# Patient Record
Sex: Female | Born: 1965 | Race: White | Hispanic: No | Smoking: Current every day smoker
Health system: Southern US, Community
[De-identification: ages and names within clinical notes are randomized; demographics above are authoritative.]

## PROBLEM LIST (undated history)

## (undated) ENCOUNTER — Emergency Department (HOSPITAL_COMMUNITY): Discharge status: Absent without leave | Payer: Self-pay

## (undated) DIAGNOSIS — R112 Nausea with vomiting, unspecified: Secondary | ICD-10-CM

## (undated) DIAGNOSIS — R519 Headache, unspecified: Secondary | ICD-10-CM

## (undated) DIAGNOSIS — K589 Irritable bowel syndrome without diarrhea: Secondary | ICD-10-CM

## (undated) DIAGNOSIS — I959 Hypotension, unspecified: Secondary | ICD-10-CM

## (undated) DIAGNOSIS — M1990 Unspecified osteoarthritis, unspecified site: Secondary | ICD-10-CM

## (undated) DIAGNOSIS — F419 Anxiety disorder, unspecified: Secondary | ICD-10-CM

## (undated) DIAGNOSIS — G8929 Other chronic pain: Secondary | ICD-10-CM

## (undated) DIAGNOSIS — F32A Depression, unspecified: Secondary | ICD-10-CM

## (undated) DIAGNOSIS — F329 Major depressive disorder, single episode, unspecified: Secondary | ICD-10-CM

## (undated) DIAGNOSIS — R0600 Dyspnea, unspecified: Secondary | ICD-10-CM

## (undated) DIAGNOSIS — I341 Nonrheumatic mitral (valve) prolapse: Secondary | ICD-10-CM

## (undated) DIAGNOSIS — M549 Dorsalgia, unspecified: Secondary | ICD-10-CM

## (undated) HISTORY — DX: Unspecified osteoarthritis, unspecified site: M19.90

## (undated) HISTORY — PX: TUBAL LIGATION: SHX77

## (undated) HISTORY — DX: Hypotension, unspecified: I95.9

## (undated) HISTORY — DX: Major depressive disorder, single episode, unspecified: F32.9

## (undated) HISTORY — DX: Depression, unspecified: F32.A

## (undated) HISTORY — DX: Nonrheumatic mitral (valve) prolapse: I34.1

## (undated) HISTORY — DX: Other chronic pain: G89.29

## (undated) HISTORY — DX: Anxiety disorder, unspecified: F41.9

## (undated) HISTORY — DX: Nausea with vomiting, unspecified: R11.2

## (undated) HISTORY — DX: Dorsalgia, unspecified: M54.9

---

## 2005-05-19 ENCOUNTER — Emergency Department: Payer: Self-pay | Admitting: Emergency Medicine

## 2006-03-16 ENCOUNTER — Emergency Department: Payer: Self-pay | Admitting: Emergency Medicine

## 2006-07-07 ENCOUNTER — Emergency Department: Payer: Self-pay | Admitting: Emergency Medicine

## 2006-07-16 ENCOUNTER — Ambulatory Visit: Payer: Self-pay | Admitting: Internal Medicine

## 2006-07-31 ENCOUNTER — Ambulatory Visit: Payer: Self-pay | Admitting: Vascular Surgery

## 2006-08-19 ENCOUNTER — Ambulatory Visit: Payer: Self-pay | Admitting: Vascular Surgery

## 2007-05-11 ENCOUNTER — Ambulatory Visit: Payer: Self-pay | Admitting: Gastroenterology

## 2007-07-19 ENCOUNTER — Emergency Department: Payer: Self-pay

## 2009-05-21 ENCOUNTER — Emergency Department: Payer: Self-pay | Admitting: Emergency Medicine

## 2009-11-17 ENCOUNTER — Ambulatory Visit: Payer: Self-pay | Admitting: Family Medicine

## 2011-07-14 ENCOUNTER — Emergency Department: Payer: Self-pay | Admitting: Emergency Medicine

## 2012-10-07 ENCOUNTER — Emergency Department (HOSPITAL_COMMUNITY)
Admission: EM | Admit: 2012-10-07 | Discharge: 2012-10-07 | Disposition: A | Discharge status: Home / Self Care | Payer: Self-pay | Source: Home / Self Care

## 2012-10-07 ENCOUNTER — Encounter (HOSPITAL_COMMUNITY): Payer: Self-pay

## 2012-10-07 DIAGNOSIS — M549 Dorsalgia, unspecified: Secondary | ICD-10-CM

## 2012-10-07 DIAGNOSIS — M25512 Pain in left shoulder: Secondary | ICD-10-CM

## 2012-10-07 DIAGNOSIS — G8929 Other chronic pain: Secondary | ICD-10-CM

## 2012-10-07 DIAGNOSIS — F17200 Nicotine dependence, unspecified, uncomplicated: Secondary | ICD-10-CM

## 2012-10-07 DIAGNOSIS — M25571 Pain in right ankle and joints of right foot: Secondary | ICD-10-CM

## 2012-10-07 MED ORDER — BETAMETHASONE DIPROPIONATE 0.05 % EX CREA: TOPICAL_CREAM | Freq: Two times a day (BID) | CUTANEOUS | Status: DC

## 2012-10-07 MED ORDER — DICLOFENAC SODIUM 1 % TD GEL: 2.0000 g | Freq: Four times a day (QID) | TRANSDERMAL | Status: DC

## 2012-10-07 MED ORDER — IBUPROFEN 200 MG PO TABS: 400.0000 mg | ORAL_TABLET | Freq: Four times a day (QID) | ORAL | Status: DC | PRN

## 2012-10-07 NOTE — ED Notes (Signed)
Patient left one of her prescriptions here tried to call into the mental health pharmacy they were already closed for the day.  We call again in the am when they are open

## 2012-10-07 NOTE — ED Provider Notes (Signed)
History     CSN: [id number]  Arrival date & time 10/07/12  1555   First MD Initiated Contact with Patient 10/07/12 1610      Chief Complaint  Patient presents with  . Back Pain  . Wrist Pain  . Generalized Body Aches  . Rash    r shoulder    (Consider location/radiation/quality/duration/timing/severity/associated sxs/prior treatment) HPI 46 year old female with history of depression following at mental health and with symptoms of low back pain, left shoulder pain and bilateral ankle pain for previous dictations. Patient informs that she had stent bilateral ankle fracture from a fall and had cast applied 2 years back. Since then she has been having off and on ankle pains with some x-ray gait difficulty. She also informs off low back pain sustained over a year ago while at the infarct. She also informs having left shoulder pain with difficulty raising it although it up that's ongoing for past one month. She denies any fever, chills, headache, blurry vision, chest pain, palpitations, nausea, vomiting, abdominal pain, shortness of breath, bowel or urinary symptoms. Denies tingling or numbness of the extremities. Denies any weakness of the limbs. She denies taking any medications for the pain.  Special history: History of depression follows at mental health History reviewed. No pertinent past surgical history.  No family history on file.  Social history: smokes 1 pack per of cigarette per day. Denies alcohol use  OB History    Grav Para Term Preterm Abortions TAB SAB Ect Mult Living                  Review of Systems  Allergies  Review of patient's allergies indicates no known allergies.  Home Medications   Current Outpatient Rx  Name  Route  Sig  Dispense  Refill  . DICLOFENAC SODIUM 1 % TD GEL   Topical   Apply 2 g topically 4 (four) times daily.   50 g   2   . IBUPROFEN 200 MG PO TABS   Oral   Take 2 tablets (400 mg total) by mouth every 6 (six) hours as needed for  pain.   30 tablet   0     BP 122/76  Pulse 90  Temp 98.5 F (36.9 C) (Oral)  Resp 20  SpO2 98%  Physical Exam Middle aged female in no acute distress HEENT: No pallor, moist oral mucosa Chest: Clear to sluggish bilaterally CVS: Normal S1 and S2 no murmurs Abdomen: Soft, nontender, nondistended Extremities: Warm, no edema, small macular rash over left shoulder and under the chin Tender to palpation over bilateral ankle joints. No footdrop, normal range of motion. No swelling or edema noted. Mild tenderness to palpation over lower sacral area. No SL RT. Normal range of motion. Patient has some difficulty raising the left arm above the shoulder.: Normal range of motion. No tenderness or swelling foot CNS: AAO x3 ED Course  Procedures (including critical care time)  Labs Reviewed - No data to display No results found.   1. Back pain, chronic   2. Bilateral ankle pain   3. Left shoulder pain   4. Tobacco use disorder    Multiple pain symptoms involving lower back, bilateral ankles and left shoulder obvious duration. Pain is clearly muscular skeletal in nature, most of them being chronic. We'll try and then admitted he and diclofenac gel for symptomatically. Informed on applying heat compress over the area.  Also counseled on smoking cessation. Counseled on exercise and proper posture to  avoid further back injury. Counseled on avoiding lifting heavy weights.  Can follow up in the clinic in 2 weeks if symptoms not improved or worsening.    MDM  Followup in 2 weeks in the clinic if symptoms not improved or worsening or if symptoms progressively worsened or severe she should go to the emergency .        Eddie North, MD 10/07/12 [phone]

## 2012-10-07 NOTE — ED Notes (Signed)
Patient presents with generalized pain from previous bone fractures.  C/O pain in back wrist both legs and has presently gained weight.  Also complains of rash to Right shoulder

## 2012-10-08 NOTE — ED Notes (Signed)
Spoke with chris @ mental health pharmacy [phone] rx for (Diprolene 0.05%) cream  Given verbally via phone conversation

## 2012-10-22 ENCOUNTER — Emergency Department (INDEPENDENT_AMBULATORY_CARE_PROVIDER_SITE_OTHER): Payer: Self-pay

## 2012-10-22 ENCOUNTER — Emergency Department (HOSPITAL_COMMUNITY)
Admission: EM | Admit: 2012-10-22 | Discharge: 2012-10-22 | Disposition: A | Discharge status: Home / Self Care | Payer: Self-pay | Source: Home / Self Care

## 2012-10-22 ENCOUNTER — Encounter (HOSPITAL_COMMUNITY): Payer: Self-pay

## 2012-10-22 DIAGNOSIS — M549 Dorsalgia, unspecified: Secondary | ICD-10-CM

## 2012-10-22 DIAGNOSIS — G8929 Other chronic pain: Secondary | ICD-10-CM

## 2012-10-22 DIAGNOSIS — R21 Rash and other nonspecific skin eruption: Secondary | ICD-10-CM

## 2012-10-22 LAB — TSH: TSH: 2.18 u[IU]/mL (ref 0.350–4.500)

## 2012-10-22 MED ORDER — ACETAMINOPHEN 500 MG PO TABS: 500.0000 mg | ORAL_TABLET | Freq: Four times a day (QID) | ORAL | Status: DC | PRN

## 2012-10-22 MED ORDER — CYCLOBENZAPRINE HCL 5 MG PO TABS: 5.0000 mg | ORAL_TABLET | Freq: Three times a day (TID) | ORAL | Status: DC | PRN

## 2012-10-22 MED ORDER — HYDROCORTISONE 0.5 % EX CREA: TOPICAL_CREAM | Freq: Two times a day (BID) | CUTANEOUS | Status: DC

## 2012-10-22 NOTE — ED Notes (Signed)
Patient states here for follow up for her back pain-ibuprofen is not helping her discomfort

## 2012-10-22 NOTE — ED Provider Notes (Addendum)
Patient Demographics  Karen Edwards, is a 46 y.o. female  ZOX:[id number]  [id number]  DOB - 10/24/1966  Chief Complaint  Patient presents with  . Follow-up      Followup of back pain  Subjective:   Karen Edwards today came for followup of back pain. Patient says that she was sent on ibuprofen and it did not help the back pain she still has the pain which gets better when she lies down and gets worse when she is walking. Patient says that back pain has been present for many years. Patient was prescribed steroid cream before but she did not use for the rash as it was expensive. Patient denies any itching in the rash. She denies bowel or bladder dysfunction  Objective:    Filed Vitals:   10/22/12 1020  BP: 112/68  Pulse: 75  Temp: 98.7 F (37.1 C)  TempSrc: Oral  Resp: 19  SpO2: 99%     Exam  Awake Alert, Oriented X 3, No new F.N deficits, Normal affect [place].AT,PERRAL Supple Neck,No JVD, No cervical lymphadenopathy appriciated.  Symmetrical Chest wall movement, Good air movement bilaterally, CTAB RRR,No Gallops,Rubs or new Murmurs, No Parasternal Heave +ve B.Sounds, Abd Soft, Non tender, No organomegaly appriciated, No rebound - guarding or rigidity. No Cyanosis, Clubbing or edema,  Back: Patient has positive tenderness in the paraspinal region of the lower lumbar spine area. Skin: Patient has a ring shaped erythematous rash in the right shoulder area  Review of Systems  Constitutional: Negative for fever.  Genitourinary: Negative for dysuria, urgency and frequency.  Musculoskeletal: Positive for back pain.  Skin: Positive for rash.  Neurological: Negative for tingling and sensory change.  Him him him him him  Data Review   CBC No results found for this basename: WBC:5,HGB:5,HCT:5,PLT:5,MCV:5,MCH:5,MCHC:5,RDW:5,NEUTRABS:5,LYMPHSABS:5,MONOABS:5,EOSABS:5,BASOSABS:5,BANDABS:5,BANDSABD:5 in the last 168 hours  Chemistries   No results found for this basename:  NA:5,K:5,CL:5,CO2:5,GLUCOSE:5,BUN:5,CREATININE:5,GFRCGP,:5,CALCIUM:5,MG:5,AST:5,ALT:5,ALKPHOS:5,BILITOT:5 in the last 168 hours ------------------------------------------------------------------------------------------------------------------ No results found for this basename: HGBA1C:2 in the last 72 hours ------------------------------------------------------------------------------------------------------------------ No results found for this basename: CHOL:2,HDL:2,LDLCALC:2,TRIG:2,CHOLHDL:2,LDLDIRECT:2 in the last 72 hours ------------------------------------------------------------------------------------------------------------------ No results found for this basename: TSH,T4TOTAL,FREET3,T3FREE,THYROIDAB in the last 72 hours ------------------------------------------------------------------------------------------------------------------ No results found for this basename: VITAMINB12:2,FOLATE:2,FERRITIN:2,TIBC:2,IRON:2,RETICCTPCT:2 in the last 72 hours  Coagulation profile  No results found for this basename: INR:5,PROTIME:5 in the last 168 hours     Prior to Admission medications   Medication Sig Start Date End Date Taking? Authorizing Provider  acetaminophen (TYLENOL) 500 MG tablet Take 1 tablet (500 mg total) by mouth every 6 (six) hours as needed for pain. 10/22/12   Meredeth Ide, MD  cyclobenzaprine (FLEXERIL) 5 MG tablet Take 1 tablet (5 mg total) by mouth 3 (three) times daily as needed for muscle spasms. 10/22/12   Meredeth Ide, MD  diclofenac sodium (VOLTAREN) 1 % GEL Apply 2 g topically 4 (four) times daily. 10/07/12   Nishant Dhungel, MD  hydrocortisone cream 0.5 % Apply topically 2 (two) times daily. 10/22/12   Meredeth Ide, MD     Assessment & Plan   Chronic back pain Allergic skin rash  Chronic back pain Will obtain x-ray of the lumbar spine I have stopped the ibuprofen and started her on Flexeril 5 mg 3 times a day when necessary and Tylenol when  necessary.  Allergic skin rash Most likely the skin rash appears allergy patient did not use the Diprolene as it was expensive. I was started patient hydrocortisone 0.5% to apply topically twice a day for the rash. The rash  does not appear to be fungal in nature.  Labwork Will obtain TSH as patient is concerned that patient's mother has history of hypothyroidism.  Review of Systems  Constitutional: Negative for fever.  Genitourinary: Negative for dysuria, urgency and frequency.  Musculoskeletal: Positive for back pain.  Skin: Positive for rash.  Neurological: Negative for tingling and sensory change.      Follow-up Information    Follow up with Adult care center. In 2 weeks.          Meredeth Ide M.D on 10/22/2012 at 10:58 AM  Meredeth Ide, MD 10/22/12 1108 he in the portals with  Meredeth Ide, MD 10/22/12 1114  Meredeth Ide, MD 10/22/12 1118

## 2012-10-23 ENCOUNTER — Telehealth (HOSPITAL_COMMUNITY): Payer: Self-pay

## 2012-11-26 ENCOUNTER — Emergency Department (INDEPENDENT_AMBULATORY_CARE_PROVIDER_SITE_OTHER)
Admission: EM | Admit: 2012-11-26 | Discharge: 2012-11-26 | Disposition: A | Discharge status: Home / Self Care | Payer: Self-pay | Source: Home / Self Care | Attending: Family Medicine | Admitting: Family Medicine

## 2012-11-26 ENCOUNTER — Encounter (HOSPITAL_COMMUNITY): Payer: Self-pay

## 2012-11-26 DIAGNOSIS — M5430 Sciatica, unspecified side: Secondary | ICD-10-CM

## 2012-11-26 DIAGNOSIS — M545 Low back pain: Secondary | ICD-10-CM

## 2012-11-26 DIAGNOSIS — G8929 Other chronic pain: Secondary | ICD-10-CM

## 2012-11-26 MED ORDER — KETOROLAC TROMETHAMINE 60 MG/2ML IM SOLN
INTRAMUSCULAR | Status: AC
  Filled 2012-11-26: qty 2

## 2012-11-26 MED ORDER — PREDNISONE 50 MG PO TABS: ORAL_TABLET | ORAL | Status: DC

## 2012-11-26 MED ORDER — METHOCARBAMOL 500 MG PO TABS: 500.0000 mg | ORAL_TABLET | Freq: Four times a day (QID) | ORAL | Status: DC

## 2012-11-26 MED ORDER — HYDROCODONE-ACETAMINOPHEN 5-325 MG PO TABS: 1.0000 | ORAL_TABLET | Freq: Four times a day (QID) | ORAL | Status: DC | PRN

## 2012-11-26 MED ORDER — KETOROLAC TROMETHAMINE 60 MG/2ML IM SOLN
60.0000 mg | Freq: Once | INTRAMUSCULAR | Status: AC
  Administered 2012-11-26: 60 mg via INTRAMUSCULAR

## 2012-11-26 NOTE — ED Notes (Signed)
Patient states still having back pain.  Also has cough congestion headache for 3 days

## 2012-11-26 NOTE — ED Provider Notes (Addendum)
History    CSN: [id number]  Arrival date & time 11/26/12  1126   First MD Initiated Contact with Patient 11/26/12 1142     Chief Complaint  Patient presents with  . Back Pain   HPI  Pt says that she has chronic back pain from an old accident years ago.  She says that she was seen here and started on flexeril by Dr. Sharl Ma but it didn't help.  Pt says her pain has been getting worse over the years.   Pt says that sitting for long periods of time have caused more pain and certain movements cause stabbing pain in the back.  Pt says no loss of bowel or bladder control.  Pt says that it is getting worse and worse.  Pt had a recent xray of her back and it was negative for bony abnormalities.  Pt says that she did have an MRI of her lumbar spine years ago and it revealed that she had some bulging discs in the lumbar spine.   Pt says that she is in so much pain that she wants to go ahead and proceed with another MRI study.     History reviewed. No pertinent past medical history.  History reviewed. No pertinent past surgical history.  No family history on file.  History  Substance Use Topics  . Smoking status: Not on file  . Smokeless tobacco: Not on file  . Alcohol Use: Not on file    OB History    Grav Para Term Preterm Abortions TAB SAB Ect Mult Living                 Review of Systems  HENT: Positive for congestion, rhinorrhea, sneezing and postnasal drip.   Respiratory: Positive for cough.   Musculoskeletal: Positive for back pain, arthralgias and gait problem.  All other systems reviewed and are negative.    Allergies  Review of patient's allergies indicates no known allergies.  Home Medications   Current Outpatient Rx  Name  Route  Sig  Dispense  Refill  . ACETAMINOPHEN 500 MG PO TABS   Oral   Take 1 tablet (500 mg total) by mouth every 6 (six) hours as needed for pain.   30 tablet   0   . CYCLOBENZAPRINE HCL 5 MG PO TABS   Oral   Take 1 tablet (5 mg total) by mouth  3 (three) times daily as needed for muscle spasms.   30 tablet   0     Can make you drowsy, so do not operate heavy machi ...   . DICLOFENAC SODIUM 1 % TD GEL   Topical   Apply 2 g topically 4 (four) times daily.   50 g   2   . HYDROCORTISONE 0.5 % EX CREA   Topical   Apply topically 2 (two) times daily.   30 g   0    BP 121/100  Pulse 90  Temp 99.3 F (37.4 C) (Oral)  Resp 16  SpO2 99%  Physical Exam  Nursing note and vitals reviewed. Constitutional: She is oriented to person, place, and time. She appears well-developed and well-nourished. No distress.  HENT:  Head: Normocephalic and atraumatic.  Nose: Mucosal edema and rhinorrhea present.  Eyes: Conjunctivae normal and EOM are normal. Pupils are equal, round, and reactive to light.  Neck: Normal range of motion. Neck supple. Thyromegaly present.  Cardiovascular: Normal rate, regular rhythm and normal heart sounds.   Abdominal: Soft. Bowel sounds are  normal. She exhibits no distension and no mass. There is no tenderness. There is no rebound and no guarding.  Musculoskeletal: Normal range of motion. She exhibits no edema and no tenderness.  Lymphadenopathy:    She has no cervical adenopathy.  Neurological: She is alert and oriented to person, place, and time. She has normal reflexes. No cranial nerve deficit. She exhibits normal muscle tone. Coordination normal.       Negative straight leg raise    Skin: Skin is warm and dry.  Psychiatric: She has a normal mood and affect. Her behavior is normal. Judgment and thought content normal.    ED Course  Procedures (including critical care time)  Labs Reviewed - No data to display No results found.  No diagnosis found.  MDM  IMPRESSION  Sciatica  Chronic LBP with radiculopathy  Allergic Rhinitis  Tobacco use  RECOMMENDATIONS / PLAN Prednisone 50 mg take 1 po daily # 7 Hydrocortisone 5/325 prn severe pain Robaxin 500 mg QID  Back exercises  Toradol 60 mg  IM given in office Pt declined to pursue PT Try to get MRI study done (pt does not have any medical insurance benefits) Ordered MRI Lumbar Spine The patient was counseled on the dangers of tobacco use, and was advised to quit.  Reviewed strategies to maximize success, including removing cigarettes and smoking materials from environment, stress management and substitution of other forms of reinforcement.  FOLLOW UP 2 weeks   The patient was given clear instructions to go to ER or return to medical center if symptoms don't improve, worsen or new problems develop.  The patient verbalized understanding.  The patient was told to call to get lab results if they haven't heard anything in the next week.            Cleora Fleet, MD 11/26/12 1454  Cleora Fleet, MD 11/26/12 1454  Cleora Fleet, MD 11/26/12 1455

## 2012-11-30 NOTE — ED Notes (Signed)
Mri appt scheduled for Thursday 12/03/2012 @10am 

## 2012-12-03 ENCOUNTER — Ambulatory Visit (HOSPITAL_COMMUNITY)
Admission: RE | Admit: 2012-12-03 | Discharge: 2012-12-03 | Disposition: A | Discharge status: Home / Self Care | Payer: Self-pay | Source: Ambulatory Visit | Attending: Family Medicine | Admitting: Family Medicine

## 2012-12-03 DIAGNOSIS — M545 Low back pain, unspecified: Secondary | ICD-10-CM | POA: Insufficient documentation

## 2012-12-03 DIAGNOSIS — M5126 Other intervertebral disc displacement, lumbar region: Secondary | ICD-10-CM | POA: Insufficient documentation

## 2012-12-03 DIAGNOSIS — G8929 Other chronic pain: Secondary | ICD-10-CM | POA: Insufficient documentation

## 2012-12-03 MED ORDER — GADOBENATE DIMEGLUMINE 529 MG/ML IV SOLN
14.0000 mL | Freq: Once | INTRAVENOUS | Status: AC | PRN
  Administered 2012-12-03: 14 mL via INTRAVENOUS

## 2012-12-07 NOTE — Progress Notes (Signed)
Pt was notified of her MRI L spine results.    Rodney Langton, MD, CDE, FAAFP [facility] [place], [place]

## 2012-12-24 ENCOUNTER — Encounter (HOSPITAL_COMMUNITY): Payer: Self-pay

## 2012-12-24 ENCOUNTER — Emergency Department (HOSPITAL_COMMUNITY)
Admission: EM | Admit: 2012-12-24 | Discharge: 2012-12-24 | Disposition: A | Discharge status: Home / Self Care | Payer: Self-pay | Source: Home / Self Care

## 2012-12-24 DIAGNOSIS — M549 Dorsalgia, unspecified: Secondary | ICD-10-CM

## 2012-12-24 MED ORDER — METHOCARBAMOL 500 MG PO TABS: 500.0000 mg | ORAL_TABLET | Freq: Four times a day (QID) | ORAL | Status: DC

## 2012-12-24 MED ORDER — LIDOCAINE 5 % EX PTCH: 1.0000 | MEDICATED_PATCH | CUTANEOUS | Status: DC

## 2012-12-24 NOTE — ED Notes (Signed)
Follow up back pain

## 2012-12-24 NOTE — ED Provider Notes (Signed)
Patient Demographics  Karen Edwards, is a 47 y.o. female  [id number]  [id number]  DOB - [birth year]  Chief Complaint  Patient presents with  . Follow-up        Subjective:   Karen Edwards with back pain who presents to day for follow up visit. MRI done on 1/30 was reviewed with pt and it revealed Small right paracentral disc protrusion at L1-L2, Mild disc bulging and facet disease from L2-L3 through L4-L5. No significant spinal stenosis or nerve root encroachment. She states she still has back pain and the vicodin gets the pain down to about level 6.she denies any leg weakness or parasthesias.   today has, No headache, No chest pain, No abdominal pain - No Nausea, No new weakness tingling or numbness, No Cough - SOB.   Objective:    Filed Vitals:   12/24/12 1413  Temp: 98.8 F (37.1 C)     Exam  Awake Alert, Oriented X 3, No new F.N deficits, Normal affect [place].AT,PERRAL Supple Neck,No JVD, No cervical lymphadenopathy appriciated.  Symmetrical Chest wall movement, Good air movement bilaterally, CTAB RRR,No Gallops,Rubs or new Murmurs, No Parasternal Heave +ve B.Sounds, Abd Soft, Non tender, No organomegaly appriciated, No rebound - guarding or rigidity. No Cyanosis, Clubbing or edema, No new Rash or bruise   Neuro cranial nerves 2-12 grossly intact, normal strength, sensory grossly intact,nonfocal    Data Review   CBC No results found for this basename: WBC, HGB, HCT, PLT, MCV, MCH, MCHC, RDW, NEUTRABS, LYMPHSABS, MONOABS, EOSABS, BASOSABS, BANDABS, BANDSABD,  in the last 168 hours  Chemistries   No results found for this basename: NA, K, CL, CO2, GLUCOSE, BUN, CREATININE, GFRCGP, CALCIUM, MG, AST, ALT, ALKPHOS, BILITOT,  in the last 168 hours ------------------------------------------------------------------------------------------------------------------ No results found for this basename: HGBA1C,  in the last 72  hours ------------------------------------------------------------------------------------------------------------------ No results found for this basename: CHOL, HDL, LDLCALC, TRIG, CHOLHDL, LDLDIRECT,  in the last 72 hours ------------------------------------------------------------------------------------------------------------------ No results found for this basename: TSH, T4TOTAL, FREET3, T3FREE, THYROIDAB,  in the last 72 hours ------------------------------------------------------------------------------------------------------------------ No results found for this basename: VITAMINB12, FOLATE, FERRITIN, TIBC, IRON, RETICCTPCT,  in the last 72 hours  Coagulation profile  No results found for this basename: INR, PROTIME,  in the last 168 hours     Prior to Admission medications   Medication Sig Start Date End Date Taking? Authorizing Provider  HYDROcodone-acetaminophen (NORCO/VICODIN) 5-325 MG per tablet Take 1 tablet by mouth every 6 (six) hours as needed for pain (severe back pain ). 11/26/12   Clanford Cyndie Mull, MD  lidocaine (LIDODERM) 5 % Place 1 patch onto the skin daily. Remove & Discard patch within 12 hours or as directed by MD 12/24/12   Kela Millin, MD  methocarbamol (ROBAXIN) 500 MG tablet Take 1 tablet (500 mg total) by mouth 4 (four) times daily. 12/24/12   Kela Millin, MD  predniSONE (DELTASONE) 50 MG tablet Take 1 po once daily 11/26/12   Clanford Cyndie Mull, MD     Assessment & Plan   Low back pain/L1-L2small right paracentral disc protrusion and mild disc bulging and facet disease L1-5  -given that is mild bulging discs surgical intervention likely not indicated  -I have discussed Lidoderm patches with patient and she agrees to try them if she can get the filled at [facility] have given her a prescription for it. -refill Robaxin,and I have referred patient that to the pain clinic -anti-inflammatories as needed  Follow-up Information   Schedule an  appointment as soon as possible for a visit in 1 month to follow up. (or as needed)        Dhanvi Boesen C M.D on 12/24/2012 at 3:25 PM   Kela Millin, MD 12/24/12 1539

## 2013-01-19 NOTE — ED Notes (Signed)
Patient had an appt for [facility]- pt did not have money for the self pay visit

## 2013-02-01 ENCOUNTER — Encounter (HOSPITAL_COMMUNITY): Payer: Self-pay

## 2013-02-01 ENCOUNTER — Emergency Department (HOSPITAL_COMMUNITY)
Admission: EM | Admit: 2013-02-01 | Discharge: 2013-02-01 | Disposition: A | Discharge status: Home / Self Care | Payer: Self-pay | Source: Home / Self Care | Attending: Family Medicine | Admitting: Family Medicine

## 2013-02-01 DIAGNOSIS — M545 Low back pain, unspecified: Secondary | ICD-10-CM

## 2013-02-01 DIAGNOSIS — R5383 Other fatigue: Secondary | ICD-10-CM | POA: Diagnosis present

## 2013-02-01 DIAGNOSIS — D219 Benign neoplasm of connective and other soft tissue, unspecified: Secondary | ICD-10-CM | POA: Diagnosis present

## 2013-02-01 DIAGNOSIS — G8929 Other chronic pain: Secondary | ICD-10-CM

## 2013-02-01 DIAGNOSIS — M5442 Lumbago with sciatica, left side: Secondary | ICD-10-CM | POA: Diagnosis present

## 2013-02-01 DIAGNOSIS — N920 Excessive and frequent menstruation with regular cycle: Secondary | ICD-10-CM | POA: Diagnosis present

## 2013-02-01 LAB — COMPREHENSIVE METABOLIC PANEL
ALT: 12 U/L (ref 0–35)
AST: 15 U/L (ref 0–37)
Albumin: 4.2 g/dL (ref 3.5–5.2)
CO2: 27 mEq/L (ref 19–32)
Chloride: 104 mEq/L (ref 96–112)
GFR calc non Af Amer: >90 mL/min (ref >90)
Potassium: 4.6 mEq/L (ref 3.5–5.1)
Sodium: 140 mEq/L (ref 135–145)
Total Bilirubin: 0.1 mg/dL — ABNORMAL LOW (ref 0.3–1.2)

## 2013-02-01 LAB — CBC
Platelets: 215 10*3/uL (ref 150–400)
RBC: 4.18 MIL/uL (ref 3.87–5.11)
RDW: 13.9 % (ref 11.5–15.5)
WBC: 10.3 10*3/uL (ref 4.0–10.5)

## 2013-02-01 MED ORDER — HYDROCODONE-ACETAMINOPHEN 5-325 MG PO TABS: 1.0000 | ORAL_TABLET | Freq: Four times a day (QID) | ORAL | Status: DC | PRN

## 2013-02-01 MED ORDER — METHOCARBAMOL 500 MG PO TABS: 500.0000 mg | ORAL_TABLET | Freq: Four times a day (QID) | ORAL | Status: DC | PRN

## 2013-02-01 NOTE — ED Notes (Signed)
Follow up back pain

## 2013-02-01 NOTE — ED Provider Notes (Signed)
History     CSN: [id number]  Arrival date & time 02/01/13  1137   First MD Initiated Contact with Patient 02/01/13 1320      Chief Complaint  Patient presents with  . Follow-up    HPI Pt reports that she was not able to see the pain management clinic.  The patient reports that she could not afford to pay the 150 dollars upfront that they were asking.  She has uterine fibroids and heavy menstrual bleeding.  She doesn't want to try birth control pills at this time. She needs her CBC checked today.  She reporting that she would like to see a gynecologist.    History reviewed. No pertinent past medical history.  History reviewed. No pertinent past surgical history.  No family history on file.  History  Substance Use Topics  . Smoking status: Not on file  . Smokeless tobacco: Not on file  . Alcohol Use: Not on file    OB History   Grav Para Term Preterm Abortions TAB SAB Ect Mult Living                  Review of Systems Constitutional: fatigue .  HENT: Negative.  Respiratory: Negative.  Cardiovascular: Negative.  Gastrointestinal: Negative.  Endocrine: Negative.  Genitourinary: heavy menstrual periods .  Musculoskeletal: Negative.  Skin: Negative.  Allergic/Immunologic: Negative.  Neurological: Negative.  Hematological: Negative.  Psychiatric/Behavioral: Negative.  All other systems reviewed and are negative   Allergies  Review of patient's allergies indicates no known allergies.  Home Medications   Current Outpatient Rx  Name  Route  Sig  Dispense  Refill  . HYDROcodone-acetaminophen (NORCO/VICODIN) 5-325 MG per tablet   Oral   Take 1 tablet by mouth every 6 (six) hours as needed for pain (severe back pain ).   20 tablet   0   . lidocaine (LIDODERM) 5 %   Transdermal   Place 1 patch onto the skin daily. Remove & Discard patch within 12 hours or as directed by MD   30 patch   0   . methocarbamol (ROBAXIN) 500 MG tablet   Oral   Take 1 tablet (500 mg  total) by mouth 4 (four) times daily.   30 tablet   0   . predniSONE (DELTASONE) 50 MG tablet      Take 1 po once daily   10 tablet   0     BP 105/57  Pulse 64  Temp(Src) 97.9 F (36.6 C) (Oral)  SpO2 100%  Physical Exam Nursing note and vitals reviewed.  Constitutional: She is oriented to person, place, and time. She appears well-developed and well-nourished. No distress.  HENT:  Head: Normocephalic and atraumatic.  Eyes: Conjunctivae and EOM are normal. Pupils are equal, round, and reactive to light.  Neck: Normal range of motion. Neck supple. No JVD present. No tracheal deviation present. No thyromegaly present.  Cardiovascular: Normal rate, regular rhythm and normal heart sounds.  Pulmonary/Chest: Effort normal and breath sounds normal. No respiratory distress. She has no wheezes.  Abdominal: Soft. Bowel sounds are normal.  Musculoskeletal: tenderness of lumbar spine with palpation and tight paraspinal muscles, Normal range of motion. She exhibits no edema and no tenderness.  Lymphadenopathy:  She has no cervical adenopathy.  Neurological: She is alert and oriented to person, place, and time. She has normal reflexes.  Skin: Skin is warm and dry.  Psychiatric: She has a normal mood and affect. Her behavior is normal. Judgment and thought  content normal.   ED Course  Procedures (including critical care time)  Labs Reviewed  CBC  COMPREHENSIVE METABOLIC PANEL   No results found.   No diagnosis found.   MDM  IMPRESSION  Chronic LBP  Severe fatigue  Menorrhagia  Uterine fibroids   RECOMMENDATIONS / PLAN Check CBC Refill pain pain meds for 1 month until pt can get established with pain mgmt  Follow lab results  Referral to [facility]  FOLLOW UP 3 months   The patient was given clear instructions to go to ER or return to medical center if symptoms don't improve, worsen or new problems develop.  The patient verbalized understanding.  The patient was  told to call to get lab results if they haven't heard anything in the next week.            Cleora Fleet, MD 02/01/13 1338

## 2013-02-01 NOTE — Progress Notes (Signed)
Quick Note:  Please inform patient that labs came back OK.   C. L. Johnson, MD, CDE, FAAFP [facility] [place], [place]   ______ 

## 2013-02-02 ENCOUNTER — Telehealth (HOSPITAL_COMMUNITY): Payer: Self-pay

## 2013-02-02 NOTE — ED Notes (Signed)
Spoke with patient lab results given

## 2013-02-03 NOTE — ED Notes (Signed)
[facility]

## 2013-02-08 NOTE — ED Notes (Signed)
Patient has an appt Mar 15, 2013 @ 12:45pm at [facility]

## 2013-03-15 ENCOUNTER — Encounter: Payer: Self-pay | Admitting: Obstetrics & Gynecology

## 2013-05-20 ENCOUNTER — Telehealth: Payer: Self-pay | Admitting: Family Medicine

## 2013-05-20 NOTE — Telephone Encounter (Signed)
Please check on handicap form.  I haven't received it yet.  Please check on medical release form and send her xray results to [facility] as requested.   Rodney Langton, MD, CDE, FAAFP [facility] [place], [place]

## 2013-05-20 NOTE — Telephone Encounter (Signed)
Pt has still not received letter for handicap sticker. Pt says she sent form to filled out about 2 weeks ago.  She was told during visit in April that all she had to do was send [place] the form to be filled out.    [facility] still has not received report of x-ray done of patient's back. Pt has signed and sent another medical release form , have we received it?

## 2013-05-21 ENCOUNTER — Telehealth: Payer: Self-pay | Admitting: *Deleted

## 2013-05-21 NOTE — Telephone Encounter (Signed)
05/21/13  Spoke with patient made aware that X-Ray results have already bee sent over to [facility]. Per [place] . Handicap form was given to Dr.Johnson. P.Athol Bolds,RN BSN [facility]

## 2013-09-02 ENCOUNTER — Other Ambulatory Visit: Payer: Self-pay

## 2013-10-05 ENCOUNTER — Ambulatory Visit: Payer: Self-pay | Admitting: Pain Medicine

## 2013-10-13 ENCOUNTER — Ambulatory Visit: Payer: Self-pay | Admitting: Pain Medicine

## 2013-10-18 ENCOUNTER — Ambulatory Visit: Payer: Self-pay | Admitting: Pain Medicine

## 2013-11-18 ENCOUNTER — Ambulatory Visit: Payer: Self-pay | Admitting: Pain Medicine

## 2013-12-20 ENCOUNTER — Ambulatory Visit: Payer: Self-pay | Admitting: Obstetrics and Gynecology

## 2013-12-23 ENCOUNTER — Ambulatory Visit: Payer: Self-pay | Admitting: Obstetrics and Gynecology

## 2013-12-24 HISTORY — PX: HYSTEROSCOPY WITH D & C: SHX1775

## 2013-12-27 LAB — PATHOLOGY REPORT

## 2014-01-03 ENCOUNTER — Ambulatory Visit: Payer: Self-pay | Admitting: Pain Medicine

## 2014-02-16 ENCOUNTER — Ambulatory Visit: Payer: Self-pay | Admitting: Pain Medicine

## 2014-02-19 ENCOUNTER — Emergency Department: Payer: Self-pay | Admitting: Emergency Medicine

## 2014-02-28 ENCOUNTER — Ambulatory Visit: Payer: Self-pay | Admitting: Anesthesiology

## 2014-03-02 ENCOUNTER — Ambulatory Visit: Payer: Self-pay | Admitting: Specialist

## 2014-03-02 HISTORY — PX: ANKLE SURGERY: SHX546

## 2014-03-23 ENCOUNTER — Emergency Department: Payer: Self-pay

## 2014-04-11 ENCOUNTER — Ambulatory Visit: Payer: Self-pay | Admitting: Pain Medicine

## 2014-05-10 ENCOUNTER — Ambulatory Visit: Payer: Self-pay | Admitting: Pain Medicine

## 2014-05-16 ENCOUNTER — Ambulatory Visit: Payer: Self-pay | Admitting: Pain Medicine

## 2014-07-12 ENCOUNTER — Ambulatory Visit: Payer: Self-pay | Admitting: Pain Medicine

## 2014-07-27 ENCOUNTER — Ambulatory Visit: Payer: Self-pay | Admitting: Pain Medicine

## 2014-08-29 ENCOUNTER — Ambulatory Visit: Payer: Self-pay | Admitting: Pain Medicine

## 2014-09-20 ENCOUNTER — Ambulatory Visit: Payer: Self-pay | Admitting: Family Medicine

## 2014-09-27 ENCOUNTER — Ambulatory Visit: Payer: Self-pay | Admitting: Pain Medicine

## 2014-09-29 ENCOUNTER — Ambulatory Visit: Payer: Self-pay | Admitting: Pain Medicine

## 2014-10-03 ENCOUNTER — Ambulatory Visit: Payer: Self-pay | Admitting: Pain Medicine

## 2014-11-23 ENCOUNTER — Ambulatory Visit: Payer: Self-pay | Admitting: Pain Medicine

## 2014-12-19 ENCOUNTER — Ambulatory Visit: Payer: Self-pay | Admitting: Pain Medicine

## 2015-01-19 ENCOUNTER — Ambulatory Visit: Payer: Self-pay | Admitting: Pain Medicine

## 2015-02-08 ENCOUNTER — Ambulatory Visit
Admit: 2015-02-08 | Disposition: A | Discharge status: Home / Self Care | Payer: Self-pay | Attending: Pain Medicine | Admitting: Pain Medicine

## 2015-02-18 NOTE — Op Note (Signed)
PATIENT NAME:  Karen Edwards, Karen Edwards MR#:  [id number] DATE OF BIRTH:  1966/06/15  DATE OF PROCEDURE:  12/23/2013  PREOPERATIVE DIAGNOSIS: Menorrhagia, possible endometrial polyp on transvaginal ultrasound.  POSTOPERATIVE DIAGNOSIS: Menorrhagia, possible endometrial polyp on transvaginal ultrasound. No evidence of polyp noted.  OPERATION PERFORMED: 1.  Hysteroscopy.  2.  Dilatation and curettage.   SURGEON: Sander Nephew, MD  ANESTHESIA: General.   PREOPERATIVE ANTIBIOTICS: None.   DRAINS OR TUBES: None.   IMPLANTS: None.   ESTIMATED BLOOD LOSS: Minimal.   OPERATIVE FLUIDS: 500 mL.   COMPLICATIONS: None.   FINDINGS: Normal cervix, normal endometrial cavity. No defects or evidence of polyp noted within the cavity.   SPECIMENS REMOVED: Endometrial curettings.  PATIENT CONDITION FOLLOWING PROCEDURE: Stable.   PROCEDURE IN DETAIL: Risks, benefits, and alternatives of the procedure were discussed with the patient prior proceeding to the operating room. The patient was taken to the operating room where she was placed under general endotracheal anesthesia. The patient was positioned in the dorsal lithotomy position utilizing Allen stirrups, prepped and draped in the usual sterile fashion. A timeout procedure was performed. Attention was turned to the patient's pelvis. The bladder was straight cathed with a red rubber catheter. An operative speculum was then placed. The anterior lip of the cervix was grasped with a single-tooth tenaculum and sequentially dilated using Pratt dilators to allow passage of the hysteroscope. Hysteroscopic findings revealed a normal cavity contour without any evidence of polyp or other endometrial defects. The hysteroscope was removed, sharp curettage was performed, and a second-look hysteroscopy was performed noting no further defects in the cavity. The hysteroscope was removed as was the single-tooth tenaculum and operative speculum. Sponge, needle, and instrument  counts were correct x2. The patient tolerated the procedure well and was taken to the recovery room in stable condition.  ____________________________ Stoney Bang. Georgianne Fick, MD ams:sb D: 12/24/2013 08:43:49 ET T: 12/24/2013 11:11:55 ET JOB#: [id number]  cc: Stoney Bang. Georgianne Fick, MD, <Dictator> Conan Bowens Madelon Lips MD ELECTRONICALLY SIGNED 01/18/2014 12:35

## 2015-02-18 NOTE — Op Note (Signed)
PATIENT NAME:  Karen Edwards, Karen Edwards MR#:  [id number] DATE OF BIRTH:  1966/09/16  DATE OF PROCEDURE:  03/02/2014  PREOPERATIVE DIAGNOSES: 1.  Laceration, common extensor tendon, right leg above the ankle.  2.  Laceration, sural nerve, right lower leg.   PREOPERATIVE DIAGNOSES: 1.  Laceration, right sural nerve, right lower leg.  2.  Laceration of deep fascia and contusion of extensor tendon.   OPERATION PERFORMED:  1.  Repair of the sural nerve, right lower leg.  2.  Repair of the deep fascia with irrigation and debridement.   SURGEON: Park Breed, M.D.   ANESTHESIA: General LMA.   COMPLICATIONS: None.   DRAINS: None.   ESTIMATED BLOOD LOSS: None. Replaced: None.    OPERATIVE FINDINGS: The patient had a transverse laceration of the sural nerve about 2 inches above the ankle. The deep fascia was lacerated, but the common extensor and extensor hallucis longus tendons were intact. There was bruising here. No other damage was noted.   OPERATIVE PROCEDURE: The patient was brought to the operating room where she underwent a satisfactory general LMA anesthesia in the supine position. The right lower leg was prepped and draped in sterile fashion after sutures were removed. Esmarch was applied and the tourniquet inflated to 350 mmHg. Tourniquet time was 52 minutes. A previous transverse incision about 2 inches above the right lateral malleolus was reopened, and the skin edges debrided. The wound was extended distally and anteriorly and proximally posteriorly for better visualization. The sural nerve was identified proximally and distally and was seen to be completely lacerated. Beneath this, there was a laceration into the deep fascia over the common extensor to the toes. This was opened further, and the tendon and muscle explored fully. There was some bruising, but the tendon was intact. I was able to grasp the tendon and extend the toes. The extensor hallucis longus was also observed and grasped  and was intact. After irrigation, the sural nerve was repaired with multiple 7-0 nylon sutures using loupe magnification. The fascia was loosely closed with 3-0 Vicryl. The subcutaneous tissue was closed with 4-0 Vicryl, and the skin was closed with 4-0 nylon. 0.5% Marcaine was placed in the wound and a dry sterile dressing with a posterior splint at 90 degrees was applied. The tourniquet was deflated with good return of blood flow to the foot. The patient was awakened and taken to recovery in good condition.    ____________________________ Park Breed, MD hem:dmm D: 03/02/2014 12:19:05 ET T: 03/02/2014 21:05:31 ET JOB#: [id number]  cc: Park Breed, MD, <Dictator> Park Breed MD ELECTRONICALLY SIGNED 03/03/2014 12:55

## 2015-03-07 ENCOUNTER — Encounter: Payer: Self-pay | Admitting: Pain Medicine

## 2015-03-07 ENCOUNTER — Ambulatory Visit: Payer: PPO | Attending: Pain Medicine | Admitting: Pain Medicine

## 2015-03-07 VITALS — BP 114/52 | HR 70 | Temp 98.9°F | Resp 18 | Ht 60.0 in | Wt 130.0 lb

## 2015-03-07 DIAGNOSIS — M5116 Intervertebral disc disorders with radiculopathy, lumbar region: Secondary | ICD-10-CM | POA: Insufficient documentation

## 2015-03-07 DIAGNOSIS — M47816 Spondylosis without myelopathy or radiculopathy, lumbar region: Secondary | ICD-10-CM | POA: Insufficient documentation

## 2015-03-07 DIAGNOSIS — M5136 Other intervertebral disc degeneration, lumbar region: Secondary | ICD-10-CM

## 2015-03-07 DIAGNOSIS — M79605 Pain in left leg: Secondary | ICD-10-CM | POA: Insufficient documentation

## 2015-03-07 DIAGNOSIS — M79604 Pain in right leg: Secondary | ICD-10-CM | POA: Insufficient documentation

## 2015-03-07 DIAGNOSIS — M542 Cervicalgia: Secondary | ICD-10-CM | POA: Diagnosis present

## 2015-03-07 DIAGNOSIS — M5481 Occipital neuralgia: Secondary | ICD-10-CM | POA: Insufficient documentation

## 2015-03-07 DIAGNOSIS — M5030 Other cervical disc degeneration, unspecified cervical region: Secondary | ICD-10-CM

## 2015-03-07 DIAGNOSIS — M545 Low back pain: Secondary | ICD-10-CM | POA: Insufficient documentation

## 2015-03-07 DIAGNOSIS — G894 Chronic pain syndrome: Secondary | ICD-10-CM

## 2015-03-07 DIAGNOSIS — M5416 Radiculopathy, lumbar region: Secondary | ICD-10-CM | POA: Insufficient documentation

## 2015-03-07 MED ORDER — HYDROCODONE-ACETAMINOPHEN 5-325 MG PO TABS: ORAL_TABLET | ORAL | Status: DC

## 2015-03-07 MED ORDER — TIZANIDINE HCL 2 MG PO CAPS: ORAL_CAPSULE | ORAL | Status: DC

## 2015-03-07 NOTE — Progress Notes (Signed)
Patient is a 49 year old female returns to pain management for evaluation of pain involving the lower back and lower extremity region with pain of the neck as well patient describes pain as a sharp shooting pain radiating from the back to the right lower extremity. He is in hopes of being able to undergo interventional treatment to decrease severity of symptoms. Patient with pain of the cervical region as well precipitating headaches. We will consider patient for selective nerve root block lumbar region pending clearance by patient's ophthalmologist and general medical position. Patient is understanding and in agreement with suggested treatment plan  Physical examination  Patient with tenderness to palpation paraspinal musculature region cervical region with tenderness of the splenius capitis and the Cipro tells regions of moderate degree with tenderness over the trapezius musculature region of severe degree. Mild tenderness of the acromioclavicular glenohumeral joint region with unremarkable Spurling's maneuver patient of the thoracic paraspinal musculature region reproduced moderate discomfort especially the lower thoracic region. With tenderness to palpation over the lumbar paraspinal musculature region reproducing severe discomfort with extension and palpation over the lumbar facets reproducing severe pain right greater than the left. There is tenderness of the gluteal and piriformis musculature regions as well. Straight leg raising was tolerated to approximately 30. Note no increased pain with dorsiflexion noted. Abdomen soft nontender without excessive tenderness to palpation and no costovertebral angle tenderness was noted.  Assessment  Patient with lumbar lower extremity pain right greater than left with degenerative disc disease L1-2 level degenerative disc disease with right central disc extrusion cranial migration of disc material   Lumbar radiculopathy  Occipital  neuralgia  Cervicalgia  Sacroiliac joint dysfunction    Plan  Lumbosacral selective nerve root block at time of return appointment  Continue present medications. Patient will begin Zanaflex and continue hydrocodone acetaminophen F/U PCP for evaliation of  BP and general medical  Condition.  F/U surgical evaluation.  F/U neurological evaluation.  May consider radiofrequency rhizolysis or intraspinal procedures pending response to present treatment and F/U evaluation  Patient to call [facility] should patient have concerns prior to scheduled return appointment.Marland Kitchen

## 2015-03-07 NOTE — Progress Notes (Signed)
Patient ambulatory; discharge home at 1000hrs. Procedure info given on selective root  Block and info on radiofrequency  Procedure. Script given on hydrocodone. Teach back 3 done.

## 2015-03-07 NOTE — Patient Instructions (Addendum)
Continue present medications. We began Zanaflex today cautioned patient regarding respiratory depression confusion and other undesirable side effects. Patient will continue hydrocodone as prescribed  Selective nerve root block on Monday, 03/20/2015 pending insurance approval  F/U PCP for evaliation of  BP and general medical  Condition.  F/U surgical evaluation.  F/U nrurological evaluation.  May consider radiofrequency rhizolysis or intraspinal procedures pending response to present treatment and F/U evaluation.  Patient to call [facility] Management Center should patient have concerns prior to scheduled return appointment. Selective Nerve Root Block Patient Information  Description: Specific nerve roots exit the spinal canal and these nerves can be compressed and inflamed by a bulging disc and bone spurs.  By injecting steroids on the nerve root, we can potentially decrease the inflammation surrounding these nerves, which often leads to decreased pain.  Also, by injecting local anesthesia on the nerve root, this can provide [place] helpful information to give to your referring doctor if it decreases your pain.  Selective nerve root blocks can be done along the spine from the neck to the low back depending on the location of your pain.   After numbing the skin with local anesthesia, a small needle is passed to the nerve root and the position of the needle is verified using x-ray pictures.  After the needle is in correct position, we then deposit the medication.  You may experience a pressure sensation while this is being done.  The entire block usually lasts less than 15 minutes.  Conditions that may be treated with selective nerve root blocks:  Low back and leg pain  Spinal stenosis  Diagnostic block prior to potential surgery  Neck and arm pain  Post laminectomy syndrome  Preparation for the injection:   Do not eat any solid food or dairy products within 6 hours of your appointment.  You  may drink clear liquids up to 2 hours before an appointment.  Clear liquids include water, black coffee, juice or soda.  No milk or cream please.  You may take your regular medications, including pain medications, with a sip of water before your appointment.  Diabetics should hold regular insulin (if taken separately) and take 1/2 normal NPH dose the morning of the procedure.  Carry some sugar containing items with you to your appointment.  A driver must accompany you and be prepared to drive you home after your procedure.  Bring all your current medications with you.  An IV may be inserted and sedation may be given at the discretion of the physician.  A blood pressure cuff, EKG, and other monitors will often be applied during the procedure.  Some patients may need to have extra oxygen administered for a short period.  You will be asked to provide medical information, including allergies, prior to the procedure.  We must know immediately if you are taking blood  Thinners (like Coumadin) or if you are allergic to IV iodine contrast (dye).  Possible side-effects: All are usually temporary  Bleeding from needle site  Light headedness  Numbness and tingling  Decreased blood pressure  Weakness in arms/legs  Pressure sensation in back/neck  Pain at injection site (several days)  Possible complications: All are extremely rare  Infection  Nerve injury  Spinal headache (a headache wore with upright position)  Call if you experience:  Fever/chills associated with headache or increased back/neck pain  Headache worsened by an upright position  New onset weakness or numbness of an extremity below the injection site  Hives  or difficulty breathing (go to the emergency room)  Inflammation or drainage at the injection site(s)  Severe back/neck pain greater than usual  New symptoms which are concerning to you  Please note:  Although the local anesthetic injected can often  make your back or neck feel good for several hours after the injection the pain will likely return.  It takes 3-5 days for steroids to work on the nerve root. You may not notice any pain relief for at least one week.  If effective, we will often do a series of 3 injections spaced 3-6 weeks apart to maximally decrease your pain.    If you have any questions, please call [phone] [facility] Clinic[facility] R[facility] AND COMPLICATIONS  What are the risk, side effects and possible complications? Generally speaking, most procedures are safe.  However, with any procedure there are risks, side effects, and the possibility of complications.  The risks and complications are dependent upon the sites that are lesioned, or the type of nerve block to be performed.  The closer the procedure is to the spine, the more serious the risks are.  Great care is taken when placing the radio frequency needles, block needles or lesioning probes, but sometimes complications can occur.  Infection: Any time there is an injection through the skin, there is a risk of infection.  This is why sterile conditions are used for these blocks.  There are four possible types of infection.  Localized skin infection.  Central Nervous System Infection-This can be in the form of Meningitis, which can be deadly.  Epidural Infections-This can be in the form of an epidural abscess, which can cause pressure inside of the spine, causing compression of the spinal cord with subsequent paralysis. This would require an emergency surgery to decompress, and there are no guarantees that the patient would recover from the paralysis.  Discitis-This is an infection of the intervertebral discs.  It occurs in about 1% of discography procedures.  It is difficult to treat and it may lead to surgery.        2. Pain: the needles have to go through skin and soft tissues, will cause soreness.       3. Damage to internal structures:   The nerves to be lesioned may be near blood vessels or    other nerves which can be potentially damaged.       4. Bleeding: Bleeding is more common if the patient is taking blood thinners such as  aspirin, Coumadin, Ticiid, Plavix, etc., or if he/she have some genetic predisposition  such as hemophilia. Bleeding into the spinal canal can cause compression of the spinal  cord with subsequent paralysis.  This would require an emergency surgery to  decompress and there are no guarantees that the patient would recover from the  paralysis.       5. Pneumothorax:  Puncturing of a lung is a possibility, every time a needle is introduced in  the area of the chest or upper back.  Pneumothorax refers to free air around the  collapsed lung(s), inside of the thoracic cavity (chest cavity).  Another two possible  complications related to a similar event would include: Hemothorax and Chylothorax.   These are variations of the Pneumothorax, where instead of air around the collapsed  lung(s), you may have blood or chyle, respectively.       6. Spinal headaches: They may occur with any procedures in the area of the spine.  7. Persistent CSF (Cerebro-Spinal Fluid) leakage: This is a rare problem, but may occur  with prolonged intrathecal or epidural catheters either due to the formation of a fistulous  track or a dural tear.       8. Nerve damage: By working so close to the spinal cord, there is always a possibility of  nerve damage, which could be as serious as a permanent spinal cord injury with  paralysis.       9. Death:  Although rare, severe deadly allergic reactions known as "Anaphylactic  reaction" can occur to any of the medications used.      10. Worsening of the symptoms:  We can always make thing worse.  What are the chances of something like this happening? Chances of any of this occuring are extremely low.  By statistics, you have more of a chance of getting killed in a motor vehicle accident: while  driving to the hospital than any of the above occurring .  Nevertheless, you should be aware that they are possibilities.  In general, it is similar to taking a shower.  Everybody knows that you can slip, hit your head and get killed.  Does that mean that you should not shower again?  Nevertheless always keep in mind that statistics do not mean anything if you happen to be on the wrong side of them.  Even if a procedure has a 1 (one) in a 1,000,000 (million) chance of going wrong, it you happen to be that one..Also, keep in mind that by statistics, you have more of a chance of having something go wrong when taking medications.  Who should not have this procedure? If you are on a blood thinning medication (e.g. Coumadin, Plavix, see list of "Blood Thinners"), or if you have an active infection going on, you should not have the procedure.  If you are taking any blood thinners, please inform your physician.  How should I prepare for this procedure?  Do not eat or drink anything at least six hours prior to the procedure.  Bring a driver with you .  It cannot be a taxi.  Come accompanied by an adult that can drive you back, and that is strong enough to help you if your legs get weak or numb from the local anesthetic.  Take all of your medicines the morning of the procedure with just enough water to swallow them.  If you have diabetes, make sure that you are scheduled to have your procedure done first thing in the morning, whenever possible.  If you have diabetes, take only half of your insulin dose and notify our nurse that you have done so as soon as you arrive at the clinic.  If you are diabetic, but only take blood sugar pills (oral hypoglycemic), then do not take them on the morning of your procedure.  You may take them after you have had the procedure.  Do not take aspirin or any aspirin-containing medications, at least eleven (11) days prior to the procedure.  They may prolong bleeding.  Wear  loose fitting clothing that may be easy to take off and that you would not mind if it got stained with Betadine or blood.  Do not wear any jewelry or perfume  Remove any nail coloring.  It will interfere with some of our monitoring equipment.  NOTE: Remember that this is not meant to be interpreted as a complete list of all possible complications.  Unforeseen problems may occur.  BLOOD THINNERS  The following drugs contain aspirin or other products, which can cause increased bleeding during surgery and should not be taken for 2 weeks prior to and 1 week after surgery.  If you should need take something for relief of minor pain, you may take acetaminophen which is found in Tylenol,m Datril, Anacin-3 and Panadol. It is not blood thinner. The products listed below are.  Do not take any of the products listed below in addition to any listed on your instruction sheet.  A.P.C or A.P.C with Codeine Codeine Phosphate Capsules #3 Ibuprofen Ridaura  ABC compound Congesprin Imuran rimadil  Advil Cope Indocin Robaxisal  Alka-Seltzer Effervescent Pain Reliever and Antacid Coricidin or Coricidin-D  Indomethacin Rufen  Alka-Seltzer plus Cold Medicine Cosprin Ketoprofen S-A-C Tablets  Anacin Analgesic Tablets or Capsules Coumadin Korlgesic Salflex  Anacin Extra Strength Analgesic tablets or capsules CP-2 Tablets Lanoril Salicylate  Anaprox Cuprimine Capsules Levenox Salocol  Anexsia-D Dalteparin Magan Salsalate  Anodynos Darvon compound Magnesium Salicylate Sine-off  Ansaid Dasin Capsules Magsal Sodium Salicylate  Anturane Depen Capsules Marnal Soma  APF Arthritis pain formula Dewitt's Pills Measurin Stanback  Argesic Dia-Gesic Meclofenamic Sulfinpyrazone  Arthritis Bayer Timed Release Aspirin Diclofenac Meclomen Sulindac  Arthritis pain formula Anacin Dicumarol Medipren Supac  Analgesic (Safety coated) Arthralgen Diffunasal Mefanamic Suprofen  Arthritis Strength Bufferin Dihydrocodeine Mepro Compound  Suprol  Arthropan liquid Dopirydamole Methcarbomol with Aspirin Synalgos  ASA tablets/Enseals Disalcid Micrainin Tagament  Ascriptin Doan's Midol Talwin  Ascriptin A/D Dolene Mobidin Tanderil  Ascriptin Extra Strength Dolobid Moblgesic Ticlid  Ascriptin with Codeine Doloprin or Doloprin with Codeine Momentum Tolectin  Asperbuf Duoprin Mono-gesic Trendar  Aspergum Duradyne Motrin or Motrin IB Triminicin  Aspirin plain, buffered or enteric coated Durasal Myochrisine Trigesic  Aspirin Suppositories Easprin Nalfon Trillsate  Aspirin with Codeine Ecotrin Regular or Extra Strength Naprosyn Uracel  Atromid-S Efficin Naproxen Ursinus  Auranofin Capsules Elmiron Neocylate Vanquish  Axotal Emagrin Norgesic Verin  Azathioprine Empirin or Empirin with Codeine Normiflo Vitamin E  Azolid Emprazil Nuprin Voltaren  Bayer Aspirin plain, buffered or children's or timed BC Tablets or powders Encaprin Orgaran Warfarin Sodium  Buff-a-Comp Enoxaparin Orudis Zorpin  Buff-a-Comp with Codeine Equegesic Os-Cal-Gesic   Buffaprin Excedrin plain, buffered or Extra Strength Oxalid   Bufferin Arthritis Strength Feldene Oxphenbutazone   Bufferin plain or Extra Strength Feldene Capsules Oxycodone with Aspirin   Bufferin with Codeine Fenoprofen Fenoprofen Pabalate or Pabalate-SF   Buffets II Flogesic Panagesic   Buffinol plain or Extra Strength Florinal or Florinal with Codeine Panwarfarin   Buf-Tabs Flurbiprofen Penicillamine   Butalbital Compound Four-way cold tablets Penicillin   Butazolidin Fragmin Pepto-Bismol   Carbenicillin Geminisyn Percodan   Carna Arthritis Reliever Geopen Persantine   Carprofen Gold's salt Persistin   Chloramphenicol Goody's Phenylbutazone   Chloromycetin Haltrain Piroxlcam   Clmetidine heparin Plaquenil   Cllnoril Hyco-pap Ponstel   Clofibrate Hydroxy chloroquine Propoxyphen         Before stopping any of these medications, be sure to consult the physician who ordered them.  Some,  such as Coumadin (Warfarin) are ordered to prevent or treat serious conditions such as "deep thrombosis", "pumonary embolisms", and other heart problems.  The amount of time that you may need off of the medication may also vary with the medication and the reason for which you were taking it.  If you are taking any of these medications, please make sure you notify your pain physician before you undergo any procedures.       Radiofrequency Lesioning Radiofrequency lesioning is  a procedure to relieve pain. The procedure is often used for back, neck, or arm pain. Radiofrequency lesioning uses a specialized machine that creates radio waves to make heat. The heat damages the nerve that carries the pain signal. Pain relief usually lasts 6 months to 1 year.  LET YOUR CAREGIVER KNOW ABOUT:  Allergies to food or medicine.  Medicines taken, including vitamins, herbs, eyedrops, over-the-counter medicines, and creams.  Use of steroids (by mouth or creams).  Previous problems with anesthetics or numbing medicines.  History of bleeding problems or blood clots.  Previous surgery.  Other health problems, including diabetes and kidney problems.  Possibility of pregnancy, if this applies.  Breathing problems and smoking history.  Any recent colds or infections. RISKS AND COMPLICATIONS This procedure is generally safe. The risks and complications depend on what treatment site is used. General complications may include:  Pain or soreness at the injection site.  Infection at the injection site.  Damage to nerves or blood vessels. BEFORE THE PROCEDURE  Ask your caregiver about changing or stopping any medicines you are on before the procedure.  If you take blood thinners, ask if you should stop taking them before the procedure.  You may be asked to wash with an antibiotic soap before the procedure.  Do not eat or drink for 8 hours before your procedure or as told by your caregiver.  Ask your  caregiver what time you need to arrive for your procedure.  This is an outpatient procedure. This means you will be able to go home the same day. Make plans for someone to drive you home. PROCEDURE  You will be awake during the procedure. You need to be able to talk to the surgeon during the procedure. However, you might be given medicine to help you relax (sedative).  Medicine to numb the area (local anesthetic) will be injected.  With the help of a type of X-ray (fluoroscopy), a radio frequency needle will be inserted into the area to be treated. Then, a wire that carries the radio waves (electrode) will be put through the radio frequency needle. An electrical pulse will be sent through the electrode to verify the correct nerve.  You will feel a tingling sensation similar to hitting your "funny bone." You may also have muscle twitching. The tissue around the needle tip is then heated when electric current is passed using the radio frequency machine. This numbs the nerves.  A bandage (dressing) will be put on the area after the procedure is done. AFTER THE PROCEDURE  You will stay in a recovery area until you are awake enough to eat and drink.  Once everything is back to normal, you will be able to go home.  You will need to arrange for someone to drive you home if you received a sedative or pain relieving medicine during the procedure. Document Released: 06/12/2011 Document Revised: 01/06/2012 Document Reviewed: 06/12/2011 [facility] Patient Information 2015 [place], [place]. This information is not intended to replace advice given to you by your health care provider. Make sure you discuss any questions you have with your health care provider.

## 2015-03-07 NOTE — Progress Notes (Signed)
   Subjective:    Patient ID: Karen Edwards, female    DOB: 03-12-1966, 49 y.o.   MRN: [id number]  HPI    Review of Systems     Objective:   Physical Exam        Assessment & Plan:

## 2015-03-09 LAB — HM MAMMOGRAPHY

## 2015-03-09 NOTE — Addendum Note (Signed)
Addended by: Dewayne Shorter on: 03/09/2015 11:22 AM   Modules accepted: Orders

## 2015-03-19 ENCOUNTER — Other Ambulatory Visit: Payer: Self-pay | Admitting: Pain Medicine

## 2015-03-19 DIAGNOSIS — M533 Sacrococcygeal disorders, not elsewhere classified: Secondary | ICD-10-CM | POA: Insufficient documentation

## 2015-03-19 DIAGNOSIS — M51379 Other intervertebral disc degeneration, lumbosacral region without mention of lumbar back pain or lower extremity pain: Secondary | ICD-10-CM | POA: Insufficient documentation

## 2015-03-19 DIAGNOSIS — M5416 Radiculopathy, lumbar region: Secondary | ICD-10-CM | POA: Insufficient documentation

## 2015-03-19 DIAGNOSIS — M5137 Other intervertebral disc degeneration, lumbosacral region: Secondary | ICD-10-CM

## 2015-03-19 DIAGNOSIS — G90521 Complex regional pain syndrome I of right lower limb: Secondary | ICD-10-CM

## 2015-03-20 ENCOUNTER — Ambulatory Visit: Payer: PPO | Attending: Pain Medicine | Admitting: Pain Medicine

## 2015-03-20 ENCOUNTER — Encounter: Payer: Self-pay | Admitting: Pain Medicine

## 2015-03-20 VITALS — BP 102/60 | HR 57 | Temp 98.3°F | Resp 16 | Ht 60.0 in | Wt 135.0 lb

## 2015-03-20 DIAGNOSIS — M533 Sacrococcygeal disorders, not elsewhere classified: Secondary | ICD-10-CM

## 2015-03-20 DIAGNOSIS — M47816 Spondylosis without myelopathy or radiculopathy, lumbar region: Secondary | ICD-10-CM | POA: Insufficient documentation

## 2015-03-20 DIAGNOSIS — G90521 Complex regional pain syndrome I of right lower limb: Secondary | ICD-10-CM

## 2015-03-20 DIAGNOSIS — M5030 Other cervical disc degeneration, unspecified cervical region: Secondary | ICD-10-CM

## 2015-03-20 DIAGNOSIS — M5416 Radiculopathy, lumbar region: Secondary | ICD-10-CM

## 2015-03-20 DIAGNOSIS — M5136 Other intervertebral disc degeneration, lumbar region: Secondary | ICD-10-CM

## 2015-03-20 DIAGNOSIS — M5126 Other intervertebral disc displacement, lumbar region: Secondary | ICD-10-CM | POA: Insufficient documentation

## 2015-03-20 DIAGNOSIS — M5481 Occipital neuralgia: Secondary | ICD-10-CM

## 2015-03-20 DIAGNOSIS — M5137 Other intervertebral disc degeneration, lumbosacral region: Secondary | ICD-10-CM

## 2015-03-20 DIAGNOSIS — M545 Low back pain: Secondary | ICD-10-CM | POA: Diagnosis present

## 2015-03-20 MED ORDER — ORPHENADRINE CITRATE 30 MG/ML IJ SOLN
INTRAMUSCULAR | Status: AC
  Administered 2015-03-20: 60 mg
  Filled 2015-03-20: qty 2

## 2015-03-20 MED ORDER — CEFUROXIME AXETIL 250 MG PO TABS: 250.0000 mg | ORAL_TABLET | Freq: Two times a day (BID) | ORAL | Status: DC

## 2015-03-20 MED ORDER — BUPIVACAINE HCL (PF) 0.25 % IJ SOLN
INTRAMUSCULAR | Status: AC
  Administered 2015-03-20: 30 mL
  Filled 2015-03-20: qty 30

## 2015-03-20 MED ORDER — FENTANYL CITRATE (PF) 100 MCG/2ML IJ SOLN
INTRAMUSCULAR | Status: AC
  Administered 2015-03-20: 100 ug via INTRAVENOUS
  Filled 2015-03-20: qty 2

## 2015-03-20 MED ORDER — MIDAZOLAM HCL 5 MG/5ML IJ SOLN
INTRAMUSCULAR | Status: AC
  Administered 2015-03-20: 5 mg via INTRAVENOUS
  Filled 2015-03-20: qty 5

## 2015-03-20 MED ORDER — TRIAMCINOLONE ACETONIDE 40 MG/ML IJ SUSP
INTRAMUSCULAR | Status: AC
  Administered 2015-03-20: 10 mg
  Filled 2015-03-20: qty 1

## 2015-03-20 NOTE — Progress Notes (Signed)
PROCEDURE PERFORMED: Lumbosacral selective nerve root block   NOTE: The patient is a 49 y.o. female who returns to [place] for further evaluation and treatment of pain involving the lumbar and lower extremity region. 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 Patient with severe pain radiating from the lumbar region to the lower extremities becoming more severe with standing and walking there is concern regarding radicular pain as well as discogenic pain with radicular referral pattern severe muscle spasms felt to be contributing to patient's symptoms. There is concern regarding intraspinal abnormalities contributing to the patient's symptomatology. The risks, benefits, and expectations of the procedure have been explained to the patient who was understanding and in agreement with suggested treatment plan. We will proceed with interventional treatment as discussed and as explained to the patient. The patient is understanding and in agreement with suggested treatment plan.   DESCRIPTION OF PROCEDURE: Lumbosacral selective nerve root block with IV Versed, IV fentanyl conscious sedation, EKG, blood pressure, pulse, and pulse oximetry monitoring. The procedure was performed with the patient in the prone position under fluoroscopic guidance. With the patient in the prone position, Betadine prep of proposed entry site was performed. Local anesthetic skin wheal of proposed needle entry site was prepared with 1.5% plain lidocaine with AP view of the lumbosacral spine.   PROCEDURE #1: Needle placement at the left L2 vertebral body: A 22 -gauge needle was inserted at the inferior border of the transverse process of the vertebral body with needle placed medial to the midline of the transverse process on AP view of the lumbosacral  spine.  Repeat this technique at L3, L4, L5, vertebral body levels .    Needle placement was then verified on lateral view at all levels with needle tip documented to be in the posterior superior quadrant of the intervertebral foramen of  L2, L3, L4, and L5 vertebral body levels. Following negative aspiration for heme and CSF at each level, each level was injected with 3 mL of 0.25% bupivacaine with Kenalog. The patient tolerated the procedure well.    Repeat this technique on the right at L2, L3, L4, and L5, exactly as was performed on the left side at L2, L3, L4, and L5,   A total of 10 mg of Kenalog was utilized for the procedure.   PLAN:  1. Medications: Will continue presently prescribed medications. 2. The patient is to undergo follow-up evaluation with primary care physician for evaluation of blood pressure and general medical condition status post procedure performed on today's visit. 3. Surgical follow-up evaluation. 4. Neurological evaluation. 5. May consider radiofrequency procedures, implantation type procedures and other treatment pending response to treatment and follow-up evaluation. 6. The patient has been advise do adhere to proper body mechanics and avoid activities which may aggravate condition. 7. The patient has been advised to call the Pain Management Center prior to scheduled return appointment should there be significant change in the patient's condition or should the patient have other concerns regarding condition prior to scheduled return appointment.

## 2015-03-20 NOTE — Progress Notes (Signed)
Discharged via w/c at 1:10 pm. Tolerating po fluids well. Instructions (verbal and written) given to patient. Teachback x3.

## 2015-03-20 NOTE — Patient Instructions (Addendum)
Continue present medications and antibioticcs  F/U PCP for evaliation of  BP and general medical  condition.  F/U surgical evaluation.  F/U nrurological evaluation.  May consider radiofrequency rhizolysis or intraspinal procedures pending response to present treatment and F/U evaluation.  Patient to call Pain Management Center should patient have concerns prior to scheduled return appointment.    Pain Management Discharge Instructions  General Discharge Instructions :  If you need to reach your doctor call: Monday-Friday 8:00 am - 4:00 pm at [phone] or toll free [phone].  After clinic hours [phone] to have operator reach doctor.  Bring all of your medication bottles to all your appointments in the pain clinic.  To cancel or reschedule your appointment with Pain Management please remember to call 24 hours in advance to avoid a fee.  Refer to the educational materials which you have been given on: General Risks, I had my Procedure. Discharge Instructions, Post Sedation.  Post Procedure Instructions:  The drugs you were given will stay in your system until tomorrow, so for the next 24 hours you should not drive, make any legal decisions or drink any alcoholic beverages.  You may eat anything you prefer, but it is better to start with liquids then soups and crackers, and gradually work up to solid foods.  Please notify your doctor immediately if you have any unusual bleeding, trouble breathing or pain that is not related to your normal pain.  Depending on the type of procedure that was done, some parts of your body may feel week and/or numb.  This usually clears up by tonight or the next day.  Walk with the use of an assistive device or accompanied by an adult for the 24 hours.  You may use ice on the affected area for the first 24 hours.  Put ice in a Ziploc bag and cover with a towel and place against area 15 minutes on 15 minutes off.  You may switch to heat after  24 hours.  A prescription for CEFTIN was sent to your pharmacy and should be available for pickup today.

## 2015-03-20 NOTE — Progress Notes (Signed)
   Subjective:    Patient ID: Karen Edwards, female    DOB: 09/26/1966, 49 y.o.   MRN: [id number]  HPI    Review of Systems     Objective:   Physical Exam        Assessment & Plan:

## 2015-03-21 ENCOUNTER — Telehealth: Payer: Self-pay | Admitting: *Deleted

## 2015-03-21 ENCOUNTER — Ambulatory Visit (INDEPENDENT_AMBULATORY_CARE_PROVIDER_SITE_OTHER): Payer: PPO | Admitting: Primary Care

## 2015-03-21 ENCOUNTER — Encounter: Payer: Self-pay | Admitting: Primary Care

## 2015-03-21 VITALS — BP 122/76 | HR 69 | Temp 98.6°F | Ht 62.0 in | Wt 137.8 lb

## 2015-03-21 DIAGNOSIS — R5383 Other fatigue: Secondary | ICD-10-CM | POA: Diagnosis not present

## 2015-03-21 DIAGNOSIS — K649 Unspecified hemorrhoids: Secondary | ICD-10-CM | POA: Insufficient documentation

## 2015-03-21 DIAGNOSIS — F329 Major depressive disorder, single episode, unspecified: Secondary | ICD-10-CM

## 2015-03-21 DIAGNOSIS — K625 Hemorrhage of anus and rectum: Secondary | ICD-10-CM

## 2015-03-21 DIAGNOSIS — F32A Depression, unspecified: Secondary | ICD-10-CM | POA: Insufficient documentation

## 2015-03-21 DIAGNOSIS — M5416 Radiculopathy, lumbar region: Secondary | ICD-10-CM | POA: Diagnosis not present

## 2015-03-21 DIAGNOSIS — M47816 Spondylosis without myelopathy or radiculopathy, lumbar region: Secondary | ICD-10-CM

## 2015-03-21 DIAGNOSIS — M545 Low back pain: Secondary | ICD-10-CM

## 2015-03-21 DIAGNOSIS — R0683 Snoring: Secondary | ICD-10-CM

## 2015-03-21 NOTE — Progress Notes (Signed)
Pre visit review using our clinic review tool, if applicable. No additional management support is needed unless otherwise documented below in the visit note. 

## 2015-03-21 NOTE — Patient Instructions (Signed)
You will be contacted regarding your referral for your sleep study.  Please let us know if you have not heard back within one week.  It was a pleasure to meet you today! Please don't hesitate to call me with any questions. Welcome to [facility]!  Follow up in 3 months for follow up.

## 2015-03-21 NOTE — Assessment & Plan Note (Signed)
Managed by Dr. Primus Bravo with pain management. Stable on Tizanidine and vicodin.

## 2015-03-21 NOTE — Assessment & Plan Note (Signed)
Present for years, worsening over 2 months. CBC drawn by GYN recently and unremarkable per patient. Colonoscopy scheduled for June 7th. Will continue to monitor.

## 2015-03-21 NOTE — Telephone Encounter (Signed)
Patient denies any problems/concerns post procedure. 

## 2015-03-21 NOTE — Assessment & Plan Note (Signed)
Diagnosed several years ago and is managed by [facility] in [place]. Stable on Fluoxitine 20 mg TID and Trazodone 200 mg at bedtime. Would like to have meds managed here due to drive to [place]. Denies SI/HI Will continue to monitor. Follow up in 3 months for re-evaluation and refills.

## 2015-03-21 NOTE — Assessment & Plan Note (Signed)
Present for several months. Rectal bleeding evaluated by GYN with unremarkable CBC per patient. This may be OSA as she snores, wakes at night and has daytime tiredness. Referral made for sleep study eval. Will continue to monitor.

## 2015-03-21 NOTE — Progress Notes (Signed)
Subjective:    Patient ID: Karen Edwards, female    DOB: December 29, 1965, 49 y.o.   MRN: [id number]  HPI  [name] is a 49 year old female who presents today to establish care and discuss the problems mentioned below. Will obtain old records.  1) Depression: Diagnosed several years ago and is managed fluoxetine 60 mg daily and trazodone 200 mg at night for sleep. She feels well managed on these medications and goes to [facility] in [place] once every 3 months. Denies SI/HI. She was once seeing psychotherapy, but does not anymore. She would like to start following here due to not wanting to drive to [place].  2) Chronic back pain: Present for years and is managed with Dr. Primus Bravo at [facility] with pain management. She's not had low back surgery. She's currently taking tizanidine and vicodin for chronic pain. She's tried physical therapy in the past which made pain worse. Xrays of neck show curvature, one hip is longer than the other, and also has facet syndrome. She had a nerve block yesterday with Dr. Primus Bravo.   3) Rectal Bleeding: Present for years, started worsening over past several months and evaluated by GYN. She has colonoscopy scheduled for June 7th. Denies weakness but reports fatigue. CBC drawn by GYN and was unremarkable per patient.  Review of Systems  Constitutional: Positive for fatigue. Negative for unexpected weight change.  HENT: Negative for rhinorrhea.   Respiratory: Negative for cough and shortness of breath.   Cardiovascular: Negative for chest pain.  Gastrointestinal: Positive for blood in stool. Negative for diarrhea.       Chronic constipation. Bowel movements every   Musculoskeletal: Positive for back pain.  Skin: Negative for rash.  Allergic/Immunologic: Positive for environmental allergies.  Neurological: Negative for headaches.       Feels off balance sometimes, has fallen 3 times in the past year.  Psychiatric/Behavioral:       See HPI       Past Medical  History  Diagnosis Date  . Hypotension   . MVP (mitral valve prolapse)   . Depression   . Chronic back pain     History   Social History  . Marital Status: Married    Spouse Name: N/A  . Number of Children: N/A  . Years of Education: N/A   Occupational History  . Not on file.   Social History Main Topics  . Smoking status: Current Every Day Smoker -- 0.50 packs/day    Types: Cigarettes  . Smokeless tobacco: Not on file  . Alcohol Use: No     Comment: drinks an occasional beer  . Drug Use: No  . Sexual Activity: Yes   Other Topics Concern  . Not on file   Social History Narrative   Married.   2 children. One boy and one girl.   On disability.   Playing with her dogs, gardening.       Past Surgical History  Procedure Laterality Date  . Hysteroscopy    . Dilation and curettage of uterus    . Cesarean section    . Other surgical history Right 2015    ankle  . Ankle surgery Right 2015    Family History  Problem Relation Age of Onset  . Arthritis Mother   . Depression Mother   . Hyperlipidemia Mother   . Hypertension Mother   . Kidney disease Mother   . Mental illness Mother   . Vision loss Mother   . Varicose Veins  Mother   . Hearing loss Father   . Hyperlipidemia Father   . Hypertension Father     Allergies  Allergen Reactions  . No Known Allergies     Current Outpatient Prescriptions on File Prior to Visit  Medication Sig Dispense Refill  . FLUoxetine (PROZAC) 20 MG capsule Take 20 mg by mouth daily. 3 caps daily    . HYDROcodone-acetaminophen (NORCO/VICODIN) 5-325 MG per tablet Limit 1/2 - 1 tab po / day or bid if tolerated 30 tablet 0  . medroxyPROGESTERone (PROVERA) 10 MG tablet Take 10 mg by mouth daily.    . tizanidine (ZANAFLEX) 2 MG capsule Limit 1 tablet at bedtime if tolerated 30 capsule 0  . traZODone (DESYREL) 100 MG tablet Take 100 mg by mouth at bedtime. 2 tabs at bedtime     No current facility-administered medications on file  prior to visit.    BP 122/76 mmHg  Pulse 69  Temp(Src) 98.6 F (37 C) (Oral)  Ht 5\' 2"  (1.575 m)  Wt 137 lb 12.8 oz (62.506 kg)  BMI 25.20 kg/m2  SpO2 98%     Objective:   Physical Exam  Constitutional: She is oriented to person, place, and time. She appears well-nourished.  Cardiovascular: Normal rate and regular rhythm.   Pulmonary/Chest: Effort normal and breath sounds normal.  Abdominal: Soft. Bowel sounds are normal.  Musculoskeletal:       Lumbar back: She exhibits decreased range of motion.  Chronic back pain.  Neurological: She is alert and oriented to person, place, and time. She has normal reflexes.  Skin: Skin is warm and dry.  Psychiatric: She has a normal mood and affect.          Assessment & Plan:

## 2015-03-22 ENCOUNTER — Other Ambulatory Visit: Payer: Self-pay | Admitting: Pain Medicine

## 2015-04-02 ENCOUNTER — Other Ambulatory Visit: Payer: Self-pay | Admitting: Pain Medicine

## 2015-04-06 ENCOUNTER — Encounter: Payer: Self-pay | Admitting: Pain Medicine

## 2015-04-06 ENCOUNTER — Ambulatory Visit: Payer: PPO | Attending: Pain Medicine | Admitting: Pain Medicine

## 2015-04-06 VITALS — BP 111/63 | HR 82 | Temp 98.8°F | Resp 16 | Ht 60.0 in | Wt 130.0 lb

## 2015-04-06 DIAGNOSIS — M533 Sacrococcygeal disorders, not elsewhere classified: Secondary | ICD-10-CM | POA: Diagnosis not present

## 2015-04-06 DIAGNOSIS — M545 Low back pain: Secondary | ICD-10-CM | POA: Diagnosis present

## 2015-04-06 DIAGNOSIS — M5136 Other intervertebral disc degeneration, lumbar region: Secondary | ICD-10-CM | POA: Diagnosis not present

## 2015-04-06 DIAGNOSIS — G90521 Complex regional pain syndrome I of right lower limb: Secondary | ICD-10-CM

## 2015-04-06 DIAGNOSIS — M79604 Pain in right leg: Secondary | ICD-10-CM | POA: Diagnosis present

## 2015-04-06 DIAGNOSIS — M5137 Other intervertebral disc degeneration, lumbosacral region: Secondary | ICD-10-CM

## 2015-04-06 DIAGNOSIS — M5030 Other cervical disc degeneration, unspecified cervical region: Secondary | ICD-10-CM

## 2015-04-06 DIAGNOSIS — M47816 Spondylosis without myelopathy or radiculopathy, lumbar region: Secondary | ICD-10-CM

## 2015-04-06 DIAGNOSIS — M5416 Radiculopathy, lumbar region: Secondary | ICD-10-CM

## 2015-04-06 DIAGNOSIS — M79605 Pain in left leg: Secondary | ICD-10-CM | POA: Diagnosis present

## 2015-04-06 DIAGNOSIS — G8929 Other chronic pain: Secondary | ICD-10-CM

## 2015-04-06 DIAGNOSIS — M5481 Occipital neuralgia: Secondary | ICD-10-CM

## 2015-04-06 MED ORDER — TIZANIDINE HCL 2 MG PO CAPS: ORAL_CAPSULE | ORAL | Status: DC

## 2015-04-06 MED ORDER — HYDROCODONE-ACETAMINOPHEN 5-325 MG PO TABS: ORAL_TABLET | ORAL | Status: DC

## 2015-04-06 NOTE — Patient Instructions (Addendum)
Continue present medications. Zanaflex ( tizanidine) and hydrocodone acetaminophen   Lumbar facet, medial branch nerve, blocks to be performed 04/17/2015  F/U PCP for evaliation of  BP and general medical  condition.  F/U surgical evaluation.  F/U neurological evaluation.  May consider radiofrequency rhizolysis or intraspinal procedures pending response to present treatment and F/U evaluation.  Patient to call [facility] should patient have concerns prior to scheduled return appointment. GENERAL RISKS AND COMPLICATIONS  What are the risk, side effects and possible complications? Generally speaking, most procedures are safe.  However, with any procedure there are risks, side effects, and the possibility of complications.  The risks and complications are dependent upon the sites that are lesioned, or the type of nerve block to be performed.  The closer the procedure is to the spine, the more serious the risks are.  Great care is taken when placing the radio frequency needles, block needles or lesioning probes, but sometimes complications can occur. 1. Infection: Any time there is an injection through the skin, there is a risk of infection.  This is why sterile conditions are used for these blocks.  There are four possible types of infection. 1. Localized skin infection. 2. Central Nervous System Infection-This can be in the form of Meningitis, which can be deadly. 3. Epidural Infections-This can be in the form of an epidural abscess, which can cause pressure inside of the spine, causing compression of the spinal cord with subsequent paralysis. This would require an emergency surgery to decompress, and there are no guarantees that the patient would recover from the paralysis. 4. Discitis-This is an infection of the intervertebral discs.  It occurs in about 1% of discography procedures.  It is difficult to treat and it may lead to surgery.        2. Pain: the needles have to go through  skin and soft tissues, will cause soreness.       3. Damage to internal structures:  The nerves to be lesioned may be near blood vessels or    other nerves which can be potentially damaged.       4. Bleeding: Bleeding is more common if the patient is taking blood thinners such as  aspirin, Coumadin, Ticiid, Plavix, etc., or if he/she have some genetic predisposition  such as hemophilia. Bleeding into the spinal canal can cause compression of the spinal  cord with subsequent paralysis.  This would require an emergency surgery to  decompress and there are no guarantees that the patient would recover from the  paralysis.       5. Pneumothorax:  Puncturing of a lung is a possibility, every time a needle is introduced in  the area of the chest or upper back.  Pneumothorax refers to free air around the  collapsed lung(s), inside of the thoracic cavity (chest cavity).  Another two possible  complications related to a similar event would include: Hemothorax and Chylothorax.   These are variations of the Pneumothorax, where instead of air around the collapsed  lung(s), you may have blood or chyle, respectively.       6. Spinal headaches: They may occur with any procedures in the area of the spine.       7. Persistent CSF (Cerebro-Spinal Fluid) leakage: This is a rare problem, but may occur  with prolonged intrathecal or epidural catheters either due to the formation of a fistulous  track or a dural tear.       8. Nerve damage: By working  so close to the spinal cord, there is always a possibility of  nerve damage, which could be as serious as a permanent spinal cord injury with  paralysis.       9. Death:  Although rare, severe deadly allergic reactions known as "Anaphylactic  reaction" can occur to any of the medications used.      10. Worsening of the symptoms:  We can always make thing worse.  What are the chances of something like this happening? Chances of any of this occuring are extremely low.  By  statistics, you have more of a chance of getting killed in a motor vehicle accident: while driving to the hospital than any of the above occurring .  Nevertheless, you should be aware that they are possibilities.  In general, it is similar to taking a shower.  Everybody knows that you can slip, hit your head and get killed.  Does that mean that you should not shower again?  Nevertheless always keep in mind that statistics do not mean anything if you happen to be on the wrong side of them.  Even if a procedure has a 1 (one) in a 1,000,000 (million) chance of going wrong, it you happen to be that one..Also, keep in mind that by statistics, you have more of a chance of having something go wrong when taking medications.  Who should not have this procedure? If you are on a blood thinning medication (e.g. Coumadin, Plavix, see list of "Blood Thinners"), or if you have an active infection going on, you should not have the procedure.  If you are taking any blood thinners, please inform your physician.  How should I prepare for this procedure?  Do not eat or drink anything at least six hours prior to the procedure.  Bring a driver with you .  It cannot be a taxi.  Come accompanied by an adult that can drive you back, and that is strong enough to help you if your legs get weak or numb from the local anesthetic.  Take all of your medicines the morning of the procedure with just enough water to swallow them.  If you have diabetes, make sure that you are scheduled to have your procedure done first thing in the morning, whenever possible.  If you have diabetes, take only half of your insulin dose and notify our nurse that you have done so as soon as you arrive at the clinic.  If you are diabetic, but only take blood sugar pills (oral hypoglycemic), then do not take them on the morning of your procedure.  You may take them after you have had the procedure.  Do not take aspirin or any aspirin-containing  medications, at least eleven (11) days prior to the procedure.  They may prolong bleeding.  Wear loose fitting clothing that may be easy to take off and that you would not mind if it got stained with Betadine or blood.  Do not wear any jewelry or perfume  Remove any nail coloring.  It will interfere with some of our monitoring equipment.  NOTE: Remember that this is not meant to be interpreted as a complete list of all possible complications.  Unforeseen problems may occur.  BLOOD THINNERS The following drugs contain aspirin or other products, which can cause increased bleeding during surgery and should not be taken for 2 weeks prior to and 1 week after surgery.  If you should need take something for relief of minor pain, you may take acetaminophen  which is found in Tylenol,m Datril, Anacin-3 and Panadol. It is not blood thinner. The products listed below are.  Do not take any of the products listed below in addition to any listed on your instruction sheet.  A.P.C or A.P.C with Codeine Codeine Phosphate Capsules #3 Ibuprofen Ridaura  ABC compound Congesprin Imuran rimadil  Advil Cope Indocin Robaxisal  Alka-Seltzer Effervescent Pain Reliever and Antacid Coricidin or Coricidin-D  Indomethacin Rufen  Alka-Seltzer plus Cold Medicine Cosprin Ketoprofen S-A-C Tablets  Anacin Analgesic Tablets or Capsules Coumadin Korlgesic Salflex  Anacin Extra Strength Analgesic tablets or capsules CP-2 Tablets Lanoril Salicylate  Anaprox Cuprimine Capsules Levenox Salocol  Anexsia-D Dalteparin Magan Salsalate  Anodynos Darvon compound Magnesium Salicylate Sine-off  Ansaid Dasin Capsules Magsal Sodium Salicylate  Anturane Depen Capsules Marnal Soma  APF Arthritis pain formula Dewitt's Pills Measurin Stanback  Argesic Dia-Gesic Meclofenamic Sulfinpyrazone  Arthritis Bayer Timed Release Aspirin Diclofenac Meclomen Sulindac  Arthritis pain formula Anacin Dicumarol Medipren Supac  Analgesic (Safety coated)  Arthralgen Diffunasal Mefanamic Suprofen  Arthritis Strength Bufferin Dihydrocodeine Mepro Compound Suprol  Arthropan liquid Dopirydamole Methcarbomol with Aspirin Synalgos  ASA tablets/Enseals Disalcid Micrainin Tagament  Ascriptin Doan's Midol Talwin  Ascriptin A/D Dolene Mobidin Tanderil  Ascriptin Extra Strength Dolobid Moblgesic Ticlid  Ascriptin with Codeine Doloprin or Doloprin with Codeine Momentum Tolectin  Asperbuf Duoprin Mono-gesic Trendar  Aspergum Duradyne Motrin or Motrin IB Triminicin  Aspirin plain, buffered or enteric coated Durasal Myochrisine Trigesic  Aspirin Suppositories Easprin Nalfon Trillsate  Aspirin with Codeine Ecotrin Regular or Extra Strength Naprosyn Uracel  Atromid-S Efficin Naproxen Ursinus  Auranofin Capsules Elmiron Neocylate Vanquish  Axotal Emagrin Norgesic Verin  Azathioprine Empirin or Empirin with Codeine Normiflo Vitamin E  Azolid Emprazil Nuprin Voltaren  Bayer Aspirin plain, buffered or children's or timed BC Tablets or powders Encaprin Orgaran Warfarin Sodium  Buff-a-Comp Enoxaparin Orudis Zorpin  Buff-a-Comp with Codeine Equegesic Os-Cal-Gesic   Buffaprin Excedrin plain, buffered or Extra Strength Oxalid   Bufferin Arthritis Strength Feldene Oxphenbutazone   Bufferin plain or Extra Strength Feldene Capsules Oxycodone with Aspirin   Bufferin with Codeine Fenoprofen Fenoprofen Pabalate or Pabalate-SF   Buffets II Flogesic Panagesic   Buffinol plain or Extra Strength Florinal or Florinal with Codeine Panwarfarin   Buf-Tabs Flurbiprofen Penicillamine   Butalbital Compound Four-way cold tablets Penicillin   Butazolidin Fragmin Pepto-Bismol   Carbenicillin Geminisyn Percodan   Carna Arthritis Reliever Geopen Persantine   Carprofen Gold's salt Persistin   Chloramphenicol Goody's Phenylbutazone   Chloromycetin Haltrain Piroxlcam   Clmetidine heparin Plaquenil   Cllnoril Hyco-pap Ponstel   Clofibrate Hydroxy chloroquine Propoxyphen          Before stopping any of these medications, be sure to consult the physician who ordered them.  Some, such as Coumadin (Warfarin) are ordered to prevent or treat serious conditions such as "deep thrombosis", "pumonary embolisms", and other heart problems.  The amount of time that you may need off of the medication may also vary with the medication and the reason for which you were taking it.  If you are taking any of these medications, please make sure you notify your pain physician before you undergo any procedures.         Facet Joint Block The facet joints connect the bones of the spine (vertebrae). They make it possible for you to bend, twist, and make other movements with your spine. They also prevent you from overbending, overtwisting, and making other excessive movements.  A facet joint block is a procedure  where a numbing medicine (anesthetic) is injected into a facet joint. Often, a type of anti-inflammatory medicine called a steroid is also injected. A facet joint block may be done for two reasons:  2. Diagnosis. A facet joint block may be done as a test to see whether neck or back pain is caused by a worn-down or infected facet joint. If the pain gets better after a facet joint block, it means the pain is probably coming from the facet joint. If the pain does not get better, it means the pain is probably not coming from the facet joint.  3. Therapy. A facet joint block may be done to relieve neck or back pain caused by a facet joint. A facet joint block is only done as a therapy if the pain does not improve with medicine, exercise programs, physical therapy, and other forms of pain management. LET [facility] CARE PROVIDER KNOW ABOUT:   Any allergies you have.   All medicines you are taking, including vitamins, herbs, eyedrops, and over-the-counter medicines and creams.   Previous problems you or members of your family have had with the use of anesthetics.   Any blood disorders  you have had.   Other health problems you have. RISKS AND COMPLICATIONS Generally, having a facet joint block is safe. However, as with any procedure, complications can occur. Possible complications associated with having a facet joint block include:   Bleeding.   Injury to a nerve near the injection site.   Pain at the injection site.   Weakness or numbness in areas controlled by nerves near the injection site.   Infection.   Temporary fluid retention.   Allergic reaction to anesthetics or medicines used during the procedure. BEFORE THE PROCEDURE   Follow your health care provider's instructions if you are taking dietary supplements or medicines. You may need to stop taking them or reduce your dosage.   Do not take any new dietary supplements or medicines without asking your health care provider first.   Follow your health care provider's instructions about eating and drinking before the procedure. You may need to stop eating and drinking several hours before the procedure.   Arrange to have an adult drive you home after the procedure. PROCEDURE 12. You may need to remove your clothing and dress in an open-back gown so that your health care provider can access your spine.  13. The procedure will be done while you are lying on an X-ray table. Most of the time you will be asked to lie on your stomach, but you may be asked to lie in a different position if an injection will be made in your neck.  14. Special machines will be used to monitor your oxygen levels, heart rate, and blood pressure.  15. If an injection will be made in your neck, an intravenous (IV) tube will be inserted into one of your veins. Fluids and medicine will flow directly into your body through the IV tube.  16. The area over the facet joint where the injection will be made will be cleaned with an antiseptic soap. The surrounding skin will be covered with sterile drapes.  17. An anesthetic will be  applied to your skin to make the injection area numb. You may feel a temporary stinging or burning sensation.  18. A video X-ray machine will be used to locate the joint. A contrast dye may be injected into the facet joint area to help with locating the joint.  19. When  the joint is located, an anesthetic medicine will be injected into the joint through the needle.  109. Your health care provider will ask you whether you feel pain relief. If you do feel relief, a steroid may be injected to provide pain relief for a longer period of time. If you do not feel relief or feel only partial relief, additional injections of an anesthetic may be made in other facet joints.  21. The needle will be removed, the skin will be cleansed, and bandages will be applied.  AFTER THE PROCEDURE   You will be observed for 15-30 minutes before being allowed to go home. Do not drive. Have an adult drive you or take a taxi or public transportation instead.   If you feel pain relief, the pain will return in several hours or days when the anesthetic wears off.   You may feel pain relief 2-14 days after the procedure. The amount of time this relief lasts varies from person to person.   It is normal to feel some tenderness over the injected area(s) for 2 days following the procedure.   If you have diabetes, you may have a temporary increase in blood sugar. Document Released: 03/05/2007 Document Revised: 02/28/2014 Document Reviewed: 08/03/2012 [facility] Patient Information 2015 [place], [place]. This information is not intended to replace advice given to you by your health care provider. Make sure you discuss any questions you have with your health care provider.

## 2015-04-06 NOTE — Progress Notes (Signed)
Safety precautions to be maintained throughout the outpatient stay will include: orient to surroundings, keep bed in low position, maintain call bell within reach at all times, provide assistance with transfer out of bed and ambulation.  

## 2015-04-06 NOTE — Progress Notes (Signed)
   Subjective:    Patient ID: Karen Edwards, female    DOB: 1966/03/20, 49 y.o.   MRN: [id number]  HPI  Patient is 49 year old female returns to [facility] for further evaluation and treatment of pain involving the lumbar lower extremity region. Patient also with some pain occurring in the region of the upper back region and base of the neck. Patient denies trauma change in events of daily living the call significant changes of the pathology. Patient states that the pain is aggravated by standing walking twisting turning maneuvers and becomes more intense as the day progresses. We discussed patient's condition and will consider patient interventional treatment at time return appointment as discussed. Patient tolerating medications well without undesirable side effects. The patient was understanding and agree with suggested treatment plan.  Review of Systems     Objective:   Physical Exam  Was tenderness of the splenius capitis and occipitalis musculature region palpation of which reproduced mild discomfort there was mild tends to palpation over the region of the left and right splenius capitis muscles. Palpation over the cervical facet cervical paraspinal musculature region was a tends to palpation of mild degree. There was moderate tends to palpation of the trapezius musculature region. There was unremarkable Spurling's maneuver. Patient appeared to be with bilaterally equal grip strength and Tinel and Phalen's maneuver without increased pain of any significant degree. Palpation over the thoracic facet thoracic paraspinal musculature region was a tends to palpation. No crepitus of the thoracic region was noted. Palpation over the lumbar paraspinal muscles lumbar facet region associated tends to palpation of moderate moderately severe discomfort. Lateral bending and rotation and extension to palpation of the lumbar facets reproduce moderate to moderately severe discomfort.  Figure-of-eight tolerated to 30 without increased pain with dorsiflexion noted. Negative clonus negative Homans. DTRs trace at the knees. No definite sensory deficit of dermatomal distribution detected. Abdomen nontender and no costovertebral maintenance noted.     Assessment & Plan:    Degenerative disc disease lumbar spine Degenerative changes lumbar spine L1-2 level degenerative disc disease with right central disc extrusion cranial migration of disc material  Lumbar facet syndrome  Sacroiliac joint dysfunction    Plan   Continue present medications.hydrocodone acetaminophen. We'll prescribe Zanaflex on today's visit as well. Patient was cautioned regarding respiratory depression confusion and other undesirable side effects which can occur with Zanaflex and the hydrocodone acetaminophen.  Lumbar facet, medial branch nerve, blocks to be performed at time return appointment  F/U PCP for evaliation of  BP and general medical  condition.  F/U surgical evaluation.neurosurgical reevaluation as discussed  F/U neurological evaluation.  May consider radiofrequency rhizolysis or intraspinal procedures pending response to present treatment and F/U evaluation.  [facility] to [facility] should patient have concerns prior to scheduled return appointment.

## 2015-04-06 NOTE — Progress Notes (Signed)
Discharged to home ambulatory with script for hydrocodone in hand. Pre procedure instructions given with teach back 3 done.

## 2015-04-10 ENCOUNTER — Other Ambulatory Visit: Payer: Self-pay

## 2015-04-10 MED ORDER — TRAZODONE HCL 100 MG PO TABS: 200.0000 mg | ORAL_TABLET | Freq: Every day | ORAL | Status: DC

## 2015-04-10 MED ORDER — FLUOXETINE HCL 20 MG PO CAPS: 60.0000 mg | ORAL_CAPSULE | Freq: Every day | ORAL | Status: DC

## 2015-04-10 NOTE — Telephone Encounter (Signed)
Pt request refill of prozac and trazodone to [facility] [address]. Pt seen to establish on 03/21/15.Please advise.

## 2015-04-17 ENCOUNTER — Ambulatory Visit: Payer: PPO | Attending: Pain Medicine | Admitting: Pain Medicine

## 2015-04-17 ENCOUNTER — Encounter: Payer: Self-pay | Admitting: Pain Medicine

## 2015-04-17 VITALS — BP 122/53 | HR 58 | Temp 98.5°F | Resp 16 | Ht 61.0 in | Wt 135.0 lb

## 2015-04-17 DIAGNOSIS — M47816 Spondylosis without myelopathy or radiculopathy, lumbar region: Secondary | ICD-10-CM | POA: Diagnosis not present

## 2015-04-17 DIAGNOSIS — G8929 Other chronic pain: Secondary | ICD-10-CM

## 2015-04-17 DIAGNOSIS — M79605 Pain in left leg: Secondary | ICD-10-CM | POA: Diagnosis present

## 2015-04-17 DIAGNOSIS — M5136 Other intervertebral disc degeneration, lumbar region: Secondary | ICD-10-CM

## 2015-04-17 DIAGNOSIS — M545 Low back pain: Secondary | ICD-10-CM | POA: Diagnosis present

## 2015-04-17 DIAGNOSIS — M533 Sacrococcygeal disorders, not elsewhere classified: Secondary | ICD-10-CM

## 2015-04-17 DIAGNOSIS — M79604 Pain in right leg: Secondary | ICD-10-CM | POA: Diagnosis present

## 2015-04-17 DIAGNOSIS — G90523 Complex regional pain syndrome I of lower limb, bilateral: Secondary | ICD-10-CM

## 2015-04-17 DIAGNOSIS — M5030 Other cervical disc degeneration, unspecified cervical region: Secondary | ICD-10-CM

## 2015-04-17 DIAGNOSIS — M5137 Other intervertebral disc degeneration, lumbosacral region: Secondary | ICD-10-CM

## 2015-04-17 DIAGNOSIS — M5416 Radiculopathy, lumbar region: Secondary | ICD-10-CM

## 2015-04-17 MED ORDER — TRIAMCINOLONE ACETONIDE 40 MG/ML IJ SUSP
INTRAMUSCULAR | Status: AC
  Administered 2015-04-17: 40 mg
  Filled 2015-04-17: qty 1

## 2015-04-17 MED ORDER — MIDAZOLAM HCL 5 MG/5ML IJ SOLN
INTRAMUSCULAR | Status: AC
  Administered 2015-04-17: 5 mg via INTRAVENOUS
  Filled 2015-04-17: qty 5

## 2015-04-17 MED ORDER — FENTANYL CITRATE (PF) 100 MCG/2ML IJ SOLN
INTRAMUSCULAR | Status: AC
  Administered 2015-04-17: 100 ug via INTRAVENOUS
  Filled 2015-04-17: qty 2

## 2015-04-17 MED ORDER — BUPIVACAINE HCL (PF) 0.25 % IJ SOLN
INTRAMUSCULAR | Status: AC
  Administered 2015-04-17: 30 mL
  Filled 2015-04-17: qty 30

## 2015-04-17 MED ORDER — ORPHENADRINE CITRATE 30 MG/ML IJ SOLN
INTRAMUSCULAR | Status: AC
  Administered 2015-04-17: 60 mg
  Filled 2015-04-17: qty 2

## 2015-04-17 NOTE — Progress Notes (Signed)
   Subjective:    Patient ID: Karen Edwards, female    DOB: 06-02-1966, 49 y.o.   MRN: [id number]  HPI    Review of Systems     Objective:   Physical Exam        Assessment & Plan:

## 2015-04-17 NOTE — Patient Instructions (Addendum)
Continue present medications.  F/U PCP for evaliation of  BP and general medical  condition.  F/U surgical evaluation.  F/U neurological evaluation.  May consider radiofrequency rhizolysis or intraspinal procedures pending response to present treatment and F/U evaluation.  Patient to call [facility] should patient have concerns prior to scheduled return appointment. GENERAL RISKS AND COMPLICATIONS  What are the risk, side effects and possible complications? Generally speaking, most procedures are safe.  However, with any procedure there are risks, side effects, and the possibility of complications.  The risks and complications are dependent upon the sites that are lesioned, or the type of nerve block to be performed.  The closer the procedure is to the spine, the more serious the risks are.  Great care is taken when placing the radio frequency needles, block needles or lesioning probes, but sometimes complications can occur. 1. Infection: Any time there is an injection through the skin, there is a risk of infection.  This is why sterile conditions are used for these blocks.  There are four possible types of infection. 1. Localized skin infection. 2. Central Nervous System Infection-This can be in the form of Meningitis, which can be deadly. 3. Epidural Infections-This can be in the form of an epidural abscess, which can cause pressure inside of the spine, causing compression of the spinal cord with subsequent paralysis. This would require an emergency surgery to decompress, and there are no guarantees that the patient would recover from the paralysis. 4. Discitis-This is an infection of the intervertebral discs.  It occurs in about 1% of discography procedures.  It is difficult to treat and it may lead to surgery.        2. Pain: the needles have to go through skin and soft tissues, will cause soreness.       3. Damage to internal structures:  The nerves to be lesioned may be near  blood vessels or    other nerves which can be potentially damaged.       4. Bleeding: Bleeding is more common if the patient is taking blood thinners such as  aspirin, Coumadin, Ticiid, Plavix, etc., or if he/she have some genetic predisposition  such as hemophilia. Bleeding into the spinal canal can cause compression of the spinal  cord with subsequent paralysis.  This would require an emergency surgery to  decompress and there are no guarantees that the patient would recover from the  paralysis.       5. Pneumothorax:  Puncturing of a lung is a possibility, every time a needle is introduced in  the area of the chest or upper back.  Pneumothorax refers to free air around the  collapsed lung(s), inside of the thoracic cavity (chest cavity).  Another two possible  complications related to a similar event would include: Hemothorax and Chylothorax.   These are variations of the Pneumothorax, where instead of air around the collapsed  lung(s), you may have blood or chyle, respectively.       6. Spinal headaches: They may occur with any procedures in the area of the spine.       7. Persistent CSF (Cerebro-Spinal Fluid) leakage: This is a rare problem, but may occur  with prolonged intrathecal or epidural catheters either due to the formation of a fistulous  track or a dural tear.       8. Nerve damage: By working so close to the spinal cord, there is always a possibility of  nerve damage, which could be  as serious as a permanent spinal cord injury with  paralysis.       9. Death:  Although rare, severe deadly allergic reactions known as "Anaphylactic  reaction" can occur to any of the medications used.      10. Worsening of the symptoms:  We can always make thing worse.  What are the chances of something like this happening? Chances of any of this occuring are extremely low.  By statistics, you have more of a chance of getting killed in a motor vehicle accident: while driving to the hospital than any of the  above occurring .  Nevertheless, you should be aware that they are possibilities.  In general, it is similar to taking a shower.  Everybody knows that you can slip, hit your head and get killed.  Does that mean that you should not shower again?  Nevertheless always keep in mind that statistics do not mean anything if you happen to be on the wrong side of them.  Even if a procedure has a 1 (one) in a 1,000,000 (million) chance of going wrong, it you happen to be that one..Also, keep in mind that by statistics, you have more of a chance of having something go wrong when taking medications.  Who should not have this procedure? If you are on a blood thinning medication (e.g. Coumadin, Plavix, see list of "Blood Thinners"), or if you have an active infection going on, you should not have the procedure.  If you are taking any blood thinners, please inform your physician.  How should I prepare for this procedure?  Do not eat or drink anything at least six hours prior to the procedure.  Bring a driver with you .  It cannot be a taxi.  Come accompanied by an adult that can drive you back, and that is strong enough to help you if your legs get weak or numb from the local anesthetic.  Take all of your medicines the morning of the procedure with just enough water to swallow them.  If you have diabetes, make sure that you are scheduled to have your procedure done first thing in the morning, whenever possible.  If you have diabetes, take only half of your insulin dose and notify our nurse that you have done so as soon as you arrive at the clinic.  If you are diabetic, but only take blood sugar pills (oral hypoglycemic), then do not take them on the morning of your procedure.  You may take them after you have had the procedure.  Do not take aspirin or any aspirin-containing medications, at least eleven (11) days prior to the procedure.  They may prolong bleeding.  Wear loose fitting clothing that may be easy  to take off and that you would not mind if it got stained with Betadine or blood.  Do not wear any jewelry or perfume  Remove any nail coloring.  It will interfere with some of our monitoring equipment.  NOTE: Remember that this is not meant to be interpreted as a complete list of all possible complications.  Unforeseen problems may occur.  BLOOD THINNERS The following drugs contain aspirin or other products, which can cause increased bleeding during surgery and should not be taken for 2 weeks prior to and 1 week after surgery.  If you should need take something for relief of minor pain, you may take acetaminophen which is found in Tylenol,m Datril, Anacin-3 and Panadol. It is not blood thinner. The products listed below  are.  Do not take any of the products listed below in addition to any listed on your instruction sheet.  A.P.C or A.P.C with Codeine Codeine Phosphate Capsules #3 Ibuprofen Ridaura  ABC compound Congesprin Imuran rimadil  Advil Cope Indocin Robaxisal  Alka-Seltzer Effervescent Pain Reliever and Antacid Coricidin or Coricidin-D  Indomethacin Rufen  Alka-Seltzer plus Cold Medicine Cosprin Ketoprofen S-A-C Tablets  Anacin Analgesic Tablets or Capsules Coumadin Korlgesic Salflex  Anacin Extra Strength Analgesic tablets or capsules CP-2 Tablets Lanoril Salicylate  Anaprox Cuprimine Capsules Levenox Salocol  Anexsia-D Dalteparin Magan Salsalate  Anodynos Darvon compound Magnesium Salicylate Sine-off  Ansaid Dasin Capsules Magsal Sodium Salicylate  Anturane Depen Capsules Marnal Soma  APF Arthritis pain formula Dewitt's Pills Measurin Stanback  Argesic Dia-Gesic Meclofenamic Sulfinpyrazone  Arthritis Bayer Timed Release Aspirin Diclofenac Meclomen Sulindac  Arthritis pain formula Anacin Dicumarol Medipren Supac  Analgesic (Safety coated) Arthralgen Diffunasal Mefanamic Suprofen  Arthritis Strength Bufferin Dihydrocodeine Mepro Compound Suprol  Arthropan liquid Dopirydamole  Methcarbomol with Aspirin Synalgos  ASA tablets/Enseals Disalcid Micrainin Tagament  Ascriptin Doan's Midol Talwin  Ascriptin A/D Dolene Mobidin Tanderil  Ascriptin Extra Strength Dolobid Moblgesic Ticlid  Ascriptin with Codeine Doloprin or Doloprin with Codeine Momentum Tolectin  Asperbuf Duoprin Mono-gesic Trendar  Aspergum Duradyne Motrin or Motrin IB Triminicin  Aspirin plain, buffered or enteric coated Durasal Myochrisine Trigesic  Aspirin Suppositories Easprin Nalfon Trillsate  Aspirin with Codeine Ecotrin Regular or Extra Strength Naprosyn Uracel  Atromid-S Efficin Naproxen Ursinus  Auranofin Capsules Elmiron Neocylate Vanquish  Axotal Emagrin Norgesic Verin  Azathioprine Empirin or Empirin with Codeine Normiflo Vitamin E  Azolid Emprazil Nuprin Voltaren  Bayer Aspirin plain, buffered or children's or timed BC Tablets or powders Encaprin Orgaran Warfarin Sodium  Buff-a-Comp Enoxaparin Orudis Zorpin  Buff-a-Comp with Codeine Equegesic Os-Cal-Gesic   Buffaprin Excedrin plain, buffered or Extra Strength Oxalid   Bufferin Arthritis Strength Feldene Oxphenbutazone   Bufferin plain or Extra Strength Feldene Capsules Oxycodone with Aspirin   Bufferin with Codeine Fenoprofen Fenoprofen Pabalate or Pabalate-SF   Buffets II Flogesic Panagesic   Buffinol plain or Extra Strength Florinal or Florinal with Codeine Panwarfarin   Buf-Tabs Flurbiprofen Penicillamine   Butalbital Compound Four-way cold tablets Penicillin   Butazolidin Fragmin Pepto-Bismol   Carbenicillin Geminisyn Percodan   Carna Arthritis Reliever Geopen Persantine   Carprofen Gold's salt Persistin   Chloramphenicol Goody's Phenylbutazone   Chloromycetin Haltrain Piroxlcam   Clmetidine heparin Plaquenil   Cllnoril Hyco-pap Ponstel   Clofibrate Hydroxy chloroquine Propoxyphen         Before stopping any of these medications, be sure to consult the physician who ordered them.  Some, such as Coumadin (Warfarin) are ordered  to prevent or treat serious conditions such as "deep thrombosis", "pumonary embolisms", and other heart problems.  The amount of time that you may need off of the medication may also vary with the medication and the reason for which you were taking it.  If you are taking any of these medications, please make sure you notify your pain physician before you undergo any procedures.         Facet Joint Block The facet joints connect the bones of the spine (vertebrae). They make it possible for you to bend, twist, and make other movements with your spine. They also prevent you from overbending, overtwisting, and making other excessive movements.  A facet joint block is a procedure where a numbing medicine (anesthetic) is injected into a facet joint. Often, a type of anti-inflammatory medicine called  a steroid is also injected. A facet joint block may be done for two reasons:  2. Diagnosis. A facet joint block may be done as a test to see whether neck or back pain is caused by a worn-down or infected facet joint. If the pain gets better after a facet joint block, it means the pain is probably coming from the facet joint. If the pain does not get better, it means the pain is probably not coming from the facet joint.  3. Therapy. A facet joint block may be done to relieve neck or back pain caused by a facet joint. A facet joint block is only done as a therapy if the pain does not improve with medicine, exercise programs, physical therapy, and other forms of pain management. LET [facility] CARE PROVIDER KNOW ABOUT:  1. Any allergies you have.  2. All medicines you are taking, including vitamins, herbs, eyedrops, and over-the-counter medicines and creams.  3. Previous problems you or members of your family have had with the use of anesthetics.  4. Any blood disorders you have had.  5. Other health problems you have. RISKS AND COMPLICATIONS Generally, having a facet joint block is safe. However, as  with any procedure, complications can occur. Possible complications associated with having a facet joint block include:  1. Bleeding.  2. Injury to a nerve near the injection site.  3. Pain at the injection site.  4. Weakness or numbness in areas controlled by nerves near the injection site.  5. Infection.  6. Temporary fluid retention.  7. Allergic reaction to anesthetics or medicines used during the procedure. BEFORE THE PROCEDURE   Follow your health care provider's instructions if you are taking dietary supplements or medicines. You may need to stop taking them or reduce your dosage.   Do not take any new dietary supplements or medicines without asking your health care provider first.   Follow your health care provider's instructions about eating and drinking before the procedure. You may need to stop eating and drinking several hours before the procedure.   Arrange to have an adult drive you home after the procedure. PROCEDURE 12. You may need to remove your clothing and dress in an open-back gown so that your health care provider can access your spine.  13. The procedure will be done while you are lying on an X-ray table. Most of the time you will be asked to lie on your stomach, but you may be asked to lie in a different position if an injection will be made in your neck.  14. Special machines will be used to monitor your oxygen levels, heart rate, and blood pressure.  15. If an injection will be made in your neck, an intravenous (IV) tube will be inserted into one of your veins. Fluids and medicine will flow directly into your body through the IV tube.  16. The area over the facet joint where the injection will be made will be cleaned with an antiseptic soap. The surrounding skin will be covered with sterile drapes.  17. An anesthetic will be applied to your skin to make the injection area numb. You may feel a temporary stinging or burning sensation.  18. A video X-ray  machine will be used to locate the joint. A contrast dye may be injected into the facet joint area to help with locating the joint.  19. When the joint is located, an anesthetic medicine will be injected into the joint through the needle.  20.  Your health care provider will ask you whether you feel pain relief. If you do feel relief, a steroid may be injected to provide pain relief for a longer period of time. If you do not feel relief or feel only partial relief, additional injections of an anesthetic may be made in other facet joints.  21. The needle will be removed, the skin will be cleansed, and bandages will be applied.  AFTER THE PROCEDURE  1. You will be observed for 15-30 minutes before being allowed to go home. Do not drive. Have an adult drive you or take a taxi or public transportation instead.  2. If you feel pain relief, the pain will return in several hours or days when the anesthetic wears off.  3. You may feel pain relief 2-14 days after the procedure. The amount of time this relief lasts varies from person to person.  4. It is normal to feel some tenderness over the injected area(s) for 2 days following the procedure.  5. If you have diabetes, you may have a temporary increase in blood sugar. Document Released: 03/05/2007 Document Revised: 02/28/2014 Document Reviewed: 08/03/2012 [facility] Patient Information 2015 [place], [place]. This information is not intended to replace advice given to you by your health care provider. Make sure you discuss any questions you have with your health care provider. Pain Management Discharge Instructions  General Discharge Instructions :  If you need to reach your doctor call: Monday-Friday 8:00 am - 4:00 pm at [phone] or toll free [phone].  After clinic hours [phone] to have operator reach doctor.  Bring all of your medication bottles to all your appointments in the pain clinic.  To cancel or reschedule your appointment  with Pain Management please remember to call 24 hours in advance to avoid a fee.  Refer to the educational materials which you have been given on: General Risks, I had my Procedure. Discharge Instructions, Post Sedation.  Post Procedure Instructions:  The drugs you were given will stay in your system until tomorrow, so for the next 24 hours you should not drive, make any legal decisions or drink any alcoholic beverages.  You may eat anything you prefer, but it is better to start with liquids then soups and crackers, and gradually work up to solid foods.  Please notify your doctor immediately if you have any unusual bleeding, trouble breathing or pain that is not related to your normal pain.  Depending on the type of procedure that was done, some parts of your body may feel week and/or numb.  This usually clears up by tonight or the next day.  Walk with the use of an assistive device or accompanied by an adult for the 24 hours.  You may use ice on the affected area for the first 24 hours.  Put ice in a Ziploc bag and cover with a towel and place against area 15 minutes on 15 minutes off.  You may switch to heat after 24 hours.GENERAL RISKS AND COMPLICATIONS  What are the risk, side effects and possible complications? Generally speaking, most procedures are safe.  However, with any procedure there are risks, side effects, and the possibility of complications.  The risks and complications are dependent upon the sites that are lesioned, or the type of nerve block to be performed.  The closer the procedure is to the spine, the more serious the risks are.  Great care is taken when placing the radio frequency needles, block needles or lesioning probes, but sometimes complications  can occur. 1. Infection: Any time there is an injection through the skin, there is a risk of infection.  This is why sterile conditions are used for these blocks.  There are four possible types of infection. 1. Localized skin  infection. 2. Central Nervous System Infection-This can be in the form of Meningitis, which can be deadly. 3. Epidural Infections-This can be in the form of an epidural abscess, which can cause pressure inside of the spine, causing compression of the spinal cord with subsequent paralysis. This would require an emergency surgery to decompress, and there are no guarantees that the patient would recover from the paralysis. 4. Discitis-This is an infection of the intervertebral discs.  It occurs in about 1% of discography procedures.  It is difficult to treat and it may lead to surgery.        2. Pain: the needles have to go through skin and soft tissues, will cause soreness.       3. Damage to internal structures:  The nerves to be lesioned may be near blood vessels or    other nerves which can be potentially damaged.       4. Bleeding: Bleeding is more common if the patient is taking blood thinners such as  aspirin, Coumadin, Ticiid, Plavix, etc., or if he/she have some genetic predisposition  such as hemophilia. Bleeding into the spinal canal can cause compression of the spinal  cord with subsequent paralysis.  This would require an emergency surgery to  decompress and there are no guarantees that the patient would recover from the  paralysis.       5. Pneumothorax:  Puncturing of a lung is a possibility, every time a needle is introduced in  the area of the chest or upper back.  Pneumothorax refers to free air around the  collapsed lung(s), inside of the thoracic cavity (chest cavity).  Another two possible  complications related to a similar event would include: Hemothorax and Chylothorax.   These are variations of the Pneumothorax, where instead of air around the collapsed  lung(s), you may have blood or chyle, respectively.       6. Spinal headaches: They may occur with any procedures in the area of the spine.       7. Persistent CSF (Cerebro-Spinal Fluid) leakage: This is a rare problem, but may occur   with prolonged intrathecal or epidural catheters either due to the formation of a fistulous  track or a dural tear.       8. Nerve damage: By working so close to the spinal cord, there is always a possibility of  nerve damage, which could be as serious as a permanent spinal cord injury with  paralysis.       9. Death:  Although rare, severe deadly allergic reactions known as "Anaphylactic  reaction" can occur to any of the medications used.      10. Worsening of the symptoms:  We can always make thing worse.  What are the chances of something like this happening? Chances of any of this occuring are extremely low.  By statistics, you have more of a chance of getting killed in a motor vehicle accident: while driving to the hospital than any of the above occurring .  Nevertheless, you should be aware that they are possibilities.  In general, it is similar to taking a shower.  Everybody knows that you can slip, hit your head and get killed.  Does that mean that you should not shower  again?  Nevertheless always keep in mind that statistics do not mean anything if you happen to be on the wrong side of them.  Even if a procedure has a 1 (one) in a 1,000,000 (million) chance of going wrong, it you happen to be that one..Also, keep in mind that by statistics, you have more of a chance of having something go wrong when taking medications.  Who should not have this procedure? If you are on a blood thinning medication (e.g. Coumadin, Plavix, see list of "Blood Thinners"), or if you have an active infection going on, you should not have the procedure.  If you are taking any blood thinners, please inform your physician.  How should I prepare for this procedure?  Do not eat or drink anything at least six hours prior to the procedure.  Bring a driver with you .  It cannot be a taxi.  Come accompanied by an adult that can drive you back, and that is strong enough to help you if your legs get weak or numb from the  local anesthetic.  Take all of your medicines the morning of the procedure with just enough water to swallow them.  If you have diabetes, make sure that you are scheduled to have your procedure done first thing in the morning, whenever possible.  If you have diabetes, take only half of your insulin dose and notify our nurse that you have done so as soon as you arrive at the clinic.  If you are diabetic, but only take blood sugar pills (oral hypoglycemic), then do not take them on the morning of your procedure.  You may take them after you have had the procedure.  Do not take aspirin or any aspirin-containing medications, at least eleven (11) days prior to the procedure.  They may prolong bleeding.  Wear loose fitting clothing that may be easy to take off and that you would not mind if it got stained with Betadine or blood.  Do not wear any jewelry or perfume  Remove any nail coloring.  It will interfere with some of our monitoring equipment.  NOTE: Remember that this is not meant to be interpreted as a complete list of all possible complications.  Unforeseen problems may occur.  BLOOD THINNERS The following drugs contain aspirin or other products, which can cause increased bleeding during surgery and should not be taken for 2 weeks prior to and 1 week after surgery.  If you should need take something for relief of minor pain, you may take acetaminophen which is found in Tylenol,m Datril, Anacin-3 and Panadol. It is not blood thinner. The products listed below are.  Do not take any of the products listed below in addition to any listed on your instruction sheet.  A.P.C or A.P.C with Codeine Codeine Phosphate Capsules #3 Ibuprofen Ridaura  ABC compound Congesprin Imuran rimadil  Advil Cope Indocin Robaxisal  Alka-Seltzer Effervescent Pain Reliever and Antacid Coricidin or Coricidin-D  Indomethacin Rufen  Alka-Seltzer plus Cold Medicine Cosprin Ketoprofen S-A-C Tablets  Anacin Analgesic  Tablets or Capsules Coumadin Korlgesic Salflex  Anacin Extra Strength Analgesic tablets or capsules CP-2 Tablets Lanoril Salicylate  Anaprox Cuprimine Capsules Levenox Salocol  Anexsia-D Dalteparin Magan Salsalate  Anodynos Darvon compound Magnesium Salicylate Sine-off  Ansaid Dasin Capsules Magsal Sodium Salicylate  Anturane Depen Capsules Marnal Soma  APF Arthritis pain formula Dewitt's Pills Measurin Stanback  Argesic Dia-Gesic Meclofenamic Sulfinpyrazone  Arthritis Bayer Timed Release Aspirin Diclofenac Meclomen Sulindac  Arthritis pain formula Anacin Dicumarol  Medipren Supac  Analgesic (Safety coated) Arthralgen Diffunasal Mefanamic Suprofen  Arthritis Strength Bufferin Dihydrocodeine Mepro Compound Suprol  Arthropan liquid Dopirydamole Methcarbomol with Aspirin Synalgos  ASA tablets/Enseals Disalcid Micrainin Tagament  Ascriptin Doan's Midol Talwin  Ascriptin A/D Dolene Mobidin Tanderil  Ascriptin Extra Strength Dolobid Moblgesic Ticlid  Ascriptin with Codeine Doloprin or Doloprin with Codeine Momentum Tolectin  Asperbuf Duoprin Mono-gesic Trendar  Aspergum Duradyne Motrin or Motrin IB Triminicin  Aspirin plain, buffered or enteric coated Durasal Myochrisine Trigesic  Aspirin Suppositories Easprin Nalfon Trillsate  Aspirin with Codeine Ecotrin Regular or Extra Strength Naprosyn Uracel  Atromid-S Efficin Naproxen Ursinus  Auranofin Capsules Elmiron Neocylate Vanquish  Axotal Emagrin Norgesic Verin  Azathioprine Empirin or Empirin with Codeine Normiflo Vitamin E  Azolid Emprazil Nuprin Voltaren  Bayer Aspirin plain, buffered or children's or timed BC Tablets or powders Encaprin Orgaran Warfarin Sodium  Buff-a-Comp Enoxaparin Orudis Zorpin  Buff-a-Comp with Codeine Equegesic Os-Cal-Gesic   Buffaprin Excedrin plain, buffered or Extra Strength Oxalid   Bufferin Arthritis Strength Feldene Oxphenbutazone   Bufferin plain or Extra Strength Feldene Capsules Oxycodone with Aspirin    Bufferin with Codeine Fenoprofen Fenoprofen Pabalate or Pabalate-SF   Buffets II Flogesic Panagesic   Buffinol plain or Extra Strength Florinal or Florinal with Codeine Panwarfarin   Buf-Tabs Flurbiprofen Penicillamine   Butalbital Compound Four-way cold tablets Penicillin   Butazolidin Fragmin Pepto-Bismol   Carbenicillin Geminisyn Percodan   Carna Arthritis Reliever Geopen Persantine   Carprofen Gold's salt Persistin   Chloramphenicol Goody's Phenylbutazone   Chloromycetin Haltrain Piroxlcam   Clmetidine heparin Plaquenil   Cllnoril Hyco-pap Ponstel   Clofibrate Hydroxy chloroquine Propoxyphen         Before stopping any of these medications, be sure to consult the physician who ordered them.  Some, such as Coumadin (Warfarin) are ordered to prevent or treat serious conditions such as "deep thrombosis", "pumonary embolisms", and other heart problems.  The amount of time that you may need off of the medication may also vary with the medication and the reason for which you were taking it.  If you are taking any of these medications, please make sure you notify your pain physician before you undergo any procedures.         Selective Nerve Root Block Patient Information  Description: Specific nerve roots exit the spinal canal and these nerves can be compressed and inflamed by a bulging disc and bone spurs.  By injecting steroids on the nerve root, we can potentially decrease the inflammation surrounding these nerves, which often leads to decreased pain.  Also, by injecting local anesthesia on the nerve root, this can provide [place] helpful information to give to your referring doctor if it decreases your pain.  Selective nerve root blocks can be done along the spine from the neck to the low back depending on the location of your pain.   After numbing the skin with local anesthesia, a small needle is passed to the nerve root and the position of the needle is verified using x-ray pictures.   After the needle is in correct position, we then deposit the medication.  You may experience a pressure sensation while this is being done.  The entire block usually lasts less than 15 minutes.  Conditions that may be treated with selective nerve root blocks:  Low back and leg pain  Spinal stenosis  Diagnostic block prior to potential surgery  Neck and arm pain  Post laminectomy syndrome  Preparation for the injection:  6. Do  not eat any solid food or dairy products within 6 hours of your appointment. 7. You may drink clear liquids up to 2 hours before an appointment.  Clear liquids include water, black coffee, juice or soda.  No milk or cream please. 8. You may take your regular medications, including pain medications, with a sip of water before your appointment.  Diabetics should hold regular insulin (if taken separately) and take 1/2 normal NPH dose the morning of the procedure.  Carry some sugar containing items with you to your appointment. 9. A driver must accompany you and be prepared to drive you home after your procedure. 10. Bring all your current medications with you. 11. An IV may be inserted and sedation may be given at the discretion of the physician. 12. A blood pressure cuff, EKG, and other monitors will often be applied during the procedure.  Some patients may need to have extra oxygen administered for a short period. 18. You will be asked to provide medical information, including allergies, prior to the procedure.  We must know immediately if you are taking blood  Thinners (like Coumadin) or if you are allergic to IV iodine contrast (dye).  Possible side-effects: All are usually temporary  Bleeding from needle site  Light headedness  Numbness and tingling  Decreased blood pressure  Weakness in arms/legs  Pressure sensation in back/neck  Pain at injection site (several days)  Possible complications: All are extremely rare  Infection  Nerve  injury  Spinal headache (a headache wore with upright position)  Call if you experience:  Fever/chills associated with headache or increased back/neck pain  Headache worsened by an upright position  New onset weakness or numbness of an extremity below the injection site  Hives or difficulty breathing (go to the emergency room)  Inflammation or drainage at the injection site(s)  Severe back/neck pain greater than usual  New symptoms which are concerning to you  Please note:  Although the local anesthetic injected can often make your back or neck feel good for several hours after the injection the pain will likely return.  It takes 3-5 days for steroids to work on the nerve root. You may not notice any pain relief for at least one week.  If effective, we will often do a series of 3 injections spaced 3-6 weeks apart to maximally decrease your pain.    If you have any questions, please call [phone] [facility] Dis[facility] Inst[facility]  General Discharge Instructions :  If you need to reach your doctor call: Monday-Friday 8:00 am - 4:00 pm at [phone] or toll free [phone].  After clinic hours [phone] to have operator reach doctor.  Bring all of your medication bottles to all your appointments in the pain clinic.  To cancel or reschedule your appointment with Pain Management please remember to call 24 hours in advance to avoid a fee.  Refer to the educational materials which you have been given on: General Risks, I had my Procedure. Discharge Instructions, Post Sedation.  Post Procedure Instructions:  The drugs you were given will stay in your system until tomorrow, so for the next 24 hours you should not drive, make any legal decisions or drink any alcoholic beverages.  You may eat anything you prefer, but it is better to start with liquids then soups and crackers, and gradually work up to solid foods.  Please  notify your doctor immediately if you have any unusual bleeding, trouble breathing or pain that  is not related to your normal pain.  Depending on the type of procedure that was done, some parts of your body may feel week and/or numb.  This usually clears up by tonight or the next day.  Walk with the use of an assistive device or accompanied by an adult for the 24 hours.  You may use ice on the affected area for the first 24 hours.  Put ice in a Ziploc bag and cover with a towel and place against area 15 minutes on 15 minutes off.  You may switch to heat after 24 hours.

## 2015-04-17 NOTE — Progress Notes (Signed)
Subjective:    Patient ID: Karen Edwards, female    DOB: 20-Aug-1966, 49 y.o.   MRN: [id number]  HPI  PROCEDURE PERFORMED: Lumbosacral selective nerve root block   NOTE: The patient is a 49 y.o. female who returns to [place] for further evaluation and treatment of pain involving the lumbar and lower extremity region. Studies consisting of  MRI has revealed the patient to be with evidence of  Degenerative changes of the lumbar spine L1-2 level degenerative disc disease with right central disc extrusion, cranial migration of disc material with multilevel degenerative changes noted throughout the lumbar spine. There is concern regarding intraspinal abnormalities contributing to the patient's symptomatology. The risks, benefits, and expectations of the procedure have been explained to the patient who was understanding and in agreement with suggested treatment plan. We will proceed with interventional treatment as discussed and as explained to the patient. The patient is understanding and in agreement with suggested treatment plan.   DESCRIPTION OF PROCEDURE: Lumbosacral selective nerve root block with IV Versed, IV fentanyl conscious sedation, EKG, blood pressure, pulse, and pulse oximetry monitoring. The procedure was performed with the patient in the prone position under fluoroscopic guidance. With the patient in the prone position, Betadine prep of proposed entry site was performed. Local anesthetic skin wheal of proposed needle entry site was prepared with 1.5% plain lidocaine with AP view of the lumbosacral spine.   PROCEDURE #1: Needle placement at the L 3 vertebral body: A 22 -gauge needle was inserted at the inferior border of the transverse process of the vertebral body with needle placed medial to the midline of the transverse process on AP view of the lumbosacral spine   NEEDLE PLACEMENT AT L 4 and L5 on the left side   Needle placement was accomplished at L4 and L5 on the  left side exactly as was accomplished at the L3 vertebral body level on the left side and under fluoroscopic guidance as utilized on the left side .  PROCEDURE #4: Needle placement at the S1 foramen. With the patient in the prone position with Betadine prep of proposed entry site accomplished, the S1 foramen was visualized under fluoroscopic guidance with AP view of the lumbosacral spine with cephalad orientation of the fluoroscope with local anesthetic skin wheal of 1.5% lidocaine of proposed needle entry site prepared. A 22-gauge needle was inserted S1 foramen under fluoroscopic guidance eliciting paresthesias radiating from the buttocks to the lower extremity after which needle was slightly withdrawn.   Needle placement was then verified on lateral view at all levels with needle tip documented to be in the posterior superior quadrant of the intervertebral foramen of  L  3, L4 , and L5, and needle tip documented at the level of the S1 foramen. Following negative aspiration for heme and CSF at each level, each level was injected with 3 mL of 0.25% bupivacaine with Kenalog.    LUMBOSACRAL SELECTIVE NERVE ROOT BLOCKS ON THE RIGHT SIDE  The procedure was performed on the right side at all levels as performed on the left side under fluoroscopic technique and utilizing the identical technique is utilized on the left side    The patient tolerated the procedure well. A total of 10 mg of Kenalog was utilized for the procedure.   PLAN:  1. Medications: Will continue presently prescribed medications. 2. The patient is to undergo follow-up evaluation with for evaluation of blood pressure and general medical condition status post procedure performed on today's visit.  3. Surgical follow-up evaluation. 4. Neurological evaluation. 5. May consider radiofrequency procedures, implantation type procedures and other treatment pending response to treatment and follow-up evaluation. 6. The patient has been advise do  adhere to proper body mechanics and avoid activities which may aggravate condition. 7. The patient has been advised to call the Pain Management Center prior to scheduled return appointment should there be significant change in the patient's condition or should the patient have other concerns regarding condition prior to scheduled return appointment.      Review of Systems     Objective:   Physical Exam        Assessment & Plan:

## 2015-04-17 NOTE — Progress Notes (Signed)
Safety precautions to be maintained throughout the outpatient stay will include: orient to surroundings, keep bed in low position, maintain call bell within reach at all times, provide assistance with transfer out of bed and ambulation.  

## 2015-04-18 ENCOUNTER — Telehealth: Payer: Self-pay | Admitting: *Deleted

## 2015-04-18 NOTE — Telephone Encounter (Signed)
Patient denies any problems post procedure.  

## 2015-04-19 HISTORY — PX: COLONOSCOPY: SHX174

## 2015-04-19 LAB — HM COLONOSCOPY

## 2015-05-04 ENCOUNTER — Encounter: Payer: PPO | Admitting: Pain Medicine

## 2015-05-12 ENCOUNTER — Other Ambulatory Visit: Payer: Self-pay

## 2015-05-12 NOTE — Telephone Encounter (Signed)
Pt left v/m requesting refill tizanidine to [facility] [address]. rx last printed # 80 on 04/06/15 and pt last seen 03/01/15. Allie Bossier NP out of office.Please advise.

## 2015-05-14 MED ORDER — TIZANIDINE HCL 2 MG PO CAPS: ORAL_CAPSULE | ORAL | Status: DC

## 2015-05-14 NOTE — Telephone Encounter (Signed)
Sent. Thanks.   

## 2015-05-18 ENCOUNTER — Ambulatory Visit: Payer: PPO | Attending: Pain Medicine | Admitting: Pain Medicine

## 2015-05-18 VITALS — BP 117/60 | HR 71 | Temp 98.3°F | Resp 16 | Ht 60.0 in | Wt 135.0 lb

## 2015-05-18 DIAGNOSIS — M5416 Radiculopathy, lumbar region: Secondary | ICD-10-CM

## 2015-05-18 DIAGNOSIS — M545 Low back pain: Secondary | ICD-10-CM | POA: Diagnosis present

## 2015-05-18 DIAGNOSIS — M79604 Pain in right leg: Secondary | ICD-10-CM | POA: Diagnosis present

## 2015-05-18 DIAGNOSIS — M79605 Pain in left leg: Secondary | ICD-10-CM | POA: Diagnosis present

## 2015-05-18 DIAGNOSIS — M5136 Other intervertebral disc degeneration, lumbar region: Secondary | ICD-10-CM | POA: Insufficient documentation

## 2015-05-18 DIAGNOSIS — M533 Sacrococcygeal disorders, not elsewhere classified: Secondary | ICD-10-CM | POA: Insufficient documentation

## 2015-05-18 DIAGNOSIS — M5030 Other cervical disc degeneration, unspecified cervical region: Secondary | ICD-10-CM | POA: Insufficient documentation

## 2015-05-18 DIAGNOSIS — M47896 Other spondylosis, lumbar region: Secondary | ICD-10-CM | POA: Insufficient documentation

## 2015-05-18 DIAGNOSIS — M5137 Other intervertebral disc degeneration, lumbosacral region: Secondary | ICD-10-CM

## 2015-05-18 DIAGNOSIS — M5481 Occipital neuralgia: Secondary | ICD-10-CM | POA: Insufficient documentation

## 2015-05-18 DIAGNOSIS — G90521 Complex regional pain syndrome I of right lower limb: Secondary | ICD-10-CM

## 2015-05-18 MED ORDER — HYDROCODONE-ACETAMINOPHEN 5-325 MG PO TABS: ORAL_TABLET | ORAL | Status: DC

## 2015-05-18 NOTE — Progress Notes (Signed)
Safety precautions to be maintained throughout the outpatient stay will include: orient to surroundings, keep bed in low position, maintain call bell within reach at all times, provide assistance with transfer out of bed and ambulation.  

## 2015-05-18 NOTE — Progress Notes (Signed)
Subjective:    Patient ID: Karen Edwards, female    DOB: 09-15-1966, 49 y.o.   MRN: [id number]  HPI Patient is 49 year old female [facility] further evaluation and treatment of pain involving the lower back lower extremity region. Patient states that she has significant pain of the lower back lower extremity region associated with weakness of the lower extremity  requiring patient to ambulate with cane we discussed patient's condition including neurosurgical reevaluation. We will proceed interventional treatment at time return appointment in attempt to decrease severity of patient's symptoms, minimize progression of symptoms, and avoid the need for more involved treatment. She also with pain involving the neck with pain radiating from the upper back region to the neck precipitating headaches. Patient will be considered for further studies of the cervical region as well as neurological evaluation in this regard. Present time we'll proceed with interventional treatment for lower back lower extremity pain and we'll consider additional modification of treatment pending follow-up evaluation. The patient was understanding and agreement status treatment plan.     Review of Systems     Objective:   Physical Exam   There was moderate tends to palpation of the splenius capitis and occipitalis musculature region. Palpation of these regions reproduced significant pain radiating toward the occipital region palpation of the sternocleidomastoid musculature region and the trapezius musculature regions were with increase tennis to palpation of radiation of pain of the occipital region as well. There was unremarkable Spurling's maneuver. Tinel and Phalen's maneuver were without increased pain of significant degree. Palpation of the acromioclavicular glenohumeral joint region was without increased pain of significant degree. Palpation over the cervical facet cervical paraspinal  musculature region reproduced moderate discomfort. There was tends to palpation of the thoracic facet thoracic paraspinal muscles region with no crepitus of the thoracic region noted. Palpation over the lumbar paraspinal muscle lumbar facet region associated with moderate to moderately severe discomfort. Lateral bending and rotation and extension to palpation lumbar facets reproduce moderate to moderately severe discomfort. There was decreased straight leg raising on the left compared to the right with decreased EHL strength on the left There was decreased EHL strength on the right as well as significant increase of pain. There was negative clonus negative Homans DTRs were difficult to elicit patient had difficulty relaxing The  abdomen was nontender and no costovertebral tenderness was noted    Assessment & Plan:   Progress Notes   Karen Edwards (MR# [id number])      Progress Notes Info    Author Note Status Last Update User Last Update Date/Time   Mohammed Kindle, MD Signed Mohammed Kindle, MD 04/06/2015 4:12 PM    Progress Notes    Expand All Collapse All     Subjective:    Patient ID: Karen Edwards, female DOB: [birth year], 49 y.o. MRN: [id number]  HPI  Patient is 49 year old female returns to [place] for further evaluation and treatment of pain involving the lumbar lower extremity region. Patient also with some pain occurring in the region of the upper back region and base of the neck. Patient denies trauma change in events of daily living the call significant changes of the pathology. Patient states that the pain is aggravated by standing walking twisting turning maneuvers and becomes more intense as the day progresses. We discussed patient's condition and will consider patient interventional treatment at time return appointment as discussed. Patient tolerating medications well without undesirable side effects. The patient was understanding  and agree with  suggested treatment plan.  Review of Systems     Objective:   Physical Exam  Was tenderness of the splenius capitis and occipitalis musculature region palpation of which reproduced mild discomfort there was mild tends to palpation over the region of the left and right splenius capitis muscles. Palpation over the cervical facet cervical paraspinal musculature region was a tends to palpation of mild degree. There was moderate tends to palpation of the trapezius musculature region. There was unremarkable Spurling's maneuver. Patient appeared to be with bilaterally equal grip strength and Tinel and Phalen's maneuver without increased pain of any significant degree. Palpation over the thoracic facet thoracic paraspinal musculature region was a tends to palpation. No crepitus of the thoracic region was noted. Palpation over the lumbar paraspinal muscles lumbar facet region associated tends to palpation of moderate moderately severe discomfort. Lateral bending and rotation and extension to palpation of the lumbar facets reproduce moderate to moderately severe discomfort. Figure-of-eight tolerated to 30 without increased pain with dorsiflexion noted. Negative clonus negative Homans. DTRs trace at the knees. No definite sensory deficit of dermatomal distribution detected. Abdomen nontender and no costovertebral maintenance noted.     Assessment & Plan:    Degenerative disc disease lumbar spine Degenerative changes lumbar spine L1-2 level degenerative disc disease with right central disc extrusion cranial migration of disc material  Lumbar facet syndrome  Lumbar radiculopathy  Sacroiliac joint dysfunction  Bilateral occipital neuralgia  Degenerative disc disease cervical spine    Plan   Continue present medications.hydrocodone acetaminophen..  Lumbosacral selective nerve root blocks to be performed at time return appointment  F/U PCP Dr. Carlis Abbott for evaliation of BP and general medical  condition.  F/U surgical evaluation. We have discussed.neurosurgical reevaluation for further assessment of lower back lower extremity pain and weakness  F/U neurological evaluation. We have discussed neurological reevaluation for further assessment of lower back lower extremity pain paresthesias and weakness.  May consider radiofrequency rhizolysis or intraspinal procedures pending response to present treatment and F/U evaluation.  Patient to call Pain Management Center should patient have concerns prior to scheduled return appointment.

## 2015-05-18 NOTE — Patient Instructions (Addendum)
Continue present medication hydrocodone acetaminophen  Lumbosacral selective nerve roGENERAL RISKS AND COMPLICATIONS  What are the risk, side effects and possible complications? Generally speaking, most procedures are safe.  However, with any procedure there are risks, side effects, and the possibility of complications.  The risks and complications are dependent upon the sites that are lesioned, or the type of nerve block to be performed.  The closer the procedure is to the spine, the more serious the risks are.  Great care is taken when placing the radio frequency needles, block needles or lesioning probes, but sometimes complications can occur. 1. Infection: Any time there is an injection through the skin, there is a risk of infection.  This is why sterile conditions are used for these blocks.  There are four possible types of infection. 1. Localized skin infection. 2. Central Nervous System Infection-This can be in the form of Meningitis, which can be deadly. 3. Epidural Infections-This can be in the form of an epidural abscess, which can cause pressure inside of the spine, causing compression of the spinal cord with subsequent paralysis. This would require an emergency surgery to decompress, and there are no guarantees that the patient would recover from the paralysis. 4. Discitis-This is an infection of the intervertebral discs.  It occurs in about 1% of discography procedures.  It is difficult to treat and it may lead to surgery.        2. Pain: the needles have to go through skin and soft tissues, will cause soreness.       3. Damage to internal structures:  The nerves to be lesioned may be near blood vessels or    other nerves which can be potentially damaged.       4. Bleeding: Bleeding is more common if the patient is taking blood thinners such as  aspirin, Coumadin, Ticiid, Plavix, etc., or if he/she have some genetic predisposition  such as hemophilia. Bleeding into the spinal canal can  cause compression of the spinal  cord with subsequent paralysis.  This would require an emergency surgery to  decompress and there are no guarantees that the patient would recover from the  paralysis.       5. Pneumothorax:  Puncturing of a lung is a possibility, every time a needle is introduced in  the area of the chest or upper back.  Pneumothorax refers to free air around the  collapsed lung(s), inside of the thoracic cavity (chest cavity).  Another two possible  complications related to a similar event would include: Hemothorax and Chylothorax.   These are variations of the Pneumothorax, where instead of air around the collapsed  lung(s), you may have blood or chyle, respectively.       6. Spinal headaches: They may occur with any procedures in the area of the spine.       7. Persistent CSF (Cerebro-Spinal Fluid) leakage: This is a rare problem, but may occur  with prolonged intrathecal or epidural catheters either due to the formation of a fistulous  track or a dural tear.       8. Nerve damage: By working so close to the spinal cord, there is always a possibility of  nerve damage, which could be as serious as a permanent spinal cord injury with  paralysis.       9. Death:  Although rare, severe deadly allergic reactions known as "Anaphylactic  reaction" can occur to any of the medications used.      10. Worsening of  the symptoms:  We can always make thing worse.  What are the chances of something like this happening? Chances of any of this occuring are extremely low.  By statistics, you have more of a chance of getting killed in a motor vehicle accident: while driving to the hospital than any of the above occurring .  Nevertheless, you should be aware that they are possibilities.  In general, it is similar to taking a shower.  Everybody knows that you can slip, hit your head and get killed.  Does that mean that you should not shower again?  Nevertheless always keep in mind that statistics do not mean  anything if you happen to be on the wrong side of them.  Even if a procedure has a 1 (one) in a 1,000,000 (million) chance of going wrong, it you happen to be that one..Also, keep in mind that by statistics, you have more of a chance of having something go wrong when taking medications.  Who should not have this procedure? If you are on a blood thinning medication (e.g. Coumadin, Plavix, see list of "Blood Thinners"), or if you have an active infection going on, you should not have the procedure.  If you are taking any blood thinners, please inform your physician.  How should I prepare for this procedure?  Do not eat or drink anything at least six hours prior to the procedure.  Bring a driver with you .  It cannot be a taxi.  Come accompanied by an adult that can drive you back, and that is strong enough to help you if your legs get weak or numb from the local anesthetic.  Take all of your medicines the morning of the procedure with just enough water to swallow them.  If you have diabetes, make sure that you are scheduled to have your procedure done first thing in the morning, whenever possible.  If you have diabetes, take only half of your insulin dose and notify our nurse that you have done so as soon as you arrive at the clinic.  If you are diabetic, but only take blood sugar pills (oral hypoglycemic), then do not take them on the morning of your procedure.  You may take them after you have had the procedure.  Do not take aspirin or any aspirin-containing medications, at least eleven (11) days prior to the procedure.  They may prolong bleeding.  Wear loose fitting clothing that may be easy to take off and that you would not mind if it got stained with Betadine or blood.  Do not wear any jewelry or perfume  Remove any nail coloring.  It will interfere with some of our monitoring equipment.  NOTE: Remember that this is not meant to be interpreted as a complete list of all possible  complications.  Unforeseen problems may occur.  BLOOD THINNERS The following drugs contain aspirin or other products, which can cause increased bleeding during surgery and should not be taken for 2 weeks prior to and 1 week after surgery.  If you should need take something for relief of minor pain, you may take acetaminophen which is found in Tylenol,m Datril, Anacin-3 and Panadol. It is not blood thinner. The products listed below are.  Do not take any of the products listed below in addition to any listed on your instruction sheet.  A.P.C or A.P.C with Codeine Codeine Phosphate Capsules #3 Ibuprofen Ridaura  ABC compound Congesprin Imuran rimadil  Advil Cope Indocin Robaxisal  Alka-Seltzer Effervescent Pain  Reliever and Antacid Coricidin or Coricidin-D  Indomethacin Rufen  Alka-Seltzer plus Cold Medicine Cosprin Ketoprofen S-A-C Tablets  Anacin Analgesic Tablets or Capsules Coumadin Korlgesic Salflex  Anacin Extra Strength Analgesic tablets or capsules CP-2 Tablets Lanoril Salicylate  Anaprox Cuprimine Capsules Levenox Salocol  Anexsia-D Dalteparin Magan Salsalate  Anodynos Darvon compound Magnesium Salicylate Sine-off  Ansaid Dasin Capsules Magsal Sodium Salicylate  Anturane Depen Capsules Marnal Soma  APF Arthritis pain formula Dewitt's Pills Measurin Stanback  Argesic Dia-Gesic Meclofenamic Sulfinpyrazone  Arthritis Bayer Timed Release Aspirin Diclofenac Meclomen Sulindac  Arthritis pain formula Anacin Dicumarol Medipren Supac  Analgesic (Safety coated) Arthralgen Diffunasal Mefanamic Suprofen  Arthritis Strength Bufferin Dihydrocodeine Mepro Compound Suprol  Arthropan liquid Dopirydamole Methcarbomol with Aspirin Synalgos  ASA tablets/Enseals Disalcid Micrainin Tagament  Ascriptin Doan's Midol Talwin  Ascriptin A/D Dolene Mobidin Tanderil  Ascriptin Extra Strength Dolobid Moblgesic Ticlid  Ascriptin with Codeine Doloprin or Doloprin with Codeine Momentum Tolectin  Asperbuf Duoprin  Mono-gesic Trendar  Aspergum Duradyne Motrin or Motrin IB Triminicin  Aspirin plain, buffered or enteric coated Durasal Myochrisine Trigesic  Aspirin Suppositories Easprin Nalfon Trillsate  Aspirin with Codeine Ecotrin Regular or Extra Strength Naprosyn Uracel  Atromid-S Efficin Naproxen Ursinus  Auranofin Capsules Elmiron Neocylate Vanquish  Axotal Emagrin Norgesic Verin  Azathioprine Empirin or Empirin with Codeine Normiflo Vitamin E  Azolid Emprazil Nuprin Voltaren  Bayer Aspirin plain, buffered or children's or timed BC Tablets or powders Encaprin Orgaran Warfarin Sodium  Buff-a-Comp Enoxaparin Orudis Zorpin  Buff-a-Comp with Codeine Equegesic Os-Cal-Gesic   Buffaprin Excedrin plain, buffered or Extra Strength Oxalid   Bufferin Arthritis Strength Feldene Oxphenbutazone   Bufferin plain or Extra Strength Feldene Capsules Oxycodone with Aspirin   Bufferin with Codeine Fenoprofen Fenoprofen Pabalate or Pabalate-SF   Buffets II Flogesic Panagesic   Buffinol plain or Extra Strength Florinal or Florinal with Codeine Panwarfarin   Buf-Tabs Flurbiprofen Penicillamine   Butalbital Compound Four-way cold tablets Penicillin   Butazolidin Fragmin Pepto-Bismol   Carbenicillin Geminisyn Percodan   Carna Arthritis Reliever Geopen Persantine   Carprofen Gold's salt Persistin   Chloramphenicol Goody's Phenylbutazone   Chloromycetin Haltrain Piroxlcam   Clmetidine heparin Plaquenil   Cllnoril Hyco-pap Ponstel   Clofibrate Hydroxy chloroquine Propoxyphen         Before stopping any of these medications, be sure to consult the physician who ordered them.  Some, such as Coumadin (Warfarin) are ordered to prevent or treat serious conditions such as "deep thrombosis", "pumonary embolisms", and other heart problems.  The amount of time that you may need off of the medication may also vary with the medication and the reason for which you were taking it.  If you are taking any of these medications, please  make sure you notify your pain physician before you undergo any procedures.         Selective Nerve Root Block Patient Information  Description: Specific nerve roots exit the spinal canal and these nerves can be compressed and inflamed by a bulging disc and bone spurs.  By injecting steroids on the nerve root, we can potentially decrease the inflammation surrounding these nerves, which often leads to decreased pain.  Also, by injecting local anesthesia on the nerve root, this can provide [place] helpful information to give to your referring doctor if it decreases your pain.  Selective nerve root blocks can be done along the spine from the neck to the low back depending on the location of your pain.   After numbing the skin with local anesthesia,  a small needle is passed to the nerve root and the position of the needle is verified using x-ray pictures.  After the needle is in correct position, we then deposit the medication.  You may experience a pressure sensation while this is being done.  The entire block usually lasts less than 15 minutes.  Conditions that may be treated with selective nerve root blocks:  Low back and leg pain  Spinal stenosis  Diagnostic block prior to potential surgery  Neck and arm pain  Post laminectomy syndrome  Preparation for the injection:  1. Do not eat any solid food or dairy products within 6 hours of your appointment. 2. You may drink clear liquids up to 2 hours before an appointment.  Clear liquids include water, black coffee, juice or soda.  No milk or cream please. 3. You may take your regular medications, including pain medications, with a sip of water before your appointment.  Diabetics should hold regular insulin (if taken separately) and take 1/2 normal NPH dose the morning of the procedure.  Carry some sugar containing items with you to your appointment. 4. A driver must accompany you and be prepared to drive you home after your procedure. 5. Bring  all your current medications with you. 6. An IV may be inserted and sedation may be given at the discretion of the physician. 7. A blood pressure cuff, EKG, and other monitors will often be applied during the procedure.  Some patients may need to have extra oxygen administered for a short period. 8. You will be asked to provide medical information, including allergies, prior to the procedure.  We must know immediately if you are taking blood  Thinners (like Coumadin) or if you are allergic to IV iodine contrast (dye).  Possible side-effects: All are usually temporary  Bleeding from needle site  Light headedness  Numbness and tingling  Decreased blood pressure  Weakness in arms/legs  Pressure sensation in back/neck  Pain at injection site (several days)  Possible complications: All are extremely rare  Infection  Nerve injury  Spinal headache (a headache wore with upright position)  Call if you experience:  Fever/chills associated with headache or increased back/neck pain  Headache worsened by an upright position  New onset weakness or numbness of an extremity below the injection site  Hives or difficulty breathing (go to the emergency room)  Inflammation or drainage at the injection site(s)  Severe back/neck pain greater than usual  New symptoms which are concerning to you  Please note:  Although the local anesthetic injected can often make your back or neck feel good for several hours after the injection the pain will likely return.  It takes 3-5 days for steroids to work on the nerve root. You may not notice any pain relief for at least one week.  If effective, we will often do a series of 3 injections spaced 3-6 weeks apart to maximally decrease your pain.    If you have any questions, please call [phone] [facility]2015  F/U PCP Dr. Carlis Abbott for evaliation of  BP and general  medical  condition.  F/U surgical evaluation  F/U neurological evaluation  May consider radiofrequency rhizolysis or intraspinal procedures pending response to present treatment and F/U evaluation.  Patient to call [facility] should patient have concerns prior to scheduled return appointment.

## 2015-05-23 ENCOUNTER — Encounter: Payer: PPO | Admitting: Pain Medicine

## 2015-05-24 ENCOUNTER — Other Ambulatory Visit: Payer: Self-pay | Admitting: Surgery

## 2015-05-24 DIAGNOSIS — R110 Nausea: Secondary | ICD-10-CM

## 2015-05-25 ENCOUNTER — Ambulatory Visit
Admission: RE | Admit: 2015-05-25 | Discharge: 2015-05-25 | Disposition: A | Discharge status: Home / Self Care | Payer: PPO | Source: Ambulatory Visit | Attending: Surgery | Admitting: Surgery

## 2015-05-25 DIAGNOSIS — R112 Nausea with vomiting, unspecified: Secondary | ICD-10-CM | POA: Diagnosis not present

## 2015-05-25 DIAGNOSIS — R110 Nausea: Secondary | ICD-10-CM

## 2015-05-29 ENCOUNTER — Ambulatory Visit: Payer: PPO | Attending: Pain Medicine | Admitting: Pain Medicine

## 2015-05-29 ENCOUNTER — Encounter: Payer: Self-pay | Admitting: Pain Medicine

## 2015-05-29 VITALS — BP 112/63 | HR 54 | Temp 98.4°F | Resp 14 | Ht 60.0 in | Wt 135.0 lb

## 2015-05-29 DIAGNOSIS — M5030 Other cervical disc degeneration, unspecified cervical region: Secondary | ICD-10-CM

## 2015-05-29 DIAGNOSIS — M47816 Spondylosis without myelopathy or radiculopathy, lumbar region: Secondary | ICD-10-CM | POA: Diagnosis not present

## 2015-05-29 DIAGNOSIS — M533 Sacrococcygeal disorders, not elsewhere classified: Secondary | ICD-10-CM

## 2015-05-29 DIAGNOSIS — M5137 Other intervertebral disc degeneration, lumbosacral region: Secondary | ICD-10-CM

## 2015-05-29 DIAGNOSIS — M545 Low back pain: Secondary | ICD-10-CM | POA: Diagnosis present

## 2015-05-29 DIAGNOSIS — M79604 Pain in right leg: Secondary | ICD-10-CM | POA: Diagnosis present

## 2015-05-29 DIAGNOSIS — M5481 Occipital neuralgia: Secondary | ICD-10-CM

## 2015-05-29 DIAGNOSIS — M5416 Radiculopathy, lumbar region: Secondary | ICD-10-CM

## 2015-05-29 DIAGNOSIS — M5136 Other intervertebral disc degeneration, lumbar region: Secondary | ICD-10-CM

## 2015-05-29 DIAGNOSIS — M5126 Other intervertebral disc displacement, lumbar region: Secondary | ICD-10-CM | POA: Insufficient documentation

## 2015-05-29 DIAGNOSIS — K589 Irritable bowel syndrome without diarrhea: Secondary | ICD-10-CM

## 2015-05-29 DIAGNOSIS — G90521 Complex regional pain syndrome I of right lower limb: Secondary | ICD-10-CM

## 2015-05-29 DIAGNOSIS — M79605 Pain in left leg: Secondary | ICD-10-CM | POA: Diagnosis present

## 2015-05-29 HISTORY — DX: Irritable bowel syndrome, unspecified: K58.9

## 2015-05-29 MED ORDER — TRIAMCINOLONE ACETONIDE 40 MG/ML IJ SUSP
INTRAMUSCULAR | Status: AC
  Administered 2015-05-29: 40 mg
  Filled 2015-05-29: qty 1

## 2015-05-29 MED ORDER — FENTANYL CITRATE (PF) 100 MCG/2ML IJ SOLN
INTRAMUSCULAR | Status: AC
  Administered 2015-05-29: 100 ug via INTRAVENOUS
  Filled 2015-05-29: qty 2

## 2015-05-29 MED ORDER — BUPIVACAINE HCL (PF) 0.25 % IJ SOLN
INTRAMUSCULAR | Status: AC
  Administered 2015-05-29: 30 mL
  Filled 2015-05-29: qty 30

## 2015-05-29 MED ORDER — MIDAZOLAM HCL 5 MG/5ML IJ SOLN
INTRAMUSCULAR | Status: AC
  Administered 2015-05-29: 3 mg via INTRAVENOUS
  Filled 2015-05-29: qty 5

## 2015-05-29 MED ORDER — ORPHENADRINE CITRATE 30 MG/ML IJ SOLN
INTRAMUSCULAR | Status: AC
  Administered 2015-05-29: 60 mg
  Filled 2015-05-29: qty 2

## 2015-05-29 NOTE — Progress Notes (Signed)
Subjective:    Patient ID: Karen Edwards, female    DOB: 1966-07-28, 49 y.o.   MRN: [id number]  HPI  PROCEDURE PERFORMED: Lumbosacral selective nerve root block   NOTE: The patient is a 49 y.o. female who returns to [facility] for further evaluation and treatment of pain involving the lumbar and lower extremity region. Studies consisting of MRI has revealed the patient to be with evidence of Degenerative disc disease lumbar spine Degenerative changes lumbar spine L1-2 level degenerative disc disease with right central disc extrusion cranial migration of disc mate. There is concern regarding intraspinal abnormalities contributing to the patient's symptomatology. The risks, benefits, and expectations of the procedure have been explained to the patient who was understanding and in agreement with suggested treatment plan. We will proceed with interventional treatment as discussed and as explained to the patient. The patient is understanding and in agreement with suggested treatment plan.   DESCRIPTION OF PROCEDURE: Left Lumbosacral selective nerve root block with IV Versed, IV fentanyl conscious sedation, EKG, blood pressure, pulse, and pulse oximetry monitoring. The procedure was performed with the patient in the prone position under fluoroscopic guidance. With the patient in the prone position, Betadine prep of proposed entry site was performed. Local anesthetic skin wheal of proposed needle entry site was prepared with 1.5% plain lidocaine with AP view of the lumbosacral spine.   PROCEDURE #1: Needle placement at the right L 2 vertebral body: A 22 -gauge needle was inserted at the inferior border of the transverse process of the vertebral body with needle placed medial to the midline of the transverse process on AP view of the lumbosacral spine.   NEEDLE PLACEMENT AT  right L3, L4, and L5 vertebral body levels   Needle  placement was accomplished at right L3, L4, and L5 vertebral  body levels  exactly as was accomplished at the L2 vertebral body level level utilizing the same technique and under fluoroscopic guidance.    Needle placement was then verified on lateral view at all levels with needle tip documented to be in the posterior superior quadrant of the intervertebral foramen of  L 2, L3, L4, and L5. Following negative aspiration for heme and CSF at each level, each level was injected with 3 mL of 0.25% bupivacaine with Kenalog.   Myoneural block injections of the cervical paraspinal musculature region Following Betadine prep of proposed entry site, a 22-gauge needle was inserted in the cervical paraspinal musculature region and following negative aspiration 2 cc of 0.25% bupivacaine with Norflex was injected for myoneural block injections of the cervical region 4    The patient tolerated the procedure well. A total of 10 mg of Kenalog was utilized for the procedure.   PLAN:  1. Medications: Will continue presently prescribed medication hydrocodone acetaminophen. 2. The patient is to undergo follow-up evaluation with PCP Dr. Carlis Abbott for evaluation of blood pressure and general medical condition status post procedure performed on today's visit. 3. Surgical follow-up evaluation. We have discussed and schedule neurosurgical reevaluation. 4. Neurological evaluation. 5. May consider radiofrequency procedures, implantation type procedures and other treatment pending response to treatment and follow-up evaluation. 6. The patient has been advise do adhere to proper body mechanics and avoid activities which may aggravate condition. 7. The patient has been advised to call the Pain Management Center prior to scheduled return appointment should there be significant change in the patient's condition or should the patient have other concerns regarding condition prior to scheduled return appointment.  Review of Systems     Objective:   Physical Exam        Assessment  & Plan:

## 2015-05-29 NOTE — Progress Notes (Signed)
Safety precautions to be maintained throughout the outpatient stay will include: orient to surroundings, keep bed in low position, maintain call bell within reach at all times, provide assistance with transfer out of bed and ambulation.  

## 2015-05-29 NOTE — Patient Instructions (Addendum)
Selective Nerve Root Block Patient Information  Description: Specific nerve roots exit the spinal canal and these nerves can be compressed and inflamed by a bulging disc and bone spurs.  By injecting steroids on the nerve root, we can potentially decrease the inflammation surrounding these nerves, which often leads to decreased pain.  Also, by injecting local anesthesia on the nerve root, this can provide [place] helpful information to give to your referring doctor if it decreases your pain.  Selective nerve root blocks can be done along the spine from the neck to the low back depending on the location of your pain.   After numbing the skin with local anesthesia, a small needle is passed to the nerve root and the position of the needle is verified using x-ray pictures.  After the needle is in correct position, we then deposit the medication.  You may experience a pressure sensation while this is being done.  The entire block usually lasts less than 15 minutes.  Conditions that may be treated with selective nerve root blocks:  Low back and leg pain  Spinal stenosis  Diagnostic block prior to potential surgery  Neck and arm pain  Post laminectomy syndrome  Preparation for the injection:  1. Do not eat any solid food or dairy products within 6 hours of your appointment. 2. You may drink clear liquids up to 2 hours before an appointment.  Clear liquids include water, black coffee, juice or soda.  No milk or cream please. 3. You may take your regular medications, including pain medications, with a sip of water before your appointment.  Diabetics should hold regular insulin (if taken separately) and take 1/2 normal NPH dose the morning of the procedure.  Carry some sugar containing items with you to your appointment. 4. A driver must accompany you and be prepared to drive you home after your procedure. 5. Bring all your current medications with you. 6. An IV may be inserted and sedation may be given at  the discretion of the physician. 7. A blood pressure cuff, EKG, and other monitors will often be applied during the procedure.  Some patients may need to have extra oxygen administered for a short period. 8. You will be asked to provide medical information, including allergies, prior to the procedure.  We must know immediately if you are taking blood  Thinners (like Coumadin) or if you are allergic to IV iodine contrast (dye).  Possible side-effects: All are usually temporary  Bleeding from needle site  Light headedness  Numbness and tingling  Decreased blood pressure  Weakness in arms/legs  Pressure sensation in back/neck  Pain at injection site (several days)  Possible complications: All are extremely rare  Infection  Nerve injury  Spinal headache (a headache wore with upright position)  Call if you experience:  Fever/chills associated with headache or increased back/neck pain  Headache worsened by an upright position  New onset weakness or numbness of an extremity below the injection site  Hives or difficulty breathing (go to the emergency room)  Inflammation or drainage at the injection site(s)  Severe back/neck pain greater than usual  New symptoms which are concerning to you  Please note:  Although the local anesthetic injected can often make your back or neck feel good for several hours after the injection the pain will likely return.  It takes 3-5 days for steroids to work on the nerve root. You may not notice any pain relief for at least one week.  If effective,  we will often do a series of 3 injections spaced 3-6 weeks apart to maximally decrease your pain.    If you have any questions, please call [phone] [facility] Discharge Instructions  General Discharge Instructions :  If you need to reach your doctor call: Monday-Friday 8:00 am - 4:00 pm at [phone] or toll free [phone].   After clinic hours [phone] to have operator reach doctor.  Bring all of your medication bottles to all your appointments in the pain clinic.  To cancel or reschedule your appointment with Pain Management please remember to call 24 hours in advance to avoid a fee.  Refer to the educational materials which you have been given on: General Risks, I had my Procedure. Discharge Instructions, Post Sedation.  Post Procedure Instructions:  The drugs you were given will stay in your system until tomorrow, so for the next 24 hours you should not drive, make any legal decisions or drink any alcoholic beverages.  You may eat anything you prefer, but it is better to start with liquids then soups and crackers, and gradually work up to solid foods.  Please notify your doctor immediately if you have any unusual bleeding, trouble breathing or pain that is not related to your normal pain.  Depending on the type of procedure that was done, some parts of your body may feel week and/or numb.  This usually clears up by tonight or the next day.    Continue present medications hydrocodone acetaminophen  F/U PCP Dr. Carlis Abbott  for evaliation of  BP and general medical  condition.  F/U surgical evaluation  F/U neurological evaluation  May consider radiofrequency rhizolysis or intraspinal procedures pending response to present treatment and F/U evaluation.  Patient to call Pain Management Center should patient have concerns prior to scheduled return appointment.   Walk with the use of an assistive device or accompanied by an adult for the 24 hours.  You may use ice on the affected area for the first 24 hours.  Put ice in a Ziploc bag and cover with a towel and place against area 15 minutes on 15 minutes off.  You may switch to heat after 24 hours.Pain Management Discharge Instructions  General Discharge Instructions :  If you need to reach your doctor call: Monday-Friday 8:00 am - 4:00 pm at [phone]  or toll free [phone].  After clinic hours [phone] to have operator reach doctor.  Bring all of your medication bottles to all your appointments in the pain clinic.  To cancel or reschedule your appointment with Pain Management please remember to call 24 hours in advance to avoid a fee.  Refer to the educational materials which you have been given on: General Risks, I had my Procedure. Discharge Instructions, Post Sedation.  Post Procedure Instructions:  The drugs you were given will stay in your system until tomorrow, so for the next 24 hours you should not drive, make any legal decisions or drink any alcoholic beverages.  You may eat anything you prefer, but it is better to start with liquids then soups and crackers, and gradually work up to solid foods.  Please notify your doctor immediately if you have any unusual bleeding, trouble breathing or pain that is not related to your normal pain.  Depending on the type of procedure that was done, some parts of your body may feel week and/or numb.  This usually clears up by tonight or the next day.  Walk with the use  of an assistive device or accompanied by an adult for the 24 hours.  You may use ice on the affected area for the first 24 hours.  Put ice in a Ziploc bag and cover with a towel and place against area 15 minutes on 15 minutes off.  You may switch to heat after 24 hours.

## 2015-05-30 ENCOUNTER — Telehealth: Payer: Self-pay | Admitting: *Deleted

## 2015-05-30 NOTE — Telephone Encounter (Signed)
Denies complications post procedure. 

## 2015-06-11 ENCOUNTER — Other Ambulatory Visit: Payer: Self-pay | Admitting: Family Medicine

## 2015-06-12 ENCOUNTER — Encounter
Admission: RE | Admit: 2015-06-12 | Discharge: 2015-06-12 | Disposition: A | Discharge status: Home / Self Care | Payer: PPO | Source: Ambulatory Visit | Attending: Surgery | Admitting: Surgery

## 2015-06-12 DIAGNOSIS — K648 Other hemorrhoids: Secondary | ICD-10-CM | POA: Diagnosis not present

## 2015-06-12 HISTORY — DX: Irritable bowel syndrome without diarrhea: K58.9

## 2015-06-12 NOTE — Patient Instructions (Signed)
  Your procedure is scheduled on: Friday June 16, 2015. Report to Same Day Surgery. To find out your arrival time please call [phone] between 1PM - 3PM on Thursday June 15, 2015.Marland Kitchen  Remember: Instructions that are not followed completely may result in serious medical risk, up to and including death, or upon the discretion of your surgeon and anesthesiologist your surgery may need to be rescheduled.    __x__ 1. Do not eat food or drink liquids after midnight. No gum chewing or hard candies.     __x__ 2. No Alcohol for 24 hours before or after surgery.   ____ 3. Bring all medications with you on the day of surgery if instructed.    __x__ 4. Notify your doctor if there is any change in your medical condition     (cold, fever, infections).     Do not wear jewelry, make-up, hairpins, clips or nail polish.  Do not wear lotions, powders, or perfumes. You may wear deodorant.  Do not shave 48 hours prior to surgery. Men may shave face and neck.  Do not bring valuables to the hospital.    [facility] is not responsible for any belongings or valuables.               Contacts, dentures or bridgework may not be worn into surgery.  Leave your suitcase in the car. After surgery it may be brought to your room.  For patients admitted to the hospital, discharge time is determined by your treatment team.   Patients discharged the day of surgery will not be allowed to drive home.    Please read over the following fact sheets that you were given:   [facility] Preparing for Surgery  __x__ Take these medicines the morning of surgery with A SIP OF WATER:    1. FLUoxetine (PROZAC)    ____ Fleet Enema (as directed)   ____ Use CHG Soap as directed  ____ Use inhalers on the day of surgery  ____ Stop metformin 2 days prior to surgery    ____ Take 1/2 of usual insulin dose the night before surgery and none on the morning of surgery.   ____ Stop Coumadin/Plavix/aspirin on does not  apply.  _x__ Stop Anti-inflammatories today, Tylenol OK to take for pain.   ____ Stop supplements until after surgery.    ____ Bring C-Pap to the hospital.

## 2015-06-12 NOTE — Telephone Encounter (Signed)
Electronically refill request  Last prescribed on 05/14/2015 tizanidine (ZANAFLEX) 2 MG capsule  Dispense: 80 capsule   Refills: 0   Last seen on 03/21/2015. Next apt on 06/21/2015.

## 2015-06-16 ENCOUNTER — Encounter
Admission: RE | Disposition: A | Discharge status: Home / Self Care | Payer: Self-pay | Source: Ambulatory Visit | Attending: Surgery

## 2015-06-16 ENCOUNTER — Ambulatory Visit: Payer: PPO | Admitting: *Deleted

## 2015-06-16 ENCOUNTER — Encounter: Payer: Self-pay | Admitting: Anesthesiology

## 2015-06-16 ENCOUNTER — Ambulatory Visit
Admission: RE | Admit: 2015-06-16 | Discharge: 2015-06-16 | Disposition: A | Discharge status: Home / Self Care | Payer: PPO | Source: Ambulatory Visit | Attending: Surgery | Admitting: Surgery

## 2015-06-16 DIAGNOSIS — Z823 Family history of stroke: Secondary | ICD-10-CM | POA: Insufficient documentation

## 2015-06-16 DIAGNOSIS — I341 Nonrheumatic mitral (valve) prolapse: Secondary | ICD-10-CM | POA: Diagnosis not present

## 2015-06-16 DIAGNOSIS — Z79899 Other long term (current) drug therapy: Secondary | ICD-10-CM | POA: Insufficient documentation

## 2015-06-16 DIAGNOSIS — F17200 Nicotine dependence, unspecified, uncomplicated: Secondary | ICD-10-CM | POA: Diagnosis not present

## 2015-06-16 DIAGNOSIS — G43909 Migraine, unspecified, not intractable, without status migrainosus: Secondary | ICD-10-CM | POA: Insufficient documentation

## 2015-06-16 DIAGNOSIS — Z8249 Family history of ischemic heart disease and other diseases of the circulatory system: Secondary | ICD-10-CM | POA: Insufficient documentation

## 2015-06-16 DIAGNOSIS — K589 Irritable bowel syndrome without diarrhea: Secondary | ICD-10-CM | POA: Diagnosis not present

## 2015-06-16 DIAGNOSIS — K648 Other hemorrhoids: Secondary | ICD-10-CM | POA: Diagnosis not present

## 2015-06-16 DIAGNOSIS — F329 Major depressive disorder, single episode, unspecified: Secondary | ICD-10-CM | POA: Diagnosis not present

## 2015-06-16 DIAGNOSIS — K644 Residual hemorrhoidal skin tags: Secondary | ICD-10-CM | POA: Insufficient documentation

## 2015-06-16 DIAGNOSIS — Z833 Family history of diabetes mellitus: Secondary | ICD-10-CM | POA: Insufficient documentation

## 2015-06-16 DIAGNOSIS — Z8042 Family history of malignant neoplasm of prostate: Secondary | ICD-10-CM | POA: Diagnosis not present

## 2015-06-16 DIAGNOSIS — M542 Cervicalgia: Secondary | ICD-10-CM | POA: Diagnosis not present

## 2015-06-16 DIAGNOSIS — M545 Low back pain: Secondary | ICD-10-CM | POA: Diagnosis not present

## 2015-06-16 HISTORY — PX: HEMORRHOID SURGERY: SHX153

## 2015-06-16 LAB — POCT PREGNANCY, URINE: Preg Test, Ur: NEGATIVE

## 2015-06-16 SURGERY — HEMORRHOIDECTOMY
Anesthesia: General | Wound class: Dirty or Infected

## 2015-06-16 MED ORDER — LIDOCAINE HCL (CARDIAC) 20 MG/ML IV SOLN
INTRAVENOUS | Status: DC | PRN
Start: 2015-06-16 — End: 2015-06-16
  Administered 2015-06-16: 60 mg via INTRAVENOUS

## 2015-06-16 MED ORDER — FENTANYL CITRATE (PF) 100 MCG/2ML IJ SOLN
INTRAMUSCULAR | Status: DC | PRN
  Administered 2015-06-16 (×2): 50 ug via INTRAVENOUS
  Administered 2015-06-16 (×2): 25 ug via INTRAVENOUS
  Administered 2015-06-16: 50 ug via INTRAVENOUS

## 2015-06-16 MED ORDER — STERILE WATER FOR INJECTION IJ SOLN
INTRAMUSCULAR | Status: DC
Start: 2015-06-16 — End: 2015-06-16
  Filled 2015-06-16: qty 10

## 2015-06-16 MED ORDER — HYDROMORPHONE HCL 1 MG/ML IJ SOLN: 0.2500 mg | INTRAMUSCULAR | Status: DC | PRN

## 2015-06-16 MED ORDER — PROMETHAZINE HCL 25 MG/ML IJ SOLN
6.2500 mg | INTRAMUSCULAR | Status: DC | PRN
  Administered 2015-06-16: 12.5 mg via INTRAVENOUS

## 2015-06-16 MED ORDER — LACTATED RINGERS IV SOLN
INTRAVENOUS | Status: DC
  Administered 2015-06-16: 15:00:00 via INTRAVENOUS

## 2015-06-16 MED ORDER — PHENYLEPHRINE HCL 10 MG/ML IJ SOLN
INTRAMUSCULAR | Status: DC | PRN
  Administered 2015-06-16 (×2): 100 ug via INTRAVENOUS

## 2015-06-16 MED ORDER — GLYCOPYRROLATE 0.2 MG/ML IJ SOLN
INTRAMUSCULAR | Status: DC | PRN
  Administered 2015-06-16: 0.2 mg via INTRAVENOUS

## 2015-06-16 MED ORDER — FENTANYL CITRATE (PF) 100 MCG/2ML IJ SOLN
INTRAMUSCULAR | Status: AC
  Administered 2015-06-16: 25 ug via INTRAVENOUS
  Filled 2015-06-16: qty 2

## 2015-06-16 MED ORDER — OXYCODONE-ACETAMINOPHEN 5-325 MG PO TABS: 1.0000 | ORAL_TABLET | ORAL | Status: DC | PRN

## 2015-06-16 MED ORDER — ONDANSETRON HCL 4 MG/2ML IJ SOLN
4.0000 mg | Freq: Once | INTRAMUSCULAR | Status: AC | PRN
  Administered 2015-06-16: 4 mg via INTRAVENOUS

## 2015-06-16 MED ORDER — MIDAZOLAM HCL 2 MG/2ML IJ SOLN
INTRAMUSCULAR | Status: DC | PRN
  Administered 2015-06-16: 2 mg via INTRAVENOUS

## 2015-06-16 MED ORDER — BUPIVACAINE LIPOSOME 1.3 % IJ SUSP
INTRAMUSCULAR | Status: AC
  Filled 2015-06-16: qty 20

## 2015-06-16 MED ORDER — PROMETHAZINE HCL 25 MG/ML IJ SOLN
INTRAMUSCULAR | Status: DC
Start: 2015-06-16 — End: 2015-06-16
  Filled 2015-06-16: qty 1

## 2015-06-16 MED ORDER — PROPOFOL 10 MG/ML IV BOLUS
INTRAVENOUS | Status: DC | PRN
  Administered 2015-06-16: 50 mg via INTRAVENOUS
  Administered 2015-06-16: 60 mg via INTRAVENOUS
  Administered 2015-06-16: 200 mg via INTRAVENOUS
  Administered 2015-06-16: 50 mg via INTRAVENOUS

## 2015-06-16 MED ORDER — BUPIVACAINE LIPOSOME 1.3 % IJ SUSP
INTRAMUSCULAR | Status: DC | PRN
  Administered 2015-06-16: 20 mL

## 2015-06-16 MED ORDER — FAMOTIDINE 20 MG PO TABS
20.0000 mg | ORAL_TABLET | Freq: Once | ORAL | Status: AC
  Administered 2015-06-16: 20 mg via ORAL

## 2015-06-16 MED ORDER — DEXAMETHASONE SODIUM PHOSPHATE 4 MG/ML IJ SOLN
INTRAMUSCULAR | Status: DC | PRN
  Administered 2015-06-16: 10 mg via INTRAVENOUS

## 2015-06-16 MED ORDER — FAMOTIDINE 20 MG PO TABS
ORAL_TABLET | ORAL | Status: AC
  Administered 2015-06-16: 20 mg via ORAL
  Filled 2015-06-16: qty 1

## 2015-06-16 MED ORDER — FENTANYL CITRATE (PF) 100 MCG/2ML IJ SOLN
25.0000 ug | INTRAMUSCULAR | Status: DC | PRN
  Administered 2015-06-16 (×4): 25 ug via INTRAVENOUS

## 2015-06-16 MED ORDER — OXYCODONE-ACETAMINOPHEN 5-325 MG PO TABS
1.0000 | ORAL_TABLET | ORAL | Status: DC | PRN
Start: 2015-06-16 — End: 2015-06-16

## 2015-06-16 MED ORDER — BUPIVACAINE-EPINEPHRINE (PF) 0.5% -1:200000 IJ SOLN
INTRAMUSCULAR | Status: AC
  Filled 2015-06-16: qty 30

## 2015-06-16 SURGICAL SUPPLY — 28 items
BLADE SURG 15 STRL LF DISP TIS (BLADE) ×1 IMPLANT
BLADE SURG 15 STRL SS (BLADE) ×2
CANISTER SUCT 1200ML W/VALVE (MISCELLANEOUS) ×3 IMPLANT
DRAPE LAPAROTOMY 100X77 ABD (DRAPES) ×3 IMPLANT
DRAPE LEGGINS SURG 28X43 STRL (DRAPES) ×3 IMPLANT
DRAPE UNDER BUTTOCK W/FLU (DRAPES) ×3 IMPLANT
GAUZE SPONGE 4X4 12PLY STRL (GAUZE/BANDAGES/DRESSINGS) ×3 IMPLANT
GLOVE BIO SURGEON STRL SZ7.5 (GLOVE) ×3 IMPLANT
GOWN STRL REUS W/ TWL LRG LVL3 (GOWN DISPOSABLE) ×2 IMPLANT
GOWN STRL REUS W/TWL LRG LVL3 (GOWN DISPOSABLE) ×4
HARMONIC SCALPEL FOCUS (MISCELLANEOUS) IMPLANT
JELLY LUB 2OZ STRL (MISCELLANEOUS) ×2
JELLY LUBE 2OZ STRL (MISCELLANEOUS) ×1 IMPLANT
LABEL OR SOLS (LABEL) ×3 IMPLANT
NDL SAFETY 25GX1.5 (NEEDLE) ×3 IMPLANT
NS IRRIG 500ML POUR BTL (IV SOLUTION) ×3 IMPLANT
PACK BASIN MINOR ARMC (MISCELLANEOUS) ×3 IMPLANT
PAD ABD DERMACEA PRESS 5X9 (GAUZE/BANDAGES/DRESSINGS) IMPLANT
PAD GROUND ADULT SPLIT (MISCELLANEOUS) ×3 IMPLANT
PAD PREP 24X41 OB/GYN DISP (PERSONAL CARE ITEMS) ×3 IMPLANT
SOL PREP PVP 2OZ (MISCELLANEOUS) ×3
SOLUTION PREP PVP 2OZ (MISCELLANEOUS) ×1 IMPLANT
STAPLER PROXIMATE HCS (STAPLE) IMPLANT
STRAP SAFETY BODY (MISCELLANEOUS) ×3 IMPLANT
SUT CHROMIC 2 0 SH (SUTURE) IMPLANT
SUT CHROMIC 3 0 SH 27 (SUTURE) IMPLANT
SUT PROLENE 3 0 PS 2 (SUTURE) ×3 IMPLANT
SYRINGE 10CC LL (SYRINGE) ×3 IMPLANT

## 2015-06-16 NOTE — Anesthesia Procedure Notes (Signed)
Procedure Name: LMA Insertion Date/Time: 06/16/2015 4:01 PM Performed by: Silvana Newness Pre-anesthesia Checklist: Patient identified, Emergency Drugs available, Suction available, Patient being monitored and Timeout performed Patient Re-evaluated:Patient Re-evaluated prior to inductionOxygen Delivery Method: Circle system utilized Preoxygenation: Pre-oxygenation with 100% oxygen Intubation Type: IV induction Ventilation: Mask ventilation without difficulty LMA: LMA inserted LMA Size: 3.5 Number of attempts: 1 Placement Confirmation: positive ETCO2 and breath sounds checked- equal and bilateral Tube secured with: Tape Dental Injury: Teeth and Oropharynx as per pre-operative assessment

## 2015-06-16 NOTE — OR Nursing (Signed)
Pt voided 200 ml on bedpan.  Rectal dressing saturated, dressing removed and new dressing applied.

## 2015-06-16 NOTE — OR Nursing (Signed)
This RN came to PACU to recover pt.  Pt sleeping on her left she.  VS stable.

## 2015-06-16 NOTE — Transfer of Care (Signed)
Immediate Anesthesia Transfer of Care Note  Patient: Karen Edwards  Procedure(s) Performed: Procedure(s): HEMORRHOIDECTOMY (N/A)  Patient Location: PACU  Anesthesia Type:General  Level of Consciousness: sedated  Airway & Oxygen Therapy: Patient Spontanous Breathing and Patient connected to face mask oxygen  Post-op Assessment: Report given to RN  Post vital signs: Reviewed and stable  Last Vitals:  Filed Vitals:   06/16/15 1737  BP: 115/66  Pulse: 70  Temp: 36.8 C  Resp: 19    Complications: No apparent anesthesia complications

## 2015-06-16 NOTE — Anesthesia Preprocedure Evaluation (Signed)
Anesthesia Evaluation  Patient identified by MRN, date of birth, ID band Patient awake    Reviewed: Allergy & Precautions, H&P , NPO status , Patient's Chart, lab work & pertinent test results, reviewed documented beta blocker date and time   History of Anesthesia Complications Negative for: history of anesthetic complications  Airway Mallampati: II  TM Distance: >3 FB Neck ROM: full    Dental no notable dental hx. (+) Teeth Intact   Pulmonary neg shortness of breath, neg sleep apnea, neg COPDneg recent URI, Current Smoker,  breath sounds clear to auscultation  Pulmonary exam normal       Cardiovascular Exercise Tolerance: Good - angina- CAD, - Past MI and - CABG Normal cardiovascular exam- dysrhythmias + Valvular Problems/Murmurs MVP Rhythm:regular Rate:Normal     Neuro/Psych PSYCHIATRIC DISORDERS (depression)  Neuromuscular disease (lumbar radiculopathy)    GI/Hepatic Neg liver ROS, GERD- (no current symptoms)  ,  Endo/Other  negative endocrine ROS  Renal/GU negative Renal ROS  negative genitourinary   Musculoskeletal   Abdominal   Peds  Hematology negative hematology ROS (+)   Anesthesia Other Findings Past Medical History:   MVP (mitral valve prolapse)                                  Depression                                                   Chronic back pain                                            Hypotension                                                  IBS (irritable bowel syndrome)                  05/2015       Reproductive/Obstetrics negative OB ROS                             Anesthesia Physical Anesthesia Plan  ASA: II  Anesthesia Plan: General   Post-op Pain Management:    Induction:   Airway Management Planned:   Additional Equipment:   Intra-op Plan:   Post-operative Plan:   Informed Consent: I have reviewed the patients History and Physical, chart,  labs and discussed the procedure including the risks, benefits and alternatives for the proposed anesthesia with the patient or authorized representative who has indicated his/her understanding and acceptance.   Dental Advisory Given  Plan Discussed with: Anesthesiologist, CRNA and Surgeon  Anesthesia Plan Comments:         Anesthesia Quick Evaluation

## 2015-06-16 NOTE — Anesthesia Postprocedure Evaluation (Signed)
  Anesthesia Post-op Note  Patient: Karen Edwards  Procedure(s) Performed: Procedure(s): HEMORRHOIDECTOMY (N/A)  Anesthesia type:General  Patient location: PACU  Post pain: Pain level controlled  Post assessment: Post-op Vital signs reviewed, Patient's Cardiovascular Status Stable, Respiratory Function Stable, Patent Airway and No signs of Nausea or vomiting  Post vital signs: Reviewed and stable  Last Vitals:  Filed Vitals:   06/16/15 1915  BP: 106/52  Pulse: 60  Temp:   Resp: 16    Level of consciousness: awake, alert  and patient cooperative  Complications: No apparent anesthesia complications

## 2015-06-16 NOTE — OR Nursing (Signed)
Dr. Smith into see pt and husband. 

## 2015-06-16 NOTE — Discharge Instructions (Signed)
AMBULATORY SURGERY  DISCHARGE INSTRUCTIONS   1) The drugs that you were given will stay in your system until tomorrow so for the next 24 hours you should not:  A) Drive an automobile B) Make any legal decisions C) Drink any alcoholic beverage   2) You may resume regular meals tomorrow.  Today it is better to start with liquids and gradually work up to solid foods.  You may eat anything you prefer, but it is better to start with liquids, then soup and crackers, and gradually work up to solid foods.   3) Please notify your doctor immediately if you have any unusual bleeding, trouble breathing, redness and pain at the surgery site, drainage, fever, or pain not relieved by medication.    4) Additional Instructions:   Take acetaminophen or acetaminophen with oxycodone as needed for pain.  Should not take oxycodone and hydrocodone at the same time.  Remove dressing tomorrow. May then shower and/or sit in warm water.  Tuck gauze or pad in underwear over the next week as needed for drainage.  Take MiraLAX one cap full 3 times per day in water or beverage.  Resume other usual medicines same as before surgery.     Please contact your physician with any problems or Same Day Surgery at [phone], Monday through Friday 6 am to 4 pm, or [place] at [facility] number at [phone].

## 2015-06-16 NOTE — H&P (Signed)
  She reports she does continue to have some nausea. She has been eating satisfactorily She also has some obstipation. She has been taking MiraLAX 2 times per day. She cannot afford Linzess and therefore not taking it.  She reports no other change in overall condition since the day of the office visit.  Plan is to do hemorrhoidectomy as discussed

## 2015-06-16 NOTE — Op Note (Signed)
OPERATIVE REPORT  PREOPERATIVE  DIAGNOSIS: . Internal and external hemorrhoids  POSTOPERATIVE DIAGNOSIS: . Internal and external hemorrhoids  PROCEDURE: . Internal and external hemorrhoidectomy  ANESTHESIA:  General  SURGEON: Rochel Brome  MD   INDICATIONS: . She reports a history of hemorrhoidal pain and swelling and bleeding. She did have physical findings of large internal and external hemorrhoids and hemorrhoidectomy was recommended for definitive treatment  The patient was placed on the operating table in the supine position under general anesthesia. The legs were elevated into the lithotomy position using ankle straps. The anal area was prepared with Betadine solution and draped with sterile towels and sheets. Initial inspection revealed multiple external hemorrhoids at the 2:00 4:00 and 7:00 and 10:00 positions. The anoderm was infiltrated with Exparel. Deeper tissues surrounding the sphincter were infiltrated as well. The anal canal was dilated large enough to admit 3 fingers. The bivalve anal retractor was introduced. The internal and external hemorrhoid at 2:00 position was removed by placing a 3-0 chromic suture ligature at the upper extent of the internal hemorrhoid. A V-shaped incision was made externally. Electrocautery was used for hemostasis as the external hemorrhoid was dissected free from surrounding tissues and the dissection was carried up over the internal anal sphincter to the previously placed suture ligature. The hemorrhoid was ligated with the same 3-0 chromic suture and excised. Several small bleeding points were cauterized. The wound was closed with a running locked tied 3-0 chromic suture leaving a small opening externally for drainage.  Similar procedures carried out at the 4:00 position.  A similar procedure was carried out at the 7:00 position.  A similar procedure was carried out at the 10:00 position.  The operative site was further cleaned with Betadine and  infiltrated additional Exparel. Dressings were applied with paper tape.  The patient tolerated the procedure satisfactorily and was then prepared for transfer to the recovery room.   Rochel Brome M.D.

## 2015-06-19 ENCOUNTER — Telehealth: Payer: Self-pay | Admitting: Pain Medicine

## 2015-06-19 ENCOUNTER — Encounter: Payer: Self-pay | Admitting: Surgery

## 2015-06-19 NOTE — Telephone Encounter (Signed)
Nurses Please make record of patient receiving medication from Dr. Tamala Julian for surgery This appears to be okay and patient appears to be compliant with medication agreement

## 2015-06-19 NOTE — Telephone Encounter (Signed)
Pt filled a script for percocet 5/325 #  24 , Dr Rochel Brome prescribed this for her due to some kind of surg

## 2015-06-20 ENCOUNTER — Ambulatory Visit: Payer: PPO | Attending: Pain Medicine | Admitting: Pain Medicine

## 2015-06-20 ENCOUNTER — Encounter: Payer: Self-pay | Admitting: Pain Medicine

## 2015-06-20 VITALS — BP 108/74 | HR 107 | Temp 97.5°F | Resp 20 | Ht 60.0 in | Wt 135.0 lb

## 2015-06-20 DIAGNOSIS — M5137 Other intervertebral disc degeneration, lumbosacral region: Secondary | ICD-10-CM

## 2015-06-20 DIAGNOSIS — M5481 Occipital neuralgia: Secondary | ICD-10-CM | POA: Insufficient documentation

## 2015-06-20 DIAGNOSIS — M79605 Pain in left leg: Secondary | ICD-10-CM | POA: Diagnosis present

## 2015-06-20 DIAGNOSIS — M533 Sacrococcygeal disorders, not elsewhere classified: Secondary | ICD-10-CM | POA: Diagnosis not present

## 2015-06-20 DIAGNOSIS — M47896 Other spondylosis, lumbar region: Secondary | ICD-10-CM | POA: Diagnosis not present

## 2015-06-20 DIAGNOSIS — G90521 Complex regional pain syndrome I of right lower limb: Secondary | ICD-10-CM

## 2015-06-20 DIAGNOSIS — M5136 Other intervertebral disc degeneration, lumbar region: Secondary | ICD-10-CM | POA: Diagnosis not present

## 2015-06-20 DIAGNOSIS — M79604 Pain in right leg: Secondary | ICD-10-CM | POA: Diagnosis present

## 2015-06-20 DIAGNOSIS — M5030 Other cervical disc degeneration, unspecified cervical region: Secondary | ICD-10-CM

## 2015-06-20 DIAGNOSIS — M5416 Radiculopathy, lumbar region: Secondary | ICD-10-CM

## 2015-06-20 DIAGNOSIS — M545 Low back pain: Secondary | ICD-10-CM | POA: Diagnosis present

## 2015-06-20 LAB — SURGICAL PATHOLOGY

## 2015-06-20 NOTE — Progress Notes (Signed)
   Subjective:    Patient ID: Karen Edwards, female    DOB: 1966/10/11, 49 y.o.   MRN: [id number]  HPI  Patient is 49 year old female returns to [facility] for follow-up evaluation and treatment of pain involving the lower back and lower extremity region predominantly with pain occurring the back of the neck radiating to the back of the hip precipitating headaches. Patient is status post hemorrhoid surgery by Dr. Rochel Brome. We will avoid interventional treatment at the present time and will continue medications as prescribed by Dr. Rochel Brome at this time. The patient will call Pain Management Center should there be change in condition prior to scheduled return appointment.the patient was understanding and agree with suggested treatment plan  Review of Systems     Objective:   Physical Exam  There was tenderness of the splenius capitis and occipitalis musculature region palpation with reproduced mild discomfort. There was mild tennis over the region of the acromial clavicular and glenohumeral joint region. Palpation of the trapezius muscle and regionregion was associated with moderate discomfort.There was tenderness to palpation over the region of the levator scapula and rhomboid musculature regions of mild to moderate degree as well. The patient appeared to be with unremarkable Spurling's maneuver and was with bilaterally equal grip strength. Tinel and Phalen's maneuver were without increased pain of significant degree. There was no crepitus of the thoracic region noted. Palpation over the lumbar paraspinal muscle lumbar facet region was a tends to palpation with tenderness to palpation of the gluteal and piriformis muscles regions as well. Palpation of the PSIS and PII S region reproduced moderate discomfort. There was mild tinnitus of the greater trochanteric region iliotibial band region. Straight leg raising was tolerates approximately 20 without a definite increased pain with  dorsiflexion noted. There appeared to be decreased EHL strength on the right compared to the left. There was question decreased sensation of the L5 dermatomal distribution. There was negative clonus negative Homans. Abdomen was nontender and no costovertebral angle tenderness was noted.      Assessment & Plan:     Degenerative disc disease lumbar spine Degenerative changes lumbar spine L1-2 level degenerative disc disease with right central disc extrusion cranial migration of disc material  Lumbar facet syndrome  Lumbar radiculopathy  Sacroiliac joint dysfunction  Bilateral occipital neuralgia  Degenerative disc disease cervical spine    Plan   Continue present medication oxycodone and acetaminophen as prescribed by Dr. Rochel Brome postoperatively. Patient is status post hemorrhoid surgery. We will avoid prescribing medications at this time. We will resume prescribing medications for patient with Dr. Tamala Julian wishes for [place] to do such  Lumbosacral selective nerve root block to be considered at time return appointment  F/U PCP Dr. Carlis Abbott for evaliation of  BP and general medical  condition  F/U surgical evaluation with Dr. Rochel Brome as planned  F/U neurological evaluation  May consider radiofrequency rhizolysis or intraspinal procedures pending response to present treatment and F/U evaluation   Patient to call Pain Management Center should patient have concerns prior to scheduled return appointmen.

## 2015-06-20 NOTE — Progress Notes (Signed)
Safety precautions to be maintained throughout the outpatient stay will include: orient to surroundings, keep bed in low position, maintain call bell within reach at all times, provide assistance with transfer out of bed and ambulation.  

## 2015-06-20 NOTE — Patient Instructions (Signed)
Continue present medication oxycodone acetaminophen as per Dr. Nicholes Stairs  Lumbosacral selective nerve root block to be performed at time return appointment is discussed  F/U PCP Dr. Carlis Abbott for evaliation of  BP and general medical  condition  F/U surgical evaluation  F/U neurological evaluation  May consider radiofrequency rhizolysis or intraspinal procedures pending response to present treatment and F/U evaluation   Patient to call Pain Management Center should patient have concerns prior to scheduled return appointmen.

## 2015-06-21 ENCOUNTER — Encounter: Payer: Self-pay | Admitting: Primary Care

## 2015-06-21 ENCOUNTER — Ambulatory Visit (INDEPENDENT_AMBULATORY_CARE_PROVIDER_SITE_OTHER): Payer: PPO | Admitting: Primary Care

## 2015-06-21 VITALS — BP 124/72 | HR 96 | Temp 98.4°F | Ht 60.0 in | Wt 136.8 lb

## 2015-06-21 DIAGNOSIS — M545 Low back pain: Secondary | ICD-10-CM

## 2015-06-21 DIAGNOSIS — K625 Hemorrhage of anus and rectum: Secondary | ICD-10-CM | POA: Diagnosis not present

## 2015-06-21 DIAGNOSIS — F329 Major depressive disorder, single episode, unspecified: Secondary | ICD-10-CM

## 2015-06-21 DIAGNOSIS — M47816 Spondylosis without myelopathy or radiculopathy, lumbar region: Secondary | ICD-10-CM

## 2015-06-21 DIAGNOSIS — F32A Depression, unspecified: Secondary | ICD-10-CM

## 2015-06-21 MED ORDER — TIZANIDINE HCL 2 MG PO CAPS: ORAL_CAPSULE | ORAL | Status: DC

## 2015-06-21 MED ORDER — FLUOXETINE HCL 20 MG PO TABS: 20.0000 mg | ORAL_TABLET | Freq: Three times a day (TID) | ORAL | Status: DC

## 2015-06-21 MED ORDER — TRAZODONE HCL 100 MG PO TABS: 200.0000 mg | ORAL_TABLET | Freq: Every day | ORAL | Status: DC

## 2015-06-21 NOTE — Assessment & Plan Note (Signed)
Managed by Dr. Primus Bravo with tizanidine and vicodin. Refill provided today for tizanidine, but notified patient that he is to manage both medications. Overall felling well managed. Next appointment with pain management is September 26th.

## 2015-06-21 NOTE — Assessment & Plan Note (Signed)
Feels well managed on Fluoxitine 20 mg TID, Trazodone 200 mg HS. Denies SI/HI Will continue to monitor. Follow up in 6 months.

## 2015-06-21 NOTE — Progress Notes (Signed)
Subjective:    Patient ID: Karen Edwards, female    DOB: 03/25/1966, 49 y.o.   MRN: [id number]  HPI  Karen Edwards is a 49 year old female who presents today for follow up.  1) Depression: Managed on Fluoxitine 20mg  TID and Trazodone 200 mg at bedtime. Once followed with [facility] in [place] but requested last visit to follow with our clinic. She feels well managed on these doses. Denies SI/HI. Say psychiatrist in [place] [employer] 1 week ago for disability review who had no recommendations for changes in medication. Denies SI/HI.  2) Chronic back pain: Managed by Dr. Primus Bravo with pain management and is taking tizanidine and Vicodin. She needs a refill of tizanidine.   3) Rectal bleeding: History of for years, colonoscopy completed on June 7th which was normal per patient. She underwent hemorrhoidectomy on Friday August 19th. They removed 2 hemorrhoids internally and 2 externally. She has follow up scheduled for September 1st.   Review of Systems  Respiratory: Negative for shortness of breath.   Cardiovascular: Negative for chest pain.  Gastrointestinal: Positive for rectal pain.  Musculoskeletal:       Chronic back pain.  Neurological:       Occasional dizziness, walks with cane       Past Medical History  Diagnosis Date  . MVP (mitral valve prolapse)   . Depression   . Chronic back pain   . Hypotension   . IBS (irritable bowel syndrome) 05/2015    Social History   Social History  . Marital Status: Married    Spouse Name: N/A  . Number of Children: N/A  . Years of Education: N/A   Occupational History  . Not on file.   Social History Main Topics  . Smoking status: Current Every Day Smoker -- 0.50 packs/day    Types: Cigarettes  . Smokeless tobacco: Not on file  . Alcohol Use: 0.6 oz/week    1 Cans of beer, 0 Standard drinks or equivalent per week     Comment: drinks an occasional beer  . Drug Use: No  . Sexual Activity: Yes   Other Topics Concern  . Not on file    Social History Narrative   Married.   2 children. One boy and one girl.   On disability.   Playing with her dogs, gardening.       Past Surgical History  Procedure Laterality Date  . Hysteroscopy    . Dilation and curettage of uterus    . Cesarean section      x 2  . Other surgical history Right 2015    ankle  . Ankle surgery Right 2015  . Hemorrhoid surgery N/A 06/16/2015    Procedure: HEMORRHOIDECTOMY;  Surgeon: Leonie Green, MD;  Location: [facility] ORS;  Service: General;  Laterality: N/A;    Family History  Problem Relation Age of Onset  . Arthritis Mother   . Depression Mother   . Hyperlipidemia Mother   . Hypertension Mother   . Kidney disease Mother   . Mental illness Mother   . Vision loss Mother   . Varicose Veins Mother   . Hearing loss Father   . Hyperlipidemia Father   . Hypertension Father     Allergies  Allergen Reactions  . No Known Allergies     Current Outpatient Prescriptions on File Prior to Visit  Medication Sig Dispense Refill  . oxyCODONE-acetaminophen (ROXICET) 5-325 MG per tablet Take 1-2 tablets by mouth every 4 (four) hours as  needed for moderate pain or severe pain. 24 tablet 0  . vitamin B-12 (CYANOCOBALAMIN) 50 MCG tablet Take 50 mcg by mouth every morning.     Marland Kitchen HYDROcodone-acetaminophen (NORCO/VICODIN) 5-325 MG per tablet Limit 1/2 - 1 tab po / day or bid if tolerated (Patient not taking: Reported on 06/21/2015) 50 tablet 0   No current facility-administered medications on file prior to visit.    BP 124/72 mmHg  Pulse 96  Temp(Src) 98.4 F (36.9 C) (Oral)  Ht 5' (1.524 m)  Wt 136 lb 12.8 oz (62.052 kg)  BMI 26.72 kg/m2  SpO2 98%  LMP 11/11/2014    Objective:   Physical Exam  Constitutional: She is oriented to person, place, and time. She appears well-nourished.  Cardiovascular: Normal rate and regular rhythm.   Pulmonary/Chest: Effort normal and breath sounds normal.  Neurological: She is alert and oriented to  person, place, and time.  Skin: Skin is warm and dry.  Psychiatric: She has a normal mood and affect.          Assessment & Plan:

## 2015-06-21 NOTE — Progress Notes (Signed)
Pre visit review using our clinic review tool, if applicable. No additional management support is needed unless otherwise documented below in the visit note. 

## 2015-06-21 NOTE — Assessment & Plan Note (Signed)
Underwent hemorrhoidectomy August 19th, follow up scheduled on September 1st.

## 2015-06-21 NOTE — Patient Instructions (Signed)
Refills have been provided for your medications. Please have Dr. Primus Bravo take over your Tizanidine refills in the future.  Follow up in 6 months for re-evaluation of depression.  It was a pleasure to see you today!

## 2015-06-22 MED ORDER — FLUOXETINE HCL 20 MG PO CAPS: 20.0000 mg | ORAL_CAPSULE | Freq: Three times a day (TID) | ORAL | Status: DC

## 2015-06-22 NOTE — Addendum Note (Signed)
Addended by: Jacqualin Combes on: 06/22/2015 11:42 AM   Modules accepted: Orders, Medications

## 2015-06-29 ENCOUNTER — Other Ambulatory Visit: Payer: Self-pay | Admitting: Pain Medicine

## 2015-07-06 ENCOUNTER — Telehealth: Payer: Self-pay | Admitting: Pain Medicine

## 2015-07-06 NOTE — Telephone Encounter (Signed)
Attempted to call pt,message left. 

## 2015-07-06 NOTE — Telephone Encounter (Signed)
Please call regarding meds and a refill

## 2015-07-06 NOTE — Telephone Encounter (Signed)
Had surgery, was given Oxycodone by Dr. Tamala Julian. Was told by Dr. Primus Bravo to stop Hydrocodone while she was on Oxycodone, she did that. Has run out of both . Pt said Dr. Primus Bravo told her to call when she ran out and he would give her another prescription.  According to our records, Hydrocodone (from Dr. Primus Bravo), was to last until 07-20-15.

## 2015-07-06 NOTE — Telephone Encounter (Signed)
Nurses  Caryl Pina and Juliann Pulse  Please have patient call Dr. Tamala Julian to inform him that she has depleted her opioid medication  We will need approval from Dr. Tamala Julian to prescribe medications for treatment of patient's pain since the patient is post operative patient and surgeon must remain aware of patient's medication requirement for treatment of any pain at this time since the pain require emergent surgical intervention Patient will need for Dr. Tamala Julian or Dr. Thompson Caul nurse call pain management to request that I resume prescribing medications for treatment of patient's pain

## 2015-07-07 NOTE — Telephone Encounter (Signed)
Patient informed to call Dr Tamala Julian to get approval for [place] to prescribe pain medication since she is out and has recently had surgery.  Patient states understanding.

## 2015-07-17 ENCOUNTER — Telehealth: Payer: Self-pay | Admitting: Pain Medicine

## 2015-07-17 NOTE — Telephone Encounter (Signed)
Would like to talk to Dr Primus Bravo about getting meds. Pt states that Dr Primus Bravo told her to call once she was finished with the percocet that was given for surgery and that Dr Primus Bravo would write her a script for what she normally takes since she has already had med/eval apt.

## 2015-07-17 NOTE — Telephone Encounter (Signed)
   Nurses,   Please call patient after discussing with me. We will need permission from patient's surgeon to resume prescribing medications for treatment of patient's pain since patient is post operative. I need permission from the surgeon to resume prescribing medications to avoid masking any pain that patient should report to her surgeon that could require additional surgery or other treatment

## 2015-07-18 NOTE — Telephone Encounter (Signed)
Attempted to call patient to ask who her surgeon is.  Message left.

## 2015-07-18 NOTE — Telephone Encounter (Signed)
Spoke with Dr. Thompson Caul office, patient as finished post-op care with Dr. Tamala Julian, he no longer sees her. Dr. Primus Bravo now has permission to prescribe pain medications.

## 2015-07-18 NOTE — Telephone Encounter (Signed)
Nurses, Caryl Pina and Juliann Pulse, Please schedule evaluation for next week

## 2015-07-19 NOTE — Telephone Encounter (Signed)
Pt is already sch to come in on Monday 9/26 for a procedure. Do you want to change this and not do a procedure and make it an eval apt or what? Thanks [employer]

## 2015-07-19 NOTE — Telephone Encounter (Signed)
Nurses, Caryl Pina and Juliann Pulse,  Please see prior message regarding scheduling patient for evaluation  Please discussed with me today

## 2015-07-24 ENCOUNTER — Ambulatory Visit: Payer: PPO | Attending: Pain Medicine | Admitting: Pain Medicine

## 2015-07-24 ENCOUNTER — Encounter: Payer: Self-pay | Admitting: Pain Medicine

## 2015-07-24 VITALS — BP 119/73 | HR 57 | Temp 98.9°F | Resp 16 | Ht 60.0 in | Wt 135.0 lb

## 2015-07-24 DIAGNOSIS — M5481 Occipital neuralgia: Secondary | ICD-10-CM

## 2015-07-24 DIAGNOSIS — G90521 Complex regional pain syndrome I of right lower limb: Secondary | ICD-10-CM

## 2015-07-24 DIAGNOSIS — M533 Sacrococcygeal disorders, not elsewhere classified: Secondary | ICD-10-CM

## 2015-07-24 DIAGNOSIS — M79605 Pain in left leg: Secondary | ICD-10-CM | POA: Diagnosis present

## 2015-07-24 DIAGNOSIS — M545 Low back pain: Secondary | ICD-10-CM | POA: Diagnosis present

## 2015-07-24 DIAGNOSIS — M79604 Pain in right leg: Secondary | ICD-10-CM | POA: Diagnosis present

## 2015-07-24 DIAGNOSIS — G8929 Other chronic pain: Secondary | ICD-10-CM

## 2015-07-24 DIAGNOSIS — G894 Chronic pain syndrome: Secondary | ICD-10-CM

## 2015-07-24 DIAGNOSIS — M5136 Other intervertebral disc degeneration, lumbar region: Secondary | ICD-10-CM | POA: Diagnosis not present

## 2015-07-24 DIAGNOSIS — M5030 Other cervical disc degeneration, unspecified cervical region: Secondary | ICD-10-CM

## 2015-07-24 DIAGNOSIS — G90523 Complex regional pain syndrome I of lower limb, bilateral: Secondary | ICD-10-CM

## 2015-07-24 DIAGNOSIS — M47816 Spondylosis without myelopathy or radiculopathy, lumbar region: Secondary | ICD-10-CM | POA: Insufficient documentation

## 2015-07-24 DIAGNOSIS — M5416 Radiculopathy, lumbar region: Secondary | ICD-10-CM

## 2015-07-24 DIAGNOSIS — M5126 Other intervertebral disc displacement, lumbar region: Secondary | ICD-10-CM | POA: Insufficient documentation

## 2015-07-24 DIAGNOSIS — M5137 Other intervertebral disc degeneration, lumbosacral region: Secondary | ICD-10-CM

## 2015-07-24 MED ORDER — HYDROCODONE-ACETAMINOPHEN 5-325 MG PO TABS: ORAL_TABLET | ORAL | Status: DC

## 2015-07-24 MED ORDER — ORPHENADRINE CITRATE 30 MG/ML IJ SOLN
INTRAMUSCULAR | Status: AC
  Administered 2015-07-24: 12:00:00
  Filled 2015-07-24: qty 2

## 2015-07-24 MED ORDER — TRIAMCINOLONE ACETONIDE 40 MG/ML IJ SUSP
INTRAMUSCULAR | Status: AC
  Administered 2015-07-24: 12:00:00
  Filled 2015-07-24: qty 1

## 2015-07-24 MED ORDER — BUPIVACAINE HCL (PF) 0.25 % IJ SOLN
INTRAMUSCULAR | Status: AC
  Administered 2015-07-24: 12:00:00
  Filled 2015-07-24: qty 30

## 2015-07-24 MED ORDER — MIDAZOLAM HCL 5 MG/5ML IJ SOLN
INTRAMUSCULAR | Status: AC
  Administered 2015-07-24: 5 mg via INTRAVENOUS
  Filled 2015-07-24: qty 5

## 2015-07-24 MED ORDER — FENTANYL CITRATE (PF) 100 MCG/2ML IJ SOLN
INTRAMUSCULAR | Status: AC
  Administered 2015-07-24: 100 ug via INTRAVENOUS
  Filled 2015-07-24: qty 2

## 2015-07-24 NOTE — Progress Notes (Signed)
Subjective:    Patient ID: Karen Edwards, female    DOB: 14-Aug-1966, 49 y.o.   MRN: [id number]  HPI  PROCEDURE PERFORMED: Lumbosacral selective nerve root block   NOTE: The patient is a 49 y.o. female who returns to [place] for further evaluation and treatment of pain involving the lumbar and lower extremity region. Studies consisting of MRI has revealed the patient to be with evidence of degenerative disc disease lumbar spine with degenerative changes lumbar spine L1-2 level degenerative disc disease with right central disc extrusion cranial migration of disc material. There is concern regarding intraspinal abnormalities contributing to the patient's symptomatology. There is concern regarding significant component of pain due to radiculopathy. The risks, benefits, and expectations of the procedure have been explained to the patient who was understanding and in agreement with suggested treatment plan. We will proceed with interventional treatment as discussed and as explained to the patient. The patient is understanding and in agreement with suggested treatment plan.   DESCRIPTION OF PROCEDURE: Lumbosacral selective nerve root block with IV Versed, IV fentanyl conscious sedation, EKG, blood pressure, pulse, and pulse oximetry monitoring. The procedure was performed with the patient in the prone position under fluoroscopic guidance. With the patient in the prone position, Betadine prep of proposed entry site was performed. Local anesthetic skin wheal of proposed needle entry site was prepared with 1.5% plain lidocaine with AP view of the lumbosacral spine.   PROCEDURE #1: Needle placement at the left L 2  vertebral body: A 22 -gauge needle was inserted at the inferior border of the transverse process of the vertebral body with needle placed medial to the midline of the transverse process on AP view of the lumbosacral spine.   NEEDLE PLACEMENT AT  L3, L4, and L5  VERTEBRAL BODY  LEVELS  Needle  placement was accomplished at L3, L4, and L5   vertebral body levels on the left side exactly as was accomplished at the L3, L4, and L5  vertebral body level  and utilizing the same technique and under fluoroscopic guidance.   Needle placement was then verified on lateral view at all levels with needle tip documented to be in the posterior superior quadrant of the intervertebral foramen of  L 2, L3, L4, and L5. Following negative aspiration for heme and CSF at each level, each level was injected with 3 mL of 0.25% bupivacaine with Kenalog.   LUMBOSACRAL SELECTIVE NERVE ROOT BLOCKS THE THE  RIGHT SIDE  The procedure was performed on the right side exactly as was performed on the left side and at the same levels  Under fluoroscopic guidance and utilizing the same technique.  I'll nerve block injections of the cervical region Following Betadine prep of proposed entry site a 22-gauge needle was inserted in the paraspinal musculature region of the cervical region and following negative aspiration 2 cc of 0.25% bupivacaine with Norflex was injected for myoneural block injection of the cervical region 4    The patient tolerated the procedure well. A total of 10 mg of Kenalog was utilized for the procedure.   PLAN:  1. Medications: Will continue presently prescribed medication hydrocodone acetaminophen 2. The patient is to undergo follow-up evaluation with PCP Dr. Carlis Abbott for evaluation of blood pressure and general medical condition status post procedure performed on today's visit. 3. Surgical follow-up evaluation. Patient will undergo follow-up surgical evaluation as discussed 4. Neurological evaluation. May consider PNCV EMG studies and other studies procedures, implantation type procedures and other  treatment pending response to treatment and follow-up evaluation. 5. The patient has been advise do adhere to proper body mechanics and avoid activities which may aggravate  condition. 6. The patient has been advised to call the Pain Management Center prior to scheduled return appointment should there be significant change in the patient's condition or should the patient have other concerns regarding condition prior to scheduled return appointment.      Review of Systems     Objective:   Physical Exam        Assessment & Plan:

## 2015-07-24 NOTE — Patient Instructions (Addendum)
PLAN   Continue present medication hydrocodone acetaminophen  F/U PCP Dr. Carlis Abbott for evaliation of  BP and general medical  condition  F/U surgical evaluation as discussed  F/U neurological evaluation. May consider pending follow-up evaluations  May consider radiofrequency rhizolysis or intraspinal procedures pending response to present treatment and F/U evaluation   Patient to call Pain Management Center should patient have concerns prior to scheduled return appointment. Selective Nerve Root Block Patient Information  Description: Specific nerve roots exit the spinal canal and these nerves can be compressed and inflamed by a bulging disc and bone spurs.  By injecting steroids on the nerve root, we can potentially decrease the inflammation surrounding these nerves, which often leads to decreased pain.  Also, by injecting local anesthesia on the nerve root, this can provide [place] helpful information to give to your referring doctor if it decreases your pain.  Selective nerve root blocks can be done along the spine from the neck to the low back depending on the location of your pain.   After numbing the skin with local anesthesia, a small needle is passed to the nerve root and the position of the needle is verified using x-ray pictures.  After the needle is in correct position, we then deposit the medication.  You may experience a pressure sensation while this is being done.  The entire block usually lasts less than 15 minutes.  Conditions that may be treated with selective nerve root blocks:  Low back and leg pain  Spinal stenosis  Diagnostic block prior to potential surgery  Neck and arm pain  Post laminectomy syndrome  Preparation for the injection:  1. Do not eat any solid food or dairy products within 6 hours of your appointment. 2. You may drink clear liquids up to 2 hours before an appointment.  Clear liquids include water, black coffee, juice or soda.  No milk or cream  please. 3. You may take your regular medications, including pain medications, with a sip of water before your appointment.  Diabetics should hold regular insulin (if taken separately) and take 1/2 normal NPH dose the morning of the procedure.  Carry some sugar containing items with you to your appointment. 4. A driver must accompany you and be prepared to drive you home after your procedure. 5. Bring all your current medications with you. 6. An IV may be inserted and sedation may be given at the discretion of the physician. 7. A blood pressure cuff, EKG, and other monitors will often be applied during the procedure.  Some patients may need to have extra oxygen administered for a short period. 8. You will be asked to provide medical information, including allergies, prior to the procedure.  We must know immediately if you are taking blood  Thinners (like Coumadin) or if you are allergic to IV iodine contrast (dye).  Possible side-effects: All are usually temporary  Bleeding from needle site  Light headedness  Numbness and tingling  Decreased blood pressure  Weakness in arms/legs  Pressure sensation in back/neck  Pain at injection site (several days)  Possible complications: All are extremely rare  Infection  Nerve injury  Spinal headache (a headache wore with upright position)  Call if you experience:  Fever/chills associated with headache or increased back/neck pain  Headache worsened by an upright position  New onset weakness or numbness of an extremity below the injection site  Hives or difficulty breathing (go to the emergency room)  Inflammation or drainage at the injection site(s)  Severe back/neck pain greater than usual  New symptoms which are concerning to you  Please note:  Although the local anesthetic injected can often make your back or neck feel good for several hours after the injection the pain will likely return.  It takes 3-5 days for steroids  to work on the nerve root. You may not notice any pain relief for at least one week.  If effective, we will often do a series of 3 injections spaced 3-6 weeks apart to maximally decrease your pain.    If you have any questions, please call [phone] [facility] Inst[facility]  General Discharge Instructions :  If you need to reach your doctor call: Monday-Friday 8:00 am - 4:00 pm at [phone] or toll free [phone].  After clinic hours [phone] to have operator reach doctor.  Bring all of your medication bottles to all your appointments in the pain clinic.  To cancel or reschedule your appointment with Pain Management please remember to call 24 hours in advance to avoid a fee.  Refer to the educational materials which you have been given on: General Risks, I had my Procedure. Discharge Instructions, Post Sedation.  Post Procedure Instructions:  The drugs you were given will stay in your system until tomorrow, so for the next 24 hours you should not drive, make any legal decisions or drink any alcoholic beverages.  You may eat anything you prefer, but it is better to start with liquids then soups and crackers, and gradually work up to solid foods.  Please notify your doctor immediately if you have any unusual bleeding, trouble breathing or pain that is not related to your normal pain.  Depending on the type of procedure that was done, some parts of your body may feel week and/or numb.  This usually clears up by tonight or the next day.  Walk with the use of an assistive device or accompanied by an adult for the 24 hours.  You may use ice on the affected area for the first 24 hours.  Put ice in a Ziploc bag and cover with a towel and place against area 15 minutes on 15 minutes off.  You may switch to heat after 24 hours.

## 2015-07-24 NOTE — Progress Notes (Signed)
Safety precautions to be maintained throughout the outpatient stay will include: orient to surroundings, keep bed in low position, maintain call bell within reach at all times, provide assistance with transfer out of bed and ambulation.  

## 2015-07-25 ENCOUNTER — Telehealth: Payer: Self-pay | Admitting: *Deleted

## 2015-07-25 NOTE — Telephone Encounter (Signed)
Denies problems post procedure. 

## 2015-08-22 ENCOUNTER — Encounter: Payer: Self-pay | Admitting: Pain Medicine

## 2015-08-22 ENCOUNTER — Ambulatory Visit: Payer: PPO | Attending: Pain Medicine | Admitting: Pain Medicine

## 2015-08-22 VITALS — BP 112/54 | HR 77 | Temp 98.4°F | Resp 16 | Ht 60.0 in | Wt 140.0 lb

## 2015-08-22 DIAGNOSIS — M5416 Radiculopathy, lumbar region: Secondary | ICD-10-CM | POA: Diagnosis not present

## 2015-08-22 DIAGNOSIS — M542 Cervicalgia: Secondary | ICD-10-CM | POA: Diagnosis not present

## 2015-08-22 DIAGNOSIS — M47816 Spondylosis without myelopathy or radiculopathy, lumbar region: Secondary | ICD-10-CM

## 2015-08-22 DIAGNOSIS — M5481 Occipital neuralgia: Secondary | ICD-10-CM

## 2015-08-22 DIAGNOSIS — Z79899 Other long term (current) drug therapy: Secondary | ICD-10-CM | POA: Diagnosis not present

## 2015-08-22 DIAGNOSIS — M5136 Other intervertebral disc degeneration, lumbar region: Secondary | ICD-10-CM | POA: Diagnosis present

## 2015-08-22 DIAGNOSIS — M545 Low back pain, unspecified: Secondary | ICD-10-CM

## 2015-08-22 DIAGNOSIS — G90523 Complex regional pain syndrome I of lower limb, bilateral: Secondary | ICD-10-CM

## 2015-08-22 DIAGNOSIS — G894 Chronic pain syndrome: Secondary | ICD-10-CM

## 2015-08-22 DIAGNOSIS — M5030 Other cervical disc degeneration, unspecified cervical region: Secondary | ICD-10-CM

## 2015-08-22 DIAGNOSIS — M533 Sacrococcygeal disorders, not elsewhere classified: Secondary | ICD-10-CM

## 2015-08-22 DIAGNOSIS — M5137 Other intervertebral disc degeneration, lumbosacral region: Secondary | ICD-10-CM

## 2015-08-22 DIAGNOSIS — M51379 Other intervertebral disc degeneration, lumbosacral region without mention of lumbar back pain or lower extremity pain: Secondary | ICD-10-CM

## 2015-08-22 DIAGNOSIS — G8929 Other chronic pain: Secondary | ICD-10-CM

## 2015-08-22 DIAGNOSIS — M51369 Other intervertebral disc degeneration, lumbar region without mention of lumbar back pain or lower extremity pain: Secondary | ICD-10-CM

## 2015-08-22 DIAGNOSIS — G90521 Complex regional pain syndrome I of right lower limb: Secondary | ICD-10-CM

## 2015-08-22 MED ORDER — HYDROCODONE-ACETAMINOPHEN 5-325 MG PO TABS: ORAL_TABLET | ORAL | Status: DC

## 2015-08-22 MED ORDER — TIZANIDINE HCL 2 MG PO CAPS: ORAL_CAPSULE | ORAL | Status: DC

## 2015-08-22 NOTE — Patient Instructions (Addendum)
Plan    Continue present medication hydrocodone acetaminophen and Zanaflex  Lumbosacral selective nerve root block to be performed at time return appointment  F/U PCP Dr. Carlis Abbott for evaliation of  BP and general medical  condition  F/U surgical evaluation as discussed  F/U neurological evaluation. May consider pending follow-up evaluations  May consider radiofrequency rhizolysis or intraspinal procedures pending response to present treatment and F/U evaluation   Patient to call [facility] should patient have concerns prior to scheduled return appointment.Selective Nerve Root Block Patient Information  Description: Specific nerve roots exit the spinal canal and these nerves can be compressed and inflamed by a bulging disc and bone spurs.  By injecting steroids on the nerve root, we can potentially decrease the inflammation surrounding these nerves, which often leads to decreased pain.  Also, by injecting local anesthesia on the nerve root, this can provide [place] helpful information to give to your referring doctor if it decreases your pain.  Selective nerve root blocks can be done along the spine from the neck to the low back depending on the location of your pain.   After numbing the skin with local anesthesia, a small needle is passed to the nerve root and the position of the needle is verified using x-ray pictures.  After the needle is in correct position, we then deposit the medication.  You may experience a pressure sensation while this is being done.  The entire block usually lasts less than 15 minutes.  Conditions that may be treated with selective nerve root blocks:  Low back and leg pain  Spinal stenosis  Diagnostic block prior to potential surgery  Neck and arm pain  Post laminectomy syndrome  Preparation for the injection:  1. Do not eat any solid food or dairy products within 6 hours of your appointment. 2. You may drink clear liquids up to 2 hours before an  appointment.  Clear liquids include water, black coffee, juice or soda.  No milk or cream please. 3. You may take your regular medications, including pain medications, with a sip of water before your appointment.  Diabetics should hold regular insulin (if taken separately) and take 1/2 normal NPH dose the morning of the procedure.  Carry some sugar containing items with you to your appointment. 4. A driver must accompany you and be prepared to drive you home after your procedure. 5. Bring all your current medications with you. 6. An IV may be inserted and sedation may be given at the discretion of the physician. 7. A blood pressure cuff, EKG, and other monitors will often be applied during the procedure.  Some patients may need to have extra oxygen administered for a short period. 8. You will be asked to provide medical information, including allergies, prior to the procedure.  We must know immediately if you are taking blood  Thinners (like Coumadin) or if you are allergic to IV iodine contrast (dye).  Possible side-effects: All are usually temporary  Bleeding from needle site  Light headedness  Numbness and tingling  Decreased blood pressure  Weakness in arms/legs  Pressure sensation in back/neck  Pain at injection site (several days)  Possible complications: All are extremely rare  Infection  Nerve injury  Spinal headache (a headache wore with upright position)  Call if you experience:  Fever/chills associated with headache or increased back/neck pain  Headache worsened by an upright position  New onset weakness or numbness of an extremity below the injection site  Hives or difficulty  breathing (go to the emergency room)  Inflammation or drainage at the injection site(s)  Severe back/neck pain greater than usual  New symptoms which are concerning to you  Please note:  Although the local anesthetic injected can often make your back or neck feel good for  several hours after the injection the pain will likely return.  It takes 3-5 days for steroids to work on the nerve root. You may not notice any pain relief for at least one week.  If effective, we will often do a series of 3 injections spaced 3-6 weeks apart to maximally decrease your pain.    If you have any questions, please call [phone] [facility] Clinic[facility] R[facility] AND COMPLICATIONS  What are the risk, side effects and possible complications? Generally speaking, most procedures are safe.  However, with any procedure there are risks, side effects, and the possibility of complications.  The risks and complications are dependent upon the sites that are lesioned, or the type of nerve block to be performed.  The closer the procedure is to the spine, the more serious the risks are.  Great care is taken when placing the radio frequency needles, block needles or lesioning probes, but sometimes complications can occur. 1. Infection: Any time there is an injection through the skin, there is a risk of infection.  This is why sterile conditions are used for these blocks.  There are four possible types of infection. 1. Localized skin infection. 2. Central Nervous System Infection-This can be in the form of Meningitis, which can be deadly. 3. Epidural Infections-This can be in the form of an epidural abscess, which can cause pressure inside of the spine, causing compression of the spinal cord with subsequent paralysis. This would require an emergency surgery to decompress, and there are no guarantees that the patient would recover from the paralysis. 4. Discitis-This is an infection of the intervertebral discs.  It occurs in about 1% of discography procedures.  It is difficult to treat and it may lead to surgery.        2. Pain: the needles have to go through skin and soft tissues, will cause soreness.       3. Damage to internal structures:  The nerves to be lesioned may be  near blood vessels or    other nerves which can be potentially damaged.       4. Bleeding: Bleeding is more common if the patient is taking blood thinners such as  aspirin, Coumadin, Ticiid, Plavix, etc., or if he/she have some genetic predisposition  such as hemophilia. Bleeding into the spinal canal can cause compression of the spinal  cord with subsequent paralysis.  This would require an emergency surgery to  decompress and there are no guarantees that the patient would recover from the  paralysis.       5. Pneumothorax:  Puncturing of a lung is a possibility, every time a needle is introduced in  the area of the chest or upper back.  Pneumothorax refers to free air around the  collapsed lung(s), inside of the thoracic cavity (chest cavity).  Another two possible  complications related to a similar event would include: Hemothorax and Chylothorax.   These are variations of the Pneumothorax, where instead of air around the collapsed  lung(s), you may have blood or chyle, respectively.       6. Spinal headaches: They may occur with any procedures in the area of the spine.  7. Persistent CSF (Cerebro-Spinal Fluid) leakage: This is a rare problem, but may occur  with prolonged intrathecal or epidural catheters either due to the formation of a fistulous  track or a dural tear.       8. Nerve damage: By working so close to the spinal cord, there is always a possibility of  nerve damage, which could be as serious as a permanent spinal cord injury with  paralysis.       9. Death:  Although rare, severe deadly allergic reactions known as "Anaphylactic  reaction" can occur to any of the medications used.      10. Worsening of the symptoms:  We can always make thing worse.  What are the chances of something like this happening? Chances of any of this occuring are extremely low.  By statistics, you have more of a chance of getting killed in a motor vehicle accident: while driving to the hospital than any of  the above occurring .  Nevertheless, you should be aware that they are possibilities.  In general, it is similar to taking a shower.  Everybody knows that you can slip, hit your head and get killed.  Does that mean that you should not shower again?  Nevertheless always keep in mind that statistics do not mean anything if you happen to be on the wrong side of them.  Even if a procedure has a 1 (one) in a 1,000,000 (million) chance of going wrong, it you happen to be that one..Also, keep in mind that by statistics, you have more of a chance of having something go wrong when taking medications.  Who should not have this procedure? If you are on a blood thinning medication (e.g. Coumadin, Plavix, see list of "Blood Thinners"), or if you have an active infection going on, you should not have the procedure.  If you are taking any blood thinners, please inform your physician.  How should I prepare for this procedure?  Do not eat or drink anything at least six hours prior to the procedure.  Bring a driver with you .  It cannot be a taxi.  Come accompanied by an adult that can drive you back, and that is strong enough to help you if your legs get weak or numb from the local anesthetic.  Take all of your medicines the morning of the procedure with just enough water to swallow them.  If you have diabetes, make sure that you are scheduled to have your procedure done first thing in the morning, whenever possible.  If you have diabetes, take only half of your insulin dose and notify our nurse that you have done so as soon as you arrive at the clinic.  If you are diabetic, but only take blood sugar pills (oral hypoglycemic), then do not take them on the morning of your procedure.  You may take them after you have had the procedure.  Do not take aspirin or any aspirin-containing medications, at least eleven (11) days prior to the procedure.  They may prolong bleeding.  Wear loose fitting clothing that may be  easy to take off and that you would not mind if it got stained with Betadine or blood.  Do not wear any jewelry or perfume  Remove any nail coloring.  It will interfere with some of our monitoring equipment.  NOTE: Remember that this is not meant to be interpreted as a complete list of all possible complications.  Unforeseen problems may occur.  BLOOD THINNERS  The following drugs contain aspirin or other products, which can cause increased bleeding during surgery and should not be taken for 2 weeks prior to and 1 week after surgery.  If you should need take something for relief of minor pain, you may take acetaminophen which is found in Tylenol,m Datril, Anacin-3 and Panadol. It is not blood thinner. The products listed below are.  Do not take any of the products listed below in addition to any listed on your instruction sheet.  A.P.C or A.P.C with Codeine Codeine Phosphate Capsules #3 Ibuprofen Ridaura  ABC compound Congesprin Imuran rimadil  Advil Cope Indocin Robaxisal  Alka-Seltzer Effervescent Pain Reliever and Antacid Coricidin or Coricidin-D  Indomethacin Rufen  Alka-Seltzer plus Cold Medicine Cosprin Ketoprofen S-A-C Tablets  Anacin Analgesic Tablets or Capsules Coumadin Korlgesic Salflex  Anacin Extra Strength Analgesic tablets or capsules CP-2 Tablets Lanoril Salicylate  Anaprox Cuprimine Capsules Levenox Salocol  Anexsia-D Dalteparin Magan Salsalate  Anodynos Darvon compound Magnesium Salicylate Sine-off  Ansaid Dasin Capsules Magsal Sodium Salicylate  Anturane Depen Capsules Marnal Soma  APF Arthritis pain formula Dewitt's Pills Measurin Stanback  Argesic Dia-Gesic Meclofenamic Sulfinpyrazone  Arthritis Bayer Timed Release Aspirin Diclofenac Meclomen Sulindac  Arthritis pain formula Anacin Dicumarol Medipren Supac  Analgesic (Safety coated) Arthralgen Diffunasal Mefanamic Suprofen  Arthritis Strength Bufferin Dihydrocodeine Mepro Compound Suprol  Arthropan liquid  Dopirydamole Methcarbomol with Aspirin Synalgos  ASA tablets/Enseals Disalcid Micrainin Tagament  Ascriptin Doan's Midol Talwin  Ascriptin A/D Dolene Mobidin Tanderil  Ascriptin Extra Strength Dolobid Moblgesic Ticlid  Ascriptin with Codeine Doloprin or Doloprin with Codeine Momentum Tolectin  Asperbuf Duoprin Mono-gesic Trendar  Aspergum Duradyne Motrin or Motrin IB Triminicin  Aspirin plain, buffered or enteric coated Durasal Myochrisine Trigesic  Aspirin Suppositories Easprin Nalfon Trillsate  Aspirin with Codeine Ecotrin Regular or Extra Strength Naprosyn Uracel  Atromid-S Efficin Naproxen Ursinus  Auranofin Capsules Elmiron Neocylate Vanquish  Axotal Emagrin Norgesic Verin  Azathioprine Empirin or Empirin with Codeine Normiflo Vitamin E  Azolid Emprazil Nuprin Voltaren  Bayer Aspirin plain, buffered or children's or timed BC Tablets or powders Encaprin Orgaran Warfarin Sodium  Buff-a-Comp Enoxaparin Orudis Zorpin  Buff-a-Comp with Codeine Equegesic Os-Cal-Gesic   Buffaprin Excedrin plain, buffered or Extra Strength Oxalid   Bufferin Arthritis Strength Feldene Oxphenbutazone   Bufferin plain or Extra Strength Feldene Capsules Oxycodone with Aspirin   Bufferin with Codeine Fenoprofen Fenoprofen Pabalate or Pabalate-SF   Buffets II Flogesic Panagesic   Buffinol plain or Extra Strength Florinal or Florinal with Codeine Panwarfarin   Buf-Tabs Flurbiprofen Penicillamine   Butalbital Compound Four-way cold tablets Penicillin   Butazolidin Fragmin Pepto-Bismol   Carbenicillin Geminisyn Percodan   Carna Arthritis Reliever Geopen Persantine   Carprofen Gold's salt Persistin   Chloramphenicol Goody's Phenylbutazone   Chloromycetin Haltrain Piroxlcam   Clmetidine heparin Plaquenil   Cllnoril Hyco-pap Ponstel   Clofibrate Hydroxy chloroquine Propoxyphen         Before stopping any of these medications, be sure to consult the physician who ordered them.  Some, such as Coumadin (Warfarin)  are ordered to prevent or treat serious conditions such as "deep thrombosis", "pumonary embolisms", and other heart problems.  The amount of time that you may need off of the medication may also vary with the medication and the reason for which you were taking it.  If you are taking any of these medications, please make sure you notify your pain physician before you undergo any procedures.

## 2015-08-22 NOTE — Progress Notes (Signed)
   Subjective:    Patient ID: Karen Edwards, female    DOB: 1965/11/19, 49 y.o.   MRN: [id number]  HPI Patient is 49 year old female [facility] for further evaluation and treatment of pain involving the lower back and lower extremity region. Patient states that her pain is increased with standing walking and the pain becomes more intense as the day progresses. Patient states the pain radiates from the lower back to the lower extremity regions on the right as well as on the left. Patient denies any recent trauma change in events of daily living the cause change in symptomatology. We will proceed with lumbosacral selective nerve root block at time return appointment in attempt to decrease severity of symptoms, minimize progression of symptoms, and avoid need for more involved treatment. The patient was understanding and in agreement status treatment plan. At the present time patient is with palpation surgical intervention. We will consider surgical reevaluation as well as PNCV EMG studies and other studies pending response to present treatment regimen consisting of lumbosacral selective nerve root blocks to be performed at time return appointment. The patient was with understanding and in agreement status treatment plan. The patient was also with complaint of pain occurring in the region of the neck with pain in the neck bleeding to the back of the head.   Review of Systems     Objective:   Physical Exam  There was tenderness of the splenius capitis and occipitalis musculature region of moderate degree. Palpation of the cervical facet cervical paraspinal muscles reproduced moderate discomfort. There was tenderness over the region of the acromioclavicular and glenohumeral joint region a mild degree. There appeared to be unremarkable Spurling's maneuver and. Patient appeared to be with bilaterally equal grip strength. Tinel and Phalen's maneuver without increased pain of  significant degree. Palpation over the thoracic facet thoracic paraspinal muscles reproduced mild discomfort. There was moderate muscle spasms of the lower thoracic paraspinal musculature region. Palpation over the lumbar paraspinal muscles reproduced moderate moderate to severe discomfort. Lateral bending rotation and extension and palpation lumbar facets reproduce moderate discomfort. There was moderate tenderness of the PSIS PSIS region as well as the gluteal and piriformis musculature regions. DTRs were trace at the knees. No definite sensory deficit of dermatomal distribution detected. EHL strength appeared to be decreased. Abdomen was nontender with no costovertebral angle tenderness noted.      Assessment & Plan:     Degenerative disc disease lumbar spine Degenerative changes lumbar spine L1-2 level degenerative disc disease with right central disc extrusion cranial migration of disc material  Lumbar facet syndrome  Lumbar radiculopathy  Sacroiliac joint dysfunction  Bilateral occipital neuralgia  Degenerative disc disease cervical spine    PLAN   Continue present medication oxycodone and acetaminophen  Lumbosacral selective nerve root block to be considered at time return appointment  F/U PCP Dr. Carlis Abbott for evaliation of  BP and general medical  condition  F/U surgical evaluation as discussed  F/U neurological evaluation. May consider PNCV EMG studies as discussed   May consider radiofrequency rhizolysis or intraspinal procedures pending response to present treatment and F/U evaluation   Patient to call [facility] should patient have concerns prior to scheduled return appointmen.

## 2015-08-22 NOTE — Progress Notes (Signed)
Safety precautions to be maintained throughout the outpatient stay will include: orient to surroundings, keep bed in low position, maintain call bell within reach at all times, provide assistance with transfer out of bed and ambulation.  

## 2015-08-28 ENCOUNTER — Ambulatory Visit: Payer: PPO | Attending: Pain Medicine | Admitting: Pain Medicine

## 2015-08-28 ENCOUNTER — Encounter: Payer: Self-pay | Admitting: Pain Medicine

## 2015-08-28 VITALS — BP 100/56 | HR 53 | Temp 97.4°F | Resp 16 | Ht 60.0 in | Wt 140.0 lb

## 2015-08-28 DIAGNOSIS — M47816 Spondylosis without myelopathy or radiculopathy, lumbar region: Secondary | ICD-10-CM

## 2015-08-28 DIAGNOSIS — M5416 Radiculopathy, lumbar region: Secondary | ICD-10-CM

## 2015-08-28 DIAGNOSIS — M5136 Other intervertebral disc degeneration, lumbar region: Secondary | ICD-10-CM | POA: Insufficient documentation

## 2015-08-28 DIAGNOSIS — M5030 Other cervical disc degeneration, unspecified cervical region: Secondary | ICD-10-CM

## 2015-08-28 DIAGNOSIS — M5137 Other intervertebral disc degeneration, lumbosacral region: Secondary | ICD-10-CM

## 2015-08-28 DIAGNOSIS — G894 Chronic pain syndrome: Secondary | ICD-10-CM

## 2015-08-28 DIAGNOSIS — G90521 Complex regional pain syndrome I of right lower limb: Secondary | ICD-10-CM

## 2015-08-28 DIAGNOSIS — M533 Sacrococcygeal disorders, not elsewhere classified: Secondary | ICD-10-CM

## 2015-08-28 DIAGNOSIS — G90523 Complex regional pain syndrome I of lower limb, bilateral: Secondary | ICD-10-CM

## 2015-08-28 DIAGNOSIS — M5481 Occipital neuralgia: Secondary | ICD-10-CM

## 2015-08-28 DIAGNOSIS — M545 Low back pain: Secondary | ICD-10-CM | POA: Insufficient documentation

## 2015-08-28 MED ORDER — FENTANYL CITRATE (PF) 100 MCG/2ML IJ SOLN
100.0000 ug | Freq: Once | INTRAMUSCULAR | Status: AC
  Administered 2015-08-28: 100 ug via INTRAVENOUS

## 2015-08-28 MED ORDER — BUPIVACAINE HCL (PF) 0.25 % IJ SOLN
30.0000 mL | Freq: Once | INTRAMUSCULAR | Status: AC
  Administered 2015-08-28: 30 mL

## 2015-08-28 MED ORDER — MIDAZOLAM HCL 5 MG/5ML IJ SOLN
5.0000 mg | Freq: Once | INTRAMUSCULAR | Status: AC
  Administered 2015-08-28: 5 mg via INTRAVENOUS

## 2015-08-28 MED ORDER — LACTATED RINGERS IV SOLN: 1000.0000 mL | INTRAVENOUS | Status: DC

## 2015-08-28 MED ORDER — TRIAMCINOLONE ACETONIDE 40 MG/ML IJ SUSP
INTRAMUSCULAR | Status: AC
  Administered 2015-08-28: 40 mg
  Filled 2015-08-28: qty 1

## 2015-08-28 MED ORDER — LIDOCAINE HCL (PF) 1 % IJ SOLN: 10.0000 mL | Freq: Once | INTRAMUSCULAR | Status: DC

## 2015-08-28 MED ORDER — BUPIVACAINE HCL (PF) 0.25 % IJ SOLN
INTRAMUSCULAR | Status: AC
  Administered 2015-08-28: 30 mL
  Filled 2015-08-28: qty 30

## 2015-08-28 MED ORDER — FENTANYL CITRATE (PF) 100 MCG/2ML IJ SOLN
INTRAMUSCULAR | Status: AC
  Administered 2015-08-28: 100 ug via INTRAVENOUS
  Filled 2015-08-28: qty 2

## 2015-08-28 MED ORDER — MIDAZOLAM HCL 5 MG/5ML IJ SOLN
INTRAMUSCULAR | Status: AC
  Administered 2015-08-28: 5 mg via INTRAVENOUS
  Filled 2015-08-28: qty 5

## 2015-08-28 MED ORDER — ORPHENADRINE CITRATE 30 MG/ML IJ SOLN
60.0000 mg | Freq: Once | INTRAMUSCULAR | Status: AC
  Administered 2015-08-28: 60 mg via INTRAMUSCULAR

## 2015-08-28 MED ORDER — ORPHENADRINE CITRATE 30 MG/ML IJ SOLN
INTRAMUSCULAR | Status: AC
  Administered 2015-08-28: 60 mg via INTRAMUSCULAR
  Filled 2015-08-28: qty 2

## 2015-08-28 MED ORDER — TRIAMCINOLONE ACETONIDE 40 MG/ML IJ SUSP
40.0000 mg | Freq: Once | INTRAMUSCULAR | Status: AC
  Administered 2015-08-28: 40 mg

## 2015-08-28 NOTE — Progress Notes (Signed)
Subjective:    Patient ID: Karen Edwards, female    DOB: [birth year], 49 y.o.   MRN: [id number]  HPI  PROCEDURE PERFORMED: Lumbosacral selective nerve root block   NOTE: The patient is a 49 y.o. female who returns to [place] for further evaluation and treatment of pain involving the lumbar and lower extremity region. Studies consisting of MRI  has revealed the patient to be with evidence of Degenerative disc disease lumbar spine Degenerative changes lumbar spine L1-2 level degenerative disc disease with right central disc extrusion cranial migration of disc materialThere is concern regarding intraspinal abnormalities contributing to the patient's symptomatology. The risks, benefits, and expectations of the procedure have been explained to the patient who was understanding and in agreement with suggested treatment plan. We will proceed with interventional treatment as discussed and as explained to the patient. The patient is understanding and in agreement with suggested treatment plan.   DESCRIPTION OF PROCEDURE: Lumbosacral selective nerve root block with IV Versed, IV fentanyl conscious sedation, EKG, blood pressure, pulse, and pulse oximetry monitoring. The procedure was performed with the patient in the prone position under fluoroscopic guidance. With the patient in the prone position, Betadine prep of proposed entry site was performed. Local anesthetic skin wheal of proposed needle entry site was prepared with 1.5% plain lidocaine with AP view of the lumbosacral spine.   PROCEDURE #1: Needle placement at the left L2 vertebral body: A 22 -gauge needle was inserted at the inferior border of the transverse process of the vertebral body with needle placed medial to the midline of the transverse process on AP view of the lumbosacral spine.   NEEDLE PLACEMENT AT  L3,L4, and  L5  VERTEBRAL BODY LEVELS  Needle  placement was accomplished at L3, L4, and L5  vertebral body levels on the  left  side exactly as was accomplished at the L3, L4, AND L5  vertebral body level  and utilizing the same technique and under fluoroscopic guidance.    Needle placement was then verified on lateral view at all levels with needle tip documented to be in the posterior superior quadrant of the intervertebral foramen of  L 2, L3, L4, and L5. Following negative aspiration for heme and CSF at each level, each level was injected with 3 mL of 0.25% bupivacaine with Kenalog.   LUMBOSACRAL SELECTIVE NERVE ROOT BLOCKS THE THE  RIGHT SIDE  The procedure was performed on the right side exactly as was performed on the left side and at the same levels  Under fluoroscopic guidance and utilizing the same technique.    The patient tolerated the procedure well. A total of 10 mg of Kenalog was utilized for the procedure.   PLAN:  1. Medications: Will continue presently prescribed medications. 2. The patient is to undergo follow-up evaluation with PCP Dr. Loletta Specter for evaluation of blood pressure and general medical condition status post procedure performed on today's visit. 3. Surgical follow-up evaluation. 4. Neurological evaluation. 5. May consider radiofrequency procedures, implantation type procedures and other treatment pending response to treatment and follow-up evaluation. 6. The patient has been advise do adhere to proper body mechanics and avoid activities which may aggravate condition. 7. The patient has been advised to call the [facility] Center prior to scheduled return appointment should there be significant change in the patient's condition or should the patient have other concerns regarding condition prior to scheduled return appointment.   Review of Systems     Objective:  Physical Exam        Assessment & Plan:

## 2015-08-28 NOTE — Patient Instructions (Addendum)
Plan    Continue present medication hydrocodone acetaminophen and Zanaflex  Lumbosacral selective nerve root block to be performed at time return appointment  F/U PCP Dr. Carlis Abbott for evaliation of  BP and general medical  condition  F/U surgical evaluation as discussed  F/U neurological evaluation. May consider pending follow-up evaluations  May consider radiofrequency rhizolysis or intraspinal procedures pending response to present treatment and F/U evaluation   Pain Management Discharge Instructions  General Discharge Instructions :  If you need to reach your doctor call: Monday-Friday 8:00 am - 4:00 pm at [phone] or toll free [phone].  After clinic hours [phone] to have operator reach doctor.  Bring all of your medication bottles to all your appointments in the pain clinic.  To cancel or reschedule your appointment with Pain Management please remember to call 24 hours in advance to avoid a fee.  Refer to the educational materials which you have been given on: General Risks, I had my Procedure. Discharge Instructions, Post Sedation.  Post Procedure Instructions:  The drugs you were given will stay in your system until tomorrow, so for the next 24 hours you should not drive, make any legal decisions or drink any alcoholic beverages.  You may eat anything you prefer, but it is better to start with liquids then soups and crackers, and gradually work up to solid foods.  Please notify your doctor immediately if you have any unusual bleeding, trouble breathing or pain that is not related to your normal pain.  Depending on the type of procedure that was done, some parts of your body may feel week and/or numb.  This usually clears up by tonight or the next day.  Walk with the use of an assistive device or accompanied by an adult for the 24 hours.  You may use ice on the affected area for the first 24 hours.  Put ice in a Ziploc bag and cover with a towel and place  against area 15 minutes on 15 minutes off.  You may switch to heat after 24 hours.

## 2015-08-28 NOTE — Progress Notes (Signed)
Safety precautions to be maintained throughout the outpatient stay will include: orient to surroundings, keep bed in low position, maintain call bell within reach at all times, provide assistance with transfer out of bed and ambulation.  

## 2015-08-29 ENCOUNTER — Telehealth: Payer: Self-pay | Admitting: *Deleted

## 2015-08-29 NOTE — Telephone Encounter (Signed)
Left voicemail for patient to call our office if they are having problems, questions or concerns.

## 2015-09-19 ENCOUNTER — Encounter: Payer: Self-pay | Admitting: Pain Medicine

## 2015-09-19 ENCOUNTER — Ambulatory Visit: Payer: PPO | Attending: Pain Medicine | Admitting: Pain Medicine

## 2015-09-19 VITALS — BP 118/58 | HR 67 | Temp 98.6°F | Resp 18 | Ht 60.0 in | Wt 135.0 lb

## 2015-09-19 DIAGNOSIS — M5416 Radiculopathy, lumbar region: Secondary | ICD-10-CM

## 2015-09-19 DIAGNOSIS — M5030 Other cervical disc degeneration, unspecified cervical region: Secondary | ICD-10-CM

## 2015-09-19 DIAGNOSIS — M533 Sacrococcygeal disorders, not elsewhere classified: Secondary | ICD-10-CM | POA: Diagnosis not present

## 2015-09-19 DIAGNOSIS — M545 Low back pain, unspecified: Secondary | ICD-10-CM

## 2015-09-19 DIAGNOSIS — M51369 Other intervertebral disc degeneration, lumbar region without mention of lumbar back pain or lower extremity pain: Secondary | ICD-10-CM

## 2015-09-19 DIAGNOSIS — M47816 Spondylosis without myelopathy or radiculopathy, lumbar region: Secondary | ICD-10-CM

## 2015-09-19 DIAGNOSIS — G894 Chronic pain syndrome: Secondary | ICD-10-CM

## 2015-09-19 DIAGNOSIS — M5137 Other intervertebral disc degeneration, lumbosacral region: Secondary | ICD-10-CM

## 2015-09-19 DIAGNOSIS — M5126 Other intervertebral disc displacement, lumbar region: Secondary | ICD-10-CM | POA: Insufficient documentation

## 2015-09-19 DIAGNOSIS — M5481 Occipital neuralgia: Secondary | ICD-10-CM

## 2015-09-19 DIAGNOSIS — M51379 Other intervertebral disc degeneration, lumbosacral region without mention of lumbar back pain or lower extremity pain: Secondary | ICD-10-CM

## 2015-09-19 DIAGNOSIS — M5136 Other intervertebral disc degeneration, lumbar region: Secondary | ICD-10-CM

## 2015-09-19 DIAGNOSIS — G90521 Complex regional pain syndrome I of right lower limb: Secondary | ICD-10-CM

## 2015-09-19 DIAGNOSIS — G8929 Other chronic pain: Secondary | ICD-10-CM

## 2015-09-19 DIAGNOSIS — M5116 Intervertebral disc disorders with radiculopathy, lumbar region: Secondary | ICD-10-CM | POA: Diagnosis not present

## 2015-09-19 DIAGNOSIS — G90523 Complex regional pain syndrome I of lower limb, bilateral: Secondary | ICD-10-CM

## 2015-09-19 DIAGNOSIS — R51 Headache: Secondary | ICD-10-CM | POA: Diagnosis present

## 2015-09-19 DIAGNOSIS — M542 Cervicalgia: Secondary | ICD-10-CM | POA: Diagnosis present

## 2015-09-19 MED ORDER — TIZANIDINE HCL 2 MG PO CAPS: ORAL_CAPSULE | ORAL | Status: DC

## 2015-09-19 MED ORDER — HYDROCODONE-ACETAMINOPHEN 5-325 MG PO TABS: ORAL_TABLET | ORAL | Status: DC

## 2015-09-19 NOTE — Progress Notes (Signed)
Safety precautions to be maintained throughout the outpatient stay will include: orient to surroundings, keep bed in low position, maintain call bell within reach at all times, provide assistance with transfer out of bed and ambulation.  

## 2015-09-19 NOTE — Progress Notes (Signed)
   Subjective:    Patient ID: Karen Edwards, female    DOB: 12-Jul-1966, 49 y.o.   MRN: [id number]  HPI  49 year old female who returns to pain management Center for further evaluation and treatment of pain involving the neck with pain of the neck radiating to the back of the head associated with headaches. Patient with pain involving the lower back lower extremity region aggravated by twisting turning maneuvers and becoming more intense as patient spends more time on the feet. She denies any trauma change in events of daily living the call significant change in symptomatology. We will proceed with cervical MRI and will performed lumbosacral selective nerve root blocks the lower back and lower extremity pain at time return appointment. We discuss surgical evaluation and will consider patient for surgical evaluation as well as additional modifications of treatment regimen pending results of cervical MRI and response to lumbar sacral selective nerve root blocks. The patient agreed to suggested treatment plan.     Review of Systems     Objective:   Physical Exam  There was tenderness of the splenius capitis and a separate talus region palpation which reproduces moderate discomfort. There was moderate tensed palpation of the cervical facet cervical paraspinal musculature region. There appeared to be unremarkable Spurling's maneuver. Patient was with tenderness of the acromioclavicular and glenohumeral joint regions a moderate degree. Tinel and Phalen's maneuver were without increased pain of significant degree. There was tends to palpation thoracic facet thoracic paraspinal muscles regional moderate to stand of the mid and regions. No crepitus of the thoracic region noted. Palpation over the lumbar paraspinal muscles lumbar facet region associated with moderate to moderately severe discomfort with lateral bending rotation extension and palpation of the lumbar facets reproducing moderate severe  discomfort. Straight leg raising limited to approximately 20 without increase of pain with dorsiflexion noted. DTRs appeared to be trace at the knees. There was tenderness to palpation over the PSIS and PI I S region of moderate degree. Palpation over the region of the greater trochanteric region iliotibial band region was attends to palpation of moderate degree as well no definite sensory deficit of dermatomal dystrophy detected. There was negative clonus negative Homans. Abdomen nontender with no costovertebral tenderness noted.    Assessment & Plan:     Degenerative disc disease lumbar spine Degenerative changes lumbar spine L1-2 level degenerative disc disease with right central disc extrusion cranial migration of disc material  Lumbar facet syndrome  Lumbar radiculopathy  Sacroiliac joint dysfunction  Bilateral occipital neuralgia  Degenerative disc disease cervical spine    PLAN   Continue present medication hydrocodone acetaminophen and Zanaflex  Lumbosacral selective nerve root block to be performed at time of return appointment  F/U PCP Dr. Carlis Abbott for evaliation of  BP and general medical  condition  F/U surgical evaluation as discussed May consider surgical evaluation  F/U neurological evaluation. May consider pending follow-up evaluations  Cervical MRI. Ask Shatoya date of his cervical MRI  May consider radiofrequency rhizolysis or intraspinal procedures pending response to present treatment and F/U evaluation   Patient to call Pain Management Center should patient have concerns prior to scheduled return appointmen

## 2015-09-19 NOTE — Patient Instructions (Addendum)
Plan    Continue present medication hydrocodone acetaminophen and Zanaflex  Lumbosacral selective nerve root block to be performed at time of return appointment  F/U PCP Dr. Carlis Abbott for evaliation of  BP and general medical  condition  F/U surgical evaluation as discussed May consider surgical evaluation  F/U neurological evaluation. May consider pending follow-up evaluations  Cervical MRI. Ask Shatoya date of his cervical MRI  May consider radiofrequency rhizolysis or intraspinal procedures pending response to present treatment and F/U evaluation   Patient to call Pain Management Center should patient have concerns prior to scheduled return appointmentGENERAL RISKS AND COMPLICATIONS  What are the risk, side effects and possible complications? Generally speaking, most procedures are safe.  However, with any procedure there are risks, side effects, and the possibility of complications.  The risks and complications are dependent upon the sites that are lesioned, or the type of nerve block to be performed.  The closer the procedure is to the spine, the more serious the risks are.  Great care is taken when placing the radio frequency needles, block needles or lesioning probes, but sometimes complications can occur. 1. Infection: Any time there is an injection through the skin, there is a risk of infection.  This is why sterile conditions are used for these blocks.  There are four possible types of infection. 1. Localized skin infection. 2. Central Nervous System Infection-This can be in the form of Meningitis, which can be deadly. 3. Epidural Infections-This can be in the form of an epidural abscess, which can cause pressure inside of the spine, causing compression of the spinal cord with subsequent paralysis. This would require an emergency surgery to decompress, and there are no guarantees that the patient would recover from the paralysis. 4. Discitis-This is an infection of the intervertebral  discs.  It occurs in about 1% of discography procedures.  It is difficult to treat and it may lead to surgery.        2. Pain: the needles have to go through skin and soft tissues, will cause soreness.       3. Damage to internal structures:  The nerves to be lesioned may be near blood vessels or    other nerves which can be potentially damaged.       4. Bleeding: Bleeding is more common if the patient is taking blood thinners such as  aspirin, Coumadin, Ticiid, Plavix, etc., or if he/she have some genetic predisposition  such as hemophilia. Bleeding into the spinal canal can cause compression of the spinal  cord with subsequent paralysis.  This would require an emergency surgery to  decompress and there are no guarantees that the patient would recover from the  paralysis.       5. Pneumothorax:  Puncturing of a lung is a possibility, every time a needle is introduced in  the area of the chest or upper back.  Pneumothorax refers to free air around the  collapsed lung(s), inside of the thoracic cavity (chest cavity).  Another two possible  complications related to a similar event would include: Hemothorax and Chylothorax.   These are variations of the Pneumothorax, where instead of air around the collapsed  lung(s), you may have blood or chyle, respectively.       6. Spinal headaches: They may occur with any procedures in the area of the spine.       7. Persistent CSF (Cerebro-Spinal Fluid) leakage: This is a rare problem, but may occur  with prolonged intrathecal or  epidural catheters either due to the formation of a fistulous  track or a dural tear.       8. Nerve damage: By working so close to the spinal cord, there is always a possibility of  nerve damage, which could be as serious as a permanent spinal cord injury with  paralysis.       9. Death:  Although rare, severe deadly allergic reactions known as "Anaphylactic  reaction" can occur to any of the medications used.      10. Worsening of the  symptoms:  We can always make thing worse.  What are the chances of something like this happening? Chances of any of this occuring are extremely low.  By statistics, you have more of a chance of getting killed in a motor vehicle accident: while driving to the hospital than any of the above occurring .  Nevertheless, you should be aware that they are possibilities.  In general, it is similar to taking a shower.  Everybody knows that you can slip, hit your head and get killed.  Does that mean that you should not shower again?  Nevertheless always keep in mind that statistics do not mean anything if you happen to be on the wrong side of them.  Even if a procedure has a 1 (one) in a 1,000,000 (million) chance of going wrong, it you happen to be that one..Also, keep in mind that by statistics, you have more of a chance of having something go wrong when taking medications.  Who should not have this procedure? If you are on a blood thinning medication (e.g. Coumadin, Plavix, see list of "Blood Thinners"), or if you have an active infection going on, you should not have the procedure.  If you are taking any blood thinners, please inform your physician.  How should I prepare for this procedure?  Do not eat or drink anything at least six hours prior to the procedure.  Bring a driver with you .  It cannot be a taxi.  Come accompanied by an adult that can drive you back, and that is strong enough to help you if your legs get weak or numb from the local anesthetic.  Take all of your medicines the morning of the procedure with just enough water to swallow them.  If you have diabetes, make sure that you are scheduled to have your procedure done first thing in the morning, whenever possible.  If you have diabetes, take only half of your insulin dose and notify our nurse that you have done so as soon as you arrive at the clinic.  If you are diabetic, but only take blood sugar pills (oral hypoglycemic), then do  not take them on the morning of your procedure.  You may take them after you have had the procedure.  Do not take aspirin or any aspirin-containing medications, at least eleven (11) days prior to the procedure.  They may prolong bleeding.  Wear loose fitting clothing that may be easy to take off and that you would not mind if it got stained with Betadine or blood.  Do not wear any jewelry or perfume  Remove any nail coloring.  It will interfere with some of our monitoring equipment.  NOTE: Remember that this is not meant to be interpreted as a complete list of all possible complications.  Unforeseen problems may occur.  BLOOD THINNERS The following drugs contain aspirin or other products, which can cause increased bleeding during surgery and should not be  taken for 2 weeks prior to and 1 week after surgery.  If you should need take something for relief of minor pain, you may take acetaminophen which is found in Tylenol,m Datril, Anacin-3 and Panadol. It is not blood thinner. The products listed below are.  Do not take any of the products listed below in addition to any listed on your instruction sheet.  A.P.C or A.P.C with Codeine Codeine Phosphate Capsules #3 Ibuprofen Ridaura  ABC compound Congesprin Imuran rimadil  Advil Cope Indocin Robaxisal  Alka-Seltzer Effervescent Pain Reliever and Antacid Coricidin or Coricidin-D  Indomethacin Rufen  Alka-Seltzer plus Cold Medicine Cosprin Ketoprofen S-A-C Tablets  Anacin Analgesic Tablets or Capsules Coumadin Korlgesic Salflex  Anacin Extra Strength Analgesic tablets or capsules CP-2 Tablets Lanoril Salicylate  Anaprox Cuprimine Capsules Levenox Salocol  Anexsia-D Dalteparin Magan Salsalate  Anodynos Darvon compound Magnesium Salicylate Sine-off  Ansaid Dasin Capsules Magsal Sodium Salicylate  Anturane Depen Capsules Marnal Soma  APF Arthritis pain formula Dewitt's Pills Measurin Stanback  Argesic Dia-Gesic Meclofenamic Sulfinpyrazone   Arthritis Bayer Timed Release Aspirin Diclofenac Meclomen Sulindac  Arthritis pain formula Anacin Dicumarol Medipren Supac  Analgesic (Safety coated) Arthralgen Diffunasal Mefanamic Suprofen  Arthritis Strength Bufferin Dihydrocodeine Mepro Compound Suprol  Arthropan liquid Dopirydamole Methcarbomol with Aspirin Synalgos  ASA tablets/Enseals Disalcid Micrainin Tagament  Ascriptin Doan's Midol Talwin  Ascriptin A/D Dolene Mobidin Tanderil  Ascriptin Extra Strength Dolobid Moblgesic Ticlid  Ascriptin with Codeine Doloprin or Doloprin with Codeine Momentum Tolectin  Asperbuf Duoprin Mono-gesic Trendar  Aspergum Duradyne Motrin or Motrin IB Triminicin  Aspirin plain, buffered or enteric coated Durasal Myochrisine Trigesic  Aspirin Suppositories Easprin Nalfon Trillsate  Aspirin with Codeine Ecotrin Regular or Extra Strength Naprosyn Uracel  Atromid-S Efficin Naproxen Ursinus  Auranofin Capsules Elmiron Neocylate Vanquish  Axotal Emagrin Norgesic Verin  Azathioprine Empirin or Empirin with Codeine Normiflo Vitamin E  Azolid Emprazil Nuprin Voltaren  Bayer Aspirin plain, buffered or children's or timed BC Tablets or powders Encaprin Orgaran Warfarin Sodium  Buff-a-Comp Enoxaparin Orudis Zorpin  Buff-a-Comp with Codeine Equegesic Os-Cal-Gesic   Buffaprin Excedrin plain, buffered or Extra Strength Oxalid   Bufferin Arthritis Strength Feldene Oxphenbutazone   Bufferin plain or Extra Strength Feldene Capsules Oxycodone with Aspirin   Bufferin with Codeine Fenoprofen Fenoprofen Pabalate or Pabalate-SF   Buffets II Flogesic Panagesic   Buffinol plain or Extra Strength Florinal or Florinal with Codeine Panwarfarin   Buf-Tabs Flurbiprofen Penicillamine   Butalbital Compound Four-way cold tablets Penicillin   Butazolidin Fragmin Pepto-Bismol   Carbenicillin Geminisyn Percodan   Carna Arthritis Reliever Geopen Persantine   Carprofen Gold's salt Persistin   Chloramphenicol Goody's Phenylbutazone    Chloromycetin Haltrain Piroxlcam   Clmetidine heparin Plaquenil   Cllnoril Hyco-pap Ponstel   Clofibrate Hydroxy chloroquine Propoxyphen         Before stopping any of these medications, be sure to consult the physician who ordered them.  Some, such as Coumadin (Warfarin) are ordered to prevent or treat serious conditions such as "deep thrombosis", "pumonary embolisms", and other heart problems.  The amount of time that you may need off of the medication may also vary with the medication and the reason for which you were taking it.  If you are taking any of these medications, please make sure you notify your pain physician before you undergo any procedures.         Selective Nerve Root Block Patient Information  Description: Specific nerve roots exit the spinal canal and these nerves can be compressed and  inflamed by a bulging disc and bone spurs.  By injecting steroids on the nerve root, we can potentially decrease the inflammation surrounding these nerves, which often leads to decreased pain.  Also, by injecting local anesthesia on the nerve root, this can provide [place] helpful information to give to your referring doctor if it decreases your pain.  Selective nerve root blocks can be done along the spine from the neck to the low back depending on the location of your pain.   After numbing the skin with local anesthesia, a small needle is passed to the nerve root and the position of the needle is verified using x-ray pictures.  After the needle is in correct position, we then deposit the medication.  You may experience a pressure sensation while this is being done.  The entire block usually lasts less than 15 minutes.  Conditions that may be treated with selective nerve root blocks:  Low back and leg pain  Spinal stenosis  Diagnostic block prior to potential surgery  Neck and arm pain  Post laminectomy syndrome  Preparation for the injection:  1. Do not eat any solid food or dairy  products within 6 hours of your appointment. 2. You may drink clear liquids up to 2 hours before an appointment.  Clear liquids include water, black coffee, juice or soda.  No milk or cream please. 3. You may take your regular medications, including pain medications, with a sip of water before your appointment.  Diabetics should hold regular insulin (if taken separately) and take 1/2 normal NPH dose the morning of the procedure.  Carry some sugar containing items with you to your appointment. 4. A driver must accompany you and be prepared to drive you home after your procedure. 5. Bring all your current medications with you. 6. An IV may be inserted and sedation may be given at the discretion of the physician. 7. A blood pressure cuff, EKG, and other monitors will often be applied during the procedure.  Some patients may need to have extra oxygen administered for a short period. 8. You will be asked to provide medical information, including allergies, prior to the procedure.  We must know immediately if you are taking blood  Thinners (like Coumadin) or if you are allergic to IV iodine contrast (dye).  Possible side-effects: All are usually temporary  Bleeding from needle site  Light headedness  Numbness and tingling  Decreased blood pressure  Weakness in arms/legs  Pressure sensation in back/neck  Pain at injection site (several days)  Possible complications: All are extremely rare  Infection  Nerve injury  Spinal headache (a headache wore with upright position)  Call if you experience:  Fever/chills associated with headache or increased back/neck pain  Headache worsened by an upright position  New onset weakness or numbness of an extremity below the injection site  Hives or difficulty breathing (go to the emergency room)  Inflammation or drainage at the injection site(s)  Severe back/neck pain greater than usual  New symptoms which are concerning to you  Please  note:  Although the local anesthetic injected can often make your back or neck feel good for several hours after the injection the pain will likely return.  It takes 3-5 days for steroids to work on the nerve root. You may not notice any pain relief for at least one week.  If effective, we will often do a series of 3 injections spaced 3-6 weeks apart to maximally decrease your pain.  If you have any questions, please call [phone] [facility]

## 2015-10-04 ENCOUNTER — Encounter: Payer: Self-pay | Admitting: Pain Medicine

## 2015-10-04 ENCOUNTER — Ambulatory Visit: Payer: PPO | Attending: Pain Medicine | Admitting: Pain Medicine

## 2015-10-04 DIAGNOSIS — M5137 Other intervertebral disc degeneration, lumbosacral region: Secondary | ICD-10-CM

## 2015-10-04 DIAGNOSIS — M47816 Spondylosis without myelopathy or radiculopathy, lumbar region: Secondary | ICD-10-CM | POA: Diagnosis not present

## 2015-10-04 DIAGNOSIS — M5136 Other intervertebral disc degeneration, lumbar region: Secondary | ICD-10-CM | POA: Insufficient documentation

## 2015-10-04 DIAGNOSIS — M545 Low back pain, unspecified: Secondary | ICD-10-CM

## 2015-10-04 DIAGNOSIS — M79605 Pain in left leg: Secondary | ICD-10-CM | POA: Diagnosis present

## 2015-10-04 DIAGNOSIS — M5481 Occipital neuralgia: Secondary | ICD-10-CM

## 2015-10-04 DIAGNOSIS — G90523 Complex regional pain syndrome I of lower limb, bilateral: Secondary | ICD-10-CM

## 2015-10-04 DIAGNOSIS — G894 Chronic pain syndrome: Secondary | ICD-10-CM

## 2015-10-04 DIAGNOSIS — M5416 Radiculopathy, lumbar region: Secondary | ICD-10-CM

## 2015-10-04 DIAGNOSIS — G8929 Other chronic pain: Secondary | ICD-10-CM

## 2015-10-04 DIAGNOSIS — M5030 Other cervical disc degeneration, unspecified cervical region: Secondary | ICD-10-CM

## 2015-10-04 DIAGNOSIS — G90521 Complex regional pain syndrome I of right lower limb: Secondary | ICD-10-CM

## 2015-10-04 DIAGNOSIS — M79604 Pain in right leg: Secondary | ICD-10-CM | POA: Diagnosis present

## 2015-10-04 DIAGNOSIS — M533 Sacrococcygeal disorders, not elsewhere classified: Secondary | ICD-10-CM

## 2015-10-04 MED ORDER — TRIAMCINOLONE ACETONIDE 40 MG/ML IJ SUSP: 40.0000 mg | Freq: Once | INTRAMUSCULAR | Status: DC

## 2015-10-04 MED ORDER — BUPIVACAINE HCL (PF) 0.25 % IJ SOLN
INTRAMUSCULAR | Status: AC
  Administered 2015-10-04: 14:00:00
  Filled 2015-10-04: qty 30

## 2015-10-04 MED ORDER — MIDAZOLAM HCL 5 MG/5ML IJ SOLN
INTRAMUSCULAR | Status: AC
  Administered 2015-10-04: 5 mg via INTRAVENOUS
  Filled 2015-10-04: qty 5

## 2015-10-04 MED ORDER — BUPIVACAINE HCL (PF) 0.25 % IJ SOLN: 30.0000 mL | Freq: Once | INTRAMUSCULAR | Status: DC

## 2015-10-04 MED ORDER — FENTANYL CITRATE (PF) 100 MCG/2ML IJ SOLN
INTRAMUSCULAR | Status: AC
  Administered 2015-10-04: 100 ug via INTRAVENOUS
  Filled 2015-10-04: qty 2

## 2015-10-04 MED ORDER — MIDAZOLAM HCL 5 MG/5ML IJ SOLN: 5.0000 mg | Freq: Once | INTRAMUSCULAR | Status: DC

## 2015-10-04 MED ORDER — LACTATED RINGERS IV SOLN: 1000.0000 mL | INTRAVENOUS | Status: DC

## 2015-10-04 MED ORDER — TRIAMCINOLONE ACETONIDE 40 MG/ML IJ SUSP
INTRAMUSCULAR | Status: AC
  Administered 2015-10-04: 14:00:00
  Filled 2015-10-04: qty 1

## 2015-10-04 MED ORDER — FENTANYL CITRATE (PF) 100 MCG/2ML IJ SOLN: 100.0000 ug | Freq: Once | INTRAMUSCULAR | Status: DC

## 2015-10-04 NOTE — Progress Notes (Signed)
Safety precautions to be maintained throughout the outpatient stay will include: orient to surroundings, keep bed in low position, maintain call bell within reach at all times, provide assistance with transfer out of bed and ambulation.  

## 2015-10-04 NOTE — Patient Instructions (Addendum)
Plan    Continue present medication hydrocodone acetaminophen and Zanflex  F/U PCP Dr. Carlis Abbott for evaliation of  BP and general medical  condition  F/U surgical evaluation as discussed May consider surgical evaluation  F/U neurological evaluation. May consider pending follow-up evaluations  May consider radiofrequency rhizolysis or intraspinal procedures pending response to present treatment and F/U evaluation   Patient to call Pain Management Center should patient have concerns prior to scheduled return appointmentSelective Nerve Root Block Patient Information  Description: Specific nerve roots exit the spinal canal and these nerves can be compressed and inflamed by a bulging disc and bone spurs.  By injecting steroids on the nerve root, we can potentially decrease the inflammation surrounding these nerves, which often leads to decreased pain.  Also, by injecting local anesthesia on the nerve root, this can provide [place] helpful information to give to your referring doctor if it decreases your pain.  Selective nerve root blocks can be done along the spine from the neck to the low back depending on the location of your pain.   After numbing the skin with local anesthesia, a small needle is passed to the nerve root and the position of the needle is verified using x-ray pictures.  After the needle is in correct position, we then deposit the medication.  You may experience a pressure sensation while this is being done.  The entire block usually lasts less than 15 minutes.  Conditions that may be treated with selective nerve root blocks:  Low back and leg pain  Spinal stenosis  Diagnostic block prior to potential surgery  Neck and arm pain  Post laminectomy syndrome  Preparation for the injection:  1. Do not eat any solid food or dairy products within 6 hours of your appointment. 2. You may drink clear liquids up to 2 hours before an appointment.  Clear liquids include water, black coffee,  juice or soda.  No milk or cream please. 3. You may take your regular medications, including pain medications, with a sip of water before your appointment.  Diabetics should hold regular insulin (if taken separately) and take 1/2 normal NPH dose the morning of the procedure.  Carry some sugar containing items with you to your appointment. 4. A driver must accompany you and be prepared to drive you home after your procedure. 5. Bring all your current medications with you. 6. An IV may be inserted and sedation may be given at the discretion of the physician. 7. A blood pressure cuff, EKG, and other monitors will often be applied during the procedure.  Some patients may need to have extra oxygen administered for a short period. 8. You will be asked to provide medical information, including allergies, prior to the procedure.  We must know immediately if you are taking blood  Thinners (like Coumadin) or if you are allergic to IV iodine contrast (dye).  Possible side-effects: All are usually temporary  Bleeding from needle site  Light headedness  Numbness and tingling  Decreased blood pressure  Weakness in arms/legs  Pressure sensation in back/neck  Pain at injection site (several days)  Possible complications: All are extremely rare  Infection  Nerve injury  Spinal headache (a headache wore with upright position)  Call if you experience:  Fever/chills associated with headache or increased back/neck pain  Headache worsened by an upright position  New onset weakness or numbness of an extremity below the injection site  Hives or difficulty breathing (go to the emergency room)  Inflammation or  drainage at the injection site(s)  Severe back/neck pain greater than usual  New symptoms which are concerning to you  Please note:  Although the local anesthetic injected can often make your back or neck feel good for several hours after the injection the pain will likely return.   It takes 3-5 days for steroids to work on the nerve root. You may not notice any pain relief for at least one week.  If effective, we will often do a series of 3 injections spaced 3-6 weeks apart to maximally decrease your pain.    If you have any questions, please call [phone] [facility] Discharge Inst[facility]  General Discharge Instructions :  If you need to reach your doctor call: Monday-Friday 8:00 am - 4:00 pm at [phone] or toll free [phone].  After clinic hours [phone] to have operator reach doctor.  Bring all of your medication bottles to all your appointments in the pain clinic.  To cancel or reschedule your appointment with Pain Management please remember to call 24 hours in advance to avoid a fee.  Refer to the educational materials which you have been given on: General Risks, I had my Procedure. Discharge Instructions, Post Sedation.  Post Procedure Instructions:  The drugs you were given will stay in your system until tomorrow, so for the next 24 hours you should not drive, make any legal decisions or drink any alcoholic beverages.  You may eat anything you prefer, but it is better to start with liquids then soups and crackers, and gradually work up to solid foods.  Please notify your doctor immediately if you have any unusual bleeding, trouble breathing or pain that is not related to your normal pain.  Depending on the type of procedure that was done, some parts of your body may feel week and/or numb.  This usually clears up by tonight or the next day.  Walk with the use of an assistive device or accompanied by an adult for the 24 hours.  You may use ice on the affected area for the first 24 hours.  Put ice in a Ziploc bag and cover with a towel and place against area 15 minutes on 15 minutes off.  You may switch to heat after 24 hours.

## 2015-10-04 NOTE — Progress Notes (Signed)
Subjective:    Patient ID: Karen Edwards, female    DOB: April 14, 1966, 49 y.o.   MRN: [id number]  HPI PROCEDURE PERFORMED: Lumbosacral selective nerve root block   NOTE: The patient is a 49 y.o. female who returns to [place] for further evaluation and treatment of pain involving the lumbar and lower extremity region. Studies consisting of MRI has revealed the patient to be with evidence of Degenerative disc disease lumbar spine Degenerative changes lumbar spine L1-2 level degenerative disc disease with right central disc extrusion cranial migration of disc material. There is concern regarding intraspinal abnormalities contributing to the patient's symptomatology. There is concern regarding patient being with component of lumbar radiculopathy The risks, benefits, and expectations of the procedure have been explained to the patient who was understanding and in agreement with suggested treatment plan. We will proceed with interventional treatment as discussed and as explained to the patient. The patient is understanding and in agreement with suggested treatment plan.   DESCRIPTION OF PROCEDURE: Lumbosacral selective nerve root block with IV Versed, IV fentanyl conscious sedation, EKG, blood pressure, pulse, and pulse oximetry monitoring. The procedure was performed with the patient in the prone position under fluoroscopic guidance. With the patient in the prone position, Betadine prep of proposed entry site was performed. Local anesthetic skin wheal of proposed needle entry site was prepared with 1.5% plain lidocaine with AP view of the lumbosacral spine.   PROCEDURE #1: Needle placement at the left L 2 vertebral body: A 22 -gauge needle was inserted at the inferior border of the transverse process of the vertebral body with needle placed medial to the midline of the transverse process on AP view of the lumbosacral spine.   NEEDLE PLACEMENT AT  L3, L4, and L5  VERTEBRAL BODY LEVELS   Needle  placement was accomplished at L3, L4, and L5  vertebral body levels on the left side exactly as was accomplished at the L2  vertebral body level  and utilizing the same technique and under fluoroscopic guidance.    Needle placement was then verified on lateral view at all levels with needle tip documented to be in the posterior superior quadrant of the intervertebral foramen of  L 2, L3, L4, and L5. Following negative aspiration for heme and CSF at each level, each level was injected with 3 mL of 0.25% bupivacaine with Kenalog.   LUMBOSACRAL SELECTIVE NERVE ROOT BLOCKS THE THE  RIGHT SIDE  The procedure was performed on the right side exactly as was performed on the left side and at the same levels  Under fluoroscopic guidance and utilizing the same technique.    The patient tolerated the procedure well. A total of 10 mg of Kenalog was utilized for the procedure.   PLAN:  1. Medications: Will continue presently prescribed medications. Zanaflex and hydrocodone acetaminophen 2. The patient is to undergo follow-up evaluation with PCP Loletta Specter for evaluation of blood pressure and general medical condition status post procedure performed on today's visit. 3. Surgical follow-up evaluation. Surgical evaluation has been addressed 4. Neurological evaluation. May consider [facility] EMG studies 5. May consider radiofrequency procedures, implantation type procedures and other treatment pending response to treatment and follow-up evaluation. 6. The patient has been advise do adhere to proper body mechanics and avoid activities which may aggravate condition. 7. The patient has been advised to call the [facility] prior to scheduled return appointment should there be significant change in the patient's condition or should the patient have other  concerns regarding condition prior to scheduled return appointment.    Review of Systems     Objective:   Physical Exam        Assessment &  Plan:

## 2015-10-05 ENCOUNTER — Telehealth: Payer: Self-pay | Admitting: *Deleted

## 2015-10-05 NOTE — Telephone Encounter (Signed)
No problems post procedure. 

## 2015-10-06 ENCOUNTER — Other Ambulatory Visit: Payer: Self-pay | Admitting: Pain Medicine

## 2015-10-13 ENCOUNTER — Ambulatory Visit (INDEPENDENT_AMBULATORY_CARE_PROVIDER_SITE_OTHER): Payer: PPO | Admitting: Primary Care

## 2015-10-13 ENCOUNTER — Encounter: Payer: Self-pay | Admitting: Primary Care

## 2015-10-13 VITALS — BP 116/72 | HR 87 | Temp 98.7°F | Ht 60.0 in | Wt 146.4 lb

## 2015-10-13 DIAGNOSIS — J029 Acute pharyngitis, unspecified: Secondary | ICD-10-CM

## 2015-10-13 LAB — POCT RAPID STREP A (OFFICE): RAPID STREP A SCREEN: NEGATIVE

## 2015-10-13 MED ORDER — AMOXICILLIN 500 MG PO CAPS: 500.0000 mg | ORAL_CAPSULE | Freq: Two times a day (BID) | ORAL | Status: DC

## 2015-10-13 NOTE — Progress Notes (Signed)
Pre visit review using our clinic review tool, if applicable. No additional management support is needed unless otherwise documented below in the visit note. 

## 2015-10-13 NOTE — Patient Instructions (Signed)
Start amoxicillin antibiotics. Take 1 tablet by mouth twice daily for 7 days.  You may take ibuprofen 600 mg three times daily as needed for sore throat. Also try warm salt gargles three times daily.   Increase consumption of fluids and rest.  It was a pleasure to see you today!  Pharyngitis Pharyngitis is redness, pain, and swelling (inflammation) of your pharynx.  CAUSES  Pharyngitis is usually caused by infection. Most of the time, these infections are from viruses (viral) and are part of a cold. However, sometimes pharyngitis is caused by bacteria (bacterial). Pharyngitis can also be caused by allergies. Viral pharyngitis may be spread from person to person by coughing, sneezing, and personal items or utensils (cups, forks, spoons, toothbrushes). Bacterial pharyngitis may be spread from person to person by more intimate contact, such as kissing.  SIGNS AND SYMPTOMS  Symptoms of pharyngitis include:   Sore throat.   Tiredness (fatigue).   Low-grade fever.   Headache.  Joint pain and muscle aches.  Skin rashes.  Swollen lymph nodes.  Plaque-like film on throat or tonsils (often seen with bacterial pharyngitis). DIAGNOSIS  Your health care provider will ask you questions about your illness and your symptoms. Your medical history, along with a physical exam, is often all that is needed to diagnose pharyngitis. Sometimes, a rapid strep test is done. Other lab tests may also be done, depending on the suspected cause.  TREATMENT  Viral pharyngitis will usually get better in 3-4 days without the use of medicine. Bacterial pharyngitis is treated with medicines that kill germs (antibiotics).  HOME CARE INSTRUCTIONS   Drink enough water and fluids to keep your urine clear or pale yellow.   Only take over-the-counter or prescription medicines as directed by your health care provider:   If you are prescribed antibiotics, make sure you finish them even if you start to feel better.    Do not take aspirin.   Get lots of rest.   Gargle with 8 oz of salt water ( tsp of salt per 1 qt of water) as often as every 1-2 hours to soothe your throat.   Throat lozenges (if you are not at risk for choking) or sprays may be used to soothe your throat. SEEK MEDICAL CARE IF:   You have large, tender lumps in your neck.  You have a rash.  You cough up green, yellow-brown, or bloody spit. SEEK IMMEDIATE MEDICAL CARE IF:   Your neck becomes stiff.  You drool or are unable to swallow liquids.  You vomit or are unable to keep medicines or liquids down.  You have severe pain that does not go away with the use of recommended medicines.  You have trouble breathing (not caused by a stuffy nose). MAKE SURE YOU:   Understand these instructions.  Will watch your condition.  Will get help right away if you are not doing well or get worse.   This information is not intended to replace advice given to you by your health care provider. Make sure you discuss any questions you have with your health care provider.   Document Released: 10/14/2005 Document Revised: 08/04/2013 Document Reviewed: 06/21/2013 Elsevier Interactive Patient Education [employer].

## 2015-10-13 NOTE — Addendum Note (Signed)
Addended by: Jacqualin Combes on: 10/13/2015 03:41 PM   Modules accepted: Orders

## 2015-10-13 NOTE — Progress Notes (Signed)
Subjective:    Patient ID: Karen Edwards, female    DOB: 06/27/1966, 49 y.o.   MRN: [id number]  HPI  Karen Edwards is a 49 year old female who presents today with a chief complaint of sore throat. She also reports ear fullness, fatigue, headache, cough. She's also noticed white spots to her posterior pharynx 2 days ago. Her symptoms have been present for 1 week. Low grade fever last night. She's been around her granddaughter who was recently ill, unsure of her diagnosis.  Review of Systems  Constitutional: Positive for fever, chills and fatigue.  HENT: Positive for sore throat. Negative for congestion and ear pain.   Respiratory: Positive for cough. Negative for shortness of breath.   Cardiovascular: Negative for chest pain.  Gastrointestinal: Positive for nausea.  Musculoskeletal: Positive for myalgias.       Past Medical History  Diagnosis Date  . MVP (mitral valve prolapse)   . Depression   . Chronic back pain   . Hypotension   . IBS (irritable bowel syndrome) 05/2015    Social History   Social History  . Marital Status: Married    Spouse Name: N/A  . Number of Children: N/A  . Years of Education: N/A   Occupational History  . Not on file.   Social History Main Topics  . Smoking status: Current Every Day Smoker -- 0.50 packs/day    Types: Cigarettes  . Smokeless tobacco: Not on file  . Alcohol Use: 0.6 oz/week    1 Cans of beer, 0 Standard drinks or equivalent per week     Comment: drinks an occasional beer  . Drug Use: No  . Sexual Activity: Yes   Other Topics Concern  . Not on file   Social History Narrative   Married.   2 children. One boy and one girl.   On disability.   Playing with her dogs, gardening.       Past Surgical History  Procedure Laterality Date  . Hysteroscopy    . Dilation and curettage of uterus    . Cesarean section      x 2  . Other surgical history Right 2015    ankle  . Ankle surgery Right 2015  . Hemorrhoid surgery N/A  06/16/2015    Procedure: HEMORRHOIDECTOMY;  Surgeon: Leonie Green, MD;  Location: [facility] ORS;  Service: General;  Laterality: N/A;    Family History  Problem Relation Age of Onset  . Arthritis Mother   . Depression Mother   . Hyperlipidemia Mother   . Hypertension Mother   . Kidney disease Mother   . Mental illness Mother   . Vision loss Mother   . Varicose Veins Mother   . Hearing loss Father   . Hyperlipidemia Father   . Hypertension Father     Allergies  Allergen Reactions  . No Known Allergies     Current Outpatient Prescriptions on File Prior to Visit  Medication Sig Dispense Refill  . FLUoxetine (PROZAC) 20 MG capsule Take 1 capsule (20 mg total) by mouth 3 (three) times daily. 90 capsule 5  . HYDROcodone-acetaminophen (NORCO/VICODIN) 5-325 MG tablet Limit 1 tab by mouth or twice per day if tolerated 60 tablet 0  . Linaclotide (LINZESS) 145 MCG CAPS capsule Take by mouth.    . medroxyPROGESTERone (PROVERA) 10 MG tablet Take by mouth.    . tizanidine (ZANAFLEX) 2 MG capsule Take 1- 3  tablets by mouth per day if tolerated 80 capsule 0  .  traZODone (DESYREL) 100 MG tablet Take 2 tablets (200 mg total) by mouth at bedtime. 60 tablet 5  . vitamin B-12 (CYANOCOBALAMIN) 50 MCG tablet Take 50 mcg by mouth every morning.      Current Facility-Administered Medications on File Prior to Visit  Medication Dose Route Frequency Provider Last Rate Last Dose  . bupivacaine (PF) (MARCAINE) 0.25 % injection 30 mL  30 mL Other Once Mohammed Kindle, MD      . fentaNYL (SUBLIMAZE) injection 100 mcg  100 mcg Intravenous Once Mohammed Kindle, MD      . lactated ringers infusion 1,000 mL  1,000 mL Intravenous Continuous Mohammed Kindle, MD      . lactated ringers infusion 1,000 mL  1,000 mL Intravenous Continuous Mohammed Kindle, MD      . lidocaine (PF) (XYLOCAINE) 1 % injection 10 mL  10 mL Subcutaneous Once Mohammed Kindle, MD      . midazolam (VERSED) 5 MG/5ML injection 5 mg  5 mg Intravenous  Once Mohammed Kindle, MD      . triamcinolone acetonide (KENALOG-40) injection 40 mg  40 mg Other Once Mohammed Kindle, MD        BP 116/72 mmHg  Pulse 87  Temp(Src) 98.7 F (37.1 C) (Oral)  Ht 5' (1.524 m)  Wt 146 lb 6.4 oz (66.407 kg)  BMI 28.59 kg/m2  SpO2 98%    Objective:   Physical Exam  Constitutional: She appears well-nourished.  HENT:  Right Ear: Tympanic membrane and ear canal normal.  Left Ear: Tympanic membrane and ear canal normal.  Nose: Right sinus exhibits no maxillary sinus tenderness and no frontal sinus tenderness. Left sinus exhibits no maxillary sinus tenderness and no frontal sinus tenderness.  Mouth/Throat: Posterior oropharyngeal edema and posterior oropharyngeal erythema present. No oropharyngeal exudate.  Eyes: Conjunctivae are normal. Pupils are equal, round, and reactive to light.  Cardiovascular: Normal rate and regular rhythm.   Pulmonary/Chest: Effort normal and breath sounds normal.  Lymphadenopathy:    She has cervical adenopathy.  Skin: Skin is warm and dry.  Vitals reviewed.         Assessment & Plan:  Sore throat:  Sore throat x 1 week, now with cough, low grade fever last night. Noticed "white spots" to throat 2 days ago. Recently around sick granddaughter. Exam with erythema and edema to posterior pharynx, no exudate. Rapid Strep: Negative Due to exam and duration of symptoms will treat for bacterial pharyngitis. Amoxicillin course sent to pharmacy. Warm salt gargles, ibuprofen, fluids, rest. Follow up PRN.

## 2015-10-16 ENCOUNTER — Ambulatory Visit
Admission: RE | Admit: 2015-10-16 | Discharge: 2015-10-16 | Disposition: A | Discharge status: Home / Self Care | Payer: PPO | Source: Ambulatory Visit | Attending: Pain Medicine | Admitting: Pain Medicine

## 2015-10-16 ENCOUNTER — Telehealth: Payer: Self-pay | Admitting: *Deleted

## 2015-10-16 DIAGNOSIS — M533 Sacrococcygeal disorders, not elsewhere classified: Secondary | ICD-10-CM

## 2015-10-16 DIAGNOSIS — M545 Low back pain, unspecified: Secondary | ICD-10-CM

## 2015-10-16 DIAGNOSIS — M5030 Other cervical disc degeneration, unspecified cervical region: Secondary | ICD-10-CM

## 2015-10-16 DIAGNOSIS — M5416 Radiculopathy, lumbar region: Secondary | ICD-10-CM

## 2015-10-16 DIAGNOSIS — M5137 Other intervertebral disc degeneration, lumbosacral region: Secondary | ICD-10-CM

## 2015-10-16 DIAGNOSIS — M50223 Other cervical disc displacement at C6-C7 level: Secondary | ICD-10-CM | POA: Insufficient documentation

## 2015-10-16 DIAGNOSIS — M79643 Pain in unspecified hand: Secondary | ICD-10-CM | POA: Diagnosis present

## 2015-10-16 DIAGNOSIS — M4802 Spinal stenosis, cervical region: Secondary | ICD-10-CM | POA: Diagnosis not present

## 2015-10-16 DIAGNOSIS — G894 Chronic pain syndrome: Secondary | ICD-10-CM

## 2015-10-16 DIAGNOSIS — M47812 Spondylosis without myelopathy or radiculopathy, cervical region: Secondary | ICD-10-CM | POA: Diagnosis not present

## 2015-10-16 DIAGNOSIS — G8929 Other chronic pain: Secondary | ICD-10-CM

## 2015-10-16 DIAGNOSIS — M5481 Occipital neuralgia: Secondary | ICD-10-CM

## 2015-10-16 DIAGNOSIS — M5136 Other intervertebral disc degeneration, lumbar region: Secondary | ICD-10-CM

## 2015-10-16 DIAGNOSIS — M50221 Other cervical disc displacement at C4-C5 level: Secondary | ICD-10-CM | POA: Diagnosis not present

## 2015-10-16 DIAGNOSIS — M47816 Spondylosis without myelopathy or radiculopathy, lumbar region: Secondary | ICD-10-CM

## 2015-10-16 DIAGNOSIS — G90523 Complex regional pain syndrome I of lower limb, bilateral: Secondary | ICD-10-CM

## 2015-10-16 DIAGNOSIS — M542 Cervicalgia: Secondary | ICD-10-CM | POA: Diagnosis present

## 2015-10-16 DIAGNOSIS — G90521 Complex regional pain syndrome I of right lower limb: Secondary | ICD-10-CM

## 2015-10-17 NOTE — Telephone Encounter (Signed)
Called patient to give her Cervical MRI findings; per Dr Primus Bravo, there are changes and he would like her to have neurosurgeon evaluation.  He will discuss this with her at her next appointment, which patient states is scheduled for tomorrow.

## 2015-10-18 ENCOUNTER — Encounter: Payer: Self-pay | Admitting: Pain Medicine

## 2015-10-18 ENCOUNTER — Ambulatory Visit: Payer: PPO | Attending: Pain Medicine | Admitting: Pain Medicine

## 2015-10-18 VITALS — BP 112/52 | HR 92 | Temp 98.7°F | Resp 18 | Ht 60.0 in | Wt 135.0 lb

## 2015-10-18 DIAGNOSIS — M5481 Occipital neuralgia: Secondary | ICD-10-CM | POA: Diagnosis not present

## 2015-10-18 DIAGNOSIS — G894 Chronic pain syndrome: Secondary | ICD-10-CM

## 2015-10-18 DIAGNOSIS — M47816 Spondylosis without myelopathy or radiculopathy, lumbar region: Secondary | ICD-10-CM

## 2015-10-18 DIAGNOSIS — M4802 Spinal stenosis, cervical region: Secondary | ICD-10-CM | POA: Insufficient documentation

## 2015-10-18 DIAGNOSIS — M5136 Other intervertebral disc degeneration, lumbar region: Secondary | ICD-10-CM

## 2015-10-18 DIAGNOSIS — G90521 Complex regional pain syndrome I of right lower limb: Secondary | ICD-10-CM

## 2015-10-18 DIAGNOSIS — M79604 Pain in right leg: Secondary | ICD-10-CM | POA: Diagnosis present

## 2015-10-18 DIAGNOSIS — G8929 Other chronic pain: Secondary | ICD-10-CM

## 2015-10-18 DIAGNOSIS — M545 Low back pain, unspecified: Secondary | ICD-10-CM

## 2015-10-18 DIAGNOSIS — M5030 Other cervical disc degeneration, unspecified cervical region: Secondary | ICD-10-CM | POA: Diagnosis not present

## 2015-10-18 DIAGNOSIS — M50221 Other cervical disc displacement at C4-C5 level: Secondary | ICD-10-CM | POA: Diagnosis not present

## 2015-10-18 DIAGNOSIS — M5116 Intervertebral disc disorders with radiculopathy, lumbar region: Secondary | ICD-10-CM | POA: Diagnosis not present

## 2015-10-18 DIAGNOSIS — M533 Sacrococcygeal disorders, not elsewhere classified: Secondary | ICD-10-CM | POA: Diagnosis not present

## 2015-10-18 DIAGNOSIS — M79605 Pain in left leg: Secondary | ICD-10-CM | POA: Diagnosis present

## 2015-10-18 DIAGNOSIS — M5010 Cervical disc disorder with radiculopathy, unspecified cervical region: Secondary | ICD-10-CM

## 2015-10-18 DIAGNOSIS — G90523 Complex regional pain syndrome I of lower limb, bilateral: Secondary | ICD-10-CM

## 2015-10-18 DIAGNOSIS — M47812 Spondylosis without myelopathy or radiculopathy, cervical region: Secondary | ICD-10-CM | POA: Insufficient documentation

## 2015-10-18 DIAGNOSIS — M5137 Other intervertebral disc degeneration, lumbosacral region: Secondary | ICD-10-CM

## 2015-10-18 DIAGNOSIS — M5416 Radiculopathy, lumbar region: Secondary | ICD-10-CM

## 2015-10-18 MED ORDER — HYDROCODONE-ACETAMINOPHEN 5-325 MG PO TABS: ORAL_TABLET | ORAL | Status: DC

## 2015-10-18 MED ORDER — TIZANIDINE HCL 2 MG PO CAPS: ORAL_CAPSULE | ORAL | Status: DC

## 2015-10-18 NOTE — Progress Notes (Signed)
Safety precautions to be maintained throughout the outpatient stay will include: orient to surroundings, keep bed in low position, maintain call bell within reach at all times, provide assistance with transfer out of bed and ambulation.  

## 2015-10-18 NOTE — Patient Instructions (Addendum)
PLAN   Continue present medication hydrocodone acetaminophen and Zanaflex  Lumbosacral   Selective nerve root block next appointment  F/U PCP Dr. Carlis Abbott for evaliation of  BP and general medical  Condition  Ask receptionist date of neurosurgical appt  F/U surgical evaluation as discussed May consider surgical evaluation  F/U neurological evaluation. May consider pending follow-up evaluations  Cervical MRI. Ask Karen Edwards date of his cervical MRI  May consider radiofrequency rhizolysis or intraspinal procedures pending response to present treatment and F/U evaluation Selective Nerve Root Block Patient Information  Description: Specific nerve roots exit the spinal canal and these nerves can be compressed and inflamed by a bulging disc and bone spurs.  By injecting steroids on the nerve root, we can potentially decrease the inflammation surrounding these nerves, which often leads to decreased pain.  Also, by injecting local anesthesia on the nerve root, this can provide [place] helpful information to give to your referring doctor if it decreases your pain.  Selective nerve root blocks can be done along the spine from the neck to the low back depending on the location of your pain.   After numbing the skin with local anesthesia, a small needle is passed to the nerve root and the position of the needle is verified using x-ray pictures.  After the needle is in correct position, we then deposit the medication.  You may experience a pressure sensation while this is being done.  The entire block usually lasts less than 15 minutes.  Conditions that may be treated with selective nerve root blocks:  Low back and leg pain  Spinal stenosis  Diagnostic block prior to potential surgery  Neck and arm pain  Post laminectomy syndrome  Preparation for the injection:  1. Do not eat any solid food or dairy products within 6 hours of your appointment. 2. You may drink clear liquids up to 2 hours before an  appointment.  Clear liquids include water, black coffee, juice or soda.  No milk or cream please. 3. You may take your regular medications, including pain medications, with a sip of water before your appointment.  Diabetics should hold regular insulin (if taken separately) and take 1/2 normal NPH dose the morning of the procedure.  Carry some sugar containing items with you to your appointment. 4. A driver must accompany you and be prepared to drive you home after your procedure. 5. Bring all your current medications with you. 6. An IV may be inserted and sedation may be given at the discretion of the physician. 7. A blood pressure cuff, EKG, and other monitors will often be applied during the procedure.  Some patients may need to have extra oxygen administered for a short period. 8. You will be asked to provide medical information, including allergies, prior to the procedure.  We must know immediately if you are taking blood  Thinners (like Coumadin) or if you are allergic to IV iodine contrast (dye).  Possible side-effects: All are usually temporary  Bleeding from needle site  Light headedness  Numbness and tingling  Decreased blood pressure  Weakness in arms/legs  Pressure sensation in back/neck  Pain at injection site (several days)  Possible complications: All are extremely rare  Infection  Nerve injury  Spinal headache (a headache wore with upright position)  Call if you experience:  Fever/chills associated with headache or increased back/neck pain  Headache worsened by an upright position  New onset weakness or numbness of an extremity below the injection site  Hives or  difficulty breathing (go to the emergency room)  Inflammation or drainage at the injection site(s)  Severe back/neck pain greater than usual  New symptoms which are concerning to you  Please note:  Although the local anesthetic injected can often make your back or neck feel good for  several hours after the injection the pain will likely return.  It takes 3-5 days for steroids to work on the nerve root. You may not notice any pain relief for at least one week.  If effective, we will often do a series of 3 injections spaced 3-6 weeks apart to maximally decrease your pain.    If you have any questions, please call [phone] [facility]

## 2015-10-18 NOTE — Progress Notes (Signed)
Subjective:    Patient ID: Karen Edwards, female    DOB: [birth year], 49 y.o.   MRN: [id number]  HPI  The patient is a 49 year old female who returns to pain management for further evaluation and treatment of pain involving the lumbar and lower extremity regions as well as the neck and upper extremity region. The patient states that her lower back and lower extremity pain has improved with prior treatment in pain management Center. The patient is with some pain involving the neck associated with weakness of the upper extremities. On today's visit we reviewed patient's MRI of the cervical spine and will schedule patient for neurosurgical evaluation. We will avoid interventional treatment of the cervical region at this time as explained to patient. We will continue patient's presently prescribed medications and we'll remain available to consider modifications of treatment pending follow-up evaluation.  The present time we will proceed with lumbosacral selective nerve root block at time of return appointment in attempt to decrease severity of patient's symptoms involving the lumbar and lower extremity region. The patient was with understanding and agreed to suggested treatment plan.     Review of Systems     Objective:   Physical Exam  There was tenderness to palpation of the splenius capitis and occipitalis musculature region a moderate degree. Patient appeared to be with unremarkable Spurling's maneuver. Grip strength appeared to be slightly decreased. Tinel and Phalen's maneuver were without increased pain of significant degree. Palpation over the cervical facet cervical paraspinal musculature region as well as the     Assessmthoracic paraspinal musculature region and thoracic facet region reproduces moderate discomfort. There was tends to palpation over the thoracic paraspinal musculature region with no crepitus of the thoracic region noted. Palpation over the lumbar paraspinal must reason  lumbar facet region was associated with increased pain of moderate degree. Lateral bending rotation extension and palpation over the lumbar facets reproduce moderate discomfort. There was mild tenderness to palpation of the PSIS and PII S region as well as the gluteal and piriformis musculature regions. There was mild tenderness to palpation of the greater trochanteric region and iliotibial band region. Straight leg raising was tolerates approximately 20 without a definite increase of pain with dorsiflexion noted. There appeared to be negative clonus negative Homans. Abdomen was nontender with no costovertebral tenderness noted.ent & Plan:       ASSESSMENT Degenerative disc disease lumbar spine Degenerative changes lumbar spine L1-2 level degenerative disc disease with right central disc extrusion cranial migration of disc material  Lumbar facet syndrome  Lumbar radiculopathy  Sacroiliac joint dysfunction  Bilateral occipital neuralgia  Degenerative disc disease cervical spine C4-C5 shallow disc protrusion that indents the thecal sac. 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 C6-C7 broad-based disc herniation narrows the spinal canal but does not compress the cord shows foraminal extension. C5-C6 and C6-C7 abnormalities noted with C4-C5 changes apparently have worsened since prior study     PLAN  Continue present medication hydrocodone acetaminophen and Zanaflex  Lumbosacral   Selective nerve root block next appointment  F/U PCP Dr. Carlis Abbott for evaliation of  BP and general medical  Condition  Ask receptionist date of neurosurgical appt  F/U surgical evaluation as discussed May consider surgical evaluation  F/U neurological evaluation. May consider pending follow-up evaluations  Cervical MRI. Ask Lelon Huh date of his cervical MRI  May consider radiofrequency rhizolysis  or intraspinal procedures pending  response to present treatment and F/U evaluation

## 2015-11-01 ENCOUNTER — Encounter: Payer: Self-pay | Admitting: Pain Medicine

## 2015-11-01 ENCOUNTER — Ambulatory Visit: Payer: PPO | Attending: Pain Medicine | Admitting: Pain Medicine

## 2015-11-01 VITALS — BP 120/65 | HR 50 | Temp 98.5°F | Resp 16 | Ht 60.0 in | Wt 135.0 lb

## 2015-11-01 DIAGNOSIS — G90521 Complex regional pain syndrome I of right lower limb: Secondary | ICD-10-CM

## 2015-11-01 DIAGNOSIS — G90523 Complex regional pain syndrome I of lower limb, bilateral: Secondary | ICD-10-CM

## 2015-11-01 DIAGNOSIS — M545 Low back pain: Secondary | ICD-10-CM | POA: Insufficient documentation

## 2015-11-01 DIAGNOSIS — G894 Chronic pain syndrome: Secondary | ICD-10-CM

## 2015-11-01 DIAGNOSIS — M5416 Radiculopathy, lumbar region: Secondary | ICD-10-CM

## 2015-11-01 DIAGNOSIS — M47816 Spondylosis without myelopathy or radiculopathy, lumbar region: Secondary | ICD-10-CM | POA: Insufficient documentation

## 2015-11-01 DIAGNOSIS — M5136 Other intervertebral disc degeneration, lumbar region: Secondary | ICD-10-CM | POA: Diagnosis not present

## 2015-11-01 DIAGNOSIS — M5137 Other intervertebral disc degeneration, lumbosacral region: Secondary | ICD-10-CM

## 2015-11-01 DIAGNOSIS — M79606 Pain in leg, unspecified: Secondary | ICD-10-CM | POA: Diagnosis not present

## 2015-11-01 DIAGNOSIS — M533 Sacrococcygeal disorders, not elsewhere classified: Secondary | ICD-10-CM

## 2015-11-01 DIAGNOSIS — M5030 Other cervical disc degeneration, unspecified cervical region: Secondary | ICD-10-CM

## 2015-11-01 DIAGNOSIS — M5481 Occipital neuralgia: Secondary | ICD-10-CM

## 2015-11-01 DIAGNOSIS — M4802 Spinal stenosis, cervical region: Secondary | ICD-10-CM

## 2015-11-01 DIAGNOSIS — G8929 Other chronic pain: Secondary | ICD-10-CM

## 2015-11-01 DIAGNOSIS — M5010 Cervical disc disorder with radiculopathy, unspecified cervical region: Secondary | ICD-10-CM

## 2015-11-01 DIAGNOSIS — M5126 Other intervertebral disc displacement, lumbar region: Secondary | ICD-10-CM | POA: Diagnosis not present

## 2015-11-01 MED ORDER — TRIAMCINOLONE ACETONIDE 40 MG/ML IJ SUSP: 40.0000 mg | Freq: Once | INTRAMUSCULAR | Status: DC

## 2015-11-01 MED ORDER — FENTANYL CITRATE (PF) 100 MCG/2ML IJ SOLN
INTRAMUSCULAR | Status: AC
  Administered 2015-11-01: 100 ug via INTRAVENOUS
  Filled 2015-11-01: qty 2

## 2015-11-01 MED ORDER — TRIAMCINOLONE ACETONIDE 40 MG/ML IJ SUSP
INTRAMUSCULAR | Status: AC
  Administered 2015-11-01: 11:00:00
  Filled 2015-11-01: qty 1

## 2015-11-01 MED ORDER — LACTATED RINGERS IV SOLN: 1000.0000 mL | INTRAVENOUS | Status: DC

## 2015-11-01 MED ORDER — ORPHENADRINE CITRATE 30 MG/ML IJ SOLN: 60.0000 mg | Freq: Once | INTRAMUSCULAR | Status: DC

## 2015-11-01 MED ORDER — MIDAZOLAM HCL 5 MG/5ML IJ SOLN
INTRAMUSCULAR | Status: AC
  Administered 2015-11-01: 5 mg via INTRAVENOUS
  Filled 2015-11-01: qty 5

## 2015-11-01 MED ORDER — MIDAZOLAM HCL 5 MG/5ML IJ SOLN: 5.0000 mg | Freq: Once | INTRAMUSCULAR | Status: DC

## 2015-11-01 MED ORDER — FENTANYL CITRATE (PF) 100 MCG/2ML IJ SOLN: 100.0000 ug | Freq: Once | INTRAMUSCULAR | Status: DC

## 2015-11-01 MED ORDER — LIDOCAINE HCL (PF) 1 % IJ SOLN
INTRAMUSCULAR | Status: AC
Start: 2015-11-01 — End: 2015-11-01
  Administered 2015-11-01: 11:00:00
  Filled 2015-11-01: qty 5

## 2015-11-01 MED ORDER — BUPIVACAINE HCL (PF) 0.25 % IJ SOLN: 30.0000 mL | Freq: Once | INTRAMUSCULAR | Status: DC

## 2015-11-01 MED ORDER — BUPIVACAINE HCL (PF) 0.25 % IJ SOLN
INTRAMUSCULAR | Status: AC
  Administered 2015-11-01: 11:00:00
  Filled 2015-11-01: qty 30

## 2015-11-01 MED ORDER — LIDOCAINE HCL (PF) 1 % IJ SOLN: 10.0000 mL | Freq: Once | INTRAMUSCULAR | Status: DC

## 2015-11-01 MED ORDER — ORPHENADRINE CITRATE 30 MG/ML IJ SOLN
INTRAMUSCULAR | Status: AC
  Administered 2015-11-01: 11:00:00
  Filled 2015-11-01: qty 2

## 2015-11-01 NOTE — Progress Notes (Signed)
Safety precautions to be maintained throughout the outpatient stay will include: orient to surroundings, keep bed in low position, maintain call bell within reach at all times, provide assistance with transfer out of bed and ambulation.  

## 2015-11-01 NOTE — Progress Notes (Signed)
Subjective:    Patient ID: Karen Edwards, female    DOB: [birth year], 50 y.o.   MRN: [id number]  HPI  PROCEDURE PERFORMED: Lumbosacral selective nerve root block   NOTE: The patient is a 50 y.o. female who returns to [facility] for further evaluation and treatment of pain involving the lumbar and lower extremity region. Studies consisting of MRI has revealed the patient to be with evidence of  Degenerative disc disease lumbar spine Degenerative changes lumbar spine L1-2 level degenerative disc disease with right central disc extrusion cranial migration of disc material. There is concern regarding intraspinal abnormalities contributing to the patient's symptomatology. There is concern regarding component of lumbar radiculopathy contributing to patient's symptomatology The risks, benefits, and expectations of the procedure have been explained to the patient who was understanding and in agreement with suggested treatment plan. We will proceed with interventional treatment as discussed and as explained to the patient. The patient is understanding and in agreement with suggested treatment plan.   DESCRIPTION OF PROCEDURE: Lumbosacral selective nerve root block with IV Versed, IV fentanyl conscious sedation, EKG, blood pressure, pulse, and pulse oximetry monitoring. The procedure was performed with the patient in the prone position under fluoroscopic guidance. With the patient in the prone position, Betadine prep of proposed entry site was performed. Local anesthetic skin wheal of proposed needle entry site was prepared with 1.5% plain lidocaine with AP view of the lumbosacral spine.   PROCEDURE #1: Needle placement at the left L 2 vertebral body: A 22 -gauge needle was inserted at the inferior border of the transverse process of the vertebral body with needle placed medial to the midline of the transverse process on AP view of the lumbosacral spine.   NEEDLE PLACEMENT AT  LEFT L3, L4, and  L5  VERTEBRAL BODY LEVELS  Needle  placement was accomplished at L3, L4, and L5  vertebral body levels on the left side exactly as was accomplished at the L2  vertebral body level  and utilizing the same technique and under fluoroscopic guidance.  PROCEDURE #4: Needle placement at the S1 foramen. With the patient in the prone position with Betadine prep of proposed entry site accomplished, the S1 foramen was visualized under fluoroscopic guidance with AP view of the lumbosacral spine with cephalad orientation of the fluoroscope with local anesthetic skin wheal of 1.5% lidocaine of proposed needle entry site prepared. A 22-gauge needle was inserted S1 foramen under fluoroscopic guidance eliciting paresthesias radiating from the buttocks to the lower extremity after which needle was slightly withdrawn.   Needle placement was then verified on lateral view at all levels with needle tip documented to be in the posterior superior quadrant of the intervertebral foramen of  L 2, L3, L4, and L5. Following negative aspiration for heme and CSF at each level, each level was injected with 3 mL of 0.25% bupivacaine with Kenalog.   LUMBOSACRAL SELECTIVE NERVE ROOT BLOCKS THE THE  RIGHT SIDE  The procedure was performed on the right side exactly as was performed on the left side and at the same levels  Under fluoroscopic guidance and utilizing the same technique.  Myoneural block injections of the cervical region Following Betadine prep of proposed entry site a 22-gauge needle was inserted in the cervical paraspinal musculature region and following negative aspiration 1 cc of 0.25% bupivacaine with Norflex was injected for myoneural block injection of the cervical region 2    The patient tolerated the procedure well. A total of 10  mg of Kenalog was utilized for the procedure.   PLAN:  1. Medications: Will continue presently prescribed medications. Hydrocodone acetaminophen and Zanaflex 2. The patient is to  undergo follow-up evaluation with PCP Dr.K Carlis Abbott  for evaluation of blood pressure and general medical condition status post procedure performed on today's visit. 3. Surgical follow-up evaluation as scheduled  4. Neurological evaluation.Has been addressed  5. May consider radiofrequency procedures, implantation type procedures and other treatment pending response to treatment and follow-up evaluation. 6. The patient has been advise do adhere to proper body mechanics and avoid activities which may aggravate condition. 7. The patient has been advised to call the Pain Management Center prior to scheduled return appointment should there be significant change in the patient's condition or should the patient have other concerns regarding condition prior to scheduled return appointment.   Review of Systems     Objective:   Physical Exam        Assessment & Plan:

## 2015-11-01 NOTE — Patient Instructions (Addendum)
Plan    Continue present medication hydrocodone acetaminophen and Zanflex  F/U PCP Dr. Carlis Abbott for evaliation of  BP and general medical  condition  F/U surgical evaluation this week as discussed   F/U neurological evaluation. May consider pending follow-up evaluations  May consider radiofrequency rhizolysis or intraspinal procedures pending response to present treatment and F/U evaluation   Patient to call Pain Management Center should patient have concerns prior to scheduled return appointmentPain Management Discharge Instructions  General Discharge Instructions :  If you need to reach your doctor call: Monday-Friday 8:00 am - 4:00 pm at [phone] or toll free [phone].  After clinic hours [phone] to have operator reach doctor.  Bring all of your medication bottles to all your appointments in the pain clinic.  To cancel or reschedule your appointment with Pain Management please remember to call 24 hours in advance to avoid a fee.  Refer to the educational materials which you have been given on: General Risks, I had my Procedure. Discharge Instructions, Post Sedation.  Post Procedure Instructions:  The drugs you were given will stay in your system until tomorrow, so for the next 24 hours you should not drive, make any legal decisions or drink any alcoholic beverages.  You may eat anything you prefer, but it is better to start with liquids then soups and crackers, and gradually work up to solid foods.  Please notify your doctor immediately if you have any unusual bleeding, trouble breathing or pain that is not related to your normal pain.  Depending on the type of procedure that was done, some parts of your body may feel week and/or numb.  This usually clears up by tonight or the next day.  Walk with the use of an assistive device or accompanied by an adult for the 24 hours.  You may use ice on the affected area for the first 24 hours.  Put ice in a Ziploc bag and  cover with a towel and place against area 15 minutes on 15 minutes off.  You may switch to heat after 24 hours.

## 2015-11-02 ENCOUNTER — Telehealth: Payer: Self-pay | Admitting: *Deleted

## 2015-11-02 DIAGNOSIS — Z6829 Body mass index (BMI) 29.0-29.9, adult: Secondary | ICD-10-CM | POA: Diagnosis not present

## 2015-11-02 DIAGNOSIS — M4722 Other spondylosis with radiculopathy, cervical region: Secondary | ICD-10-CM | POA: Diagnosis not present

## 2015-11-02 NOTE — Telephone Encounter (Signed)
Spoke with patient, verbalizes no complications from procedure on yesterday. 

## 2015-11-13 ENCOUNTER — Other Ambulatory Visit: Payer: Self-pay | Admitting: Pain Medicine

## 2015-11-15 ENCOUNTER — Encounter: Payer: Self-pay | Admitting: Pain Medicine

## 2015-11-15 ENCOUNTER — Ambulatory Visit: Payer: PPO | Attending: Pain Medicine | Admitting: Pain Medicine

## 2015-11-15 VITALS — BP 120/89 | HR 85 | Temp 98.1°F | Resp 16 | Ht 60.0 in | Wt 140.0 lb

## 2015-11-15 DIAGNOSIS — G8929 Other chronic pain: Secondary | ICD-10-CM

## 2015-11-15 DIAGNOSIS — M542 Cervicalgia: Secondary | ICD-10-CM | POA: Diagnosis not present

## 2015-11-15 DIAGNOSIS — M533 Sacrococcygeal disorders, not elsewhere classified: Secondary | ICD-10-CM

## 2015-11-15 DIAGNOSIS — M5416 Radiculopathy, lumbar region: Secondary | ICD-10-CM | POA: Diagnosis not present

## 2015-11-15 DIAGNOSIS — M5116 Intervertebral disc disorders with radiculopathy, lumbar region: Secondary | ICD-10-CM | POA: Diagnosis not present

## 2015-11-15 DIAGNOSIS — M47896 Other spondylosis, lumbar region: Secondary | ICD-10-CM | POA: Insufficient documentation

## 2015-11-15 DIAGNOSIS — M5010 Cervical disc disorder with radiculopathy, unspecified cervical region: Secondary | ICD-10-CM

## 2015-11-15 DIAGNOSIS — M545 Low back pain, unspecified: Secondary | ICD-10-CM

## 2015-11-15 DIAGNOSIS — M50221 Other cervical disc displacement at C4-C5 level: Secondary | ICD-10-CM | POA: Diagnosis not present

## 2015-11-15 DIAGNOSIS — G90521 Complex regional pain syndrome I of right lower limb: Secondary | ICD-10-CM

## 2015-11-15 DIAGNOSIS — M5481 Occipital neuralgia: Secondary | ICD-10-CM | POA: Diagnosis not present

## 2015-11-15 DIAGNOSIS — M4802 Spinal stenosis, cervical region: Secondary | ICD-10-CM | POA: Diagnosis not present

## 2015-11-15 DIAGNOSIS — M5126 Other intervertebral disc displacement, lumbar region: Secondary | ICD-10-CM | POA: Diagnosis not present

## 2015-11-15 DIAGNOSIS — M5136 Other intervertebral disc degeneration, lumbar region: Secondary | ICD-10-CM

## 2015-11-15 DIAGNOSIS — M50223 Other cervical disc displacement at C6-C7 level: Secondary | ICD-10-CM | POA: Diagnosis not present

## 2015-11-15 DIAGNOSIS — M47817 Spondylosis without myelopathy or radiculopathy, lumbosacral region: Secondary | ICD-10-CM | POA: Diagnosis not present

## 2015-11-15 DIAGNOSIS — M5137 Other intervertebral disc degeneration, lumbosacral region: Secondary | ICD-10-CM

## 2015-11-15 DIAGNOSIS — G894 Chronic pain syndrome: Secondary | ICD-10-CM

## 2015-11-15 DIAGNOSIS — G90523 Complex regional pain syndrome I of lower limb, bilateral: Secondary | ICD-10-CM

## 2015-11-15 DIAGNOSIS — M47816 Spondylosis without myelopathy or radiculopathy, lumbar region: Secondary | ICD-10-CM

## 2015-11-15 DIAGNOSIS — M5030 Other cervical disc degeneration, unspecified cervical region: Secondary | ICD-10-CM

## 2015-11-15 DIAGNOSIS — M791 Myalgia: Secondary | ICD-10-CM | POA: Diagnosis not present

## 2015-11-15 MED ORDER — TIZANIDINE HCL 2 MG PO CAPS: ORAL_CAPSULE | ORAL | Status: DC

## 2015-11-15 MED ORDER — HYDROCODONE-ACETAMINOPHEN 5-325 MG PO TABS: ORAL_TABLET | ORAL | Status: DC

## 2015-11-15 NOTE — Patient Instructions (Addendum)
PLAN   Continue present medication hydrocodone acetaminophen and Zanaflex  F/U PCP Dr. Carlis Abbott for evaliation of  BP and general medical  Condition  F/U with neurosurgeon for planned surgery as discussed  F/U surgical evaluation as discussed May consider surgical evaluation  F/U neurological evaluation. May consider pending follow-up evaluations  Cervical MRI. Ask Karen Edwards date of his cervical MRI  May consider radiofrequency rhizolysis or intraspinal procedures pending response to present treatment and F/U evaluation  Patient to call pain management for any change in condition or concerns prior to scheduled appointment if necessary

## 2015-11-15 NOTE — Progress Notes (Signed)
Subjective:    Patient ID: Karen Edwards, female    DOB: [birth year], 50 y.o.   MRN: [id number]  HPI The patient is a 50 year old female returns to pain management for further evaluation and treatment of pain involving the neck upper extremity region and lower back lower extremity region. The patient is undergone neurosurgical evaluation of cervical MRI and is scheduled to undergo surgery of the cervical region in the next few weeks. We discussed patient's condition and will continue medications as prescribed at this time. We will consider patient for modification of treatment regimen pending patient's response to surgery and follow-up evaluation in pain management Center. The patient states that she has pain involving the region of the neck upper extremity region with numbness tingling sensation involving the thumb index finger and middle finger at times as well. We will collaborate with patient's surgeon regarding prescribing her medications and will provide patient with prescription of medication today and will allow patient to receive medication for postoperative pain from her surgeon as discussed. The patient agreed to suggested treatment plan.   Review of Systems     Objective:   Physical Exam There was tenderness of the splenius capitis and a separate talus musculature regions of mild to moderate degree with moderate tends to palpation cervical facet cervical paraspinal musculature region. There was limited range of motion of the cervical spine without definite Spurling's maneuver. There was tenderness over the region cervical facet cervical paraspinal must reason thoracic facet thoracic paraspinal muscles region palpation which reproduces moderate discomfort. Grip strength appeared to be decreased. Tinel and Phalen's maneuver were without increased pain significant degree. Palpation the lumbar paraspinal muscles lumbar facet region associated tensed palpation of mild to moderate degree with  lateral bending rotation extension and palpation of the lumbar facets reproducing mild to moderate discomfort. There was moderate tennis of the PSIS and PII S region as well as the gluteal and piriformis musculature region. There was tends to palpation of the greater trochanteric region iliotibial band region a mild degree. Palpation of the gluteal and piriformis muscular regions reproduce mild to moderate discomfort. Straight leg raise was tolerates approximately 20 without increased pain with dorsiflexion noted. Negative clonus negative Homans no definite sensory deficit or dermatomal distribution detected. Abdomen nontender with no costovertebral tenderness noted.       Assessment & Plan:    Degenerative changes lumbar spine L1-2 level degenerative disc disease with right central disc extrusion cranial migration of disc material  Lumbar facet syndrome  Lumbar radiculopathy  Sacroiliac joint dysfunction  Bilateral occipital neuralgia  Degenerative disc disease cervical spine C4-C5 shallow disc protrusion that indents the thecal sac. 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 C6-C7 broad-based disc herniation narrows the spinal canal but does not compress the cord shows foraminal extension. C5-C6 and C6-C7 abnormalities noted with C4-C5 changes apparently have worsened since prior study       PLAN   Continue present medication hydrocodone acetaminophen and Zanaflex  F/U PCP Dr. Carlis Abbott for evaliation of  BP and general medical  Condition  F/U with neurosurgeon for planned surgery as discussed  F/U surgical evaluation as discussed May consider surgical evaluation  F/U neurological evaluation. May consider pending follow-up evaluations  Cervical MRI. Ask Lelon Huh date of his cervical MRI  May consider radiofrequency rhizolysis or intraspinal procedures pending response  to present treatment and F/U evaluation  Patient to call pain  management for any change in condition or concerns prior to scheduled appointment if necessary

## 2015-11-15 NOTE — Progress Notes (Signed)
Safety precautions to be maintained throughout the outpatient stay will include: orient to surroundings, keep bed in low position, maintain call bell within reach at all times, provide assistance with transfer out of bed and ambulation.  

## 2015-11-16 ENCOUNTER — Telehealth: Payer: Self-pay

## 2015-11-16 ENCOUNTER — Telehealth: Payer: Self-pay | Admitting: *Deleted

## 2015-11-16 NOTE — Telephone Encounter (Signed)
Routed back to Secretaries for scheduling of appt for medication change.

## 2015-11-16 NOTE — Telephone Encounter (Signed)
Routed to Dr Primus Bravo

## 2015-11-16 NOTE — Telephone Encounter (Signed)
Thank you Cecille Rubin As discussed, patient will need evaluation for medication change. Please ask secretaries to scheduled evaluation

## 2015-11-16 NOTE — Telephone Encounter (Signed)
Pt says she can't afford tizanidine would like Dr. Primus Bravo to prescribe her another muscle relaxer that's a little more affordable

## 2015-11-16 NOTE — Telephone Encounter (Signed)
Patient states the copay for the tizanadine  you prescribed her  has gone up to 45.00 and she cannot afford that. She states she was just here yesterday. She wants to know if you can just change the muscle relaxer to a different one and call it in for her.

## 2015-11-16 NOTE — Telephone Encounter (Signed)
Secretaries: please call patient and schedule for eval appointment.

## 2015-11-16 NOTE — Telephone Encounter (Signed)
Nurses and Blanch Media As previously stated earlier today, the patient will need appointment for evaluation before medication can be changed. Please schedule patient for evaluation

## 2015-11-17 NOTE — Telephone Encounter (Signed)
Scheduled patient for eval

## 2015-11-23 ENCOUNTER — Ambulatory Visit: Payer: PPO | Attending: Pain Medicine | Admitting: Pain Medicine

## 2015-11-23 ENCOUNTER — Encounter: Payer: Self-pay | Admitting: Pain Medicine

## 2015-11-23 VITALS — BP 104/68 | HR 93 | Temp 99.1°F | Resp 16 | Ht 60.0 in | Wt 135.0 lb

## 2015-11-23 DIAGNOSIS — G90521 Complex regional pain syndrome I of right lower limb: Secondary | ICD-10-CM

## 2015-11-23 DIAGNOSIS — M5010 Cervical disc disorder with radiculopathy, unspecified cervical region: Secondary | ICD-10-CM

## 2015-11-23 DIAGNOSIS — M5416 Radiculopathy, lumbar region: Secondary | ICD-10-CM

## 2015-11-23 DIAGNOSIS — M533 Sacrococcygeal disorders, not elsewhere classified: Secondary | ICD-10-CM | POA: Diagnosis not present

## 2015-11-23 DIAGNOSIS — M4802 Spinal stenosis, cervical region: Secondary | ICD-10-CM | POA: Diagnosis not present

## 2015-11-23 DIAGNOSIS — M5137 Other intervertebral disc degeneration, lumbosacral region: Secondary | ICD-10-CM

## 2015-11-23 DIAGNOSIS — M545 Low back pain: Secondary | ICD-10-CM

## 2015-11-23 DIAGNOSIS — M50221 Other cervical disc displacement at C4-C5 level: Secondary | ICD-10-CM | POA: Insufficient documentation

## 2015-11-23 DIAGNOSIS — M5030 Other cervical disc degeneration, unspecified cervical region: Secondary | ICD-10-CM | POA: Insufficient documentation

## 2015-11-23 DIAGNOSIS — M47896 Other spondylosis, lumbar region: Secondary | ICD-10-CM | POA: Diagnosis not present

## 2015-11-23 DIAGNOSIS — G894 Chronic pain syndrome: Secondary | ICD-10-CM

## 2015-11-23 DIAGNOSIS — M5481 Occipital neuralgia: Secondary | ICD-10-CM | POA: Insufficient documentation

## 2015-11-23 DIAGNOSIS — G90523 Complex regional pain syndrome I of lower limb, bilateral: Secondary | ICD-10-CM

## 2015-11-23 DIAGNOSIS — M5126 Other intervertebral disc displacement, lumbar region: Secondary | ICD-10-CM | POA: Diagnosis not present

## 2015-11-23 DIAGNOSIS — M50223 Other cervical disc displacement at C6-C7 level: Secondary | ICD-10-CM | POA: Diagnosis not present

## 2015-11-23 DIAGNOSIS — M542 Cervicalgia: Secondary | ICD-10-CM | POA: Diagnosis not present

## 2015-11-23 DIAGNOSIS — M791 Myalgia: Secondary | ICD-10-CM | POA: Diagnosis not present

## 2015-11-23 DIAGNOSIS — M47817 Spondylosis without myelopathy or radiculopathy, lumbosacral region: Secondary | ICD-10-CM | POA: Diagnosis not present

## 2015-11-23 DIAGNOSIS — M5136 Other intervertebral disc degeneration, lumbar region: Secondary | ICD-10-CM

## 2015-11-23 DIAGNOSIS — M47816 Spondylosis without myelopathy or radiculopathy, lumbar region: Secondary | ICD-10-CM

## 2015-11-23 DIAGNOSIS — G8929 Other chronic pain: Secondary | ICD-10-CM

## 2015-11-23 MED ORDER — BACLOFEN 10 MG PO TABS: ORAL_TABLET | ORAL | Status: DC

## 2015-11-23 MED ORDER — HYDROCODONE-ACETAMINOPHEN 5-325 MG PO TABS: ORAL_TABLET | ORAL | Status: DC

## 2015-11-23 NOTE — Progress Notes (Signed)
Safety precautions to be maintained throughout the outpatient stay will include: orient to surroundings, keep bed in low position, maintain call bell within reach at all times, provide assistance with transfer out of bed and ambulation.  

## 2015-11-23 NOTE — Patient Instructions (Addendum)
PLAN   Continue present medication hydrocodone acetaminophen and begin baclofen. Do not take Zanaflex (TIZANIDINE)   Lumbar epidural steroid injection to be performed at time return appointment  F/U PCP Dr. Carlis Abbott for evaliation of  BP and general medical condition  F/U with neurosurgeon as discussed and  discuss undergoing physical therapy as well as surgery of the cervical region  F/U neurological evaluation. May consider pending follow-up evaluation  May consider radiofrequency rhizolysis or intraspinal procedures pending response to present treatment and F/U evaluation  Patient to call pain management for any change in condition or concerns prior to scheduled appointment if necessary Epidural Steroid Injection Patient Information  Description: The epidural space surrounds the nerves as they exit the spinal cord.  In some patients, the nerves can be compressed and inflamed by a bulging disc or a tight spinal canal (spinal stenosis).  By injecting steroids into the epidural space, we can bring irritated nerves into direct contact with a potentially helpful medication.  These steroids act directly on the irritated nerves and can reduce swelling and inflammation which often leads to decreased pain.  Epidural steroids may be injected anywhere along the spine and from the neck to the low back depending upon the location of your pain.   After numbing the skin with local anesthetic (like Novocaine), a small needle is passed into the epidural space slowly.  You may experience a sensation of pressure while this is being done.  The entire block usually last less than 10 minutes.  Conditions which may be treated by epidural steroids:   Low back and leg pain  Neck and arm pain  Spinal stenosis  Post-laminectomy syndrome  Herpes zoster (shingles) pain  Pain from compression fractures  Preparation for the injection:  1. Do not eat any solid food or dairy products within 6 hours of your  appointment.  2. You may drink clear liquids up to 2 hours before appointment.  Clear liquids include water, black coffee, juice or soda.  No milk or cream please. 3. You may take your regular medication, including pain medications, with a sip of water before your appointment  Diabetics should hold regular insulin (if taken separately) and take 1/2 normal NPH dos the morning of the procedure.  Carry some sugar containing items with you to your appointment. 4. A driver must accompany you and be prepared to drive you home after your procedure.  5. Bring all your current medications with your. 6. An IV may be inserted and sedation may be given at the discretion of the physician.   7. A blood pressure cuff, EKG and other monitors will often be applied during the procedure.  Some patients may need to have extra oxygen administered for a short period. 8. You will be asked to provide medical information, including your allergies, prior to the procedure.  We must know immediately if you are taking blood thinners (like Coumadin/Warfarin)  Or if you are allergic to IV iodine contrast (dye). We must know if you could possible be pregnant.  Possible side-effects:  Bleeding from needle site  Infection (rare, may require surgery)  Nerve injury (rare)  Numbness & tingling (temporary)  Difficulty urinating (rare, temporary)  Spinal headache ( a headache worse with upright posture)  Light -headedness (temporary)  Pain at injection site (several days)  Decreased blood pressure (temporary)  Weakness in arm/leg (temporary)  Pressure sensation in back/neck (temporary)  Call if you experience:  Fever/chills associated with headache or increased back/neck pain.  Headache worsened by an upright position.  New onset weakness or numbness of an extremity below the injection site  Hives or difficulty breathing (go to the emergency room)  Inflammation or drainage at the infection site  Severe  back/neck pain  Any new symptoms which are concerning to you  Please note:  Although the local anesthetic injected can often make your back or neck feel good for several hours after the injection, the pain will likely return.  It takes 3-7 days for steroids to work in the epidural space.  You may not notice any pain relief for at least that one week.  If effective, we will often do a series of three injections spaced 3-6 weeks apart to maximally decrease your pain.  After the initial series, we generally will wait several months before considering a repeat injection of the same type.  If you have any questions, please call [phone] [facility]

## 2015-11-23 NOTE — Progress Notes (Signed)
Subjective:    Patient ID: Karen Edwards, female    DOB: May 14, 1966, 50 y.o.   MRN: [id number]  HPI  The patient is a 50 year old female who returns to pain management Center for further evaluation and treatment of pain involving the neck upper extremity regions as well as the lower back and lower extremity region. The patient was undergo surgical intervention of the cervical region and was denied surgery by patient's insurance carrier. The patient will follow-up with her neurosurgeon to discuss treatment including request for surgical intervention. The patient states that she has increased pain with range of motion maneuvers of the neck and that she has pain and weakness of the upper extremities which interferes with activities of daily living throughout the day. Patient also admits to pain of the lower back and lower extremity region and is in hopes of being able to undergo treatment for her pain at time return appointment to pain management Center. As discussed patient's condition and will continue medications as prescribed this time. The patient will return to her neurosurgeon to discuss considering physical therapy and to discuss resubmitted request to insurance carrier for patient to undergo surgery of the cervical region. All were understanding and agreement suggested treatment plan. We will proceed with lumbar epidural steroid injection at time return appointment in attempt to decrease severity of patient's symptoms including decreased weakness of the extremities as well as decreased pain of the extremities and prevent progression of patient's condition. The patient agreed to suggested treatment plan    Review of Systems     Objective:   Physical Exam  There was tenderness to palpation of the paraspinal musculature in the cervical region cervical facet region with limited range of motion of the cervical spine. There was questionably positive Spurling's maneuver. Patient was with  decreased grip strength and Tinel and Phalen's maneuver were without increase of pain of significant degree. There was tends to palpation of the splenius capitis and occipitalis musculature regions of moderate degree. Palpation over the thoracic facet thoracic paraspinal musculature region was attends to palpation of mild degree to moderate degree with no crepitus of the thoracic region noted. Palpation over the lumbar paraspinal musculature region lumbar facet region was attends to palpation of moderate to moderately severe degree. Lateral bending rotation extension and palpation of the lumbar facets reproduce moderate discomfort. Straight leg raising was limited to approximately 20 with questionable increased pain with dorsiflexion noted. EHL strength appeared to be decreased. There was negative clonus negative Homans. There was tends to palpation over the PSIS and P IIS region a moderate degree. Abdomen was nontender with no costovertebral tenderness noted      Assessment & Plan:      Degenerative changes lumbar spine L1-2 level degenerative disc disease with right central disc extrusion cranial migration of disc material   L1-L2: Small right paracentral disc protrusion. No spinal stenosis or nerve root encroachment.  L2-L3: Mild disc bulging. No spinal stenosis or nerve root encroachment.  L3-L4: Disc height and hydration are maintained. There is mild facet and ligamentous hypertrophy. No spinal stenosis or nerve root encroachment.  L4-L5: Mild disc bulging with mild facet and ligamentous hypertrophy. No spinal stenosis or nerve root encroachment.  L5-S1: Disc height and hydration are maintained. No spinal stenosis or nerve root encroachment.  IMPRESSION:  1. Small right paracentral disc protrusion at L1-L2. 2. Mild disc bulging and facet disease from L2-L3 through L4-L5. 3. No significant spinal stenosis or nerve root encroachment. 4. 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 C6-C7 broad-based disc herniation narrows the spinal canal but does not compress the cord shows foraminal extension. C5-C6 and C6-C7 abnormalities noted with C4-C5 changes apparently have worsened since prior study     PLAN   Continue present medication hydrocodone acetaminophen and begin baclofen. Do not take Zanaflex (TIZANIDINE)   Lumbar epidural steroid injection to be performed at time return appointment  F/U PCP Dr. Carlis Abbott for evaliation of  BP and general medical condition  F/U with neurosurgeon as discussed and  discuss undergoing physical therapy as well as surgery of the cervical region  F/U neurological evaluation. May consider pending follow-up evaluation  May consider radiofrequency rhizolysis or intraspinal procedures pending response to present treatment and F/U evaluation  Patient to call pain management for any change in condition or concerns prior to scheduled appointment

## 2015-11-24 ENCOUNTER — Other Ambulatory Visit: Payer: Self-pay | Admitting: Pain Medicine

## 2015-11-24 ENCOUNTER — Telehealth: Payer: Self-pay

## 2015-11-24 NOTE — Telephone Encounter (Signed)
Pt wants Dr. Primus Bravo to fax over notes to insurance company so that she can have her surgery on her neck her insurance fax number is [phone]

## 2015-11-26 NOTE — Telephone Encounter (Signed)
Karen Edwards Please discuss this matter with me Monday AM As you know, I have requested that, on Fridays,  I receive my messages by having the staff call me to inform me of all messages. Do you know why I was not informed of this message? Please assist with providing patient with the requested information after we discuss her request  Thank you

## 2015-11-27 ENCOUNTER — Ambulatory Visit: Payer: PPO | Attending: Pain Medicine | Admitting: Pain Medicine

## 2015-11-27 ENCOUNTER — Encounter: Payer: Self-pay | Admitting: Pain Medicine

## 2015-11-27 VITALS — BP 133/65 | HR 60 | Temp 98.2°F | Resp 16 | Ht 60.0 in | Wt 140.0 lb

## 2015-11-27 DIAGNOSIS — M47817 Spondylosis without myelopathy or radiculopathy, lumbosacral region: Secondary | ICD-10-CM | POA: Diagnosis not present

## 2015-11-27 DIAGNOSIS — M5416 Radiculopathy, lumbar region: Secondary | ICD-10-CM | POA: Diagnosis not present

## 2015-11-27 DIAGNOSIS — M5010 Cervical disc disorder with radiculopathy, unspecified cervical region: Secondary | ICD-10-CM

## 2015-11-27 DIAGNOSIS — M4802 Spinal stenosis, cervical region: Secondary | ICD-10-CM

## 2015-11-27 DIAGNOSIS — M47816 Spondylosis without myelopathy or radiculopathy, lumbar region: Secondary | ICD-10-CM

## 2015-11-27 DIAGNOSIS — G90523 Complex regional pain syndrome I of lower limb, bilateral: Secondary | ICD-10-CM

## 2015-11-27 DIAGNOSIS — G90521 Complex regional pain syndrome I of right lower limb: Secondary | ICD-10-CM

## 2015-11-27 DIAGNOSIS — M5137 Other intervertebral disc degeneration, lumbosacral region: Secondary | ICD-10-CM

## 2015-11-27 DIAGNOSIS — M5126 Other intervertebral disc displacement, lumbar region: Secondary | ICD-10-CM | POA: Insufficient documentation

## 2015-11-27 DIAGNOSIS — M5030 Other cervical disc degeneration, unspecified cervical region: Secondary | ICD-10-CM

## 2015-11-27 DIAGNOSIS — M5136 Other intervertebral disc degeneration, lumbar region: Secondary | ICD-10-CM

## 2015-11-27 DIAGNOSIS — M5481 Occipital neuralgia: Secondary | ICD-10-CM

## 2015-11-27 DIAGNOSIS — G894 Chronic pain syndrome: Secondary | ICD-10-CM

## 2015-11-27 DIAGNOSIS — M79606 Pain in leg, unspecified: Secondary | ICD-10-CM | POA: Diagnosis not present

## 2015-11-27 DIAGNOSIS — M533 Sacrococcygeal disorders, not elsewhere classified: Secondary | ICD-10-CM

## 2015-11-27 DIAGNOSIS — M791 Myalgia: Secondary | ICD-10-CM | POA: Diagnosis not present

## 2015-11-27 DIAGNOSIS — M545 Low back pain: Secondary | ICD-10-CM | POA: Insufficient documentation

## 2015-11-27 DIAGNOSIS — G8929 Other chronic pain: Secondary | ICD-10-CM

## 2015-11-27 MED ORDER — ORPHENADRINE CITRATE 30 MG/ML IJ SOLN
INTRAMUSCULAR | Status: AC
  Administered 2015-11-27: 60 mg via INTRAMUSCULAR
  Filled 2015-11-27: qty 2

## 2015-11-27 MED ORDER — BUPIVACAINE HCL (PF) 0.25 % IJ SOLN
INTRAMUSCULAR | Status: AC
  Administered 2015-11-27: 10 mL
  Filled 2015-11-27: qty 30

## 2015-11-27 MED ORDER — TRIAMCINOLONE ACETONIDE 40 MG/ML IJ SUSP
INTRAMUSCULAR | Status: AC
  Administered 2015-11-27: 40 mg
  Filled 2015-11-27: qty 1

## 2015-11-27 MED ORDER — TRIAMCINOLONE ACETONIDE 40 MG/ML IJ SUSP
40.0000 mg | Freq: Once | INTRAMUSCULAR | Status: AC
  Administered 2015-11-27: 40 mg

## 2015-11-27 MED ORDER — MIDAZOLAM HCL 5 MG/5ML IJ SOLN
INTRAMUSCULAR | Status: AC
  Administered 2015-11-27: 3 mg via INTRAVENOUS
  Filled 2015-11-27: qty 5

## 2015-11-27 MED ORDER — FENTANYL CITRATE (PF) 100 MCG/2ML IJ SOLN
INTRAMUSCULAR | Status: AC
  Administered 2015-11-27: 100 ug via INTRAVENOUS
  Filled 2015-11-27: qty 2

## 2015-11-27 MED ORDER — FENTANYL CITRATE (PF) 100 MCG/2ML IJ SOLN
100.0000 ug | Freq: Once | INTRAMUSCULAR | Status: AC
  Administered 2015-11-27: 100 ug via INTRAVENOUS

## 2015-11-27 MED ORDER — LIDOCAINE HCL (PF) 1 % IJ SOLN
10.0000 mL | Freq: Once | INTRAMUSCULAR | Status: AC
  Administered 2015-11-27: 4 mg via SUBCUTANEOUS

## 2015-11-27 MED ORDER — LACTATED RINGERS IV SOLN: 1000.0000 mL | INTRAVENOUS | Status: DC

## 2015-11-27 MED ORDER — LIDOCAINE HCL (PF) 1 % IJ SOLN
INTRAMUSCULAR | Status: AC
  Administered 2015-11-27: 4 mg via SUBCUTANEOUS
  Filled 2015-11-27: qty 10

## 2015-11-27 MED ORDER — SODIUM CHLORIDE 0.9% FLUSH: 20.0000 mL | Freq: Once | INTRAVENOUS | Status: DC

## 2015-11-27 MED ORDER — MIDAZOLAM HCL 5 MG/5ML IJ SOLN
5.0000 mg | Freq: Once | INTRAMUSCULAR | Status: AC
  Administered 2015-11-27: 3 mg via INTRAVENOUS

## 2015-11-27 MED ORDER — ORPHENADRINE CITRATE 30 MG/ML IJ SOLN
60.0000 mg | Freq: Once | INTRAMUSCULAR | Status: AC
  Administered 2015-11-27: 60 mg via INTRAMUSCULAR

## 2015-11-27 MED ORDER — SODIUM CHLORIDE 0.9 % IJ SOLN
INTRAMUSCULAR | Status: AC
  Administered 2015-11-27: 4 mL
  Filled 2015-11-27: qty 20

## 2015-11-27 MED ORDER — BUPIVACAINE HCL (PF) 0.25 % IJ SOLN
30.0000 mL | Freq: Once | INTRAMUSCULAR | Status: AC
  Administered 2015-11-27: 10 mL

## 2015-11-27 NOTE — Progress Notes (Signed)
   Subjective:    Patient ID: Karen Edwards, female    DOB: [birth year], 50 y.o.   MRN: [id number]  HPI  PROCEDURE PERFORMED: Lumbar epidural steroid injection   NOTE: The patient is a 50 y.o. female who returns to [facility] for further evaluation and treatment of pain involving the lumbar and lower extremity region. MRI revealed the patient to be with 1. Small right paracentral disc protrusion at L1-L2. 2. Mild disc bulging and facet disease from L2-L3 through L4-L5. 3. No significant spinal stenosis or nerve root encroachment. 4. Suspected uterine fibroids,. There is concern regarding intraspinal abnormalities continue to patient's symptomatology with concern regarding lumbar stenosis and lumbar radiculopathy. The risks, benefits, and expectations of the procedure have been discussed and explained to the patient who was understanding and in agreement with suggested treatment plan. We will proceed with lumbar epidural steroid injection as discussed and as explained to the patient who is willing to proceed with procedure as planned.   DESCRIPTION OF PROCEDURE: Lumbar epidural steroid injection with IV Versed, IV fentanyl conscious sedation, EKG, blood pressure, pulse, and pulse oximetry monitoring. The procedure was performed with the patient in the prone position under fluoroscopic guidance. A local anesthetic skin wheal of 1.5% plain lidocaine was accomplished at proposed entry site. An 18-gauge Tuohy epidural needle was inserted at the L 4 vertebral body level right of the midline via loss-of-resistance technique with negative heme and negative CSF return. A total of 4 mL of Preservative-Free normal saline with 40 mg of Kenalog injected incrementally via epidurally placed needle. Needle was removed.  Myoneural block injections of the gluteal musculature region Following Betadine prep of proposed entry site a 22-gauge needle was inserted in the gluteal musculature region and  following negative aspiration 2 cc of 0.25% bupivacaine with Norflex was injected for myoneural block injection of the gluteal musculature region 4     A total of 40 mg of Kenalog was utilized for the procedure.   The patient tolerated the injection well.    PLAN:   1. Medications: We will continue presently prescribed medications. Zanaflex and hydrocodone acetaminophen 2. Will consider modification of treatment regimen pending response to treatment rendered on today's visit and follow-up evaluation. 3. The patient is to follow-up with primary care physician Dr. Alma Friendly regarding blood pressure and general medical condition status post lumbar epidural steroid injection performed on today's visit. 4. Surgical evaluation as scheduled. The patient will undergo surgical evaluation of pain of the cervical and upper extremity region associated with weakness as well as lumbar and lower extremity pain and weakness 5. Neurological evaluation. Has been addressed 6. The patient may be a candidate for radiofrequency procedures, implantation device, and other treatment pending response to treatment and follow-up evaluation. 7. The patient has been advised to adhere to proper body mechanics and avoid activities which appear to aggravate condition. 8. The patient has been advised to call the [facility] Management Center prior to scheduled return appointment should there be significant change in condition or should there be sign  The patient is understanding and agrees with the suggested  treatment plan   Review of Systems     Objective:   Physical Exam        Assessment & Plan:

## 2015-11-27 NOTE — Patient Instructions (Addendum)
Plan    Continue present medication hydrocodone acetaminophen and Zanflex  F/U PCP Dr. Carlis Abbott for evaliation of  BP and general medical  condition  F/U surgical evaluation as planned and as discussed  F/U neurological evaluation. May consider pending follow-up evaluations  May consider radiofrequency rhizolysis or intraspinal procedures pending response to present treatment and F/U evaluation   Patient to call Pain Management Center should patient have concerns prior to scheduled return appointmentEpidural Steroid Injection Patient Information  Description: The epidural space surrounds the nerves as they exit the spinal cord.  In some patients, the nerves can be compressed and inflamed by a bulging disc or a tight spinal canal (spinal stenosis).  By injecting steroids into the epidural space, we can bring irritated nerves into direct contact with a potentially helpful medication.  These steroids act directly on the irritated nerves and can reduce swelling and inflammation which often leads to decreased pain.  Epidural steroids may be injected anywhere along the spine and from the neck to the low back depending upon the location of your pain.   After numbing the skin with local anesthetic (like Novocaine), a small needle is passed into the epidural space slowly.  You may experience a sensation of pressure while this is being done.  The entire block usually last less than 10 minutes.  Conditions which may be treated by epidural steroids:   Low back and leg pain  Neck and arm pain  Spinal stenosis  Post-laminectomy syndrome  Herpes zoster (shingles) pain  Pain from compression fractures  Preparation for the injection:  1. Do not eat any solid food or dairy products within 6 hours of your appointment.  2. You may drink clear liquids up to 2 hours before appointment.  Clear liquids include water, black coffee, juice or soda.  No milk or cream please. 3. You may take your regular  medication, including pain medications, with a sip of water before your appointment  Diabetics should hold regular insulin (if taken separately) and take 1/2 normal NPH dos the morning of the procedure.  Carry some sugar containing items with you to your appointment. 4. A driver must accompany you and be prepared to drive you home after your procedure.  5. Bring all your current medications with your. 6. An IV may be inserted and sedation may be given at the discretion of the physician.   7. A blood pressure cuff, EKG and other monitors will often be applied during the procedure.  Some patients may need to have extra oxygen administered for a short period. 8. You will be asked to provide medical information, including your allergies, prior to the procedure.  We must know immediately if you are taking blood thinners (like Coumadin/Warfarin)  Or if you are allergic to IV iodine contrast (dye). We must know if you could possible be pregnant.  Possible side-effects:  Bleeding from needle site  Infection (rare, may require surgery)  Nerve injury (rare)  Numbness & tingling (temporary)  Difficulty urinating (rare, temporary)  Spinal headache ( a headache worse with upright posture)  Light -headedness (temporary)  Pain at injection site (several days)  Decreased blood pressure (temporary)  Weakness in arm/leg (temporary)  Pressure sensation in back/neck (temporary)  Call if you experience:  Fever/chills associated with headache or increased back/neck pain.  Headache worsened by an upright position.  New onset weakness or numbness of an extremity below the injection site  Hives or difficulty breathing (go to the emergency room)  Inflammation  or drainage at the infection site  Severe back/neck pain  Any new symptoms which are concerning to you  Please note:  Although the local anesthetic injected can often make your back or neck feel good for several hours after the injection,  the pain will likely return.  It takes 3-7 days for steroids to work in the epidural space.  You may not notice any pain relief for at least that one week.  If effective, we will often do a series of three injections spaced 3-6 weeks apart to maximally decrease your pain.  After the initial series, we generally will wait several months before considering a repeat injection of the same type.  If you have any questions, please call [phone] [facility] Dis[facility] Inst[facility]  General Discharge Instructions :  If you need to reach your doctor call: Monday-Friday 8:00 am - 4:00 pm at [phone] or toll free [phone].  After clinic hours [phone] to have operator reach doctor.  Bring all of your medication bottles to all your appointments in the pain clinic.  To cancel or reschedule your appointment with Pain Management please remember to call 24 hours in advance to avoid a fee.  Refer to the educational materials which you have been given on: General Risks, I had my Procedure. Discharge Instructions, Post Sedation.  Post Procedure Instructions:  The drugs you were given will stay in your system until tomorrow, so for the next 24 hours you should not drive, make any legal decisions or drink any alcoholic beverages.  You may eat anything you prefer, but it is better to start with liquids then soups and crackers, and gradually work up to solid foods.  Please notify your doctor immediately if you have any unusual bleeding, trouble breathing or pain that is not related to your normal pain.  Depending on the type of procedure that was done, some parts of your body may feel week and/or numb.  This usually clears up by tonight or the next day.  Walk with the use of an assistive device or accompanied by an adult for the 24 hours.  You may use ice on the affected area for the first 24 hours.  Put ice in a Ziploc bag and cover with a  towel and place against area 15 minutes on 15 minutes off.  You may switch to heat after 24 hours.

## 2015-11-27 NOTE — Telephone Encounter (Signed)
Patient requested to schedule the LESI  procedure without the prior auth at discharge she agreed to sign for payment if the procedure is denied. The request for prior authorization for the scheduled procedure 11/27/2015 was faxed 11/23/2015 to [employer]. I have not received the auth as of today it usually takes 7-10 business days for response.

## 2015-11-27 NOTE — Progress Notes (Signed)
Safety precautions to be maintained throughout the outpatient stay will include: orient to surroundings, keep bed in low position, maintain call bell within reach at all times, provide assistance with transfer out of bed and ambulation.  

## 2015-11-27 NOTE — Telephone Encounter (Signed)
Thank you :)

## 2015-11-28 ENCOUNTER — Telehealth: Payer: Self-pay | Admitting: *Deleted

## 2015-11-28 NOTE — Telephone Encounter (Signed)
Spoke with patient, verbalizes no complications from procedure.

## 2015-12-07 ENCOUNTER — Encounter: Payer: Self-pay | Admitting: Pain Medicine

## 2015-12-12 ENCOUNTER — Other Ambulatory Visit: Payer: Self-pay | Admitting: Pain Medicine

## 2015-12-14 ENCOUNTER — Ambulatory Visit: Payer: PPO | Attending: Pain Medicine | Admitting: Pain Medicine

## 2015-12-14 ENCOUNTER — Encounter: Payer: Self-pay | Admitting: Pain Medicine

## 2015-12-14 VITALS — BP 109/62 | HR 100 | Temp 98.9°F | Resp 16 | Ht 60.0 in | Wt 139.0 lb

## 2015-12-14 DIAGNOSIS — M50223 Other cervical disc displacement at C6-C7 level: Secondary | ICD-10-CM | POA: Insufficient documentation

## 2015-12-14 DIAGNOSIS — M4802 Spinal stenosis, cervical region: Secondary | ICD-10-CM | POA: Insufficient documentation

## 2015-12-14 DIAGNOSIS — M545 Low back pain, unspecified: Secondary | ICD-10-CM

## 2015-12-14 DIAGNOSIS — M791 Myalgia: Secondary | ICD-10-CM | POA: Diagnosis not present

## 2015-12-14 DIAGNOSIS — M47896 Other spondylosis, lumbar region: Secondary | ICD-10-CM | POA: Insufficient documentation

## 2015-12-14 DIAGNOSIS — M5010 Cervical disc disorder with radiculopathy, unspecified cervical region: Secondary | ICD-10-CM

## 2015-12-14 DIAGNOSIS — G8929 Other chronic pain: Secondary | ICD-10-CM

## 2015-12-14 DIAGNOSIS — M79606 Pain in leg, unspecified: Secondary | ICD-10-CM | POA: Diagnosis not present

## 2015-12-14 DIAGNOSIS — M47812 Spondylosis without myelopathy or radiculopathy, cervical region: Secondary | ICD-10-CM | POA: Insufficient documentation

## 2015-12-14 DIAGNOSIS — M47816 Spondylosis without myelopathy or radiculopathy, lumbar region: Secondary | ICD-10-CM

## 2015-12-14 DIAGNOSIS — M5416 Radiculopathy, lumbar region: Secondary | ICD-10-CM | POA: Diagnosis not present

## 2015-12-14 DIAGNOSIS — M5126 Other intervertebral disc displacement, lumbar region: Secondary | ICD-10-CM | POA: Insufficient documentation

## 2015-12-14 DIAGNOSIS — G90521 Complex regional pain syndrome I of right lower limb: Secondary | ICD-10-CM

## 2015-12-14 DIAGNOSIS — M533 Sacrococcygeal disorders, not elsewhere classified: Secondary | ICD-10-CM | POA: Diagnosis not present

## 2015-12-14 DIAGNOSIS — Z79899 Other long term (current) drug therapy: Secondary | ICD-10-CM | POA: Diagnosis not present

## 2015-12-14 DIAGNOSIS — M5030 Other cervical disc degeneration, unspecified cervical region: Secondary | ICD-10-CM

## 2015-12-14 DIAGNOSIS — M5481 Occipital neuralgia: Secondary | ICD-10-CM | POA: Insufficient documentation

## 2015-12-14 DIAGNOSIS — M50221 Other cervical disc displacement at C4-C5 level: Secondary | ICD-10-CM | POA: Diagnosis not present

## 2015-12-14 DIAGNOSIS — F1190 Opioid use, unspecified, uncomplicated: Secondary | ICD-10-CM | POA: Diagnosis not present

## 2015-12-14 DIAGNOSIS — M47817 Spondylosis without myelopathy or radiculopathy, lumbosacral region: Secondary | ICD-10-CM | POA: Diagnosis not present

## 2015-12-14 DIAGNOSIS — Z5181 Encounter for therapeutic drug level monitoring: Secondary | ICD-10-CM | POA: Diagnosis not present

## 2015-12-14 DIAGNOSIS — G894 Chronic pain syndrome: Secondary | ICD-10-CM

## 2015-12-14 DIAGNOSIS — M5136 Other intervertebral disc degeneration, lumbar region: Secondary | ICD-10-CM

## 2015-12-14 DIAGNOSIS — M50222 Other cervical disc displacement at C5-C6 level: Secondary | ICD-10-CM | POA: Insufficient documentation

## 2015-12-14 DIAGNOSIS — G90523 Complex regional pain syndrome I of lower limb, bilateral: Secondary | ICD-10-CM

## 2015-12-14 DIAGNOSIS — M5137 Other intervertebral disc degeneration, lumbosacral region: Secondary | ICD-10-CM

## 2015-12-14 MED ORDER — BACLOFEN 10 MG PO TABS: ORAL_TABLET | ORAL | Status: DC

## 2015-12-14 MED ORDER — HYDROCODONE-ACETAMINOPHEN 5-325 MG PO TABS: ORAL_TABLET | ORAL | Status: DC

## 2015-12-14 NOTE — Progress Notes (Signed)
Safety precautions to be maintained throughout the outpatient stay will include: orient to surroundings, keep bed in low position, maintain call bell within reach at all times, provide assistance with transfer out of bed and ambulation.  Patient may call on Tuesday for a procedure next Wednesday 12/20/2015

## 2015-12-14 NOTE — Patient Instructions (Addendum)
PLAN   Continue present medication hydrocodone acetaminophen and begin baclofen. Do not take Zanaflex (TIZANIDINE)   Lumbosacral selective nerve root block to be performed at time of return appointment  F/U PCP Dr. Carlis Abbott for evaliation of  BP and general medical condition  F/U with neurosurgeon as discussed and  discuss undergoing physical therapy as well as surgery of the cervical region and lumbar region  F/U neurological evaluation. May consider pending follow-up evaluation  May consider radiofrequency rhizolysis or intraspinal procedures pending response to present treatment and F/U evaluation  Patient to call pain management for any change in condition or concerns prior to scheduled appointment if necessaryPain Management Discharge Instructions  General Discharge Instructions :  If you need to reach your doctor call: Monday-Friday 8:00 am - 4:00 pm at [phone] or toll free [phone].  After clinic hours [phone] to have operator reach doctor.  Bring all of your medication bottles to all your appointments in the pain clinic.  To cancel or reschedule your appointment with Pain Management please remember to call 24 hours in advance to avoid a fee.  Refer to the educational materials which you have been given on: General Risks, I had my Procedure. Discharge Instructions, Post Sedation.  Post Procedure Instructions:  The drugs you were given will stay in your system until tomorrow, so for the next 24 hours you should not drive, make any legal decisions or drink any alcoholic beverages.  You may eat anything you prefer, but it is better to start with liquids then soups and crackers, and gradually work up to solid foods.  Please notify your doctor immediately if you have any unusual bleeding, trouble breathing or pain that is not related to your normal pain.  Depending on the type of procedure that was done, some parts of your body may feel week and/or numb.  This  usually clears up by tonight or the next day.  Walk with the use of an assistive device or accompanied by an adult for the 24 hours.  You may use ice on the affected area for the first 24 hours.  Put ice in a Ziploc bag and cover with a towel and place against area 15 minutes on 15 minutes off.  You may switch to heat after 24 hours.Selective Nerve Root Block Patient Information  Description: Specific nerve roots exit the spinal canal and these nerves can be compressed and inflamed by a bulging disc and bone spurs.  By injecting steroids on the nerve root, we can potentially decrease the inflammation surrounding these nerves, which often leads to decreased pain.  Also, by injecting local anesthesia on the nerve root, this can provide [place] helpful information to give to your referring doctor if it decreases your pain.  Selective nerve root blocks can be done along the spine from the neck to the low back depending on the location of your pain.   After numbing the skin with local anesthesia, a small needle is passed to the nerve root and the position of the needle is verified using x-ray pictures.  After the needle is in correct position, we then deposit the medication.  You may experience a pressure sensation while this is being done.  The entire block usually lasts less than 15 minutes.  Conditions that may be treated with selective nerve root blocks: Low back and leg pain Spinal stenosis Diagnostic block prior to potential surgery Neck and arm pain Post laminectomy syndrome  Preparation for the injection:  Do not eat any  solid food or dairy products within 6 hours of your appointment. You may drink clear liquids up to 2 hours before an appointment.  Clear liquids include water, black coffee, juice or soda.  No milk or cream please. You may take your regular medications, including pain medications, with a sip of water before your appointment.  Diabetics should hold regular insulin (if taken  separately) and take 1/2 normal NPH dose the morning of the procedure.  Carry some sugar containing items with you to your appointment. A driver must accompany you and be prepared to drive you home after your procedure. Bring all your current medications with you. An IV may be inserted and sedation may be given at the discretion of the physician. A blood pressure cuff, EKG, and other monitors will often be applied during the procedure.  Some patients may need to have extra oxygen administered for a short period. You will be asked to provide medical information, including allergies, prior to the procedure.  We must know immediately if you are taking blood  Thinners (like Coumadin) or if you are allergic to IV iodine contrast (dye).  Possible side-effects: All are usually temporary Bleeding from needle site Light headedness Numbness and tingling Decreased blood pressure Weakness in arms/legs Pressure sensation in back/neck Pain at injection site (several days)  Possible complications: All are extremely rare Infection Nerve injury Spinal headache (a headache wore with upright position)  Call if you experience: Fever/chills associated with headache or increased back/neck pain Headache worsened by an upright position New onset weakness or numbness of an extremity below the injection site Hives or difficulty breathing (go to the emergency room) Inflammation or drainage at the injection site(s) Severe back/neck pain greater than usual New symptoms which are concerning to you  Please note:  Although the local anesthetic injected can often make your back or neck feel good for several hours after the injection the pain will likely return.  It takes 3-5 days for steroids to work on the nerve root. You may not notice any pain relief for at least one week.  If effective, we will often do a series of 3 injections spaced 3-6 weeks apart to maximally decrease your pain.    If you have any  questions, please call [phone] [facility] Clinic[facility]AL RISKS AND COMPLICATIONS  What are the risk, side effects and possible complications? Generally speaking, most procedures are safe.  However, with any procedure there are risks, side effects, and the possibility of complications.  The risks and complications are dependent upon the sites that are lesioned, or the type of nerve block to be performed.  The closer the procedure is to the spine, the more serious the risks are.  Great care is taken when placing the radio frequency needles, block needles or lesioning probes, but sometimes complications can occur. 1. Infection: Any time there is an injection through the skin, there is a risk of infection.  This is why sterile conditions are used for these blocks.  There are four possible types of infection. 1. Localized skin infection. 2. Central Nervous System Infection-This can be in the form of Meningitis, which can be deadly. 3. Epidural Infections-This can be in the form of an epidural abscess, which can cause pressure inside of the spine, causing compression of the spinal cord with subsequent paralysis. This would require an emergency surgery to decompress, and there are no guarantees that the patient would recover from the paralysis. 4. Discitis-This is an infection of  the intervertebral discs.  It occurs in about 1% of discography procedures.  It is difficult to treat and it may lead to surgery.        2. Pain: the needles have to go through skin and soft tissues, will cause soreness.       3. Damage to internal structures:  The nerves to be lesioned may be near blood vessels or    other nerves which can be potentially damaged.       4. Bleeding: Bleeding is more common if the patient is taking blood thinners such as  aspirin, Coumadin, Ticiid, Plavix, etc., or if he/she have some genetic predisposition  such as hemophilia. Bleeding into the spinal canal can cause  compression of the spinal  cord with subsequent paralysis.  This would require an emergency surgery to  decompress and there are no guarantees that the patient would recover from the  paralysis.       5. Pneumothorax:  Puncturing of a lung is a possibility, every time a needle is introduced in  the area of the chest or upper back.  Pneumothorax refers to free air around the  collapsed lung(s), inside of the thoracic cavity (chest cavity).  Another two possible  complications related to a similar event would include: Hemothorax and Chylothorax.   These are variations of the Pneumothorax, where instead of air around the collapsed  lung(s), you may have blood or chyle, respectively.       6. Spinal headaches: They may occur with any procedures in the area of the spine.       7. Persistent CSF (Cerebro-Spinal Fluid) leakage: This is a rare problem, but may occur  with prolonged intrathecal or epidural catheters either due to the formation of a fistulous  track or a dural tear.       8. Nerve damage: By working so close to the spinal cord, there is always a possibility of  nerve damage, which could be as serious as a permanent spinal cord injury with  paralysis.       9. Death:  Although rare, severe deadly allergic reactions known as "Anaphylactic  reaction" can occur to any of the medications used.      10. Worsening of the symptoms:  We can always make thing worse.  What are the chances of something like this happening? Chances of any of this occuring are extremely low.  By statistics, you have more of a chance of getting killed in a motor vehicle accident: while driving to the hospital than any of the above occurring .  Nevertheless, you should be aware that they are possibilities.  In general, it is similar to taking a shower.  Everybody knows that you can slip, hit your head and get killed.  Does that mean that you should not shower again?  Nevertheless always keep in mind that statistics do not mean  anything if you happen to be on the wrong side of them.  Even if a procedure has a 1 (one) in a 1,000,000 (million) chance of going wrong, it you happen to be that one..Also, keep in mind that by statistics, you have more of a chance of having something go wrong when taking medications.  Who should not have this procedure? If you are on a blood thinning medication (e.g. Coumadin, Plavix, see list of "Blood Thinners"), or if you have an active infection going on, you should not have the procedure.  If you are taking any blood thinners,  please inform your physician.  How should I prepare for this procedure?  Do not eat or drink anything at least six hours prior to the procedure.  Bring a driver with you .  It cannot be a taxi.  Come accompanied by an adult that can drive you back, and that is strong enough to help you if your legs get weak or numb from the local anesthetic.  Take all of your medicines the morning of the procedure with just enough water to swallow them.  If you have diabetes, make sure that you are scheduled to have your procedure done first thing in the morning, whenever possible.  If you have diabetes, take only half of your insulin dose and notify our nurse that you have done so as soon as you arrive at the clinic.  If you are diabetic, but only take blood sugar pills (oral hypoglycemic), then do not take them on the morning of your procedure.  You may take them after you have had the procedure.  Do not take aspirin or any aspirin-containing medications, at least eleven (11) days prior to the procedure.  They may prolong bleeding.  Wear loose fitting clothing that may be easy to take off and that you would not mind if it got stained with Betadine or blood.  Do not wear any jewelry or perfume  Remove any nail coloring.  It will interfere with some of our monitoring equipment.  NOTE: Remember that this is not meant to be interpreted as a complete list of all possible  complications.  Unforeseen problems may occur.  BLOOD THINNERS The following drugs contain aspirin or other products, which can cause increased bleeding during surgery and should not be taken for 2 weeks prior to and 1 week after surgery.  If you should need take something for relief of minor pain, you may take acetaminophen which is found in Tylenol,m Datril, Anacin-3 and Panadol. It is not blood thinner. The products listed below are.  Do not take any of the products listed below in addition to any listed on your instruction sheet.  A.P.C or A.P.C with Codeine Codeine Phosphate Capsules #3 Ibuprofen Ridaura  ABC compound Congesprin Imuran rimadil  Advil Cope Indocin Robaxisal  Alka-Seltzer Effervescent Pain Reliever and Antacid Coricidin or Coricidin-D  Indomethacin Rufen  Alka-Seltzer plus Cold Medicine Cosprin Ketoprofen S-A-C Tablets  Anacin Analgesic Tablets or Capsules Coumadin Korlgesic Salflex  Anacin Extra Strength Analgesic tablets or capsules CP-2 Tablets Lanoril Salicylate  Anaprox Cuprimine Capsules Levenox Salocol  Anexsia-D Dalteparin Magan Salsalate  Anodynos Darvon compound Magnesium Salicylate Sine-off  Ansaid Dasin Capsules Magsal Sodium Salicylate  Anturane Depen Capsules Marnal Soma  APF Arthritis pain formula Dewitt's Pills Measurin Stanback  Argesic Dia-Gesic Meclofenamic Sulfinpyrazone  Arthritis Bayer Timed Release Aspirin Diclofenac Meclomen Sulindac  Arthritis pain formula Anacin Dicumarol Medipren Supac  Analgesic (Safety coated) Arthralgen Diffunasal Mefanamic Suprofen  Arthritis Strength Bufferin Dihydrocodeine Mepro Compound Suprol  Arthropan liquid Dopirydamole Methcarbomol with Aspirin Synalgos  ASA tablets/Enseals Disalcid Micrainin Tagament  Ascriptin Doan's Midol Talwin  Ascriptin A/D Dolene Mobidin Tanderil  Ascriptin Extra Strength Dolobid Moblgesic Ticlid  Ascriptin with Codeine Doloprin or Doloprin with Codeine Momentum Tolectin  Asperbuf Duoprin  Mono-gesic Trendar  Aspergum Duradyne Motrin or Motrin IB Triminicin  Aspirin plain, buffered or enteric coated Durasal Myochrisine Trigesic  Aspirin Suppositories Easprin Nalfon Trillsate  Aspirin with Codeine Ecotrin Regular or Extra Strength Naprosyn Uracel  Atromid-S Efficin Naproxen Ursinus  Auranofin Capsules Elmiron Neocylate Vanquish  Axotal  Emagrin Norgesic Verin  Azathioprine Empirin or Empirin with Codeine Normiflo Vitamin E  Azolid Emprazil Nuprin Voltaren  Bayer Aspirin plain, buffered or children's or timed BC Tablets or powders Encaprin Orgaran Warfarin Sodium  Buff-a-Comp Enoxaparin Orudis Zorpin  Buff-a-Comp with Codeine Equegesic Os-Cal-Gesic   Buffaprin Excedrin plain, buffered or Extra Strength Oxalid   Bufferin Arthritis Strength Feldene Oxphenbutazone   Bufferin plain or Extra Strength Feldene Capsules Oxycodone with Aspirin   Bufferin with Codeine Fenoprofen Fenoprofen Pabalate or Pabalate-SF   Buffets II Flogesic Panagesic   Buffinol plain or Extra Strength Florinal or Florinal with Codeine Panwarfarin   Buf-Tabs Flurbiprofen Penicillamine   Butalbital Compound Four-way cold tablets Penicillin   Butazolidin Fragmin Pepto-Bismol   Carbenicillin Geminisyn Percodan   Carna Arthritis Reliever Geopen Persantine   Carprofen Gold's salt Persistin   Chloramphenicol Goody's Phenylbutazone   Chloromycetin Haltrain Piroxlcam   Clmetidine heparin Plaquenil   Cllnoril Hyco-pap Ponstel   Clofibrate Hydroxy chloroquine Propoxyphen         Before stopping any of these medications, be sure to consult the physician who ordered them.  Some, such as Coumadin (Warfarin) are ordered to prevent or treat serious conditions such as "deep thrombosis", "pumonary embolisms", and other heart problems.  The amount of time that you may need off of the medication may also vary with the medication and the reason for which you were taking it.  If you are taking any of these medications, please  make sure you notify your pain physician before you undergo any procedures.

## 2015-12-14 NOTE — Progress Notes (Signed)
Subjective:    Patient ID: Karen Edwards, female    DOB: Apr 30, 1966, 50 y.o.   MRN: [id number]  HPI The patient is a 50 year old female who returns to pain management for further evaluation and treatment of pain involving the lumbar and lower extremity region. The patient continues to have lumbar and lower extremity pain with pain radiating to the right lower extremity greater than the left lower extremity. The patient states she has had significant improvement with previous lumbosacral selective nerve root blocks. We will also discuss pain involving the neck and upper extremity region and patient is to undergo surgical evaluation of pain of the cervical and upper extremity regions as well as the lumbar and lower extremity regions. The patient wishes to proceed with interventional treatment at the present time to decrease severely disabling lumbar lower extremity pain. We will continue medications consisting of Zanaflex and hydrocodone and will consider additional medications of treatment pending follow-up evaluation. The patient was with understanding and wished to proceed with lumbosacral selective nerve root block at time return appointment as discussed. All agreed to suggested treatment plan the patient denied any trauma change in events of daily living the call significant change in symptomatology.   Review of Systems     Objective:   Physical Exam   There was tenderness of the splenius capitis and occipitalis musculature regions a patient of the reproduced pain of moderate degree. There was limited range of motion of the cervical spine without a definite positive finding on Spurling's maneuver. The patient was with slightly decreased grip strength and Tinel and Phalen's maneuver were without increase of pain of significant degree. There was tends to palpation of the acromioclavicular and glenohumeral joint region a mild degree. Palpation of the thoracic facet thoracic paraspinal must reason  was attends to palpation of mild to moderate degree with muscle spasms noted in the upper mid and lower thoracic regions with no crepitus of the thoracic region noted. Palpation over the lumbar paraspinal musculature region lumbar facet region was with moderate discomfort with moderate muscle spasms of the lumbar paraspinal musculature region with increased pain with palpation over the lumbar facet region with lateral bending and rotation increasing pain was significantly. Straight leg raising was tolerates approximately 20 without increased pain with dorsiflexion noted. DTRs were difficult to elicit. There was negative clonus negative Homans. No definite sensory deficit or dermatomal dystrophy the lower extremities noted. EHL strength appeared to be decreased on the right compared to the left. There was negative clonus negative Homans. 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 No spinal stenosis or nerve root encroachment.  L2-L3: Mild disc bulging. No spinal stenosis or nerve root encroachment.  L3-L4: Disc height and hydration are maintained. There is mild facet and ligamentous hypertrophy. No spinal stenosis or nerve root encroachment.  L4-L5: Mild disc bulging with mild facet and ligamentous hypertrophy. No spinal stenosis or nerve root encroachment.  L5-S1: Disc height and hydration are maintained. No spinal stenosis or nerve root encroachment.  IMPRESSION:  1. Small right paracentral disc protrusion at L1-L2. 2. Mild disc bulging and facet disease from L2-L3 through L4-L5. 3. No significant spinal stenosis or nerve root encroachment. 4. Suspected uterine fibroids,   Lumbar facet syndrome  Lumbar  radiculopathy  Sacroiliac  joint dysfunction  Bilateral occipital neuralgia  Degenerative disc disease cervical spine C4-C5 shallow disc protrusion that indents the thecal sac. 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 C6-C7 broad-based disc herniation narrows the spinal canal but does not compress the cord shows foraminal extension. C5-C6 and C6-C7 abnormalities noted with C4-C5 changes apparently have worsened since prior study      PLAN   Continue present medication hydrocodone acetaminophen and begin baclofen. Do not take Zanaflex (TIZANIDINE)   Lumbosacral selective nerve root block to be performed at time of return appointment  F/U PCP Dr. Carlis Abbott for evaliation of  BP and general medical condition  F/U with neurosurgeon as discussed and  discuss undergoing physical therapy as well as surgery of the cervical region and lumbar region  F/U neurological evaluation. May consider pending follow-up evaluation  May consider radiofrequency rhizolysis or intraspinal procedures pending response to present treatment and F/U evaluation  Patient to call pain management for any change in condition or concerns prior to scheduled appointment if necessary

## 2015-12-19 ENCOUNTER — Telehealth: Payer: Self-pay | Admitting: Primary Care

## 2015-12-19 NOTE — Telephone Encounter (Signed)
Patient Name: Karen Edwards DOB: [birth year] Initial Comment Caller states she has fever, aches and pains, headache, sore throat, cough. Nurse Assessment Nurse: Vallery Sa, RN, Cathy Date/Time (Eastern Time): 12/19/2015 1:19:44 PM Confirm and document reason for call. If symptomatic, describe symptoms. You must click the next button to save text entered. ---[name] states she developed Flu symptoms (sore throat, cough, cold, fever 101.1 oral today, muscle aches and pains) yesterday. No severe breathing difficulty. Alert and responsive. Has the patient traveled out of the country within the last 30 days? ---No Does the patient have any new or worsening symptoms? ---Yes Will a triage be completed? ---Yes Related visit to physician within the last 2 weeks? ---No Does the PT have any chronic conditions? (i.e. diabetes, asthma, etc.) ---Yes List chronic conditions. ---Neck and back problems, Is the patient pregnant or possibly pregnant? (Ask all females between the ages of 52-55) ---No Is this a behavioral health or substance abuse call? ---No Guidelines Guideline Title Affirmed Question Affirmed Notes Influenza - Seasonal Chest pain (Exception: MILD central chest pain, present only when coughing) Final Disposition User Go to ED Now Vallery Sa, RN, Cathy Referrals GO TO FACILITY REFUSED Disagree/Comply: Disagree Disagree/Comply Reason: Disagree with instructions Caller declined the Go to ER disposition. Reinforced the Go to ER disposition. Called the office and notified Vaughan Basta.

## 2015-12-19 NOTE — Telephone Encounter (Signed)
Called and spoken to patient. She decline on coming into the office for her symptoms. Patient stated she feels too awful to make to the office.

## 2015-12-19 NOTE — Telephone Encounter (Signed)
She does not need to go to the ED with her symptoms. She may be seen in our clinic at her convenience. Will you please call and schedule?

## 2015-12-19 NOTE — Telephone Encounter (Signed)
[facility] advised atient to go to the ER with her symptoms, but the patient refused to go.

## 2015-12-20 ENCOUNTER — Telehealth: Payer: PPO | Admitting: Nurse Practitioner

## 2015-12-20 ENCOUNTER — Other Ambulatory Visit: Payer: Self-pay | Admitting: Pain Medicine

## 2015-12-20 DIAGNOSIS — J111 Influenza due to unidentified influenza virus with other respiratory manifestations: Secondary | ICD-10-CM

## 2015-12-20 MED ORDER — OSELTAMIVIR PHOSPHATE 75 MG PO CAPS: 75.0000 mg | ORAL_CAPSULE | Freq: Two times a day (BID) | ORAL | Status: DC

## 2015-12-20 NOTE — Progress Notes (Signed)
E visit for Flu like symptoms   We are sorry that you are not feeling well.  Here is how we plan to help! Based on what you have shared with me it looks like you may have possible exposure to a virus that causes influenza.  Influenza or "the flu" is   an infection caused by a respiratory virus. The flu virus is highly contagious and persons who did not receive their yearly flu vaccination may "catch" the flu from close contact.  We have anti-viral medications to treat the viruses that cause this infection. They are not a "cure" and only shorten the course of the infection. These prescriptions are most effective when they are given within the first 2 days of "flu" symptoms. Antiviral medication are indicated if you have a high risk of complications from the flu. You should  also consider an antiviral medication if you are in close contact with someone who is at risk. These medications can help patients avoid complications from the flu  but have side effects that you should know. Possible side effects from Tamiflu or oseltamivir include nausea, vomiting, diarrhea, dizziness, headaches, eye redness, sleep problems or other respiratory symptoms. You should not take Tamiflu if you have an allergy to oseltamivir or any to the ingredients in Tamiflu.  Based upon your symptoms and potential risk factors I have prescribed Oseltamivir (Tamiflu).  It has been sent to your designated pharmacy.  You will take one 75 mg capsule orally twice a day for the next 5 days.  ANYONE WHO HAS FLU SYMPTOMS SHOULD: . Stay home. The flu is highly contagious and going out or to work exposes others! . Be sure to drink plenty of fluids. Water is fine as well as fruit juices, sodas and electrolyte beverages. You may want to stay away from caffeine or alcohol. If you are nauseated, try taking small sips of liquids. How do you know if you are getting enough fluid? Your urine should be a pale yellow or almost colorless. . Get  rest. . Taking a steamy shower or using a humidifier may help nasal congestion and ease sore throat pain. Using a saline nasal spray works much the same way. . Cough drops, hard candies and sore throat lozenges may ease your cough. . Line up a caregiver. Have someone check on you regularly.   GET HELP RIGHT AWAY IF: . You cannot keep down liquids or your medications. . You become short of breath . Your fell like you are going to pass out or loose consciousness. . Your symptoms persist after you have completed your treatment plan MAKE SURE YOU   Understand these instructions.  Will watch your condition.  Will get help right away if you are not doing well or get worse.  Your e-visit answers were reviewed by a board certified advanced clinical practitioner to complete your personal care plan.  Depending on the condition, your plan could have included both over the counter or prescription medications.  If there is a problem please reply  once you have received a response from your provider.  Your safety is important to us.  If you have drug allergies check your prescription carefully.    You can use MyChart to ask questions about today's visit, request a non-urgent call back, or ask for a work or school excuse for 24 hours related to this e-Visit. If it has been greater than 24 hours you will need to follow up with your provider, or enter a new   e-Visit to address those concerns.  You will get an e-mail in the next two days asking about your experience.  I hope that your e-visit has been valuable and will speed your recovery. Thank you for using e-visits.   

## 2015-12-22 ENCOUNTER — Ambulatory Visit: Payer: PPO | Admitting: Primary Care

## 2015-12-25 ENCOUNTER — Encounter: Payer: Self-pay | Admitting: Pain Medicine

## 2015-12-25 ENCOUNTER — Ambulatory Visit: Payer: PPO | Attending: Pain Medicine | Admitting: Pain Medicine

## 2015-12-25 VITALS — BP 133/72 | HR 63 | Temp 98.2°F | Resp 17 | Ht 60.0 in | Wt 135.0 lb

## 2015-12-25 DIAGNOSIS — M5126 Other intervertebral disc displacement, lumbar region: Secondary | ICD-10-CM | POA: Diagnosis not present

## 2015-12-25 DIAGNOSIS — M5137 Other intervertebral disc degeneration, lumbosacral region: Secondary | ICD-10-CM

## 2015-12-25 DIAGNOSIS — M79606 Pain in leg, unspecified: Secondary | ICD-10-CM | POA: Diagnosis not present

## 2015-12-25 DIAGNOSIS — G90521 Complex regional pain syndrome I of right lower limb: Secondary | ICD-10-CM

## 2015-12-25 DIAGNOSIS — M5010 Cervical disc disorder with radiculopathy, unspecified cervical region: Secondary | ICD-10-CM

## 2015-12-25 DIAGNOSIS — M5416 Radiculopathy, lumbar region: Secondary | ICD-10-CM

## 2015-12-25 DIAGNOSIS — G90523 Complex regional pain syndrome I of lower limb, bilateral: Secondary | ICD-10-CM

## 2015-12-25 DIAGNOSIS — M47816 Spondylosis without myelopathy or radiculopathy, lumbar region: Secondary | ICD-10-CM

## 2015-12-25 DIAGNOSIS — M5136 Other intervertebral disc degeneration, lumbar region: Secondary | ICD-10-CM

## 2015-12-25 DIAGNOSIS — M545 Low back pain: Secondary | ICD-10-CM | POA: Diagnosis not present

## 2015-12-25 DIAGNOSIS — G8929 Other chronic pain: Secondary | ICD-10-CM

## 2015-12-25 DIAGNOSIS — M533 Sacrococcygeal disorders, not elsewhere classified: Secondary | ICD-10-CM

## 2015-12-25 DIAGNOSIS — M4802 Spinal stenosis, cervical region: Secondary | ICD-10-CM

## 2015-12-25 DIAGNOSIS — G894 Chronic pain syndrome: Secondary | ICD-10-CM

## 2015-12-25 DIAGNOSIS — M5030 Other cervical disc degeneration, unspecified cervical region: Secondary | ICD-10-CM

## 2015-12-25 MED ORDER — BUPIVACAINE HCL (PF) 0.25 % IJ SOLN
30.0000 mL | Freq: Once | INTRAMUSCULAR | Status: DC
Start: 2015-12-25 — End: 2016-03-20

## 2015-12-25 MED ORDER — LACTATED RINGERS IV SOLN: 1000.0000 mL | INTRAVENOUS | Status: DC

## 2015-12-25 MED ORDER — MIDAZOLAM HCL 5 MG/5ML IJ SOLN
INTRAMUSCULAR | Status: AC
  Administered 2015-12-25: 5 mg via INTRAVENOUS
  Filled 2015-12-25: qty 5

## 2015-12-25 MED ORDER — BUPIVACAINE HCL (PF) 0.25 % IJ SOLN
INTRAMUSCULAR | Status: AC
  Administered 2015-12-25: 13:00:00
  Filled 2015-12-25: qty 30

## 2015-12-25 MED ORDER — FENTANYL CITRATE (PF) 100 MCG/2ML IJ SOLN: 100.0000 ug | Freq: Once | INTRAMUSCULAR | Status: DC

## 2015-12-25 MED ORDER — TRIAMCINOLONE ACETONIDE 40 MG/ML IJ SUSP
INTRAMUSCULAR | Status: AC
  Administered 2015-12-25: 13:00:00
  Filled 2015-12-25: qty 1

## 2015-12-25 MED ORDER — TRIAMCINOLONE ACETONIDE 40 MG/ML IJ SUSP: 40.0000 mg | Freq: Once | INTRAMUSCULAR | Status: DC

## 2015-12-25 MED ORDER — ORPHENADRINE CITRATE 30 MG/ML IJ SOLN: 60.0000 mg | Freq: Once | INTRAMUSCULAR | Status: DC

## 2015-12-25 MED ORDER — LIDOCAINE HCL (PF) 1 % IJ SOLN
10.0000 mL | Freq: Once | INTRAMUSCULAR | Status: DC
Start: 2015-12-25 — End: 2016-03-20

## 2015-12-25 MED ORDER — FENTANYL CITRATE (PF) 100 MCG/2ML IJ SOLN
INTRAMUSCULAR | Status: AC
  Administered 2015-12-25: 100 ug via INTRAVENOUS
  Filled 2015-12-25: qty 2

## 2015-12-25 MED ORDER — MIDAZOLAM HCL 5 MG/5ML IJ SOLN: 5.0000 mg | Freq: Once | INTRAMUSCULAR | Status: DC

## 2015-12-25 MED ORDER — ORPHENADRINE CITRATE 30 MG/ML IJ SOLN
INTRAMUSCULAR | Status: AC
  Administered 2015-12-25: 4 mg via INTRAVENOUS
  Filled 2015-12-25: qty 2

## 2015-12-25 NOTE — Patient Instructions (Addendum)
PLAN   Continue present medication hydrocodone acetaminophen and begin baclofen. Do not take Zanaflex (TIZANIDINE)  F/U PCP Dr. Carlis Abbott for evaliation of  BP and general medical condition  F/U with neurosurgeon as discussed and  discuss undergoing physical therapy as well as surgery of the cervical region and lumbar region  F/U neurological evaluation. May consider pending follow-up evaluation  May consider radiofrequency rhizolysis or intraspinal procedures pending response to present treatment and F/U evaluation  Patient to call pain management for any change in condition or concerns prior to scheduled appointment if necessaryPain Management Discharge Instructions  General Discharge Instructions :  If you need to reach your doctor call: Monday-Friday 8:00 am - 4:00 pm at [phone] or toll free [phone].  After clinic hours [phone] to have operator reach doctor.  Bring all of your medication bottles to all your appointments in the pain clinic.  To cancel or reschedule your appointment with Pain Management please remember to call 24 hours in advance to avoid a fee.  Refer to the educational materials which you have been given on: General Risks, I had my Procedure. Discharge Instructions, Post Sedation.  Post Procedure Instructions:  The drugs you were given will stay in your system until tomorrow, so for the next 24 hours you should not drive, make any legal decisions or drink any alcoholic beverages.  You may eat anything you prefer, but it is better to start with liquids then soups and crackers, and gradually work up to solid foods.  Please notify your doctor immediately if you have any unusual bleeding, trouble breathing or pain that is not related to your normal pain.  Depending on the type of procedure that was done, some parts of your body may feel week and/or numb.  This usually clears up by tonight or the next day.  Walk with the use of an assistive device or  accompanied by an adult for the 24 hours.  You may use ice on the affected area for the first 24 hours.  Put ice in a Ziploc bag and cover with a towel and place against area 15 minutes on 15 minutes off.  You may switch to heat after 24 hours.

## 2015-12-25 NOTE — Progress Notes (Signed)
Safety precautions to be maintained throughout the outpatient stay will include: orient to surroundings, keep bed in low position, maintain call bell within reach at all times, provide assistance with transfer out of bed and ambulation.  

## 2015-12-25 NOTE — Progress Notes (Signed)
Subjective:    Patient ID: Karen Edwards, female    DOB: 10/24/1966, 50 y.o.   MRN: [id number]  HPI PROCEDURE PERFORMED: Lumbosacral selective nerve root block   NOTE: The patient is a 50 y.o. female who returns to [place] for further evaluation and treatment of pain involving the lumbar and lower extremity region. Studies consisting of MRI has revealed the patient to be with evidence of Degenerative changes lumbar spine L1-2 level degenerative disc disease with right central disc extrusion cranial migration of disc material   L1-L2: Small right paracentral disc protrusion. No spinal stenosis or nerve root encroachment.  L2-L3: Mild disc bulging. No spinal stenosis or nerve root encroachment.  L3-L4: Disc height and hydration are maintained. There is mild facet and ligamentous hypertrophy. No spinal stenosis or nerve root encroachment.  L4-L5: Mild disc bulging with mild facet and ligamentous hypertrophy. No spinal stenosis or nerve root encroachment.  L5-S1: Disc height and hydration are maintained. No spinal stenosis or nerve root encroachment.  IMPRESSION:  1. Small right paracentral disc protrusion at L1-L2. 2. Mild disc bulging and facet disease from L2-L3 through L4-L5. 3. No significant spinal stenosis or nerve root encroachment. 4. Suspected uterine fibroids,   . There is concern regarding intraspinal abnormalities contributing to the patient's symptomatology. There is concern regarding component of lumbar radiculopathy contributing to patient's symptomatology The risks, benefits, and expectations of the procedure have been explained to the patient who was understanding and in agreement with suggested treatment plan. We will proceed with interventional treatment as discussed and as explained to the patient. The patient is understanding and in agreement with suggested treatment plan.   DESCRIPTION OF PROCEDURE: Lumbosacral selective  nerve root block with IV Versed, IV fentanyl conscious sedation, EKG, blood pressure, pulse, and pulse oximetry monitoring. The procedure was performed with the patient in the prone position under fluoroscopic guidance. With the patient in the prone position, Betadine prep of proposed entry site was performed. Local anesthetic skin wheal of proposed needle entry site was prepared with 1.5% plain lidocaine with AP view of the lumbosacral spine.   PROCEDURE #1: Needle placement at the left L 2 vertebral body: A 22 -gauge needle was inserted at the inferior border of the transverse process of the vertebral body with needle placed medial to the midline of the transverse process on AP view of the lumbosacral spine.   NEEDLE PLACEMENT AT  L3, L4, and L5  VERTEBRAL BODY LEVELS  Needle  placement was accomplished at L3, L4, and L5  vertebral body levels on the left side exactly as was accomplished at the L2  vertebral body level  and utilizing the same technique and under fluoroscopic guidance.   Needle placement was then verified on lateral view at all levels with needle tip documented to be in the posterior superior quadrant of the intervertebral foramen of  L 2, L3, L4, and L5. Following negative aspiration for heme and CSF at each level, each level was injected with 3 mL of 0.25% bupivacaine with Kenalog.   LUMBOSACRAL SELECTIVE NERVE ROOT BLOCKS THE THE  RIGHT SIDE  The procedure was performed on the right side exactly as was performed on the left side and at the same levels  Under fluoroscopic guidance and utilizing the same technique.    The patient tolerated the procedure well. A total of 10 mg of Kenalog was utilized for the procedure.   PLAN:  1. Medications: Will continue presently prescribed medications 2. The  patient is to undergo follow-up evaluation with PCP Dr. Despina Pole for evaluation of blood pressure and general medical condition status post procedure performed on today's  visit. 3. Surgical follow-up evaluation.Marland Kitchen Has been addressed. Patient will follow-up with neurosurgeon as planned 4. Neurological evaluation. Has been addressed 5. May consider radiofrequency procedures, implantation type procedures and other treatment pending response to treatment and follow-up evaluation. 6. The patient has been advise do adhere to proper body mechanics and avoid activities which may aggravate condition. 7. The patient has been advised to call the [facility] prior to scheduled return appointment should there be significant change in the patient's condition or should the patient have other concerns regarding condition prior to scheduled return appointment.       Review of Systems     Objective:   Physical Exam        Assessment & Plan:

## 2015-12-26 ENCOUNTER — Telehealth: Payer: Self-pay | Admitting: *Deleted

## 2015-12-26 NOTE — Telephone Encounter (Signed)
No problems post procedure. 

## 2015-12-27 ENCOUNTER — Other Ambulatory Visit: Payer: Self-pay | Admitting: Pain Medicine

## 2015-12-29 ENCOUNTER — Encounter: Payer: Self-pay | Admitting: Pain Medicine

## 2015-12-29 ENCOUNTER — Ambulatory Visit: Payer: PPO | Admitting: Primary Care

## 2016-01-05 ENCOUNTER — Other Ambulatory Visit: Payer: Self-pay | Admitting: Pain Medicine

## 2016-01-09 ENCOUNTER — Ambulatory Visit (INDEPENDENT_AMBULATORY_CARE_PROVIDER_SITE_OTHER): Payer: PPO | Admitting: Primary Care

## 2016-01-09 ENCOUNTER — Encounter: Payer: Self-pay | Admitting: Primary Care

## 2016-01-09 VITALS — BP 110/74 | HR 81 | Temp 98.4°F | Ht 60.0 in | Wt 142.1 lb

## 2016-01-09 DIAGNOSIS — G4700 Insomnia, unspecified: Secondary | ICD-10-CM | POA: Diagnosis not present

## 2016-01-09 DIAGNOSIS — F329 Major depressive disorder, single episode, unspecified: Secondary | ICD-10-CM

## 2016-01-09 DIAGNOSIS — R5383 Other fatigue: Secondary | ICD-10-CM

## 2016-01-09 DIAGNOSIS — K5900 Constipation, unspecified: Secondary | ICD-10-CM

## 2016-01-09 DIAGNOSIS — M47816 Spondylosis without myelopathy or radiculopathy, lumbar region: Secondary | ICD-10-CM

## 2016-01-09 DIAGNOSIS — F32A Depression, unspecified: Secondary | ICD-10-CM

## 2016-01-09 DIAGNOSIS — K5909 Other constipation: Secondary | ICD-10-CM | POA: Insufficient documentation

## 2016-01-09 DIAGNOSIS — M545 Low back pain: Secondary | ICD-10-CM

## 2016-01-09 MED ORDER — TRAZODONE HCL 50 MG PO TABS: 50.0000 mg | ORAL_TABLET | Freq: Every day | ORAL | Status: DC

## 2016-01-09 NOTE — Assessment & Plan Note (Signed)
Managed with Dr. Primus Bravo, due again March 20th.

## 2016-01-09 NOTE — Assessment & Plan Note (Addendum)
Managed on Linzess per GI for constipation. Has been taking irregularly due to lack of affordability.  She is now back on meds and encouraged her to call GI if no improvement.

## 2016-01-09 NOTE — Progress Notes (Signed)
Subjective:    Patient ID: Karen Edwards, female    DOB: Feb 10, 1966, 50 y.o.   MRN: [id number]  HPI  Karen Edwards is a 50 year old female who presents today for follow up.   1) Depression: Currently managed on Fluoxetine 60 mg once daily and Trazodone 200 mg HS. She feels well managed on the Fluoxetine but is noticing difficulty sleeping due to racing thoughts at night. She will have difficulty falling asleep and staying asleep. She noticed difficulty with sleeping since late January 2017. Her last increase in Trazodone was years ago. She does endorse worry as her husband has recently been evaluated and has a few health problems.   2) Chronic Constipation: Currently managed on Linzess daily that she's been taking for 2 months. She had recently been out of her medication as it was too expensive, but has been back on her meds for 4 days. She had bowel movements daily when she initially began this medication, but has not had a bowel movement since restarting her Linzess 4 days ago. Denies abdominal pain, nausea.   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 Primus Bravo). Next follow up scheduled on March 20th. Feels well managed except that she is now on Baclofen as Tizanidine was too expensive.   4) Fatigue: Diagnosed with the Flu in February 2017. She's felt fatigued since her diagnosis. She continues to have mild cough and congestion in her chest, but is overall feeling improved. Denies fevers, shortness of breath. She was prescribed Tamiflu and completed this course. Denies dizziness, chest pain, cold intolerance, hair loss, weakness. She has no history of hypothyroidism.   Review of Systems  Constitutional: Positive for fatigue. Negative for fever and chills.  HENT: Positive for congestion.   Respiratory: Positive for cough. Negative for shortness of breath.   Cardiovascular: Negative for chest pain.  Neurological: Negative for dizziness.  Psychiatric/Behavioral: Positive for  sleep disturbance. Negative for suicidal ideas. The patient is nervous/anxious.        Past Medical History  Diagnosis Date  . MVP (mitral valve prolapse)   . Depression   . Chronic back pain   . Hypotension   . IBS (irritable bowel syndrome) 05/2015    Social History   Social History  . Marital Status: Married    Spouse Name: N/A  . Number of Children: N/A  . Years of Education: N/A   Occupational History  . Not on file.   Social History Main Topics  . Smoking status: Current Every Day Smoker -- 0.50 packs/day    Types: Cigarettes  . Smokeless tobacco: Not on file  . Alcohol Use: 0.6 oz/week    1 Cans of beer, 0 Standard drinks or equivalent per week     Comment: drinks an occasional beer  . Drug Use: No  . Sexual Activity: Yes   Other Topics Concern  . Not on file   Social History Narrative   Married.   2 children. One boy and one girl.   On disability.   Playing with her dogs, gardening.       Past Surgical History  Procedure Laterality Date  . Hysteroscopy    . Dilation and curettage of uterus    . Cesarean section      x 2  . Other surgical history Right 2015    ankle  . Ankle surgery Right 2015  . Hemorrhoid surgery N/A 06/16/2015    Procedure: HEMORRHOIDECTOMY;  Surgeon: Leonie Green, MD;  Location: [facility] ORS;  Service: General;  Laterality: N/A;    Family History  Problem Relation Age of Onset  . Arthritis Mother   . Depression Mother   . Hyperlipidemia Mother   . Hypertension Mother   . Kidney disease Mother   . Mental illness Mother   . Vision loss Mother   . Varicose Veins Mother   . Hearing loss Father   . Hyperlipidemia Father   . Hypertension Father     Allergies  Allergen Reactions  . No Known Allergies     Current Outpatient Prescriptions on File Prior to Visit  Medication Sig Dispense Refill  . baclofen (LIORESAL) 10 MG tablet Limit 1 tablet by mouth per day or 2-3 times per day if tolerated.   NO ZANAFLEX  (TIZANIDINE) 80 each 0  . FLUoxetine (PROZAC) 20 MG capsule Take 1 capsule (20 mg total) by mouth 3 (three) times daily. 90 capsule 5  . HYDROcodone-acetaminophen (NORCO/VICODIN) 5-325 MG tablet Limit 1 tab by mouth per day or twice per day if tolerated 60 tablet 0  . Linaclotide (LINZESS) 145 MCG CAPS capsule Take by mouth. Reported on 12/14/2015    . medroxyPROGESTERone (PROVERA) 10 MG tablet Take by mouth.    . traZODone (DESYREL) 100 MG tablet Take 2 tablets (200 mg total) by mouth at bedtime. 60 tablet 5  . vitamin B-12 (CYANOCOBALAMIN) 50 MCG tablet Take 50 mcg by mouth every morning.      Current Facility-Administered Medications on File Prior to Visit  Medication Dose Route Frequency Provider Last Rate Last Dose  . bupivacaine (PF) (MARCAINE) 0.25 % injection 30 mL  30 mL Other Once Mohammed Kindle, MD      . bupivacaine (PF) (MARCAINE) 0.25 % injection 30 mL  30 mL Other Once Mohammed Kindle, MD      . bupivacaine (PF) (MARCAINE) 0.25 % injection 30 mL  30 mL Other Once Mohammed Kindle, MD      . fentaNYL (SUBLIMAZE) injection 100 mcg  100 mcg Intravenous Once Mohammed Kindle, MD      . fentaNYL (SUBLIMAZE) injection 100 mcg  100 mcg Intravenous Once Mohammed Kindle, MD      . fentaNYL (SUBLIMAZE) injection 100 mcg  100 mcg Intravenous Once Mohammed Kindle, MD      . lactated ringers infusion 1,000 mL  1,000 mL Intravenous Continuous Mohammed Kindle, MD      . lactated ringers infusion 1,000 mL  1,000 mL Intravenous Continuous Mohammed Kindle, MD      . lactated ringers infusion 1,000 mL  1,000 mL Intravenous Continuous Mohammed Kindle, MD      . lactated ringers infusion 1,000 mL  1,000 mL Intravenous Continuous Mohammed Kindle, MD      . lidocaine (PF) (XYLOCAINE) 1 % injection 10 mL  10 mL Subcutaneous Once Mohammed Kindle, MD      . lidocaine (PF) (XYLOCAINE) 1 % injection 10 mL  10 mL Subcutaneous Once Mohammed Kindle, MD      . lidocaine (PF) (XYLOCAINE) 1 % injection 10 mL  10 mL Subcutaneous Once  Mohammed Kindle, MD      . midazolam (VERSED) 5 MG/5ML injection 5 mg  5 mg Intravenous Once Mohammed Kindle, MD      . midazolam (VERSED) 5 MG/5ML injection 5 mg  5 mg Intravenous Once Mohammed Kindle, MD      . midazolam (VERSED) 5 MG/5ML injection 5 mg  5 mg Intravenous Once Mohammed Kindle, MD      .  orphenadrine (NORFLEX) injection 60 mg  60 mg Intramuscular Once Mohammed Kindle, MD      . orphenadrine (NORFLEX) injection 60 mg  60 mg Intramuscular Once Mohammed Kindle, MD      . sodium chloride flush (NS) 0.9 % injection 20 mL  20 mL Other Once Mohammed Kindle, MD      . triamcinolone acetonide (KENALOG-40) injection 40 mg  40 mg Other Once Mohammed Kindle, MD      . triamcinolone acetonide [facility]) injection 40 mg  40 mg Other Once Mohammed Kindle, MD      . triamcinolone acetonide (KENALOG-40) injection 40 mg  40 mg Other Once Mohammed Kindle, MD        BP 110/74 mmHg  Pulse 81  Temp(Src) 98.4 F (36.9 C) (Oral)  Ht 5' (1.524 m)  Wt 142 lb 1.9 oz (64.465 kg)  BMI 27.76 kg/m2  SpO2 97%    Objective:   Physical Exam  Constitutional: She appears well-nourished.  Neck: Neck supple. No thyromegaly present.  Cardiovascular: Normal rate and regular rhythm.   Pulmonary/Chest: Effort normal and breath sounds normal.  Skin: Skin is warm and dry.  Psychiatric: She has a normal mood and affect.          Assessment & Plan:

## 2016-01-09 NOTE — Progress Notes (Signed)
Pre visit review using our clinic review tool, if applicable. No additional management support is needed unless otherwise documented below in the visit note. 

## 2016-01-09 NOTE — Assessment & Plan Note (Signed)
Present since flu diagnosis several weeks ago. Overall decrease in energy. Suspect this is residual from recent illness, however, will rule out other causes. CBC, TSH, Vitamin D and B 12 pending. Could be increased anxiety and lack of sleep. Increased trazodone to 250 mg today. Exam unremarkable.

## 2016-01-09 NOTE — Assessment & Plan Note (Signed)
Feels well managed on Fluoxetine 60 mg, although does endorse fatigue. Will rule out any metabolic cause for fatigue and continue to monitor.  Trazodone increased to 250 mg to help with insomnia. Will continue to monitor.

## 2016-01-09 NOTE — Patient Instructions (Addendum)
Complete lab work prior to leaving today. I will notify you of your results once received.   Call Dr. Tamala Julian if no improvement in your constipation on the Linzess in 3-4 days.  We've increased your Trazodone to 250 mg. I sent in a 50 mg tablet. Take this tablet with your dose at bedtime. Please e-mail me in 4 weeks with an updated.  Please schedule a physical with me in 6 months. You may also schedule a lab only appointment 3-4 days prior. We will discuss your lab results in detail during your physical.  It was a pleasure to see you today!

## 2016-01-09 NOTE — Assessment & Plan Note (Signed)
Long history of.  Currently managed on Trazodone 200 mg HS. Increase to 250 today due to lack of sleep since January 2017. Will continue to monitor and encouraged her to send me an update in 3-4 weeks.

## 2016-01-10 LAB — COMPREHENSIVE METABOLIC PANEL
ALBUMIN: 4.4 g/dL (ref 3.5–5.2)
ALT: 15 U/L (ref 0–35)
AST: 17 U/L (ref 0–37)
Alkaline Phosphatase: 61 U/L (ref 39–117)
BUN: 7 mg/dL (ref 6–23)
CHLORIDE: 102 meq/L (ref 96–112)
CO2: 28 meq/L (ref 19–32)
CREATININE: 0.78 mg/dL (ref 0.40–1.20)
Calcium: 9.7 mg/dL (ref 8.4–10.5)
GFR: 83.1 mL/min (ref >60.00)
GLUCOSE: 96 mg/dL (ref 70–99)
POTASSIUM: 4.4 meq/L (ref 3.5–5.1)
SODIUM: 138 meq/L (ref 135–145)
Total Bilirubin: 0.4 mg/dL (ref 0.2–1.2)
Total Protein: 7.2 g/dL (ref 6.0–8.3)

## 2016-01-10 LAB — CBC
HCT: 40.1 % (ref 36.0–46.0)
Hemoglobin: 13.4 g/dL (ref 12.0–15.0)
MCHC: 33.5 g/dL (ref 30.0–36.0)
MCV: 91.9 fl (ref 78.0–100.0)
PLATELETS: 218 10*3/uL (ref 150.0–400.0)
RBC: 4.36 Mil/uL (ref 3.87–5.11)
RDW: 14 % (ref 11.5–15.5)
WBC: 11 10*3/uL — ABNORMAL HIGH (ref 4.0–10.5)

## 2016-01-10 LAB — VITAMIN D 25 HYDROXY (VIT D DEFICIENCY, FRACTURES): VITD: 43.74 ng/mL (ref 30.00–100.00)

## 2016-01-10 LAB — TSH: TSH: 1.18 u[IU]/mL (ref 0.35–4.50)

## 2016-01-10 LAB — VITAMIN B12: Vitamin B-12: 186 pg/mL — ABNORMAL LOW (ref 211–911)

## 2016-01-11 ENCOUNTER — Telehealth: Payer: Self-pay | Admitting: Primary Care

## 2016-01-11 NOTE — Telephone Encounter (Signed)
Patient called to get the results of her lab work.

## 2016-01-11 NOTE — Telephone Encounter (Signed)
Called and notified patient of Kate's comments. Patient verbalized understanding.  

## 2016-01-12 ENCOUNTER — Encounter: Payer: Self-pay | Admitting: Primary Care

## 2016-01-15 ENCOUNTER — Encounter: Payer: Self-pay | Admitting: Pain Medicine

## 2016-01-15 ENCOUNTER — Ambulatory Visit: Payer: PPO | Attending: Pain Medicine | Admitting: Pain Medicine

## 2016-01-15 VITALS — BP 106/65 | HR 79 | Temp 98.6°F | Resp 18 | Ht 61.0 in | Wt 140.0 lb

## 2016-01-15 DIAGNOSIS — G894 Chronic pain syndrome: Secondary | ICD-10-CM

## 2016-01-15 DIAGNOSIS — M47816 Spondylosis without myelopathy or radiculopathy, lumbar region: Secondary | ICD-10-CM

## 2016-01-15 DIAGNOSIS — M5030 Other cervical disc degeneration, unspecified cervical region: Secondary | ICD-10-CM | POA: Diagnosis not present

## 2016-01-15 DIAGNOSIS — M47896 Other spondylosis, lumbar region: Secondary | ICD-10-CM | POA: Diagnosis not present

## 2016-01-15 DIAGNOSIS — M79602 Pain in left arm: Secondary | ICD-10-CM | POA: Diagnosis not present

## 2016-01-15 DIAGNOSIS — M4802 Spinal stenosis, cervical region: Secondary | ICD-10-CM | POA: Insufficient documentation

## 2016-01-15 DIAGNOSIS — M50221 Other cervical disc displacement at C4-C5 level: Secondary | ICD-10-CM | POA: Insufficient documentation

## 2016-01-15 DIAGNOSIS — M5481 Occipital neuralgia: Secondary | ICD-10-CM

## 2016-01-15 DIAGNOSIS — M5126 Other intervertebral disc displacement, lumbar region: Secondary | ICD-10-CM | POA: Diagnosis not present

## 2016-01-15 DIAGNOSIS — M533 Sacrococcygeal disorders, not elsewhere classified: Secondary | ICD-10-CM | POA: Insufficient documentation

## 2016-01-15 DIAGNOSIS — G90523 Complex regional pain syndrome I of lower limb, bilateral: Secondary | ICD-10-CM

## 2016-01-15 DIAGNOSIS — M5416 Radiculopathy, lumbar region: Secondary | ICD-10-CM

## 2016-01-15 DIAGNOSIS — G8929 Other chronic pain: Secondary | ICD-10-CM

## 2016-01-15 DIAGNOSIS — M5116 Intervertebral disc disorders with radiculopathy, lumbar region: Secondary | ICD-10-CM | POA: Diagnosis not present

## 2016-01-15 DIAGNOSIS — M542 Cervicalgia: Secondary | ICD-10-CM | POA: Diagnosis not present

## 2016-01-15 DIAGNOSIS — M5137 Other intervertebral disc degeneration, lumbosacral region: Secondary | ICD-10-CM

## 2016-01-15 DIAGNOSIS — M791 Myalgia: Secondary | ICD-10-CM | POA: Diagnosis not present

## 2016-01-15 DIAGNOSIS — M545 Low back pain: Secondary | ICD-10-CM

## 2016-01-15 DIAGNOSIS — M5010 Cervical disc disorder with radiculopathy, unspecified cervical region: Secondary | ICD-10-CM

## 2016-01-15 DIAGNOSIS — M79601 Pain in right arm: Secondary | ICD-10-CM | POA: Diagnosis not present

## 2016-01-15 DIAGNOSIS — G90521 Complex regional pain syndrome I of right lower limb: Secondary | ICD-10-CM

## 2016-01-15 DIAGNOSIS — M5136 Other intervertebral disc degeneration, lumbar region: Secondary | ICD-10-CM

## 2016-01-15 DIAGNOSIS — M47817 Spondylosis without myelopathy or radiculopathy, lumbosacral region: Secondary | ICD-10-CM | POA: Diagnosis not present

## 2016-01-15 MED ORDER — BACLOFEN 10 MG PO TABS: ORAL_TABLET | ORAL | Status: DC

## 2016-01-15 MED ORDER — HYDROCODONE-ACETAMINOPHEN 5-325 MG PO TABS: ORAL_TABLET | ORAL | Status: DC

## 2016-01-15 NOTE — Patient Instructions (Addendum)
PLAN   Continue present medication hydrocodone acetaminophen and begin baclofen. Do not take Zanaflex (TIZANIDINE)   Lumbosacral selective nerve root block to be performed at time of return appointment  F/U PCP Dr. Carlis Abbott for evaliation of  BP and general medical condition  F/U with neurosurgeon as discussed   Ask the nurses and secretary's the date of your lumbar MRI  F/U neurological evaluation. May consider pending follow-up evaluation  May consider radiofrequency rhizolysis or intraspinal procedures pending response to present treatment and F/U evaluation  Patient to call pain management for any change in condition or concerns prior to scheduled appointment Selective Nerve Root Block Patient Information  Description: Specific nerve roots exit the spinal canal and these nerves can be compressed and inflamed by a bulging disc and bone spurs.  By injecting steroids on the nerve root, we can potentially decrease the inflammation surrounding these nerves, which often leads to decreased pain.  Also, by injecting local anesthesia on the nerve root, this can provide [place] helpful information to give to your referring doctor if it decreases your pain.  Selective nerve root blocks can be done along the spine from the neck to the low back depending on the location of your pain.   After numbing the skin with local anesthesia, a small needle is passed to the nerve root and the position of the needle is verified using x-ray pictures.  After the needle is in correct position, we then deposit the medication.  You may experience a pressure sensation while this is being done.  The entire block usually lasts less than 15 minutes.  Conditions that may be treated with selective nerve root blocks:  Low back and leg pain  Spinal stenosis  Diagnostic block prior to potential surgery  Neck and arm pain  Post laminectomy syndrome  Preparation for the injection:  1. Do not eat any solid food or dairy  products within 8 hours of your appointment. 2. You may drink clear liquids up to 3 hours before an appointment.  Clear liquids include water, black coffee, juice or soda.  No milk or cream please. 3. You may take your regular medications, including pain medications, with a sip of water before your appointment.  Diabetics should hold regular insulin (if taken separately) and take 1/2 normal NPH dose the morning of the procedure.  Carry some sugar containing items with you to your appointment. 4. A driver must accompany you and be prepared to drive you home after your procedure. 5. Bring all your current medications with you. 6. An IV may be inserted and sedation may be given at the discretion of the physician. 7. A blood pressure cuff, EKG, and other monitors will often be applied during the procedure.  Some patients may need to have extra oxygen administered for a short period. 8. You will be asked to provide medical information, including allergies, prior to the procedure.  We must know immediately if you are taking blood  Thinners (like Coumadin) or if you are allergic to IV iodine contrast (dye).  Possible side-effects: All are usually temporary  Bleeding from needle site  Light headedness  Numbness and tingling  Decreased blood pressure  Weakness in arms/legs  Pressure sensation in back/neck  Pain at injection site (several days)  Possible complications: All are extremely rare  Infection  Nerve injury  Spinal headache (a headache wore with upright position)  Call if you experience:  Fever/chills associated with headache or increased back/neck pain  Headache worsened by  an upright position  New onset weakness or numbness of an extremity below the injection site  Hives or difficulty breathing (go to the emergency room)  Inflammation or drainage at the injection site(s)  Severe back/neck pain greater than usual  New symptoms which are concerning to you  Please  note:  Although the local anesthetic injected can often make your back or neck feel good for several hours after the injection the pain will likely return.  It takes 3-5 days for steroids to work on the nerve root. You may not notice any pain relief for at least one week.  If effective, we will often do a series of 3 injections spaced 3-6 weeks apart to maximally decrease your pain.    If you have any questions, please call [phone] [facility] R[facility] AND COMPLICATIONS  What are the risk, side effects and possible complications? Generally speaking, most procedures are safe.  However, with any procedure there are risks, side effects, and the possibility of complications.  The risks and complications are dependent upon the sites that are lesioned, or the type of nerve block to be performed.  The closer the procedure is to the spine, the more serious the risks are.  Great care is taken when placing the radio frequency needles, block needles or lesioning probes, but sometimes complications can occur. 1. Infection: Any time there is an injection through the skin, there is a risk of infection.  This is why sterile conditions are used for these blocks.  There are four possible types of infection. 1. Localized skin infection. 2. Central Nervous System Infection-This can be in the form of Meningitis, which can be deadly. 3. Epidural Infections-This can be in the form of an epidural abscess, which can cause pressure inside of the spine, causing compression of the spinal cord with subsequent paralysis. This would require an emergency surgery to decompress, and there are no guarantees that the patient would recover from the paralysis. 4. Discitis-This is an infection of the intervertebral discs.  It occurs in about 1% of discography procedures.  It is difficult to treat and it may lead to surgery.        2. Pain: the needles have to go through skin and soft tissues,  will cause soreness.       3. Damage to internal structures:  The nerves to be lesioned may be near blood vessels or    other nerves which can be potentially damaged.       4. Bleeding: Bleeding is more common if the patient is taking blood thinners such as  aspirin, Coumadin, Ticiid, Plavix, etc., or if he/she have some genetic predisposition  such as hemophilia. Bleeding into the spinal canal can cause compression of the spinal  cord with subsequent paralysis.  This would require an emergency surgery to  decompress and there are no guarantees that the patient would recover from the  paralysis.       5. Pneumothorax:  Puncturing of a lung is a possibility, every time a needle is introduced in  the area of the chest or upper back.  Pneumothorax refers to free air around the  collapsed lung(s), inside of the thoracic cavity (chest cavity).  Another two possible  complications related to a similar event would include: Hemothorax and Chylothorax.   These are variations of the Pneumothorax, where instead of air around the collapsed  lung(s), you may have blood or chyle, respectively.  6. Spinal headaches: They may occur with any procedures in the area of the spine.       7. Persistent CSF (Cerebro-Spinal Fluid) leakage: This is a rare problem, but may occur  with prolonged intrathecal or epidural catheters either due to the formation of a fistulous  track or a dural tear.       8. Nerve damage: By working so close to the spinal cord, there is always a possibility of  nerve damage, which could be as serious as a permanent spinal cord injury with  paralysis.       9. Death:  Although rare, severe deadly allergic reactions known as "Anaphylactic  reaction" can occur to any of the medications used.      10. Worsening of the symptoms:  We can always make thing worse.  What are the chances of something like this happening? Chances of any of this occuring are extremely low.  By statistics, you have more of a  chance of getting killed in a motor vehicle accident: while driving to the hospital than any of the above occurring .  Nevertheless, you should be aware that they are possibilities.  In general, it is similar to taking a shower.  Everybody knows that you can slip, hit your head and get killed.  Does that mean that you should not shower again?  Nevertheless always keep in mind that statistics do not mean anything if you happen to be on the wrong side of them.  Even if a procedure has a 1 (one) in a 1,000,000 (million) chance of going wrong, it you happen to be that one..Also, keep in mind that by statistics, you have more of a chance of having something go wrong when taking medications.  Who should not have this procedure? If you are on a blood thinning medication (e.g. Coumadin, Plavix, see list of "Blood Thinners"), or if you have an active infection going on, you should not have the procedure.  If you are taking any blood thinners, please inform your physician.  How should I prepare for this procedure?  Do not eat or drink anything at least six hours prior to the procedure.  Bring a driver with you .  It cannot be a taxi.  Come accompanied by an adult that can drive you back, and that is strong enough to help you if your legs get weak or numb from the local anesthetic.  Take all of your medicines the morning of the procedure with just enough water to swallow them.  If you have diabetes, make sure that you are scheduled to have your procedure done first thing in the morning, whenever possible.  If you have diabetes, take only half of your insulin dose and notify our nurse that you have done so as soon as you arrive at the clinic.  If you are diabetic, but only take blood sugar pills (oral hypoglycemic), then do not take them on the morning of your procedure.  You may take them after you have had the procedure.  Do not take aspirin or any aspirin-containing medications, at least eleven (11) days  prior to the procedure.  They may prolong bleeding.  Wear loose fitting clothing that may be easy to take off and that you would not mind if it got stained with Betadine or blood.  Do not wear any jewelry or perfume  Remove any nail coloring.  It will interfere with some of our monitoring equipment.  NOTE: Remember that this is  not meant to be interpreted as a complete list of all possible complications.  Unforeseen problems may occur.  BLOOD THINNERS The following drugs contain aspirin or other products, which can cause increased bleeding during surgery and should not be taken for 2 weeks prior to and 1 week after surgery.  If you should need take something for relief of minor pain, you may take acetaminophen which is found in Tylenol,m Datril, Anacin-3 and Panadol. It is not blood thinner. The products listed below are.  Do not take any of the products listed below in addition to any listed on your instruction sheet.  A.P.C or A.P.C with Codeine Codeine Phosphate Capsules #3 Ibuprofen Ridaura  ABC compound Congesprin Imuran rimadil  Advil Cope Indocin Robaxisal  Alka-Seltzer Effervescent Pain Reliever and Antacid Coricidin or Coricidin-D  Indomethacin Rufen  Alka-Seltzer plus Cold Medicine Cosprin Ketoprofen S-A-C Tablets  Anacin Analgesic Tablets or Capsules Coumadin Korlgesic Salflex  Anacin Extra Strength Analgesic tablets or capsules CP-2 Tablets Lanoril Salicylate  Anaprox Cuprimine Capsules Levenox Salocol  Anexsia-D Dalteparin Magan Salsalate  Anodynos Darvon compound Magnesium Salicylate Sine-off  Ansaid Dasin Capsules Magsal Sodium Salicylate  Anturane Depen Capsules Marnal Soma  APF Arthritis pain formula Dewitt's Pills Measurin Stanback  Argesic Dia-Gesic Meclofenamic Sulfinpyrazone  Arthritis Bayer Timed Release Aspirin Diclofenac Meclomen Sulindac  Arthritis pain formula Anacin Dicumarol Medipren Supac  Analgesic (Safety coated) Arthralgen Diffunasal Mefanamic Suprofen   Arthritis Strength Bufferin Dihydrocodeine Mepro Compound Suprol  Arthropan liquid Dopirydamole Methcarbomol with Aspirin Synalgos  ASA tablets/Enseals Disalcid Micrainin Tagament  Ascriptin Doan's Midol Talwin  Ascriptin A/D Dolene Mobidin Tanderil  Ascriptin Extra Strength Dolobid Moblgesic Ticlid  Ascriptin with Codeine Doloprin or Doloprin with Codeine Momentum Tolectin  Asperbuf Duoprin Mono-gesic Trendar  Aspergum Duradyne Motrin or Motrin IB Triminicin  Aspirin plain, buffered or enteric coated Durasal Myochrisine Trigesic  Aspirin Suppositories Easprin Nalfon Trillsate  Aspirin with Codeine Ecotrin Regular or Extra Strength Naprosyn Uracel  Atromid-S Efficin Naproxen Ursinus  Auranofin Capsules Elmiron Neocylate Vanquish  Axotal Emagrin Norgesic Verin  Azathioprine Empirin or Empirin with Codeine Normiflo Vitamin E  Azolid Emprazil Nuprin Voltaren  Bayer Aspirin plain, buffered or children's or timed BC Tablets or powders Encaprin Orgaran Warfarin Sodium  Buff-a-Comp Enoxaparin Orudis Zorpin  Buff-a-Comp with Codeine Equegesic Os-Cal-Gesic   Buffaprin Excedrin plain, buffered or Extra Strength Oxalid   Bufferin Arthritis Strength Feldene Oxphenbutazone   Bufferin plain or Extra Strength Feldene Capsules Oxycodone with Aspirin   Bufferin with Codeine Fenoprofen Fenoprofen Pabalate or Pabalate-SF   Buffets II Flogesic Panagesic   Buffinol plain or Extra Strength Florinal or Florinal with Codeine Panwarfarin   Buf-Tabs Flurbiprofen Penicillamine   Butalbital Compound Four-way cold tablets Penicillin   Butazolidin Fragmin Pepto-Bismol   Carbenicillin Geminisyn Percodan   Carna Arthritis Reliever Geopen Persantine   Carprofen Gold's salt Persistin   Chloramphenicol Goody's Phenylbutazone   Chloromycetin Haltrain Piroxlcam   Clmetidine heparin Plaquenil   Cllnoril Hyco-pap Ponstel   Clofibrate Hydroxy chloroquine Propoxyphen         Before stopping any of these medications,  be sure to consult the physician who ordered them.  Some, such as Coumadin (Warfarin) are ordered to prevent or treat serious conditions such as "deep thrombosis", "pumonary embolisms", and other heart problems.  The amount of time that you may need off of the medication may also vary with the medication and the reason for which you were taking it.  If you are taking any of these medications, please make sure  you notify your pain physician before you undergo any procedures.

## 2016-01-15 NOTE — Progress Notes (Signed)
Safety precautions to be maintained throughout the outpatient stay will include: orient to surroundings, keep bed in low position, maintain call bell within reach at all times, provide assistance with transfer out of bed and ambulation.  

## 2016-01-15 NOTE — Progress Notes (Signed)
Subjective:    Patient ID: Karen Edwards, female    DOB: 07/07/1966, 50 y.o.   MRN: [id number]  HPI  The patient is a 50 year old female who returns to pain management for further evaluation and treatment of pain involving the neck upper extremity regions with weakness of the right upper extremity as well as lower back and lower extremity pain. The patient is scheduled to undergo neurosurgical evaluation. We discussed patient's condition and will update the lumbar MRI. The patient has significant pain involving the neck upper extremity regions as well as the lower back and lower extremity region. The patient has had improvement of her lower back and lower extremity pain with lumbosacral selective nerve root block and states that she is in hopes of being able to undergo procedure at time of return appointment. We discussed patient's condition and informed patient that we would like to obtain disposition of the neurosurgeon and we will schedule patient for lumbosacral selective nerve root block and may change plan of treatment once we at the disposition of the neurosurgeon and discuss disposition with the patient. The patient was with understanding and agreed to suggested treatment plan. The patient asked that we schedule lumbosacral selective nerve root block at this time in the event that the surgeon's disposition would be without any significant effect on treatment plan. All agreed to suggested treatment plan. We will continue baclofen and hydrocodone as prescribed. We  will consider interventional treatment pending surgical disposition and follow-up evaluation in pain management           Review of Systems     Objective:   Physical Exam    There was tenderness of the splenius capitis and occipitalis musculature region a moderate degree there was moderate tenderness of the cervical facet cervical paraspinal musculature region. Palpation of the acromioclavicular and glenohumeral joint  regions reproduce mild discomfort to moderate discomfort on the right. The patient was with decreased grip strength on the right compared to the left and Tinel and Phalen's maneuver were without increase of pain of significant degree. There was questionably positive Spurling's maneuver. Palpation over the thoracic facet thoracic paraspinal musculature region was with no crepitus of the thoracic region noted. There was moderate muscle spasms noted in the thoracic region upper mid and lower regions. Palpation over the lumbar facet region lumbar paraspinal must reason was attends to palpation of moderate degree with lateral bending rotation extension and palpation of the lumbar facets reproducing moderate discomfort. There was mild to moderate tenderness of the PSIS and PII S region and straight leg raising was tolerates approximately 2020 without a definite increased pain with dorsiflexion noted. EHL strength was questionably decreased. There was minimal tenderness of the greater trochanteric region and iliotibial band region There was negative clonus negative Homans. Abdomen was nontender with no costovertebral tenderness noted       Assessment & Plan:        Subjective:    Patient ID: Karen Edwards, female    DOB: December 03, 1965, 50 y.o.   MRN: [id number]  HPI The patient is a 50 year old female who returns to pain management for further evaluation and treatment of pain involving the lumbar and lower extremity region. The patient continues to have lumbar and lower extremity pain with pain radiating to the right lower extremity greater than the left lower extremity. The patient states she has had significant improvement with previous lumbosacral selective nerve root blocks. We will also discuss pain involving the neck and upper extremity  region and patient is to undergo surgical evaluation of pain of the cervical and upper extremity regions as well as the lumbar and lower extremity regions. The patient  wishes to proceed with interventional treatment at the present time to decrease severely disabling lumbar lower extremity pain. We will continue medications consisting of Zanaflex and hydrocodone and will consider additional medications of treatment pending follow-up evaluation. The patient was with understanding and wished to proceed with lumbosacral selective nerve root block at time return appointment as discussed. All agreed to suggested treatment plan the patient denied any trauma change in events of daily living the call significant change in symptomatology.   Review of Systems     Objective:   Physical Exam   There was tenderness of the splenius capitis and occipitalis musculature regions a patient of the reproduced pain of moderate degree. There was limited range of motion of the cervical spine without a definite positive finding on Spurling's maneuver. The patient was with slightly decreased grip strength and Tinel and Phalen's maneuver were without increase of pain of significant degree. There was tends to palpation of the acromioclavicular and glenohumeral joint region a mild degree. Palpation of the thoracic facet thoracic paraspinal must reason was attends to palpation of mild to moderate degree with muscle spasms noted in the upper mid and lower thoracic regions with no crepitus of the thoracic region noted. Palpation over the lumbar paraspinal musculature region lumbar facet region was with moderate discomfort with moderate muscle spasms of the lumbar paraspinal musculature region with increased pain with palpation over the lumbar facet region with lateral bending and rotation increasing pain was significantly. Straight leg raising was tolerates approximately 20 without increased pain with dorsiflexion noted. DTRs were difficult to elicit. There was negative clonus negative Homans. No definite sensory deficit or dermatomal dystrophy the lower extremities noted. EHL strength appeared to be  decreased on the right compared to the left. There was negative clonus negative Homans. 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 No spinal stenosis or nerve root encroachment.  L2-L3: Mild disc bulging. No spinal stenosis or nerve root encroachment.  L3-L4: Disc height and hydration are maintained. There is mild facet and ligamentous hypertrophy. No spinal stenosis or nerve root encroachment.  L4-L5: Mild disc bulging with mild facet and ligamentous hypertrophy. No spinal stenosis or nerve root encroachment.  L5-S1: Disc height and hydration are maintained. No spinal stenosis or nerve root encroachment.  IMPRESSION:  1. Small right paracentral disc protrusion at L1-L2. 2. Mild disc bulging and facet disease from L2-L3 through L4-L5. 3. No significant spinal stenosis or nerve root encroachment. 4. 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 C6-C7 broad-based disc herniation narrows the spinal canal but does not compress the cord shows foraminal extension. C5-C6 and C6-C7 abnormalities noted with C4-C5 changes apparently have worsened since prior study             PLAN   Continue present medication hydrocodone acetaminophen and begin baclofen. Do  not take Zanaflex (TIZANIDINE)   Lumbosacral selective nerve root block to be performed at time of return  appointment  F/U PCP Dr. Carlis Abbott for evaliation of  BP and general medical condition  F/U with neurosurgeon as discussed   Ask the nurses and secretary's the date of your lumbar MRI  F/U neurological evaluation. May consider pending follow-up evaluation  May consider radiofrequency rhizolysis or intraspinal procedures pending response to present treatment and F/U evaluation  Patient to call pain management for any change in condition or concerns prior to scheduled appointment

## 2016-01-25 ENCOUNTER — Encounter: Payer: Self-pay | Admitting: Pain Medicine

## 2016-01-26 DIAGNOSIS — Z5181 Encounter for therapeutic drug level monitoring: Secondary | ICD-10-CM | POA: Diagnosis not present

## 2016-01-26 DIAGNOSIS — R200 Anesthesia of skin: Secondary | ICD-10-CM | POA: Insufficient documentation

## 2016-01-26 DIAGNOSIS — G8929 Other chronic pain: Secondary | ICD-10-CM | POA: Insufficient documentation

## 2016-01-26 DIAGNOSIS — M4302 Spondylolysis, cervical region: Secondary | ICD-10-CM | POA: Diagnosis not present

## 2016-01-26 DIAGNOSIS — M542 Cervicalgia: Secondary | ICD-10-CM

## 2016-01-26 DIAGNOSIS — R202 Paresthesia of skin: Secondary | ICD-10-CM

## 2016-01-26 DIAGNOSIS — Z79899 Other long term (current) drug therapy: Secondary | ICD-10-CM | POA: Diagnosis not present

## 2016-01-26 DIAGNOSIS — M5020 Other cervical disc displacement, unspecified cervical region: Secondary | ICD-10-CM | POA: Diagnosis not present

## 2016-01-26 DIAGNOSIS — M47812 Spondylosis without myelopathy or radiculopathy, cervical region: Secondary | ICD-10-CM | POA: Diagnosis not present

## 2016-01-28 ENCOUNTER — Encounter: Payer: Self-pay | Admitting: Primary Care

## 2016-01-29 ENCOUNTER — Encounter: Payer: Self-pay | Admitting: Primary Care

## 2016-01-30 ENCOUNTER — Telehealth: Payer: Self-pay | Admitting: Primary Care

## 2016-01-30 NOTE — Telephone Encounter (Signed)
Called patient and was notified BP readings are  144/84 134/85 135/76 140/93 109/72 136/79

## 2016-01-30 NOTE — Telephone Encounter (Signed)
Will you please call Ms. [name] to get the blood pressure readings of her husband Karen Edwards? She's been trying to send them through My Chart but I cannot see them. Can you?

## 2016-01-30 NOTE — Telephone Encounter (Signed)
Noted. These are acceptable.

## 2016-02-02 ENCOUNTER — Encounter: Payer: Self-pay | Admitting: Primary Care

## 2016-02-05 ENCOUNTER — Encounter: Payer: Self-pay | Admitting: Pain Medicine

## 2016-02-05 ENCOUNTER — Ambulatory Visit: Payer: PPO | Attending: Pain Medicine | Admitting: Pain Medicine

## 2016-02-05 VITALS — BP 111/69 | HR 55 | Temp 97.8°F | Resp 14 | Ht 61.0 in | Wt 135.0 lb

## 2016-02-05 DIAGNOSIS — M5416 Radiculopathy, lumbar region: Secondary | ICD-10-CM

## 2016-02-05 DIAGNOSIS — M5126 Other intervertebral disc displacement, lumbar region: Secondary | ICD-10-CM | POA: Insufficient documentation

## 2016-02-05 DIAGNOSIS — M4802 Spinal stenosis, cervical region: Secondary | ICD-10-CM

## 2016-02-05 DIAGNOSIS — G90521 Complex regional pain syndrome I of right lower limb: Secondary | ICD-10-CM

## 2016-02-05 DIAGNOSIS — M79606 Pain in leg, unspecified: Secondary | ICD-10-CM | POA: Insufficient documentation

## 2016-02-05 DIAGNOSIS — G8929 Other chronic pain: Secondary | ICD-10-CM

## 2016-02-05 DIAGNOSIS — M5137 Other intervertebral disc degeneration, lumbosacral region: Secondary | ICD-10-CM

## 2016-02-05 DIAGNOSIS — M5030 Other cervical disc degeneration, unspecified cervical region: Secondary | ICD-10-CM

## 2016-02-05 DIAGNOSIS — M51379 Other intervertebral disc degeneration, lumbosacral region without mention of lumbar back pain or lower extremity pain: Secondary | ICD-10-CM

## 2016-02-05 DIAGNOSIS — M5010 Cervical disc disorder with radiculopathy, unspecified cervical region: Secondary | ICD-10-CM

## 2016-02-05 DIAGNOSIS — M51369 Other intervertebral disc degeneration, lumbar region without mention of lumbar back pain or lower extremity pain: Secondary | ICD-10-CM

## 2016-02-05 DIAGNOSIS — G90523 Complex regional pain syndrome I of lower limb, bilateral: Secondary | ICD-10-CM

## 2016-02-05 DIAGNOSIS — G894 Chronic pain syndrome: Secondary | ICD-10-CM

## 2016-02-05 DIAGNOSIS — M47816 Spondylosis without myelopathy or radiculopathy, lumbar region: Secondary | ICD-10-CM

## 2016-02-05 DIAGNOSIS — M5481 Occipital neuralgia: Secondary | ICD-10-CM

## 2016-02-05 DIAGNOSIS — M545 Low back pain: Secondary | ICD-10-CM | POA: Insufficient documentation

## 2016-02-05 DIAGNOSIS — M5136 Other intervertebral disc degeneration, lumbar region: Secondary | ICD-10-CM

## 2016-02-05 DIAGNOSIS — M533 Sacrococcygeal disorders, not elsewhere classified: Secondary | ICD-10-CM

## 2016-02-05 MED ORDER — TRIAMCINOLONE ACETONIDE 40 MG/ML IJ SUSP: 40.0000 mg | Freq: Once | INTRAMUSCULAR | Status: DC

## 2016-02-05 MED ORDER — LIDOCAINE HCL (PF) 1 % IJ SOLN: 10.0000 mL | Freq: Once | INTRAMUSCULAR | Status: DC

## 2016-02-05 MED ORDER — BUPIVACAINE HCL (PF) 0.25 % IJ SOLN: 30.0000 mL | Freq: Once | INTRAMUSCULAR | Status: DC

## 2016-02-05 MED ORDER — FENTANYL CITRATE (PF) 100 MCG/2ML IJ SOLN
INTRAMUSCULAR | Status: AC
  Administered 2016-02-05: 100 ug via INTRAVENOUS
  Filled 2016-02-05: qty 2

## 2016-02-05 MED ORDER — ORPHENADRINE CITRATE 30 MG/ML IJ SOLN: 60.0000 mg | Freq: Once | INTRAMUSCULAR | Status: DC

## 2016-02-05 MED ORDER — MIDAZOLAM HCL 5 MG/5ML IJ SOLN: 5.0000 mg | Freq: Once | INTRAMUSCULAR | Status: DC

## 2016-02-05 MED ORDER — MIDAZOLAM HCL 5 MG/5ML IJ SOLN
INTRAMUSCULAR | Status: AC
  Administered 2016-02-05: 100 mg via INTRAVENOUS
  Filled 2016-02-05: qty 5

## 2016-02-05 MED ORDER — FENTANYL CITRATE (PF) 100 MCG/2ML IJ SOLN: 100.0000 ug | Freq: Once | INTRAMUSCULAR | Status: DC

## 2016-02-05 MED ORDER — BUPIVACAINE HCL (PF) 0.25 % IJ SOLN
INTRAMUSCULAR | Status: AC
  Administered 2016-02-05: 13:00:00
  Filled 2016-02-05: qty 30

## 2016-02-05 MED ORDER — LACTATED RINGERS IV SOLN: 1000.0000 mL | INTRAVENOUS | Status: DC

## 2016-02-05 MED ORDER — TRIAMCINOLONE ACETONIDE 40 MG/ML IJ SUSP
INTRAMUSCULAR | Status: AC
  Administered 2016-02-05: 13:00:00
  Filled 2016-02-05: qty 1

## 2016-02-05 MED ORDER — ORPHENADRINE CITRATE 30 MG/ML IJ SOLN
INTRAMUSCULAR | Status: AC
  Administered 2016-02-05: 13:00:00
  Filled 2016-02-05: qty 2

## 2016-02-05 NOTE — Progress Notes (Signed)
Patient here for procedure d/t lower back pain. Safety precautions to be maintained throughout the outpatient stay will include: orient to surroundings, keep bed in low position, maintain call bell within reach at all times, provide assistance with transfer out of bed and ambulation.  

## 2016-02-05 NOTE — Progress Notes (Signed)
Subjective:    Patient ID: Karen Edwards, female    DOB: 05-08-1966, 50 y.o.   MRN: [id number]  HPI  PROCEDURE PERFORMED: Lumbosacral selective nerve root block   NOTE: The patient is a 50 y.o. female who returns to [facility] for further evaluation and treatment of pain involving the lumbar and lower extremity region. Studies consisting of MRI has revealed the patient to be with evidence of 1. Small right paracentral disc protrusion at L1-L2. 2. Mild disc bulging and facet disease from L2-L3 through L4-L5. 3. No significant spinal stenosis or nerve root encroachment. 4. Suspected uterine fibroids,. There is concern regarding intraspinal abnormalities contributing to the patient's symptomatology with concern regarding component of pain due to lumbar radiculopathy The risks, benefits, and expectations of the procedure have been explained to the patient who was understanding and in agreement with suggested treatment plan. We will proceed with interventional treatment as discussed and as explained to the patient. The patient is understanding and in agreement with suggested treatment plan.   DESCRIPTION OF PROCEDURE: Lumbosacral selective nerve root block with IV Versed, IV fentanyl conscious sedation, EKG, blood pressure, pulse, and pulse oximetry monitoring. The procedure was performed with the patient in the prone position under fluoroscopic guidance. With the patient in the prone position, Betadine prep of proposed entry site was performed. Local anesthetic skin wheal of proposed needle entry site was prepared with 1.5% plain lidocaine with AP view of the lumbosacral spine.   PROCEDURE #1: Needle placement at the right L 2 vertebral body: A 22 -gauge needle was inserted at the inferior border of the transverse process of the vertebral body with needle placed medial to the midline of the transverse process on AP view of the lumbosacral spine.   NEEDLE PLACEMENT AT  L3, L4, and  L5  VERTEBRAL BODY LEVELS  Needle  placement was accomplished at L3, L4, and L5  vertebral body levels on the right side exactly as was accomplished at the L2 to, L3, L4, and L5  vertebral body level  and utilizing the same technique and under fluoroscopic guidance.  PROCEDURE #4: Needle placement at the S1 foramen. With the patient in the prone position with Betadine prep of proposed entry site accomplished, the S1 foramen was visualized under fluoroscopic guidance with AP view of the lumbosacral spine with cephalad orientation of the fluoroscope with local anesthetic skin wheal of 1.5% lidocaine of proposed needle entry site prepared. A 22-gauge needle was inserted S1 foramen under fluoroscopic guidance eliciting paresthesias radiating from the buttocks to the lower extremity after which needle was slightly withdrawn.   Needle placement was then verified on lateral view at all levels with needle tip documented to be in the posterior superior quadrant of the intervertebral foramen of  L 2, L3, L4 and L5. Following negative aspiration for heme and CSF at each level, each level was injected with 3 mL of 0.25% bupivacaine with Kenalog.   Myoneural block injections of the gluteal musculature region Following Betadine prep of proposed entry site a 22-gauge needle was inserted in the gluteal musculature region and following negative aspiration 2 cc of 0.25% bupivacaine with Norflex was injected for myoneural block injection of the gluteal musculature region 4   The patient tolerated the procedure well.   A total of 10 mg of Kenalog was utilized for the procedure.    PLAN:  1. Medications: Will continue presently prescribed medications Baclofen and hydrocodone acetaminophen 2. The patient is to undergo follow-up  evaluation with PCP  K Clarkfor evaluation of blood pressure and general medical condition status post procedure performed on today's visit. 3. Surgical follow-up evaluation.. The patient will  undergo further neurosurgical evaluation as planned 4. Neurological evaluation. Has been addressed 5. May consider radiofrequency procedures, implantation type procedures and other treatment pending response to treatment and follow-up evaluation. 6. The patient has been advise do adhere to proper body mechanics and avoid activities which may aggravate condition. 7. The patient has been advised to call the [facility] prior to scheduled return appointment should there be significant change in the patient's condition or should the patient have other concerns regarding condition prior to scheduled return appointment.   Review of Systems     Objective:   Physical Exam        Assessment & Plan:

## 2016-02-05 NOTE — Patient Instructions (Addendum)
PLAN   Continue present medication hydrocodone acetaminophen and baclofen. Do not take Zanaflex (TIZANIDINE)  F/U PCP Dr. Carlis Abbott for evaliation of  BP and general medical condition  F/U with neurosurgeon as discussed and as planned  F/U neurological evaluation. May consider PNCV/EMG studies and other studies pending follow-up evaluation  May consider radiofrequency rhizolysis or intraspinal procedures pending response to present treatment and F/U evaluation  Patient to call pain management for any change in condition or concerns prior to scheduled appointment if necessaryGENERAL RISKS AND COMPLICATIONS  What are the risk, side effects and possible complications? Generally speaking, most procedures are safe.  However, with any procedure there are risks, side effects, and the possibility of complications.  The risks and complications are dependent upon the sites that are lesioned, or the type of nerve block to be performed.  The closer the procedure is to the spine, the more serious the risks are.  Great care is taken when placing the radio frequency needles, block needles or lesioning probes, but sometimes complications can occur. 1. Infection: Any time there is an injection through the skin, there is a risk of infection.  This is why sterile conditions are used for these blocks.  There are four possible types of infection. 1. Localized skin infection. 2. Central Nervous System Infection-This can be in the form of Meningitis, which can be deadly. 3. Epidural Infections-This can be in the form of an epidural abscess, which can cause pressure inside of the spine, causing compression of the spinal cord with subsequent paralysis. This would require an emergency surgery to decompress, and there are no guarantees that the patient would recover from the paralysis. 4. Discitis-This is an infection of the intervertebral discs.  It occurs in about 1% of discography procedures.  It is difficult to treat and  it may lead to surgery.        2. Pain: the needles have to go through skin and soft tissues, will cause soreness.       3. Damage to internal structures:  The nerves to be lesioned may be near blood vessels or    other nerves which can be potentially damaged.       4. Bleeding: Bleeding is more common if the patient is taking blood thinners such as  aspirin, Coumadin, Ticiid, Plavix, etc., or if he/she have some genetic predisposition  such as hemophilia. Bleeding into the spinal canal can cause compression of the spinal  cord with subsequent paralysis.  This would require an emergency surgery to  decompress and there are no guarantees that the patient would recover from the  paralysis.       5. Pneumothorax:  Puncturing of a lung is a possibility, every time a needle is introduced in  the area of the chest or upper back.  Pneumothorax refers to free air around the  collapsed lung(s), inside of the thoracic cavity (chest cavity).  Another two possible  complications related to a similar event would include: Hemothorax and Chylothorax.   These are variations of the Pneumothorax, where instead of air around the collapsed  lung(s), you may have blood or chyle, respectively.       6. Spinal headaches: They may occur with any procedures in the area of the spine.       7. Persistent CSF (Cerebro-Spinal Fluid) leakage: This is a rare problem, but may occur  with prolonged intrathecal or epidural catheters either due to the formation of a fistulous  track or a dural  tear.       8. Nerve damage: By working so close to the spinal cord, there is always a possibility of  nerve damage, which could be as serious as a permanent spinal cord injury with  paralysis.       9. Death:  Although rare, severe deadly allergic reactions known as "Anaphylactic  reaction" can occur to any of the medications used.      10. Worsening of the symptoms:  We can always make thing worse.  What are the chances of something like this  happening? Chances of any of this occuring are extremely low.  By statistics, you have more of a chance of getting killed in a motor vehicle accident: while driving to the hospital than any of the above occurring .  Nevertheless, you should be aware that they are possibilities.  In general, it is similar to taking a shower.  Everybody knows that you can slip, hit your head and get killed.  Does that mean that you should not shower again?  Nevertheless always keep in mind that statistics do not mean anything if you happen to be on the wrong side of them.  Even if a procedure has a 1 (one) in a 1,000,000 (million) chance of going wrong, it you happen to be that one..Also, keep in mind that by statistics, you have more of a chance of having something go wrong when taking medications.  Who should not have this procedure? If you are on a blood thinning medication (e.g. Coumadin, Plavix, see list of "Blood Thinners"), or if you have an active infection going on, you should not have the procedure.  If you are taking any blood thinners, please inform your physician.  How should I prepare for this procedure?  Do not eat or drink anything at least six hours prior to the procedure.  Bring a driver with you .  It cannot be a taxi.  Come accompanied by an adult that can drive you back, and that is strong enough to help you if your legs get weak or numb from the local anesthetic.  Take all of your medicines the morning of the procedure with just enough water to swallow them.  If you have diabetes, make sure that you are scheduled to have your procedure done first thing in the morning, whenever possible.  If you have diabetes, take only half of your insulin dose and notify our nurse that you have done so as soon as you arrive at the clinic.  If you are diabetic, but only take blood sugar pills (oral hypoglycemic), then do not take them on the morning of your procedure.  You may take them after you have had the  procedure.  Do not take aspirin or any aspirin-containing medications, at least eleven (11) days prior to the procedure.  They may prolong bleeding.  Wear loose fitting clothing that may be easy to take off and that you would not mind if it got stained with Betadine or blood.  Do not wear any jewelry or perfume  Remove any nail coloring.  It will interfere with some of our monitoring equipment.  NOTE: Remember that this is not meant to be interpreted as a complete list of all possible complications.  Unforeseen problems may occur.  BLOOD THINNERS The following drugs contain aspirin or other products, which can cause increased bleeding during surgery and should not be taken for 2 weeks prior to and 1 week after surgery.  If you should  need take something for relief of minor pain, you may take acetaminophen which is found in Tylenol,m Datril, Anacin-3 and Panadol. It is not blood thinner. The products listed below are.  Do not take any of the products listed below in addition to any listed on your instruction sheet.  A.P.C or A.P.C with Codeine Codeine Phosphate Capsules #3 Ibuprofen Ridaura  ABC compound Congesprin Imuran rimadil  Advil Cope Indocin Robaxisal  Alka-Seltzer Effervescent Pain Reliever and Antacid Coricidin or Coricidin-D  Indomethacin Rufen  Alka-Seltzer plus Cold Medicine Cosprin Ketoprofen S-A-C Tablets  Anacin Analgesic Tablets or Capsules Coumadin Korlgesic Salflex  Anacin Extra Strength Analgesic tablets or capsules CP-2 Tablets Lanoril Salicylate  Anaprox Cuprimine Capsules Levenox Salocol  Anexsia-D Dalteparin Magan Salsalate  Anodynos Darvon compound Magnesium Salicylate Sine-off  Ansaid Dasin Capsules Magsal Sodium Salicylate  Anturane Depen Capsules Marnal Soma  APF Arthritis pain formula Dewitt's Pills Measurin Stanback  Argesic Dia-Gesic Meclofenamic Sulfinpyrazone  Arthritis Bayer Timed Release Aspirin Diclofenac Meclomen Sulindac  Arthritis pain formula  Anacin Dicumarol Medipren Supac  Analgesic (Safety coated) Arthralgen Diffunasal Mefanamic Suprofen  Arthritis Strength Bufferin Dihydrocodeine Mepro Compound Suprol  Arthropan liquid Dopirydamole Methcarbomol with Aspirin Synalgos  ASA tablets/Enseals Disalcid Micrainin Tagament  Ascriptin Doan's Midol Talwin  Ascriptin A/D Dolene Mobidin Tanderil  Ascriptin Extra Strength Dolobid Moblgesic Ticlid  Ascriptin with Codeine Doloprin or Doloprin with Codeine Momentum Tolectin  Asperbuf Duoprin Mono-gesic Trendar  Aspergum Duradyne Motrin or Motrin IB Triminicin  Aspirin plain, buffered or enteric coated Durasal Myochrisine Trigesic  Aspirin Suppositories Easprin Nalfon Trillsate  Aspirin with Codeine Ecotrin Regular or Extra Strength Naprosyn Uracel  Atromid-S Efficin Naproxen Ursinus  Auranofin Capsules Elmiron Neocylate Vanquish  Axotal Emagrin Norgesic Verin  Azathioprine Empirin or Empirin with Codeine Normiflo Vitamin E  Azolid Emprazil Nuprin Voltaren  Bayer Aspirin plain, buffered or children's or timed BC Tablets or powders Encaprin Orgaran Warfarin Sodium  Buff-a-Comp Enoxaparin Orudis Zorpin  Buff-a-Comp with Codeine Equegesic Os-Cal-Gesic   Buffaprin Excedrin plain, buffered or Extra Strength Oxalid   Bufferin Arthritis Strength Feldene Oxphenbutazone   Bufferin plain or Extra Strength Feldene Capsules Oxycodone with Aspirin   Bufferin with Codeine Fenoprofen Fenoprofen Pabalate or Pabalate-SF   Buffets II Flogesic Panagesic   Buffinol plain or Extra Strength Florinal or Florinal with Codeine Panwarfarin   Buf-Tabs Flurbiprofen Penicillamine   Butalbital Compound Four-way cold tablets Penicillin   Butazolidin Fragmin Pepto-Bismol   Carbenicillin Geminisyn Percodan   Carna Arthritis Reliever Geopen Persantine   Carprofen Gold's salt Persistin   Chloramphenicol Goody's Phenylbutazone   Chloromycetin Haltrain Piroxlcam   Clmetidine heparin Plaquenil   Cllnoril Hyco-pap  Ponstel   Clofibrate Hydroxy chloroquine Propoxyphen         Before stopping any of these medications, be sure to consult the physician who ordered them.  Some, such as Coumadin (Warfarin) are ordered to prevent or treat serious conditions such as "deep thrombosis", "pumonary embolisms", and other heart problems.  The amount of time that you may need off of the medication may also vary with the medication and the reason for which you were taking it.  If you are taking any of these medications, please make sure you notify your pain physician before you undergo any procedures.          Baclofen and hydrocodone acetaminophen

## 2016-02-06 ENCOUNTER — Telehealth: Payer: Self-pay | Admitting: *Deleted

## 2016-02-06 NOTE — Telephone Encounter (Signed)
Spoke with patient re; procedure on yesterday,  Verbalizes no questions or concerns.

## 2016-02-13 ENCOUNTER — Ambulatory Visit: Payer: PPO | Attending: Pain Medicine | Admitting: Pain Medicine

## 2016-02-13 ENCOUNTER — Encounter: Payer: Self-pay | Admitting: Pain Medicine

## 2016-02-13 VITALS — BP 120/76 | HR 69 | Temp 98.9°F | Resp 14 | Ht 61.0 in | Wt 135.0 lb

## 2016-02-13 DIAGNOSIS — M50221 Other cervical disc displacement at C4-C5 level: Secondary | ICD-10-CM | POA: Insufficient documentation

## 2016-02-13 DIAGNOSIS — M5126 Other intervertebral disc displacement, lumbar region: Secondary | ICD-10-CM | POA: Diagnosis not present

## 2016-02-13 DIAGNOSIS — G8929 Other chronic pain: Secondary | ICD-10-CM

## 2016-02-13 DIAGNOSIS — M5030 Other cervical disc degeneration, unspecified cervical region: Secondary | ICD-10-CM

## 2016-02-13 DIAGNOSIS — M5136 Other intervertebral disc degeneration, lumbar region: Secondary | ICD-10-CM

## 2016-02-13 DIAGNOSIS — M545 Low back pain, unspecified: Secondary | ICD-10-CM

## 2016-02-13 DIAGNOSIS — M5416 Radiculopathy, lumbar region: Secondary | ICD-10-CM | POA: Diagnosis not present

## 2016-02-13 DIAGNOSIS — M533 Sacrococcygeal disorders, not elsewhere classified: Secondary | ICD-10-CM

## 2016-02-13 DIAGNOSIS — M5481 Occipital neuralgia: Secondary | ICD-10-CM | POA: Insufficient documentation

## 2016-02-13 DIAGNOSIS — M5010 Cervical disc disorder with radiculopathy, unspecified cervical region: Secondary | ICD-10-CM | POA: Diagnosis not present

## 2016-02-13 DIAGNOSIS — M79606 Pain in leg, unspecified: Secondary | ICD-10-CM | POA: Diagnosis not present

## 2016-02-13 DIAGNOSIS — G90523 Complex regional pain syndrome I of lower limb, bilateral: Secondary | ICD-10-CM

## 2016-02-13 DIAGNOSIS — M47896 Other spondylosis, lumbar region: Secondary | ICD-10-CM | POA: Insufficient documentation

## 2016-02-13 DIAGNOSIS — M791 Myalgia: Secondary | ICD-10-CM | POA: Diagnosis not present

## 2016-02-13 DIAGNOSIS — G894 Chronic pain syndrome: Secondary | ICD-10-CM

## 2016-02-13 DIAGNOSIS — M47817 Spondylosis without myelopathy or radiculopathy, lumbosacral region: Secondary | ICD-10-CM | POA: Diagnosis not present

## 2016-02-13 DIAGNOSIS — M47816 Spondylosis without myelopathy or radiculopathy, lumbar region: Secondary | ICD-10-CM

## 2016-02-13 DIAGNOSIS — M4802 Spinal stenosis, cervical region: Secondary | ICD-10-CM | POA: Diagnosis not present

## 2016-02-13 DIAGNOSIS — G90521 Complex regional pain syndrome I of right lower limb: Secondary | ICD-10-CM

## 2016-02-13 DIAGNOSIS — M5137 Other intervertebral disc degeneration, lumbosacral region: Secondary | ICD-10-CM

## 2016-02-13 MED ORDER — HYDROCODONE-ACETAMINOPHEN 5-325 MG PO TABS: ORAL_TABLET | ORAL | Status: DC

## 2016-02-13 MED ORDER — BACLOFEN 10 MG PO TABS
ORAL_TABLET | ORAL | Status: DC
Start: 2016-02-13 — End: 2016-03-12

## 2016-02-13 NOTE — Patient Instructions (Addendum)
PLAN   Continue present medication hydrocodone acetaminophen and begin baclofen. Do not take Zanaflex (TIZANIDINE)   F/U PCP Dr. Carlis Abbott for evaliation of  BP and general medical condition  F/U with neurosurgeon as discussed   Ask the nurses and secretary's the date of your lumbar MRI  F/U neurological evaluation. May consider pending follow-up evaluation  May consider radiofrequency rhizolysis or intraspinal procedures pending response to present treatment and F/U evaluation  Patient to call pain management for any change in condition or concerns prior to scheduled appointment Pain Management Discharge Instructions  General Discharge Instructions :  If you need to reach your doctor call: Monday-Friday 8:00 am - 4:00 pm at [phone] or toll free [phone].  After clinic hours [phone] to have operator reach doctor.  Bring all of your medication bottles to all your appointments in the pain clinic.  To cancel or reschedule your appointment with Pain Management please remember to call 24 hours in advance to avoid a fee.  Refer to the educational materials which you have been given on: General Risks, I had my Procedure. Discharge Instructions, Post Sedation.  Post Procedure Instructions:  The drugs you were given will stay in your system until tomorrow, so for the next 24 hours you should not drive, make any legal decisions or drink any alcoholic beverages.  You may eat anything you prefer, but it is better to start with liquids then soups and crackers, and gradually work up to solid foods.  Please notify your doctor immediately if you have any unusual bleeding, trouble breathing or pain that is not related to your normal pain.  Depending on the type of procedure that was done, some parts of your body may feel week and/or numb.  This usually clears up by tonight or the next day.  Walk with the use of an assistive device or accompanied by an adult for the 24 hours.  You  may use ice on the affected area for the first 24 hours.  Put ice in a Ziploc bag and cover with a towel and place against area 15 minutes on 15 minutes off.  You may switch to heat after 24 hours.

## 2016-02-13 NOTE — Progress Notes (Signed)
Subjective:    Patient ID: Karen Edwards, female    DOB: December 27, 1965, 50 y.o.   MRN: [id number]  HPI  The patient is a 50 year old female who returns to pain management for further evaluation and treatment of pain involving the lumbar lower extremity region predominantly the patient also is with history of pain involving the cervical region and we obtain cervical MRI of the cervical region for further assessment of patient's condition. We proceeded with immediate neurosurgical evaluation of patient and decided to avoid interventional treatment of the cervical region. At the present time patient is to undergo surgery of the cervical region scheduled for the month of April 2017. We will continue medications consisting of  baclofen and hydrocodone acetaminophen. The patient will follow-up with her surgeon with planned surgery of the cervical region this month. All agreed to suggested treatment plan     Review of Systems     Objective:   Physical Exam  There was tends to palpation of the paraspinal muscular treat and cervical and cervical facet region with limited range of motion of the cervical spine with rotation and flexion extension all producing significant pain. The patient was with decreased grip strength on the right compared to the left. Tinel and Phalen's maneuver were without increased pain of significant degree. There was moderate tenderness of the splenius capitis and occipitalis musculature region and mild tenderness of the acromioclavicular and glenohumeral joint region. Palpation of the acromioclavicular and glenohumeral joint regions reproduce mild discomfort with unremarkable Spurling's maneuver. Palpation over the thoracic facet thoracic paraspinal musculature region was with evidence of muscle spasms of the upper mid lower thoracic paraspinal muscles regions. No crepitus of the thoracic region was noted. Palpation over the lumbar paraspinal musculature region lumbar facet region  was attends to palpation of moderate degree with moderate tenderness over the PSIS PII S region gluteal and piriformis musculature region. Straight leg raise was tolerates approximately 30 without a definite increased pain with dorsiflexion noted. There was negative clonus negative Homans. No definite sensory deficit or dermatomal distribution of the lower extremity is noted. DTRs appeared to be trace at the knees. Abdomen nontender with no costovertebral tenderness noted    Assessment & Plan:      Degenerative changes lumbar spine L1-2 level degenerative disc disease with right central disc extrusion cranial migration of disc material   L1-L2: Small right paracentral disc protrusion. No spinal stenosis or nerve root encroachment.  L2-L3: Mild disc bulging. No spinal stenosis or nerve root encroachment.  L3-L4: Disc height and hydration are maintained. There is mild facet and ligamentous hypertrophy. No spinal stenosis or nerve root encroachment.  L4-L5: Mild disc bulging with mild facet and ligamentous hypertrophy. No spinal stenosis or nerve root encroachment.  L5-S1: Disc height and hydration are maintained. No spinal stenosis or nerve root encroachment.  IMPRESSION:  1. Small right paracentral disc protrusion at L1-L2. 2. Mild disc bulging and facet disease from L2-L3 through L4-L5. 3. No significant spinal stenosis or nerve root encroachment. 4. 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 C6-C7 broad-based disc herniation narrows the spinal canal but does not compress the cord shows foraminal extension.  C5-C6 and C6-C7 abnormalities noted with C4-C5  changes apparently have worsened since prior study  Cervical stenosis  Cervical radiculopathy  Cervical facet syndrome       PLAN   Continue present medication hydrocodone acetaminophen and begin baclofen. Do not take Zanaflex (TIZANIDINE)   F/U PCP Dr. Carlis Abbott for evaliation of  BP and general medical condition  F/U with neurosurgeon as discussed   Ask the nurses and secretary's the date of your lumbar MRI  F/U neurological evaluation. May consider pending follow-up evaluation  May consider radiofrequency rhizolysis or intraspinal procedures pending response to present treatment and F/U evaluation  Patient to call pain management for any change in condition or concerns prior to scheduled appointment

## 2016-02-13 NOTE — Progress Notes (Signed)
Safety precautions to be maintained throughout the outpatient stay will include: orient to surroundings, keep bed in low position, maintain call bell within reach at all times, provide assistance with transfer out of bed and ambulation.  

## 2016-02-16 ENCOUNTER — Encounter: Payer: Self-pay | Admitting: Primary Care

## 2016-02-20 ENCOUNTER — Ambulatory Visit
Admission: RE | Admit: 2016-02-20 | Discharge: 2016-02-20 | Disposition: A | Discharge status: Home / Self Care | Payer: PPO | Source: Ambulatory Visit | Attending: Pain Medicine | Admitting: Pain Medicine

## 2016-02-20 DIAGNOSIS — M533 Sacrococcygeal disorders, not elsewhere classified: Secondary | ICD-10-CM | POA: Diagnosis not present

## 2016-02-20 DIAGNOSIS — G894 Chronic pain syndrome: Secondary | ICD-10-CM

## 2016-02-20 DIAGNOSIS — M545 Low back pain, unspecified: Secondary | ICD-10-CM

## 2016-02-20 DIAGNOSIS — G90521 Complex regional pain syndrome I of right lower limb: Secondary | ICD-10-CM

## 2016-02-20 DIAGNOSIS — M5030 Other cervical disc degeneration, unspecified cervical region: Secondary | ICD-10-CM | POA: Diagnosis not present

## 2016-02-20 DIAGNOSIS — M5137 Other intervertebral disc degeneration, lumbosacral region: Secondary | ICD-10-CM | POA: Insufficient documentation

## 2016-02-20 DIAGNOSIS — M5010 Cervical disc disorder with radiculopathy, unspecified cervical region: Secondary | ICD-10-CM

## 2016-02-20 DIAGNOSIS — M5481 Occipital neuralgia: Secondary | ICD-10-CM

## 2016-02-20 DIAGNOSIS — M5126 Other intervertebral disc displacement, lumbar region: Secondary | ICD-10-CM | POA: Insufficient documentation

## 2016-02-20 DIAGNOSIS — M5416 Radiculopathy, lumbar region: Secondary | ICD-10-CM

## 2016-02-20 DIAGNOSIS — M5136 Other intervertebral disc degeneration, lumbar region: Secondary | ICD-10-CM | POA: Insufficient documentation

## 2016-02-20 DIAGNOSIS — M79606 Pain in leg, unspecified: Secondary | ICD-10-CM | POA: Diagnosis not present

## 2016-02-20 DIAGNOSIS — G8929 Other chronic pain: Secondary | ICD-10-CM | POA: Diagnosis not present

## 2016-02-20 DIAGNOSIS — M47816 Spondylosis without myelopathy or radiculopathy, lumbar region: Secondary | ICD-10-CM

## 2016-02-20 DIAGNOSIS — M4802 Spinal stenosis, cervical region: Secondary | ICD-10-CM | POA: Insufficient documentation

## 2016-02-20 DIAGNOSIS — G90523 Complex regional pain syndrome I of lower limb, bilateral: Secondary | ICD-10-CM

## 2016-02-21 DIAGNOSIS — K219 Gastro-esophageal reflux disease without esophagitis: Secondary | ICD-10-CM | POA: Diagnosis not present

## 2016-02-21 DIAGNOSIS — R531 Weakness: Secondary | ICD-10-CM | POA: Diagnosis not present

## 2016-02-21 DIAGNOSIS — M5020 Other cervical disc displacement, unspecified cervical region: Secondary | ICD-10-CM | POA: Diagnosis not present

## 2016-02-21 DIAGNOSIS — R200 Anesthesia of skin: Secondary | ICD-10-CM | POA: Diagnosis not present

## 2016-02-21 DIAGNOSIS — M4802 Spinal stenosis, cervical region: Secondary | ICD-10-CM | POA: Diagnosis not present

## 2016-02-21 DIAGNOSIS — Z981 Arthrodesis status: Secondary | ICD-10-CM | POA: Diagnosis not present

## 2016-02-21 DIAGNOSIS — M50222 Other cervical disc displacement at C5-C6 level: Secondary | ICD-10-CM | POA: Diagnosis not present

## 2016-02-21 DIAGNOSIS — Z79899 Other long term (current) drug therapy: Secondary | ICD-10-CM | POA: Diagnosis not present

## 2016-02-21 DIAGNOSIS — F17210 Nicotine dependence, cigarettes, uncomplicated: Secondary | ICD-10-CM | POA: Diagnosis not present

## 2016-02-21 DIAGNOSIS — M4312 Spondylolisthesis, cervical region: Secondary | ICD-10-CM | POA: Diagnosis not present

## 2016-02-21 DIAGNOSIS — Z4889 Encounter for other specified surgical aftercare: Secondary | ICD-10-CM | POA: Diagnosis not present

## 2016-02-21 DIAGNOSIS — R202 Paresthesia of skin: Secondary | ICD-10-CM | POA: Diagnosis not present

## 2016-02-21 HISTORY — PX: CERVICAL SPINE SURGERY: SHX589

## 2016-02-23 ENCOUNTER — Telehealth: Payer: Self-pay

## 2016-02-23 NOTE — Telephone Encounter (Signed)
Pharmacy called to let us know that Karen Edwards came in with a script for oxycodone from [facility] on 02-22-16

## 2016-02-27 ENCOUNTER — Other Ambulatory Visit: Payer: Self-pay | Admitting: Primary Care

## 2016-02-27 DIAGNOSIS — G4700 Insomnia, unspecified: Secondary | ICD-10-CM

## 2016-02-27 NOTE — Telephone Encounter (Signed)
Electronically refill request for   traZODone (DESYREL) 100 MG tablet   Take 2 tablets (200 mg total) by mouth at bedtime.  Dispense: 60 tablet   Refills: 2     Last prescribed on 04/10/2015. Last seen on 01/09/2016. CPE on 07/11/2016.

## 2016-03-03 NOTE — Telephone Encounter (Signed)
Please call and assess the patient's need. Be as specific as possible. Come back and talk to me once all the details are known. If what they need is a block, find out the location of the pain, laterality, pain referral, and their sedation/no sedation preference.

## 2016-03-05 ENCOUNTER — Encounter: Payer: Self-pay | Admitting: Pain Medicine

## 2016-03-05 ENCOUNTER — Encounter: Payer: Self-pay | Admitting: Primary Care

## 2016-03-07 ENCOUNTER — Other Ambulatory Visit: Payer: Self-pay | Admitting: Primary Care

## 2016-03-12 ENCOUNTER — Other Ambulatory Visit: Payer: Self-pay | Admitting: Primary Care

## 2016-03-12 ENCOUNTER — Ambulatory Visit: Payer: PPO | Attending: Pain Medicine | Admitting: Pain Medicine

## 2016-03-12 ENCOUNTER — Encounter: Payer: Self-pay | Admitting: Pain Medicine

## 2016-03-12 VITALS — BP 109/77 | HR 76 | Temp 99.1°F | Resp 16 | Ht 61.0 in | Wt 135.0 lb

## 2016-03-12 DIAGNOSIS — M47816 Spondylosis without myelopathy or radiculopathy, lumbar region: Secondary | ICD-10-CM | POA: Diagnosis not present

## 2016-03-12 DIAGNOSIS — M791 Myalgia: Secondary | ICD-10-CM | POA: Diagnosis not present

## 2016-03-12 DIAGNOSIS — G90521 Complex regional pain syndrome I of right lower limb: Secondary | ICD-10-CM | POA: Diagnosis not present

## 2016-03-12 DIAGNOSIS — M5416 Radiculopathy, lumbar region: Secondary | ICD-10-CM

## 2016-03-12 DIAGNOSIS — M5010 Cervical disc disorder with radiculopathy, unspecified cervical region: Secondary | ICD-10-CM

## 2016-03-12 DIAGNOSIS — M533 Sacrococcygeal disorders, not elsewhere classified: Secondary | ICD-10-CM | POA: Diagnosis not present

## 2016-03-12 DIAGNOSIS — M51379 Other intervertebral disc degeneration, lumbosacral region without mention of lumbar back pain or lower extremity pain: Secondary | ICD-10-CM

## 2016-03-12 DIAGNOSIS — M545 Low back pain: Secondary | ICD-10-CM

## 2016-03-12 DIAGNOSIS — M5137 Other intervertebral disc degeneration, lumbosacral region: Secondary | ICD-10-CM

## 2016-03-12 DIAGNOSIS — M5030 Other cervical disc degeneration, unspecified cervical region: Secondary | ICD-10-CM

## 2016-03-12 DIAGNOSIS — M4802 Spinal stenosis, cervical region: Secondary | ICD-10-CM | POA: Diagnosis not present

## 2016-03-12 DIAGNOSIS — G90523 Complex regional pain syndrome I of lower limb, bilateral: Secondary | ICD-10-CM | POA: Diagnosis not present

## 2016-03-12 DIAGNOSIS — G8929 Other chronic pain: Secondary | ICD-10-CM

## 2016-03-12 DIAGNOSIS — M5136 Other intervertebral disc degeneration, lumbar region: Secondary | ICD-10-CM | POA: Diagnosis not present

## 2016-03-12 DIAGNOSIS — G894 Chronic pain syndrome: Secondary | ICD-10-CM | POA: Diagnosis not present

## 2016-03-12 DIAGNOSIS — M5481 Occipital neuralgia: Secondary | ICD-10-CM | POA: Diagnosis not present

## 2016-03-12 DIAGNOSIS — M47817 Spondylosis without myelopathy or radiculopathy, lumbosacral region: Secondary | ICD-10-CM | POA: Diagnosis not present

## 2016-03-12 DIAGNOSIS — M51369 Other intervertebral disc degeneration, lumbar region without mention of lumbar back pain or lower extremity pain: Secondary | ICD-10-CM

## 2016-03-12 MED ORDER — BACLOFEN 10 MG PO TABS: ORAL_TABLET | ORAL | Status: DC

## 2016-03-12 NOTE — Patient Instructions (Addendum)
PLAN   Continue present medications  hydrocodone acetaminophen and baclofen. Do not take Zanaflex (TIZANIDINE)   Lumbar facet, medial branch nerve, blocks to be performed at time return appointment pending approval by Dr. Pamala Hurry  F/U PCP Dr. Carlis Abbott for evaliation of  BP and general medical condition  F/U with neurosurgeon as discussed Patient will follow-up with Dr. Pamala Hurry status post surgery of cervical region as planned as well as to review recent lumbar MRI  F/U neurological evaluation. May consider pending follow-up evaluation  May consider radiofrequency rhizolysis or intraspinal procedures pending response to present treatment and F/U evaluation  Patient to call pain management for any change in condition or concerns prior to scheduled appointmentFacet Joint Block The facet joints connect the bones of the spine (vertebrae). They make it possible for you to bend, twist, and make other movements with your spine. They also prevent you from overbending, overtwisting, and making other excessive movements.  A facet joint block is a procedure where a numbing medicine (anesthetic) is injected into a facet joint. Often, a type of anti-inflammatory medicine called a steroid is also injected. A facet joint block may be done for two reasons:   Diagnosis. A facet joint block may be done as a test to see whether neck or back pain is caused by a worn-down or infected facet joint. If the pain gets better after a facet joint block, it means the pain is probably coming from the facet joint. If the pain does not get better, it means the pain is probably not coming from the facet joint.   Therapy. A facet joint block may be done to relieve neck or back pain caused by a facet joint. A facet joint block is only done as a therapy if the pain does not improve with medicine, exercise programs, physical therapy, and other forms of pain management. LET [facility] CARE PROVIDER KNOW ABOUT:   Any allergies you  have.   All medicines you are taking, including vitamins, herbs, eyedrops, and over-the-counter medicines and creams.   Previous problems you or members of your family have had with the use of anesthetics.   Any blood disorders you have had.   Other health problems you have. RISKS AND COMPLICATIONS Generally, having a facet joint block is safe. However, as with any procedure, complications can occur. Possible complications associated with having a facet joint block include:   Bleeding.   Injury to a nerve near the injection site.   Pain at the injection site.   Weakness or numbness in areas controlled by nerves near the injection site.   Infection.   Temporary fluid retention.   Allergic reaction to anesthetics or medicines used during the procedure. BEFORE THE PROCEDURE   Follow your health care provider's instructions if you are taking dietary supplements or medicines. You may need to stop taking them or reduce your dosage.   Do not take any new dietary supplements or medicines without asking your health care provider first.   Follow your health care provider's instructions about eating and drinking before the procedure. You may need to stop eating and drinking several hours before the procedure.   Arrange to have an adult drive you home after the procedure. PROCEDURE  You may need to remove your clothing and dress in an open-back gown so that your health care provider can access your spine.   The procedure will be done while you are lying on an X-ray table. Most of the time you will be  asked to lie on your stomach, but you may be asked to lie in a different position if an injection will be made in your neck.   Special machines will be used to monitor your oxygen levels, heart rate, and blood pressure.   If an injection will be made in your neck, an intravenous (IV) tube will be inserted into one of your veins. Fluids and medicine will flow directly into  your body through the IV tube.   The area over the facet joint where the injection will be made will be cleaned with an antiseptic soap. The surrounding skin will be covered with sterile drapes.   An anesthetic will be applied to your skin to make the injection area numb. You may feel a temporary stinging or burning sensation.   A video X-ray machine will be used to locate the joint. A contrast dye may be injected into the facet joint area to help with locating the joint.   When the joint is located, an anesthetic medicine will be injected into the joint through the needle.   Your health care provider will ask you whether you feel pain relief. If you do feel relief, a steroid may be injected to provide pain relief for a longer period of time. If you do not feel relief or feel only partial relief, additional injections of an anesthetic may be made in other facet joints.   The needle will be removed, the skin will be cleansed, and bandages will be applied.  AFTER THE PROCEDURE   You will be observed for 15-30 minutes before being allowed to go home. Do not drive. Have an adult drive you or take a taxi or public transportation instead.   If you feel pain relief, the pain will return in several hours or days when the anesthetic wears off.   You may feel pain relief 2-14 days after the procedure. The amount of time this relief lasts varies from person to person.   It is normal to feel some tenderness over the injected area(s) for 2 days following the procedure.   If you have diabetes, you may have a temporary increase in blood sugar.   This information is not intended to replace advice given to you by your health care provider. Make sure you discuss any questions you have with your health care provider.   Document Released: 03/05/2007 Document Revised: 11/04/2014 Document Reviewed: 08/03/2012 Elsevier Interactive Patient Education 2016 [employer]  What are the risk, side effects and possible complications? Generally speaking, most procedures are safe.  However, with any procedure there are risks, side effects, and the possibility of complications.  The risks and complications are dependent upon the sites that are lesioned, or the type of nerve block to be performed.  The closer the procedure is to the spine, the more serious the risks are.  Great care is taken when placing the radio frequency needles, block needles or lesioning probes, but sometimes complications can occur.  Infection: Any time there is an injection through the skin, there is a risk of infection.  This is why sterile conditions are used for these blocks.  There are four possible types of infection.  Localized skin infection.  Central Nervous System Infection-This can be in the form of Meningitis, which can be deadly.  Epidural Infections-This can be in the form of an epidural abscess, which can cause pressure inside of the spine, causing compression of the spinal cord with subsequent paralysis.  This would require an emergency surgery to decompress, and there are no guarantees that the patient would recover from the paralysis.  Discitis-This is an infection of the intervertebral discs.  It occurs in about 1% of discography procedures.  It is difficult to treat and it may lead to surgery.        2. Pain: the needles have to go through skin and soft tissues, will cause soreness.       3. Damage to internal structures:  The nerves to be lesioned may be near blood vessels or    other nerves which can be potentially damaged.       4. Bleeding: Bleeding is more common if the patient is taking blood thinners such as  aspirin, Coumadin, Ticiid, Plavix, etc., or if he/she have some genetic predisposition  such as hemophilia. Bleeding into the spinal canal can cause compression of the spinal  cord with subsequent paralysis.  This would require an emergency surgery to   decompress and there are no guarantees that the patient would recover from the  paralysis.       5. Pneumothorax:  Puncturing of a lung is a possibility, every time a needle is introduced in  the area of the chest or upper back.  Pneumothorax refers to free air around the  collapsed lung(s), inside of the thoracic cavity (chest cavity).  Another two possible  complications related to a similar event would include: Hemothorax and Chylothorax.   These are variations of the Pneumothorax, where instead of air around the collapsed  lung(s), you may have blood or chyle, respectively.       6. Spinal headaches: They may occur with any procedures in the area of the spine.       7. Persistent CSF (Cerebro-Spinal Fluid) leakage: This is a rare problem, but may occur  with prolonged intrathecal or epidural catheters either due to the formation of a fistulous  track or a dural tear.       8. Nerve damage: By working so close to the spinal cord, there is always a possibility of  nerve damage, which could be as serious as a permanent spinal cord injury with  paralysis.       9. Death:  Although rare, severe deadly allergic reactions known as "Anaphylactic  reaction" can occur to any of the medications used.      10. Worsening of the symptoms:  We can always make thing worse.  What are the chances of something like this happening? Chances of any of this occuring are extremely low.  By statistics, you have more of a chance of getting killed in a motor vehicle accident: while driving to the hospital than any of the above occurring .  Nevertheless, you should be aware that they are possibilities.  In general, it is similar to taking a shower.  Everybody knows that you can slip, hit your head and get killed.  Does that mean that you should not shower again?  Nevertheless always keep in mind that statistics do not mean anything if you happen to be on the wrong side of them.  Even if a procedure has a 1 (one) in a 1,000,000  (million) chance of going wrong, it you happen to be that one..Also, keep in mind that by statistics, you have more of a chance of having something go wrong when taking medications.  Who should not have this procedure? If you are on a blood thinning medication (e.g. Coumadin, Plavix, see  list of "Blood Thinners"), or if you have an active infection going on, you should not have the procedure.  If you are taking any blood thinners, please inform your physician.  How should I prepare for this procedure?  Do not eat or drink anything at least six hours prior to the procedure.  Bring a driver with you .  It cannot be a taxi.  Come accompanied by an adult that can drive you back, and that is strong enough to help you if your legs get weak or numb from the local anesthetic.  Take all of your medicines the morning of the procedure with just enough water to swallow them.  If you have diabetes, make sure that you are scheduled to have your procedure done first thing in the morning, whenever possible.  If you have diabetes, take only half of your insulin dose and notify our nurse that you have done so as soon as you arrive at the clinic.  If you are diabetic, but only take blood sugar pills (oral hypoglycemic), then do not take them on the morning of your procedure.  You may take them after you have had the procedure.  Do not take aspirin or any aspirin-containing medications, at least eleven (11) days prior to the procedure.  They may prolong bleeding.  Wear loose fitting clothing that may be easy to take off and that you would not mind if it got stained with Betadine or blood.  Do not wear any jewelry or perfume  Remove any nail coloring.  It will interfere with some of our monitoring equipment.  NOTE: Remember that this is not meant to be interpreted as a complete list of all possible complications.  Unforeseen problems may occur.  BLOOD THINNERS The following drugs contain aspirin or other  products, which can cause increased bleeding during surgery and should not be taken for 2 weeks prior to and 1 week after surgery.  If you should need take something for relief of minor pain, you may take acetaminophen which is found in Tylenol,m Datril, Anacin-3 and Panadol. It is not blood thinner. The products listed below are.  Do not take any of the products listed below in addition to any listed on your instruction sheet.  A.P.C or A.P.C with Codeine Codeine Phosphate Capsules #3 Ibuprofen Ridaura  ABC compound Congesprin Imuran rimadil  Advil Cope Indocin Robaxisal  Alka-Seltzer Effervescent Pain Reliever and Antacid Coricidin or Coricidin-D  Indomethacin Rufen  Alka-Seltzer plus Cold Medicine Cosprin Ketoprofen S-A-C Tablets  Anacin Analgesic Tablets or Capsules Coumadin Korlgesic Salflex  Anacin Extra Strength Analgesic tablets or capsules CP-2 Tablets Lanoril Salicylate  Anaprox Cuprimine Capsules Levenox Salocol  Anexsia-D Dalteparin Magan Salsalate  Anodynos Darvon compound Magnesium Salicylate Sine-off  Ansaid Dasin Capsules Magsal Sodium Salicylate  Anturane Depen Capsules Marnal Soma  APF Arthritis pain formula Dewitt's Pills Measurin Stanback  Argesic Dia-Gesic Meclofenamic Sulfinpyrazone  Arthritis Bayer Timed Release Aspirin Diclofenac Meclomen Sulindac  Arthritis pain formula Anacin Dicumarol Medipren Supac  Analgesic (Safety coated) Arthralgen Diffunasal Mefanamic Suprofen  Arthritis Strength Bufferin Dihydrocodeine Mepro Compound Suprol  Arthropan liquid Dopirydamole Methcarbomol with Aspirin Synalgos  ASA tablets/Enseals Disalcid Micrainin Tagament  Ascriptin Doan's Midol Talwin  Ascriptin A/D Dolene Mobidin Tanderil  Ascriptin Extra Strength Dolobid Moblgesic Ticlid  Ascriptin with Codeine Doloprin or Doloprin with Codeine Momentum Tolectin  Asperbuf Duoprin Mono-gesic Trendar  Aspergum Duradyne Motrin or Motrin IB Triminicin  Aspirin plain, buffered or enteric  coated Durasal Myochrisine Trigesic  Aspirin Suppositories  Easprin Nalfon Trillsate  Aspirin with Codeine Ecotrin Regular or Extra Strength Naprosyn Uracel  Atromid-S Efficin Naproxen Ursinus  Auranofin Capsules Elmiron Neocylate Vanquish  Axotal Emagrin Norgesic Verin  Azathioprine Empirin or Empirin with Codeine Normiflo Vitamin E  Azolid Emprazil Nuprin Voltaren  Bayer Aspirin plain, buffered or children's or timed BC Tablets or powders Encaprin Orgaran Warfarin Sodium  Buff-a-Comp Enoxaparin Orudis Zorpin  Buff-a-Comp with Codeine Equegesic Os-Cal-Gesic   Buffaprin Excedrin plain, buffered or Extra Strength Oxalid   Bufferin Arthritis Strength Feldene Oxphenbutazone   Bufferin plain or Extra Strength Feldene Capsules Oxycodone with Aspirin   Bufferin with Codeine Fenoprofen Fenoprofen Pabalate or Pabalate-SF   Buffets II Flogesic Panagesic   Buffinol plain or Extra Strength Florinal or Florinal with Codeine Panwarfarin   Buf-Tabs Flurbiprofen Penicillamine   Butalbital Compound Four-way cold tablets Penicillin   Butazolidin Fragmin Pepto-Bismol   Carbenicillin Geminisyn Percodan   Carna Arthritis Reliever Geopen Persantine   Carprofen Gold's salt Persistin   Chloramphenicol Goody's Phenylbutazone   Chloromycetin Haltrain Piroxlcam   Clmetidine heparin Plaquenil   Cllnoril Hyco-pap Ponstel   Clofibrate Hydroxy chloroquine Propoxyphen         Before stopping any of these medications, be sure to consult the physician who ordered them.  Some, such as Coumadin (Warfarin) are ordered to prevent or treat serious conditions such as "deep thrombosis", "pumonary embolisms", and other heart problems.  The amount of time that you may need off of the medication may also vary with the medication and the reason for which you were taking it.  If you are taking any of these medications, please make sure you notify your pain physician before you undergo any procedures.

## 2016-03-12 NOTE — Progress Notes (Signed)
Subjective:    Patient ID: Karen Edwards, female    DOB: [birth year], 50 y.o.   MRN: [id number]  HPI  The patient is a 50 year old female who returns to pain management for further evaluation and treatment of pain involving the neck upper extremity region as well as the lower back and lower extremity region. The patient is status post surgical intervention of the cervical region performed by Dr. Pamala Hurry of [facility] neurosurgery Department. The patient states that she has had exacerbation of her lower back and lower extremity pain and she is in hopes of being able to undergo interventional treatment for her lower back and lower extremity pain. We informed patient that we will need approval from Dr. Pamala Hurry to proceed with interventional treatment of the lumbar region since patient had recent surgery of the cervical region. We informed patient that we would need permission to perform the procedure with local anesthetic and steroid and that we would proceed with such treatment should Dr. Pamala Hurry approve albumin such. The patient denied any trauma change in events of daily living the call significant change in symptomatology. The patient will continue medications as prescribed by Dr. Pamala Hurry this time and we we will continue patient's baclofen as discussed with patient on today's visit. The patient denies any drowsiness confusion and other undesirable side effects due to medications and prescribed her at this time. All agreed to suggested treatment plan   Review of Systems     Objective:   Physical Exam  There was tenderness to palpation of the paraspinal musculature region of the cervical region cervical facet region with well-healed surgical scar of the cervical region without increased warmth and erythema in the region of the scar. There was limited range of motion of the cervical spine noted. Palpation of the splenius capitis and occipitalis regions reproduce moderate discomfort with moderate  tenderness of the cervical facet cervical paraspinal muscular region. Palpation over the thoracic region thoracic facet region was attends to palpation of mild to moderate degree with evidence of muscle spasms involving thoracic paraspinal musculature region with no crepitus of the thoracic region noted. Palpation of the acromioclavicular and glenohumeral joint regions reproduced pain of mild degree. Patient appeared to be with slightly decreased grip strength with Tinel and Phalen's maneuver reproducing minimal discomfort. Palpation over the lumbar paraspinal musculatures and lumbar facet region was associated with moderate to severe discomfort with lateral bending rotation extension and palpation of the lumbar facets reproducing moderately severe discomfort. There was moderate tenderness over the PSIS and PII S regions as well with mild tenderness over the greater trochanteric region iliotibial band region. No definite sensory deficit of dermatomal distribution was detected. There appeared to be negative clonus negative Homans. DTRs were difficult to elicit. 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 No spinal stenosis or nerve root encroachment.  L2-L3: Mild disc bulging. No spinal stenosis or nerve root encroachment.  L3-L4: Disc height and hydration are maintained. There is mild facet and ligamentous hypertrophy. No spinal stenosis or nerve root encroachment.  L4-L5: Mild disc bulging with mild facet and ligamentous hypertrophy. No spinal stenosis or nerve root encroachment.  L5-S1: Disc height and hydration are maintained. No spinal stenosis or nerve root  encroachment.  IMPRESSION:  1. Small right  paracentral disc protrusion at L1-L2. 2. Mild disc bulging and facet disease from L2-L3 through L4-L5. 3. No significant spinal stenosis or nerve root encroachment. 4. 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 C6-C7 broad-based disc herniation narrows the spinal canal but does not compress the cord shows foraminal extension. C5-C6 and C6-C7 abnormalities noted with C4-C5 changes apparently have worsened since prior study  Status post cervical surgery  Cervical stenosis  Cervical radiculopathy  Cervical facet syndrome       PLAN   Continue present medications  hydrocodone acetaminophen and baclofen. Do not take Zanaflex (TIZANIDINE)   Lumbar facet, medial branch nerve, blocks to be performed at time return appointment pending approval by Dr. Pamala Hurry  F/U PCP Dr. Carlis Abbott for evaliation of  BP and general medical condition  F/U with neurosurgeon as discussed Patient will follow-up with Dr. Pamala Hurry status post surgery of cervical region as planned as well as to review recent lumbar MRI  F/U neurological evaluation. May consider pending follow-up evaluation  May consider radiofrequency rhizolysis or intraspinal procedures pending response to present treatment and F/U evaluation  Patient to call pain management for any change in condition or concerns prior to scheduled appointment

## 2016-03-14 ENCOUNTER — Encounter: Payer: PPO | Admitting: Pain Medicine

## 2016-03-18 ENCOUNTER — Ambulatory Visit: Payer: PPO | Admitting: Pain Medicine

## 2016-03-20 ENCOUNTER — Encounter: Payer: Self-pay | Admitting: Pain Medicine

## 2016-03-20 ENCOUNTER — Other Ambulatory Visit: Payer: Self-pay | Admitting: Pain Medicine

## 2016-03-20 ENCOUNTER — Ambulatory Visit: Payer: PPO | Attending: Pain Medicine | Admitting: Pain Medicine

## 2016-03-20 DIAGNOSIS — M5126 Other intervertebral disc displacement, lumbar region: Secondary | ICD-10-CM | POA: Diagnosis not present

## 2016-03-20 DIAGNOSIS — G894 Chronic pain syndrome: Secondary | ICD-10-CM

## 2016-03-20 DIAGNOSIS — G8929 Other chronic pain: Secondary | ICD-10-CM

## 2016-03-20 DIAGNOSIS — G90521 Complex regional pain syndrome I of right lower limb: Secondary | ICD-10-CM

## 2016-03-20 DIAGNOSIS — M4802 Spinal stenosis, cervical region: Secondary | ICD-10-CM

## 2016-03-20 DIAGNOSIS — M545 Low back pain, unspecified: Secondary | ICD-10-CM

## 2016-03-20 DIAGNOSIS — M5030 Other cervical disc degeneration, unspecified cervical region: Secondary | ICD-10-CM

## 2016-03-20 DIAGNOSIS — M5416 Radiculopathy, lumbar region: Secondary | ICD-10-CM

## 2016-03-20 DIAGNOSIS — M533 Sacrococcygeal disorders, not elsewhere classified: Secondary | ICD-10-CM

## 2016-03-20 DIAGNOSIS — M5137 Other intervertebral disc degeneration, lumbosacral region: Secondary | ICD-10-CM

## 2016-03-20 DIAGNOSIS — M5136 Other intervertebral disc degeneration, lumbar region: Secondary | ICD-10-CM

## 2016-03-20 DIAGNOSIS — M47817 Spondylosis without myelopathy or radiculopathy, lumbosacral region: Secondary | ICD-10-CM | POA: Diagnosis not present

## 2016-03-20 DIAGNOSIS — M5010 Cervical disc disorder with radiculopathy, unspecified cervical region: Secondary | ICD-10-CM

## 2016-03-20 DIAGNOSIS — G90523 Complex regional pain syndrome I of lower limb, bilateral: Secondary | ICD-10-CM

## 2016-03-20 DIAGNOSIS — M47816 Spondylosis without myelopathy or radiculopathy, lumbar region: Secondary | ICD-10-CM

## 2016-03-20 DIAGNOSIS — M79606 Pain in leg, unspecified: Secondary | ICD-10-CM | POA: Diagnosis not present

## 2016-03-20 DIAGNOSIS — M5481 Occipital neuralgia: Secondary | ICD-10-CM

## 2016-03-20 LAB — TOXASSURE SELECT 13 (MW), URINE

## 2016-03-20 MED ORDER — LACTATED RINGERS IV SOLN
1000.0000 mL | INTRAVENOUS | Status: DC
  Administered 2016-03-20: 1000 mL via INTRAVENOUS

## 2016-03-20 MED ORDER — TRIAMCINOLONE ACETONIDE 40 MG/ML IJ SUSP
40.0000 mg | Freq: Once | INTRAMUSCULAR | Status: AC
  Administered 2016-03-20: 40 mg
  Filled 2016-03-20: qty 1

## 2016-03-20 MED ORDER — FENTANYL CITRATE (PF) 100 MCG/2ML IJ SOLN
100.0000 ug | Freq: Once | INTRAMUSCULAR | Status: AC
  Administered 2016-03-20: 100 ug via INTRAVENOUS
  Filled 2016-03-20: qty 2

## 2016-03-20 MED ORDER — MIDAZOLAM HCL 5 MG/5ML IJ SOLN
5.0000 mg | Freq: Once | INTRAMUSCULAR | Status: AC
  Administered 2016-03-20: 5 mg via INTRAVENOUS
  Filled 2016-03-20: qty 5

## 2016-03-20 MED ORDER — CEFUROXIME AXETIL 250 MG PO TABS: 250.0000 mg | ORAL_TABLET | Freq: Two times a day (BID) | ORAL | Status: DC

## 2016-03-20 MED ORDER — ORPHENADRINE CITRATE 30 MG/ML IJ SOLN
60.0000 mg | Freq: Once | INTRAMUSCULAR | Status: AC
  Administered 2016-03-20: 60 mg via INTRAMUSCULAR
  Filled 2016-03-20: qty 2

## 2016-03-20 MED ORDER — CEFAZOLIN SODIUM 1-5 GM-% IV SOLN
1.0000 g | Freq: Once | INTRAVENOUS | Status: AC
  Administered 2016-03-20: 1 g via INTRAVENOUS

## 2016-03-20 MED ORDER — BUPIVACAINE HCL (PF) 0.25 % IJ SOLN
30.0000 mL | Freq: Once | INTRAMUSCULAR | Status: AC
  Administered 2016-03-20: 30 mL
  Filled 2016-03-20: qty 30

## 2016-03-20 NOTE — Progress Notes (Signed)
Quick Note:  Reviewed. ______ 

## 2016-03-20 NOTE — Patient Instructions (Addendum)
PLAN   Continue present medications  hydrocodone acetaminophen and baclofen. Do not take Zanaflex (TIZANIDINE) Please obtain Ceftin antibiotic today and begin taking Ceftin antibiotic today as prescribed  F/U PCP Dr. Carlis Abbott for evaliation of  BP and general medical condition  F/U with neurosurgeon as discussed Patient will follow-up with Dr. Pamala Hurry status post surgery of cervical region as planned as well as to review recent lumbar MRI  F/U neurological evaluation. May consider pending follow-up evaluation  May consider radiofrequency rhizolysis or intraspinal procedures pending response to present treatment and F/U evaluation  Patient to call pain management for any change in condition or concerns prior to scheduled appointmentPain Management Discharge Instructions  General Discharge Instructions :  If you need to reach your doctor call: Monday-Friday 8:00 am - 4:00 pm at [phone] or toll free [phone].  After clinic hours [phone] to have operator reach doctor.  Bring all of your medication bottles to all your appointments in the pain clinic.  To cancel or reschedule your appointment with Pain Management please remember to call 24 hours in advance to avoid a fee.  Refer to the educational materials which you have been given on: General Risks, I had my Procedure. Discharge Instructions, Post Sedation.  Post Procedure Instructions:  The drugs you were given will stay in your system until tomorrow, so for the next 24 hours you should not drive, make any legal decisions or drink any alcoholic beverages.  You may eat anything you prefer, but it is better to start with liquids then soups and crackers, and gradually work up to solid foods.  Please notify your doctor immediately if you have any unusual bleeding, trouble breathing or pain that is not related to your normal pain.  Depending on the type of procedure that was done, some parts of your body may feel week and/or  numb.  This usually clears up by tonight or the next day.  Walk with the use of an assistive device or accompanied by an adult for the 24 hours.  You may use ice on the affected area for the first 24 hours.  Put ice in a Ziploc bag and cover with a towel and place against area 15 minutes on 15 minutes off.  You may switch to heat after 24 hours.GENERAL RISKS AND COMPLICATIONS  What are the risk, side effects and possible complications? Generally speaking, most procedures are safe.  However, with any procedure there are risks, side effects, and the possibility of complications.  The risks and complications are dependent upon the sites that are lesioned, or the type of nerve block to be performed.  The closer the procedure is to the spine, the more serious the risks are.  Great care is taken when placing the radio frequency needles, block needles or lesioning probes, but sometimes complications can occur. 1. Infection: Any time there is an injection through the skin, there is a risk of infection.  This is why sterile conditions are used for these blocks.  There are four possible types of infection. 1. Localized skin infection. 2. Central Nervous System Infection-This can be in the form of Meningitis, which can be deadly. 3. Epidural Infections-This can be in the form of an epidural abscess, which can cause pressure inside of the spine, causing compression of the spinal cord with subsequent paralysis. This would require an emergency surgery to decompress, and there are no guarantees that the patient would recover from the paralysis. 4. Discitis-This is an infection of the intervertebral discs.  It occurs in about 1% of discography procedures.  It is difficult to treat and it may lead to surgery.        2. Pain: the needles have to go through skin and soft tissues, will cause soreness.       3. Damage to internal structures:  The nerves to be lesioned may be near blood vessels or    other nerves which  can be potentially damaged.       4. Bleeding: Bleeding is more common if the patient is taking blood thinners such as  aspirin, Coumadin, Ticiid, Plavix, etc., or if he/she have some genetic predisposition  such as hemophilia. Bleeding into the spinal canal can cause compression of the spinal  cord with subsequent paralysis.  This would require an emergency surgery to  decompress and there are no guarantees that the patient would recover from the  paralysis.       5. Pneumothorax:  Puncturing of a lung is a possibility, every time a needle is introduced in  the area of the chest or upper back.  Pneumothorax refers to free air around the  collapsed lung(s), inside of the thoracic cavity (chest cavity).  Another two possible  complications related to a similar event would include: Hemothorax and Chylothorax.   These are variations of the Pneumothorax, where instead of air around the collapsed  lung(s), you may have blood or chyle, respectively.       6. Spinal headaches: They may occur with any procedures in the area of the spine.       7. Persistent CSF (Cerebro-Spinal Fluid) leakage: This is a rare problem, but may occur  with prolonged intrathecal or epidural catheters either due to the formation of a fistulous  track or a dural tear.       8. Nerve damage: By working so close to the spinal cord, there is always a possibility of  nerve damage, which could be as serious as a permanent spinal cord injury with  paralysis.       9. Death:  Although rare, severe deadly allergic reactions known as "Anaphylactic  reaction" can occur to any of the medications used.      10. Worsening of the symptoms:  We can always make thing worse.  What are the chances of something like this happening? Chances of any of this occuring are extremely low.  By statistics, you have more of a chance of getting killed in a motor vehicle accident: while driving to the hospital than any of the above occurring .  Nevertheless, you  should be aware that they are possibilities.  In general, it is similar to taking a shower.  Everybody knows that you can slip, hit your head and get killed.  Does that mean that you should not shower again?  Nevertheless always keep in mind that statistics do not mean anything if you happen to be on the wrong side of them.  Even if a procedure has a 1 (one) in a 1,000,000 (million) chance of going wrong, it you happen to be that one..Also, keep in mind that by statistics, you have more of a chance of having something go wrong when taking medications.  Who should not have this procedure? If you are on a blood thinning medication (e.g. Coumadin, Plavix, see list of "Blood Thinners"), or if you have an active infection going on, you should not have the procedure.  If you are taking any blood thinners, please inform your physician.  How should I prepare for this procedure?  Do not eat or drink anything at least six hours prior to the procedure.  Bring a driver with you .  It cannot be a taxi.  Come accompanied by an adult that can drive you back, and that is strong enough to help you if your legs get weak or numb from the local anesthetic.  Take all of your medicines the morning of the procedure with just enough water to swallow them.  If you have diabetes, make sure that you are scheduled to have your procedure done first thing in the morning, whenever possible.  If you have diabetes, take only half of your insulin dose and notify our nurse that you have done so as soon as you arrive at the clinic.  If you are diabetic, but only take blood sugar pills (oral hypoglycemic), then do not take them on the morning of your procedure.  You may take them after you have had the procedure.  Do not take aspirin or any aspirin-containing medications, at least eleven (11) days prior to the procedure.  They may prolong bleeding.  Wear loose fitting clothing that may be easy to take off and that you would not  mind if it got stained with Betadine or blood.  Do not wear any jewelry or perfume  Remove any nail coloring.  It will interfere with some of our monitoring equipment.  NOTE: Remember that this is not meant to be interpreted as a complete list of all possible complications.  Unforeseen problems may occur.  BLOOD THINNERS The following drugs contain aspirin or other products, which can cause increased bleeding during surgery and should not be taken for 2 weeks prior to and 1 week after surgery.  If you should need take something for relief of minor pain, you may take acetaminophen which is found in Tylenol,m Datril, Anacin-3 and Panadol. It is not blood thinner. The products listed below are.  Do not take any of the products listed below in addition to any listed on your instruction sheet.  A.P.C or A.P.C with Codeine Codeine Phosphate Capsules #3 Ibuprofen Ridaura  ABC compound Congesprin Imuran rimadil  Advil Cope Indocin Robaxisal  Alka-Seltzer Effervescent Pain Reliever and Antacid Coricidin or Coricidin-D  Indomethacin Rufen  Alka-Seltzer plus Cold Medicine Cosprin Ketoprofen S-A-C Tablets  Anacin Analgesic Tablets or Capsules Coumadin Korlgesic Salflex  Anacin Extra Strength Analgesic tablets or capsules CP-2 Tablets Lanoril Salicylate  Anaprox Cuprimine Capsules Levenox Salocol  Anexsia-D Dalteparin Magan Salsalate  Anodynos Darvon compound Magnesium Salicylate Sine-off  Ansaid Dasin Capsules Magsal Sodium Salicylate  Anturane Depen Capsules Marnal Soma  APF Arthritis pain formula Dewitt's Pills Measurin Stanback  Argesic Dia-Gesic Meclofenamic Sulfinpyrazone  Arthritis Bayer Timed Release Aspirin Diclofenac Meclomen Sulindac  Arthritis pain formula Anacin Dicumarol Medipren Supac  Analgesic (Safety coated) Arthralgen Diffunasal Mefanamic Suprofen  Arthritis Strength Bufferin Dihydrocodeine Mepro Compound Suprol  Arthropan liquid Dopirydamole Methcarbomol with Aspirin Synalgos   ASA tablets/Enseals Disalcid Micrainin Tagament  Ascriptin Doan's Midol Talwin  Ascriptin A/D Dolene Mobidin Tanderil  Ascriptin Extra Strength Dolobid Moblgesic Ticlid  Ascriptin with Codeine Doloprin or Doloprin with Codeine Momentum Tolectin  Asperbuf Duoprin Mono-gesic Trendar  Aspergum Duradyne Motrin or Motrin IB Triminicin  Aspirin plain, buffered or enteric coated Durasal Myochrisine Trigesic  Aspirin Suppositories Easprin Nalfon Trillsate  Aspirin with Codeine Ecotrin Regular or Extra Strength Naprosyn Uracel  Atromid-S Efficin Naproxen Ursinus  Auranofin Capsules Elmiron Neocylate Vanquish  Axotal Emagrin Norgesic Verin  Azathioprine  Empirin or Empirin with Codeine Normiflo Vitamin E  Azolid Emprazil Nuprin Voltaren  Bayer Aspirin plain, buffered or children's or timed BC Tablets or powders Encaprin Orgaran Warfarin Sodium  Buff-a-Comp Enoxaparin Orudis Zorpin  Buff-a-Comp with Codeine Equegesic Os-Cal-Gesic   Buffaprin Excedrin plain, buffered or Extra Strength Oxalid   Bufferin Arthritis Strength Feldene Oxphenbutazone   Bufferin plain or Extra Strength Feldene Capsules Oxycodone with Aspirin   Bufferin with Codeine Fenoprofen Fenoprofen Pabalate or Pabalate-SF   Buffets II Flogesic Panagesic   Buffinol plain or Extra Strength Florinal or Florinal with Codeine Panwarfarin   Buf-Tabs Flurbiprofen Penicillamine   Butalbital Compound Four-way cold tablets Penicillin   Butazolidin Fragmin Pepto-Bismol   Carbenicillin Geminisyn Percodan   Carna Arthritis Reliever Geopen Persantine   Carprofen Gold's salt Persistin   Chloramphenicol Goody's Phenylbutazone   Chloromycetin Haltrain Piroxlcam   Clmetidine heparin Plaquenil   Cllnoril Hyco-pap Ponstel   Clofibrate Hydroxy chloroquine Propoxyphen         Before stopping any of these medications, be sure to consult the physician who ordered them.  Some, such as Coumadin (Warfarin) are ordered to prevent or treat serious  conditions such as "deep thrombosis", "pumonary embolisms", and other heart problems.  The amount of time that you may need off of the medication may also vary with the medication and the reason for which you were taking it.  If you are taking any of these medications, please make sure you notify your pain physician before you undergo any procedures.

## 2016-03-20 NOTE — Progress Notes (Signed)
Subjective:    Patient ID: Karen Edwards, female    DOB: [birth year], 50 y.o.   MRN: [id number]  HPI  PROCEDURE PERFORMED: Lumbar facet (medial branch block)   NOTE: The patient is a 50 y.o. female who returns to [facility] for further evaluation and treatment of pain involving the lumbar and lower extremity region. MRI  revealed the patient to be with evidence of degenerative changes lumbar spine L1-2 level degenerative disc disease with right central disc extrusion cranial migration of disc material   L1-L2: Small right paracentral disc protrusion. No spinal stenosis or nerve root encroachment.  L2-L3: Mild disc bulging. No spinal stenosis or nerve root encroachment.  L3-L4: Disc height and hydration are maintained. There is mild facet and ligamentous hypertrophy. No spinal stenosis or nerve root encroachment.  L4-L5: Mild disc bulging with mild facet and ligamentous hypertrophy. No spinal stenosis or nerve root encroachment.  L5-S1: Disc height and hydration are maintained. No spinal stenosis or nerve root encroachment.  IMPRESSION:  1. Small right paracentral disc protrusion at L1-L2. 2. Mild disc bulging and facet disease from L2-L3 through L4-L5. 3. No significant spinal stenosis or nerve root encroachment. 4. Suspected uterine fibroids,   . . There is concern regarding significant component of patient's pain began due to lumbar facet syndrome The risks, benefits, and expectations of the procedure have been discussed and explained to the patient who was understanding and in agreement with suggested treatment plan. We will proceed with interventional treatment as discussed and as explained to the patient who was understanding and wished to proceed with procedure as planned.   DESCRIPTION OF PROCEDURE: Lumbar facet (medial branch block) with IV Versed, IV fentanyl conscious sedation, EKG, blood pressure, pulse, capnography, and pulse  oximetry monitoring. The procedure was performed with the patient in the prone position. Betadine prep of proposed entry site performed.   NEEDLE PLACEMENT AT: Left L 2 lumbar facet (medial branch block). Under fluoroscopic guidance with oblique orientation of 15 degrees, a 22-gauge needle was inserted at the L 2 vertebral body level with needle placed at the targeted area of Burton's Eye or Eye of the Scotty Dog with documentation of needle placement in the superior and lateral border of targeted area of Burton's Eye or Eye of the Scotty Dog with oblique orientation of 15 degrees. Following documentation of needle placement at the L 2 vertebral body level, needle placement was then accomplished at the L 3 vertebral body level.   NEEDLE PLACEMENT AT L3, L4, and L5 VERTEBRAL BODY LEVELS ON THE LEFT SIDE The procedure was performed at the L3, L4, and L5 vertebral body levels exactly as was performed at the L 2 vertebral body level utilizing the same technique and under fluoroscopic guidance.  NEEDLE PLACEMENT AT THE SACRAL ALA with AP view of the lumbosacral spine. With the patient in the prone position, Betadine prep of proposed entry site accomplished, a 22 gauge needle was inserted in the region of the sacral ala (groove formed by the superior articulating process of S1 and the sacral wing). Following documentation of needle placement at the sacral ala,   Needle placement was then verified at all levels on lateral view. Following documentation of needle placement at all levels on lateral view and following negative aspiration for heme and CSF, each level was injected with 1 mL of 0.25% bupivacaine with Kenalog.     LUMBAR FACET, MEDIAL BRANCH NERVE, BLOCKS PERFORMED ON THE RIGHT SIDE   The procedure  was performed on the right side exactly as was performed on the left side at the same levels and utilizing the same technique under fluoroscopic guidance.     The patient tolerated the procedure  well. A total of 40 mg of Kenalog was utilized for the procedure.   PLAN:  1. Medications: The patient will continue presently prescribed medications hydrocodone acetaminophen and baclofen. The patient has been instructed to avoid taking Zanaflex. 2. May consider modification of treatment regimen at time of return appointment pending response to treatment rendered on today's visit. 3. The patient is to follow-up with primary care physician Dr. Gentry Fitz for further evaluation of blood pressure and general medical condition status post steroid injection performed on today's visit. 4. Surgical follow-up evaluation.. The patient will follow-up with Dr. Pamala Hurry as planned 5. Neurological follow-up evaluation. Has been addressed 6. The patient may be candidate for radiofrequency procedures, implantation type procedures, and other treatment pending response to treatment and follow-up evaluation. 7. The patient has been advised to call the [facility] prior to scheduled return appointment should there be significant change in condition or should patient have other concerns regarding condition prior to scheduled return appointment.  The patient is understanding and in agreement with suggested treatment plan.   Review of Systems     Objective:   Physical Exam        Assessment & Plan:

## 2016-03-21 ENCOUNTER — Encounter: Payer: Self-pay | Admitting: Primary Care

## 2016-03-21 ENCOUNTER — Encounter: Payer: Self-pay | Admitting: Pain Medicine

## 2016-03-21 ENCOUNTER — Telehealth: Payer: Self-pay | Admitting: *Deleted

## 2016-03-21 NOTE — Telephone Encounter (Signed)
Spoke with [facility], states that legs were not numb from procedure and she continues to have the pain that she was having.  Told patient that she will need to give the steroids time to work and to give it a few more days.  Denies any other questions or concerns.

## 2016-04-09 ENCOUNTER — Encounter: Payer: PPO | Admitting: Pain Medicine

## 2016-04-10 ENCOUNTER — Encounter: Payer: PPO | Admitting: Pain Medicine

## 2016-04-11 ENCOUNTER — Other Ambulatory Visit: Payer: Self-pay | Admitting: Primary Care

## 2016-04-12 DIAGNOSIS — Z9889 Other specified postprocedural states: Secondary | ICD-10-CM | POA: Diagnosis not present

## 2016-04-12 DIAGNOSIS — M5020 Other cervical disc displacement, unspecified cervical region: Secondary | ICD-10-CM | POA: Diagnosis not present

## 2016-04-15 ENCOUNTER — Encounter: Payer: PPO | Admitting: Pain Medicine

## 2016-04-19 ENCOUNTER — Encounter: Payer: Self-pay | Admitting: Primary Care

## 2016-04-24 ENCOUNTER — Encounter: Payer: PPO | Admitting: Pain Medicine

## 2016-05-06 ENCOUNTER — Ambulatory Visit: Payer: PPO | Attending: Pain Medicine | Admitting: Pain Medicine

## 2016-05-06 ENCOUNTER — Encounter: Payer: Self-pay | Admitting: Pain Medicine

## 2016-05-06 VITALS — BP 108/72 | HR 68 | Temp 98.6°F | Resp 16 | Ht 60.0 in | Wt 140.0 lb

## 2016-05-06 DIAGNOSIS — M5136 Other intervertebral disc degeneration, lumbar region: Secondary | ICD-10-CM

## 2016-05-06 DIAGNOSIS — G90523 Complex regional pain syndrome I of lower limb, bilateral: Secondary | ICD-10-CM

## 2016-05-06 DIAGNOSIS — M545 Low back pain, unspecified: Secondary | ICD-10-CM

## 2016-05-06 DIAGNOSIS — M542 Cervicalgia: Secondary | ICD-10-CM | POA: Diagnosis not present

## 2016-05-06 DIAGNOSIS — G894 Chronic pain syndrome: Secondary | ICD-10-CM

## 2016-05-06 DIAGNOSIS — M5116 Intervertebral disc disorders with radiculopathy, lumbar region: Secondary | ICD-10-CM | POA: Insufficient documentation

## 2016-05-06 DIAGNOSIS — M5416 Radiculopathy, lumbar region: Secondary | ICD-10-CM

## 2016-05-06 DIAGNOSIS — M47896 Other spondylosis, lumbar region: Secondary | ICD-10-CM | POA: Diagnosis not present

## 2016-05-06 DIAGNOSIS — M79606 Pain in leg, unspecified: Secondary | ICD-10-CM | POA: Diagnosis not present

## 2016-05-06 DIAGNOSIS — M50221 Other cervical disc displacement at C4-C5 level: Secondary | ICD-10-CM | POA: Insufficient documentation

## 2016-05-06 DIAGNOSIS — M5137 Other intervertebral disc degeneration, lumbosacral region: Secondary | ICD-10-CM

## 2016-05-06 DIAGNOSIS — M5010 Cervical disc disorder with radiculopathy, unspecified cervical region: Secondary | ICD-10-CM | POA: Insufficient documentation

## 2016-05-06 DIAGNOSIS — M791 Myalgia: Secondary | ICD-10-CM | POA: Diagnosis not present

## 2016-05-06 DIAGNOSIS — M5481 Occipital neuralgia: Secondary | ICD-10-CM | POA: Insufficient documentation

## 2016-05-06 DIAGNOSIS — M4802 Spinal stenosis, cervical region: Secondary | ICD-10-CM | POA: Diagnosis not present

## 2016-05-06 DIAGNOSIS — M533 Sacrococcygeal disorders, not elsewhere classified: Secondary | ICD-10-CM | POA: Diagnosis not present

## 2016-05-06 DIAGNOSIS — G8929 Other chronic pain: Secondary | ICD-10-CM

## 2016-05-06 DIAGNOSIS — G90521 Complex regional pain syndrome I of right lower limb: Secondary | ICD-10-CM

## 2016-05-06 DIAGNOSIS — M47817 Spondylosis without myelopathy or radiculopathy, lumbosacral region: Secondary | ICD-10-CM | POA: Diagnosis not present

## 2016-05-06 DIAGNOSIS — M47816 Spondylosis without myelopathy or radiculopathy, lumbar region: Secondary | ICD-10-CM

## 2016-05-06 DIAGNOSIS — M5030 Other cervical disc degeneration, unspecified cervical region: Secondary | ICD-10-CM

## 2016-05-06 MED ORDER — BACLOFEN 10 MG PO TABS: ORAL_TABLET | ORAL | Status: DC

## 2016-05-06 MED ORDER — HYDROCODONE-ACETAMINOPHEN 5-325 MG PO TABS: ORAL_TABLET | ORAL | Status: DC

## 2016-05-06 NOTE — Progress Notes (Signed)
Subjective:    Patient ID: Karen Edwards, female    DOB: 05-11-1966, 50 y.o.   MRN: [id number]  HPI  The patient is a 50 year old female who returns to pain management for further evaluation and treatment of pain involving the neck upper extremity regions lower back and lower extremity region. The patient is undergone surgical intervention of the cervical region and has also been cleared to undergo interventional treatment of pain involving the lumbar and lower extremity regions. The patient denies any trauma change in events of daily living call significant change in symptomatology and states that the pain of the lower back lower extremity regions quite severe at times especially with standing and walking for an significant period of time. The patient denies any trauma change in events of daily living the call significant change in symptomatology. The patient will continue baclofen and hydrocodone acetaminophen and we will schedule patient to undergo lumbosacral selective nerve root block to be performed at time of return appointment. All agreed to suggested treatment plan.  Review of Systems     Objective:   Physical Exam  There was tenderness over the splenius capitis and occipitalis region a mild degree with well-healed surgical scar of the anterior cervical region on the left without increased warmth erythema in the region scar. The patient was with decreased range of motion of the cervical spine without radiation of pain to the upper extremities noted. The patient was with decreased grip strength with Tinel and Phalen's maneuver reproducing minimal discomfort. Palpation of the acromioclavicular and glenohumeral joint regions reproduced pain of mild degree and patient was at unremarkable drop test. Palpation over the thoracic region was attends to palpation of the lower thoracic region with moderate muscle spasms noted with palpation over the lumbar paraspinal musculature region lumbar facet  region reproducing moderate to moderately severe discomfort with lateral bending rotation extension and palpation over the lumbar facets reproducing moderately severe discomfort. Straight leg raise was tolerated to approximately 30 without increase of pain with dorsiflexion noted. EHL strength appeared to be decreased with no definite sensory deficit or dermatomal distribution detected. There was negative clonus negative Homans. Knees were with crepitus of the knees with negative anterior and posterior drawer signs. Palpation over the PSIS and PII S region reproduced moderate discomfort. 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 No spinal stenosis or nerve root encroachment.  L2-L3: Mild disc bulging. No spinal stenosis or nerve root encroachment.  L3-L4: Disc height and hydration are maintained. There is mild facet and ligamentous hypertrophy. No spinal stenosis or nerve root encroachment.  L4-L5: Mild disc bulging with mild facet and ligamentous hypertrophy. No spinal stenosis or nerve root encroachment.  L5-S1: Disc height and hydration are maintained. No spinal stenosis or nerve root encroachment.  IMPRESSION:  1. Small right paracentral disc protrusion at L1-L2. 2. Mild disc bulging and facet disease from L2-L3 through L4-L5. 3. No significant spinal stenosis or nerve root encroachment. 4. 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 C5-C6 spondylosis with narrowing of  the ventral  subarachnoid space and bilateral neural foraminal stenosis that could affect either or both C6 nerve roots. C6-C7 broad-based disc herniation narrows the spinal canal but does not compress the cord shows foraminal extension. C5-C6 and C6-C7 abnormalities noted with C4-C5 changes apparently have worsened since prior study  Status post cervical surgery  Cervical stenosis  Cervical radiculopathy  Cervical facet syndrome       PLAN   Continue present medications  hydrocodone acetaminophen and baclofen. Do not take Zanaflex (TIZANIDINE)   Lumbosacral selective nerve root block to be performed at time of return appointment  F/U PCP Dr. Carlis Abbott for evaluation of  BP and general medical condition  F/U with neurosurgeon as discussed Patient will undergo follow-up neurosurgical evaluation status post surgery of cervical region   F/U neurological evaluation. May consider PNCV/EMG studies and other studies pending follow-up evaluation  May consider radiofrequency rhizolysis or intraspinal procedures pending response to present treatment and F/U evaluation  Patient to call pain management for any change in condition or concerns prior to scheduled appointment

## 2016-05-06 NOTE — Patient Instructions (Addendum)
PLAN   Continue present medications  hydrocodone acetaminophen and baclofen. Do not take Zanaflex (TIZANIDINE)   Lumbosacral selective nerve root block to be performed at time of return appointment  F/U PCP Dr. Carlis Abbott for evaluation of  BP and general medical condition  F/U with neurosurgeon as discussed Patient will follow-up with Dr. Pamala Hurry status post surgery of cervical region   F/U neurological evaluation. May consider PNCV/EMG studies and other studies pending follow-up evaluation  May consider radiofrequency rhizolysis or intraspinal procedures pending response to present treatment and F/U evaluation  Patient to call pain management for any change in condition or concerns prior to scheduled appointmentSelective Nerve Root Block Patient Information  Description: Specific nerve roots exit the spinal canal and these nerves can be compressed and inflamed by a bulging disc and bone spurs.  By injecting steroids on the nerve root, we can potentially decrease the inflammation surrounding these nerves, which often leads to decreased pain.  Also, by injecting local anesthesia on the nerve root, this can provide [place] helpful information to give to your referring doctor if it decreases your pain.  Selective nerve root blocks can be done along the spine from the neck to the low back depending on the location of your pain.   After numbing the skin with local anesthesia, a small needle is passed to the nerve root and the position of the needle is verified using x-ray pictures.  After the needle is in correct position, we then deposit the medication.  You may experience a pressure sensation while this is being done.  The entire block usually lasts less than 15 minutes.  Conditions that may be treated with selective nerve root blocks:  Low back and leg pain  Spinal stenosis  Diagnostic block prior to potential surgery  Neck and arm pain  Post laminectomy syndrome  Preparation for the  injection:  1. Do not eat any solid food or dairy products within 8 hours of your appointment. 2. You may drink clear liquids up to 3 hours before an appointment.  Clear liquids include water, black coffee, juice or soda.  No milk or cream please. 3. You may take your regular medications, including pain medications, with a sip of water before your appointment.  Diabetics should hold regular insulin (if taken separately) and take 1/2 normal NPH dose the morning of the procedure.  Carry some sugar containing items with you to your appointment. 4. A driver must accompany you and be prepared to drive you home after your procedure. 5. Bring all your current medications with you. 6. An IV may be inserted and sedation may be given at the discretion of the physician. 7. A blood pressure cuff, EKG, and other monitors will often be applied during the procedure.  Some patients may need to have extra oxygen administered for a short period. 8. You will be asked to provide medical information, including allergies, prior to the procedure.  We must know immediately if you are taking blood  Thinners (like Coumadin) or if you are allergic to IV iodine contrast (dye).  Possible side-effects: All are usually temporary  Bleeding from needle site  Light headedness  Numbness and tingling  Decreased blood pressure  Weakness in arms/legs  Pressure sensation in back/neck  Pain at injection site (several days)  Possible complications: All are extremely rare  Infection  Nerve injury  Spinal headache (a headache wore with upright position)  Call if you experience:  Fever/chills associated with headache or increased back/neck pain  Headache worsened by an upright position  New onset weakness or numbness of an extremity below the injection site  Hives or difficulty breathing (go to the emergency room)  Inflammation or drainage at the injection site(s)  Severe back/neck pain greater than  usual  New symptoms which are concerning to you  Please note:  Although the local anesthetic injected can often make your back or neck feel good for several hours after the injection the pain will likely return.  It takes 3-5 days for steroids to work on the nerve root. You may not notice any pain relief for at least one week.  If effective, we will often do a series of 3 injections spaced 3-6 weeks apart to maximally decrease your pain.    If you have any questions, please call [phone] [facility] ClinicGENERAL RISKS AND COMPLICATIONS  What are the risk, side effects and possible complications? Generally speaking, most procedures are safe.  However, with any procedure there are risks, side effects, and the possibility of complications.  The risks and complications are dependent upon the sites that are lesioned, or the type of nerve block to be performed.  The closer the procedure is to the spine, the more serious the risks are.  Great care is taken when placing the radio frequency needles, block needles or lesioning probes, but sometimes complications can occur. 1. Infection: Any time there is an injection through the skin, there is a risk of infection.  This is why sterile conditions are used for these blocks.  There are four possible types of infection. 1. Localized skin infection. 2. Central Nervous System Infection-This can be in the form of Meningitis, which can be deadly. 3. Epidural Infections-This can be in the form of an epidural abscess, which can cause pressure inside of the spine, causing compression of the spinal cord with subsequent paralysis. This would require an emergency surgery to decompress, and there are no guarantees that the patient would recover from the paralysis. 4. Discitis-This is an infection of the intervertebral discs.  It occurs in about 1% of discography procedures.  It is difficult to treat and it may lead to surgery.         2. Pain: the needles have to go through skin and soft tissues, will cause soreness.       3. Damage to internal structures:  The nerves to be lesioned may be near blood vessels or    other nerves which can be potentially damaged.       4. Bleeding: Bleeding is more common if the patient is taking blood thinners such as  aspirin, Coumadin, Ticiid, Plavix, etc., or if he/she have some genetic predisposition  such as hemophilia. Bleeding into the spinal canal can cause compression of the spinal  cord with subsequent paralysis.  This would require an emergency surgery to  decompress and there are no guarantees that the patient would recover from the  paralysis.       5. Pneumothorax:  Puncturing of a lung is a possibility, every time a needle is introduced in  the area of the chest or upper back.  Pneumothorax refers to free air around the  collapsed lung(s), inside of the thoracic cavity (chest cavity).  Another two possible  complications related to a similar event would include: Hemothorax and Chylothorax.   These are variations of the Pneumothorax, where instead of air around the collapsed  lung(s), you may have blood or chyle, respectively.  6. Spinal headaches: They may occur with any procedures in the area of the spine.       7. Persistent CSF (Cerebro-Spinal Fluid) leakage: This is a rare problem, but may occur  with prolonged intrathecal or epidural catheters either due to the formation of a fistulous  track or a dural tear.       8. Nerve damage: By working so close to the spinal cord, there is always a possibility of  nerve damage, which could be as serious as a permanent spinal cord injury with  paralysis.       9. Death:  Although rare, severe deadly allergic reactions known as "Anaphylactic  reaction" can occur to any of the medications used.      10. Worsening of the symptoms:  We can always make thing worse.  What are the chances of something like this happening? Chances of any of this  occuring are extremely low.  By statistics, you have more of a chance of getting killed in a motor vehicle accident: while driving to the hospital than any of the above occurring .  Nevertheless, you should be aware that they are possibilities.  In general, it is similar to taking a shower.  Everybody knows that you can slip, hit your head and get killed.  Does that mean that you should not shower again?  Nevertheless always keep in mind that statistics do not mean anything if you happen to be on the wrong side of them.  Even if a procedure has a 1 (one) in a 1,000,000 (million) chance of going wrong, it you happen to be that one..Also, keep in mind that by statistics, you have more of a chance of having something go wrong when taking medications.  Who should not have this procedure? If you are on a blood thinning medication (e.g. Coumadin, Plavix, see list of "Blood Thinners"), or if you have an active infection going on, you should not have the procedure.  If you are taking any blood thinners, please inform your physician.  How should I prepare for this procedure?  Do not eat or drink anything at least six hours prior to the procedure.  Bring a driver with you .  It cannot be a taxi.  Come accompanied by an adult that can drive you back, and that is strong enough to help you if your legs get weak or numb from the local anesthetic.  Take all of your medicines the morning of the procedure with just enough water to swallow them.  If you have diabetes, make sure that you are scheduled to have your procedure done first thing in the morning, whenever possible.  If you have diabetes, take only half of your insulin dose and notify our nurse that you have done so as soon as you arrive at the clinic.  If you are diabetic, but only take blood sugar pills (oral hypoglycemic), then do not take them on the morning of your procedure.  You may take them after you have had the procedure.  Do not take aspirin  or any aspirin-containing medications, at least eleven (11) days prior to the procedure.  They may prolong bleeding.  Wear loose fitting clothing that may be easy to take off and that you would not mind if it got stained with Betadine or blood.  Do not wear any jewelry or perfume  Remove any nail coloring.  It will interfere with some of our monitoring equipment.  NOTE: Remember that this is  not meant to be interpreted as a complete list of all possible complications.  Unforeseen problems may occur.  BLOOD THINNERS The following drugs contain aspirin or other products, which can cause increased bleeding during surgery and should not be taken for 2 weeks prior to and 1 week after surgery.  If you should need take something for relief of minor pain, you may take acetaminophen which is found in Tylenol,m Datril, Anacin-3 and Panadol. It is not blood thinner. The products listed below are.  Do not take any of the products listed below in addition to any listed on your instruction sheet.  A.P.C or A.P.C with Codeine Codeine Phosphate Capsules #3 Ibuprofen Ridaura  ABC compound Congesprin Imuran rimadil  Advil Cope Indocin Robaxisal  Alka-Seltzer Effervescent Pain Reliever and Antacid Coricidin or Coricidin-D  Indomethacin Rufen  Alka-Seltzer plus Cold Medicine Cosprin Ketoprofen S-A-C Tablets  Anacin Analgesic Tablets or Capsules Coumadin Korlgesic Salflex  Anacin Extra Strength Analgesic tablets or capsules CP-2 Tablets Lanoril Salicylate  Anaprox Cuprimine Capsules Levenox Salocol  Anexsia-D Dalteparin Magan Salsalate  Anodynos Darvon compound Magnesium Salicylate Sine-off  Ansaid Dasin Capsules Magsal Sodium Salicylate  Anturane Depen Capsules Marnal Soma  APF Arthritis pain formula Dewitt's Pills Measurin Stanback  Argesic Dia-Gesic Meclofenamic Sulfinpyrazone  Arthritis Bayer Timed Release Aspirin Diclofenac Meclomen Sulindac  Arthritis pain formula Anacin Dicumarol Medipren Supac   Analgesic (Safety coated) Arthralgen Diffunasal Mefanamic Suprofen  Arthritis Strength Bufferin Dihydrocodeine Mepro Compound Suprol  Arthropan liquid Dopirydamole Methcarbomol with Aspirin Synalgos  ASA tablets/Enseals Disalcid Micrainin Tagament  Ascriptin Doan's Midol Talwin  Ascriptin A/D Dolene Mobidin Tanderil  Ascriptin Extra Strength Dolobid Moblgesic Ticlid  Ascriptin with Codeine Doloprin or Doloprin with Codeine Momentum Tolectin  Asperbuf Duoprin Mono-gesic Trendar  Aspergum Duradyne Motrin or Motrin IB Triminicin  Aspirin plain, buffered or enteric coated Durasal Myochrisine Trigesic  Aspirin Suppositories Easprin Nalfon Trillsate  Aspirin with Codeine Ecotrin Regular or Extra Strength Naprosyn Uracel  Atromid-S Efficin Naproxen Ursinus  Auranofin Capsules Elmiron Neocylate Vanquish  Axotal Emagrin Norgesic Verin  Azathioprine Empirin or Empirin with Codeine Normiflo Vitamin E  Azolid Emprazil Nuprin Voltaren  Bayer Aspirin plain, buffered or children's or timed BC Tablets or powders Encaprin Orgaran Warfarin Sodium  Buff-a-Comp Enoxaparin Orudis Zorpin  Buff-a-Comp with Codeine Equegesic Os-Cal-Gesic   Buffaprin Excedrin plain, buffered or Extra Strength Oxalid   Bufferin Arthritis Strength Feldene Oxphenbutazone   Bufferin plain or Extra Strength Feldene Capsules Oxycodone with Aspirin   Bufferin with Codeine Fenoprofen Fenoprofen Pabalate or Pabalate-SF   Buffets II Flogesic Panagesic   Buffinol plain or Extra Strength Florinal or Florinal with Codeine Panwarfarin   Buf-Tabs Flurbiprofen Penicillamine   Butalbital Compound Four-way cold tablets Penicillin   Butazolidin Fragmin Pepto-Bismol   Carbenicillin Geminisyn Percodan   Carna Arthritis Reliever Geopen Persantine   Carprofen Gold's salt Persistin   Chloramphenicol Goody's Phenylbutazone   Chloromycetin Haltrain Piroxlcam   Clmetidine heparin Plaquenil   Cllnoril Hyco-pap Ponstel   Clofibrate Hydroxy  chloroquine Propoxyphen         Before stopping any of these medications, be sure to consult the physician who ordered them.  Some, such as Coumadin (Warfarin) are ordered to prevent or treat serious conditions such as "deep thrombosis", "pumonary embolisms", and other heart problems.  The amount of time that you may need off of the medication may also vary with the medication and the reason for which you were taking it.  If you are taking any of these medications, please make sure  you notify your pain physician before you undergo any procedures.

## 2016-05-06 NOTE — Progress Notes (Signed)
Safety precautions to be maintained throughout the outpatient stay will include: orient to surroundings, keep bed in low position, maintain call bell within reach at all times, provide assistance with transfer out of bed and ambulation.  

## 2016-05-31 ENCOUNTER — Telehealth: Payer: Self-pay | Admitting: Pain Medicine

## 2016-05-31 NOTE — Telephone Encounter (Signed)
Juliann Pulse and  [place] ,  Please inform patient that we can prescribe medications and perform procedure on Wednesday, June 05, 2016

## 2016-05-31 NOTE — Telephone Encounter (Signed)
Patient has appt on 8-8 and 8-9 can she get meds on 8-9 with procedure and cancel 8-8 appt. ?

## 2016-06-03 NOTE — Telephone Encounter (Signed)
Patient notified and appointment cnld from schedule

## 2016-06-04 ENCOUNTER — Ambulatory Visit: Payer: PPO | Admitting: Pain Medicine

## 2016-06-05 ENCOUNTER — Ambulatory Visit: Payer: PPO | Attending: Pain Medicine | Admitting: Pain Medicine

## 2016-06-05 ENCOUNTER — Encounter: Payer: Self-pay | Admitting: Pain Medicine

## 2016-06-05 VITALS — BP 107/67 | HR 54 | Temp 98.6°F | Resp 14 | Ht 61.0 in | Wt 140.0 lb

## 2016-06-05 DIAGNOSIS — M5137 Other intervertebral disc degeneration, lumbosacral region: Secondary | ICD-10-CM

## 2016-06-05 DIAGNOSIS — M533 Sacrococcygeal disorders, not elsewhere classified: Secondary | ICD-10-CM

## 2016-06-05 DIAGNOSIS — M5481 Occipital neuralgia: Secondary | ICD-10-CM

## 2016-06-05 DIAGNOSIS — M79606 Pain in leg, unspecified: Secondary | ICD-10-CM | POA: Diagnosis not present

## 2016-06-05 DIAGNOSIS — G8929 Other chronic pain: Secondary | ICD-10-CM

## 2016-06-05 DIAGNOSIS — M5416 Radiculopathy, lumbar region: Secondary | ICD-10-CM | POA: Diagnosis not present

## 2016-06-05 DIAGNOSIS — M5126 Other intervertebral disc displacement, lumbar region: Secondary | ICD-10-CM | POA: Insufficient documentation

## 2016-06-05 DIAGNOSIS — M5030 Other cervical disc degeneration, unspecified cervical region: Secondary | ICD-10-CM

## 2016-06-05 DIAGNOSIS — G90523 Complex regional pain syndrome I of lower limb, bilateral: Secondary | ICD-10-CM

## 2016-06-05 DIAGNOSIS — M545 Low back pain, unspecified: Secondary | ICD-10-CM

## 2016-06-05 DIAGNOSIS — G90521 Complex regional pain syndrome I of right lower limb: Secondary | ICD-10-CM

## 2016-06-05 DIAGNOSIS — M5136 Other intervertebral disc degeneration, lumbar region: Secondary | ICD-10-CM

## 2016-06-05 DIAGNOSIS — M47816 Spondylosis without myelopathy or radiculopathy, lumbar region: Secondary | ICD-10-CM

## 2016-06-05 DIAGNOSIS — G894 Chronic pain syndrome: Secondary | ICD-10-CM

## 2016-06-05 DIAGNOSIS — M4802 Spinal stenosis, cervical region: Secondary | ICD-10-CM

## 2016-06-05 DIAGNOSIS — M5010 Cervical disc disorder with radiculopathy, unspecified cervical region: Secondary | ICD-10-CM

## 2016-06-05 MED ORDER — BUPIVACAINE HCL (PF) 0.5 % IJ SOLN
30.0000 mL | Freq: Once | INTRAMUSCULAR | Status: AC
  Administered 2016-06-05: 30 mL
  Filled 2016-06-05: qty 30

## 2016-06-05 MED ORDER — HYDROCODONE-ACETAMINOPHEN 5-325 MG PO TABS: ORAL_TABLET | ORAL | 0 refills | Status: DC

## 2016-06-05 MED ORDER — ORPHENADRINE CITRATE 30 MG/ML IJ SOLN
60.0000 mg | Freq: Once | INTRAMUSCULAR | Status: AC
  Administered 2016-06-05: 60 mg via INTRAMUSCULAR
  Filled 2016-06-05: qty 2

## 2016-06-05 MED ORDER — LIDOCAINE HCL (PF) 1 % IJ SOLN
10.0000 mL | Freq: Once | INTRAMUSCULAR | Status: AC
  Administered 2016-06-05: 10 mL via SUBCUTANEOUS

## 2016-06-05 MED ORDER — MIDAZOLAM HCL 5 MG/5ML IJ SOLN
5.0000 mg | Freq: Once | INTRAMUSCULAR | Status: AC
  Administered 2016-06-05: 5 mg via INTRAVENOUS
  Filled 2016-06-05: qty 5

## 2016-06-05 MED ORDER — BACLOFEN 10 MG PO TABS: ORAL_TABLET | ORAL | 0 refills | Status: DC

## 2016-06-05 MED ORDER — LACTATED RINGERS IV SOLN
1000.0000 mL | INTRAVENOUS | Status: DC
  Administered 2016-06-05: 1000 mL via INTRAVENOUS

## 2016-06-05 MED ORDER — CEFAZOLIN IN D5W 1 GM/50ML IV SOLN
1.0000 g | Freq: Once | INTRAVENOUS | Status: AC
  Administered 2016-06-05: 1 g via INTRAVENOUS

## 2016-06-05 MED ORDER — TRIAMCINOLONE ACETONIDE 40 MG/ML IJ SUSP
40.0000 mg | Freq: Once | INTRAMUSCULAR | Status: AC
  Administered 2016-06-05: 40 mg
  Filled 2016-06-05: qty 1

## 2016-06-05 MED ORDER — CEFUROXIME AXETIL 250 MG PO TABS: 250.0000 mg | ORAL_TABLET | Freq: Two times a day (BID) | ORAL | 0 refills | Status: DC

## 2016-06-05 MED ORDER — FENTANYL CITRATE (PF) 100 MCG/2ML IJ SOLN
100.0000 ug | Freq: Once | INTRAMUSCULAR | Status: AC
  Administered 2016-06-05: 100 ug via INTRAVENOUS
  Filled 2016-06-05: qty 2

## 2016-06-05 NOTE — Progress Notes (Signed)
    PROCEDURE PERFORMED: Lumbosacral selective nerve root block   NOTE: The patient is a 50 y.o. female who returns to [facility] for further evaluation and treatment of pain involving the lumbar and lower extremity region. Studies consisting of MRI has revealed the patient to be with evidence of  1. Small right paracentral disc protrusion at L1-L2. 2. Mild disc bulging and facet disease from L2-L3 through L4-L5. 3. No significant spinal stenosis or nerve root encroachment. 4. Suspected uterine fibroids,. There is concern regarding intraspinal abnormalities contributing to the patient's symptomatology with concern regarding component of patient's pain began due to lumbar radiculopathy The risks, benefits, and expectations of the procedure have been explained to the patient who was understanding and in agreement with suggested treatment plan. We will proceed with interventional treatment as discussed and as explained to the patient. The patient is understanding and in agreement with suggested treatment plan.   DESCRIPTION OF PROCEDURE: Lumbosacral selective nerve root block with IV Versed, IV fentanyl conscious sedation, EKG, blood pressure, pulse, capnography, and pulse oximetry monitoring. The procedure was performed with the patient in the prone position under fluoroscopic guidance. With the patient in the prone position, Betadine prep of proposed entry site was performed. Local anesthetic skin wheal of proposed needle entry site was prepared with 1.5% plain lidocaine with AP view of the lumbosacral spine.   PROCEDURE #1: Needle placement at the right L 2 vertebral body: A 22 -gauge needle was inserted at the inferior border of the transverse process of the vertebral body with needle placed medial to the midline of the transverse process on AP view of the lumbosacral spine.   NEEDLE PLACEMENT AT  L3, L4, and L5  VERTEBRAL BODY LEVELS  Needle  placement was accomplished at L3, L4,  and L5  vertebral body levels on the right side exactly as was accomplished at the L2  vertebral body level  and utilizing the same technique and under fluoroscopic guidance.   Needle placement was then verified on lateral view at all levels with needle tip documented to be in the posterior superior quadrant of the intervertebral foramen of  L 2, L3, L4, and L5. Following negative aspiration for heme and CSF at each level, each level was injected with 3 mL of 0.25% bupivacaine with Kenalog.   LUMBOSACRAL SELECTIVE NERVE ROOT BLOCKS THE THE  RIGHT SIDE  The procedure was performed on the right side exactly as was performed on the left side and at the same levels  Under fluoroscopic guidance and utilizing the same technique.    The patient tolerated the procedure well. A total of 10 mg of Kenalog was utilized for the procedure.   PLAN:  1. Medications: Will continue presently prescribed medications. Baclofen and hydrocodone acetaminophen 2. The patient is to undergo follow-up evaluation with PCP for evaluation of blood pressure and general medical condition status post procedure performed on today's visit. 3. Surgical follow-up evaluation.. Patient will follow-up with Dr. Pamala Hurry as discussed 4. Neurological evaluation. Has been addressed 5. May consider radiofrequency procedures, implantation type procedures and other treatment pending response to treatment and follow-up evaluation. 6. The patient has been advised do adhere to proper body mechanics and avoid activities which may aggravate condition. 7. The patient has been advised to call the [facility] prior to scheduled return appointment should there be significant change in the patient's condition or should the patient have other concerns regarding condition prior to scheduled return appointment.

## 2016-06-05 NOTE — Patient Instructions (Addendum)
PLAN   Continue present medications  hydrocodone acetaminophen and baclofen. Do not take Zanaflex (TIZANIDINE) Please obtain Ceftin antibiotic today and begin taking Ceftin antibiotic today as prescribed  F/U PCP Dr. Carlis Abbott for evaliation of  BP and general medical condition  F/U with neurosurgeon as discussed Patient will follow-up with Dr. Pamala Hurry status post surgery of cervical region and for further evaluation of lumbar and lower extremity region  F/U neurological evaluation. May consider pending follow-up evaluation  May consider radiofrequency rhizolysis or intraspinal procedures pending response to present treatment and F/U evaluation  Patient to call pain management for any change in condition or concerns prior to scheduled appointmentPain Management Discharge Instructions  General Discharge Instructions :  If you need to reach your doctor call: Monday-Friday 8:00 am - 4:00 pm at [phone] or toll free [phone].  After clinic hours [phone] to have operator reach doctor.  Bring all of your medication bottles to all your appointments in the pain clinic.  To cancel or reschedule your appointment with Pain Management please remember to call 24 hours in advance to avoid a fee.  Refer to the educational materials which you have been given on: General Risks, I had my Procedure. Discharge Instructions, Post Sedation.  Post Procedure Instructions:  The drugs you were given will stay in your system until tomorrow, so for the next 24 hours you should not drive, make any legal decisions or drink any alcoholic beverages.  You may eat anything you prefer, but it is better to start with liquids then soups and crackers, and gradually work up to solid foods.  Please notify your doctor immediately if you have any unusual bleeding, trouble breathing or pain that is not related to your normal pain.  Depending on the type of procedure that was done, some parts of your body may feel  week and/or numb.  This usually clears up by tonight or the next day.  Walk with the use of an assistive device or accompanied by an adult for the 24 hours.  You may use ice on the affected area for the first 24 hours.  Put ice in a Ziploc bag and cover with a towel and place against area 15 minutes on 15 minutes off.  You may switch to heat after 24 hours.

## 2016-06-06 ENCOUNTER — Telehealth: Payer: Self-pay

## 2016-06-06 NOTE — Telephone Encounter (Signed)
No answer left message to call if needed. 

## 2016-06-14 ENCOUNTER — Other Ambulatory Visit: Payer: Self-pay | Admitting: Primary Care

## 2016-06-17 NOTE — Telephone Encounter (Signed)
Ok to refill? Electronically refill request for   FLUoxetine (PROZAC) 20 MG capsule  Last prescribed on 03/12/2016. Last seen on 01/09/2016. CPE on 07/11/2016.

## 2016-06-25 ENCOUNTER — Telehealth: Payer: Self-pay | Admitting: *Deleted

## 2016-06-27 DIAGNOSIS — H04123 Dry eye syndrome of bilateral lacrimal glands: Secondary | ICD-10-CM | POA: Diagnosis not present

## 2016-07-03 ENCOUNTER — Other Ambulatory Visit: Payer: Self-pay | Admitting: Internal Medicine

## 2016-07-03 DIAGNOSIS — Z0000 Encounter for general adult medical examination without abnormal findings: Secondary | ICD-10-CM

## 2016-07-04 ENCOUNTER — Other Ambulatory Visit: Payer: Self-pay | Admitting: Primary Care

## 2016-07-04 ENCOUNTER — Encounter: Payer: PPO | Admitting: Pain Medicine

## 2016-07-04 DIAGNOSIS — J029 Acute pharyngitis, unspecified: Secondary | ICD-10-CM

## 2016-07-04 DIAGNOSIS — H169 Unspecified keratitis: Secondary | ICD-10-CM | POA: Diagnosis not present

## 2016-07-08 ENCOUNTER — Encounter: Payer: Self-pay | Admitting: Pain Medicine

## 2016-07-08 ENCOUNTER — Ambulatory Visit: Payer: PPO | Attending: Pain Medicine | Admitting: Pain Medicine

## 2016-07-08 ENCOUNTER — Other Ambulatory Visit: Payer: PPO

## 2016-07-08 VITALS — BP 114/72 | HR 79 | Temp 98.1°F | Resp 18 | Ht 61.0 in | Wt 135.0 lb

## 2016-07-08 DIAGNOSIS — M5030 Other cervical disc degeneration, unspecified cervical region: Secondary | ICD-10-CM

## 2016-07-08 DIAGNOSIS — M50221 Other cervical disc displacement at C4-C5 level: Secondary | ICD-10-CM | POA: Insufficient documentation

## 2016-07-08 DIAGNOSIS — M4722 Other spondylosis with radiculopathy, cervical region: Secondary | ICD-10-CM | POA: Diagnosis not present

## 2016-07-08 DIAGNOSIS — Z9889 Other specified postprocedural states: Secondary | ICD-10-CM | POA: Insufficient documentation

## 2016-07-08 DIAGNOSIS — M47896 Other spondylosis, lumbar region: Secondary | ICD-10-CM | POA: Diagnosis not present

## 2016-07-08 DIAGNOSIS — M5481 Occipital neuralgia: Secondary | ICD-10-CM | POA: Insufficient documentation

## 2016-07-08 DIAGNOSIS — M5116 Intervertebral disc disorders with radiculopathy, lumbar region: Secondary | ICD-10-CM | POA: Insufficient documentation

## 2016-07-08 DIAGNOSIS — M5136 Other intervertebral disc degeneration, lumbar region: Secondary | ICD-10-CM

## 2016-07-08 DIAGNOSIS — M5126 Other intervertebral disc displacement, lumbar region: Secondary | ICD-10-CM | POA: Diagnosis not present

## 2016-07-08 DIAGNOSIS — G90523 Complex regional pain syndrome I of lower limb, bilateral: Secondary | ICD-10-CM

## 2016-07-08 DIAGNOSIS — M533 Sacrococcygeal disorders, not elsewhere classified: Secondary | ICD-10-CM | POA: Insufficient documentation

## 2016-07-08 DIAGNOSIS — M5010 Cervical disc disorder with radiculopathy, unspecified cervical region: Secondary | ICD-10-CM | POA: Diagnosis not present

## 2016-07-08 DIAGNOSIS — M5137 Other intervertebral disc degeneration, lumbosacral region: Secondary | ICD-10-CM

## 2016-07-08 DIAGNOSIS — M5416 Radiculopathy, lumbar region: Secondary | ICD-10-CM | POA: Diagnosis not present

## 2016-07-08 DIAGNOSIS — M4802 Spinal stenosis, cervical region: Secondary | ICD-10-CM | POA: Insufficient documentation

## 2016-07-08 DIAGNOSIS — M47817 Spondylosis without myelopathy or radiculopathy, lumbosacral region: Secondary | ICD-10-CM | POA: Diagnosis not present

## 2016-07-08 DIAGNOSIS — M542 Cervicalgia: Secondary | ICD-10-CM | POA: Diagnosis not present

## 2016-07-08 DIAGNOSIS — M791 Myalgia: Secondary | ICD-10-CM | POA: Diagnosis not present

## 2016-07-08 DIAGNOSIS — M47816 Spondylosis without myelopathy or radiculopathy, lumbar region: Secondary | ICD-10-CM

## 2016-07-08 DIAGNOSIS — M545 Low back pain: Secondary | ICD-10-CM | POA: Diagnosis not present

## 2016-07-08 MED ORDER — BACLOFEN 10 MG PO TABS: ORAL_TABLET | ORAL | 0 refills | Status: DC

## 2016-07-08 MED ORDER — HYDROCODONE-ACETAMINOPHEN 5-325 MG PO TABS: ORAL_TABLET | ORAL | 0 refills | Status: DC

## 2016-07-08 NOTE — Progress Notes (Signed)
Safety precautions to be maintained throughout the outpatient stay will include: orient to surroundings, keep bed in low position, maintain call bell within reach at all times, provide assistance with transfer out of bed and ambulation.  

## 2016-07-08 NOTE — Patient Instructions (Signed)
PLAN   Continue present medications  hydrocodone acetaminophen and baclofen. Do not take Zanaflex (TIZANIDINE)   F/U PCP Dr. Carlis Abbott for evaluation of  BP and general medical condition  F/U with neurosurgeon as discussed Patient will follow-up with Dr. Pamala Hurry status post surgery of cervical region   F/U neurological evaluation. May consider PNCV/EMG studies and other studies pending follow-up evaluation  May consider radiofrequency rhizolysis or intraspinal procedures pending response to present treatment and F/U evaluation  Patient to call pain management for any change in condition or concerns prior to scheduled appointment

## 2016-07-09 ENCOUNTER — Other Ambulatory Visit (INDEPENDENT_AMBULATORY_CARE_PROVIDER_SITE_OTHER): Payer: PPO

## 2016-07-09 DIAGNOSIS — Z0000 Encounter for general adult medical examination without abnormal findings: Secondary | ICD-10-CM

## 2016-07-09 LAB — COMPREHENSIVE METABOLIC PANEL
ALBUMIN: 4.3 g/dL (ref 3.5–5.2)
ALT: 19 U/L (ref 0–35)
AST: 17 U/L (ref 0–37)
Alkaline Phosphatase: 50 U/L (ref 39–117)
BILIRUBIN TOTAL: 0.3 mg/dL (ref 0.2–1.2)
BUN: 8 mg/dL (ref 6–23)
CHLORIDE: 102 meq/L (ref 96–112)
CO2: 30 mEq/L (ref 19–32)
CREATININE: 0.77 mg/dL (ref 0.40–1.20)
Calcium: 9.2 mg/dL (ref 8.4–10.5)
GFR: 84.18 mL/min (ref >60.00)
Glucose, Bld: 117 mg/dL — ABNORMAL HIGH (ref 70–99)
Potassium: 3.9 mEq/L (ref 3.5–5.1)
Sodium: 136 mEq/L (ref 135–145)
Total Protein: 7 g/dL (ref 6.0–8.3)

## 2016-07-09 LAB — CBC
HCT: 40.9 % (ref 36.0–46.0)
HEMOGLOBIN: 14 g/dL (ref 12.0–15.0)
MCHC: 34.2 g/dL (ref 30.0–36.0)
MCV: 90 fl (ref 78.0–100.0)
PLATELETS: 204 10*3/uL (ref 150.0–400.0)
RBC: 4.55 Mil/uL (ref 3.87–5.11)
RDW: 13.6 % (ref 11.5–15.5)
WBC: 10.1 10*3/uL (ref 4.0–10.5)

## 2016-07-09 LAB — LIPID PANEL
CHOL/HDL RATIO: 7
CHOLESTEROL: 270 mg/dL — AB (ref 0–200)
HDL: 36.9 mg/dL — ABNORMAL LOW (ref >39.00)
NonHDL: 232.66
TRIGLYCERIDES: 219 mg/dL — AB (ref 0.0–149.0)
VLDL: 43.8 mg/dL — ABNORMAL HIGH (ref 0.0–40.0)

## 2016-07-09 LAB — LDL CHOLESTEROL, DIRECT: Direct LDL: 186 mg/dL

## 2016-07-09 NOTE — Progress Notes (Signed)
The patient is a 50 year old female who returns to pain management for further evaluation and treatment of pain involving the neck upper extremity region lower back lower extremity region. Patient is status post surgery of the cervical region and is with pain of the lower back lower extremity region with continued pain of the cervical region. We will continue present medications consisting of baclofen and hydrocodone acetaminophen and we will consider additional modifications of treatment pending follow-up evaluations. Patient denies recent trauma change in events of daily living the call significant change in symptomatology.      Physical examination  Palpation over the cervical region cervical facet region was attends to palpation of moderate degree with moderate tenderness of the splenius capitis and occipitalis regions. Palpation of the acromioclavicular and glenohumeral joint regions reproduce mild discomfort. The patient was at unremarkable Spurling's maneuver with significant muscle spasms in the cervicothoracic region. The patient appeared to be with slightly decreased grip strength with Tinel and Phalen's maneuver without increased pain of significant degree. There was tenderness over the thoracic facet with moderate muscle spasms of the thoracic paraspinal musculature region without crepitus of the thoracic region with palpation of the lumbar paraspinal musculatures and lumbar facet region associated with moderate tends to palpation there was increased pain with pressure applied to the ileum with patient in lateral decubitus position. Palpation over the lumbar facet region was of increased pain with lateral bending rotation extension and palpation over the lumbar facet region. There was tenderness of the PSIS and PII S region a moderate degree. Palpation of the gluteal and piriformis musculature region reproduces moderate discomfort. No definite sensory deficit or dermatomal distribution  detected. There was negative clonus negative Homans Abdomen nontender with no costovertebral tenderness      Assessment    Degenerative changes lumbar spine L1-2 level degenerative disc disease with right central disc extrusion cranial migration of disc material   L1-L2: Small right paracentral disc protrusion. No spinal stenosis or nerve root encroachment.  L2-L3: Mild disc bulging. No spinal stenosis or nerve root encroachment.  L3-L4: Disc height and hydration are maintained. There is mild facet and ligamentous hypertrophy. No spinal stenosis or nerve root encroachment.  L4-L5: Mild disc bulging with mild facet and ligamentous hypertrophy. No spinal stenosis or nerve root encroachment.  L5-S1: Disc height and hydration are maintained. No spinal stenosis or nerve root encroachment.  IMPRESSION:  1. Small right paracentral disc protrusion at L1-L2. 2. Mild disc bulging and facet disease from L2-L3 through L4-L5. 3. No significant spinal stenosis or nerve root encroachment. 4. 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 C6-C7 broad-based disc herniation narrows the spinal canal but does not compress the cord shows foraminal extension. C5-C6 and C6-C7 abnormalities noted with C4-C5 changes apparently have worsened since prior study  Status post cervical surgery  Cervical stenosis  Cervical radiculopathy  Cervical facet syndrome      PLAN   Continue present medications  hydrocodone acetaminophen and baclofen. Do not take Zanaflex (TIZANIDINE)   F/U PCP Dr. Carlis Abbott for evaluation of  BP and  general medical condition  F/U with neurosurgeon as discussed Patient will follow-up with Dr. Pamala Hurry status post surgery of cervical  region   F/U neurological evaluation. May consider PNCV/EMG studies and other studies pending follow-up evaluation  May consider radiofrequency rhizolysis or intraspinal procedures pending response to present treatment and F/U evaluation  Patient to call pain management for any change in condition or concerns prior to scheduled appointment

## 2016-07-11 ENCOUNTER — Encounter: Payer: Self-pay | Admitting: Primary Care

## 2016-07-11 ENCOUNTER — Ambulatory Visit (INDEPENDENT_AMBULATORY_CARE_PROVIDER_SITE_OTHER): Payer: PPO | Admitting: Primary Care

## 2016-07-11 VITALS — BP 114/74 | HR 74 | Temp 98.9°F | Ht 61.0 in | Wt 144.8 lb

## 2016-07-11 DIAGNOSIS — F419 Anxiety disorder, unspecified: Secondary | ICD-10-CM

## 2016-07-11 DIAGNOSIS — R6889 Other general symptoms and signs: Secondary | ICD-10-CM

## 2016-07-11 DIAGNOSIS — Z0001 Encounter for general adult medical examination with abnormal findings: Secondary | ICD-10-CM

## 2016-07-11 DIAGNOSIS — E785 Hyperlipidemia, unspecified: Secondary | ICD-10-CM

## 2016-07-11 DIAGNOSIS — Z23 Encounter for immunization: Secondary | ICD-10-CM

## 2016-07-11 DIAGNOSIS — H9191 Unspecified hearing loss, right ear: Secondary | ICD-10-CM

## 2016-07-11 DIAGNOSIS — F32A Depression, unspecified: Secondary | ICD-10-CM

## 2016-07-11 DIAGNOSIS — Z79899 Other long term (current) drug therapy: Secondary | ICD-10-CM | POA: Insufficient documentation

## 2016-07-11 DIAGNOSIS — F329 Major depressive disorder, single episode, unspecified: Secondary | ICD-10-CM

## 2016-07-11 DIAGNOSIS — F418 Other specified anxiety disorders: Secondary | ICD-10-CM | POA: Diagnosis not present

## 2016-07-11 MED ORDER — BUSPIRONE HCL 7.5 MG PO TABS: 7.5000 mg | ORAL_TABLET | Freq: Two times a day (BID) | ORAL | 1 refills | Status: DC

## 2016-07-11 NOTE — Progress Notes (Signed)
Pre visit review using our clinic review tool, if applicable. No additional management support is needed unless otherwise documented below in the visit note. 

## 2016-07-11 NOTE — Assessment & Plan Note (Signed)
Increased symptoms of depression and anxiety over the past 6+ months. Does feel overall stable on Prozac. GAD 7 score 14 today. Denies SI/HI. Will trial BuSpar 7.5 mg twice a day for increased anxiety and depression. Follow-up in 6 weeks for reevaluation.

## 2016-07-11 NOTE — Progress Notes (Signed)
Subjective:    Patient ID: Karen Edwards, female    DOB: [birth year], 50 y.o.   MRN: [id number]  HPI  Karen Edwards is a 49 year old female who presents today for complete physical.  Immunizations: -Tetanus: Unsure, believes it's been over 10 years. -Influenza: Declines  Diet: Karen Edwards endorses a healthy diet. Breakfast: Skips Lunch: Skips Dinner: Meat, vegetables, fast food Snacks: Occasionally chips Desserts: None Beverages: Coffee, water (3 bottles daily)  Exercise: Karen Edwards does not currently exercise.  Eye exam: Completed 1 week ago, slight changes in vision. Dental exam: Completes semi-annually Colonoscopy: Evaluated by [facility], completed in 2016 Pap Smear: Completed in May 2016 per GYN Mammogram:. Completed in May 2016 per GYN  Review of Systems  Constitutional: Negative for fever and unexpected weight change.  HENT: Positive for sneezing. Negative for rhinorrhea.   Respiratory: Positive for cough. Negative for shortness of breath.   Cardiovascular: Negative for chest pain.  Gastrointestinal: Negative for constipation and diarrhea.  Genitourinary: Negative for difficulty urinating and menstrual problem.  Musculoskeletal: Positive for arthralgias, back pain and neck pain. Negative for myalgias.       Managed per pain management.   Skin: Negative for rash.  Allergic/Immunologic: Negative for environmental allergies.  Neurological: Positive for numbness and headaches. Negative for dizziness.  Psychiatric/Behavioral:       Decreased interest to do things Karen Edwards once liked doing, doesn't tend to her home as Karen Edwards once did, increased anxiety.        Past Medical History:  Diagnosis Date  . Chronic back pain   . Depression   . Hypotension   . IBS (irritable bowel syndrome) 05/2015  . MVP (mitral valve prolapse)      Social History   Social History  . Marital status: Married    Spouse name: N/A  . Number of children: N/A  . Years of education: N/A   Occupational History    . Not on file.   Social History Main Topics  . Smoking status: Current Every Day Smoker    Packs/day: 0.50    Types: Cigarettes  . Smokeless tobacco: Not on file  . Alcohol use 0.6 oz/week    1 Cans of beer per week     Comment: drinks an occasional beer  . Drug use: No  . Sexual activity: Yes   Other Topics Concern  . Not on file   Social History Narrative   Married.   2 children. One boy and one girl.   On disability.   Playing with her dogs, gardening.       Past Surgical History:  Procedure Laterality Date  . ANKLE SURGERY Right 2015  . CERVICAL SPINE SURGERY    . CESAREAN SECTION     x 2  . DILATION AND CURETTAGE OF UTERUS    . HEMORRHOID SURGERY N/A 06/16/2015   Procedure: HEMORRHOIDECTOMY;  Surgeon: Leonie Green, MD;  Location: [facility] ORS;  Service: General;  Laterality: N/A;  . HYSTEROSCOPY    . OTHER SURGICAL HISTORY Right 2015   ankle    Family History  Problem Relation Age of Onset  . Arthritis Mother   . Depression Mother   . Hyperlipidemia Mother   . Hypertension Mother   . Kidney disease Mother   . Mental illness Mother   . Vision loss Mother   . Varicose Veins Mother   . Hearing loss Father   . Hyperlipidemia Father   . Hypertension Father     Allergies  Allergen Reactions  . No Known Allergies     Current Outpatient Prescriptions on File Prior to Visit  Medication Sig Dispense Refill  . baclofen (LIORESAL) 10 MG tablet Limit  2 - 4 tablets by mouth per day if tolerated.   NO ZANAFLEX (TIZANIDINE) 110 each 0  . FLUoxetine (PROZAC) 20 MG capsule TAKE THREE CAPSULES BY MOUTH ONCE DAILY 270 capsule 1  . HYDROcodone-acetaminophen (NORCO/VICODIN) 5-325 MG tablet Limit 1 tab by mouth per day or twice per day if tolerated 60 tablet 0  . medroxyPROGESTERone (PROVERA) 10 MG tablet Take by mouth.    . traZODone (DESYREL) 100 MG tablet Take 2 tablets (200 mg total) by mouth at bedtime. 60 tablet 5  . traZODone (DESYREL) 50 MG tablet Take 1  tablet (50 mg total) by mouth at bedtime. 30 tablet 3  . vitamin B-12 (CYANOCOBALAMIN) 50 MCG tablet Take 50 mcg by mouth every morning.      Current Facility-Administered Medications on File Prior to Visit  Medication Dose Route Frequency Provider Last Rate Last Dose  . lactated ringers infusion 1,000 mL  1,000 mL Intravenous Continuous Mohammed Kindle, MD 125 mL/hr at 03/20/16 1221 1,000 mL at 03/20/16 1221  . lactated ringers infusion 1,000 mL  1,000 mL Intravenous Continuous Mohammed Kindle, MD 125 mL/hr at 06/05/16 1149 1,000 mL at 06/05/16 1149    BP 114/74   Pulse 74   Temp 98.9 F (37.2 C) (Oral)   Ht 5\' 1"  (1.549 m)   Wt 144 lb 12.8 oz (65.7 kg)   SpO2 97%   BMI 27.36 kg/m    Objective:   Physical Exam  Constitutional: Karen Edwards is oriented to person, place, and time. Karen Edwards appears well-nourished.  HENT:  Right Ear: Tympanic membrane and ear canal normal.  Left Ear: Tympanic membrane and ear canal normal.  Nose: Nose normal.  Mouth/Throat: Oropharynx is clear and moist.  Decreased hearing to right ear. Right canal with cerumen impaction, TM unremarkable post irrigation.  Eyes: Conjunctivae and EOM are normal. Pupils are equal, round, and reactive to light.  Neck: Neck supple. No thyromegaly present.  Cardiovascular: Normal rate and regular rhythm.   No murmur heard. Pulmonary/Chest: Effort normal and breath sounds normal. Karen Edwards has no rales.  Abdominal: Soft. Bowel sounds are normal. There is no tenderness.  Musculoskeletal:       Lumbar back: Karen Edwards exhibits decreased range of motion and pain.  Lymphadenopathy:    Karen Edwards has no cervical adenopathy.  Neurological: Karen Edwards is alert and oriented to person, place, and time. Karen Edwards has normal reflexes. No cranial nerve deficit.  Skin: Skin is warm and dry. No rash noted.  Psychiatric: Karen Edwards has a normal mood and affect.          Assessment & Plan:

## 2016-07-11 NOTE — Patient Instructions (Addendum)
You will be contacted regarding your referral to [facility] for hearing testing and evaluation.  Please let us know if you have not heard back within one week.   Start Buspar 7.5 mg tablets for anxiety and depression. Take 1 tablet by mouth twice daily. Continue Prozac 20 mg tablets.  Your cholesterol is too high, you must work to decrease fast food, junk food, fried foods. Increase vegetables, fruit, whole grains.   Start exercising. You should be getting 150 minutes of moderate intensity exercise weekly.  Ensure you are consuming 64 ounces of water daily.  Schedule a lab only appointment in 4 months for re-check of your cholesterol.   Follow up in 6 weeks for re-evaluation.  It was a pleasure to see you today!   High Cholesterol High cholesterol refers to having a high level of cholesterol in your blood. Cholesterol is a white, waxy, fat-like protein that your body needs in small amounts. Your liver makes all the cholesterol you need. Excess cholesterol comes from the food you eat. Cholesterol travels in your bloodstream through your blood vessels. If you have high cholesterol, deposits (plaque) may build up on the walls of your blood vessels. This makes the arteries narrower and stiffer. Plaque increases your risk of heart attack and stroke. Work with your health care provider to keep your cholesterol levels in a healthy range. RISK FACTORS Several things can make you more likely to have high cholesterol. These include:   Eating foods high in animal fat (saturated fat) or cholesterol.  Being overweight.  Not getting enough exercise.  Having a family history of high cholesterol. SIGNS AND SYMPTOMS High cholesterol does not cause symptoms. DIAGNOSIS  Your health care provider can do a blood test to check whether you have high cholesterol. If you are older than 20, your health care provider may check your cholesterol every 4-6 years. You may be checked more often if you already have  high cholesterol or other risk factors for heart disease. The blood test for cholesterol measures the following:  Bad cholesterol (LDL cholesterol). This is the type of cholesterol that causes heart disease. This number should be less than 100.  Good cholesterol (HDL cholesterol). This type helps protect against heart disease. A healthy level of HDL cholesterol is 60 or higher.  Total cholesterol. This is the combined number of LDL cholesterol and HDL cholesterol. A healthy number is less than 200. TREATMENT  High cholesterol can be treated with diet changes, lifestyle changes, and medicine.   Diet changes may include eating more whole grains, fruits, vegetables, nuts, and fish. You may also have to cut back on red meat and foods with a lot of added sugar.  Lifestyle changes may include getting at least 40 minutes of aerobic exercise three times a week. Aerobic exercises include walking, biking, and swimming. Aerobic exercise along with a healthy diet can help you maintain a healthy weight. Lifestyle changes may also include quitting smoking.  If diet and lifestyle changes are not enough to lower your cholesterol, your health care provider may prescribe a statin medicine. This medicine has been shown to lower cholesterol and also lower the risk of heart disease. HOME CARE INSTRUCTIONS  Only take over-the-counter or prescription medicines as directed by your health care provider.   Follow a healthy diet as directed by your health care provider. For instance:   Eat chicken (without skin), fish, veal, shellfish, ground [place] breast, and round or loin cuts of red meat.  Do not eat  fried foods and fatty meats, such as hot dogs and salami.   Eat plenty of fruits, such as apples.   Eat plenty of vegetables, such as broccoli, potatoes, and carrots.   Eat beans, peas, and lentils.   Eat grains, such as barley, rice, couscous, and bulgur wheat.   Eat pasta without cream sauces.    Use skim or nonfat milk and low-fat or nonfat yogurt and cheeses. Do not eat or drink whole milk, cream, ice cream, egg yolks, and hard cheeses.   Do not eat stick margarine or tub margarines that contain trans fats (also called partially hydrogenated oils).   Do not eat cakes, cookies, crackers, or other baked goods that contain trans fats.   Do not eat saturated tropical oils, such as coconut and palm oil.   Exercise as directed by your health care provider. Increase your activity level with activities such as gardening or walking.   Keep all follow-up appointments.  SEEK MEDICAL CARE IF:  You are struggling to maintain a healthy diet or weight.  You need help starting an exercise program.  You need help to stop smoking. SEEK IMMEDIATE MEDICAL CARE IF:  You have chest pain.  You have trouble breathing.   This information is not intended to replace advice given to you by your health care provider. Make sure you discuss any questions you have with your health care provider.   Document Released: 10/14/2005 Document Revised: 11/04/2014 Document Reviewed: 08/06/2013 Elsevier Interactive Patient Education [employer].

## 2016-07-11 NOTE — Assessment & Plan Note (Signed)
TC, Trigs, LDL above goal.  She would like to work on improvements in diet and exercise. Start Fish Oil 100 mg daily with meals. Recheck lipids in 4 months, if above goal then will initiate treatment.

## 2016-07-11 NOTE — Assessment & Plan Note (Addendum)
Tetanus due, provided today. Declines influenza. Pap and mammogram UTD. Colonoscopy UTD. Poor diet and does not routinely exercise. Discussed the importance of a healthy diet and regular exercise in order for weight loss and to reduce risk of other medical diseases. Exam with chronic back pain and increased anxiety and depression. Labs with hyperlipidemia, discussed elsewhere. Follow up in 1 year for repeat physical or sooner if needed.

## 2016-07-22 ENCOUNTER — Other Ambulatory Visit: Payer: Self-pay | Admitting: Pain Medicine

## 2016-07-23 ENCOUNTER — Encounter: Payer: Self-pay | Admitting: Primary Care

## 2016-07-23 ENCOUNTER — Other Ambulatory Visit: Payer: PPO

## 2016-07-23 DIAGNOSIS — M533 Sacrococcygeal disorders, not elsewhere classified: Secondary | ICD-10-CM | POA: Diagnosis not present

## 2016-07-23 DIAGNOSIS — M791 Myalgia: Secondary | ICD-10-CM | POA: Diagnosis not present

## 2016-07-23 DIAGNOSIS — M47817 Spondylosis without myelopathy or radiculopathy, lumbosacral region: Secondary | ICD-10-CM | POA: Diagnosis not present

## 2016-07-23 DIAGNOSIS — M5416 Radiculopathy, lumbar region: Secondary | ICD-10-CM | POA: Diagnosis not present

## 2016-07-24 ENCOUNTER — Other Ambulatory Visit: Payer: Self-pay | Admitting: Primary Care

## 2016-07-24 DIAGNOSIS — E538 Deficiency of other specified B group vitamins: Secondary | ICD-10-CM

## 2016-07-25 ENCOUNTER — Other Ambulatory Visit: Payer: Self-pay | Admitting: Pain Medicine

## 2016-07-26 DIAGNOSIS — M5416 Radiculopathy, lumbar region: Secondary | ICD-10-CM | POA: Diagnosis not present

## 2016-07-26 DIAGNOSIS — M5417 Radiculopathy, lumbosacral region: Secondary | ICD-10-CM | POA: Diagnosis not present

## 2016-07-26 DIAGNOSIS — M79662 Pain in left lower leg: Secondary | ICD-10-CM | POA: Diagnosis not present

## 2016-07-26 DIAGNOSIS — M79661 Pain in right lower leg: Secondary | ICD-10-CM | POA: Diagnosis not present

## 2016-07-26 DIAGNOSIS — M5137 Other intervertebral disc degeneration, lumbosacral region: Secondary | ICD-10-CM | POA: Diagnosis not present

## 2016-08-05 ENCOUNTER — Other Ambulatory Visit: Payer: Self-pay | Admitting: *Deleted

## 2016-08-05 ENCOUNTER — Other Ambulatory Visit: Payer: Self-pay | Admitting: Primary Care

## 2016-08-05 DIAGNOSIS — G4700 Insomnia, unspecified: Secondary | ICD-10-CM

## 2016-08-06 ENCOUNTER — Encounter: Payer: Self-pay | Admitting: Primary Care

## 2016-08-06 NOTE — Telephone Encounter (Signed)
Ok to refill? Electronically refill request for   traZODone (DESYREL) 100 MG tablet  Last prescribed on 02/27/2016. Last seen on 07/11/2016.

## 2016-08-13 ENCOUNTER — Other Ambulatory Visit (INDEPENDENT_AMBULATORY_CARE_PROVIDER_SITE_OTHER): Payer: PPO

## 2016-08-13 DIAGNOSIS — E538 Deficiency of other specified B group vitamins: Secondary | ICD-10-CM

## 2016-08-13 LAB — VITAMIN B12: Vitamin B-12: >1500 pg/mL — ABNORMAL HIGH (ref 211–911)

## 2016-08-25 ENCOUNTER — Other Ambulatory Visit: Payer: Self-pay | Admitting: Pain Medicine

## 2016-08-26 DIAGNOSIS — M533 Sacrococcygeal disorders, not elsewhere classified: Secondary | ICD-10-CM | POA: Diagnosis not present

## 2016-08-26 DIAGNOSIS — M791 Myalgia: Secondary | ICD-10-CM | POA: Diagnosis not present

## 2016-08-26 DIAGNOSIS — M47817 Spondylosis without myelopathy or radiculopathy, lumbosacral region: Secondary | ICD-10-CM | POA: Diagnosis not present

## 2016-08-26 DIAGNOSIS — M5416 Radiculopathy, lumbar region: Secondary | ICD-10-CM | POA: Diagnosis not present

## 2016-08-28 ENCOUNTER — Encounter: Payer: Self-pay | Admitting: Primary Care

## 2016-08-28 ENCOUNTER — Ambulatory Visit: Payer: PPO | Admitting: Primary Care

## 2016-08-30 ENCOUNTER — Telehealth: Payer: Self-pay | Admitting: Primary Care

## 2016-08-30 ENCOUNTER — Ambulatory Visit: Payer: PPO | Admitting: Primary Care

## 2016-08-30 ENCOUNTER — Encounter: Payer: Self-pay | Admitting: Primary Care

## 2016-08-30 NOTE — Telephone Encounter (Signed)
Called and left a message with Dr. Ethel Rana office regarding patient's request for Neurontin.

## 2016-09-10 DIAGNOSIS — M545 Low back pain: Secondary | ICD-10-CM | POA: Diagnosis not present

## 2016-09-11 DIAGNOSIS — M545 Low back pain: Secondary | ICD-10-CM | POA: Diagnosis not present

## 2016-09-12 ENCOUNTER — Ambulatory Visit: Payer: PPO | Admitting: Primary Care

## 2016-09-12 ENCOUNTER — Telehealth: Payer: Self-pay | Admitting: Primary Care

## 2016-09-12 NOTE — Telephone Encounter (Signed)
Pt called at 0932 to cancel appt due to feeling better. I could not get in touch with cma at time of call.   Do you want me to take this appointment off the schedule?

## 2016-09-12 NOTE — Telephone Encounter (Signed)
Yes, please cancel and take off schedule. Thanks.

## 2016-09-13 DIAGNOSIS — M533 Sacrococcygeal disorders, not elsewhere classified: Secondary | ICD-10-CM | POA: Diagnosis not present

## 2016-09-13 DIAGNOSIS — M5416 Radiculopathy, lumbar region: Secondary | ICD-10-CM | POA: Diagnosis not present

## 2016-09-13 DIAGNOSIS — M791 Myalgia: Secondary | ICD-10-CM | POA: Diagnosis not present

## 2016-09-13 DIAGNOSIS — M47817 Spondylosis without myelopathy or radiculopathy, lumbosacral region: Secondary | ICD-10-CM | POA: Diagnosis not present

## 2016-09-17 DIAGNOSIS — M545 Low back pain: Secondary | ICD-10-CM | POA: Diagnosis not present

## 2016-09-20 ENCOUNTER — Encounter: Payer: Self-pay | Admitting: Primary Care

## 2016-09-24 DIAGNOSIS — M545 Low back pain: Secondary | ICD-10-CM | POA: Diagnosis not present

## 2016-09-26 ENCOUNTER — Encounter: Payer: Self-pay | Admitting: Pain Medicine

## 2016-10-02 ENCOUNTER — Other Ambulatory Visit: Payer: Self-pay | Admitting: Primary Care

## 2016-10-02 DIAGNOSIS — G4700 Insomnia, unspecified: Secondary | ICD-10-CM

## 2016-10-02 MED ORDER — TRAZODONE HCL 100 MG PO TABS: 300.0000 mg | ORAL_TABLET | Freq: Every day | ORAL | 1 refills | Status: DC

## 2016-10-02 NOTE — Addendum Note (Signed)
Addended by: Jacqualin Combes on: 10/02/2016 12:54 PM   Modules accepted: Orders

## 2016-10-02 NOTE — Telephone Encounter (Signed)
Patient is in need of refill   Trazodone 100 mg tablet  Taking 300 mg tablets by mouth daily at bedtime.  Last prescribed on 08/07/2016 but that was for 200 mg tablets.

## 2016-10-11 ENCOUNTER — Encounter: Payer: Self-pay | Admitting: Primary Care

## 2016-10-14 ENCOUNTER — Ambulatory Visit: Payer: PPO | Attending: Primary Care | Admitting: Audiology

## 2016-10-14 DIAGNOSIS — H9191 Unspecified hearing loss, right ear: Secondary | ICD-10-CM | POA: Insufficient documentation

## 2016-10-14 DIAGNOSIS — R94120 Abnormal auditory function study: Secondary | ICD-10-CM | POA: Diagnosis not present

## 2016-10-14 DIAGNOSIS — H9041 Sensorineural hearing loss, unilateral, right ear, with unrestricted hearing on the contralateral side: Secondary | ICD-10-CM | POA: Diagnosis not present

## 2016-10-14 DIAGNOSIS — R2689 Other abnormalities of gait and mobility: Secondary | ICD-10-CM | POA: Insufficient documentation

## 2016-10-14 DIAGNOSIS — H93299 Other abnormal auditory perceptions, unspecified ear: Secondary | ICD-10-CM | POA: Insufficient documentation

## 2016-10-14 NOTE — Patient Instructions (Addendum)
  [employer] in [place] may help with obtaining one hearing aid or one captioned telephone if hearing loss and financial qualifications are met.  Please contact Robin Searing at [phone] .  RECOMMENDATIONS: 1.   Monitor hearing with a repeat audiological evaluation in 6-12 months (earlier if there is any change in hearing or ear pressure).  2.   Consider further evaluation of the right ear by an Ear, Nose and Throat physician because of the right ear pain, hearing loss and balance issues.  3.   Strategies that help improve hearing include: A) Face the speaker directly. Optimal is having the speakers face well - lit.  Unless amplified, being within 3-6 feet of the speaker will enhance word recognition. B) Avoid having the speaker back-lit as this will minimize the ability to use cues from lip-reading, facial expression and gestures. C)  Word recognition is poorer in background noise. For optimal word recognition, turn off the TV, radio or noisy fan when engaging in conversation. In a restaurant, try to sit away from noise sources and close to the primary speaker.  D)  Ask for topic clarification from time to time in order to remain in the conversation.  Most people don't mind repeating or clarifying a point when asked.  If needed, explain the difficulty hearing in background noise or hearing loss. 4.   Use hearing protection during noisy activities such as using a weed eater, moving the lawn,  etc. Sponge plugs (available at pharmacies) or earmuffs (available at sporting goods stores or department stores such as [employer]) are useful for noisy activities and venues. 5.   Consider a hearing aid evaluation or CROS hearing aid.   Amplification helps make the signal louder and therefore often improves hearing and word recognition.  Amplification has many forms including hearing aids in one or both ears, an assistive listening device which have a microphone and speaker such  as a small handheld device and/or even a surround sound system of speakers.  Amplification may be covered by some insurances, but not all.  It is important to note that hearing aids must be individually fit according to the hearing test results and the ear shape.  Audiologists and hearing aid dealers in [place] must be licensed in order to dispense hearing aids.  In addition, a trial period is mandated by law in our state because often amplification must be tried and then evaluated in order to determine benefit.      There are many excellent choices when it comes to amplification in our area and providers are listed in the phone book under hearing aids, there are audiologists in private practice, those affiliated with Ear, Nose and Throat physicians, and there are audiologists located at [employer].   Deborah L. Heide Spark, Au.D., CCC-A Doctor of Audiology

## 2016-10-14 NOTE — Procedures (Signed)
[facility] [place]  [place], [place] [address]  [phone]   Audiological Evaluation  Patient Name: Karen Edwards   Status: Outpatient   DOB: 23-May-1966    Diagnosis: Right ear hearing loss MRN: [id number] Date:  10/14/2016     Referent: Sheral Flow, NP  History: Karen Edwards was seen for an audiological evaluation. States is currently on "disability" and wondering whether hearing loss should be added to her "disability". Accompanied by: Husband Primary Concern: Concerned about how much hearing loss on right side.  Have "stabbing pain on the right side, when push on tragus" - tries not to touch the ear because of this. History of hearing problems: Y "Can't hear out of right ear"  Happened suddenly in "my 30's".  History of ear infections:  N History of ear surgery or "tubes" : N History of dizziness/vertigo:   Y / N History of balance issues:  Y "Loose balance a lot".  Started about the same time of the hearing loss. Was evaluated in [place] was told "was probably due to the hearing loss". Tinnitus: Sometimes -sounds like "bells".  Happens in both ears.  Sound sensitivity: Left ear is sensitive to loud sounds. History of occupational noise exposure: N History of hypertension: N History of diabetes:  N Family history of hearing loss:  Dad age 63. Other concerns: "Headaches all of the time" - all over. Stress, bright lights makes it worse.  A physician told her that "headache are coming from back and neck". Medications:    Evaluation: Conventional pure tone audiometry from 250Hz  - 8000Hz  with using insert earphones.  Hearing Thresholds: Right ear:  Masked hearing thresholds of 75-80 dBHL from 250Hz  - 500HZ ; 100dBHL - 110dBHL from 750Hz  - 4000Hz  and no response at 8000Hz .  Left ear:    Thresholds of 10-20 dBHL. Reliability is good Speech reception levels (repeating words near threshold) using recorded spondee word  lists:  Right ear: 65 dBHL speech detection levels with contralateral masking.  Left ear:  20 dBHL Word recognition (at comfortably loud volumes) using recorded word lists at 55 dBHL, in quiet.  Right ear: N/A    Left ear:   94% Word recognition in minimal background noise:  +5 dBHL  Right ear: N/A                 Left ear:  48%  Tympanometry (middle ear function). Ipsilateral acoustic reflexes were not completed because she jumped, indicating to was too loud.  Right ear: Normal (Type A).  Left ear: Normal (Type A). Distortion Product Otoacoustic Emissions (DPOAE's), a test of inner ear function was completed from 2000Hz  - 10,000Hz  bilaterally:  Right ear: Absent responses throughout the range supporting abnormal outer hair cell function in the cochlea.  Left ear: Present responses low frequencies supporting good outer hair cell function in the cochlea with absent high frequency responses.   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  Please note that [facility] reported some bone conduction responses in the low frequencies that were vibro-tactile.  There may also be a masking dilemma so that right ear hearing thresholds may be poorer than indicated because of sound crossover to the left, normal hearing ear when loud levels are presented to the right ear.. Middle ear pressure is within normal limits bilaterally but acoustic reflexes were not completed because they were painful to her.  The  left ear has normal hearing thresholds with present low frequency inner ear function and absent high frequency inner ear function so that hearing must be monitored to rule out a progressive hearing loss on the left side.  Sound sensitivity associated with sensorineural hearing loss or recruitment is present bilaterally.   Word recognition is excellent in the left ear in quiet but drops to poor in minimal background noise at conversational speech levels.   The right ear "sounds staticy" even at loud enough levels. This amount of hearing loss would adversely affect speech communication at normal conversational speech levels especially in minimal background noise.Karen Edwards may benefit from amplification; therefore a hearing aid evaluation is recommended.  An ENT referral is needed because of the reported right ear pain and unsteadiness even though the hearing loss appears long standing.    Please note that [facility] requested that I calculate a percentage of disability on the right side - I informed her that I did not know the calculation that a disability determination would be based on (because there are various calculations) but that she had substantial hearing loss on the right side that is apparently long-standing by her history.    RECOMMENDATIONS: 1.   Monitor hearing with a repeat audiological evaluation in 6-12 months (earlier if there is any change in hearing or ear pressure).  2.   Consider further evaluation of the right ear by an Ear, Nose and Throat physician because of the right ear pain, hearing loss and balance issues.  3.   Strategies that help improve hearing include: A) Face the speaker directly. Optimal is having the speakers face well - lit.  Unless amplified, being within 3-6 feet of the speaker will enhance word recognition. B) Avoid having the speaker back-lit as this will minimize the ability to use cues from lip-reading, facial expression and gestures. C)  Word recognition is poorer in background noise. For optimal word recognition, turn off the TV, radio or noisy fan when engaging in conversation. In a restaurant, try to sit away from noise sources and close to the primary speaker.  D)  Ask for topic clarification from time to time in order to remain in the conversation.  Most people don't mind repeating or clarifying a point when asked.  If needed, explain the difficulty hearing in background noise or hearing loss. 4.   Use hearing  protection during noisy activities such as using a weed eater, moving the lawn,  etc. Sponge plugs (available at pharmacies) or earmuffs (available at sporting goods stores or department stores such as [employer]) are useful for noisy activities and venues. 5.   Consider a hearing aid evaluation or CROS hearing aid.   Amplification helps make the signal louder and therefore often improves hearing and word recognition.  Amplification has many forms including hearing aids in one or both ears, an assistive listening device which have a microphone and speaker such as a small handheld device and/or even a surround sound system of speakers.  Amplification may be covered by some insurances, but not all.  It is important to note that hearing aids must be individually fit according to the hearing test results and the ear shape.  Audiologists and hearing aid dealers in [place] must be licensed in order to dispense hearing aids.  In addition, a trial period is mandated by law in our state because often amplification must be tried and then evaluated in order to determine benefit.      There are many excellent  choices when it comes to amplification in our area and providers are listed in the phone book under hearing aids, there are audiologists in private practice, those affiliated with Ear, Nose and Throat physicians, and there are audiologists located at [employer].  Dreux Mcgroarty L. Heide Spark, Au.D., CCC-A Doctor of Audiology 10/14/2016   cc: Sheral Flow, NP

## 2016-10-15 DIAGNOSIS — M533 Sacrococcygeal disorders, not elsewhere classified: Secondary | ICD-10-CM | POA: Diagnosis not present

## 2016-10-15 DIAGNOSIS — M47817 Spondylosis without myelopathy or radiculopathy, lumbosacral region: Secondary | ICD-10-CM | POA: Diagnosis not present

## 2016-10-15 DIAGNOSIS — M5416 Radiculopathy, lumbar region: Secondary | ICD-10-CM | POA: Diagnosis not present

## 2016-10-15 DIAGNOSIS — M791 Myalgia: Secondary | ICD-10-CM | POA: Diagnosis not present

## 2016-10-17 ENCOUNTER — Ambulatory Visit: Payer: Self-pay | Admitting: Primary Care

## 2016-10-22 DIAGNOSIS — M791 Myalgia: Secondary | ICD-10-CM | POA: Diagnosis not present

## 2016-10-22 DIAGNOSIS — M5416 Radiculopathy, lumbar region: Secondary | ICD-10-CM | POA: Diagnosis not present

## 2016-10-22 DIAGNOSIS — M533 Sacrococcygeal disorders, not elsewhere classified: Secondary | ICD-10-CM | POA: Diagnosis not present

## 2016-10-22 DIAGNOSIS — M47817 Spondylosis without myelopathy or radiculopathy, lumbosacral region: Secondary | ICD-10-CM | POA: Diagnosis not present

## 2016-10-23 ENCOUNTER — Encounter: Payer: Self-pay | Admitting: Primary Care

## 2016-10-23 ENCOUNTER — Other Ambulatory Visit: Payer: Self-pay | Admitting: Primary Care

## 2016-10-29 ENCOUNTER — Encounter: Payer: Self-pay | Admitting: Primary Care

## 2016-11-01 ENCOUNTER — Telehealth: Payer: Self-pay | Admitting: Primary Care

## 2016-11-01 NOTE — Telephone Encounter (Signed)
I'm sorry to hear about that, but she needs an office visit for something like that. If it's an infection she would likely need antibiotics. She can go to Urgent Care of see if she can be seen at one of the other [place] offices. Do we have anything open?

## 2016-11-01 NOTE — Telephone Encounter (Signed)
Message left for patient to return my call.  

## 2016-11-01 NOTE — Telephone Encounter (Signed)
[place]  Patient Name: Karen Edwards  DOB: July 21, 1966    Initial Comment Caller says she has 2 teeth that are infected and she cant get in anywhere. is wanting to know if her dr. can call her in anything for the pain. Says that the left side of her face is in throbbing pain and she is also nauseated.    Nurse Assessment  Nurse: Wynetta Emery, RN, Baker Janus Date/Time Eilene Ghazi Time): 11/01/2016 10:25:39 AM  Confirm and document reason for call. If symptomatic, describe symptoms. ---[name] has two teeth that may be abscessed and are very painful. can feel it up in her ear. dentists are unavailable.  Does the patient have any new or worsening symptoms? ---Yes  Will a triage be completed? ---Yes  Related visit to physician within the last 2 weeks? ---No  Does the PT have any chronic conditions? (i.e. diabetes, asthma, etc.) ---No  Is the patient pregnant or possibly pregnant? (Ask all females between the ages of 58-55) ---No  Is this a behavioral health or substance abuse call? ---No     Guidelines    Guideline Title Affirmed Question Affirmed Notes  Toothache [1] SEVERE pain (e.g., excruciating, unable to do any normal activities) AND [2] not improved 2 hours after pain medicine    Final Disposition User   See Physician within 4 Hours (or PCP triage) Wynetta Emery, RN, Baker Janus    Comments  NOTE NO AVAILABLE APPTS WITH [facility] TODAY refuses to see anyone else. wants or is asking that something be called in to take care of pain -- antibiotic is what is needed then once infection under control needs to find dentist to remove tooth.   Referrals  REFERRED TO PCP OFFICE   Disagree/Comply: Comply

## 2016-11-06 NOTE — Telephone Encounter (Signed)
Message left for patient to return my call.  

## 2016-11-19 ENCOUNTER — Encounter: Payer: Self-pay | Admitting: Primary Care

## 2016-11-19 NOTE — Telephone Encounter (Signed)
Karen Edwards, see My Chart message from [facility] in regards to Mr. Karen Edwards. Can you assist?

## 2016-11-22 ENCOUNTER — Telehealth: Payer: Self-pay | Admitting: Primary Care

## 2016-11-22 DIAGNOSIS — M5030 Other cervical disc degeneration, unspecified cervical region: Secondary | ICD-10-CM

## 2016-11-22 DIAGNOSIS — M5136 Other intervertebral disc degeneration, lumbar region: Secondary | ICD-10-CM

## 2016-11-22 NOTE — Telephone Encounter (Signed)
Received notification from patient that she would like to switch providers for her spinal injections to Dr. Clydell Hakim. She is needing a referral. Referral placed.

## 2016-11-25 ENCOUNTER — Other Ambulatory Visit: Payer: Self-pay | Admitting: Pain Medicine

## 2016-11-25 DIAGNOSIS — M47817 Spondylosis without myelopathy or radiculopathy, lumbosacral region: Secondary | ICD-10-CM | POA: Diagnosis not present

## 2016-11-25 DIAGNOSIS — M791 Myalgia: Secondary | ICD-10-CM | POA: Diagnosis not present

## 2016-11-25 DIAGNOSIS — M5416 Radiculopathy, lumbar region: Secondary | ICD-10-CM | POA: Diagnosis not present

## 2016-11-25 DIAGNOSIS — M533 Sacrococcygeal disorders, not elsewhere classified: Secondary | ICD-10-CM | POA: Diagnosis not present

## 2016-12-03 ENCOUNTER — Other Ambulatory Visit: Payer: Self-pay | Admitting: Primary Care

## 2016-12-03 NOTE — Telephone Encounter (Signed)
Ok to refill? Electronically refill request for FLUoxetine (PROZAC) 20 MG capsule #270 with 1 refill. Last prescribed on 06/18/2016. Last seen on 07/11/2016.

## 2016-12-04 DIAGNOSIS — M5416 Radiculopathy, lumbar region: Secondary | ICD-10-CM | POA: Diagnosis not present

## 2016-12-04 DIAGNOSIS — M5430 Sciatica, unspecified side: Secondary | ICD-10-CM | POA: Diagnosis not present

## 2016-12-19 ENCOUNTER — Encounter: Payer: Self-pay | Admitting: Primary Care

## 2016-12-24 DIAGNOSIS — Z79891 Long term (current) use of opiate analgesic: Secondary | ICD-10-CM | POA: Diagnosis not present

## 2016-12-24 DIAGNOSIS — M47817 Spondylosis without myelopathy or radiculopathy, lumbosacral region: Secondary | ICD-10-CM | POA: Diagnosis not present

## 2016-12-24 DIAGNOSIS — M791 Myalgia: Secondary | ICD-10-CM | POA: Diagnosis not present

## 2016-12-24 DIAGNOSIS — M533 Sacrococcygeal disorders, not elsewhere classified: Secondary | ICD-10-CM | POA: Diagnosis not present

## 2016-12-24 DIAGNOSIS — G894 Chronic pain syndrome: Secondary | ICD-10-CM | POA: Diagnosis not present

## 2016-12-24 DIAGNOSIS — M5416 Radiculopathy, lumbar region: Secondary | ICD-10-CM | POA: Diagnosis not present

## 2016-12-26 ENCOUNTER — Encounter: Payer: Self-pay | Admitting: Primary Care

## 2016-12-26 ENCOUNTER — Telehealth: Payer: Self-pay | Admitting: Primary Care

## 2016-12-26 NOTE — Telephone Encounter (Signed)
Noted  

## 2016-12-26 NOTE — Telephone Encounter (Signed)
-----   Message from Fredonia Highland, AUD sent at 12/26/2016  8:02 AM EST ----- I apologize for the delay, I had emergency fmla.   She kept mentioning to me that we was "legally deaf" - I don't know where she got this. She made it clear that she was seeking disability for her hearing loss. Whereas I wanted to know whether she wanted to work - in that case vocational rehabiliation may be an avenue for hearing aids.    Deborah L. Heide Spark Au.D., CCC-A Doctor of Audiology  ----- Message ----- From: Pleas Koch, NP Sent: 11/05/2016   9:19 PM To: Fredonia Highland, AUD  Thank you for your evaluation. She was asking me several questions regarding the report including the question of her being "leagally deaf". Did you mention any of this to her?  Also how do you recommend she go about receiving hearing aids? Is there a place you recommend.  Thank you, Allie Bossier, NP-C  ----- Message ----- From: Fredonia Highland, AUD Sent: 11/04/2016   8:16 AM To: Pleas Koch, NP  Dear Alma Friendly,  I'm resending this fax because I received a phone call from [facility] last week. To clarify, audiology completes the evaluation with recommendations, but it is the PCP who makes the referrals.  They are listed on the bottom of the report.  Please let me know if you need other information.   Thank you. Kae Heller, AuD [phone] [email or web address]

## 2017-01-09 ENCOUNTER — Telehealth: Payer: Self-pay | Admitting: *Deleted

## 2017-01-09 NOTE — Telephone Encounter (Signed)
PT wrote in the following message via [place]. Please advise.  Appointment Request From: Shantay L. Tye Savoy    With Provider: Sheral Flow, NP [facility] [place]    Preferred Date Range: Any date 01/09/2017 or later    Preferred Times: Any    Reason: To address the following health maintenance concerns.  Colonoscopy    Comments:

## 2017-01-09 NOTE — Telephone Encounter (Signed)
Due for colonoscopy, will address during appointment tomorrow.

## 2017-01-10 ENCOUNTER — Ambulatory Visit (INDEPENDENT_AMBULATORY_CARE_PROVIDER_SITE_OTHER): Payer: PPO | Admitting: Primary Care

## 2017-01-10 ENCOUNTER — Encounter: Payer: Self-pay | Admitting: Primary Care

## 2017-01-10 VITALS — BP 120/78 | HR 72 | Temp 98.4°F | Ht 61.0 in | Wt 141.1 lb

## 2017-01-10 DIAGNOSIS — H6121 Impacted cerumen, right ear: Secondary | ICD-10-CM

## 2017-01-10 DIAGNOSIS — G4700 Insomnia, unspecified: Secondary | ICD-10-CM

## 2017-01-10 MED ORDER — ZOLPIDEM TARTRATE 5 MG PO TABS: 5.0000 mg | ORAL_TABLET | Freq: Every evening | ORAL | 0 refills | Status: DC | PRN

## 2017-01-10 NOTE — Progress Notes (Signed)
Pre visit review using our clinic review tool, if applicable. No additional management support is needed unless otherwise documented below in the visit note. 

## 2017-01-10 NOTE — Assessment & Plan Note (Signed)
Trazodone ineffective. Discussed to refrain from watching TV before bed, not to lay in bed if unable to sleep.  Will trial short term Ambien supply. Controlled substance contract obtained. She will update. Wean off Trazodone.

## 2017-01-10 NOTE — Progress Notes (Signed)
Subjective:    Patient ID: Karen Edwards, female    DOB: [birth year], 51 y.o.   MRN: [id number]  HPI  [name] is a 51 year old female who presents today with a chief complaint of ear fullness. Her fullness is located to the left ear for which she initially noticed about three days ago. She can hear bubbling sounds in her left ear upon palpation. She did stick a bobby pin into her ear yesterday without improvement. She denies fevers, chills, sore throat.   2) Insomnia: Difficulty falling asleep, doesn't wake during the night. She will lay there for hours before falling asleep. She will watch TV or is on her phone before bedtime. She is currently managed on Trazodone 300 mg which is ineffective She was managed on Ambien in the past with improvement and would like to try this again.  Review of Systems  Constitutional: Negative for chills and fever.  HENT: Negative for sinus pressure and sore throat.        Ear bubbles/fullness  Respiratory: Negative for cough.   Psychiatric/Behavioral: Positive for sleep disturbance. The patient is not nervous/anxious.        Past Medical History:  Diagnosis Date  . Chronic back pain   . Depression   . Hypotension   . IBS (irritable bowel syndrome) 05/2015  . MVP (mitral valve prolapse)      Social History   Social History  . Marital status: Married    Spouse name: N/A  . Number of children: N/A  . Years of education: N/A   Occupational History  . Not on file.   Social History Main Topics  . Smoking status: Current Every Day Smoker    Packs/day: 0.50    Types: Cigarettes  . Smokeless tobacco: Never Used  . Alcohol use 0.6 oz/week    1 Cans of beer per week     Comment: drinks an occasional beer  . Drug use: No  . Sexual activity: Yes   Other Topics Concern  . Not on file   Social History Narrative   Married.   2 children. One boy and one girl.   On disability.   Playing with her dogs, gardening.       Past Surgical  History:  Procedure Laterality Date  . ANKLE SURGERY Right 2015  . CERVICAL SPINE SURGERY    . CESAREAN SECTION     x 2  . DILATION AND CURETTAGE OF UTERUS    . HEMORRHOID SURGERY N/A 06/16/2015   Procedure: HEMORRHOIDECTOMY;  Surgeon: Leonie Green, MD;  Location: [facility] ORS;  Service: General;  Laterality: N/A;  . HYSTEROSCOPY    . OTHER SURGICAL HISTORY Right 2015   ankle    Family History  Problem Relation Age of Onset  . Arthritis Mother   . Depression Mother   . Hyperlipidemia Mother   . Hypertension Mother   . Kidney disease Mother   . Mental illness Mother   . Vision loss Mother   . Varicose Veins Mother   . Hearing loss Father   . Hyperlipidemia Father   . Hypertension Father     Allergies  Allergen Reactions  . No Known Allergies     Current Outpatient Prescriptions on File Prior to Visit  Medication Sig Dispense Refill  . baclofen (LIORESAL) 10 MG tablet Limit  2 - 4 tablets by mouth per day if tolerated.   NO ZANAFLEX (TIZANIDINE) 110 each 0  . FLUoxetine (PROZAC) 20 MG  capsule TAKE THREE CAPSULES BY MOUTH ONCE DAILY 270 capsule 1  . HYDROcodone-acetaminophen (NORCO/VICODIN) 5-325 MG tablet Limit 1 tab by mouth per day or twice per day if tolerated 60 tablet 0  . medroxyPROGESTERone (PROVERA) 10 MG tablet Take by mouth.    . busPIRone (BUSPAR) 7.5 MG tablet Take 1 tablet (7.5 mg total) by mouth 2 (two) times daily. (Patient not taking: Reported on 01/10/2017) 60 tablet 1   Current Facility-Administered Medications on File Prior to Visit  Medication Dose Route Frequency Provider Last Rate Last Dose  . lactated ringers infusion 1,000 mL  1,000 mL Intravenous Continuous Mohammed Kindle, MD 125 mL/hr at 03/20/16 1221 1,000 mL at 03/20/16 1221  . lactated ringers infusion 1,000 mL  1,000 mL Intravenous Continuous Mohammed Kindle, MD 125 mL/hr at 06/05/16 1149 1,000 mL at 06/05/16 1149    BP 120/78   Pulse 72   Temp 98.4 F (36.9 C) (Oral)   Ht 5\' 1"  (1.549 m)    Wt 141 lb 1.9 oz (64 kg)   SpO2 97%   BMI 26.66 kg/m    Objective:   Physical Exam  Constitutional: She appears well-nourished.  HENT:  Right Ear: Tympanic membrane and ear canal normal.  Left Ear: Tympanic membrane and ear canal normal.  Nose: Right sinus exhibits no maxillary sinus tenderness and no frontal sinus tenderness. Left sinus exhibits no maxillary sinus tenderness and no frontal sinus tenderness.  Mouth/Throat: Oropharynx is clear and moist.  Right canal with cerumen impaction, left TM cloudy with mild effusion. No s/s of acute infection.  Eyes: Conjunctivae are normal.  Neck: Neck supple.  Cardiovascular: Normal rate and regular rhythm.   Pulmonary/Chest: Effort normal and breath sounds normal. She has no wheezes. She has no rales.  Lymphadenopathy:    She has no cervical adenopathy.  Skin: Skin is warm and dry.  Psychiatric: She has a normal mood and affect.          Assessment & Plan:  Ear Fullness:  Located to left ear x 3 days. Exam today with mild effusion, cloudy. Right TM and canal unremarkable post irrigation. Suspect allergy involvement, so s/s of injection.  Trial Flonse and Claritin. She will update.  Sheral Flow, NP

## 2017-01-10 NOTE — Patient Instructions (Addendum)
Ear Bubbles/Fullness/Pressure: Try using Flonase (fluticasone) nasal spray. Instill 1 spray in each nostril twice daily.   You can also try an antihistamine such as Claritin daily to help dry up fluid behind your ear.  Wean off of the Trazodone. Take 2 tablets every night for 4 nights, then 1 tablet every night for 4 nights, then 1/2 tablet every night for 4 nights, then stop.  You may start Ambien 5 mg tablets. Be cautious as this may cause more drowsiness with the Trazodone as you wean off.   Please notify me if you do well on the Ambien. Do refrain from looking at a TV screen/phone/tablet before bedtime. Get up from bed if you cannot fall asleep.  It was a pleasure to see you today!

## 2017-01-13 ENCOUNTER — Ambulatory Visit: Payer: Self-pay | Admitting: Primary Care

## 2017-01-20 DIAGNOSIS — M5416 Radiculopathy, lumbar region: Secondary | ICD-10-CM | POA: Diagnosis not present

## 2017-01-20 DIAGNOSIS — M533 Sacrococcygeal disorders, not elsewhere classified: Secondary | ICD-10-CM | POA: Diagnosis not present

## 2017-01-20 DIAGNOSIS — Z79891 Long term (current) use of opiate analgesic: Secondary | ICD-10-CM | POA: Diagnosis not present

## 2017-01-20 DIAGNOSIS — M5136 Other intervertebral disc degeneration, lumbar region: Secondary | ICD-10-CM | POA: Diagnosis not present

## 2017-01-20 DIAGNOSIS — M47817 Spondylosis without myelopathy or radiculopathy, lumbosacral region: Secondary | ICD-10-CM | POA: Diagnosis not present

## 2017-01-20 DIAGNOSIS — M542 Cervicalgia: Secondary | ICD-10-CM | POA: Diagnosis not present

## 2017-01-20 DIAGNOSIS — M5033 Other cervical disc degeneration, cervicothoracic region: Secondary | ICD-10-CM | POA: Diagnosis not present

## 2017-01-20 DIAGNOSIS — M5137 Other intervertebral disc degeneration, lumbosacral region: Secondary | ICD-10-CM | POA: Diagnosis not present

## 2017-01-20 DIAGNOSIS — M5031 Other cervical disc degeneration,  high cervical region: Secondary | ICD-10-CM | POA: Diagnosis not present

## 2017-01-20 DIAGNOSIS — M5481 Occipital neuralgia: Secondary | ICD-10-CM | POA: Diagnosis not present

## 2017-01-20 DIAGNOSIS — M791 Myalgia: Secondary | ICD-10-CM | POA: Diagnosis not present

## 2017-01-20 DIAGNOSIS — M47816 Spondylosis without myelopathy or radiculopathy, lumbar region: Secondary | ICD-10-CM | POA: Diagnosis not present

## 2017-01-22 DIAGNOSIS — M545 Low back pain: Secondary | ICD-10-CM | POA: Diagnosis not present

## 2017-01-22 DIAGNOSIS — M4722 Other spondylosis with radiculopathy, cervical region: Secondary | ICD-10-CM | POA: Diagnosis not present

## 2017-01-24 DIAGNOSIS — M79601 Pain in right arm: Secondary | ICD-10-CM | POA: Diagnosis not present

## 2017-01-24 DIAGNOSIS — R202 Paresthesia of skin: Secondary | ICD-10-CM | POA: Diagnosis not present

## 2017-01-24 DIAGNOSIS — M545 Low back pain: Secondary | ICD-10-CM | POA: Diagnosis not present

## 2017-01-24 DIAGNOSIS — M5416 Radiculopathy, lumbar region: Secondary | ICD-10-CM | POA: Diagnosis not present

## 2017-01-24 DIAGNOSIS — R200 Anesthesia of skin: Secondary | ICD-10-CM | POA: Diagnosis not present

## 2017-01-24 DIAGNOSIS — M542 Cervicalgia: Secondary | ICD-10-CM | POA: Diagnosis not present

## 2017-01-30 ENCOUNTER — Encounter: Payer: Self-pay | Admitting: Emergency Medicine

## 2017-01-30 ENCOUNTER — Emergency Department: Payer: PPO

## 2017-01-30 ENCOUNTER — Emergency Department
Admission: EM | Admit: 2017-01-30 | Discharge: 2017-01-30 | Disposition: A | Discharge status: Home / Self Care | Payer: PPO | Attending: Emergency Medicine | Admitting: Emergency Medicine

## 2017-01-30 DIAGNOSIS — Z79899 Other long term (current) drug therapy: Secondary | ICD-10-CM | POA: Diagnosis not present

## 2017-01-30 DIAGNOSIS — M545 Low back pain: Secondary | ICD-10-CM | POA: Diagnosis not present

## 2017-01-30 DIAGNOSIS — S299XXA Unspecified injury of thorax, initial encounter: Secondary | ICD-10-CM | POA: Diagnosis not present

## 2017-01-30 DIAGNOSIS — F17210 Nicotine dependence, cigarettes, uncomplicated: Secondary | ICD-10-CM | POA: Diagnosis not present

## 2017-01-30 DIAGNOSIS — R400 Somnolence: Secondary | ICD-10-CM | POA: Insufficient documentation

## 2017-01-30 DIAGNOSIS — S0990XA Unspecified injury of head, initial encounter: Secondary | ICD-10-CM | POA: Diagnosis not present

## 2017-01-30 DIAGNOSIS — T148XXA Other injury of unspecified body region, initial encounter: Secondary | ICD-10-CM | POA: Diagnosis not present

## 2017-01-30 DIAGNOSIS — G9340 Encephalopathy, unspecified: Secondary | ICD-10-CM | POA: Diagnosis not present

## 2017-01-30 DIAGNOSIS — M542 Cervicalgia: Secondary | ICD-10-CM | POA: Diagnosis not present

## 2017-01-30 DIAGNOSIS — R404 Transient alteration of awareness: Secondary | ICD-10-CM | POA: Diagnosis not present

## 2017-01-30 DIAGNOSIS — Z716 Tobacco abuse counseling: Secondary | ICD-10-CM | POA: Diagnosis not present

## 2017-01-30 DIAGNOSIS — R4182 Altered mental status, unspecified: Secondary | ICD-10-CM | POA: Diagnosis not present

## 2017-01-30 DIAGNOSIS — R42 Dizziness and giddiness: Secondary | ICD-10-CM | POA: Diagnosis not present

## 2017-01-30 LAB — URINE DRUG SCREEN, QUALITATIVE (ARMC ONLY)
AMPHETAMINES, UR SCREEN: NOT DETECTED
BENZODIAZEPINE, UR SCRN: NOT DETECTED
Barbiturates, Ur Screen: NOT DETECTED
Cannabinoid 50 Ng, Ur ~~LOC~~: NOT DETECTED
Cocaine Metabolite,Ur ~~LOC~~: NOT DETECTED
MDMA (Ecstasy)Ur Screen: NOT DETECTED
Methadone Scn, Ur: NOT DETECTED
OPIATE, UR SCREEN: POSITIVE — AB
PHENCYCLIDINE (PCP) UR S: NOT DETECTED
Tricyclic, Ur Screen: NOT DETECTED

## 2017-01-30 LAB — CBC
HEMATOCRIT: 41.3 % (ref 35.0–47.0)
HEMOGLOBIN: 13.9 g/dL (ref 12.0–16.0)
MCH: 31 pg (ref 26.0–34.0)
MCHC: 33.7 g/dL (ref 32.0–36.0)
MCV: 91.9 fL (ref 80.0–100.0)
Platelets: 205 10*3/uL (ref 150–440)
RBC: 4.5 MIL/uL (ref 3.80–5.20)
RDW: 13.9 % (ref 11.5–14.5)
WBC: 9.5 10*3/uL (ref 3.6–11.0)

## 2017-01-30 LAB — URINALYSIS, COMPLETE (UACMP) WITH MICROSCOPIC
Bacteria, UA: NONE SEEN
Bilirubin Urine: NEGATIVE
GLUCOSE, UA: NEGATIVE mg/dL
KETONES UR: NEGATIVE mg/dL
LEUKOCYTES UA: NEGATIVE
Nitrite: NEGATIVE
PROTEIN: NEGATIVE mg/dL
Specific Gravity, Urine: 1.004 — ABNORMAL LOW (ref 1.005–1.030)
pH: 6 (ref 5.0–8.0)

## 2017-01-30 LAB — COMPREHENSIVE METABOLIC PANEL
ALT: 12 U/L — AB (ref 14–54)
AST: 20 U/L (ref 15–41)
Albumin: 4 g/dL (ref 3.5–5.0)
Alkaline Phosphatase: 53 U/L (ref 38–126)
Anion gap: 7 (ref 5–15)
BUN: 6 mg/dL (ref 6–20)
CALCIUM: 9.1 mg/dL (ref 8.9–10.3)
CO2: 27 mmol/L (ref 22–32)
CREATININE: 0.6 mg/dL (ref 0.44–1.00)
Chloride: 106 mmol/L (ref 101–111)
Glucose, Bld: 103 mg/dL — ABNORMAL HIGH (ref 65–99)
Potassium: 3.6 mmol/L (ref 3.5–5.1)
Sodium: 140 mmol/L (ref 135–145)
Total Bilirubin: 0.7 mg/dL (ref 0.3–1.2)
Total Protein: 7.5 g/dL (ref 6.5–8.1)

## 2017-01-30 LAB — TROPONIN I

## 2017-01-30 LAB — ACETAMINOPHEN LEVEL

## 2017-01-30 MED ORDER — SODIUM CHLORIDE 0.9 % IV SOLN
1000.0000 mL | Freq: Once | INTRAVENOUS | Status: AC
  Administered 2017-01-30: 1000 mL via INTRAVENOUS

## 2017-01-30 NOTE — Consult Note (Signed)
Medical Consultation  Karen Edwards SHF:[id number] DOB: 08-03-1966 DOA: 01/30/2017 PCP: Sheral Flow, NP   Requesting physician: Dr. Corky Downs Date of consultation: 01/30/2017 Reason for consultation: Altered mental status  Impression/Recommendations  51 year old female with history of depression and chronic back pain on multiple sedative medications he presented with altered mental status.  1. Acute encephalopathy in the setting of polypharmacy Patient appears to be back to her baseline Advise patient not to take so many sedative medications at one time including hydrocodone Ambulate patient  2. Chronic back pain: Patient has been taking these medications for several years She will continue them with the advice to not take hydrocodone with her other sedative medications.  3. Tobacco dependence: Patient is encouraged to quit smoking. Counseling was provided for 4 minutes.   Chief Complaint:  Confusion  HPI:  51 year old female with chronic back pain on multiple sedative medications is brought in via EMS due to multiple falls this morning. Patient reports that she woke up in the middle the night to use the restroom and she fell off the toilet. Her husband reports she had 2 falls after that where she just felt very limp. Patient reports that she took her nighttime dose of trazodone, Neurontin and baclofen however also took hydrocodone. She reportedly had confusion earlier today however is now back to her baseline mental status.   Review of Systems  Constitutional: Negative for fever, chills weight loss HENT: Negative for ear pain, nosebleeds, congestion, facial swelling, rhinorrhea, neck pain, neck stiffness and ear discharge.   Respiratory: Negative for cough, shortness of breath, wheezing  Cardiovascular: Negative for chest pain, palpitations and leg swelling.  Gastrointestinal: Negative for heartburn, abdominal pain, vomiting, diarrhea or consitpation Genitourinary:  Negative for dysuria, urgency, frequency, hematuria Musculoskeletal: She has chronic back pain  Neurological: Apparently earlier she felt dizziness, no seizures, syncope, focal weakness,  numbness and headaches.  Hematological: Does not bruise/bleed easily.  Psychiatric/Behavioral: Negative for hallucinations, confusion,  She has a diagnosis of depression  Past Medical History:  Diagnosis Date  . Chronic back pain   . Depression   . Hypotension   . IBS (irritable bowel syndrome) 05/2015  . MVP (mitral valve prolapse)    Past Surgical History:  Procedure Laterality Date  . ANKLE SURGERY Right 2015  . CERVICAL SPINE SURGERY    . CESAREAN SECTION     x 2  . DILATION AND CURETTAGE OF UTERUS    . HEMORRHOID SURGERY N/A 06/16/2015   Procedure: HEMORRHOIDECTOMY;  Surgeon: Leonie Green, MD;  Location: [facility] ORS;  Service: General;  Laterality: N/A;  . HYSTEROSCOPY    . OTHER SURGICAL HISTORY Right 2015   ankle   Social History:  reports that she has been smoking Cigarettes.  She has been smoking about 0.50 packs per day. She has never used smokeless tobacco. She reports that she drinks about 0.6 oz of alcohol per week . She reports that she does not use drugs.  Allergies  Allergen Reactions  . No Known Allergies    Family History  Problem Relation Age of Onset  . Arthritis Mother   . Depression Mother   . Hyperlipidemia Mother   . Hypertension Mother   . Kidney disease Mother   . Mental illness Mother   . Vision loss Mother   . Varicose Veins Mother   . Hearing loss Father   . Hyperlipidemia Father   . Hypertension Father     Prior to Admission medications   Medication Sig Start  Date End Date Taking? Authorizing Provider  baclofen (LIORESAL) 10 MG tablet Limit  2 - 4 tablets by mouth per day if tolerated.   NO ZANAFLEX (TIZANIDINE) 07/08/16  Yes Mohammed Kindle, MD  Cyanocobalamin (VITAMIN B-12) 5000 MCG TBDP Take 2,500 mcg by mouth daily.    Yes Historical Provider, MD   esomeprazole (NEXIUM) 20 MG capsule Take 20 mg by mouth daily at 12 noon.   Yes Historical Provider, MD  FLUoxetine (PROZAC) 20 MG capsule TAKE THREE CAPSULES BY MOUTH ONCE DAILY 12/03/16  Yes Pleas Koch, NP  gabapentin (NEURONTIN) 300 MG capsule Take 300 mg by mouth 4 (four) times daily. 01/29/17  Yes Historical Provider, MD  medroxyPROGESTERone (PROVERA) 10 MG tablet Take 10 mg by mouth daily.    Yes Historical Provider, MD  Omega-3 Fatty Acids (FISH OIL) 1000 MG CAPS Take 1,000 mg by mouth daily. 09/27/16  Yes Historical Provider, MD  traZODone (DESYREL) 100 MG tablet Take 300 mg by mouth at bedtime. 12/31/16  Yes Historical Provider, MD  zolpidem (AMBIEN) 5 MG tablet Take 1 tablet (5 mg total) by mouth at bedtime as needed for sleep. 01/10/17  Yes Pleas Koch, NP  busPIRone (BUSPAR) 7.5 MG tablet Take 1 tablet (7.5 mg total) by mouth 2 (two) times daily. Patient not taking: Reported on 01/10/2017 07/11/16   Pleas Koch, NP  HYDROcodone-acetaminophen (NORCO/VICODIN) 5-325 MG tablet Limit 1 tab by mouth per day or twice per day if tolerated Patient not taking: Reported on 01/30/2017 07/08/16   Mohammed Kindle, MD    Physical Exam: Blood pressure (!) 146/76, pulse (!) 51, temperature 98.5 F (36.9 C), temperature source Oral, resp. rate 15, height 5\' 6"  (1.676 m), weight 63.5 kg (140 lb), last menstrual period 11/11/2014, SpO2 97 %. @VITALS2 @ [employer]   01/30/17 0748  Weight: 63.5 kg (140 lb)    Intake/Output Summary (Last 24 hours) at 01/30/17 1347 Last data filed at 01/30/17 1153  Gross per 24 hour  Intake             1000 ml  Output                0 ml  Net             1000 ml     Constitutional: Appears well-developed and well-nourished. No distress. HENT: Normocephalic. Marland Kitchen Oropharynx is clear and moist.  Eyes: Conjunctivae and EOM are normal. PERRLA, no scleral icterus.  Neck: Normal ROM. Neck supple. No JVD. No tracheal deviation. CVS: RRR, S1/S2 +, no murmurs, no  gallops, no carotid bruit.  Pulmonary: Effort and breath sounds normal, no stridor, rhonchi, wheezes, rales.  Abdominal: Soft. BS +,  no distension, tenderness, rebound or guarding.  Musculoskeletal: Normal range of motion. No edema and no tenderness.  Neuro: Alert. CN 2-12 grossly intact. No focal deficits. Skin: Skin is warm and dry. No rash noted. Psychiatric: Normal mood and affect.    Labs  Basic Metabolic Panel:  Recent Labs Lab 01/30/17 0753  NA 140  K 3.6  CL 106  CO2 27  GLUCOSE 103*  BUN 6  CREATININE 0.60  CALCIUM 9.1   Liver Function Tests:  Recent Labs Lab 01/30/17 0753  AST 20  ALT 12*  ALKPHOS 53  BILITOT 0.7  PROT 7.5  ALBUMIN 4.0   No results for input(s): LIPASE, AMYLASE in the last 168 hours.  CBC:  Recent Labs Lab 01/30/17 0753  WBC 9.5  HGB 13.9  HCT  41.3  MCV 91.9  PLT 205   Cardiac Enzymes:  Recent Labs Lab 01/30/17 0753  TROPONINI <0.03   BNP: Invalid input(s): POCBNP CBG: No results for input(s): GLUCAP in the last 168 hours.  Radiological Exams: Dg Chest 1 View  Result Date: 01/30/2017 CLINICAL DATA:  Dizziness, multiple falls EXAM: CHEST 1 VIEW COMPARISON:  07/07/2006 FINDINGS: The heart size and mediastinal contours are within normal limits. Both lungs are clear. The visualized skeletal structures are unremarkable. IMPRESSION: No active disease. Electronically Signed   By: Kathreen Devoid   On: 01/30/2017 08:18   Ct Head Wo Contrast  Result Date: 01/30/2017 CLINICAL DATA:  Fall.  Altered mental status EXAM: CT HEAD WITHOUT CONTRAST TECHNIQUE: Contiguous axial images were obtained from the base of the skull through the vertex without intravenous contrast. COMPARISON:  None. FINDINGS: Brain: No evidence of acute infarction, hemorrhage, hydrocephalus, extra-axial collection or mass lesion/mass effect. Vascular: No hyperdense vessel or unexpected calcification. Skull: Negative Sinuses/Orbits: Negative Other: None IMPRESSION:  Negative CT head Electronically Signed   By: Franchot Gallo M.D.   On: 01/30/2017 08:21    EKG: Normal sinus rhythm no ST elevation or depression   Thank you for allowing me to participate in the care of your patient.  Patient can be discharged home after ambulation.  Case discussed with Dr. Corky Downs as well as family at bedside.  Note: This dictation was prepared with Dragon dictation along with smaller phrase technology. Any transcriptional errors that result from this process are unintentional.  Time spent: 45 minutes  Dakari Stabler, MD

## 2017-01-30 NOTE — ED Notes (Signed)
Dr. Corky Downs in room to re-assess patient.  Will continue to monitor.

## 2017-01-30 NOTE — ED Provider Notes (Signed)
Patient appears to be improving, she is asking for coffee. Seen by Dr. Genia Harold of internal medicine. We agree that we will hold her in the emergency department to see if she could be discharged   Lavonia Drafts, MD 01/30/17 1408

## 2017-01-30 NOTE — ED Triage Notes (Signed)
Per [facility], patient comes from home. Hx of chronic back and neck pain. Patient takes hydrocodone and trazodone for same. Patient denies taking too much of her medication. Patient states she kept falling this morning. Patient c/o dizziness while lying down. Patient denies LOC, hitting head or taking any blood thinners. Patient alert to verbal stimulus.

## 2017-01-30 NOTE — ED Notes (Signed)
Dr. Corky Downs in room to reassess patient and discuss plan of care with family.

## 2017-01-30 NOTE — ED Provider Notes (Signed)
[facility] Emergency Department Provider Note   ____________________________________________    I have reviewed the triage vital signs and the nursing notes.   HISTORY  Chief Complaint Altered Mental Status     HPI NADIE FIUMARA is a 51 y.o. female who presents with dizziness. Patient reports she woke up to use the bathroom this morning and was dizzy and fell on the way to the bathroom and then states that she fell off of the toilet. Husband called EMS because she "seemed out of it". Patient reports she has chronic neck pain for which she takes narcotics. She denies overdose to me. She denies chest pain or shortness of breath or palpitations.   Past Medical History:  Diagnosis Date  . Chronic back pain   . Depression   . Hypotension   . IBS (irritable bowel syndrome) 05/2015  . MVP (mitral valve prolapse)     Patient Active Problem List   Diagnosis Date Noted  . Encounter for routine adult medical exam with abnormal findings 07/11/2016  . Hyperlipidemia 07/11/2016  . Insomnia 01/09/2016  . Chronic constipation 01/09/2016  . Depression 03/21/2015  . Rectal bleeding 03/21/2015  . DDD (degenerative disc disease), lumbosacral 03/19/2015  . Lumbar radicular pain 03/19/2015  . Sacroiliac joint dysfunction of both sides 03/19/2015  . Complex regional pain syndrome of lower extremity 03/19/2015  . DDD (degenerative disc disease), lumbar 03/07/2015  . DDD (degenerative disc disease), cervical 03/07/2015  . Bilateral occipital neuralgia 03/07/2015  . 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 Menorrhagia 02/01/2013  . Fibroids 02/01/2013  . Fatigue 02/01/2013    Past Surgical History:  Procedure Laterality Date  . ANKLE SURGERY Right 2015  . CERVICAL SPINE SURGERY    . CESAREAN SECTION     x 2  . DILATION AND CURETTAGE OF UTERUS    . HEMORRHOID SURGERY N/A 06/16/2015   Procedure: HEMORRHOIDECTOMY;  Surgeon: Leonie Green, MD;  Location:  [facility] ORS;  Service: General;  Laterality: N/A;  . HYSTEROSCOPY    . OTHER SURGICAL HISTORY Right 2015   ankle    Prior to Admission medications   Medication Sig Start Date End Date Taking? Authorizing Provider  baclofen (LIORESAL) 10 MG tablet Limit  2 - 4 tablets by mouth per day if tolerated.   NO ZANAFLEX (TIZANIDINE) 07/08/16  Yes Mohammed Kindle, MD  Cyanocobalamin (VITAMIN B-12) 5000 MCG TBDP Take 2,500 mcg by mouth daily.    Yes Historical Provider, MD  esomeprazole (NEXIUM) 20 MG capsule Take 20 mg by mouth daily at 12 noon.   Yes Historical Provider, MD  FLUoxetine (PROZAC) 20 MG capsule TAKE THREE CAPSULES BY MOUTH ONCE DAILY 12/03/16  Yes Pleas Koch, NP  gabapentin (NEURONTIN) 300 MG capsule Take 300 mg by mouth 4 (four) times daily. 01/29/17  Yes Historical Provider, MD  medroxyPROGESTERone (PROVERA) 10 MG tablet Take 10 mg by mouth daily.    Yes Historical Provider, MD  Omega-3 Fatty Acids (FISH OIL) 1000 MG CAPS Take 1,000 mg by mouth daily. 09/27/16  Yes Historical Provider, MD  traZODone (DESYREL) 100 MG tablet Take 300 mg by mouth at bedtime. 12/31/16  Yes Historical Provider, MD  zolpidem (AMBIEN) 5 MG tablet Take 1 tablet (5 mg total) by mouth at bedtime as needed for sleep. 01/10/17  Yes Pleas Koch, NP  busPIRone (BUSPAR) 7.5 MG tablet Take 1 tablet (7.5 mg total) by mouth 2 (two) times daily. Patient not taking: Reported on 01/10/2017 07/11/16  Pleas Koch, NP  HYDROcodone-acetaminophen (NORCO/VICODIN) 5-325 MG tablet Limit 1 tab by mouth per day or twice per day if tolerated Patient not taking: Reported on 01/30/2017 07/08/16   Mohammed Kindle, MD     Allergies No known allergies  Family History  Problem Relation Age of Onset  . Arthritis Mother   . Depression Mother   . Hyperlipidemia Mother   . Hypertension Mother   . Kidney disease Mother   . Mental illness Mother   . Vision loss Mother   . Varicose Veins Mother   . Hearing loss Father   .  Hyperlipidemia Father   . Hypertension Father     Social History Social History  Substance Use Topics  . Smoking status: Current Every Day Smoker    Packs/day: 0.50    Types: Cigarettes  . Smokeless tobacco: Never Used  . Alcohol use 0.6 oz/week    1 Cans of beer per week     Comment: drinks an occasional beer    Review of Systems  Constitutional: No fever/chills Eyes: No visual changes.   Cardiovascular: Denies chest pain. Respiratory: Denies shortness of breath. Gastrointestinal: No abdominal pain.  No nausea, no vomiting.    Musculoskeletal: Negative for back pain. Skin: Negative for rash. Neurological: Negative for headaches  10-point ROS otherwise negative.  ____________________________________________   PHYSICAL EXAM:  VITAL SIGNS: ED Triage Vitals  Enc Vitals Group     BP 01/30/17 0758 113/74     Pulse Rate 01/30/17 0755 70     Resp 01/30/17 0755 15     Temp 01/30/17 0755 98.5 F (36.9 C)     Temp Source 01/30/17 0755 Oral     SpO2 01/30/17 0755 99 %     Weight 01/30/17 0748 140 lb (63.5 kg)     Height 01/30/17 0748 5\' 6"  (1.676 m)     Head Circumference --      Peak Flow --      Pain Score 01/30/17 0747 7     Pain Loc --      Pain Edu? --      Excl. in [place]? --     Constitutional: Alert and oriented. No acute distress.  Eyes: Conjunctivae are normal.   Nose: No congestion/rhinnorhea. Mouth/Throat: Mucous membranes are moist.   Neck:  No vertebral tenderness to palpation Cardiovascular: Normal rate, regular rhythm. Grossly normal heart sounds.  Good peripheral circulation. Respiratory: Normal respiratory effort.  No retractions. Lungs CTAB. Gastrointestinal: Soft and nontender. No distention.  No CVA tenderness. Genitourinary: deferred Musculoskeletal: No lower extremity tenderness nor edema.  Warm and well perfused Neurologic:  Normal speech and language. No gross focal neurologic deficits are appreciated.  Skin:  Skin is warm, dry and intact.  No rash noted. Psychiatric: Mood and affect are normal. Speech and behavior are normal.  ____________________________________________   LABS (all labs ordered are listed, but only abnormal results are displayed)  Labs Reviewed  COMPREHENSIVE METABOLIC PANEL - Abnormal; Notable for the following:       Result Value   Glucose, Bld 103 (*)    ALT 12 (*)    All other components within normal limits  ACETAMINOPHEN LEVEL - Abnormal; Notable for the following:    Acetaminophen (Tylenol), Serum <10 (*)    All other components within normal limits  URINE DRUG SCREEN, QUALITATIVE ([facility] ONLY) - Abnormal; Notable for the following:    Opiate, Ur Screen POSITIVE (*)    All other components within normal limits  URINALYSIS, COMPLETE (UACMP) WITH MICROSCOPIC - Abnormal; Notable for the following:    Color, Urine STRAW (*)    APPearance CLEAR (*)    Specific Gravity, Urine 1.004 (*)    Hgb urine dipstick SMALL (*)    Squamous Epithelial / LPF 0-5 (*)    All other components within normal limits  CBC  TROPONIN I   ____________________________________________  EKG  ED ECG REPORT I, Lavonia Drafts, the attending physician, personally viewed and interpreted this ECG.  Date: 01/30/2017  Rate: 57 Rhythm: normal sinus rhythm QRS Axis: normal Intervals: normal ST/T Wave abnormalities: normal Conduction Disturbances: none Narrative Interpretation: unremarkable  ____________________________________________  RADIOLOGY  CT head unremarkable, chest x-ray unremarkable ____________________________________________   PROCEDURES  Procedure(s) performed: No    Critical Care performed: No ____________________________________________   INITIAL IMPRESSION / ASSESSMENT AND PLAN / ED COURSE  Pertinent labs & imaging results that were available during my care of the patient were reviewed by me and considered in my medical decision making (see chart for details).  Patient presents with  reported dizziness/possible syncopal episodes. She is somewhat somnolent in the day. Vital signs are stable emergency department. We will check labs, EKG, chest x-ray, CT head given IV fluids and reevaluate.   ----------------------------------------- 12:47 PM on 01/30/2017 -----------------------------------------  Labwork reassuring however patient remains somewhat somnolent. I suspect polypharmacy no evidence of overdose. Elevated to the hospital for further management    ____________________________________________   FINAL CLINICAL IMPRESSION(S) / ED DIAGNOSES  Final diagnoses:  Somnolence  Altered mental status    NEW MEDICATIONS STARTED DURING THIS VISIT:  New Prescriptions   No medications on file     Note:  This document was prepared using Dragon voice recognition software and may include unintentional dictation errors.    Lavonia Drafts, MD 01/30/17 1248

## 2017-01-30 NOTE — ED Notes (Signed)
Patient is now awake and alert and drinking coffee.  Patient states she wants to go home.  MD aware.  Will continue to monitor.

## 2017-02-11 DIAGNOSIS — M542 Cervicalgia: Secondary | ICD-10-CM | POA: Diagnosis not present

## 2017-02-11 DIAGNOSIS — M4802 Spinal stenosis, cervical region: Secondary | ICD-10-CM | POA: Diagnosis not present

## 2017-02-11 DIAGNOSIS — M79601 Pain in right arm: Secondary | ICD-10-CM | POA: Diagnosis not present

## 2017-02-11 DIAGNOSIS — M48061 Spinal stenosis, lumbar region without neurogenic claudication: Secondary | ICD-10-CM | POA: Diagnosis not present

## 2017-02-11 DIAGNOSIS — R202 Paresthesia of skin: Secondary | ICD-10-CM | POA: Diagnosis not present

## 2017-02-11 DIAGNOSIS — M5416 Radiculopathy, lumbar region: Secondary | ICD-10-CM | POA: Diagnosis not present

## 2017-02-11 DIAGNOSIS — R200 Anesthesia of skin: Secondary | ICD-10-CM | POA: Diagnosis not present

## 2017-03-03 DIAGNOSIS — M47816 Spondylosis without myelopathy or radiculopathy, lumbar region: Secondary | ICD-10-CM | POA: Diagnosis not present

## 2017-03-13 DIAGNOSIS — M47816 Spondylosis without myelopathy or radiculopathy, lumbar region: Secondary | ICD-10-CM | POA: Diagnosis not present

## 2017-03-26 ENCOUNTER — Encounter: Payer: Self-pay | Admitting: Primary Care

## 2017-03-27 ENCOUNTER — Other Ambulatory Visit: Payer: Self-pay | Admitting: Primary Care

## 2017-03-27 ENCOUNTER — Other Ambulatory Visit: Payer: Self-pay | Admitting: Obstetrics and Gynecology

## 2017-03-27 DIAGNOSIS — G4700 Insomnia, unspecified: Secondary | ICD-10-CM

## 2017-03-27 NOTE — Telephone Encounter (Signed)
Ok to refill? Electronically refill request for traZODone (DESYREL) 100 MG tablet. Last prescribed on 10/02/2016. Last seen on 01/10/2017

## 2017-03-28 ENCOUNTER — Encounter: Payer: Self-pay | Admitting: Primary Care

## 2017-03-28 DIAGNOSIS — G4700 Insomnia, unspecified: Secondary | ICD-10-CM

## 2017-03-28 MED ORDER — TRAZODONE HCL 100 MG PO TABS: 300.0000 mg | ORAL_TABLET | Freq: Every day | ORAL | 1 refills | Status: DC

## 2017-03-28 NOTE — Telephone Encounter (Signed)
Pt returned your call. She is still taking the Trazodone- the [place] did not work for her.

## 2017-03-28 NOTE — Telephone Encounter (Signed)
Is she still taking Trazodone? Based off of my notes from March she was trialed on low dose Ambien. Looks like she's not refilled Ambien, so what's she taking? Trazodone or Ambien?

## 2017-03-28 NOTE — Telephone Encounter (Signed)
Noted, will ask additional questions through My Chart.

## 2017-03-28 NOTE — Telephone Encounter (Signed)
Per DPR, left detail message of Kate's comments for patient to call back. 

## 2017-04-04 DIAGNOSIS — M5020 Other cervical disc displacement, unspecified cervical region: Secondary | ICD-10-CM | POA: Diagnosis not present

## 2017-04-04 DIAGNOSIS — M5090 Cervical disc disorder, unspecified, unspecified cervical region: Secondary | ICD-10-CM | POA: Diagnosis not present

## 2017-04-07 DIAGNOSIS — M47812 Spondylosis without myelopathy or radiculopathy, cervical region: Secondary | ICD-10-CM | POA: Diagnosis not present

## 2017-04-09 DIAGNOSIS — M5481 Occipital neuralgia: Secondary | ICD-10-CM | POA: Diagnosis not present

## 2017-04-09 DIAGNOSIS — M5033 Other cervical disc degeneration, cervicothoracic region: Secondary | ICD-10-CM | POA: Diagnosis not present

## 2017-04-09 DIAGNOSIS — M5416 Radiculopathy, lumbar region: Secondary | ICD-10-CM | POA: Diagnosis not present

## 2017-04-09 DIAGNOSIS — M542 Cervicalgia: Secondary | ICD-10-CM | POA: Diagnosis not present

## 2017-04-09 DIAGNOSIS — M47817 Spondylosis without myelopathy or radiculopathy, lumbosacral region: Secondary | ICD-10-CM | POA: Diagnosis not present

## 2017-04-09 DIAGNOSIS — M47816 Spondylosis without myelopathy or radiculopathy, lumbar region: Secondary | ICD-10-CM | POA: Diagnosis not present

## 2017-04-09 DIAGNOSIS — M5136 Other intervertebral disc degeneration, lumbar region: Secondary | ICD-10-CM | POA: Diagnosis not present

## 2017-04-09 DIAGNOSIS — M533 Sacrococcygeal disorders, not elsewhere classified: Secondary | ICD-10-CM | POA: Diagnosis not present

## 2017-04-09 DIAGNOSIS — M5031 Other cervical disc degeneration,  high cervical region: Secondary | ICD-10-CM | POA: Diagnosis not present

## 2017-04-09 DIAGNOSIS — M791 Myalgia: Secondary | ICD-10-CM | POA: Diagnosis not present

## 2017-04-09 DIAGNOSIS — Z79891 Long term (current) use of opiate analgesic: Secondary | ICD-10-CM | POA: Diagnosis not present

## 2017-04-09 DIAGNOSIS — M5137 Other intervertebral disc degeneration, lumbosacral region: Secondary | ICD-10-CM | POA: Diagnosis not present

## 2017-04-16 ENCOUNTER — Ambulatory Visit: Payer: Self-pay | Admitting: Primary Care

## 2017-04-16 DIAGNOSIS — Z0289 Encounter for other administrative examinations: Secondary | ICD-10-CM

## 2017-05-01 DIAGNOSIS — M47812 Spondylosis without myelopathy or radiculopathy, cervical region: Secondary | ICD-10-CM | POA: Diagnosis not present

## 2017-05-05 ENCOUNTER — Ambulatory Visit (INDEPENDENT_AMBULATORY_CARE_PROVIDER_SITE_OTHER): Payer: PPO | Admitting: Primary Care

## 2017-05-05 ENCOUNTER — Encounter: Payer: Self-pay | Admitting: Primary Care

## 2017-05-05 ENCOUNTER — Ambulatory Visit (INDEPENDENT_AMBULATORY_CARE_PROVIDER_SITE_OTHER)
Admission: RE | Admit: 2017-05-05 | Discharge: 2017-05-05 | Disposition: A | Discharge status: Home / Self Care | Payer: PPO | Source: Ambulatory Visit | Attending: Primary Care | Admitting: Primary Care

## 2017-05-05 VITALS — BP 124/84 | HR 68 | Temp 98.5°F | Ht 66.0 in | Wt 141.1 lb

## 2017-05-05 DIAGNOSIS — M25522 Pain in left elbow: Secondary | ICD-10-CM

## 2017-05-05 DIAGNOSIS — R079 Chest pain, unspecified: Secondary | ICD-10-CM | POA: Diagnosis not present

## 2017-05-05 DIAGNOSIS — M792 Neuralgia and neuritis, unspecified: Secondary | ICD-10-CM

## 2017-05-05 MED ORDER — NAPROXEN 500 MG PO TABS: 500.0000 mg | ORAL_TABLET | Freq: Two times a day (BID) | ORAL | 0 refills | Status: DC

## 2017-05-05 NOTE — Progress Notes (Signed)
Subjective:    Patient ID: Karen Edwards, female    DOB: 1966/06/11, 51 y.o.   MRN: [id number]  HPI  Karen Edwards is a 51 year old female with a history of chronic back pain who presents today with multiple complaints.  1) Chest Pain: Located to the skin and also internally to her right upper chest. She describes symptoms of burning, numbness, sharp that is constant but worse at times. This has been going on for the past 8 months. She denies cough, shortness of breath, lower extremity edema, rash, left sided chest pain, weakness. She does have a history of cervical bone spurs and spondylosis, chronic lower back pain.   2) Joint Pain: Located to the left elbow that has been present for the past 6 months. She's noticed pain with extension, holding and grasping a can of soda, lifting objects. Overall her pain is about the same. She's not taken anything OTC for her symptoms. She has tried massage and ROM exercises without improvement. She denies erythema, swelling, injury/trauma.   Review of Systems  Constitutional: Negative for fatigue.  Respiratory: Negative for cough and shortness of breath.   Cardiovascular: Positive for chest pain. Negative for palpitations and leg swelling.  Musculoskeletal: Positive for arthralgias, back pain and neck pain. Negative for joint swelling.  Skin: Negative for color change and rash.  Neurological: Negative for weakness.       Past Medical History:  Diagnosis Date  . Chronic back pain   . Depression   . Hypotension   . IBS (irritable bowel syndrome) 05/2015  . MVP (mitral valve prolapse)      Social History   Social History  . Marital status: Married    Spouse name: N/A  . Number of children: N/A  . Years of education: N/A   Occupational History  . Not on file.   Social History Main Topics  . Smoking status: Current Every Day Smoker    Packs/day: 0.50    Types: Cigarettes  . Smokeless tobacco: Never Used  . Alcohol use 0.6 oz/week   1 Cans of beer per week     Comment: drinks an occasional beer  . Drug use: No  . Sexual activity: Yes   Other Topics Concern  . Not on file   Social History Narrative   Married.   2 children. One boy and one girl.   On disability.   Playing with her dogs, gardening.       Past Surgical History:  Procedure Laterality Date  . ANKLE SURGERY Right 2015  . CERVICAL SPINE SURGERY    . CESAREAN SECTION     x 2  . DILATION AND CURETTAGE OF UTERUS    . HEMORRHOID SURGERY N/A 06/16/2015   Procedure: HEMORRHOIDECTOMY;  Surgeon: Karen Green, MD;  Location: [facility] ORS;  Service: General;  Laterality: N/A;  . HYSTEROSCOPY    . OTHER SURGICAL HISTORY Right 2015   ankle    Family History  Problem Relation Age of Onset  . Arthritis Mother   . Depression Mother   . Hyperlipidemia Mother   . Hypertension Mother   . Kidney disease Mother   . Mental illness Mother   . Vision loss Mother   . Varicose Veins Mother   . Hearing loss Father   . Hyperlipidemia Father   . Hypertension Father     Allergies  Allergen Reactions  . No Known Allergies     Current Outpatient Prescriptions on File Prior to  Visit  Medication Sig Dispense Refill  . baclofen (LIORESAL) 10 MG tablet Limit  2 - 4 tablets by mouth per day if tolerated.   NO ZANAFLEX (TIZANIDINE) 110 each 0  . Cyanocobalamin (VITAMIN B-12) 5000 MCG TBDP Take 2,500 mcg by mouth daily.     Marland Kitchen FLUoxetine (PROZAC) 20 MG capsule TAKE THREE CAPSULES BY MOUTH ONCE DAILY 270 capsule 1  . medroxyPROGESTERone (PROVERA) 10 MG tablet Take 10 mg by mouth daily.     . Omega-3 Fatty Acids (FISH OIL) 1000 MG CAPS Take 1,000 mg by mouth daily.    . traZODone (DESYREL) 100 MG tablet Take 3 tablets (300 mg total) by mouth at bedtime. 270 tablet 1  . HYDROcodone-acetaminophen (NORCO/VICODIN) 5-325 MG tablet Limit 1 tab by mouth per day or twice per day if tolerated (Patient not taking: Reported on 01/30/2017) 60 tablet 0   Current  Facility-Administered Medications on File Prior to Visit  Medication Dose Route Frequency Provider Last Rate Last Dose  . lactated ringers infusion 1,000 mL  1,000 mL Intravenous Continuous Mohammed Kindle, MD 125 mL/hr at 03/20/16 1221 1,000 mL at 03/20/16 1221  . lactated ringers infusion 1,000 mL  1,000 mL Intravenous Continuous Mohammed Kindle, MD 125 mL/hr at 06/05/16 1149 1,000 mL at 06/05/16 1149    BP 124/84   Pulse 68   Temp 98.5 F (36.9 C) (Oral)   Ht 5\' 6"  (1.676 m)   Wt 141 lb 1.9 oz (64 kg)   LMP 11/11/2014   SpO2 98%   BMI 22.78 kg/m    Objective:   Physical Exam  Constitutional: She appears well-nourished.  Neck: Neck supple.  Cardiovascular: Normal rate and regular rhythm.   Pulmonary/Chest: Effort normal and breath sounds normal. She has no wheezes. She has no rales.  Musculoskeletal:       Left elbow: She exhibits normal range of motion, no swelling and no deformity. No tenderness found.  Skin: Skin is warm and dry. No erythema.          Assessment & Plan:  Neuropathic Pain of Chest:  Located to right chest x 8 months.  Symptoms and presentation today representative of neuropathic pain, especially given history of cervical and lumbar spondylosis.  Will check thoracic spine xray to rule out any involvement. It's possible that either her cervical or thoracic spine could be causing symptoms. No indication for cardiac cause. No rash or patter to suggest Zoster. No breast involvement, last mammogram unremarkable per GYN.  Tendonitis of Elbow:  Pain with grasping and lifting objects x 6 months. Exam today without swelling, erythema, deformity, tenderness. Could very well be tendonitis based off of HPI and exam. Will have her try Naproxen BID, stretching and strengthening exercises first. Handouts provided. Check xray today. If no improvement would recommend Sports Medicine evaluation. She verbalized understanding.   Sheral Flow, NP

## 2017-05-05 NOTE — Patient Instructions (Addendum)
Complete xray(s) prior to leaving today. I will notify you of your results once received.  Start Naproxen 500 mg tablets for tendon pain to the elbow. Take 1 tablet by mouth twice daily for 10 days.   Please schedule an appointment with Dr. Lorelei Pont in 2 weeks if no improvement.  It was a pleasure to see you today!   Elbow Bursitis Elbow bursitis is inflammation of the fluid-filled sac (bursa) between the tip of your elbow bone (olecranon) and your skin. Elbow bursitis may also be called olecranon bursitis. Normally, the olecranon bursa has only a small amount of fluid in it to cushion and protect your elbow bone. Elbow bursitis causes fluid to build up inside the bursa. Over time, this swelling and inflammation can cause pain when you bend or lean on your elbow. What are the causes? Elbow bursitis may be caused by:  Elbow injury (acute trauma).  Leaning on hard surfaces for long periods of time.  Infection from an injury that breaks the skin near your elbow.  A bone growth (spur) that forms at the tip of your elbow.  A medical condition that causes inflammation in your body, such as gout or rheumatoid arthritis.  The cause may also be unknown. What are the signs or symptoms? The first sign of elbow bursitis is usually swelling over the tip of your elbow. This can grow to be the size of a golf ball. This may start suddenly or develop gradually. You may also have:  Pain when bending or leaning on your elbow.  Restricted movement of your elbow.  If your bursitis is caused by an infection, symptoms may also include:  Redness, warmth, and tenderness of the elbow.  Drainage of pus from the swollen area over your elbow, if the skin breaks open.  How is this diagnosed? Your health care provider may be able to diagnose elbow bursitis based on your signs and symptoms, especially if you have recently been injured. Your health care provider will also do a physical exam. This may  include:  X-rays to look for a bone spur or a bone fracture.  Draining fluid from the bursa to test it for infection.  Blood tests to rule out gout or rheumatoid arthritis.  How is this treated? Treatment for elbow bursitis depends on the cause. Treatment may include:  Medicines. These may include: ? Over-the-counter medicines to relieve pain and inflammation. ? Antibiotic medicines to fight infection. ? Injections of anti-inflammatory medicines (steroids).  Wrapping your elbow with a bandage.  Draining fluid from the bursa.  Wearing elbow pads.  If your bursitis does not get better with treatment, surgery may be needed to remove the bursa. Follow these instructions at home:  Take medicines only as directed by your health care provider.  If you were prescribed an antibiotic medicine, finish all of it even if you start to feel better.  If your bursitis is caused by an injury, rest your elbow and wear your bandage as directed by your health care provider. You may alsoapply ice to the injured area as directed by your health care provider: ? Put ice in a plastic bag. ? Place a towel between your skin and the bag. ? Leave the ice on for 20 minutes, 2-3 times per day.  Avoid any activities that cause elbow pain.  Use elbow pads or elbow wraps to cushion your elbow. Contact a health care provider if:  You have a fever.  Your symptoms do not get better with  treatment.  Your pain or swelling gets worse.  Your elbow pain or swelling goes away and then returns.  You have drainage of pus from the swollen area over your elbow. This information is not intended to replace advice given to you by your health care provider. Make sure you discuss any questions you have with your health care provider. Document Released: 11/13/2006 Document Revised: 03/21/2016 Document Reviewed: 06/22/2014 Elsevier Interactive Patient Education  [employer].

## 2017-05-06 ENCOUNTER — Encounter: Payer: Self-pay | Admitting: Primary Care

## 2017-05-07 DIAGNOSIS — M47817 Spondylosis without myelopathy or radiculopathy, lumbosacral region: Secondary | ICD-10-CM | POA: Diagnosis not present

## 2017-05-07 DIAGNOSIS — M5416 Radiculopathy, lumbar region: Secondary | ICD-10-CM | POA: Diagnosis not present

## 2017-05-07 DIAGNOSIS — M533 Sacrococcygeal disorders, not elsewhere classified: Secondary | ICD-10-CM | POA: Diagnosis not present

## 2017-05-14 ENCOUNTER — Ambulatory Visit: Payer: Self-pay | Admitting: Obstetrics and Gynecology

## 2017-05-15 ENCOUNTER — Encounter: Payer: Self-pay | Admitting: Certified Nurse Midwife

## 2017-05-15 ENCOUNTER — Ambulatory Visit (INDEPENDENT_AMBULATORY_CARE_PROVIDER_SITE_OTHER): Payer: PPO | Admitting: Certified Nurse Midwife

## 2017-05-15 VITALS — BP 100/60 | HR 75 | Ht 61.0 in | Wt 140.0 lb

## 2017-05-15 DIAGNOSIS — Z124 Encounter for screening for malignant neoplasm of cervix: Secondary | ICD-10-CM

## 2017-05-15 DIAGNOSIS — N924 Excessive bleeding in the premenopausal period: Secondary | ICD-10-CM | POA: Diagnosis not present

## 2017-05-15 DIAGNOSIS — Z1239 Encounter for other screening for malignant neoplasm of breast: Secondary | ICD-10-CM

## 2017-05-15 DIAGNOSIS — Z01419 Encounter for gynecological examination (general) (routine) without abnormal findings: Secondary | ICD-10-CM

## 2017-05-15 DIAGNOSIS — Z1231 Encounter for screening mammogram for malignant neoplasm of breast: Secondary | ICD-10-CM

## 2017-05-15 NOTE — Progress Notes (Signed)
Gynecology Annual Exam  PCP: Pleas Koch, NP  Chief Complaint:  Chief Complaint  Patient presents with  . Menorrhagia    History of Present Illness: Karen Edwards is a 51 y.o. [phone] presented initially to get a refill of her Provera which she has been taking for menorrhagia since having a hysteroscopy/ D&C in 2015 The patient has been amenorrheic while on the provera daily. She ran out of provera the end of June and had a menses 9 July which lasted 6 days, with a heavy flow with clots, requiring a tampon change every hour.   She feels achy in her lower abdomen with the bleeding and tired. Last pap smear: 03/08/2015, results were NIL. No history of abnormal Pap smears   The patient is  sexually active. She currently uses TL for contraception. She does not have dyspareunia.  Since her last visit, she has had a hemorrhoidectomy and cervical spine surgery (discectomy C5-6 and C6-7 with bone allograph). She will be having more cervical spine surgery on 1 August. She also had a colonoscopy 04/19/2015.  Her past medical history is remarkable for anxiety/ depression, degenerative disc disease (cervical and lumbar), headache, and IBS.  The patient does perform occ self breast exams. Her last mammogram was 03/09/2015, results were negative..   There is no family history of breast cancer.    There is no family history of ovarian cancer.   The patient reports smoking 1-2 cigarettes/day, since the age of 14.  She drinks alcohol occasionally.   She denies illegal drug use.  The patient works in the yard and cleans her home for exercise    Review of Systems: Review of Systems  Constitutional: Negative for chills, fever and weight loss.  HENT: Negative for congestion, sinus pain and sore throat.   Eyes: Negative for blurred vision and pain.  Respiratory: Negative for hemoptysis, shortness of breath and wheezing.   Cardiovascular: Positive for chest pain (right  chest/shoulder pain). Negative for palpitations and leg swelling.  Gastrointestinal: Negative for abdominal pain, blood in stool, diarrhea, heartburn, nausea and vomiting.  Genitourinary: Negative for dysuria, frequency, hematuria and urgency.  Musculoskeletal: Positive for back pain, joint pain (left elbow (tendonitis)) and neck pain. Negative for myalgias.  Skin: Negative for itching and rash.  Neurological: Negative for dizziness, tingling and headaches.  Endo/Heme/Allergies: Negative for environmental allergies and polydipsia. Does not bruise/bleed easily.       Negative for hirsutism   Psychiatric/Behavioral: Positive for depression. The patient has insomnia. The patient is not nervous/anxious.     Past Medical History:  Past Medical History:  Diagnosis Date  . Anxiety   . Chronic back pain   . Degenerative joint disease   . Depression   . Hypotension   . IBS (irritable bowel syndrome) 05/2015  . MVP (mitral valve prolapse)     Past Surgical History:  Past Surgical History:  Procedure Laterality Date  . ANKLE SURGERY Right 03/02/2014   repair of tendon and sural nerve right ankle  . CERVICAL SPINE SURGERY  02/21/2016   arthrodesis of anterior cervical spine (fusion of C5-6,C6-7 and insertion of bone allograft  . CESAREAN SECTION    . CESAREAN SECTION WITH BILATERAL TUBAL LIGATION  1990  . COLONOSCOPY  04/19/2015  . HEMORRHOID SURGERY N/A 06/16/2015   Procedure: HEMORRHOIDECTOMY;  Surgeon: Leonie Green, MD;  Location: [facility] ORS;  Service: General;  Laterality: N/A;  . HYSTEROSCOPY W/D&C  12/24/2013    Family History:  Family History  Problem Relation Age of Onset  . Arthritis Mother   . Depression Mother   . Hyperlipidemia Mother   . Hypertension Mother   . Kidney disease Mother   . Vision loss Mother   . Hypothyroidism Mother   . Hearing loss Father   . Hyperlipidemia Father   . Hypertension Father   . Diabetes Mellitus I Maternal Grandmother   . Stroke  Maternal Grandmother   . Congestive Heart Failure Maternal Grandfather   . Heart attack Maternal Grandfather   . Prostate cancer Paternal Grandfather   . Thyroid disease Maternal Aunt   . Diabetes Maternal Aunt   . Gout Maternal Aunt     Social History:  Social History   Social History  . Marital status: Married    Spouse name: N/A  . Number of children: 2  . Years of education: N/A   Occupational History  . disabled    Social History Main Topics  . Smoking status: Current Every Day Smoker    Packs/day: 0.10    Years: 37.00    Types: Cigarettes  . Smokeless tobacco: Never Used  . Alcohol use 0.6 oz/week    1 Cans of beer per week     Comment: drinks an occasional beer  . Drug use: No  . Sexual activity: Yes    Birth control/ protection: None, Surgical     Comment: tubal ligation   Other Topics Concern  . Not on file   Social History Narrative   Married.   2 children. One boy and one girl.   On disability.   Playing with her dogs, gardening.       Allergies:  Allergies  Allergen Reactions  . No Known Allergies     Medications:  Current Outpatient Prescriptions:  .  baclofen (LIORESAL) 10 MG tablet, Limit  2 - 4 tablets by mouth per day if tolerated.   NO ZANAFLEX (TIZANIDINE), Disp: 110 each, Rfl: 0 .  Cyanocobalamin (VITAMIN B-12) 5000 MCG TBDP, Take 2,500 mcg by mouth daily. , Disp: , Rfl:  .  FLUoxetine (PROZAC) 20 MG capsule, TAKE THREE CAPSULES BY MOUTH ONCE DAILY, Disp: 270 capsule, Rfl: 1 .  HYDROcodone-acetaminophen (NORCO/VICODIN) 5-325 MG tablet, Limit 1 tab by mouth per day or twice per day if tolerated (Patient not taking: Reported on 01/30/2017), Disp: 60 tablet, Rfl: 0 .  Omega-3 Fatty Acids (FISH OIL) 1000 MG CAPS, Take 1,000 mg by mouth daily., Disp: , Rfl:  .  traZODone (DESYREL) 100 MG tablet, Take 3 tablets (300 mg total) by mouth at bedtime., Disp: 270 tablet, Rfl: 1  Current Facility-Administered Medications:  .  lactated ringers  infusion 1,000 mL, 1,000 mL, Intravenous, Continuous, Mohammed Kindle, MD, Last Rate: 125 mL/hr at 03/20/16 1221, 1,000 mL at 03/20/16 1221 .  lactated ringers infusion 1,000 mL, 1,000 mL, Intravenous, Continuous, Mohammed Kindle, MD, Last Rate: 125 mL/hr at 06/05/16 1149, 1,000 mL at 06/05/16 1149 Physical Exam Vitals: BP 100/60   Pulse 75   Ht 5\' 1"  (1.549 m)   Wt 140 lb (63.5 kg)   LMP 05/05/2017 (Exact Date)   BMI 26.45 kg/m   General: Wf in  NAD HEENT: normocephalic, anicteric Neck: no thyroid enlargement, no palpable nodules, no cervical lymphadenopathy  Pulmonary: No increased work of breathing, CTAB Cardiovascular: RRR, without murmur  Breast: Breast symmetrical, no tenderness, no palpable nodules or masses, no skin or nipple retraction present, no nipple discharge.  No axillary, infraclavicular or supraclavicular lymphadenopathy.  Abdomen: Soft, non-tender, non-distended.  Umbilicus without lesions.  No hepatomegaly or masses palpable. No evidence of hernia. Genitourinary:  External: Normal external female genitalia.  Normal urethral meatus, normal  Bartholin's and Skene's glands.    Vagina: Normal vaginal mucosa, no evidence of prolapse.    Cervix: Grossly normal in appearance, no bleeding, non-tender  Uterus: Anteverted, normal size, shape, and consistency, mobile, and non-tender  Adnexa: No adnexal masses, non-tender  Rectal: deferred  Lymphatic: no evidence of inguinal lymphadenopathy Extremities: no edema, erythema, or tenderness Neurologic: Grossly intact Psychiatric: mood appropriate, affect full     Assessment: 51 y.o. [id number] with history of amenorrhea. Has been amenorreic for 3 years on Provera Would like to resume Provera after having a heavy menses off the Provera.  Plan:  Discussed plan of management with Dr Georgianne Fick. Will schedule for ultrasound and have patient follow up with Dr Georgianne Fick regarding further medical or surgical treatment.   1) Breast cancer  screening - recommend monthly self breast exam and annual screening mammogram.. Mammogram was ordered today.  2) Cervical cancer screening - Pap was done.   3) Colon cncer screening-UTD-colonoscopy in 2016  4) Routine healthcare maintenance including cholesterol and diabetes screening managed by PCP.  Dalia Heading, CNM

## 2017-05-18 ENCOUNTER — Encounter: Payer: Self-pay | Admitting: Certified Nurse Midwife

## 2017-05-19 ENCOUNTER — Encounter: Payer: Self-pay | Admitting: Primary Care

## 2017-05-19 DIAGNOSIS — Z01818 Encounter for other preprocedural examination: Secondary | ICD-10-CM

## 2017-05-20 ENCOUNTER — Ambulatory Visit (INDEPENDENT_AMBULATORY_CARE_PROVIDER_SITE_OTHER): Payer: PPO | Admitting: Obstetrics and Gynecology

## 2017-05-20 ENCOUNTER — Other Ambulatory Visit: Payer: Self-pay | Admitting: Certified Nurse Midwife

## 2017-05-20 ENCOUNTER — Encounter: Payer: Self-pay | Admitting: Obstetrics and Gynecology

## 2017-05-20 ENCOUNTER — Other Ambulatory Visit (INDEPENDENT_AMBULATORY_CARE_PROVIDER_SITE_OTHER): Payer: PPO

## 2017-05-20 ENCOUNTER — Ambulatory Visit (INDEPENDENT_AMBULATORY_CARE_PROVIDER_SITE_OTHER): Payer: PPO

## 2017-05-20 VITALS — BP 110/66 | HR 70 | Ht 61.0 in | Wt 146.0 lb

## 2017-05-20 DIAGNOSIS — N950 Postmenopausal bleeding: Secondary | ICD-10-CM | POA: Diagnosis not present

## 2017-05-20 DIAGNOSIS — Z01818 Encounter for other preprocedural examination: Secondary | ICD-10-CM | POA: Diagnosis not present

## 2017-05-20 DIAGNOSIS — N924 Excessive bleeding in the premenopausal period: Secondary | ICD-10-CM

## 2017-05-20 LAB — BASIC METABOLIC PANEL
BUN: 9 mg/dL (ref 6–23)
CALCIUM: 9 mg/dL (ref 8.4–10.5)
CO2: 27 mEq/L (ref 19–32)
Chloride: 106 mEq/L (ref 96–112)
Creatinine, Ser: 0.83 mg/dL (ref 0.40–1.20)
GFR: 76.93 mL/min (ref >60.00)
GLUCOSE: 89 mg/dL (ref 70–99)
POTASSIUM: 3.9 meq/L (ref 3.5–5.1)
SODIUM: 138 meq/L (ref 135–145)

## 2017-05-20 LAB — IGP, APTIMA HPV
HPV APTIMA: NEGATIVE
PAP SMEAR COMMENT: 0

## 2017-05-20 LAB — CBC
HCT: 40.5 % (ref 36.0–46.0)
HEMOGLOBIN: 13.5 g/dL (ref 12.0–15.0)
MCHC: 33.2 g/dL (ref 30.0–36.0)
MCV: 93.5 fl (ref 78.0–100.0)
PLATELETS: 202 10*3/uL (ref 150.0–400.0)
RBC: 4.33 Mil/uL (ref 3.87–5.11)
RDW: 14.1 % (ref 11.5–15.5)
WBC: 9 10*3/uL (ref 4.0–10.5)

## 2017-05-20 LAB — HM PAP SMEAR: HM Pap smear: NEGATIVE

## 2017-05-21 NOTE — Progress Notes (Signed)
Gynecology Ultrasound Follow Up  Chief Complaint:  Chief Complaint  Patient presents with  . Medication follow up    Provera/bleeding     History of Present Illness: Patient is a 51 y.o. female who presents today for ultrasound evaluation of postmenopausal bleeding .  Ultrasound demonstrates the following findgins Adnexa: no adnexal masses Uterus: Non-enlarged uterus with endometrial stripe 93mm endometrial stripe Additional: no free fluid  Review of Systems: Review of Systems  Constitutional: Negative for chills and fever.  Gastrointestinal: Negative for abdominal pain, constipation, diarrhea, nausea and vomiting.    Past Medical History:  Past Medical History:  Diagnosis Date  . Anxiety   . Chronic back pain   . Degenerative joint disease   . Depression   . Hypotension   . IBS (irritable bowel syndrome) 05/2015  . MVP (mitral valve prolapse)     Past Surgical History:  Past Surgical History:  Procedure Laterality Date  . ANKLE SURGERY Right 03/02/2014   repair of tendon and sural nerve right ankle  . CERVICAL SPINE SURGERY  02/21/2016   arthrodesis of anterior cervical spine (fusion of C5-6,C6-7 and insertion of bone allograft  . CESAREAN SECTION    . CESAREAN SECTION WITH BILATERAL TUBAL LIGATION  1990  . COLONOSCOPY  04/19/2015  . HEMORRHOID SURGERY N/A 06/16/2015   Procedure: HEMORRHOIDECTOMY;  Surgeon: Leonie Green, MD;  Location: [facility] ORS;  Service: General;  Laterality: N/A;  . HYSTEROSCOPY W/D&C  12/24/2013    Gynecologic History:  Patient's last menstrual period was 05/05/2017 (exact date). Last Pap: 05/15/2017 Results were: .no abnormalities  Family History:  Family History  Problem Relation Age of Onset  . Arthritis Mother   . Depression Mother   . Hyperlipidemia Mother   . Hypertension Mother   . Kidney disease Mother   . Vision loss Mother   . Hypothyroidism Mother   . Hearing loss Father   . Hyperlipidemia Father   . Hypertension  Father   . Diabetes Mellitus I Maternal Grandmother   . Stroke Maternal Grandmother   . Congestive Heart Failure Maternal Grandfather   . Heart attack Maternal Grandfather   . Prostate cancer Paternal Grandfather   . Thyroid disease Maternal Aunt   . Diabetes Maternal Aunt   . Gout Maternal Aunt     Social History:  Social History   Social History  . Marital status: Married    Spouse name: N/A  . Number of children: 2  . Years of education: N/A   Occupational History  . disabled    Social History Main Topics  . Smoking status: Current Every Day Smoker    Packs/day: 0.10    Years: 37.00    Types: Cigarettes  . Smokeless tobacco: Never Used  . Alcohol use 0.6 oz/week    1 Cans of beer per week     Comment: drinks an occasional beer  . Drug use: No  . Sexual activity: Yes    Birth control/ protection: None, Surgical     Comment: tubal ligation   Other Topics Concern  . Not on file   Social History Narrative   Married.   2 children. One boy and one girl.   On disability.   Playing with her dogs, gardening.       Allergies:  Allergies  Allergen Reactions  . No Known Allergies     Medications: Prior to Admission medications   Medication Sig Start Date End Date Taking? Authorizing Provider  baclofen (LIORESAL)  10 MG tablet Limit  2 - 4 tablets by mouth per day if tolerated.   NO ZANAFLEX (TIZANIDINE) 07/08/16   Mohammed Kindle, MD  Cyanocobalamin (VITAMIN B-12) 5000 MCG TBDP Take 2,500 mcg by mouth daily.     [provider]  FLUoxetine (PROZAC) 20 MG capsule TAKE THREE CAPSULES BY MOUTH ONCE DAILY 12/03/16   Pleas Koch, NP  HYDROcodone-acetaminophen (NORCO/VICODIN) 5-325 MG tablet Limit 1 tab by mouth per day or twice per day if tolerated Patient not taking: Reported on 01/30/2017 07/08/16   Mohammed Kindle, MD  Omega-3 Fatty Acids (FISH OIL) 1000 MG CAPS Take 1,000 mg by mouth daily. 09/27/16   [provider]  traZODone (DESYREL) 100 MG  tablet Take 3 tablets (300 mg total) by mouth at bedtime. 03/28/17   Pleas Koch, NP    Physical Exam Vitals: Blood pressure 110/66, pulse 70, height 5\' 1"  (1.549 m), weight 146 lb (66.2 kg), last menstrual period 05/05/2017.  General: NAD HEENT: normocephalic, anicteric Pulmonary: No increased work of breathing GU: Normal external female genitalia, normal appearing cervix, uterus non-enlarged, no palpable uterine or adnexal masses Extremities: no edema, erythema, or tenderness Neurologic: Grossly intact, normal gait Psychiatric: mood appropriate, affect full   ENDOMETRIAL BIOPSY     The indications for endometrial biopsy were reviewed.   Risks of the biopsy including cramping, bleeding, infection, uterine perforation, inadequate specimen and need for additional procedures  were discussed. The patient states she understands and agrees to undergo procedure today. Consent was signed. Time out was performed. Urine HCG was negative. A Graves speculum was placed and the cervix was brought into view.  The cervix was prepped with Betadine. A single-toothed tenaculum was placed on the anterior lip of the cervix for traction. A 3 mm pipelle was introduced through the cervix into the endometrial cavity without difficulty to a depth of 7cm, and a small amount of tissue was obtained, the resulting specime sent to pathology. The instruments were removed from the patient's vagina. Minimal bleeding from the cervix was noted. The patient tolerated the procedure well. Routine post-procedure instructions were given to the patient.  She will be contacted by phone one results become available.     Malachy Mood, MD, FACOG [facility], [facility]  Assessment: 51 y.o. [phone] postmenopausal bleeding  Plan: Problem List Items Addressed This Visit    None    Visit Diagnoses    Postmenopausal bleeding    -  Primary   Relevant Orders   Pathology Report      1) We discussed that menopause  is a clinical diagnosis made after 12 months of amenorrhea.  The average age of menopause in the  [place] population is 25 but there may be significant variation.  Any bleeding that happens after a 12 month period of amenorrhea warrants further work.  Possible etiologies of postmenopausal bleeding were discussed with the patient today.  These may range from benign etiologies such as urethral prolapse and atrophy, to indeterminate lesions such as submucosal fibroids or polyps which would require resection to accurately evaluate. The role of unopposed estrogen in the development of endometrial hyperplasia or carcinoma is discussed.  The risk of endometrial hyperplasia is linearly correlated with increasing BMI given the production of estrone by adipose tissue.  Work up includes transvaginal ultrasound to assess the thickness of the endometrial lining as well as to assess for focal uterine lesions.  Negative ultrasound evaluation, defined as the absence of focal lesions and  endometrial stripe of <19mm, effectively rules out carcinoma and confirms atrophy as the most likely etiology.  Should focal lesions be present these generally require hysteroscopic resection.  Should lining be greater >60mm endometrial biopsy is warranted to rule out hyperplasia or frank endometrial cancer.  Continued episodes of bleeding despite negative ultrasound also warrant endometrial sampling.  As the cervical pathology may also be implicated in postmenopausal bleeding prior cervical cytology was reviewed and repeated if required per ASCCP guidelines.   - Ultrasound reviewed today - endometrial biopsy obtained today, patient will be contacted with results.  If proliferative phase endometrium considering restarting premarin, if inactive discontinue provera - Pap normal within the last month  A total of 15 minutes were spent in face-to-face contact with the patient during this encounter with over half of that time devoted to counseling  and coordination of care.

## 2017-05-22 LAB — PATHOLOGY

## 2017-05-23 ENCOUNTER — Encounter: Payer: Self-pay | Admitting: Obstetrics and Gynecology

## 2017-05-27 ENCOUNTER — Encounter: Payer: Self-pay | Admitting: Primary Care

## 2017-05-27 DIAGNOSIS — M791 Myalgia: Secondary | ICD-10-CM | POA: Diagnosis not present

## 2017-05-27 DIAGNOSIS — M47817 Spondylosis without myelopathy or radiculopathy, lumbosacral region: Secondary | ICD-10-CM | POA: Diagnosis not present

## 2017-05-27 DIAGNOSIS — M533 Sacrococcygeal disorders, not elsewhere classified: Secondary | ICD-10-CM | POA: Diagnosis not present

## 2017-05-27 DIAGNOSIS — M5416 Radiculopathy, lumbar region: Secondary | ICD-10-CM | POA: Diagnosis not present

## 2017-05-28 DIAGNOSIS — F319 Bipolar disorder, unspecified: Secondary | ICD-10-CM | POA: Diagnosis not present

## 2017-05-28 DIAGNOSIS — E785 Hyperlipidemia, unspecified: Secondary | ICD-10-CM | POA: Diagnosis not present

## 2017-05-28 DIAGNOSIS — M2578 Osteophyte, vertebrae: Secondary | ICD-10-CM | POA: Diagnosis not present

## 2017-05-28 DIAGNOSIS — M4802 Spinal stenosis, cervical region: Secondary | ICD-10-CM | POA: Diagnosis not present

## 2017-05-28 DIAGNOSIS — F419 Anxiety disorder, unspecified: Secondary | ICD-10-CM | POA: Diagnosis not present

## 2017-05-28 DIAGNOSIS — G4700 Insomnia, unspecified: Secondary | ICD-10-CM | POA: Diagnosis not present

## 2017-05-28 DIAGNOSIS — K219 Gastro-esophageal reflux disease without esophagitis: Secondary | ICD-10-CM | POA: Diagnosis not present

## 2017-05-28 DIAGNOSIS — Z981 Arthrodesis status: Secondary | ICD-10-CM | POA: Diagnosis not present

## 2017-05-28 DIAGNOSIS — M960 Pseudarthrosis after fusion or arthrodesis: Secondary | ICD-10-CM | POA: Diagnosis not present

## 2017-05-28 DIAGNOSIS — M50121 Cervical disc disorder at C4-C5 level with radiculopathy: Secondary | ICD-10-CM | POA: Diagnosis not present

## 2017-05-28 DIAGNOSIS — M4312 Spondylolisthesis, cervical region: Secondary | ICD-10-CM | POA: Diagnosis not present

## 2017-05-28 DIAGNOSIS — F17210 Nicotine dependence, cigarettes, uncomplicated: Secondary | ICD-10-CM | POA: Diagnosis not present

## 2017-05-28 DIAGNOSIS — F329 Major depressive disorder, single episode, unspecified: Secondary | ICD-10-CM | POA: Diagnosis not present

## 2017-05-28 DIAGNOSIS — K5909 Other constipation: Secondary | ICD-10-CM | POA: Diagnosis not present

## 2017-05-28 DIAGNOSIS — M542 Cervicalgia: Secondary | ICD-10-CM | POA: Diagnosis not present

## 2017-06-12 ENCOUNTER — Telehealth: Payer: Self-pay | Admitting: Primary Care

## 2017-06-12 NOTE — Telephone Encounter (Signed)
Pt would like me to call her tomorrow to schedule AWV.  Started Medicare 1+ years ago; pt can see Lesia.

## 2017-06-18 NOTE — Telephone Encounter (Signed)
Left pt message asking to call Ebony Hail back directly at [phone] to schedule AWV + labs with Katha Cabal and CPE with PCP.  *NOTE* Never had AWV before  **Started Medicare 1+ years ago; pt can see Lesia.

## 2017-06-26 ENCOUNTER — Other Ambulatory Visit: Payer: Self-pay | Admitting: Primary Care

## 2017-06-30 ENCOUNTER — Encounter: Payer: Self-pay | Admitting: Primary Care

## 2017-07-08 ENCOUNTER — Ambulatory Visit: Payer: Self-pay | Admitting: Primary Care

## 2017-07-09 ENCOUNTER — Ambulatory Visit: Payer: Self-pay | Admitting: Primary Care

## 2017-07-18 DIAGNOSIS — M5020 Other cervical disc displacement, unspecified cervical region: Secondary | ICD-10-CM | POA: Diagnosis not present

## 2017-07-25 DIAGNOSIS — M5416 Radiculopathy, lumbar region: Secondary | ICD-10-CM | POA: Diagnosis not present

## 2017-07-25 DIAGNOSIS — F17210 Nicotine dependence, cigarettes, uncomplicated: Secondary | ICD-10-CM | POA: Diagnosis not present

## 2017-07-25 DIAGNOSIS — Z716 Tobacco abuse counseling: Secondary | ICD-10-CM | POA: Diagnosis not present

## 2017-07-29 DIAGNOSIS — G894 Chronic pain syndrome: Secondary | ICD-10-CM | POA: Diagnosis not present

## 2017-07-29 DIAGNOSIS — M5416 Radiculopathy, lumbar region: Secondary | ICD-10-CM | POA: Diagnosis not present

## 2017-07-29 DIAGNOSIS — M791 Myalgia, unspecified site: Secondary | ICD-10-CM | POA: Diagnosis not present

## 2017-07-29 DIAGNOSIS — Z79891 Long term (current) use of opiate analgesic: Secondary | ICD-10-CM | POA: Diagnosis not present

## 2017-07-29 DIAGNOSIS — Z5181 Encounter for therapeutic drug level monitoring: Secondary | ICD-10-CM | POA: Diagnosis not present

## 2017-07-29 DIAGNOSIS — M545 Low back pain: Secondary | ICD-10-CM | POA: Diagnosis not present

## 2017-07-29 DIAGNOSIS — M47817 Spondylosis without myelopathy or radiculopathy, lumbosacral region: Secondary | ICD-10-CM | POA: Diagnosis not present

## 2017-07-31 DIAGNOSIS — M5416 Radiculopathy, lumbar region: Secondary | ICD-10-CM | POA: Diagnosis not present

## 2017-08-01 ENCOUNTER — Encounter: Payer: Self-pay | Admitting: Primary Care

## 2017-08-22 ENCOUNTER — Encounter: Payer: Self-pay | Admitting: Primary Care

## 2017-08-26 ENCOUNTER — Telehealth: Payer: Self-pay | Admitting: *Deleted

## 2017-08-26 DIAGNOSIS — M5440 Lumbago with sciatica, unspecified side: Principal | ICD-10-CM

## 2017-08-26 DIAGNOSIS — G8929 Other chronic pain: Secondary | ICD-10-CM

## 2017-08-26 NOTE — Telephone Encounter (Signed)
Spoken to patient. She stated that she wanted to see pain management for her back pain.  She see Dr Noemi Chapel who have been giving injection but no relief.   Dr Primus Bravo notified patient that she would need her primary provider to referral her to [place] [facility]

## 2017-08-26 NOTE — Telephone Encounter (Signed)
Copied from [facility] #[id number]. Topic: Referral - Request >> Aug 26, 2017  2:36 PM Conception Chancy, NT wrote: Reason for CRM: pt would like [facility] to refer her to [place] [facility].

## 2017-08-27 NOTE — Telephone Encounter (Signed)
Referral to Dr. Primus Bravo placed

## 2017-08-27 NOTE — Addendum Note (Signed)
Addended by: Jearld Fenton on: 08/27/2017 11:11 AM   Modules accepted: Orders

## 2017-08-27 NOTE — Telephone Encounter (Signed)
Patient was notified that referral was placed to [facility] pain manafement

## 2017-09-01 ENCOUNTER — Telehealth: Payer: Self-pay | Admitting: Primary Care

## 2017-09-01 NOTE — Telephone Encounter (Signed)
Copied from [place] #[id number]. Topic: Referral - Status >> Sep 01, 2017 10:54 AM Neva Seat wrote: Karen Edwards a referral [facility] pain management

## 2017-09-01 NOTE — Telephone Encounter (Signed)
Called the patient and sent Pain Referral to  Pain clinic for patient to see Dr Cleda Mccreedy. Patient notified that they would call her to schedule.

## 2017-09-04 DIAGNOSIS — G894 Chronic pain syndrome: Secondary | ICD-10-CM | POA: Insufficient documentation

## 2017-09-04 DIAGNOSIS — Z79891 Long term (current) use of opiate analgesic: Secondary | ICD-10-CM | POA: Insufficient documentation

## 2017-09-04 DIAGNOSIS — M899 Disorder of bone, unspecified: Secondary | ICD-10-CM | POA: Insufficient documentation

## 2017-09-04 DIAGNOSIS — M79601 Pain in right arm: Secondary | ICD-10-CM

## 2017-09-04 DIAGNOSIS — G8929 Other chronic pain: Secondary | ICD-10-CM | POA: Insufficient documentation

## 2017-09-04 DIAGNOSIS — Z789 Other specified health status: Secondary | ICD-10-CM | POA: Insufficient documentation

## 2017-09-04 DIAGNOSIS — K219 Gastro-esophageal reflux disease without esophagitis: Secondary | ICD-10-CM | POA: Insufficient documentation

## 2017-09-04 DIAGNOSIS — M47816 Spondylosis without myelopathy or radiculopathy, lumbar region: Secondary | ICD-10-CM | POA: Insufficient documentation

## 2017-09-04 DIAGNOSIS — M533 Sacrococcygeal disorders, not elsewhere classified: Secondary | ICD-10-CM

## 2017-09-04 DIAGNOSIS — M5412 Radiculopathy, cervical region: Secondary | ICD-10-CM

## 2017-09-04 DIAGNOSIS — F1190 Opioid use, unspecified, uncomplicated: Secondary | ICD-10-CM | POA: Insufficient documentation

## 2017-09-04 DIAGNOSIS — M5126 Other intervertebral disc displacement, lumbar region: Secondary | ICD-10-CM | POA: Insufficient documentation

## 2017-09-04 DIAGNOSIS — M4802 Spinal stenosis, cervical region: Secondary | ICD-10-CM | POA: Insufficient documentation

## 2017-09-04 DIAGNOSIS — M79602 Pain in left arm: Secondary | ICD-10-CM

## 2017-09-04 DIAGNOSIS — M5416 Radiculopathy, lumbar region: Secondary | ICD-10-CM

## 2017-09-04 NOTE — Progress Notes (Signed)
Patient's Name: Karen Edwards  MRN: [id number]  Referring Provider: Jearld Fenton, NP  DOB: 05/10/1966  PCP: Pleas Koch, NP  DOS: 09/08/2017  Note by: Gaspar Cola, MD  Service setting: Ambulatory outpatient  Specialty: Interventional Pain Management  Location: [facility] (AMB) Pain Management Facility  Visit type: Initial Patient Evaluation  Patient type: New Patient   Primary Reason(s) for Visit: Encounter for initial evaluation of one or more chronic problems (new to examiner) potentially causing chronic pain, and posing a threat to normal musculoskeletal function. (Level of risk: High) CC: Back Pain (lower)  HPI  Karen Edwards is a 51 y.o. year old, female patient, who comes today to see [place] for the first time for an initial evaluation of her chronic pain. She has Chronic low back pain (Primary Area of Pain) (Bilateral) (R>L); Menorrhagia; Fatigue; DDD (degenerative disc disease), lumbar; DDD (degenerative disc disease), cervical; Chronic occipital neuralgia (Fifth Area of Pain) (Bilateral) (R>L); Lumbar facet syndrome (Bilateral) (L>R); DDD (degenerative disc disease), lumbosacral; Lumbar radicular pain (Right) (L4); Sacroiliac joint dysfunction (Bilateral); Depression; Rectal bleeding; Insomnia; Chronic constipation; Pharmacologic therapy; Hyperlipidemia; Chronic neck pain (Fourth Area of Pain) (Bilateral) (R>L); GERD (gastroesophageal reflux disease); Numbness and tingling of upper extremity (Right); Chronic upper extremity pain (Bilateral) (R>L); Chronic sacroiliac joint pain (Bilateral) (L>R); Lumbar facet hypertrophy (Bilateral); L1-2 disc extrusion (Right); Cervical foraminal stenosis (C5-6) (Bilateral); Long term prescription opiate use; Opiate use; Disorder of skeletal system; Problems influencing health status; Chronic pain syndrome; Chronic cervical radicular pain (Right: C6/C7) (Left: C5/T1); Chronic lumbar radicular pain; Chronic hip pain (Tertiary Area of Pain) (Bilateral)  (R>L); Chronic lower extremity pain (Secondary Area of Pain) (Bilateral) (R>L); and Chronic shoulder pain (Bilateral) (L>R) on their problem list. Today she comes in for evaluation of her Back Pain (lower)  Pain Assessment: Location: Lower Back Radiating: right to just below the knee Onset: More than a month ago Duration: Chronic pain Quality: Burning, Stabbing, Aching Severity: 7 /10 (self-reported pain score)  Note: Reported level is compatible with observation.                         When using our objective Pain Scale, levels between 6 and 10/10 are said to belong in an emergency room, as it progressively worsens from a 6/10, described as severely limiting, requiring emergency care not usually available at an outpatient pain management facility. At a 6/10 level, communication becomes difficult and requires great effort. Assistance to reach the emergency department may be required. Facial flushing and profuse sweating along with potentially dangerous increases in heart rate and blood pressure will be evident. Effect on ADL: unable to do many daily activities Timing: Constant Modifying factors: changing positions  Onset and Duration: Present longer than 3 months Cause of pain: Unknown Severity: Getting worse, No change since onset, NAS-11 at its worse: 8/10, NAS-11 at its best: 6/10, NAS-11 now: 7/10 and NAS-11 on the average: 6/10 Timing: Not influenced by the time of the day Aggravating Factors: Bending, Climbing, Kneeling, Lifiting, Motion, Prolonged sitting, Prolonged standing, Squatting, Stooping , Twisting, Walking, Walking uphill, Walking downhill and Working Alleviating Factors: Lying down, Medications, Nerve blocks, Resting, Sitting and Sleeping Associated Problems: Constipation, Depression, Dizziness, Fatigue, Nausea, Numbness, Sadness, Sweating, Swelling, Tingling, Weakness, Pain that wakes patient up and Pain that does not allow patient to sleep Quality of Pain: Aching, Annoying,  Burning, Deep, Disabling, Itching, Nagging, Pressure-like, Sharp, Shooting, Stabbing, Throbbing and Tingling Previous Examinations or Tests: CT  scan, MRI scan, Nerve block and X-rays Previous Treatments: Facet blocks  The patient comes into the clinics today for the first time for a chronic pain management evaluation.  According to the patient the primary area of pain is that of the lower back, bilaterally, with the right side being worse than the left.  The patient denies any prior surgeries but does admit to having had a relatively recent MRI.  She does admit to having had some nerve blocks done by Dr. Primus Bravo, some of which helped in some that did not.  The patient does admit having had some physical therapy years ago which did not help and in fact it made pain worse.  The second area of pain is described to be that of the lower extremities, bilaterally, with the right being worse than the left.  In the case of the right lower extremity the pain goes down through the lateral aspect of the leg to the area below the knee.  She denies any physical therapy, surgery, x-rays, or nerve conduction test.  In the case of the left lower extremity pain goes down through the posterolateral aspect of the leg to just below the buttocks.  Again she denies any surgery, physical therapy, x-rays, or nerve conduction test.  At the next area pain is that of the hips, bilaterally, with the right being worse than the left.  She denies any hip surgery, x-rays, physical therapy, or nerve blocks.  She also denies any joint injections in the area.  The next area of pain is described to be that of the neck, through the posterior aspect of the neck, bilaterally, with the right side being worse than the left.  She does admit to having had cervical spine surgery x2 with the first 1 having been in April 2017 followed by a second 1 around May 28, 2017, both of them done at [facility] neurosurgery by Dr. Sarajane Jews Haglund.   The patient denies any complications and does admit to having had some physical therapy.  She denies any prior nerve blocks or joint injections in the area and she does admit to having had some imaging studies recently, just before the surgery.  The next area of pain is described to be the headaches in the occipital region with headaches that are bilateral with the right being worse than the left.  She denies any surgeries, nerve blocks, joint injections, x-rays, or physical therapy for that pain.  Next is the shoulder pain which is bilateral, with the left being worse than the right.  She also admits to having pain between the shoulder blades bilaterally also with the left being worse than the right.  She denies any surgeries, nerve blocks, joint injections, x-rays, or physical therapy for that area.  The next area of pain is described to be that of the upper extremities, bilaterally, with the right being worse than the left.  In the case of the right upper extremity the pain goes down to her thumb, index finger, and middle fingers with some weakness where she drops things.  This seems to follow a C6/C7 dermatomal distribution.  In the case of the left upper extremity, she complains primarily of a tingling sensation that does not reach to hand.  Finally, she complains of bilateral feet pain, worse on the right when compared to the left.  She describes this as a burning sensation but she denies any diabetes, nerve conduction test, or neuropathies.  The pain is described to go  to the top of the foot, bilaterally, and what seems to be an L5 dermatomal distribution.  Physical exam today was positive for bilateral lumbar facet pain with the right being worse than the left, upon provocative hyperextension and rotation of the lumbar spine.  In addition, the patient had a positive Patrick maneuver test, bilaterally, with pain being referred towards the hip and the SI joint, bilaterally, with the right being worse  than the left in both instances.  Today I took the time to provide the patient with information regarding my pain practice. The patient was informed that my practice is divided into two sections: an interventional pain management section, as well as a completely separate and distinct medication management section. I explained that I have procedure days for my interventional therapies, and evaluation days for follow-ups and medication management. Because of the amount of documentation required during both, they are kept separated. This means that there is the possibility that she may be scheduled for a procedure on one day, and medication management the next. I have also informed her that because of staffing and facility limitations, I no longer take patients for medication management only. To illustrate the reasons for this, I gave the patient the example of surgeons, and how inappropriate it would be to refer a patient to his/her care, just to write for the post-surgical antibiotics on a surgery done by a different surgeon.   Because interventional pain management is my board-certified specialty, the patient was informed that joining my practice means that they are open to any and all interventional therapies. I made it clear that this does not mean that they will be forced to have any procedures done. What this means is that I believe interventional therapies to be essential part of the diagnosis and proper management of chronic pain conditions. Therefore, patients not interested in these interventional alternatives will be better served under the care of a different practitioner.  The patient was also made aware of my Comprehensive Pain Management Safety Guidelines where by joining my practice, they limit all of their nerve blocks and joint injections to those done by our practice, for as long as we are retained to manage their care.   Historic Controlled Substance Pharmacotherapy Review  PMP and  historical list of controlled substances: Hydrocodone/APAP 5/325; oxycodone IR 5 mg; Ambien 5 mg; diazepam 5 mg. Highest opioid analgesic regimen found: Oxycodone IR 5 mg 2 tablets p.o. 5 times a day (55.5 mg/day of oxycodone) (83.33 MME) Most recent opioid analgesic: Hydrocodone/APAP 5/325 1 tablet p.o. twice daily (written on July 29, 2017 and failed on 08/25/2017) Current opioid analgesics: Hydrocodone/APAP 5/325 1 tablet p.o. twice daily Highest recorded MME/day: 83.33 mg/day MME/day: 10 mg/day Medications: The patient did not bring the medication(s) to the appointment, as requested in our "New Patient Package" Pharmacodynamics: Desired effects: Analgesia: The patient reports >50% benefit. Reported improvement in function: The patient reports medication allows her to accomplish basic ADLs. Clinically meaningful improvement in function (CMIF): Sustained CMIF goals met Perceived effectiveness: Described as relatively effective, allowing for increase in activities of daily living (ADL) Undesirable effects: Side-effects or Adverse reactions: None reported Historical Monitoring: The patient  reports that she does not use drugs. List of all UDS Test(s): Lab Results  Component Value Date   MDMA NONE DETECTED 01/30/2017   COCAINSCRNUR NONE DETECTED 01/30/2017   PCPSCRNUR NONE DETECTED 01/30/2017   THCU NONE DETECTED 01/30/2017   List of other Serum/Urine Drug Screening Test(s):  Lab Results  Component Value Date   COCAINSCRNUR NONE DETECTED 01/30/2017   THCU NONE DETECTED 01/30/2017   Historical Background Evaluation: [place] PMP: Six (6) year initial data search conducted.             PMP NARX Score Report:  Narcotic: 341 Sedative: 160 Stimulant: 000 [place] Department of public safety, offender search: Editor, commissioning Information) Non-contributory Risk Assessment Profile: Aberrant behavior: None observed or detected today Risk factors for fatal opioid overdose: None identified today PMP NARX  Overdose Risk Score: 130 Fatal overdose hazard ratio (HR): Calculation deferred Non-fatal overdose hazard ratio (HR): Calculation deferred Risk of opioid abuse or dependence: 0.7-3.0% with doses ? 36 MME/day and 6.1-26% with doses ? 120 MME/day. Substance use disorder (SUD) risk level: Pending results of Medical Psychology Evaluation for SUD Opioid risk tool (ORT) (Total Score): 0 Opioid Risk Tool - 09/08/17 1200      Family History of Substance Abuse   Alcohol  Negative    Illegal Drugs  Negative    Rx Drugs  Negative      Personal History of Substance Abuse   Alcohol  Negative    Illegal Drugs  Negative    Rx Drugs  Negative      Age   Age between 40-45 years   No      History of Preadolescent Sexual Abuse   History of Preadolescent Sexual Abuse  Negative or Female      Psychological Disease   Psychological Disease  Negative    Depression  Negative      Total Score   Opioid Risk Tool Scoring  0    Opioid Risk Interpretation  Low Risk      ORT Scoring interpretation table:  Score <3 = Low Risk for SUD  Score between 4-7 = Moderate Risk for SUD  Score >8 = High Risk for Opioid Abuse   PHQ-2 Depression Scale:  Total score: 0  PHQ-2 Scoring interpretation table: (Score and probability of major depressive disorder)  Score 0 = No depression  Score 1 = 15.4% Probability  Score 2 = 21.1% Probability  Score 3 = 38.4% Probability  Score 4 = 45.5% Probability  Score 5 = 56.4% Probability  Score 6 = 78.6% Probability   PHQ-9 Depression Scale:  Total score: 0  PHQ-9 Scoring interpretation table:  Score 0-4 = No depression  Score 5-9 = Mild depression  Score 10-14 = Moderate depression  Score 15-19 = Moderately severe depression  Score 20-27 = Severe depression (2.4 times higher risk of SUD and 2.89 times higher risk of overuse)   Pharmacologic Plan: Pending ordered tests and/or consults            Initial impression: Pending review of available data and ordered  tests.  Meds   Current Outpatient Medications:  .  Cyanocobalamin (VITAMIN B-12) 5000 MCG TBDP, Take 2,500 mcg by mouth daily. , Disp: , Rfl:  .  FLUoxetine (PROZAC) 20 MG capsule, TAKE THREE CAPSULES BY MOUTH ONCE DAILY, Disp: 270 capsule, Rfl: 0 .  HYDROcodone-acetaminophen (NORCO/VICODIN) 5-325 MG tablet, Limit 1 tab by mouth per day or twice per day if tolerated, Disp: 60 tablet, Rfl: 0 .  tiZANidine (ZANAFLEX) 4 MG capsule, Take 4 mg 3 (three) times daily as needed by mouth for muscle spasms., Disp: , Rfl:  .  traZODone (DESYREL) 100 MG tablet, Take 3 tablets (300 mg total) by mouth at bedtime., Disp: 270 tablet, Rfl: 1  Imaging Review  Cervical Imaging: Cervical MR wo contrast:  Results for orders placed during the hospital encounter of 10/16/15  MR Cervical Spine Wo Contrast   Narrative CLINICAL DATA:  Neck pain radiating to both shoulders with hand numbness.  EXAM: MRI CERVICAL SPINE WITHOUT CONTRAST  TECHNIQUE: Multiplanar, multisequence MR imaging of the cervical spine was performed. No intravenous contrast was administered.  COMPARISON:  MRI 10/13/2013.  CT 03/23/2014.  FINDINGS: The study suffers from some motion degradation.  There is no abnormality at the foramen magnum, C1-2, C2-3 or C3-4.  C4-5: Disc degeneration with shallow disc protrusion that indents the thecal sac but does not appear to compress the cord or show foraminal extension.  C5-6: Spondylosis with endplate osteophytes and bulging of the disc. Narrowing of the subarachnoid space but no compression of the cord. Moderate foraminal narrowing, right more than left.  C6-7: Spondylosis with endplate osteophytes and shallow broad-based herniation of disc material. Effacement of the ventral subarachnoid space with AP diameter of the canal only 8 mm. No foraminal narrowing.  C7-T1:  Normal interspace.  IMPRESSION: Motion degraded study.  C4-5: Shallow disc protrusion that indents the thecal sac  but does not appear to cause neural compression.  C5-6: Spondylosis with narrowing of the ventral subarachnoid space and bilateral neural foraminal stenosis that could affect either or both C6 nerve roots.  C6-7: Broad-based disc herniation narrows the spinal canal but does not compress the cord or show foraminal extension.  C5-6 and C6-7 appear similar to the previous studies. C4-5 appears to have worsened over time.   Electronically Signed   By: Nelson Chimes M.D.   On: 10/16/2015 14:23    Thoracic Imaging: Thoracic DG w/swimmers view:  Results for orders placed in visit on 05/05/17  [facility] Thoracic Spine W/Swimmers   Narrative CLINICAL DATA:  Chronic right chest wall pain.  No known injury.  EXAM: THORACIC SPINE - 3 VIEWS  COMPARISON:  Chest x-ray 01/30/2017  FINDINGS: There is no evidence of thoracic spine fracture. Alignment is normal. No other significant bone abnormalities are identified.  IMPRESSION: Negative.   Electronically Signed   By: Rolm Baptise M.D.   On: 05/06/2017 08:12    Lumbosacral Imaging: Lumbar MR wo contrast:  Results for orders placed during the hospital encounter of 02/20/16  MR Lumbar Spine Wo Contrast   Narrative CLINICAL DATA:  Low back pain with bilateral hip and leg pain, worse on the right. Symptoms for 2 years, worsening over the last 6 months. No acute injury or prior relevant surgery.  EXAM: MRI LUMBAR SPINE WITHOUT CONTRAST  TECHNIQUE: Multiplanar, multisequence MR imaging of the lumbar spine was performed. No intravenous contrast was administered.  COMPARISON:  Radiographs 03/23/2014.  Lumbar MRI 09/29/2014.  FINDINGS: Segmentation: Conventional anatomy assumed, with the last open disc space designated L5-S1.  Alignment:  Normal.  Bones: No worrisome osseous lesion, acute fracture or pars defect. The visualized sacroiliac joints appear unremarkable.  Conus medullaris: Extends to the upper L1 level and appears  normal.  Paraspinal and other soft tissues: No significant paraspinal findings.  Disc levels:  L1-2: Stable small right paracentral disc extrusion with mild cephalad extension of disc material behind the L1 vertebral body. No mass effect on the conus medullaris or exiting nerve roots.  L2-3: Mild disc bulging. No spinal stenosis or nerve root encroachment.  L3-4: Mild disc bulging and facet hypertrophy. No spinal stenosis or nerve root encroachment.  L4-5: Mild disc bulging and facet hypertrophy. No spinal stenosis or nerve root encroachment.  L5-S1: Disc height and hydration  are maintained. Mild bilateral facet hypertrophy. No spinal stenosis or nerve root encroachment.  IMPRESSION: 1. Stable mild degenerative changes throughout the lumbar spine with disc bulging and facet hypertrophy. 2. Stable small right paracentral disc extrusion at L1-2 without resulting nerve root encroachment. 3. No acute findings.   Electronically Signed   By: Richardean Sale M.D.   On: 02/20/2016 10:44    Lumbar MR w/wo contrast:  Results for orders placed during the hospital encounter of 12/03/12  MR Lumbar Spine W Wo Contrast   Narrative *RADIOLOGY REPORT*  Clinical Data: Chronic low back pain with left sided radicular symptoms.  No reported relevant prior surgery.  MRI LUMBAR SPINE WITHOUT AND WITH CONTRAST  Technique:  Multiplanar and multiecho pulse sequences of the lumbar spine were obtained without and with intravenous contrast.  Contrast: 63m MULTIHANCE GADOBENATE DIMEGLUMINE 529 MG/ML IV SOLN  Comparison: Lumbar spine radiographs 10/22/2012.  Findings: Radiographs demonstrate five lumbar type vertebral bodies.  The alignment is normal.  There is no evidence of fracture or pars defect.  Scattered tiny hemangiomas are noted.  The conus medullaris extends to the T12-L1 level and appears normal. There are no paraspinal abnormalities. Sagittal images demonstrate probable  retroversion of the uterus.  There is a small subserosal uterine fibroid as well as central low density in the uterus which may reflect a larger fibroid.  This is only partially imaged on the current examination.  L1-L2:  Small right paracentral disc protrusion.  No spinal stenosis or nerve root encroachment.  L2-L3:  Mild disc bulging.  No spinal stenosis or nerve root encroachment.  L3-L4:  Disc height and hydration are maintained.  There is mild facet and ligamentous hypertrophy.  No spinal stenosis or nerve root encroachment.  L4-L5:  Mild disc bulging with mild facet and ligamentous hypertrophy.  No spinal stenosis or nerve root encroachment.  L5-S1:  Disc height and hydration are maintained.  No spinal stenosis or nerve root encroachment.  IMPRESSION:  1.  Small right paracentral disc protrusion at L1-L2. 2.  Mild disc bulging and facet disease from L2-L3 through L4-L5. 3.  No significant spinal stenosis or nerve root encroachment. 4.  Suspected uterine fibroids, only partially imaged on the current examination.   Original Report Authenticated By: WRichardean Sale M.D.    Lumbar DG (Complete) 4+V:  Results for orders placed during the hospital encounter of 10/22/12  DG Lumbar Spine Complete   Narrative *RADIOLOGY REPORT*  Clinical Data: Back pain.  LUMBAR SPINE - COMPLETE 4+ VIEW  Comparison: None  Findings: The lateral film demonstrates normal alignment. Vertebral bodies and disc spaces are maintained.  No acute bony findings.  Normal alignment of the facet joints and no pars defects.  The visualized bony pelvis in intact.  IMPRESSION: Normal alignment and no acute bony findings or degenerative changes.   Original Report Authenticated By: PMarijo Sanes M.D.    Complexity Note: Imaging results reviewed. Results shared with Ms. STye Edwards using [employer]                         ROS  Cardiovascular History: No reported cardiovascular signs or  symptoms such as High blood pressure, coronary artery disease, abnormal heart rate or rhythm, heart attack, blood thinner therapy or heart weakness and/or failure Pulmonary or Respiratory History: Smoking and Coughing up mucus (Bronchitis) Neurological History: No reported neurological signs or symptoms such as seizures, abnormal skin sensations, urinary and/or fecal incontinence, being born with an abnormal  open spine and/or a tethered spinal cord Review of Past Neurological Studies:  Results for orders placed or performed during the hospital encounter of 01/30/17  CT HEAD WO CONTRAST   Narrative   CLINICAL DATA:  Fall.  Altered mental status  EXAM: CT HEAD WITHOUT CONTRAST  TECHNIQUE: Contiguous axial images were obtained from the base of the skull through the vertex without intravenous contrast.  COMPARISON:  None.  FINDINGS: Brain: No evidence of acute infarction, hemorrhage, hydrocephalus, extra-axial collection or mass lesion/mass effect.  Vascular: No hyperdense vessel or unexpected calcification.  Skull: Negative  Sinuses/Orbits: Negative  Other: None  IMPRESSION: Negative CT head   Electronically Signed   By: Franchot Gallo M.D.   On: 01/30/2017 08:21    Psychological-Psychiatric History: Anxiousness, Depressed and Prone to panicking Gastrointestinal History: No reported gastrointestinal signs or symptoms such as vomiting or evacuating blood, reflux, heartburn, alternating episodes of diarrhea and constipation, inflamed or scarred liver, or pancreas or irrregular and/or infrequent bowel movements Genitourinary History: No reported renal or genitourinary signs or symptoms such as difficulty voiding or producing urine, peeing blood, non-functioning kidney, kidney stones, difficulty emptying the bladder, difficulty controlling the flow of urine, or chronic kidney disease Hematological History: Brusing easily Endocrine History: No reported endocrine signs or symptoms  such as high or low blood sugar, rapid heart rate due to high thyroid levels, obesity or weight gain due to slow thyroid or thyroid disease Rheumatologic History: No reported rheumatological signs and symptoms such as fatigue, joint pain, tenderness, swelling, redness, heat, stiffness, decreased range of motion, with or without associated rash Musculoskeletal History: Negative for myasthenia gravis, muscular dystrophy, multiple sclerosis or malignant hyperthermia Work History: Disabled  Allergies  Karen Edwards is allergic to no known allergies.  Laboratory Chemistry  Inflammation Markers (CRP: Acute Phase) (ESR: Chronic Phase) No results found for: CRP, ESRSEDRATE               Renal Function Markers Lab Results  Component Value Date   BUN 9 05/20/2017   CREATININE 0.83 05/20/2017   GFRAA >60 01/30/2017   GFRNONAA >60 01/30/2017                 Hepatic Function Markers Lab Results  Component Value Date   AST 20 01/30/2017   ALT 12 (L) 01/30/2017   ALBUMIN 4.0 01/30/2017   ALKPHOS 53 01/30/2017                 Electrolytes Lab Results  Component Value Date   NA 138 05/20/2017   K 3.9 05/20/2017   CL 106 05/20/2017   CALCIUM 9.0 05/20/2017                 Neuropathy Markers Lab Results  Component Value Date   VITAMINB12 >1500 (H) 08/13/2016                 Bone Pathology Markers Lab Results  Component Value Date   ALKPHOS 53 01/30/2017   VD25OH 43.74 01/10/2016   CALCIUM 9.0 05/20/2017                 Rheumatology Markers No results found for: [facility]              Coagulation Parameters Lab Results  Component Value Date   PLT 202.0 05/20/2017                 Cardiovascular Markers Lab Results  Component Value Date   TROPONINI <0.03 01/30/2017  HGB 13.5 05/20/2017   HCT 40.5 05/20/2017                 CA Markers No results found for: CEA, CA125, LABCA2               Note: Lab results reviewed.  [place]  Drug: Karen Edwards  reports that she does  not use drugs. Alcohol:  reports that she drinks about 0.6 oz of alcohol per week. Tobacco:  reports that she has been smoking cigarettes.  She has a 3.70 pack-year smoking history. she has never used smokeless tobacco. Medical:  has a past medical history of Anxiety, Chronic back pain, Degenerative joint disease, Depression, Hypotension, IBS (irritable bowel syndrome) (05/2015), and MVP (mitral valve prolapse). Family: family history includes Arthritis in her mother; Congestive Heart Failure in her maternal grandfather; Depression in her mother; Diabetes in her maternal aunt; Diabetes Mellitus I in her maternal grandmother; Gout in her maternal aunt; Hearing loss in her father; Heart attack in her maternal grandfather; Hyperlipidemia in her father and mother; Hypertension in her father and mother; Hypothyroidism in her mother; Kidney disease in her mother; Prostate cancer in her paternal grandfather; Stroke in her maternal grandmother; Thyroid disease in her maternal aunt; Vision loss in her mother.  Past Surgical History:  Procedure Laterality Date  . ANKLE SURGERY Right 03/02/2014   repair of tendon and sural nerve right ankle  . CERVICAL SPINE SURGERY  02/21/2016   arthrodesis of anterior cervical spine (fusion of C5-6,C6-7 and insertion of bone allograft  . CESAREAN SECTION    . CESAREAN SECTION WITH BILATERAL TUBAL LIGATION  1990  . COLONOSCOPY  04/19/2015  . HYSTEROSCOPY W/D&C  12/24/2013   Active Ambulatory Problems    Diagnosis Date Noted  . Chronic low back pain (Primary Area of Pain) (Bilateral) (R>L) 02/01/2013  . Menorrhagia 02/01/2013  . Fatigue 02/01/2013  . DDD (degenerative disc disease), lumbar 03/07/2015  . DDD (degenerative disc disease), cervical 03/07/2015  . Chronic occipital neuralgia (Fifth Area of Pain) (Bilateral) (R>L) 03/07/2015  . Lumbar facet syndrome (Bilateral) (L>R) 03/07/2015  . DDD (degenerative disc disease), lumbosacral 03/19/2015  . Lumbar radicular  pain (Right) (L4) 03/19/2015  . Sacroiliac joint dysfunction (Bilateral) 03/19/2015  . Depression 03/21/2015  . Rectal bleeding 03/21/2015  . Insomnia 01/09/2016  . Chronic constipation 01/09/2016  . Pharmacologic therapy 07/11/2016  . Hyperlipidemia 07/11/2016  . Chronic neck pain (Fourth Area of Pain) (Bilateral) (R>L) 01/26/2016  . GERD (gastroesophageal reflux disease) 09/04/2017  . Numbness and tingling of upper extremity (Right) 01/26/2016  . Chronic upper extremity pain (Bilateral) (R>L) 09/04/2017  . Chronic sacroiliac joint pain (Bilateral) (L>R) 09/04/2017  . Lumbar facet hypertrophy (Bilateral) 09/04/2017  . L1-2 disc extrusion (Right) 09/04/2017  . Cervical foraminal stenosis (C5-6) (Bilateral) 09/04/2017  . Long term prescription opiate use 09/04/2017  . Opiate use 09/04/2017  . Disorder of skeletal system 09/04/2017  . Problems influencing health status 09/04/2017  . Chronic pain syndrome 09/04/2017  . Chronic cervical radicular pain (Right: C6/C7) (Left: C5/T1) 09/04/2017  . Chronic lumbar radicular pain 09/04/2017  . Chronic hip pain [facility] Area of Pain) (Bilateral) (R>L) 09/08/2017  . Chronic lower extremity pain (Secondary Area of Pain) (Bilateral) (R>L) 09/08/2017  . Chronic shoulder pain (Bilateral) (L>R) 09/08/2017   Resolved Ambulatory Problems    Diagnosis Date Noted  . Fibroids 02/01/2013  . Lumbar radiculopathy 03/07/2015   Past Medical History:  Diagnosis Date  . Anxiety   . Chronic back  pain   . Degenerative joint disease   . Depression   . Hypotension   . IBS (irritable bowel syndrome) 05/2015  . MVP (mitral valve prolapse)    Constitutional Exam  General appearance: Well nourished, well developed, and well hydrated. In no apparent acute distress Vitals:   09/08/17 1152  BP: 136/80  Pulse: 86  Resp: 18  Temp: 98.6 F (37 C)  TempSrc: Oral  SpO2: 99%  Weight: 140 lb (63.5 kg)  Height: 5' 1"  (1.549 m)   BMI Assessment: Estimated body  mass index is 26.45 kg/m as calculated from the following:   Height as of this encounter: 5' 1"  (1.549 m).   Weight as of this encounter: 140 lb (63.5 kg).  BMI interpretation table: BMI level Category Range association with higher incidence of chronic pain  <18 kg/m2 Underweight   18.5-24.9 kg/m2 Ideal body weight   25-29.9 kg/m2 Overweight Increased incidence by 20%  30-34.9 kg/m2 Obese (Class I) Increased incidence by 68%  35-39.9 kg/m2 Severe obesity (Class II) Increased incidence by 136%  >40 kg/m2 Extreme obesity (Class III) Increased incidence by 254%   BMI Readings from Last 4 Encounters:  09/08/17 26.45 kg/m  05/20/17 27.59 kg/m  05/15/17 26.45 kg/m  05/05/17 22.78 kg/m   Wt Readings from Last 4 Encounters:  09/08/17 140 lb (63.5 kg)  05/20/17 146 lb (66.2 kg)  05/15/17 140 lb (63.5 kg)  05/05/17 141 lb 1.9 oz (64 kg)  Psych/Mental status: Alert, oriented x 3 (person, place, & time)       Eyes: PERLA Respiratory: No evidence of acute respiratory distress  Cervical Spine Area Exam  Skin & Axial Inspection: No masses, redness, edema, swelling, or associated skin lesions Alignment: Symmetrical Functional ROM: Unrestricted ROM      Stability: No instability detected Muscle Tone/Strength: Functionally intact. No obvious neuro-muscular anomalies detected. Sensory (Neurological): Unimpaired Palpation: No palpable anomalies              Upper Extremity (UE) Exam    Side: Right upper extremity  Side: Left upper extremity  Skin & Extremity Inspection: Skin color, temperature, and hair growth are WNL. No peripheral edema or cyanosis. No masses, redness, swelling, asymmetry, or associated skin lesions. No contractures.  Skin & Extremity Inspection: Skin color, temperature, and hair growth are WNL. No peripheral edema or cyanosis. No masses, redness, swelling, asymmetry, or associated skin lesions. No contractures.  Functional ROM: Unrestricted ROM          Functional ROM:  Unrestricted ROM          Muscle Tone/Strength: Functionally intact. No obvious neuro-muscular anomalies detected.  Muscle Tone/Strength: Functionally intact. No obvious neuro-muscular anomalies detected.  Sensory (Neurological): Unimpaired          Sensory (Neurological): Unimpaired          Palpation: No palpable anomalies              Palpation: No palpable anomalies              Specialized Test(s): Deferred         Specialized Test(s): Deferred          Thoracic Spine Area Exam  Skin & Axial Inspection: No masses, redness, or swelling Alignment: Symmetrical Functional ROM: Unrestricted ROM Stability: No instability detected Muscle Tone/Strength: Functionally intact. No obvious neuro-muscular anomalies detected. Sensory (Neurological): Unimpaired Muscle strength & Tone: No palpable anomalies  Lumbar Spine Area Exam  Skin & Axial Inspection: No masses, redness, or  swelling Alignment: Symmetrical Functional ROM: Decreased ROM      Stability: No instability detected Muscle Tone/Strength: Functionally intact. No obvious neuro-muscular anomalies detected. Sensory (Neurological): Movement-associated pain Palpation: Complains of area being tender to palpation       Provocative Tests: Lumbar Hyperextension and rotation test: Positive bilaterally for facet joint pain. Lumbar Lateral bending test: evaluation deferred today       Patrick's Maneuver: Positive for bilateral S-I arthralgia and for bilateral hip arthralgia  Gait & Posture Assessment  Ambulation: Limited Gait: Antalgic Posture: Difficulty standing up straight, due to pain   Lower Extremity Exam    Side: Right lower extremity  Side: Left lower extremity  Skin & Extremity Inspection: Skin color, temperature, and hair growth are WNL. No peripheral edema or cyanosis. No masses, redness, swelling, asymmetry, or associated skin lesions. No contractures.  Skin & Extremity Inspection: Skin color, temperature, and hair growth are WNL.  No peripheral edema or cyanosis. No masses, redness, swelling, asymmetry, or associated skin lesions. No contractures.  Functional ROM: Unrestricted ROM          Functional ROM: Unrestricted ROM          Muscle Tone/Strength: Able to Toe-walk & Heel-walk without problems  Muscle Tone/Strength: Able to Toe-walk & Heel-walk without problems  Sensory (Neurological): Unimpaired  Sensory (Neurological): Unimpaired  Palpation: No palpable anomalies  Palpation: No palpable anomalies   Assessment  Primary Diagnosis & Pertinent Problem List: The primary encounter diagnosis was Chronic low back pain (Primary Area of Pain) (Bilateral) (R>L). Diagnoses of Chronic sacroiliac joint pain (Bilateral), Lumbar facet syndrome (Bilateral) (L>R), Chronic lower extremity pain (Secondary Area of Pain) (Bilateral) (R>L), Chronic lumbar radicular pain, Lumbar radicular pain, Chronic hip pain (Tertiary Area of Pain) (Bilateral) (R>L), Chronic neck pain (Fourth Area of Pain) (Bilateral) (R>L), Chronic upper extremity pain (Bilateral) (R>L), Chronic shoulder pain (Bilateral) (L>R), Chronic cervical radicular pain, Chronic occipital neuralgia (Fifth Area of Pain) (Bilateral) (R>L), Chronic pain syndrome, Disorder of skeletal system, Problems influencing health status, Pharmacologic therapy, Long term prescription opiate use, and Opiate use were also pertinent to this visit.  Visit Diagnosis (New problems to examiner): 1. Chronic low back pain (Primary Area of Pain) (Bilateral) (R>L)   2. Chronic sacroiliac joint pain (Bilateral)   3. Lumbar facet syndrome (Bilateral) (L>R)   4. Chronic lower extremity pain (Secondary Area of Pain) (Bilateral) (R>L)   5. Chronic lumbar radicular pain   6. Lumbar radicular pain   7. Chronic hip pain (Tertiary Area of Pain) (Bilateral) (R>L)   8. Chronic neck pain (Fourth Area of Pain) (Bilateral) (R>L)   9. Chronic upper extremity pain (Bilateral) (R>L)   10. Chronic shoulder pain (Bilateral)  (L>R)   11. Chronic cervical radicular pain   12. Chronic occipital neuralgia (Fifth Area of Pain) (Bilateral) (R>L)   13. Chronic pain syndrome   14. Disorder of skeletal system   15. Problems influencing health status   16. Pharmacologic therapy   17. Long term prescription opiate use   18. Opiate use    Plan of Care (Initial workup plan)  Note: Karen Edwards was reminded that as per protocol, today's visit has been an evaluation only. We have not taken over the patient's controlled substance management.  Problem-specific plan: No problem-specific Assessment & Plan notes found for this encounter.   Lab Orders     Compliance Drug Analysis, Ur     Comp. Metabolic Panel (12)     Magnesium     Vitamin  B12     Sedimentation rate     25-Hydroxyvitamin D Lcms D2+D3     C-reactive protein  Imaging Orders     DG Lumbar Spine Complete W/Bend     DG Si Joints     DG HIP UNILAT W OR W/O PELVIS 2-3 VIEWS RIGHT     DG HIP UNILAT W OR W/O PELVIS 2-3 VIEWS LEFT  Referral Orders     Ambulatory referral to Psychology Procedure Orders    No procedure(s) ordered today   Pharmacotherapy (current): Medications ordered:  Meds ordered this encounter  Medications  . orphenadrine (NORFLEX) injection 60 mg  . ketorolac (TORADOL) injection 60 mg   Medications administered during this visit: We administered orphenadrine and ketorolac.   Pharmacological management options:  Opioid Analgesics: The patient was informed that there is no guarantee that she would be a candidate for opioid analgesics. The decision will be made following CDC guidelines. This decision will be based on the results of diagnostic studies, as well as Karen Edwards's risk profile.   Membrane stabilizer: To be determined at a later time  Muscle relaxant: To be determined at a later time  NSAID: To be determined at a later time  Other analgesic(s): To be determined at a later time   Interventional management options: Ms.  Edwards was informed that there is no guarantee that she would be a candidate for interventional therapies. The decision will be based on the results of diagnostic studies, as well as Karen Edwards's risk profile.  Procedure(s) under consideration:  Diagnostic bilateral lumbar facet block  Possible bilateral lumbar facet RFA  Diagnostic bilateral sacroiliac joint block  Possible bilateral sacroiliac joint RFA  Diagnostic bilateral intra-articular hip joint injection  Diagnostic bilateral femoral nerve block + obturator nerve block  Possible bilateral femoral nerve + obturator nerve RFA  Diagnostic right-sided L1-2 lumbar epidural steroid injection  Diagnostic right-sided L1-2 transforaminal epidural steroid injection  Diagnostic bilateral L4-5 lumbar transforaminal epidural steroid injection  Diagnostic bilateral cervical facet block  Possible bilateral cervical facet RFA  Diagnostic right-sided cervical epidural steroid injection  Diagnostic bilateral greater occipital nerve block  Possible bilateral occipital nerve RFA  Diagnostic bilateral intra-articular shoulder joint injection  Diagnostic bilateral suprascapular nerve block  Possible bilateral suprascapular nerve RFA    Provider-requested follow-up: Return for 2nd Visit w/ Dr. Dossie Arbour after [facility] eval.  Future Appointments  Date Time Provider [place]  09/15/2017 11:30 AM Pleas Koch, NP [facility]    Primary Care Physician: Pleas Koch, NP Location: [facility] Note by: Gaspar Cola, MD Date: 09/08/2017; Time: 6:59 PM

## 2017-09-08 ENCOUNTER — Ambulatory Visit: Payer: PPO | Attending: Pain Medicine | Admitting: Pain Medicine

## 2017-09-08 ENCOUNTER — Other Ambulatory Visit: Payer: Self-pay

## 2017-09-08 ENCOUNTER — Encounter: Payer: Self-pay | Admitting: Pain Medicine

## 2017-09-08 ENCOUNTER — Ambulatory Visit
Admission: EM | Admit: 2017-09-08 | Discharge: 2017-09-08 | Disposition: A | Discharge status: Home / Self Care | Payer: PPO | Source: Ambulatory Visit | Attending: Pain Medicine | Admitting: Pain Medicine

## 2017-09-08 ENCOUNTER — Ambulatory Visit
Admission: RE | Admit: 2017-09-08 | Discharge: 2017-09-08 | Disposition: A | Discharge status: Home / Self Care | Payer: PPO | Source: Ambulatory Visit | Attending: Pain Medicine | Admitting: Pain Medicine

## 2017-09-08 VITALS — BP 136/80 | HR 86 | Temp 98.6°F | Resp 18 | Ht 61.0 in | Wt 140.0 lb

## 2017-09-08 DIAGNOSIS — M79605 Pain in left leg: Secondary | ICD-10-CM | POA: Diagnosis not present

## 2017-09-08 DIAGNOSIS — Z789 Other specified health status: Secondary | ICD-10-CM

## 2017-09-08 DIAGNOSIS — Z981 Arthrodesis status: Secondary | ICD-10-CM | POA: Insufficient documentation

## 2017-09-08 DIAGNOSIS — M25552 Pain in left hip: Secondary | ICD-10-CM | POA: Insufficient documentation

## 2017-09-08 DIAGNOSIS — M5442 Lumbago with sciatica, left side: Secondary | ICD-10-CM | POA: Insufficient documentation

## 2017-09-08 DIAGNOSIS — M5416 Radiculopathy, lumbar region: Secondary | ICD-10-CM

## 2017-09-08 DIAGNOSIS — M25512 Pain in left shoulder: Secondary | ICD-10-CM | POA: Diagnosis not present

## 2017-09-08 DIAGNOSIS — Z841 Family history of disorders of kidney and ureter: Secondary | ICD-10-CM | POA: Insufficient documentation

## 2017-09-08 DIAGNOSIS — G894 Chronic pain syndrome: Secondary | ICD-10-CM | POA: Diagnosis not present

## 2017-09-08 DIAGNOSIS — M899 Disorder of bone, unspecified: Secondary | ICD-10-CM

## 2017-09-08 DIAGNOSIS — G8929 Other chronic pain: Secondary | ICD-10-CM | POA: Diagnosis not present

## 2017-09-08 DIAGNOSIS — M79602 Pain in left arm: Secondary | ICD-10-CM

## 2017-09-08 DIAGNOSIS — M25551 Pain in right hip: Secondary | ICD-10-CM

## 2017-09-08 DIAGNOSIS — M25511 Pain in right shoulder: Secondary | ICD-10-CM | POA: Insufficient documentation

## 2017-09-08 DIAGNOSIS — M79601 Pain in right arm: Secondary | ICD-10-CM | POA: Diagnosis not present

## 2017-09-08 DIAGNOSIS — K219 Gastro-esophageal reflux disease without esophagitis: Secondary | ICD-10-CM | POA: Insufficient documentation

## 2017-09-08 DIAGNOSIS — M79604 Pain in right leg: Secondary | ICD-10-CM

## 2017-09-08 DIAGNOSIS — M545 Low back pain: Secondary | ICD-10-CM | POA: Diagnosis not present

## 2017-09-08 DIAGNOSIS — K589 Irritable bowel syndrome without diarrhea: Secondary | ICD-10-CM | POA: Diagnosis not present

## 2017-09-08 DIAGNOSIS — M47816 Spondylosis without myelopathy or radiculopathy, lumbar region: Secondary | ICD-10-CM | POA: Insufficient documentation

## 2017-09-08 DIAGNOSIS — M5412 Radiculopathy, cervical region: Secondary | ICD-10-CM

## 2017-09-08 DIAGNOSIS — Z79891 Long term (current) use of opiate analgesic: Secondary | ICD-10-CM | POA: Diagnosis not present

## 2017-09-08 DIAGNOSIS — Z823 Family history of stroke: Secondary | ICD-10-CM | POA: Insufficient documentation

## 2017-09-08 DIAGNOSIS — F1190 Opioid use, unspecified, uncomplicated: Secondary | ICD-10-CM | POA: Diagnosis not present

## 2017-09-08 DIAGNOSIS — Z8249 Family history of ischemic heart disease and other diseases of the circulatory system: Secondary | ICD-10-CM | POA: Insufficient documentation

## 2017-09-08 DIAGNOSIS — M5441 Lumbago with sciatica, right side: Secondary | ICD-10-CM | POA: Diagnosis not present

## 2017-09-08 DIAGNOSIS — E785 Hyperlipidemia, unspecified: Secondary | ICD-10-CM | POA: Diagnosis not present

## 2017-09-08 DIAGNOSIS — Z8261 Family history of arthritis: Secondary | ICD-10-CM | POA: Insufficient documentation

## 2017-09-08 DIAGNOSIS — Z9889 Other specified postprocedural states: Secondary | ICD-10-CM | POA: Insufficient documentation

## 2017-09-08 DIAGNOSIS — Z822 Family history of deafness and hearing loss: Secondary | ICD-10-CM | POA: Insufficient documentation

## 2017-09-08 DIAGNOSIS — M533 Sacrococcygeal disorders, not elsewhere classified: Secondary | ICD-10-CM | POA: Diagnosis not present

## 2017-09-08 DIAGNOSIS — Z833 Family history of diabetes mellitus: Secondary | ICD-10-CM | POA: Insufficient documentation

## 2017-09-08 DIAGNOSIS — F17200 Nicotine dependence, unspecified, uncomplicated: Secondary | ICD-10-CM | POA: Insufficient documentation

## 2017-09-08 DIAGNOSIS — Z821 Family history of blindness and visual loss: Secondary | ICD-10-CM | POA: Insufficient documentation

## 2017-09-08 DIAGNOSIS — Z79899 Other long term (current) drug therapy: Secondary | ICD-10-CM | POA: Insufficient documentation

## 2017-09-08 DIAGNOSIS — M542 Cervicalgia: Secondary | ICD-10-CM

## 2017-09-08 DIAGNOSIS — M5481 Occipital neuralgia: Secondary | ICD-10-CM

## 2017-09-08 MED ORDER — ORPHENADRINE CITRATE 30 MG/ML IJ SOLN
60.0000 mg | Freq: Once | INTRAMUSCULAR | Status: AC
  Administered 2017-09-08: 60 mg via INTRAMUSCULAR
  Filled 2017-09-08: qty 2

## 2017-09-08 MED ORDER — KETOROLAC TROMETHAMINE 60 MG/2ML IM SOLN
60.0000 mg | Freq: Once | INTRAMUSCULAR | Status: AC
  Administered 2017-09-08: 60 mg via INTRAMUSCULAR
  Filled 2017-09-08: qty 2

## 2017-09-08 NOTE — Progress Notes (Signed)
Safety precautions to be maintained throughout the outpatient stay will include: orient to surroundings, keep bed in low position, maintain call bell within reach at all times, provide assistance with transfer out of bed and ambulation.  

## 2017-09-08 NOTE — Patient Instructions (Signed)
____________________________________________________________________________________________  Pain Scale  Introduction: The pain score used by this practice is the Verbal Numerical Rating Scale (VNRS-11). This is an 11-point scale. It is for adults and children 10 years or older. There are significant differences in how the pain score is reported, used, and applied. Forget everything you learned in the past and learn this scoring system.  General Information: The scale should reflect your current level of pain. Unless you are specifically asked for the level of your worst pain, or your average pain. If you are asked for one of these two, then it should be understood that it is over the past 24 hours.  Basic Activities of Daily Living (ADL): Personal hygiene, dressing, eating, transferring, and using restroom.  Instructions: Most patients tend to report their level of pain as a combination of two factors, their physical pain and their psychosocial pain. This last one is also known as "suffering" and it is reflection of how physical pain affects you socially and psychologically. From now on, report them separately. From this point on, when asked to report your pain level, report only your physical pain. Use the following table for reference.  Pain Clinic Pain Levels (0-5/10)  Pain Level Score  Description  No Pain 0   Mild pain 1 Nagging, annoying, but does not interfere with basic activities of daily living (ADL). Patients are able to eat, bathe, get dressed, toileting (being able to get on and off the toilet and perform personal hygiene functions), transfer (move in and out of bed or a chair without assistance), and maintain continence (able to control bladder and bowel functions). Blood pressure and heart rate are unaffected. A normal heart rate for a healthy adult ranges from 60 to 100 bpm (beats per minute).   Mild to moderate pain 2 Noticeable and distracting. Impossible to hide from other  people. More frequent flare-ups. Still possible to adapt and function close to normal. It can be very annoying and may have occasional stronger flare-ups. With discipline, patients may get used to it and adapt.   Moderate pain 3 Interferes significantly with activities of daily living (ADL). It becomes difficult to feed, bathe, get dressed, get on and off the toilet or to perform personal hygiene functions. Difficult to get in and out of bed or a chair without assistance. Very distracting. With effort, it can be ignored when deeply involved in activities.   Moderately severe pain 4 Impossible to ignore for more than a few minutes. With effort, patients may still be able to manage work or participate in some social activities. Very difficult to concentrate. Signs of autonomic nervous system discharge are evident: dilated pupils (mydriasis); mild sweating (diaphoresis); sleep interference. Heart rate becomes elevated (>115 bpm). Diastolic blood pressure (lower number) rises above 100 mmHg. Patients find relief in laying down and not moving.   Severe pain 5 Intense and extremely unpleasant. Associated with frowning face and frequent crying. Pain overwhelms the senses.  Ability to do any activity or maintain social relationships becomes significantly limited. Conversation becomes difficult. Pacing back and forth is common, as getting into a comfortable position is nearly impossible. Pain wakes you up from deep sleep. Physical signs will be obvious: pupillary dilation; increased sweating; goosebumps; brisk reflexes; cold, clammy hands and feet; nausea, vomiting or dry heaves; loss of appetite; significant sleep disturbance with inability to fall asleep or to remain asleep. When persistent, significant weight loss is observed due to the complete loss of appetite and sleep deprivation.  Blood   pressure and heart rate becomes significantly elevated. Caution: If elevated blood pressure triggers a pounding headache  associated with blurred vision, then the patient should immediately seek attention at an urgent or emergency care unit, as these may be signs of an impending stroke.    Emergency Department Pain Levels (6-10/10)  Emergency Room Pain 6 Severely limiting. Requires emergency care and should not be seen or managed at an outpatient pain management facility. Communication becomes difficult and requires great effort. Assistance to reach the emergency department may be required. Facial flushing and profuse sweating along with potentially dangerous increases in heart rate and blood pressure will be evident.   Distressing pain 7 Self-care is very difficult. Assistance is required to transport, or use restroom. Assistance to reach the emergency department will be required. Tasks requiring coordination, such as bathing and getting dressed become very difficult.   Disabling pain 8 Self-care is no longer possible. At this level, pain is disabling. The individual is unable to do even the most "basic" activities such as walking, eating, bathing, dressing, transferring to a bed, or toileting. Fine motor skills are lost. It is difficult to think clearly.   Incapacitating pain 9 Pain becomes incapacitating. Thought processing is no longer possible. Difficult to remember your own name. Control of movement and coordination are lost.   The worst pain imaginable 10 At this level, most patients pass out from pain. When this level is reached, collapse of the autonomic nervous system occurs, leading to a sudden drop in blood pressure and heart rate. This in turn results in a temporary and dramatic drop in blood flow to the brain, leading to a loss of consciousness. Fainting is one of the body's self defense mechanisms. Passing out puts the brain in a calmed state and causes it to shut down for a while, in order to begin the healing process.    Summary: 1. Refer to this scale when providing us with your pain level. 2. Be  accurate and careful when reporting your pain level. This will help with your care. 3. Over-reporting your pain level will lead to loss of credibility. 4. Even a level of 1/10 means that there is pain and will be treated at our facility. 5. High, inaccurate reporting will be documented as "Symptom Exaggeration", leading to loss of credibility and suspicions of possible secondary gains such as obtaining more narcotics, or wanting to appear disabled, for fraudulent reasons. 6. Only pain levels of 5 or below will be seen at our facility. 7. Pain levels of 6 and above will be sent to the Emergency Department and the appointment cancelled. ____________________________________________________________________________________________   ____________________________________________________________________________________________  Appointment Policy Summary  It is our goal and responsibility to provide the medical community with assistance in the evaluation and management of patients with chronic pain. Unfortunately our resources are limited. Because we do not have an unlimited amount of time, or available appointments, we are required to closely monitor and manage their use. The following rules exist to maximize their use:  Patient's responsibilities: 1. Punctuality:  At what time should I arrive? You should be physically present in our office 30 minutes before your scheduled appointment. Your scheduled appointment is with your assigned healthcare provider. However, it takes 5-10 minutes to be "checked-in", and another 15 minutes for the nurses to do the admission. If you arrive to our office at the time you were given for your appointment, you will end up being at least 20-25 minutes late to your appointment with the   provider. 2. Tardiness:  What happens if I arrive only a few minutes after my scheduled appointment time? You will need to reschedule your appointment. The cutoff is your appointment time.  This is why it is so important that you arrive at least 30 minutes before that appointment. If you have an appointment scheduled for 10:00 AM and you arrive at 10:01, you will be required to reschedule your appointment.  3. Plan ahead:  Always assume that you will encounter traffic on your way in. Plan for it. If you are dependent on a driver, make sure they understand these rules and the need to arrive early. 4. Other appointments and responsibilities:  Avoid scheduling any other appointments before or after your pain clinic appointments.  5. Be prepared:  Write down everything that you need to discuss with your healthcare provider and give this information to the admitting nurse. Write down the medications that you will need refilled. Bring your pills and bottles (even the empty ones), to all of your appointments, except for those where a procedure is scheduled. 6. No children or pets:  Find someone to take care of them. It is not appropriate to bring them in. 7. Scheduling changes:  We request "advanced notification" of any changes or cancellations. 8. Advanced notification:  Defined as a time period of more than 24 hours prior to the originally scheduled appointment. This allows for the appointment to be offered to other patients. 9. Rescheduling:  When a visit is rescheduled, it will require the cancellation of the original appointment. For this reason they both fall within the category of "Cancellations".  10. Cancellations:  They require advanced notification. Any cancellation less than 24 hours before the  appointment will be recorded as a "No Show". 11. No Show:  Defined as an unkept appointment where the patient failed to notify or declare to the practice their intention or inability to keep the appointment.  Corrective process for repeat offenders:  1. Tardiness: Three (3) episodes of rescheduling due to late arrivals will be recorded as one (1) "No Show". 2. Cancellation or  reschedule: Three (3) cancellations or rescheduling will be recorded as one (1) "No Show". 3. "No Shows": Three (3) "No Shows" within a 12 month period will result in discharge from the practice.  ____________________________________________________________________________________________   

## 2017-09-09 ENCOUNTER — Ambulatory Visit: Payer: Self-pay | Admitting: Primary Care

## 2017-09-12 LAB — COMPLIANCE DRUG ANALYSIS, UR

## 2017-09-15 ENCOUNTER — Ambulatory Visit: Payer: PPO | Admitting: Primary Care

## 2017-09-15 ENCOUNTER — Encounter: Payer: Self-pay | Admitting: Pain Medicine

## 2017-09-16 LAB — COMP. METABOLIC PANEL (12)
A/G RATIO: 1.9 (ref 1.2–2.2)
AST: 18 IU/L (ref 0–40)
Albumin: 4.7 g/dL (ref 3.5–5.5)
Alkaline Phosphatase: 73 IU/L (ref 39–117)
BUN / CREAT RATIO: 8 — AB (ref 9–23)
BUN: 7 mg/dL (ref 6–24)
Bilirubin Total: 0.3 mg/dL (ref 0.0–1.2)
CALCIUM: 9.8 mg/dL (ref 8.7–10.2)
CHLORIDE: 99 mmol/L (ref 96–106)
Creatinine, Ser: 0.84 mg/dL (ref 0.57–1.00)
GFR, EST AFRICAN AMERICAN: 93 mL/min/{1.73_m2} (ref >59)
GFR, EST NON AFRICAN AMERICAN: 81 mL/min/{1.73_m2} (ref >59)
GLOBULIN, TOTAL: 2.5 g/dL (ref 1.5–4.5)
GLUCOSE: 90 mg/dL (ref 65–99)
Potassium: 4.4 mmol/L (ref 3.5–5.2)
Sodium: 139 mmol/L (ref 134–144)
TOTAL PROTEIN: 7.2 g/dL (ref 6.0–8.5)

## 2017-09-16 LAB — VITAMIN B12: Vitamin B-12: >2000 pg/mL — ABNORMAL HIGH (ref 232–1245)

## 2017-09-16 LAB — C-REACTIVE PROTEIN: CRP: 1.4 mg/L (ref 0.0–4.9)

## 2017-09-16 LAB — 25-HYDROXYVITAMIN D LCMS D2+D3: 25-HYDROXY, VITAMIN D-3: 51 ng/mL

## 2017-09-16 LAB — SEDIMENTATION RATE: SED RATE: 11 mm/h (ref 0–40)

## 2017-09-16 LAB — 25-HYDROXY VITAMIN D LCMS D2+D3: 25-Hydroxy, Vitamin D: 51 ng/mL

## 2017-09-16 LAB — MAGNESIUM: Magnesium: 2.1 mg/dL (ref 1.6–2.3)

## 2017-09-24 ENCOUNTER — Other Ambulatory Visit: Payer: Self-pay | Admitting: Primary Care

## 2017-09-24 DIAGNOSIS — G4700 Insomnia, unspecified: Secondary | ICD-10-CM

## 2017-09-24 DIAGNOSIS — F329 Major depressive disorder, single episode, unspecified: Secondary | ICD-10-CM

## 2017-09-24 DIAGNOSIS — F32A Depression, unspecified: Secondary | ICD-10-CM

## 2017-09-24 NOTE — Telephone Encounter (Signed)
Patient overdue for follow-up.  Will allow 30-day supply for refills, will need a follow-up visit for any additional refills.  This appointment can be a physical if she chooses as she is overdue.

## 2017-10-10 ENCOUNTER — Encounter: Payer: Self-pay | Admitting: Primary Care

## 2017-10-13 ENCOUNTER — Ambulatory Visit: Payer: PPO | Attending: Pain Medicine | Admitting: Pain Medicine

## 2017-10-13 ENCOUNTER — Other Ambulatory Visit: Payer: Self-pay

## 2017-10-13 ENCOUNTER — Encounter: Payer: Self-pay | Admitting: Pain Medicine

## 2017-10-13 VITALS — BP 122/79 | HR 87 | Temp 98.6°F | Resp 16 | Ht 61.0 in | Wt 140.0 lb

## 2017-10-13 DIAGNOSIS — M25511 Pain in right shoulder: Secondary | ICD-10-CM | POA: Diagnosis not present

## 2017-10-13 DIAGNOSIS — M79604 Pain in right leg: Secondary | ICD-10-CM | POA: Insufficient documentation

## 2017-10-13 DIAGNOSIS — K589 Irritable bowel syndrome without diarrhea: Secondary | ICD-10-CM | POA: Insufficient documentation

## 2017-10-13 DIAGNOSIS — M25512 Pain in left shoulder: Secondary | ICD-10-CM | POA: Diagnosis not present

## 2017-10-13 DIAGNOSIS — M47896 Other spondylosis, lumbar region: Secondary | ICD-10-CM | POA: Diagnosis not present

## 2017-10-13 DIAGNOSIS — F1190 Opioid use, unspecified, uncomplicated: Secondary | ICD-10-CM

## 2017-10-13 DIAGNOSIS — Z79891 Long term (current) use of opiate analgesic: Secondary | ICD-10-CM | POA: Diagnosis not present

## 2017-10-13 DIAGNOSIS — M4802 Spinal stenosis, cervical region: Secondary | ICD-10-CM | POA: Insufficient documentation

## 2017-10-13 DIAGNOSIS — G894 Chronic pain syndrome: Secondary | ICD-10-CM | POA: Diagnosis not present

## 2017-10-13 DIAGNOSIS — M7918 Myalgia, other site: Secondary | ICD-10-CM | POA: Diagnosis not present

## 2017-10-13 DIAGNOSIS — M5137 Other intervertebral disc degeneration, lumbosacral region: Secondary | ICD-10-CM | POA: Insufficient documentation

## 2017-10-13 DIAGNOSIS — M47816 Spondylosis without myelopathy or radiculopathy, lumbar region: Secondary | ICD-10-CM | POA: Diagnosis not present

## 2017-10-13 DIAGNOSIS — M79605 Pain in left leg: Secondary | ICD-10-CM | POA: Diagnosis not present

## 2017-10-13 DIAGNOSIS — F17210 Nicotine dependence, cigarettes, uncomplicated: Secondary | ICD-10-CM | POA: Insufficient documentation

## 2017-10-13 DIAGNOSIS — F419 Anxiety disorder, unspecified: Secondary | ICD-10-CM | POA: Insufficient documentation

## 2017-10-13 DIAGNOSIS — G8929 Other chronic pain: Secondary | ICD-10-CM | POA: Insufficient documentation

## 2017-10-13 DIAGNOSIS — M5441 Lumbago with sciatica, right side: Secondary | ICD-10-CM

## 2017-10-13 DIAGNOSIS — I341 Nonrheumatic mitral (valve) prolapse: Secondary | ICD-10-CM | POA: Insufficient documentation

## 2017-10-13 DIAGNOSIS — K219 Gastro-esophageal reflux disease without esophagitis: Secondary | ICD-10-CM | POA: Diagnosis not present

## 2017-10-13 DIAGNOSIS — M533 Sacrococcygeal disorders, not elsewhere classified: Secondary | ICD-10-CM | POA: Insufficient documentation

## 2017-10-13 DIAGNOSIS — M5116 Intervertebral disc disorders with radiculopathy, lumbar region: Secondary | ICD-10-CM | POA: Diagnosis not present

## 2017-10-13 DIAGNOSIS — M8938 Hypertrophy of bone, other site: Secondary | ICD-10-CM | POA: Insufficient documentation

## 2017-10-13 DIAGNOSIS — M542 Cervicalgia: Secondary | ICD-10-CM | POA: Diagnosis not present

## 2017-10-13 DIAGNOSIS — M4722 Other spondylosis with radiculopathy, cervical region: Secondary | ICD-10-CM | POA: Diagnosis not present

## 2017-10-13 DIAGNOSIS — E785 Hyperlipidemia, unspecified: Secondary | ICD-10-CM | POA: Diagnosis not present

## 2017-10-13 DIAGNOSIS — M25552 Pain in left hip: Secondary | ICD-10-CM | POA: Diagnosis not present

## 2017-10-13 DIAGNOSIS — M5481 Occipital neuralgia: Secondary | ICD-10-CM | POA: Insufficient documentation

## 2017-10-13 DIAGNOSIS — M25551 Pain in right hip: Secondary | ICD-10-CM | POA: Insufficient documentation

## 2017-10-13 DIAGNOSIS — F329 Major depressive disorder, single episode, unspecified: Secondary | ICD-10-CM | POA: Diagnosis not present

## 2017-10-13 DIAGNOSIS — M5442 Lumbago with sciatica, left side: Secondary | ICD-10-CM

## 2017-10-13 DIAGNOSIS — G4700 Insomnia, unspecified: Secondary | ICD-10-CM | POA: Insufficient documentation

## 2017-10-13 DIAGNOSIS — N920 Excessive and frequent menstruation with regular cycle: Secondary | ICD-10-CM | POA: Insufficient documentation

## 2017-10-13 DIAGNOSIS — Z79899 Other long term (current) drug therapy: Secondary | ICD-10-CM | POA: Insufficient documentation

## 2017-10-13 DIAGNOSIS — I959 Hypotension, unspecified: Secondary | ICD-10-CM | POA: Insufficient documentation

## 2017-10-13 MED ORDER — TIZANIDINE HCL 4 MG PO CAPS: 4.0000 mg | ORAL_CAPSULE | Freq: Three times a day (TID) | ORAL | 2 refills | Status: DC | PRN

## 2017-10-13 MED ORDER — HYDROCODONE-ACETAMINOPHEN 5-325 MG PO TABS: 1.0000 | ORAL_TABLET | Freq: Two times a day (BID) | ORAL | 0 refills | Status: DC

## 2017-10-13 NOTE — Patient Instructions (Addendum)
____________________________________________________________________________________________  Preparing for Procedure with Sedation Instructions: . Oral Intake: Do not eat or drink anything for at least 8 hours prior to your procedure. . Transportation: Public transportation is not allowed. Bring an adult driver. The driver must be physically present in our waiting room before any procedure can be started. Marland Kitchen Physical Assistance: Bring an adult physically capable of assisting you, in the event you need help. This adult should keep you company at home for at least 6 hours after the procedure. . Blood Pressure Medicine: Take your blood pressure medicine with a sip of water the morning of the procedure. . Blood thinners:  . Diabetics on insulin: Notify the staff so that you can be scheduled 1st case in the morning. If your diabetes requires high dose insulin, take only  of your normal insulin dose the morning of the procedure and notify the staff that you have done so. . Preventing infections: Shower with an antibacterial soap the morning of your procedure. . Build-up your immune system: Take 1000 mg of Vitamin C with every meal (3 times a day) the day prior to your procedure. Marland Kitchen Antibiotics: Inform the staff if you have a condition or reason that requires you to take antibiotics before dental procedures. . Pregnancy: If you are pregnant, call and cancel the procedure. . Sickness: If you have a cold, fever, or any active infections, call and cancel the procedure. . Arrival: You must be in the facility at least 30 minutes prior to your scheduled procedure. . Children: Do not bring children with you. . Dress appropriately: Bring dark clothing that you would not mind if they get stained. . Valuables: Do not bring any jewelry or valuables. Procedure appointments are reserved for interventional treatments only. Marland Kitchen No Prescription Refills. . No medication changes will be discussed during procedure  appointments. . No disability issues will be discussed. ____________________________________________________________________________________________    ____________________________________________________________________________________________  Medication Rules  Applies to: All patients receiving prescriptions (written or electronic).  Pharmacy of record: Pharmacy where electronic prescriptions will be sent. If written prescriptions are taken to a different pharmacy, please inform the nursing staff. The pharmacy listed in the electronic medical record should be the one where you would like electronic prescriptions to be sent.  Prescription refills: Only during scheduled appointments. Applies to both, written and electronic prescriptions.  NOTE: The following applies primarily to controlled substances (Opioid* Pain Medications).   Patient's responsibilities: 1. Pain Pills: Bring all pain pills to every appointment (except for procedure appointments). 2. Pill Bottles: Bring pills in original pharmacy bottle. Always bring newest bottle. Bring bottle, even if empty. 3. Medication refills: You are responsible for knowing and keeping track of what medications you need refilled. The day before your appointment, write a list of all prescriptions that need to be refilled. Bring that list to your appointment and give it to the admitting nurse. Prescriptions will be written only during appointments. If you forget a medication, it will not be "Called in", "Faxed", or "electronically sent". You will need to get another appointment to get these prescribed. 4. Prescription Accuracy: You are responsible for carefully inspecting your prescriptions before leaving our office. Have the discharge nurse carefully go over each prescription with you, before taking them home. Make sure that your name is accurately spelled, that your address is correct. Check the name and dose of your medication to make sure it is  accurate. Check the number of pills, and the written instructions to make sure they are clear and  accurate. Make sure that you are given enough medication to last until your next medication refill appointment. 5. Taking Medication: Take medication as prescribed. Never take more pills than instructed. Never take medication more frequently than prescribed. Taking less pills or less frequently is permitted and encouraged, when it comes to controlled substances (written prescriptions).  6. Inform other Doctors: Always inform, all of your healthcare providers, of all the medications you take. 7. Pain Medication from other Providers: You are not allowed to accept any additional pain medication from any other Doctor or Healthcare provider. There are two exceptions to this rule. (see below) In the event that you require additional pain medication, you are responsible for notifying us, as stated below. 8. Medication Agreement: You are responsible for carefully reading and following our Medication Agreement. This must be signed before receiving any prescriptions from our practice. Safely store a copy of your signed Agreement. Violations to the Agreement will result in no further prescriptions. (Additional copies of our Medication Agreement are available upon request.) 9. Laws, Rules, & Regulations: All patients are expected to follow all Federal and [employer], [employer], Rules, [employer] [facility]. Ignorance of the Laws does not constitute a valid excuse. The use of any illegal substances is prohibited. 10. Adopted CDC guidelines & recommendations: Target dosing levels will be at or below 60 MME/day. Use of benzodiazepines** is not recommended.  Exceptions: There are only two exceptions to the rule of not receiving pain medications from other Healthcare Providers. 1. Exception #1 (Emergencies): In the event of an emergency (i.e.: accident requiring emergency care), you are allowed to receive additional pain medication.  However, you are responsible for: As soon as you are able, call our office [phone], at any time of the day or night, and leave a message stating your name, the date and nature of the emergency, and the name and dose of the medication prescribed. In the event that your call is answered by a member of our staff, make sure to document and save the date, time, and the name of the person that took your information.  2. Exception #2 (Planned Surgery): In the event that you are scheduled by another doctor or dentist to have any type of surgery or procedure, you are allowed (for a period no longer than 30 days), to receive additional pain medication, for the acute post-op pain. However, in this case, you are responsible for picking up a copy of our "Post-op Pain Management for Surgeons" handout, and giving it to your surgeon or dentist. This document is available at our office, and does not require an appointment to obtain it. Simply go to our office during business hours (Monday-Thursday from 8:00 AM to 4:00 PM) (Friday 8:00 AM to 12:00 Noon) or if you have a scheduled appointment with [place], prior to your surgery, and ask for it by name. In addition, you will need to provide [place] with your name, name of your surgeon, type of surgery, and date of procedure or surgery.  *Opioid medications include: morphine, codeine, oxycodone, oxymorphone, hydrocodone, hydromorphone, meperidine, tramadol, tapentadol, buprenorphine, fentanyl, methadone. **Benzodiazepine medications include: diazepam (Valium), alprazolam (Xanax), clonazepam (Klonopine), lorazepam (Ativan), clorazepate (Tranxene), chlordiazepoxide (Librium), estazolam (Prosom), oxazepam (Serax), temazepam (Restoril), triazolam (Halcion)  ____________________________________________________________________________________________ GENERAL RISKS AND COMPLICATIONS  What are the risk, side effects and possible complications? Generally speaking, most procedures are  safe.  However, with any procedure there are risks, side effects, and the possibility of complications.  The risks and complications are dependent upon the  sites that are lesioned, or the type of nerve block to be performed.  The closer the procedure is to the spine, the more serious the risks are.  Great care is taken when placing the radio frequency needles, block needles or lesioning probes, but sometimes complications can occur. 1. Infection: Any time there is an injection through the skin, there is a risk of infection.  This is why sterile conditions are used for these blocks.  There are four possible types of infection. 1. Localized skin infection. 2. Central Nervous System Infection-This can be in the form of Meningitis, which can be deadly. 3. Epidural Infections-This can be in the form of an epidural abscess, which can cause pressure inside of the spine, causing compression of the spinal cord with subsequent paralysis. This would require an emergency surgery to decompress, and there are no guarantees that the patient would recover from the paralysis. 4. Discitis-This is an infection of the intervertebral discs.  It occurs in about 1% of discography procedures.  It is difficult to treat and it may lead to surgery.        2. Pain: the needles have to go through skin and soft tissues, will cause soreness.       3. Damage to internal structures:  The nerves to be lesioned may be near blood vessels or    other nerves which can be potentially damaged.       4. Bleeding: Bleeding is more common if the patient is taking blood thinners such as  aspirin, Coumadin, Ticiid, Plavix, etc., or if he/she have some genetic predisposition  such as hemophilia. Bleeding into the spinal canal can cause compression of the spinal  cord with subsequent paralysis.  This would require an emergency surgery to  decompress and there are no guarantees that the patient would recover from the  paralysis.       5. Pneumothorax:   Puncturing of a lung is a possibility, every time a needle is introduced in  the area of the chest or upper back.  Pneumothorax refers to free air around the  collapsed lung(s), inside of the thoracic cavity (chest cavity).  Another two possible  complications related to a similar event would include: Hemothorax and Chylothorax.   These are variations of the Pneumothorax, where instead of air around the collapsed  lung(s), you may have blood or chyle, respectively.       6. Spinal headaches: They may occur with any procedures in the area of the spine.       7. Persistent CSF (Cerebro-Spinal Fluid) leakage: This is a rare problem, but may occur  with prolonged intrathecal or epidural catheters either due to the formation of a fistulous  track or a dural tear.       8. Nerve damage: By working so close to the spinal cord, there is always a possibility of  nerve damage, which could be as serious as a permanent spinal cord injury with  paralysis.       9. Death:  Although rare, severe deadly allergic reactions known as "Anaphylactic  reaction" can occur to any of the medications used.      10. Worsening of the symptoms:  We can always make thing worse.  What are the chances of something like this happening? Chances of any of this occuring are extremely low.  By statistics, you have more of a chance of getting killed in a motor vehicle accident: while driving to the hospital than any of  the above occurring .  Nevertheless, you should be aware that they are possibilities.  In general, it is similar to taking a shower.  Everybody knows that you can slip, hit your head and get killed.  Does that mean that you should not shower again?  Nevertheless always keep in mind that statistics do not mean anything if you happen to be on the wrong side of them.  Even if a procedure has a 1 (one) in a 1,000,000 (million) chance of going wrong, it you happen to be that one..Also, keep in mind that by statistics, you have more of  a chance of having something go wrong when taking medications.  Who should not have this procedure? If you are on a blood thinning medication (e.g. Coumadin, Plavix, see list of "Blood Thinners"), or if you have an active infection going on, you should not have the procedure.  If you are taking any blood thinners, please inform your physician.  How should I prepare for this procedure?  Do not eat or drink anything at least six hours prior to the procedure.  Bring a driver with you .  It cannot be a taxi.  Come accompanied by an adult that can drive you back, and that is strong enough to help you if your legs get weak or numb from the local anesthetic.  Take all of your medicines the morning of the procedure with just enough water to swallow them.  If you have diabetes, make sure that you are scheduled to have your procedure done first thing in the morning, whenever possible.  If you have diabetes, take only half of your insulin dose and notify our nurse that you have done so as soon as you arrive at the clinic.  If you are diabetic, but only take blood sugar pills (oral hypoglycemic), then do not take them on the morning of your procedure.  You may take them after you have had the procedure.  Do not take aspirin or any aspirin-containing medications, at least eleven (11) days prior to the procedure.  They may prolong bleeding.  Wear loose fitting clothing that may be easy to take off and that you would not mind if it got stained with Betadine or blood.  Do not wear any jewelry or perfume  Remove any nail coloring.  It will interfere with some of our monitoring equipment.  NOTE: Remember that this is not meant to be interpreted as a complete list of all possible complications.  Unforeseen problems may occur.  BLOOD THINNERS The following drugs contain aspirin or other products, which can cause increased bleeding during surgery and should not be taken for 2 weeks prior to and 1 week  after surgery.  If you should need take something for relief of minor pain, you may take acetaminophen which is found in Tylenol,m Datril, Anacin-3 and Panadol. It is not blood thinner. The products listed below are.  Do not take any of the products listed below in addition to any listed on your instruction sheet.  A.P.C or A.P.C with Codeine Codeine Phosphate Capsules #3 Ibuprofen Ridaura  ABC compound Congesprin Imuran rimadil  Advil Cope Indocin Robaxisal  Alka-Seltzer Effervescent Pain Reliever and Antacid Coricidin or Coricidin-D  Indomethacin Rufen  Alka-Seltzer plus Cold Medicine Cosprin Ketoprofen S-A-C Tablets  Anacin Analgesic Tablets or Capsules Coumadin Korlgesic Salflex  Anacin Extra Strength Analgesic tablets or capsules CP-2 Tablets Lanoril Salicylate  Anaprox Cuprimine Capsules Levenox Salocol  Anexsia-D Dalteparin Magan Salsalate  Anodynos Darvon  compound Magnesium Salicylate Sine-off  Ansaid Dasin Capsules Magsal Sodium Salicylate  Anturane Depen Capsules Marnal Soma  APF Arthritis pain formula Dewitt's Pills Measurin Stanback  Argesic Dia-Gesic Meclofenamic Sulfinpyrazone  Arthritis Bayer Timed Release Aspirin Diclofenac Meclomen Sulindac  Arthritis pain formula Anacin Dicumarol Medipren Supac  Analgesic (Safety coated) Arthralgen Diffunasal Mefanamic Suprofen  Arthritis Strength Bufferin Dihydrocodeine Mepro Compound Suprol  Arthropan liquid Dopirydamole Methcarbomol with Aspirin Synalgos  ASA tablets/Enseals Disalcid Micrainin Tagament  Ascriptin Doan's Midol Talwin  Ascriptin A/D Dolene Mobidin Tanderil  Ascriptin Extra Strength Dolobid Moblgesic Ticlid  Ascriptin with Codeine Doloprin or Doloprin with Codeine Momentum Tolectin  Asperbuf Duoprin Mono-gesic Trendar  Aspergum Duradyne Motrin or Motrin IB Triminicin  Aspirin plain, buffered or enteric coated Durasal Myochrisine Trigesic  Aspirin Suppositories Easprin Nalfon Trillsate  Aspirin with Codeine Ecotrin  Regular or Extra Strength Naprosyn Uracel  Atromid-S Efficin Naproxen Ursinus  Auranofin Capsules Elmiron Neocylate Vanquish  Axotal Emagrin Norgesic Verin  Azathioprine Empirin or Empirin with Codeine Normiflo Vitamin E  Azolid Emprazil Nuprin Voltaren  Bayer Aspirin plain, buffered or children's or timed BC Tablets or powders Encaprin Orgaran Warfarin Sodium  Buff-a-Comp Enoxaparin Orudis Zorpin  Buff-a-Comp with Codeine Equegesic Os-Cal-Gesic   Buffaprin Excedrin plain, buffered or Extra Strength Oxalid   Bufferin Arthritis Strength Feldene Oxphenbutazone   Bufferin plain or Extra Strength Feldene Capsules Oxycodone with Aspirin   Bufferin with Codeine Fenoprofen Fenoprofen Pabalate or Pabalate-SF   Buffets II Flogesic Panagesic   Buffinol plain or Extra Strength Florinal or Florinal with Codeine Panwarfarin   Buf-Tabs Flurbiprofen Penicillamine   Butalbital Compound Four-way cold tablets Penicillin   Butazolidin Fragmin Pepto-Bismol   Carbenicillin Geminisyn Percodan   Carna Arthritis Reliever Geopen Persantine   Carprofen Gold's salt Persistin   Chloramphenicol Goody's Phenylbutazone   Chloromycetin Haltrain Piroxlcam   Clmetidine heparin Plaquenil   Cllnoril Hyco-pap Ponstel   Clofibrate Hydroxy chloroquine Propoxyphen         Before stopping any of these medications, be sure to consult the physician who ordered them.  Some, such as Coumadin (Warfarin) are ordered to prevent or treat serious conditions such as "deep thrombosis", "pumonary embolisms", and other heart problems.  The amount of time that you may need off of the medication may also vary with the medication and the reason for which you were taking it.  If you are taking any of these medications, please make sure you notify your pain physician before you undergo any procedures.         Sacroiliac (SI) Joint Injection Patient Information  Description: The sacroiliac joint connects the scrum (very low back and  tailbone) to the ilium (a pelvic bone which also forms half of the hip joint).  Normally this joint experiences very little motion.  When this joint becomes inflamed or unstable low back and or hip and pelvis pain may result.  Injection of this joint with local anesthetics (numbing medicines) and steroids can provide diagnostic information and reduce pain.  This injection is performed with the aid of x-ray guidance into the tailbone area while you are lying on your stomach.   You may experience an electrical sensation down the leg while this is being done.  You may also experience numbness.  We also may ask if we are reproducing your normal pain during the injection.  Conditions which may be treated SI injection:   Low back, buttock, hip or leg pain  Preparation for the Injection:  3. Do not eat any solid food  or dairy products within 8 hours of your appointment.  4. You may drink clear liquids up to 3 hours before appointment.  Clear liquids include water, black coffee, juice or soda.  No milk or cream please. 5. You may take your regular medications, including pain medications with a sip of water before your appointment.  Diabetics should hold regular insulin (if take separately) and take 1/2 normal NPH dose the morning of the procedure.  Carry some sugar containing items with you to your appointment. 6. A driver must accompany you and be prepared to drive you home after your procedure. 7. Bring all of your current medications with you. 8. An IV may be inserted and sedation may be given at the discretion of the physician. 9. A blood pressure cuff, EKG and other monitors will often be applied during the procedure.  Some patients may need to have extra oxygen administered for a short period.  10. You will be asked to provide medical information, including your allergies, prior to the procedure.  We must know immediately if you are taking blood thinners (like Coumadin/Warfarin) or if you are allergic  to IV iodine contrast (dye).  We must know if you could possible be pregnant.  Possible side effects:   Bleeding from needle site  Infection (rare, may require surgery)  Nerve injury (rare)  Numbness & tingling (temporary)  A brief convulsion or seizure  Light-headedness (temporary)  Pain at injection site (several days)  Decreased blood pressure (temporary)  Weakness in the leg (temporary)   Call if you experience:   New onset weakness or numbness of an extremity below the injection site that last more than 8 hours.  Hives or difficulty breathing ( go to the emergency room)  Inflammation or drainage at the injection site  Any new symptoms which are concerning to you  Please note:  Although the local anesthetic injected can often make your back/ hip/ buttock/ leg feel good for several hours after the injections, the pain will likely return.  It takes 3-7 days for steroids to work in the sacroiliac area.  You may not notice any pain relief for at least that one week.  If effective, we will often do a series of three injections spaced 3-6 weeks apart to maximally decrease your pain.  After the initial series, we generally will wait some months before a repeat injection of the same type.  If you have any questions, please call [phone] [facility]   Facet Joint Block The facet joints connect the bones of the spine (vertebrae). They make it possible for you to bend, twist, and make other movements with your spine. They also keep you from bending too far, twisting too far, and making other excessive movements. A facet joint block is a procedure where a numbing medicine (anesthetic) is injected into a facet joint. Often, a type of anti-inflammatory medicine called a steroid is also injected. A facet joint block may be done to diagnose neck or back pain. If the pain gets better after a facet joint block, it means the pain is probably  coming from the facet joint. If the pain does not get better, it means the pain is probably not coming from the facet joint. A facet joint block may also be done to relieve neck or back pain caused by an inflamed facet joint. A facet joint block is only done to relieve pain if the pain does not improve with other methods, such  as medicine, exercise programs, and physical therapy. Tell a health care provider about:  Any allergies you have.  All medicines you are taking, including vitamins, herbs, eye drops, creams, and over-the-counter medicines.  Any problems you or family members have had with anesthetic medicines.  Any blood disorders you have.  Any surgeries you have had.  Any medical conditions you have.  Whether you are pregnant or may be pregnant. What are the risks? Generally, this is a safe procedure. However, problems may occur, including:  Bleeding.  Injury to a nerve near the injection site.  Pain at the injection site.  Weakness or numbness in areas controlled by nerves near the injection site.  Infection.  Temporary fluid retention.  Allergic reactions to medicines or dyes.  Injury to other structures or organs near the injection site.  What happens before the procedure?  Follow instructions from your health care provider about eating or drinking restrictions.  Ask your health care provider about: ? Changing or stopping your regular medicines. This is especially important if you are taking diabetes medicines or blood thinners. ? Taking medicines such as aspirin and ibuprofen. These medicines can thin your blood. Do not take these medicines before your procedure if your health care provider instructs you not to.  Do not take any new dietary supplements or medicines without asking your health care provider first.  Plan to have someone take you home after the procedure. What happens during the procedure?  You may need to remove your clothing and dress in an  open-back gown.  The procedure will be done while you are lying on an X-ray table. You will most likely be asked to lie on your stomach, but you may be asked to lie in a different position if an injection will be made in your neck.  Machines will be used to monitor your oxygen levels, heart rate, and blood pressure.  If an injection will be made in your neck, an IV tube will be inserted into one of your veins. Fluids and medicine will flow directly into your body through the IV tube.  The area over the facet joint where the injection will be made will be cleaned with soap. The surrounding skin will be covered with clean drapes.  A numbing medicine (local anesthetic) will be applied to your skin. Your skin may sting or burn for a moment.  A video X-ray machine (fluoroscopy) will be used to locate the joint. In some cases, a CT scan may be used.  A contrast dye may be injected into the facet joint area to help locate the joint.  When the joint is located, an anesthetic will be injected into the joint through the needle.  Your health care provider will ask you whether you feel pain relief. If you do feel relief, a steroid may be injected to provide pain relief for a longer period of time. If you do not feel relief or feel only partial relief, additional injections of an anesthetic may be made in other facet joints.  The needle will be removed.  Your skin will be cleaned.  A bandage (dressing) will be applied over each injection site. The procedure may vary among health care providers and hospitals. What happens after the procedure?  You will be observed for 15-30 minutes before being allowed to go home. This information is not intended to replace advice given to you by your health care provider. Make sure you discuss any questions you have with your health care  provider. Document Released: 03/05/2007 Document Revised: 11/15/2015 Document Reviewed: 07/10/2015 [employer] Interactive Patient  Education  [employer].

## 2017-10-13 NOTE — Progress Notes (Signed)
Patient's Name: Karen Edwards  MRN: [id number]  Referring Provider: Pleas Koch, NP  DOB: 09-21-1966  PCP: Karen Koch, NP  DOS: 10/13/2017  Note by: Karen Cola, MD  Service setting: Ambulatory outpatient  Specialty: Interventional Pain Management  Location: [facility] (AMB) Pain Management Facility    Patient type: Established   Primary Reason(s) for Visit: Encounter for evaluation before starting new chronic pain management plan of care (Level of risk: moderate) CC: Back Pain (she also has neck pain.)  HPI  Karen Edwards is a 51 y.o. year old, female patient, who comes today for a follow-up evaluation to review the test results and decide on a treatment plan. She has Chronic low back pain (Primary Area of Pain) (Bilateral) (R>L); Menorrhagia; Fatigue; DDD (degenerative disc disease), lumbar; DDD (degenerative disc disease), cervical; Chronic occipital neuralgia (Fifth Area of Pain) (Bilateral) (R>L); Lumbar facet syndrome (Bilateral) (L>R); DDD (degenerative disc disease), lumbosacral; Lumbar radicular pain (Right) (L4); Sacroiliac joint dysfunction (Bilateral); Depression; Rectal bleeding; Insomnia; Chronic constipation; Pharmacologic therapy; Hyperlipidemia, unspecified; Chronic neck pain (Fourth Area of Pain) (Bilateral) (R>L); GERD (gastroesophageal reflux disease); Numbness and tingling of upper extremity (Right); Chronic upper extremity pain (Bilateral) (R>L); Chronic sacroiliac joint pain (Bilateral) (L>R); Lumbar facet hypertrophy (Bilateral); L1-2 disc extrusion (Right); Cervical foraminal stenosis (C5-6) (Bilateral); Long term prescription opiate use; Opiate use; Disorder of skeletal system; Problems influencing health status; Chronic pain syndrome; Chronic cervical radicular pain (Right: C6/C7) (Left: C5/T1); Chronic lumbar radicular pain; Chronic hip pain (Tertiary Area of Pain) (Bilateral) (R>L); Chronic lower extremity pain (Secondary Area of Pain) (Bilateral) (R>L);  Chronic shoulder pain (Bilateral) (L>R); and Chronic musculoskeletal pain on their problem list. Her primarily concern today is the Back Pain (she also has neck pain.)  Pain Assessment: Location: Right Back Radiating: The pain is down the right leg to the foot and both hips. Onset: More than a month ago Duration: Chronic pain Quality: Aching, Sharp, Burning, Radiating, Tingling, Constant Severity: 3 /10 (self-reported pain score)  Note: Reported level is compatible with observation.                         When using our objective Pain Scale, levels between 6 and 10/10 are said to belong in an emergency room, as it progressively worsens from a 6/10, described as severely limiting, requiring emergency care not usually available at an outpatient pain management facility. At a 6/10 level, communication becomes difficult and requires great effort. Assistance to reach the emergency department may be required. Facial flushing and profuse sweating along with potentially dangerous increases in heart rate and blood pressure will be evident. Effect on ADL: pace self. Timing: Constant Modifying factors: Changing positions.  Karen Edwards comes in today for a follow-up visit after her initial evaluation on 09/08/2017. Today we went over the results of her tests. These were explained in "Layman's terms". During today's appointment we went over my diagnostic impression, as well as the proposed treatment plan.  According to the patient the primary area of pain is that of the lower back, bilaterally, with the right side being worse than the left.  The patient denies any prior surgeries but does admit to having had a relatively recent MRI.  She does admit to having had some nerve blocks done by Karen Edwards, some of which helped in some that did not.  The patient does admit having had some physical therapy years ago which did not help and in fact it  made pain worse.  The second area of pain is described to be that of  the lower extremities, bilaterally, with the right being worse than the left.  In the case of the right lower extremity the pain goes down through the lateral aspect of the leg to the area below the knee.  She denies any physical therapy, surgery, x-rays, or nerve conduction test.  In the case of the left lower extremity pain goes down through the posterolateral aspect of the leg to just below the buttocks.  Again she denies any surgery, physical therapy, x-rays, or nerve conduction test.  At the next area pain is that of the hips, bilaterally, with the right being worse than the left.  She denies any hip surgery, x-rays, physical therapy, or nerve blocks.  She also denies any joint injections in the area.  The next area of pain is described to be that of the neck, through the posterior aspect of the neck, bilaterally, with the right side being worse than the left.  She does admit to having had cervical spine surgery x2 with the first 1 having been in April 2017 followed by a second 1 around May 28, 2017, both of them done at [facility] by Karen Edwards.  The patient denies any complications and does admit to having had some physical therapy.  She denies any prior nerve blocks or joint injections in the area and she does admit to having had some imaging studies recently, just before the surgery.  The next area of pain is described to be the headaches in the occipital region with headaches that are bilateral with the right being worse than the left.  She denies any surgeries, nerve blocks, joint injections, x-rays, or physical therapy for that pain.  Next is the shoulder pain which is bilateral, with the left being worse than the right.  She also admits to having pain between the shoulder blades bilaterally also with the left being worse than the right.  She denies any surgeries, nerve blocks, joint injections, x-rays, or physical therapy for that area.  The next area  of pain is described to be that of the upper extremities, bilaterally, with the right being worse than the left.  In the case of the right upper extremity the pain goes down to her thumb, index finger, and middle fingers with some weakness where she drops things.  This seems to follow a C6/C7 dermatomal distribution.  In the case of the left upper extremity, she complains primarily of a tingling sensation that does not reach to hand.  Finally, she complains of bilateral feet pain, worse on the right when compared to the left.  She describes this as a burning sensation but she denies any diabetes, nerve conduction test, or neuropathies.  The pain is described to go to the top of the foot, bilaterally, and what seems to be an L5 dermatomal distribution.  Physical exam today was positive for bilateral lumbar facet pain with the right being worse than the left, upon provocative hyperextension and rotation of the lumbar spine.  In addition, the patient had a positive Patrick maneuver test, bilaterally, with pain being referred towards the hip and the SI joint, bilaterally, with the right being worse than the left in both instances.  In considering the treatment plan options, Ms. Barra was reminded that I no longer take patients for medication management only. I asked her to let me know if she had no intention of taking  advantage of the interventional therapies, so that we could make arrangements to provide this space to someone interested. I also made it clear that undergoing interventional therapies for the purpose of getting pain medications is very inappropriate on the part of a patient, and it will not be tolerated in this practice. This type of behavior would suggest true addiction and therefore it requires referral to an addiction specialist.   Further details on both, my assessment(s), as well as the proposed treatment plan, please see below.  Controlled Substance Pharmacotherapy Assessment REMS  (Risk Evaluation and Mitigation Strategy)  Analgesic: Hydrocodone/APAP 5/325 1 tablet p.o. twice daily Highest recorded MME/day: 83.33 mg/day MME/day: 10 mg/day Pill Count: None expected due to no prior prescriptions written by our practice. Charna Busman, NT  10/13/2017  8:51 AM  Sign at close encounter Safety precautions to be maintained throughout the outpatient stay will include: orient to surroundings, keep bed in low position, maintain call bell within reach at all times, provide assistance with transfer out of bed and ambulation.    Pharmacokinetics: Liberation and absorption (onset of action): WNL Distribution (time to peak effect): WNL Metabolism and excretion (duration of action): WNL         Pharmacodynamics: Desired effects: Analgesia: Ms. Hoadley reports >50% benefit. Functional ability: Patient reports that medication allows her to accomplish basic ADLs Clinically meaningful improvement in function (CMIF): Sustained CMIF goals met Perceived effectiveness: Described as relatively effective, allowing for increase in activities of daily living (ADL) Undesirable effects: Side-effects or Adverse reactions: None reported Monitoring:  PMP: Online review of the past 70-monthperiod previously conducted. Not applicable at this point since we have not taken over the patient's medication management yet. List of other Serum/Urine Drug Screening Test(s):  Lab Results  Component Value Date   COCAINSCRNUR NONE DETECTED 01/30/2017   THCU NONE DETECTED 01/30/2017   List of all UDS test(s) done:  Lab Results  Component Value Date   TOXASSSELUR FINAL 03/12/2016   SUMMARY FINAL 09/08/2017   Last UDS on record: ToxAssure Select 13  Date Value Ref Range Status  03/12/2016 FINAL  Final    Comment:    ==================================================================== TOXASSURE SELECT 13 (MW) ==================================================================== Test                              Result       Flag       Units Drug Present and Declared for Prescription Verification   Oxycodone                      478          EXPECTED   ng/mg creat   Noroxycodone                   4690         EXPECTED   ng/mg creat    Sources of oxycodone include scheduled prescription medications.    Noroxycodone is an expected metabolite of oxycodone. Drug Absent but Declared for Prescription Verification   Hydrocodone                    Not Detected UNEXPECTED ng/mg creat ==================================================================== Test                      Result    Flag   Units      Ref Range   Creatinine  86               mg/dL      >=20 ==================================================================== Declared Medications:  The flagging and interpretation on this report are based on the  following declared medications.  Unexpected results may arise from  inaccuracies in the declared medications.  **Note: The testing scope of this panel includes these medications:  Hydrocodone (Norco)  Oxycodone (Oxy-IR)  **Note: The testing scope of this panel does not include following  reported medications:  Acetaminophen (Norco)  Baclofen (Lioresal)  Cyanocobalamin  Fluoxetine (Prozac)  Linaclotide (Linzess)  Medroxyprogesterone (Provera)  Trazodone (Desyrel) ==================================================================== For clinical consultation, please call [phone]. ====================================================================    Summary  Date Value Ref Range Status  09/08/2017 FINAL  Final    Comment:    ==================================================================== TOXASSURE COMP DRUG ANALYSIS,UR ==================================================================== Test                             Result       Flag       Units Drug Present   Zolpidem Acid                  PRESENT    Zolpidem acid is an expected metabolite of  zolpidem.   Fluoxetine                     PRESENT   Norfluoxetine                  PRESENT    Norfluoxetine is an expected metabolite of fluoxetine.   Trazodone                      PRESENT   1,3 chlorophenyl piperazine    PRESENT    1,3-chlorophenyl piperazine is an expected metabolite of    trazodone. ==================================================================== Test                      Result    Flag   Units      Ref Range   Creatinine              56               mg/dL      >=20 ==================================================================== Declared Medications:  Medication list was not provided. ==================================================================== For clinical consultation, please call [phone]. ====================================================================    UDS interpretation: No unexpected findings.          Medication Assessment Form: Patient introduced to form today Treatment compliance: Treatment may start today if patient agrees with proposed plan. Evaluation of compliance is not applicable at this point Risk Assessment Profile: Aberrant behavior: See initial evaluations. None observed or detected today Comorbid factors increasing risk of overdose: See initial evaluation. No additional risks detected today Medical Psychology Evaluation: Low Risk Opioid Risk Tool - 10/13/17 0847      Family History of Substance Abuse   Alcohol  Negative    Illegal Drugs  Negative    Rx Drugs  Negative      Personal History of Substance Abuse   Alcohol  Negative    Illegal Drugs  Negative    Rx Drugs  Negative      Age   Age between 24-45 years   No      History of Preadolescent Sexual Abuse   History of Preadolescent Sexual Abuse  Negative or Female  Psychological Disease   Psychological Disease  Negative    Depression  Negative      Total Score   Opioid Risk Tool Scoring  0    Opioid Risk Interpretation  Low Risk      ORT  Scoring interpretation table:  Score <3 = Low Risk for SUD  Score between 4-7 = Moderate Risk for SUD  Score >8 = High Risk for Opioid Abuse   Risk Mitigation Strategies:  Patient opioid safety counseling: Completed today. Counseling provided to patient as per "Patient Counseling Document". Document signed by patient, attesting to counseling and understanding Patient-Prescriber Agreement (PPA): Obtained today.  Controlled substance notification to other providers: Written and sent today.  Pharmacologic Plan: Today we may be taking over the patient's pharmacological regimen. See below From this point on, Ms. Glomski's medication agreement should be considered active.  Laboratory Chemistry  Inflammation Markers (CRP: Acute Phase) (ESR: Chronic Phase) Lab Results  Component Value Date   CRP 1.4 09/08/2017   ESRSEDRATE 11 09/08/2017                 Rheumatology Markers No results found for: RF, ANA, Therisa Doyne, [facility]              Renal Function Markers Lab Results  Component Value Date   BUN 7 09/08/2017   CREATININE 0.84 09/08/2017   GFRAA 93 09/08/2017   GFRNONAA 81 09/08/2017                 Hepatic Function Markers Lab Results  Component Value Date   AST 18 09/08/2017   ALT 12 (L) 01/30/2017   ALBUMIN 4.7 09/08/2017   ALKPHOS 73 09/08/2017                 Electrolytes Lab Results  Component Value Date   NA 139 09/08/2017   K 4.4 09/08/2017   CL 99 09/08/2017   CALCIUM 9.8 09/08/2017   MG 2.1 09/08/2017                 Neuropathy Markers Lab Results  Component Value Date   VITAMINB12 >2000 (H) 09/08/2017                 Bone Pathology Markers Lab Results  Component Value Date   VD25OH 43.74 01/10/2016   25OHVITD1 51 09/08/2017   25OHVITD2 <1.0 09/08/2017   25OHVITD3 51 09/08/2017                 Coagulation Parameters Lab Results  Component Value Date   PLT 202.0 05/20/2017                 Cardiovascular Markers Lab  Results  Component Value Date   TROPONINI <0.03 01/30/2017   HGB 13.5 05/20/2017   HCT 40.5 05/20/2017                 CA Markers No results found for: CEA, CA125, LABCA2               Note: Lab results reviewed.  Recent Diagnostic Imaging Review  Cervical Imaging: Cervical MR wo contrast:  Results for orders placed during the hospital encounter of 10/16/15  MR Cervical Spine Wo Contrast   Narrative CLINICAL DATA:  Neck pain radiating to both shoulders with hand numbness.  EXAM: MRI CERVICAL SPINE WITHOUT CONTRAST  TECHNIQUE: Multiplanar, multisequence MR imaging of the cervical spine was performed. No intravenous contrast was administered.  COMPARISON:  MRI 10/13/2013.  CT 03/23/2014.  FINDINGS: The study suffers from some motion degradation.  There is no abnormality at the foramen magnum, C1-2, C2-3 or C3-4.  C4-5: Disc degeneration with shallow disc protrusion that indents the thecal sac but does not appear to compress the cord or show foraminal extension.  C5-6: Spondylosis with endplate osteophytes and bulging of the disc. Narrowing of the subarachnoid space but no compression of the cord. Moderate foraminal narrowing, right more than left.  C6-7: Spondylosis with endplate osteophytes and shallow broad-based herniation of disc material. Effacement of the ventral subarachnoid space with AP diameter of the canal only 8 mm. No foraminal narrowing.  C7-T1:  Normal interspace.  IMPRESSION: Motion degraded study.  C4-5: Shallow disc protrusion that indents the thecal sac but does not appear to cause neural compression.  C5-6: Spondylosis with narrowing of the ventral subarachnoid space and bilateral neural foraminal stenosis that could affect either or both C6 nerve roots.  C6-7: Broad-based disc herniation narrows the spinal canal but does not compress the cord or show foraminal extension.  C5-6 and C6-7 appear similar to the previous studies. C4-5  appears to have worsened over time.   Electronically Signed   By: Nelson Chimes M.D.   On: 10/16/2015 14:23    Thoracic Imaging: Thoracic DG w/swimmers view:  Results for orders placed in visit on 05/05/17  [facility] Thoracic Spine W/Swimmers   Narrative CLINICAL DATA:  Chronic right chest wall pain.  No known injury.  EXAM: THORACIC SPINE - 3 VIEWS  COMPARISON:  Chest x-ray 01/30/2017  FINDINGS: There is no evidence of thoracic spine fracture. Alignment is normal. No other significant bone abnormalities are identified.  IMPRESSION: Negative.   Electronically Signed   By: Rolm Baptise M.D.   On: 05/06/2017 08:12    Lumbosacral Imaging: Lumbar MR wo contrast:  Results for orders placed during the hospital encounter of 02/20/16  MR Lumbar Spine Wo Contrast   Narrative CLINICAL DATA:  Low back pain with bilateral hip and leg pain, worse on the right. Symptoms for 2 years, worsening over the last 6 months. No acute injury or prior relevant surgery.  EXAM: MRI LUMBAR SPINE WITHOUT CONTRAST  TECHNIQUE: Multiplanar, multisequence MR imaging of the lumbar spine was performed. No intravenous contrast was administered.  COMPARISON:  Radiographs 03/23/2014.  Lumbar MRI 09/29/2014.  FINDINGS: Segmentation: Conventional anatomy assumed, with the last open disc space designated L5-S1.  Alignment:  Normal.  Bones: No worrisome osseous lesion, acute fracture or pars defect. The visualized sacroiliac joints appear unremarkable.  Conus medullaris: Extends to the upper L1 level and appears normal.  Paraspinal and other soft tissues: No significant paraspinal findings.  Disc levels:  L1-2: Stable small right paracentral disc extrusion with mild cephalad extension of disc material behind the L1 vertebral body. No mass effect on the conus medullaris or exiting nerve roots.  L2-3: Mild disc bulging. No spinal stenosis or nerve root encroachment.  L3-4: Mild disc bulging and  facet hypertrophy. No spinal stenosis or nerve root encroachment.  L4-5: Mild disc bulging and facet hypertrophy. No spinal stenosis or nerve root encroachment.  L5-S1: Disc height and hydration are maintained. Mild bilateral facet hypertrophy. No spinal stenosis or nerve root encroachment.  IMPRESSION: 1. Stable mild degenerative changes throughout the lumbar spine with disc bulging and facet hypertrophy. 2. Stable small right paracentral disc extrusion at L1-2 without resulting nerve root encroachment. 3. No acute findings.   Electronically Signed   By: Caryl Comes.D.  On: 02/20/2016 10:44    Lumbar MR w/wo contrast:  Results for orders placed during the hospital encounter of 12/03/12  MR Lumbar Spine W Wo Contrast   Narrative *RADIOLOGY REPORT*  Clinical Data: Chronic low back pain with left sided radicular symptoms.  No reported relevant prior surgery.  MRI LUMBAR SPINE WITHOUT AND WITH CONTRAST  Technique:  Multiplanar and multiecho pulse sequences of the lumbar spine were obtained without and with intravenous contrast.  Contrast: 76m MULTIHANCE GADOBENATE DIMEGLUMINE 529 MG/ML IV SOLN  Comparison: Lumbar spine radiographs 10/22/2012.  Findings: Radiographs demonstrate five lumbar type vertebral bodies.  The alignment is normal.  There is no evidence of fracture or pars defect.  Scattered tiny hemangiomas are noted.  The conus medullaris extends to the T12-L1 level and appears normal. There are no paraspinal abnormalities. Sagittal images demonstrate probable retroversion of the uterus.  There is a small subserosal uterine fibroid as well as central low density in the uterus which may reflect a larger fibroid.  This is only partially imaged on the current examination.  L1-L2:  Small right paracentral disc protrusion.  No spinal stenosis or nerve root encroachment.  L2-L3:  Mild disc bulging.  No spinal stenosis or nerve root encroachment.  L3-L4:   Disc height and hydration are maintained.  There is mild facet and ligamentous hypertrophy.  No spinal stenosis or nerve root encroachment.  L4-L5:  Mild disc bulging with mild facet and ligamentous hypertrophy.  No spinal stenosis or nerve root encroachment.  L5-S1:  Disc height and hydration are maintained.  No spinal stenosis or nerve root encroachment.  IMPRESSION:  1.  Small right paracentral disc protrusion at L1-L2. 2.  Mild disc bulging and facet disease from L2-L3 through L4-L5. 3.  No significant spinal stenosis or nerve root encroachment. 4.  Suspected uterine fibroids, only partially imaged on the current examination.   Original Report Authenticated By: WRichardean Sale M.D.    Lumbar DG (Complete) 4+V:  Results for orders placed during the hospital encounter of 10/22/12  DG Lumbar Spine Complete   Narrative *RADIOLOGY REPORT*  Clinical Data: Back pain.  LUMBAR SPINE - COMPLETE 4+ VIEW  Comparison: None  Findings: The lateral film demonstrates normal alignment. Vertebral bodies and disc spaces are maintained.  No acute bony findings.  Normal alignment of the facet joints and no pars defects.  The visualized bony pelvis in intact.  IMPRESSION: Normal alignment and no acute bony findings or degenerative changes.   Original Report Authenticated By: PMarijo Sanes M.D.    Lumbar DG Bending views:  Results for orders placed during the hospital encounter of 09/08/17  DG Lumbar Spine Complete W/Bend   Narrative CLINICAL DATA:  Chronic pain, pain clinic referral.  EXAM: LUMBAR SPINE - COMPLETE WITH BENDING VIEWS  COMPARISON:  Lumbar spine MRI of February 20, 2016  FINDINGS: The lumbar vertebral bodies are preserved in height. The pedicles and transverse processes are intact. The disc space heights are well maintained. There is no spondylolisthesis. There is no significant facet joint hypertrophy. There is no evidence of ligamentous instability as the  patient moves from the neutral to the flexed and to the extended positions. The observed portions of the sacrum are normal.  IMPRESSION: There is no acute or significant chronic bony abnormality of the lumbar spine.   Electronically Signed   By: David  JMartiniqueM.D.   On: 09/08/2017 15:40    Sacroiliac Joint Imaging: Sacroiliac Joint DG:  Results for orders placed during the hospital  encounter of 09/08/17  DG Si Joints   Narrative CLINICAL DATA:  Chronic pain in multiple areas of the body. No injury.  EXAM: BILATERAL SACROILIAC JOINTS - 3+ VIEW  COMPARISON:  None.  FINDINGS: The sacroiliac joint spaces are maintained and there is no evidence of arthropathy. No other bone abnormalities are seen.  IMPRESSION: Negative.   Electronically Signed   By: Staci Righter M.D.   On: 09/08/2017 15:42    Hip Imaging: Hip-R DG 2-3 views:  Results for orders placed during the hospital encounter of 09/08/17  DG HIP UNILAT W OR W/O PELVIS 2-3 VIEWS RIGHT   Narrative CLINICAL DATA:  Chronic pelvic and hip pain. No report of injury. Pain center referral.  EXAM: DG HIP (WITH OR WITHOUT PELVIS) 2-3V RIGHT; DG HIP (WITH OR WITHOUT PELVIS) 2-3V LEFT  COMPARISON:  None in PACs  FINDINGS: The bony pelvis is subjectively adequately mineralized. There is no lytic or blastic lesion. The sacrum and SI joints appear normal.  AP and lateral views of both hips reveal preservation of the joint spaces. The articular surfaces of the femoral heads and acetabuli remains smoothly rounded. The femoral necks, intertrochanteric, and subtrochanteric regions are normal. The overlying soft tissues are normal.  IMPRESSION: There is no acute or significant chronic bony abnormality of the pelvis or hips.   Electronically Signed   By: David  Martinique M.D.   On: 09/08/2017 15:42    Hip-L DG 2-3 views:  Results for orders placed during the hospital encounter of 09/08/17  DG HIP UNILAT W OR W/O  PELVIS 2-3 VIEWS LEFT   Narrative CLINICAL DATA:  Chronic pelvic and hip pain. No report of injury. Pain center referral.  EXAM: DG HIP (WITH OR WITHOUT PELVIS) 2-3V RIGHT; DG HIP (WITH OR WITHOUT PELVIS) 2-3V LEFT  COMPARISON:  None in PACs  FINDINGS: The bony pelvis is subjectively adequately mineralized. There is no lytic or blastic lesion. The sacrum and SI joints appear normal.  AP and lateral views of both hips reveal preservation of the joint spaces. The articular surfaces of the femoral heads and acetabuli remains smoothly rounded. The femoral necks, intertrochanteric, and subtrochanteric regions are normal. The overlying soft tissues are normal.  IMPRESSION: There is no acute or significant chronic bony abnormality of the pelvis or hips.   Electronically Signed   By: David  Martinique M.D.   On: 09/08/2017 15:42    Complexity Note: Imaging results reviewed. Results shared with Ms. Tye Savoy, using [employer].                         Meds   Current Outpatient Medications:  .  Cyanocobalamin (VITAMIN B-12) 5000 MCG TBDP, Take 2,500 mcg by mouth daily. , Disp: , Rfl:  .  FLUoxetine (PROZAC) 20 MG capsule, TAKE 3 CAPSULES BY MOUTH ONCE DAILY, Disp: 90 capsule, Rfl: 0 .  Potassium 99 MG TABS, Take 1 tablet by mouth daily., Disp: , Rfl:  .  traZODone (DESYREL) 100 MG tablet, TAKE 3 TABLETS BY MOUTH AT BEDTIME, Disp: 90 tablet, Rfl: 0 .  HYDROcodone-acetaminophen (NORCO/VICODIN) 5-325 MG tablet, Take 1 tablet by mouth 2 (two) times daily., Disp: 60 tablet, Rfl: 0 .  tiZANidine (ZANAFLEX) 4 MG capsule, Take 1 capsule (4 mg total) by mouth 3 (three) times daily as needed for muscle spasms., Disp: 90 capsule, Rfl: 2  ROS  Constitutional: Denies any fever or chills Gastrointestinal: No reported hemesis, hematochezia, vomiting, or acute GI  distress Musculoskeletal: Denies any acute onset joint swelling, redness, loss of ROM, or weakness Neurological: No reported episodes of  acute onset apraxia, aphasia, dysarthria, agnosia, amnesia, paralysis, loss of coordination, or loss of consciousness  Allergies  Ms. Freer is allergic to no known allergies.  [place]  Drug: Ms. Mcqueary  reports that she does not use drugs. Alcohol:  reports that she drinks about 0.6 oz of alcohol per week. Tobacco:  reports that she has been smoking cigarettes.  She has a 3.70 pack-year smoking history. she has never used smokeless tobacco. Medical:  has a past medical history of Anxiety, Chronic back pain, Degenerative joint disease, Depression, Hypotension, IBS (irritable bowel syndrome) (05/2015), and MVP (mitral valve prolapse). Surgical: Ms. Alatorre  has a past surgical history that includes Cesarean section; Ankle surgery (Right, 03/02/2014); Hemorrhoid surgery (N/A, 06/16/2015); Cervical spine surgery (02/21/2016); Cesarean section with bilateral tubal ligation (1990); Colonoscopy (04/19/2015); and Hysteroscopy w/D&C (12/24/2013). Family: family history includes Arthritis in her mother; Congestive Heart Failure in her maternal grandfather; Depression in her mother; Diabetes in her maternal aunt; Diabetes Mellitus I in her maternal grandmother; Gout in her maternal aunt; Hearing loss in her father; Heart attack in her maternal grandfather; Hyperlipidemia in her father and mother; Hypertension in her father and mother; Hypothyroidism in her mother; Kidney disease in her mother; Prostate cancer in her paternal grandfather; Stroke in her maternal grandmother; Thyroid disease in her maternal aunt; Vision loss in her mother.  Constitutional Exam  General appearance: Well nourished, well developed, and well hydrated. In no apparent acute distress Vitals:   10/13/17 0838  BP: 122/79  Pulse: 87  Resp: 16  Temp: 98.6 F (37 C)  TempSrc: Oral  SpO2: 100%  Weight: 140 lb (63.5 kg)  Height: _0  (1.549 m)   BMI Assessment: Estimated body mass index is 26.45 kg/m as calculated from the  following:   Height as of this encounter: _1  (1.549 m).   Weight as of this encounter: 140 lb (63.5 kg).  BMI interpretation table: BMI level Category Range association with higher incidence of chronic pain  <18 kg/m2 Underweight   18.5-24.9 kg/m2 Ideal body weight   25-29.9 kg/m2 Overweight Increased incidence by 20%  30-34.9 kg/m2 Obese (Class I) Increased incidence by 68%  35-39.9 kg/m2 Severe obesity (Class II) Increased incidence by 136%  >40 kg/m2 Extreme obesity (Class III) Increased incidence by 254%   BMI Readings from Last 4 Encounters:  10/13/17 26.45 kg/m  09/08/17 26.45 kg/m  05/20/17 27.59 kg/m  05/15/17 26.45 kg/m   Wt Readings from Last 4 Encounters:  10/13/17 140 lb (63.5 kg)  09/08/17 140 lb (63.5 kg)  05/20/17 146 lb (66.2 kg)  05/15/17 140 lb (63.5 kg)  Psych/Mental status: Alert, oriented x 3 (person, place, & time)       Eyes: PERLA Respiratory: No evidence of acute respiratory distress  Cervical Spine Area Exam  Skin & Axial Inspection: No masses, redness, edema, swelling, or associated skin lesions Alignment: Symmetrical Functional ROM: Decreased ROM      Stability: No instability detected Muscle Tone/Strength: Functionally intact. No obvious neuro-muscular anomalies detected. Sensory (Neurological): Movement-associated pain Palpation: Complains of area being tender to palpation              Upper Extremity (UE) Exam    Side: Right upper extremity  Side: Left upper extremity  Skin & Extremity Inspection: Skin color, temperature, and hair growth are WNL. No peripheral edema or cyanosis. No masses, redness,  swelling, asymmetry, or associated skin lesions. No contractures.  Skin & Extremity Inspection: Skin color, temperature, and hair growth are WNL. No peripheral edema or cyanosis. No masses, redness, swelling, asymmetry, or associated skin lesions. No contractures.  Functional ROM: Unrestricted ROM          Functional ROM: Unrestricted ROM           Muscle Tone/Strength: Functionally intact. No obvious neuro-muscular anomalies detected.  Muscle Tone/Strength: Functionally intact. No obvious neuro-muscular anomalies detected.  Sensory (Neurological): Unimpaired          Sensory (Neurological): Unimpaired          Palpation: No palpable anomalies              Palpation: No palpable anomalies              Specialized Test(s): Deferred         Specialized Test(s): Deferred          Thoracic Spine Area Exam  Skin & Axial Inspection: No masses, redness, or swelling Alignment: Symmetrical Functional ROM: Unrestricted ROM Stability: No instability detected Muscle Tone/Strength: Functionally intact. No obvious neuro-muscular anomalies detected. Sensory (Neurological): Unimpaired Muscle strength & Tone: No palpable anomalies  Lumbar Spine Area Exam  Skin & Axial Inspection: No masses, redness, or swelling Alignment: Symmetrical Functional ROM: Unrestricted ROM      Stability: No instability detected Muscle Tone/Strength: Functionally intact. No obvious neuro-muscular anomalies detected. Sensory (Neurological): Unimpaired Palpation: No palpable anomalies       Provocative Tests: Lumbar Hyperextension and rotation test: evaluation deferred today       Lumbar Lateral bending test: evaluation deferred today       Patrick's Maneuver: evaluation deferred today                    Gait & Posture Assessment  Ambulation: Unassisted Gait: Relatively normal for age and body habitus Posture: WNL   Lower Extremity Exam    Side: Right lower extremity  Side: Left lower extremity  Skin & Extremity Inspection: Skin color, temperature, and hair growth are WNL. No peripheral edema or cyanosis. No masses, redness, swelling, asymmetry, or associated skin lesions. No contractures.  Skin & Extremity Inspection: Skin color, temperature, and hair growth are WNL. No peripheral edema or cyanosis. No masses, redness, swelling, asymmetry, or associated skin  lesions. No contractures.  Functional ROM: Unrestricted ROM          Functional ROM: Unrestricted ROM          Muscle Tone/Strength: Functionally intact. No obvious neuro-muscular anomalies detected.  Muscle Tone/Strength: Functionally intact. No obvious neuro-muscular anomalies detected.  Sensory (Neurological): Unimpaired  Sensory (Neurological): Unimpaired  Palpation: No palpable anomalies  Palpation: No palpable anomalies   Assessment & Plan  Primary Diagnosis & Pertinent Problem List: The primary encounter diagnosis was Chronic low back pain (Primary Area of Pain) (Bilateral) (R>L). Diagnoses of Chronic lower extremity pain (Secondary Area of Pain) (Bilateral) (R>L), Chronic hip pain (Tertiary Area of Pain) (Bilateral) (R>L), Chronic neck pain (Fourth Area of Pain) (Bilateral) (R>L), Chronic occipital neuralgia (Fifth Area of Pain) (Bilateral) (R>L), Chronic pain syndrome, Lumbar facet syndrome (Bilateral) (L>R), Lumbar facet hypertrophy (Bilateral), Chronic sacroiliac joint pain (Bilateral) (L>R), Chronic musculoskeletal pain, Long term prescription opiate use, and Opiate use were also pertinent to this visit.  Visit Diagnosis: 1. Chronic low back pain (Primary Area of Pain) (Bilateral) (R>L)   2. Chronic lower extremity pain (Secondary Area of Pain) (Bilateral) (R>L)  3. Chronic hip pain (Tertiary Area of Pain) (Bilateral) (R>L)   4. Chronic neck pain (Fourth Area of Pain) (Bilateral) (R>L)   5. Chronic occipital neuralgia (Fifth Area of Pain) (Bilateral) (R>L)   6. Chronic pain syndrome   7. Lumbar facet syndrome (Bilateral) (L>R)   8. Lumbar facet hypertrophy (Bilateral)   9. Chronic sacroiliac joint pain (Bilateral) (L>R)   10. Chronic musculoskeletal pain   11. Long term prescription opiate use   12. Opiate use    Problems updated and reviewed during this visit: Problem  Chronic Musculoskeletal Pain  Hyperlipidemia, Unspecified   Overview:  Last Assessment & Plan:  TC,  Trigs, LDL above goal.  She would like to work on improvements in diet and exercise. Start Fish Oil 100 mg daily with meals. Recheck lipids in 4 months, if above goal then will initiate treatment.  Overview:  Last Assessment & Plan:  TC, Trigs, LDL above goal.  She would like to work on improvements in diet and exercise. Start Fish Oil 100 mg daily with meals. Recheck lipids in 4 months, if above goal then will initiate treatment.     Plan of Care  Pharmacotherapy (Medications Ordered): Meds ordered this encounter  Medications  . tiZANidine (ZANAFLEX) 4 MG capsule    Sig: Take 1 capsule (4 mg total) by mouth 3 (three) times daily as needed for muscle spasms.    Dispense:  90 capsule    Refill:  2    Do not place medication on "Automatic Refill". Fill one day early if pharmacy is closed on scheduled refill date.  Marland Kitchen HYDROcodone-acetaminophen (NORCO/VICODIN) 5-325 MG tablet    Sig: Take 1 tablet by mouth 2 (two) times daily.    Dispense:  60 tablet    Refill:  0    Fill one day early if pharmacy is closed on scheduled refill date. Do not fill until: 10/13/17 To last until: 11/12/17    Procedure Orders     LUMBAR FACET(MEDIAL BRANCH NERVE BLOCK) MBNB     SACROILIAC JOINT INJECTION Lab Orders  No laboratory test(s) ordered today   Imaging Orders  No imaging studies ordered today   Referral Orders  No referral(s) requested today    Pharmacological management options:  Opioid Analgesics: We'll take over management today. See above orders Membrane stabilizer: We have discussed the possibility of optimizing this mode of therapy, if tolerated Muscle relaxant: We have discussed the possibility of a trial NSAID: We have discussed the possibility of a trial Other analgesic(s): To be determined at a later time   Interventional management options: Planned, scheduled, and/or pending:    Diagnostic bilateral lumbar facet block + diagnostic bilateral sacroiliac joint block under  fluoroscopic guidance and IV sedation    Considering:   Diagnostic bilateral lumbar facet block  Possible bilateral lumbar facet RFA  Diagnostic bilateral sacroiliac joint block  Possible bilateral sacroiliac joint RFA  Diagnostic bilateral intra-articular hip joint injection  Diagnostic bilateral femoral nerve block + obturator nerve block  Possible bilateral femoral nerve + obturator nerve RFA  Diagnostic right-sided L1-2 lumbar epidural steroid injection  Diagnostic right-sided L1-2 transforaminal epidural steroid injection  Diagnostic bilateral L4-5 lumbar transforaminal epidural steroid injection  Diagnostic bilateral cervical facet block  Possible bilateral cervical facet RFA  Diagnostic right-sided cervical epidural steroid injection  Diagnostic bilateral greater occipital nerve block  Possible bilateral occipital nerve RFA  Diagnostic bilateral intra-articular shoulder joint injection  Diagnostic bilateral suprascapular nerve block  Possible bilateral suprascapular nerve RFA  PRN Procedures:   None at this time   Provider-requested follow-up: Return for Procedure (w/ sedation): (B) L-FCT + (B) SI BLK. Med refills with Crystal from now on. She will F/U with UDS on our new scrip, and progress her to 3 mo intervals as needed.  Future Appointments  Date Time Provider [place]  11/10/2017  1:45 PM Vevelyn Francois, NP [facility] None    Primary Care Physician: Karen Koch, NP Location: [facility] Note by: Karen Cola, MD Date: 10/13/2017; Time: 10:43 AM

## 2017-10-13 NOTE — Progress Notes (Signed)
Safety precautions to be maintained throughout the outpatient stay will include: orient to surroundings, keep bed in low position, maintain call bell within reach at all times, provide assistance with transfer out of bed and ambulation.  

## 2017-10-16 ENCOUNTER — Ambulatory Visit: Payer: PPO | Admitting: Pain Medicine

## 2017-10-20 ENCOUNTER — Other Ambulatory Visit: Payer: Self-pay | Admitting: Primary Care

## 2017-10-20 ENCOUNTER — Encounter: Payer: Self-pay | Admitting: Primary Care

## 2017-10-20 NOTE — Telephone Encounter (Signed)
Ok to refill? Electronically refill request for zolpidem (AMBIEN) 5 MG tablet   Medication have been taken off the current medication list for a while now. Last seen on 05/05/2017

## 2017-10-20 NOTE — Telephone Encounter (Signed)
Will address refill via My Chart.

## 2017-10-22 NOTE — Telephone Encounter (Signed)
Spoke with patient, she stopped taking Ambien and is now back on Trazodone.

## 2017-10-28 ENCOUNTER — Other Ambulatory Visit: Payer: Self-pay | Admitting: Primary Care

## 2017-10-28 DIAGNOSIS — F329 Major depressive disorder, single episode, unspecified: Secondary | ICD-10-CM

## 2017-10-28 DIAGNOSIS — F32A Depression, unspecified: Secondary | ICD-10-CM

## 2017-11-03 ENCOUNTER — Ambulatory Visit: Payer: Self-pay | Admitting: Primary Care

## 2017-11-03 ENCOUNTER — Other Ambulatory Visit: Payer: Self-pay | Admitting: Primary Care

## 2017-11-03 DIAGNOSIS — F329 Major depressive disorder, single episode, unspecified: Secondary | ICD-10-CM

## 2017-11-03 DIAGNOSIS — F32A Depression, unspecified: Secondary | ICD-10-CM

## 2017-11-05 ENCOUNTER — Other Ambulatory Visit: Payer: Self-pay | Admitting: Primary Care

## 2017-11-05 DIAGNOSIS — F329 Major depressive disorder, single episode, unspecified: Secondary | ICD-10-CM

## 2017-11-05 DIAGNOSIS — F32A Depression, unspecified: Secondary | ICD-10-CM

## 2017-11-05 DIAGNOSIS — G4700 Insomnia, unspecified: Secondary | ICD-10-CM

## 2017-11-07 ENCOUNTER — Encounter: Payer: Self-pay | Admitting: Primary Care

## 2017-11-07 ENCOUNTER — Other Ambulatory Visit: Payer: Self-pay | Admitting: Primary Care

## 2017-11-07 DIAGNOSIS — F32A Depression, unspecified: Secondary | ICD-10-CM

## 2017-11-07 DIAGNOSIS — F329 Major depressive disorder, single episode, unspecified: Secondary | ICD-10-CM

## 2017-11-07 DIAGNOSIS — G4700 Insomnia, unspecified: Secondary | ICD-10-CM

## 2017-11-07 MED ORDER — FLUOXETINE HCL 20 MG PO CAPS: 60.0000 mg | ORAL_CAPSULE | Freq: Every day | ORAL | 0 refills | Status: DC

## 2017-11-07 MED ORDER — TRAZODONE HCL 100 MG PO TABS: 300.0000 mg | ORAL_TABLET | Freq: Every day | ORAL | 0 refills | Status: DC

## 2017-11-07 NOTE — Telephone Encounter (Signed)
Routed back to office 

## 2017-11-07 NOTE — Telephone Encounter (Signed)
Patient will need follow-up appointment no later than March 2019 for any additional refills.  Please schedule.

## 2017-11-07 NOTE — Telephone Encounter (Signed)
Pt requesting refill prozac(#90 on 09/24/17) and trazodone(#90 on 09/24/17). Pt last seen 01/10/17 for insomnia. Please advise.

## 2017-11-07 NOTE — Telephone Encounter (Signed)
Copied from [place] [id number]. Topic: Quick Communication - Rx Refill/Question >> Nov 07, 2017  1:32 PM Kaliyan Osbourn, Doylene Canard E, NT wrote: Medication: FLUoxetine (PROZAC) 20 MG capsule and traZODone (DESYREL) 100 MG tablet   Has the patient contacted their pharmacy? yes   (Agent: If no, request that the patient contact the pharmacy for the refill.)   Preferred Pharmacy (with phone number or street name): [facility] [address], [place] - [place] [phone] (Phone) [phone] (Fax)    Agent: Please be advised that RX refills may take up to 3 business days. We ask that you follow-up with your pharmacy.

## 2017-11-10 ENCOUNTER — Encounter: Payer: PPO | Admitting: Nurse Practitioner

## 2017-11-10 ENCOUNTER — Encounter: Payer: Self-pay | Admitting: Nurse Practitioner

## 2017-11-10 ENCOUNTER — Other Ambulatory Visit: Payer: Self-pay

## 2017-11-10 ENCOUNTER — Ambulatory Visit: Payer: PPO | Attending: Nurse Practitioner | Admitting: Nurse Practitioner

## 2017-11-10 VITALS — BP 116/68 | HR 75 | Temp 98.5°F | Resp 16 | Ht 61.0 in | Wt 149.0 lb

## 2017-11-10 DIAGNOSIS — M79605 Pain in left leg: Secondary | ICD-10-CM

## 2017-11-10 DIAGNOSIS — M5116 Intervertebral disc disorders with radiculopathy, lumbar region: Secondary | ICD-10-CM | POA: Diagnosis not present

## 2017-11-10 DIAGNOSIS — M5137 Other intervertebral disc degeneration, lumbosacral region: Secondary | ICD-10-CM | POA: Diagnosis not present

## 2017-11-10 DIAGNOSIS — Z79899 Other long term (current) drug therapy: Secondary | ICD-10-CM | POA: Insufficient documentation

## 2017-11-10 DIAGNOSIS — Z5181 Encounter for therapeutic drug level monitoring: Secondary | ICD-10-CM | POA: Insufficient documentation

## 2017-11-10 DIAGNOSIS — Z9889 Other specified postprocedural states: Secondary | ICD-10-CM | POA: Insufficient documentation

## 2017-11-10 DIAGNOSIS — K219 Gastro-esophageal reflux disease without esophagitis: Secondary | ICD-10-CM | POA: Diagnosis not present

## 2017-11-10 DIAGNOSIS — G8929 Other chronic pain: Secondary | ICD-10-CM | POA: Diagnosis not present

## 2017-11-10 DIAGNOSIS — F329 Major depressive disorder, single episode, unspecified: Secondary | ICD-10-CM | POA: Diagnosis not present

## 2017-11-10 DIAGNOSIS — M25551 Pain in right hip: Secondary | ICD-10-CM

## 2017-11-10 DIAGNOSIS — M25552 Pain in left hip: Secondary | ICD-10-CM | POA: Diagnosis not present

## 2017-11-10 DIAGNOSIS — M7918 Myalgia, other site: Secondary | ICD-10-CM | POA: Diagnosis not present

## 2017-11-10 DIAGNOSIS — M5442 Lumbago with sciatica, left side: Secondary | ICD-10-CM

## 2017-11-10 DIAGNOSIS — F17210 Nicotine dependence, cigarettes, uncomplicated: Secondary | ICD-10-CM | POA: Diagnosis not present

## 2017-11-10 DIAGNOSIS — Z9851 Tubal ligation status: Secondary | ICD-10-CM | POA: Diagnosis not present

## 2017-11-10 DIAGNOSIS — K5909 Other constipation: Secondary | ICD-10-CM | POA: Diagnosis not present

## 2017-11-10 DIAGNOSIS — M549 Dorsalgia, unspecified: Secondary | ICD-10-CM | POA: Insufficient documentation

## 2017-11-10 DIAGNOSIS — M5441 Lumbago with sciatica, right side: Secondary | ICD-10-CM

## 2017-11-10 DIAGNOSIS — M79604 Pain in right leg: Secondary | ICD-10-CM

## 2017-11-10 DIAGNOSIS — M4802 Spinal stenosis, cervical region: Secondary | ICD-10-CM | POA: Diagnosis not present

## 2017-11-10 DIAGNOSIS — G894 Chronic pain syndrome: Secondary | ICD-10-CM | POA: Diagnosis not present

## 2017-11-10 MED ORDER — LINACLOTIDE 145 MCG PO CAPS: 145.0000 ug | ORAL_CAPSULE | Freq: Every day | ORAL | 0 refills | Status: DC

## 2017-11-10 MED ORDER — HYDROCODONE-ACETAMINOPHEN 5-325 MG PO TABS: 1.0000 | ORAL_TABLET | Freq: Two times a day (BID) | ORAL | 0 refills | Status: DC

## 2017-11-10 NOTE — Patient Instructions (Addendum)
____________________________________________________________________________________________  Medication Rules Patient given prescription for Hydrocodone-acetaminophen.  Applies to: All patients receiving prescriptions (written or electronic).  Pharmacy of record: Pharmacy where electronic prescriptions will be sent. If written prescriptions are taken to a different pharmacy, please inform the nursing staff. The pharmacy listed in the electronic medical record should be the one where you would like electronic prescriptions to be sent.  Prescription refills: Only during scheduled appointments. Applies to both, written and electronic prescriptions.  NOTE: The following applies primarily to controlled substances (Opioid* Pain Medications).   Patient's responsibilities: 1. Pain Pills: Bring all pain pills to every appointment (except for procedure appointments). 2. Pill Bottles: Bring pills in original pharmacy bottle. Always bring newest bottle. Bring bottle, even if empty. 3. Medication refills: You are responsible for knowing and keeping track of what medications you need refilled. The day before your appointment, write a list of all prescriptions that need to be refilled. Bring that list to your appointment and give it to the admitting nurse. Prescriptions will be written only during appointments. If you forget a medication, it will not be "Called in", "Faxed", or "electronically sent". You will need to get another appointment to get these prescribed. 4. Prescription Accuracy: You are responsible for carefully inspecting your prescriptions before leaving our office. Have the discharge nurse carefully go over each prescription with you, before taking them home. Make sure that your name is accurately spelled, that your address is correct. Check the name and dose of your medication to make sure it is accurate. Check the number of pills, and the written instructions to make sure they are clear and  accurate. Make sure that you are given enough medication to last until your next medication refill appointment. 5. Taking Medication: Take medication as prescribed. Never take more pills than instructed. Never take medication more frequently than prescribed. Taking less pills or less frequently is permitted and encouraged, when it comes to controlled substances (written prescriptions).  6. Inform other Doctors: Always inform, all of your healthcare providers, of all the medications you take. 7. Pain Medication from other Providers: You are not allowed to accept any additional pain medication from any other Doctor or Healthcare provider. There are two exceptions to this rule. (see below) In the event that you require additional pain medication, you are responsible for notifying us, as stated below. 8. Medication Agreement: You are responsible for carefully reading and following our Medication Agreement. This must be signed before receiving any prescriptions from our practice. Safely store a copy of your signed Agreement. Violations to the Agreement will result in no further prescriptions. (Additional copies of our Medication Agreement are available upon request.) 9. Laws, Rules, & Regulations: All patients are expected to follow all Federal and [employer], [employer], Rules, [employer]. Ignorance of the Laws does not constitute a valid excuse. The use of any illegal substances is prohibited. 10. Adopted CDC guidelines & recommendations: Target dosing levels will be at or below 60 MME/day. Use of benzodiazepines** is not recommended.  Exceptions: There are only two exceptions to the rule of not receiving pain medications from other Healthcare Providers. 1. Exception #1 (Emergencies): In the event of an emergency (i.e.: accident requiring emergency care), you are allowed to receive additional pain medication. However, you are responsible for: As soon as you are able, call our office [phone], at any  time of the day or night, and leave a message stating your name, the date and nature of the emergency, and the name  and dose of the medication prescribed. In the event that your call is answered by a member of our staff, make sure to document and save the date, time, and the name of the person that took your information.  2. Exception #2 (Planned Surgery): In the event that you are scheduled by another doctor or dentist to have any type of surgery or procedure, you are allowed (for a period no longer than 30 days), to receive additional pain medication, for the acute post-op pain. However, in this case, you are responsible for picking up a copy of our "Post-op Pain Management for Surgeons" handout, and giving it to your surgeon or dentist. This document is available at our office, and does not require an appointment to obtain it. Simply go to our office during business hours (Monday-Thursday from 8:00 AM to 4:00 PM) (Friday 8:00 AM to 12:00 Noon) or if you have a scheduled appointment with [place], prior to your surgery, and ask for it by name. In addition, you will need to provide [place] with your name, name of your surgeon, type of surgery, and date of procedure or surgery.  *Opioid medications include: morphine, codeine, oxycodone, oxymorphone, hydrocodone, hydromorphone, meperidine, tramadol, tapentadol, buprenorphine, fentanyl, methadone. **Benzodiazepine medications include: diazepam (Valium), alprazolam (Xanax), clonazepam (Klonopine), lorazepam (Ativan), clorazepate (Tranxene), chlordiazepoxide (Librium), estazolam (Prosom), oxazepam (Serax), temazepam (Restoril), triazolam (Halcion)  ____________________________________________________________________________________________

## 2017-11-10 NOTE — Progress Notes (Signed)
Safety precautions to be maintained throughout the outpatient stay will include: orient to surroundings, keep bed in low position, maintain call bell within reach at all times, provide assistance with transfer out of bed and ambulation.  

## 2017-11-10 NOTE — Progress Notes (Signed)
Nursing Pain Medication Assessment:  Safety precautions to be maintained throughout the outpatient stay will include: orient to surroundings, keep bed in low position, maintain call bell within reach at all times, provide assistance with transfer out of bed and ambulation.  Medication Inspection Compliance: Pill count conducted under aseptic conditions, in front of the patient. Neither the pills nor the bottle was removed from the patient's sight at any time. Once count was completed pills were immediately returned to the patient in their original bottle.  Medication: Hydrocodone/APAP Pill/Patch Count: 15 of 60 pills remain Pill/Patch Appearance: Markings consistent with prescribed medication Bottle Appearance: Standard pharmacy container. Clearly labeled. Filled Date: 43 / 21 / 2018 Last Medication intake:  Today

## 2017-11-10 NOTE — Progress Notes (Signed)
Patient's Name: Karen Edwards  MRN: [id number]  Referring Provider: Pleas Koch, NP  DOB: 1966/03/26  PCP: Karen Koch, NP  DOS: 11/10/2017  Note by: Karen Francois NP  Service setting: Ambulatory outpatient  Specialty: Interventional Pain Management  Location: [facility] (AMB) Pain Management Facility    Patient type: Established    Primary Reason(s) for Visit: Encounter for prescription drug management. (Level of risk: moderate)  CC: Back Pain (lower on right side)  HPI  [name] is a 52 y.o. year old, female patient, who comes today for a medication management evaluation. He has Chronic low back pain (Primary Area of Pain) (Bilateral) (R>L); Menorrhagia; Fatigue; DDD (degenerative disc disease), lumbar; DDD (degenerative disc disease), cervical; Chronic occipital neuralgia (Fifth Area of Pain) (Bilateral) (R>L); Lumbar facet syndrome (Bilateral) (L>R); DDD (degenerative disc disease), lumbosacral; Lumbar radicular pain (Right) (L4); Sacroiliac joint dysfunction (Bilateral); Depression; Rectal bleeding; Insomnia; Chronic constipation; Pharmacologic therapy; Hyperlipidemia, unspecified; Chronic neck pain (Fourth Area of Pain) (Bilateral) (R>L); GERD (gastroesophageal reflux disease); Numbness and tingling of upper extremity (Right); Chronic upper extremity pain (Bilateral) (R>L); Chronic sacroiliac joint pain (Bilateral) (L>R); Lumbar facet hypertrophy (Bilateral); L1-2 disc extrusion (Right); Cervical foraminal stenosis (C5-6) (Bilateral); Long term prescription opiate use; Opiate use; Disorder of skeletal system; Problems influencing health status; Chronic pain syndrome; Chronic cervical radicular pain (Right: C6/C7) (Left: C5/T1); Chronic lumbar radicular pain; Chronic hip pain (Tertiary Area of Pain) (Bilateral) (R>L); Chronic lower extremity pain (Secondary Area of Pain) (Bilateral) (R>L); Chronic shoulder pain (Bilateral) (L>R); and Chronic musculoskeletal pain on their problem list.  His primarily concern today is the Back Pain (lower on right side)  Pain Assessment: Location: Right, Lower Back Radiating: down side and back of right leg to little toe Onset: More than a month ago Duration: Chronic pain Quality: Constant, Stabbing, Numbness, Burning Severity: 3 /10 (self-reported pain score)  Note: Reported level is compatible with observation.                          Effect on ADL: ADLs which require pt to be on feet any length of time Timing: Constant Modifying factors: laying down, sitting for short periods, medications  [name] was last scheduled for an appointment on Visit date not found for medication management. During today's appointment we reviewed Mr. Beach's chronic pain status, as well as his outpatient medication regimen. She states that the right is greater than the left. She is ready to have her interventional procedure on Thursday. She admits that she is having constipation. She admits that she has used the Lizness in the past which was effective. She state it was too expensive.   The patient  reports that he does not use drugs. His body mass index is 28.15 kg/m.  Further details on both, my assessment(s), as well as the proposed treatment plan, please see below.  Controlled Substance Pharmacotherapy Assessment REMS (Risk Evaluation and Mitigation Strategy)  Analgesic: Hydrocodone/APAP 5/325 1 tablet p.o. twice daily MME/day:29m/day WRise Patience 11/10/2017  2:21 PM  Sign at close encounter Nursing Pain Medication Assessment:  Safety precautions to be maintained throughout the outpatient stay will include: orient to surroundings, keep bed in low position, maintain call bell within reach at all times, provide assistance with transfer out of bed and ambulation.  Medication Inspection Compliance: Pill count conducted under aseptic conditions, in front of the patient. Neither the pills nor the bottle was removed from the patient's sight at  any  time. Once count was completed pills were immediately returned to the patient in their original bottle.  Medication: Hydrocodone/APAP Pill/Patch Count: 15 of 60 pills remain Pill/Patch Appearance: Markings consistent with prescribed medication Bottle Appearance: Standard pharmacy container. Clearly labeled. Filled Date: 14 / 21 / 2018 Last Medication intake:  Today   Pharmacokinetics: Liberation and absorption (onset of action): WNL Distribution (time to peak effect): WNL Metabolism and excretion (duration of action): WNL         Pharmacodynamics: Desired effects: Analgesia: [name] reports >50% benefit. Functional ability: Patient reports that medication allows him to accomplish basic ADLs Clinically meaningful improvement in function (CMIF): Sustained CMIF goals met Perceived effectiveness: Described as relatively effective, allowing for increase in activities of daily living (ADL) Undesirable effects: Side-effects or Adverse reactions: None reported Monitoring: [place] PMP: Online review of the past 98-monthperiod conducted. Compliant with practice rules and regulations Last UDS on record: Summary  Date Value Ref Range Status  09/08/2017 FINAL  Final    Comment:    ==================================================================== TOXASSURE COMP DRUG ANALYSIS,UR ==================================================================== Test                             Result       Flag       Units Drug Present   Zolpidem Acid                  PRESENT    Zolpidem acid is an expected metabolite of zolpidem.   Fluoxetine                     PRESENT   Norfluoxetine                  PRESENT    Norfluoxetine is an expected metabolite of fluoxetine.   Trazodone                      PRESENT   1,3 chlorophenyl piperazine    PRESENT    1,3-chlorophenyl piperazine is an expected metabolite of    trazodone. ==================================================================== Test                       Result    Flag   Units      Ref Range   Creatinine              56               mg/dL      >=20 ==================================================================== Declared Medications:  Medication list was not provided. ==================================================================== For clinical consultation, please call [phone] ====================================================================    UDS interpretation: Compliant          Medication Assessment Form: Reviewed. Patient indicates being compliant with therapy Treatment compliance: Compliant Risk Assessment Profile: Aberrant behavior: See prior evaluations. None observed or detected today Comorbid factors increasing risk of overdose: See prior notes. No additional risks detected today Risk of substance use disorder (SUD): Low Opioid Risk Tool - 11/10/17 1413      Family History of Substance Abuse   Alcohol  Negative    Illegal Drugs  Negative    Rx Drugs  Negative      Personal History of Substance Abuse   Alcohol  Negative    Illegal Drugs  Negative    Rx Drugs  Negative      Age   Age between 124-45years  No      History of Preadolescent Sexual Abuse   History of Preadolescent Sexual Abuse  Negative or Female      Psychological Disease   Psychological Disease  Negative    Depression  Positive      Total Score   Opioid Risk Tool Scoring  1    Opioid Risk Interpretation  Low Risk      ORT Scoring interpretation table:  Score <3 = Low Risk for SUD  Score between 4-7 = Moderate Risk for SUD  Score >8 = High Risk for Opioid Abuse   Risk Mitigation Strategies:  Patient Counseling: Covered Patient-Prescriber Agreement (PPA): Present and active  Notification to other healthcare providers: Done  Pharmacologic Plan: No change in therapy, at this time.             Laboratory Chemistry  Inflammation Markers (CRP: Acute Phase) (ESR: Chronic Phase) Lab Results  Component Value  Date   CRP 1.4 09/08/2017   ESRSEDRATE 11 09/08/2017                 Rheumatology Markers No results found for: RF, ANA, Therisa Doy[place], [facility]              Renal Function Markers Lab Results  Component Value Date   BUN 7 09/08/2017   CREATININE 0.84 09/08/2017   GFRAA 93 09/08/2017   GFRNONAA 81 09/08/2017                 Hepatic Function Markers Lab Results  Component Value Date   AST 18 09/08/2017   ALT 12 (L) 01/30/2017   ALBUMIN 4.7 09/08/2017   ALKPHOS 73 09/08/2017                 Electrolytes Lab Results  Component Value Date   NA 139 09/08/2017   K 4.4 09/08/2017   CL 99 09/08/2017   CALCIUM 9.8 09/08/2017   MG 2.1 09/08/2017                 Neuropathy Markers Lab Results  Component Value Date   VITAMINB12 >2000 (H) 09/08/2017                 Bone Pathology Markers Lab Results  Component Value Date   VD25OH 43.74 01/10/2016   25OHVITD1 51 09/08/2017   25OHVITD2 <1.0 09/08/2017   25OHVITD3 51 09/08/2017                 Coagulation Parameters Lab Results  Component Value Date   PLT 202.0 05/20/2017                 Cardiovascular Markers Lab Results  Component Value Date   TROPONINI <0.03 01/30/2017   HGB 13.5 05/20/2017   HCT 40.5 05/20/2017                 CA Markers No results found for: CEA, CA125, LABCA2               Note: Lab results reviewed.  Recent Diagnostic Imaging Results  DG HIP UNILAT W OR W/O PELVIS 2-3 VIEWS RIGHT CLINICAL DATA:  Chronic pelvic and hip pain. No report of injury. Pain center referral.  EXAM: DG HIP (WITH OR WITHOUT PELVIS) 2-3V RIGHT; DG HIP (WITH OR WITHOUT PELVIS) 2-3V LEFT  COMPARISON:  None in PACs  FINDINGS: The bony pelvis is subjectively adequately mineralized. There is no lytic or blastic lesion. The sacrum and SI joints appear normal.  AP and lateral views of both hips reveal preservation of the joint spaces. The articular surfaces of the femoral heads and  acetabuli remains smoothly rounded. The femoral necks, intertrochanteric, and subtrochanteric regions are normal. The overlying soft tissues are normal.  IMPRESSION: There is no acute or significant chronic bony abnormality of the pelvis or hips.  Electronically Signed   By: David  Martinique M.D.   On: 09/08/2017 15:42 DG HIP UNILAT W OR W/O PELVIS 2-3 VIEWS LEFT CLINICAL DATA:  Chronic pelvic and hip pain. No report of injury. Pain center referral.  EXAM: DG HIP (WITH OR WITHOUT PELVIS) 2-3V RIGHT; DG HIP (WITH OR WITHOUT PELVIS) 2-3V LEFT  COMPARISON:  None in PACs  FINDINGS: The bony pelvis is subjectively adequately mineralized. There is no lytic or blastic lesion. The sacrum and SI joints appear normal.  AP and lateral views of both hips reveal preservation of the joint spaces. The articular surfaces of the femoral heads and acetabuli remains smoothly rounded. The femoral necks, intertrochanteric, and subtrochanteric regions are normal. The overlying soft tissues are normal.  IMPRESSION: There is no acute or significant chronic bony abnormality of the pelvis or hips.  Electronically Signed   By: David  Martinique M.D.   On: 09/08/2017 15:42 DG Si Joints CLINICAL DATA:  Chronic pain in multiple areas of the body. No injury.  EXAM: BILATERAL SACROILIAC JOINTS - 3+ VIEW  COMPARISON:  None.  FINDINGS: The sacroiliac joint spaces are maintained and there is no evidence of arthropathy. No other bone abnormalities are seen.  IMPRESSION: Negative.  Electronically Signed   By: Staci Righter M.D.   On: 09/08/2017 15:42 DG Lumbar Spine Complete W/Bend CLINICAL DATA:  Chronic pain, pain clinic referral.  EXAM: LUMBAR SPINE - COMPLETE WITH BENDING VIEWS  COMPARISON:  Lumbar spine MRI of February 20, 2016  FINDINGS: The lumbar vertebral bodies are preserved in height. The pedicles and transverse processes are intact. The disc space heights are well maintained. There  is no spondylolisthesis. There is no significant facet joint hypertrophy. There is no evidence of ligamentous instability as the patient moves from the neutral to the flexed and to the extended positions. The observed portions of the sacrum are normal.  IMPRESSION: There is no acute or significant chronic bony abnormality of the lumbar spine.  Electronically Signed   By: David  Martinique M.D.   On: 09/08/2017 15:40  Complexity Note: Imaging results reviewed. Results shared with Mr. Allington, using Layman's terms.                         Meds   Current Outpatient Medications:  .  Cyanocobalamin (VITAMIN B-12) 5000 MCG TBDP, Take 2,500 mcg by mouth daily. , Disp: , Rfl:  .  FLUoxetine (PROZAC) 20 MG capsule, Take 3 capsules (60 mg total) by mouth daily., Disp: 90 capsule, Rfl: 0 .  [START ON 11/12/2017] HYDROcodone-acetaminophen (NORCO/VICODIN) 5-325 MG tablet, Take 1 tablet by mouth 2 (two) times daily., Disp: 60 tablet, Rfl: 0 .  Potassium 99 MG TABS, Take 1 tablet by mouth daily., Disp: , Rfl:  .  tiZANidine (ZANAFLEX) 4 MG capsule, Take 1 capsule (4 mg total) by mouth 3 (three) times daily as needed for muscle spasms., Disp: 90 capsule, Rfl: 2 .  traZODone (DESYREL) 100 MG tablet, Take 3 tablets (300 mg total) by mouth at bedtime., Disp: 90 tablet, Rfl: 0 .  linaclotide (LINZESS) 145 MCG CAPS capsule, Take 1 capsule (145 mcg  total) by mouth daily before breakfast., Disp: 30 capsule, Rfl: 0  ROS  Constitutional: Denies any fever or chills Gastrointestinal: No reported hemesis, hematochezia, vomiting, or acute GI distress Musculoskeletal: Denies any acute onset joint swelling, redness, loss of ROM, or weakness Neurological: No reported episodes of acute onset apraxia, aphasia, dysarthria, agnosia, amnesia, paralysis, loss of coordination, or loss of consciousness  Allergies  Mr. Inscoe is allergic to no known allergies.  [place]  Drug: Mr. Cedar  reports that he does not use  drugs. Alcohol:  reports that he drinks about 0.6 oz of alcohol per week. Tobacco:  reports that he has been smoking cigarettes.  He has a 3.70 pack-year smoking history. he has never used smokeless tobacco. Medical:  has a past medical history of Anxiety, Chronic back pain, Degenerative joint disease, Depression, Hypotension, IBS (irritable bowel syndrome) (05/2015), and MVP (mitral valve prolapse). Surgical: Mr. Scoville  has a past surgical history that includes Cesarean section; Ankle surgery (Right, 03/02/2014); Hemorrhoid surgery (N/A, 06/16/2015); Cervical spine surgery (02/21/2016); Cesarean section with bilateral tubal ligation (1990); Colonoscopy (04/19/2015); and Hysteroscopy w/D&C (12/24/2013). Family: family history includes Arthritis in his mother; Congestive Heart Failure in his maternal grandfather; Depression in his mother; Diabetes in his maternal aunt; Diabetes Mellitus I in his maternal grandmother; Gout in his maternal aunt; Hearing loss in his father; Heart attack in his maternal grandfather; Hyperlipidemia in his father and mother; Hypertension in his father and mother; Hypothyroidism in his mother; Kidney disease in his mother; Prostate cancer in his paternal grandfather; Stroke in his maternal grandmother; Thyroid disease in his maternal aunt; Vision loss in his mother.  Constitutional Exam  General appearance: Well nourished, well developed, and well hydrated. In no apparent acute distress Vitals:   11/10/17 1403  BP: 116/68  Pulse: 75  Resp: 16  Temp: 98.5 F (36.9 C)  TempSrc: Oral  SpO2: 99%  Weight: 149 lb (67.6 kg)  Height: 5' 1"  (1.549 m)   BMI Assessment: Estimated body mass index is 28.15 kg/m as calculated from the following:   Height as of this encounter: 5' 1"  (1.549 m).   Weight as of this encounter: 149 lb (67.6 kg).  Psych/Mental status: Alert, oriented x 3 (person, place, & time)       Eyes: PERLA Respiratory: No evidence of acute respiratory  distress  Cervical Spine Area Exam  Skin & Axial Inspection: No masses, redness, edema, swelling, or associated skin lesions Alignment: Symmetrical Functional ROM: Unrestricted ROM      Stability: No instability detected Muscle Tone/Strength: Functionally intact. No obvious neuro-muscular anomalies detected. Sensory (Neurological): Unimpaired Palpation: No palpable anomalies              Upper Extremity (UE) Exam    Side: Right upper extremity  Side: Left upper extremity  Skin & Extremity Inspection: Skin color, temperature, and hair growth are WNL. No peripheral edema or cyanosis. No masses, redness, swelling, asymmetry, or associated skin lesions. No contractures.  Skin & Extremity Inspection: Skin color, temperature, and hair growth are WNL. No peripheral edema or cyanosis. No masses, redness, swelling, asymmetry, or associated skin lesions. No contractures.  Functional ROM: Unrestricted ROM          Functional ROM: Unrestricted ROM          Muscle Tone/Strength: Functionally intact. No obvious neuro-muscular anomalies detected.  Muscle Tone/Strength: Functionally intact. No obvious neuro-muscular anomalies detected.  Sensory (Neurological): Unimpaired          Sensory (Neurological): Unimpaired  Palpation: No palpable anomalies              Palpation: No palpable anomalies              Specialized Test(s): Deferred         Specialized Test(s): Deferred          Thoracic Spine Area Exam  Skin & Axial Inspection: No masses, redness, or swelling Alignment: Symmetrical Functional ROM: Unrestricted ROM Stability: No instability detected Muscle Tone/Strength: Functionally intact. No obvious neuro-muscular anomalies detected. Sensory (Neurological): Unimpaired Muscle strength & Tone: No palpable anomalies  Lumbar Spine Area Exam  Skin & Axial Inspection: No masses, redness, or swelling Alignment: Symmetrical Functional ROM: Unrestricted ROM      Stability: No instability  detected Muscle Tone/Strength: Functionally intact. No obvious neuro-muscular anomalies detected. Sensory (Neurological): Unimpaired Palpation: No palpable anomalies       Provocative Tests: Lumbar Hyperextension and rotation test: evaluation deferred today       Lumbar Lateral bending test: evaluation deferred today       Patrick's Maneuver: evaluation deferred today                    Gait & Posture Assessment  Ambulation: Unassisted Gait: Relatively normal for age and body habitus Posture: WNL   Lower Extremity Exam    Side: Right lower extremity  Side: Left lower extremity  Skin & Extremity Inspection: Skin color, temperature, and hair growth are WNL. No peripheral edema or cyanosis. No masses, redness, swelling, asymmetry, or associated skin lesions. No contractures.  Skin & Extremity Inspection: Skin color, temperature, and hair growth are WNL. No peripheral edema or cyanosis. No masses, redness, swelling, asymmetry, or associated skin lesions. No contractures.  Functional ROM: Unrestricted ROM          Functional ROM: Unrestricted ROM          Muscle Tone/Strength: Functionally intact. No obvious neuro-muscular anomalies detected.  Muscle Tone/Strength: Functionally intact. No obvious neuro-muscular anomalies detected.  Sensory (Neurological): Unimpaired  Sensory (Neurological): Unimpaired  Palpation: No palpable anomalies  Palpation: No palpable anomalies   Assessment  Primary Diagnosis & Pertinent Problem List: The primary encounter diagnosis was Chronic low back pain (Primary Area of Pain) (Bilateral) (R>L). Diagnoses of Chronic lower extremity pain (Secondary Area of Pain) (Bilateral) (R>L), Chronic hip pain (Tertiary Area of Pain) (Bilateral) (R>L), Chronic musculoskeletal pain, Chronic pain syndrome, and Chronic constipation were also pertinent to this visit.  Status Diagnosis  Controlled Controlled Controlled 1. Chronic low back pain (Primary Area of Pain) (Bilateral) (R>L)    2. Chronic lower extremity pain (Secondary Area of Pain) (Bilateral) (R>L)   3. Chronic hip pain (Tertiary Area of Pain) (Bilateral) (R>L)   4. Chronic musculoskeletal pain   5. Chronic pain syndrome   6. Chronic constipation     Problems updated and reviewed during this visit: No problems updated. Plan of Care  Pharmacotherapy (Medications Ordered): Meds ordered this encounter  Medications  . HYDROcodone-acetaminophen (NORCO/VICODIN) 5-325 MG tablet    Sig: Take 1 tablet by mouth 2 (two) times daily.    Dispense:  60 tablet    Refill:  0    Fill one day early if pharmacy is closed on scheduled refill date. Do not fill until: 11/12/2017 To last until: 12/12/2017    Order Specific Question:   Supervising Provider    Answer:   Milinda Pointer [phone]  . linaclotide (LINZESS) 145 MCG CAPS capsule    Sig: Take  1 capsule (145 mcg total) by mouth daily before breakfast.    Dispense:  30 capsule    Refill:  0    Order Specific Question:   Supervising Provider    Answer:   Milinda Pointer [phone]   New Prescriptions   LINACLOTIDE (LINZESS) 145 MCG CAPS CAPSULE    Take 1 capsule (145 mcg total) by mouth daily before breakfast.   Medications administered today: Syerra L. Falkner had no medications administered during this visit. Lab-work, procedure(s), and/or referral(s): No orders of the defined types were placed in this encounter.  Imaging and/or referral(s): None  Interventional management options: Planned, scheduled, and/or pending:    Diagnostic bilateral lumbar facet block + diagnostic bilateral sacroiliac joint block under fluoroscopic guidance and IV sedation    Considering:   Diagnostic bilateral lumbar facet block Possible bilateral lumbar facet RFA Diagnostic bilateral sacroiliac joint block Possible bilateral sacroiliac joint RFA Diagnostic bilateral intra-articular hip joint injection Diagnostic bilateral femoral nerve block + obturator nerve  block Possible bilateral femoral nerve + obturator nerve RFA Diagnostic right-sided L1-2lumbar epidural steroid injection Diagnostic right-sided L1-2transforaminal epidural steroid injection Diagnostic bilateralL4-5lumbar transforaminal epidural steroid injection Diagnostic bilateral cervical facet block Possible bilateral cervical facet RFA Diagnostic right-sided cervical epidural steroid injection Diagnostic bilateral greater occipital nerve block Possible bilateral occipital nerve RFA Diagnostic bilateral intra-articular shoulder joint injection Diagnostic bilateral suprascapular nerve block Possible bilateral suprascapular nerve RFA   PRN Procedures:   None at this time   Provider-requested follow-up: Return in about 4 weeks (around 12/08/2017) for MedMgmt with Me Dionisio David).  Future Appointments  Date Time Provider [place]  11/13/2017 12:45 PM Milinda Pointer, MD [facility] None  12/01/2017  1:45 PM Karen Francois, NP [facility] None  12/16/2017  3:45 PM Karen Koch, NP [facility]   Primary Care Physician: Karen Koch, NP Location: [facility] Note by: Karen Francois NP Date: 11/10/2017; Time: 3:54 PM  Pain Score Disclaimer: We use the NRS-11 scale. This is a self-reported, subjective measurement of pain severity with only modest accuracy. It is used primarily to identify changes within a particular patient. It must be understood that outpatient pain scales are significantly less accurate that those used for research, where they can be applied under ideal controlled circumstances with minimal exposure to variables. In reality, the score is likely to be a combination of pain intensity and pain affect, where pain affect describes the degree of emotional arousal or changes in action readiness caused by the sensory experience of pain. Factors such as social and work situation, setting, emotional state, anxiety  levels, expectation, and prior pain experience may influence pain perception and show large inter-individual differences that may also be affected by time variables.  Patient instructions provided during this appointment: Patient Instructions   ____________________________________________________________________________________________  Medication Rules Patient given prescription for Hydrocodone-acetaminophen.  Applies to: All patients receiving prescriptions (written or electronic).  Pharmacy of record: Pharmacy where electronic prescriptions will be sent. If written prescriptions are taken to a different pharmacy, please inform the nursing staff. The pharmacy listed in the electronic medical record should be the one where you would like electronic prescriptions to be sent.  Prescription refills: Only during scheduled appointments. Applies to both, written and electronic prescriptions.  NOTE: The following applies primarily to controlled substances (Opioid* Pain Medications).   Patient's responsibilities: 1. Pain Pills: Bring all pain pills to every appointment (except for procedure appointments). 2. Pill Bottles: Bring pills in original pharmacy bottle.  Always bring newest bottle. Bring bottle, even if empty. 3. Medication refills: You are responsible for knowing and keeping track of what medications you need refilled. The day before your appointment, write a list of all prescriptions that need to be refilled. Bring that list to your appointment and give it to the admitting nurse. Prescriptions will be written only during appointments. If you forget a medication, it will not be "Called in", "Faxed", or "electronically sent". You will need to get another appointment to get these prescribed. 4. Prescription Accuracy: You are responsible for carefully inspecting your prescriptions before leaving our office. Have the discharge nurse carefully go over each prescription with you, before taking them  home. Make sure that your name is accurately spelled, that your address is correct. Check the name and dose of your medication to make sure it is accurate. Check the number of pills, and the written instructions to make sure they are clear and accurate. Make sure that you are given enough medication to last until your next medication refill appointment. 5. Taking Medication: Take medication as prescribed. Never take more pills than instructed. Never take medication more frequently than prescribed. Taking less pills or less frequently is permitted and encouraged, when it comes to controlled substances (written prescriptions).  6. Inform other Doctors: Always inform, all of your healthcare providers, of all the medications you take. 7. Pain Medication from other Providers: You are not allowed to accept any additional pain medication from any other Doctor or Healthcare provider. There are two exceptions to this rule. (see below) In the event that you require additional pain medication, you are responsible for notifying us, as stated below. 8. Medication Agreement: You are responsible for carefully reading and following our Medication Agreement. This must be signed before receiving any prescriptions from our practice. Safely store a copy of your signed Agreement. Violations to the Agreement will result in no further prescriptions. (Additional copies of our Medication Agreement are available upon request.) 9. Laws, Rules, & Regulations: All patients are expected to follow all Federal and [employer], Rules, [employer]. Ignorance of the Laws does not constitute a valid excuse. The use of any illegal substances is prohibited. 10. Adopted CDC guidelines & recommendations: Target dosing levels will be at or below 60 MME/day. Use of benzodiazepines** is not recommended.  Exceptions: There are only two exceptions to the rule of not receiving pain medications from other Healthcare Providers. 1. Exception #1  (Emergencies): In the event of an emergency (i.e.: accident requiring emergency care), you are allowed to receive additional pain medication. However, you are responsible for: As soon as you are able, call our office [phone], at any time of the day or night, and leave a message stating your name, the date and nature of the emergency, and the name and dose of the medication prescribed. In the event that your call is answered by a member of our staff, make sure to document and save the date, time, and the name of the person that took your information.  2. Exception #2 (Planned Surgery): In the event that you are scheduled by another doctor or dentist to have any type of surgery or procedure, you are allowed (for a period no longer than 30 days), to receive additional pain medication, for the acute post-op pain. However, in this case, you are responsible for picking up a copy of our "Post-op Pain Management for Surgeons" handout, and giving it to your surgeon or dentist. This document is available  at our office, and does not require an appointment to obtain it. Simply go to our office during business hours (Monday-Thursday from 8:00 AM to 4:00 PM) (Friday 8:00 AM to 12:00 Noon) or if you have a scheduled appointment with [place], prior to your surgery, and ask for it by name. In addition, you will need to provide [place] with your name, name of your surgeon, type of surgery, and date of procedure or surgery.  *Opioid medications include: morphine, codeine, oxycodone, oxymorphone, hydrocodone, hydromorphone, meperidine, tramadol, tapentadol, buprenorphine, fentanyl, methadone. **Benzodiazepine medications include: diazepam (Valium), alprazolam (Xanax), clonazepam (Klonopine), lorazepam (Ativan), clorazepate (Tranxene), chlordiazepoxide (Librium), estazolam (Prosom), oxazepam (Serax), temazepam (Restoril), triazolam  (Halcion)  ____________________________________________________________________________________________

## 2017-11-10 NOTE — Telephone Encounter (Signed)
Tried to call patient and could not hear person on the other end of the line. Will try to call back later.

## 2017-11-11 ENCOUNTER — Encounter: Payer: PPO | Admitting: Nurse Practitioner

## 2017-11-11 NOTE — Telephone Encounter (Signed)
Looks like patient made a follow up appt on 12/16/2017 with Anda Kraft

## 2017-11-12 ENCOUNTER — Ambulatory Visit: Payer: PPO | Admitting: Primary Care

## 2017-11-13 ENCOUNTER — Encounter: Payer: Self-pay | Admitting: Pain Medicine

## 2017-11-13 ENCOUNTER — Ambulatory Visit
Admission: RE | Admit: 2017-11-13 | Discharge: 2017-11-13 | Disposition: A | Discharge status: Home / Self Care | Payer: PPO | Source: Ambulatory Visit | Attending: Pain Medicine | Admitting: Pain Medicine

## 2017-11-13 ENCOUNTER — Ambulatory Visit (HOSPITAL_BASED_OUTPATIENT_CLINIC_OR_DEPARTMENT_OTHER): Payer: PPO | Admitting: Pain Medicine

## 2017-11-13 VITALS — BP 126/69 | HR 64 | Temp 97.3°F | Resp 14 | Ht 61.0 in | Wt 140.0 lb

## 2017-11-13 DIAGNOSIS — M5442 Lumbago with sciatica, left side: Secondary | ICD-10-CM | POA: Diagnosis not present

## 2017-11-13 DIAGNOSIS — M47896 Other spondylosis, lumbar region: Secondary | ICD-10-CM | POA: Diagnosis not present

## 2017-11-13 DIAGNOSIS — M5441 Lumbago with sciatica, right side: Principal | ICD-10-CM

## 2017-11-13 DIAGNOSIS — G8929 Other chronic pain: Secondary | ICD-10-CM | POA: Diagnosis not present

## 2017-11-13 DIAGNOSIS — M47816 Spondylosis without myelopathy or radiculopathy, lumbar region: Secondary | ICD-10-CM

## 2017-11-13 DIAGNOSIS — M533 Sacrococcygeal disorders, not elsewhere classified: Secondary | ICD-10-CM

## 2017-11-13 MED ORDER — TRIAMCINOLONE ACETONIDE 40 MG/ML IJ SUSP
40.0000 mg | Freq: Once | INTRAMUSCULAR | Status: AC
  Administered 2017-11-13: 40 mg
  Filled 2017-11-13: qty 1

## 2017-11-13 MED ORDER — LACTATED RINGERS IV SOLN
1000.0000 mL | Freq: Once | INTRAVENOUS | Status: AC
  Administered 2017-11-13: 1000 mL via INTRAVENOUS

## 2017-11-13 MED ORDER — FENTANYL CITRATE (PF) 100 MCG/2ML IJ SOLN
25.0000 ug | INTRAMUSCULAR | Status: DC | PRN
  Administered 2017-11-13: 100 ug via INTRAVENOUS
  Filled 2017-11-13: qty 2

## 2017-11-13 MED ORDER — ROPIVACAINE HCL 2 MG/ML IJ SOLN
9.0000 mL | Freq: Once | INTRAMUSCULAR | Status: AC
  Administered 2017-11-13: 10 mL via PERINEURAL
  Filled 2017-11-13: qty 10

## 2017-11-13 MED ORDER — LIDOCAINE HCL 2 % IJ SOLN
10.0000 mL | Freq: Once | INTRAMUSCULAR | Status: AC
  Administered 2017-11-13: 400 mg
  Filled 2017-11-13: qty 20

## 2017-11-13 MED ORDER — ROPIVACAINE HCL 2 MG/ML IJ SOLN
9.0000 mL | Freq: Once | INTRAMUSCULAR | Status: AC
  Administered 2017-11-13: 10 mL via INTRA_ARTICULAR
  Filled 2017-11-13: qty 10

## 2017-11-13 MED ORDER — MIDAZOLAM HCL 5 MG/5ML IJ SOLN
1.0000 mg | INTRAMUSCULAR | Status: DC | PRN
  Administered 2017-11-13: 3 mg via INTRAVENOUS
  Filled 2017-11-13: qty 5

## 2017-11-13 MED ORDER — METHYLPREDNISOLONE ACETATE 80 MG/ML IJ SUSP
80.0000 mg | Freq: Once | INTRAMUSCULAR | Status: AC
  Administered 2017-11-13: 80 mg via INTRA_ARTICULAR
  Filled 2017-11-13: qty 1

## 2017-11-13 NOTE — Patient Instructions (Signed)

## 2017-11-13 NOTE — Progress Notes (Signed)
Patient's Name: Karen Edwards  MRN: [id number]  Referring Provider: Milinda Pointer, MD  DOB: 04/16/1966  PCP: Pleas Koch, NP  DOS: 11/13/2017  Note by: Gaspar Cola, MD  Service setting: Ambulatory outpatient  Specialty: Interventional Pain Management  Patient type: Established  Location: [facility] (AMB) Pain Management Facility  Visit type: Interventional Procedure   Primary Reason for Visit: Interventional Pain Management Treatment. CC: Back Pain (lower bilateral)  Procedure:  Anesthesia, Analgesia, Anxiolysis:  Procedure #1: Type: Diagnostic Medial Branch Facet Block #1  Region: Lumbar Level: L2, L3, L4, L5, & S1 Medial Branch Level(s) Laterality: Bilateral  Procedure #2: Type: Diagnostic Sacroiliac Joint Block #1  Region: Posterior Lumbosacral Level: PSIS (Posterior Superior Iliac Spine) Sacroiliac Joint Laterality: Bilateral  Type: Local Anesthesia with Moderate (Conscious) Sedation Local Anesthetic: Lidocaine 1% Route: Intravenous (IV) IV Access: Secured Sedation: Meaningful verbal contact was maintained at all times during the procedure  Indication(s): Analgesia and Anxiety   Indications: 1. Chronic low back pain (Primary Area of Pain) (Bilateral) (R>L)   2. Lumbar facet syndrome (Bilateral) (L>R)   3. Lumbar facet hypertrophy (Bilateral)   4. Chronic sacroiliac joint pain (Bilateral) (L>R)   5. Chronic bilateral low back pain with bilateral sciatica   6. Facet syndrome, lumbar   7. Facet hypertrophy of lumbar region   8. Chronic sacroiliac joint pain    Pain Score: Pre-procedure: 4 /10 Post-procedure: 2 /10  Pre-op Assessment:  Karen Edwards is a 52 y.o. (year old), female patient, seen today for interventional treatment. She  has a past surgical history that includes Cesarean section; Ankle surgery (Right, 03/02/2014); Hemorrhoid surgery (N/A, 06/16/2015); Cervical spine surgery (02/21/2016); Cesarean section with bilateral tubal ligation (1990);  Colonoscopy (04/19/2015); and Hysteroscopy w/D&C (12/24/2013). Karen Edwards has a current medication list which includes the following prescription(s): vitamin b-12, fluoxetine, hydrocodone-acetaminophen, linaclotide, potassium, tizanidine, and trazodone, and the following Facility-Administered Medications: fentanyl and midazolam. Her primarily concern today is the Back Pain (lower bilateral)  Initial Vital Signs: Last menstrual period 05/05/2017. BMI: Estimated body mass index is 26.45 kg/m as calculated from the following:   Height as of this encounter: 5\' 1"  (1.549 m).   Weight as of this encounter: 140 lb (63.5 kg).  Risk Assessment: Allergies: Reviewed. She is allergic to no known allergies.  Allergy Precautions: None required Coagulopathies: Reviewed. None identified.  Blood-thinner therapy: None at this time Active Infection(s): Reviewed. None identified. Karen Edwards is afebrile  Site Confirmation: Karen Edwards was asked to confirm the procedure and laterality before marking the site Procedure checklist: Completed Consent: Before the procedure and under the influence of no sedative(s), amnesic(s), or anxiolytics, the patient was informed of the treatment options, risks and possible complications. To fulfill our ethical and legal obligations, as recommended by the American Medical Association's Code of Ethics, I have informed the patient of my clinical impression; the nature and purpose of the treatment or procedure; the risks, benefits, and possible complications of the intervention; the alternatives, including doing nothing; the risk(s) and benefit(s) of the alternative treatment(s) or procedure(s); and the risk(s) and benefit(s) of doing nothing. The patient was provided information about the general risks and possible complications associated with the procedure. These may include, but are not limited to: failure to achieve desired goals, infection, bleeding, organ or nerve damage,  allergic reactions, paralysis, and death. In addition, the patient was informed of those risks and complications associated to Spine-related procedures, such as failure to decrease pain; infection (i.e.: Meningitis, epidural or intraspinal  abscess); bleeding (i.e.: epidural hematoma, subarachnoid hemorrhage, or any other type of intraspinal or peri-dural bleeding); organ or nerve damage (i.e.: Any type of peripheral nerve, nerve root, or spinal cord injury) with subsequent damage to sensory, motor, and/or autonomic systems, resulting in permanent pain, numbness, and/or weakness of one or several areas of the body; allergic reactions; (i.e.: anaphylactic reaction); and/or death. Furthermore, the patient was informed of those risks and complications associated with the medications. These include, but are not limited to: allergic reactions (i.e.: anaphylactic or anaphylactoid reaction(s)); adrenal axis suppression; blood sugar elevation that in diabetics may result in ketoacidosis or comma; water retention that in patients with history of congestive heart failure may result in shortness of breath, pulmonary edema, and decompensation with resultant heart failure; weight gain; swelling or edema; medication-induced neural toxicity; particulate matter embolism and blood vessel occlusion with resultant organ, and/or nervous system infarction; and/or aseptic necrosis of one or more joints. Finally, the patient was informed that Medicine is not an exact science; therefore, there is also the possibility of unforeseen or unpredictable risks and/or possible complications that may result in a catastrophic outcome. The patient indicated having understood very clearly. We have given the patient no guarantees and we have made no promises. Enough time was given to the patient to ask questions, all of which were answered to the patient's satisfaction. Karen Edwards has indicated that she wanted to continue with the  procedure. Attestation: I, the ordering provider, attest that I have discussed with the patient the benefits, risks, side-effects, alternatives, likelihood of achieving goals, and potential problems during recovery for the procedure that I have provided informed consent. Date: 11/13/2017; Time: 1:37 PM  Pre-Procedure Preparation:  Monitoring: As per clinic protocol. Respiration, ETCO2, SpO2, BP, heart rate and rhythm monitor placed and checked for adequate function Safety Precautions: Patient was assessed for positional comfort and pressure points before starting the procedure. Time-out: I initiated and conducted the "Time-out" before starting the procedure, as per protocol. The patient was asked to participate by confirming the accuracy of the "Time Out" information. Verification of the correct person, site, and procedure were performed and confirmed by me, the nursing staff, and the patient. "Time-out" conducted as per Joint Commission's Universal Protocol (UP.01.01.01). "Time-out" Date & Time: 11/13/2017; 1433 hrs.  Description of Procedure #1 Process:   Time-out: "Time-out" completed before starting procedure, as per protocol. Position: Prone Target Area: For Lumbar Facet blocks, the target is the groove formed by the junction of the transverse process and superior articular process. For the L5 dorsal ramus, the target is the notch between superior articular process and sacral ala. For the S1 dorsal ramus, the target is the superior and lateral edge of the posterior S1 Sacral foramen. Approach: Paramedial approach. Area Prepped: Entire Posterior Lumbosacral Region Prepping solution: ChloraPrep (2% chlorhexidine gluconate and 70% isopropyl alcohol) Safety Precautions: Aspiration looking for blood return was conducted prior to all injections. At no point did we inject any substances, as a needle was being advanced. No attempts were made at seeking any paresthesias. Safe injection practices and  needle disposal techniques used. Medications properly checked for expiration dates. SDV (single dose vial) medications used.  Description of the Procedure: Protocol guidelines were followed. The patient was placed in position over the fluoroscopy table. The target area was identified and the area prepped in the usual manner. Skin desensitized using vapocoolant spray. Skin & deeper tissues infiltrated with local anesthetic. Appropriate amount of time allowed to pass for local anesthetics to  take effect. The procedure needle was introduced through the skin, ipsilateral to the reported pain, and advanced to the target area. Employing the "Medial Branch Technique", the needles were advanced to the angle made by the superior and medial portion of the transverse process, and the lateral and inferior portion of the superior articulating process of the targeted vertebral bodies. This area is known as "Burton's Eye" or the "Eye of the [place] Dog". A procedure needle was introduced through the skin, and this time advanced to the angle made by the superior and medial border of the sacral ala, and the lateral border of the S1 vertebral body. This last needle was later repositioned at the superior and lateral border of the posterior S1 foramen. Negative aspiration confirmed. Solution injected in intermittent fashion, asking for systemic symptoms every 0.5cc of injectate. The needles were then removed and the area cleansed, making sure to leave some of the prepping solution back to take advantage of its long term bactericidal properties. Start Time: 1433 hrs. Materials:  Needle(s) Type: Regular needle Gauge: 22G Length: 3.5-in Medication(s): We administered midazolam, fentaNYL, lactated ringers, lidocaine, ropivacaine (PF) 2 mg/mL (0.2%), triamcinolone acetonide, ropivacaine (PF) 2 mg/mL (0.2%), triamcinolone acetonide, ropivacaine (PF) 2 mg/mL (0.2%), and methylPREDNISolone acetate. Please see chart orders for dosing  details.  Description of Procedure # 2 Process:   Position: Prone Target Area: For upper sacroiliac joint block(s), the target is the superior and posterior margin of the sacroiliac joint. Approach: Ipsilateral approach. Area Prepped: Entire Posterior Lumbosacral Region Prepping solution: ChloraPrep (2% chlorhexidine gluconate and 70% isopropyl alcohol) Safety Precautions: Aspiration looking for blood return was conducted prior to all injections. At no point did we inject any substances, as a needle was being advanced. No attempts were made at seeking any paresthesias. Safe injection practices and needle disposal techniques used. Medications properly checked for expiration dates. SDV (single dose vial) medications used. Description of the Procedure: Protocol guidelines were followed. The patient was placed in position over the fluoroscopy table. The target area was identified and the area prepped in the usual manner. Skin desensitized using vapocoolant spray. Skin & deeper tissues infiltrated with local anesthetic. Appropriate amount of time allowed to pass for local anesthetics to take effect. The procedure needle was advanced under fluoroscopic guidance into the sacroiliac joint until a firm endpoint was obtained. Proper needle placement secured. Negative aspiration confirmed. Solution injected in intermittent fashion, asking for systemic symptoms every 0.5cc of injectate. The needles were then removed and the area cleansed, making sure to leave some of the prepping solution back to take advantage of its long term bactericidal properties. Vitals:   11/13/17 1446 11/13/17 1456 11/13/17 1506 11/13/17 1516  BP: 114/71 (!) 116/53 124/65 126/69  Pulse:      Resp: 13 18 14 14   Temp:  (!) 97.5 F (36.4 C)  (!) 97.3 F (36.3 C)  TempSrc:      SpO2: 93% 93% 99% 100%  Weight:      Height:        End Time: 1445 hrs. Materials:  Needle(s) Type: Regular needle Gauge: 22G Length:  3.5-in Medication(s): We administered midazolam, fentaNYL, lactated ringers, lidocaine, ropivacaine (PF) 2 mg/mL (0.2%), triamcinolone acetonide, ropivacaine (PF) 2 mg/mL (0.2%), triamcinolone acetonide, ropivacaine (PF) 2 mg/mL (0.2%), and methylPREDNISolone acetate. Please see chart orders for dosing details.  Imaging Guidance (Spinal):  Type of Imaging Technique: Fluoroscopy Guidance (Spinal) Indication(s): Assistance in needle guidance and placement for procedures requiring needle placement in or near specific  anatomical locations not easily accessible without such assistance. Exposure Time: Please see nurses notes. Contrast: None used. Fluoroscopic Guidance: I was personally present during the use of fluoroscopy. "Tunnel Vision Technique" used to obtain the best possible view of the target area. Parallax error corrected before commencing the procedure. "Direction-depth-direction" technique used to introduce the needle under continuous pulsed fluoroscopy. Once target was reached, antero-posterior, oblique, and lateral fluoroscopic projection used confirm needle placement in all planes. Images permanently stored in EMR. Interpretation: No contrast injected. I personally interpreted the imaging intraoperatively. Adequate needle placement confirmed in multiple planes. Permanent images saved into the patient's record.  Antibiotic Prophylaxis:  Indication(s): None identified Antibiotic given: None  Post-operative Assessment:  EBL: None Complications: No immediate post-treatment complications observed by team, or reported by patient. Note: The patient tolerated the entire procedure well. A repeat set of vitals were taken after the procedure and the patient was kept under observation following institutional policy, for this type of procedure. Post-procedural neurological assessment was performed, showing return to baseline, prior to discharge. The patient was provided with post-procedure discharge  instructions, including a section on how to identify potential problems. Should any problems arise concerning this procedure, the patient was given instructions to immediately contact us, at any time, without hesitation. In any case, we plan to contact the patient by telephone for a follow-up status report regarding this interventional procedure. Comments:  No additional relevant information.  Plan of Care    Imaging Orders     DG C-Arm 1-60 Min-No Report  Procedure Orders     SACROILIAC JOINT INJECTION  Medications ordered for procedure: Meds ordered this encounter  Medications  . midazolam (VERSED) 5 MG/5ML injection 1-2 mg    Make sure Flumazenil is available in the pyxis when using this medication. If oversedation occurs, administer 0.2 mg IV over 15 sec. If after 45 sec no response, administer 0.2 mg again over 1 min; may repeat at 1 min intervals; not to exceed 4 doses (1 mg)  . fentaNYL (SUBLIMAZE) injection 25-50 mcg    Make sure Narcan is available in the pyxis when using this medication. In the event of respiratory depression (RR< 8/min): Titrate NARCAN (naloxone) in increments of 0.1 to 0.2 mg IV at 2-3 minute intervals, until desired degree of reversal.  . lactated ringers infusion 1,000 mL  . lidocaine (XYLOCAINE) 2 % (with pres) injection 200 mg  . ropivacaine (PF) 2 mg/mL (0.2%) (NAROPIN) injection 9 mL  . triamcinolone acetonide (KENALOG-40) injection 40 mg  . ropivacaine (PF) 2 mg/mL (0.2%) (NAROPIN) injection 9 mL  . triamcinolone acetonide (KENALOG-40) injection 40 mg  . ropivacaine (PF) 2 mg/mL (0.2%) (NAROPIN) injection 9 mL  . methylPREDNISolone acetate (DEPO-MEDROL) injection 80 mg   Medications administered: We administered midazolam, fentaNYL, lactated ringers, lidocaine, ropivacaine (PF) 2 mg/mL (0.2%), triamcinolone acetonide, ropivacaine (PF) 2 mg/mL (0.2%), triamcinolone acetonide, ropivacaine (PF) 2 mg/mL (0.2%), and methylPREDNISolone acetate.  See the  medical record for exact dosing, route, and time of administration.  New Prescriptions   No medications on file   Disposition: Discharge home  Discharge Date & Time: 11/13/2017; 1525 hrs.   Physician-requested Follow-up: Return for post-procedure eval (2 wks), w/ Dr. Dossie Arbour.  Future Appointments  Date Time Provider [place]  12/01/2017  1:45 PM Vevelyn Francois, NP [facility] None  12/10/2017  8:15 AM Milinda Pointer, MD [facility] None  12/16/2017  3:45 PM Pleas Koch, NP [facility]   Primary Care Physician: Pleas Koch, NP  Location: [facility] Note by: Gaspar Cola, MD Date: 11/13/2017; Time: 3:32 PM  Disclaimer:  Medicine is not an Chief Strategy Officer. The only guarantee in medicine is that nothing is guaranteed. It is important to note that the decision to proceed with this intervention was based on the information collected from the patient. The Data and conclusions were drawn from the patient's questionnaire, the interview, and the physical examination. Because the information was provided in large part by the patient, it cannot be guaranteed that it has not been purposely or unconsciously manipulated. Every effort has been made to obtain as much relevant data as possible for this evaluation. It is important to note that the conclusions that lead to this procedure are derived in large part from the available data. Always take into account that the treatment will also be dependent on availability of resources and existing treatment guidelines, considered by other Pain Management Practitioners as being common knowledge and practice, at the time of the intervention. For Medico-Legal purposes, it is also important to point out that variation in procedural techniques and pharmacological choices are the acceptable norm. The indications, contraindications, technique, and results of the above procedure should only be interpreted and judged by a  Board-Certified Interventional Pain Specialist with extensive familiarity and expertise in the same exact procedure and technique.

## 2017-11-14 ENCOUNTER — Telehealth: Payer: Self-pay | Admitting: *Deleted

## 2017-11-14 NOTE — Telephone Encounter (Signed)
Voicemail left for patient to call our office if there are questions or concerns re; procedure on yesterday.  

## 2017-12-01 ENCOUNTER — Ambulatory Visit: Payer: PPO | Admitting: Nurse Practitioner

## 2017-12-03 ENCOUNTER — Ambulatory Visit: Payer: PPO | Admitting: Pain Medicine

## 2017-12-08 ENCOUNTER — Other Ambulatory Visit: Payer: Self-pay

## 2017-12-08 ENCOUNTER — Ambulatory Visit: Payer: PPO | Attending: Nurse Practitioner | Admitting: Nurse Practitioner

## 2017-12-08 ENCOUNTER — Encounter: Payer: Self-pay | Admitting: Nurse Practitioner

## 2017-12-08 VITALS — BP 121/76 | HR 85 | Temp 98.6°F | Resp 16 | Ht 60.0 in | Wt 140.0 lb

## 2017-12-08 DIAGNOSIS — M47816 Spondylosis without myelopathy or radiculopathy, lumbar region: Secondary | ICD-10-CM | POA: Insufficient documentation

## 2017-12-08 DIAGNOSIS — E785 Hyperlipidemia, unspecified: Secondary | ICD-10-CM | POA: Diagnosis not present

## 2017-12-08 DIAGNOSIS — K625 Hemorrhage of anus and rectum: Secondary | ICD-10-CM | POA: Insufficient documentation

## 2017-12-08 DIAGNOSIS — M4802 Spinal stenosis, cervical region: Secondary | ICD-10-CM | POA: Diagnosis not present

## 2017-12-08 DIAGNOSIS — G894 Chronic pain syndrome: Secondary | ICD-10-CM | POA: Diagnosis not present

## 2017-12-08 DIAGNOSIS — M5481 Occipital neuralgia: Secondary | ICD-10-CM | POA: Diagnosis not present

## 2017-12-08 DIAGNOSIS — M79604 Pain in right leg: Secondary | ICD-10-CM | POA: Insufficient documentation

## 2017-12-08 DIAGNOSIS — G4700 Insomnia, unspecified: Secondary | ICD-10-CM | POA: Insufficient documentation

## 2017-12-08 DIAGNOSIS — M47817 Spondylosis without myelopathy or radiculopathy, lumbosacral region: Secondary | ICD-10-CM

## 2017-12-08 DIAGNOSIS — F17210 Nicotine dependence, cigarettes, uncomplicated: Secondary | ICD-10-CM | POA: Insufficient documentation

## 2017-12-08 DIAGNOSIS — M5116 Intervertebral disc disorders with radiculopathy, lumbar region: Secondary | ICD-10-CM | POA: Insufficient documentation

## 2017-12-08 DIAGNOSIS — M5442 Lumbago with sciatica, left side: Secondary | ICD-10-CM

## 2017-12-08 DIAGNOSIS — M79601 Pain in right arm: Secondary | ICD-10-CM | POA: Diagnosis not present

## 2017-12-08 DIAGNOSIS — M4726 Other spondylosis with radiculopathy, lumbar region: Secondary | ICD-10-CM | POA: Diagnosis not present

## 2017-12-08 DIAGNOSIS — M5441 Lumbago with sciatica, right side: Secondary | ICD-10-CM

## 2017-12-08 DIAGNOSIS — M7918 Myalgia, other site: Secondary | ICD-10-CM | POA: Diagnosis not present

## 2017-12-08 DIAGNOSIS — F329 Major depressive disorder, single episode, unspecified: Secondary | ICD-10-CM | POA: Insufficient documentation

## 2017-12-08 DIAGNOSIS — M5412 Radiculopathy, cervical region: Secondary | ICD-10-CM | POA: Insufficient documentation

## 2017-12-08 DIAGNOSIS — K219 Gastro-esophageal reflux disease without esophagitis: Secondary | ICD-10-CM | POA: Insufficient documentation

## 2017-12-08 DIAGNOSIS — Z79899 Other long term (current) drug therapy: Secondary | ICD-10-CM | POA: Insufficient documentation

## 2017-12-08 DIAGNOSIS — M25512 Pain in left shoulder: Secondary | ICD-10-CM | POA: Insufficient documentation

## 2017-12-08 DIAGNOSIS — M79602 Pain in left arm: Secondary | ICD-10-CM | POA: Diagnosis not present

## 2017-12-08 DIAGNOSIS — N920 Excessive and frequent menstruation with regular cycle: Secondary | ICD-10-CM | POA: Insufficient documentation

## 2017-12-08 DIAGNOSIS — M545 Low back pain: Secondary | ICD-10-CM | POA: Diagnosis not present

## 2017-12-08 DIAGNOSIS — K589 Irritable bowel syndrome without diarrhea: Secondary | ICD-10-CM | POA: Insufficient documentation

## 2017-12-08 DIAGNOSIS — M533 Sacrococcygeal disorders, not elsewhere classified: Secondary | ICD-10-CM

## 2017-12-08 DIAGNOSIS — M5137 Other intervertebral disc degeneration, lumbosacral region: Secondary | ICD-10-CM | POA: Insufficient documentation

## 2017-12-08 DIAGNOSIS — G8929 Other chronic pain: Secondary | ICD-10-CM | POA: Diagnosis not present

## 2017-12-08 DIAGNOSIS — M25511 Pain in right shoulder: Secondary | ICD-10-CM | POA: Diagnosis not present

## 2017-12-08 DIAGNOSIS — F419 Anxiety disorder, unspecified: Secondary | ICD-10-CM | POA: Insufficient documentation

## 2017-12-08 DIAGNOSIS — M79605 Pain in left leg: Secondary | ICD-10-CM | POA: Insufficient documentation

## 2017-12-08 DIAGNOSIS — I959 Hypotension, unspecified: Secondary | ICD-10-CM | POA: Insufficient documentation

## 2017-12-08 DIAGNOSIS — I341 Nonrheumatic mitral (valve) prolapse: Secondary | ICD-10-CM | POA: Insufficient documentation

## 2017-12-08 DIAGNOSIS — K5909 Other constipation: Secondary | ICD-10-CM

## 2017-12-08 MED ORDER — LINACLOTIDE 145 MCG PO CAPS: 145.0000 ug | ORAL_CAPSULE | Freq: Every day | ORAL | 0 refills | Status: DC

## 2017-12-08 MED ORDER — HYDROCODONE-ACETAMINOPHEN 5-325 MG PO TABS: 1.0000 | ORAL_TABLET | Freq: Two times a day (BID) | ORAL | 0 refills | Status: DC

## 2017-12-08 MED ORDER — METHOCARBAMOL 500 MG PO TABS: 500.0000 mg | ORAL_TABLET | Freq: Three times a day (TID) | ORAL | 0 refills | Status: DC | PRN

## 2017-12-08 NOTE — Progress Notes (Signed)
Nursing Pain Medication Assessment:  Safety precautions to be maintained throughout the outpatient stay will include: orient to surroundings, keep bed in low position, maintain call bell within reach at all times, provide assistance with transfer out of bed and ambulation.  Medication Inspection Compliance: Karen Edwards did not comply with our request to bring her pills to be counted. She was reminded that bringing the medication bottles, even when empty, is a requirement.  Medication: None brought in. Pill/Patch Count: None available to be counted. Bottle Appearance: No container available. Did not bring bottle(s) to appointment. Filled Date: N/A Last Medication intake:  "sometime in January"   Pt states pharmacy lost her Rx and that she has been without narcotic meds since mid January.

## 2017-12-08 NOTE — Progress Notes (Signed)
Patient's Name: Karen Edwards  MRN: [id number]  Referring Provider: Pleas Koch, NP  DOB: 14-Apr-1966  PCP: Pleas Koch, NP  DOS: 12/08/2017  Note by: Vevelyn Francois NP  Service setting: Ambulatory outpatient  Specialty: Interventional Pain Management  Location: [facility] (AMB) Pain Management Facility    Patient type: Established    Primary Reason(s) for Visit: Encounter for prescription drug management & post-procedure evaluation of chronic illness with mild to moderate exacerbation(Level of risk: moderate) CC: Back Pain (lower)  HPI  Karen Edwards is a 52 y.o. year old, female patient, who comes today for a post-procedure evaluation and medication management. She has Chronic low back pain (Primary Area of Pain) (Bilateral) (R>L); Menorrhagia; Fatigue; DDD (degenerative disc disease), lumbar; DDD (degenerative disc disease), cervical; Chronic occipital neuralgia (Fifth Area of Pain) (Bilateral) (R>L); Lumbar facet syndrome (Bilateral) (L>R); DDD (degenerative disc disease), lumbosacral; Lumbar radicular pain (Right) (L4); Sacroiliac joint dysfunction (Bilateral); Depression; Rectal bleeding; Insomnia; Chronic constipation; Pharmacologic therapy; Hyperlipidemia, unspecified; Chronic neck pain (Fourth Area of Pain) (Bilateral) (R>L); GERD (gastroesophageal reflux disease); Numbness and tingling of upper extremity (Right); Chronic upper extremity pain (Bilateral) (R>L); Chronic sacroiliac joint pain (Bilateral) (L>R); Lumbar facet hypertrophy (Bilateral); L1-2 disc extrusion (Right); Cervical foraminal stenosis (C5-6) (Bilateral); Long term prescription opiate use; Opiate use; Disorder of skeletal system; Problems influencing health status; Chronic pain syndrome; Chronic cervical radicular pain (Right: C6/C7) (Left: C5/T1); Chronic lumbar radicular pain; Chronic hip pain (Tertiary Area of Pain) (Bilateral) (R>L); Chronic lower extremity pain (Secondary Area of Pain) (Bilateral) (R>L); Chronic  shoulder pain (Bilateral) (L>R); Chronic musculoskeletal pain; and Lumbar spondylosis on their problem list. Her primarily concern today is the Back Pain (lower)  Pain Assessment: Location: Lower Back Radiating: sometimes pain moves to left buttock: left side worse than right side Onset: More than a month ago Quality: Constant, Sharp(a "catching" pain) Severity: 3 /10 (self-reported pain score)  Note: Reported level is compatible with observation.                          Timing: Constant Modifying factors: medications  Karen Edwards was last seen on 1/152/2019 for a procedure. During today's appointment we reviewed Karen Edwards's post-procedure results, as well as her outpatient medication regimen. She is SP bilateral Lumbar facet and SI joint injections. She states that she got no benefit. She denies even having numbness. She admits that she has suffered 2 falls secondary to accident not weakness.  She admits that the back pain is worse than the leg pain. She admit sthat bthe sides are feectie.  She has been out of her medication secondary to prescription loss.   Further details on both, my assessment(s), as well as the proposed treatment plan, please see below.  Controlled Substance Pharmacotherapy Assessment REMS (Risk Evaluation and Mitigation Strategy)  Analgesic:Hydrocodone/APAP 5/325 1 tablet p.o. twice daily MME/day:48m/day    WRise Patience 12/08/2017 12:10 PM  Sign at close encounter Nursing Pain Medication Assessment:  Safety precautions to be maintained throughout the outpatient stay will include: orient to surroundings, keep bed in low position, maintain call bell within reach at all times, provide assistance with transfer out of bed and ambulation.  Medication Inspection Compliance: [name] not comply with our request to bring her pills to be counted. She was reminded that bringing the medication bottles, even when empty, is a requirement.  Medication: None  brought in. Pill/Patch Count: None available to be counted. Bottle Appearance:  No container available. Did not bring bottle(s) to appointment. Filled Date: N/A Last Medication intake:  "sometime in January"   Pt states pharmacy lost her Rx and that she has been without narcotic meds since mid January.   Pharmacokinetics: Liberation and absorption (onset of action): WNL Distribution (time to peak effect): WNL Metabolism and excretion (duration of action): WNL         Pharmacodynamics: Desired effects: Analgesia: Karen Edwards reports >50% benefit. Functional ability: Patient reports that medication allows her to accomplish basic ADLs Clinically meaningful improvement in function (CMIF): Sustained CMIF goals met Perceived effectiveness: Described as relatively effective, allowing for increase in activities of daily living (ADL) Undesirable effects: Side-effects or Adverse reactions: None reported Monitoring: [place] PMP: Online review of the past 73-monthperiod conducted. Compliant with practice rules and regulations Last UDS on record: Summary  Date Value Ref Range Status  09/08/2017 FINAL  Final    Comment:    ==================================================================== TOXAS[facility]RE COMP DRUG ANALYSIS,UR ==================================================================== Test                             Result       Flag       Units Drug Present   Zolpidem Acid                  PRESENT    Zolpidem acid is an expected metabolite of zolpidem.   Fluoxetine                     PRESENT   Norfluoxetine                  PRESENT    Norfluoxetine is an expected metabolite of fluoxetine.   Trazodone                      PRESENT   1,3 chlorophenyl piperazine    PRESENT    1,3-chlorophenyl piperazine is an expected metabolite of    trazodone. ==================================================================== Test                      Result    Flag   Units      Ref Range    Creatinine              56               mg/dL      >=20 ==================================================================== Declared Medications:  Medication list was not provided. ==================================================================== For clinical consultation, please call ([phone] ====================================================================    UDS interpretation: Compliant          Medication Assessment Form: Reviewed. Patient indicates being compliant with therapy Treatment compliance: Compliant Risk Assessment Profile: Aberrant behavior: See prior evaluations. None observed or detected today Comorbid factors increasing risk of overdose: See prior notes. No additional risks detected today Risk of substance use disorder (SUD): Low Opioid Risk Tool - 12/08/17 1205      Family History of Substance Abuse   Alcohol  Negative    Illegal Drugs  Negative    Rx Drugs  Negative      Personal History of Substance Abuse   Alcohol  Negative    Illegal Drugs  Negative    Rx Drugs  Negative      Age   Age between 160-45years   No      History of Preadolescent Sexual Abuse   History  of Preadolescent Sexual Abuse  Negative or Female      Psychological Disease   Psychological Disease  Negative    Depression  Positive      Total Score   Opioid Risk Tool Scoring  1    Opioid Risk Interpretation  Low Risk      ORT Scoring interpretation table:  Score <3 = Low Risk for SUD  Score between 4-7 = Moderate Risk for SUD  Score >8 = High Risk for Opioid Abuse   Risk Mitigation Strategies:  Patient Counseling: Covered Patient-Prescriber Agreement (PPA): Present and active  Notification to other healthcare providers: Done  Pharmacologic Plan: No change in therapy, at this time.             Post-Procedure Assessment  1/147/2019 Procedure: Bilateral lumbar facet bilateral sacroiliac joint injection Pre-procedure pain score:  4/10 Post-procedure pain score:  2/10         Influential Factors: BMI: 27.34 kg/m Intra-procedural challenges: None observed.         Assessment challenges: None detected.              Reported side-effects: None.        Post-procedural adverse reactions or complications: None reported         Sedation: Please see nurses note. When no sedatives are used, the analgesic levels obtained are directly associated to the effectiveness of the local anesthetics. However, when sedation is provided, the level of analgesia obtained during the initial 1 hour following the intervention, is believed to be the result of a combination of factors. These factors may include, but are not limited to: 1. The effectiveness of the local anesthetics used. 2. The effects of the analgesic(s) and/or anxiolytic(s) used. 3. The degree of discomfort experienced by the patient at the time of the procedure. 4. The patients ability and reliability in recalling and recording the events. 5. The presence and influence of possible secondary gains and/or psychosocial factors. Reported result: Relief experienced during the 1st hour after the procedure: 10 % (Ultra-Short Term Relief)            Interpretative annotation: Clinically appropriate result. Analgesia during this period is likely to be Local Anesthetic and/or IV Sedative (Analgesic/Anxiolytic) related.          Effects of local anesthetic: The analgesic effects attained during this period are directly associated to the localized infiltration of local anesthetics and therefore cary significant diagnostic value as to the etiological location, or anatomical origin, of the pain. Expected duration of relief is directly dependent on the pharmacodynamics of the local anesthetic used. Long-acting (4-6 hours) anesthetics used.  Reported result: Relief during the next 4 to 6 hour after the procedure: 10 % (Short-Term Relief)            Interpretative annotation: Clinically appropriate result. Analgesia during this  period is likely to be Local Anesthetic-related.          Long-term benefit: Defined as the period of time past the expected duration of local anesthetics (1 hour for short-acting and 4-6 hours for long-acting). With the possible exception of prolonged sympathetic blockade from the local anesthetics, benefits during this period are typically attributed to, or associated with, other factors such as analgesic sensory neuropraxia, antiinflammatory effects, or beneficial biochemical changes provided by agents other than the local anesthetics.  Reported result: Extended relief following procedure: 0 % (Long-Term Relief)            Interpretative annotation: Clinically appropriate result.  Good relief. No permanent benefit expected. Inflammation plays a part in the etiology to the pain.          Current benefits: Defined as reported results that persistent at this point in time.   Analgesia: 0 %            Function: No benefit ROM: No benefit Interpretative annotation: Recurrence of symptoms.               She denies any numbness at all.   Interpretation: Results would suggest failure of therapy in achieving desired goal(s).                  Plan:  Please see "Plan of Care" for details.                 Laboratory Chemistry  Inflammation Markers (CRP: Acute Phase) (ESR: Chronic Phase) Lab Results  Component Value Date   CRP 1.4 09/08/2017   ESRSEDRATE 11 09/08/2017                 Rheumatology Markers No results found for: RF, ANA, Therisa Doyne, [facility]              Renal Function Markers Lab Results  Component Value Date   BUN 7 09/08/2017   CREATININE 0.84 09/08/2017   GFRAA 93 09/08/2017   GFRNONAA 81 09/08/2017                 Hepatic Function Markers Lab Results  Component Value Date   AST 18 09/08/2017   ALT 12 (L) 01/30/2017   ALBUMIN 4.7 09/08/2017   ALKPHOS 73 09/08/2017                 Electrolytes Lab Results  Component Value Date   NA 139  09/08/2017   K 4.4 09/08/2017   CL 99 09/08/2017   CALCIUM 9.8 09/08/2017   MG 2.1 09/08/2017                 Neuropathy Markers Lab Results  Component Value Date   VITAMINB12 >2000 (H) 09/08/2017                 Bone Pathology Markers Lab Results  Component Value Date   VD25OH 43.74 01/10/2016   25OHVITD1 51 09/08/2017   25OHVITD2 <1.0 09/08/2017   25OHVITD3 51 09/08/2017                 Coagulation Parameters Lab Results  Component Value Date   PLT 202.0 05/20/2017                 Cardiovascular Markers Lab Results  Component Value Date   TROPONINI <0.03 01/30/2017   HGB 13.5 05/20/2017   HCT 40.5 05/20/2017                 CA Markers No results found for: CEA, CA125, LABCA2               Note: Lab results reviewed.  Recent Diagnostic Imaging Results  DG C-Arm 1-60 Min-No Report Fluoroscopy was utilized by the requesting physician.  No radiographic  interpretation.   Complexity Note: Imaging results reviewed. Results shared with Ms. Tye Savoy, using [employer].                         Meds   Current Outpatient Medications:  .  Cyanocobalamin (VITAMIN B-12) 5000 MCG TBDP, Take 2,500 mcg by mouth  daily. , Disp: , Rfl:  .  FLUoxetine (PROZAC) 20 MG capsule, Take 3 capsules (60 mg total) by mouth daily., Disp: 90 capsule, Rfl: 0 .  [START ON 12/12/2017] HYDROcodone-acetaminophen (NORCO/VICODIN) 5-325 MG tablet, Take 1 tablet by mouth 2 (two) times daily., Disp: 60 tablet, Rfl: 0 .  [START ON 12/12/2017] linaclotide (LINZESS) 145 MCG CAPS capsule, Take 1 capsule (145 mcg total) by mouth daily before breakfast., Disp: 30 capsule, Rfl: 0 .  Potassium 99 MG TABS, Take 1 tablet by mouth daily., Disp: , Rfl:  .  tiZANidine (ZANAFLEX) 4 MG capsule, Take 1 capsule (4 mg total) by mouth 3 (three) times daily as needed for muscle spasms., Disp: 90 capsule, Rfl: 2 .  traZODone (DESYREL) 100 MG tablet, Take 3 tablets (300 mg total) by mouth at bedtime., Disp: 90 tablet,  Rfl: 0 .  methocarbamol (ROBAXIN) 500 MG tablet, Take 1 tablet (500 mg total) by mouth every 8 (eight) hours as needed for muscle spasms., Disp: 90 tablet, Rfl: 0  ROS  Constitutional: Denies any fever or chills Gastrointestinal: No reported hemesis, hematochezia, vomiting, or acute GI distress Musculoskeletal: Denies any acute onset joint swelling, redness, loss of ROM, or weakness Neurological: No reported episodes of acute onset apraxia, aphasia, dysarthria, agnosia, amnesia, paralysis, loss of coordination, or loss of consciousness  Allergies  Ms. Owensby is allergic to no known allergies.  [place]  Drug: Ms. Bousquet  reports that she does not use drugs. Alcohol:  reports that she drinks about 0.6 oz of alcohol per week. Tobacco:  reports that she has been smoking cigarettes.  She has a 3.70 pack-year smoking history. she has never used smokeless tobacco. Medical:  has a past medical history of Anxiety, Chronic back pain, Degenerative joint disease, Depression, Hypotension, IBS (irritable bowel syndrome) (05/2015), and MVP (mitral valve prolapse). Surgical: Ms. Zamorano  has a past surgical history that includes Cesarean section; Ankle surgery (Right, 03/02/2014); Hemorrhoid surgery (N/A, 06/16/2015); Cervical spine surgery (02/21/2016); Cesarean section with bilateral tubal ligation (1990); Colonoscopy (04/19/2015); and Hysteroscopy w/D&C (12/24/2013). Family: family history includes Arthritis in her mother; Congestive Heart Failure in her maternal grandfather; Depression in her mother; Diabetes in her maternal aunt; Diabetes Mellitus I in her maternal grandmother; Gout in her maternal aunt; Hearing loss in her father; Heart attack in her maternal grandfather; Hyperlipidemia in her father and mother; Hypertension in her father and mother; Hypothyroidism in her mother; Kidney disease in her mother; Prostate cancer in her paternal grandfather; Stroke in her maternal grandmother; Thyroid disease in  her maternal aunt; Vision loss in her mother.  Constitutional Exam  General appearance: Well nourished, well developed, and well hydrated. In no apparent acute distress Vitals:   12/08/17 1158  BP: 121/76  Pulse: 85  Resp: 16  Temp: 98.6 F (37 C)  TempSrc: Oral  SpO2: 99%  Weight: 140 lb (63.5 kg)  Height: 5' (1.524 m)   BMI Assessment: Estimated body mass index is 27.34 kg/m as calculated from the following:   Height as of this encounter: 5' (1.524 m).   Weight as of this encounter: 140 lb (63.5 kg). Psych/Mental status: Alert, oriented x 3 (person, place, & time)       Eyes: PERLA Respiratory: No evidence of acute respiratory distress  Cervical Spine Area Exam  Skin & Axial Inspection: No masses, redness, edema, swelling, or associated skin lesions Alignment: Symmetrical Functional ROM: Unrestricted ROM      Stability: No instability detected Muscle Tone/Strength: Functionally intact. No  obvious neuro-muscular anomalies detected. Sensory (Neurological): Unimpaired Palpation: No palpable anomalies              Upper Extremity (UE) Exam    Side: Right upper extremity  Side: Left upper extremity  Skin & Extremity Inspection: Skin color, temperature, and hair growth are WNL. No peripheral edema or cyanosis. No masses, redness, swelling, asymmetry, or associated skin lesions. No contractures.  Skin & Extremity Inspection: Skin color, temperature, and hair growth are WNL. No peripheral edema or cyanosis. No masses, redness, swelling, asymmetry, or associated skin lesions. No contractures.  Functional ROM: Unrestricted ROM          Functional ROM: Unrestricted ROM          Muscle Tone/Strength: Functionally intact. No obvious neuro-muscular anomalies detected.  Muscle Tone/Strength: Functionally intact. No obvious neuro-muscular anomalies detected.  Sensory (Neurological): Unimpaired          Sensory (Neurological): Unimpaired          Palpation: No palpable anomalies               Palpation: No palpable anomalies              Specialized Test(s): Deferred         Specialized Test(s): Deferred          Thoracic Spine Area Exam  Skin & Axial Inspection: No masses, redness, or swelling Alignment: Symmetrical Functional ROM: Unrestricted ROM Stability: No instability detected Muscle Tone/Strength: Functionally intact. No obvious neuro-muscular anomalies detected. Sensory (Neurological): Unimpaired Muscle strength & Tone: No palpable anomalies  Lumbar Spine Area Exam  Skin & Axial Inspection: No masses, redness, or swelling Alignment: Symmetrical Functional ROM: Unrestricted ROM      Stability: No instability detected Muscle Tone/Strength: Functionally intact. No obvious neuro-muscular anomalies detected. Sensory (Neurological): Unimpaired Palpation: Complains of area being tender to palpation       Provocative Tests: Lumbar Hyperextension and rotation test: Positive bilaterally for facet joint pain. Lumbar Lateral bending test: evaluation deferred today       Patrick's Maneuver: evaluation deferred today                    Gait & Posture Assessment  Ambulation: Unassisted Gait: Relatively normal for age and body habitus Posture: WNL   Lower Extremity Exam    Side: Right lower extremity  Side: Left lower extremity  Skin & Extremity Inspection: Skin color, temperature, and hair growth are WNL. No peripheral edema or cyanosis. No masses, redness, swelling, asymmetry, or associated skin lesions. No contractures.  Skin & Extremity Inspection: Skin color, temperature, and hair growth are WNL. No peripheral edema or cyanosis. No masses, redness, swelling, asymmetry, or associated skin lesions. No contractures.  Functional ROM: Unrestricted ROM          Functional ROM: Unrestricted ROM          Muscle Tone/Strength: Functionally intact. No obvious neuro-muscular anomalies detected.  Muscle Tone/Strength: Functionally intact. No obvious neuro-muscular anomalies detected.    Sensory (Neurological): Unimpaired  Sensory (Neurological): Unimpaired  Palpation: No palpable anomalies  Palpation: No palpable anomalies   Assessment  Primary Diagnosis & Pertinent Problem List: The primary encounter diagnosis was Lumbar spondylosis. Diagnoses of Spondylosis without myelopathy or radiculopathy, lumbar region, Lumbosacral spondylosis without myelopathy, Chronic low back pain (Primary Area of Pain) (Bilateral) (R>L), Chronic sacroiliac joint pain (Bilateral) (L>R), Chronic musculoskeletal pain, Chronic constipation, Chronic pain syndrome, and Lumbar facet syndrome (Bilateral) (L>R) were also pertinent to this visit.  Status Diagnosis  Controlled Controlled Controlled 1. Lumbar spondylosis   2. Spondylosis without myelopathy or radiculopathy, lumbar region   3. Lumbosacral spondylosis without myelopathy   4. Chronic low back pain (Primary Area of Pain) (Bilateral) (R>L)   5. Chronic sacroiliac joint pain (Bilateral) (L>R)   6. Chronic musculoskeletal pain   7. Chronic constipation   8. Chronic pain syndrome   9. Lumbar facet syndrome (Bilateral) (L>R)     Problems updated and reviewed during this visit: Problem  Lumbar Spondylosis   Plan of Care  Pharmacotherapy (Medications Ordered): Meds ordered this encounter  Medications  . linaclotide (LINZESS) 145 MCG CAPS capsule    Sig: Take 1 capsule (145 mcg total) by mouth daily before breakfast.    Dispense:  30 capsule    Refill:  0    Order Specific Question:   Supervising Provider    AnswerMilinda Pointer [phone]  . HYDROcodone-acetaminophen (NORCO/VICODIN) 5-325 MG tablet    Sig: Take 1 tablet by mouth 2 (two) times daily.    Dispense:  60 tablet    Refill:  0    Fill one day early if pharmacy is closed on scheduled refill date. Do not fill until: 12/12/2017 To last until: 01/11/2018    Order Specific Question:   Supervising Provider    Answer:   Milinda Pointer [phone]  . methocarbamol (ROBAXIN)  500 MG tablet    Sig: Take 1 tablet (500 mg total) by mouth every 8 (eight) hours as needed for muscle spasms.    Dispense:  90 tablet    Refill:  0    Do not place this medication, or any other prescription from our practice, on "Automatic Refill". Patient may have prescription filled one day early if pharmacy is closed on scheduled refill date.    Order Specific Question:   Supervising Provider    Answer:   Milinda Pointer [[id number]]   New Prescriptions   METHOCARBAMOL (ROBAXIN) 500 MG TABLET    Take 1 tablet (500 mg total) by mouth every 8 (eight) hours as needed for muscle spasms.   Medications administered today: Annaya L. Mehl had no medications administered during this visit. Lab-work, procedure(s), and/or referral(s): Orders Placed This Encounter  Procedures  . LUMBAR FACET(MEDIAL BRANCH NERVE BLOCK) MBNB  . SACROILIAC JOINT INJECTION   Imaging and/or referral(s): None  Interventional management options: Planned, scheduled, and/or pending: Diagnostic bilateral lumbar facet block + diagnostic bilateral sacroiliac joint block #2   Considering: Diagnostic bilateral lumbar facet block Possible bilateral lumbar facet RFA Diagnostic bilateral sacroiliac joint block Possible bilateral sacroiliac joint RFA Diagnostic bilateral intra-articular hip joint injection Diagnostic bilateral femoral nerve block + obturator nerve block Possible bilateral femoral nerve + obturator nerve RFA Diagnostic right-sided L1-2lumbar epidural steroid injection Diagnostic right-sided L1-2transforaminal epidural steroid injection Diagnostic bilateralL4-5lumbar transforaminal epidural steroid injection Diagnostic bilateral cervical facet block Possible bilateral cervical facet RFA Diagnostic right-sided cervical epidural steroid injection Diagnostic bilateral greater occipital nerve block Possible bilateral occipital nerve RFA Diagnostic bilateral intra-articular  shoulder joint injection Diagnostic bilateral suprascapular nerve block Possible bilateral suprascapular nerve RFA   PRN Procedures: None at this time      Provider-requested follow-up: Return in about 4 weeks (around 01/05/2018) for MedMgmt with Me Donella Stade Edison Pace).  Future Appointments  Date Time Provider [place]  12/11/2017  1:00 PM Milinda Pointer, MD [facility] None  12/16/2017  3:45 PM Pleas Koch, NP [facility]  12/30/2017 11:30 AM Vevelyn Francois, NP [facility] None  Primary Care Physician: Pleas Koch, NP Location: [facility] Note by: Vevelyn Francois NP Date: 12/08/2017; Time: 1:21 PM  Pain Score Disclaimer: We use the NRS-11 scale. This is a self-reported, subjective measurement of pain severity with only modest accuracy. It is used primarily to identify changes within a particular patient. It must be understood that outpatient pain scales are significantly less accurate that those used for research, where they can be applied under ideal controlled circumstances with minimal exposure to variables. In reality, the score is likely to be a combination of pain intensity and pain affect, where pain affect describes the degree of emotional arousal or changes in action readiness caused by the sensory experience of pain. Factors such as social and work situation, setting, emotional state, anxiety levels, expectation, and prior pain experience may influence pain perception and show large inter-individual differences that may also be affected by time variables.  Patient instructions provided during this appointment: Patient Instructions    ____________________________________________________________________________________________  Medication Rules  Applies to: All patients receiving prescriptions (written or electronic).  Pharmacy of record: Pharmacy where electronic prescriptions will be sent. If written prescriptions are  taken to a different pharmacy, please inform the nursing staff. The pharmacy listed in the electronic medical record should be the one where you would like electronic prescriptions to be sent.  Prescription refills: Only during scheduled appointments. Applies to both, written and electronic prescriptions.  NOTE: The following applies primarily to controlled substances (Opioid* Pain Medications).   Patient's responsibilities: 1. Pain Pills: Bring all pain pills to every appointment (except for procedure appointments). 2. Pill Bottles: Bring pills in original pharmacy bottle. Always bring newest bottle. Bring bottle, even if empty. 3. Medication refills: You are responsible for knowing and keeping track of what medications you need refilled. The day before your appointment, write a list of all prescriptions that need to be refilled. Bring that list to your appointment and give it to the admitting nurse. Prescriptions will be written only during appointments. If you forget a medication, it will not be "Called in", "Faxed", or "electronically sent". You will need to get another appointment to get these prescribed. 4. Prescription Accuracy: You are responsible for carefully inspecting your prescriptions before leaving our office. Have the discharge nurse carefully go over each prescription with you, before taking them home. Make sure that your name is accurately spelled, that your address is correct. Check the name and dose of your medication to make sure it is accurate. Check the number of pills, and the written instructions to make sure they are clear and accurate. Make sure that you are given enough medication to last until your next medication refill appointment. 5. Taking Medication: Take medication as prescribed. Never take more pills than instructed. Never take medication more frequently than prescribed. Taking less pills or less frequently is permitted and encouraged, when it comes to controlled  substances (written prescriptions).  6. Inform other Doctors: Always inform, all of your healthcare providers, of all the medications you take. 7. Pain Medication from other Providers: You are not allowed to accept any additional pain medication from any other Doctor or Healthcare provider. There are two exceptions to this rule. (see below) In the event that you require additional pain medication, you are responsible for notifying us, as stated below. 8. Medication Agreement: You are responsible for carefully reading and following our Medication Agreement. This must be signed before receiving any prescriptions from our practice. Safely store a copy of  your signed Agreement. Violations to the Agreement will result in no further prescriptions. (Additional copies of our Medication Agreement are available upon request.) 9. Laws, Rules, & Regulations: All patients are expected to follow all Federal and [employer], [employer], Rules, [facility]. Ignorance of the Laws does not constitute a valid excuse. The use of any illegal substances is prohibited. 10. Adopted CDC guidelines & recommendations: Target dosing levels will be at or below 60 MME/day. Use of benzodiazepines** is not recommended.  Exceptions: There are only two exceptions to the rule of not receiving pain medications from other Healthcare Providers. 1. Exception #1 (Emergencies): In the event of an emergency (i.e.: accident requiring emergency care), you are allowed to receive additional pain medication. However, you are responsible for: As soon as you are able, call our office [phone], at any time of the day or night, and leave a message stating your name, the date and nature of the emergency, and the name and dose of the medication prescribed. In the event that your call is answered by a member of our staff, make sure to document and save the date, time, and the name of the person that took your information.  2. Exception #2 (Planned  Surgery): In the event that you are scheduled by another doctor or dentist to have any type of surgery or procedure, you are allowed (for a period no longer than 30 days), to receive additional pain medication, for the acute post-op pain. However, in this case, you are responsible for picking up a copy of our "Post-op Pain Management for Surgeons" handout, and giving it to your surgeon or dentist. This document is available at our office, and does not require an appointment to obtain it. Simply go to our office during business hours (Monday-Thursday from 8:00 AM to 4:00 PM) (Friday 8:00 AM to 12:00 Noon) or if you have a scheduled appointment with [place], prior to your surgery, and ask for it by name. In addition, you will need to provide [place] with your name, name of your surgeon, type of surgery, and date of procedure or surgery.  *Opioid medications include: morphine, codeine, oxycodone, oxymorphone, hydrocodone, hydromorphone, meperidine, tramadol, tapentadol, buprenorphine, fentanyl, methadone. **Benzodiazepine medications include: diazepam (Valium), alprazolam (Xanax), clonazepam (Klonopine), lorazepam (Ativan), clorazepate (Tranxene), chlordiazepoxide (Librium), estazolam (Prosom), oxazepam (Serax), temazepam (Restoril), triazolam (Halcion)  ____________________________________________________________________________________________  Sacroiliac (SI) Joint Injection Patient Information  Description: The sacroiliac joint connects the scrum (very low back and tailbone) to the ilium (a pelvic bone which also forms half of the hip joint).  Normally this joint experiences very little motion.  When this joint becomes inflamed or unstable low back and or hip and pelvis pain may result.  Injection of this joint with local anesthetics (numbing medicines) and steroids can provide diagnostic information and reduce pain.  This injection is performed with the aid of x-ray guidance into the tailbone area while you are  lying on your stomach.   You may experience an electrical sensation down the leg while this is being done.  You may also experience numbness.  We also may ask if we are reproducing your normal pain during the injection.  Conditions which may be treated SI injection:   Low back, buttock, hip or leg pain  Preparation for the Injection:  3. Do not eat any solid food or dairy products within 8 hours of your appointment.  4. You may drink clear liquids up to 3 hours before appointment.  Clear liquids include water, black coffee,  juice or soda.  No milk or cream please. 5. You may take your regular medications, including pain medications with a sip of water before your appointment.  Diabetics should hold regular insulin (if take separately) and take 1/2 normal NPH dose the morning of the procedure.  Carry some sugar containing items with you to your appointment. 6. A driver must accompany you and be prepared to drive you home after your procedure. 7. Bring all of your current medications with you. 8. An IV may be inserted and sedation may be given at the discretion of the physician. 9. A blood pressure cuff, EKG and other monitors will often be applied during the procedure.  Some patients may need to have extra oxygen administered for a short period.  10. You will be asked to provide medical information, including your allergies, prior to the procedure.  We must know immediately if you are taking blood thinners (like Coumadin/Warfarin) or if you are allergic to IV iodine contrast (dye).  We must know if you could possible be pregnant.  Possible side effects:   Bleeding from needle site  Infection (rare, may require surgery)  Nerve injury (rare)  Numbness & tingling (temporary)  A brief convulsion or seizure  Light-headedness (temporary)  Pain at injection site (several days)  Decreased blood pressure (temporary)  Weakness in the leg (temporary)   Call if you experience:   New  onset weakness or numbness of an extremity below the injection site that last more than 8 hours.  Hives or difficulty breathing ( go to the emergency room)  Inflammation or drainage at the injection site  Any new symptoms which are concerning to you  Please note:  Although the local anesthetic injected can often make your back/ hip/ buttock/ leg feel good for several hours after the injections, the pain will likely return.  It takes 3-7 days for steroids to work in the sacroiliac area.  You may not notice any pain relief for at least that one week.  If effective, we will often do a series of three injections spaced 3-6 weeks apart to maximally decrease your pain.  After the initial series, we generally will wait some months before a repeat injection of the same type.  If you have any questions, please call [phone] [place]  What are the risk, side effects and possible complications? Generally speaking, most procedures are safe.  However, with any procedure there are risks, side effects, and the possibility of complications.  The risks and complications are dependent upon the sites that are lesioned, or the type of nerve block to be performed.  The closer the procedure is to the spine, the more serious the risks are.  Great care is taken when placing the radio frequency needles, block needles or lesioning probes, but sometimes complications can occur. 1. Infection: Any time there is an injection through the skin, there is a risk of infection.  This is why sterile conditions are used for these blocks.  There are four possible types of infection. 1. Localized skin infection. 2. Central Nervous System Infection-This can be in the form of Meningitis, which can be deadly. 3. Epidural Infections-This can be in the form of an epidural abscess, which can cause pressure inside of the spine, causing compression of the spinal  cord with subsequent paralysis. This would require an emergency surgery to decompress, and there are no guarantees that the patient would recover from the  paralysis. 4. Discitis-This is an infection of the intervertebral discs.  It occurs in about 1% of discography procedures.  It is difficult to treat and it may lead to surgery.        2. Pain: the needles have to go through skin and soft tissues, will cause soreness.       3. Damage to internal structures:  The nerves to be lesioned may be near blood vessels or    other nerves which can be potentially damaged.       4. Bleeding: Bleeding is more common if the patient is taking blood thinners such as  aspirin, Coumadin, Ticiid, Plavix, etc., or if he/she have some genetic predisposition  such as hemophilia. Bleeding into the spinal canal can cause compression of the spinal  cord with subsequent paralysis.  This would require an emergency surgery to  decompress and there are no guarantees that the patient would recover from the  paralysis.       5. Pneumothorax:  Puncturing of a lung is a possibility, every time a needle is introduced in  the area of the chest or upper back.  Pneumothorax refers to free air around the  collapsed lung(s), inside of the thoracic cavity (chest cavity).  Another two possible  complications related to a similar event would include: Hemothorax and Chylothorax.   These are variations of the Pneumothorax, where instead of air around the collapsed  lung(s), you may have blood or chyle, respectively.       6. Spinal headaches: They may occur with any procedures in the area of the spine.       7. Persistent CSF (Cerebro-Spinal Fluid) leakage: This is a rare problem, but may occur  with prolonged intrathecal or epidural catheters either due to the formation of a fistulous  track or a dural tear.       8. Nerve damage: By working so close to the spinal cord, there is always a possibility of  nerve damage, which could be as serious as a  permanent spinal cord injury with  paralysis.       9. Death:  Although rare, severe deadly allergic reactions known as "Anaphylactic  reaction" can occur to any of the medications used.      10. Worsening of the symptoms:  We can always make thing worse.  What are the chances of something like this happening? Chances of any of this occuring are extremely low.  By statistics, you have more of a chance of getting killed in a motor vehicle accident: while driving to the hospital than any of the above occurring .  Nevertheless, you should be aware that they are possibilities.  In general, it is similar to taking a shower.  Everybody knows that you can slip, hit your head and get killed.  Does that mean that you should not shower again?  Nevertheless always keep in mind that statistics do not mean anything if you happen to be on the wrong side of them.  Even if a procedure has a 1 (one) in a 1,000,000 (million) chance of going wrong, it you happen to be that one..Also, keep in mind that by statistics, you have more of a chance of having something go wrong when taking medications.  Who should not have this procedure? If you are on a blood thinning medication (e.g. Coumadin, Plavix, see list of "Blood Thinners"), or if you have an active infection going on, you should not have the procedure.  If  you are taking any blood thinners, please inform your physician.  How should I prepare for this procedure?  Do not eat or drink anything at least six hours prior to the procedure.  Bring a driver with you .  It cannot be a taxi.  Come accompanied by an adult that can drive you back, and that is strong enough to help you if your legs get weak or numb from the local anesthetic.  Take all of your medicines the morning of the procedure with just enough water to swallow them.  If you have diabetes, make sure that you are scheduled to have your procedure done first thing in the morning, whenever possible.  If you  have diabetes, take only half of your insulin dose and notify our nurse that you have done so as soon as you arrive at the clinic.  If you are diabetic, but only take blood sugar pills (oral hypoglycemic), then do not take them on the morning of your procedure.  You may take them after you have had the procedure.  Do not take aspirin or any aspirin-containing medications, at least eleven (11) days prior to the procedure.  They may prolong bleeding.  Wear loose fitting clothing that may be easy to take off and that you would not mind if it got stained with Betadine or blood.  Do not wear any jewelry or perfume  Remove any nail coloring.  It will interfere with some of our monitoring equipment.  NOTE: Remember that this is not meant to be interpreted as a complete list of all possible complications.  Unforeseen problems may occur.  BLOOD THINNERS The following drugs contain aspirin or other products, which can cause increased bleeding during surgery and should not be taken for 2 weeks prior to and 1 week after surgery.  If you should need take something for relief of minor pain, you may take acetaminophen which is found in Tylenol,m Datril, Anacin-3 and Panadol. It is not blood thinner. The products listed below are.  Do not take any of the products listed below in addition to any listed on your instruction sheet.  A.P.C or A.P.C with Codeine Codeine Phosphate Capsules #3 Ibuprofen Ridaura  ABC compound Congesprin Imuran rimadil  Advil Cope Indocin Robaxisal  Alka-Seltzer Effervescent Pain Reliever and Antacid Coricidin or Coricidin-D  Indomethacin Rufen  Alka-Seltzer plus Cold Medicine Cosprin Ketoprofen S-A-C Tablets  Anacin Analgesic Tablets or Capsules Coumadin Korlgesic Salflex  Anacin Extra Strength Analgesic tablets or capsules CP-2 Tablets Lanoril Salicylate  Anaprox Cuprimine Capsules Levenox Salocol  Anexsia-D Dalteparin Magan Salsalate  Anodynos Darvon compound Magnesium  Salicylate Sine-off  Ansaid Dasin Capsules Magsal Sodium Salicylate  Anturane Depen Capsules Marnal Soma  APF Arthritis pain formula Dewitt's Pills Measurin Stanback  Argesic Dia-Gesic Meclofenamic Sulfinpyrazone  Arthritis Bayer Timed Release Aspirin Diclofenac Meclomen Sulindac  Arthritis pain formula Anacin Dicumarol Medipren Supac  Analgesic (Safety coated) Arthralgen Diffunasal Mefanamic Suprofen  Arthritis Strength Bufferin Dihydrocodeine Mepro Compound Suprol  Arthropan liquid Dopirydamole Methcarbomol with Aspirin Synalgos  ASA tablets/Enseals Disalcid Micrainin Tagament  Ascriptin Doan's Midol Talwin  Ascriptin A/D Dolene Mobidin Tanderil  Ascriptin Extra Strength Dolobid Moblgesic Ticlid  Ascriptin with Codeine Doloprin or Doloprin with Codeine Momentum Tolectin  Asperbuf Duoprin Mono-gesic Trendar  Aspergum Duradyne Motrin or Motrin IB Triminicin  Aspirin plain, buffered or enteric coated Durasal Myochrisine Trigesic  Aspirin Suppositories Easprin Nalfon Trillsate  Aspirin with Codeine Ecotrin Regular or Extra Strength Naprosyn Uracel  Atromid-S Efficin Naproxen Ursinus  Auranofin  Capsules Elmiron Neocylate Vanquish  Axotal Emagrin Norgesic Verin  Azathioprine Empirin or Empirin with Codeine Normiflo Vitamin E  Azolid Emprazil Nuprin Voltaren  Bayer Aspirin plain, buffered or children's or timed BC Tablets or powders Encaprin Orgaran Warfarin Sodium  Buff-a-Comp Enoxaparin Orudis Zorpin  Buff-a-Comp with Codeine Equegesic Os-Cal-Gesic   Buffaprin Excedrin plain, buffered or Extra Strength Oxalid   Bufferin Arthritis Strength Feldene Oxphenbutazone   Bufferin plain or Extra Strength Feldene Capsules Oxycodone with Aspirin   Bufferin with Codeine Fenoprofen Fenoprofen Pabalate or Pabalate-SF   Buffets II Flogesic Panagesic   Buffinol plain or Extra Strength Florinal or Florinal with Codeine Panwarfarin   Buf-Tabs Flurbiprofen Penicillamine   Butalbital Compound Four-way  cold tablets Penicillin   Butazolidin Fragmin Pepto-Bismol   Carbenicillin Geminisyn Percodan   Carna Arthritis Reliever Geopen Persantine   Carprofen Gold's salt Persistin   Chloramphenicol Goody's Phenylbutazone   Chloromycetin Haltrain Piroxlcam   Clmetidine heparin Plaquenil   Cllnoril Hyco-pap Ponstel   Clofibrate Hydroxy chloroquine Propoxyphen         Before stopping any of these medications, be sure to consult the physician who ordered them.  Some, such as Coumadin (Warfarin) are ordered to prevent or treat serious conditions such as "deep thrombosis", "pumonary embolisms", and other heart problems.  The amount of time that you may need off of the medication may also vary with the medication and the reason for which you were taking it.  If you are taking any of these medications, please make sure you notify your pain physician before you undergo any procedures.          Facet Joint Block The facet joints connect the bones of the spine (vertebrae). They make it possible for you to bend, twist, and make other movements with your spine. They also keep you from bending too far, twisting too far, and making other excessive movements. A facet joint block is a procedure where a numbing medicine (anesthetic) is injected into a facet joint. Often, a type of anti-inflammatory medicine called a steroid is also injected. A facet joint block may be done to diagnose neck or back pain. If the pain gets better after a facet joint block, it means the pain is probably coming from the facet joint. If the pain does not get better, it means the pain is probably not coming from the facet joint. A facet joint block may also be done to relieve neck or back pain caused by an inflamed facet joint. A facet joint block is only done to relieve pain if the pain does not improve with other methods, such as medicine, exercise programs, and physical therapy. Tell a health care provider about:  Any allergies you  have.  All medicines you are taking, including vitamins, herbs, eye drops, creams, and over-the-counter medicines.  Any problems you or family members have had with anesthetic medicines.  Any blood disorders you have.  Any surgeries you have had.  Any medical conditions you have.  Whether you are pregnant or may be pregnant. What are the risks? Generally, this is a safe procedure. However, problems may occur, including:  Bleeding.  Injury to a nerve near the injection site.  Pain at the injection site.  Weakness or numbness in areas controlled by nerves near the injection site.  Infection.  Temporary fluid retention.  Allergic reactions to medicines or dyes.  Injury to other structures or organs near the injection site.  What happens before the procedure?  Follow instructions from your  health care provider about eating or drinking restrictions.  Ask your health care provider about: ? Changing or stopping your regular medicines. This is especially important if you are taking diabetes medicines or blood thinners. ? Taking medicines such as aspirin and ibuprofen. These medicines can thin your blood. Do not take these medicines before your procedure if your health care provider instructs you not to.  Do not take any new dietary supplements or medicines without asking your health care provider first.  Plan to have someone take you home after the procedure. What happens during the procedure?  You may need to remove your clothing and dress in an open-back gown.  The procedure will be done while you are lying on an X-ray table. You will most likely be asked to lie on your stomach, but you may be asked to lie in a different position if an injection will be made in your neck.  Machines will be used to monitor your oxygen levels, heart rate, and blood pressure.  If an injection will be made in your neck, an IV tube will be inserted into one of your veins. Fluids and medicine  will flow directly into your body through the IV tube.  The area over the facet joint where the injection will be made will be cleaned with soap. The surrounding skin will be covered with clean drapes.  A numbing medicine (local anesthetic) will be applied to your skin. Your skin may sting or burn for a moment.  A video X-ray machine (fluoroscopy) will be used to locate the joint. In some cases, a CT scan may be used.  A contrast dye may be injected into the facet joint area to help locate the joint.  When the joint is located, an anesthetic will be injected into the joint through the needle.  Your health care provider will ask you whether you feel pain relief. If you do feel relief, a steroid may be injected to provide pain relief for a longer period of time. If you do not feel relief or feel only partial relief, additional injections of an anesthetic may be made in other facet joints.  The needle will be removed.  Your skin will be cleaned.  A bandage (dressing) will be applied over each injection site. The procedure may vary among health care providers and hospitals. What happens after the procedure?  You will be observed for 15-30 minutes before being allowed to go home. This information is not intended to replace advice given to you by your health care provider. Make sure you discuss any questions you have with your health care provider. Document Released: 03/05/2007 Document Revised: 11/15/2015 Document Reviewed: 07/10/2015 [employer]vier Interactive Patient Education  [employer].

## 2017-12-08 NOTE — Patient Instructions (Addendum)
____________________________________________________________________________________________  Medication Rules  Applies to: All patients receiving prescriptions (written or electronic).  Pharmacy of record: Pharmacy where electronic prescriptions will be sent. If written prescriptions are taken to a different pharmacy, please inform the nursing staff. The pharmacy listed in the electronic medical record should be the one where you would like electronic prescriptions to be sent.  Prescription refills: Only during scheduled appointments. Applies to both, written and electronic prescriptions.  NOTE: The following applies primarily to controlled substances (Opioid* Pain Medications).   Patient's responsibilities: 1. Pain Pills: Bring all pain pills to every appointment (except for procedure appointments). 2. Pill Bottles: Bring pills in original pharmacy bottle. Always bring newest bottle. Bring bottle, even if empty. 3. Medication refills: You are responsible for knowing and keeping track of what medications you need refilled. The day before your appointment, write a list of all prescriptions that need to be refilled. Bring that list to your appointment and give it to the admitting nurse. Prescriptions will be written only during appointments. If you forget a medication, it will not be "Called in", "Faxed", or "electronically sent". You will need to get another appointment to get these prescribed. 4. Prescription Accuracy: You are responsible for carefully inspecting your prescriptions before leaving our office. Have the discharge nurse carefully go over each prescription with you, before taking them home. Make sure that your name is accurately spelled, that your address is correct. Check the name and dose of your medication to make sure it is accurate. Check the number of pills, and the written instructions to make sure they are clear and accurate. Make sure that you are given enough medication to  last until your next medication refill appointment. 5. Taking Medication: Take medication as prescribed. Never take more pills than instructed. Never take medication more frequently than prescribed. Taking less pills or less frequently is permitted and encouraged, when it comes to controlled substances (written prescriptions).  6. Inform other Doctors: Always inform, all of your healthcare providers, of all the medications you take. 7. Pain Medication from other Providers: You are not allowed to accept any additional pain medication from any other Doctor or Healthcare provider. There are two exceptions to this rule. (see below) In the event that you require additional pain medication, you are responsible for notifying us, as stated below. 8. Medication Agreement: You are responsible for carefully reading and following our Medication Agreement. This must be signed before receiving any prescriptions from our practice. Safely store a copy of your signed Agreement. Violations to the Agreement will result in no further prescriptions. (Additional copies of our Medication Agreement are available upon request.) 9. Laws, Rules, & Regulations: All patients are expected to follow all Federal and State Laws, Statutes, Rules, & Regulations. Ignorance of the Laws does not constitute a valid excuse. The use of any illegal substances is prohibited. 10. Adopted CDC guidelines & recommendations: Target dosing levels will be at or below 60 MME/day. Use of benzodiazepines** is not recommended.  Exceptions: There are only two exceptions to the rule of not receiving pain medications from other Healthcare Providers. 1. Exception #1 (Emergencies): In the event of an emergency (i.e.: accident requiring emergency care), you are allowed to receive additional pain medication. However, you are responsible for: As soon as you are able, call our office [phone], at any time of the day or night, and leave a message stating your  name, the date and nature of the emergency, and the name and dose of the medication   prescribed. In the event that your call is answered by a member of our staff, make sure to document and save the date, time, and the name of the person that took your information.  2. Exception #2 (Planned Surgery): In the event that you are scheduled by another doctor or dentist to have any type of surgery or procedure, you are allowed (for a period no longer than 30 days), to receive additional pain medication, for the acute post-op pain. However, in this case, you are responsible for picking up a copy of our "Post-op Pain Management for Surgeons" handout, and giving it to your surgeon or dentist. This document is available at our office, and does not require an appointment to obtain it. Simply go to our office during business hours (Monday-Thursday from 8:00 AM to 4:00 PM) (Friday 8:00 AM to 12:00 Noon) or if you have a scheduled appointment with [place], prior to your surgery, and ask for it by name. In addition, you will need to provide [place] with your name, name of your surgeon, type of surgery, and date of procedure or surgery.  *Opioid medications include: morphine, codeine, oxycodone, oxymorphone, hydrocodone, hydromorphone, meperidine, tramadol, tapentadol, buprenorphine, fentanyl, methadone. **Benzodiazepine medications include: diazepam (Valium), alprazolam (Xanax), clonazepam (Klonopine), lorazepam (Ativan), clorazepate (Tranxene), chlordiazepoxide (Librium), estazolam (Prosom), oxazepam (Serax), temazepam (Restoril), triazolam (Halcion)  ____________________________________________________________________________________________  Sacroiliac (SI) Joint Injection Patient Information  Description: The sacroiliac joint connects the scrum (very low back and tailbone) to the ilium (a pelvic bone which also forms half of the hip joint).  Normally this joint experiences very little motion.  When this joint becomes inflamed  or unstable low back and or hip and pelvis pain may result.  Injection of this joint with local anesthetics (numbing medicines) and steroids can provide diagnostic information and reduce pain.  This injection is performed with the aid of x-ray guidance into the tailbone area while you are lying on your stomach.   You may experience an electrical sensation down the leg while this is being done.  You may also experience numbness.  We also may ask if we are reproducing your normal pain during the injection.  Conditions which may be treated SI injection:   Low back, buttock, hip or leg pain  Preparation for the Injection:  3. Do not eat any solid food or dairy products within 8 hours of your appointment.  4. You may drink clear liquids up to 3 hours before appointment.  Clear liquids include water, black coffee, juice or soda.  No milk or cream please. 5. You may take your regular medications, including pain medications with a sip of water before your appointment.  Diabetics should hold regular insulin (if take separately) and take 1/2 normal NPH dose the morning of the procedure.  Carry some sugar containing items with you to your appointment. 6. A driver must accompany you and be prepared to drive you home after your procedure. 7. Bring all of your current medications with you. 8. An IV may be inserted and sedation may be given at the discretion of the physician. 9. A blood pressure cuff, EKG and other monitors will often be applied during the procedure.  Some patients may need to have extra oxygen administered for a short period.  10. You will be asked to provide medical information, including your allergies, prior to the procedure.  We must know immediately if you are taking blood thinners (like Coumadin/Warfarin) or if you are allergic to IV iodine contrast (dye).  We  must know if you could possible be pregnant.  Possible side effects:   Bleeding from needle site  Infection (rare, may  require surgery)  Nerve injury (rare)  Numbness & tingling (temporary)  A brief convulsion or seizure  Light-headedness (temporary)  Pain at injection site (several days)  Decreased blood pressure (temporary)  Weakness in the leg (temporary)   Call if you experience:   New onset weakness or numbness of an extremity below the injection site that last more than 8 hours.  Hives or difficulty breathing ( go to the emergency room)  Inflammation or drainage at the injection site  Any new symptoms which are concerning to you  Please note:  Although the local anesthetic injected can often make your back/ hip/ buttock/ leg feel good for several hours after the injections, the pain will likely return.  It takes 3-7 days for steroids to work in the sacroiliac area.  You may not notice any pain relief for at least that one week.  If effective, we will often do a series of three injections spaced 3-6 weeks apart to maximally decrease your pain.  After the initial series, we generally will wait some months before a repeat injection of the same type.  If you have any questions, please call [phone] [place]  What are the risk, side effects and possible complications? Generally speaking, most procedures are safe.  However, with any procedure there are risks, side effects, and the possibility of complications.  The risks and complications are dependent upon the sites that are lesioned, or the type of nerve block to be performed.  The closer the procedure is to the spine, the more serious the risks are.  Great care is taken when placing the radio frequency needles, block needles or lesioning probes, but sometimes complications can occur. 1. Infection: Any time there is an injection through the skin, there is a risk of infection.  This is why sterile conditions are used for these blocks.  There are four possible types of  infection. 1. Localized skin infection. 2. Central Nervous System Infection-This can be in the form of Meningitis, which can be deadly. 3. Epidural Infections-This can be in the form of an epidural abscess, which can cause pressure inside of the spine, causing compression of the spinal cord with subsequent paralysis. This would require an emergency surgery to decompress, and there are no guarantees that the patient would recover from the paralysis. 4. Discitis-This is an infection of the intervertebral discs.  It occurs in about 1% of discography procedures.  It is difficult to treat and it may lead to surgery.        2. Pain: the needles have to go through skin and soft tissues, will cause soreness.       3. Damage to internal structures:  The nerves to be lesioned may be near blood vessels or    other nerves which can be potentially damaged.       4. Bleeding: Bleeding is more common if the patient is taking blood thinners such as  aspirin, Coumadin, Ticiid, Plavix, etc., or if he/she have some genetic predisposition  such as hemophilia. Bleeding into the spinal canal can cause compression of the spinal  cord with subsequent paralysis.  This would require an emergency surgery to  decompress and there are no guarantees that the patient would recover from the  paralysis.       5. Pneumothorax:  Puncturing of a lung is a possibility, every time a needle is introduced in  the area of the chest or upper back.  Pneumothorax refers to free air around the  collapsed lung(s), inside of the thoracic cavity (chest cavity).  Another two possible  complications related to a similar event would include: Hemothorax and Chylothorax.   These are variations of the Pneumothorax, where instead of air around the collapsed  lung(s), you may have blood or chyle, respectively.       6. Spinal headaches: They may occur with any procedures in the area of the spine.       7. Persistent CSF (Cerebro-Spinal Fluid) leakage: This is  a rare problem, but may occur  with prolonged intrathecal or epidural catheters either due to the formation of a fistulous  track or a dural tear.       8. Nerve damage: By working so close to the spinal cord, there is always a possibility of  nerve damage, which could be as serious as a permanent spinal cord injury with  paralysis.       9. Death:  Although rare, severe deadly allergic reactions known as "Anaphylactic  reaction" can occur to any of the medications used.      10. Worsening of the symptoms:  We can always make thing worse.  What are the chances of something like this happening? Chances of any of this occuring are extremely low.  By statistics, you have more of a chance of getting killed in a motor vehicle accident: while driving to the hospital than any of the above occurring .  Nevertheless, you should be aware that they are possibilities.  In general, it is similar to taking a shower.  Everybody knows that you can slip, hit your head and get killed.  Does that mean that you should not shower again?  Nevertheless always keep in mind that statistics do not mean anything if you happen to be on the wrong side of them.  Even if a procedure has a 1 (one) in a 1,000,000 (million) chance of going wrong, it you happen to be that one..Also, keep in mind that by statistics, you have more of a chance of having something go wrong when taking medications.  Who should not have this procedure? If you are on a blood thinning medication (e.g. Coumadin, Plavix, see list of "Blood Thinners"), or if you have an active infection going on, you should not have the procedure.  If you are taking any blood thinners, please inform your physician.  How should I prepare for this procedure?  Do not eat or drink anything at least six hours prior to the procedure.  Bring a driver with you .  It cannot be a taxi.  Come accompanied by an adult that can drive you back, and that is strong enough to help you if your  legs get weak or numb from the local anesthetic.  Take all of your medicines the morning of the procedure with just enough water to swallow them.  If you have diabetes, make sure that you are scheduled to have your procedure done first thing in the morning, whenever possible.  If you have diabetes, take only half of your insulin dose and notify our nurse that you have done so as soon as you arrive at the clinic.  If you are diabetic, but only take blood sugar pills (oral hypoglycemic), then do not take them on the morning of your procedure.  You  may take them after you have had the procedure.  Do not take aspirin or any aspirin-containing medications, at least eleven (11) days prior to the procedure.  They may prolong bleeding.  Wear loose fitting clothing that may be easy to take off and that you would not mind if it got stained with Betadine or blood.  Do not wear any jewelry or perfume  Remove any nail coloring.  It will interfere with some of our monitoring equipment.  NOTE: Remember that this is not meant to be interpreted as a complete list of all possible complications.  Unforeseen problems may occur.  BLOOD THINNERS The following drugs contain aspirin or other products, which can cause increased bleeding during surgery and should not be taken for 2 weeks prior to and 1 week after surgery.  If you should need take something for relief of minor pain, you may take acetaminophen which is found in Tylenol,m Datril, Anacin-3 and Panadol. It is not blood thinner. The products listed below are.  Do not take any of the products listed below in addition to any listed on your instruction sheet.  A.P.C or A.P.C with Codeine Codeine Phosphate Capsules #3 Ibuprofen Ridaura  ABC compound Congesprin Imuran rimadil  Advil Cope Indocin Robaxisal  Alka-Seltzer Effervescent Pain Reliever and Antacid Coricidin or Coricidin-D  Indomethacin Rufen  Alka-Seltzer plus Cold Medicine Cosprin Ketoprofen S-A-C  Tablets  Anacin Analgesic Tablets or Capsules Coumadin Korlgesic Salflex  Anacin Extra Strength Analgesic tablets or capsules CP-2 Tablets Lanoril Salicylate  Anaprox Cuprimine Capsules Levenox Salocol  Anexsia-D Dalteparin Magan Salsalate  Anodynos Darvon compound Magnesium Salicylate Sine-off  Ansaid Dasin Capsules Magsal Sodium Salicylate  Anturane Depen Capsules Marnal Soma  APF Arthritis pain formula Dewitt's Pills Measurin Stanback  Argesic Dia-Gesic Meclofenamic Sulfinpyrazone  Arthritis Bayer Timed Release Aspirin Diclofenac Meclomen Sulindac  Arthritis pain formula Anacin Dicumarol Medipren Supac  Analgesic (Safety coated) Arthralgen Diffunasal Mefanamic Suprofen  Arthritis Strength Bufferin Dihydrocodeine Mepro Compound Suprol  Arthropan liquid Dopirydamole Methcarbomol with Aspirin Synalgos  ASA tablets/Enseals Disalcid Micrainin Tagament  Ascriptin Doan's Midol Talwin  Ascriptin A/D Dolene Mobidin Tanderil  Ascriptin Extra Strength Dolobid Moblgesic Ticlid  Ascriptin with Codeine Doloprin or Doloprin with Codeine Momentum Tolectin  Asperbuf Duoprin Mono-gesic Trendar  Aspergum Duradyne Motrin or Motrin IB Triminicin  Aspirin plain, buffered or enteric coated Durasal Myochrisine Trigesic  Aspirin Suppositories Easprin Nalfon Trillsate  Aspirin with Codeine Ecotrin Regular or Extra Strength Naprosyn Uracel  Atromid-S Efficin Naproxen Ursinus  Auranofin Capsules Elmiron Neocylate Vanquish  Axotal Emagrin Norgesic Verin  Azathioprine Empirin or Empirin with Codeine Normiflo Vitamin E  Azolid Emprazil Nuprin Voltaren  Bayer Aspirin plain, buffered or children's or timed BC Tablets or powders Encaprin Orgaran Warfarin Sodium  Buff-a-Comp Enoxaparin Orudis Zorpin  Buff-a-Comp with Codeine Equegesic Os-Cal-Gesic   Buffaprin Excedrin plain, buffered or Extra Strength Oxalid   Bufferin Arthritis Strength Feldene Oxphenbutazone   Bufferin plain or Extra Strength Feldene Capsules  Oxycodone with Aspirin   Bufferin with Codeine Fenoprofen Fenoprofen Pabalate or Pabalate-SF   Buffets II Flogesic Panagesic   Buffinol plain or Extra Strength Florinal or Florinal with Codeine Panwarfarin   Buf-Tabs Flurbiprofen Penicillamine   Butalbital Compound Four-way cold tablets Penicillin   Butazolidin Fragmin Pepto-Bismol   Carbenicillin Geminisyn Percodan   Carna Arthritis Reliever Geopen Persantine   Carprofen Gold's salt Persistin   Chloramphenicol Goody's Phenylbutazone   Chloromycetin Haltrain Piroxlcam   Clmetidine heparin Plaquenil   Cllnoril Hyco-pap Ponstel   Clofibrate Hydroxy chloroquine Propoxyphen  Before stopping any of these medications, be sure to consult the physician who ordered them.  Some, such as Coumadin (Warfarin) are ordered to prevent or treat serious conditions such as "deep thrombosis", "pumonary embolisms", and other heart problems.  The amount of time that you may need off of the medication may also vary with the medication and the reason for which you were taking it.  If you are taking any of these medications, please make sure you notify your pain physician before you undergo any procedures.          Facet Joint Block The facet joints connect the bones of the spine (vertebrae). They make it possible for you to bend, twist, and make other movements with your spine. They also keep you from bending too far, twisting too far, and making other excessive movements. A facet joint block is a procedure where a numbing medicine (anesthetic) is injected into a facet joint. Often, a type of anti-inflammatory medicine called a steroid is also injected. A facet joint block may be done to diagnose neck or back pain. If the pain gets better after a facet joint block, it means the pain is probably coming from the facet joint. If the pain does not get better, it means the pain is probably not coming from the facet joint. A facet joint block may also be done  to relieve neck or back pain caused by an inflamed facet joint. A facet joint block is only done to relieve pain if the pain does not improve with other methods, such as medicine, exercise programs, and physical therapy. Tell a health care provider about:  Any allergies you have.  All medicines you are taking, including vitamins, herbs, eye drops, creams, and over-the-counter medicines.  Any problems you or family members have had with anesthetic medicines.  Any blood disorders you have.  Any surgeries you have had.  Any medical conditions you have.  Whether you are pregnant or may be pregnant. What are the risks? Generally, this is a safe procedure. However, problems may occur, including:  Bleeding.  Injury to a nerve near the injection site.  Pain at the injection site.  Weakness or numbness in areas controlled by nerves near the injection site.  Infection.  Temporary fluid retention.  Allergic reactions to medicines or dyes.  Injury to other structures or organs near the injection site.  What happens before the procedure?  Follow instructions from your health care provider about eating or drinking restrictions.  Ask your health care provider about: ? Changing or stopping your regular medicines. This is especially important if you are taking diabetes medicines or blood thinners. ? Taking medicines such as aspirin and ibuprofen. These medicines can thin your blood. Do not take these medicines before your procedure if your health care provider instructs you not to.  Do not take any new dietary supplements or medicines without asking your health care provider first.  Plan to have someone take you home after the procedure. What happens during the procedure?  You may need to remove your clothing and dress in an open-back gown.  The procedure will be done while you are lying on an X-ray table. You will most likely be asked to lie on your stomach, but you may be asked to  lie in a different position if an injection will be made in your neck.  Machines will be used to monitor your oxygen levels, heart rate, and blood pressure.  If an injection will be made  in your neck, an IV tube will be inserted into one of your veins. Fluids and medicine will flow directly into your body through the IV tube.  The area over the facet joint where the injection will be made will be cleaned with soap. The surrounding skin will be covered with clean drapes.  A numbing medicine (local anesthetic) will be applied to your skin. Your skin may sting or burn for a moment.  A video X-ray machine (fluoroscopy) will be used to locate the joint. In some cases, a CT scan may be used.  A contrast dye may be injected into the facet joint area to help locate the joint.  When the joint is located, an anesthetic will be injected into the joint through the needle.  Your health care provider will ask you whether you feel pain relief. If you do feel relief, a steroid may be injected to provide pain relief for a longer period of time. If you do not feel relief or feel only partial relief, additional injections of an anesthetic may be made in other facet joints.  The needle will be removed.  Your skin will be cleaned.  A bandage (dressing) will be applied over each injection site. The procedure may vary among health care providers and hospitals. What happens after the procedure?  You will be observed for 15-30 minutes before being allowed to go home. This information is not intended to replace advice given to you by your health care provider. Make sure you discuss any questions you have with your health care provider. Document Released: 03/05/2007 Document Revised: 11/15/2015 Document Reviewed: 07/10/2015 [employer]vier Interactive Patient Education  [employer].

## 2017-12-10 ENCOUNTER — Ambulatory Visit: Payer: PPO | Admitting: Pain Medicine

## 2017-12-11 ENCOUNTER — Ambulatory Visit
Admission: RE | Admit: 2017-12-11 | Discharge: 2017-12-11 | Disposition: A | Discharge status: Home / Self Care | Payer: PPO | Source: Ambulatory Visit | Attending: Pain Medicine | Admitting: Pain Medicine

## 2017-12-11 ENCOUNTER — Encounter: Payer: Self-pay | Admitting: Pain Medicine

## 2017-12-11 ENCOUNTER — Other Ambulatory Visit: Payer: Self-pay

## 2017-12-11 ENCOUNTER — Ambulatory Visit (HOSPITAL_BASED_OUTPATIENT_CLINIC_OR_DEPARTMENT_OTHER): Payer: PPO | Admitting: Pain Medicine

## 2017-12-11 VITALS — BP 138/79 | HR 69 | Temp 97.3°F | Resp 22 | Ht 61.0 in | Wt 140.0 lb

## 2017-12-11 DIAGNOSIS — M47816 Spondylosis without myelopathy or radiculopathy, lumbar region: Secondary | ICD-10-CM | POA: Diagnosis not present

## 2017-12-11 DIAGNOSIS — M545 Low back pain: Secondary | ICD-10-CM | POA: Diagnosis not present

## 2017-12-11 DIAGNOSIS — G8929 Other chronic pain: Secondary | ICD-10-CM | POA: Insufficient documentation

## 2017-12-11 DIAGNOSIS — M533 Sacrococcygeal disorders, not elsewhere classified: Secondary | ICD-10-CM

## 2017-12-11 DIAGNOSIS — M5441 Lumbago with sciatica, right side: Secondary | ICD-10-CM

## 2017-12-11 DIAGNOSIS — M5442 Lumbago with sciatica, left side: Secondary | ICD-10-CM

## 2017-12-11 MED ORDER — TRIAMCINOLONE ACETONIDE 40 MG/ML IJ SUSP
40.0000 mg | Freq: Once | INTRAMUSCULAR | Status: AC
  Administered 2017-12-11: 40 mg
  Filled 2017-12-11: qty 1

## 2017-12-11 MED ORDER — LACTATED RINGERS IV SOLN
1000.0000 mL | Freq: Once | INTRAVENOUS | Status: AC
  Administered 2017-12-11: 1000 mL via INTRAVENOUS

## 2017-12-11 MED ORDER — FENTANYL CITRATE (PF) 100 MCG/2ML IJ SOLN
25.0000 ug | INTRAMUSCULAR | Status: DC | PRN
  Administered 2017-12-11: 100 ug via INTRAVENOUS
  Filled 2017-12-11: qty 2

## 2017-12-11 MED ORDER — ROPIVACAINE HCL 2 MG/ML IJ SOLN
9.0000 mL | Freq: Once | INTRAMUSCULAR | Status: AC
  Administered 2017-12-11: 10 mL via PERINEURAL
  Filled 2017-12-11: qty 10

## 2017-12-11 MED ORDER — MIDAZOLAM HCL 5 MG/5ML IJ SOLN
1.0000 mg | INTRAMUSCULAR | Status: DC | PRN
  Administered 2017-12-11: 3 mg via INTRAVENOUS
  Filled 2017-12-11: qty 5

## 2017-12-11 MED ORDER — METHYLPREDNISOLONE ACETATE 80 MG/ML IJ SUSP
80.0000 mg | Freq: Once | INTRAMUSCULAR | Status: AC
  Administered 2017-12-11: 80 mg via INTRA_ARTICULAR
  Filled 2017-12-11: qty 1

## 2017-12-11 MED ORDER — LIDOCAINE HCL 2 % IJ SOLN
10.0000 mL | Freq: Once | INTRAMUSCULAR | Status: AC
  Administered 2017-12-11: 400 mg
  Filled 2017-12-11: qty 40

## 2017-12-11 MED ORDER — ROPIVACAINE HCL 2 MG/ML IJ SOLN
9.0000 mL | Freq: Once | INTRAMUSCULAR | Status: AC
  Administered 2017-12-11: 10 mL via INTRA_ARTICULAR
  Filled 2017-12-11: qty 10

## 2017-12-11 NOTE — Patient Instructions (Addendum)
Pain Management Discharge Instructions  General Discharge Instructions :  If you need to reach your doctor call: Monday-Friday 8:00 am - 4:00 pm at [phone] or toll free [phone].  After clinic hours [phone] to have operator reach doctor.  Bring all of your medication bottles to all your appointments in the pain clinic.  To cancel or reschedule your appointment with Pain Management please remember to call 24 hours in advance to avoid a fee.  Refer to the educational materials which you have been given on: General Risks, I had my Procedure. Discharge Instructions, Post Sedation.  Post Procedure Instructions:  The drugs you were given will stay in your system until tomorrow, so for the next 24 hours you should not drive, make any legal decisions or drink any alcoholic beverages.  You may eat anything you prefer, but it is better to start with liquids then soups and crackers, and gradually work up to solid foods.  Please notify your doctor immediately if you have any unusual bleeding, trouble breathing or pain that is not related to your normal pain.  Depending on the type of procedure that was done, some parts of your body may feel week and/or numb.  This usually clears up by tonight or the next day.  Walk with the use of an assistive device or accompanied by an adult for the 24 hours.  You may use ice on the affected area for the first 24 hours.  Put ice in a Ziploc bag and cover with a towel and place against area 15 minutes on 15 minutes off.  You may switch to heat after 24 hours.  ____________________________________________________________________________________________  Post-Procedure instructions Instructions:  Apply ice: Fill a plastic sandwich bag with crushed ice. Cover it with a small towel and apply to injection site. Apply for 15 minutes then remove x 15 minutes. Repeat sequence on day of procedure, until you go to bed. The purpose is to minimize swelling  and discomfort after procedure.  Apply heat: Apply heat to procedure site starting the day following the procedure. The purpose is to treat any soreness and discomfort from the procedure.  Food intake: Start with clear liquids (like water) and advance to regular food, as tolerated.   Physical activities: Keep activities to a minimum for the first 8 hours after the procedure.   Driving: If you have received any sedation, you are not allowed to drive for 24 hours after your procedure.  Blood thinner: Restart your blood thinner 6 hours after your procedure. (Only for those taking blood thinners)  Insulin: As soon as you can eat, you may resume your normal dosing schedule. (Only for those taking insulin)  Infection prevention: Keep procedure site clean and dry.  Post-procedure Pain Diary: Extremely important that this be done correctly and accurately. Recorded information will be used to determine the next step in treatment.  Pain evaluated is that of treated area only. Do not include pain from an untreated area.  Complete every hour, on the hour, for the initial 8 hours. Set an alarm to help you do this part accurately.  Do not go to sleep and have it completed later. It will not be accurate.  Follow-up appointment: Keep your follow-up appointment after the procedure. Usually 2 weeks for most procedures. (6 weeks in the case of radiofrequency.) Bring you pain diary.  Expect:  From numbing medicine (AKA: Local Anesthetics): Numbness or decrease in pain.  Onset: Full effect within 15 minutes of injected.  Duration: It will depend  on the type of local anesthetic used. On the average, 1 to 8 hours.   From steroids: Decrease in swelling or inflammation. Once inflammation is improved, relief of the pain will follow.  Onset of benefits: Depends on the amount of swelling present. The more swelling, the longer it will take for the benefits to be seen. In some cases, up to 10 days.  Duration:  Steroids will stay in the system x 2 weeks. Duration of benefits will depend on multiple posibilities including persistent irritating factors.  From procedure: Some discomfort is to be expected once the numbing medicine wears off. This should be minimal if ice and heat are applied as instructed. Call if:  You experience numbness and weakness that gets worse with time, as opposed to wearing off.  New onset bowel or bladder incontinence. (Spinal procedures only)  Emergency Numbers:  [facility] business hours (Monday - Thursday, 8:00 AM - 4:00 PM) (Friday, 9:00 AM - 12:00 Noon): [phone]  After hours: [phone] ____________________________________________________________________________________________

## 2017-12-11 NOTE — Progress Notes (Addendum)
Patient's Name: Karen Edwards  MRN: [id number]  Referring Provider: Pleas Koch, NP  DOB: Jan 13, 1966  PCP: Pleas Koch, NP  DOS: 12/11/2017  Note by: Gaspar Cola, MD  Service setting: Ambulatory outpatient  Specialty: Interventional Pain Management  Patient type: Established  Location: [facility] (AMB) Pain Management Facility  Visit type: Interventional Procedure   Primary Reason for Visit: Interventional Pain Management Treatment. CC: Back Pain (back)  Procedure:       Anesthesia, Analgesia, Anxiolysis:  Procedure #1: Type: Medial Branch Facet Block #2 Primary Purpose: Diagnostic Region: Lumbar Level: L2, L3, L4, L5, & S1 Medial Branch Level(s) Target Area: For Lumbar Facet blocks, the target is the groove formed by the junction of the transverse process and superior articular process. For the L5 dorsal ramus, the target is the notch between superior articular process and sacral ala. For the S1 dorsal ramus, the target is the superior and lateral edge of the posterior S1 Sacral foramen. Approach: Posterior, paramedial, percutaneous approach. Laterality: Bilateral Position: Prone  Procedure #2: Type: Sacroiliac Joint Block #2  Primary Purpose: Diagnostic Region: Posterior Lumbosacral Level: PSIS (Posterior Superior Iliac Spine) Sacroiliac Joint Target Area: For upper sacroiliac joint block(s), the target is the superior and posterior margin of the sacroiliac joint. Approach: Ipsilateral approach. Laterality: Bilateral Position: Prone  Type: Local Anesthesia with Moderate (Conscious) Sedation Local Anesthetic: Lidocaine 1% Route: Intravenous (IV) IV Access: Secured Sedation: Meaningful verbal contact was maintained at all times during the procedure  Indication(s): Analgesia and Anxiety   Indications: 1. 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 Lumbar facet hypertrophy (Bilateral)   3. Chronic sacroiliac joint pain (Bilateral) (L>R)   4. Sacroiliac joint  dysfunction (Bilateral)   5. Chronic low back pain (Primary Area of Pain) (Bilateral) (R>L)    Pain Score: Pre-procedure: 3 /10 Post-procedure: 2 /10  Pre-op Assessment:  Karen Edwards is a 52 y.o. (year old), female patient, seen today for interventional treatment. She  has a past surgical history that includes Cesarean section; Ankle surgery (Right, 03/02/2014); Hemorrhoid surgery (N/A, 06/16/2015); Cervical spine surgery (02/21/2016); Cesarean section with bilateral tubal ligation (1990); Colonoscopy (04/19/2015); and Hysteroscopy w/D&C (12/24/2013). Karen Edwards has a current medication list which includes the following prescription(s): vitamin b-12, fluoxetine, hydrocodone-acetaminophen, linaclotide, methocarbamol, potassium, tizanidine, and trazodone, and the following Facility-Administered Medications: fentanyl, lactated ringers, and midazolam. Her primarily concern today is the Back Pain (back)  Initial Vital Signs:  Pulse Rate: 69 Temp: 99 F (37.2 C) Resp: 18 BP: 110/74 SpO2: 99 %  BMI: Estimated body mass index is 26.45 kg/m as calculated from the following:   Height as of this encounter: 5\' 1"  (1.549 m).   Weight as of this encounter: 140 lb (63.5 kg).  Risk Assessment: Allergies: Reviewed. She is allergic to no known allergies.  Allergy Precautions: None required Coagulopathies: Reviewed. None identified.  Blood-thinner therapy: None at this time Active Infection(s): Reviewed. None identified. Karen Edwards is afebrile  Site Confirmation: Karen Edwards was asked to confirm the procedure and laterality before marking the site Procedure checklist: Completed Consent: Before the procedure and under the influence of no sedative(s), amnesic(s), or anxiolytics, the patient was informed of the treatment options, risks and possible complications. To fulfill our ethical and legal obligations, as recommended by the American Medical Association's Code of Ethics, I have informed the  patient of my clinical impression; the nature and purpose of the treatment or procedure; the risks, benefits, and possible complications of the intervention; the alternatives,  including doing nothing; the risk(s) and benefit(s) of the alternative treatment(s) or procedure(s); and the risk(s) and benefit(s) of doing nothing. The patient was provided information about the general risks and possible complications associated with the procedure. These may include, but are not limited to: failure to achieve desired goals, infection, bleeding, organ or nerve damage, allergic reactions, paralysis, and death. In addition, the patient was informed of those risks and complications associated to Spine-related procedures, such as failure to decrease pain; infection (i.e.: Meningitis, epidural or intraspinal abscess); bleeding (i.e.: epidural hematoma, subarachnoid hemorrhage, or any other type of intraspinal or peri-dural bleeding); organ or nerve damage (i.e.: Any type of peripheral nerve, nerve root, or spinal cord injury) with subsequent damage to sensory, motor, and/or autonomic systems, resulting in permanent pain, numbness, and/or weakness of one or several areas of the body; allergic reactions; (i.e.: anaphylactic reaction); and/or death. Furthermore, the patient was informed of those risks and complications associated with the medications. These include, but are not limited to: allergic reactions (i.e.: anaphylactic or anaphylactoid reaction(s)); adrenal axis suppression; blood sugar elevation that in diabetics may result in ketoacidosis or comma; water retention that in patients with history of congestive heart failure may result in shortness of breath, pulmonary edema, and decompensation with resultant heart failure; weight gain; swelling or edema; medication-induced neural toxicity; particulate matter embolism and blood vessel occlusion with resultant organ, and/or nervous system infarction; and/or aseptic necrosis  of one or more joints. Finally, the patient was informed that Medicine is not an exact science; therefore, there is also the possibility of unforeseen or unpredictable risks and/or possible complications that may result in a catastrophic outcome. The patient indicated having understood very clearly. We have given the patient no guarantees and we have made no promises. Enough time was given to the patient to ask questions, all of which were answered to the patient's satisfaction. Karen Edwards has indicated that she wanted to continue with the procedure. Attestation: I, the ordering provider, attest that I have discussed with the patient the benefits, risks, side-effects, alternatives, likelihood of achieving goals, and potential problems during recovery for the procedure that I have provided informed consent. Date  Time: 12/11/2017  1:09 PM  Pre-Procedure Preparation:  Monitoring: As per clinic protocol. Respiration, ETCO2, SpO2, BP, heart rate and rhythm monitor placed and checked for adequate function Safety Precautions: Patient was assessed for positional comfort and pressure points before starting the procedure. Time-out: I initiated and conducted the "Time-out" before starting the procedure, as per protocol. The patient was asked to participate by confirming the accuracy of the "Time Out" information. Verification of the correct person, site, and procedure were performed and confirmed by me, the nursing staff, and the patient. "Time-out" conducted as per Joint Commission's Universal Protocol (UP.01.01.01). "Time-out" Date & Time: 12/11/2017; 1421 hrs.  Description of Procedure #1 Process:   Time-out: "Time-out" completed before starting procedure, as per protocol. Area Prepped: Entire Posterior Lumbosacral Region Prepping solution: ChloraPrep (2% chlorhexidine gluconate and 70% isopropyl alcohol) Safety Precautions: Aspiration looking for blood return was conducted prior to all injections. At no  point did we inject any substances, as a needle was being advanced. No attempts were made at seeking any paresthesias. Safe injection practices and needle disposal techniques used. Medications properly checked for expiration dates. SDV (single dose vial) medications used.  Description of the Procedure: Protocol guidelines were followed. The patient was placed in position over the fluoroscopy table. The target area was identified and the area prepped in the  usual manner. Skin desensitized using vapocoolant spray. Skin & deeper tissues infiltrated with local anesthetic. Appropriate amount of time allowed to pass for local anesthetics to take effect. The procedure needle was introduced through the skin, ipsilateral to the reported pain, and advanced to the target area. Employing the "Medial Branch Technique", the needles were advanced to the angle made by the superior and medial portion of the transverse process, and the lateral and inferior portion of the superior articulating process of the targeted vertebral bodies. This area is known as "Burton's Eye" or the "Eye of the [place] Dog". A procedure needle was introduced through the skin, and this time advanced to the angle made by the superior and medial border of the sacral ala, and the lateral border of the S1 vertebral body. This last needle was later repositioned at the superior and lateral border of the posterior S1 foramen. Negative aspiration confirmed. Solution injected in intermittent fashion, asking for systemic symptoms every 0.5cc of injectate. The needles were then removed and the area cleansed, making sure to leave some of the prepping solution back to take advantage of its long term bactericidal properties. Start Time: 1421 hrs. Materials:  Needle(s) Type: Regular needle Gauge: 22G Length: 3.5-in Medication(s): Please see orders for medications and dosing details.  Description of Procedure # 2 Process:   Area Prepped: Entire Posterior  Lumbosacral Region Prepping solution: ChloraPrep (2% chlorhexidine gluconate and 70% isopropyl alcohol) Safety Precautions: Aspiration looking for blood return was conducted prior to all injections. At no point did we inject any substances, as a needle was being advanced. No attempts were made at seeking any paresthesias. Safe injection practices and needle disposal techniques used. Medications properly checked for expiration dates. SDV (single dose vial) medications used. Description of the Procedure: Protocol guidelines were followed. The patient was placed in position over the fluoroscopy table. The target area was identified and the area prepped in the usual manner. Skin desensitized using vapocoolant spray. Skin & deeper tissues infiltrated with local anesthetic. Appropriate amount of time allowed to pass for local anesthetics to take effect. The procedure needle was advanced under fluoroscopic guidance into the sacroiliac joint until a firm endpoint was obtained. Proper needle placement secured. Negative aspiration confirmed. Solution injected in intermittent fashion, asking for systemic symptoms every 0.5cc of injectate. The needles were then removed and the area cleansed, making sure to leave some of the prepping solution back to take advantage of its long term bactericidal properties. Vitals:   12/11/17 1436 12/11/17 1441 12/11/17 1450 12/11/17 1500  BP: 128/71 133/65 138/68 138/79  Pulse:      Resp: (!) 9 10 12  (!) 22  Temp:   (!) 97.3 F (36.3 C)   SpO2: 100% 100% 98% 100%  Weight:      Height:        End Time: 1437 hrs. Materials:  Needle(s) Type: Regular needle Gauge: 22G Length: 3.5-in Medication(s): Please see orders for medications and dosing details.  Imaging Guidance (Spinal):  Type of Imaging Technique: Fluoroscopy Guidance (Spinal) Indication(s): Assistance in needle guidance and placement for procedures requiring needle placement in or near specific anatomical locations  not easily accessible without such assistance. Exposure Time: Please see nurses notes. Contrast: None used. Fluoroscopic Guidance: I was personally present during the use of fluoroscopy. "Tunnel Vision Technique" used to obtain the best possible view of the target area. Parallax error corrected before commencing the procedure. "Direction-depth-direction" technique used to introduce the needle under continuous pulsed fluoroscopy. Once target was  reached, antero-posterior, oblique, and lateral fluoroscopic projection used confirm needle placement in all planes. Images permanently stored in EMR. Interpretation: No contrast injected. I personally interpreted the imaging intraoperatively. Adequate needle placement confirmed in multiple planes. Permanent images saved into the patient's record.  Antibiotic Prophylaxis:   Anti-infectives (From admission, onward)   None     Indication(s): None identified  Post-operative Assessment:  Post-procedure Vital Signs:  Pulse Rate: 69 Temp: (!) 97.3 F (36.3 C) Resp: (!) 22 BP: 138/79 SpO2: 100 %  EBL: None  Complications: No immediate post-treatment complications observed by team, or reported by patient.  Note: The patient tolerated the entire procedure well. A repeat set of vitals were taken after the procedure and the patient was kept under observation following institutional policy, for this type of procedure. Post-procedural neurological assessment was performed, showing return to baseline, prior to discharge. The patient was provided with post-procedure discharge instructions, including a section on how to identify potential problems. Should any problems arise concerning this procedure, the patient was given instructions to immediately contact us, at any time, without hesitation. In any case, we plan to contact the patient by telephone for a follow-up status report regarding this interventional procedure.  Comments:  No additional relevant  information.  Plan of Care   Possible POC:  If the patient again gets more than 50% relief of the pain for the duration of the local anesthetic, but no long-term benefit, then we will plan on doing a lumbar facet + SI joint RF under fluoroscopic guidance and IV sedation, starting with her worst side.   Imaging Orders     DG C-Arm 1-60 Min-No Report  Procedure Orders     LUMBAR FACET(MEDIAL BRANCH NERVE BLOCK) MBNB     SACROILIAC JOINT INJECTION  Medications ordered for procedure: Meds ordered this encounter  Medications  . midazolam (VERSED) 5 MG/5ML injection 1-2 mg    Make sure Flumazenil is available in the pyxis when using this medication. If oversedation occurs, administer 0.2 mg IV over 15 sec. If after 45 sec no response, administer 0.2 mg again over 1 min; may repeat at 1 min intervals; not to exceed 4 doses (1 mg)  . fentaNYL (SUBLIMAZE) injection 25-50 mcg    Make sure Narcan is available in the pyxis when using this medication. In the event of respiratory depression (RR< 8/min): Titrate NARCAN (naloxone) in increments of 0.1 to 0.2 mg IV at 2-3 minute intervals, until desired degree of reversal.  . lactated ringers infusion 1,000 mL  . lidocaine (XYLOCAINE) 2 % (with pres) injection 200 mg  . ropivacaine (PF) 2 mg/mL (0.2%) (NAROPIN) injection 9 mL  . triamcinolone acetonide (KENALOG-40) injection 40 mg  . ropivacaine (PF) 2 mg/mL (0.2%) (NAROPIN) injection 9 mL  . triamcinolone acetonide (KENALOG-40) injection 40 mg  . ropivacaine (PF) 2 mg/mL (0.2%) (NAROPIN) injection 9 mL  . methylPREDNISolone acetate (DEPO-MEDROL) injection 80 mg   Medications administered: We administered midazolam, fentaNYL, lactated ringers, lidocaine, ropivacaine (PF) 2 mg/mL (0.2%), triamcinolone acetonide, ropivacaine (PF) 2 mg/mL (0.2%), triamcinolone acetonide, ropivacaine (PF) 2 mg/mL (0.2%), and methylPREDNISolone acetate.  See the medical record for exact dosing, route, and time of  administration.  New Prescriptions   No medications on file   Disposition: Discharge home  Discharge Date & Time: 12/11/2017; 1515 hrs.   Physician-requested Follow-up: Return for Med-Mgmt + Post-procedure eval, w/ Dionisio David, NP.  Future Appointments  Date Time Provider [place]  12/16/2017  3:45 PM Pleas Koch,  NP [facility]  12/30/2017 11:30 AM Vevelyn Francois, NP [facility] None   Primary Care Physician: Pleas Koch, NP Location: [facility] Note by: Gaspar Cola, MD Date: 12/11/2017; Time: 3:04 PM  Disclaimer:  Medicine is not an Chief Strategy Officer. The only guarantee in medicine is that nothing is guaranteed. It is important to note that the decision to proceed with this intervention was based on the information collected from the patient. The Data and conclusions were drawn from the patient's questionnaire, the interview, and the physical examination. Because the information was provided in large part by the patient, it cannot be guaranteed that it has not been purposely or unconsciously manipulated. Every effort has been made to obtain as much relevant data as possible for this evaluation. It is important to note that the conclusions that lead to this procedure are derived in large part from the available data. Always take into account that the treatment will also be dependent on availability of resources and existing treatment guidelines, considered by other Pain Management Practitioners as being common knowledge and practice, at the time of the intervention. For Medico-Legal purposes, it is also important to point out that variation in procedural techniques and pharmacological choices are the acceptable norm. The indications, contraindications, technique, and results of the above procedure should only be interpreted and judged by a Board-Certified Interventional Pain Specialist with extensive familiarity and expertise in the same  exact procedure and technique.

## 2017-12-11 NOTE — Progress Notes (Signed)
Safety precautions to be maintained throughout the outpatient stay will include: orient to surroundings, keep bed in low position, maintain call bell within reach at all times, provide assistance with transfer out of bed and ambulation.  

## 2017-12-12 ENCOUNTER — Other Ambulatory Visit: Payer: Self-pay | Admitting: Primary Care

## 2017-12-12 ENCOUNTER — Telehealth: Payer: Self-pay

## 2017-12-12 DIAGNOSIS — F32A Depression, unspecified: Secondary | ICD-10-CM

## 2017-12-12 DIAGNOSIS — F329 Major depressive disorder, single episode, unspecified: Secondary | ICD-10-CM

## 2017-12-12 DIAGNOSIS — G4700 Insomnia, unspecified: Secondary | ICD-10-CM

## 2017-12-12 NOTE — Telephone Encounter (Signed)
Post procedure phone call. Patient states she is doing good.  

## 2017-12-16 ENCOUNTER — Encounter: Payer: Self-pay | Admitting: Primary Care

## 2017-12-16 ENCOUNTER — Ambulatory Visit: Payer: PPO | Admitting: Primary Care

## 2017-12-16 DIAGNOSIS — K5909 Other constipation: Secondary | ICD-10-CM

## 2017-12-16 DIAGNOSIS — F329 Major depressive disorder, single episode, unspecified: Secondary | ICD-10-CM | POA: Diagnosis not present

## 2017-12-16 DIAGNOSIS — G4700 Insomnia, unspecified: Secondary | ICD-10-CM

## 2017-12-16 DIAGNOSIS — M47816 Spondylosis without myelopathy or radiculopathy, lumbar region: Secondary | ICD-10-CM

## 2017-12-16 DIAGNOSIS — F32A Depression, unspecified: Secondary | ICD-10-CM

## 2017-12-16 MED ORDER — TRAZODONE HCL 100 MG PO TABS: 300.0000 mg | ORAL_TABLET | Freq: Every day | ORAL | 3 refills | Status: DC

## 2017-12-16 MED ORDER — FLUOXETINE HCL 20 MG PO CAPS: 60.0000 mg | ORAL_CAPSULE | Freq: Every day | ORAL | 3 refills | Status: DC

## 2017-12-16 MED ORDER — LINACLOTIDE 145 MCG PO CAPS: 145.0000 ug | ORAL_CAPSULE | Freq: Every day | ORAL | 0 refills | Status: DC

## 2017-12-16 NOTE — Assessment & Plan Note (Signed)
Overall does well on trazodone, refills sent to pharmacy today. Denies SI/HI.

## 2017-12-16 NOTE — Assessment & Plan Note (Signed)
Does well on Linzess, she'd like refills despite the expense. Refill sent to pharmacy. Recommended she look online for a coupon card or discount savings card through the [employer].

## 2017-12-16 NOTE — Assessment & Plan Note (Signed)
Overdue for follow up despite numerous reminders, also has cancelled appointments or no show visits. Has been off meds for one week, refills sent to pharmacy today. Denies SI/HI.

## 2017-12-16 NOTE — Assessment & Plan Note (Signed)
Following with pain management, feels well managed.  Continue current regimen. 

## 2017-12-16 NOTE — Progress Notes (Signed)
Subjective:    Patient ID: Karen Edwards, female    DOB: November 03, 1965, 52 y.o.   MRN: [id number]  HPI  Karen Edwards is a 52 year old female who presents today for follow up.  1) Chronic Back Pain: Currently managed on hydrocodone 5-325 mg tablets, tizanidine 4 mg, methocarbamol 500 mg. Currently following with pain management and is doing well with recent back injections. She's very happy with her current treatment plan.   2) Chronic Constipation: Secondary to chronic narcotic use. Currently managed on Linzess 145 mcg for which is effective but also expensive. She's tried Amitiza in the past which was also too costly. She's tried numerous OTC treatments without improvement. She'd like refills for linzess.   3) Depression and Insomnia: Currently managed on fluoxetine 60 mg once daily and trazodone 300 mg HS. Overall does very well on this regimen but she's been out of her medication for the past one week and is feeling irritable and "jumpy on the inside". She is overdue for follow up and is here today for refills. She denies SI/HI.  Review of Systems  Respiratory: Negative for shortness of breath.   Cardiovascular: Negative for chest pain.  Gastrointestinal:       Chronic constipation  Musculoskeletal:       Chronic neck and back pain  Neurological: Negative for headaches.  Psychiatric/Behavioral: Positive for sleep disturbance. Negative for suicidal ideas. The patient is nervous/anxious.        Past Medical History:  Diagnosis Date  . Anxiety   . Chronic back pain   . Degenerative joint disease   . Depression   . Hypotension   . IBS (irritable bowel syndrome) 05/2015  . MVP (mitral valve prolapse)      Social History   Socioeconomic History  . Marital status: Married    Spouse name: Not on file  . Number of children: 2  . Years of education: Not on file  . Highest education level: Not on file  Social Needs  . Financial resource strain: Not hard at all  . Food  insecurity - worry: Patient refused  . Food insecurity - inability: Patient refused  . Transportation needs - medical: Patient refused  . Transportation needs - non-medical: Patient refused  Occupational History  . Occupation: disabled  Tobacco Use  . Smoking status: Current Every Day Smoker    Packs/day: 0.10    Years: 37.00    Pack years: 3.70    Types: Cigarettes  . Smokeless tobacco: Never Used  Substance and Sexual Activity  . Alcohol use: Yes    Alcohol/week: 0.6 oz    Types: 1 Cans of beer per week    Comment: drinks an occasional beer  . Drug use: No  . Sexual activity: Yes    Birth control/protection: None, Surgical    Comment: tubal ligation  Other Topics Concern  . Not on file  Social History Narrative   Married.   2 children. One boy and one girl.   On disability.   Playing with her dogs, gardening.    Past Surgical History:  Procedure Laterality Date  . ANKLE SURGERY Right 03/02/2014   repair of tendon and sural nerve right ankle  . CERVICAL SPINE SURGERY  02/21/2016   arthrodesis of anterior cervical spine (fusion of C5-6,C6-7 and insertion of bone allograft  . CESAREAN SECTION    . CESAREAN SECTION WITH BILATERAL TUBAL LIGATION  1990  . COLONOSCOPY  04/19/2015  . HEMORRHOID SURGERY N/A 06/16/2015  Procedure: HEMORRHOIDECTOMY;  Surgeon: Leonie Green, MD;  Location: [facility] ORS;  Service: General;  Laterality: N/A;  . HYSTEROSCOPY W/D&C  12/24/2013    Family History  Problem Relation Age of Onset  . Arthritis Mother   . Depression Mother   . Hyperlipidemia Mother   . Hypertension Mother   . Kidney disease Mother   . Vision loss Mother   . Hypothyroidism Mother   . Hearing loss Father   . Hyperlipidemia Father   . Hypertension Father   . Diabetes Mellitus I Maternal Grandmother   . Stroke Maternal Grandmother   . Congestive Heart Failure Maternal Grandfather   . Heart attack Maternal Grandfather   . Prostate cancer Paternal Grandfather   .  Thyroid disease Maternal Aunt   . Diabetes Maternal Aunt   . Gout Maternal Aunt     Allergies  Allergen Reactions  . No Known Allergies     Current Outpatient Medications on File Prior to Visit  Medication Sig Dispense Refill  . Cyanocobalamin (B-12 PO) Take 2,500 mcg by mouth every other day.    Marland Kitchen HYDROcodone-acetaminophen (NORCO/VICODIN) 5-325 MG tablet Take 1 tablet by mouth 2 (two) times daily. 60 tablet 0  . methocarbamol (ROBAXIN) 500 MG tablet Take 1 tablet (500 mg total) by mouth every 8 (eight) hours as needed for muscle spasms. 90 tablet 0  . Potassium 99 MG TABS Take 1 tablet by mouth daily.    . Cyanocobalamin (VITAMIN B-12) 5000 MCG TBDP Take 2,500 mcg by mouth daily.      No current facility-administered medications on file prior to visit.     BP 118/76   Pulse 73   Temp 98.6 F (37 C) (Oral)   Ht 5' (1.524 m)   Wt 145 lb 12.8 oz (66.1 kg)   LMP 05/05/2017 (Exact Date)   SpO2 97%   BMI 28.47 kg/m    Objective:   Physical Exam  Constitutional: She appears well-nourished.  Neck: Neck supple.  Cardiovascular: Normal rate and regular rhythm.  Pulmonary/Chest: Effort normal and breath sounds normal.  Skin: Skin is warm and dry.  Psychiatric: She has a normal mood and affect.          Assessment & Plan:

## 2017-12-16 NOTE — Patient Instructions (Signed)
Take a look at the savings card online for [place].  I sent refills for fluoxetine 60 mg and trazodone 300 mg at bedtime.  It was a pleasure to see you today!

## 2017-12-26 ENCOUNTER — Ambulatory Visit: Payer: PPO | Admitting: Primary Care

## 2017-12-30 ENCOUNTER — Encounter: Payer: PPO | Admitting: Nurse Practitioner

## 2017-12-31 ENCOUNTER — Ambulatory Visit: Payer: PPO | Admitting: Pain Medicine

## 2017-12-31 ENCOUNTER — Encounter: Payer: PPO | Admitting: Nurse Practitioner

## 2017-12-31 ENCOUNTER — Ambulatory Visit: Payer: PPO | Admitting: Nurse Practitioner

## 2018-01-01 ENCOUNTER — Ambulatory Visit
Admission: RE | Admit: 2018-01-01 | Discharge: 2018-01-01 | Disposition: A | Discharge status: Home / Self Care | Payer: PPO | Source: Ambulatory Visit | Attending: Nurse Practitioner | Admitting: Nurse Practitioner

## 2018-01-01 ENCOUNTER — Encounter: Payer: Self-pay | Admitting: Nurse Practitioner

## 2018-01-01 ENCOUNTER — Other Ambulatory Visit: Payer: Self-pay

## 2018-01-01 ENCOUNTER — Ambulatory Visit: Payer: PPO | Admitting: Primary Care

## 2018-01-01 ENCOUNTER — Ambulatory Visit: Payer: PPO | Attending: Nurse Practitioner | Admitting: Nurse Practitioner

## 2018-01-01 VITALS — BP 108/71 | HR 72 | Temp 98.2°F | Resp 16 | Ht 61.0 in | Wt 140.0 lb

## 2018-01-01 DIAGNOSIS — G8929 Other chronic pain: Secondary | ICD-10-CM

## 2018-01-01 DIAGNOSIS — M542 Cervicalgia: Secondary | ICD-10-CM | POA: Diagnosis not present

## 2018-01-01 DIAGNOSIS — Z9889 Other specified postprocedural states: Secondary | ICD-10-CM | POA: Diagnosis not present

## 2018-01-01 DIAGNOSIS — F329 Major depressive disorder, single episode, unspecified: Secondary | ICD-10-CM | POA: Insufficient documentation

## 2018-01-01 DIAGNOSIS — M25511 Pain in right shoulder: Secondary | ICD-10-CM | POA: Diagnosis not present

## 2018-01-01 DIAGNOSIS — M5412 Radiculopathy, cervical region: Secondary | ICD-10-CM

## 2018-01-01 DIAGNOSIS — M5137 Other intervertebral disc degeneration, lumbosacral region: Secondary | ICD-10-CM | POA: Insufficient documentation

## 2018-01-01 DIAGNOSIS — M5481 Occipital neuralgia: Secondary | ICD-10-CM

## 2018-01-01 DIAGNOSIS — M5010 Cervical disc disorder with radiculopathy, unspecified cervical region: Secondary | ICD-10-CM | POA: Insufficient documentation

## 2018-01-01 DIAGNOSIS — M533 Sacrococcygeal disorders, not elsewhere classified: Secondary | ICD-10-CM

## 2018-01-01 DIAGNOSIS — M25512 Pain in left shoulder: Secondary | ICD-10-CM | POA: Insufficient documentation

## 2018-01-01 DIAGNOSIS — E785 Hyperlipidemia, unspecified: Secondary | ICD-10-CM | POA: Diagnosis not present

## 2018-01-01 DIAGNOSIS — Z5181 Encounter for therapeutic drug level monitoring: Secondary | ICD-10-CM | POA: Diagnosis not present

## 2018-01-01 DIAGNOSIS — M4726 Other spondylosis with radiculopathy, lumbar region: Secondary | ICD-10-CM | POA: Diagnosis not present

## 2018-01-01 DIAGNOSIS — F17210 Nicotine dependence, cigarettes, uncomplicated: Secondary | ICD-10-CM | POA: Diagnosis not present

## 2018-01-01 DIAGNOSIS — M5116 Intervertebral disc disorders with radiculopathy, lumbar region: Secondary | ICD-10-CM | POA: Insufficient documentation

## 2018-01-01 DIAGNOSIS — K219 Gastro-esophageal reflux disease without esophagitis: Secondary | ICD-10-CM | POA: Diagnosis not present

## 2018-01-01 DIAGNOSIS — M5136 Other intervertebral disc degeneration, lumbar region: Secondary | ICD-10-CM

## 2018-01-01 DIAGNOSIS — K625 Hemorrhage of anus and rectum: Secondary | ICD-10-CM | POA: Insufficient documentation

## 2018-01-01 DIAGNOSIS — M4802 Spinal stenosis, cervical region: Secondary | ICD-10-CM | POA: Diagnosis not present

## 2018-01-01 DIAGNOSIS — G894 Chronic pain syndrome: Secondary | ICD-10-CM | POA: Diagnosis not present

## 2018-01-01 DIAGNOSIS — Z0289 Encounter for other administrative examinations: Secondary | ICD-10-CM

## 2018-01-01 DIAGNOSIS — Z981 Arthrodesis status: Secondary | ICD-10-CM | POA: Insufficient documentation

## 2018-01-01 DIAGNOSIS — Z79899 Other long term (current) drug therapy: Secondary | ICD-10-CM | POA: Diagnosis not present

## 2018-01-01 DIAGNOSIS — M47816 Spondylosis without myelopathy or radiculopathy, lumbar region: Secondary | ICD-10-CM

## 2018-01-01 DIAGNOSIS — M545 Low back pain: Secondary | ICD-10-CM | POA: Diagnosis present

## 2018-01-01 MED ORDER — METHOCARBAMOL 500 MG PO TABS: 500.0000 mg | ORAL_TABLET | Freq: Three times a day (TID) | ORAL | 0 refills | Status: DC | PRN

## 2018-01-01 MED ORDER — HYDROCODONE-ACETAMINOPHEN 5-325 MG PO TABS: 1.0000 | ORAL_TABLET | Freq: Two times a day (BID) | ORAL | 0 refills | Status: DC

## 2018-01-01 NOTE — Progress Notes (Signed)
Nursing Pain Medication Assessment:  Safety precautions to be maintained throughout the outpatient stay will include: orient to surroundings, keep bed in low position, maintain call bell within reach at all times, provide assistance with transfer out of bed and ambulation.  Medication Inspection Compliance: Pill count conducted under aseptic conditions, in front of the patient. Neither the pills nor the bottle was removed from the patient's sight at any time. Once count was completed pills were immediately returned to the patient in their original bottle.  Medication: Hydrocodone/APAP Pill/Patch Count: 8 of 60 pills remain Pill/Patch Appearance: Markings consistent with prescribed medication Bottle Appearance: Standard pharmacy container. Clearly labeled. Filled Date: 02 / 15 / 2019 Last Medication intake:  Yesterday

## 2018-01-01 NOTE — Progress Notes (Signed)
Patient's Name: Karen Edwards  MRN: [id number]  Referring Provider: Pleas Koch, NP  DOB: [birth year]  PCP: Pleas Koch, NP  DOS: 01/01/2018  Note by: Vevelyn Francois NP  Service setting: Ambulatory outpatient  Specialty: Interventional Pain Management  Location: [facility] (AMB) Pain Management Facility    Patient type: Established    Primary Reason(s) for Visit: Encounter for prescription drug management & post-procedure evaluation of chronic illness with mild to moderate exacerbation(Level of risk: moderate) CC: Back Pain (low)  HPI  Karen Edwards is a 52 y.o. year old, female patient, who comes today for a post-procedure evaluation and medication management. She has Chronic low back pain (Primary Area of Pain) (Bilateral) (R>L); Menorrhagia; Fatigue; DDD (degenerative disc disease), lumbar; DDD (degenerative disc disease), cervical; Chronic occipital neuralgia (Fifth Area of Pain) (Bilateral) (R>L); Lumbar facet syndrome (Bilateral) (L>R); DDD (degenerative disc disease), lumbosacral; Lumbar radicular pain (Right) (L4); Sacroiliac joint dysfunction (Bilateral); Depression; Rectal bleeding; Insomnia; Chronic constipation; Pharmacologic therapy; Hyperlipidemia, unspecified; Chronic neck pain (Fourth Area of Pain) (Bilateral) (R>L); GERD (gastroesophageal reflux disease); Numbness and tingling of upper extremity (Right); Chronic upper extremity pain (Bilateral) (R>L); Chronic sacroiliac joint pain (Bilateral) (L>R); Lumbar facet hypertrophy (Bilateral); L1-2 disc extrusion (Right); Cervical foraminal stenosis (C5-6) (Bilateral); Long term prescription opiate use; Opiate use; Disorder of skeletal system; Problems influencing health status; Chronic pain syndrome; Chronic cervical radicular pain (Right: C6/C7) (Left: C5/T1); Chronic lumbar radicular pain; Chronic hip pain (Tertiary Area of Pain) (Bilateral) (R>L); Chronic lower extremity pain (Secondary Area of Pain) (Bilateral) (R>L); Chronic  shoulder pain (Bilateral) (L>R); Chronic musculoskeletal pain; and Lumbar spondylosis on their problem list. Her primarily concern today is the Back Pain (low)  Pain Assessment: Location: Lower Back Radiating: raditaes down both legs to knee on right in the side, left leg goes down to buttock Onset: More than a month ago  Quality: Aching, Constant, Burning, Stabbing Severity: 3 /10 (self-reported pain score)  Note: Reported level is compatible with observation.                          Modifying factors: sitting, lying  Karen Edwards was last seen on 12/08/2017 for a procedure. During today's appointment we reviewed Karen Edwards's post-procedure results, as well as her outpatient medication regimen. She is concern about having the lumbar facet RFA. She would like to know what other options that she has for pain management. She admits that she has been told that her back is not bad enough for any surgery. She admits that her back pain is worse than the the leg pain. She admits that she is also having neck pain and would like to have this evaluated. She admits that it has been a while since this was evalutatd and she was instructed that improving the back pain was the first goal.   Further details on both, my assessment(s), as well as the proposed treatment plan, please see below.  Controlled Substance Pharmacotherapy Assessment REMS (Risk Evaluation and Mitigation Strategy)  Analgesic:Hydrocodone/APAP 5/325 1 tablet p.o. twice daily MME/day:94m/day   TDewayne Shorter RN  01/01/2018 12:07 PM  Sign at close encounter Nursing Pain Medication Assessment:  Safety precautions to be maintained throughout the outpatient stay will include: orient to surroundings, keep bed in low position, maintain call bell within reach at all times, provide assistance with transfer out of bed and ambulation.  Medication Inspection Compliance: Pill count conducted under aseptic conditions, in front of the patient.  Neither  the pills nor the bottle was removed from the patient's sight at any time. Once count was completed pills were immediately returned to the patient in their original bottle.  Medication: Hydrocodone/APAP Pill/Patch Count: 8 of 60 pills remain Pill/Patch Appearance: Markings consistent with prescribed medication Bottle Appearance: Standard pharmacy container. Clearly labeled. Filled Date: 02 / 15 / 2019 Last Medication intake:  Yesterday   Pharmacokinetics: Liberation and absorption (onset of action): WNL Distribution (time to peak effect): WNL Metabolism and excretion (duration of action): WNL         Pharmacodynamics: Desired effects: Analgesia: Karen Edwards reports >50% benefit. Functional ability: Patient reports that medication allows her to accomplish basic ADLs Clinically meaningful improvement in function (CMIF): Sustained CMIF goals met Perceived effectiveness: Described as relatively effective, allowing for increase in activities of daily living (ADL) Undesirable effects: Side-effects or Adverse reactions: None reported Monitoring: [place] PMP: Online review of the past 52-monthperiod conducted. Compliant with practice rules and regulations Last UDS on record: Summary  Date Value Ref Range Status  09/08/2017 FINAL  Final    Comment:    ==================================================================== [facility] COMP DRUG ANALYSIS,UR ==================================================================== Test                             Result       Flag       Units Drug Present   Zolpidem Acid                  PRESENT    Zolpidem acid is an expected metabolite of zolpidem.   Fluoxetine                     PRESENT   Norfluoxetine                  PRESENT    Norfluoxetine is an expected metabolite of fluoxetine.   Trazodone                      PRESENT   1,3 chlorophenyl piperazine    PRESENT    1,3-chlorophenyl piperazine is an expected metabolite of     trazodone. ==================================================================== Test                      Result    Flag   Units      Ref Range   Creatinine              56               mg/dL      >=20 ==================================================================== Declared Medications:  Medication list was not provided. ==================================================================== For clinical consultation, please call ([phone] ====================================================================    UDS interpretation: Compliant          Medication Assessment Form: Reviewed. Patient indicates being compliant with therapy Treatment compliance: Compliant Risk Assessment Profile: Aberrant behavior: See prior evaluations. None observed or detected today Comorbid factors increasing risk of overdose: See prior notes. No additional risks detected today Risk of substance use disorder (SUD): Low Opioid Risk Tool - 12/08/17 1205      Family History of Substance Abuse   Alcohol  Negative    Illegal Drugs  Negative    Rx Drugs  Negative      Personal History of Substance Abuse   Alcohol  Negative    Illegal Drugs  Negative    Rx Drugs  Negative      Age   Age between 41-45 years   No      History of Preadolescent Sexual Abuse   History of Preadolescent Sexual Abuse  Negative or Female      Psychological Disease   Psychological Disease  Negative    Depression  Positive      Total Score   Opioid Risk Tool Scoring  1    Opioid Risk Interpretation  Low Risk      ORT Scoring interpretation table:  Score <3 = Low Risk for SUD  Score between 4-7 = Moderate Risk for SUD  Score >8 = High Risk for Opioid Abuse   Risk Mitigation Strategies:  Patient Counseling: Covered Patient-Prescriber Agreement (PPA): Present and active  Notification to other healthcare providers: Done  Pharmacologic Plan: No change in therapy, at this time.             Post-Procedure  Assessment  12/11/2017 Procedure: Bilateral lumbar facet bilateral sacroiliac joint nerve blocks Pre-procedure pain score:  3/10 Post-procedure pain score: 2/10         Influential Factors: BMI: 26.45 kg/m Intra-procedural challenges: None observed.         Assessment challenges: None detected.              Reported side-effects: None.        Post-procedural adverse reactions or complications: None reported         Sedation: Please see nurses note. When no sedatives are used, the analgesic levels obtained are directly associated to the effectiveness of the local anesthetics. However, when sedation is provided, the level of analgesia obtained during the initial 1 hour following the intervention, is believed to be the result of a combination of factors. These factors may include, but are not limited to: 1. The effectiveness of the local anesthetics used. 2. The effects of the analgesic(s) and/or anxiolytic(s) used. 3. The degree of discomfort experienced by the patient at the time of the procedure. 4. The patients ability and reliability in recalling and recording the events. 5. The presence and influence of possible secondary gains and/or psychosocial factors. Reported result: Relief experienced during the 1st hour after the procedure: 100 % (Ultra-Short Term Relief)            Interpretative annotation: Clinically appropriate result. Analgesia during this period is likely to be Local Anesthetic and/or IV Sedative (Analgesic/Anxiolytic) related.          Effects of local anesthetic: The analgesic effects attained during this period are directly associated to the localized infiltration of local anesthetics and therefore cary significant diagnostic value as to the etiological location, or anatomical origin, of the pain. Expected duration of relief is directly dependent on the pharmacodynamics of the local anesthetic used. Long-acting (4-6 hours) anesthetics used.  Reported result: Relief during the  next 4 to 6 hour after the procedure: 0 % (Short-Term Relief)            Interpretative annotation: Clinically appropriate result. Analgesia during this period is likely to be Local Anesthetic-related.          Long-term benefit: Defined as the period of time past the expected duration of local anesthetics (1 hour for short-acting and 4-6 hours for long-acting). With the possible exception of prolonged sympathetic blockade from the local anesthetics, benefits during this period are typically attributed to, or associated with, other factors such as analgesic sensory neuropraxia, antiinflammatory effects, or beneficial biochemical changes provided by agents other than  the local anesthetics.  Reported result: Extended relief following procedure: 0 % (Long-Term Relief)            Interpretative annotation: Clinically appropriate result. Good relief. No permanent benefit expected. Inflammation plays a part in the etiology to the pain.          Current benefits: Defined as reported results that persistent at this point in time.   Analgesia: <25 %            Function: Back to baseline ROM: Back to baseline Interpretative annotation: Recurrence of symptoms. No permanent benefit expected.             Interpretation: Results would suggestfurther evaluation and testing may be required.                  Plan:  Please see "Plan of Care" for details.                Laboratory Chemistry  Inflammation Markers (CRP: Acute Phase) (ESR: Chronic Phase) Lab Results  Component Value Date   CRP 1.4 09/08/2017   ESRSEDRATE 11 09/08/2017                         Rheumatology Markers No results found for: RF, ANA, Therisa Doyne, [facility]              Renal Function Markers Lab Results  Component Value Date   BUN 7 09/08/2017   CREATININE 0.84 09/08/2017   GFRAA 93 09/08/2017   GFRNONAA 81 09/08/2017                 Hepatic Function Markers Lab Results  Component Value Date   AST 18  09/08/2017   ALT 12 (L) 01/30/2017   ALBUMIN 4.7 09/08/2017   ALKPHOS 73 09/08/2017                 Electrolytes Lab Results  Component Value Date   NA 139 09/08/2017   K 4.4 09/08/2017   CL 99 09/08/2017   CALCIUM 9.8 09/08/2017   MG 2.1 09/08/2017                        Neuropathy Markers Lab Results  Component Value Date   VITAMINB12 >2000 (H) 09/08/2017                 Bone Pathology Markers Lab Results  Component Value Date   VD25OH 43.74 01/10/2016   25OHVITD1 51 09/08/2017   25OHVITD2 <1.0 09/08/2017   25OHVITD3 51 09/08/2017                         Coagulation Parameters Lab Results  Component Value Date   PLT 202.0 05/20/2017                 Cardiovascular Markers Lab Results  Component Value Date   TROPONINI <0.03 01/30/2017   HGB 13.5 05/20/2017   HCT 40.5 05/20/2017                 CA Markers No results found for: CEA, CA125, LABCA2               Note: Lab results reviewed.  Recent Diagnostic Imaging Results  DG C-Arm 1-60 Min-No Report Fluoroscopy was utilized by the requesting physician.  No radiographic  interpretation.   Complexity Note: Imaging results reviewed. Results shared with Ms. Tye Savoy,  using Layman's terms.                         Meds   Current Outpatient Medications:  .  Cyanocobalamin (B-12 PO), Take 2,500 mcg by mouth every other day., Disp: , Rfl:  .  FLUoxetine (PROZAC) 20 MG capsule, Take 3 capsules (60 mg total) by mouth daily., Disp: 270 capsule, Rfl: 3 .  [START ON 01/11/2018] HYDROcodone-acetaminophen (NORCO/VICODIN) 5-325 MG tablet, Take 1 tablet by mouth 2 (two) times daily., Disp: 60 tablet, Rfl: 0 .  linaclotide (LINZESS) 145 MCG CAPS capsule, Take 1 capsule (145 mcg total) by mouth daily before breakfast., Disp: 90 capsule, Rfl: 0 .  methocarbamol (ROBAXIN) 500 MG tablet, Take 1 tablet (500 mg total) by mouth every 8 (eight) hours as needed for muscle spasms., Disp: 90 tablet, Rfl: 0 .  Potassium 99 MG TABS,  Take 1 tablet by mouth daily., Disp: , Rfl:  .  traZODone (DESYREL) 100 MG tablet, Take 3 tablets (300 mg total) by mouth at bedtime., Disp: 270 tablet, Rfl: 3  ROS  Constitutional: Denies any fever or chills Gastrointestinal: No reported hemesis, hematochezia, vomiting, or acute GI distress Musculoskeletal: Denies any acute onset joint swelling, redness, loss of ROM, or weakness Neurological: No reported episodes of acute onset apraxia, aphasia, dysarthria, agnosia, amnesia, paralysis, loss of coordination, or loss of consciousness  Allergies  Ms. Stanbery is allergic to no known allergies.  [place]  Drug: Ms. Melikian  reports that she does not use drugs. Alcohol:  reports that she drinks about 0.6 oz of alcohol per week. Tobacco:  reports that she has been smoking cigarettes.  She has a 3.70 pack-year smoking history. she has never used smokeless tobacco. Medical:  has a past medical history of Anxiety, Chronic back pain, Degenerative joint disease, Depression, Hypotension, IBS (irritable bowel syndrome) (05/2015), and MVP (mitral valve prolapse). Surgical: Ms. Ramseyer  has a past surgical history that includes Cesarean section; Ankle surgery (Right, 03/02/2014); Hemorrhoid surgery (N/A, 06/16/2015); Cervical spine surgery (02/21/2016); Cesarean section with bilateral tubal ligation (1990); Colonoscopy (04/19/2015); and Hysteroscopy w/D&C (12/24/2013). Family: family history includes Arthritis in her mother; Congestive Heart Failure in her maternal grandfather; Depression in her mother; Diabetes in her maternal aunt; Diabetes Mellitus I in her maternal grandmother; Gout in her maternal aunt; Hearing loss in her father; Heart attack in her maternal grandfather; Hyperlipidemia in her father and mother; Hypertension in her father and mother; Hypothyroidism in her mother; Kidney disease in her mother; Prostate cancer in her paternal grandfather; Stroke in her maternal grandmother; Thyroid disease in her  maternal aunt; Vision loss in her mother.  Constitutional Exam  General appearance: Well nourished, well developed, and well hydrated. In no apparent acute distress Vitals:   01/01/18 1200  BP: 108/71  Pulse: 72  Resp: 16  Temp: 98.2 F (36.8 C)  SpO2: 98%  Weight: 140 lb (63.5 kg)  Height: _0  (1.549 m)   BMI Assessment: Estimated body mass index is 26.45 kg/m as calculated from the following:   Height as of this encounter: _1  (1.549 m).   Weight as of this encounter: 140 lb (63.5 kg).  BMI interpretation table: BMI level Category Range association with higher incidence of chronic pain  <18 kg/m2 Underweight   18.5-24.9 kg/m2 Ideal body weight   25-29.9 kg/m2 Overweight Increased incidence by 20%  30-34.9 kg/m2 Obese (Class I) Increased incidence by 68%  35-39.9 kg/m2 Severe obesity (Class II)  Increased incidence by 136%  >40 kg/m2 Extreme obesity (Class III) Increased incidence by 254%   BMI Readings from Last 4 Encounters:  01/01/18 26.45 kg/m  12/16/17 28.47 kg/m  12/11/17 26.45 kg/m  12/08/17 27.34 kg/m   Wt Readings from Last 4 Encounters:  01/01/18 140 lb (63.5 kg)  12/16/17 145 lb 12.8 oz (66.1 kg)  12/11/17 140 lb (63.5 kg)  12/08/17 140 lb (63.5 kg)  Psych/Mental status: Alert, oriented x 3 (person, place, & time)       Eyes: PERLA Respiratory: No evidence of acute respiratory distress  Cervical Spine Area Exam  Skin & Axial Inspection: No masses, redness, edema, swelling, or associated skin lesions Alignment: Symmetrical Functional ROM: Unrestricted ROM      Stability: No instability detected Muscle Tone/Strength: Functionally intact. No obvious neuro-muscular anomalies detected. Sensory (Neurological): Unimpaired Palpation: No palpable anomalies              Upper Extremity (UE) Exam    Side: Right upper extremity  Side: Left upper extremity  Skin & Extremity Inspection: Skin color, temperature, and hair growth are WNL. No peripheral edema  or cyanosis. No masses, redness, swelling, asymmetry, or associated skin lesions. No contractures.  Skin & Extremity Inspection: Skin color, temperature, and hair growth are WNL. No peripheral edema or cyanosis. No masses, redness, swelling, asymmetry, or associated skin lesions. No contractures.  Functional ROM: Unrestricted ROM          Functional ROM: Unrestricted ROM          Muscle Tone/Strength: Functionally intact. No obvious neuro-muscular anomalies detected.  Muscle Tone/Strength: Functionally intact. No obvious neuro-muscular anomalies detected.  Sensory (Neurological): Unimpaired          Sensory (Neurological): Unimpaired          Palpation: No palpable anomalies              Palpation: No palpable anomalies              Specialized Test(s): Deferred         Specialized Test(s): Deferred          Thoracic Spine Area Exam  Skin & Axial Inspection: No masses, redness, or swelling Alignment: Symmetrical Functional ROM: Unrestricted ROM Stability: No instability detected Muscle Tone/Strength: Functionally intact. No obvious neuro-muscular anomalies detected. Sensory (Neurological): Unimpaired Muscle strength & Tone: No palpable anomalies  Lumbar Spine Area Exam  Skin & Axial Inspection: No masses, redness, or swelling Alignment: Symmetrical Functional ROM: Unrestricted ROM      Stability: No instability detected Muscle Tone/Strength: Functionally intact. No obvious neuro-muscular anomalies detected. Sensory (Neurological): Unimpaired Palpation: No palpable anomalies       Provocative Tests: Lumbar Hyperextension and rotation test: evaluation deferred today       Lumbar Lateral bending test: evaluation deferred today       Patrick's Maneuver: evaluation deferred today                    Gait & Posture Assessment  Ambulation: Unassisted Gait: Relatively normal for age and body habitus Posture: WNL   Lower Extremity Exam    Side: Right lower extremity  Side: Left lower  extremity  Skin & Extremity Inspection: Skin color, temperature, and hair growth are WNL. No peripheral edema or cyanosis. No masses, redness, swelling, asymmetry, or associated skin lesions. No contractures.  Skin & Extremity Inspection: Skin color, temperature, and hair growth are WNL. No peripheral edema or cyanosis. No masses, redness, swelling, asymmetry,  or associated skin lesions. No contractures.  Functional ROM: Unrestricted ROM          Functional ROM: Unrestricted ROM          Muscle Tone/Strength: Functionally intact. No obvious neuro-muscular anomalies detected.  Muscle Tone/Strength: Functionally intact. No obvious neuro-muscular anomalies detected.  Sensory (Neurological): Unimpaired  Sensory (Neurological): Unimpaired  Palpation: No palpable anomalies  Palpation: No palpable anomalies   Assessment  Primary Diagnosis & Pertinent Problem List: The primary encounter diagnosis was Lumbar spondylosis. Diagnoses of Sacroiliac joint dysfunction (Bilateral), Chronic occipital neuralgia (Fifth Area of Pain) (Bilateral) (R>L), Chronic cervical radicular pain (Right: C6/C7) (Left: C5/T1), Other intervertebral disc degeneration, lumbar region, Cervicalgia, and Chronic pain syndrome were also pertinent to this visit.  Status Diagnosis  Controlled Controlled Controlled 1. Lumbar spondylosis   2. Sacroiliac joint dysfunction (Bilateral)   3. Chronic occipital neuralgia (Fifth Area of Pain) (Bilateral) (R>L)   4. Chronic cervical radicular pain (Right: C6/C7) (Left: C5/T1)   5. Other intervertebral disc degeneration, lumbar region   6. Cervicalgia   7. Chronic pain syndrome     Problems updated and reviewed during this visit: No problems updated. Plan of Care  Pharmacotherapy (Medications Ordered): Meds ordered this encounter  Medications  . methocarbamol (ROBAXIN) 500 MG tablet    Sig: Take 1 tablet (500 mg total) by mouth every 8 (eight) hours as needed for muscle spasms.    Dispense:   90 tablet    Refill:  0    Do not place this medication, or any other prescription from our practice, on "Automatic Refill". Patient may have prescription filled one day early if pharmacy is closed on scheduled refill date.    Order Specific Question:   Supervising Provider    Answer:   Milinda Pointer [phone]  . HYDROcodone-acetaminophen (NORCO/VICODIN) 5-325 MG tablet    Sig: Take 1 tablet by mouth 2 (two) times daily.    Dispense:  60 tablet    Refill:  0    Fill one day early if pharmacy is closed on scheduled refill date. Do not fill until: 01/11/2018 To last until: 02/10/2018    Order Specific Question:   Supervising Provider    Answer:   Milinda Pointer [phone]   New Prescriptions   No medications on file   Medications administered today: Masey L. Waidelich had no medications administered during this visit. Lab-work, procedure(s), and/or referral(s): Orders Placed This Encounter  Procedures  . MR LUMBAR SPINE WO CONTRAST  . DG Cervical Spine With Flex & Extend  . MR CERVICAL SPINE WO CONTRAST   Imaging and/or referral(s): MR LUMBAR SPINE WO CONTRAST MR CERVICAL SPINE WO CONTRAST  Interventional therapies: Planned, scheduled, and/or pending:   Not at this time. Images as above follow-up with Dr. Lowella Dandy for further evaluation and next step in treatment    Considering: Diagnostic bilateral lumbar facet block Possible bilateral lumbar facet RFA Diagnostic bilateral sacroiliac joint block Possible bilateral sacroiliac joint RFA Diagnostic bilateral intra-articular hip joint injection Diagnostic bilateral femoral nerve block + obturator nerve block Possible bilateral femoral nerve + obturator nerve RFA Diagnostic right-sided L1-2lumbar epidural steroid injection Diagnostic right-sided L1-2transforaminal epidural steroid injection Diagnostic bilateralL4-5lumbar transforaminal epidural steroid injection Diagnostic bilateral cervical facet  block Possible bilateral cervical facet RFA Diagnostic right-sided cervical epidural steroid injection Diagnostic bilateral greater occipital nerve block Possible bilateral occipital nerve RFA Diagnostic bilateral intra-articular shoulder joint injection Diagnostic bilateral suprascapular nerve block Possible bilateral suprascapular nerve RFA   PRN Procedures: None at  this time     Provider-requested follow-up: Return in about 4 weeks (around 01/29/2018) for MedMgmt with Me Dionisio David), w/ Dr. Jerry Caras, in addition, MRI, F/U eval follow MRI poss change need.  Future Appointments  Date Time Provider [place]  01/12/2018  3:45 PM Pleas Koch, NP [facility]  01/29/2018  2:45 PM Vevelyn Francois, NP [facility] None   Primary Care Physician: Pleas Koch, NP Location: [facility] Outpatient Pain Management Facility Note by: Vevelyn Francois NP Date: 01/01/2018; Time: 3:50 PM  Pain Score Disclaimer: We use the NRS-11 scale. This is a self-reported, subjective measurement of pain severity with only modest accuracy. It is used primarily to identify changes within a particular patient. It must be understood that outpatient pain scales are significantly less accurate that those used for research, where they can be applied under ideal controlled circumstances with minimal exposure to variables. In reality, the score is likely to be a combination of pain intensity and pain affect, where pain affect describes the degree of emotional arousal or changes in action readiness caused by the sensory experience of pain. Factors such as social and work situation, setting, emotional state, anxiety levels, expectation, and prior pain experience may influence pain perception and show large inter-individual differences that may also be affected by time variables.  Patient instructions provided during this appointment: Patient Instructions   ____________________________________________________________________________________________  Medication Rules  Applies to: All patients receiving prescriptions (written or electronic).  Pharmacy of record: Pharmacy where electronic prescriptions will be sent. If written prescriptions are taken to a different pharmacy, please inform the nursing staff. The pharmacy listed in the electronic medical record should be the one where you would like electronic prescriptions to be sent.  Prescription refills: Only during scheduled appointments. Applies to both, written and electronic prescriptions.  NOTE: The following applies primarily to controlled substances (Opioid* Pain Medications).   Patient's responsibilities: 1. Pain Pills: Bring all pain pills to every appointment (except for procedure appointments). 2. Pill Bottles: Bring pills in original pharmacy bottle. Always bring newest bottle. Bring bottle, even if empty. 3. Medication refills: You are responsible for knowing and keeping track of what medications you need refilled. The day before your appointment, write a list of all prescriptions that need to be refilled. Bring that list to your appointment and give it to the admitting nurse. Prescriptions will be written only during appointments. If you forget a medication, it will not be "Called in", "Faxed", or "electronically sent". You will need to get another appointment to get these prescribed. 4. Prescription Accuracy: You are responsible for carefully inspecting your prescriptions before leaving our office. Have the discharge nurse carefully go over each prescription with you, before taking them home. Make sure that your name is accurately spelled, that your address is correct. Check the name and dose of your medication to make sure it is accurate. Check the number of pills, and the written instructions to make sure they are clear and accurate. Make sure that you are given enough medication to  last until your next medication refill appointment. 5. Taking Medication: Take medication as prescribed. Never take more pills than instructed. Never take medication more frequently than prescribed. Taking less pills or less frequently is permitted and encouraged, when it comes to controlled substances (written prescriptions).  6. Inform other Doctors: Always inform, all of your healthcare providers, of all the medications you take. 7. Pain Medication from other Providers: You are not allowed to accept any additional pain  medication from any other Doctor or Healthcare provider. There are two exceptions to this rule. (see below) In the event that you require additional pain medication, you are responsible for notifying us, as stated below. 8. Medication Agreement: You are responsible for carefully reading and following our Medication Agreement. This must be signed before receiving any prescriptions from our practice. Safely store a copy of your signed Agreement. Violations to the Agreement will result in no further prescriptions. (Additional copies of our Medication Agreement are available upon request.) 9. Laws, Rules, & Regulations: All patients are expected to follow all Federal and [employer], [employer], Rules, [employer]. Ignorance of the Laws does not constitute a valid excuse. The use of any illegal substances is prohibited. 10. Adopted CDC guidelines & recommendations: Target dosing levels will be at or below 60 MME/day. Use of benzodiazepines** is not recommended.  Exceptions: There are only two exceptions to the rule of not receiving pain medications from other Healthcare Providers. 1. Exception #1 (Emergencies): In the event of an emergency (i.e.: accident requiring emergency care), you are allowed to receive additional pain medication. However, you are responsible for: As soon as you are able, call our office [phone], at any time of the day or night, and leave a message stating your  name, the date and nature of the emergency, and the name and dose of the medication prescribed. In the event that your call is answered by a member of our staff, make sure to document and save the date, time, and the name of the person that took your information.  2. Exception #2 (Planned Surgery): In the event that you are scheduled by another doctor or dentist to have any type of surgery or procedure, you are allowed (for a period no longer than 30 days), to receive additional pain medication, for the acute post-op pain. However, in this case, you are responsible for picking up a copy of our "Post-op Pain Management for Surgeons" handout, and giving it to your surgeon or dentist. This document is available at our office, and does not require an appointment to obtain it. Simply go to our office during business hours (Monday-Thursday from 8:00 AM to 4:00 PM) (Friday 8:00 AM to 12:00 Noon) or if you have a scheduled appointment with [place], prior to your surgery, and ask for it by name. In addition, you will need to provide [place] with your name, name of your surgeon, type of surgery, and date of procedure or surgery.  *Opioid medications include: morphine, codeine, oxycodone, oxymorphone, hydrocodone, hydromorphone, meperidine, tramadol, tapentadol, buprenorphine, fentanyl, methadone. **Benzodiazepine medications include: diazepam (Valium), alprazolam (Xanax), clonazepam (Klonopine), lorazepam (Ativan), clorazepate (Tranxene), chlordiazepoxide (Librium), estazolam (Prosom), oxazepam (Serax), temazepam (Restoril), triazolam (Halcion) (Last updated: 12/25/2017) ____________________________________________________________________________________________     _

## 2018-01-01 NOTE — Patient Instructions (Addendum)
____________________________________________________________________________________________  Medication Rules  Applies to: All patients receiving prescriptions (written or electronic).  Pharmacy of record: Pharmacy where electronic prescriptions will be sent. If written prescriptions are taken to a different pharmacy, please inform the nursing staff. The pharmacy listed in the electronic medical record should be the one where you would like electronic prescriptions to be sent.  Prescription refills: Only during scheduled appointments. Applies to both, written and electronic prescriptions.  NOTE: The following applies primarily to controlled substances (Opioid* Pain Medications).   Patient's responsibilities: 1. Pain Pills: Bring all pain pills to every appointment (except for procedure appointments). 2. Pill Bottles: Bring pills in original pharmacy bottle. Always bring newest bottle. Bring bottle, even if empty. 3. Medication refills: You are responsible for knowing and keeping track of what medications you need refilled. The day before your appointment, write a list of all prescriptions that need to be refilled. Bring that list to your appointment and give it to the admitting nurse. Prescriptions will be written only during appointments. If you forget a medication, it will not be "Called in", "Faxed", or "electronically sent". You will need to get another appointment to get these prescribed. 4. Prescription Accuracy: You are responsible for carefully inspecting your prescriptions before leaving our office. Have the discharge nurse carefully go over each prescription with you, before taking them home. Make sure that your name is accurately spelled, that your address is correct. Check the name and dose of your medication to make sure it is accurate. Check the number of pills, and the written instructions to make sure they are clear and accurate. Make sure that you are given enough medication to last  until your next medication refill appointment. 5. Taking Medication: Take medication as prescribed. Never take more pills than instructed. Never take medication more frequently than prescribed. Taking less pills or less frequently is permitted and encouraged, when it comes to controlled substances (written prescriptions).  6. Inform other Doctors: Always inform, all of your healthcare providers, of all the medications you take. 7. Pain Medication from other Providers: You are not allowed to accept any additional pain medication from any other Doctor or Healthcare provider. There are two exceptions to this rule. (see below) In the event that you require additional pain medication, you are responsible for notifying us, as stated below. 8. Medication Agreement: You are responsible for carefully reading and following our Medication Agreement. This must be signed before receiving any prescriptions from our practice. Safely store a copy of your signed Agreement. Violations to the Agreement will result in no further prescriptions. (Additional copies of our Medication Agreement are available upon request.) 9. Laws, Rules, & Regulations: All patients are expected to follow all Federal and State Laws, Statutes, Rules, & Regulations. Ignorance of the Laws does not constitute a valid excuse. The use of any illegal substances is prohibited. 10. Adopted CDC guidelines & recommendations: Target dosing levels will be at or below 60 MME/day. Use of benzodiazepines** is not recommended.  Exceptions: There are only two exceptions to the rule of not receiving pain medications from other Healthcare Providers. 1. Exception #1 (Emergencies): In the event of an emergency (i.e.: accident requiring emergency care), you are allowed to receive additional pain medication. However, you are responsible for: As soon as you are able, call our office [phone], at any time of the day or night, and leave a message stating your name, the  date and nature of the emergency, and the name and dose of the medication   prescribed. In the event that your call is answered by a member of our staff, make sure to document and save the date, time, and the name of the person that took your information.  2. Exception #2 (Planned Surgery): In the event that you are scheduled by another doctor or dentist to have any type of surgery or procedure, you are allowed (for a period no longer than 30 days), to receive additional pain medication, for the acute post-op pain. However, in this case, you are responsible for picking up a copy of our "Post-op Pain Management for Surgeons" handout, and giving it to your surgeon or dentist. This document is available at our office, and does not require an appointment to obtain it. Simply go to our office during business hours (Monday-Thursday from 8:00 AM to 4:00 PM) (Friday 8:00 AM to 12:00 Noon) or if you have a scheduled appointment with us, prior to your surgery, and ask for it by name. In addition, you will need to provide us with your name, name of your surgeon, type of surgery, and date of procedure or surgery.  *Opioid medications include: morphine, codeine, oxycodone, oxymorphone, hydrocodone, hydromorphone, meperidine, tramadol, tapentadol, buprenorphine, fentanyl, methadone. **Benzodiazepine medications include: diazepam (Valium), alprazolam (Xanax), clonazepam (Klonopine), lorazepam (Ativan), clorazepate (Tranxene), chlordiazepoxide (Librium), estazolam (Prosom), oxazepam (Serax), temazepam (Restoril), triazolam (Halcion) (Last updated: 12/25/2017) ____________________________________________________________________________________________    

## 2018-01-05 ENCOUNTER — Telehealth: Payer: Self-pay | Admitting: *Deleted

## 2018-01-12 ENCOUNTER — Ambulatory Visit: Payer: PPO | Admitting: Primary Care

## 2018-01-13 ENCOUNTER — Encounter: Payer: Self-pay | Admitting: Primary Care

## 2018-01-13 ENCOUNTER — Ambulatory Visit (INDEPENDENT_AMBULATORY_CARE_PROVIDER_SITE_OTHER): Payer: PPO | Admitting: Primary Care

## 2018-01-13 VITALS — BP 114/70 | HR 76 | Temp 98.6°F | Ht 61.0 in | Wt 147.5 lb

## 2018-01-13 DIAGNOSIS — E785 Hyperlipidemia, unspecified: Secondary | ICD-10-CM

## 2018-01-13 DIAGNOSIS — N393 Stress incontinence (female) (male): Secondary | ICD-10-CM

## 2018-01-13 LAB — LIPID PANEL
CHOLESTEROL: 268 mg/dL — AB (ref 0–200)
HDL: 47.9 mg/dL (ref >39.00)
LDL Cholesterol: 180 mg/dL — ABNORMAL HIGH (ref 0–99)
NonHDL: 220.28
Total CHOL/HDL Ratio: 6
Triglycerides: 199 mg/dL — ABNORMAL HIGH (ref 0.0–149.0)
VLDL: 39.8 mg/dL (ref 0.0–40.0)

## 2018-01-13 MED ORDER — OXYBUTYNIN CHLORIDE ER 5 MG PO TB24: 5.0000 mg | ORAL_TABLET | Freq: Every day | ORAL | 0 refills | Status: DC

## 2018-01-13 NOTE — Assessment & Plan Note (Signed)
Do not suspect cauda equina. HPI sounds very classic for stress incontinence. Recommended daily Kegal exercises and physical therapy for which she declines. She'd like to start medication.  Rx for oxybutynin ER 5 mg sent to pharmacy. Discussed potential side effects, she verbalized understanding. She will update in a few weeks.

## 2018-01-13 NOTE — Patient Instructions (Signed)
Stop by the lab prior to leaving today. I will notify you of your results once received.   Start oxybutynin ER 5 mg tablets for urinary incontinence. Take at bedtime.  Please update me in a few weeks.  It was a pleasure to see you today!

## 2018-01-13 NOTE — Progress Notes (Signed)
Subjective:    Patient ID: Karen Edwards, female    DOB: December 16, 1965, 52 y.o.   MRN: [id number]  HPI  [name] is a 52 year old female who presents today with a chief complaint of urinary incontinence. She's also due for repeat cholesterol check.  She will experience mild incontinence of urine when she coughs, sneezes, bends forward. She denies urge incontinence, complete loss of bladder control, groin numbness. She first noticed her symptoms in late 2018 that have progressed over the last several weeks. She's losing about 1 tablespoon of urine during each incident and symptoms are daily. She does do Kegal exercises sometimes. She has occasional feelings pelvic pressure. She has a gynecologist and has not had recent evaluation.   Review of Systems  Constitutional: Negative for fever.  Gastrointestinal: Negative for abdominal pain and nausea.  Genitourinary: Negative for dysuria, flank pain, hematuria, pelvic pain and vaginal discharge.       Urinary incontinence        Past Medical History:  Diagnosis Date  . Anxiety   . Chronic back pain   . Degenerative joint disease   . Depression   . Hypotension   . IBS (irritable bowel syndrome) 05/2015  . MVP (mitral valve prolapse)      Social History   Socioeconomic History  . Marital status: Married    Spouse name: Not on file  . Number of children: 2  . Years of education: Not on file  . Highest education level: Not on file  Social Needs  . Financial resource strain: Not hard at all  . Food insecurity - worry: Patient refused  . Food insecurity - inability: Patient refused  . Transportation needs - medical: Patient refused  . Transportation needs - non-medical: Patient refused  Occupational History  . Occupation: disabled  Tobacco Use  . Smoking status: Current Every Day Smoker    Packs/day: 0.10    Years: 37.00    Pack years: 3.70    Types: Cigarettes  . Smokeless tobacco: Never Used  Substance and Sexual Activity   . Alcohol use: Yes    Alcohol/week: 0.6 oz    Types: 1 Cans of beer per week    Comment: drinks an occasional beer  . Drug use: No  . Sexual activity: Yes    Birth control/protection: None, Surgical    Comment: tubal ligation  Other Topics Concern  . Not on file  Social History Narrative   Married.   2 children. One boy and one girl.   On disability.   Playing with her dogs, gardening.    Past Surgical History:  Procedure Laterality Date  . ANKLE SURGERY Right 03/02/2014   repair of tendon and sural nerve right ankle  . CERVICAL SPINE SURGERY  02/21/2016   arthrodesis of anterior cervical spine (fusion of C5-6,C6-7 and insertion of bone allograft  . CESAREAN SECTION    . CESAREAN SECTION WITH BILATERAL TUBAL LIGATION  1990  . COLONOSCOPY  04/19/2015  . HEMORRHOID SURGERY N/A 06/16/2015   Procedure: HEMORRHOIDECTOMY;  Surgeon: Leonie Green, MD;  Location: [facility] ORS;  Service: General;  Laterality: N/A;  . HYSTEROSCOPY W/D&C  12/24/2013    Family History  Problem Relation Age of Onset  . Arthritis Mother   . Depression Mother   . Hyperlipidemia Mother   . Hypertension Mother   . Kidney disease Mother   . Vision loss Mother   . Hypothyroidism Mother   . Hearing loss Father   .  Hyperlipidemia Father   . Hypertension Father   . Diabetes Mellitus I Maternal Grandmother   . Stroke Maternal Grandmother   . Congestive Heart Failure Maternal Grandfather   . Heart attack Maternal Grandfather   . Prostate cancer Paternal Grandfather   . Thyroid disease Maternal Aunt   . Diabetes Maternal Aunt   . Gout Maternal Aunt     Allergies  Allergen Reactions  . No Known Allergies     Current Outpatient Medications on File Prior to Visit  Medication Sig Dispense Refill  . Cyanocobalamin (B-12 PO) Take 2,500 mcg by mouth every other day.    Marland Kitchen FLUoxetine (PROZAC) 20 MG capsule Take 3 capsules (60 mg total) by mouth daily. 270 capsule 3  . HYDROcodone-acetaminophen  (NORCO/VICODIN) 5-325 MG tablet Take 1 tablet by mouth 2 (two) times daily. 60 tablet 0  . linaclotide (LINZESS) 145 MCG CAPS capsule Take 1 capsule (145 mcg total) by mouth daily before breakfast. 90 capsule 0  . methocarbamol (ROBAXIN) 500 MG tablet Take 1 tablet (500 mg total) by mouth every 8 (eight) hours as needed for muscle spasms. 90 tablet 0  . Potassium 99 MG TABS Take 1 tablet by mouth daily.    . traZODone (DESYREL) 100 MG tablet Take 3 tablets (300 mg total) by mouth at bedtime. 270 tablet 3   No current facility-administered medications on file prior to visit.     BP 114/70   Pulse 76   Temp 98.6 F (37 C) (Oral)   Ht 5\' 1"  (1.549 m)   Wt 147 lb 8 oz (66.9 kg)   LMP 05/05/2017 (Exact Date)   SpO2 96%   BMI 27.87 kg/m    Objective:   Physical Exam  Constitutional: She appears well-nourished.  Neck: Neck supple.  Cardiovascular: Normal rate and regular rhythm.  Pulmonary/Chest: Effort normal and breath sounds normal.  Skin: Skin is warm and dry.          Assessment & Plan:

## 2018-01-13 NOTE — Assessment & Plan Note (Addendum)
Repeat lipids pending.  

## 2018-01-14 ENCOUNTER — Ambulatory Visit
Admission: RE | Admit: 2018-01-14 | Discharge: 2018-01-14 | Disposition: A | Discharge status: Home / Self Care | Payer: PPO | Source: Ambulatory Visit | Attending: Nurse Practitioner | Admitting: Nurse Practitioner

## 2018-01-14 ENCOUNTER — Encounter: Payer: Self-pay | Admitting: Primary Care

## 2018-01-14 DIAGNOSIS — M542 Cervicalgia: Secondary | ICD-10-CM

## 2018-01-14 DIAGNOSIS — M5136 Other intervertebral disc degeneration, lumbar region: Secondary | ICD-10-CM | POA: Diagnosis not present

## 2018-01-14 DIAGNOSIS — E785 Hyperlipidemia, unspecified: Secondary | ICD-10-CM

## 2018-01-14 DIAGNOSIS — M545 Low back pain: Secondary | ICD-10-CM | POA: Diagnosis not present

## 2018-01-14 MED ORDER — ATORVASTATIN CALCIUM 10 MG PO TABS: ORAL_TABLET | ORAL | 1 refills | Status: DC

## 2018-01-15 NOTE — Progress Notes (Signed)
Results were reviewed and found to be: mildly abnormal  No acute injury or pathology identified  Review would suggest interventional pain management techniques may be of benefit 

## 2018-01-19 ENCOUNTER — Encounter: Payer: Self-pay | Admitting: Pain Medicine

## 2018-01-19 ENCOUNTER — Ambulatory Visit: Payer: PPO | Attending: Pain Medicine | Admitting: Pain Medicine

## 2018-01-19 VITALS — BP 123/75 | HR 89 | Temp 98.4°F | Ht 61.0 in | Wt 140.0 lb

## 2018-01-19 DIAGNOSIS — F17210 Nicotine dependence, cigarettes, uncomplicated: Secondary | ICD-10-CM | POA: Diagnosis not present

## 2018-01-19 DIAGNOSIS — M25551 Pain in right hip: Secondary | ICD-10-CM

## 2018-01-19 DIAGNOSIS — M47816 Spondylosis without myelopathy or radiculopathy, lumbar region: Secondary | ICD-10-CM

## 2018-01-19 DIAGNOSIS — M5441 Lumbago with sciatica, right side: Secondary | ICD-10-CM

## 2018-01-19 DIAGNOSIS — M5136 Other intervertebral disc degeneration, lumbar region: Secondary | ICD-10-CM | POA: Diagnosis not present

## 2018-01-19 DIAGNOSIS — M5442 Lumbago with sciatica, left side: Secondary | ICD-10-CM | POA: Diagnosis not present

## 2018-01-19 DIAGNOSIS — M79605 Pain in left leg: Secondary | ICD-10-CM | POA: Diagnosis not present

## 2018-01-19 DIAGNOSIS — Z79891 Long term (current) use of opiate analgesic: Secondary | ICD-10-CM | POA: Diagnosis not present

## 2018-01-19 DIAGNOSIS — M5126 Other intervertebral disc displacement, lumbar region: Secondary | ICD-10-CM

## 2018-01-19 DIAGNOSIS — Z79899 Other long term (current) drug therapy: Secondary | ICD-10-CM | POA: Diagnosis not present

## 2018-01-19 DIAGNOSIS — G894 Chronic pain syndrome: Secondary | ICD-10-CM | POA: Diagnosis not present

## 2018-01-19 DIAGNOSIS — M47812 Spondylosis without myelopathy or radiculopathy, cervical region: Secondary | ICD-10-CM | POA: Diagnosis not present

## 2018-01-19 DIAGNOSIS — Z9889 Other specified postprocedural states: Secondary | ICD-10-CM | POA: Insufficient documentation

## 2018-01-19 DIAGNOSIS — M533 Sacrococcygeal disorders, not elsewhere classified: Secondary | ICD-10-CM | POA: Diagnosis not present

## 2018-01-19 DIAGNOSIS — M79604 Pain in right leg: Secondary | ICD-10-CM | POA: Diagnosis not present

## 2018-01-19 DIAGNOSIS — G8929 Other chronic pain: Secondary | ICD-10-CM

## 2018-01-19 DIAGNOSIS — M899 Disorder of bone, unspecified: Secondary | ICD-10-CM

## 2018-01-19 DIAGNOSIS — M25552 Pain in left hip: Secondary | ICD-10-CM

## 2018-01-19 NOTE — Patient Instructions (Addendum)
____________________________________________________________________________________________  Preparing for Procedure with Sedation  Instructions: . Oral Intake: Do not eat or drink anything for at least 8 hours prior to your procedure. . Transportation: Public transportation is not allowed. Bring an adult driver. The driver must be physically present in our waiting room before any procedure can be started. Marland Kitchen Physical Assistance: Bring an adult physically capable of assisting you, in the event you need help. This adult should keep you company at home for at least 6 hours after the procedure. . Blood Pressure Medicine: Take your blood pressure medicine with a sip of water the morning of the procedure. . Blood thinners:  . Diabetics on insulin: Notify the staff so that you can be scheduled 1st case in the morning. If your diabetes requires high dose insulin, take only  of your normal insulin dose the morning of the procedure and notify the staff that you have done so. . Preventing infections: Shower with an antibacterial soap the morning of your procedure. . Build-up your immune system: Take 1000 mg of Vitamin C with every meal (3 times a day) the day prior to your procedure. Marland Kitchen Antibiotics: Inform the staff if you have a condition or reason that requires you to take antibiotics before dental procedures. . Pregnancy: If you are pregnant, call and cancel the procedure. . Sickness: If you have a cold, fever, or any active infections, call and cancel the procedure. . Arrival: You must be in the facility at least 30 minutes prior to your scheduled procedure. . Children: Do not bring children with you. . Dress appropriately: Bring dark clothing that you would not mind if they get stained. . Valuables: Do not bring any jewelry or valuables.  Procedure appointments are reserved for interventional treatments only. Marland Kitchen No Prescription Refills. . No medication changes will be discussed during procedure  appointments. . No disability issues will be discussed.  Remember:  Regular Business hours are:  Monday to Thursday 8:00 AM to 4:00 PM  Provider's Schedule: Milinda Pointer, MD:  Procedure days: Tuesday and Thursday 7:30 AM to 4:00 PM  Gillis Santa, MD:  Procedure days: Monday and Wednesday 7:30 AM to 4:00 PM ____________________________________________________________________________________________   GENERAL RISKS AND COMPLICATIONS  What are the risk, side effects and possible complications? Generally speaking, most procedures are safe.  However, with any procedure there are risks, side effects, and the possibility of complications.  The risks and complications are dependent upon the sites that are lesioned, or the type of nerve block to be performed.  The closer the procedure is to the spine, the more serious the risks are.  Great care is taken when placing the radio frequency needles, block needles or lesioning probes, but sometimes complications can occur. 1. Infection: Any time there is an injection through the skin, there is a risk of infection.  This is why sterile conditions are used for these blocks.  There are four possible types of infection. 1. Localized skin infection. 2. Central Nervous System Infection-This can be in the form of Meningitis, which can be deadly. 3. Epidural Infections-This can be in the form of an epidural abscess, which can cause pressure inside of the spine, causing compression of the spinal cord with subsequent paralysis. This would require an emergency surgery to decompress, and there are no guarantees that the patient would recover from the paralysis. 4. Discitis-This is an infection of the intervertebral discs.  It occurs in about 1% of discography procedures.  It is difficult to treat and it may  lead to surgery.        2. Pain: the needles have to go through skin and soft tissues, will cause soreness.       3. Damage to internal structures:  The  nerves to be lesioned may be near blood vessels or    other nerves which can be potentially damaged.       4. Bleeding: Bleeding is more common if the patient is taking blood thinners such as  aspirin, Coumadin, Ticiid, Plavix, etc., or if he/she have some genetic predisposition  such as hemophilia. Bleeding into the spinal canal can cause compression of the spinal  cord with subsequent paralysis.  This would require an emergency surgery to  decompress and there are no guarantees that the patient would recover from the  paralysis.       5. Pneumothorax:  Puncturing of a lung is a possibility, every time a needle is introduced in  the area of the chest or upper back.  Pneumothorax refers to free air around the  collapsed lung(s), inside of the thoracic cavity (chest cavity).  Another two possible  complications related to a similar event would include: Hemothorax and Chylothorax.   These are variations of the Pneumothorax, where instead of air around the collapsed  lung(s), you may have blood or chyle, respectively.       6. Spinal headaches: They may occur with any procedures in the area of the spine.       7. Persistent CSF (Cerebro-Spinal Fluid) leakage: This is a rare problem, but may occur  with prolonged intrathecal or epidural catheters either due to the formation of a fistulous  track or a dural tear.       8. Nerve damage: By working so close to the spinal cord, there is always a possibility of  nerve damage, which could be as serious as a permanent spinal cord injury with  paralysis.       9. Death:  Although rare, severe deadly allergic reactions known as "Anaphylactic  reaction" can occur to any of the medications used.      10. Worsening of the symptoms:  We can always make thing worse.  What are the chances of something like this happening? Chances of any of this occuring are extremely low.  By statistics, you have more of a chance of getting killed in a motor vehicle accident: while driving  to the hospital than any of the above occurring .  Nevertheless, you should be aware that they are possibilities.  In general, it is similar to taking a shower.  Everybody knows that you can slip, hit your head and get killed.  Does that mean that you should not shower again?  Nevertheless always keep in mind that statistics do not mean anything if you happen to be on the wrong side of them.  Even if a procedure has a 1 (one) in a 1,000,000 (million) chance of going wrong, it you happen to be that one..Also, keep in mind that by statistics, you have more of a chance of having something go wrong when taking medications.  Who should not have this procedure? If you are on a blood thinning medication (e.g. Coumadin, Plavix, see list of "Blood Thinners"), or if you have an active infection going on, you should not have the procedure.  If you are taking any blood thinners, please inform your physician.  How should I prepare for this procedure?  Do not eat or drink anything at least  six hours prior to the procedure.  Bring a driver with you .  It cannot be a taxi.  Come accompanied by an adult that can drive you back, and that is strong enough to help you if your legs get weak or numb from the local anesthetic.  Take all of your medicines the morning of the procedure with just enough water to swallow them.  If you have diabetes, make sure that you are scheduled to have your procedure done first thing in the morning, whenever possible.  If you have diabetes, take only half of your insulin dose and notify our nurse that you have done so as soon as you arrive at the clinic.  If you are diabetic, but only take blood sugar pills (oral hypoglycemic), then do not take them on the morning of your procedure.  You may take them after you have had the procedure.  Do not take aspirin or any aspirin-containing medications, at least eleven (11) days prior to the procedure.  They may prolong bleeding.  Wear loose  fitting clothing that may be easy to take off and that you would not mind if it got stained with Betadine or blood.  Do not wear any jewelry or perfume  Remove any nail coloring.  It will interfere with some of our monitoring equipment.  NOTE: Remember that this is not meant to be interpreted as a complete list of all possible complications.  Unforeseen problems may occur.  BLOOD THINNERS The following drugs contain aspirin or other products, which can cause increased bleeding during surgery and should not be taken for 2 weeks prior to and 1 week after surgery.  If you should need take something for relief of minor pain, you may take acetaminophen which is found in Tylenol,m Datril, Anacin-3 and Panadol. It is not blood thinner. The products listed below are.  Do not take any of the products listed below in addition to any listed on your instruction sheet.  A.P.C or A.P.C with Codeine Codeine Phosphate Capsules #3 Ibuprofen Ridaura  ABC compound Congesprin Imuran rimadil  Advil Cope Indocin Robaxisal  Alka-Seltzer Effervescent Pain Reliever and Antacid Coricidin or Coricidin-D  Indomethacin Rufen  Alka-Seltzer plus Cold Medicine Cosprin Ketoprofen S-A-C Tablets  Anacin Analgesic Tablets or Capsules Coumadin Korlgesic Salflex  Anacin Extra Strength Analgesic tablets or capsules CP-2 Tablets Lanoril Salicylate  Anaprox Cuprimine Capsules Levenox Salocol  Anexsia-D Dalteparin Magan Salsalate  Anodynos Darvon compound Magnesium Salicylate Sine-off  Ansaid Dasin Capsules Magsal Sodium Salicylate  Anturane Depen Capsules Marnal Soma  APF Arthritis pain formula Dewitt's Pills Measurin Stanback  Argesic Dia-Gesic Meclofenamic Sulfinpyrazone  Arthritis Bayer Timed Release Aspirin Diclofenac Meclomen Sulindac  Arthritis pain formula Anacin Dicumarol Medipren Supac  Analgesic (Safety coated) Arthralgen Diffunasal Mefanamic Suprofen  Arthritis Strength Bufferin Dihydrocodeine Mepro Compound Suprol   Arthropan liquid Dopirydamole Methcarbomol with Aspirin Synalgos  ASA tablets/Enseals Disalcid Micrainin Tagament  Ascriptin Doan's Midol Talwin  Ascriptin A/D Dolene Mobidin Tanderil  Ascriptin Extra Strength Dolobid Moblgesic Ticlid  Ascriptin with Codeine Doloprin or Doloprin with Codeine Momentum Tolectin  Asperbuf Duoprin Mono-gesic Trendar  Aspergum Duradyne Motrin or Motrin IB Triminicin  Aspirin plain, buffered or enteric coated Durasal Myochrisine Trigesic  Aspirin Suppositories Easprin Nalfon Trillsate  Aspirin with Codeine Ecotrin Regular or Extra Strength Naprosyn Uracel  Atromid-S Efficin Naproxen Ursinus  Auranofin Capsules Elmiron Neocylate Vanquish  Axotal Emagrin Norgesic Verin  Azathioprine Empirin or Empirin with Codeine Normiflo Vitamin E  Azolid Emprazil Nuprin Voltaren  Bayer Aspirin  plain, buffered or children's or timed BC Tablets or powders Encaprin Orgaran Warfarin Sodium  Buff-a-Comp Enoxaparin Orudis Zorpin  Buff-a-Comp with Codeine Equegesic Os-Cal-Gesic   Buffaprin Excedrin plain, buffered or Extra Strength Oxalid   Bufferin Arthritis Strength Feldene Oxphenbutazone   Bufferin plain or Extra Strength Feldene Capsules Oxycodone with Aspirin   Bufferin with Codeine Fenoprofen Fenoprofen Pabalate or Pabalate-SF   Buffets II Flogesic Panagesic   Buffinol plain or Extra Strength Florinal or Florinal with Codeine Panwarfarin   Buf-Tabs Flurbiprofen Penicillamine   Butalbital Compound Four-way cold tablets Penicillin   Butazolidin Fragmin Pepto-Bismol   Carbenicillin Geminisyn Percodan   Carna Arthritis Reliever Geopen Persantine   Carprofen Gold's salt Persistin   Chloramphenicol Goody's Phenylbutazone   Chloromycetin Haltrain Piroxlcam   Clmetidine heparin Plaquenil   Cllnoril Hyco-pap Ponstel   Clofibrate Hydroxy chloroquine Propoxyphen         Before stopping any of these medications, be sure to consult the physician who ordered them.  Some, such as  Coumadin (Warfarin) are ordered to prevent or treat serious conditions such as "deep thrombosis", "pumonary embolisms", and other heart problems.  The amount of time that you may need off of the medication may also vary with the medication and the reason for which you were taking it.  If you are taking any of these medications, please make sure you notify your pain physician before you undergo any procedures.         Epidural Steroid Injection Patient Information  Description: The epidural space surrounds the nerves as they exit the spinal cord.  In some patients, the nerves can be compressed and inflamed by a bulging disc or a tight spinal canal (spinal stenosis).  By injecting steroids into the epidural space, we can bring irritated nerves into direct contact with a potentially helpful medication.  These steroids act directly on the irritated nerves and can reduce swelling and inflammation which often leads to decreased pain.  Epidural steroids may be injected anywhere along the spine and from the neck to the low back depending upon the location of your pain.   After numbing the skin with local anesthetic (like Novocaine), a small needle is passed into the epidural space slowly.  You may experience a sensation of pressure while this is being done.  The entire block usually last less than 10 minutes.  Conditions which may be treated by epidural steroids:   Low back and leg pain  Neck and arm pain  Spinal stenosis  Post-laminectomy syndrome  Herpes zoster (shingles) pain  Pain from compression fractures  Preparation for the injection:  1. Do not eat any solid food or dairy products within 8 hours of your appointment.  2. You may drink clear liquids up to 3 hours before appointment.  Clear liquids include water, black coffee, juice or soda.  No milk or cream please. 3. You may take your regular medication, including pain medications, with a sip of water before your appointment   Diabetics should hold regular insulin (if taken separately) and take 1/2 normal NPH dos the morning of the procedure.  Carry some sugar containing items with you to your appointment. 4. A driver must accompany you and be prepared to drive you home after your procedure.  5. Bring all your current medications with your. 6. An IV may be inserted and sedation may be given at the discretion of the physician.   7. A blood pressure cuff, EKG and other monitors will often be applied during the  procedure.  Some patients may need to have extra oxygen administered for a short period. 8. You will be asked to provide medical information, including your allergies, prior to the procedure.  We must know immediately if you are taking blood thinners (like Coumadin/Warfarin)  Or if you are allergic to IV iodine contrast (dye). We must know if you could possible be pregnant.  Possible side-effects:  Bleeding from needle site  Infection (rare, may require surgery)  Nerve injury (rare)  Numbness & tingling (temporary)  Difficulty urinating (rare, temporary)  Spinal headache ( a headache worse with upright posture)  Light -headedness (temporary)  Pain at injection site (several days)  Decreased blood pressure (temporary)  Weakness in arm/leg (temporary)  Pressure sensation in back/neck (temporary)  Call if you experience:  Fever/chills associated with headache or increased back/neck pain.  Headache worsened by an upright position.  New onset weakness or numbness of an extremity below the injection site  Hives or difficulty breathing (go to the emergency room)  Inflammation or drainage at the infection site  Severe back/neck pain  Any new symptoms which are concerning to you  Please note:  Although the local anesthetic injected can often make your back or neck feel good for several hours after the injection, the pain will likely return.  It takes 3-7 days for steroids to work in the epidural  space.  You may not notice any pain relief for at least that one week.  If effective, we will often do a series of three injections spaced 3-6 weeks apart to maximally decrease your pain.  After the initial series, we generally will wait several months before considering a repeat injection of the same type.  If you have any questions, please call [phone] [facility]

## 2018-01-19 NOTE — Progress Notes (Signed)
Patient's Name: Karen Edwards  MRN: [id number]  Referring Provider: Pleas Koch, NP  DOB: November 25, 1965  PCP: Pleas Koch, NP  DOS: 01/20/2018  Note by: Gaspar Cola, MD  Service setting: Ambulatory outpatient  Specialty: Interventional Pain Management  Patient type: Established  Location: [facility] (AMB) Pain Management Facility  Visit type: Interventional Procedure   Primary Reason for Visit: Interventional Pain Management Treatment. CC: Back Pain (lower, right)  Procedure:       Anesthesia, Analgesia, Anxiolysis:  Type: Diagnostic Inter-Laminar Epidural Steroid Injection #1  Region: Lumbar Level: L1-2 Level. Laterality: Right Paramedial  Type: Moderate (Conscious) Sedation combined with Local Anesthesia Indication(s): Analgesia and Anxiety Route: Intravenous (IV) IV Access: Secured Sedation: Meaningful verbal contact was maintained at all times during the procedure  Local Anesthetic: Lidocaine 1-2%   Indications: 1. DDD (degenerative disc disease), lumbar   2. 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 Chronic low back pain (Primary Area of Pain) (Bilateral) (R>L)   4. Chronic lower extremity pain (Secondary Area of Pain) (Bilateral) (R>L)    Pain Score: Pre-procedure: 3 /10 Post-procedure: 0-No pain/10  Pre-op Assessment:  Karen Edwards is a 52 y.o. (year old), female patient, seen today for interventional treatment. She  has a past surgical history that includes Cesarean section; Ankle surgery (Right, 03/02/2014); Hemorrhoid surgery (N/A, 06/16/2015); Cervical spine surgery (02/21/2016); Cesarean section with bilateral tubal ligation (1990); Colonoscopy (04/19/2015); and Hysteroscopy w/D&C (12/24/2013). Ms. Tesar has a current medication list which includes the following prescription(s): atorvastatin, cyanocobalamin, fluoxetine, hydrocodone-acetaminophen, methocarbamol, oxybutynin, potassium, trazodone, and linaclotide, and the following Facility-Administered Medications:  fentanyl, lactated ringers, and midazolam. Her primarily concern today is the Back Pain (lower, right)  Imaging Review  Lumbosacral Imaging: Lumbar MR wo contrast:  Results for orders placed during the hospital encounter of 01/14/18  MR LUMBAR SPINE WO CONTRAST   Narrative CLINICAL DATA:  Chronic low back pain with burning and catching sensation. Bilateral leg pain, right worse than left. No recent injury.  EXAM: MRI LUMBAR SPINE WITHOUT CONTRAST  TECHNIQUE: Multiplanar, multisequence MR imaging of the lumbar spine was performed. No intravenous contrast was administered.  COMPARISON:  MRI lumbar spine 02/20/2016.  FINDINGS: Segmentation:  Standard.  Alignment:  Maintained.  Vertebrae: A few small hemangiomas are seen. No fracture or worrisome lesion.  Conus medullaris and cauda equina: Conus extends to the T12-L1 level. Conus and cauda equina appear normal.  Paraspinal and other soft tissues: Negative.  Disc levels:  T11-12 is imaged in the sagittal plane only and negative.  T12-L1: Negative.  L1-2: Shallow central protrusion with cephalad extension is unchanged. The central canal and foramina are widely patent.  L2-3: Minimal disc bulge and mild facet degenerative disease without stenosis.  L3-4: Minimal disc bulge and mild facet degenerative change. No stenosis.  L4-5: Minimal disc bulge and mild facet arthropathy without stenosis.  L5-S1: Negative.  IMPRESSION: No change in the appearance of the lumbar spine. No new abnormality since the prior exam.  Mild degenerative disease without stenosis most notable at L1-2 where there is a shallow central disc protrusion with cephalad extension.   Electronically Signed   By: Inge Rise M.D.   On: 01/14/2018 20:44    Lumbar MR w/wo contrast:  Results for orders placed during the hospital encounter of 12/03/12  MR Lumbar Spine W Wo Contrast   Narrative *RADIOLOGY REPORT*  Clinical Data: Chronic low  back pain with left sided radicular symptoms.  No reported relevant prior surgery.  MRI LUMBAR  SPINE WITHOUT AND WITH CONTRAST  Technique:  Multiplanar and multiecho pulse sequences of the lumbar spine were obtained without and with intravenous contrast.  Contrast: 35mL MULTIHANCE GADOBENATE DIMEGLUMINE 529 MG/ML IV SOLN  Comparison: Lumbar spine radiographs 10/22/2012.  Findings: Radiographs demonstrate five lumbar type vertebral bodies.  The alignment is normal.  There is no evidence of fracture or pars defect.  Scattered tiny hemangiomas are noted.  The conus medullaris extends to the T12-L1 level and appears normal. There are no paraspinal abnormalities. Sagittal images demonstrate probable retroversion of the uterus.  There is a small subserosal uterine fibroid as well as central low density in the uterus which may reflect a larger fibroid.  This is only partially imaged on the current examination.  L1-L2:  Small right paracentral disc protrusion.  No spinal stenosis or nerve root encroachment.  L2-L3:  Mild disc bulging.  No spinal stenosis or nerve root encroachment.  L3-L4:  Disc height and hydration are maintained.  There is mild facet and ligamentous hypertrophy.  No spinal stenosis or nerve root encroachment.  L4-L5:  Mild disc bulging with mild facet and ligamentous hypertrophy.  No spinal stenosis or nerve root encroachment.  L5-S1:  Disc height and hydration are maintained.  No spinal stenosis or nerve root encroachment.  IMPRESSION:  1.  Small right paracentral disc protrusion at L1-L2. 2.  Mild disc bulging and facet disease from L2-L3 through L4-L5. 3.  No significant spinal stenosis or nerve root encroachment. 4.  Suspected uterine fibroids, only partially imaged on the current examination.   Original Report Authenticated By: Richardean Sale, M.D.    Lumbar DG (Complete) 4+V:  Results for orders placed during the hospital encounter of 10/22/12  DG  Lumbar Spine Complete   Narrative *RADIOLOGY REPORT*  Clinical Data: Back pain.  LUMBAR SPINE - COMPLETE 4+ VIEW  Comparison: None  Findings: The lateral film demonstrates normal alignment. Vertebral bodies and disc spaces are maintained.  No acute bony findings.  Normal alignment of the facet joints and no pars defects.  The visualized bony pelvis in intact.  IMPRESSION: Normal alignment and no acute bony findings or degenerative changes.   Original Report Authenticated By: Marijo Sanes, M.D.    Lumbar DG Bending views:  Results for orders placed during the hospital encounter of 09/08/17  DG Lumbar Spine Complete W/Bend   Narrative CLINICAL DATA:  Chronic pain, pain clinic referral.  EXAM: LUMBAR SPINE - COMPLETE WITH BENDING VIEWS  COMPARISON:  Lumbar spine MRI of February 20, 2016  FINDINGS: The lumbar vertebral bodies are preserved in height. The pedicles and transverse processes are intact. The disc space heights are well maintained. There is no spondylolisthesis. There is no significant facet joint hypertrophy. There is no evidence of ligamentous instability as the patient moves from the neutral to the flexed and to the extended positions. The observed portions of the sacrum are normal.  IMPRESSION: There is no acute or significant chronic bony abnormality of the lumbar spine.   Electronically Signed   By: David  Martinique M.D.   On: 09/08/2017 15:40    Note: Imaging results reviewed.         Initial Vital Signs:  Pulse Rate: 95 Temp: 98.1 F (36.7 C) Resp: 18 BP: 116/68 SpO2: 98 %  BMI: Estimated body mass index is 27.34 kg/m as calculated from the following:   Height as of this encounter: 5' (1.524 m).   Weight as of this encounter: 140 lb (63.5 kg).  Risk Assessment:  Allergies: Reviewed. She is allergic to no known allergies.  Allergy Precautions: None required Coagulopathies: Reviewed. None identified.  Blood-thinner therapy: None at this  time Active Infection(s): Reviewed. None identified. Ms. Mawhinney is afebrile  Site Confirmation: Ms. Tomasso was asked to confirm the procedure and laterality before marking the site Procedure checklist: Completed Consent: Before the procedure and under the influence of no sedative(s), amnesic(s), or anxiolytics, the patient was informed of the treatment options, risks and possible complications. To fulfill our ethical and legal obligations, as recommended by the American Medical Association's Code of Ethics, I have informed the patient of my clinical impression; the nature and purpose of the treatment or procedure; the risks, benefits, and possible complications of the intervention; the alternatives, including doing nothing; the risk(s) and benefit(s) of the alternative treatment(s) or procedure(s); and the risk(s) and benefit(s) of doing nothing. The patient was provided information about the general risks and possible complications associated with the procedure. These may include, but are not limited to: failure to achieve desired goals, infection, bleeding, organ or nerve damage, allergic reactions, paralysis, and death. In addition, the patient was informed of those risks and complications associated to Spine-related procedures, such as failure to decrease pain; infection (i.e.: Meningitis, epidural or intraspinal abscess); bleeding (i.e.: epidural hematoma, subarachnoid hemorrhage, or any other type of intraspinal or peri-dural bleeding); organ or nerve damage (i.e.: Any type of peripheral nerve, nerve root, or spinal cord injury) with subsequent damage to sensory, motor, and/or autonomic systems, resulting in permanent pain, numbness, and/or weakness of one or several areas of the body; allergic reactions; (i.e.: anaphylactic reaction); and/or death. Furthermore, the patient was informed of those risks and complications associated with the medications. These include, but are not limited to: allergic  reactions (i.e.: anaphylactic or anaphylactoid reaction(s)); adrenal axis suppression; blood sugar elevation that in diabetics may result in ketoacidosis or comma; water retention that in patients with history of congestive heart failure may result in shortness of breath, pulmonary edema, and decompensation with resultant heart failure; weight gain; swelling or edema; medication-induced neural toxicity; particulate matter embolism and blood vessel occlusion with resultant organ, and/or nervous system infarction; and/or aseptic necrosis of one or more joints. Finally, the patient was informed that Medicine is not an exact science; therefore, there is also the possibility of unforeseen or unpredictable risks and/or possible complications that may result in a catastrophic outcome. The patient indicated having understood very clearly. We have given the patient no guarantees and we have made no promises. Enough time was given to the patient to ask questions, all of which were answered to the patient's satisfaction. Ms. Sek has indicated that she wanted to continue with the procedure. Attestation: I, the ordering provider, attest that I have discussed with the patient the benefits, risks, side-effects, alternatives, likelihood of achieving goals, and potential problems during recovery for the procedure that I have provided informed consent. Date  Time: 01/20/2018  8:31 AM  Pre-Procedure Preparation:  Monitoring: As per clinic protocol. Respiration, ETCO2, SpO2, BP, heart rate and rhythm monitor placed and checked for adequate function Safety Precautions: Patient was assessed for positional comfort and pressure points before starting the procedure. Time-out: I initiated and conducted the "Time-out" before starting the procedure, as per protocol. The patient was asked to participate by confirming the accuracy of the "Time Out" information. Verification of the correct person, site, and procedure were performed  and confirmed by me, the nursing staff, and the patient. "Time-out" conducted as per Joint Commission's Universal  Protocol (UP.01.01.01). Time: 0914  Description of Procedure:       Position: Prone with head of the table was raised to facilitate breathing. Target Area: The interlaminar space, initially targeting the lower laminar border of the superior vertebral body. Approach: Paramedial approach. Area Prepped: Entire Posterior Lumbar Region Prepping solution: ChloraPrep (2% chlorhexidine gluconate and 70% isopropyl alcohol) Safety Precautions: Aspiration looking for blood return was conducted prior to all injections. At no point did we inject any substances, as a needle was being advanced. No attempts were made at seeking any paresthesias. Safe injection practices and needle disposal techniques used. Medications properly checked for expiration dates. SDV (single dose vial) medications used. Description of the Procedure: Protocol guidelines were followed. The procedure needle was introduced through the skin, ipsilateral to the reported pain, and advanced to the target area. Bone was contacted and the needle walked caudad, until the lamina was cleared. The epidural space was identified using "loss-of-resistance technique" with 2-3 ml of PF-NaCl (0.9% NSS), in a 5cc LOR glass syringe. Vitals:   01/20/18 0917 01/20/18 0921 01/20/18 0930 01/20/18 0940  BP: (!) 115/59 115/61 124/69 111/61  Pulse:      Resp: 17 14 15 13   Temp:      TempSrc:      SpO2: 96% 96% 95% 95%  Weight:      Height:        Start Time: 0914 hrs. End Time: 0920(L1-2 right @4 .5 cm from skin) hrs. Materials:  Needle(s) Type: Epidural needle Gauge: 17G Length: 3.5-in Medication(s): Please see orders for medications and dosing details.  Imaging Guidance (Spinal):  Type of Imaging Technique: Fluoroscopy Guidance (Spinal) Indication(s): Assistance in needle guidance and placement for procedures requiring needle placement in  or near specific anatomical locations not easily accessible without such assistance. Exposure Time: Please see nurses notes. Contrast: Before injecting any contrast, we confirmed that the patient did not have an allergy to iodine, shellfish, or radiological contrast. Once satisfactory needle placement was completed at the desired level, radiological contrast was injected. Contrast injected under live fluoroscopy. No contrast complications. See chart for type and volume of contrast used. Fluoroscopic Guidance: I was personally present during the use of fluoroscopy. "Tunnel Vision Technique" used to obtain the best possible view of the target area. Parallax error corrected before commencing the procedure. "Direction-depth-direction" technique used to introduce the needle under continuous pulsed fluoroscopy. Once target was reached, antero-posterior, oblique, and lateral fluoroscopic projection used confirm needle placement in all planes. Images permanently stored in EMR. Interpretation: I personally interpreted the imaging intraoperatively. Adequate needle placement confirmed in multiple planes. Appropriate spread of contrast into desired area was observed. No evidence of afferent or efferent intravascular uptake. No intrathecal or subarachnoid spread observed. Permanent images saved into the patient's record.  Antibiotic Prophylaxis:   Anti-infectives (From admission, onward)   None     Indication(s): None identified  Post-operative Assessment:  Post-procedure Vital Signs:  Pulse Rate: 95 Temp: 98.1 F (36.7 C) Resp: 13 BP: 111/61 SpO2: 95 %  EBL: None  Complications: No immediate post-treatment complications observed by team, or reported by patient.  Note: The patient tolerated the entire procedure well. A repeat set of vitals were taken after the procedure and the patient was kept under observation following institutional policy, for this type of procedure. Post-procedural neurological  assessment was performed, showing return to baseline, prior to discharge. The patient was provided with post-procedure discharge instructions, including a section on how to identify potential problems. Should any  problems arise concerning this procedure, the patient was given instructions to immediately contact us, at any time, without hesitation. In any case, we plan to contact the patient by telephone for a follow-up status report regarding this interventional procedure.  Comments:  No additional relevant information.  Plan of Care    Imaging Orders     DG C-Arm 1-60 Min-No Report  Procedure Orders     Lumbar Epidural Injection  Medications ordered for procedure: Meds ordered this encounter  Medications  . iopamidol (ISOVUE-M) 41 % intrathecal injection 10 mL    Must be Myelogram-compatible. If not available, you may substitute with a water-soluble, non-ionic, hypoallergenic, myelogram-compatible radiological contrast medium.  Marland Kitchen lidocaine (XYLOCAINE) 2 % (with pres) injection 400 mg  . midazolam (VERSED) 5 MG/5ML injection 1-2 mg    Make sure Flumazenil is available in the pyxis when using this medication. If oversedation occurs, administer 0.2 mg IV over 15 sec. If after 45 sec no response, administer 0.2 mg again over 1 min; may repeat at 1 min intervals; not to exceed 4 doses (1 mg)  . fentaNYL (SUBLIMAZE) injection 25-50 mcg    Make sure Narcan is available in the pyxis when using this medication. In the event of respiratory depression (RR< 8/min): Titrate NARCAN (naloxone) in increments of 0.1 to 0.2 mg IV at 2-3 minute intervals, until desired degree of reversal.  . lactated ringers infusion 1,000 mL  . sodium chloride flush (NS) 0.9 % injection 2 mL  . ropivacaine (PF) 2 mg/mL (0.2%) (NAROPIN) injection 2 mL  . triamcinolone acetonide (KENALOG-40) injection 40 mg   Medications administered: We administered iopamidol, lidocaine, sodium chloride flush, ropivacaine (PF) 2 mg/mL  (0.2%), and triamcinolone acetonide.  See the medical record for exact dosing, route, and time of administration.  New Prescriptions   No medications on file   Disposition: Discharge home  Discharge Date & Time: 01/20/2018; 0929 hrs.   Physician-requested Follow-up: Return for post-procedure eval (2 wks), w/ Dr. Dossie Arbour.  Future Appointments  Date Time Provider [place]  01/29/2018  2:45 PM Vevelyn Francois, NP [facility] None  02/09/2018 10:15 AM Milinda Pointer, MD [facility] None   Primary Care Physician: Pleas Koch, NP Location: [facility] Out[facility] Management Facility Note by: Gaspar Cola, MD Date: 01/20/2018; Time: 9:43 AM  Disclaimer:  Medicine is not an Chief Strategy Officer. The only guarantee in medicine is that nothing is guaranteed. It is important to note that the decision to proceed with this intervention was based on the information collected from the patient. The Data and conclusions were drawn from the patient's questionnaire, the interview, and the physical examination. Because the information was provided in large part by the patient, it cannot be guaranteed that it has not been purposely or unconsciously manipulated. Every effort has been made to obtain as much relevant data as possible for this evaluation. It is important to note that the conclusions that lead to this procedure are derived in large part from the available data. Always take into account that the treatment will also be dependent on availability of resources and existing treatment guidelines, considered by other Pain Management Practitioners as being common knowledge and practice, at the time of the intervention. For Medico-Legal purposes, it is also important to point out that variation in procedural techniques and pharmacological choices are the acceptable norm. The indications, contraindications, technique, and results of the above procedure should only be interpreted and judged by a  Board-Certified Interventional Pain Specialist with extensive  familiarity and expertise in the same exact procedure and technique.

## 2018-01-19 NOTE — Progress Notes (Signed)
Patient's Name: Karen Edwards  MRN: [id number]  Referring Provider: Pleas Koch, NP  DOB: 1966/10/25  PCP: Pleas Koch, NP  DOS: 01/19/2018  Note by: Gaspar Cola, MD  Service setting: Ambulatory outpatient  Specialty: Interventional Pain Management  Location: [facility] (AMB) Pain Management Facility    Patient type: Established   Primary Reason(s) for Visit: Evaluation of chronic illnesses with exacerbation, or progression (Level of risk: moderate) CC: Neck Pain (bialteral, s/p surgery 2 years ago and then 1 year ago) and Back Pain (lower, right is worse. )  HPI  [name] is a 52 y.o. year old, female patient, who comes today for a follow-up evaluation. She has Chronic low back pain (Primary Area of Pain) (Bilateral) (R>L); Menorrhagia; Fatigue; DDD (degenerative disc disease), lumbar; DDD (degenerative disc disease), cervical; Chronic occipital neuralgia (Fifth Area of Pain) (Bilateral) (R>L); Lumbar facet syndrome (Bilateral) (L>R); DDD (degenerative disc disease), lumbosacral; Lumbar radicular pain (Right) (L4); Sacroiliac joint dysfunction (Bilateral); Depression; Rectal bleeding; Insomnia; Chronic constipation; Pharmacologic therapy; Hyperlipidemia, unspecified; Chronic neck pain (Fourth Area of Pain) (Bilateral) (R>L); GERD (gastroesophageal reflux disease); Numbness and tingling of upper extremity (Right); Chronic upper extremity pain (Bilateral) (R>L); Chronic sacroiliac joint pain (Bilateral) (L>R); Lumbar facet hypertrophy (Bilateral); L1-2 disc extrusion (Right); Cervical foraminal stenosis (C5-6) (Bilateral); Long term prescription opiate use; Opiate use; Disorder of skeletal system; Problems influencing health status; Chronic pain syndrome; Chronic cervical radicular pain (Right: C6/C7) (Left: C5/T1); Chronic lumbar radicular pain; Chronic hip pain (Tertiary Area of Pain) (Bilateral) (R>L); Chronic lower extremity pain (Secondary Area of Pain) (Bilateral) (R>L); Chronic  shoulder pain (Bilateral) (L>R); Chronic musculoskeletal pain; Lumbar spondylosis; Stress incontinence of urine; and Cervical facet syndrome on their problem list. [name] was last seen on 12/11/2017. Her primarily concern today is the Neck Pain (bialteral, s/p surgery 2 years ago and then 1 year ago) and Back Pain (lower, right is worse. )  Pain Assessment: Location: Left, Right Neck(back) Radiating: back pain is going down the right leg causing weakness and numbness and she is using cane for support.  neck pain is going into shoulders and down both arms.  Onset: More than a month ago Duration: Chronic pain Quality: Aching, Constant, Burning, Stabbing, Discomfort Severity: 3 /10 (self-reported pain score)  Note: Reported level is compatible with observation.                         When using our objective Pain Scale, levels between 6 and 10/10 are said to belong in an emergency room, as it progressively worsens from a 6/10, described as severely limiting, requiring emergency care not usually available at an outpatient pain management facility. At a 6/10 level, communication becomes difficult and requires great effort. Assistance to reach the emergency department may be required. Facial flushing and profuse sweating along with potentially dangerous increases in heart rate and blood pressure will be evident. Effect on ADL: limiting, has affected gait.  neck is popping often Timing: Constant Modifying factors: changing positions.  unable to stay in any position for very long  The patient's primary area of pain is that of the lower back.  However, diagnostic bilateral lumbar facets and SI joint injections have not provided the patient with significant relief of the pain.  Before her move on, today I will review those results myself as it has turned out that after each one of the diagnostic injections, the patient has had the postprocedure evaluations done by my  nurse practitioner, who is still  learning how to properly evaluate the results of these procedures.  If it turns out that in fact her recorded results are in agreement with mine, then the next possibility is that this low back pain may be coming from the upper lumbar region.  The patient does have MRI evidence of an L1-2 protrusion with cephalad migration.  Even though the MRI does not indicate any mechanical compression, there is always the possibility that there may be chemical irritation as the migration of this material is associated with a possible extrusion that would in turn be associated with a disc tear.  This would expose the normal material to the disc matrix, a substance known to be very irritating to neural tissue.  After having talked to the patient for an extensive amount of time, she admits that she was not providing loss with accurate information regarding the results of the test, because she was not really understanding what were asking for.  Now she feels that she can do a much better job at explaining the results and she indicates that she will also do a much better job at keeping track of those results.  Because of this, it is my opinion that we may need to repeat those lumbar facet and SI joint injections, but this time she will be better prepared to give me feedback on them, without including areas that we were not treating and also excluding discomfort from the procedure itself.  She now understands that she needs to limit the evaluation only to the treated area and to describe changes in her usual pain.  Further details on both, my assessment(s), as well as the proposed treatment plan, please see below.  Laboratory Chemistry  Inflammation Markers (CRP: Acute Phase) (ESR: Chronic Phase) Lab Results  Component Value Date   CRP 1.4 09/08/2017   ESRSEDRATE 11 09/08/2017                         Renal Function Markers Lab Results  Component Value Date   BUN 7 09/08/2017   CREATININE 0.84 09/08/2017   GFRAA 93  09/08/2017   GFRNONAA 81 09/08/2017                 Hepatic Function Markers Lab Results  Component Value Date   AST 18 09/08/2017   ALT 12 (L) 01/30/2017   ALBUMIN 4.7 09/08/2017   ALKPHOS 73 09/08/2017                 Electrolytes Lab Results  Component Value Date   NA 139 09/08/2017   K 4.4 09/08/2017   CL 99 09/08/2017   CALCIUM 9.8 09/08/2017   MG 2.1 09/08/2017                        Neuropathy Markers Lab Results  Component Value Date   VITAMINB12 >2000 (H) 09/08/2017                 Bone Pathology Markers Lab Results  Component Value Date   VD25OH 43.74 01/10/2016   25OHVITD1 51 09/08/2017   25OHVITD2 <1.0 09/08/2017   25OHVITD3 51 09/08/2017                         Coagulation Parameters Lab Results  Component Value Date   PLT 202.0 05/20/2017  Cardiovascular Markers Lab Results  Component Value Date   TROPONINI <0.03 01/30/2017   HGB 13.5 05/20/2017   HCT 40.5 05/20/2017                 Note: Lab results reviewed.  Recent Diagnostic Imaging Review  Cervical Imaging: Cervical MR wo contrast:  Results for orders placed during the hospital encounter of 01/14/18  MR CERVICAL SPINE WO CONTRAST   Narrative CLINICAL DATA:  Neck pain with burning and itching sensations for 2 years. History of prior cervical surgery.  EXAM: MRI CERVICAL SPINE WITHOUT CONTRAST  TECHNIQUE: Multiplanar, multisequence MR imaging of the cervical spine was performed. No intravenous contrast was administered.  COMPARISON:  Plain film cervical spine 01/01/2018. MRI cervical spine 10/16/2015.  FINDINGS: Alignment: Straightening of lordosis is noted. Trace retrolisthesis C4 on C5 is seen.  Vertebrae: As seen on the prior plain films, the patient is status post C5-7 discectomy and fusion. Artifact from hardware presumably for facet fusion is also seen at C4-5 and C5-6.  Cord: Normal signal throughout.  Posterior Fossa, vertebral arteries,  paraspinal tissues: Negative.  Disc levels:  C2-3: Negative.  C3-4: Negative.  C4-5: There is loss of disc space height and a mild bulge but the central canal and foramina are open. The appearance is unchanged.  C5-6: Status post discectomy and fusion since the prior MRI. The central canal and foramina are open.  C6-7: Status post discectomy and fusion since the prior MRI. The central canal and foramina are open.  C7-T1: Negative.  IMPRESSION: Status examination and facet post C5-7 ACDF joint effusion as described above since the prior. The central canal and foramina are open at all levels. No new abnormality.   Electronically Signed   By: Inge Rise M.D.   On: 01/14/2018 20:51    Cervical DG Bending/F/E views:  Results for orders placed during the hospital encounter of 01/01/18  DG Cervical Spine With Flex & Extend   Narrative CLINICAL DATA:  Chronic radicular cervical pain.  EXAM: CERVICAL SPINE COMPLETE WITH FLEXION AND EXTENSION VIEWS  COMPARISON:  CT scan of Mar 23, 2014.  FINDINGS: Status post surgical anterior fusion of C5-6 and C6-7. Mild degenerative disc disease is noted at C4-5 with anterior osteophyte formation. No prevertebral soft tissue swelling is noted. No acute fracture or spondylolisthesis is noted. No change in vertebral body alignment is noted on flexion or extension views. No significant neural foraminal stenosis is noted.  IMPRESSION: Status post surgical anterior fusion of C5-6 and C6-7. No acute abnormality seen in the cervical spine.   Electronically Signed   By: Marijo Conception, M.D.   On: 01/01/2018 16:42    Thoracic Imaging: Thoracic DG w/swimmers view:  Results for orders placed in visit on 05/05/17  [facility] Thoracic Spine W/Swimmers   Narrative CLINICAL DATA:  Chronic right chest wall pain.  No known injury.  EXAM: THORACIC SPINE - 3 VIEWS  COMPARISON:  Chest x-ray 01/30/2017  FINDINGS: There is no evidence of  thoracic spine fracture. Alignment is normal. No other significant bone abnormalities are identified.  IMPRESSION: Negative.   Electronically Signed   By: Rolm Baptise M.D.   On: 05/06/2017 08:12    Lumbosacral Imaging: Lumbar MR wo contrast:  Results for orders placed during the hospital encounter of 01/14/18  MR LUMBAR SPINE WO CONTRAST   Narrative CLINICAL DATA:  Chronic low back pain with burning and catching sensation. Bilateral leg pain, right worse than left. No recent injury.  EXAM: MRI LUMBAR SPINE WITHOUT CONTRAST  TECHNIQUE: Multiplanar, multisequence MR imaging of the lumbar spine was performed. No intravenous contrast was administered.  COMPARISON:  MRI lumbar spine 02/20/2016.  FINDINGS: Segmentation:  Standard.  Alignment:  Maintained.  Vertebrae: A few small hemangiomas are seen. No fracture or worrisome lesion.  Conus medullaris and cauda equina: Conus extends to the T12-L1 level. Conus and cauda equina appear normal.  Paraspinal and other soft tissues: Negative.  Disc levels:  T11-12 is imaged in the sagittal plane only and negative.  T12-L1: Negative.  L1-2: Shallow central protrusion with cephalad extension is unchanged. The central canal and foramina are widely patent.  L2-3: Minimal disc bulge and mild facet degenerative disease without stenosis.  L3-4: Minimal disc bulge and mild facet degenerative change. No stenosis.  L4-5: Minimal disc bulge and mild facet arthropathy without stenosis.  L5-S1: Negative.  IMPRESSION: No change in the appearance of the lumbar spine. No new abnormality since the prior exam.  Mild degenerative disease without stenosis most notable at L1-2 where there is a shallow central disc protrusion with cephalad extension.   Electronically Signed   By: Inge Rise M.D.   On: 01/14/2018 20:44    Lumbar MR w/wo contrast:  Results for orders placed during the hospital encounter of 12/03/12  MR  Lumbar Spine W Wo Contrast   Narrative *RADIOLOGY REPORT*  Clinical Data: Chronic low back pain with left sided radicular symptoms.  No reported relevant prior surgery.  MRI LUMBAR SPINE WITHOUT AND WITH CONTRAST  Technique:  Multiplanar and multiecho pulse sequences of the lumbar spine were obtained without and with intravenous contrast.  Contrast: 42m MULTIHANCE GADOBENATE DIMEGLUMINE 529 MG/ML IV SOLN  Comparison: Lumbar spine radiographs 10/22/2012.  Findings: Radiographs demonstrate five lumbar type vertebral bodies.  The alignment is normal.  There is no evidence of fracture or pars defect.  Scattered tiny hemangiomas are noted.  The conus medullaris extends to the T12-L1 level and appears normal. There are no paraspinal abnormalities. Sagittal images demonstrate probable retroversion of the uterus.  There is a small subserosal uterine fibroid as well as central low density in the uterus which may reflect a larger fibroid.  This is only partially imaged on the current examination.  L1-L2:  Small right paracentral disc protrusion.  No spinal stenosis or nerve root encroachment.  L2-L3:  Mild disc bulging.  No spinal stenosis or nerve root encroachment.  L3-L4:  Disc height and hydration are maintained.  There is mild facet and ligamentous hypertrophy.  No spinal stenosis or nerve root encroachment.  L4-L5:  Mild disc bulging with mild facet and ligamentous hypertrophy.  No spinal stenosis or nerve root encroachment.  L5-S1:  Disc height and hydration are maintained.  No spinal stenosis or nerve root encroachment.  IMPRESSION:  1.  Small right paracentral disc protrusion at L1-L2. 2.  Mild disc bulging and facet disease from L2-L3 through L4-L5. 3.  No significant spinal stenosis or nerve root encroachment. 4.  Suspected uterine fibroids, only partially imaged on the current examination.   Original Report Authenticated By: WRichardean Sale M.D.    Lumbar DG  (Complete) 4+V:  Results for orders placed during the hospital encounter of 10/22/12  DG Lumbar Spine Complete   Narrative *RADIOLOGY REPORT*  Clinical Data: Back pain.  LUMBAR SPINE - COMPLETE 4+ VIEW  Comparison: None  Findings: The lateral film demonstrates normal alignment. Vertebral bodies and disc spaces are maintained.  No acute bony findings.  Normal alignment of  the facet joints and no pars defects.  The visualized bony pelvis in intact.  IMPRESSION: Normal alignment and no acute bony findings or degenerative changes.   Original Report Authenticated By: Marijo Sanes, M.D.    Lumbar DG Bending views:  Results for orders placed during the hospital encounter of 09/08/17  DG Lumbar Spine Complete W/Bend   Narrative CLINICAL DATA:  Chronic pain, pain clinic referral.  EXAM: LUMBAR SPINE - COMPLETE WITH BENDING VIEWS  COMPARISON:  Lumbar spine MRI of February 20, 2016  FINDINGS: The lumbar vertebral bodies are preserved in height. The pedicles and transverse processes are intact. The disc space heights are well maintained. There is no spondylolisthesis. There is no significant facet joint hypertrophy. There is no evidence of ligamentous instability as the patient moves from the neutral to the flexed and to the extended positions. The observed portions of the sacrum are normal.  IMPRESSION: There is no acute or significant chronic bony abnormality of the lumbar spine.   Electronically Signed   By: David  Martinique M.D.   On: 09/08/2017 15:40    Sacroiliac Joint Imaging: Sacroiliac Joint DG:  Results for orders placed during the hospital encounter of 09/08/17  DG Si Joints   Narrative CLINICAL DATA:  Chronic pain in multiple areas of the body. No injury.  EXAM: BILATERAL SACROILIAC JOINTS - 3+ VIEW  COMPARISON:  None.  FINDINGS: The sacroiliac joint spaces are maintained and there is no evidence of arthropathy. No other bone abnormalities are  seen.  IMPRESSION: Negative.   Electronically Signed   By: Staci Righter M.D.   On: 09/08/2017 15:42    Hip Imaging: Hip-R DG 2-3 views:  Results for orders placed during the hospital encounter of 09/08/17  DG HIP UNILAT W OR W/O PELVIS 2-3 VIEWS RIGHT   Narrative CLINICAL DATA:  Chronic pelvic and hip pain. No report of injury. Pain center referral.  EXAM: DG HIP (WITH OR WITHOUT PELVIS) 2-3V RIGHT; DG HIP (WITH OR WITHOUT PELVIS) 2-3V LEFT  COMPARISON:  None in PACs  FINDINGS: The bony pelvis is subjectively adequately mineralized. There is no lytic or blastic lesion. The sacrum and SI joints appear normal.  AP and lateral views of both hips reveal preservation of the joint spaces. The articular surfaces of the femoral heads and acetabuli remains smoothly rounded. The femoral necks, intertrochanteric, and subtrochanteric regions are normal. The overlying soft tissues are normal.  IMPRESSION: There is no acute or significant chronic bony abnormality of the pelvis or hips.   Electronically Signed   By: David  Martinique M.D.   On: 09/08/2017 15:42    Hip-L DG 2-3 views:  Results for orders placed during the hospital encounter of 09/08/17  DG HIP UNILAT W OR W/O PELVIS 2-3 VIEWS LEFT   Narrative CLINICAL DATA:  Chronic pelvic and hip pain. No report of injury. Pain center referral.  EXAM: DG HIP (WITH OR WITHOUT PELVIS) 2-3V RIGHT; DG HIP (WITH OR WITHOUT PELVIS) 2-3V LEFT  COMPARISON:  None in PACs  FINDINGS: The bony pelvis is subjectively adequately mineralized. There is no lytic or blastic lesion. The sacrum and SI joints appear normal.  AP and lateral views of both hips reveal preservation of the joint spaces. The articular surfaces of the femoral heads and acetabuli remains smoothly rounded. The femoral necks, intertrochanteric, and subtrochanteric regions are normal. The overlying soft tissues are normal.  IMPRESSION: There is no acute or  significant chronic bony abnormality of the pelvis or hips.  Electronically Signed   By: David  Martinique M.D.   On: 09/08/2017 15:42    Complexity Note: Imaging results reviewed. Results shared with Ms. Tye Savoy, using [employer]. Today I covered in detail the results of her lumbar and cervical MRIs and how they may be related to her pain.  She indicated having understood clearly.                  Meds   Current Outpatient Medications:  .  atorvastatin (LIPITOR) 10 MG tablet, Take 1 tablet by mouth every evening for cholesterol., Disp: 90 tablet, Rfl: 1 .  Cyanocobalamin (B-12 PO), Take 2,500 mcg by mouth every other day., Disp: , Rfl:  .  FLUoxetine (PROZAC) 20 MG capsule, Take 3 capsules (60 mg total) by mouth daily., Disp: 270 capsule, Rfl: 3 .  HYDROcodone-acetaminophen (NORCO/VICODIN) 5-325 MG tablet, Take 1 tablet by mouth 2 (two) times daily., Disp: 60 tablet, Rfl: 0 .  methocarbamol (ROBAXIN) 500 MG tablet, Take 1 tablet (500 mg total) by mouth every 8 (eight) hours as needed for muscle spasms., Disp: 90 tablet, Rfl: 0 .  oxybutynin (DITROPAN-XL) 5 MG 24 hr tablet, Take 1 tablet (5 mg total) by mouth at bedtime., Disp: 90 tablet, Rfl: 0 .  Potassium 99 MG TABS, Take 1 tablet by mouth daily., Disp: , Rfl:  .  traZODone (DESYREL) 100 MG tablet, Take 3 tablets (300 mg total) by mouth at bedtime., Disp: 270 tablet, Rfl: 3 .  linaclotide (LINZESS) 145 MCG CAPS capsule, Take 1 capsule (145 mcg total) by mouth daily before breakfast., Disp: 90 capsule, Rfl: 0  ROS  Constitutional: Denies any fever or chills Gastrointestinal: No reported hemesis, hematochezia, vomiting, or acute GI distress Musculoskeletal: Denies any acute onset joint swelling, redness, loss of ROM, or weakness Neurological: No reported episodes of acute onset apraxia, aphasia, dysarthria, agnosia, amnesia, paralysis, loss of coordination, or loss of consciousness  Allergies  Ms. Burry is allergic to no known  allergies.  [place]  Drug: Ms. Stockburger  reports that she does not use drugs. Alcohol:  reports that she drinks about 0.6 oz of alcohol per week. Tobacco:  reports that she has been smoking cigarettes.  She has a 3.70 pack-year smoking history. She has never used smokeless tobacco. Medical:  has a past medical history of Anxiety, Chronic back pain, Degenerative joint disease, Depression, Hypotension, IBS (irritable bowel syndrome) (05/2015), and MVP (mitral valve prolapse). Surgical: Ms. Gammage  has a past surgical history that includes Cesarean section; Ankle surgery (Right, 03/02/2014); Hemorrhoid surgery (N/A, 06/16/2015); Cervical spine surgery (02/21/2016); Cesarean section with bilateral tubal ligation (1990); Colonoscopy (04/19/2015); and Hysteroscopy w/D&C (12/24/2013). Family: family history includes Arthritis in her mother; Congestive Heart Failure in her maternal grandfather; Depression in her mother; Diabetes in her maternal aunt; Diabetes Mellitus I in her maternal grandmother; Gout in her maternal aunt; Hearing loss in her father; Heart attack in her maternal grandfather; Hyperlipidemia in her father and mother; Hypertension in her father and mother; Hypothyroidism in her mother; Kidney disease in her mother; Prostate cancer in her paternal grandfather; Stroke in her maternal grandmother; Thyroid disease in her maternal aunt; Vision loss in her mother.  Constitutional Exam  General appearance: Well nourished, well developed, and well hydrated. In no apparent acute distress Vitals:   01/19/18 1116  BP: 123/75  Pulse: 89  Temp: 98.4 F (36.9 C)  TempSrc: Oral  SpO2: 97%  Weight: 140 lb (63.5 kg)  Height: 5' 1"  (1.549 m)  BMI Assessment: Estimated body mass index is 26.45 kg/m as calculated from the following:   Height as of this encounter: 5' 1"  (1.549 m).   Weight as of this encounter: 140 lb (63.5 kg).  BMI interpretation table: BMI level Category Range association with  higher incidence of chronic pain  <18 kg/m2 Underweight   18.5-24.9 kg/m2 Ideal body weight   25-29.9 kg/m2 Overweight Increased incidence by 20%  30-34.9 kg/m2 Obese (Class I) Increased incidence by 68%  35-39.9 kg/m2 Severe obesity (Class II) Increased incidence by 136%  >40 kg/m2 Extreme obesity (Class III) Increased incidence by 254%   BMI Readings from Last 4 Encounters:  01/19/18 26.45 kg/m  01/13/18 27.87 kg/m  01/01/18 26.45 kg/m  12/16/17 28.47 kg/m   Wt Readings from Last 4 Encounters:  01/19/18 140 lb (63.5 kg)  01/13/18 147 lb 8 oz (66.9 kg)  01/01/18 140 lb (63.5 kg)  12/16/17 145 lb 12.8 oz (66.1 kg)  Psych/Mental status: Alert, oriented x 3 (person, place, & time)       Eyes: PERLA Respiratory: No evidence of acute respiratory distress  Cervical Spine Area Exam  Skin & Axial Inspection: Well healed scar from previous spine surgery detected Alignment: Symmetrical Functional ROM: Minimal ROM      Stability: No instability detected Muscle Tone/Strength: Functionally intact. No obvious neuro-muscular anomalies detected. Sensory (Neurological): Movement-associated pain Palpation: Complains of area being tender to palpation              Upper Extremity (UE) Exam    Side: Right upper extremity  Side: Left upper extremity  Skin & Extremity Inspection: Skin color, temperature, and hair growth are WNL. No peripheral edema or cyanosis. No masses, redness, swelling, asymmetry, or associated skin lesions. No contractures.  Skin & Extremity Inspection: Skin color, temperature, and hair growth are WNL. No peripheral edema or cyanosis. No masses, redness, swelling, asymmetry, or associated skin lesions. No contractures.  Functional ROM: Unrestricted ROM          Functional ROM: Unrestricted ROM          Muscle Tone/Strength: Functionally intact. No obvious neuro-muscular anomalies detected.  Muscle Tone/Strength: Functionally intact. No obvious neuro-muscular anomalies  detected.  Sensory (Neurological): Unimpaired          Sensory (Neurological): Unimpaired          Palpation: No palpable anomalies              Palpation: No palpable anomalies              Specialized Test(s): Deferred         Specialized Test(s): Deferred          Thoracic Spine Area Exam  Skin & Axial Inspection: No masses, redness, or swelling Alignment: Symmetrical Functional ROM: Unrestricted ROM Stability: No instability detected Muscle Tone/Strength: Functionally intact. No obvious neuro-muscular anomalies detected. Sensory (Neurological): Unimpaired Muscle strength & Tone: No palpable anomalies  Lumbar Spine Area Exam  Skin & Axial Inspection: No masses, redness, or swelling Alignment: Symmetrical Functional ROM: Decreased ROM      Stability: No instability detected Muscle Tone/Strength: Functionally intact. No obvious neuro-muscular anomalies detected. Sensory (Neurological): Movement-associated pain Palpation: Complains of area being tender to palpation       Provocative Tests: Lumbar Hyperextension and rotation test: Positive bilaterally for facet joint pain. Lumbar Lateral bending test: Positive ipsilateral radicular pain, on the right. Positive for right-sided foraminal stenosis. Patrick's Maneuver: Positive for bilateral S-I arthralgia  Gait & Posture Assessment  Ambulation: Patient ambulates using a cane Gait: Very limited, using assistive device to ambulate Posture: Antalgic   Lower Extremity Exam    Side: Right lower extremity  Side: Left lower extremity  Skin & Extremity Inspection: Skin color, temperature, and hair growth are WNL. No peripheral edema or cyanosis. No masses, redness, swelling, asymmetry, or associated skin lesions. No contractures.  Skin & Extremity Inspection: Skin color, temperature, and hair growth are WNL. No peripheral edema or cyanosis. No masses, redness, swelling, asymmetry, or associated skin lesions. No contractures.   Functional ROM: Unrestricted ROM          Functional ROM: Unrestricted ROM          Muscle Tone/Strength: Functionally intact. No obvious neuro-muscular anomalies detected.  Muscle Tone/Strength: Functionally intact. No obvious neuro-muscular anomalies detected.  Sensory (Neurological): Unimpaired  Sensory (Neurological): Unimpaired  Palpation: No palpable anomalies  Palpation: No palpable anomalies   Assessment  Primary Diagnosis & Pertinent Problem List: The primary encounter diagnosis was Chronic low back pain (Primary Area of Pain) (Bilateral) (R>L). 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  Status Diagnosis  Persistent Stable Persistent 1. Chronic low back pain (Primary Area of Pain) (Bilateral) (R>L)   2. 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 Chronic lower extremity pain (Secondary Area of Pain) (Bilateral) (R>L)   4. Chronic hip pain (Tertiary Area of Pain) (Bilateral) (R>L)   5. DDD (degenerative disc disease), lumbar   6. Lumbar facet syndrome (Bilateral) (L>R)   7. Chronic sacroiliac joint pain (Bilateral) (L>R)   8. Chronic pain syndrome   9. Disorder of skeletal system   10. Cervical facet syndrome     Problems updated and reviewed during this visit: Problem  Cervical Facet Syndrome  Lumbar Spondylosis  Stress Incontinence of Urine   Time Note: Greater than 50% of the 40 minute(s) of face-to-face time spent with Ms. Higley, was spent in counseling/coordination of care regarding: Ms. Cocco primary cause of pain, the significance of each one oth the test(s) anomalies and it's corresponding  characteristic pain pattern(s), the treatment plan, the risks and possible complications of proposed treatment, the results, interpretation and significance of  her recent diagnostic interventional treatment(s), realistic expectations, the importance of providing [place] with accurate post-procedure information and the need to collect and read the AVS material.  Plan of Care  Pharmacotherapy (Medications Ordered): No orders of the defined types were placed in this encounter.  Medications administered today: Mimi L. Hotard had no medications administered during this visit.   Procedure Orders     Lumbar Epidural Injection Lab Orders  No laboratory test(s) ordered today   Imaging Orders  No imaging studies ordered today   Referral Orders  No referral(s) requested today    Interventional management options: Planned, scheduled, and/or pending:   Diagnostic right-sided L1-2 interlaminar LESI #1 under fluoroscopic guidance and IV sedation   Considering:   Diagnostic bilateral lumbar facet block Possible bilateral lumbar facet RFA Diagnostic bilateral sacroiliac joint block Possible bilateral sacroiliac joint RFA Diagnostic bilateral intra-articular hip joint injection Diagnostic bilateral femoral nerve block + obturator nerve block Possible bilateral femoral nerve + obturator nerve RFA Diagnostic right-sided L1-2lumbar epidural  steroid injection Diagnostic right-sided L1-2transforaminal epidural steroid injection Diagnostic bilateralL4-5lumbar transforaminal epidural steroid injection Diagnostic bilateral cervical facet block Possible bilateral cervical facet RFA Diagnostic right-sided cervical epidural steroid injection Diagnostic bilateral greater occipital nerve block Possible bilateral occipital nerve RFA Diagnostic bilateral intra-articular shoulder joint injection Diagnostic bilateral suprascapular nerve block Possible bilateral suprascapular nerve RFA    Palliative PRN treatment(s):   None at this time   Provider-requested follow-up: Return for Procedure (w/ sedation): (R) L1-2 LESI #1.  Future Appointments  Date Time Provider [place]  01/20/2018  8:30 AM Milinda Pointer, MD [facility] None  01/29/2018  2:45 PM Vevelyn Francois, NP [facility] None   Primary Care Physician: Pleas Koch, NP Location: [facility] Note by: Gaspar Cola, MD Date: 01/19/2018; Time: 2:09 PM

## 2018-01-20 ENCOUNTER — Ambulatory Visit (HOSPITAL_BASED_OUTPATIENT_CLINIC_OR_DEPARTMENT_OTHER): Payer: PPO | Admitting: Pain Medicine

## 2018-01-20 ENCOUNTER — Other Ambulatory Visit: Payer: Self-pay

## 2018-01-20 ENCOUNTER — Encounter: Payer: Self-pay | Admitting: Pain Medicine

## 2018-01-20 ENCOUNTER — Ambulatory Visit
Admission: RE | Admit: 2018-01-20 | Discharge: 2018-01-20 | Disposition: A | Discharge status: Home / Self Care | Payer: PPO | Source: Ambulatory Visit | Attending: Pain Medicine | Admitting: Pain Medicine

## 2018-01-20 VITALS — BP 122/72 | HR 95 | Temp 98.1°F | Resp 15 | Ht 60.0 in | Wt 140.0 lb

## 2018-01-20 DIAGNOSIS — M5126 Other intervertebral disc displacement, lumbar region: Secondary | ICD-10-CM | POA: Insufficient documentation

## 2018-01-20 DIAGNOSIS — M5136 Other intervertebral disc degeneration, lumbar region: Secondary | ICD-10-CM | POA: Insufficient documentation

## 2018-01-20 DIAGNOSIS — M5441 Lumbago with sciatica, right side: Secondary | ICD-10-CM | POA: Diagnosis not present

## 2018-01-20 DIAGNOSIS — M79604 Pain in right leg: Secondary | ICD-10-CM | POA: Insufficient documentation

## 2018-01-20 DIAGNOSIS — M5442 Lumbago with sciatica, left side: Secondary | ICD-10-CM

## 2018-01-20 DIAGNOSIS — M79605 Pain in left leg: Secondary | ICD-10-CM

## 2018-01-20 DIAGNOSIS — G8929 Other chronic pain: Secondary | ICD-10-CM

## 2018-01-20 DIAGNOSIS — Z79899 Other long term (current) drug therapy: Secondary | ICD-10-CM | POA: Insufficient documentation

## 2018-01-20 MED ORDER — MIDAZOLAM HCL 5 MG/5ML IJ SOLN
1.0000 mg | INTRAMUSCULAR | Status: DC | PRN
  Administered 2018-01-20: 2 mg via INTRAVENOUS
  Filled 2018-01-20: qty 5

## 2018-01-20 MED ORDER — LACTATED RINGERS IV SOLN: 1000.0000 mL | Freq: Once | INTRAVENOUS | Status: DC

## 2018-01-20 MED ORDER — IOPAMIDOL (ISOVUE-M 200) INJECTION 41%
10.0000 mL | Freq: Once | INTRAMUSCULAR | Status: AC
  Administered 2018-01-20: 10 mL via EPIDURAL
  Filled 2018-01-20: qty 10

## 2018-01-20 MED ORDER — TRIAMCINOLONE ACETONIDE 40 MG/ML IJ SUSP
40.0000 mg | Freq: Once | INTRAMUSCULAR | Status: AC
  Administered 2018-01-20: 40 mg
  Filled 2018-01-20: qty 1

## 2018-01-20 MED ORDER — FENTANYL CITRATE (PF) 100 MCG/2ML IJ SOLN
25.0000 ug | INTRAMUSCULAR | Status: DC | PRN
  Administered 2018-01-20: 50 ug via INTRAVENOUS
  Filled 2018-01-20: qty 2

## 2018-01-20 MED ORDER — SODIUM CHLORIDE 0.9% FLUSH
2.0000 mL | Freq: Once | INTRAVENOUS | Status: AC
  Administered 2018-01-20: 10 mL

## 2018-01-20 MED ORDER — LIDOCAINE HCL 2 % IJ SOLN
20.0000 mL | Freq: Once | INTRAMUSCULAR | Status: AC
  Administered 2018-01-20: 400 mg
  Filled 2018-01-20: qty 400

## 2018-01-20 MED ORDER — ROPIVACAINE HCL 2 MG/ML IJ SOLN
2.0000 mL | Freq: Once | INTRAMUSCULAR | Status: AC
  Administered 2018-01-20: 10 mL via EPIDURAL
  Filled 2018-01-20: qty 10

## 2018-01-20 NOTE — Progress Notes (Signed)
Safety precautions to be maintained throughout the outpatient stay will include: orient to surroundings, keep bed in low position, maintain call bell within reach at all times, provide assistance with transfer out of bed and ambulation.   Upon discharge patient states that her dad is in a [place] car in parking lot. Unable to find car and pt driver does not have a phone. She stated that he wouldn't drive to the front to get her until 11:00. Anne Ng is sitting with the patient, although she states that she would be fine. Patient not left unattended until ride is available. Patient DC home via wheelchair, accomp by by father at 74

## 2018-01-20 NOTE — Patient Instructions (Signed)

## 2018-01-21 ENCOUNTER — Telehealth: Payer: Self-pay

## 2018-01-21 ENCOUNTER — Encounter: Payer: Self-pay | Admitting: Primary Care

## 2018-01-21 NOTE — Telephone Encounter (Signed)
Post procedure phone call.  States she is doing well.  

## 2018-01-29 ENCOUNTER — Ambulatory Visit: Payer: PPO | Attending: Nurse Practitioner | Admitting: Nurse Practitioner

## 2018-01-29 ENCOUNTER — Encounter: Payer: Self-pay | Admitting: Nurse Practitioner

## 2018-01-29 ENCOUNTER — Other Ambulatory Visit: Payer: Self-pay

## 2018-01-29 VITALS — BP 98/75 | HR 71 | Temp 98.6°F | Resp 18 | Ht 60.0 in | Wt 140.0 lb

## 2018-01-29 DIAGNOSIS — M533 Sacrococcygeal disorders, not elsewhere classified: Secondary | ICD-10-CM | POA: Insufficient documentation

## 2018-01-29 DIAGNOSIS — G894 Chronic pain syndrome: Secondary | ICD-10-CM | POA: Insufficient documentation

## 2018-01-29 DIAGNOSIS — Z79891 Long term (current) use of opiate analgesic: Secondary | ICD-10-CM | POA: Diagnosis not present

## 2018-01-29 DIAGNOSIS — M5116 Intervertebral disc disorders with radiculopathy, lumbar region: Secondary | ICD-10-CM | POA: Diagnosis not present

## 2018-01-29 DIAGNOSIS — Z79899 Other long term (current) drug therapy: Secondary | ICD-10-CM | POA: Insufficient documentation

## 2018-01-29 DIAGNOSIS — M7918 Myalgia, other site: Secondary | ICD-10-CM | POA: Insufficient documentation

## 2018-01-29 DIAGNOSIS — K5909 Other constipation: Secondary | ICD-10-CM

## 2018-01-29 DIAGNOSIS — M79602 Pain in left arm: Secondary | ICD-10-CM

## 2018-01-29 DIAGNOSIS — M79601 Pain in right arm: Secondary | ICD-10-CM

## 2018-01-29 DIAGNOSIS — K581 Irritable bowel syndrome with constipation: Secondary | ICD-10-CM | POA: Diagnosis not present

## 2018-01-29 DIAGNOSIS — M47816 Spondylosis without myelopathy or radiculopathy, lumbar region: Secondary | ICD-10-CM

## 2018-01-29 DIAGNOSIS — E785 Hyperlipidemia, unspecified: Secondary | ICD-10-CM | POA: Insufficient documentation

## 2018-01-29 DIAGNOSIS — M4802 Spinal stenosis, cervical region: Secondary | ICD-10-CM | POA: Diagnosis not present

## 2018-01-29 DIAGNOSIS — G8929 Other chronic pain: Secondary | ICD-10-CM

## 2018-01-29 DIAGNOSIS — Z5181 Encounter for therapeutic drug level monitoring: Secondary | ICD-10-CM | POA: Diagnosis not present

## 2018-01-29 DIAGNOSIS — M47812 Spondylosis without myelopathy or radiculopathy, cervical region: Secondary | ICD-10-CM

## 2018-01-29 DIAGNOSIS — M4726 Other spondylosis with radiculopathy, lumbar region: Secondary | ICD-10-CM | POA: Insufficient documentation

## 2018-01-29 DIAGNOSIS — F17210 Nicotine dependence, cigarettes, uncomplicated: Secondary | ICD-10-CM | POA: Diagnosis not present

## 2018-01-29 MED ORDER — NALOXEGOL OXALATE 25 MG PO TABS: 25.0000 mg | ORAL_TABLET | Freq: Every day | ORAL | 0 refills | Status: DC

## 2018-01-29 MED ORDER — HYDROCODONE-ACETAMINOPHEN 5-325 MG PO TABS: 1.0000 | ORAL_TABLET | Freq: Two times a day (BID) | ORAL | 0 refills | Status: DC

## 2018-01-29 NOTE — Progress Notes (Signed)
Nursing Pain Medication Assessment:  Safety precautions to be maintained throughout the outpatient stay will include: orient to surroundings, keep bed in low position, maintain call bell within reach at all times, provide assistance with transfer out of bed and ambulation.  Medication Inspection Compliance: Pill count conducted under aseptic conditions, in front of the patient. Neither the pills nor the bottle was removed from the patient's sight at any time. Once count was completed pills were immediately returned to the patient in their original bottle.  Medication: Hydrocodone/APAP Pill/Patch Count: 11 of 60 pills remain Pill/Patch Appearance: Markings consistent with prescribed medication Bottle Appearance: Standard pharmacy container. Clearly labeled. Filled Date: 03 / 20 / 2019 Last Medication intake:  Yesterday

## 2018-01-29 NOTE — Progress Notes (Signed)
Patient's Name: Karen Edwards  MRN: [id number]  Referring Provider: Pleas Koch, NP  DOB: [birth year]  PCP: Pleas Koch, NP  DOS: 01/29/2018  Note by: Vevelyn Francois NP  Service setting: Ambulatory outpatient  Specialty: Interventional Pain Management  Location: [facility] (AMB) Pain Management Facility    Patient type: Established    Primary Reason(s) for Visit: Encounter for prescription drug management. (Level of risk: moderate)  CC: Neck Pain and Back Pain (low and right is greater)  HPI  Karen Edwards is a 52 y.o. year old, female patient, who comes today for a medication management evaluation. She has Chronic low back pain (Primary Area of Pain) (Bilateral) (R>L); Menorrhagia; Fatigue; DDD (degenerative disc disease), lumbar; DDD (degenerative disc disease), cervical; Chronic occipital neuralgia (Fifth Area of Pain) (Bilateral) (R>L); Lumbar facet syndrome (Bilateral) (L>R); DDD (degenerative disc disease), lumbosacral; Lumbar radicular pain (Right) (L4); Sacroiliac joint dysfunction (Bilateral); Depression; Rectal bleeding; Insomnia; Chronic constipation; Pharmacologic therapy; Hyperlipidemia, unspecified; Chronic neck pain (Fourth Area of Pain) (Bilateral) (R>L); GERD (gastroesophageal reflux disease); Numbness and tingling of upper extremity (Right); Chronic upper extremity pain (Bilateral) (R>L); Chronic sacroiliac joint pain (Bilateral) (L>R); Lumbar facet hypertrophy (Bilateral); L1-2 disc extrusion (Right); Cervical foraminal stenosis (C5-6) (Bilateral); Long term prescription opiate use; Opiate use; Disorder of skeletal system; Problems influencing health status; Chronic pain syndrome; Chronic cervical radicular pain (Right: C6/C7) (Left: C5/T1); Chronic lumbar radicular pain; Chronic hip pain (Tertiary Area of Pain) (Bilateral) (R>L); Chronic lower extremity pain (Secondary Area of Pain) (Bilateral) (R>L); Chronic shoulder pain (Bilateral) (L>R); Chronic musculoskeletal pain; Lumbar  spondylosis; Stress incontinence of urine; Cervical facet syndrome; and Myofascial pain on their problem list. Her primarily concern today is the Neck Pain and Back Pain (low and right is greater)  Pain Assessment: Location:   Neck Onset: More than a month ago Duration: Chronic pain Quality: Aching, Burning, Stabbing(popping) Severity: 4 /10 (self-reported pain score)  Note: Reported level is compatible with observation.                          Timing: Constant Modifying factors: changing positions, medications, hot baths  Karen Edwards was last scheduled for an appointment on 01/01/2018 for medication management. During today's appointment we reviewed Karen Edwards's chronic pain status, as well as her outpatient medication regimen. She states that she is having increased neck pain. She is having burning and aching that his worse with turning. She is also having popping. She has pain that is going to her deltoid. She admits that this is on the right. She has occasional tingling in her arms. She admits that she does have weakness. She denies any EMG  The patient  reports that she does not use drugs. Her body mass index is 27.34 kg/m.  Further details on both, my assessment(s), as well as the proposed treatment plan, please see below.  Controlled Substance Pharmacotherapy Assessment REMS (Risk Evaluation and Mitigation Strategy)  Analgesic:Hydrocodone/APAP 5/325 1 tablet p.o. twice daily MME/day:27m/day     SHart Rochester RN  01/29/2018  1:39 PM  Sign at close encounter Nursing Pain Medication Assessment:  Safety precautions to be maintained throughout the outpatient stay will include: orient to surroundings, keep bed in low position, maintain call bell within reach at all times, provide assistance with transfer out of bed and ambulation.  Medication Inspection Compliance: Pill count conducted under aseptic conditions, in front of the patient. Neither the pills nor the bottle was  removed  from the patient's sight at any time. Once count was completed pills were immediately returned to the patient in their original bottle.  Medication: Hydrocodone/APAP Pill/Patch Count: 11 of 60 pills remain Pill/Patch Appearance: Markings consistent with prescribed medication Bottle Appearance: Standard pharmacy container. Clearly labeled. Filled Date: 03 / 20 / 2019 Last Medication intake:  Yesterday   Pharmacokinetics: Liberation and absorption (onset of action): WNL Distribution (time to peak effect): WNL Metabolism and excretion (duration of action): WNL         Pharmacodynamics: Desired effects: Analgesia: Karen Edwards reports >50% benefit. Functional ability: Patient reports that medication allows her to accomplish basic ADLs Clinically meaningful improvement in function (CMIF): Sustained CMIF goals met Perceived effectiveness: Described as relatively effective, allowing for increase in activities of daily living (ADL) Undesirable effects: Side-effects or Adverse reactions: None reported Monitoring: [place] PMP: Online review of the past 2-monthperiod conducted. Compliant with practice rules and regulations Last UDS on record: Summary  Date Value Ref Range Status  09/08/2017 FINAL  Final    Comment:    ==================================================================== TOXASSURE COMP DRUG ANALYSIS,UR ==================================================================== Test                             Result       Flag       Units Drug Present   Zolpidem Acid                  PRESENT    Zolpidem acid is an expected metabolite of zolpidem.   Fluoxetine                     PRESENT   Norfluoxetine                  PRESENT    Norfluoxetine is an expected metabolite of fluoxetine.   Trazodone                      PRESENT   1,3 chlorophenyl piperazine    PRESENT    1,3-chlorophenyl piperazine is an expected metabolite of     trazodone. ==================================================================== Test                      Result    Flag   Units      Ref Range   Creatinine              56               mg/dL      >=20 ==================================================================== Declared Medications:  Medication list was not provided. ==================================================================== For clinical consultation, please call [phone]====    UDS interpretation: Compliant          Medication Assessment Form: Reviewed. Patient indicates being compliant with therapy Treatment compliance: Compliant Risk Assessment Profile: Aberrant behavior: See prior evaluations. None observed or detected today Comorbid factors increasing risk of overdose: See prior notes. No additional risks detected today Risk of substance use disorder (SUD): Low Opioid Risk Tool - 12/08/17 1205      Family History of Substance Abuse   Alcohol  Negative    Illegal Drugs  Negative    Rx Drugs  Negative      Personal History of Substance Abuse   Alcohol  Negative    Illegal Drugs  Negative    Rx Drugs  Negative      Age  Age between 42-45 years   No      History of Preadolescent Sexual Abuse   History of Preadolescent Sexual Abuse  Negative or Female      Psychological Disease   Psychological Disease  Negative    Depression  Positive      Total Score   Opioid Risk Tool Scoring  1    Opioid Risk Interpretation  Low Risk      ORT Scoring interpretation table:  Score <3 = Low Risk for SUD  Score between 4-7 = Moderate Risk for SUD  Score >8 = High Risk for Opioid Abuse   Risk Mitigation Strategies:  Patient Counseling: Covered Patient-Prescriber Agreement (PPA): Present and active  Notification to other healthcare providers: Done  Pharmacologic Plan: No change in therapy, at this time.             Laboratory Chemistry   Inflammation Markers (CRP: Acute Phase) (ESR: Chronic Phase) Lab Results  Component Value Date   CRP 1.4 09/08/2017   ESRSEDRATE 11 09/08/2017                         Rheumatology Markers No results found for: RF, ANA, [facility], LYMEIGGIGMAB, [facility]                      Renal Function Markers Lab Results  Component Value Date   BUN 7 09/08/2017   CREATININE 0.84 09/08/2017   GFRAA 93 09/08/2017   GFRNONAA 81 09/08/2017                              Hepatic Function Markers Lab Results  Component Value Date   AST 18 09/08/2017   ALT 12 (L) 01/30/2017   ALBUMIN 4.7 09/08/2017   ALKPHOS 73 09/08/2017                        Electrolytes Lab Results  Component Value Date   NA 139 09/08/2017   K 4.4 09/08/2017   CL 99 09/08/2017   CALCIUM 9.8 09/08/2017   MG 2.1 09/08/2017                        Neuropathy Markers Lab Results  Component Value Date   VITAMINB12 >2000 (H) 09/08/2017                        Bone Pathology Markers Lab Results  Component Value Date   VD25OH 43.74 01/10/2016   25OHVITD1 51 09/08/2017   25OHVITD2 <1.0 09/08/2017   25OHVITD3 51 09/08/2017                         Coagulation Parameters Lab Results  Component Value Date   PLT 202.0 05/20/2017                        Cardiovascular Markers Lab Results  Component Value Date   TROPONINI <0.03 01/30/2017   HGB 13.5 05/20/2017   HCT 40.5 05/20/2017                         CA Markers No results found for: CEA, CA125, LABCA2  Note: Lab results reviewed.  Recent Diagnostic Imaging Results  DG C-Arm 1-60 Min-No Report Fluoroscopy was utilized by the requesting physician.  No radiographic  interpretation.   Complexity Note: Imaging results reviewed. Results shared with Karen Edwards, using [employer].                         Meds   Current Outpatient Medications:  .  atorvastatin (LIPITOR) 10 MG tablet, Take 1 tablet by mouth every evening  for cholesterol., Disp: 90 tablet, Rfl: 1 .  Cyanocobalamin (B-12 PO), Take 2,500 mcg by mouth every other day., Disp: , Rfl:  .  FLUoxetine (PROZAC) 20 MG capsule, Take 3 capsules (60 mg total) by mouth daily., Disp: 270 capsule, Rfl: 3 .  [START ON 02/13/2018] HYDROcodone-acetaminophen (NORCO/VICODIN) 5-325 MG tablet, Take 1 tablet by mouth 2 (two) times daily., Disp: 60 tablet, Rfl: 0 .  methocarbamol (ROBAXIN) 500 MG tablet, Take 1 tablet (500 mg total) by mouth every 8 (eight) hours as needed for muscle spasms., Disp: 90 tablet, Rfl: 0 .  oxybutynin (DITROPAN-XL) 5 MG 24 hr tablet, Take 1 tablet (5 mg total) by mouth at bedtime., Disp: 90 tablet, Rfl: 0 .  Potassium 99 MG TABS, Take 1 tablet by mouth daily., Disp: , Rfl:  .  traZODone (DESYREL) 100 MG tablet, Take 3 tablets (300 mg total) by mouth at bedtime., Disp: 270 tablet, Rfl: 3 .  [START ON 03/15/2018] HYDROcodone-acetaminophen (NORCO/VICODIN) 5-325 MG tablet, Take 1 tablet by mouth 2 (two) times daily., Disp: 60 tablet, Rfl: 0 .  linaclotide (LINZESS) 145 MCG CAPS capsule, Take 1 capsule (145 mcg total) by mouth daily before breakfast., Disp: 90 capsule, Rfl: 0 .  naloxegol oxalate (MOVANTIK) 25 MG TABS tablet, Take 1 tablet (25 mg total) by mouth daily. Take on an empty stomach at least 1 hour before or 2 hours after a meal., Disp: 30 tablet, Rfl: 0  ROS  Constitutional: Denies any fever or chills Gastrointestinal: No reported hemesis, hematochezia, vomiting, or acute GI distress Musculoskeletal: Denies any acute onset joint swelling, redness, loss of ROM, or weakness Neurological: No reported episodes of acute onset apraxia, aphasia, dysarthria, agnosia, amnesia, paralysis, loss of coordination, or loss of consciousness  Allergies  Karen Edwards is allergic to no known allergies.  [place]  Drug: Karen Edwards  reports that she does not use drugs. Alcohol:  reports that she drinks about 0.6 oz of alcohol per week. Tobacco:  reports  that she has been smoking cigarettes.  She has a 3.70 pack-year smoking history. She has never used smokeless tobacco. Medical:  has a past medical history of Anxiety, Chronic back pain, Degenerative joint disease, Depression, Hypotension, IBS (irritable bowel syndrome) (05/2015), and MVP (mitral valve prolapse). Surgical: Karen Edwards  has a past surgical history that includes Cesarean section; Ankle surgery (Right, 03/02/2014); Hemorrhoid surgery (N/A, 06/16/2015); Cervical spine surgery (02/21/2016); Cesarean section with bilateral tubal ligation (1990); Colonoscopy (04/19/2015); and Hysteroscopy w/D&C (12/24/2013). Family: family history includes Arthritis in her mother; Congestive Heart Failure in her maternal grandfather; Depression in her mother; Diabetes in her maternal aunt; Diabetes Mellitus I in her maternal grandmother; Gout in her maternal aunt; Hearing loss in her father; Heart attack in her maternal grandfather; Hyperlipidemia in her father and mother; Hypertension in her father and mother; Hypothyroidism in her mother; Kidney disease in her mother; Prostate cancer in her paternal grandfather; Stroke in her maternal grandmother; Thyroid disease in her maternal aunt; Vision  loss in her mother.  Constitutional Exam  General appearance: Well nourished, well developed, and well hydrated. In no apparent acute distress Vitals:   01/29/18 1342  BP: 98/75  Pulse: 71  Resp: 18  Temp: 98.6 F (37 C)  TempSrc: Oral  SpO2: 98%  Weight: 140 lb (63.5 kg)  Height: 5' (1.524 m)   BMI Assessment: Estimated body mass index is 27.34 kg/m as calculated from the following:   Height as of this encounter: 5' (1.524 m).   Weight as of this encounter: 140 lb (63.5 kg). Psych/Mental status: Alert, oriented x 3 (person, place, & time)       Eyes: PERLA Respiratory: No evidence of acute respiratory distress  Cervical Spine Area Exam  Skin & Axial Inspection: Well healed scar from previous spine surgery  detected Alignment: Symmetrical Functional ROM: Diminished ROM      Stability: No instability detected Muscle Tone/Strength: Functionally intact. No obvious neuro-muscular anomalies detected. Sensory (Neurological): Unimpaired Palpation: Complains of area being tender to palpation              Upper Extremity (UE) Exam    Side: Right upper extremity  Side: Left upper extremity  Skin & Extremity Inspection: Skin color, temperature, and hair growth are WNL. No peripheral edema or cyanosis. No masses, redness, swelling, asymmetry, or associated skin lesions. No contractures.  Skin & Extremity Inspection: Skin color, temperature, and hair growth are WNL. No peripheral edema or cyanosis. No masses, redness, swelling, asymmetry, or associated skin lesions. No contractures.  Functional ROM: Unrestricted ROM          Functional ROM: Unrestricted ROM          Muscle Tone/Strength: Functionally intact. No obvious neuro-muscular anomalies detected.  Muscle Tone/Strength: Functionally intact. No obvious neuro-muscular anomalies detected.  Sensory (Neurological): Unimpaired          Sensory (Neurological): Unimpaired          Palpation: No palpable anomalies              Palpation: No palpable anomalies              Specialized Test(s): Deferred         Specialized Test(s): Deferred          Thoracic Spine Area Exam  Skin & Axial Inspection: No masses, redness, or swelling Alignment: Symmetrical Functional ROM: Unrestricted ROM Stability: No instability detected Muscle Tone/Strength: Functionally intact. No obvious neuro-muscular anomalies detected. Sensory (Neurological): Unimpaired Muscle strength & Tone: No palpable anomalies  Lumbar Spine Area Exam  Skin & Axial Inspection: No masses, redness, or swelling Alignment: Symmetrical Functional ROM: Unrestricted ROM      Stability: No instability detected Muscle Tone/Strength: Functionally intact. No obvious neuro-muscular anomalies  detected. Sensory (Neurological): Unimpaired Palpation: No palpable anomalies       Provocative Tests: Lumbar Hyperextension and rotation test: evaluation deferred today       Lumbar Lateral bending test: evaluation deferred today       Patrick's Maneuver: evaluation deferred today                    Gait & Posture Assessment  Ambulation: Unassisted Gait: Relatively normal for age and body habitus Posture: WNL   Lower Extremity Exam    Side: Right lower extremity  Side: Left lower extremity  Skin & Extremity Inspection: Skin color, temperature, and hair growth are WNL. No peripheral edema or cyanosis. No masses, redness, swelling, asymmetry, or associated skin lesions.  No contractures.  Skin & Extremity Inspection: Skin color, temperature, and hair growth are WNL. No peripheral edema or cyanosis. No masses, redness, swelling, asymmetry, or associated skin lesions. No contractures.  Functional ROM: Unrestricted ROM          Functional ROM: Unrestricted ROM          Muscle Tone/Strength: Functionally intact. No obvious neuro-muscular anomalies detected.  Muscle Tone/Strength: Functionally intact. No obvious neuro-muscular anomalies detected.  Sensory (Neurological): Unimpaired  Sensory (Neurological): Unimpaired  Palpation: No palpable anomalies  Palpation: No palpable anomalies   Assessment  Primary Diagnosis & Pertinent Problem List: The primary encounter diagnosis was Myofascial pain. Diagnoses of Cervical facet syndrome, Lumbar spondylosis, Chronic upper extremity pain (Bilateral) (R>L), Chronic sacroiliac joint pain (Bilateral) (L>R), Chronic pain syndrome, Chronic constipation, and Long term prescription opiate use were also pertinent to this visit.  Status Diagnosis  Worsening Worsening Controlled 1. Myofascial pain   2. Cervical facet syndrome   3. Lumbar spondylosis   4. Chronic upper extremity pain (Bilateral) (R>L)   5. Chronic sacroiliac joint pain (Bilateral) (L>R)   6.  Chronic pain syndrome   7. Chronic constipation   8. Long term prescription opiate use     Problems updated and reviewed during this visit: Problem  Myofascial Pain   Plan of Care  Pharmacotherapy (Medications Ordered): Meds ordered this encounter  Medications  . HYDROcodone-acetaminophen (NORCO/VICODIN) 5-325 MG tablet    Sig: Take 1 tablet by mouth 2 (two) times daily.    Dispense:  60 tablet    Refill:  0    Fill one day early if pharmacy is closed on scheduled refill date. Do not fill until:02/13/2018 To last until: 03/15/2018    Order Specific Question:   Supervising Provider    Answer:   Milinda Pointer [phone]  . naloxegol oxalate (MOVANTIK) 25 MG TABS tablet    Sig: Take 1 tablet (25 mg total) by mouth daily. Take on an empty stomach at least 1 hour before or 2 hours after a meal.    Dispense:  30 tablet    Refill:  0    Please instruct the patient not to break the tablet.    Order Specific Question:   Supervising Provider    Answer:   Milinda Pointer [phone]  . HYDROcodone-acetaminophen (NORCO/VICODIN) 5-325 MG tablet    Sig: Take 1 tablet by mouth 2 (two) times daily.    Dispense:  60 tablet    Refill:  0    Do not place this medication, or any other prescription from our practice, on "Automatic Refill". Patient may have prescription filled one day early if pharmacy is closed on scheduled refill date. Do not fill until: 03/15/2018 To last until:04/14/2018    Order Specific Question:   Supervising Provider    Answer:   Milinda Pointer [phone]   New Prescriptions   HYDROCODONE-ACETAMINOPHEN (NORCO/VICODIN) 5-325 MG TABLET    Take 1 tablet by mouth 2 (two) times daily.   NALOXEGOL OXALATE (MOVANTIK) 25 MG TABS TABLET    Take 1 tablet (25 mg total) by mouth daily. Take on an empty stomach at least 1 hour before or 2 hours after a meal.   Medications administered today: Prakriti L. Edwards had no medications administered during this visit. Lab-work, procedure(s),  and/or referral(s): Orders Placed This Encounter  Procedures  . TRIGGER POINT INJECTION  . ToxASSURE Select 13 (MW), Urine   Imaging and/or referral(s): None  Interventional therapies: Planned, scheduled, and/or pending:  Not at this time. Images as above follow-up with Dr. Lowella Dandy for further evaluation and next step in treatment    Considering: Diagnostic bilateral lumbar facet block Possible bilateral lumbar facet RFA Diagnostic bilateral sacroiliac joint block Possible bilateral sacroiliac joint RFA Diagnostic bilateral intra-articular hip joint injection Diagnostic bilateral femoral nerve block + obturator nerve block Possible bilateral femoral nerve + obturator nerve RFA Diagnostic right-sided L1-2lumbar epidural steroid injection Diagnostic right-sided L1-2transforaminal epidural steroid injection Diagnostic bilateralL4-5lumbar transforaminal epidural steroid injection Diagnostic bilateral cervical facet block Possible bilateral cervical facet RFA Diagnostic right-sided cervical epidural steroid injection Diagnostic bilateral greater occipital nerve block Possible bilateral occipital nerve RFA Diagnostic bilateral intra-articular shoulder joint injection Diagnostic bilateral suprascapular nerve block Possible bilateral suprascapular nerve RFA   PRN Procedures: None at this time   Provider-requested follow-up: Return in about 2 months (around 03/31/2018) for in addition.  Future Appointments  Date Time Provider [place]  02/09/2018 10:15 AM Milinda Pointer, MD [facility] None   Primary Care Physician: Pleas Koch, NP Location: [facility] Note by: Vevelyn Francois NP Date: 01/29/2018; Time: 3:44 PM  Pain Score Disclaimer: We use the NRS-11 scale. This is a self-reported, subjective measurement of pain severity with only modest accuracy. It is used primarily to identify changes within a  particular patient. It must be understood that outpatient pain scales are significantly less accurate that those used for research, where they can be applied under ideal controlled circumstances with minimal exposure to variables. In reality, the score is likely to be a combination of pain intensity and pain affect, where pain affect describes the degree of emotional arousal or changes in action readiness caused by the sensory experience of pain. Factors such as social and work situation, setting, emotional state, anxiety levels, expectation, and prior pain experience may influence pain perception and show large inter-individual differences that may also be affected by time variables.  Patient instructions provided during this appointment: Patient Instructions  ____________________________________________________________________________________________  Medication Rules  Applies to: All patients receiving prescriptions (written or electronic).  Pharmacy of record: Pharmacy where electronic prescriptions will be sent. If written prescriptions are taken to a different pharmacy, please inform the nursing staff. The pharmacy listed in the electronic medical record should be the one where you would like electronic prescriptions to be sent.  Prescription refills: Only during scheduled appointments. Applies to both, written and electronic prescriptions.  NOTE: The following applies primarily to controlled substances (Opioid* Pain Medications).   Patient's responsibilities: 1. Pain Pills: Bring all pain pills to every appointment (except for procedure appointments). 2. Pill Bottles: Bring pills in original pharmacy bottle. Always bring newest bottle. Bring bottle, even if empty. 3. Medication refills: You are responsible for knowing and keeping track of what medications you need refilled. The day before your appointment, write a list of all prescriptions that need to be refilled. Bring that list to your  appointment and give it to the admitting nurse. Prescriptions will be written only during appointments. If you forget a medication, it will not be "Called in", "Faxed", or "electronically sent". You will need to get another appointment to get these prescribed. 4. Prescription Accuracy: You are responsible for carefully inspecting your prescriptions before leaving our office. Have the discharge nurse carefully go over each prescription with you, before taking them home. Make sure that your name is accurately spelled, that your address is correct. Check the name and dose of your medication to make sure it is accurate. Check the  number of pills, and the written instructions to make sure they are clear and accurate. Make sure that you are given enough medication to last until your next medication refill appointment. 5. Taking Medication: Take medication as prescribed. Never take more pills than instructed. Never take medication more frequently than prescribed. Taking less pills or less frequently is permitted and encouraged, when it comes to controlled substances (written prescriptions).  6. Inform other Doctors: Always inform, all of your healthcare providers, of all the medications you take. 7. Pain Medication from other Providers: You are not allowed to accept any additional pain medication from any other Doctor or Healthcare provider. There are two exceptions to this rule. (see below) In the event that you require additional pain medication, you are responsible for notifying us, as stated below. 8. Medication Agreement: You are responsible for carefully reading and following our Medication Agreement. This must be signed before receiving any prescriptions from our practice. Safely store a copy of your signed Agreement. Violations to the Agreement will result in no further prescriptions. (Additional copies of our Medication Agreement are available upon request.) 9. Laws, Rules, & Regulations: All patients are  expected to follow all Federal and [employer], [employer], Rules, [employer]. Ignorance of the Laws does not constitute a valid excuse. The use of any illegal substances is prohibited. 10. Adopted CDC guidelines & recommendations: Target dosing levels will be at or below 60 MME/day. Use of benzodiazepines** is not recommended.  Exceptions: There are only two exceptions to the rule of not receiving pain medications from other Healthcare Providers. 1. Exception #1 (Emergencies): In the event of an emergency (i.e.: accident requiring emergency care), you are allowed to receive additional pain medication. However, you are responsible for: As soon as you are able, call our office [phone], at any time of the day or night, and leave a message stating your name, the date and nature of the emergency, and the name and dose of the medication prescribed. In the event that your call is answered by a member of our staff, make sure to document and save the date, time, and the name of the person that took your information.  2. Exception #2 (Planned Surgery): In the event that you are scheduled by another doctor or dentist to have any type of surgery or procedure, you are allowed (for a period no longer than 30 days), to receive additional pain medication, for the acute post-op pain. However, in this case, you are responsible for picking up a copy of our "Post-op Pain Management for Surgeons" handout, and giving it to your surgeon or dentist. This document is available at our office, and does not require an appointment to obtain it. Simply go to our office during business hours (Monday-Thursday from 8:00 AM to 4:00 PM) (Friday 8:00 AM to 12:00 Noon) or if you have a scheduled appointment with [place], prior to your surgery, and ask for it by name. In addition, you will need to provide [place] with your name, name of your surgeon, type of surgery, and date of procedure or surgery.  *Opioid medications include: morphine, codeine,  oxycodone, oxymorphone, hydrocodone, hydromorphone, meperidine, tramadol, tapentadol, buprenorphine, fentanyl, methadone. **Benzodiazepine medications include: diazepam (Valium), alprazolam (Xanax), clonazepam (Klonopine), lorazepam (Ativan), clorazepate (Tranxene), chlordiazepoxide (Librium), estazolam (Prosom), oxazepam (Serax), temazepam (Restoril), triazolam (Halcion) (Last updated: 12/25/2017) ____________________________________________________________________________________________

## 2018-01-29 NOTE — Patient Instructions (Signed)
____________________________________________________________________________________________  Medication Rules  Applies to: All patients receiving prescriptions (written or electronic).  Pharmacy of record: Pharmacy where electronic prescriptions will be sent. If written prescriptions are taken to a different pharmacy, please inform the nursing staff. The pharmacy listed in the electronic medical record should be the one where you would like electronic prescriptions to be sent.  Prescription refills: Only during scheduled appointments. Applies to both, written and electronic prescriptions.  NOTE: The following applies primarily to controlled substances (Opioid* Pain Medications).   Patient's responsibilities: 1. Pain Pills: Bring all pain pills to every appointment (except for procedure appointments). 2. Pill Bottles: Bring pills in original pharmacy bottle. Always bring newest bottle. Bring bottle, even if empty. 3. Medication refills: You are responsible for knowing and keeping track of what medications you need refilled. The day before your appointment, write a list of all prescriptions that need to be refilled. Bring that list to your appointment and give it to the admitting nurse. Prescriptions will be written only during appointments. If you forget a medication, it will not be "Called in", "Faxed", or "electronically sent". You will need to get another appointment to get these prescribed. 4. Prescription Accuracy: You are responsible for carefully inspecting your prescriptions before leaving our office. Have the discharge nurse carefully go over each prescription with you, before taking them home. Make sure that your name is accurately spelled, that your address is correct. Check the name and dose of your medication to make sure it is accurate. Check the number of pills, and the written instructions to make sure they are clear and accurate. Make sure that you are given enough medication to last  until your next medication refill appointment. 5. Taking Medication: Take medication as prescribed. Never take more pills than instructed. Never take medication more frequently than prescribed. Taking less pills or less frequently is permitted and encouraged, when it comes to controlled substances (written prescriptions).  6. Inform other Doctors: Always inform, all of your healthcare providers, of all the medications you take. 7. Pain Medication from other Providers: You are not allowed to accept any additional pain medication from any other Doctor or Healthcare provider. There are two exceptions to this rule. (see below) In the event that you require additional pain medication, you are responsible for notifying us, as stated below. 8. Medication Agreement: You are responsible for carefully reading and following our Medication Agreement. This must be signed before receiving any prescriptions from our practice. Safely store a copy of your signed Agreement. Violations to the Agreement will result in no further prescriptions. (Additional copies of our Medication Agreement are available upon request.) 9. Laws, Rules, & Regulations: All patients are expected to follow all Federal and State Laws, Statutes, Rules, & Regulations. Ignorance of the Laws does not constitute a valid excuse. The use of any illegal substances is prohibited. 10. Adopted CDC guidelines & recommendations: Target dosing levels will be at or below 60 MME/day. Use of benzodiazepines** is not recommended.  Exceptions: There are only two exceptions to the rule of not receiving pain medications from other Healthcare Providers. 1. Exception #1 (Emergencies): In the event of an emergency (i.e.: accident requiring emergency care), you are allowed to receive additional pain medication. However, you are responsible for: As soon as you are able, call our office [phone], at any time of the day or night, and leave a message stating your name, the  date and nature of the emergency, and the name and dose of the medication   prescribed. In the event that your call is answered by a member of our staff, make sure to document and save the date, time, and the name of the person that took your information.  2. Exception #2 (Planned Surgery): In the event that you are scheduled by another doctor or dentist to have any type of surgery or procedure, you are allowed (for a period no longer than 30 days), to receive additional pain medication, for the acute post-op pain. However, in this case, you are responsible for picking up a copy of our "Post-op Pain Management for Surgeons" handout, and giving it to your surgeon or dentist. This document is available at our office, and does not require an appointment to obtain it. Simply go to our office during business hours (Monday-Thursday from 8:00 AM to 4:00 PM) (Friday 8:00 AM to 12:00 Noon) or if you have a scheduled appointment with us, prior to your surgery, and ask for it by name. In addition, you will need to provide us with your name, name of your surgeon, type of surgery, and date of procedure or surgery.  *Opioid medications include: morphine, codeine, oxycodone, oxymorphone, hydrocodone, hydromorphone, meperidine, tramadol, tapentadol, buprenorphine, fentanyl, methadone. **Benzodiazepine medications include: diazepam (Valium), alprazolam (Xanax), clonazepam (Klonopine), lorazepam (Ativan), clorazepate (Tranxene), chlordiazepoxide (Librium), estazolam (Prosom), oxazepam (Serax), temazepam (Restoril), triazolam (Halcion) (Last updated: 12/25/2017) ____________________________________________________________________________________________    

## 2018-02-04 LAB — TOXASSURE SELECT 13 (MW), URINE

## 2018-02-08 NOTE — Progress Notes (Signed)
Patient's Name: Karen Edwards  MRN: [id number]  Referring Provider: Pleas Koch, NP  DOB: Nov 08, 1965  PCP: Pleas Koch, NP  DOS: 02/09/2018  Note by: Gaspar Cola, MD  Service setting: Ambulatory outpatient  Specialty: Interventional Pain Management  Location: [facility] (AMB) Pain Management Facility    Patient type: Established   Primary Reason(s) for Visit: Encounter for post-procedure evaluation of chronic illness with mild to moderate exacerbation CC: Back Pain (lower right is worse ) and Neck Pain  HPI  [name] is a 52 y.o. year old, female patient, who comes today for a post-procedure evaluation. She has Chronic low back pain (Primary Area of Pain) (Bilateral) (R>L); Menorrhagia; Fatigue; DDD (degenerative disc disease), lumbar; DDD (degenerative disc disease), cervical; Chronic occipital neuralgia (Fifth Area of Pain) (Bilateral) (R>L); Lumbar facet syndrome (Bilateral) (L>R); DDD (degenerative disc disease), lumbosacral; Lumbar radicular pain (Right) (L4); Sacroiliac joint dysfunction (Bilateral); Depression; Rectal bleeding; Insomnia; Chronic constipation; Pharmacologic therapy; Hyperlipidemia, unspecified; Chronic neck pain (Fourth Area of Pain) (Bilateral) (R>L); GERD (gastroesophageal reflux disease); Numbness and tingling of upper extremity (Right); Chronic upper extremity pain (Bilateral) (R>L); Chronic sacroiliac joint pain (Bilateral) (L>R); Lumbar facet hypertrophy (Bilateral); L1-2 disc extrusion (Right); Cervical foraminal stenosis (C5-6) (Bilateral); Long term prescription opiate use; Opiate use; Disorder of skeletal system; Problems influencing health status; Chronic pain syndrome; Chronic cervical radicular pain (Right: C6/C7) (Left: C5/T1); Chronic lumbar radicular pain; Chronic hip pain (Tertiary Area of Pain) (Bilateral) (R>L); Chronic lower extremity pain (Secondary Area of Pain) (Bilateral) (R>L); Chronic shoulder pain (Bilateral) (L>R); Chronic  musculoskeletal pain; Lumbar spondylosis; Stress incontinence of urine; Cervical facet syndrome; and Myofascial pain on their problem list. Her primarily concern today is the Back Pain (lower right is worse ) and Neck Pain  Pain Assessment: Location: Lower, Left, Right Back(neck) Radiating: pain radiates down legs and is worse on the right  Onset: More than a month ago Duration: Chronic pain Quality: Aching, Burning, Stabbing, Discomfort Severity: 3 /10 (self-reported pain score)  Note: Reported level is inconsistent with clinical observations. Clinically the patient looks like a 1/10 A 1/10 is viewed as "Mild" and described as nagging, annoying, but not interfering with basic activities of daily living (ADL). [name] is able to eat, bathe, get dressed, do toileting (being able to get on and off the toilet and perform personal hygiene functions), transfer (move in and out of bed or a chair without assistance), and maintain continence (able to control bladder and bowel functions). Physiologic parameters such as blood pressure and heart rate apear wnl. Information on the proper use of the pain scale provided to the patient today. When using our objective Pain Scale, levels between 6 and 10/10 are said to belong in an emergency room, as it progressively worsens from a 6/10, described as severely limiting, requiring emergency care not usually available at an outpatient pain management facility. At a 6/10 level, communication becomes difficult and requires great effort. Assistance to reach the emergency department may be required. Facial flushing and profuse sweating along with potentially dangerous increases in heart rate and blood pressure will be evident. Effect on ADL: unable to maintain household the way she would like to.   Timing: Constant Modifying factors: position changes, hot baths, medications  [name] comes in today for post-procedure evaluation after the treatment done on  01/20/2018.  Further details on both, my assessment(s), as well as the proposed treatment plan, please see below.  Post-Procedure Assessment  01/20/2018 Procedure: Diagnostic right-sided L1-2 interlaminar  lumbar epidural steroid injection under fluoroscopic guidance and IV sedation Pre-procedure pain score:  3/10 Post-procedure pain score: 0/10 (100% relief) Influential Factors: BMI: 26.45 kg/m Intra-procedural challenges: None observed.         Assessment challenges: None detected.              Reported side-effects: None.        Post-procedural adverse reactions or complications: None reported         Sedation: Sedation provided. When no sedatives are used, the analgesic levels obtained are directly associated to the effectiveness of the local anesthetics. However, when sedation is provided, the level of analgesia obtained during the initial 1 hour following the intervention, is believed to be the result of a combination of factors. These factors may include, but are not limited to: 1. The effectiveness of the local anesthetics used. 2. The effects of the analgesic(s) and/or anxiolytic(s) used. 3. The degree of discomfort experienced by the patient at the time of the procedure. 4. The patients ability and reliability in recalling and recording the events. 5. The presence and influence of possible secondary gains and/or psychosocial factors. Reported result: Relief experienced during the 1st hour after the procedure: 100 % (Ultra-Short Term Relief) [name] has indicated area to have been numb during this time. Interpretative annotation: Clinically appropriate result. Analgesia during this period is likely to be Local Anesthetic and/or IV Sedative (Analgesic/Anxiolytic) related.          Effects of local anesthetic: The analgesic effects attained during this period are directly associated to the localized infiltration of local anesthetics and therefore cary significant diagnostic value as  to the etiological location, or anatomical origin, of the pain. Expected duration of relief is directly dependent on the pharmacodynamics of the local anesthetic used. Long-acting (4-6 hours) anesthetics used.  Reported result: Relief during the next 4 to 6 hour after the procedure: 100 % (Short-Term Relief) Ms. Ruperto has indicated area to have been numb during this time. Interpretative annotation: Clinically appropriate result. Analgesia during this period is likely to be Local Anesthetic-related.          Long-term benefit: Defined as the period of time past the expected duration of local anesthetics (1 hour for short-acting and 4-6 hours for long-acting). With the possible exception of prolonged sympathetic blockade from the local anesthetics, benefits during this period are typically attributed to, or associated with, other factors such as analgesic sensory neuropraxia, antiinflammatory effects, or beneficial biochemical changes provided by agents other than the local anesthetics.  Reported result: Extended relief following procedure: 0 %(pain relief was good x 2 days and then pain gradually came back and reports no relief at this point. ) (Long-Term Relief)            Interpretative annotation: Clinically possible results. Recurrence of symptoms. Incomplete therapeutic success. Inflammation plays a part in the etiology to the pain.          Current benefits: Defined as reported results that persistent at this point in time.   Analgesia: 0 %            Function: Back to baseline ROM: Back to baseline Interpretative annotation: Recurrence of symptoms. No permanent benefit expected. Effective diagnostic intervention.          Interpretation: Results would suggest a successful diagnostic intervention.                  Plan:  Repeat treatment or therapy and compare extent and duration  of benefits.                Laboratory Chemistry  Inflammation Markers (CRP: Acute Phase) (ESR: Chronic  Phase) Lab Results  Component Value Date   CRP 1.4 09/08/2017   ESRSEDRATE 11 09/08/2017                         Renal Function Markers Lab Results  Component Value Date   BUN 7 09/08/2017   CREATININE 0.84 09/08/2017   GFRAA 93 09/08/2017   GFRNONAA 81 09/08/2017                              Hepatic Function Markers Lab Results  Component Value Date   AST 18 09/08/2017   ALT 12 (L) 01/30/2017   ALBUMIN 4.7 09/08/2017   ALKPHOS 73 09/08/2017                        Electrolytes Lab Results  Component Value Date   NA 139 09/08/2017   K 4.4 09/08/2017   CL 99 09/08/2017   CALCIUM 9.8 09/08/2017   MG 2.1 09/08/2017                        Neuropathy Markers Lab Results  Component Value Date   VITAMINB12 >2000 (H) 09/08/2017                        Bone Pathology Markers Lab Results  Component Value Date   VD25OH 43.74 01/10/2016   25OHVITD1 51 09/08/2017   25OHVITD2 <1.0 09/08/2017   25OHVITD3 51 09/08/2017                         Coagulation Parameters Lab Results  Component Value Date   PLT 202.0 05/20/2017                        Cardiovascular Markers Lab Results  Component Value Date   TROPONINI <0.03 01/30/2017   HGB 13.5 05/20/2017   HCT 40.5 05/20/2017                         Note: Lab results reviewed.  Recent Diagnostic Imaging Results  DG C-Arm 1-60 Min-No Report Fluoroscopy was utilized by the requesting physician.  No radiographic  interpretation.   Complexity Note: I personally reviewed the fluoroscopic imaging of the procedure.                        Meds   Current Outpatient Medications:  .  Cyanocobalamin (B-12 PO), Take 2,500 mcg by mouth every other day., Disp: , Rfl:  .  FLUoxetine (PROZAC) 20 MG capsule, Take 3 capsules (60 mg total) by mouth daily., Disp: 270 capsule, Rfl: 3 .  Green Tea, Camillia sinensis, (GREEN TEA PO), Take 1 capsule by mouth daily., Disp: , Rfl:  .  [START ON 02/13/2018] HYDROcodone-acetaminophen  (NORCO/VICODIN) 5-325 MG tablet, Take 1 tablet by mouth 2 (two) times daily., Disp: 60 tablet, Rfl: 0 .  [START ON 03/15/2018] HYDROcodone-acetaminophen (NORCO/VICODIN) 5-325 MG tablet, Take 1 tablet by mouth 2 (two) times daily., Disp: 60 tablet, Rfl: 0 .  methocarbamol (ROBAXIN) 500 MG tablet, Take 1 tablet (500 mg total) by mouth every  8 (eight) hours as needed for muscle spasms., Disp: 90 tablet, Rfl: 0 .  oxybutynin (DITROPAN-XL) 5 MG 24 hr tablet, Take 1 tablet (5 mg total) by mouth at bedtime., Disp: 90 tablet, Rfl: 0 .  Potassium 99 MG TABS, Take 1 tablet by mouth daily., Disp: , Rfl:  .  traZODone (DESYREL) 100 MG tablet, Take 3 tablets (300 mg total) by mouth at bedtime., Disp: 270 tablet, Rfl: 3 .  linaclotide (LINZESS) 145 MCG CAPS capsule, Take 1 capsule (145 mcg total) by mouth daily before breakfast., Disp: 90 capsule, Rfl: 0  ROS  Constitutional: Denies any fever or chills Gastrointestinal: No reported hemesis, hematochezia, vomiting, or acute GI distress Musculoskeletal: Denies any acute onset joint swelling, redness, loss of ROM, or weakness Neurological: No reported episodes of acute onset apraxia, aphasia, dysarthria, agnosia, amnesia, paralysis, loss of coordination, or loss of consciousness  Allergies  Ms. Daddario is allergic to no known allergies.  [place]  Drug: Ms. Musolf  reports that she does not use drugs. Alcohol:  reports that she drinks about 0.6 oz of alcohol per week. Tobacco:  reports that she has been smoking cigarettes.  She has a 3.70 pack-year smoking history. She has never used smokeless tobacco. Medical:  has a past medical history of Anxiety, Chronic back pain, Degenerative joint disease, Depression, Hypotension, IBS (irritable bowel syndrome) (05/2015), and MVP (mitral valve prolapse). Surgical: Ms. Seckinger  has a past surgical history that includes Cesarean section; Ankle surgery (Right, 03/02/2014); Hemorrhoid surgery (N/A, 06/16/2015); Cervical spine  surgery (02/21/2016); Cesarean section with bilateral tubal ligation (1990); Colonoscopy (04/19/2015); and Hysteroscopy w/D&C (12/24/2013). Family: family history includes Arthritis in her mother; Congestive Heart Failure in her maternal grandfather; Depression in her mother; Diabetes in her maternal aunt; Diabetes Mellitus I in her maternal grandmother; Gout in her maternal aunt; Hearing loss in her father; Heart attack in her maternal grandfather; Hyperlipidemia in her father and mother; Hypertension in her father and mother; Hypothyroidism in her mother; Kidney disease in her mother; Prostate cancer in her paternal grandfather; Stroke in her maternal grandmother; Thyroid disease in her maternal aunt; Vision loss in her mother.  Constitutional Exam  General appearance: Well nourished, well developed, and well hydrated. In no apparent acute distress Vitals:   02/09/18 1021  BP: 114/76  Pulse: 93  Resp: 16  Temp: 98.8 F (37.1 C)  TempSrc: Oral  SpO2: 98%  Weight: 140 lb (63.5 kg)  Height: 5' 1"  (1.549 m)   BMI Assessment: Estimated body mass index is 26.45 kg/m as calculated from the following:   Height as of this encounter: 5' 1"  (1.549 m).   Weight as of this encounter: 140 lb (63.5 kg).  BMI interpretation table: BMI level Category Range association with higher incidence of chronic pain  <18 kg/m2 Underweight   18.5-24.9 kg/m2 Ideal body weight   25-29.9 kg/m2 Overweight Increased incidence by 20%  30-34.9 kg/m2 Obese (Class I) Increased incidence by 68%  35-39.9 kg/m2 Severe obesity (Class II) Increased incidence by 136%  >40 kg/m2 Extreme obesity (Class III) Increased incidence by 254%   BMI Readings from Last 4 Encounters:  02/09/18 26.45 kg/m  01/29/18 27.34 kg/m  01/20/18 27.34 kg/m  01/19/18 26.45 kg/m   Wt Readings from Last 4 Encounters:  02/09/18 140 lb (63.5 kg)  01/29/18 140 lb (63.5 kg)  01/20/18 140 lb (63.5 kg)  01/19/18 140 lb (63.5 kg)  Psych/Mental  status: Alert, oriented x 3 (person, place, & time)  Eyes: PERLA Respiratory: No evidence of acute respiratory distress  Cervical Spine Area Exam  Skin & Axial Inspection: No masses, redness, edema, swelling, or associated skin lesions Alignment: Symmetrical Functional ROM: Unrestricted ROM      Stability: No instability detected Muscle Tone/Strength: Functionally intact. No obvious neuro-muscular anomalies detected. Sensory (Neurological): Unimpaired Palpation: No palpable anomalies              Upper Extremity (UE) Exam    Side: Right upper extremity  Side: Left upper extremity  Skin & Extremity Inspection: Skin color, temperature, and hair growth are WNL. No peripheral edema or cyanosis. No masses, redness, swelling, asymmetry, or associated skin lesions. No contractures.  Skin & Extremity Inspection: Skin color, temperature, and hair growth are WNL. No peripheral edema or cyanosis. No masses, redness, swelling, asymmetry, or associated skin lesions. No contractures.  Functional ROM: Unrestricted ROM          Functional ROM: Unrestricted ROM          Muscle Tone/Strength: Functionally intact. No obvious neuro-muscular anomalies detected.  Muscle Tone/Strength: Functionally intact. No obvious neuro-muscular anomalies detected.  Sensory (Neurological): Unimpaired          Sensory (Neurological): Unimpaired          Palpation: No palpable anomalies              Palpation: No palpable anomalies              Specialized Test(s): Deferred         Specialized Test(s): Deferred          Thoracic Spine Area Exam  Skin & Axial Inspection: No masses, redness, or swelling Alignment: Symmetrical Functional ROM: Unrestricted ROM Stability: No instability detected Muscle Tone/Strength: Functionally intact. No obvious neuro-muscular anomalies detected. Sensory (Neurological): Unimpaired Muscle strength & Tone: No palpable anomalies  Lumbar Spine Area Exam  Skin & Axial Inspection: No masses,  redness, or swelling Alignment: Symmetrical Functional ROM: Unrestricted ROM      Stability: No instability detected Muscle Tone/Strength: Functionally intact. No obvious neuro-muscular anomalies detected. Sensory (Neurological): Unimpaired Palpation: No palpable anomalies       Provocative Tests: Lumbar Hyperextension and rotation test: evaluation deferred today       Lumbar Lateral bending test: evaluation deferred today       Patrick's Maneuver: evaluation deferred today                    Gait & Posture Assessment  Ambulation: Unassisted Gait: Relatively normal for age and body habitus Posture: WNL   Lower Extremity Exam    Side: Right lower extremity  Side: Left lower extremity  Skin & Extremity Inspection: Skin color, temperature, and hair growth are WNL. No peripheral edema or cyanosis. No masses, redness, swelling, asymmetry, or associated skin lesions. No contractures.  Skin & Extremity Inspection: Skin color, temperature, and hair growth are WNL. No peripheral edema or cyanosis. No masses, redness, swelling, asymmetry, or associated skin lesions. No contractures.  Functional ROM: Unrestricted ROM          Functional ROM: Unrestricted ROM          Muscle Tone/Strength: Functionally intact. No obvious neuro-muscular anomalies detected.  Muscle Tone/Strength: Functionally intact. No obvious neuro-muscular anomalies detected.  Sensory (Neurological): Unimpaired  Sensory (Neurological): Unimpaired  Palpation: No palpable anomalies  Palpation: No palpable anomalies   Assessment  Primary Diagnosis & Pertinent Problem List: The primary encounter diagnosis was Chronic low back pain (Primary Area  of Pain) (Bilateral) (R>L). Diagnoses of Chronic lower extremity pain (Secondary Area of Pain) (Bilateral) (R>L), Chronic lumbar radicular pain, DDD (degenerative disc disease), lumbar, L1-2 disc extrusion (Right), Lumbar radicular pain (Right) (L4), and Lumbar spondylosis were also pertinent to  this visit.  Status Diagnosis  Controlled Controlled Controlled 1. Chronic low back pain (Primary Area of Pain) (Bilateral) (R>L)   2. Chronic lower extremity pain (Secondary Area of Pain) (Bilateral) (R>L)   3. Chronic lumbar radicular pain   4. DDD (degenerative disc disease), lumbar   5. L1-2 disc extrusion (Right)   6. Lumbar radicular pain (Right) (L4)   7. Lumbar spondylosis     Problems updated and reviewed during this visit: Problem  Myofascial Pain   Plan of Care  Pharmacotherapy (Medications Ordered): No orders of the defined types were placed in this encounter.  Medications administered today: Kaithlyn L. Talton had no medications administered during this visit.   Procedure Orders     Lumbar Epidural Injection     Lumbar Transforaminal Epidural Lab Orders  No laboratory test(s) ordered today   Imaging Orders  No imaging studies ordered today    Referral Orders     Ambulatory referral to Neurosurgery  Interventional management options: Planned, scheduled, and/or pending:   Diagnostic right-sided L1-2 interlaminar LESI #2 + diagnostic right-sided L1 transforaminal LESI #1 under fluoroscopic guidance and IV sedation Neurosurgery consult for possible right-sided L1-2 disc extrusion causing an L1/L2 right-sided radiculopathy.   Considering:   Diagnostic bilateral lumbar facet block Possible bilateral lumbar facet RFA Diagnostic bilateral sacroiliac joint block Possible bilateral sacroiliac joint RFA Diagnostic bilateral intra-articular hip joint injection Diagnostic bilateral femoral nerve block + obturator nerve block Possible bilateral femoral nerve + obturator nerve RFA Diagnostic right-sided L1-2lumbar epidural steroid injection Diagnostic right-sided L1-2transforaminal epidural steroid injection Diagnostic bilateralL4-5lumbar transforaminal epidural steroid injection Diagnostic bilateral cervical facet block Possible bilateral cervical  facet RFA Diagnostic right-sided cervical epidural steroid injection Diagnostic bilateral greater occipital nerve block Possible bilateral occipital nerve RFA Diagnostic bilateral intra-articular shoulder joint injection Diagnostic bilateral suprascapular nerve block Possible bilateral suprascapular nerve RFA   Palliative PRN treatment(s):   None at this time   Provider-requested follow-up: Return for Procedure (w/ sedation): (R) L1 TFESI + (R) L1-2 LESI.  Future Appointments  Date Time Provider [place]  02/10/2018  1:00 PM Milinda Pointer, MD [facility] None   Primary Care Physician: Pleas Koch, NP Location: [facility] Note by: Gaspar Cola, MD Date: 02/09/2018; Time: 11:43 AM

## 2018-02-09 ENCOUNTER — Ambulatory Visit: Payer: PPO | Attending: Pain Medicine | Admitting: Pain Medicine

## 2018-02-09 ENCOUNTER — Encounter: Payer: Self-pay | Admitting: Pain Medicine

## 2018-02-09 ENCOUNTER — Encounter: Payer: Self-pay | Admitting: Primary Care

## 2018-02-09 VITALS — BP 114/76 | HR 93 | Temp 98.8°F | Resp 16 | Ht 61.0 in | Wt 140.0 lb

## 2018-02-09 DIAGNOSIS — M4802 Spinal stenosis, cervical region: Secondary | ICD-10-CM | POA: Diagnosis not present

## 2018-02-09 DIAGNOSIS — M47816 Spondylosis without myelopathy or radiculopathy, lumbar region: Secondary | ICD-10-CM

## 2018-02-09 DIAGNOSIS — M4726 Other spondylosis with radiculopathy, lumbar region: Secondary | ICD-10-CM | POA: Insufficient documentation

## 2018-02-09 DIAGNOSIS — F419 Anxiety disorder, unspecified: Secondary | ICD-10-CM | POA: Insufficient documentation

## 2018-02-09 DIAGNOSIS — G4700 Insomnia, unspecified: Secondary | ICD-10-CM | POA: Diagnosis not present

## 2018-02-09 DIAGNOSIS — Z9851 Tubal ligation status: Secondary | ICD-10-CM | POA: Insufficient documentation

## 2018-02-09 DIAGNOSIS — N393 Stress incontinence (female) (male): Secondary | ICD-10-CM | POA: Insufficient documentation

## 2018-02-09 DIAGNOSIS — Z833 Family history of diabetes mellitus: Secondary | ICD-10-CM | POA: Insufficient documentation

## 2018-02-09 DIAGNOSIS — I959 Hypotension, unspecified: Secondary | ICD-10-CM | POA: Insufficient documentation

## 2018-02-09 DIAGNOSIS — M5137 Other intervertebral disc degeneration, lumbosacral region: Secondary | ICD-10-CM | POA: Diagnosis not present

## 2018-02-09 DIAGNOSIS — M5416 Radiculopathy, lumbar region: Secondary | ICD-10-CM

## 2018-02-09 DIAGNOSIS — M79605 Pain in left leg: Secondary | ICD-10-CM

## 2018-02-09 DIAGNOSIS — K581 Irritable bowel syndrome with constipation: Secondary | ICD-10-CM | POA: Diagnosis not present

## 2018-02-09 DIAGNOSIS — F329 Major depressive disorder, single episode, unspecified: Secondary | ICD-10-CM | POA: Diagnosis not present

## 2018-02-09 DIAGNOSIS — Z79891 Long term (current) use of opiate analgesic: Secondary | ICD-10-CM | POA: Diagnosis not present

## 2018-02-09 DIAGNOSIS — M5116 Intervertebral disc disorders with radiculopathy, lumbar region: Secondary | ICD-10-CM | POA: Diagnosis not present

## 2018-02-09 DIAGNOSIS — K625 Hemorrhage of anus and rectum: Secondary | ICD-10-CM | POA: Insufficient documentation

## 2018-02-09 DIAGNOSIS — F1110 Opioid abuse, uncomplicated: Secondary | ICD-10-CM | POA: Diagnosis not present

## 2018-02-09 DIAGNOSIS — M5441 Lumbago with sciatica, right side: Secondary | ICD-10-CM

## 2018-02-09 DIAGNOSIS — Z9889 Other specified postprocedural states: Secondary | ICD-10-CM | POA: Insufficient documentation

## 2018-02-09 DIAGNOSIS — N920 Excessive and frequent menstruation with regular cycle: Secondary | ICD-10-CM | POA: Diagnosis not present

## 2018-02-09 DIAGNOSIS — M5481 Occipital neuralgia: Secondary | ICD-10-CM | POA: Diagnosis not present

## 2018-02-09 DIAGNOSIS — M7918 Myalgia, other site: Secondary | ICD-10-CM | POA: Diagnosis not present

## 2018-02-09 DIAGNOSIS — I341 Nonrheumatic mitral (valve) prolapse: Secondary | ICD-10-CM | POA: Insufficient documentation

## 2018-02-09 DIAGNOSIS — E785 Hyperlipidemia, unspecified: Secondary | ICD-10-CM | POA: Insufficient documentation

## 2018-02-09 DIAGNOSIS — K219 Gastro-esophageal reflux disease without esophagitis: Secondary | ICD-10-CM | POA: Insufficient documentation

## 2018-02-09 DIAGNOSIS — M25512 Pain in left shoulder: Secondary | ICD-10-CM | POA: Diagnosis not present

## 2018-02-09 DIAGNOSIS — M5136 Other intervertebral disc degeneration, lumbar region: Secondary | ICD-10-CM | POA: Diagnosis not present

## 2018-02-09 DIAGNOSIS — F17210 Nicotine dependence, cigarettes, uncomplicated: Secondary | ICD-10-CM | POA: Diagnosis not present

## 2018-02-09 DIAGNOSIS — M5442 Lumbago with sciatica, left side: Secondary | ICD-10-CM | POA: Diagnosis not present

## 2018-02-09 DIAGNOSIS — G8929 Other chronic pain: Secondary | ICD-10-CM | POA: Diagnosis not present

## 2018-02-09 DIAGNOSIS — M79604 Pain in right leg: Secondary | ICD-10-CM

## 2018-02-09 DIAGNOSIS — Z79899 Other long term (current) drug therapy: Secondary | ICD-10-CM | POA: Diagnosis not present

## 2018-02-09 DIAGNOSIS — M5126 Other intervertebral disc displacement, lumbar region: Secondary | ICD-10-CM | POA: Diagnosis not present

## 2018-02-09 DIAGNOSIS — G894 Chronic pain syndrome: Secondary | ICD-10-CM | POA: Insufficient documentation

## 2018-02-09 DIAGNOSIS — M25511 Pain in right shoulder: Secondary | ICD-10-CM | POA: Diagnosis not present

## 2018-02-09 DIAGNOSIS — Z841 Family history of disorders of kidney and ureter: Secondary | ICD-10-CM | POA: Insufficient documentation

## 2018-02-09 DIAGNOSIS — Z8042 Family history of malignant neoplasm of prostate: Secondary | ICD-10-CM | POA: Insufficient documentation

## 2018-02-09 DIAGNOSIS — E782 Mixed hyperlipidemia: Secondary | ICD-10-CM

## 2018-02-09 DIAGNOSIS — Z8249 Family history of ischemic heart disease and other diseases of the circulatory system: Secondary | ICD-10-CM | POA: Insufficient documentation

## 2018-02-09 DIAGNOSIS — Z8261 Family history of arthritis: Secondary | ICD-10-CM | POA: Insufficient documentation

## 2018-02-09 DIAGNOSIS — Z823 Family history of stroke: Secondary | ICD-10-CM | POA: Insufficient documentation

## 2018-02-09 NOTE — Patient Instructions (Addendum)
____________________________________________________________________________________________  Preparing for Procedure with Sedation  Instructions: . Oral Intake: Do not eat or drink anything for at least 8 hours prior to your procedure. . Transportation: Public transportation is not allowed. Bring an adult driver. The driver must be physically present in our waiting room before any procedure can be started. . Physical Assistance: Bring an adult physically capable of assisting you, in the event you need help. This adult should keep you company at home for at least 6 hours after the procedure. . Blood Pressure Medicine: Take your blood pressure medicine with a sip of water the morning of the procedure. . Blood thinners:  . Diabetics on insulin: Notify the staff so that you can be scheduled 1st case in the morning. If your diabetes requires high dose insulin, take only  of your normal insulin dose the morning of the procedure and notify the staff that you have done so. . Preventing infections: Shower with an antibacterial soap the morning of your procedure. . Build-up your immune system: Take 1000 mg of Vitamin C with every meal (3 times a day) the day prior to your procedure. . Antibiotics: Inform the staff if you have a condition or reason that requires you to take antibiotics before dental procedures. . Pregnancy: If you are pregnant, call and cancel the procedure. . Sickness: If you have a cold, fever, or any active infections, call and cancel the procedure. . Arrival: You must be in the facility at least 30 minutes prior to your scheduled procedure. . Children: Do not bring children with you. . Dress appropriately: Bring dark clothing that you would not mind if they get stained. . Valuables: Do not bring any jewelry or valuables.  Procedure appointments are reserved for interventional treatments only. . No Prescription Refills. . No medication changes will be discussed during procedure  appointments. . No disability issues will be discussed.  Remember:  Regular Business hours are:  Monday to Thursday 8:00 AM to 4:00 PM  Provider's Schedule: Zed Wanninger, MD:  Procedure days: Tuesday and Thursday 7:30 AM to 4:00 PM  Bilal Lateef, MD:  Procedure days: Monday and Wednesday 7:30 AM to 4:00 PM ____________________________________________________________________________________________  ____________________________________________________________________________________________  General Risks and Possible Complications  Patient Responsibilities: It is important that you read this as it is part of your informed consent. It is our duty to inform you of the risks and possible complications associated with treatments offered to you. It is your responsibility as a patient to read this and to ask questions about anything that is not clear or that you believe was not covered in this document.  Patient's Rights: You have the right to refuse treatment. You also have the right to change your mind, even after initially having agreed to have the treatment done. However, under this last option, if you wait until the last second to change your mind, you may be charged for the materials used up to that point.  Introduction: Medicine is not an exact science. Everything in Medicine, including the lack of treatment(s), carries the potential for danger, harm, or loss (which is by definition: Risk). In Medicine, a complication is a secondary problem, condition, or disease that can aggravate an already existing one. All treatments carry the risk of possible complications. The fact that a side effects or complications occurs, does not imply that the treatment was conducted incorrectly. It must be clearly understood that these can happen even when everything is done following the highest safety standards.  No treatment: You   can choose not to proceed with the proposed treatment alternative. The  "PRO(s)" would include: avoiding the risk of complications associated with the therapy. The "CON(s)" would include: not getting any of the treatment benefits. These benefits fall under one of three categories: diagnostic; therapeutic; and/or palliative. Diagnostic benefits include: getting information which can ultimately lead to improvement of the disease or symptom(s). Therapeutic benefits are those associated with the successful treatment of the disease. Finally, palliative benefits are those related to the decrease of the primary symptoms, without necessarily curing the condition (example: decreasing the pain from a flare-up of a chronic condition, such as incurable terminal cancer).  General Risks and Complications: These are associated to most interventional treatments. They can occur alone, or in combination. They fall under one of the following six (6) categories: no benefit or worsening of symptoms; bleeding; infection; nerve damage; allergic reactions; and/or death. 1. No benefits or worsening of symptoms: In Medicine there are no guarantees, only probabilities. No healthcare provider can ever guarantee that a medical treatment will work, they can only state the probability that it may. Furthermore, there is always the possibility that the condition may worsen, either directly, or indirectly, as a consequence of the treatment. 2. Bleeding: This is more common if the patient is taking a blood thinner, either prescription or over the counter (example: Goody Powders, Fish oil, Aspirin, Garlic, etc.), or if suffering a condition associated with impaired coagulation (example: Hemophilia, cirrhosis of the liver, low platelet counts, etc.). However, even if you do not have one on these, it can still happen. If you have any of these conditions, or take one of these drugs, make sure to notify your treating physician. 3. Infection: This is more common in patients with a compromised immune system, either due to  disease (example: diabetes, cancer, human immunodeficiency virus [HIV], etc.), or due to medications or treatments (example: therapies used to treat cancer and rheumatological diseases). However, even if you do not have one on these, it can still happen. If you have any of these conditions, or take one of these drugs, make sure to notify your treating physician. 4. Nerve Damage: This is more common when the treatment is an invasive one, but it can also happen with the use of medications, such as those used in the treatment of cancer. The damage can occur to small secondary nerves, or to large primary ones, such as those in the spinal cord and brain. This damage may be temporary or permanent and it may lead to impairments that can range from temporary numbness to permanent paralysis and/or brain death. 5. Allergic Reactions: Any time a substance or material comes in contact with our body, there is the possibility of an allergic reaction. These can range from a mild skin rash (contact dermatitis) to a severe systemic reaction (anaphylactic reaction), which can result in death. 6. Death: In general, any medical intervention can result in death, most of the time due to an unforeseen complication. ____________________________________________________________________________________________  Epidural Steroid Injection Patient Information  Description: The epidural space surrounds the nerves as they exit the spinal cord.  In some patients, the nerves can be compressed and inflamed by a bulging disc or a tight spinal canal (spinal stenosis).  By injecting steroids into the epidural space, we can bring irritated nerves into direct contact with a potentially helpful medication.  These steroids act directly on the irritated nerves and can reduce swelling and inflammation which often leads to decreased pain.  Epidural steroids may be  injected anywhere along the spine and from the neck to the low back depending upon the  location of your pain.   After numbing the skin with local anesthetic (like Novocaine), a small needle is passed into the epidural space slowly.  You may experience a sensation of pressure while this is being done.  The entire block usually last less than 10 minutes.  Conditions which may be treated by epidural steroids:   Low back and leg pain  Neck and arm pain  Spinal stenosis  Post-laminectomy syndrome  Herpes zoster (shingles) pain  Pain from compression fractures  Preparation for the injection:  1. Do not eat any solid food or dairy products within 8 hours of your appointment.  2. You may drink clear liquids up to 3 hours before appointment.  Clear liquids include water, black coffee, juice or soda.  No milk or cream please. 3. You may take your regular medication, including pain medications, with a sip of water before your appointment  Diabetics should hold regular insulin (if taken separately) and take 1/2 normal NPH dos the morning of the procedure.  Carry some sugar containing items with you to your appointment. 4. A driver must accompany you and be prepared to drive you home after your procedure.  5. Bring all your current medications with your. 6. An IV may be inserted and sedation may be given at the discretion of the physician.   7. A blood pressure cuff, EKG and other monitors will often be applied during the procedure.  Some patients may need to have extra oxygen administered for a short period. 8. You will be asked to provide medical information, including your allergies, prior to the procedure.  We must know immediately if you are taking blood thinners (like Coumadin/Warfarin)  Or if you are allergic to IV iodine contrast (dye). We must know if you could possible be pregnant.  Possible side-effects:  Bleeding from needle site  Infection (rare, may require surgery)  Nerve injury (rare)  Numbness & tingling (temporary)  Difficulty urinating (rare,  temporary)  Spinal headache ( a headache worse with upright posture)  Light -headedness (temporary)  Pain at injection site (several days)  Decreased blood pressure (temporary)  Weakness in arm/leg (temporary)  Pressure sensation in back/neck (temporary)  Call if you experience:  Fever/chills associated with headache or increased back/neck pain.  Headache worsened by an upright position.  New onset weakness or numbness of an extremity below the injection site  Hives or difficulty breathing (go to the emergency room)  Inflammation or drainage at the infection site  Severe back/neck pain  Any new symptoms which are concerning to you  Please note:  Although the local anesthetic injected can often make your back or neck feel good for several hours after the injection, the pain will likely return.  It takes 3-7 days for steroids to work in the epidural space.  You may not notice any pain relief for at least that one week.  If effective, we will often do a series of three injections spaced 3-6 weeks apart to maximally decrease your pain.  After the initial series, we generally will wait several months before considering a repeat injection of the same type.  If you have any questions, please call [phone] [facility]

## 2018-02-10 ENCOUNTER — Ambulatory Visit
Admission: RE | Admit: 2018-02-10 | Discharge: 2018-02-10 | Disposition: A | Discharge status: Home / Self Care | Payer: PPO | Source: Ambulatory Visit | Attending: Pain Medicine | Admitting: Pain Medicine

## 2018-02-10 ENCOUNTER — Ambulatory Visit (HOSPITAL_BASED_OUTPATIENT_CLINIC_OR_DEPARTMENT_OTHER): Payer: PPO | Admitting: Pain Medicine

## 2018-02-10 ENCOUNTER — Other Ambulatory Visit: Payer: Self-pay

## 2018-02-10 ENCOUNTER — Encounter: Payer: Self-pay | Admitting: Pain Medicine

## 2018-02-10 VITALS — BP 132/79 | HR 71 | Temp 98.5°F | Resp 16 | Ht 61.0 in | Wt 140.0 lb

## 2018-02-10 DIAGNOSIS — M4726 Other spondylosis with radiculopathy, lumbar region: Secondary | ICD-10-CM | POA: Insufficient documentation

## 2018-02-10 DIAGNOSIS — M47816 Spondylosis without myelopathy or radiculopathy, lumbar region: Secondary | ICD-10-CM

## 2018-02-10 DIAGNOSIS — G8929 Other chronic pain: Secondary | ICD-10-CM | POA: Insufficient documentation

## 2018-02-10 DIAGNOSIS — M5416 Radiculopathy, lumbar region: Secondary | ICD-10-CM

## 2018-02-10 DIAGNOSIS — M5116 Intervertebral disc disorders with radiculopathy, lumbar region: Secondary | ICD-10-CM | POA: Diagnosis not present

## 2018-02-10 DIAGNOSIS — M5136 Other intervertebral disc degeneration, lumbar region: Secondary | ICD-10-CM

## 2018-02-10 DIAGNOSIS — M5126 Other intervertebral disc displacement, lumbar region: Secondary | ICD-10-CM

## 2018-02-10 DIAGNOSIS — M79605 Pain in left leg: Secondary | ICD-10-CM

## 2018-02-10 DIAGNOSIS — M79604 Pain in right leg: Secondary | ICD-10-CM

## 2018-02-10 MED ORDER — ROPIVACAINE HCL 2 MG/ML IJ SOLN
1.0000 mL | Freq: Once | INTRAMUSCULAR | Status: AC
  Administered 2018-02-10: 10 mL via EPIDURAL
  Filled 2018-02-10: qty 10

## 2018-02-10 MED ORDER — ROPIVACAINE HCL 2 MG/ML IJ SOLN
2.0000 mL | Freq: Once | INTRAMUSCULAR | Status: AC
  Administered 2018-02-10: 10 mL via EPIDURAL
  Filled 2018-02-10: qty 10

## 2018-02-10 MED ORDER — FENTANYL CITRATE (PF) 100 MCG/2ML IJ SOLN
25.0000 ug | INTRAMUSCULAR | Status: DC | PRN
  Administered 2018-02-10: 100 ug via INTRAVENOUS
  Filled 2018-02-10: qty 2

## 2018-02-10 MED ORDER — TRIAMCINOLONE ACETONIDE 40 MG/ML IJ SUSP
40.0000 mg | Freq: Once | INTRAMUSCULAR | Status: AC
  Administered 2018-02-10: 40 mg
  Filled 2018-02-10: qty 1

## 2018-02-10 MED ORDER — DEXAMETHASONE SODIUM PHOSPHATE 10 MG/ML IJ SOLN
10.0000 mg | Freq: Once | INTRAMUSCULAR | Status: AC
  Administered 2018-02-10: 10 mg
  Filled 2018-02-10: qty 1

## 2018-02-10 MED ORDER — ATORVASTATIN CALCIUM 10 MG PO TABS: 10.0000 mg | ORAL_TABLET | Freq: Every day | ORAL | 3 refills | Status: DC

## 2018-02-10 MED ORDER — SODIUM CHLORIDE 0.9% FLUSH
1.0000 mL | Freq: Once | INTRAVENOUS | Status: AC
  Administered 2018-02-10: 10 mL

## 2018-02-10 MED ORDER — SODIUM CHLORIDE 0.9% FLUSH
2.0000 mL | Freq: Once | INTRAVENOUS | Status: AC
  Administered 2018-02-10: 10 mL

## 2018-02-10 MED ORDER — LACTATED RINGERS IV SOLN
1000.0000 mL | Freq: Once | INTRAVENOUS | Status: AC
  Administered 2018-02-10: 1000 mL via INTRAVENOUS

## 2018-02-10 MED ORDER — LIDOCAINE HCL 2 % IJ SOLN
20.0000 mL | Freq: Once | INTRAMUSCULAR | Status: AC
  Administered 2018-02-10: 400 mg
  Filled 2018-02-10: qty 20

## 2018-02-10 MED ORDER — MIDAZOLAM HCL 5 MG/5ML IJ SOLN
1.0000 mg | INTRAMUSCULAR | Status: DC | PRN
  Administered 2018-02-10: 2 mg via INTRAVENOUS
  Filled 2018-02-10: qty 5

## 2018-02-10 MED ORDER — IOPAMIDOL (ISOVUE-M 200) INJECTION 41%
10.0000 mL | Freq: Once | INTRAMUSCULAR | Status: AC
  Administered 2018-02-10: 10 mL via EPIDURAL
  Filled 2018-02-10: qty 10

## 2018-02-10 NOTE — Progress Notes (Signed)
Patient's Name: Karen Edwards  MRN: [id number]  Referring Provider: Pleas Koch, NP  DOB: 06-06-1966  PCP: Pleas Koch, NP  DOS: 02/10/2018  Note by: Gaspar Cola, MD  Service setting: Ambulatory outpatient  Specialty: Interventional Pain Management  Patient type: Established  Location: [facility] (AMB) Pain Management Facility  Visit type: Interventional Procedure   Primary Reason for Visit: Interventional Pain Management Treatment. CC: Back Pain (Lower Back Right side) and Neck Pain (Bilateral)  Procedure:  Anesthesia, Analgesia, Anxiolysis:  Procedure #1: Type: Diagnostic Inter-Laminar Epidural Steroid Injection           Region: Lumbar Level: L1-2 Level. Laterality: Right Paramedial  Procedure #2: Type: Diagnostic Trans-Foraminal Epidural Steroid Injection           Region: Lumbar Level: L1 Paravertebral Laterality: Right Paravertebral Position: Prone  Type: Moderate (Conscious) Sedation combined with Local Anesthesia Indication(s): Analgesia and Anxiety Route: Intravenous (IV) IV Access: Secured Sedation: Meaningful verbal contact was maintained at all times during the procedure  Local Anesthetic: Lidocaine 1-2%   Indications: 1. DDD (degenerative disc disease), lumbar   2. Chronic lumbar radicular pain   3. Chronic lower extremity pain (Secondary Area of Pain) (Bilateral) (R>L)   4. L1-2 disc extrusion (Right)   5. Lumbar spondylosis    Pain Score: Pre-procedure: 3 /10 Post-procedure: 0-No pain/10  Pre-op Assessment:  Karen Edwards is a 52 y.o. (year old), female patient, seen today for interventional treatment. She  has a past surgical history that includes Cesarean section; Ankle surgery (Right, 03/02/2014); Hemorrhoid surgery (N/A, 06/16/2015); Cervical spine surgery (02/21/2016); Cesarean section with bilateral tubal ligation (1990); Colonoscopy (04/19/2015); and Hysteroscopy w/D&C (12/24/2013). Karen Edwards has a current medication list which includes  the following prescription(s): cyanocobalamin, fluoxetine, green tea (camillia sinensis), hydrocodone-acetaminophen, hydrocodone-acetaminophen, methocarbamol, oxybutynin, potassium, trazodone, and linaclotide, and the following Facility-Administered Medications: fentanyl, lactated ringers, and midazolam. Her primarily concern today is the Back Pain (Lower Back Right side) and Neck Pain (Bilateral)  Initial Vital Signs:  Pulse Rate: 71 Temp: 98.5 F (36.9 C) Resp: 16 BP: 120/82 SpO2: 98 %  BMI: Estimated body mass index is 26.45 kg/m as calculated from the following:   Height as of this encounter: 5\' 1"  (1.549 m).   Weight as of this encounter: 140 lb (63.5 kg).  Risk Assessment: Allergies: Reviewed. She is allergic to no known allergies.  Allergy Precautions: None required Coagulopathies: Reviewed. None identified.  Blood-thinner therapy: None at this time Active Infection(s): Reviewed. None identified. Karen Edwards is afebrile  Site Confirmation: Karen Edwards was asked to confirm the procedure and laterality before marking the site Procedure checklist: Completed Consent: Before the procedure and under the influence of no sedative(s), amnesic(s), or anxiolytics, the patient was informed of the treatment options, risks and possible complications. To fulfill our ethical and legal obligations, as recommended by the American Medical Association's Code of Ethics, I have informed the patient of my clinical impression; the nature and purpose of the treatment or procedure; the risks, benefits, and possible complications of the intervention; the alternatives, including doing nothing; the risk(s) and benefit(s) of the alternative treatment(s) or procedure(s); and the risk(s) and benefit(s) of doing nothing. The patient was provided information about the general risks and possible complications associated with the procedure. These may include, but are not limited to: failure to achieve desired goals,  infection, bleeding, organ or nerve damage, allergic reactions, paralysis, and death. In addition, the patient was informed of those risks and complications associated to [facility]  procedures, such as failure to decrease pain; infection (i.e.: Meningitis, epidural or intraspinal abscess); bleeding (i.e.: epidural hematoma, subarachnoid hemorrhage, or any other type of intraspinal or peri-dural bleeding); organ or nerve damage (i.e.: Any type of peripheral nerve, nerve root, or spinal cord injury) with subsequent damage to sensory, motor, and/or autonomic systems, resulting in permanent pain, numbness, and/or weakness of one or several areas of the body; allergic reactions; (i.e.: anaphylactic reaction); and/or death. Furthermore, the patient was informed of those risks and complications associated with the medications. These include, but are not limited to: allergic reactions (i.e.: anaphylactic or anaphylactoid reaction(s)); adrenal axis suppression; blood sugar elevation that in diabetics may result in ketoacidosis or comma; water retention that in patients with history of congestive heart failure may result in shortness of breath, pulmonary edema, and decompensation with resultant heart failure; weight gain; swelling or edema; medication-induced neural toxicity; particulate matter embolism and blood vessel occlusion with resultant organ, and/or nervous system infarction; and/or aseptic necrosis of one or more joints. Finally, the patient was informed that Medicine is not an exact science; therefore, there is also the possibility of unforeseen or unpredictable risks and/or possible complications that may result in a catastrophic outcome. The patient indicated having understood very clearly. We have given the patient no guarantees and we have made no promises. Enough time was given to the patient to ask questions, all of which were answered to the patient's satisfaction. Karen Edwards has indicated that she  wanted to continue with the procedure. Attestation: I, the ordering provider, attest that I have discussed with the patient the benefits, risks, side-effects, alternatives, likelihood of achieving goals, and potential problems during recovery for the procedure that I have provided informed consent. Date  Time: 02/10/2018  1:03 PM  Pre-Procedure Preparation:  Monitoring: As per clinic protocol. Respiration, ETCO2, SpO2, BP, heart rate and rhythm monitor placed and checked for adequate function Safety Precautions: Patient was assessed for positional comfort and pressure points before starting the procedure. Time-out: I initiated and conducted the "Time-out" before starting the procedure, as per protocol. The patient was asked to participate by confirming the accuracy of the "Time Out" information. Verification of the correct person, site, and procedure were performed and confirmed by me, the nursing staff, and the patient. "Time-out" conducted as per Joint Commission's Universal Protocol (UP.01.01.01). Time: 1415  Description of Procedure #1 Process:   Target Area: The  interlaminar space, initially targeting the lower border of the superior vertebral body lamina. Approach: Posterior paramedial approach. Area Prepped: Entire Posterior Lumbosacral Region Prepping solution: ChloraPrep (2% chlorhexidine gluconate and 70% isopropyl alcohol) Safety Precautions: Aspiration looking for blood return was conducted prior to all injections. At no point did we inject any substances, as a needle was being advanced. No attempts were made at seeking any paresthesias. Safe injection practices and needle disposal techniques used. Medications properly checked for expiration dates. SDV (single dose vial) medications used. Description of the Procedure: Protocol guidelines were followed. The patient was placed in position over the fluoroscopy table. The target area was identified and the area prepped in the usual manner.  Skin desensitized using vapocoolant spray. Skin & deeper tissues infiltrated with local anesthetic. Appropriate amount of time allowed to pass for local anesthetics to take effect. The procedure needle was introduced through the skin, ipsilateral to the reported pain, and advanced to the target area. Bone was contacted and the needle walked caudad, until the lamina was cleared. The ligamentum flavum was engaged and loss-of-resistance technique used  as the epidural needle was advanced. The epidural space was identified using "loss-of-resistance technique" with 2-3 ml of PF-NaCl (0.9% NSS), in a 5cc LOR glass syringe. Proper needle placement secured. Negative aspiration confirmed. Solution injected in intermittent fashion, asking for systemic symptoms every 0.5cc of injectate. The needles were then removed and the area cleansed, making sure to leave some of the prepping solution back to take advantage of its long term bactericidal properties. Start Time: 1415 hrs. Materials:  Needle(s) Type: Epidural needle Gauge: 17G Length: 3.5-in Medication(s): Please see orders for medications and dosing details.  Description of Procedure #2 Process:   Target Area: The inferior and lateral portion of the pedicle, just lateral to a line created by the 6:00 position of the pedicle and the superior articular process of the vertebral body below. On the lateral view, this target lies just posterior to the anterior aspect of the lamina and posterior to the midpoint created between the anterior and the posterior aspect of the neural foramina. Approach: Posterior paravertebral approach. Area Prepped: Same as above Prepping solution: Same as above Safety Precautions: Same as above Description of the Procedure: Protocol guidelines were followed. The patient was placed in position over the fluoroscopy table. The target area was identified and the area prepped in the usual manner. Skin desensitized using vapocoolant spray. Skin  & deeper tissues infiltrated with local anesthetic. Appropriate amount of time allowed to pass for local anesthetics to take effect. The procedure needles were then advanced to the target area. Proper needle placement secured. Negative aspiration confirmed. Solution injected in intermittent fashion, asking for systemic symptoms every 0.2cc of injectate. The needles were then removed and the area cleansed, making sure to leave some of the prepping solution back to take advantage of its long term bactericidal properties. Vitals:   02/10/18 1426 02/10/18 1436 02/10/18 1445 02/10/18 1455  BP: 124/70 124/66 122/64 132/79  Pulse:      Resp: 12 12 14 16   Temp:      TempSrc:      SpO2: 98% 96% 94% 98%  Weight:      Height:        End Time: 1424 hrs. Materials:  Needle(s) Type: Regular needle Gauge: 22G Length: 3.5-in Medication(s): Please see orders for medications and dosing details.  Imaging Guidance (Spinal):  Type of Imaging Technique: Fluoroscopy Guidance (Spinal) Indication(s): Assistance in needle guidance and placement for procedures requiring needle placement in or near specific anatomical locations not easily accessible without such assistance. Exposure Time: Please see nurses notes. Contrast: Before injecting any contrast, we confirmed that the patient did not have an allergy to iodine, shellfish, or radiological contrast. Once satisfactory needle placement was completed at the desired level, radiological contrast was injected. Contrast injected under live fluoroscopy. No contrast complications. See chart for type and volume of contrast used. Fluoroscopic Guidance: I was personally present during the use of fluoroscopy. "Tunnel Vision Technique" used to obtain the best possible view of the target area. Parallax error corrected before commencing the procedure. "Direction-depth-direction" technique used to introduce the needle under continuous pulsed fluoroscopy. Once target was reached,  antero-posterior, oblique, and lateral fluoroscopic projection used confirm needle placement in all planes. Images permanently stored in EMR. Interpretation: I personally interpreted the imaging intraoperatively. Adequate needle placement confirmed in multiple planes. Appropriate spread of contrast into desired area was observed. No evidence of afferent or efferent intravascular uptake. No intrathecal or subarachnoid spread observed. Permanent images saved into the patient's record.  Antibiotic Prophylaxis:  Anti-infectives (From admission, onward)   None     Indication(s): None identified  Post-operative Assessment:  Post-procedure Vital Signs:  Pulse Rate: 71 Temp: 98.5 F (36.9 C) Resp: 16 BP: 132/79 SpO2: 98 %  EBL: None  Complications: No immediate post-treatment complications observed by team, or reported by patient.  Note: The patient tolerated the entire procedure well. A repeat set of vitals were taken after the procedure and the patient was kept under observation following institutional policy, for this type of procedure. Post-procedural neurological assessment was performed, showing return to baseline, prior to discharge. The patient was provided with post-procedure discharge instructions, including a section on how to identify potential problems. Should any problems arise concerning this procedure, the patient was given instructions to immediately contact us, at any time, without hesitation. In any case, we plan to contact the patient by telephone for a follow-up status report regarding this interventional procedure.  Comments:  No additional relevant information.  Plan of Care    Imaging Orders     DG C-Arm 1-60 Min-No Report  Procedure Orders     Lumbar Epidural Injection     Lumbar Transforaminal Epidural  Medications ordered for procedure: Meds ordered this encounter  Medications  . iopamidol (ISOVUE-M) 41 % intrathecal injection 10 mL    Must be  Myelogram-compatible. If not available, you may substitute with a water-soluble, non-ionic, hypoallergenic, myelogram-compatible radiological contrast medium.  Marland Kitchen lidocaine (XYLOCAINE) 2 % (with pres) injection 400 mg  . midazolam (VERSED) 5 MG/5ML injection 1-2 mg    Make sure Flumazenil is available in the pyxis when using this medication. If oversedation occurs, administer 0.2 mg IV over 15 sec. If after 45 sec no response, administer 0.2 mg again over 1 min; may repeat at 1 min intervals; not to exceed 4 doses (1 mg)  . fentaNYL (SUBLIMAZE) injection 25-50 mcg    Make sure Narcan is available in the pyxis when using this medication. In the event of respiratory depression (RR< 8/min): Titrate NARCAN (naloxone) in increments of 0.1 to 0.2 mg IV at 2-3 minute intervals, until desired degree of reversal.  . lactated ringers infusion 1,000 mL  . sodium chloride flush (NS) 0.9 % injection 1 mL  . ropivacaine (PF) 2 mg/mL (0.2%) (NAROPIN) injection 1 mL  . dexamethasone (DECADRON) injection 10 mg  . sodium chloride flush (NS) 0.9 % injection 2 mL  . ropivacaine (PF) 2 mg/mL (0.2%) (NAROPIN) injection 2 mL  . triamcinolone acetonide (KENALOG-40) injection 40 mg   Medications administered: We administered iopamidol, lidocaine, midazolam, fentaNYL, lactated ringers, sodium chloride flush, ropivacaine (PF) 2 mg/mL (0.2%), dexamethasone, sodium chloride flush, ropivacaine (PF) 2 mg/mL (0.2%), and triamcinolone acetonide.  See the medical record for exact dosing, route, and time of administration.  New Prescriptions   No medications on file   Disposition: Discharge home  Discharge Date & Time: 02/10/2018; 1500 hrs.   Physician-requested Follow-up: Return for post-procedure eval (2 wks), w/ Dr. Dossie Arbour.  Future Appointments  Date Time Provider [place]  03/09/2018  1:15 PM Milinda Pointer, MD [facility] None   Primary Care Physician: Pleas Koch, NP Location: [facility] Note by: Gaspar Cola, MD Date: 02/10/2018; Time: 3:01 PM  Disclaimer:  Medicine is not an Chief Strategy Officer. The only guarantee in medicine is that nothing is guaranteed. It is important to note that the decision to proceed with this intervention was based on the information collected from the patient. The Data and conclusions  were drawn from the patient's questionnaire, the interview, and the physical examination. Because the information was provided in large part by the patient, it cannot be guaranteed that it has not been purposely or unconsciously manipulated. Every effort has been made to obtain as much relevant data as possible for this evaluation. It is important to note that the conclusions that lead to this procedure are derived in large part from the available data. Always take into account that the treatment will also be dependent on availability of resources and existing treatment guidelines, considered by other Pain Management Practitioners as being common knowledge and practice, at the time of the intervention. For Medico-Legal purposes, it is also important to point out that variation in procedural techniques and pharmacological choices are the acceptable norm. The indications, contraindications, technique, and results of the above procedure should only be interpreted and judged by a Board-Certified Interventional Pain Specialist with extensive familiarity and expertise in the same exact procedure and technique.

## 2018-02-10 NOTE — Patient Instructions (Addendum)

## 2018-02-11 ENCOUNTER — Telehealth: Payer: Self-pay | Admitting: *Deleted

## 2018-02-11 NOTE — Telephone Encounter (Signed)
Denies complications post procedure. 

## 2018-02-16 ENCOUNTER — Other Ambulatory Visit: Payer: Self-pay | Admitting: Nurse Practitioner

## 2018-02-19 ENCOUNTER — Other Ambulatory Visit: Payer: Self-pay | Admitting: Nurse Practitioner

## 2018-02-19 ENCOUNTER — Telehealth: Payer: Self-pay | Admitting: Nurse Practitioner

## 2018-02-19 DIAGNOSIS — M545 Low back pain: Secondary | ICD-10-CM | POA: Diagnosis not present

## 2018-02-19 MED ORDER — METHOCARBAMOL 500 MG PO TABS: 500.0000 mg | ORAL_TABLET | Freq: Three times a day (TID) | ORAL | 1 refills | Status: DC | PRN

## 2018-02-19 NOTE — Telephone Encounter (Signed)
No answer.Left voicemail for patient to return call. 

## 2018-02-19 NOTE — Telephone Encounter (Signed)
Patient states she did not get muscle relaxer scripts and pharmacy does not have any on file. Please call patient to discuss. Also last visit instructions say to schedule patient 2 months, she only got one script, when should we schedule patient

## 2018-02-20 NOTE — Telephone Encounter (Signed)
Patient notified that Robaxin has been e scribed to pharmacy.  She states she is good with her Hydrocodone.  Phone call sent to front desk to schedule a med refill appointment.

## 2018-03-09 ENCOUNTER — Encounter: Payer: Self-pay | Admitting: Pain Medicine

## 2018-03-09 ENCOUNTER — Other Ambulatory Visit: Payer: Self-pay

## 2018-03-09 ENCOUNTER — Ambulatory Visit: Payer: PPO | Attending: Pain Medicine | Admitting: Pain Medicine

## 2018-03-09 VITALS — BP 125/76 | HR 58 | Temp 98.3°F | Resp 16 | Ht 61.0 in | Wt 140.0 lb

## 2018-03-09 DIAGNOSIS — G8929 Other chronic pain: Secondary | ICD-10-CM | POA: Diagnosis not present

## 2018-03-09 DIAGNOSIS — M47812 Spondylosis without myelopathy or radiculopathy, cervical region: Secondary | ICD-10-CM | POA: Diagnosis not present

## 2018-03-09 DIAGNOSIS — M5481 Occipital neuralgia: Secondary | ICD-10-CM | POA: Diagnosis not present

## 2018-03-09 DIAGNOSIS — I341 Nonrheumatic mitral (valve) prolapse: Secondary | ICD-10-CM | POA: Diagnosis not present

## 2018-03-09 DIAGNOSIS — M5416 Radiculopathy, lumbar region: Secondary | ICD-10-CM

## 2018-03-09 DIAGNOSIS — M5010 Cervical disc disorder with radiculopathy, unspecified cervical region: Secondary | ICD-10-CM | POA: Insufficient documentation

## 2018-03-09 DIAGNOSIS — M5441 Lumbago with sciatica, right side: Secondary | ICD-10-CM | POA: Diagnosis not present

## 2018-03-09 DIAGNOSIS — Z79891 Long term (current) use of opiate analgesic: Secondary | ICD-10-CM | POA: Insufficient documentation

## 2018-03-09 DIAGNOSIS — E785 Hyperlipidemia, unspecified: Secondary | ICD-10-CM | POA: Diagnosis not present

## 2018-03-09 DIAGNOSIS — G894 Chronic pain syndrome: Secondary | ICD-10-CM | POA: Diagnosis not present

## 2018-03-09 DIAGNOSIS — M5030 Other cervical disc degeneration, unspecified cervical region: Secondary | ICD-10-CM | POA: Diagnosis not present

## 2018-03-09 DIAGNOSIS — F17210 Nicotine dependence, cigarettes, uncomplicated: Secondary | ICD-10-CM | POA: Insufficient documentation

## 2018-03-09 DIAGNOSIS — F419 Anxiety disorder, unspecified: Secondary | ICD-10-CM | POA: Insufficient documentation

## 2018-03-09 DIAGNOSIS — K581 Irritable bowel syndrome with constipation: Secondary | ICD-10-CM | POA: Insufficient documentation

## 2018-03-09 DIAGNOSIS — Z79899 Other long term (current) drug therapy: Secondary | ICD-10-CM | POA: Insufficient documentation

## 2018-03-09 DIAGNOSIS — M5442 Lumbago with sciatica, left side: Secondary | ICD-10-CM | POA: Diagnosis not present

## 2018-03-09 DIAGNOSIS — K219 Gastro-esophageal reflux disease without esophagitis: Secondary | ICD-10-CM | POA: Diagnosis not present

## 2018-03-09 DIAGNOSIS — M5137 Other intervertebral disc degeneration, lumbosacral region: Secondary | ICD-10-CM | POA: Insufficient documentation

## 2018-03-09 DIAGNOSIS — M5116 Intervertebral disc disorders with radiculopathy, lumbar region: Secondary | ICD-10-CM | POA: Diagnosis not present

## 2018-03-09 DIAGNOSIS — G4700 Insomnia, unspecified: Secondary | ICD-10-CM | POA: Diagnosis not present

## 2018-03-09 DIAGNOSIS — F329 Major depressive disorder, single episode, unspecified: Secondary | ICD-10-CM | POA: Insufficient documentation

## 2018-03-09 DIAGNOSIS — M542 Cervicalgia: Secondary | ICD-10-CM | POA: Diagnosis not present

## 2018-03-09 NOTE — Patient Instructions (Addendum)
____________________________________________________________________________________________  Preparing for Procedure with Sedation  Instructions: . Oral Intake: Do not eat or drink anything for at least 8 hours prior to your procedure. . Transportation: Public transportation is not allowed. Bring an adult driver. The driver must be physically present in our waiting room before any procedure can be started. Marland Kitchen Physical Assistance: Bring an adult physically capable of assisting you, in the event you need help. This adult should keep you company at home for at least 6 hours after the procedure. . Blood Pressure Medicine: Take your blood pressure medicine with a sip of water the morning of the procedure. . Blood thinners:  . Diabetics on insulin: Notify the staff so that you can be scheduled 1st case in the morning. If your diabetes requires high dose insulin, take only  of your normal insulin dose the morning of the procedure and notify the staff that you have done so. . Preventing infections: Shower with an antibacterial soap the morning of your procedure. . Build-up your immune system: Take 1000 mg of Vitamin C with every meal (3 times a day) the day prior to your procedure. Marland Kitchen Antibiotics: Inform the staff if you have a condition or reason that requires you to take antibiotics before dental procedures. . Pregnancy: If you are pregnant, call and cancel the procedure. . Sickness: If you have a cold, fever, or any active infections, call and cancel the procedure. . Arrival: You must be in the facility at least 30 minutes prior to your scheduled procedure. . Children: Do not bring children with you. . Dress appropriately: Bring dark clothing that you would not mind if they get stained. . Valuables: Do not bring any jewelry or valuables.  Procedure appointments are reserved for interventional treatments only. Marland Kitchen No Prescription Refills. . No medication changes will be discussed during procedure  appointments. . No disability issues will be discussed.  Remember:  Regular Business hours are:  Monday to Thursday 8:00 AM to 4:00 PM  Provider's Schedule: Milinda Pointer, MD:  Procedure days: Tuesday and Thursday 7:30 AM to 4:00 PM  Gillis Santa, MD:  Procedure days: Monday and Wednesday 7:30 AM to 4:00 PM ____________________________________________________________________________________________   GENERAL RISKS AND COMPLICATIONS  What are the risk, side effects and possible complications? Generally speaking, most procedures are safe.  However, with any procedure there are risks, side effects, and the possibility of complications.  The risks and complications are dependent upon the sites that are lesioned, or the type of nerve block to be performed.  The closer the procedure is to the spine, the more serious the risks are.  Great care is taken when placing the radio frequency needles, block needles or lesioning probes, but sometimes complications can occur. 1. Infection: Any time there is an injection through the skin, there is a risk of infection.  This is why sterile conditions are used for these blocks.  There are four possible types of infection. 1. Localized skin infection. 2. Central Nervous System Infection-This can be in the form of Meningitis, which can be deadly. 3. Epidural Infections-This can be in the form of an epidural abscess, which can cause pressure inside of the spine, causing compression of the spinal cord with subsequent paralysis. This would require an emergency surgery to decompress, and there are no guarantees that the patient would recover from the paralysis. 4. Discitis-This is an infection of the intervertebral discs.  It occurs in about 1% of discography procedures.  It is difficult to treat and it may  lead to surgery.        2. Pain: the needles have to go through skin and soft tissues, will cause soreness.       3. Damage to internal structures:  The  nerves to be lesioned may be near blood vessels or    other nerves which can be potentially damaged.       4. Bleeding: Bleeding is more common if the patient is taking blood thinners such as  aspirin, Coumadin, Ticiid, Plavix, etc., or if he/she have some genetic predisposition  such as hemophilia. Bleeding into the spinal canal can cause compression of the spinal  cord with subsequent paralysis.  This would require an emergency surgery to  decompress and there are no guarantees that the patient would recover from the  paralysis.       5. Pneumothorax:  Puncturing of a lung is a possibility, every time a needle is introduced in  the area of the chest or upper back.  Pneumothorax refers to free air around the  collapsed lung(s), inside of the thoracic cavity (chest cavity).  Another two possible  complications related to a similar event would include: Hemothorax and Chylothorax.   These are variations of the Pneumothorax, where instead of air around the collapsed  lung(s), you may have blood or chyle, respectively.       6. Spinal headaches: They may occur with any procedures in the area of the spine.       7. Persistent CSF (Cerebro-Spinal Fluid) leakage: This is a rare problem, but may occur  with prolonged intrathecal or epidural catheters either due to the formation of a fistulous  track or a dural tear.       8. Nerve damage: By working so close to the spinal cord, there is always a possibility of  nerve damage, which could be as serious as a permanent spinal cord injury with  paralysis.       9. Death:  Although rare, severe deadly allergic reactions known as "Anaphylactic  reaction" can occur to any of the medications used.      10. Worsening of the symptoms:  We can always make thing worse.  What are the chances of something like this happening? Chances of any of this occuring are extremely low.  By statistics, you have more of a chance of getting killed in a motor vehicle accident: while driving  to the hospital than any of the above occurring .  Nevertheless, you should be aware that they are possibilities.  In general, it is similar to taking a shower.  Everybody knows that you can slip, hit your head and get killed.  Does that mean that you should not shower again?  Nevertheless always keep in mind that statistics do not mean anything if you happen to be on the wrong side of them.  Even if a procedure has a 1 (one) in a 1,000,000 (million) chance of going wrong, it you happen to be that one..Also, keep in mind that by statistics, you have more of a chance of having something go wrong when taking medications.  Who should not have this procedure? If you are on a blood thinning medication (e.g. Coumadin, Plavix, see list of "Blood Thinners"), or if you have an active infection going on, you should not have the procedure.  If you are taking any blood thinners, please inform your physician.  How should I prepare for this procedure?  Do not eat or drink anything at least  six hours prior to the procedure.  Bring a driver with you .  It cannot be a taxi.  Come accompanied by an adult that can drive you back, and that is strong enough to help you if your legs get weak or numb from the local anesthetic.  Take all of your medicines the morning of the procedure with just enough water to swallow them.  If you have diabetes, make sure that you are scheduled to have your procedure done first thing in the morning, whenever possible.  If you have diabetes, take only half of your insulin dose and notify our nurse that you have done so as soon as you arrive at the clinic.  If you are diabetic, but only take blood sugar pills (oral hypoglycemic), then do not take them on the morning of your procedure.  You may take them after you have had the procedure.  Do not take aspirin or any aspirin-containing medications, at least eleven (11) days prior to the procedure.  They may prolong bleeding.  Wear loose  fitting clothing that may be easy to take off and that you would not mind if it got stained with Betadine or blood.  Do not wear any jewelry or perfume  Remove any nail coloring.  It will interfere with some of our monitoring equipment.  NOTE: Remember that this is not meant to be interpreted as a complete list of all possible complications.  Unforeseen problems may occur.  BLOOD THINNERS The following drugs contain aspirin or other products, which can cause increased bleeding during surgery and should not be taken for 2 weeks prior to and 1 week after surgery.  If you should need take something for relief of minor pain, you may take acetaminophen which is found in Tylenol,m Datril, Anacin-3 and Panadol. It is not blood thinner. The products listed below are.  Do not take any of the products listed below in addition to any listed on your instruction sheet.  A.P.C or A.P.C with Codeine Codeine Phosphate Capsules #3 Ibuprofen Ridaura  ABC compound Congesprin Imuran rimadil  Advil Cope Indocin Robaxisal  Alka-Seltzer Effervescent Pain Reliever and Antacid Coricidin or Coricidin-D  Indomethacin Rufen  Alka-Seltzer plus Cold Medicine Cosprin Ketoprofen S-A-C Tablets  Anacin Analgesic Tablets or Capsules Coumadin Korlgesic Salflex  Anacin Extra Strength Analgesic tablets or capsules CP-2 Tablets Lanoril Salicylate  Anaprox Cuprimine Capsules Levenox Salocol  Anexsia-D Dalteparin Magan Salsalate  Anodynos Darvon compound Magnesium Salicylate Sine-off  Ansaid Dasin Capsules Magsal Sodium Salicylate  Anturane Depen Capsules Marnal Soma  APF Arthritis pain formula Dewitt's Pills Measurin Stanback  Argesic Dia-Gesic Meclofenamic Sulfinpyrazone  Arthritis Bayer Timed Release Aspirin Diclofenac Meclomen Sulindac  Arthritis pain formula Anacin Dicumarol Medipren Supac  Analgesic (Safety coated) Arthralgen Diffunasal Mefanamic Suprofen  Arthritis Strength Bufferin Dihydrocodeine Mepro Compound Suprol   Arthropan liquid Dopirydamole Methcarbomol with Aspirin Synalgos  ASA tablets/Enseals Disalcid Micrainin Tagament  Ascriptin Doan's Midol Talwin  Ascriptin A/D Dolene Mobidin Tanderil  Ascriptin Extra Strength Dolobid Moblgesic Ticlid  Ascriptin with Codeine Doloprin or Doloprin with Codeine Momentum Tolectin  Asperbuf Duoprin Mono-gesic Trendar  Aspergum Duradyne Motrin or Motrin IB Triminicin  Aspirin plain, buffered or enteric coated Durasal Myochrisine Trigesic  Aspirin Suppositories Easprin Nalfon Trillsate  Aspirin with Codeine Ecotrin Regular or Extra Strength Naprosyn Uracel  Atromid-S Efficin Naproxen Ursinus  Auranofin Capsules Elmiron Neocylate Vanquish  Axotal Emagrin Norgesic Verin  Azathioprine Empirin or Empirin with Codeine Normiflo Vitamin E  Azolid Emprazil Nuprin Voltaren  Bayer Aspirin  plain, buffered or children's or timed BC Tablets or powders Encaprin Orgaran Warfarin Sodium  Buff-a-Comp Enoxaparin Orudis Zorpin  Buff-a-Comp with Codeine Equegesic Os-Cal-Gesic   Buffaprin Excedrin plain, buffered or Extra Strength Oxalid   Bufferin Arthritis Strength Feldene Oxphenbutazone   Bufferin plain or Extra Strength Feldene Capsules Oxycodone with Aspirin   Bufferin with Codeine Fenoprofen Fenoprofen Pabalate or Pabalate-SF   Buffets II Flogesic Panagesic   Buffinol plain or Extra Strength Florinal or Florinal with Codeine Panwarfarin   Buf-Tabs Flurbiprofen Penicillamine   Butalbital Compound Four-way cold tablets Penicillin   Butazolidin Fragmin Pepto-Bismol   Carbenicillin Geminisyn Percodan   Carna Arthritis Reliever Geopen Persantine   Carprofen Gold's salt Persistin   Chloramphenicol Goody's Phenylbutazone   Chloromycetin Haltrain Piroxlcam   Clmetidine heparin Plaquenil   Cllnoril Hyco-pap Ponstel   Clofibrate Hydroxy chloroquine Propoxyphen         Before stopping any of these medications, be sure to consult the physician who ordered them.  Some, such as  Coumadin (Warfarin) are ordered to prevent or treat serious conditions such as "deep thrombosis", "pumonary embolisms", and other heart problems.  The amount of time that you may need off of the medication may also vary with the medication and the reason for which you were taking it.  If you are taking any of these medications, please make sure you notify your pain physician before you undergo any procedures.         Facet Blocks Patient Information  Description: The facets are joints in the spine between the vertebrae.  Like any joints in the body, facets can become irritated and painful.  Arthritis can also effect the facets.  By injecting steroids and local anesthetic in and around these joints, we can temporarily block the nerve supply to them.  Steroids act directly on irritated nerves and tissues to reduce selling and inflammation which often leads to decreased pain.  Facet blocks may be done anywhere along the spine from the neck to the low back depending upon the location of your pain.   After numbing the skin with local anesthetic (like Novocaine), a small needle is passed onto the facet joints under x-ray guidance.  You may experience a sensation of pressure while this is being done.  The entire block usually lasts about 15-25 minutes.   Conditions which may be treated by facet blocks:   Low back/buttock pain  Neck/shoulder pain  Certain types of headaches  Preparation for the injection:  1. Do not eat any solid food or dairy products within 8 hours of your appointment. 2. You may drink clear liquid up to 3 hours before appointment.  Clear liquids include water, black coffee, juice or soda.  No milk or cream please. 3. You may take your regular medication, including pain medications, with a sip of water before your appointment.  Diabetics should hold regular insulin (if taken separately) and take 1/2 normal NPH dose the morning of the procedure.  Carry some sugar containing  items with you to your appointment. 4. A driver must accompany you and be prepared to drive you home after your procedure. 5. Bring all your current medications with you. 6. An IV may be inserted and sedation may be given at the discretion of the physician. 7. A blood pressure cuff, EKG and other monitors will often be applied during the procedure.  Some patients may need to have extra oxygen administered for a short period. 8. You will be asked to provide medical information,  including your allergies and medications, prior to the procedure.  We must know immediately if you are taking blood thinners (like Coumadin/Warfarin) or if you are allergic to IV iodine contrast (dye).  We must know if you could possible be pregnant.  Possible side-effects:   Bleeding from needle site  Infection (rare, may require surgery)  Nerve injury (rare)  Numbness & tingling (temporary)  Difficulty urinating (rare, temporary)  Spinal headache (a headache worse with upright posture)  Light-headedness (temporary)  Pain at injection site (serveral days)  Decreased blood pressure (rare, temporary)  Weakness in arm/leg (temporary)  Pressure sensation in back/neck (temporary)   Call if you experience:   Fever/chills associated with headache or increased back/neck pain  Headache worsened by an upright position  New onset, weakness or numbness of an extremity below the injection site  Hives or difficulty breathing (go to the emergency room)  Inflammation or drainage at the injection site(s)  Severe back/neck pain greater than usual  New symptoms which are concerning to you  Please note:  Although the local anesthetic injected can often make your back or neck feel good for several hours after the injection, the pain will likely return. It takes 3-7 days for steroids to work.  You may not notice any pain relief for at least one week.  If effective, we will often do a series of 2-3 injections  spaced 3-6 weeks apart to maximally decrease your pain.  After the initial series, you may be a candidate for a more permanent nerve block of the facets.  If you have any questions, please call #[phone]) [facility]

## 2018-03-09 NOTE — Progress Notes (Signed)
Safety precautions to be maintained throughout the outpatient stay will include: orient to surroundings, keep bed in low position, maintain call bell within reach at all times, provide assistance with transfer out of bed and ambulation.  

## 2018-03-09 NOTE — Progress Notes (Signed)
Patient's Name: Karen Edwards  MRN: [id number]  Referring Provider: Pleas Koch, NP  DOB: 06-04-1966  PCP: Pleas Koch, NP  DOS: 03/09/2018  Note by: Gaspar Cola, MD  Service setting: Ambulatory outpatient  Specialty: Interventional Pain Management  Location: [facility] (AMB) Pain Management Facility    Patient type: Established   Primary Reason(s) for Visit: Encounter for post-procedure evaluation of chronic illness with mild to moderate exacerbation CC: Back Pain (lower)  HPI  Karen Edwards is a 52 y.o. year old, female patient, who comes today for a post-procedure evaluation. She has Chronic low back pain (Primary Area of Pain) (Bilateral) (R>L); Menorrhagia; Fatigue; DDD (degenerative disc disease), lumbar; DDD (degenerative disc disease), cervical; Chronic occipital neuralgia (Fifth Area of Pain) (Bilateral) (R>L); Lumbar facet syndrome (Bilateral) (L>R); DDD (degenerative disc disease), lumbosacral; Lumbar radicular pain (Right) (L4); Sacroiliac joint dysfunction (Bilateral); Depression; Rectal bleeding; Insomnia; Chronic constipation; Pharmacologic therapy; Hyperlipidemia, unspecified; Chronic neck pain (Fourth Area of Pain) (Bilateral) (R>L); GERD (gastroesophageal reflux disease); Numbness and tingling of upper extremity (Right); Chronic upper extremity pain (Bilateral) (R>L); Chronic sacroiliac joint pain (Bilateral) (L>R); Lumbar facet hypertrophy (Bilateral); L1-2 disc extrusion (Right); Cervical foraminal stenosis (C5-6) (Bilateral); Long term prescription opiate use; Opiate use; Disorder of skeletal system; Problems influencing health status; Chronic pain syndrome; Chronic cervical radicular pain (Right: C6/C7) (Left: C5/T1); Chronic lumbar radicular pain; Chronic hip pain (Tertiary Area of Pain) (Bilateral) (R>L); Chronic lower extremity pain (Secondary Area of Pain) (Bilateral) (R>L); Chronic shoulder pain (Bilateral) (L>R); Chronic musculoskeletal pain; Lumbar spondylosis;  Stress incontinence of urine; Cervical facet syndrome; Myofascial pain; and Spondylosis without myelopathy or radiculopathy, cervical region on their problem list. Her primarily concern today is the Back Pain (lower)  Pain Assessment: Location: Right, Left, Lower Back Radiating: down the backs of both legs to knee area, worse on right Onset: More than a month ago Quality: Constant, Burning, Sharp, Shooting, Stabbing Severity: 3 /10 (subjective, self-reported pain score)  Note: Reported level is compatible with observation.                         When using our objective Pain Scale, levels between 6 and 10/10 are said to belong in an emergency room, as it progressively worsens from a 6/10, described as severely limiting, requiring emergency care not usually available at an outpatient pain management facility. At a 6/10 level, communication becomes difficult and requires great effort. Assistance to reach the emergency department may be required. Facial flushing and profuse sweating along with potentially dangerous increases in heart rate and blood pressure will be evident. Effect on ADL: limited mobility, making it difficult to do much of anything, doesn't leave the house as often as she'd like to Timing: Constant Modifying factors: changing positions, hot baths, medications BP: 125/76  HR: (!) 58  Karen Edwards comes in today for post-procedure evaluation after the treatment done on 02/10/2018. The patient did get good relief of the pain with the diagnostic right-sided L1-2 interlaminar LESI + right-sided L1 transforaminal epidural steroid injection under fluoroscopic guidance and IV sedation. Because of this, we will put this as a when necessary order to be repeated if the pain starts coming back again as bad as it was. Meanwhile, the patient had an opportunity to see the surgeon who recommended the possibility of a spinal cord stimulator. Today we have talked to patient about the device and what is  required before the trial. I also spoke to her about  the trial and possible implant. She is interested in pursuing this and therefore today we will go ahead and send her to have her medical psychology evaluation. Meanwhile, she indicates that her neck pain is now worse than her low back pain and she is interested in treatment for that area. She indicates having had 2 prior cervical spine surgeries and continued to have pain in the neck and shoulder area. Her pain is primarily axial and she complains of a lot of "cracking and popping" when she moves her neck around. She also indicates that she recently fell and this triggered some headaches in the right side. Physical exam today would suggest the possibility of cervical facet disease and therefore we will go ahead and schedule the patient come back for a diagnostic bilateral cervical facet block under fluoroscopic guidance and IV sedation. Should the patient get good relief, we'll consider then RFA.  Further details on both, my assessment(s), as well as the proposed treatment plan, please see below.  Post-Procedure Assessment  02/10/2018 Procedure: Diagnostic right-sided L1-2 interlaminar LESI #2 + diagnostic right-sided L1 transforaminal LESI #1 under fluoroscopic guidance and IV sedation Pre-procedure pain score:  3/10 Post-procedure pain score: 0/10 (100% relief) Influential Factors: BMI: 26.45 kg/m Intra-procedural challenges: None observed.         Assessment challenges: None detected.              Reported side-effects: None.        Post-procedural adverse reactions or complications: None reported         Sedation: Sedation provided. When no sedatives are used, the analgesic levels obtained are directly associated to the effectiveness of the local anesthetics. However, when sedation is provided, the level of analgesia obtained during the initial 1 hour following the intervention, is believed to be the result of a combination of factors. These  factors may include, but are not limited to: 1. The effectiveness of the local anesthetics used. 2. The effects of the analgesic(s) and/or anxiolytic(s) used. 3. The degree of discomfort experienced by the patient at the time of the procedure. 4. The patients ability and reliability in recalling and recording the events. 5. The presence and influence of possible secondary gains and/or psychosocial factors. Reported result: Relief experienced during the 1st hour after the procedure: 100 % (Ultra-Short Term Relief)            Interpretative annotation: Clinically appropriate result. Analgesia during this period is likely to be Local Anesthetic and/or IV Sedative (Analgesic/Anxiolytic) related.          Effects of local anesthetic: The analgesic effects attained during this period are directly associated to the localized infiltration of local anesthetics and therefore cary significant diagnostic value as to the etiological location, or anatomical origin, of the pain. Expected duration of relief is directly dependent on the pharmacodynamics of the local anesthetic used. Long-acting (4-6 hours) anesthetics used.  Reported result: Relief during the next 4 to 6 hour after the procedure: 100 % (Short-Term Relief)            Interpretative annotation: Clinically appropriate result. Analgesia during this period is likely to be Local Anesthetic-related.          Long-term benefit: Defined as the period of time past the expected duration of local anesthetics (1 hour for short-acting and 4-6 hours for long-acting). With the possible exception of prolonged sympathetic blockade from the local anesthetics, benefits during this period are typically attributed to, or associated with, other factors such  as analgesic sensory neuropraxia, antiinflammatory effects, or beneficial biochemical changes provided by agents other than the local anesthetics.  Reported result: Extended relief following procedure: 70 % (Long-Term  Relief)            Interpretative annotation: Clinically appropriate result. Good relief. No permanent benefit expected. Inflammation plays a part in the etiology to the pain.          Current benefits: Defined as reported results that persistent at this point in time.   Analgesia: 50-75 %            Function: Ms. Rosenburg reports improvement in function ROM: Ms. [place] reports improvement in ROM Interpretative annotation: Ongoing benefit. Therapeutic benefit observed. Effective therapeutic approach.          Interpretation: Results would suggest a successful diagnostic intervention.                  Plan:  Set up procedure as a PRN palliative treatment option for this patient.                Laboratory Chemistry  Inflammation Markers (CRP: Acute Phase) (ESR: Chronic Phase) Lab Results  Component Value Date   CRP 1.4 09/08/2017   ESRSEDRATE 11 09/08/2017                         Renal Function Markers Lab Results  Component Value Date   BUN 7 09/08/2017   CREATININE 0.84 09/08/2017   GFRAA 93 09/08/2017   GFRNONAA 81 09/08/2017                              Hepatic Function Markers Lab Results  Component Value Date   AST 18 09/08/2017   ALT 12 (L) 01/30/2017   ALBUMIN 4.7 09/08/2017   ALKPHOS 73 09/08/2017                        Electrolytes Lab Results  Component Value Date   NA 139 09/08/2017   K 4.4 09/08/2017   CL 99 09/08/2017   CALCIUM 9.8 09/08/2017   MG 2.1 09/08/2017                        Neuropathy Markers Lab Results  Component Value Date   VITAMINB12 >2000 (H) 09/08/2017                        Bone Pathology Markers Lab Results  Component Value Date   VD25OH 43.74 01/10/2016   25OHVITD1 51 09/08/2017   25OHVITD2 <1.0 09/08/2017   25OHVITD3 51 09/08/2017                         Coagulation Parameters Lab Results  Component Value Date   PLT 202.0 05/20/2017                        Cardiovascular Markers Lab Results  Component Value  Date   TROPONINI <0.03 01/30/2017   HGB 13.5 05/20/2017   HCT 40.5 05/20/2017                         Note: Lab results reviewed.  Recent Diagnostic Imaging Results  DG C-Arm 1-60 Min-No Report Fluoroscopy was utilized by the requesting physician.  No radiographic  interpretation.   Complexity Note: I personally reviewed the fluoroscopic imaging of the procedure.                        Meds   Current Outpatient Medications:  .  atorvastatin (LIPITOR) 10 MG tablet, Take 1 tablet (10 mg total) by mouth daily., Disp: 90 tablet, Rfl: 3 .  Cyanocobalamin (B-12 PO), Take 2,500 mcg by mouth every other day., Disp: , Rfl:  .  FLUoxetine (PROZAC) 20 MG capsule, Take 3 capsules (60 mg total) by mouth daily., Disp: 270 capsule, Rfl: 3 .  Green Tea, Camillia sinensis, (GREEN TEA PO), Take 1 capsule by mouth daily., Disp: , Rfl:  .  HYDROcodone-acetaminophen (NORCO/VICODIN) 5-325 MG tablet, Take 1 tablet by mouth 2 (two) times daily., Disp: 60 tablet, Rfl: 0 .  [START ON 03/15/2018] HYDROcodone-acetaminophen (NORCO/VICODIN) 5-325 MG tablet, Take 1 tablet by mouth 2 (two) times daily., Disp: 60 tablet, Rfl: 0 .  methocarbamol (ROBAXIN) 500 MG tablet, Take 1 tablet (500 mg total) by mouth every 8 (eight) hours as needed for muscle spasms., Disp: 90 tablet, Rfl: 1 .  oxybutynin (DITROPAN-XL) 5 MG 24 hr tablet, Take 1 tablet (5 mg total) by mouth at bedtime., Disp: 90 tablet, Rfl: 0 .  Potassium 99 MG TABS, Take 1 tablet by mouth daily., Disp: , Rfl:  .  traZODone (DESYREL) 100 MG tablet, Take 3 tablets (300 mg total) by mouth at bedtime., Disp: 270 tablet, Rfl: 3 .  linaclotide (LINZESS) 145 MCG CAPS capsule, Take 1 capsule (145 mcg total) by mouth daily before breakfast., Disp: 90 capsule, Rfl: 0  ROS  Constitutional: Denies any fever or chills Gastrointestinal: No reported hemesis, hematochezia, vomiting, or acute GI distress Musculoskeletal: Denies any acute onset joint swelling, redness, loss of  ROM, or weakness Neurological: No reported episodes of acute onset apraxia, aphasia, dysarthria, agnosia, amnesia, paralysis, loss of coordination, or loss of consciousness  Allergies  Ms. Liss is allergic to no known allergies.  [place]  Drug: Ms. Mohiuddin  reports that she does not use drugs. Alcohol:  reports that she drinks about 0.6 oz of alcohol per week. Tobacco:  reports that she has been smoking cigarettes.  She has a 3.70 pack-year smoking history. She has never used smokeless tobacco. Medical:  has a past medical history of Anxiety, Chronic back pain, Degenerative joint disease, Depression, Hypotension, IBS (irritable bowel syndrome) (05/2015), and MVP (mitral valve prolapse). Surgical: Ms. Jumper  has a past surgical history that includes Cesarean section; Ankle surgery (Right, 03/02/2014); Hemorrhoid surgery (N/A, 06/16/2015); Cervical spine surgery (02/21/2016); Cesarean section with bilateral tubal ligation (1990); Colonoscopy (04/19/2015); and Hysteroscopy w/D&C (12/24/2013). Family: family history includes Arthritis in her mother; Congestive Heart Failure in her maternal grandfather; Depression in her mother; Diabetes in her maternal aunt; Diabetes Mellitus I in her maternal grandmother; Gout in her maternal aunt; Hearing loss in her father; Heart attack in her maternal grandfather; Hyperlipidemia in her father and mother; Hypertension in her father and mother; Hypothyroidism in her mother; Kidney disease in her mother; Prostate cancer in her paternal grandfather; Stroke in her maternal grandmother; Thyroid disease in her maternal aunt; Vision loss in her mother.  Constitutional Exam  General appearance: Well nourished, well developed, and well hydrated. In no apparent acute distress Vitals:   03/09/18 1316  BP: 125/76  Pulse: (!) 58  Resp: 16  Temp: 98.3 F (36.8 C)  TempSrc: Oral  SpO2: 100%  Weight: 140 lb (63.5 kg)  Height: 5' 1"  (1.549 m)   BMI Assessment: Estimated  body mass index is 26.45 kg/m as calculated from the following:   Height as of this encounter: 5' 1"  (1.549 m).   Weight as of this encounter: 140 lb (63.5 kg).  BMI interpretation table: BMI level Category Range association with higher incidence of chronic pain  <18 kg/m2 Underweight   18.5-24.9 kg/m2 Ideal body weight   25-29.9 kg/m2 Overweight Increased incidence by 20%  30-34.9 kg/m2 Obese (Class I) Increased incidence by 68%  35-39.9 kg/m2 Severe obesity (Class II) Increased incidence by 136%  >40 kg/m2 Extreme obesity (Class III) Increased incidence by 254%   Patient's current BMI Ideal Body weight  Body mass index is 26.45 kg/m. Ideal body weight: 47.8 kg (105 lb 6.1 oz) Adjusted ideal body weight: 54.1 kg (119 lb 3.7 oz)   BMI Readings from Last 4 Encounters:  03/09/18 26.45 kg/m  02/10/18 26.45 kg/m  02/09/18 26.45 kg/m  01/29/18 27.34 kg/m   Wt Readings from Last 4 Encounters:  03/09/18 140 lb (63.5 kg)  02/10/18 140 lb (63.5 kg)  02/09/18 140 lb (63.5 kg)  01/29/18 140 lb (63.5 kg)  Psych/Mental status: Alert, oriented x 3 (person, place, & time)       Eyes: PERLA Respiratory: No evidence of acute respiratory distress  Cervical Spine Area Exam  Skin & Axial Inspection: No masses, redness, edema, swelling, or associated skin lesions Alignment: Symmetrical Functional ROM: Decreased ROM      Stability: No instability detected Muscle Tone/Strength: Functionally intact. No obvious neuro-muscular anomalies detected. Sensory (Neurological): Movement-associated pain Palpation: Complains of area being tender to palpation              Upper Extremity (UE) Exam    Side: Right upper extremity  Side: Left upper extremity  Skin & Extremity Inspection: Skin color, temperature, and hair growth are WNL. No peripheral edema or cyanosis. No masses, redness, swelling, asymmetry, or associated skin lesions. No contractures.  Skin & Extremity Inspection: Skin color, temperature,  and hair growth are WNL. No peripheral edema or cyanosis. No masses, redness, swelling, asymmetry, or associated skin lesions. No contractures.  Functional ROM: Unrestricted ROM          Functional ROM: Unrestricted ROM          Muscle Tone/Strength: Functionally intact. No obvious neuro-muscular anomalies detected.  Muscle Tone/Strength: Functionally intact. No obvious neuro-muscular anomalies detected.  Sensory (Neurological): Unimpaired          Sensory (Neurological): Unimpaired          Palpation: No palpable anomalies              Palpation: No palpable anomalies              Specialized Test(s): Deferred         Specialized Test(s): Deferred          Thoracic Spine Area Exam  Skin & Axial Inspection: No masses, redness, or swelling Alignment: Symmetrical Functional ROM: Unrestricted ROM Stability: No instability detected Muscle Tone/Strength: Functionally intact. No obvious neuro-muscular anomalies detected. Sensory (Neurological): Unimpaired Muscle strength & Tone: No palpable anomalies  Lumbar Spine Area Exam  Skin & Axial Inspection: No masses, redness, or swelling Alignment: Symmetrical Functional ROM: Improved after treatment       Stability: No instability detected Muscle Tone/Strength: Functionally intact. No obvious neuro-muscular anomalies detected. Sensory (Neurological): Unimpaired Palpation: No palpable anomalies  Provocative Tests: Lumbar Hyperextension and rotation test: evaluation deferred today       Lumbar Lateral bending test: evaluation deferred today       Patrick's Maneuver: evaluation deferred today                    Gait & Posture Assessment  Ambulation: Unassisted Gait: Relatively normal for age and body habitus Posture: WNL   Lower Extremity Exam    Side: Right lower extremity  Side: Left lower extremity  Stability: No instability observed          Stability: No instability observed          Skin & Extremity Inspection: Skin color,  temperature, and hair growth are WNL. No peripheral edema or cyanosis. No masses, redness, swelling, asymmetry, or associated skin lesions. No contractures.  Skin & Extremity Inspection: Skin color, temperature, and hair growth are WNL. No peripheral edema or cyanosis. No masses, redness, swelling, asymmetry, or associated skin lesions. No contractures.  Functional ROM: Unrestricted ROM                  Functional ROM: Unrestricted ROM                  Muscle Tone/Strength: Functionally intact. No obvious neuro-muscular anomalies detected.  Muscle Tone/Strength: Functionally intact. No obvious neuro-muscular anomalies detected.  Sensory (Neurological): Unimpaired  Sensory (Neurological): Unimpaired  Palpation: No palpable anomalies  Palpation: No palpable anomalies   Assessment  Primary Diagnosis & Pertinent Problem List: The primary encounter diagnosis was Chronic neck pain (Fourth Area of Pain) (Bilateral) (R>L). Diagnoses of Chronic low back pain (Primary Area of Pain) (Bilateral) (R>L), Chronic occipital neuralgia (Fifth Area of Pain) (Bilateral) (R>L), Spondylosis without myelopathy or radiculopathy, cervical region, Cervical facet syndrome, DDD (degenerative disc disease), lumbosacral, DDD (degenerative disc disease), cervical, Chronic pain syndrome, and Chronic lumbar radicular pain were also pertinent to this visit.  Status Diagnosis  Worsening Improved Worsened 1. Chronic neck pain (Fourth Area of Pain) (Bilateral) (R>L)   2. Chronic low back pain (Primary Area of Pain) (Bilateral) (R>L)   3. Chronic occipital neuralgia (Fifth Area of Pain) (Bilateral) (R>L)   4. Spondylosis without myelopathy or radiculopathy, cervical region   5. Cervical facet syndrome   6. DDD (degenerative disc disease), lumbosacral   7. DDD (degenerative disc disease), cervical   8. Chronic pain syndrome   9. Chronic lumbar radicular pain     Problems updated and reviewed during this visit: Problem   Spondylosis Without Myelopathy Or Radiculopathy, Cervical Region   Plan of Care  Pharmacotherapy (Medications Ordered): No orders of the defined types were placed in this encounter.  Medications administered today: Johnanna L. Capito had no medications administered during this visit.   Procedure Orders     Lumbar Epidural Injection     Lumbar Transforaminal Epidural     CERVICAL FACET (MEDIAL BRANCH NERVE BLOCK)  Lab Orders  No laboratory test(s) ordered today   Imaging Orders  No imaging studies ordered today    Referral Orders     Ambulatory referral to Psychology Interventional management options: Planned, scheduled, and/or pending:   Diagnostic bilateral cervical facet block #1 under fluoroscopic guidance and IV sedation  The patient will be sent to have a medical psychology evaluation for implants.  Should the patient proved to be a good candidate, we will go ahead and do a spinal cord stimulator trial in the thoracolumbar region.    Considering:   Possible  bilateral lumbar spinal cord stimulator trial  Diagnostic bilateral lumbar facet block Possible bilateral lumbar facet RFA Diagnostic bilateral sacroiliac joint block Possible bilateral sacroiliac joint RFA Diagnostic bilateral intra-articular hip joint injection Diagnostic bilateral femoral nerve block + obturator nerve block Possible bilateral femoral nerve + obturator nerve RFA Diagnostic right-sided L1-2lumbar epidural steroid injection Diagnostic right-sided L1-2transforaminal epidural steroid injection Diagnostic bilateralL4-5lumbar transforaminal epidural steroid injection Diagnostic bilateral cervical facet block Possible bilateral cervical facet RFA Diagnostic right-sided cervical epidural steroid injection Diagnostic bilateral greater occipital nerve block Possible bilateral occipital nerve RFA Diagnostic bilateral intra-articular shoulder joint injection Diagnostic bilateral  suprascapular nerve block Possible bilateral suprascapular nerve RFA   Palliative PRN treatment(s):   Diagnostic right-sided L1-2 interlaminar LESI +  right-sided L1 TFESI    Provider-requested follow-up: No follow-ups on file.  Future Appointments  Date Time Provider [place]  03/10/2018  2:00 PM Milinda Pointer, MD [facility] None  03/10/2018  2:30 PM Pleas Koch, NP [facility]  04/07/2018 11:30 AM Vevelyn Francois, NP [facility] None   Primary Care Physician: Pleas Koch, NP Location: [facility] Note by: Gaspar Cola, MD Date: 03/09/2018; Time: 2:45 PM

## 2018-03-10 ENCOUNTER — Ambulatory Visit: Payer: PPO | Admitting: Primary Care

## 2018-03-10 ENCOUNTER — Other Ambulatory Visit: Payer: Self-pay

## 2018-03-10 ENCOUNTER — Ambulatory Visit
Admission: RE | Admit: 2018-03-10 | Discharge: 2018-03-10 | Disposition: A | Discharge status: Home / Self Care | Payer: PPO | Source: Ambulatory Visit | Attending: Pain Medicine | Admitting: Pain Medicine

## 2018-03-10 ENCOUNTER — Ambulatory Visit (HOSPITAL_BASED_OUTPATIENT_CLINIC_OR_DEPARTMENT_OTHER): Payer: PPO | Admitting: Pain Medicine

## 2018-03-10 VITALS — BP 136/64 | HR 64 | Temp 98.5°F | Resp 16 | Ht 61.0 in | Wt 140.0 lb

## 2018-03-10 DIAGNOSIS — M47812 Spondylosis without myelopathy or radiculopathy, cervical region: Secondary | ICD-10-CM | POA: Diagnosis not present

## 2018-03-10 DIAGNOSIS — M5030 Other cervical disc degeneration, unspecified cervical region: Secondary | ICD-10-CM | POA: Insufficient documentation

## 2018-03-10 DIAGNOSIS — M542 Cervicalgia: Secondary | ICD-10-CM | POA: Insufficient documentation

## 2018-03-10 MED ORDER — LACTATED RINGERS IV SOLN
1000.0000 mL | Freq: Once | INTRAVENOUS | Status: AC
  Administered 2018-03-10: 1000 mL via INTRAVENOUS

## 2018-03-10 MED ORDER — ROPIVACAINE HCL 2 MG/ML IJ SOLN
18.0000 mL | Freq: Once | INTRAMUSCULAR | Status: AC
  Administered 2018-03-10: 10 mL via PERINEURAL
  Filled 2018-03-10: qty 20

## 2018-03-10 MED ORDER — DEXAMETHASONE SODIUM PHOSPHATE 10 MG/ML IJ SOLN
20.0000 mg | Freq: Once | INTRAMUSCULAR | Status: AC
  Administered 2018-03-10: 10 mg
  Filled 2018-03-10: qty 2

## 2018-03-10 MED ORDER — FENTANYL CITRATE (PF) 100 MCG/2ML IJ SOLN
25.0000 ug | INTRAMUSCULAR | Status: DC | PRN
  Administered 2018-03-10: 100 ug via INTRAVENOUS
  Filled 2018-03-10: qty 2

## 2018-03-10 MED ORDER — LIDOCAINE HCL 2 % IJ SOLN
20.0000 mL | Freq: Once | INTRAMUSCULAR | Status: AC
  Administered 2018-03-10: 400 mg
  Filled 2018-03-10: qty 20

## 2018-03-10 MED ORDER — MIDAZOLAM HCL 5 MG/5ML IJ SOLN
1.0000 mg | INTRAMUSCULAR | Status: DC | PRN
  Administered 2018-03-10: 2 mg via INTRAVENOUS
  Filled 2018-03-10: qty 5

## 2018-03-10 NOTE — Patient Instructions (Signed)

## 2018-03-10 NOTE — Progress Notes (Signed)
Patient's Name: Karen Edwards  MRN: [id number]  Referring Provider: Pleas Koch, NP  DOB: 19-Dec-1965  PCP: Pleas Koch, NP  DOS: 03/10/2018  Note by: Gaspar Cola, MD  Service setting: Ambulatory outpatient  Specialty: Interventional Pain Management  Patient type: Established  Location: [facility] (AMB) Pain Management Facility  Visit type: Interventional Procedure   Primary Reason for Visit: Interventional Pain Management Treatment. CC: Neck Pain (base)  Procedure:       Anesthesia, Analgesia, Anxiolysis:  Type: Cervical Facet Medial Branch Block(s) #1  Primary Purpose: Diagnostic Region: Posterolateral cervical spine Level: C3, C4, C5, C6, & C7 Medial Branch Level(s). Injecting these levels blocks the C3-4, C4-5, C5-6, and C6-7 cervical facet joints. Laterality: Bilateral Paraspinal  Type: Moderate (Conscious) Sedation combined with Local Anesthesia Indication(s): Analgesia and Anxiety Route: Intravenous (IV) IV Access: Secured Sedation: Meaningful verbal contact was maintained at all times during the procedure  Local Anesthetic: Lidocaine 1-2%   Indications: 1. Spondylosis without myelopathy or radiculopathy, cervical region   2. Cervical facet syndrome   3. Cervicalgia   4. DDD (degenerative disc disease), cervical    Pain Score: Pre-procedure: 3 /10 Post-procedure: 0-No pain/10  Pre-op Assessment:  Karen Edwards is a 52 y.o. (year old), female patient, seen today for interventional treatment. She  has a past surgical history that includes Cesarean section; Ankle surgery (Right, 03/02/2014); Hemorrhoid surgery (N/A, 06/16/2015); Cervical spine surgery (02/21/2016); Cesarean section with bilateral tubal ligation (1990); Colonoscopy (04/19/2015); and Hysteroscopy w/D&C (12/24/2013). Karen Edwards has a current medication list which includes the following prescription(s): atorvastatin, cyanocobalamin, fluoxetine, green tea (camellia sinensis), hydrocodone-acetaminophen,  hydrocodone-acetaminophen, methocarbamol, oxybutynin, potassium, trazodone, and linaclotide, and the following Facility-Administered Medications: fentanyl and midazolam. Her primarily concern today is the Neck Pain (base)  Initial Vital Signs:  Pulse/HCG Rate: 64ECG Heart Rate: (!) 52 Temp: 98.5 F (36.9 C) Resp: 16 BP: 116/77 SpO2: 99 %  BMI: Estimated body mass index is 26.45 kg/m as calculated from the following:   Height as of this encounter: 5\' 1"  (1.549 m).   Weight as of this encounter: 140 lb (63.5 kg).  Risk Assessment: Allergies: Reviewed. She is allergic to no known allergies.  Allergy Precautions: None required Coagulopathies: Reviewed. None identified.  Blood-thinner therapy: None at this time Active Infection(s): Reviewed. None identified. Karen Edwards is afebrile  Site Confirmation: Karen Edwards was asked to confirm the procedure and laterality before marking the site Procedure checklist: Completed Consent: Before the procedure and under the influence of no sedative(s), amnesic(s), or anxiolytics, the patient was informed of the treatment options, risks and possible complications. To fulfill our ethical and legal obligations, as recommended by the American Medical Association's Code of Ethics, I have informed the patient of my clinical impression; the nature and purpose of the treatment or procedure; the risks, benefits, and possible complications of the intervention; the alternatives, including doing nothing; the risk(s) and benefit(s) of the alternative treatment(s) or procedure(s); and the risk(s) and benefit(s) of doing nothing. The patient was provided information about the general risks and possible complications associated with the procedure. These may include, but are not limited to: failure to achieve desired goals, infection, bleeding, organ or nerve damage, allergic reactions, paralysis, and death. In addition, the patient was informed of those risks and  complications associated to Spine-related procedures, such as failure to decrease pain; infection (i.e.: Meningitis, epidural or intraspinal abscess); bleeding (i.e.: epidural hematoma, subarachnoid hemorrhage, or any other type of intraspinal or peri-dural bleeding); organ or nerve  damage (i.e.: Any type of peripheral nerve, nerve root, or spinal cord injury) with subsequent damage to sensory, motor, and/or autonomic systems, resulting in permanent pain, numbness, and/or weakness of one or several areas of the body; allergic reactions; (i.e.: anaphylactic reaction); and/or death. Furthermore, the patient was informed of those risks and complications associated with the medications. These include, but are not limited to: allergic reactions (i.e.: anaphylactic or anaphylactoid reaction(s)); adrenal axis suppression; blood sugar elevation that in diabetics may result in ketoacidosis or comma; water retention that in patients with history of congestive heart failure may result in shortness of breath, pulmonary edema, and decompensation with resultant heart failure; weight gain; swelling or edema; medication-induced neural toxicity; particulate matter embolism and blood vessel occlusion with resultant organ, and/or nervous system infarction; and/or aseptic necrosis of one or more joints. Finally, the patient was informed that Medicine is not an exact science; therefore, there is also the possibility of unforeseen or unpredictable risks and/or possible complications that may result in a catastrophic outcome. The patient indicated having understood very clearly. We have given the patient no guarantees and we have made no promises. Enough time was given to the patient to ask questions, all of which were answered to the patient's satisfaction. Karen Edwards has indicated that she wanted to continue with the procedure. Attestation: I, the ordering provider, attest that I have discussed with the patient the benefits, risks,  side-effects, alternatives, likelihood of achieving goals, and potential problems during recovery for the procedure that I have provided informed consent. Date  Time: 03/10/2018  2:26 PM  Pre-Procedure Preparation:  Monitoring: As per clinic protocol. Respiration, ETCO2, SpO2, BP, heart rate and rhythm monitor placed and checked for adequate function Safety Precautions: Patient was assessed for positional comfort and pressure points before starting the procedure. Time-out: I initiated and conducted the "Time-out" before starting the procedure, as per protocol. The patient was asked to participate by confirming the accuracy of the "Time Out" information. Verification of the correct person, site, and procedure were performed and confirmed by me, the nursing staff, and the patient. "Time-out" conducted as per Joint Commission's Universal Protocol (UP.01.01.01). Time: 1546  Description of Procedure:       Position: Prone with head of the table raised to facilitate breathing. Laterality: Bilateral. The procedure was performed in identical fashion on both sides. Level: C3, C4, C5, C6, & C7 Medial Branch Level(s). Area Prepped: Posterior Cervico-thoracic Region Prepping solution: ChloraPrep (2% chlorhexidine gluconate and 70% isopropyl alcohol) Safety Precautions: Aspiration looking for blood return was conducted prior to all injections. At no point did we inject any substances, as a needle was being advanced. Before injecting, the patient was told to immediately notify me if she was experiencing any new onset of "ringing in the ears, or metallic taste in the mouth". No attempts were made at seeking any paresthesias. Safe injection practices and needle disposal techniques used. Medications properly checked for expiration dates. SDV (single dose vial) medications used. After the completion of the procedure, all disposable equipment used was discarded in the proper designated medical waste containers. Local  Anesthesia: Protocol guidelines were followed. The patient was positioned over the fluoroscopy table. The area was prepped in the usual manner. The time-out was completed. The target area was identified using fluoroscopy. A 12-in long, straight, sterile hemostat was used with fluoroscopic guidance to locate the targets for each level blocked. Once located, the skin was marked with an approved surgical skin marker. Once all sites were marked, the  skin (epidermis, dermis, and hypodermis), as well as deeper tissues (fat, connective tissue and muscle) were infiltrated with a small amount of a short-acting local anesthetic, loaded on a 10cc syringe with a 25G, 1.5-in  Needle. An appropriate amount of time was allowed for local anesthetics to take effect before proceeding to the next step. Local Anesthetic: Lidocaine 2.0% The unused portion of the local anesthetic was discarded in the proper designated containers. Technical explanation of process:  C3 Medial Branch Nerve Block (MBB): The target area for the C3 dorsal medial articular branch is the lateral concave waist of the articular pillar of C3. Under fluoroscopic guidance, a Quincke needle was inserted until contact was made with os over the postero-lateral aspect of the articular pillar of C3 (target area). After negative aspiration for blood, 0.5 mL of the nerve block solution was injected without difficulty or complication. The needle was removed intact. C4 Medial Branch Nerve Block (MBB): The target area for the C4 dorsal medial articular branch is the lateral concave waist of the articular pillar of C4. Under fluoroscopic guidance, a Quincke needle was inserted until contact was made with os over the postero-lateral aspect of the articular pillar of C4 (target area). After negative aspiration for blood, 0.5 mL of the nerve block solution was injected without difficulty or complication. The needle was removed intact. C5 Medial Branch Nerve Block (MBB): The  target area for the C5 dorsal medial articular branch is the lateral concave waist of the articular pillar of C5. Under fluoroscopic guidance, a Quincke needle was inserted until contact was made with os over the postero-lateral aspect of the articular pillar of C5 (target area). After negative aspiration for blood, 0.5 mL of the nerve block solution was injected without difficulty or complication. The needle was removed intact. C6 Medial Branch Nerve Block (MBB): The target area for the C6 dorsal medial articular branch is the lateral concave waist of the articular pillar of C6. Under fluoroscopic guidance, a Quincke needle was inserted until contact was made with os over the postero-lateral aspect of the articular pillar of C6 (target area). After negative aspiration for blood, 0.5 mL of the nerve block solution was injected without difficulty or complication. The needle was removed intact. C7 Medial Branch Nerve Block (MBB): The target for the C7 dorsal medial articular branch lies on the superior-medial tip of the C7 transverse process. Under fluoroscopic guidance, a Quincke needle was inserted until contact was made with os over the postero-lateral aspect of the articular pillar of C7 (target area). After negative aspiration for blood, 0.5 mL of the nerve block solution was injected without difficulty or complication. The needle was removed intact. Procedural Needles: 22-gauge, 3.5-inch, Quincke needles used for all levels. Nerve block solution: 0.2% PF-Ropivacaine + Triamcinolone (40 mg/mL) diluted to a final concentration of 4 mg of Triamcinolone/mL of Ropivacaine The unused portion of the solution was discarded in the proper designated containers.  Once the entire procedure was completed, the treated area was cleaned, making sure to leave some of the prepping solution back to take advantage of its long term bactericidal properties.  Vitals:   03/10/18 1600 03/10/18 1610 03/10/18 1620 03/10/18 1627    BP: 135/72 (!) 141/60 131/63 136/64  Pulse:      Resp: 12 14 16 16   Temp:      SpO2: 94% 94% 96% 97%  Weight:      Height:        Start Time: 1546 hrs. End Time: 1600  hrs.  Imaging Guidance (Spinal):  Type of Imaging Technique: Fluoroscopy Guidance (Spinal) Indication(s): Assistance in needle guidance and placement for procedures requiring needle placement in or near specific anatomical locations not easily accessible without such assistance. Exposure Time: Please see nurses notes. Contrast: None used. Fluoroscopic Guidance: I was personally present during the use of fluoroscopy. "Tunnel Vision Technique" used to obtain the best possible view of the target area. Parallax error corrected before commencing the procedure. "Direction-depth-direction" technique used to introduce the needle under continuous pulsed fluoroscopy. Once target was reached, antero-posterior, oblique, and lateral fluoroscopic projection used confirm needle placement in all planes. Images permanently stored in EMR. Interpretation: No contrast injected. I personally interpreted the imaging intraoperatively. Adequate needle placement confirmed in multiple planes. Permanent images saved into the patient's record.  Antibiotic Prophylaxis:   Anti-infectives (From admission, onward)   None     Indication(s): None identified  Post-operative Assessment:  Post-procedure Vital Signs:  Pulse/HCG Rate: 64(!) 56 Temp: 98.5 F (36.9 C) Resp: 16 BP: 136/64 SpO2: 97 %  EBL: None  Complications: No immediate post-treatment complications observed by team, or reported by patient.  Note: The patient tolerated the entire procedure well. A repeat set of vitals were taken after the procedure and the patient was kept under observation following institutional policy, for this type of procedure. Post-procedural neurological assessment was performed, showing return to baseline, prior to discharge. The patient was provided with  post-procedure discharge instructions, including a section on how to identify potential problems. Should any problems arise concerning this procedure, the patient was given instructions to immediately contact us, at any time, without hesitation. In any case, we plan to contact the patient by telephone for a follow-up status report regarding this interventional procedure.  Comments:  No additional relevant information.  Plan of Care    Imaging Orders     DG C-Arm 1-60 Min-No Report  Procedure Orders     CERVICAL FACET (MEDIAL BRANCH NERVE BLOCK)   Medications ordered for procedure: Meds ordered this encounter  Medications  . lidocaine (XYLOCAINE) 2 % (with pres) injection 400 mg  . midazolam (VERSED) 5 MG/5ML injection 1-2 mg    Make sure Flumazenil is available in the pyxis when using this medication. If oversedation occurs, administer 0.2 mg IV over 15 sec. If after 45 sec no response, administer 0.2 mg again over 1 min; may repeat at 1 min intervals; not to exceed 4 doses (1 mg)  . fentaNYL (SUBLIMAZE) injection 25-50 mcg    Make sure Narcan is available in the pyxis when using this medication. In the event of respiratory depression (RR< 8/min): Titrate NARCAN (naloxone) in increments of 0.1 to 0.2 mg IV at 2-3 minute intervals, until desired degree of reversal.  . lactated ringers infusion 1,000 mL  . ropivacaine (PF) 2 mg/mL (0.2%) (NAROPIN) injection 18 mL  . dexamethasone (DECADRON) injection 20 mg   Medications administered: We administered lidocaine, midazolam, fentaNYL, lactated ringers, ropivacaine (PF) 2 mg/mL (0.2%), and dexamethasone.  See the medical record for exact dosing, route, and time of administration.  New Prescriptions   No medications on file   Disposition: Discharge home  Discharge Date & Time: 03/10/2018; 1628 hrs.   Physician-requested Follow-up: Return for post-procedure eval (2 wks), w/ Dr. Dossie Arbour.  Future Appointments  Date Time Provider [place]  03/16/2018 11:15 AM Pleas Koch, NP [facility]  04/06/2018  1:30 PM Milinda Pointer, MD [facility] None  04/07/2018 11:30 AM Vevelyn Francois, NP [facility]  None   Primary Care Physician: Pleas Koch, NP Location: [facility] Note by: Gaspar Cola, MD Date: 03/10/2018; Time: 5:21 PM  Disclaimer:  Medicine is not an Chief Strategy Officer. The only guarantee in medicine is that nothing is guaranteed. It is important to note that the decision to proceed with this intervention was based on the information collected from the patient. The Data and conclusions were drawn from the patient's questionnaire, the interview, and the physical examination. Because the information was provided in large part by the patient, it cannot be guaranteed that it has not been purposely or unconsciously manipulated. Every effort has been made to obtain as much relevant data as possible for this evaluation. It is important to note that the conclusions that lead to this procedure are derived in large part from the available data. Always take into account that the treatment will also be dependent on availability of resources and existing treatment guidelines, considered by other Pain Management Practitioners as being common knowledge and practice, at the time of the intervention. For Medico-Legal purposes, it is also important to point out that variation in procedural techniques and pharmacological choices are the acceptable norm. The indications, contraindications, technique, and results of the above procedure should only be interpreted and judged by a Board-Certified Interventional Pain Specialist with extensive familiarity and expertise in the same exact procedure and technique.

## 2018-03-10 NOTE — Progress Notes (Signed)
Safety precautions to be maintained throughout the outpatient stay will include: orient to surroundings, keep bed in low position, maintain call bell within reach at all times, provide assistance with transfer out of bed and ambulation.  

## 2018-03-11 ENCOUNTER — Telehealth: Payer: Self-pay

## 2018-03-11 ENCOUNTER — Telehealth: Payer: Self-pay | Admitting: *Deleted

## 2018-03-11 NOTE — Telephone Encounter (Signed)
Post procedure phone call.  Patient states she is doing well.  

## 2018-03-12 ENCOUNTER — Telehealth: Payer: Self-pay | Admitting: *Deleted

## 2018-03-16 ENCOUNTER — Ambulatory Visit (INDEPENDENT_AMBULATORY_CARE_PROVIDER_SITE_OTHER): Payer: PPO | Admitting: Primary Care

## 2018-03-16 ENCOUNTER — Encounter (INDEPENDENT_AMBULATORY_CARE_PROVIDER_SITE_OTHER): Payer: Self-pay

## 2018-03-16 ENCOUNTER — Encounter: Payer: Self-pay | Admitting: Primary Care

## 2018-03-16 VITALS — BP 126/70 | HR 78 | Temp 98.8°F | Resp 16 | Ht 61.0 in | Wt 143.2 lb

## 2018-03-16 DIAGNOSIS — N393 Stress incontinence (female) (male): Secondary | ICD-10-CM

## 2018-03-16 DIAGNOSIS — K649 Unspecified hemorrhoids: Secondary | ICD-10-CM

## 2018-03-16 DIAGNOSIS — K2100 Gastro-esophageal reflux disease with esophagitis, without bleeding: Secondary | ICD-10-CM

## 2018-03-16 DIAGNOSIS — R140 Abdominal distension (gaseous): Secondary | ICD-10-CM | POA: Diagnosis not present

## 2018-03-16 DIAGNOSIS — R112 Nausea with vomiting, unspecified: Secondary | ICD-10-CM | POA: Diagnosis not present

## 2018-03-16 DIAGNOSIS — K5909 Other constipation: Secondary | ICD-10-CM

## 2018-03-16 MED ORDER — HYDROCORTISONE 2.5 % RE CREA: 1.0000 "application " | TOPICAL_CREAM | Freq: Two times a day (BID) | RECTAL | 0 refills | Status: DC

## 2018-03-16 MED ORDER — OMEPRAZOLE 20 MG PO CPDR: DELAYED_RELEASE_CAPSULE | ORAL | 0 refills | Status: DC

## 2018-03-16 NOTE — Assessment & Plan Note (Addendum)
Infrequent classic GERD symptoms, however, suspect some of her nausea and vomiting could be secondary.   Rx for omeprazole 20 mg sent to pharmacy. Referral placed to GI for further evaluation given recurrent vomiting after meals. Will likely need endoscopy and/or gastric emptying studies? Consider IBS?

## 2018-03-16 NOTE — Assessment & Plan Note (Signed)
Could not afford Linzess or Amitiza. Likely narcotic induced constipation.   Will have her trial Miralax twice daily, reduce to once daily or less frequently for diarrhea. Discussed to increase dietary fiber and water. Get regular exercise.  Referral placed to GI.

## 2018-03-16 NOTE — Progress Notes (Signed)
Subjective:    Patient ID: Karen Edwards, female    DOB: Mar 04, 1966, 52 y.o.   MRN: [id number]  HPI  Karen Edwards is a 52 year old female with a history of urinary incontinence, GERD, constipation, chronic back pain, chronic pain syndrome who presents today with multiple complaints:  1) Urinary Incontinence: Last evaluated for urinary incontinence in March 2019. Reports of stress incontinence with sneezing, coughing, pending forward that had been present since 2018, worse last visit. During her last visit we initiated Oxybutynin XL 5 mg.   Since her last visit she's not noticed any improvement since initiation of Oxybutynin. She thinks her symptoms are worse as she's noticing increased urinary leakage. She denies hematuria, pelvic pressure.   2) Constipation: Chronic, managed on Norco for chronic pain. She's tried Miralax for a few days, Exlax, stool softeners, Metamucil without improvement . She was once managed on Linzess in the past which was effective but now too expensive. She could not afford Amitiza which was prescribed in the past. She's currently experiencing bowel movements once weekly which are firm and uncomfortable. History of hemorrhoidectomy 3-4 years ago. She thinks she's noticed hemorrhoids on the external rectum from recent constipation. She denies recent rectal bleeding.   3) Abdominal Bloating: Occurs after eating and drinking daily. This has been present for about one year total. She eats once meal daily, smaller portions and will experiences bloating with fullness. She mostly eats one meal daily around 6pm-8pm.   She's also started vomiting within 30 minutes to one hour after eating a meal, regardless of the type of food. There is no specific food that causes these symptoms. She's thrown up daily for the last 6 months, experiences nausea first. She does notice occasional esophageal reflux, not currently on medication.   Review of Systems  Constitutional: Negative for  fever and unexpected weight change.  Respiratory: Negative for shortness of breath.   Cardiovascular: Negative for chest pain.  Gastrointestinal: Positive for abdominal distention, constipation, nausea, rectal pain and vomiting. Negative for abdominal pain, blood in stool and diarrhea.  Genitourinary: Negative for dysuria, frequency, hematuria, pelvic pain and vaginal discharge.       Urinary incontinence       Past Medical History:  Diagnosis Date  . Anxiety   . Chronic back pain   . Degenerative joint disease   . Depression   . Hypotension   . IBS (irritable bowel syndrome) 05/2015  . MVP (mitral valve prolapse)      Social History   Socioeconomic History  . Marital status: Married    Spouse name: Not on file  . Number of children: 2  . Years of education: Not on file  . Highest education level: Not on file  Occupational History  . Occupation: disabled  Social Needs  . Financial resource strain: Not hard at all  . Food insecurity:    Worry: Patient refused    Inability: Patient refused  . Transportation needs:    Medical: Patient refused    Non-medical: Patient refused  Tobacco Use  . Smoking status: Current Every Day Smoker    Packs/day: 0.10    Years: 37.00    Pack years: 3.70    Types: Cigarettes  . Smokeless tobacco: Never Used  Substance and Sexual Activity  . Alcohol use: Yes    Alcohol/week: 0.6 oz    Types: 1 Cans of beer per week    Comment: drinks an occasional beer  . Drug use: No  .  Sexual activity: Yes    Birth control/protection: None, Surgical    Comment: tubal ligation  Lifestyle  . Physical activity:    Days per week: Patient refused    Minutes per session: Patient refused  . Stress: Not at all  Relationships  . Social connections:    Talks on phone: Patient refused    Gets together: Patient refused    Attends religious service: Patient refused    Active member of club or organization: Patient refused    Attends meetings of clubs or  organizations: Patient refused    Relationship status: Patient refused  . Intimate partner violence:    Fear of current or ex partner: Patient refused    Emotionally abused: Patient refused    Physically abused: Patient refused    Forced sexual activity: Patient refused  Other Topics Concern  . Not on file  Social History Narrative   Married.   2 children. One boy and one girl.   On disability.   Playing with her dogs, gardening.    Past Surgical History:  Procedure Laterality Date  . ANKLE SURGERY Right 03/02/2014   repair of tendon and sural nerve right ankle  . CERVICAL SPINE SURGERY  02/21/2016   arthrodesis of anterior cervical spine (fusion of C5-6,C6-7 and insertion of bone allograft  . CESAREAN SECTION    . CESAREAN SECTION WITH BILATERAL TUBAL LIGATION  1990  . COLONOSCOPY  04/19/2015  . HEMORRHOID SURGERY N/A 06/16/2015   Procedure: HEMORRHOIDECTOMY;  Surgeon: Leonie Green, MD;  Location: [facility] ORS;  Service: General;  Laterality: N/A;  . HYSTEROSCOPY W/D&C  12/24/2013    Family History  Problem Relation Age of Onset  . Arthritis Mother   . Depression Mother   . Hyperlipidemia Mother   . Hypertension Mother   . Kidney disease Mother   . Vision loss Mother   . Hypothyroidism Mother   . Hearing loss Father   . Hyperlipidemia Father   . Hypertension Father   . Diabetes Mellitus I Maternal Grandmother   . Stroke Maternal Grandmother   . Congestive Heart Failure Maternal Grandfather   . Heart attack Maternal Grandfather   . Prostate cancer Paternal Grandfather   . Thyroid disease Maternal Aunt   . Diabetes Maternal Aunt   . Gout Maternal Aunt     Allergies  Allergen Reactions  . No Known Allergies     Current Outpatient Medications on File Prior to Visit  Medication Sig Dispense Refill  . atorvastatin (LIPITOR) 10 MG tablet Take 1 tablet (10 mg total) by mouth daily. 90 tablet 3  . Cyanocobalamin (B-12 PO) Take 2 tablets by mouth every other day.      Marland Kitchen FLUoxetine (PROZAC) 20 MG capsule Take 3 capsules (60 mg total) by mouth daily. 270 capsule 3  . Green Tea, Camillia sinensis, (GREEN TEA PO) Take 2 capsules by mouth daily.     Marland Kitchen HYDROcodone-acetaminophen (NORCO/VICODIN) 5-325 MG tablet Take 1 tablet by mouth 2 (two) times daily. 60 tablet 0  . methocarbamol (ROBAXIN) 500 MG tablet Take 1 tablet (500 mg total) by mouth every 8 (eight) hours as needed for muscle spasms. 90 tablet 1  . Potassium 99 MG TABS Take 1 tablet by mouth daily.    . traZODone (DESYREL) 100 MG tablet Take 3 tablets (300 mg total) by mouth at bedtime. 270 tablet 3  . oxybutynin (DITROPAN-XL) 5 MG 24 hr tablet Take 1 tablet (5 mg total) by mouth at bedtime. (Patient not  taking: Reported on 03/16/2018) 90 tablet 0   No current facility-administered medications on file prior to visit.     BP 126/70   Pulse 78   Temp 98.8 F (37.1 C) (Oral)   Resp 16   Ht 5\' 1"  (1.549 m)   Wt 143 lb 4 oz (65 kg)   LMP 05/05/2017 (Exact Date)   SpO2 98%   BMI 27.07 kg/m    Objective:   Physical Exam  Constitutional: She appears well-nourished.  Cardiovascular: Normal rate.  Pulmonary/Chest: Effort normal.  Abdominal: Soft. Bowel sounds are normal.  Genitourinary: Rectal exam shows external hemorrhoid.  Genitourinary Comments: Three non thrombosed external hemorrhoids to rectum. Non tender.  Skin: Skin is warm and dry.          Assessment & Plan:

## 2018-03-16 NOTE — Assessment & Plan Note (Signed)
Three located to external rectum. Rx for anusol HC cream sent to pharmacy. Suspect this to be secondary to chronic constipation from chronic narcotic use.

## 2018-03-16 NOTE — Patient Instructions (Signed)
Start Miralax and take this daily to twice daily. Reduce frequency if you notice diarrhea.  Increase fiber intake, take a look below.  Ensure you are consuming 64 ounces of water daily.  You should be getting 150 minutes of moderate intensity exercise weekly.  You will be contacted regarding your referral to GI and Urology.  Please let us know if you have not been contacted within one week.   You can apply the anusol cream twice daily for the hemorrhoids.   It was a pleasure to see you today!   High-Fiber Diet Fiber, also called dietary fiber, is a type of carbohydrate found in fruits, vegetables, whole grains, and beans. A high-fiber diet can have many health benefits. Your health care provider may recommend a high-fiber diet to help:  Prevent constipation. Fiber can make your bowel movements more regular.  Lower your cholesterol.  Relieve hemorrhoids, uncomplicated diverticulosis, or irritable bowel syndrome.  Prevent overeating as part of a weight-loss plan.  Prevent heart disease, type 2 diabetes, and certain cancers.  What is my plan? The recommended daily intake of fiber includes:  38 grams for men under age 26.  64 grams for men over age 16.  58 grams for women under age 50.  52 grams for women over age 30.  You can get the recommended daily intake of dietary fiber by eating a variety of fruits, vegetables, grains, and beans. Your health care provider may also recommend a fiber supplement if it is not possible to get enough fiber through your diet. What do I need to know about a high-fiber diet?  Fiber supplements have not been widely studied for their effectiveness, so it is better to get fiber through food sources.  Always check the fiber content on thenutrition facts label of any prepackaged food. Look for foods that contain at least 5 grams of fiber per serving.  Ask your dietitian if you have questions about specific foods that are related to your condition,  especially if those foods are not listed in the following section.  Increase your daily fiber consumption gradually. Increasing your intake of dietary fiber too quickly may cause bloating, cramping, or gas.  Drink plenty of water. Water helps you to digest fiber. What foods can I eat? Grains Whole-grain breads. Multigrain cereal. Oats and oatmeal. Brown rice. Barley. Bulgur wheat. [place]. Bran muffins. Popcorn. Rye wafer crackers. Vegetables Sweet potatoes. Spinach. Kale. Artichokes. Cabbage. Broccoli. Green peas. Carrots. Squash. Fruits Berries. Pears. Apples. Oranges. Avocados. Prunes and raisins. Dried figs. Meats and Other Protein Sources Navy, kidney, pinto, and soy beans. Split peas. Lentils. Nuts and seeds. Dairy Fiber-fortified yogurt. Beverages Fiber-fortified soy milk. Fiber-fortified orange juice. Other Fiber bars. The items listed above may not be a complete list of recommended foods or beverages. Contact your dietitian for more options. What foods are not recommended? Grains White bread. Pasta made with refined flour. White rice. Vegetables Fried potatoes. Canned vegetables. Well-cooked vegetables. Fruits Fruit juice. Cooked, strained fruit. Meats and Other Protein Sources Fatty cuts of meat. Fried Sales executive or fried fish. Dairy Milk. Yogurt. Cream cheese. Sour cream. Beverages Soft drinks. Other Cakes and pastries. Butter and oils. The items listed above may not be a complete list of foods and beverages to avoid. Contact your dietitian for more information. What are some tips for including high-fiber foods in my diet?  Eat a wide variety of high-fiber foods.  Make sure that half of all grains consumed each day are whole grains.  Replace breads  and cereals made from refined flour or white flour with whole-grain breads and cereals.  Replace white rice with brown rice, bulgur wheat, or millet.  Start the day with a breakfast that is high in fiber, such as a  cereal that contains at least 5 grams of fiber per serving.  Use beans in place of meat in soups, salads, or pasta.  Eat high-fiber snacks, such as berries, raw vegetables, nuts, or popcorn. This information is not intended to replace advice given to you by your health care provider. Make sure you discuss any questions you have with your health care provider. Document Released: 10/14/2005 Document Revised: 03/21/2016 Document Reviewed: 03/29/2014 Elsevier Interactive Patient Education  [employer].

## 2018-03-16 NOTE — Assessment & Plan Note (Signed)
No improvement with oxybutynin XL 5 mg. Will have her increase to 10 mg and see Urology for further evaluation. Referral placed.

## 2018-04-06 ENCOUNTER — Other Ambulatory Visit: Payer: Self-pay | Admitting: Nurse Practitioner

## 2018-04-06 ENCOUNTER — Other Ambulatory Visit: Payer: Self-pay

## 2018-04-06 ENCOUNTER — Encounter: Payer: Self-pay | Admitting: Pain Medicine

## 2018-04-06 ENCOUNTER — Ambulatory Visit: Payer: PPO | Attending: Pain Medicine | Admitting: Pain Medicine

## 2018-04-06 VITALS — BP 101/56 | HR 80 | Temp 98.3°F | Ht 61.0 in | Wt 140.0 lb

## 2018-04-06 DIAGNOSIS — M5481 Occipital neuralgia: Secondary | ICD-10-CM | POA: Insufficient documentation

## 2018-04-06 DIAGNOSIS — Z79899 Other long term (current) drug therapy: Secondary | ICD-10-CM | POA: Diagnosis not present

## 2018-04-06 DIAGNOSIS — M542 Cervicalgia: Secondary | ICD-10-CM

## 2018-04-06 DIAGNOSIS — G8929 Other chronic pain: Secondary | ICD-10-CM | POA: Diagnosis not present

## 2018-04-06 DIAGNOSIS — M47812 Spondylosis without myelopathy or radiculopathy, cervical region: Secondary | ICD-10-CM | POA: Insufficient documentation

## 2018-04-06 DIAGNOSIS — F17210 Nicotine dependence, cigarettes, uncomplicated: Secondary | ICD-10-CM | POA: Diagnosis not present

## 2018-04-06 DIAGNOSIS — Z9889 Other specified postprocedural states: Secondary | ICD-10-CM | POA: Diagnosis not present

## 2018-04-06 DIAGNOSIS — G894 Chronic pain syndrome: Secondary | ICD-10-CM | POA: Diagnosis not present

## 2018-04-06 DIAGNOSIS — M5116 Intervertebral disc disorders with radiculopathy, lumbar region: Secondary | ICD-10-CM | POA: Diagnosis not present

## 2018-04-06 DIAGNOSIS — Z8261 Family history of arthritis: Secondary | ICD-10-CM | POA: Insufficient documentation

## 2018-04-06 DIAGNOSIS — Z9851 Tubal ligation status: Secondary | ICD-10-CM | POA: Diagnosis not present

## 2018-04-06 DIAGNOSIS — Z79891 Long term (current) use of opiate analgesic: Secondary | ICD-10-CM | POA: Diagnosis not present

## 2018-04-06 MED ORDER — HYDROCODONE-ACETAMINOPHEN 5-325 MG PO TABS: 1.0000 | ORAL_TABLET | Freq: Two times a day (BID) | ORAL | 0 refills | Status: DC

## 2018-04-06 NOTE — Patient Instructions (Addendum)
____________________________________________________________________________________________  Preparing for Procedure with Sedation  Instructions: . Oral Intake: Do not eat or drink anything for at least 8 hours prior to your procedure. . Transportation: Public transportation is not allowed. Bring an adult driver. The driver must be physically present in our waiting room before any procedure can be started. Marland Kitchen Physical Assistance: Bring an adult physically capable of assisting you, in the event you need help. This adult should keep you company at home for at least 6 hours after the procedure. . Blood Pressure Medicine: Take your blood pressure medicine with a sip of water the morning of the procedure. . Blood thinners:  . Diabetics on insulin: Notify the staff so that you can be scheduled 1st case in the morning. If your diabetes requires high dose insulin, take only  of your normal insulin dose the morning of the procedure and notify the staff that you have done so. . Preventing infections: Shower with an antibacterial soap the morning of your procedure. . Build-up your immune system: Take 1000 mg of Vitamin C with every meal (3 times a day) the day prior to your procedure. Marland Kitchen Antibiotics: Inform the staff if you have a condition or reason that requires you to take antibiotics before dental procedures. . Pregnancy: If you are pregnant, call and cancel the procedure. . Sickness: If you have a cold, fever, or any active infections, call and cancel the procedure. . Arrival: You must be in the facility at least 30 minutes prior to your scheduled procedure. . Children: Do not bring children with you. . Dress appropriately: Bring dark clothing that you would not mind if they get stained. . Valuables: Do not bring any jewelry or valuables.  Procedure appointments are reserved for interventional treatments only. Marland Kitchen No Prescription Refills. . No medication changes will be discussed during procedure  appointments. . No disability issues will be discussed.  Remember:  Regular Business hours are:  Monday to Thursday 8:00 AM to 4:00 PM  Provider's Schedule: Milinda Pointer, MD:  Procedure days: Tuesday and Thursday 7:30 AM to 4:00 PM  Gillis Santa, MD:  Procedure days: Monday and Wednesday 7:30 AM to 4:00 PM ____________________________________________________________________________________________   GENERAL RISKS AND COMPLICATIONS  What are the risk, side effects and possible complications? Generally speaking, most procedures are safe.  However, with any procedure there are risks, side effects, and the possibility of complications.  The risks and complications are dependent upon the sites that are lesioned, or the type of nerve block to be performed.  The closer the procedure is to the spine, the more serious the risks are.  Great care is taken when placing the radio frequency needles, block needles or lesioning probes, but sometimes complications can occur. 1. Infection: Any time there is an injection through the skin, there is a risk of infection.  This is why sterile conditions are used for these blocks.  There are four possible types of infection. 1. Localized skin infection. 2. Central Nervous System Infection-This can be in the form of Meningitis, which can be deadly. 3. Epidural Infections-This can be in the form of an epidural abscess, which can cause pressure inside of the spine, causing compression of the spinal cord with subsequent paralysis. This would require an emergency surgery to decompress, and there are no guarantees that the patient would recover from the paralysis. 4. Discitis-This is an infection of the intervertebral discs.  It occurs in about 1% of discography procedures.  It is difficult to treat and it may  lead to surgery.        2. Pain: the needles have to go through skin and soft tissues, will cause soreness.       3. Damage to internal structures:  The  nerves to be lesioned may be near blood vessels or    other nerves which can be potentially damaged.       4. Bleeding: Bleeding is more common if the patient is taking blood thinners such as  aspirin, Coumadin, Ticiid, Plavix, etc., or if he/she have some genetic predisposition  such as hemophilia. Bleeding into the spinal canal can cause compression of the spinal  cord with subsequent paralysis.  This would require an emergency surgery to  decompress and there are no guarantees that the patient would recover from the  paralysis.       5. Pneumothorax:  Puncturing of a lung is a possibility, every time a needle is introduced in  the area of the chest or upper back.  Pneumothorax refers to free air around the  collapsed lung(s), inside of the thoracic cavity (chest cavity).  Another two possible  complications related to a similar event would include: Hemothorax and Chylothorax.   These are variations of the Pneumothorax, where instead of air around the collapsed  lung(s), you may have blood or chyle, respectively.       6. Spinal headaches: They may occur with any procedures in the area of the spine.       7. Persistent CSF (Cerebro-Spinal Fluid) leakage: This is a rare problem, but may occur  with prolonged intrathecal or epidural catheters either due to the formation of a fistulous  track or a dural tear.       8. Nerve damage: By working so close to the spinal cord, there is always a possibility of  nerve damage, which could be as serious as a permanent spinal cord injury with  paralysis.       9. Death:  Although rare, severe deadly allergic reactions known as "Anaphylactic  reaction" can occur to any of the medications used.      10. Worsening of the symptoms:  We can always make thing worse.  What are the chances of something like this happening? Chances of any of this occuring are extremely low.  By statistics, you have more of a chance of getting killed in a motor vehicle accident: while driving  to the hospital than any of the above occurring .  Nevertheless, you should be aware that they are possibilities.  In general, it is similar to taking a shower.  Everybody knows that you can slip, hit your head and get killed.  Does that mean that you should not shower again?  Nevertheless always keep in mind that statistics do not mean anything if you happen to be on the wrong side of them.  Even if a procedure has a 1 (one) in a 1,000,000 (million) chance of going wrong, it you happen to be that one..Also, keep in mind that by statistics, you have more of a chance of having something go wrong when taking medications.  Who should not have this procedure? If you are on a blood thinning medication (e.g. Coumadin, Plavix, see list of "Blood Thinners"), or if you have an active infection going on, you should not have the procedure.  If you are taking any blood thinners, please inform your physician.  How should I prepare for this procedure?  Do not eat or drink anything at least  six hours prior to the procedure.  Bring a driver with you .  It cannot be a taxi.  Come accompanied by an adult that can drive you back, and that is strong enough to help you if your legs get weak or numb from the local anesthetic.  Take all of your medicines the morning of the procedure with just enough water to swallow them.  If you have diabetes, make sure that you are scheduled to have your procedure done first thing in the morning, whenever possible.  If you have diabetes, take only half of your insulin dose and notify our nurse that you have done so as soon as you arrive at the clinic.  If you are diabetic, but only take blood sugar pills (oral hypoglycemic), then do not take them on the morning of your procedure.  You may take them after you have had the procedure.  Do not take aspirin or any aspirin-containing medications, at least eleven (11) days prior to the procedure.  They may prolong bleeding.  Wear loose  fitting clothing that may be easy to take off and that you would not mind if it got stained with Betadine or blood.  Do not wear any jewelry or perfume  Remove any nail coloring.  It will interfere with some of our monitoring equipment.  NOTE: Remember that this is not meant to be interpreted as a complete list of all possible complications.  Unforeseen problems may occur.  BLOOD THINNERS The following drugs contain aspirin or other products, which can cause increased bleeding during surgery and should not be taken for 2 weeks prior to and 1 week after surgery.  If you should need take something for relief of minor pain, you may take acetaminophen which is found in Tylenol,m Datril, Anacin-3 and Panadol. It is not blood thinner. The products listed below are.  Do not take any of the products listed below in addition to any listed on your instruction sheet.  A.P.C or A.P.C with Codeine Codeine Phosphate Capsules #3 Ibuprofen Ridaura  ABC compound Congesprin Imuran rimadil  Advil Cope Indocin Robaxisal  Alka-Seltzer Effervescent Pain Reliever and Antacid Coricidin or Coricidin-D  Indomethacin Rufen  Alka-Seltzer plus Cold Medicine Cosprin Ketoprofen S-A-C Tablets  Anacin Analgesic Tablets or Capsules Coumadin Korlgesic Salflex  Anacin Extra Strength Analgesic tablets or capsules CP-2 Tablets Lanoril Salicylate  Anaprox Cuprimine Capsules Levenox Salocol  Anexsia-D Dalteparin Magan Salsalate  Anodynos Darvon compound Magnesium Salicylate Sine-off  Ansaid Dasin Capsules Magsal Sodium Salicylate  Anturane Depen Capsules Marnal Soma  APF Arthritis pain formula Dewitt's Pills Measurin Stanback  Argesic Dia-Gesic Meclofenamic Sulfinpyrazone  Arthritis Bayer Timed Release Aspirin Diclofenac Meclomen Sulindac  Arthritis pain formula Anacin Dicumarol Medipren Supac  Analgesic (Safety coated) Arthralgen Diffunasal Mefanamic Suprofen  Arthritis Strength Bufferin Dihydrocodeine Mepro Compound Suprol   Arthropan liquid Dopirydamole Methcarbomol with Aspirin Synalgos  ASA tablets/Enseals Disalcid Micrainin Tagament  Ascriptin Doan's Midol Talwin  Ascriptin A/D Dolene Mobidin Tanderil  Ascriptin Extra Strength Dolobid Moblgesic Ticlid  Ascriptin with Codeine Doloprin or Doloprin with Codeine Momentum Tolectin  Asperbuf Duoprin Mono-gesic Trendar  Aspergum Duradyne Motrin or Motrin IB Triminicin  Aspirin plain, buffered or enteric coated Durasal Myochrisine Trigesic  Aspirin Suppositories Easprin Nalfon Trillsate  Aspirin with Codeine Ecotrin Regular or Extra Strength Naprosyn Uracel  Atromid-S Efficin Naproxen Ursinus  Auranofin Capsules Elmiron Neocylate Vanquish  Axotal Emagrin Norgesic Verin  Azathioprine Empirin or Empirin with Codeine Normiflo Vitamin E  Azolid Emprazil Nuprin Voltaren  Bayer Aspirin  plain, buffered or children's or timed BC Tablets or powders Encaprin Orgaran Warfarin Sodium  Buff-a-Comp Enoxaparin Orudis Zorpin  Buff-a-Comp with Codeine Equegesic Os-Cal-Gesic   Buffaprin Excedrin plain, buffered or Extra Strength Oxalid   Bufferin Arthritis Strength Feldene Oxphenbutazone   Bufferin plain or Extra Strength Feldene Capsules Oxycodone with Aspirin   Bufferin with Codeine Fenoprofen Fenoprofen Pabalate or Pabalate-SF   Buffets II Flogesic Panagesic   Buffinol plain or Extra Strength Florinal or Florinal with Codeine Panwarfarin   Buf-Tabs Flurbiprofen Penicillamine   Butalbital Compound Four-way cold tablets Penicillin   Butazolidin Fragmin Pepto-Bismol   Carbenicillin Geminisyn Percodan   Carna Arthritis Reliever Geopen Persantine   Carprofen Gold's salt Persistin   Chloramphenicol Goody's Phenylbutazone   Chloromycetin Haltrain Piroxlcam   Clmetidine heparin Plaquenil   Cllnoril Hyco-pap Ponstel   Clofibrate Hydroxy chloroquine Propoxyphen         Before stopping any of these medications, be sure to consult the physician who ordered them.  Some, such as  Coumadin (Warfarin) are ordered to prevent or treat serious conditions such as "deep thrombosis", "pumonary embolisms", and other heart problems.  The amount of time that you may need off of the medication may also vary with the medication and the reason for which you were taking it.  If you are taking any of these medications, please make sure you notify your pain physician before you undergo any procedures.         Pain Management Discharge Instructions  General Discharge Instructions :  If you need to reach your doctor call: Monday-Friday 8:00 am - 4:00 pm at [phone] or toll free [phone].  After clinic hours [phone] to have operator reach doctor.  Bring all of your medication bottles to all your appointments in the pain clinic.  To cancel or reschedule your appointment with Pain Management please remember to call 24 hours in advance to avoid a fee.  Refer to the educational materials which you have been given on: General Risks, I had my Procedure. Discharge Instructions, Post Sedation.  Post Procedure Instructions:  The drugs you were given will stay in your system until tomorrow, so for the next 24 hours you should not drive, make any legal decisions or drink any alcoholic beverages.  You may eat anything you prefer, but it is better to start with liquids then soups and crackers, and gradually work up to solid foods.  Please notify your doctor immediately if you have any unusual bleeding, trouble breathing or pain that is not related to your normal pain.  Depending on the type of procedure that was done, some parts of your body may feel week and/or numb.  This usually clears up by tonight or the next day.  Walk with the use of an assistive device or accompanied by an adult for the 24 hours.  You may use ice on the affected area for the first 24 hours.  Put ice in a Ziploc bag and cover with a towel and place against area 15 minutes on 15 minutes off.  You may  switch to heat after 24 hours.Facet Blocks Patient Information  Description: The facets are joints in the spine between the vertebrae.  Like any joints in the body, facets can become irritated and painful.  Arthritis can also effect the facets.  By injecting steroids and local anesthetic in and around these joints, we can temporarily block the nerve supply to them.  Steroids act directly on irritated nerves and tissues to reduce selling and  inflammation which often leads to decreased pain.  Facet blocks may be done anywhere along the spine from the neck to the low back depending upon the location of your pain.   After numbing the skin with local anesthetic (like Novocaine), a small needle is passed onto the facet joints under x-ray guidance.  You may experience a sensation of pressure while this is being done.  The entire block usually lasts about 15-25 minutes.   Conditions which may be treated by facet blocks:   Low back/buttock pain  Neck/shoulder pain  Certain types of headaches  Preparation for the injection:  1. Do not eat any solid food or dairy products within 8 hours of your appointment. 2. You may drink clear liquid up to 3 hours before appointment.  Clear liquids include water, black coffee, juice or soda.  No milk or cream please. 3. You may take your regular medication, including pain medications, with a sip of water before your appointment.  Diabetics should hold regular insulin (if taken separately) and take 1/2 normal NPH dose the morning of the procedure.  Carry some sugar containing items with you to your appointment. 4. A driver must accompany you and be prepared to drive you home after your procedure. 5. Bring all your current medications with you. 6. An IV may be inserted and sedation may be given at the discretion of the physician. 7. A blood pressure cuff, EKG and other monitors will often be applied during the procedure.  Some patients may need to have extra oxygen  administered for a short period. 8. You will be asked to provide medical information, including your allergies and medications, prior to the procedure.  We must know immediately if you are taking blood thinners (like Coumadin/Warfarin) or if you are allergic to IV iodine contrast (dye).  We must know if you could possible be pregnant.  Possible side-effects:   Bleeding from needle site  Infection (rare, may require surgery)  Nerve injury (rare)  Numbness & tingling (temporary)  Difficulty urinating (rare, temporary)  Spinal headache (a headache worse with upright posture)  Light-headedness (temporary)  Pain at injection site (serveral days)  Decreased blood pressure (rare, temporary)  Weakness in arm/leg (temporary)  Pressure sensation in back/neck (temporary)   Call if you experience:   Fever/chills associated with headache or increased back/neck pain  Headache worsened by an upright position  New onset, weakness or numbness of an extremity below the injection site  Hives or difficulty breathing (go to the emergency room)  Inflammation or drainage at the injection site(s)  Severe back/neck pain greater than usual  New symptoms which are concerning to you  Please note:  Although the local anesthetic injected can often make your back or neck feel good for several hours after the injection, the pain will likely return. It takes 3-7 days for steroids to work.  You may not notice any pain relief for at least one week.  If effective, we will often do a series of 2-3 injections spaced 3-6 weeks apart to maximally decrease your pain.  After the initial series, you may be a candidate for a more permanent nerve block of the facets.  If you have any questions, please call #[phone]) [facility]

## 2018-04-06 NOTE — Progress Notes (Signed)
Patient's Name: Karen Edwards  MRN: [id number]  Referring Provider: Pleas Koch, NP  DOB: 1966-06-26  PCP: Pleas Koch, NP  DOS: 04/06/2018  Note by: Gaspar Cola, MD  Service setting: Ambulatory outpatient  Specialty: Interventional Pain Management  Location: [facility] (AMB) Pain Management Facility    Patient type: Established   Primary Reason(s) for Visit: Encounter for post-procedure evaluation of chronic illness with mild to moderate exacerbation CC: Shoulder Pain (both)  HPI  Karen Edwards is a 52 y.o. year old, female patient, who comes today for a post-procedure evaluation. She has Chronic low back pain (Primary Area of Pain) (Bilateral) (R>L); Menorrhagia; Fatigue; DDD (degenerative disc disease), lumbar; DDD (degenerative disc disease), cervical; Chronic occipital neuralgia (Fifth Area of Pain) (Bilateral) (R>L); Lumbar facet syndrome (Bilateral) (L>R); DDD (degenerative disc disease), lumbosacral; Lumbar radicular pain (Right) (L4); Sacroiliac joint dysfunction (Bilateral); Depression; Hemorrhoids; Insomnia; Chronic constipation; Pharmacologic therapy; Hyperlipidemia, unspecified; Chronic neck pain (Fourth Area of Pain) (Bilateral) (R>L); GERD (gastroesophageal reflux disease); Numbness and tingling of upper extremity (Right); Chronic upper extremity pain (Bilateral) (R>L); Chronic sacroiliac joint pain (Bilateral) (L>R); Lumbar facet hypertrophy (Bilateral); L1-2 disc extrusion (Right); Cervical foraminal stenosis (C5-6) (Bilateral); Long term prescription opiate use; Opiate use; Disorder of skeletal system; Problems influencing health status; Chronic pain syndrome; Chronic cervical radicular pain (Right: C6/C7) (Left: C5/T1); Chronic lumbar radicular pain; Chronic hip pain (Tertiary Area of Pain) (Bilateral) (R>L); Chronic lower extremity pain (Secondary Area of Pain) (Bilateral) (R>L); Chronic shoulder pain (Bilateral) (L>R); Chronic musculoskeletal pain; Lumbar spondylosis;  Stress incontinence of urine; Cervical facet syndrome; Myofascial pain; Spondylosis without myelopathy or radiculopathy, cervical region; and Cervicalgia on their problem list. Her primarily concern today is the Shoulder Pain (both)  Pain Assessment: Location: Left, Right, Lower Shoulder Radiating: Radiates out to bilateral shoulder Onset: More than a month ago Duration: Chronic pain Quality: Constant, Stabbing, Aching, Burning, Discomfort, Sore Severity: 4 /10 (subjective, self-reported pain score)  Note: Reported level is compatible with observation.                         When using our objective Pain Scale, levels between 6 and 10/10 are said to belong in an emergency room, as it progressively worsens from a 6/10, described as severely limiting, requiring emergency care not usually available at an outpatient pain management facility. At a 6/10 level, communication becomes difficult and requires great effort. Assistance to reach the emergency department may be required. Facial flushing and profuse sweating along with potentially dangerous increases in heart rate and blood pressure will be evident. Effect on ADL: limits Daily activites Timing: Constant Modifying factors: medications , laying and getting up BP: (!) 101/56  HR: 80  Karen Edwards comes in today for post-procedure evaluation after the treatment done on 03/12/2018.  Further details on both, my assessment(s), as well as the proposed treatment plan, please see below.  Post-Procedure Assessment  03/10/2018 Procedure: Diagnostic bilateral cervical facet block #1 under fluoroscopic guidance and IV sedation Pre-procedure pain score:  3/10 Post-procedure pain score: 0/10 (100% relief) Influential Factors: BMI: 26.45 kg/m Intra-procedural challenges: None observed.         Assessment challenges: None detected.              Reported side-effects: None.        Post-procedural adverse reactions or complications: None reported          Sedation: Sedation provided. When no sedatives are used, the analgesic levels  obtained are directly associated to the effectiveness of the local anesthetics. However, when sedation is provided, the level of analgesia obtained during the initial 1 hour following the intervention, is believed to be the result of a combination of factors. These factors may include, but are not limited to: 1. The effectiveness of the local anesthetics used. 2. The effects of the analgesic(s) and/or anxiolytic(s) used. 3. The degree of discomfort experienced by the patient at the time of the procedure. 4. The patients ability and reliability in recalling and recording the events. 5. The presence and influence of possible secondary gains and/or psychosocial factors. Reported result: Relief experienced during the 1st hour after the procedure: 100 % (Ultra-Short Term Relief)            Interpretative annotation: Clinically appropriate result. Analgesia during this period is likely to be Local Anesthetic and/or IV Sedative (Analgesic/Anxiolytic) related.          Effects of local anesthetic: The analgesic effects attained during this period are directly associated to the localized infiltration of local anesthetics and therefore cary significant diagnostic value as to the etiological location, or anatomical origin, of the pain. Expected duration of relief is directly dependent on the pharmacodynamics of the local anesthetic used. Long-acting (4-6 hours) anesthetics used.  Reported result: Relief during the next 4 to 6 hour after the procedure: 100 % (Short-Term Relief)            Interpretative annotation: Clinically appropriate result. Analgesia during this period is likely to be Local Anesthetic-related.          Long-term benefit: Defined as the period of time past the expected duration of local anesthetics (1 hour for short-acting and 4-6 hours for long-acting). With the possible exception of prolonged sympathetic  blockade from the local anesthetics, benefits during this period are typically attributed to, or associated with, other factors such as analgesic sensory neuropraxia, antiinflammatory effects, or beneficial biochemical changes provided by agents other than the local anesthetics.  Reported result: Extended relief following procedure: 90 % (Long-Term Relief)            Interpretative annotation: Clinically appropriate result. Good relief. No permanent benefit expected. Inflammation plays a part in the etiology to the pain.          Current benefits: Defined as reported results that persistent at this point in time.   Analgesia: 90 %            Function: Karen Edwards reports improvement in function ROM: Karen Edwards reports improvement in ROM Interpretative annotation: Ongoing benefit. Therapeutic success. Effective therapeutic approach.          Interpretation: Results would suggest a successful diagnostic intervention.                  Plan:  Set up procedure as a PRN palliative treatment option for this patient.                Laboratory Chemistry  Inflammation Markers (CRP: Acute Phase) (ESR: Chronic Phase) Lab Results  Component Value Date   CRP 1.4 09/08/2017   ESRSEDRATE 11 09/08/2017                         Renal Markers Lab Results  Component Value Date   BUN 7 09/08/2017   CREATININE 0.84 09/08/2017   BCR 8 (L) 09/08/2017   GFRAA 93 09/08/2017   GFRNONAA 81 09/08/2017  Hepatic Markers Lab Results  Component Value Date   AST 18 09/08/2017   ALT 12 (L) 01/30/2017   ALBUMIN 4.7 09/08/2017                        Hematology Parameters Lab Results  Component Value Date   PLT 202.0 05/20/2017   HGB 13.5 05/20/2017   HCT 40.5 05/20/2017                        CV Markers Lab Results  Component Value Date   TROPONINI <0.03 01/30/2017                         Note: Lab results reviewed.  Recent Diagnostic Imaging Results  DG C-Arm 1-60  Min-No Report Fluoroscopy was utilized by the requesting physician.  No radiographic  interpretation.   Complexity Note: I personally reviewed the fluoroscopic imaging of the procedure.                        Meds   Current Outpatient Medications:  .  atorvastatin (LIPITOR) 10 MG tablet, Take 1 tablet (10 mg total) by mouth daily., Disp: 90 tablet, Rfl: 3 .  Cyanocobalamin (B-12 PO), Take 2 tablets by mouth every other day. , Disp: , Rfl:  .  FLUoxetine (PROZAC) 20 MG capsule, Take 3 capsules (60 mg total) by mouth daily., Disp: 270 capsule, Rfl: 3 .  Green Tea, Camillia sinensis, (GREEN TEA PO), Take 2 capsules by mouth daily. , Disp: , Rfl:  .  [START ON 04/14/2018] HYDROcodone-acetaminophen (NORCO/VICODIN) 5-325 MG tablet, Take 1 tablet by mouth 2 (two) times daily., Disp: 60 tablet, Rfl: 0 .  methocarbamol (ROBAXIN) 500 MG tablet, Take 1 tablet (500 mg total) by mouth every 8 (eight) hours as needed for muscle spasms., Disp: 90 tablet, Rfl: 1 .  Potassium 99 MG TABS, Take 1 tablet by mouth daily., Disp: , Rfl:  .  traZODone (DESYREL) 100 MG tablet, Take 3 tablets (300 mg total) by mouth at bedtime., Disp: 270 tablet, Rfl: 3  ROS  Constitutional: Denies any fever or chills Gastrointestinal: No reported hemesis, hematochezia, vomiting, or acute GI distress Musculoskeletal: Denies any acute onset joint swelling, redness, loss of ROM, or weakness Neurological: No reported episodes of acute onset apraxia, aphasia, dysarthria, agnosia, amnesia, paralysis, loss of coordination, or loss of consciousness  Allergies  Karen Edwards is allergic to no known allergies.  [place]  Drug: Karen Edwards  reports that she does not use drugs. Alcohol:  reports that she drinks about 0.6 oz of alcohol per week. Tobacco:  reports that she has been smoking cigarettes.  She has a 3.70 pack-year smoking history. She has never used smokeless tobacco. Medical:  has a past medical history of Anxiety, Chronic back  pain, Degenerative joint disease, Depression, Hypotension, IBS (irritable bowel syndrome) (05/2015), and MVP (mitral valve prolapse). Surgical: Ms. Ranieri  has a past surgical history that includes Cesarean section; Ankle surgery (Right, 03/02/2014); Hemorrhoid surgery (N/A, 06/16/2015); Cervical spine surgery (02/21/2016); Cesarean section with bilateral tubal ligation (1990); Colonoscopy (04/19/2015); and Hysteroscopy w/D&C (12/24/2013). Family: family history includes Arthritis in her mother; Congestive Heart Failure in her maternal grandfather; Depression in her mother; Diabetes in her maternal aunt; Diabetes Mellitus I in her maternal grandmother; Gout in her maternal aunt; Hearing loss in her father; Heart attack in her maternal grandfather; Hyperlipidemia in  her father and mother; Hypertension in her father and mother; Hypothyroidism in her mother; Kidney disease in her mother; Prostate cancer in her paternal grandfather; Stroke in her maternal grandmother; Thyroid disease in her maternal aunt; Vision loss in her mother.  Constitutional Exam  General appearance: Well nourished, well developed, and well hydrated. In no apparent acute distress Vitals:   04/06/18 1312  BP: (!) 101/56  Pulse: 80  Temp: 98.3 F (36.8 C)  SpO2: 97%  Weight: 140 lb (63.5 kg)  Height: _0  (1.549 m)   BMI Assessment: Estimated body mass index is 26.45 kg/m as calculated from the following:   Height as of this encounter: _1  (1.549 m).   Weight as of this encounter: 140 lb (63.5 kg).  BMI interpretation table: BMI level Category Range association with higher incidence of chronic pain  <18 kg/m2 Underweight   18.5-24.9 kg/m2 Ideal body weight   25-29.9 kg/m2 Overweight Increased incidence by 20%  30-34.9 kg/m2 Obese (Class I) Increased incidence by 68%  35-39.9 kg/m2 Severe obesity (Class II) Increased incidence by 136%  >40 kg/m2 Extreme obesity (Class III) Increased incidence by 254%   Patient's  current BMI Ideal Body weight  Body mass index is 26.45 kg/m. Ideal body weight: 47.8 kg (105 lb 6.1 oz) Adjusted ideal body weight: 54.1 kg (119 lb 3.7 oz)   BMI Readings from Last 4 Encounters:  04/06/18 26.45 kg/m  03/16/18 27.07 kg/m  03/10/18 26.45 kg/m  03/09/18 26.45 kg/m   Wt Readings from Last 4 Encounters:  04/06/18 140 lb (63.5 kg)  03/16/18 143 lb 4 oz (65 kg)  03/10/18 140 lb (63.5 kg)  03/09/18 140 lb (63.5 kg)  Psych/Mental status: Alert, oriented x 3 (person, place, & time)       Eyes: PERLA Respiratory: No evidence of acute respiratory distress  Cervical Spine Area Exam  Skin & Axial Inspection: No masses, redness, edema, swelling, or associated skin lesions Alignment: Symmetrical Functional ROM: Diminished ROM      Stability: No instability detected Muscle Tone/Strength: Functionally intact. No obvious neuro-muscular anomalies detected. Sensory (Neurological): Improved Palpation: Tender              Upper Extremity (UE) Exam    Side: Right upper extremity  Side: Left upper extremity  Skin & Extremity Inspection: Skin color, temperature, and hair growth are WNL. No peripheral edema or cyanosis. No masses, redness, swelling, asymmetry, or associated skin lesions. No contractures.  Skin & Extremity Inspection: Skin color, temperature, and hair growth are WNL. No peripheral edema or cyanosis. No masses, redness, swelling, asymmetry, or associated skin lesions. No contractures.  Functional ROM: Unrestricted ROM          Functional ROM: Unrestricted ROM          Muscle Tone/Strength: Functionally intact. No obvious neuro-muscular anomalies detected.  Muscle Tone/Strength: Functionally intact. No obvious neuro-muscular anomalies detected.  Sensory (Neurological): Unimpaired          Sensory (Neurological): Unimpaired          Palpation: No palpable anomalies              Palpation: No palpable anomalies              Provocative Test(s):  Phalen's test:  deferred Tinel's test: deferred Apley's scratch test (touch opposite shoulder):  Action 1 (Across chest): deferred Action 2 (Overhead): deferred Action 3 (LB reach): deferred   Provocative Test(s):  Phalen's test: deferred Tinel's test: deferred Apley's scratch  test (touch opposite shoulder):  Action 1 (Across chest): deferred Action 2 (Overhead): deferred Action 3 (LB reach): deferred    Thoracic Spine Area Exam  Skin & Axial Inspection: No masses, redness, or swelling Alignment: Symmetrical Functional ROM: Unrestricted ROM Stability: No instability detected Muscle Tone/Strength: Functionally intact. No obvious neuro-muscular anomalies detected. Sensory (Neurological): Unimpaired Muscle strength & Tone: No palpable anomalies  Lumbar Spine Area Exam  Skin & Axial Inspection: No masses, redness, or swelling Alignment: Symmetrical Functional ROM: Unrestricted ROM       Stability: No instability detected Muscle Tone/Strength: Functionally intact. No obvious neuro-muscular anomalies detected. Sensory (Neurological): Unimpaired Palpation: No palpable anomalies       Provocative Tests: Lumbar Hyperextension/rotation test: deferred today       Lumbar quadrant test (Kemp's test): deferred today       Lumbar Lateral bending test: deferred today       Patrick's Maneuver: deferred today                   FABER test: deferred today       Thigh-thrust test: deferred today       S-I compression test: deferred today       S-I distraction test: deferred today        Gait & Posture Assessment  Ambulation: Patient ambulates using a cane Gait: Limited. Using assistive device to ambulate Posture: Antalgic   Lower Extremity Exam    Side: Right lower extremity  Side: Left lower extremity  Stability: No instability observed          Stability: No instability observed          Skin & Extremity Inspection: Skin color, temperature, and hair growth are WNL. No peripheral edema or cyanosis. No  masses, redness, swelling, asymmetry, or associated skin lesions. No contractures.  Skin & Extremity Inspection: Skin color, temperature, and hair growth are WNL. No peripheral edema or cyanosis. No masses, redness, swelling, asymmetry, or associated skin lesions. No contractures.  Functional ROM: Unrestricted ROM                  Functional ROM: Unrestricted ROM                  Muscle Tone/Strength: Functionally intact. No obvious neuro-muscular anomalies detected.  Muscle Tone/Strength: Functionally intact. No obvious neuro-muscular anomalies detected.  Sensory (Neurological): Unimpaired  Sensory (Neurological): Unimpaired  Palpation: No palpable anomalies  Palpation: No palpable anomalies   Assessment  Primary Diagnosis & Pertinent Problem List: The primary encounter diagnosis was Spondylosis without myelopathy or radiculopathy, cervical region. Diagnoses of Cervical facet syndrome, Chronic neck pain (Fourth Area of Pain) (Bilateral) (R>L), Cervicalgia, and Chronic occipital neuralgia (Fifth Area of Pain) (Bilateral) (R>L) were also pertinent to this visit.  Status Diagnosis  Improving Improving Improving 1. Spondylosis without myelopathy or radiculopathy, cervical region   2. Cervical facet syndrome   3. Chronic neck pain (Fourth Area of Pain) (Bilateral) (R>L)   4. Cervicalgia   5. Chronic occipital neuralgia (Fifth Area of Pain) (Bilateral) (R>L)     Problems updated and reviewed during this visit: No problems updated. Plan of Care  Pharmacotherapy (Medications Ordered): Meds ordered this encounter  Medications  . HYDROcodone-acetaminophen (NORCO/VICODIN) 5-325 MG tablet    Sig: Take 1 tablet by mouth 2 (two) times daily.    Dispense:  60 tablet    Refill:  0    Do not place this medication, or any other prescription from our practice,  on "Automatic Refill". Patient may have prescription filled one day early if pharmacy is closed on scheduled refill date. Do not fill  until:04/14/2018 To last until:05/14/2018    Order Specific Question:   Supervising Provider    Answer:   Milinda Pointer [phone]   Medications administered today: Karen Edwards had no medications administered during this visit.   Procedure Orders     CERVICAL FACET (MEDIAL BRANCH NERVE BLOCK)      CERVICAL FACET (MEDIAL BRANCH NERVE BLOCK)  Lab Orders  No laboratory test(s) ordered today   Imaging Orders  No imaging studies ordered today   Referral Orders  No referral(s) requested today    Interventional management options: Planned, scheduled, and/or pending:   Diagnostic bilateral cervical facet block #2 under fluoroscopic guidance and IV sedation. (PRN) The patient was sent to have a medical psychology evaluation for implants.  Should the patient proved to be a good candidate, we will go ahead and do a spinal cord stimulator trial in the thoracolumbar region.    Considering:   Possible bilateral lumbar spinal cord stimulator trial  Diagnostic bilateral lumbar facet block Possible bilateral lumbar facet RFA Diagnostic bilateral sacroiliac joint block Possible bilateral sacroiliac joint RFA Diagnostic bilateral intra-articular hip joint injection Diagnostic bilateral femoral nerve block + obturator nerve block Possible bilateral femoral nerve + obturator nerve RFA Diagnostic right-sided L1-2lumbar epidural steroid injection Diagnostic right-sided L1-2transforaminal epidural steroid injection Diagnostic bilateralL4-5lumbar transforaminal epidural steroid injection Diagnostic bilateral cervical facet block Possible bilateral cervical facet RFA Diagnostic right-sided cervical epidural steroid injection Diagnostic bilateral greater occipital nerve block Possible bilateral occipital nerve RFA Diagnostic bilateral intra-articular shoulder joint injection Diagnostic bilateral suprascapular nerve block Possible bilateral suprascapular nerve RFA    Palliative PRN treatment(s):   Diagnostic bilateral cervical facet block #2  Diagnostic right-sided L1-2 interlaminar LESI+  right-sided L1 TFESI   Provider-requested follow-up: Return for Procedure (w/ sedation): (B) C-FCT BLK #2.  Future Appointments  Date Time Provider [place]  04/14/2018 12:45 PM Milinda Pointer, MD [facility] None  04/27/2018  3:00 PM [facility] None  05/11/2018  2:15 PM Vanga, Tally Due, MD [facility] None   Primary Care Physician: Pleas Koch, NP Location: [facility] Note by: Gaspar Cola, MD Date: 04/06/2018; Time: 1:42 PM

## 2018-04-06 NOTE — Progress Notes (Signed)
Nursing Pain Medication Assessment:  Safety precautions to be maintained throughout the outpatient stay will include: orient to surroundings, keep bed in low position, maintain call bell within reach at all times, provide assistance with transfer out of bed and ambulation.  Medication Inspection Compliance: Pill count conducted under aseptic conditions, in front of the patient. Neither the pills nor the bottle was removed from the patient's sight at any time. Once count was completed pills were immediately returned to the patient in their original bottle.  Medication: Hydrocodone/APAP Pill/Patch Count: 0 of 60 pills remain Pill/Patch Appearance: Markings consistent with prescribed medication Bottle Appearance: Standard pharmacy container. Clearly labeled. Filled Date: 04 / 22 / 2019 Last Medication intake:  Ran out of medicine more than 48 hours ago

## 2018-04-07 ENCOUNTER — Encounter: Payer: PPO | Admitting: Nurse Practitioner

## 2018-04-09 ENCOUNTER — Ambulatory Visit: Payer: PPO | Admitting: Pain Medicine

## 2018-04-10 LAB — TOXASSURE SELECT 13 (MW), URINE

## 2018-04-13 ENCOUNTER — Telehealth: Payer: Self-pay

## 2018-04-13 ENCOUNTER — Other Ambulatory Visit: Payer: Self-pay | Admitting: Pain Medicine

## 2018-04-13 DIAGNOSIS — M79605 Pain in left leg: Secondary | ICD-10-CM

## 2018-04-13 DIAGNOSIS — M5442 Lumbago with sciatica, left side: Principal | ICD-10-CM

## 2018-04-13 DIAGNOSIS — G8929 Other chronic pain: Secondary | ICD-10-CM

## 2018-04-13 DIAGNOSIS — M5441 Lumbago with sciatica, right side: Principal | ICD-10-CM

## 2018-04-13 DIAGNOSIS — M5416 Radiculopathy, lumbar region: Secondary | ICD-10-CM

## 2018-04-13 DIAGNOSIS — F0634 Mood disorder due to known physiological condition with mixed features: Secondary | ICD-10-CM | POA: Diagnosis not present

## 2018-04-13 DIAGNOSIS — M79604 Pain in right leg: Secondary | ICD-10-CM

## 2018-04-13 NOTE — Progress Notes (Unsigned)
The patient was found to be a good candidate for a spinal course similar trial, according to the medical psychology evaluation. Therefore, we will proceed with scheduling this trial. (Bilateral thoracolumbar SCS trial).

## 2018-04-13 NOTE — Telephone Encounter (Signed)
Will get Dr. Dossie Arbour to do this.

## 2018-04-13 NOTE — Progress Notes (Signed)
Patient's Name: Karen Edwards  MRN: [id number]  Referring Provider: Pleas Koch, NP  DOB: 1966/05/21  PCP: Pleas Koch, NP  DOS: 04/14/2018  Note by: Gaspar Cola, MD  Service setting: Ambulatory outpatient  Specialty: Interventional Pain Management  Patient type: Established  Location: [facility] (AMB) Pain Management Facility  Visit type: Interventional Procedure   Primary Reason for Visit: Interventional Pain Management Treatment. CC: Neck Pain (bilateral)  Procedure:       Anesthesia, Analgesia, Anxiolysis:  Type: Cervical Facet Medial Branch Block(s) #2  Primary Purpose: Diagnostic Region: Posterolateral cervical spine Level: C3, C4, C5, C6, & C7 Medial Branch Level(s). Injecting these levels blocks the C3-4, C4-5, C5-6, and C6-7 cervical facet joints. Laterality: Bilateral Paraspinal  Type: Moderate (Conscious) Sedation combined with Local Anesthesia Indication(s): Analgesia and Anxiety Route: Intravenous (IV) IV Access: Secured Sedation: Meaningful verbal contact was maintained at all times during the procedure  Local Anesthetic: Lidocaine 1-2%   Indications: 1. Spondylosis without myelopathy or radiculopathy, cervical region   2. Cervical facet syndrome   3. Cervicalgia   4. Chronic neck pain (Fourth Area of Pain) (Bilateral) (R>L)   5. DDD (degenerative disc disease), cervical    Pain Score: Pre-procedure: 4 /10 Post-procedure: 0-No pain/10  Pre-op Assessment:  Karen Edwards is a 52 y.o. (year old), female patient, seen today for interventional treatment. She  has a past surgical history that includes Cesarean section; Ankle surgery (Right, 03/02/2014); Hemorrhoid surgery (N/A, 06/16/2015); Cervical spine surgery (02/21/2016); Cesarean section with bilateral tubal ligation (1990); Colonoscopy (04/19/2015); and Hysteroscopy w/D&C (12/24/2013). Karen Edwards has a current medication list which includes the following prescription(s): atorvastatin, cyanocobalamin,  fluoxetine, green tea (camellia sinensis), hydrocodone-acetaminophen, methocarbamol, potassium, and trazodone, and the following Facility-Administered Medications: fentanyl, lactated ringers, and midazolam. Her primarily concern today is the Neck Pain (bilateral)  Initial Vital Signs:  Pulse/HCG Rate: 77ECG Heart Rate: 78 Temp: 98.8 F (37.1 C) Resp: 16 BP: 111/64 SpO2: 98 %  BMI: Estimated body mass index is 26.45 kg/m as calculated from the following:   Height as of this encounter: 5\' 1"  (1.549 m).   Weight as of this encounter: 140 lb (63.5 kg).  Risk Assessment: Allergies: Reviewed. She is allergic to no known allergies.  Allergy Precautions: None required Coagulopathies: Reviewed. None identified.  Blood-thinner therapy: None at this time Active Infection(s): Reviewed. None identified. Karen Edwards is afebrile  Site Confirmation: Karen Edwards was asked to confirm the procedure and laterality before marking the site Procedure checklist: Completed Consent: Before the procedure and under the influence of no sedative(s), amnesic(s), or anxiolytics, the patient was informed of the treatment options, risks and possible complications. To fulfill our ethical and legal obligations, as recommended by the American Medical Association's Code of Ethics, I have informed the patient of my clinical impression; the nature and purpose of the treatment or procedure; the risks, benefits, and possible complications of the intervention; the alternatives, including doing nothing; the risk(s) and benefit(s) of the alternative treatment(s) or procedure(s); and the risk(s) and benefit(s) of doing nothing. The patient was provided information about the general risks and possible complications associated with the procedure. These may include, but are not limited to: failure to achieve desired goals, infection, bleeding, organ or nerve damage, allergic reactions, paralysis, and death. In addition, the patient was  informed of those risks and complications associated to Spine-related procedures, such as failure to decrease pain; infection (i.e.: Meningitis, epidural or intraspinal abscess); bleeding (i.e.: epidural hematoma, subarachnoid hemorrhage, or any  other type of intraspinal or peri-dural bleeding); organ or nerve damage (i.e.: Any type of peripheral nerve, nerve root, or spinal cord injury) with subsequent damage to sensory, motor, and/or autonomic systems, resulting in permanent pain, numbness, and/or weakness of one or several areas of the body; allergic reactions; (i.e.: anaphylactic reaction); and/or death. Furthermore, the patient was informed of those risks and complications associated with the medications. These include, but are not limited to: allergic reactions (i.e.: anaphylactic or anaphylactoid reaction(s)); adrenal axis suppression; blood sugar elevation that in diabetics may result in ketoacidosis or comma; water retention that in patients with history of congestive heart failure may result in shortness of breath, pulmonary edema, and decompensation with resultant heart failure; weight gain; swelling or edema; medication-induced neural toxicity; particulate matter embolism and blood vessel occlusion with resultant organ, and/or nervous system infarction; and/or aseptic necrosis of one or more joints. Finally, the patient was informed that Medicine is not an exact science; therefore, there is also the possibility of unforeseen or unpredictable risks and/or possible complications that may result in a catastrophic outcome. The patient indicated having understood very clearly. We have given the patient no guarantees and we have made no promises. Enough time was given to the patient to ask questions, all of which were answered to the patient's satisfaction. Karen Edwards has indicated that she wanted to continue with the procedure. Attestation: I, the ordering provider, attest that I have discussed with the  patient the benefits, risks, side-effects, alternatives, likelihood of achieving goals, and potential problems during recovery for the procedure that I have provided informed consent. Date  Time: 04/14/2018 12:44 PM  Pre-Procedure Preparation:  Monitoring: As per clinic protocol. Respiration, ETCO2, SpO2, BP, heart rate and rhythm monitor placed and checked for adequate function Safety Precautions: Patient was assessed for positional comfort and pressure points before starting the procedure. Time-out: I initiated and conducted the "Time-out" before starting the procedure, as per protocol. The patient was asked to participate by confirming the accuracy of the "Time Out" information. Verification of the correct person, site, and procedure were performed and confirmed by me, the nursing staff, and the patient. "Time-out" conducted as per Joint Commission's Universal Protocol (UP.01.01.01). Time: 1311  Description of Procedure:       Position: Prone with head of the table raised to facilitate breathing. Laterality: Bilateral. The procedure was performed in identical fashion on both sides. Level: C3, C4, C5, C6, & C7 Medial Branch Level(s). Area Prepped: Posterior Cervico-thoracic Region Prepping solution: ChloraPrep (2% chlorhexidine gluconate and 70% isopropyl alcohol) Safety Precautions: Aspiration looking for blood return was conducted prior to all injections. At no point did we inject any substances, as a needle was being advanced. Before injecting, the patient was told to immediately notify me if she was experiencing any new onset of "ringing in the ears, or metallic taste in the mouth". No attempts were made at seeking any paresthesias. Safe injection practices and needle disposal techniques used. Medications properly checked for expiration dates. SDV (single dose vial) medications used. After the completion of the procedure, all disposable equipment used was discarded in the proper designated  medical waste containers. Local Anesthesia: Protocol guidelines were followed. The patient was positioned over the fluoroscopy table. The area was prepped in the usual manner. The time-out was completed. The target area was identified using fluoroscopy. A 12-in long, straight, sterile hemostat was used with fluoroscopic guidance to locate the targets for each level blocked. Once located, the skin was marked with an approved  surgical skin marker. Once all sites were marked, the skin (epidermis, dermis, and hypodermis), as well as deeper tissues (fat, connective tissue and muscle) were infiltrated with a small amount of a short-acting local anesthetic, loaded on a 10cc syringe with a 25G, 1.5-in  Needle. An appropriate amount of time was allowed for local anesthetics to take effect before proceeding to the next step. Local Anesthetic: Lidocaine 2.0% The unused portion of the local anesthetic was discarded in the proper designated containers. Technical explanation of process:  C3 Medial Branch Nerve Block (MBB): The target area for the C3 dorsal medial articular branch is the lateral concave waist of the articular pillar of C3. Under fluoroscopic guidance, a Quincke needle was inserted until contact was made with os over the postero-lateral aspect of the articular pillar of C3 (target area). After negative aspiration for blood, 0.5 mL of the nerve block solution was injected without difficulty or complication. The needle was removed intact. C4 Medial Branch Nerve Block (MBB): The target area for the C4 dorsal medial articular branch is the lateral concave waist of the articular pillar of C4. Under fluoroscopic guidance, a Quincke needle was inserted until contact was made with os over the postero-lateral aspect of the articular pillar of C4 (target area). After negative aspiration for blood, 0.5 mL of the nerve block solution was injected without difficulty or complication. The needle was removed intact. C5  Medial Branch Nerve Block (MBB): The target area for the C5 dorsal medial articular branch is the lateral concave waist of the articular pillar of C5. Under fluoroscopic guidance, a Quincke needle was inserted until contact was made with os over the postero-lateral aspect of the articular pillar of C5 (target area). After negative aspiration for blood, 0.5 mL of the nerve block solution was injected without difficulty or complication. The needle was removed intact. C6 Medial Branch Nerve Block (MBB): The target area for the C6 dorsal medial articular branch is the lateral concave waist of the articular pillar of C6. Under fluoroscopic guidance, a Quincke needle was inserted until contact was made with os over the postero-lateral aspect of the articular pillar of C6 (target area). After negative aspiration for blood, 0.5 mL of the nerve block solution was injected without difficulty or complication. The needle was removed intact. C7 Medial Branch Nerve Block (MBB): The target for the C7 dorsal medial articular branch lies on the superior-medial tip of the C7 transverse process. Under fluoroscopic guidance, a Quincke needle was inserted until contact was made with os over the postero-lateral aspect of the articular pillar of C7 (target area). After negative aspiration for blood, 0.5 mL of the nerve block solution was injected without difficulty or complication. The needle was removed intact. Procedural Needles: 22-gauge, 3.5-inch, Quincke needles used for all levels. Nerve block solution: 0.2% PF-Ropivacaine + Triamcinolone (40 mg/mL) diluted to a final concentration of 4 mg of Triamcinolone/mL of Ropivacaine The unused portion of the solution was discarded in the proper designated containers.  Once the entire procedure was completed, the treated area was cleaned, making sure to leave some of the prepping solution back to take advantage of its long term bactericidal properties.  Vitals:   04/14/18 1315  04/14/18 1322 04/14/18 1326 04/14/18 1329  BP: 119/75 119/64 116/72 109/71  Pulse:      Resp: 18 15 15 14   Temp:    97.9 F (36.6 C)  TempSrc:    Temporal  SpO2: 95% 100% 95% 93%  Weight:  Height:        Start Time: 1313 hrs. End Time: 1324 hrs.  Imaging Guidance (Spinal):  Type of Imaging Technique: Fluoroscopy Guidance (Spinal) Indication(s): Assistance in needle guidance and placement for procedures requiring needle placement in or near specific anatomical locations not easily accessible without such assistance. Exposure Time: Please see nurses notes. Contrast: None used. Fluoroscopic Guidance: I was personally present during the use of fluoroscopy. "Tunnel Vision Technique" used to obtain the best possible view of the target area. Parallax error corrected before commencing the procedure. "Direction-depth-direction" technique used to introduce the needle under continuous pulsed fluoroscopy. Once target was reached, antero-posterior, oblique, and lateral fluoroscopic projection used confirm needle placement in all planes. Images permanently stored in EMR. Interpretation: No contrast injected. I personally interpreted the imaging intraoperatively. Adequate needle placement confirmed in multiple planes. Permanent images saved into the patient's record.  Antibiotic Prophylaxis:   Anti-infectives (From admission, onward)   None     Indication(s): None identified  Post-operative Assessment:  Post-procedure Vital Signs:  Pulse/HCG Rate: 7765 Temp: 97.9 F (36.6 C) Resp: 14 BP: 109/71 SpO2: 93 %  EBL: None  Complications: No immediate post-treatment complications observed by team, or reported by patient.  Note: The patient tolerated the entire procedure well. A repeat set of vitals were taken after the procedure and the patient was kept under observation following institutional policy, for this type of procedure. Post-procedural neurological assessment was performed, showing  return to baseline, prior to discharge. The patient was provided with post-procedure discharge instructions, including a section on how to identify potential problems. Should any problems arise concerning this procedure, the patient was given instructions to immediately contact us, at any time, without hesitation. In any case, we plan to contact the patient by telephone for a follow-up status report regarding this interventional procedure.  Comments:  From now on, we will try to keep her sedation to normal within 2 mg of Versed and 25 g of fentanyl since she does not follow commands if higher sedation is used. On today's procedure, she reached to the back of her head and contaminated the equipment that we are using.  Plan of Care    Imaging Orders     DG C-Arm 1-60 Min-No Report  Procedure Orders     CERVICAL FACET (MEDIAL BRANCH NERVE BLOCK)   Medications ordered for procedure: Meds ordered this encounter  Medications  . lidocaine (XYLOCAINE) 2 % (with pres) injection 400 mg  . midazolam (VERSED) 5 MG/5ML injection 1-2 mg    Make sure Flumazenil is available in the pyxis when using this medication. If oversedation occurs, administer 0.2 mg IV over 15 sec. If after 45 sec no response, administer 0.2 mg again over 1 min; may repeat at 1 min intervals; not to exceed 4 doses (1 mg)  . fentaNYL (SUBLIMAZE) injection 25-50 mcg    Make sure Narcan is available in the pyxis when using this medication. In the event of respiratory depression (RR< 8/min): Titrate NARCAN (naloxone) in increments of 0.1 to 0.2 mg IV at 2-3 minute intervals, until desired degree of reversal.  . lactated ringers infusion 1,000 mL  . ropivacaine (PF) 2 mg/mL (0.2%) (NAROPIN) injection 9 mL  . dexamethasone (DECADRON) injection 10 mg  . ropivacaine (PF) 2 mg/mL (0.2%) (NAROPIN) injection 9 mL  . dexamethasone (DECADRON) injection 10 mg   Medications administered: We administered lidocaine, midazolam, fentaNYL, lactated  ringers, ropivacaine (PF) 2 mg/mL (0.2%), dexamethasone, ropivacaine (PF) 2 mg/mL (0.2%), and dexamethasone.  See the medical record for exact dosing, route, and time of administration.  New Prescriptions   No medications on file   Disposition: Discharge home  Discharge Date & Time: 04/14/2018; 1400 hrs.   Physician-requested Follow-up: Return for post-procedure eval (2 wks), w/ Dr. Dossie Arbour.  Future Appointments  Date Time Provider Wanda  04/27/2018  3:00 PM [facility] None  05/11/2018 10:15 AM Milinda Pointer, MD [facility] None  05/11/2018  2:15 PM Vanga, Tally Due, MD [facility] None   Primary Care Physician: Pleas Koch, NP Location: [facility] Note by: Gaspar Cola, MD Date: 04/14/2018; Time: 1:44 PM  Disclaimer:  Medicine is not an Chief Strategy Officer. The only guarantee in medicine is that nothing is guaranteed. It is important to note that the decision to proceed with this intervention was based on the information collected from the patient. The Data and conclusions were drawn from the patient's questionnaire, the interview, and the physical examination. Because the information was provided in large part by the patient, it cannot be guaranteed that it has not been purposely or unconsciously manipulated. Every effort has been made to obtain as much relevant data as possible for this evaluation. It is important to note that the conclusions that lead to this procedure are derived in large part from the available data. Always take into account that the treatment will also be dependent on availability of resources and existing treatment guidelines, considered by other Pain Management Practitioners as being common knowledge and practice, at the time of the intervention. For Medico-Legal purposes, it is also important to point out that variation in procedural techniques and pharmacological choices are the acceptable norm.  The indications, contraindications, technique, and results of the above procedure should only be interpreted and judged by a Board-Certified Interventional Pain Specialist with extensive familiarity and expertise in the same exact procedure and technique.

## 2018-04-13 NOTE — Telephone Encounter (Signed)
The patient called to let us know she got her [facility] p[facility] eval done and Dr. Arlana Pouch told her she could call and tell us she is a good candidate for the SCS. If Dr. Dossie Arbour wants to proceed with this I will need for him to put an order in so I can start the prior authorization request. I see in his last notes that this was the plan but he didn't put in the  order.

## 2018-04-14 ENCOUNTER — Ambulatory Visit
Admission: RE | Admit: 2018-04-14 | Discharge: 2018-04-14 | Disposition: A | Discharge status: Home / Self Care | Payer: PPO | Source: Ambulatory Visit | Attending: Pain Medicine | Admitting: Pain Medicine

## 2018-04-14 ENCOUNTER — Encounter: Payer: Self-pay | Admitting: Pain Medicine

## 2018-04-14 ENCOUNTER — Other Ambulatory Visit: Payer: Self-pay

## 2018-04-14 ENCOUNTER — Ambulatory Visit (HOSPITAL_BASED_OUTPATIENT_CLINIC_OR_DEPARTMENT_OTHER): Payer: PPO | Admitting: Pain Medicine

## 2018-04-14 VITALS — BP 124/66 | HR 77 | Temp 97.5°F | Resp 17 | Ht 61.0 in | Wt 140.0 lb

## 2018-04-14 DIAGNOSIS — Z9889 Other specified postprocedural states: Secondary | ICD-10-CM | POA: Diagnosis not present

## 2018-04-14 DIAGNOSIS — M5030 Other cervical disc degeneration, unspecified cervical region: Secondary | ICD-10-CM | POA: Insufficient documentation

## 2018-04-14 DIAGNOSIS — M47812 Spondylosis without myelopathy or radiculopathy, cervical region: Secondary | ICD-10-CM

## 2018-04-14 DIAGNOSIS — G8929 Other chronic pain: Secondary | ICD-10-CM | POA: Insufficient documentation

## 2018-04-14 DIAGNOSIS — M542 Cervicalgia: Secondary | ICD-10-CM | POA: Diagnosis present

## 2018-04-14 MED ORDER — LIDOCAINE HCL 2 % IJ SOLN
20.0000 mL | Freq: Once | INTRAMUSCULAR | Status: AC
  Administered 2018-04-14: 400 mg

## 2018-04-14 MED ORDER — FENTANYL CITRATE (PF) 100 MCG/2ML IJ SOLN
25.0000 ug | INTRAMUSCULAR | Status: DC | PRN
  Administered 2018-04-14: 100 ug via INTRAVENOUS

## 2018-04-14 MED ORDER — DEXAMETHASONE SODIUM PHOSPHATE 10 MG/ML IJ SOLN
10.0000 mg | Freq: Once | INTRAMUSCULAR | Status: AC
  Administered 2018-04-14: 10 mg

## 2018-04-14 MED ORDER — MIDAZOLAM HCL 5 MG/5ML IJ SOLN
INTRAMUSCULAR | Status: AC
  Filled 2018-04-14: qty 5

## 2018-04-14 MED ORDER — MIDAZOLAM HCL 5 MG/5ML IJ SOLN
1.0000 mg | INTRAMUSCULAR | Status: DC | PRN
  Administered 2018-04-14: 4 mg via INTRAVENOUS

## 2018-04-14 MED ORDER — DEXAMETHASONE SODIUM PHOSPHATE 10 MG/ML IJ SOLN
INTRAMUSCULAR | Status: AC
  Filled 2018-04-14: qty 1

## 2018-04-14 MED ORDER — ROPIVACAINE HCL 2 MG/ML IJ SOLN
9.0000 mL | Freq: Once | INTRAMUSCULAR | Status: AC
  Administered 2018-04-14: 10 mL via PERINEURAL

## 2018-04-14 MED ORDER — ROPIVACAINE HCL 2 MG/ML IJ SOLN
9.0000 mL | Freq: Once | INTRAMUSCULAR | Status: AC
  Administered 2018-04-14: 20 mL via PERINEURAL

## 2018-04-14 MED ORDER — LACTATED RINGERS IV SOLN
1000.0000 mL | Freq: Once | INTRAVENOUS | Status: AC
  Administered 2018-04-14: 1000 mL via INTRAVENOUS

## 2018-04-14 MED ORDER — FENTANYL CITRATE (PF) 100 MCG/2ML IJ SOLN
INTRAMUSCULAR | Status: AC
  Filled 2018-04-14: qty 2

## 2018-04-14 MED ORDER — LIDOCAINE HCL 2 % IJ SOLN
INTRAMUSCULAR | Status: AC
  Filled 2018-04-14: qty 20

## 2018-04-14 MED ORDER — ROPIVACAINE HCL 2 MG/ML IJ SOLN
INTRAMUSCULAR | Status: AC
Start: 2018-04-14 — End: ?
  Filled 2018-04-14: qty 10

## 2018-04-14 NOTE — Patient Instructions (Addendum)
____________________________________________________________________________________________  Post-Procedure Discharge Instructions  Instructions:  Apply ice: Fill a plastic sandwich bag with crushed ice. Cover it with a small towel and apply to injection site. Apply for 15 minutes then remove x 15 minutes. Repeat sequence on day of procedure, until you go to bed. The purpose is to minimize swelling and discomfort after procedure.  Apply heat: Apply heat to procedure site starting the day following the procedure. The purpose is to treat any soreness and discomfort from the procedure.  Food intake: Start with clear liquids (like water) and advance to regular food, as tolerated.   Physical activities: Keep activities to a minimum for the first 8 hours after the procedure.   Driving: If you have received any sedation, you are not allowed to drive for 24 hours after your procedure.  Blood thinner: Restart your blood thinner 6 hours after your procedure. (Only for those taking blood thinners)  Insulin: As soon as you can eat, you may resume your normal dosing schedule. (Only for those taking insulin)  Infection prevention: Keep procedure site clean and dry.  Post-procedure Pain Diary: Extremely important that this be done correctly and accurately. Recorded information will be used to determine the next step in treatment.  Pain evaluated is that of treated area only. Do not include pain from an untreated area.  Complete every hour, on the hour, for the initial 8 hours. Set an alarm to help you do this part accurately.  Do not go to sleep and have it completed later. It will not be accurate.  Follow-up appointment: Keep your follow-up appointment after the procedure. Usually 2 weeks for most procedures. (6 weeks in the case of radiofrequency.) Bring you pain diary.   Expect:  From numbing medicine (AKA: Local Anesthetics): Numbness or decrease in pain.  Onset: Full effect within 15  minutes of injected.  Duration: It will depend on the type of local anesthetic used. On the average, 1 to 8 hours.   From steroids: Decrease in swelling or inflammation. Once inflammation is improved, relief of the pain will follow.  Onset of benefits: Depends on the amount of swelling present. The more swelling, the longer it will take for the benefits to be seen. In some cases, up to 10 days.  Duration: Steroids will stay in the system x 2 weeks. Duration of benefits will depend on multiple posibilities including persistent irritating factors.  From procedure: Some discomfort is to be expected once the numbing medicine wears off. This should be minimal if ice and heat are applied as instructed.  Call if:  You experience numbness and weakness that gets worse with time, as opposed to wearing off.  New onset bowel or bladder incontinence. (This applies to Spinal procedures only)  Emergency Numbers:  Durning business hours (Monday - Thursday, 8:00 AM - 4:00 PM) (Friday, 9:00 AM - 12:00 Noon): [phone]  After hours: [phone] ____________________________________________________________________________________________   Pain Management Discharge Instructions  General Discharge Instructions :  If you need to reach your doctor call: Monday-Friday 8:00 am - 4:00 pm at [phone] or toll free [phone].  After clinic hours [phone] to have operator reach doctor.  Bring all of your medication bottles to all your appointments in the pain clinic.  To cancel or reschedule your appointment with Pain Management please remember to call 24 hours in advance to avoid a fee.  Refer to the educational materials which you have been given on: General Risks, I had my Procedure. Discharge Instructions, Post Sedation.    Post Procedure Instructions:  The drugs you were given will stay in your system until tomorrow, so for the next 24 hours you should not drive, make any legal  decisions or drink any alcoholic beverages.  You may eat anything you prefer, but it is better to start with liquids then soups and crackers, and gradually work up to solid foods.  Please notify your doctor immediately if you have any unusual bleeding, trouble breathing or pain that is not related to your normal pain.  Depending on the type of procedure that was done, some parts of your body may feel week and/or numb.  This usually clears up by tonight or the next day.  Walk with the use of an assistive device or accompanied by an adult for the 24 hours.  You may use ice on the affected area for the first 24 hours.  Put ice in a Ziploc bag and cover with a towel and place against area 15 minutes on 15 minutes off.  You may switch to heat after 24 hours.Facet Blocks Patient Information  Description: The facets are joints in the spine between the vertebrae.  Like any joints in the body, facets can become irritated and painful.  Arthritis can also effect the facets.  By injecting steroids and local anesthetic in and around these joints, we can temporarily block the nerve supply to them.  Steroids act directly on irritated nerves and tissues to reduce selling and inflammation which often leads to decreased pain.  Facet blocks may be done anywhere along the spine from the neck to the low back depending upon the location of your pain.   After numbing the skin with local anesthetic (like Novocaine), a small needle is passed onto the facet joints under x-ray guidance.  You may experience a sensation of pressure while this is being done.  The entire block usually lasts about 15-25 minutes.   Conditions which may be treated by facet blocks:   Low back/buttock pain  Neck/shoulder pain  Certain types of headaches  Preparation for the injection:  1. Do not eat any solid food or dairy products within 8 hours of your appointment. 2. You may drink clear liquid up to 3 hours before appointment.  Clear  liquids include water, black coffee, juice or soda.  No milk or cream please. 3. You may take your regular medication, including pain medications, with a sip of water before your appointment.  Diabetics should hold regular insulin (if taken separately) and take 1/2 normal NPH dose the morning of the procedure.  Carry some sugar containing items with you to your appointment. 4. A driver must accompany you and be prepared to drive you home after your procedure. 5. Bring all your current medications with you. 6. An IV may be inserted and sedation may be given at the discretion of the physician. 7. A blood pressure cuff, EKG and other monitors will often be applied during the procedure.  Some patients may need to have extra oxygen administered for a short period. 8. You will be asked to provide medical information, including your allergies and medications, prior to the procedure.  We must know immediately if you are taking blood thinners (like Coumadin/Warfarin) or if you are allergic to IV iodine contrast (dye).  We must know if you could possible be pregnant.  Possible side-effects:   Bleeding from needle site  Infection (rare, may require surgery)  Nerve injury (rare)  Numbness & tingling (temporary)  Difficulty urinating (rare, temporary)    Spinal headache (a headache worse with upright posture)  Light-headedness (temporary)  Pain at injection site (serveral days)  Decreased blood pressure (rare, temporary)  Weakness in arm/leg (temporary)  Pressure sensation in back/neck (temporary)   Call if you experience:   Fever/chills associated with headache or increased back/neck pain  Headache worsened by an upright position  New onset, weakness or numbness of an extremity below the injection site  Hives or difficulty breathing (go to the emergency room)  Inflammation or drainage at the injection site(s)  Severe back/neck pain greater than usual  New symptoms which are  concerning to you  Please note:  Although the local anesthetic injected can often make your back or neck feel good for several hours after the injection, the pain will likely return. It takes 3-7 days for steroids to work.  You may not notice any pain relief for at least one week.  If effective, we will often do a series of 2-3 injections spaced 3-6 weeks apart to maximally decrease your pain.  After the initial series, you may be a candidate for a more permanent nerve block of the facets.  If you have any questions, please call [phone] [facility] 

## 2018-04-14 NOTE — Progress Notes (Signed)
Safety precautions to be maintained throughout the outpatient stay will include: orient to surroundings, keep bed in low position, maintain call bell within reach at all times, provide assistance with transfer out of bed and ambulation.  

## 2018-04-15 NOTE — Telephone Encounter (Signed)
Spoke with patient and she reports that she is doing very well this morning, no questions or concerns.  Will see her at her next appt.

## 2018-04-20 ENCOUNTER — Other Ambulatory Visit: Payer: Self-pay | Admitting: Nurse Practitioner

## 2018-04-27 ENCOUNTER — Ambulatory Visit: Payer: Self-pay | Admitting: Urology

## 2018-05-11 ENCOUNTER — Ambulatory Visit: Payer: PPO | Admitting: Pain Medicine

## 2018-05-11 ENCOUNTER — Other Ambulatory Visit: Payer: Self-pay

## 2018-05-11 ENCOUNTER — Encounter: Payer: Self-pay | Admitting: Gastroenterology

## 2018-05-11 ENCOUNTER — Ambulatory Visit (INDEPENDENT_AMBULATORY_CARE_PROVIDER_SITE_OTHER): Payer: PPO | Admitting: Gastroenterology

## 2018-05-11 VITALS — BP 106/72 | HR 76 | Ht 61.0 in | Wt 140.4 lb

## 2018-05-11 DIAGNOSIS — K5903 Drug induced constipation: Secondary | ICD-10-CM

## 2018-05-11 DIAGNOSIS — T402X5A Adverse effect of other opioids, initial encounter: Secondary | ICD-10-CM

## 2018-05-11 DIAGNOSIS — F1190 Opioid use, unspecified, uncomplicated: Secondary | ICD-10-CM

## 2018-05-11 DIAGNOSIS — R1013 Epigastric pain: Secondary | ICD-10-CM | POA: Diagnosis not present

## 2018-05-11 MED ORDER — NA SULFATE-K SULFATE-MG SULF 17.5-3.13-1.6 GM/177ML PO SOLN: 1.0000 | Freq: Once | ORAL | 0 refills | Status: AC

## 2018-05-11 NOTE — Progress Notes (Signed)
[name], MD [address]  [place]  [place], [place] [address]  Main: [phone]  Fax: [phone]    Gastroenterology Consultation  Referring Provider:     Pleas Koch, NP Primary Care Physician:  Pleas Koch, NP Primary Gastroenterologist:  Dr. [name] Reason for Consultation:     Chronic constipation, postprandial nausea and vomiting        HPI:   Karen Edwards is a 52 y.o. Caucasian female referred by Dr. Carlis Abbott, Leticia Penna, NP  for consultation & management of chronic constipation postprandial nausea and vomiting. She has history of chronic neck pain and back pain for which she is on long-term opioid use, chronic tobacco use.  Chronic constipation: reports for several years, associated with significant bloating, hard and lumpy stools with significant straining. She had history of hemorrhoidectomy in 2016 at [facility]. She reports having had a colonoscopy prior to hemorrhoidectomy, polyps were detected, report not available. Patient tried over-the-counter stool softeners, laxatives, fiber supplements, MiraLAX which did not help. She was on linaclotide prescribed by her primary care doctor which provided significant relief. However, patient could not afford $45 co-pay monthly. Therefore, she stopped taking it. Currently she is having bowel movements once a week. She denies rectal bleeding.  Postprandial nausea and vomiting: she reports several month history of postprandial nausea and emesis, which occurs about 15 minutes after eating. This occurs particularly after consuming fatty foods, example hamburger that she had it for dinner last night. She denies epigastric pain other than bloating. She denies weight loss. She never had an EGD in the past. She denies heartburn, dysphagia.  NSAIDs: none  Antiplts/Anticoagulants/Anti thrombotics: none  GI Procedures: colonoscopy in 2016, report not available She denies family  history of GI malignancy  Past Medical History:  Diagnosis Date  . Anxiety   . Chronic back pain   . Degenerative joint disease   . Depression   . Hypotension   . IBS (irritable bowel syndrome) 05/2015  . MVP (mitral valve prolapse)     Past Surgical History:  Procedure Laterality Date  . ANKLE SURGERY Right 03/02/2014   repair of tendon and sural nerve right ankle  . CERVICAL SPINE SURGERY  02/21/2016   arthrodesis of anterior cervical spine (fusion of C5-6,C6-7 and insertion of bone allograft  . CESAREAN SECTION    . CESAREAN SECTION WITH BILATERAL TUBAL LIGATION  1990  . COLONOSCOPY  04/19/2015  . HEMORRHOID SURGERY N/A 06/16/2015   Procedure: HEMORRHOIDECTOMY;  Surgeon: Leonie Green, MD;  Location: [facility] ORS;  Service: General;  Laterality: N/A;  . HYSTEROSCOPY W/D&C  12/24/2013    Current Outpatient Medications:  .  atorvastatin (LIPITOR) 10 MG tablet, Take 1 tablet (10 mg total) by mouth daily., Disp: 90 tablet, Rfl: 3 .  Cyanocobalamin (B-12 PO), Take 2 tablets by mouth every other day. , Disp: , Rfl:  .  FLUoxetine (PROZAC) 20 MG capsule, Take 3 capsules (60 mg total) by mouth daily., Disp: 270 capsule, Rfl: 3 .  Green Tea, Camillia sinensis, (GREEN TEA PO), Take 2 capsules by mouth daily. , Disp: , Rfl:  .  HYDROcodone-acetaminophen (NORCO/VICODIN) 5-325 MG tablet, Take 1 tablet by mouth 2 (two) times daily., Disp: 60 tablet, Rfl: 0 .  methocarbamol (ROBAXIN) 500 MG tablet, Take 1 tablet (500 mg total) by mouth every 8 (eight) hours as needed for muscle spasms., Disp: 90 tablet, Rfl: 1 .  Potassium 99 MG TABS, Take 1  tablet by mouth daily., Disp: , Rfl:  .  traZODone (DESYREL) 100 MG tablet, Take 3 tablets (300 mg total) by mouth at bedtime., Disp: 270 tablet, Rfl: 3 .  Na Sulfate-K Sulfate-Mg Sulf 17.5-3.13-1.6 GM/177ML SOLN, Take 1 kit by mouth once for 1 dose., Disp: 354 mL, Rfl: 0   Family History  Problem Relation Age of Onset  . Arthritis Mother   .  Depression Mother   . Hyperlipidemia Mother   . Hypertension Mother   . Kidney disease Mother   . Vision loss Mother   . Hypothyroidism Mother   . Hearing loss Father   . Hyperlipidemia Father   . Hypertension Father   . Diabetes Mellitus I Maternal Grandmother   . Stroke Maternal Grandmother   . Congestive Heart Failure Maternal Grandfather   . Heart attack Maternal Grandfather   . Prostate cancer Paternal Grandfather   . Thyroid disease Maternal Aunt   . Diabetes Maternal Aunt   . Gout Maternal Aunt      Social History   Tobacco Use  . Smoking status: Current Every Day Smoker    Packs/day: 0.10    Years: 37.00    Pack years: 3.70    Types: Cigarettes  . Smokeless tobacco: Never Used  Substance Use Topics  . Alcohol use: Yes    Alcohol/week: 0.6 oz    Types: 1 Cans of beer per week    Comment: drinks an occasional beer  . Drug use: No    Allergies as of 05/11/2018 - Review Complete 05/11/2018  Allergen Reaction Noted  . No known allergies  02/07/2015    Review of Systems:    All systems reviewed and negative except where noted in HPI.   Physical Exam:  BP 106/72   Pulse 76   Ht _0  (1.549 m)   Wt 140 lb 6.4 oz (63.7 kg)   LMP 05/05/2017 (Exact Date)   BMI 26.53 kg/m  Patient's last menstrual period was 05/05/2017 (exact date).  General:   Alert,  Well-developed, well-nourished, pleasant and cooperative in NAD Head:  Normocephalic and atraumatic. Eyes:  Sclera clear, no icterus.   Conjunctiva pink. Ears:  Normal auditory acuity. Nose:  No deformity, discharge, or lesions. Mouth:  No deformity or lesions,oropharynx pink & moist. Neck:  Supple; no masses or thyromegaly. Lungs:  Respirations even and unlabored.  Clear throughout to auscultation.   No wheezes, crackles, or rhonchi. No acute distress. Heart:  Regular rate and rhythm; no murmurs, clicks, rubs, or gallops. Abdomen:  Normal bowel sounds. Soft, non-tender and distended, tympanic, without  masses, hepatosplenomegaly or hernias noted.  No guarding or rebound tenderness.   Rectal: Not performed Msk:  Symmetrical without gross deformities. Good, equal movement & strength bilaterally. Pulses:  Normal pulses noted. Extremities:  No clubbing or edema.  No cyanosis. Neurologic:  Alert and oriented x3;  grossly normal neurologically. Skin:  Intact without significant lesions or rashes. No jaundice. Lymph Nodes:  No significant cervical adenopathy. Psych:  Alert and cooperative. Normal mood and affect.  Imaging Studies: No abdominal imaging  Assessment and Plan:   Karen Edwards is a 52 y.o. Caucasian female with chronic opioid use for musculoskeletal pain, chronic tobacco use, chronic constipation, prior hemorrhoidectomy, several month history of postprandial nausea and vomiting.  Chronic constipation:Opioid-induced Linaclotide worked for her in the past We will try to get linaclotide through specialty pharmacy I have provided with samples, linaclotide to 90 MCG daily Discussed with her about high-fiber diet,  fiber supplements and water intake  Symptomatic hemorrhoids: Discussed with her about outpatient hemorrhoid ligation  History of colon polyps: Last colonoscopy in 2016, report not available Recommend repeat colonoscopy and patient agreeable  Postprandial nausea and vomiting: Differentials include gastroparesis secondary to long-term opioid use or peptic ulcer disease or H. Pylori gastritis Discussed with her about empiric trial of gastroparesis diet EGD for further evaluation  Follow up in 2 months   [name], MD

## 2018-05-11 NOTE — Patient Instructions (Signed)
Gastroparesis Gastroparesis, also called delayed gastric emptying, is a condition in which food takes longer than normal to empty from the stomach. The condition is usually long-lasting (chronic). What are the causes? This condition may be caused by:  An endocrine disorder, such as hypothyroidism or diabetes. Diabetes is the most common cause of this condition.  A nervous system disease, such as Parkinson disease or multiple sclerosis.  Cancer, infection, or surgery of the stomach or vagus nerve.  A connective tissue disorder, such as scleroderma.  Certain medicines.  In most cases, the cause is not known. What increases the risk? This condition is more likely to develop in:  People with certain disorders, including endocrine disorders, eating disorders, amyloidosis, and scleroderma.  People with certain diseases, including Parkinson disease or multiple sclerosis.  People with cancer or infection of the stomach or vagus nerve.  People who have had surgery on the stomach or vagus nerve.  People who take certain medicines.  Women.  What are the signs or symptoms? Symptoms of this condition include:  An early feeling of fullness when eating.  Nausea.  Weight loss.  Vomiting.  Heartburn.  Abdominal bloating.  Inconsistent blood glucose levels.  Lack of appetite.  Acid from the stomach coming up into the esophagus (gastroesophageal reflux).  Spasms of the stomach.  Symptoms may come and go. How is this diagnosed? This condition is diagnosed with tests, such as:  Tests that check how long it takes food to move through the stomach and intestines. These tests include: ? Upper gastrointestinal (GI) series. In this test, X-rays of the intestines are taken after you drink a liquid. The liquid makes the intestines show up better on the X-rays. ? Gastric emptying scintigraphy. In this test, scans are taken after you eat food that contains a small amount of radioactive  material. ? Wireless capsule GI monitoring system. This test involves swallowing a capsule that records information about movement through the stomach.  Gastric manometry. This test measures electrical and muscular activity in the stomach. It is done with a thin tube that is passed down the throat and into the stomach.  Endoscopy. This test checks for abnormalities in the lining of the stomach. It is done with a long, thin tube that is passed down the throat and into the stomach.  An ultrasound. This test can help rule out gallbladder disease or pancreatitis as a cause of your symptoms. It uses sound waves to take pictures of the inside of your body.  How is this treated? There is no cure for gastroparesis. This condition may be managed with:  Treatment of the underlying condition causing the gastroparesis.  Lifestyle changes, including exercise and dietary changes. Dietary changes can include: ? Changes in what and when you eat. ? Eating smaller meals more often. ? Eating low-fat foods. ? Eating low-fiber forms of high-fiber foods, such as cooked vegetables instead of raw vegetables. ? Having liquid foods in place of solid foods. Liquid foods are easier to digest.  Medicines. These may be given to control nausea and vomiting and to stimulate stomach muscles.  Getting food through a feeding tube. This may be done in severe cases.  A gastric neurostimulator. This is a device that is inserted into the body with surgery. It helps improve stomach emptying and control nausea and vomiting.  Follow these instructions at home:  Follow your health care provider's instructions about exercise and diet.  Take medicines only as directed by your health care provider. Contact a  health care provider if:  Your symptoms do not improve with treatment.  You have new symptoms. Get help right away if:  You have severe abdominal pain that does not improve with treatment.  You have nausea that does  not go away.  You cannot keep fluids down. This information is not intended to replace advice given to you by your health care provider. Make sure you discuss any questions you have with your health care provider. Document Released: 10/14/2005 Document Revised: 03/21/2016 Document Reviewed: 10/10/2014 Elsevier Interactive Patient Education  2018 [place].  High-Fiber Diet Fiber, also called dietary fiber, is a type of carbohydrate found in fruits, vegetables, whole grains, and beans. A high-fiber diet can have many health benefits. Your health care provider may recommend a high-fiber diet to help:  Prevent constipation. Fiber can make your bowel movements more regular.  Lower your cholesterol.  Relieve hemorrhoids, uncomplicated diverticulosis, or irritable bowel syndrome.  Prevent overeating as part of a weight-loss plan.  Prevent heart disease, type 2 diabetes, and certain cancers.  What is my plan? The recommended daily intake of fiber includes:  38 grams for men under age 50.  39 grams for men over age 30.  43 grams for women under age 27.  18 grams for women over age 34.  You can get the recommended daily intake of dietary fiber by eating a variety of fruits, vegetables, grains, and beans. Your health care provider may also recommend a fiber supplement if it is not possible to get enough fiber through your diet. What do I need to know about a high-fiber diet?  Fiber supplements have not been widely studied for their effectiveness, so it is better to get fiber through food sources.  Always check the fiber content on thenutrition facts label of any prepackaged food. Look for foods that contain at least 5 grams of fiber per serving.  Ask your dietitian if you have questions about specific foods that are related to your condition, especially if those foods are not listed in the following section.  Increase your daily fiber consumption gradually. Increasing your intake  of dietary fiber too quickly may cause bloating, cramping, or gas.  Drink plenty of water. Water helps you to digest fiber. What foods can I eat? Grains Whole-grain breads. Multigrain cereal. Oats and oatmeal. Brown rice. Barley. Bulgur wheat. [place]. Bran muffins. Popcorn. Rye wafer crackers. Vegetables Sweet potatoes. Spinach. Kale. Artichokes. Cabbage. Broccoli. Green peas. Carrots. Squash. Fruits Berries. Pears. Apples. Oranges. Avocados. Prunes and raisins. Dried figs. Meats and Other Protein Sources [place], kidney, pinto, and soy beans. Split peas. Lentils. Nuts and seeds. Dairy Fiber-fortified yogurt. Beverages Fiber-fortified soy milk. Fiber-fortified orange juice. Other Fiber bars. The items listed above may not be a complete list of recommended foods or beverages. Contact your dietitian for more options. What foods are not recommended? Grains White bread. Pasta made with refined flour. White rice. Vegetables Fried potatoes. Canned vegetables. Well-cooked vegetables. Fruits Fruit juice. Cooked, strained fruit. Meats and Other Protein Sources Fatty cuts of meat. Fried Sales executive or fried fish. Dairy Milk. Yogurt. Cream cheese. Sour cream. Beverages Soft drinks. Other Cakes and pastries. Butter and oils. The items listed above may not be a complete list of foods and beverages to avoid. Contact your dietitian for more information. What are some tips for including high-fiber foods in my diet?  Eat a wide variety of high-fiber foods.  Make sure that half of all grains consumed each day are whole grains.  Replace  breads and cereals made from refined flour or white flour with whole-grain breads and cereals.  Replace white rice with brown rice, bulgur wheat, or millet.  Start the day with a breakfast that is high in fiber, such as a cereal that contains at least 5 grams of fiber per serving.  Use beans in place of meat in soups, salads, or pasta.  Eat high-fiber snacks,  such as berries, raw vegetables, nuts, or popcorn. This information is not intended to replace advice given to you by your health care provider. Make sure you discuss any questions you have with your health care provider. Document Released: 10/14/2005 Document Revised: 03/21/2016 Document Reviewed: 03/29/2014 Elsevier Interactive Patient Education  [employer].

## 2018-05-12 ENCOUNTER — Ambulatory Visit: Payer: PPO | Admitting: Primary Care

## 2018-05-12 ENCOUNTER — Other Ambulatory Visit: Payer: Self-pay

## 2018-05-12 DIAGNOSIS — K581 Irritable bowel syndrome with constipation: Secondary | ICD-10-CM

## 2018-05-12 DIAGNOSIS — K5909 Other constipation: Secondary | ICD-10-CM

## 2018-05-12 DIAGNOSIS — Z86010 Personal history of colonic polyps: Secondary | ICD-10-CM

## 2018-05-12 DIAGNOSIS — R112 Nausea with vomiting, unspecified: Secondary | ICD-10-CM

## 2018-05-12 MED ORDER — LINACLOTIDE 290 MCG PO CAPS: 290.0000 ug | ORAL_CAPSULE | Freq: Every day | ORAL | 3 refills | Status: DC

## 2018-05-14 ENCOUNTER — Ambulatory Visit (HOSPITAL_BASED_OUTPATIENT_CLINIC_OR_DEPARTMENT_OTHER): Payer: PPO | Admitting: Pain Medicine

## 2018-05-14 ENCOUNTER — Ambulatory Visit
Admission: RE | Admit: 2018-05-14 | Discharge: 2018-05-14 | Disposition: A | Discharge status: Home / Self Care | Payer: PPO | Source: Ambulatory Visit | Attending: Pain Medicine | Admitting: Pain Medicine

## 2018-05-14 ENCOUNTER — Encounter: Payer: Self-pay | Admitting: Pain Medicine

## 2018-05-14 ENCOUNTER — Other Ambulatory Visit: Payer: Self-pay

## 2018-05-14 VITALS — BP 107/59 | HR 72 | Temp 97.8°F | Resp 12 | Ht 61.0 in | Wt 140.0 lb

## 2018-05-14 DIAGNOSIS — G894 Chronic pain syndrome: Secondary | ICD-10-CM | POA: Insufficient documentation

## 2018-05-14 DIAGNOSIS — M5116 Intervertebral disc disorders with radiculopathy, lumbar region: Secondary | ICD-10-CM | POA: Diagnosis not present

## 2018-05-14 DIAGNOSIS — M79662 Pain in left lower leg: Secondary | ICD-10-CM | POA: Diagnosis not present

## 2018-05-14 DIAGNOSIS — G8929 Other chronic pain: Secondary | ICD-10-CM

## 2018-05-14 DIAGNOSIS — M5442 Lumbago with sciatica, left side: Secondary | ICD-10-CM

## 2018-05-14 DIAGNOSIS — M5137 Other intervertebral disc degeneration, lumbosacral region: Secondary | ICD-10-CM | POA: Diagnosis not present

## 2018-05-14 DIAGNOSIS — M5416 Radiculopathy, lumbar region: Secondary | ICD-10-CM

## 2018-05-14 DIAGNOSIS — M5441 Lumbago with sciatica, right side: Secondary | ICD-10-CM | POA: Diagnosis not present

## 2018-05-14 DIAGNOSIS — M79661 Pain in right lower leg: Secondary | ICD-10-CM | POA: Diagnosis not present

## 2018-05-14 DIAGNOSIS — M47816 Spondylosis without myelopathy or radiculopathy, lumbar region: Secondary | ICD-10-CM

## 2018-05-14 DIAGNOSIS — M79605 Pain in left leg: Secondary | ICD-10-CM

## 2018-05-14 DIAGNOSIS — M79604 Pain in right leg: Secondary | ICD-10-CM

## 2018-05-14 DIAGNOSIS — M5126 Other intervertebral disc displacement, lumbar region: Secondary | ICD-10-CM

## 2018-05-14 MED ORDER — LIDOCAINE HCL 2 % IJ SOLN
20.0000 mL | Freq: Once | INTRAMUSCULAR | Status: AC
  Administered 2018-05-14: 400 mg
  Filled 2018-05-14: qty 40

## 2018-05-14 MED ORDER — CEFAZOLIN SODIUM-DEXTROSE 1-4 GM/50ML-% IV SOLN
1.0000 g | Freq: Once | INTRAVENOUS | Status: AC
  Administered 2018-05-14: 1 g via INTRAVENOUS

## 2018-05-14 MED ORDER — LACTATED RINGERS IV SOLN
1000.0000 mL | Freq: Once | INTRAVENOUS | Status: AC
  Administered 2018-05-14: 1000 mL via INTRAVENOUS

## 2018-05-14 MED ORDER — HYDROCODONE-ACETAMINOPHEN 5-325 MG PO TABS: 1.0000 | ORAL_TABLET | Freq: Two times a day (BID) | ORAL | 0 refills | Status: DC

## 2018-05-14 MED ORDER — MIDAZOLAM HCL 5 MG/5ML IJ SOLN
1.0000 mg | INTRAMUSCULAR | Status: DC | PRN
  Administered 2018-05-14: 2 mg via INTRAVENOUS
  Filled 2018-05-14: qty 5

## 2018-05-14 MED ORDER — CEFAZOLIN SODIUM 1 G IJ SOLR
INTRAMUSCULAR | Status: AC
  Filled 2018-05-14: qty 10

## 2018-05-14 MED ORDER — FENTANYL CITRATE (PF) 100 MCG/2ML IJ SOLN
25.0000 ug | INTRAMUSCULAR | Status: DC | PRN
  Administered 2018-05-14: 50 ug via INTRAVENOUS
  Filled 2018-05-14: qty 2

## 2018-05-14 MED ORDER — ROPIVACAINE HCL 2 MG/ML IJ SOLN
INTRAMUSCULAR | Status: AC
  Filled 2018-05-14: qty 10

## 2018-05-14 MED ORDER — SODIUM CHLORIDE 0.9 % IJ SOLN
INTRAMUSCULAR | Status: AC
  Filled 2018-05-14: qty 10

## 2018-05-14 NOTE — Progress Notes (Signed)
Patient's Name: Karen Edwards  MRN: [id number]  Referring Provider: Pleas Koch, NP  DOB: 1966-10-24  PCP: Pleas Koch, NP  DOS: 05/14/2018  Note by: Gaspar Cola, MD  Service setting: Ambulatory outpatient  Specialty: Interventional Pain Management  Patient type: Established  Location: [facility] (AMB) Pain Management Facility  Visit type: Interventional Procedure   Primary Reason for Admission: Surgical management of chronic pain condition.  Procedure:  Anesthesia, Analgesia, Anxiolysis:  Type: Diagnostic, Inter-Laminar, Epidural Spinal Cord Trial Implant Region: Lumbar Level: T11-12 Laterality: Bilateral Paramedial  Type: Moderate (Conscious) Sedation combined with Local Anesthesia Indication(s): Analgesia and Anxiety Route: Intravenous (IV) IV Access: Secured Sedation: Meaningful verbal contact was maintained at all times during the procedure  Local Anesthetic: Lidocaine 1-2%   Indications: 1. Chronic low back pain (Primary Area of Pain) (Bilateral) (R>L)   2. Chronic lower extremity pain (Secondary Area of Pain) (Bilateral) (R>L)   3. Chronic lumbar radicular pain   4. DDD (degenerative disc disease), lumbosacral   5. L1-2 disc extrusion (Right)   6. Lumbar radicular pain (Right) (L4)   7. Lumbar spondylosis   8. Chronic pain syndrome    Pain Score: Pre-procedure: 4 /10 Post-procedure: 2 /10  Pre-op Assessment:  [name] is a 52 y.o. (year old), female patient, seen today for interventional treatment. She  has a past surgical history that includes Cesarean section; Ankle surgery (Right, 03/02/2014); Hemorrhoid surgery (N/A, 06/16/2015); Cervical spine surgery (02/21/2016); Cesarean section with bilateral tubal ligation (1990); Colonoscopy (04/19/2015); and Hysteroscopy w/D&C (12/24/2013).  Initial Vital Signs:  Pulse/EKG Rate: 72ECG Heart Rate: 62 Temp: 98.4 F (36.9 C) Resp: 16 BP: 102/74 SpO2: 95 %  BMI: Estimated body mass index is 26.45 kg/m as  calculated from the following:   Height as of this encounter: 5\' 1"  (1.549 m).   Weight as of this encounter: 140 lb (63.5 kg).  Risk Assessment: Allergies: Reviewed. She is allergic to no known allergies.  Allergy Precautions: None required Coagulopathies: Reviewed. None identified.  Blood-thinner therapy: None at this time Active Infection(s): Reviewed. None identified. [name] is afebrile  Site Confirmation: [name] was asked to confirm the procedure and laterality before marking the site, which she did. Procedure checklist: Completed Consent: Before the procedure and under the influence of no sedative(s), amnesic(s), or anxiolytics, the patient was informed of the treatment options, risks and possible complications. To fulfill our ethical and legal obligations, as recommended by the American Medical Association's Code of Ethics, I have informed the patient of my clinical impression; the nature and purpose of the treatment or procedure; the risks, benefits, and possible complications of the intervention; the alternatives, including doing nothing; the risk(s) and benefit(s) of the alternative treatment(s) or procedure(s); and the risk(s) and benefit(s) of doing nothing.  [name] was provided with information about the general risks and possible complications associated with most interventional procedures. These include, but are not limited to: failure to achieve desired goals, infection, bleeding, organ or nerve damage, allergic reactions, paralysis, and/or death.  In addition, she was informed of those risks and possible complications associated to this particular procedure, which include, but are not limited to: damage to the implant; failure to decrease pain; local, systemic, or serious CNS infections, intraspinal abscess with possible cord compression and paralysis, or life-threatening such as meningitis; intrathecal and/or epidural bleeding with formation of hematoma with  possible spinal cord compression and permanent paralysis; organ damage; nerve injury or damage with subsequent sensory, motor, and/or autonomic system  dysfunction, resulting in transient or permanent pain, numbness, and/or weakness of one or several areas of the body; allergic reactions, either minor or major life-threatening, such as anaphylactic or anaphylactoid reactions.  Furthermore, [name] was informed of those risks and complications associated with the medications. These include, but are not limited to: allergic reactions (i.e.: anaphylactic or anaphylactoid reactions); arrhythmia;  Hypotension/hypertension; cardiovascular collapse; respiratory depression and/or shortness of breath; swelling or edema; medication-induced neural toxicity; particulate matter embolism and blood vessel occlusion with resultant organ, and/or nervous system infarction and permanent paralysis.  Finally, she was informed that Medicine is not an exact science; therefore, there is also the possibility of unforeseen or unpredictable risks and/or possible complications that may result in a catastrophic outcome. The patient indicated having understood very clearly. We have given the patient no guarantees and we have made no promises. Enough time was given to the patient to ask questions, all of which were answered to the patient's satisfaction. [name] has indicated that she wanted to continue with the procedure. Attestation: I, the ordering provider, attest that I have discussed with the patient the benefits, risks, side-effects, alternatives, likelihood of achieving goals, and potential problems during recovery for the procedure that I have provided informed consent. Date  Time: 05/14/2018  7:53 AM  Pre-Procedure Preparation:  Monitoring: As per clinic protocol. Respiration, ETCO2, SpO2, BP, heart rate and rhythm monitor placed and checked for adequate function Safety Precautions: Patient was assessed for positional  comfort and pressure points before starting the procedure. Time-out: I initiated and conducted the "Time-out" before starting the procedure, as per protocol. The patient was asked to participate by confirming the accuracy of the "Time Out" information. Verification of the correct person, site, and procedure were performed and confirmed by me, the nursing staff, and the patient. "Time-out" conducted as per Joint Commission's Universal Protocol (UP.01.01.01). Time: 96  Description of Procedure Process:   Position: Prone Target Area: Posterior epidural space Approach: Posterior percutaneous, paramedial, interlaminar approach Area Prepped: Bilateral thoraco-lumbar Region Prepping solution: ChloraPrep (2% chlorhexidine gluconate and 70% isopropyl alcohol) Safety Precautions: Safe injection practices and needle disposal techniques used. Medications properly checked for expiration dates. SDV (single dose vial) medications used. Aspiration looking for blood return and/or CSF was conducted prior to all injections. At no point did I inject any substances, as a needle was being advanced. No attempts were made at seeking any paresthesias.  Description of the Procedure: Availability of a responsible, adult driver, and NPO status confirmed. Informed consent was obtained after having discussed risks and possible complications. An IV was started. The patient was then taken to the fluoroscopy suite, where the patient was placed in position for the procedure, over the fluoroscopy table. The patient was then monitored in the usual manner. Fluoroscopy was manipulated to obtain the best possible view of the target. Parallex error was corrected before commencing the procedure. Once a clear view of the target had been obtained, the skin and deeper tissues over the procedure site were infiltrated using lidocaine, loaded in a 10 cc luer-loc syringe with a 0.5 inch, 25-G needle. The introducer needle(s) was/were then inserted  through the skin and deeper tissues. A paramidline approach was used to enter the posterior epidural space at a 30 angle, using "Loss-of-resistance Technique" with 3 ml of PF-NaCl (0.9% NSS). Correct needle placement was confirmed in the antero-posterior and lateral fluoroscopic views. The lead was gently introduced and manipulated under real-time fluoroscopy, constantly assessing for pain, discomfort, or paresthesias, until the  tip rested at the desired level. Both sides were done in identical fashion Electrode placement was tested until appropriate coverage was attained. Once the patient confirmed that the stimulation was over the desired area, the lead(s) was/were secured in place and the introducer needles removed. This was done under real-time fluoroscopy while observing the electrode tip to avoid unintended migration. The area was covered with a non-occlusive dressing and the patient transported to recovery for further programming.  Vitals:   05/14/18 1000 05/14/18 1010 05/14/18 1020 05/14/18 1030  BP: (!) 72/61 97/62 113/85 (!) 107/59  Pulse:      Resp: 12 13 13 12   Temp: 97.8 F (36.6 C)     TempSrc: Temporal     SpO2: 100% 100% 99% 99%  Weight:      Height:       Start Time: 0848 hrs. End Time: 0945 hrs.  Neurostimulator Details:   Lead(s):  Brand: Medtronic Epidural Access Level:  T11-12 T11-12  Lead implant:  Bilateral   No. of Electrodes/Lead:  8 8  Laterality:  Left Right  Top electrode location:  T8 T8  Bottom electrode location:  T10 T10  MRI compatibility:  Yes Yes  Model No.: [id number] Same  Length: 60 cm Same  Lot No.: [id number]   External Neurostimulator    Model No.: [id number]   Serial No.: [id number]    Imaging Guidance (Spinal):          Type of Imaging Technique: Fluoroscopy Guidance (Spinal) Indication(s): Assistance in needle guidance and placement for procedures requiring needle placement in or near specific anatomical locations not easily  accessible without such assistance. Exposure Time: Please see nurses notes. Contrast: None used. Fluoroscopic Guidance: I was personally present during the use of fluoroscopy. "Tunnel Vision Technique" used to obtain the best possible view of the target area. Parallax error corrected before commencing the procedure. "Direction-depth-direction" technique used to introduce the needle under continuous pulsed fluoroscopy. Once target was reached, antero-posterior, oblique, and lateral fluoroscopic projection used confirm needle placement in all planes. Images permanently stored in EMR.           Interpretation: No contrast injected. I personally interpreted the imaging intraoperatively. Adequate needle placement confirmed in multiple planes. Permanent images saved into the patient's record.  Antibiotic Prophylaxis:   Anti-infectives (From admission, onward)   Start     Dose/Rate Route Frequency Ordered Stop   05/14/18 0830  ceFAZolin (ANCEF) IVPB 1 g/50 mL premix     1 g 100 mL/hr over 30 Minutes Intravenous  Once 05/14/18 0820 05/14/18 0905     Indication(s): None identified  Post-operative Assessment:  Post-procedure Vital Signs:  Pulse/HCG Rate: 72(!) 59 Temp: 97.8 F (36.6 C) Resp: 12 BP: (!) 107/59 SpO2: 99 %  Complications: No immediate post-treatment complications observed by team, or reported by patient.  Note: The patient tolerated the entire procedure well. A repeat set of vitals were taken after the procedure and the patient was kept under observation following institutional policy, for this type of procedure. Post-procedural neurological assessment was performed, showing return to baseline, prior to discharge. The patient was provided with post-procedure discharge instructions, including a section on how to identify potential problems. Should any problems arise concerning this procedure, the patient was given instructions to immediately contact us, at any time, without  hesitation. In any case, we plan to contact the patient by telephone for a follow-up status report regarding this interventional procedure.  Comments:  No additional relevant information. Plan of  Care   Imaging Orders     DG C-Arm 1-60 Min-No Report Procedure Orders    No procedure(s) ordered today    Medications ordered for procedure: Meds ordered this encounter  Medications  . ceFAZolin (ANCEF) IVPB 1 g/50 mL premix    Order Specific Question:   Antibiotic Indication:    Answer:   Surgical Prophylaxis    Order Specific Question:   Other Indication:    Answer:   Procedure Prophylaxis  . lidocaine (XYLOCAINE) 2 % (with pres) injection 400 mg  . midazolam (VERSED) 5 MG/5ML injection 1-2 mg    Make sure Flumazenil is available in the pyxis when using this medication. If oversedation occurs, administer 0.2 mg IV over 15 sec. If after 45 sec no response, administer 0.2 mg again over 1 min; may repeat at 1 min intervals; not to exceed 4 doses (1 mg)  . fentaNYL (SUBLIMAZE) injection 25-50 mcg    Make sure Narcan is available in the pyxis when using this medication. In the event of respiratory depression (RR< 8/min): Titrate NARCAN (naloxone) in increments of 0.1 to 0.2 mg IV at 2-3 minute intervals, until desired degree of reversal.  . lactated ringers infusion 1,000 mL  . HYDROcodone-acetaminophen (NORCO/VICODIN) 5-325 MG tablet    Sig: Take 1 tablet by mouth 2 (two) times daily.    Dispense:  60 tablet    Refill:  0    Do not place this medication, or any other prescription from our practice, on "Automatic Refill". Patient may have prescription filled one day early if pharmacy is closed on scheduled refill date. Do not fill until: 05/14/2018 To last until: 06/13/18   Medications administered: We administered ceFAZolin, lidocaine, midazolam, fentaNYL, and lactated ringers.  See the medical record for exact dosing, route, and time of administration.  New Prescriptions   No  medications on file   Disposition: Discharge home  Discharge Date & Time: 05/14/2018; 1040 hrs.   Physician-requested Follow-up: Return in about 1 week (around 05/21/2018) for post-implant eval, w/ Dr. Dossie Arbour.  Future Appointments  Date Time Provider [place]  05/20/2018 11:00 AM Milinda Pointer, MD [facility] None  07/13/2018  2:15 PM Marius Ditch, Tally Due, MD [facility] None   Primary Care Physician: Pleas Koch, NP Location: [facility] Note by: Gaspar Cola, MD Date: 05/14/2018; Time: 8:17 PM  Disclaimer:  Medicine is not an Chief Strategy Officer. The only guarantee in medicine is that nothing is guaranteed. It is important to note that the decision to proceed with this intervention was based on the information collected from the patient. The Data and conclusions were drawn from the patient's questionnaire, the interview, and the physical examination. Because the information was provided in large part by the patient, it cannot be guaranteed that it has not been purposely or unconsciously manipulated. Every effort has been made to obtain as much relevant data as possible for this evaluation. It is important to note that the conclusions that lead to this procedure are derived in large part from the available data. Always take into account that the treatment will also be dependent on availability of resources and existing treatment guidelines, considered by other Pain Management Practitioners as being common knowledge and practice, at the time of the intervention. For Medico-Legal purposes, it is also important to point out that variation in procedural techniques and pharmacological choices are the acceptable norm. The indications, contraindications, technique, and results of the above procedure should only be interpreted and judged  by a Board-Certified Interventional Pain Specialist with extensive familiarity and expertise in the same exact procedure and  technique.

## 2018-05-14 NOTE — Progress Notes (Signed)
Safety precautions to be maintained throughout the outpatient stay will include: orient to surroundings, keep bed in low position, maintain call bell within reach at all times, provide assistance with transfer out of bed and ambulation.  

## 2018-05-14 NOTE — Progress Notes (Signed)
Nursing Pain Medication Assessment:  Safety precautions to be maintained throughout the outpatient stay will include: orient to surroundings, keep bed in low position, maintain call bell within reach at all times, provide assistance with transfer out of bed and ambulation.  Medication Inspection Compliance: Pill count conducted under aseptic conditions, in front of the patient. Neither the pills nor the bottle was removed from the patient's sight at any time. Once count was completed pills were immediately returned to the patient in their original bottle.  Medication: Hydrocodone/APAP Pill/Patch Count: 4 of 60 pills remain Pill/Patch Appearance: Markings consistent with prescribed medication Bottle Appearance: Standard pharmacy container. Clearly labeled. Filled Date: 06 / 24 / 2019 Last Medication intake:  Yesterday

## 2018-05-14 NOTE — Patient Instructions (Addendum)
___________________________________________________________________________________________  PATIENT DISCHARGE INSTRUCTIONS FOLLOWING IMPLANTATION OF SPINAL CORD STIMULATOR GENERATOR  You will be discharged from the hospital on the same day as your surgery, after you are fully recovered. Make arrangements to be driven home. DO NOT DRIVE YOURSELF!!   The bandage over the side incision may drain a small amount of blood. This is normal; don?t be alarmed.  Be certain to review all warnings/precautions in the patient brochure accompanying your stimulator.  Please call your referring physician to make an appointment for follow?up care.  Instructions:  Food intake: Start with clear liquids (like water) and advance to regular food, as tolerated.   Physical activities:   Minimize bending, twisting, or awkward positions of the mid? and lower?back.   This is critical to prevent migration of leads, which will change stimulation patterns completely and require further surgery to correct.  Avoid lifting more than: 10 Lbs. for 6 weeks.  Wear the abdominal binder at all times for the next 6 weeks, except when bathing.  After 6 weeks you can gradually resume activities, but continue to avoid strenuous and awkward positions of the back.  Driving: You can start to drive again when you are fully recovered from the anesthesia side?effects (1?2 days).  Blood thinner: Restart your blood thinner 6 hours after your procedure. (Only for those taking blood thinners)  Insulin: As soon as you can eat, you may resume your normal dosing schedule. (Only for those taking insulin)  Wound dressing care: Keep procedure site clean and dry. Clean wound with alcohol, twice a day for the first 7 days, starting 48 hours after the surgery.   Keep incisions clean and dry. Cover with plastic wrap and tape when showering. Do not submerge incisions in a bathtub, pool or spa.  Follow-up appointment: Keep your follow-up  appointment after the procedure. Usually 6-7 days to remove the staples.  Expect:  From numbing medicine (AKA: Local Anesthetics): Numbness or decrease in pain.  Duration: It will depend on the type of local anesthetic used. On the average, 1 to 8 hours.   From procedure: Some discomfort is to be expected once the numbing medicine wears off. This should be minimal. Use your regular pain medicines.  Call if:  You experience numbness and weakness that gets worse with time, as opposed to wearing off.  New onset bowel or bladder incontinence. (Spinal procedures only)  have a temperature over 101.5?  are unable to urinate  have increasing difficulty walking or using your legs  have incisional pain that continues to increase rather than stabilize or improve  have any drainage of pus from your incisions  have drainage which completely saturates the bandage  Emergency Numbers:  [place] business hours (Monday - Thursday, 8:00 AM - 4:00 PM) (Friday, 9:00 AM - 12:00 Noon): [phone]  After hours: [phone] ____________________________________________________________________________________________  Instructions per [facility] given by Representative [employer]

## 2018-05-15 ENCOUNTER — Telehealth: Payer: Self-pay | Admitting: *Deleted

## 2018-05-15 NOTE — Telephone Encounter (Signed)
I have called patient and inform her that Allie Bossier can sign her handicap parking placard. Inform to give [place] time, forms are completed within 72 hours.  Patient verbalized understanding. She will drop of the form for Allie Bossier. Also she wanted Anda Kraft to know that she is requesting this due difficult to walk. She had recently went through procedure for spine.

## 2018-05-15 NOTE — Telephone Encounter (Signed)
Place form/paperwork in Kate Clark's inbox for review and complete if necessary.  

## 2018-05-15 NOTE — Telephone Encounter (Signed)
No problems post procedure. 

## 2018-05-15 NOTE — Telephone Encounter (Signed)
Form was dropped off. Placed in [place] tower.

## 2018-05-15 NOTE — Telephone Encounter (Signed)
Copied from [place] [id number]. Topic: Quick Communication - See Telephone Encounter >> May 15, 2018 10:53 AM Neva Seat wrote: Pt needing K. Clark's nurse to call her back regarding her handicap parking form.  Pt is needing to know if K. Clark can sign it. Please call pt back asap.  She can bring it by to be signed.

## 2018-05-15 NOTE — Telephone Encounter (Signed)
Completed and placed in Chans inbox. 

## 2018-05-18 ENCOUNTER — Ambulatory Visit: Payer: PPO | Admitting: Anesthesiology

## 2018-05-18 ENCOUNTER — Ambulatory Visit
Admission: RE | Admit: 2018-05-18 | Discharge: 2018-05-18 | Disposition: A | Discharge status: Home / Self Care | Payer: PPO | Source: Ambulatory Visit | Attending: Gastroenterology | Admitting: Gastroenterology

## 2018-05-18 ENCOUNTER — Encounter
Admission: RE | Disposition: A | Discharge status: Home / Self Care | Payer: Self-pay | Source: Ambulatory Visit | Attending: Gastroenterology

## 2018-05-18 ENCOUNTER — Encounter: Payer: Self-pay | Admitting: *Deleted

## 2018-05-18 DIAGNOSIS — M1990 Unspecified osteoarthritis, unspecified site: Secondary | ICD-10-CM | POA: Insufficient documentation

## 2018-05-18 DIAGNOSIS — F329 Major depressive disorder, single episode, unspecified: Secondary | ICD-10-CM | POA: Insufficient documentation

## 2018-05-18 DIAGNOSIS — Z86010 Personal history of colonic polyps: Secondary | ICD-10-CM | POA: Diagnosis not present

## 2018-05-18 DIAGNOSIS — Z79899 Other long term (current) drug therapy: Secondary | ICD-10-CM | POA: Insufficient documentation

## 2018-05-18 DIAGNOSIS — K219 Gastro-esophageal reflux disease without esophagitis: Secondary | ICD-10-CM | POA: Diagnosis not present

## 2018-05-18 DIAGNOSIS — R140 Abdominal distension (gaseous): Secondary | ICD-10-CM | POA: Diagnosis not present

## 2018-05-18 DIAGNOSIS — F419 Anxiety disorder, unspecified: Secondary | ICD-10-CM | POA: Diagnosis not present

## 2018-05-18 DIAGNOSIS — K589 Irritable bowel syndrome without diarrhea: Secondary | ICD-10-CM | POA: Insufficient documentation

## 2018-05-18 DIAGNOSIS — I341 Nonrheumatic mitral (valve) prolapse: Secondary | ICD-10-CM | POA: Insufficient documentation

## 2018-05-18 DIAGNOSIS — K259 Gastric ulcer, unspecified as acute or chronic, without hemorrhage or perforation: Secondary | ICD-10-CM | POA: Diagnosis not present

## 2018-05-18 DIAGNOSIS — F17210 Nicotine dependence, cigarettes, uncomplicated: Secondary | ICD-10-CM | POA: Insufficient documentation

## 2018-05-18 DIAGNOSIS — K3189 Other diseases of stomach and duodenum: Secondary | ICD-10-CM | POA: Diagnosis not present

## 2018-05-18 DIAGNOSIS — K2950 Unspecified chronic gastritis without bleeding: Secondary | ICD-10-CM | POA: Diagnosis not present

## 2018-05-18 DIAGNOSIS — Z1211 Encounter for screening for malignant neoplasm of colon: Secondary | ICD-10-CM | POA: Insufficient documentation

## 2018-05-18 DIAGNOSIS — K635 Polyp of colon: Secondary | ICD-10-CM | POA: Diagnosis not present

## 2018-05-18 DIAGNOSIS — R112 Nausea with vomiting, unspecified: Secondary | ICD-10-CM

## 2018-05-18 DIAGNOSIS — D124 Benign neoplasm of descending colon: Secondary | ICD-10-CM | POA: Diagnosis not present

## 2018-05-18 HISTORY — PX: COLONOSCOPY WITH PROPOFOL: SHX5780

## 2018-05-18 HISTORY — PX: ESOPHAGOGASTRODUODENOSCOPY (EGD) WITH PROPOFOL: SHX5813

## 2018-05-18 SURGERY — COLONOSCOPY WITH PROPOFOL
Anesthesia: General

## 2018-05-18 MED ORDER — PROPOFOL 500 MG/50ML IV EMUL
INTRAVENOUS | Status: DC | PRN
  Administered 2018-05-18: 100 ug/kg/min via INTRAVENOUS

## 2018-05-18 MED ORDER — PROPOFOL 10 MG/ML IV BOLUS
INTRAVENOUS | Status: AC
  Filled 2018-05-18: qty 20

## 2018-05-18 MED ORDER — ONDANSETRON HCL 4 MG/2ML IJ SOLN: 4.0000 mg | Freq: Once | INTRAMUSCULAR | Status: DC | PRN

## 2018-05-18 MED ORDER — SODIUM CHLORIDE 0.9 % IV SOLN
INTRAVENOUS | Status: DC
  Administered 2018-05-18 (×2): via INTRAVENOUS

## 2018-05-18 MED ORDER — PROPOFOL 10 MG/ML IV BOLUS
INTRAVENOUS | Status: DC | PRN
  Administered 2018-05-18: 20 mg via INTRAVENOUS

## 2018-05-18 MED ORDER — FENTANYL CITRATE (PF) 100 MCG/2ML IJ SOLN: 25.0000 ug | INTRAMUSCULAR | Status: DC | PRN

## 2018-05-18 NOTE — H&P (Signed)
[name], MD [address]  [place]  [place], [place] [address]  Main: [phone]  Fax: [phone] Pager: [phone]  Primary Care Physician:  Pleas Koch, NP Primary Gastroenterologist:  Dr. [name]  Pre-Procedure History & Physical: HPI:  Karen Edwards is a 52 y.o. female is here for an endoscopy and colonoscopy.   Past Medical History:  Diagnosis Date  . Anxiety   . Chronic back pain   . Degenerative joint disease   . Depression   . Hypotension   . IBS (irritable bowel syndrome) 05/2015  . MVP (mitral valve prolapse)     Past Surgical History:  Procedure Laterality Date  . ANKLE SURGERY Right 03/02/2014   repair of tendon and sural nerve right ankle  . CERVICAL SPINE SURGERY  02/21/2016   arthrodesis of anterior cervical spine (fusion of C5-6,C6-7 and insertion of bone allograft  . CESAREAN SECTION    . CESAREAN SECTION WITH BILATERAL TUBAL LIGATION  1990  . COLONOSCOPY  04/19/2015  . HEMORRHOID SURGERY N/A 06/16/2015   Procedure: HEMORRHOIDECTOMY;  Surgeon: Leonie Green, MD;  Location: [facility] ORS;  Service: General;  Laterality: N/A;  . HYSTEROSCOPY W/D&C  12/24/2013    Prior to Admission medications   Medication Sig Start Date End Date Taking? Authorizing Provider  atorvastatin (LIPITOR) 10 MG tablet Take 1 tablet (10 mg total) by mouth daily. 02/10/18   Pleas Koch, NP  Cyanocobalamin (B-12 PO) Take 2 tablets by mouth every other day.     [provider]  FLUoxetine (PROZAC) 20 MG capsule Take 3 capsules (60 mg total) by mouth daily. 12/16/17   Pleas Koch, NP  Green Tea, Camillia sinensis, (GREEN TEA PO) Take 2 capsules by mouth daily.     [provider]  HYDROcodone-acetaminophen (NORCO/VICODIN) 5-325 MG tablet Take 1 tablet by mouth 2 (two) times daily. 05/14/18 06/13/18  Milinda Pointer, MD  linaclotide [facility]) 290 MCG CAPS capsule Take 1 capsule (290 mcg total) by mouth daily. 05/12/18    Lin Landsman, MD  methocarbamol (ROBAXIN) 500 MG tablet TAKE 1 TABLET BY MOUTH EVERY 8 HOURS AS NEEDED FOR MUSCLE SPASM 05/14/18   Vevelyn Francois, NP  Potassium 99 MG TABS Take 1 tablet by mouth daily.    [provider]  SUPREP BOWEL PREP KIT 17.5-3.13-1.6 GM/177ML SOLN See admin instructions. 05/11/18   [provider]  traZODone (DESYREL) 100 MG tablet Take 3 tablets (300 mg total) by mouth at bedtime. 12/16/17   Pleas Koch, NP    Allergies as of 05/12/2018 - Review Complete 05/11/2018  Allergen Reaction Noted  . No known allergies  02/07/2015    Family History  Problem Relation Age of Onset  . Arthritis Mother   . Depression Mother   . Hyperlipidemia Mother   . Hypertension Mother   . Kidney disease Mother   . Vision loss Mother   . Hypothyroidism Mother   . Hearing loss Father   . Hyperlipidemia Father   . Hypertension Father   . Diabetes Mellitus I Maternal Grandmother   . Stroke Maternal Grandmother   . Congestive Heart Failure Maternal Grandfather   . Heart attack Maternal Grandfather   . Prostate cancer Paternal Grandfather   . Thyroid disease Maternal Aunt   . Diabetes Maternal Aunt   . Gout Maternal Aunt     Social History   Socioeconomic History  . Marital status: Married    Spouse name: Not on  file  . Number of children: 2  . Years of education: Not on file  . Highest education level: Not on file  Occupational History  . Occupation: disabled  Social Needs  . Financial resource strain: Not hard at all  . Food insecurity:    Worry: Patient refused    Inability: Patient refused  . Transportation needs:    Medical: Patient refused    Non-medical: Patient refused  Tobacco Use  . Smoking status: Current Every Day Smoker    Packs/day: 0.10    Years: 37.00    Pack years: 3.70    Types: Cigarettes  . Smokeless tobacco: Never Used  Substance and Sexual Activity  . Alcohol use: Yes    Alcohol/week: 0.6 oz    Types: 1 Cans  of beer per week    Comment: drinks an occasional beer  . Drug use: No  . Sexual activity: Yes    Birth control/protection: None, Surgical    Comment: tubal ligation  Lifestyle  . Physical activity:    Days per week: Patient refused    Minutes per session: Patient refused  . Stress: Not at all  Relationships  . Social connections:    Talks on phone: Patient refused    Gets together: Patient refused    Attends religious service: Patient refused    Active member of club or organization: Patient refused    Attends meetings of clubs or organizations: Patient refused    Relationship status: Patient refused  . Intimate partner violence:    Fear of current or ex partner: Patient refused    Emotionally abused: Patient refused    Physically abused: Patient refused    Forced sexual activity: Patient refused  Other Topics Concern  . Not on file  Social History Narrative   Married.   2 children. One boy and one girl.   On disability.   Playing with her dogs, gardening.    Review of Systems: See HPI, otherwise negative ROS  Physical Exam: BP 103/68   Pulse 93   Temp (!) 95.3 F (35.2 C) (Tympanic)   Resp 18   Ht 5' 1"  (1.549 m)   Wt 137 lb (62.1 kg)   LMP 05/05/2017 (Exact Date)   SpO2 99%   BMI 25.89 kg/m  General:   Alert,  pleasant and cooperative in NAD Head:  Normocephalic and atraumatic. Neck:  Supple; no masses or thyromegaly. Lungs:  Clear throughout to auscultation.    Heart:  Regular rate and rhythm. Abdomen:  Soft, nontender and nondistended. Normal bowel sounds, without guarding, and without rebound.   Neurologic:  Alert and  oriented x4;  grossly normal neurologically.  Impression/Plan: Karen Edwards is here for an endoscopy and colonoscopy to be performed for postprandial nausea and vomiting, h/o colon polyps  Risks, benefits, limitations, and alternatives regarding  endoscopy and colonoscopy have been reviewed with the patient.  Questions have been  answered.  All parties agreeable.   Sherri Sear, MD  05/18/2018, 9:32 AM

## 2018-05-18 NOTE — Op Note (Signed)
[facility] Gastroenterology Patient Name: Karen Edwards Procedure Date: 05/18/2018 10:30 AM MRN: [id number] Account #: [id number] Date of Birth: [birth year] Admit Type: Outpatient Age: 52 Room: [facility] ENDO ROOM 2 Gender: Female Note Status: Finalized Procedure:            Colonoscopy Indications:          High risk colon cancer surveillance: Personal history                        of colonic polyps Providers:            Lin Landsman MD, MD Medicines:            Monitored Anesthesia Care Complications:        No immediate complications. Estimated blood loss: None. Procedure:            Pre-Anesthesia Assessment:                       - Prior to the procedure, a History and Physical was                        performed, and patient medications and allergies were                        reviewed. The patient is competent. The risks and                        benefits of the procedure and the sedation options and                        risks were discussed with the patient. All questions                        were answered and informed consent was obtained.                        Patient identification and proposed procedure were                        verified by the physician, the nurse, the                        anesthesiologist, the anesthetist and the technician in                        the pre-procedure area in the procedure room in the                        endoscopy suite. Mental Status Examination: alert and                        oriented. Airway Examination: normal oropharyngeal                        airway and neck mobility. Respiratory Examination:                        clear to auscultation. CV Examination: normal.  Prophylactic Antibiotics: The patient does not require                        prophylactic antibiotics. Prior Anticoagulants: The                        patient has taken no previous anticoagulant or                         antiplatelet agents. ASA Grade Assessment: II - A                        patient with mild systemic disease. After reviewing the                        risks and benefits, the patient was deemed in                        satisfactory condition to undergo the procedure. The                        anesthesia plan was to use monitored anesthesia care                        (MAC). Immediately prior to administration of                        medications, the patient was re-assessed for adequacy                        to receive sedatives. The heart rate, respiratory rate,                        oxygen saturations, blood pressure, adequacy of                        pulmonary ventilation, and response to care were                        monitored throughout the procedure. The physical status                        of the patient was re-assessed after the procedure.                       After obtaining informed consent, the colonoscope was                        passed under direct vision. Throughout the procedure,                        the patient's blood pressure, pulse, and oxygen                        saturations were monitored continuously. The                        Colonoscope was introduced through the anus and  advanced to the the cecum, identified by appendiceal                        orifice and ileocecal valve. The colonoscopy was                        technically difficult and complex due to significant                        looping. Successful completion of the procedure was                        aided by applying abdominal pressure. The patient                        tolerated the procedure well. The quality of the bowel                        preparation was evaluated using the BBPS [facility] Bowel                        Preparation Scale) with scores of: Right Colon = 3,                        Transverse Colon = 3 and Left Colon = 3 (entire  mucosa                        seen well with no residual staining, small fragments of                        stool or opaque liquid). The total BBPS score equals 9. Findings:      The perianal and digital rectal examinations were normal. Pertinent       negatives include normal sphincter tone and no palpable rectal lesions.      A diminutive polyp was found in the descending colon. The polyp was       sessile. The polyp was removed with a cold biopsy forceps. Resection and       retrieval were complete.      A 5 mm polyp was found in the descending colon. The polyp was sessile.       The polyp was removed with a cold snare. Resection and retrieval were       complete.      The retroflexed view of the distal rectum and anal verge was normal and       showed no anal or rectal abnormalities. Impression:           - One diminutive polyp in the descending colon, removed                        with a cold biopsy forceps. Resected and retrieved.                       - One 5 mm polyp in the descending colon, removed with                        a cold snare. Resected and retrieved.                       -  The distal rectum and anal verge are normal on                        retroflexion view. Recommendation:       - Discharge patient to home (with escort).                       - Resume previous diet today.                       - Continue present medications.                       - Await pathology results.                       - Repeat colonoscopy in 5 years for surveillance based                        on pathology results.                       - Return to my office as previously scheduled. Procedure Code(s):    --- Professional ---                       [phone], Colonoscopy, flexible; with removal of tumor(s),                        polyp(s), or other lesion(s) by snare technique                       [id number], 74, Colonoscopy, flexible; with biopsy, single                        or  multiple Diagnosis Code(s):    --- Professional ---                       Z86.010, Personal history of colonic polyps                       D12.4, Benign neoplasm of descending colon CPT copyright 2017 American Medical Association. All rights reserved. The codes documented in this report are preliminary and upon coder review may  be revised to meet current compliance requirements. Dr. Ulyess Mort Lin Landsman MD, MD 05/18/2018 11:28:10 AM This report has been signed electronically. Number of Addenda: 0 Note Initiated On: 05/18/2018 10:30 AM Scope Withdrawal Time: 0 hours 19 minutes 13 seconds  Total Procedure Duration: 0 hours 24 minutes 51 seconds       [facility]

## 2018-05-18 NOTE — Telephone Encounter (Signed)
Message left for patient to return my call.  Form is left in the front office for patient to pick up.

## 2018-05-18 NOTE — Anesthesia Preprocedure Evaluation (Signed)
Anesthesia Evaluation  Patient identified by MRN, date of birth, ID band Patient awake    Reviewed: Allergy & Precautions, H&P , NPO status , Patient's Chart, lab work & pertinent test results, reviewed documented beta blocker date and time   History of Anesthesia Complications Negative for: history of anesthetic complications  Airway Mallampati: II  TM Distance: >3 FB Neck ROM: full    Dental no notable dental hx. (+) Teeth Intact   Pulmonary neg shortness of breath, neg sleep apnea, neg COPD, neg recent URI, Current Smoker,    Pulmonary exam normal breath sounds clear to auscultation       Cardiovascular Exercise Tolerance: Good (-) hypertension(-) angina(-) CAD, (-) Past MI and (-) CABG negative cardio ROS Normal cardiovascular exam(-) dysrhythmias + Valvular Problems/Murmurs MVP  Rhythm:regular Rate:Normal     Neuro/Psych neg Seizures PSYCHIATRIC DISORDERS (depression) Anxiety Depression  Neuromuscular disease (lumbar radiculopathy)    GI/Hepatic Neg liver ROS, GERD (no current symptoms)  ,  Endo/Other  negative endocrine ROS  Renal/GU negative Renal ROS  negative genitourinary   Musculoskeletal  (+) Arthritis , Osteoarthritis,    Abdominal   Peds negative pediatric ROS (+)  Hematology negative hematology ROS (+)   Anesthesia Other Findings Past Medical History:   MVP (mitral valve prolapse)                                  Depression                                                   Chronic back pain                                            Hypotension                                                  IBS (irritable bowel syndrome)                  05/2015       Reproductive/Obstetrics negative OB ROS                             Anesthesia Physical  Anesthesia Plan  ASA: II  Anesthesia Plan: General   Post-op Pain Management:    Induction: Intravenous  PONV Risk Score and  Plan: TIVA  Airway Management Planned: Nasal Cannula  Additional Equipment:   Intra-op Plan:   Post-operative Plan:   Informed Consent: I have reviewed the patients History and Physical, chart, labs and discussed the procedure including the risks, benefits and alternatives for the proposed anesthesia with the patient or authorized representative who has indicated his/her understanding and acceptance.   Dental Advisory Given  Plan Discussed with: Anesthesiologist, CRNA and Surgeon  Anesthesia Plan Comments:         Anesthesia Quick Evaluation

## 2018-05-18 NOTE — Op Note (Signed)
[facility] Gastroenterology Patient Name: Karen Edwards Procedure Date: 05/18/2018 10:30 AM MRN: [id number] Account #: [id number] Date of Birth: August 09, 1966 Admit Type: Outpatient Age: 52 Room: [facility] ENDO ROOM 2 Gender: Female Note Status: Finalized Procedure:            Upper GI endoscopy Indications:          Abdominal distention, Nausea with vomiting Providers:            Lin Landsman MD, MD Referring MD:         Pleas Koch (Referring MD) Medicines:            Monitored Anesthesia Care Complications:        No immediate complications. Estimated blood loss: None. Procedure:            Pre-Anesthesia Assessment:                       - Prior to the procedure, a History and Physical was                        performed, and patient medications and allergies were                        reviewed. The patient is competent. The risks and                        benefits of the procedure and the sedation options and                        risks were discussed with the patient. All questions                        were answered and informed consent was obtained.                        Patient identification and proposed procedure were                        verified by the physician, the nurse, the                        anesthesiologist, the anesthetist and the technician in                        the pre-procedure area in the procedure room in the                        endoscopy suite. Mental Status Examination: alert and                        oriented. Airway Examination: normal oropharyngeal                        airway and neck mobility. Respiratory Examination:                        clear to auscultation. CV Examination: normal.                        Prophylactic Antibiotics: The patient does not require  prophylactic antibiotics. Prior Anticoagulants: The                        patient has taken no previous anticoagulant or                         antiplatelet agents. ASA Grade Assessment: II - A                        patient with mild systemic disease. After reviewing the                        risks and benefits, the patient was deemed in                        satisfactory condition to undergo the procedure. The                        anesthesia plan was to use monitored anesthesia care                        (MAC). Immediately prior to administration of                        medications, the patient was re-assessed for adequacy                        to receive sedatives. The heart rate, respiratory rate,                        oxygen saturations, blood pressure, adequacy of                        pulmonary ventilation, and response to care were                        monitored throughout the procedure. The physical status                        of the patient was re-assessed after the procedure.                       After obtaining informed consent, the endoscope was                        passed under direct vision. Throughout the procedure,                        the patient's blood pressure, pulse, and oxygen                        saturations were monitored continuously. The Endoscope                        was introduced through the mouth, and advanced to the                        second part of duodenum. The upper GI endoscopy was  accomplished without difficulty. The patient tolerated                        the procedure well. Findings:      The duodenal bulb and second portion of the duodenum were normal.       Biopsies for histology were taken with a cold forceps for evaluation of       celiac disease.      Diffuse mildly erythematous mucosa without bleeding was found in the       gastric body and in the gastric antrum. Biopsies were taken with a cold       forceps for Helicobacter pylori testing.      A few dispersed, diminutive non-bleeding erosions were found in the        gastric fundus. There were no stigmata of recent bleeding.      The gastroesophageal junction and examined esophagus were normal. Impression:           - Normal duodenal bulb and second portion of the                        duodenum. Biopsied.                       - Erythematous mucosa in the gastric body and antrum.                        Biopsied.                       - Non-bleeding erosive gastropathy.                       - Normal gastroesophageal junction and esophagus. Recommendation:       - Await pathology results.                       - Continue present medications.                       - No ibuprofen, naproxen, or other non-steroidal                        anti-inflammatory drugs.                       - Proceed with colonoscopy as scheduled                       See colonoscopy report Procedure Code(s):    --- Professional ---                       [phone], Esophagogastroduodenoscopy, flexible, transoral;                        with biopsy, single or multiple Diagnosis Code(s):    --- Professional ---                       K31.89, Other diseases of stomach and duodenum                       R14.0, Abdominal distension (gaseous)  R11.2, Nausea with vomiting, unspecified CPT copyright 2017 American Medical Association. All rights reserved. The codes documented in this report are preliminary and upon coder review may  be revised to meet current compliance requirements. Dr. Ulyess Mort Lin Landsman MD, MD 05/18/2018 10:57:27 AM This report has been signed electronically. Number of Addenda: 0 Note Initiated On: 05/18/2018 10:30 AM      [facility]

## 2018-05-18 NOTE — Transfer of Care (Signed)
Immediate Anesthesia Transfer of Care Note  Patient: Karen Edwards  Procedure(s) Performed: COLONOSCOPY WITH PROPOFOL (N/A ) ESOPHAGOGASTRODUODENOSCOPY (EGD) WITH PROPOFOL (N/A )  Patient Location: PACU  Anesthesia Type:General  Level of Consciousness: awake, alert  and oriented  Airway & Oxygen Therapy: Patient Spontanous Breathing  Post-op Assessment: Report given to RN  Post vital signs: Reviewed and stable  Last Vitals:  Vitals Value Taken Time  BP    Temp    Pulse 75 05/18/2018 11:33 AM  Resp 16 05/18/2018 11:33 AM  SpO2 98 % 05/18/2018 11:33 AM  Vitals shown include unvalidated device data.  Last Pain:  Vitals:   05/18/18 0931  TempSrc: Tympanic         Complications: No apparent anesthesia complications

## 2018-05-18 NOTE — Anesthesia Post-op Follow-up Note (Signed)
Anesthesia QCDR form completed.        

## 2018-05-19 ENCOUNTER — Encounter: Payer: Self-pay | Admitting: Gastroenterology

## 2018-05-19 ENCOUNTER — Ambulatory Visit: Payer: PPO | Admitting: Nurse Practitioner

## 2018-05-19 LAB — SURGICAL PATHOLOGY

## 2018-05-19 NOTE — Anesthesia Postprocedure Evaluation (Signed)
Anesthesia Post Note  Patient: Karen Edwards  Procedure(s) Performed: COLONOSCOPY WITH PROPOFOL (N/A ) ESOPHAGOGASTRODUODENOSCOPY (EGD) WITH PROPOFOL (N/A )  Patient location during evaluation: PACU Anesthesia Type: General Level of consciousness: awake and alert and oriented Pain management: pain level controlled Vital Signs Assessment: post-procedure vital signs reviewed and stable Respiratory status: spontaneous breathing Cardiovascular status: blood pressure returned to baseline Anesthetic complications: no     Last Vitals:  Vitals:   05/18/18 1154 05/18/18 1204  BP: (!) 107/57 115/62  Pulse:    Resp:    Temp:    SpO2:      Last Pain:  Vitals:   05/18/18 1204  TempSrc:   PainSc: 0-No pain                 Ruthy Forry

## 2018-05-19 NOTE — Progress Notes (Signed)
Patient's Name: Karen Edwards  MRN: [id number]  Referring Provider: Pleas Koch, NP  DOB: June 26, 1966  PCP: Pleas Koch, NP  DOS: 05/20/2018  Surgeon: Gaspar Cola, MD  Service setting: Ambulatory outpatient  Specialty: Interventional Pain Management  Patient type: Established  Location: [facility] (AMB) Pain Management Facility  Visit type: Post-operative Evaluation   Primary Reason(s) for Visit: Encounter for post-procedure evaluation of chronic illness with mild to moderate exacerbation CC: Back Pain  HPI  Karen Edwards is a 52 y.o. year old, female patient, who comes today for a post-procedure evaluation. She has Chronic low back pain (Primary Area of Pain) (Bilateral) (R>L); Menorrhagia; Fatigue; DDD (degenerative disc disease), lumbar; DDD (degenerative disc disease), cervical; Chronic occipital neuralgia (Fifth Area of Pain) (Bilateral) (R>L); Lumbar facet syndrome (Bilateral) (L>R); DDD (degenerative disc disease), lumbosacral; Lumbar radicular pain (Right) (L4); Sacroiliac joint dysfunction (Bilateral); Depression; Hemorrhoids; Insomnia; Chronic constipation; Pharmacologic therapy; Hyperlipidemia, unspecified; Chronic neck pain (Fourth Area of Pain) (Bilateral) (R>L); GERD (gastroesophageal reflux disease); Numbness and tingling of upper extremity (Right); Chronic upper extremity pain (Bilateral) (R>L); Chronic sacroiliac joint pain (Bilateral) (L>R); Lumbar facet hypertrophy (Bilateral); L1-2 disc extrusion (Right); Cervical foraminal stenosis (C5-6) (Bilateral); Long term prescription opiate use; Opiate use; Disorder of skeletal system; Problems influencing health status; Chronic pain syndrome; Chronic cervical radicular pain (Right: C6/C7) (Left: C5/T1); Chronic lumbar radicular pain; Chronic hip pain (Tertiary Area of Pain) (Bilateral) (R>L); Chronic lower extremity pain (Secondary Area of Pain) (Bilateral) (R>L); Chronic shoulder pain (Bilateral) (L>R); Chronic musculoskeletal  pain; Lumbar spondylosis; Stress incontinence of urine; Cervical facet syndrome; Myofascial pain; Spondylosis without myelopathy or radiculopathy, cervical region; Cervicalgia; Nausea and vomiting; History of colonic polyps; and 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 Her primarily concern today is the Back Pain  Pain Assessment: Location: Lower Back Radiating: Sometimes down right legs Onset: More than a month ago Duration: Chronic pain Quality: Burning, Aching Severity: 4 /10 (subjective, self-reported pain score)  Note: Reported level is compatible with observation.                         When using our objective Pain Scale, levels between 6 and 10/10 are said to belong in an emergency room, as it progressively worsens from a 6/10, described as severely limiting, requiring emergency care not usually available at an outpatient pain management facility. At a 6/10 level, communication becomes difficult and requires great effort. Assistance to reach the emergency department may be required. Facial flushing and profuse sweating along with potentially dangerous increases in heart rate and blood pressure will be evident. Effect on ADL: no prolong standing Timing: Intermittent Modifying factors: medication, rest BP: 112/71  HR: (!) 58  Karen Edwards comes in today for post-op evaluation after No surgery found done on No surgery found.  Further details on both, my assessment(s), as well as the proposed treatment plan, please see below.  Post-operative Assessment  Intra-procedural problems/complications: None observed.         Reported side-effects: None.        Post-surgical adverse reactions or complications: None reported         Laboratory Chemistry  Renal Function Markers Lab Results  Component Value Date   BUN 7 09/08/2017   CREATININE 0.84 09/08/2017   BCR 8 (L) 09/08/2017   GFRAA 93 09/08/2017   GFRNONAA 81 09/08/2017  Hepatic Function Markers Lab Results  Component Value Date   AST 18 09/08/2017   ALT 12 (L) 01/30/2017   ALBUMIN 4.7 09/08/2017   ALKPHOS 73 09/08/2017                        Electrolytes Lab Results  Component Value Date   NA 139 09/08/2017   K 4.4 09/08/2017   CL 99 09/08/2017   CALCIUM 9.8 09/08/2017   MG 2.1 09/08/2017                        Hematology Lab Results  Component Value Date   WBC 9.0 05/20/2017   RBC 4.33 05/20/2017   HGB 13.5 05/20/2017   HCT 40.5 05/20/2017   PLT 202.0 05/20/2017                         Surgery screening No results found for: MRSAPCR, STAPHAUREUS  Note: Lab results reviewed prior to surgical intervention.  Recent Diagnostic Imaging Results  DG C-Arm 1-60 Min-No Report Fluoroscopy was utilized by the requesting physician.  No radiographic  interpretation.   Complexity Note: Imaging results reviewed. Results shared with Karen Edwards, using Layman's terms.                         Meds   Current Outpatient Medications:  .  atorvastatin (LIPITOR) 10 MG tablet, Take 1 tablet (10 mg total) by mouth daily., Disp: 90 tablet, Rfl: 3 .  Cyanocobalamin (B-12 PO), Take 2 tablets by mouth every other day. , Disp: , Rfl:  .  FLUoxetine (PROZAC) 20 MG capsule, Take 3 capsules (60 mg total) by mouth daily., Disp: 270 capsule, Rfl: 3 .  Green Tea, Camillia sinensis, (GREEN TEA PO), Take 2 capsules by mouth daily. , Disp: , Rfl:  .  HYDROcodone-acetaminophen (NORCO/VICODIN) 5-325 MG tablet, Take 1 tablet by mouth 2 (two) times daily., Disp: 60 tablet, Rfl: 0 .  linaclotide (LINZESS) 290 MCG CAPS capsule, Take 1 capsule (290 mcg total) by mouth daily., Disp: 30 capsule, Rfl: 3 .  methocarbamol (ROBAXIN) 500 MG tablet, TAKE 1 TABLET BY MOUTH EVERY 8 HOURS AS NEEDED FOR MUSCLE SPASM, Disp: 90 tablet, Rfl: 1 .  Potassium 99 MG TABS, Take 1 tablet by mouth daily., Disp: , Rfl:  .  SUPREP BOWEL PREP KIT 17.5-3.13-1.6 GM/177ML SOLN,  See admin instructions., Disp: , Rfl: 0 .  traZODone (DESYREL) 100 MG tablet, Take 3 tablets (300 mg total) by mouth at bedtime., Disp: 270 tablet, Rfl: 3  ROS  Constitutional: Denies any fever or chills Gastrointestinal: No reported hemesis, hematochezia, vomiting, or acute GI distress Musculoskeletal: Denies any acute onset joint swelling, redness, loss of ROM, or weakness Neurological: No reported episodes of acute onset apraxia, aphasia, dysarthria, agnosia, amnesia, paralysis, loss of coordination, or loss of consciousness  Allergies  Karen Edwards is allergic to no known allergies.  PFSH  Drug: Karen Edwards  reports that she does not use drugs. Alcohol:  reports that she drinks about 0.6 oz of alcohol per week. Tobacco:  reports that she has been smoking cigarettes.  She has a 3.70 pack-year smoking history. She has never used smokeless tobacco. Medical:  has a past medical history of Anxiety, Chronic back pain, Degenerative joint disease, Depression, Hypotension, IBS (irritable bowel syndrome) (05/2015), and MVP (mitral valve prolapse). Surgical: Karen Edwards  has a past   surgical history that includes Cesarean section; Ankle surgery (Right, 03/02/2014); Hemorrhoid surgery (N/A, 06/16/2015); Cervical spine surgery (02/21/2016); Cesarean section with bilateral tubal ligation (1990); Colonoscopy (04/19/2015); Hysteroscopy w/D&C (12/24/2013); Colonoscopy with propofol (N/A, 05/18/2018); and Esophagogastroduodenoscopy (egd) with propofol (N/A, 05/18/2018). Family: family history includes Arthritis in her mother; Congestive Heart Failure in her maternal grandfather; Depression in her mother; Diabetes in her maternal aunt; Diabetes Mellitus I in her maternal grandmother; Gout in her maternal aunt; Hearing loss in her father; Heart attack in her maternal grandfather; Hyperlipidemia in her father and mother; Hypertension in her father and mother; Hypothyroidism in her mother; Kidney disease in her mother;  Prostate cancer in her paternal grandfather; Stroke in her maternal grandmother; Thyroid disease in her maternal aunt; Vision loss in her mother.  Postop Exam  General appearance: Afebrile. Well nourished, well developed, and well hydrated. In no apparent acute distress. Vitals:   05/20/18 1150  BP: 112/71  Pulse: (!) 58  Temp: 98.4 F (36.9 C)  SpO2: 98%  Weight: 138 lb (62.6 kg)  Height: 5' 1" (1.549 m)   BMI Assessment: Estimated body mass index is 26.07 kg/m as calculated from the following:   Height as of this encounter: 5' 1" (1.549 m).   Weight as of this encounter: 138 lb (62.6 kg). Surgical site: Wound is healing well. No redness, tenderness, discharge, abnormal odors, or any other evidence of infection or complications.  Assessment  Primary Diagnosis & Pertinent Problem List: The primary encounter diagnosis was Chronic low back pain (Primary Area of Pain) (Bilateral) (R>L). 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  Plan of Care  Today's Care: Today we removed the bandages and inspected the wound. The wound was cleaned, the surgical staples removed, steri-strips placed and wound covered.   Procedure Orders     Spinal Cord Stimulator Placement     Cervical Epidural Injection  Interventional management options: Planned, scheduled, and/or pending:   Removal of temporary spinal cord stimulator leads, today.  Schedule patient for permanent SCS implant.   Palliative PRN treatment(s):   None at this time   Provider-requested follow-up: Return for (B) Permanent SCS Implant.  Future Appointments  Date Time Provider [place]   07/13/2018  2:15 PM Vanga, Tally Due, MD [facility] None   Primary Care Physician: Pleas Koch, NP Location: [facility] Outpatient Pain Management Facility Note by: Gaspar Cola, MD Date: 05/20/2018; Time: 1:26 PM

## 2018-05-19 NOTE — H&P (View-Only) (Signed)
Patient's Name: Karen Edwards  MRN: [id number]  Referring Provider: Clark, [name], NP  DOB: 03/27/1966  PCP: Clark, [name], NP  DOS: 05/20/2018  Surgeon: Delane Stalling A Quang Thorpe, MD  Service setting: Ambulatory outpatient  Specialty: Interventional Pain Management  Patient type: Established  Location: [facility] (AMB) Pain Management Facility  Visit type: Post-operative Evaluation   Primary Reason(s) for Visit: Encounter for post-procedure evaluation of chronic illness with mild to moderate exacerbation CC: Back Pain  HPI  Karen Edwards is a 52 y.o. year old, female patient, who comes today for a post-procedure evaluation. She has Chronic low back pain (Primary Area of Pain) (Bilateral) (R>L); Menorrhagia; Fatigue; DDD (degenerative disc disease), lumbar; DDD (degenerative disc disease), cervical; Chronic occipital neuralgia (Fifth Area of Pain) (Bilateral) (R>L); Lumbar facet syndrome (Bilateral) (L>R); DDD (degenerative disc disease), lumbosacral; Lumbar radicular pain (Right) (L4); Sacroiliac joint dysfunction (Bilateral); Depression; Hemorrhoids; Insomnia; Chronic constipation; Pharmacologic therapy; Hyperlipidemia, unspecified; Chronic neck pain (Fourth Area of Pain) (Bilateral) (R>L); GERD (gastroesophageal reflux disease); Numbness and tingling of upper extremity (Right); Chronic upper extremity pain (Bilateral) (R>L); Chronic sacroiliac joint pain (Bilateral) (L>R); Lumbar facet hypertrophy (Bilateral); L1-2 disc extrusion (Right); Cervical foraminal stenosis (C5-6) (Bilateral); Long term prescription opiate use; Opiate use; Disorder of skeletal system; Problems influencing health status; Chronic pain syndrome; Chronic cervical radicular pain (Right: C6/C7) (Left: C5/T1); Chronic lumbar radicular pain; Chronic hip pain (Tertiary Area of Pain) (Bilateral) (R>L); Chronic lower extremity pain (Secondary Area of Pain) (Bilateral) (R>L); Chronic shoulder pain (Bilateral) (L>R); Chronic musculoskeletal  pain; Lumbar spondylosis; Stress incontinence of urine; Cervical facet syndrome; Myofascial pain; Spondylosis without myelopathy or radiculopathy, cervical region; Cervicalgia; Nausea and vomiting; History of colonic polyps; and 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 Her primarily concern today is the Back Pain  Pain Assessment: Location: Lower Back Radiating: Sometimes down right legs Onset: More than a month ago Duration: Chronic pain Quality: Burning, Aching Severity: 4 /10 (subjective, self-reported pain score)  Note: Reported level is compatible with observation.                         When using our objective Pain Scale, levels between 6 and 10/10 are said to belong in an emergency room, as it progressively worsens from a 6/10, described as severely limiting, requiring emergency care not usually available at an outpatient pain management facility. At a 6/10 level, communication becomes difficult and requires great effort. Assistance to reach the emergency department may be required. Facial flushing and profuse sweating along with potentially dangerous increases in heart rate and blood pressure will be evident. Effect on ADL: no prolong standing Timing: Intermittent Modifying factors: medication, rest BP: 112/71  HR: (!) 58  Karen Edwards comes in today for post-op evaluation after No surgery found done on No surgery found.  Further details on both, my assessment(s), as well as the proposed treatment plan, please see below.  Post-operative Assessment  Intra-procedural problems/complications: None observed.         Reported side-effects: None.        Post-surgical adverse reactions or complications: None reported         Laboratory Chemistry  Renal Function Markers Lab Results  Component Value Date   BUN 7 09/08/2017   CREATININE 0.84 09/08/2017   BCR 8 (L) 09/08/2017   GFRAA 93 09/08/2017   GFRNONAA 81 09/08/2017                                Hepatic Function Markers Lab Results  Component Value Date   AST 18 09/08/2017   ALT 12 (L) 01/30/2017   ALBUMIN 4.7 09/08/2017   ALKPHOS 73 09/08/2017                        Electrolytes Lab Results  Component Value Date   NA 139 09/08/2017   K 4.4 09/08/2017   CL 99 09/08/2017   CALCIUM 9.8 09/08/2017   MG 2.1 09/08/2017                        Hematology Lab Results  Component Value Date   WBC 9.0 05/20/2017   RBC 4.33 05/20/2017   HGB 13.5 05/20/2017   HCT 40.5 05/20/2017   PLT 202.0 05/20/2017                         Surgery screening No results found for: MRSAPCR, STAPHAUREUS  Note: Lab results reviewed prior to surgical intervention.  Recent Diagnostic Imaging Results  DG C-Arm 1-60 Min-No Report Fluoroscopy was utilized by the requesting physician.  No radiographic  interpretation.   Complexity Note: Imaging results reviewed. Results shared with Ms. Kobel, using Layman's terms.                         Meds   Current Outpatient Medications:  .  atorvastatin (LIPITOR) 10 MG tablet, Take 1 tablet (10 mg total) by mouth daily., Disp: 90 tablet, Rfl: 3 .  Cyanocobalamin (B-12 PO), Take 2 tablets by mouth every other day. , Disp: , Rfl:  .  FLUoxetine (PROZAC) 20 MG capsule, Take 3 capsules (60 mg total) by mouth daily., Disp: 270 capsule, Rfl: 3 .  Green Tea, Camillia sinensis, (GREEN TEA PO), Take 2 capsules by mouth daily. , Disp: , Rfl:  .  HYDROcodone-acetaminophen (NORCO/VICODIN) 5-325 MG tablet, Take 1 tablet by mouth 2 (two) times daily., Disp: 60 tablet, Rfl: 0 .  linaclotide (LINZESS) 290 MCG CAPS capsule, Take 1 capsule (290 mcg total) by mouth daily., Disp: 30 capsule, Rfl: 3 .  methocarbamol (ROBAXIN) 500 MG tablet, TAKE 1 TABLET BY MOUTH EVERY 8 HOURS AS NEEDED FOR MUSCLE SPASM, Disp: 90 tablet, Rfl: 1 .  Potassium 99 MG TABS, Take 1 tablet by mouth daily., Disp: , Rfl:  .  SUPREP BOWEL PREP KIT 17.5-3.13-1.6 GM/177ML SOLN,  See admin instructions., Disp: , Rfl: 0 .  traZODone (DESYREL) 100 MG tablet, Take 3 tablets (300 mg total) by mouth at bedtime., Disp: 270 tablet, Rfl: 3  ROS  Constitutional: Denies any fever or chills Gastrointestinal: No reported hemesis, hematochezia, vomiting, or acute GI distress Musculoskeletal: Denies any acute onset joint swelling, redness, loss of ROM, or weakness Neurological: No reported episodes of acute onset apraxia, aphasia, dysarthria, agnosia, amnesia, paralysis, loss of coordination, or loss of consciousness  Allergies  Ms. Boyd is allergic to no known allergies.  PFSH  Drug: Ms. Vittorio  reports that she does not use drugs. Alcohol:  reports that she drinks about 0.6 oz of alcohol per week. Tobacco:  reports that she has been smoking cigarettes.  She has a 3.70 pack-year smoking history. She has never used smokeless tobacco. Medical:  has a past medical history of Anxiety, Chronic back pain, Degenerative joint disease, Depression, Hypotension, IBS (irritable bowel syndrome) (05/2015), and MVP (mitral valve prolapse). Surgical: Ms. Momon  has a past   surgical history that includes Cesarean section; Ankle surgery (Right, 03/02/2014); Hemorrhoid surgery (N/A, 06/16/2015); Cervical spine surgery (02/21/2016); Cesarean section with bilateral tubal ligation (1990); Colonoscopy (04/19/2015); Hysteroscopy w/D&C (12/24/2013); Colonoscopy with propofol (N/A, 05/18/2018); and Esophagogastroduodenoscopy (egd) with propofol (N/A, 05/18/2018). Family: family history includes Arthritis in her mother; Congestive Heart Failure in her maternal grandfather; Depression in her mother; Diabetes in her maternal aunt; Diabetes Mellitus I in her maternal grandmother; Gout in her maternal aunt; Hearing loss in her father; Heart attack in her maternal grandfather; Hyperlipidemia in her father and mother; Hypertension in her father and mother; Hypothyroidism in her mother; Kidney disease in her mother;  Prostate cancer in her paternal grandfather; Stroke in her maternal grandmother; Thyroid disease in her maternal aunt; Vision loss in her mother.  Postop Exam  General appearance: Afebrile. Well nourished, well developed, and well hydrated. In no apparent acute distress. Vitals:   05/20/18 1150  BP: 112/71  Pulse: (!) 58  Temp: 98.4 F (36.9 C)  SpO2: 98%  Weight: 138 lb (62.6 kg)  Height: 5' 1" (1.549 m)   BMI Assessment: Estimated body mass index is 26.07 kg/m as calculated from the following:   Height as of this encounter: 5' 1" (1.549 m).   Weight as of this encounter: 138 lb (62.6 kg). Surgical site: Wound is healing well. No redness, tenderness, discharge, abnormal odors, or any other evidence of infection or complications.  Assessment  Primary Diagnosis & Pertinent Problem List: The primary encounter diagnosis was Chronic low back pain (Primary Area of Pain) (Bilateral) (R>L). 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  Plan of Care  Today's Care: Today we removed the bandages and inspected the wound. The wound was cleaned, the surgical staples removed, steri-strips placed and wound covered.   Procedure Orders     Spinal Cord Stimulator Placement     Cervical Epidural Injection  Interventional management options: Planned, scheduled, and/or pending:   Removal of temporary spinal cord stimulator leads, today.  Schedule patient for permanent SCS implant.   Palliative PRN treatment(s):   None at this time   Provider-requested follow-up: Return for (B) Permanent SCS Implant.  Future Appointments  Date Time Provider [place]   07/13/2018  2:15 PM Vanga, Tally Due, MD [facility] None   Primary Care Physician: Pleas Koch, NP Location: [facility] Outpatient [facility] Management Facility Note by: Gaspar Cola, MD Date: 05/20/2018; Time: 1:26 PM

## 2018-05-20 ENCOUNTER — Encounter: Payer: Self-pay | Admitting: Gastroenterology

## 2018-05-20 ENCOUNTER — Other Ambulatory Visit: Payer: Self-pay

## 2018-05-20 ENCOUNTER — Encounter: Payer: Self-pay | Admitting: Pain Medicine

## 2018-05-20 ENCOUNTER — Ambulatory Visit: Payer: PPO | Attending: Pain Medicine | Admitting: Pain Medicine

## 2018-05-20 VITALS — BP 112/71 | HR 58 | Temp 98.4°F | Ht 61.0 in | Wt 138.0 lb

## 2018-05-20 DIAGNOSIS — M5116 Intervertebral disc disorders with radiculopathy, lumbar region: Secondary | ICD-10-CM | POA: Insufficient documentation

## 2018-05-20 DIAGNOSIS — F17210 Nicotine dependence, cigarettes, uncomplicated: Secondary | ICD-10-CM | POA: Insufficient documentation

## 2018-05-20 DIAGNOSIS — M4802 Spinal stenosis, cervical region: Secondary | ICD-10-CM

## 2018-05-20 DIAGNOSIS — G4700 Insomnia, unspecified: Secondary | ICD-10-CM | POA: Diagnosis not present

## 2018-05-20 DIAGNOSIS — M5412 Radiculopathy, cervical region: Secondary | ICD-10-CM

## 2018-05-20 DIAGNOSIS — N393 Stress incontinence (female) (male): Secondary | ICD-10-CM | POA: Diagnosis not present

## 2018-05-20 DIAGNOSIS — M5481 Occipital neuralgia: Secondary | ICD-10-CM | POA: Insufficient documentation

## 2018-05-20 DIAGNOSIS — M79604 Pain in right leg: Secondary | ICD-10-CM

## 2018-05-20 DIAGNOSIS — K581 Irritable bowel syndrome with constipation: Secondary | ICD-10-CM | POA: Insufficient documentation

## 2018-05-20 DIAGNOSIS — F329 Major depressive disorder, single episode, unspecified: Secondary | ICD-10-CM | POA: Insufficient documentation

## 2018-05-20 DIAGNOSIS — K219 Gastro-esophageal reflux disease without esophagitis: Secondary | ICD-10-CM | POA: Diagnosis not present

## 2018-05-20 DIAGNOSIS — N920 Excessive and frequent menstruation with regular cycle: Secondary | ICD-10-CM | POA: Diagnosis not present

## 2018-05-20 DIAGNOSIS — Z79899 Other long term (current) drug therapy: Secondary | ICD-10-CM | POA: Diagnosis not present

## 2018-05-20 DIAGNOSIS — M549 Dorsalgia, unspecified: Secondary | ICD-10-CM | POA: Diagnosis present

## 2018-05-20 DIAGNOSIS — M7918 Myalgia, other site: Secondary | ICD-10-CM | POA: Insufficient documentation

## 2018-05-20 DIAGNOSIS — M25551 Pain in right hip: Secondary | ICD-10-CM

## 2018-05-20 DIAGNOSIS — M5137 Other intervertebral disc degeneration, lumbosacral region: Secondary | ICD-10-CM | POA: Insufficient documentation

## 2018-05-20 DIAGNOSIS — Z9889 Other specified postprocedural states: Secondary | ICD-10-CM | POA: Diagnosis not present

## 2018-05-20 DIAGNOSIS — M79605 Pain in left leg: Secondary | ICD-10-CM

## 2018-05-20 DIAGNOSIS — M25511 Pain in right shoulder: Secondary | ICD-10-CM | POA: Diagnosis not present

## 2018-05-20 DIAGNOSIS — M25512 Pain in left shoulder: Secondary | ICD-10-CM | POA: Insufficient documentation

## 2018-05-20 DIAGNOSIS — Z9851 Tubal ligation status: Secondary | ICD-10-CM | POA: Diagnosis not present

## 2018-05-20 DIAGNOSIS — M25552 Pain in left hip: Secondary | ICD-10-CM

## 2018-05-20 DIAGNOSIS — E785 Hyperlipidemia, unspecified: Secondary | ICD-10-CM | POA: Insufficient documentation

## 2018-05-20 DIAGNOSIS — M542 Cervicalgia: Secondary | ICD-10-CM

## 2018-05-20 DIAGNOSIS — M5441 Lumbago with sciatica, right side: Secondary | ICD-10-CM

## 2018-05-20 DIAGNOSIS — G894 Chronic pain syndrome: Secondary | ICD-10-CM | POA: Insufficient documentation

## 2018-05-20 DIAGNOSIS — M9981 Other biomechanical lesions of cervical region: Secondary | ICD-10-CM

## 2018-05-20 DIAGNOSIS — Z9689 Presence of other specified functional implants: Secondary | ICD-10-CM

## 2018-05-20 DIAGNOSIS — G8929 Other chronic pain: Secondary | ICD-10-CM

## 2018-05-20 DIAGNOSIS — M5442 Lumbago with sciatica, left side: Secondary | ICD-10-CM

## 2018-05-20 MED ORDER — CEPHALEXIN 500 MG PO CAPS: 500.0000 mg | ORAL_CAPSULE | Freq: Four times a day (QID) | ORAL | 0 refills | Status: DC

## 2018-05-20 NOTE — Patient Instructions (Signed)
____________________________________________________________________________________________  Preparing for Procedure with Sedation  Instructions: . Oral Intake: Do not eat or drink anything for at least 8 hours prior to your procedure. . Transportation: Public transportation is not allowed. Bring an adult driver. The driver must be physically present in our waiting room before any procedure can be started. . Physical Assistance: Bring an adult physically capable of assisting you, in the event you need help. This adult should keep you company at home for at least 6 hours after the procedure. . Blood Pressure Medicine: Take your blood pressure medicine with a sip of water the morning of the procedure. . Blood thinners: Notify our staff if you are taking any blood thinners. Depending on which one you take, there will be specific instructions on how and when to stop it. . Diabetics on insulin: Notify the staff so that you can be scheduled 1st case in the morning. If your diabetes requires high dose insulin, take only  of your normal insulin dose the morning of the procedure and notify the staff that you have done so. . Preventing infections: Shower with an antibacterial soap the morning of your procedure. . Build-up your immune system: Take 1000 mg of Vitamin C with every meal (3 times a day) the day prior to your procedure. . Antibiotics: Inform the staff if you have a condition or reason that requires you to take antibiotics before dental procedures. . Pregnancy: If you are pregnant, call and cancel the procedure. . Sickness: If you have a cold, fever, or any active infections, call and cancel the procedure. . Arrival: You must be in the facility at least 30 minutes prior to your scheduled procedure. . Children: Do not bring children with you. . Dress appropriately: Bring dark clothing that you would not mind if they get stained. . Valuables: Do not bring any jewelry or valuables.  Procedure  appointments are reserved for interventional treatments only. . No Prescription Refills. . No medication changes will be discussed during procedure appointments. . No disability issues will be discussed.  Reasons to call and reschedule or cancel your procedure: (Following these recommendations will minimize the risk of a serious complication.) . Surgeries: Avoid having procedures within 2 weeks of any surgery. (Avoid for 2 weeks before or after any surgery). . Flu Shots: Avoid having procedures within 2 weeks of a flu shots or . (Avoid for 2 weeks before or after immunizations). . Barium: Avoid having a procedure within 7-10 days after having had a radiological study involving the use of radiological contrast. (Myelograms, Barium swallow or enema study). . Heart attacks: Avoid any elective procedures or surgeries for the initial 6 months after a "Myocardial Infarction" (Heart Attack). . Blood thinners: It is imperative that you stop these medications before procedures. Let us know if you if you take any blood thinner.  . Infection: Avoid procedures during or within two weeks of an infection (including chest colds or gastrointestinal problems). Symptoms associated with infections include: Localized redness, fever, chills, night sweats or profuse sweating, burning sensation when voiding, cough, congestion, stuffiness, runny nose, sore throat, diarrhea, nausea, vomiting, cold or Flu symptoms, recent or current infections. It is specially important if the infection is over the area that we intend to treat. . Heart and lung problems: Symptoms that may suggest an active cardiopulmonary problem include: cough, chest pain, breathing difficulties or shortness of breath, dizziness, ankle swelling, uncontrolled high or unusually low blood pressure, and/or palpitations. If you are experiencing any of these symptoms, cancel   your procedure and contact your primary care physician for an evaluation.  Remember:   Regular Business hours are:  Monday to Thursday 8:00 AM to 4:00 PM  Provider's Schedule: Joffrey Kerce, MD:  Procedure days: Tuesday and Thursday 7:30 AM to 4:00 PM  Bilal Lateef, MD:  Procedure days: Monday and Wednesday 7:30 AM to 4:00 PM ____________________________________________________________________________________________    

## 2018-05-22 ENCOUNTER — Encounter: Payer: Self-pay | Admitting: Gastroenterology

## 2018-05-25 ENCOUNTER — Telehealth: Payer: Self-pay

## 2018-05-25 NOTE — Telephone Encounter (Signed)
Patient contacted office to inquire on [employer] savings.  I informed her that the drug rep took the savings card over and said she could get 30/$30 but when I called her pharmacy the savings card could not be applied because does not work with her insurance.  I will look into this with the drug rep again. She would like to know what can be done going forward since she is still experiencing the nausea and vomiting.  Please advise.  Thanks [employer]

## 2018-05-27 NOTE — Telephone Encounter (Signed)
Please advise 

## 2018-05-29 ENCOUNTER — Encounter: Payer: Self-pay | Admitting: Gastroenterology

## 2018-06-03 ENCOUNTER — Other Ambulatory Visit: Payer: Self-pay

## 2018-06-03 DIAGNOSIS — R112 Nausea with vomiting, unspecified: Secondary | ICD-10-CM

## 2018-06-03 DIAGNOSIS — R1013 Epigastric pain: Secondary | ICD-10-CM

## 2018-06-10 ENCOUNTER — Other Ambulatory Visit: Payer: Self-pay | Admitting: Pain Medicine

## 2018-06-10 DIAGNOSIS — M79604 Pain in right leg: Secondary | ICD-10-CM

## 2018-06-10 DIAGNOSIS — G8929 Other chronic pain: Secondary | ICD-10-CM

## 2018-06-10 DIAGNOSIS — M5416 Radiculopathy, lumbar region: Secondary | ICD-10-CM

## 2018-06-10 DIAGNOSIS — M5441 Lumbago with sciatica, right side: Secondary | ICD-10-CM

## 2018-06-10 DIAGNOSIS — M5126 Other intervertebral disc displacement, lumbar region: Secondary | ICD-10-CM

## 2018-06-10 DIAGNOSIS — M47816 Spondylosis without myelopathy or radiculopathy, lumbar region: Secondary | ICD-10-CM

## 2018-06-10 DIAGNOSIS — M5136 Other intervertebral disc degeneration, lumbar region: Secondary | ICD-10-CM

## 2018-06-10 DIAGNOSIS — G894 Chronic pain syndrome: Secondary | ICD-10-CM

## 2018-06-10 DIAGNOSIS — M79605 Pain in left leg: Secondary | ICD-10-CM

## 2018-06-10 DIAGNOSIS — M5442 Lumbago with sciatica, left side: Secondary | ICD-10-CM

## 2018-06-11 ENCOUNTER — Ambulatory Visit: Payer: PPO

## 2018-06-11 ENCOUNTER — Other Ambulatory Visit: Payer: PPO

## 2018-06-11 ENCOUNTER — Other Ambulatory Visit: Payer: Self-pay

## 2018-06-11 ENCOUNTER — Encounter
Admission: RE | Admit: 2018-06-11 | Discharge: 2018-06-11 | Disposition: A | Discharge status: Home / Self Care | Payer: PPO | Source: Ambulatory Visit | Attending: Pain Medicine | Admitting: Pain Medicine

## 2018-06-11 DIAGNOSIS — Z01812 Encounter for preprocedural laboratory examination: Secondary | ICD-10-CM | POA: Diagnosis not present

## 2018-06-11 HISTORY — DX: Dyspnea, unspecified: R06.00

## 2018-06-11 LAB — SURGICAL PCR SCREEN
MRSA, PCR: NEGATIVE
Staphylococcus aureus: NEGATIVE

## 2018-06-11 NOTE — Patient Instructions (Signed)
Your procedure is scheduled on: 06/18/18 Thurs Report to Same Day Surgery 2nd floor medical mall [facility] Entrance-take elevator on left to 2nd floor.  Check in with surgery information desk.) To find out your arrival time please call [phone] between 1PM - 3PM on 06/17/18 Wed  Remember: Instructions that are not followed completely may result in serious medical risk, up to and including death, or upon the discretion of your surgeon and anesthesiologist your surgery may need to be rescheduled.    _x___ 1. Do not eat food after midnight the night before your procedure. You may drink clear liquids up to 2 hours before you are scheduled to arrive at the hospital for your procedure.  Do not drink clear liquids within 2 hours of your scheduled arrival to the hospital.  Clear liquids include  --Water or Apple juice without pulp  --Clear carbohydrate beverage such as ClearFast or Gatorade  --Black Coffee or Clear Tea (No milk, no creamers, do not add anything to                  the coffee or Tea Type 1 and type 2 diabetics should only drink water.   ____Ensure clear carbohydrate drink on the way to the hospital for bariatric patients  ____Ensure clear carbohydrate drink 3 hours before surgery for Dr Dwyane Luo patients if physician instructed.   No gum chewing or hard candies.     __x__ 2. No Alcohol for 24 hours before or after surgery.   __x__3. No Smoking or e-cigarettes for 24 prior to surgery.  Do not use any chewable tobacco products for at least 6 hour prior to surgery   ____  4. Bring all medications with you on the day of surgery if instructed.    __x__ 5. Notify your doctor if there is any change in your medical condition     (cold, fever, infections).    x___6. On the morning of surgery brush your teeth with toothpaste and water.  You may rinse your mouth with mouth wash if you wish.  Do not swallow any toothpaste or mouthwash.   Do not wear jewelry, make-up, hairpins,  clips or nail polish.  Do not wear lotions, powders, or perfumes. You may wear deodorant.  Do not shave 48 hours prior to surgery. Men may shave face and neck.  Do not bring valuables to the hospital.    [facility] is not responsible for any belongings or valuables.               Contacts, dentures or bridgework may not be worn into surgery.  Leave your suitcase in the car. After surgery it may be brought to your room.  For patients admitted to the hospital, discharge time is determined by your                       treatment team.  _  Patients discharged the day of surgery will not be allowed to drive home.  You will need someone to drive you home and stay with you the night of your procedure.    Please read over the following fact sheets that you were given:   [facility] Preparing for Surgery and or MRSA Information   _x___ Take anti-hypertensive listed below, cardiac, seizure, asthma,     anti-reflux and psychiatric medicines. These include:  1. FLUoxetine (PROZAC) 20 MG capsule  2.HYDROcodone-acetaminophen (NORCO/VICODIN) 5-325 MG tablet  3.  4.  5.  6.  ____Fleets enema or Magnesium Citrate as directed.   _x___ Use CHG Soap or sage wipes as directed on instruction sheet   ____ Use inhalers on the day of surgery and bring to hospital day of surgery  ____ Stop Metformin and Janumet 2 days prior to surgery.    ____ Take 1/2 of usual insulin dose the night before surgery and none on the morning     surgery.   _x___ Follow recommendations from Cardiologist, Pulmonologist or PCP regarding          stopping Aspirin, Coumadin, Plavix ,Eliquis, Effient, or Pradaxa, and Pletal.  X____Stop Anti-inflammatories such as Advil, Aleve, Ibuprofen, Motrin, Naproxen, Naprosyn, Goodies powders or aspirin products. OK to take Tylenol and                          Celebrex.   _x___ Stop supplements until after surgery.  But may continue Vitamin D, Vitamin B,       and multivitamin.   ____  Bring C-Pap to the hospital.

## 2018-06-18 ENCOUNTER — Ambulatory Visit: Payer: PPO

## 2018-06-18 ENCOUNTER — Ambulatory Visit: Payer: PPO | Admitting: Registered Nurse

## 2018-06-18 ENCOUNTER — Other Ambulatory Visit: Payer: Self-pay

## 2018-06-18 ENCOUNTER — Encounter
Admission: RE | Disposition: A | Discharge status: Home / Self Care | Payer: Self-pay | Source: Ambulatory Visit | Attending: Pain Medicine

## 2018-06-18 ENCOUNTER — Ambulatory Visit
Admission: RE | Admit: 2018-06-18 | Discharge: 2018-06-18 | Disposition: A | Discharge status: Home / Self Care | Payer: PPO | Source: Ambulatory Visit | Attending: Pain Medicine | Admitting: Pain Medicine

## 2018-06-18 ENCOUNTER — Encounter: Payer: Self-pay | Admitting: Pain Medicine

## 2018-06-18 DIAGNOSIS — M51369 Other intervertebral disc degeneration, lumbar region without mention of lumbar back pain or lower extremity pain: Secondary | ICD-10-CM | POA: Diagnosis present

## 2018-06-18 DIAGNOSIS — K219 Gastro-esophageal reflux disease without esophagitis: Secondary | ICD-10-CM | POA: Insufficient documentation

## 2018-06-18 DIAGNOSIS — M5416 Radiculopathy, lumbar region: Secondary | ICD-10-CM | POA: Diagnosis not present

## 2018-06-18 DIAGNOSIS — F418 Other specified anxiety disorders: Secondary | ICD-10-CM | POA: Diagnosis not present

## 2018-06-18 DIAGNOSIS — J449 Chronic obstructive pulmonary disease, unspecified: Secondary | ICD-10-CM | POA: Insufficient documentation

## 2018-06-18 DIAGNOSIS — Z7952 Long term (current) use of systemic steroids: Secondary | ICD-10-CM | POA: Diagnosis not present

## 2018-06-18 DIAGNOSIS — I10 Essential (primary) hypertension: Secondary | ICD-10-CM | POA: Insufficient documentation

## 2018-06-18 DIAGNOSIS — G8929 Other chronic pain: Secondary | ICD-10-CM | POA: Diagnosis not present

## 2018-06-18 DIAGNOSIS — M79605 Pain in left leg: Secondary | ICD-10-CM

## 2018-06-18 DIAGNOSIS — M533 Sacrococcygeal disorders, not elsewhere classified: Secondary | ICD-10-CM

## 2018-06-18 DIAGNOSIS — F17210 Nicotine dependence, cigarettes, uncomplicated: Secondary | ICD-10-CM | POA: Diagnosis not present

## 2018-06-18 DIAGNOSIS — Z79899 Other long term (current) drug therapy: Secondary | ICD-10-CM | POA: Insufficient documentation

## 2018-06-18 DIAGNOSIS — G894 Chronic pain syndrome: Secondary | ICD-10-CM | POA: Diagnosis present

## 2018-06-18 DIAGNOSIS — M545 Low back pain: Secondary | ICD-10-CM | POA: Diagnosis not present

## 2018-06-18 DIAGNOSIS — M5136 Other intervertebral disc degeneration, lumbar region: Secondary | ICD-10-CM | POA: Diagnosis not present

## 2018-06-18 DIAGNOSIS — I341 Nonrheumatic mitral (valve) prolapse: Secondary | ICD-10-CM | POA: Diagnosis present

## 2018-06-18 DIAGNOSIS — M47816 Spondylosis without myelopathy or radiculopathy, lumbar region: Secondary | ICD-10-CM | POA: Diagnosis present

## 2018-06-18 DIAGNOSIS — Z981 Arthrodesis status: Secondary | ICD-10-CM | POA: Diagnosis not present

## 2018-06-18 DIAGNOSIS — F419 Anxiety disorder, unspecified: Secondary | ICD-10-CM | POA: Insufficient documentation

## 2018-06-18 DIAGNOSIS — E785 Hyperlipidemia, unspecified: Secondary | ICD-10-CM | POA: Diagnosis not present

## 2018-06-18 DIAGNOSIS — M5442 Lumbago with sciatica, left side: Secondary | ICD-10-CM

## 2018-06-18 DIAGNOSIS — M5126 Other intervertebral disc displacement, lumbar region: Secondary | ICD-10-CM | POA: Diagnosis present

## 2018-06-18 DIAGNOSIS — M5441 Lumbago with sciatica, right side: Secondary | ICD-10-CM

## 2018-06-18 DIAGNOSIS — F329 Major depressive disorder, single episode, unspecified: Secondary | ICD-10-CM | POA: Insufficient documentation

## 2018-06-18 DIAGNOSIS — M549 Dorsalgia, unspecified: Secondary | ICD-10-CM | POA: Diagnosis present

## 2018-06-18 DIAGNOSIS — M79604 Pain in right leg: Secondary | ICD-10-CM

## 2018-06-18 HISTORY — PX: SPINAL CORD STIMULATOR INSERTION: SHX5378

## 2018-06-18 SURGERY — INSERTION, SPINAL CORD STIMULATOR, LUMBAR
Anesthesia: General | Wound class: Clean

## 2018-06-18 MED ORDER — DEXMEDETOMIDINE HCL IN NACL 200 MCG/50ML IV SOLN
INTRAVENOUS | Status: DC | PRN
  Administered 2018-06-18 (×2): 10 ug via INTRAVENOUS

## 2018-06-18 MED ORDER — KETOROLAC TROMETHAMINE 30 MG/ML IJ SOLN
INTRAMUSCULAR | Status: DC | PRN
  Administered 2018-06-18: 30 mg via INTRAVENOUS

## 2018-06-18 MED ORDER — CEFAZOLIN SODIUM-DEXTROSE 1-4 GM/50ML-% IV SOLN
1.0000 g | Freq: Once | INTRAVENOUS | Status: AC
  Administered 2018-06-18: 1 g via INTRAVENOUS

## 2018-06-18 MED ORDER — CEPHALEXIN 500 MG PO CAPS: 500.0000 mg | ORAL_CAPSULE | Freq: Three times a day (TID) | ORAL | 0 refills | Status: DC

## 2018-06-18 MED ORDER — HYDROCODONE-ACETAMINOPHEN 5-325 MG PO TABS: 1.0000 | ORAL_TABLET | Freq: Four times a day (QID) | ORAL | 0 refills | Status: DC | PRN

## 2018-06-18 MED ORDER — PROPOFOL 500 MG/50ML IV EMUL
INTRAVENOUS | Status: DC | PRN
  Administered 2018-06-18: 50 ug/kg/min via INTRAVENOUS

## 2018-06-18 MED ORDER — FENTANYL CITRATE (PF) 100 MCG/2ML IJ SOLN: 25.0000 ug | INTRAMUSCULAR | Status: DC | PRN

## 2018-06-18 MED ORDER — PROPOFOL 500 MG/50ML IV EMUL
INTRAVENOUS | Status: AC
  Filled 2018-06-18: qty 50

## 2018-06-18 MED ORDER — SODIUM CHLORIDE 0.9 % IJ SOLN
INTRAMUSCULAR | Status: AC
  Filled 2018-06-18: qty 100

## 2018-06-18 MED ORDER — DEXAMETHASONE SODIUM PHOSPHATE 10 MG/ML IJ SOLN
INTRAMUSCULAR | Status: AC
  Filled 2018-06-18: qty 1

## 2018-06-18 MED ORDER — LIDOCAINE HCL (PF) 1 % IJ SOLN
INTRAMUSCULAR | Status: AC
  Filled 2018-06-18: qty 30

## 2018-06-18 MED ORDER — KETOROLAC TROMETHAMINE 30 MG/ML IJ SOLN
INTRAMUSCULAR | Status: AC
  Filled 2018-06-18: qty 1

## 2018-06-18 MED ORDER — LIDOCAINE HCL 1 % IJ SOLN
INTRAMUSCULAR | Status: DC | PRN
  Administered 2018-06-18: 60 mL

## 2018-06-18 MED ORDER — HYDROCODONE-ACETAMINOPHEN 5-325 MG PO TABS: 1.0000 | ORAL_TABLET | ORAL | Status: DC | PRN

## 2018-06-18 MED ORDER — DIPHENHYDRAMINE HCL 50 MG/ML IJ SOLN
INTRAMUSCULAR | Status: AC
  Filled 2018-06-18: qty 1

## 2018-06-18 MED ORDER — VANCOMYCIN HCL IN DEXTROSE 1-5 GM/200ML-% IV SOLN
1000.0000 mg | INTRAVENOUS | Status: AC
  Administered 2018-06-18: 1000 mg via INTRAVENOUS

## 2018-06-18 MED ORDER — BUPIVACAINE HCL (PF) 0.5 % IJ SOLN
INTRAMUSCULAR | Status: AC
Start: 2018-06-18 — End: ?
  Filled 2018-06-18: qty 30

## 2018-06-18 MED ORDER — PROPOFOL 10 MG/ML IV BOLUS
INTRAVENOUS | Status: DC | PRN
  Administered 2018-06-18: 50 mg via INTRAVENOUS

## 2018-06-18 MED ORDER — ONDANSETRON HCL 4 MG/2ML IJ SOLN
INTRAMUSCULAR | Status: AC
  Filled 2018-06-18: qty 2

## 2018-06-18 MED ORDER — HYDROGEN PEROXIDE 3 % EX SOLN
CUTANEOUS | Status: DC | PRN
  Administered 2018-06-18: 1

## 2018-06-18 MED ORDER — GLYCOPYRROLATE 0.2 MG/ML IJ SOLN
INTRAMUSCULAR | Status: DC | PRN
  Administered 2018-06-18: 0.2 mg via INTRAVENOUS

## 2018-06-18 MED ORDER — LACTATED RINGERS IV SOLN
INTRAVENOUS | Status: DC
  Administered 2018-06-18: 12:00:00 via INTRAVENOUS

## 2018-06-18 MED ORDER — CEFAZOLIN SODIUM-DEXTROSE 1-4 GM/50ML-% IV SOLN
INTRAVENOUS | Status: AC
  Filled 2018-06-18: qty 50

## 2018-06-18 MED ORDER — SODIUM CHLORIDE 0.9 % IJ SOLN
INTRAMUSCULAR | Status: DC | PRN
  Administered 2018-06-18: 30 mL

## 2018-06-18 MED ORDER — DEXAMETHASONE SODIUM PHOSPHATE 10 MG/ML IJ SOLN
INTRAMUSCULAR | Status: DC | PRN
  Administered 2018-06-18: 10 mg via INTRAVENOUS

## 2018-06-18 MED ORDER — FENTANYL CITRATE (PF) 100 MCG/2ML IJ SOLN
INTRAMUSCULAR | Status: AC
  Filled 2018-06-18: qty 2

## 2018-06-18 MED ORDER — MIDAZOLAM HCL 2 MG/2ML IJ SOLN
INTRAMUSCULAR | Status: DC | PRN
  Administered 2018-06-18 (×2): 1 mg via INTRAVENOUS

## 2018-06-18 MED ORDER — ONDANSETRON HCL 4 MG/2ML IJ SOLN
INTRAMUSCULAR | Status: DC | PRN
  Administered 2018-06-18: 4 mg via INTRAVENOUS

## 2018-06-18 MED ORDER — LIDOCAINE HCL (PF) 2 % IJ SOLN
INTRAMUSCULAR | Status: AC
  Filled 2018-06-18: qty 10

## 2018-06-18 MED ORDER — OXYCODONE HCL 5 MG/5ML PO SOLN: 5.0000 mg | Freq: Once | ORAL | Status: DC | PRN

## 2018-06-18 MED ORDER — VANCOMYCIN HCL IN DEXTROSE 1-5 GM/200ML-% IV SOLN
INTRAVENOUS | Status: AC
  Administered 2018-06-18: 1000 mg via INTRAVENOUS
  Filled 2018-06-18: qty 200

## 2018-06-18 MED ORDER — MIDAZOLAM HCL 2 MG/2ML IJ SOLN
INTRAMUSCULAR | Status: AC
  Filled 2018-06-18: qty 2

## 2018-06-18 MED ORDER — OXYCODONE HCL 5 MG PO TABS: 5.0000 mg | ORAL_TABLET | Freq: Once | ORAL | Status: DC | PRN

## 2018-06-18 MED ORDER — FAMOTIDINE 20 MG PO TABS
ORAL_TABLET | ORAL | Status: AC
  Administered 2018-06-18: 20 mg via ORAL
  Filled 2018-06-18: qty 1

## 2018-06-18 MED ORDER — FENTANYL CITRATE (PF) 100 MCG/2ML IJ SOLN
INTRAMUSCULAR | Status: DC | PRN
  Administered 2018-06-18 (×4): 25 ug via INTRAVENOUS
  Administered 2018-06-18: 50 ug via INTRAVENOUS

## 2018-06-18 MED ORDER — DIPHENHYDRAMINE HCL 50 MG/ML IJ SOLN
INTRAMUSCULAR | Status: DC | PRN
  Administered 2018-06-18: 25 mg via INTRAVENOUS

## 2018-06-18 MED ORDER — FAMOTIDINE 20 MG PO TABS
20.0000 mg | ORAL_TABLET | Freq: Once | ORAL | Status: AC
  Administered 2018-06-18: 20 mg via ORAL

## 2018-06-18 MED ORDER — CHLORHEXIDINE GLUCONATE CLOTH 2 % EX PADS: 6.0000 | MEDICATED_PAD | Freq: Once | CUTANEOUS | Status: DC

## 2018-06-18 SURGICAL SUPPLY — 46 items
BLADE SURG 15 STRL LF DISP TIS (BLADE) ×1 IMPLANT
BLADE SURG 15 STRL SS (BLADE) ×2
CNTNR SPEC 2.5X3XGRAD LEK (MISCELLANEOUS) ×1
CONT SPEC 4OZ STER OR WHT (MISCELLANEOUS) ×2
CONTAINER SPEC 2.5X3XGRAD LEK (MISCELLANEOUS) ×1 IMPLANT
DECANTER SPIKE VIAL GLASS SM (MISCELLANEOUS) ×6 IMPLANT
DEVICE IMPLANT NEUROSTIMINATOR (Neuro Prosthesis/Implant) ×1 IMPLANT
DEVICE IMPLANT NEUROSTIMULATOR (Neuro Prosthesis/Implant) ×2 IMPLANT
DRAPE C-ARM XRAY 36X54 (DRAPES) ×3 IMPLANT
DRAPE CAMERA CLOSED 9X96 (DRAPES) ×3 IMPLANT
DRAPE INCISE IOBAN 66X45 STRL (DRAPES) ×3 IMPLANT
DRSG TEGADERM 4X4.75 (GAUZE/BANDAGES/DRESSINGS) ×6 IMPLANT
DRSG TELFA 3X8 NADH (GAUZE/BANDAGES/DRESSINGS) ×6 IMPLANT
DURAPREP 26ML APPLICATOR (WOUND CARE) ×3 IMPLANT
ELECT REM PT RETURN 9FT ADLT (ELECTROSURGICAL) ×3
ELECTRODE REM PT RTRN 9FT ADLT (ELECTROSURGICAL) ×1 IMPLANT
GLOVE BIO SURGEON STRL SZ8 (GLOVE) ×9 IMPLANT
GLOVE BIOGEL PI IND STRL 7.0 (GLOVE) ×1 IMPLANT
GLOVE BIOGEL PI INDICATOR 7.0 (GLOVE) ×2
GLOVE SURG XRAY 8.5 LX (GLOVE) ×3 IMPLANT
GOWN STRL REUS W/ TWL LRG LVL3 (GOWN DISPOSABLE) ×2 IMPLANT
GOWN STRL REUS W/ TWL XL LVL3 (GOWN DISPOSABLE) ×1 IMPLANT
GOWN STRL REUS W/TWL LRG LVL3 (GOWN DISPOSABLE) ×4
GOWN STRL REUS W/TWL XL LVL3 (GOWN DISPOSABLE) ×2
KIT LEAD (Lead) ×6 IMPLANT
KIT TURNOVER KIT A (KITS) ×3 IMPLANT
LABEL OR SOLS (LABEL) ×3 IMPLANT
NEEDLE HYPO 25X1 1.5 SAFETY (NEEDLE) ×3 IMPLANT
NEEDLE SPNL 22GX3.5 QUINCKE BK (NEEDLE) ×3 IMPLANT
PACK BASIN MINOR ARMC (MISCELLANEOUS) ×3 IMPLANT
PACK UNIVERSAL (MISCELLANEOUS) ×3 IMPLANT
PENCIL ELECTRO HAND CTR (MISCELLANEOUS) ×3 IMPLANT
RECHARGER INTELLIS (NEUROSURGERY SUPPLIES) ×3 IMPLANT
REPROGRAMMER INTELLIS (NEUROSURGERY SUPPLIES) ×3 IMPLANT
SOL PREP PVP 2OZ (MISCELLANEOUS) ×3
SOLUTION PREP PVP 2OZ (MISCELLANEOUS) ×1 IMPLANT
STAPLER SKIN PROX 35W (STAPLE) ×3 IMPLANT
STIMULATOR WIRELESS 74X79X20MM (MISCELLANEOUS) ×3 IMPLANT
SUT SILK 0 SH 30 (SUTURE) ×3 IMPLANT
SUT SILK 2 0 SH (SUTURE) ×3 IMPLANT
SUT VIC AB 2-0 SH 27 (SUTURE) ×4
SUT VIC AB 2-0 SH 27XBRD (SUTURE) ×2 IMPLANT
SWABSTK COMLB BENZOIN TINCTURE (MISCELLANEOUS) ×3 IMPLANT
SYR 10ML LL (SYRINGE) ×6 IMPLANT
SYR EPIDURAL 5ML GLASS (SYRINGE) ×3 IMPLANT
WATER STERILE IRR 1000ML POUR (IV SOLUTION) ×3 IMPLANT

## 2018-06-18 NOTE — Discharge Instructions (Addendum)
___________________________________________________________________________________________  PATIENT DISCHARGE INSTRUCTIONS FOLLOWING IMPLANTATION OF SPINAL CORD STIMULATOR GENERATOR  You will be discharged from the hospital on the same day as your surgery, after you are fully recovered. Make arrangements to be driven home. DO NOT DRIVE YOURSELF!!   The bandage over the side incision may drain a small amount of blood. This is normal; don?t be alarmed.  Be certain to review all warnings/precautions in the patient brochure accompanying your stimulator.  Please call your referring physician to make an appointment for follow?up care.  Instructions:  Food intake: Start with clear liquids (like water) and advance to regular food, as tolerated.   Physical activities:   Minimize bending, twisting, or awkward positions of the mid? and lower?back.   This is critical to prevent migration of leads, which will change stimulation patterns completely and require further surgery to correct.  Avoid lifting more than: 10 Lbs. for 6 weeks.  Wear the abdominal binder at all times for the next 6 weeks, except when bathing.  After 6 weeks you can gradually resume activities, but continue to avoid strenuous and awkward positions of the back.  Driving: You can start to drive again when you are fully recovered from the anesthesia side?effects (1?2 days).  Blood thinner: Restart your blood thinner 6 hours after your procedure. (Only for those taking blood thinners)  Insulin: As soon as you can eat, you may resume your normal dosing schedule. (Only for those taking insulin)  Wound dressing care: Keep procedure site clean and dry. Clean wound with alcohol, twice a day for the first 7 days, starting 48 hours after the surgery.   Keep incisions clean and dry. Cover with plastic wrap and tape when showering. Do not submerge incisions in a bathtub, pool or spa.  Follow-up appointment: Keep your follow-up  appointment after the procedure. Usually 6-7 days to remove the staples.  Expect:  From numbing medicine (AKA: Local Anesthetics): Numbness or decrease in pain.  Duration: It will depend on the type of local anesthetic used. On the average, 1 to 8 hours.   From procedure: Some discomfort is to be expected once the numbing medicine wears off. This should be minimal. Use your regular pain medicines.  Call if:  You experience numbness and weakness that gets worse with time, as opposed to wearing off.  New onset bowel or bladder incontinence. (Spinal procedures only)  have a temperature over 101.5?  are unable to urinate  have increasing difficulty walking or using your legs  have incisional pain that continues to increase rather than stabilize or improve  have any drainage of pus from your incisions  have drainage which completely saturates the bandage  Emergency Numbers:  [place] business hours (Monday - Thursday, 8:00 AM - 4:00 PM) (Friday, 9:00 AM - 12:00 Noon): [phone]  After hours: [phone] ____________________________________________________________________________________________  AMBULATORY SURGERY  DISCHARGE INSTRUCTIONS   1) The drugs that you were given will stay in your system until tomorrow so for the next 24 hours you should not:  A) Drive an automobile B) Make any legal decisions C) Drink any alcoholic beverage   2) You may resume regular meals tomorrow.  Today it is better to start with liquids and gradually work up to solid foods.  You may eat anything you prefer, but it is better to start with liquids, then soup and crackers, and gradually work up to solid foods.   3) Please notify your doctor immediately if you have any unusual bleeding, trouble breathing,  redness and pain at the surgery site, drainage, fever, or pain not relieved by medication.    4) Additional Instructions:        Please contact your physician with any  problems or Same Day Surgery at [phone], Monday through Friday 6 am to 4 pm, or [place] at [facility] number at [phone].    AMBULATORY SURGERY  DISCHARGE INSTRUCTIONS   5) The drugs that you were given will stay in your system until tomorrow so for the next 24 hours you should not:  D) Drive an automobile E) Make any legal decisions F) Drink any alcoholic beverage   6) You may resume regular meals tomorrow.  Today it is better to start with liquids and gradually work up to solid foods.  You may eat anything you prefer, but it is better to start with liquids, then soup and crackers, and gradually work up to solid foods.   7) Please notify your doctor immediately if you have any unusual bleeding, trouble breathing, redness and pain at the surgery site, drainage, fever, or pain not relieved by medication.    8) Additional Instructions:        Please contact your physician with any problems or Same Day Surgery at [phone], Monday through Friday 6 am to 4 pm, or  at [facility] number at [phone].

## 2018-06-18 NOTE — Transfer of Care (Signed)
Immediate Anesthesia Transfer of Care Note  Patient: Karen Edwards  Procedure(s) Performed: LUMBAR SPINAL CORD STIMULATOR INSERTION (N/A )  Patient Location: PACU  Anesthesia Type:General  Level of Consciousness: awake  Airway & Oxygen Therapy: Patient Spontanous Breathing and Patient connected to face mask oxygen  Post-op Assessment: Report given to RN and Post -op Vital signs reviewed and stable  Post vital signs: Reviewed  Last Vitals:  Vitals Value Taken Time  BP 122/66 06/18/2018  3:38 PM  Temp    Pulse 54 06/18/2018  3:38 PM  Resp 13 06/18/2018  3:38 PM  SpO2 100 % 06/18/2018  3:38 PM  Vitals shown include unvalidated device data.  Last Pain:  Vitals:   06/18/18 1203  TempSrc: Oral  PainSc: 5          Complications: No apparent anesthesia complications

## 2018-06-18 NOTE — Brief Op Note (Addendum)
Date: 06/18/2018  Time: 3:30 PM  Patient: Karen Edwards  (52 y.o. female) Diagnosis Pre-op: BILATERAL LOW BACK PLAIN,LUMBAR RADICULOTHAPY Post-op: BILATERAL LOW BACK PLAIN,LUMBAR RADICULOTHAPY Procedure: Colonoscopy With Propofol and Esophagogastroduodenoscopy (egd) With Propofol Operating Room: [facility] OR ROOM 04  Surgeon: Milinda Pointer, MD  Assistant(s): None  Anesthesia: Monitor Anesthesia Care  Anesthesia Staff:  Anesthesiologist: Alvin Critchley, MD; Piscitello, Precious Haws, MD CRNA: Rolla Plate, CRNA Student Nurse Anesthetist: Marin Shutter, RN EBL: minimal  Blood Administered: None Drains: None Meds ordered this encounter  Medications  . ceFAZolin (ANCEF) IVPB 1 g/50 mL premix    Order Specific Question:   Antibiotic Indication:    Answer:   Surgical Prophylaxis    Order Specific Question:   Other Indication:    Answer:   Procedure Prophylaxis / Mitral Valve Prolapse  . Chlorhexidine Gluconate Cloth 2 % PADS 6 each  . vancomycin (VANCOCIN) IVPB 1000 mg/200 mL premix    Order Specific Question:   Indication:    Answer:   Surgical Prophylaxis  . famotidine (PEPCID) 20 MG tablet    [place]: cabinet override  . ceFAZolin (ANCEF) 1-4 GM/50ML-% IVPB    Riguera, Joellyn Rued: cabinet override  . vancomycin (VANCOCIN) 1-5 GM/200ML-% IVPB    [place]: cabinet override  . lactated ringers infusion  . famotidine (PEPCID) tablet 20 mg  . Marcaine 0.5% 30 mL with lidocaine 1% 30 mL injection  . sodium chloride 0.9 % injection  . hydrogen peroxide 3 % external solution   Specimen & disposition: * No specimens in log * Complete Count: YES Tourniquet: * No tourniquets in log *  Dictation: Note written in EPIC Plan of Care: Discharge to 01-Home or Self Care after PACU  Patient Disposition: F/U at [facility] Clinic as outpatient. Implants    Lead   Kit Lead - [id number] - Implanted  Back    Inventory item: KIT LEAD Model/Cat number: [id number]   Manufacturer: [place] Lot number: [id number]   Area Of Implantation: Back      As of 06/18/2018    Status: Implanted             Kit Lead - [id number] - Implanted  Back    Inventory item: KIT LEAD Model/Cat number: [id number]   Manufacturer: [place] Lot number: [id number]   Area Of Implantation: Back      As of 06/18/2018    Status: Implanted             Neuro Prosthesis/Implant   Device Implant Neurostiminator - [id number] - Implanted  Back    Inventory item: DEVICE IMPLANT NEUROSTIMINATOR Model/Cat number: [id number]   Serial number: [id number] Manufacturer: [employer] [place]   As of 06/18/2018    Status: Implanted

## 2018-06-18 NOTE — Anesthesia Post-op Follow-up Note (Signed)
Anesthesia QCDR form completed.        

## 2018-06-18 NOTE — Interval H&P Note (Signed)
Patient's Name: Karen Edwards  MRN: [id number]  Referring Provider: No ref. provider found  DOB: Dec 31, 1965  PCP: Pleas Koch, NP  DOS: 06/08/2018  Note by: Gaspar Cola, MD  Service setting: Outpatient Same Day Surgery  Specialty: Interventional Pain Management  Patient type: Established  Location: [facility]  Encounter type: Pre-operative Evaluation   Admission Date & Time: 06/18/2018 11:25 AM  History and Physical Interval Note:  [name] has presented today for surgery, with the diagnosis of : DDD (degenerative disc disease), lumbar.  I have reviewed the patient's chart, labs, images, history and physical exam. No new additional findings. No change in status. Karen Edwards is stable for surgery. The various alternatives of treatment have been discussed with the patient and family. The patient has been informed of the risks and possible complications. After consideration of risks, benefits and other options for treatment, the patient has consented to Procedure(s): LUMBAR SPINAL CORD STIMULATOR INSERTION: , as a surgical intervention . All questions have been answered to the patient's satisfaction.  Plan: Proceed with surgery as planned.  OR Scheduled Time: 1245  Note by: Gaspar Cola, MD Date: 06/08/2018; Time: 1:23 PM

## 2018-06-18 NOTE — Anesthesia Preprocedure Evaluation (Signed)
Anesthesia Evaluation  Patient identified by MRN, date of birth, ID band Patient awake    Reviewed: Allergy & Precautions, H&P , NPO status , Patient's Chart, lab work & pertinent test results  History of Anesthesia Complications Negative for: history of anesthetic complications  Airway Mallampati: III  TM Distance: <3 FB Neck ROM: limited    Dental  (+) Chipped, Poor Dentition, Missing   Pulmonary shortness of breath and with exertion, COPD, Current Smoker,           Cardiovascular Exercise Tolerance: Good negative cardio ROS       Neuro/Psych PSYCHIATRIC DISORDERS Anxiety Depression  Neuromuscular disease negative psych ROS   GI/Hepatic Neg liver ROS, GERD  Medicated and Controlled,  Endo/Other  negative endocrine ROS  Renal/GU      Musculoskeletal  (+) Arthritis ,   Abdominal   Peds  Hematology negative hematology ROS (+)   Anesthesia Other Findings Past Medical History: No date: Anxiety No date: Chronic back pain No date: Degenerative joint disease No date: Depression No date: Dyspnea No date: Hypotension 05/2015: IBS (irritable bowel syndrome) No date: MVP (mitral valve prolapse)  Past Surgical History: 03/02/2014: ANKLE SURGERY; Right     Comment:  repair of tendon and sural nerve right ankle 02/21/2016: CERVICAL SPINE SURGERY     Comment:  arthrodesis of anterior cervical spine (fusion of               C5-6,C6-7 and insertion of bone allograft No date: CESAREAN SECTION 1990: CESAREAN SECTION WITH BILATERAL TUBAL LIGATION 04/19/2015: COLONOSCOPY 05/18/2018: COLONOSCOPY WITH PROPOFOL; N/A     Comment:  Procedure: COLONOSCOPY WITH PROPOFOL;  Surgeon: Lin Landsman, MD;  Location: [facility] ENDOSCOPY;  Service:               Gastroenterology;  Laterality: N/A; 05/18/2018: ESOPHAGOGASTRODUODENOSCOPY (EGD) WITH PROPOFOL; N/A     Comment:  Procedure: ESOPHAGOGASTRODUODENOSCOPY (EGD) WITH                PROPOFOL;  Surgeon: Lin Landsman, MD;  Location:               [facility] ENDOSCOPY;  Service: Gastroenterology;  Laterality:               N/A; 06/16/2015: HEMORRHOID SURGERY; N/A     Comment:  Procedure: HEMORRHOIDECTOMY;  Surgeon: Leonie Green, MD;  Location: [facility] ORS;  Service: General;                Laterality: N/A; 12/24/2013: HYSTEROSCOPY W/D&C  BMI    Body Mass Index:  26.13 kg/m      Reproductive/Obstetrics negative OB ROS                             Anesthesia Physical Anesthesia Plan  ASA: III  Anesthesia Plan: MAC   Post-op Pain Management:    Induction: Intravenous  PONV Risk Score and Plan:   Airway Management Planned: Natural Airway and Nasal Cannula  Additional Equipment:   Intra-op Plan:   Post-operative Plan:   Informed Consent: I have reviewed the patients History and Physical, chart, labs and discussed the procedure including the risks, benefits and alternatives for the proposed anesthesia with the patient or authorized representative who has indicated his/her understanding and acceptance.  Dental Advisory Given  Plan Discussed with: Anesthesiologist, CRNA and Surgeon  Anesthesia Plan Comments: (Patient consented for risks of anesthesia including but not limited to:  - adverse reactions to medications - damage to teeth, lips or other oral mucosa - sore throat or hoarseness - Damage to heart, brain, lungs or loss of life  Patient voiced understanding.)        Anesthesia Quick Evaluation

## 2018-06-18 NOTE — OR Nursing (Signed)
Discharge instructions discussed with pt and husband. Both voice understanding. 

## 2018-06-19 ENCOUNTER — Encounter: Payer: Self-pay | Admitting: Pain Medicine

## 2018-06-19 NOTE — Anesthesia Postprocedure Evaluation (Signed)
Anesthesia Post Note  Patient: Karen Edwards  Procedure(s) Performed: LUMBAR SPINAL CORD STIMULATOR INSERTION (N/A )  Patient location during evaluation: PACU Anesthesia Type: General Level of consciousness: awake and alert and oriented Pain management: pain level controlled Vital Signs Assessment: post-procedure vital signs reviewed and stable Respiratory status: spontaneous breathing Cardiovascular status: blood pressure returned to baseline Anesthetic complications: no     Last Vitals:  Vitals:   06/18/18 1701 06/18/18 1715  BP:  126/60  Pulse: (!) 54 62  Resp: 13 15  Temp: 36.6 C 36.6 C  SpO2: 97% 98%    Last Pain:  Vitals:   06/18/18 1715  TempSrc:   PainSc: 0-No pain                 Everard Interrante

## 2018-06-19 NOTE — Op Note (Signed)
Patient's Name: Karen Edwards  MRN: [id number]  Referring Provider: No ref. provider found  DOB: 26-Mar-1966  PCP: Pleas Koch, NP  DOS: 06/18/2018  Surgeon: Gaspar Cola, MD  Service setting: Outpatient Same Day Surgery  Specialty: Interventional Pain Management  Patient type: Established  Location: [facility] [facility] OR ROOM 04  Visit type: Surgical Intervention   Primary Reason for Admission: Surgical management of chronic pain condition.  Surgical Procedure #1  Anesthesia, Analgesia, Anxiolysis:  Type: Spinal Cord Neurostimulator IPG (Implantable Pulse Generator. AKA: battery) Implant Region: Lumbosacral area IPG Level: Buttocks, below and lateral to PSIS, below patient's belt-line. Area selected due to estimated location between seat and backrest on a chair, durning normal sitting position. IPG Laterality: Left-sided  Type: MAC (General) Staff:  Anesthesiologist: Alvin Critchley, MD; Piscitello, Precious Haws, MD CRNA: Rolla Plate, CRNA Student Nurse Anesthetist: Marin Shutter, RN  Sedation: Meaningful verbal contact was maintained during the critical portions of the procedure  Local/Regional Analgesia: Local anesthetic infiltration by Surgeon Gaspar Cola, MD) Local Anesthetic: Bupivacaine 0.5% + Lidocaine 1.0% in a 50:50 Mix Indication(s): Analgesia   Surgical Procedure #2    Surgery: Percutaneous, Posterior Epidural Neurostimulator Lead(s)  Implant w/o laminectomy/laminotomy Type: Dual Permanent Lead Implant Region: Lumbar Insertion Level:  T11-12 Top Electrode Final Resting Area: T8 Laterality: Bilateral     Position: Prone Target Area: Posterior Epidural Space, as close to midline as anatomically possible. Approach: Surgical Area Prepped: Entire Posterior Thoracolumbar Region Prepping solution: ChloraPrep (2% chlorhexidine gluconate and 70% isopropyl alcohol)   Indications: 1. BILATERAL LOW BACK PLAIN,LUMBAR RADICULOTHAPY   Pain Score: Pre-procedure: 5  /10 Post-procedure: 2 /10  Pre-op Assessment:  Karen Edwards is a 52 y.o. (year old), female patient, seen today for interventional treatment. She  has a past surgical history that includes Cesarean section; Ankle surgery (Right, 03/02/2014); Hemorrhoid surgery (N/A, 06/16/2015); Cervical spine surgery (02/21/2016); Cesarean section with bilateral tubal ligation (1990); Colonoscopy (04/19/2015); Hysteroscopy w/D&C (12/24/2013); Colonoscopy with propofol (N/A, 05/18/2018); Esophagogastroduodenoscopy (egd) with propofol (N/A, 05/18/2018); and Spinal cord stimulator insertion (N/A, 06/18/2018).  Initial Vital Signs:  Pulse/EKG Rate: (!) 57ECG Heart Rate: (!) 58 Temp: 98.7 F (37.1 C) Resp: 20 BP: 132/67 SpO2: 98 %  BMI: Estimated body mass index is 26.13 kg/m as calculated from the following:   Height as of this encounter: _0  (1.549 m).   Weight as of this encounter: 62.7 kg.  Risk Assessment: Allergies: Reviewed. She is allergic to no known allergies.  Allergy Precautions: None required Coagulopathies: Reviewed. None identified.  Blood-thinner therapy: None at this time Active Infection(s): Reviewed. None identified. Karen Edwards is afebrile  Site Confirmation: Karen Edwards was asked to confirm the procedure and laterality before marking the site, which she did. Procedure checklist: Completed Consent: Before the procedure and under the influence of no sedative(s), amnesic(s), or anxiolytics, the patient was informed of the treatment options, risks and possible complications. To fulfill our ethical and legal obligations, as recommended by the American Medical Association's Code of Ethics, I have informed the patient of my clinical impression; the nature and purpose of the treatment or procedure; the risks, benefits, and possible complications of the intervention; the alternatives, including doing nothing; the risk(s) and benefit(s) of the alternative treatment(s) or procedure(s); and the  risk(s) and benefit(s) of doing nothing.  Karen Edwards was provided with information about the general risks and possible complications associated with most interventional procedures. These include, but are not limited to: failure to achieve desired goals, infection,  bleeding, organ or nerve damage, allergic reactions, paralysis, and/or death.  In addition, she was informed of those risks and possible complications associated to this particular procedure, which include, but are not limited to: damage to the implant; failure to decrease pain; local, systemic, or serious CNS infections, intraspinal abscess with possible cord compression and paralysis, or life-threatening such as meningitis; intrathecal and/or epidural bleeding with formation of hematoma with possible spinal cord compression and permanent paralysis; organ damage; nerve injury or damage with subsequent sensory, motor, and/or autonomic system dysfunction, resulting in transient or permanent pain, numbness, and/or weakness of one or several areas of the body; allergic reactions, either minor or major life-threatening, such as anaphylactic or anaphylactoid reactions.  Furthermore, Karen Edwards was informed of those risks and complications associated with the medications. These include, but are not limited to: allergic reactions (i.e.: anaphylactic or anaphylactoid reactions); arrhythmia;  Hypotension/hypertension; cardiovascular collapse; respiratory depression and/or shortness of breath; swelling or edema; medication-induced neural toxicity; particulate matter embolism and blood vessel occlusion with resultant organ, and/or nervous system infarction and permanent paralysis.  Finally, she was informed that Medicine is not an exact science; therefore, there is also the possibility of unforeseen or unpredictable risks and/or possible complications that may result in a catastrophic outcome. The patient indicated having understood very clearly. We have  given the patient no guarantees and we have made no promises. Enough time was given to the patient to ask questions, all of which were answered to the patient's satisfaction. Karen Edwards has indicated that she wanted to continue with the procedure. Attestation: I, the ordering provider, attest that I have discussed with the patient the benefits, risks, side-effects, alternatives, likelihood of achieving goals, and potential problems during recovery for the procedure that I have provided informed consent. Date  Time: 06/18/2018 11:25 AM   Pre-Procedure Preparation:  Monitoring: As per Anesthesia protocol. Respiration, ETCO2, SpO2, BP, heart rate and rhythm monitor placed and checked for adequate function  Safety Precautions: Patient was assessed for positional comfort and pressure points before starting the procedure.  Time-out: I initiated and conducted the "Time-out" before starting the procedure, as per protocol. The patient was asked to participate by confirming the accuracy of the "Time Out" information. Verification of the correct person, site, and procedure were performed and confirmed by me, the nursing staff, and the patient. "Time-out" conducted as per Joint Commission's Universal Protocol (UP.01.01.01).  Description of Procedure:   Safety Precautions: Aspiration looking for blood return was conducted prior to all injections. At no point did we inject any substances, as a needle was being advanced. No attempts were made at seeking any paresthesias. Safe injection practices and needle disposal techniques used. Medications properly checked for expiration dates. SDV (single dose vial) medications used. Description of the procedure: The procedure site was prepped using a broad-spectrum topical antiseptic. The area was then draped in the usual and standard manner. "Time-out" was performed as per JC Universal Protocol (UP.01.01.01).  Neurostimulator implant: Availability of a responsible, adult  driver, and NPO status confirmed. Informed consent was obtained after having discussed risks and possible complications with the patient, including, but not limited to, infection, bleeding, nerve damage, paralysis, and death. Prior to the procedure date, written information had been given to the patient, on the procedure, as well as its risks and possible complications. Ample time was given to the patient to ask questions, all of which were answered, to the patient's satisfaction. Baseline vital signs were taken and the Nursing and Anesthesia assessments  were completed. An IV was started. The patient was then taken to the designated operating room ([place] 04), where the patient was placed in position for the surgery, over the fluoroscopy-compatible surgical table. Ms. Magee was monitored as per Anesthesia standard protocols, using NIBPM, ECG, and pulse oxymetry. The procedure area on the patient was prepped using a broad-spectrum topical antiseptic. The area was then draped in the usual and standard manner. Meaningful verbal contact was maintained with the patient at all times.  Fluoroscopy was manipulated, using "Tunnel Vision Technique", to obtain the best possible view of the target area. Parallex error was corrected before commencing the procedure. Timeout was completed. Once a clear view of the target had been obtained, the skin and deeper tissues over the procedure site were infiltrated using a 50:50 mix of lidocaine:bupivacaine, loaded in 10 cc luer-loc syringes with a 1.5-inch, 25-G needle. A midline incision was made over the area where needle placement was planned. Hemostasis w/ cautery attained. The epidural needle was inserted under live fluoroscopic guidance. No attempt was made at seeking any paresthesias. The paramidline approach was used to enter the posterior epidural space at a 30 angle, using "Loss-of-resistance Technique" with 3 ml of PF-NaCl (0.9% NSS) + 0.5 cc of air, in a 5cc glass  syringe, using a "loss-of-resistance technique". Correct needle placement was confirmed in the antero-posterior and lateral fluoroscopic views. With Ms. Toure able to provide feedback, the neurostimulator lead was gently introduced under real-time fluoroscopy, constantly asking the patient for pain or paresthesias. The upper electrode was gently guided to the target level. Once archived, antero-posterior and lateral fluoroscopic views were taken to confirm needle placement in two planes. Electrode placement was then tested until a comfortable stimulation pattern was achieved over the desired pain distribution. Once stimulation was in the correct area, the epidural needle(s) were removed under live fluoroscopic guidance to avoid lead migration. Supraspinal ligament lead anchoring was done using silk suture and the anchors provided with the implant kit. At this point, having completed the lead placement , testing, and anchoring, Ms. Hilgert was provided with additional sedation, and the skin over the IPG implant site was infiltrated using the same lidocaine:bupivacaine mix. Once the area was properly anesthetized and Ms. Glaeser sedated, the IPG pocket site was created using the Bovie cautery for hemostasis. The lead(s) were tunneled and connected to the IPG. Proper connection was assessed using impedance checks. Once proper hemostasis was confirmed, both wounds were closed with vicryl 2-0 after cleaning them with a solution containing 50:50 hydrogen peroxide and betadine. Surgical staples were used to close the skin. The wounds were covered with sterile transparent bio-occlusive dressings, to easily assess any evidence of infection in the future. Ms. Ruddy was then transported to the PACU area for recovery and implant programming. An abdominal binder was then ordered to assist in preventing seromas, and to remind Ms. Mcconathy to follow the written recommendations on post-op movement limitations.  EBL: 10  mL  Vitals:   06/18/18 1637 06/18/18 1652 06/18/18 1701 06/18/18 1715  BP: 116/60 113/64  126/60  Pulse: (!) 49 (!) 46 (!) 54 62  Resp: _0 Temp:   97.8 F (36.6 C) 97.8 F (36.6 C)  TempSrc:      SpO2: 91% 93% 97% 98%  Weight:      Height:         Start Time:  1:00 PM  Time to End: 1 Hr 58 Min 23 Sec  Neurostimulator Implant  Details:   Implants    Lead   Kit Lead - [id number] - Implanted  Back    Inventory item: KIT LEAD Model/Cat number: [id number]   Manufacturer: [employer] Lot number: [id number]   Area Of Implantation: Back      As of 06/18/2018    Status: Implanted             Kit Lead - [id number] - Implanted  Back    Inventory item: KIT LEAD Model/Cat number: [id number]   Manufacturer: [place] Lot number: [id number]   Area Of Implantation: Back      As of 06/18/2018    Status: Implanted             Neuro Prosthesis/Implant   Device Implant Neurostiminator - [id number] - Implanted  Back    Inventory item: DEVICE IMPLANT NEUROSTIMINATOR Model/Cat number: [id number]   Serial number: [id number] Manufacturer: [employer]   As of 06/18/2018    Status: Implanted               Generator(Battery):  Brand: Medtronic Location: Upper buttocks area, lateral to PSIS Laterality:  Left MRI compatibility: Yes Recharging capability: Yes  Lead(s):  Brand: Medtronic Epidural Access Level:  T11-12 T11-12  Lead implant:  Bilateral   No. of Electrodes/Lead:  8           Laterality:  Right Left  Top electrode location:  T8 T8  Bottom electrode location:  L1-2 L1-2  MRI compatibility:  Yes Yes       Visiting Personnel:  Visitor: Visitor - Tiffany  - Comments: X-ray   Imaging Guidance (Spinal):          Type of Imaging Technique: Fluoroscopy Guidance (Spinal) Indication(s): Assistance in needle guidance and placement for procedures requiring needle placement in or near specific anatomical locations not easily accessible  without such assistance. Exposure Time: Please see nurses notes. Contrast: None used.         Fluoroscopic Guidance: I was personally present during the use of fluoroscopy. "Tunnel Vision Technique" used to obtain the best possible view of the target area. Parallax error corrected before commencing the procedure. "Direction-depth-direction" technique used to introduce the needle under continuous pulsed fluoroscopy. Once target was reached, antero-posterior, oblique, and lateral fluoroscopic projection used confirm needle placement in all planes. Images permanently stored in EMR. Interpretation: No contrast injected. I personally interpreted the imaging intraoperatively. Adequate needle placement confirmed in multiple planes. Permanent images saved into the patient's record.  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  Post-operative Assessment:  Post-procedure Vital Signs:  Pulse/HCG Rate: 6262 Temp: 97.8 F (36.6 C) Resp: 15 BP: 126/60 SpO2: 98 %  Complications: No immediate post-treatment complications observed by team, or reported by patient.  Note: The patient tolerated the entire procedure well. A repeat set of vitals were taken after the procedure and the patient was kept under observation following institutional policy, for this type of procedure. Post-procedural neurological assessment was performed, showing return to baseline, prior to discharge. The patient was provided with post-procedure discharge instructions, including a section on how to identify potential problems. Should any problems arise concerning this procedure,  the patient was given instructions to immediately contact us, at any  time, without hesitation. In any case, we plan to contact the patient by telephone for a follow-up status report regarding this interventional procedure.  Comments:  No additional relevant information.  Post-op Plan of Care  Disposition: Discharge home under the care of a responsible, capable, adult driver. Return to clinics in 6-10 days for post-procedure evaluation and removal of staples.  Discharge Date & Time: 06/18/2018  5:36 PM  Follow-up:  Future Appointments  Date Time Provider [place]  06/30/2018  3:00 PM Milinda Pointer, MD [facility] None  07/01/2018  8:00 AM [facility]  07/01/2018  9:00 AM [facility]  07/13/2018  2:15 PM Vanga, Tally Due, MD [facility] None   Primary Care Physician: Pleas Koch, NP Location: [facility]  Note by: Gaspar Cola, MD Date: 06/08/2018; Time: 11:22 AM

## 2018-06-30 ENCOUNTER — Ambulatory Visit: Payer: PPO | Attending: Pain Medicine | Admitting: Pain Medicine

## 2018-06-30 ENCOUNTER — Encounter: Payer: Self-pay | Admitting: Pain Medicine

## 2018-06-30 VITALS — BP 112/66 | HR 76 | Temp 99.2°F | Resp 16 | Ht 61.0 in | Wt 140.0 lb

## 2018-06-30 DIAGNOSIS — M5030 Other cervical disc degeneration, unspecified cervical region: Secondary | ICD-10-CM | POA: Insufficient documentation

## 2018-06-30 DIAGNOSIS — F329 Major depressive disorder, single episode, unspecified: Secondary | ICD-10-CM | POA: Insufficient documentation

## 2018-06-30 DIAGNOSIS — M25552 Pain in left hip: Secondary | ICD-10-CM | POA: Insufficient documentation

## 2018-06-30 DIAGNOSIS — M8938 Hypertrophy of bone, other site: Secondary | ICD-10-CM | POA: Diagnosis not present

## 2018-06-30 DIAGNOSIS — Z8249 Family history of ischemic heart disease and other diseases of the circulatory system: Secondary | ICD-10-CM | POA: Insufficient documentation

## 2018-06-30 DIAGNOSIS — M5442 Lumbago with sciatica, left side: Secondary | ICD-10-CM | POA: Diagnosis not present

## 2018-06-30 DIAGNOSIS — M47812 Spondylosis without myelopathy or radiculopathy, cervical region: Secondary | ICD-10-CM

## 2018-06-30 DIAGNOSIS — G4700 Insomnia, unspecified: Secondary | ICD-10-CM | POA: Insufficient documentation

## 2018-06-30 DIAGNOSIS — M47892 Other spondylosis, cervical region: Secondary | ICD-10-CM | POA: Insufficient documentation

## 2018-06-30 DIAGNOSIS — M5441 Lumbago with sciatica, right side: Secondary | ICD-10-CM | POA: Diagnosis not present

## 2018-06-30 DIAGNOSIS — M25511 Pain in right shoulder: Secondary | ICD-10-CM | POA: Diagnosis not present

## 2018-06-30 DIAGNOSIS — Z79891 Long term (current) use of opiate analgesic: Secondary | ICD-10-CM | POA: Insufficient documentation

## 2018-06-30 DIAGNOSIS — M79601 Pain in right arm: Secondary | ICD-10-CM | POA: Insufficient documentation

## 2018-06-30 DIAGNOSIS — E785 Hyperlipidemia, unspecified: Secondary | ICD-10-CM | POA: Insufficient documentation

## 2018-06-30 DIAGNOSIS — F17210 Nicotine dependence, cigarettes, uncomplicated: Secondary | ICD-10-CM | POA: Diagnosis not present

## 2018-06-30 DIAGNOSIS — M542 Cervicalgia: Secondary | ICD-10-CM | POA: Diagnosis not present

## 2018-06-30 DIAGNOSIS — M5136 Other intervertebral disc degeneration, lumbar region: Secondary | ICD-10-CM | POA: Insufficient documentation

## 2018-06-30 DIAGNOSIS — G8929 Other chronic pain: Secondary | ICD-10-CM | POA: Diagnosis not present

## 2018-06-30 DIAGNOSIS — F419 Anxiety disorder, unspecified: Secondary | ICD-10-CM | POA: Insufficient documentation

## 2018-06-30 DIAGNOSIS — M5481 Occipital neuralgia: Secondary | ICD-10-CM | POA: Insufficient documentation

## 2018-06-30 DIAGNOSIS — Z9889 Other specified postprocedural states: Secondary | ICD-10-CM | POA: Diagnosis not present

## 2018-06-30 DIAGNOSIS — M79605 Pain in left leg: Secondary | ICD-10-CM | POA: Insufficient documentation

## 2018-06-30 DIAGNOSIS — Z9689 Presence of other specified functional implants: Secondary | ICD-10-CM

## 2018-06-30 DIAGNOSIS — G894 Chronic pain syndrome: Secondary | ICD-10-CM | POA: Diagnosis not present

## 2018-06-30 DIAGNOSIS — K581 Irritable bowel syndrome with constipation: Secondary | ICD-10-CM | POA: Diagnosis not present

## 2018-06-30 DIAGNOSIS — M5116 Intervertebral disc disorders with radiculopathy, lumbar region: Secondary | ICD-10-CM | POA: Insufficient documentation

## 2018-06-30 DIAGNOSIS — Z5181 Encounter for therapeutic drug level monitoring: Secondary | ICD-10-CM | POA: Diagnosis not present

## 2018-06-30 DIAGNOSIS — M79602 Pain in left arm: Secondary | ICD-10-CM | POA: Diagnosis not present

## 2018-06-30 DIAGNOSIS — M4802 Spinal stenosis, cervical region: Secondary | ICD-10-CM | POA: Insufficient documentation

## 2018-06-30 DIAGNOSIS — M25512 Pain in left shoulder: Secondary | ICD-10-CM | POA: Insufficient documentation

## 2018-06-30 DIAGNOSIS — Z79899 Other long term (current) drug therapy: Secondary | ICD-10-CM | POA: Insufficient documentation

## 2018-06-30 DIAGNOSIS — M533 Sacrococcygeal disorders, not elsewhere classified: Secondary | ICD-10-CM | POA: Diagnosis not present

## 2018-06-30 DIAGNOSIS — M79604 Pain in right leg: Secondary | ICD-10-CM | POA: Insufficient documentation

## 2018-06-30 DIAGNOSIS — M25551 Pain in right hip: Secondary | ICD-10-CM | POA: Insufficient documentation

## 2018-06-30 DIAGNOSIS — I959 Hypotension, unspecified: Secondary | ICD-10-CM | POA: Insufficient documentation

## 2018-06-30 DIAGNOSIS — M5137 Other intervertebral disc degeneration, lumbosacral region: Secondary | ICD-10-CM | POA: Diagnosis not present

## 2018-06-30 DIAGNOSIS — K219 Gastro-esophageal reflux disease without esophagitis: Secondary | ICD-10-CM | POA: Insufficient documentation

## 2018-06-30 NOTE — Progress Notes (Signed)
Patient's Name: Karen Edwards  MRN: [id number]  Referring Provider: Pleas Koch, NP  DOB: [birth year]  PCP: Pleas Koch, NP  DOS: 06/30/2018  Surgeon: Gaspar Cola, MD  Service setting: Ambulatory outpatient  Specialty: Interventional Pain Management  Patient type: Established  Location: [facility] (AMB) Pain Management Facility  Visit type: Post-operative Evaluation   Primary Reason(s) for Visit: Encounter for post-procedure evaluation of chronic illness with mild to moderate exacerbation CC: Back Pain (lower bilateral ) and Neck Pain  HPI  [name] is a 52 y.o. year old, female patient, who comes today for a post-procedure evaluation. She has Chronic low back pain (Primary Area of Pain) (Bilateral) (R>L); Menorrhagia; Fatigue; DDD (degenerative disc disease), lumbar; DDD (degenerative disc disease), cervical; Chronic occipital neuralgia (Fifth Area of Pain) (Bilateral) (R>L); Lumbar facet syndrome (Bilateral) (L>R); DDD (degenerative disc disease), lumbosacral; Lumbar radicular pain (Right) (L4); Sacroiliac joint dysfunction (Bilateral); Depression; Hemorrhoids; Insomnia; Chronic constipation; Pharmacologic therapy; Hyperlipidemia, unspecified; Chronic neck pain (Fourth Area of Pain) (Bilateral) (R>L); GERD (gastroesophageal reflux disease); Numbness and tingling of upper extremity (Right); Chronic upper extremity pain (Bilateral) (R>L); Chronic sacroiliac joint pain (Bilateral) (L>R); Lumbar facet hypertrophy (Bilateral); L1-2 disc extrusion (Right); Cervical foraminal stenosis (C5-6) (Bilateral); Long term prescription opiate use; Opiate use; Disorder of skeletal system; Problems influencing health status; Chronic pain syndrome; Chronic cervical radicular pain (Right: C6/C7) (Left: C5/T1); Chronic lumbar radicular pain; Chronic hip pain (Tertiary Area of Pain) (Bilateral) (R>L); Chronic lower extremity pain (Secondary Area of Pain) (Bilateral) (R>L); Chronic shoulder pain (Bilateral)  (L>R); Chronic musculoskeletal pain; Lumbar spondylosis; Stress incontinence of urine; Cervical facet syndrome; Myofascial pain; Spondylosis without myelopathy or radiculopathy, cervical region; Cervicalgia; Nausea and vomiting; History of colonic polyps; S/P insertion of Epidural Neurostimulator (SCS) (Bilateral); and MVP (mitral valve prolapse) on their problem list. Her primarily concern today is the Back Pain (lower bilateral ) and Neck Pain  Pain Assessment: Location: Left, Right, Lower Back Radiating: denies  Onset: More than a month ago Duration: Chronic pain Quality: Discomfort, Aching, Burning, Sharp(sharp pain at battery pack site that is brought on by position change ) Severity: 1 /10 (subjective, self-reported pain score)  Note: Reported level is compatible with observation.                         When using our objective Pain Scale, levels between 6 and 10/10 are said to belong in an emergency room, as it progressively worsens from a 6/10, described as severely limiting, requiring emergency care not usually available at an outpatient pain management facility. At a 6/10 level, communication becomes difficult and requires great effort. Assistance to reach the emergency department may be required. Facial flushing and profuse sweating along with potentially dangerous increases in heart rate and blood pressure will be evident. Effect on ADL: able to do more activity with stimulator in place  Timing: Constant Modifying factors: stimulator  BP: 112/66  HR: 76  [name] comes in today for post-op evaluation after Lumbar Spinal Cord Stimulator Insertion done on 06/18/2018.  Further details on both, my assessment(s), as well as the proposed treatment plan, please see below.  Post-operative Assessment  Intra-procedural problems/complications: None observed.         Reported side-effects: None.        Post-surgical adverse reactions or complications: None reported         Laboratory  Chemistry  Hematology Lab Results  Component Value Date   WBC 9.0 05/20/2017  RBC 4.33 05/20/2017   HGB 13.5 05/20/2017   HCT 40.5 05/20/2017   PLT 202.0 05/20/2017                         Note: No apparent post-operative complications.  Recent Diagnostic Imaging Results  DG C-Arm 1-60 Min-No Report Fluoroscopy was utilized by the requesting physician.  No radiographic  interpretation.   Complexity Note: Imaging results reviewed. Results shared with Ms. Tye Savoy, using [employer].                         Meds   Current Outpatient Medications:  .  atorvastatin (LIPITOR) 10 MG tablet, Take 1 tablet (10 mg total) by mouth daily. (Patient taking differently: Take 10 mg by mouth at bedtime. ), Disp: 90 tablet, Rfl: 3 .  Cyanocobalamin (B-12) 500 MCG TABS, Take 1,000 mcg by mouth daily., Disp: , Rfl:  .  FLUoxetine (PROZAC) 20 MG capsule, Take 3 capsules (60 mg total) by mouth daily., Disp: 270 capsule, Rfl: 3 .  HYDROcodone-acetaminophen (NORCO/VICODIN) 5-325 MG tablet, Take 1 tablet by mouth every 6 (six) hours as needed for up to 7 days for severe pain., Disp: 28 tablet, Rfl: 0 .  methocarbamol (ROBAXIN) 500 MG tablet, TAKE 1 TABLET BY MOUTH EVERY 8 HOURS AS NEEDED FOR MUSCLE SPASM (Patient taking differently: Take 1,000 mg by mouth at bedtime. ), Disp: 90 tablet, Rfl: 1 .  Potassium 99 MG TABS, Take 198 mg by mouth daily. , Disp: , Rfl:  .  traZODone (DESYREL) 100 MG tablet, Take 3 tablets (300 mg total) by mouth at bedtime., Disp: 270 tablet, Rfl: 3 .  HYDROcodone-acetaminophen (NORCO/VICODIN) 5-325 MG tablet, Take 1 tablet by mouth 2 (two) times daily. (Patient taking differently: Take 1 tablet by mouth 2 (two) times daily as needed (for pain.). ), Disp: 60 tablet, Rfl: 0 .  HYDROcodone-acetaminophen (NORCO/VICODIN) 5-325 MG tablet, Take 1 tablet by mouth every 6 (six) hours as needed for up to 7 days for severe pain., Disp: 28 tablet, Rfl: 0  ROS  Constitutional: Denies any  fever or chills Gastrointestinal: No reported hemesis, hematochezia, vomiting, or acute GI distress Musculoskeletal: Denies any acute onset joint swelling, redness, loss of ROM, or weakness Neurological: No reported episodes of acute onset apraxia, aphasia, dysarthria, agnosia, amnesia, paralysis, loss of coordination, or loss of consciousness  Allergies  Ms. Drennen is allergic to no known allergies.  [place]  Drug: Ms. Muldoon  reports that she does not use drugs. Alcohol:  reports that she drinks about 1.0 standard drinks of alcohol per week. Tobacco:  reports that she has been smoking cigarettes. She has a 18.50 pack-year smoking history. She has never used smokeless tobacco. Medical:  has a past medical history of Anxiety, Chronic back pain, Degenerative joint disease, Depression, Dyspnea, Hypotension, IBS (irritable bowel syndrome) (05/2015), and MVP (mitral valve prolapse). Surgical: Ms. Potvin  has a past surgical history that includes Cesarean section; Ankle surgery (Right, 03/02/2014); Hemorrhoid surgery (N/A, 06/16/2015); Cervical spine surgery (02/21/2016); Cesarean section with bilateral tubal ligation (1990); Colonoscopy (04/19/2015); Hysteroscopy w/D&C (12/24/2013); Colonoscopy with propofol (N/A, 05/18/2018); Esophagogastroduodenoscopy (egd) with propofol (N/A, 05/18/2018); and Spinal cord stimulator insertion (N/A, 06/18/2018). Family: family history includes Arthritis in her mother; Congestive Heart Failure in her maternal grandfather; Depression in her mother; Diabetes in her maternal aunt; Diabetes Mellitus I in her maternal grandmother; Gout in her maternal aunt; Hearing loss in her father; Heart  attack in her maternal grandfather; Hyperlipidemia in her father and mother; Hypertension in her father and mother; Hypothyroidism in her mother; Kidney disease in her mother; Prostate cancer in her paternal grandfather; Stroke in her maternal grandmother; Thyroid disease in her maternal aunt;  Vision loss in her mother.  Postop Exam  General appearance: Afebrile. Well nourished, well developed, and well hydrated. In no apparent acute distress. Vitals:   06/30/18 1447  BP: 112/66  Pulse: 76  Resp: 16  Temp: 99.2 F (37.3 C)  TempSrc: Oral  SpO2: 99%  Weight: 140 lb (63.5 kg)  Height: 5\' 1"  (1.549 m)   BMI Assessment: Estimated body mass index is 26.45 kg/m as calculated from the following:   Height as of this encounter: 5\' 1"  (1.549 m).   Weight as of this encounter: 140 lb (63.5 kg). Surgical site: Wound is healing well. No redness, tenderness, discharge, abnormal odors, or any other evidence of infection or complications.  Assessment  Primary Diagnosis & Pertinent Problem List: The primary encounter diagnosis was Chronic low back pain (Primary Area of Pain) (Bilateral) (R>L). Diagnoses of Chronic lower extremity pain (Secondary Area of Pain) (Bilateral) (R>L), DDD (degenerative disc disease), lumbar, S/P insertion of Epidural Neurostimulator (SCS) (Bilateral), Spondylosis without myelopathy or radiculopathy, cervical region, Cervical facet syndrome, and Cervicalgia were also pertinent to this visit.  Plan of Care  Today's Care: Today we removed the bandages and inspected the wound. The wound was cleaned, the surgical staples removed, steri-strips placed and wound covered.   Procedure Orders     CERVICAL FACET (MEDIAL BRANCH NERVE BLOCK)  Interventional management options: Planned, scheduled, and/or pending:   Diagnostic bilateral cervical facet block #3 under fluoroscopic guidance and IV sedation.   Considering:   Diagnostic bilateral lumbar facet block Possible bilateral lumbar facet RFA Diagnostic bilateral sacroiliac joint block Possible bilateral sacroiliac joint RFA Diagnostic bilateral intra-articular hip joint injection Diagnostic bilateral femoral nerve block + obturator nerve block Possible bilateral femoral nerve + obturator nerve  RFA Diagnostic right-sided L1-2lumbar epidural steroid injection Diagnostic right-sided L1-2transforaminal epidural steroid injection Diagnostic bilateralL4-5lumbar transforaminal epidural steroid injection Diagnostic bilateral cervical facet block Possible bilateral cervical facet RFA Diagnostic right-sided cervical epidural steroid injection Diagnostic bilateral greater occipital nerve block Possible bilateral occipital nerve RFA Diagnostic bilateral intra-articular shoulder joint injection Diagnostic bilateral suprascapular nerve block Possible bilateral suprascapular nerve RFA   Palliative PRN treatment(s):   Diagnostic bilateral cervical facet block #2  Diagnostic right-sided L1-2 interlaminar LESI+ right-sided L1 TFESI   Provider-requested follow-up: Return for Procedure (w/ sedation): (B) C-FCT BLK #3.  Future Appointments  Date Time Provider [place]  07/13/2018  2:15 PM Lin Landsman, MD [facility] None  07/16/2018 10:00 AM [facility]  07/16/2018 11:00 AM [facility]  07/23/2018  2:00 PM Milinda Pointer, MD [facility] None   Primary Care Physician: Pleas Koch, NP Location: [facility] Outpatient Pain Management Facility Note by: Gaspar Cola, MD Date: 06/30/2018; Time: 3:50 PM

## 2018-06-30 NOTE — Progress Notes (Deleted)
Suture clamps removed by k. Tice RN from middle back and left buttocks. Steri strips applied.

## 2018-06-30 NOTE — Patient Instructions (Addendum)
____________________________________________________________________________________________  Preparing for Procedure with Sedation  Instructions: . Oral Intake: Do not eat or drink anything for at least 8 hours prior to your procedure. . Transportation: Public transportation is not allowed. Bring an adult driver. The driver must be physically present in our waiting room before any procedure can be started. . Physical Assistance: Bring an adult physically capable of assisting you, in the event you need help. This adult should keep you company at home for at least 6 hours after the procedure. . Blood Pressure Medicine: Take your blood pressure medicine with a sip of water the morning of the procedure. . Blood thinners: Notify our staff if you are taking any blood thinners. Depending on which one you take, there will be specific instructions on how and when to stop it. . Diabetics on insulin: Notify the staff so that you can be scheduled 1st case in the morning. If your diabetes requires high dose insulin, take only  of your normal insulin dose the morning of the procedure and notify the staff that you have done so. . Preventing infections: Shower with an antibacterial soap the morning of your procedure. . Build-up your immune system: Take 1000 mg of Vitamin C with every meal (3 times a day) the day prior to your procedure. . Antibiotics: Inform the staff if you have a condition or reason that requires you to take antibiotics before dental procedures. . Pregnancy: If you are pregnant, call and cancel the procedure. . Sickness: If you have a cold, fever, or any active infections, call and cancel the procedure. . Arrival: You must be in the facility at least 30 minutes prior to your scheduled procedure. . Children: Do not bring children with you. . Dress appropriately: Bring dark clothing that you would not mind if they get stained. . Valuables: Do not bring any jewelry or valuables.  Procedure  appointments are reserved for interventional treatments only. . No Prescription Refills. . No medication changes will be discussed during procedure appointments. . No disability issues will be discussed.  Reasons to call and reschedule or cancel your procedure: (Following these recommendations will minimize the risk of a serious complication.) . Surgeries: Avoid having procedures within 2 weeks of any surgery. (Avoid for 2 weeks before or after any surgery). . Flu Shots: Avoid having procedures within 2 weeks of a flu shots or . (Avoid for 2 weeks before or after immunizations). . Barium: Avoid having a procedure within 7-10 days after having had a radiological study involving the use of radiological contrast. (Myelograms, Barium swallow or enema study). . Heart attacks: Avoid any elective procedures or surgeries for the initial 6 months after a "Myocardial Infarction" (Heart Attack). . Blood thinners: It is imperative that you stop these medications before procedures. Let us know if you if you take any blood thinner.  . Infection: Avoid procedures during or within two weeks of an infection (including chest colds or gastrointestinal problems). Symptoms associated with infections include: Localized redness, fever, chills, night sweats or profuse sweating, burning sensation when voiding, cough, congestion, stuffiness, runny nose, sore throat, diarrhea, nausea, vomiting, cold or Flu symptoms, recent or current infections. It is specially important if the infection is over the area that we intend to treat. . Heart and lung problems: Symptoms that may suggest an active cardiopulmonary problem include: cough, chest pain, breathing difficulties or shortness of breath, dizziness, ankle swelling, uncontrolled high or unusually low blood pressure, and/or palpitations. If you are experiencing any of these symptoms, cancel   your procedure and contact your primary care physician for an evaluation.  Remember:   Regular Business hours are:  Monday to Thursday 8:00 AM to 4:00 PM  Provider's Schedule: Milinda Pointer, MD:  Procedure days: Tuesday and Thursday 7:30 AM to 4:00 PM  Gillis Santa, MD:  Procedure days: Monday and Wednesday 7:30 AM to 4:00 PM ____________________________________________________________________________________________   Facet Blocks Patient Information  Description: The facets are joints in the spine between the vertebrae.  Like any joints in the body, facets can become irritated and painful.  Arthritis can also effect the facets.  By injecting steroids and local anesthetic in and around these joints, we can temporarily block the nerve supply to them.  Steroids act directly on irritated nerves and tissues to reduce selling and inflammation which often leads to decreased pain.  Facet blocks may be done anywhere along the spine from the neck to the low back depending upon the location of your pain.   After numbing the skin with local anesthetic (like Novocaine), a small needle is passed onto the facet joints under x-ray guidance.  You may experience a sensation of pressure while this is being done.  The entire block usually lasts about 15-25 minutes.   Conditions which may be treated by facet blocks:   Low back/buttock pain  Neck/shoulder pain  Certain types of headaches  Preparation for the injection:  1. Do not eat any solid food or dairy products within 8 hours of your appointment. 2. You may drink clear liquid up to 3 hours before appointment.  Clear liquids include water, black coffee, juice or soda.  No milk or cream please. 3. You may take your regular medication, including pain medications, with a sip of water before your appointment.  Diabetics should hold regular insulin (if taken separately) and take 1/2 normal NPH dose the morning of the procedure.  Carry some sugar containing items with you to your appointment. 4. A driver must accompany you and be  prepared to drive you home after your procedure. 5. Bring all your current medications with you. 6. An IV may be inserted and sedation may be given at the discretion of the physician. 7. A blood pressure cuff, EKG and other monitors will often be applied during the procedure.  Some patients may need to have extra oxygen administered for a short period. 8. You will be asked to provide medical information, including your allergies and medications, prior to the procedure.  We must know immediately if you are taking blood thinners (like Coumadin/Warfarin) or if you are allergic to IV iodine contrast (dye).  We must know if you could possible be pregnant.  Possible side-effects:   Bleeding from needle site  Infection (rare, may require surgery)  Nerve injury (rare)  Numbness & tingling (temporary)  Difficulty urinating (rare, temporary)  Spinal headache (a headache worse with upright posture)  Light-headedness (temporary)  Pain at injection site (serveral days)  Decreased blood pressure (rare, temporary)  Weakness in arm/leg (temporary)  Pressure sensation in back/neck (temporary)   Call if you experience:   Fever/chills associated with headache or increased back/neck pain  Headache worsened by an upright position  New onset, weakness or numbness of an extremity below the injection site  Hives or difficulty breathing (go to the emergency room)  Inflammation or drainage at the injection site(s)  Severe back/neck pain greater than usual  New symptoms which are concerning to you  Please note:  Although the local anesthetic injected can often make  make your back or neck feel good for several hours after the injection, the pain will likely return. It takes 3-7 days for steroids to work.  You may not notice any pain relief for at least one week.  If effective, we will often do a series of 2-3 injections spaced 3-6 weeks apart to maximally decrease your pain.  After the initial  series, you may be a candidate for a more permanent nerve block of the facets.  If you have any questions, please call [phone] [facility] 

## 2018-06-30 NOTE — Progress Notes (Signed)
Safety precautions to be maintained throughout the outpatient stay will include: orient to surroundings, keep bed in low position, maintain call bell within reach at all times, provide assistance with transfer out of bed and ambulation.   Suture clamps removed by k. Tice RN from middle back and left buttocks. Steri strips applied.

## 2018-07-01 ENCOUNTER — Ambulatory Visit: Payer: PPO

## 2018-07-10 ENCOUNTER — Other Ambulatory Visit: Payer: Self-pay

## 2018-07-13 ENCOUNTER — Ambulatory Visit: Payer: PPO | Admitting: Gastroenterology

## 2018-07-16 ENCOUNTER — Ambulatory Visit: Payer: PPO

## 2018-07-16 ENCOUNTER — Ambulatory Visit: Payer: PPO | Attending: Gastroenterology

## 2018-07-23 ENCOUNTER — Other Ambulatory Visit: Payer: Self-pay

## 2018-07-23 ENCOUNTER — Ambulatory Visit (HOSPITAL_BASED_OUTPATIENT_CLINIC_OR_DEPARTMENT_OTHER): Payer: PPO | Admitting: Pain Medicine

## 2018-07-23 ENCOUNTER — Encounter: Payer: Self-pay | Admitting: Pain Medicine

## 2018-07-23 ENCOUNTER — Ambulatory Visit
Admission: RE | Admit: 2018-07-23 | Discharge: 2018-07-23 | Disposition: A | Discharge status: Home / Self Care | Payer: PPO | Source: Ambulatory Visit | Attending: Pain Medicine | Admitting: Pain Medicine

## 2018-07-23 VITALS — BP 130/74 | HR 71 | Temp 98.5°F | Resp 18 | Ht 60.0 in | Wt 135.0 lb

## 2018-07-23 DIAGNOSIS — M47812 Spondylosis without myelopathy or radiculopathy, cervical region: Secondary | ICD-10-CM | POA: Insufficient documentation

## 2018-07-23 DIAGNOSIS — G8929 Other chronic pain: Secondary | ICD-10-CM | POA: Insufficient documentation

## 2018-07-23 DIAGNOSIS — Z79899 Other long term (current) drug therapy: Secondary | ICD-10-CM | POA: Diagnosis not present

## 2018-07-23 DIAGNOSIS — M542 Cervicalgia: Secondary | ICD-10-CM | POA: Diagnosis present

## 2018-07-23 DIAGNOSIS — M5030 Other cervical disc degeneration, unspecified cervical region: Secondary | ICD-10-CM | POA: Insufficient documentation

## 2018-07-23 DIAGNOSIS — M47892 Other spondylosis, cervical region: Secondary | ICD-10-CM | POA: Diagnosis not present

## 2018-07-23 MED ORDER — ROPIVACAINE HCL 2 MG/ML IJ SOLN
18.0000 mL | Freq: Once | INTRAMUSCULAR | Status: AC
  Administered 2018-07-23: 18 mL via PERINEURAL
  Filled 2018-07-23: qty 20

## 2018-07-23 MED ORDER — DEXAMETHASONE SODIUM PHOSPHATE 10 MG/ML IJ SOLN
20.0000 mg | Freq: Once | INTRAMUSCULAR | Status: AC
  Administered 2018-07-23: 10 mg
  Filled 2018-07-23: qty 2

## 2018-07-23 MED ORDER — MIDAZOLAM HCL 5 MG/5ML IJ SOLN
1.0000 mg | INTRAMUSCULAR | Status: DC | PRN
  Administered 2018-07-23: 4 mg via INTRAVENOUS
  Filled 2018-07-23: qty 5

## 2018-07-23 MED ORDER — FENTANYL CITRATE (PF) 100 MCG/2ML IJ SOLN
25.0000 ug | INTRAMUSCULAR | Status: DC | PRN
  Administered 2018-07-23: 100 ug via INTRAVENOUS
  Filled 2018-07-23: qty 2

## 2018-07-23 MED ORDER — LACTATED RINGERS IV SOLN
1000.0000 mL | Freq: Once | INTRAVENOUS | Status: AC
  Administered 2018-07-23: 1000 mL via INTRAVENOUS

## 2018-07-23 MED ORDER — LIDOCAINE HCL 2 % IJ SOLN
20.0000 mL | Freq: Once | INTRAMUSCULAR | Status: AC
  Administered 2018-07-23: 400 mg
  Filled 2018-07-23: qty 100

## 2018-07-23 NOTE — Progress Notes (Signed)
Safety precautions to be maintained throughout the outpatient stay will include: orient to surroundings, keep bed in low position, maintain call bell within reach at all times, provide assistance with transfer out of bed and ambulation.  

## 2018-07-23 NOTE — Progress Notes (Signed)
Patient's Name: Karen Edwards  MRN: [id number]  Referring Provider: Pleas Koch, NP  DOB: 1966/04/30  PCP: Pleas Koch, NP  DOS: 07/23/2018  Note by: Gaspar Cola, MD  Service setting: Ambulatory outpatient  Specialty: Interventional Pain Management  Patient type: Established  Location: [facility] (AMB) Pain Management Facility  Visit type: Interventional Procedure   Primary Reason for Visit: Interventional Pain Management Treatment. CC: Neck Pain (bilateral)  Procedure:          Anesthesia, Analgesia, Anxiolysis:  Type: Cervical Facet Medial Branch Block(s)  #3  Primary Purpose: Diagnostic Region: Posterolateral cervical spine Level: C3, C4, C5, C6, & C7 Medial Branch Level(s). Injecting these levels blocks the C3-4, C4-5, C5-6, and C6-7 cervical facet joints. Laterality: Bilateral  Type: Moderate (Conscious) Sedation combined with Local Anesthesia Indication(s): Analgesia and Anxiety Route: Intravenous (IV) IV Access: Secured Sedation: Meaningful verbal contact was maintained at all times during the procedure  Local Anesthetic: Lidocaine 1-2%  Position: Prone with head of the table raised to facilitate breathing.   Indications: 1. Spondylosis without myelopathy or radiculopathy, cervical region   2. Cervical facet syndrome   3. Cervicalgia   4. Chronic neck pain (Fourth Area of Pain) (Bilateral) (R>L)   5. DDD (degenerative disc disease), cervical    Pain Score: Pre-procedure: 3 /10 Post-procedure: 0-No pain/10  Pre-op Assessment:  Karen Edwards is a 52 y.o. (year old), female patient, seen today for interventional treatment. She  has a past surgical history that includes Cesarean section; Ankle surgery (Right, 03/02/2014); Hemorrhoid surgery (N/A, 06/16/2015); Cervical spine surgery (02/21/2016); Cesarean section with bilateral tubal ligation (1990); Colonoscopy (04/19/2015); Hysteroscopy w/D&C (12/24/2013); Colonoscopy with propofol (N/A, 05/18/2018);  Esophagogastroduodenoscopy (egd) with propofol (N/A, 05/18/2018); and Spinal cord stimulator insertion (N/A, 06/18/2018). Karen Edwards has a current medication list which includes the following prescription(s): atorvastatin, b-12, fluoxetine, methocarbamol, potassium, trazodone, hydrocodone-acetaminophen, hydrocodone-acetaminophen, and hydrocodone-acetaminophen, and the following Facility-Administered Medications: fentanyl and midazolam. Her primarily concern today is the Neck Pain (bilateral)  Initial Vital Signs:  Pulse/HCG Rate: 63ECG Heart Rate: 62 Temp: 98.1 F (36.7 C) Resp: 18 BP: 111/66 SpO2: 99 %  BMI: Estimated body mass index is 26.37 kg/m as calculated from the following:   Height as of this encounter: 5' (1.524 m).   Weight as of this encounter: 135 lb (61.2 kg).  Risk Assessment: Allergies: Reviewed. She is allergic to no known allergies.  Allergy Precautions: None required Coagulopathies: Reviewed. None identified.  Blood-thinner therapy: None at this time Active Infection(s): Reviewed. None identified. Karen Edwards is afebrile  Site Confirmation: Karen Edwards was asked to confirm the procedure and laterality before marking the site Procedure checklist: Completed Consent: Before the procedure and under the influence of no sedative(s), amnesic(s), or anxiolytics, the patient was informed of the treatment options, risks and possible complications. To fulfill our ethical and legal obligations, as recommended by the American Medical Association's Code of Ethics, I have informed the patient of my clinical impression; the nature and purpose of the treatment or procedure; the risks, benefits, and possible complications of the intervention; the alternatives, including doing nothing; the risk(s) and benefit(s) of the alternative treatment(s) or procedure(s); and the risk(s) and benefit(s) of doing nothing. The patient was provided information about the general risks and possible  complications associated with the procedure. These may include, but are not limited to: failure to achieve desired goals, infection, bleeding, organ or nerve damage, allergic reactions, paralysis, and death. In addition, the patient was informed of those risks  and complications associated to Spine-related procedures, such as failure to decrease pain; infection (i.e.: Meningitis, epidural or intraspinal abscess); bleeding (i.e.: epidural hematoma, subarachnoid hemorrhage, or any other type of intraspinal or peri-dural bleeding); organ or nerve damage (i.e.: Any type of peripheral nerve, nerve root, or spinal cord injury) with subsequent damage to sensory, motor, and/or autonomic systems, resulting in permanent pain, numbness, and/or weakness of one or several areas of the body; allergic reactions; (i.e.: anaphylactic reaction); and/or death. Furthermore, the patient was informed of those risks and complications associated with the medications. These include, but are not limited to: allergic reactions (i.e.: anaphylactic or anaphylactoid reaction(s)); adrenal axis suppression; blood sugar elevation that in diabetics may result in ketoacidosis or comma; water retention that in patients with history of congestive heart failure may result in shortness of breath, pulmonary edema, and decompensation with resultant heart failure; weight gain; swelling or edema; medication-induced neural toxicity; particulate matter embolism and blood vessel occlusion with resultant organ, and/or nervous system infarction; and/or aseptic necrosis of one or more joints. Finally, the patient was informed that Medicine is not an exact science; therefore, there is also the possibility of unforeseen or unpredictable risks and/or possible complications that may result in a catastrophic outcome. The patient indicated having understood very clearly. We have given the patient no guarantees and we have made no promises. Enough time was given to the  patient to ask questions, all of which were answered to the patient's satisfaction. Karen Edwards has indicated that she wanted to continue with the procedure. Attestation: I, the ordering provider, attest that I have discussed with the patient the benefits, risks, side-effects, alternatives, likelihood of achieving goals, and potential problems during recovery for the procedure that I have provided informed consent. Date  Time: 07/23/2018 12:59 PM  Pre-Procedure Preparation:  Monitoring: As per clinic protocol. Respiration, ETCO2, SpO2, BP, heart rate and rhythm monitor placed and checked for adequate function Safety Precautions: Patient was assessed for positional comfort and pressure points before starting the procedure. Time-out: I initiated and conducted the "Time-out" before starting the procedure, as per protocol. The patient was asked to participate by confirming the accuracy of the "Time Out" information. Verification of the correct person, site, and procedure were performed and confirmed by me, the nursing staff, and the patient. "Time-out" conducted as per Joint Commission's Universal Protocol (UP.01.01.01). Time: 1355  Description of Procedure:          Laterality: Bilateral. The procedure was performed in identical fashion on both sides. Level: C3, C4, C5, C6, & C7 Medial Branch Level(s). Area Prepped: Posterior Cervico-thoracic Region Prepping solution: ChloraPrep (2% chlorhexidine gluconate and 70% isopropyl alcohol) Safety Precautions: Aspiration looking for blood return was conducted prior to all injections. At no point did we inject any substances, as a needle was being advanced. Before injecting, the patient was told to immediately notify me if she was experiencing any new onset of "ringing in the ears, or metallic taste in the mouth". No attempts were made at seeking any paresthesias. Safe injection practices and needle disposal techniques used. Medications properly checked for  expiration dates. SDV (single dose vial) medications used. After the completion of the procedure, all disposable equipment used was discarded in the proper designated medical waste containers. Local Anesthesia: Protocol guidelines were followed. The patient was positioned over the fluoroscopy table. The area was prepped in the usual manner. The time-out was completed. The target area was identified using fluoroscopy. A 12-in long, straight, sterile hemostat was used with  fluoroscopic guidance to locate the targets for each level blocked. Once located, the skin was marked with an approved surgical skin marker. Once all sites were marked, the skin (epidermis, dermis, and hypodermis), as well as deeper tissues (fat, connective tissue and muscle) were infiltrated with a small amount of a short-acting local anesthetic, loaded on a 10cc syringe with a 25G, 1.5-in  Needle. An appropriate amount of time was allowed for local anesthetics to take effect before proceeding to the next step. Local Anesthetic: Lidocaine 2.0% The unused portion of the local anesthetic was discarded in the proper designated containers. Technical explanation of process:  C3 Medial Branch Nerve Block (MBB): The target area for the C3 dorsal medial articular branch is the lateral concave waist of the articular pillar of C3. Under fluoroscopic guidance, a Quincke needle was inserted until contact was made with os over the postero-lateral aspect of the articular pillar of C3 (target area). After negative aspiration for blood, 0.5 mL of the nerve block solution was injected without difficulty or complication. The needle was removed intact. C4 Medial Branch Nerve Block (MBB): The target area for the C4 dorsal medial articular branch is the lateral concave waist of the articular pillar of C4. Under fluoroscopic guidance, a Quincke needle was inserted until contact was made with os over the postero-lateral aspect of the articular pillar of C4 (target  area). After negative aspiration for blood, 0.5 mL of the nerve block solution was injected without difficulty or complication. The needle was removed intact. C5 Medial Branch Nerve Block (MBB): The target area for the C5 dorsal medial articular branch is the lateral concave waist of the articular pillar of C5. Under fluoroscopic guidance, a Quincke needle was inserted until contact was made with os over the postero-lateral aspect of the articular pillar of C5 (target area). After negative aspiration for blood, 0.5 mL of the nerve block solution was injected without difficulty or complication. The needle was removed intact. C6 Medial Branch Nerve Block (MBB): The target area for the C6 dorsal medial articular branch is the lateral concave waist of the articular pillar of C6. Under fluoroscopic guidance, a Quincke needle was inserted until contact was made with os over the postero-lateral aspect of the articular pillar of C6 (target area). After negative aspiration for blood, 0.5 mL of the nerve block solution was injected without difficulty or complication. The needle was removed intact. C7 Medial Branch Nerve Block (MBB): The target for the C7 dorsal medial articular branch lies on the superior-medial tip of the C7 transverse process. Under fluoroscopic guidance, a Quincke needle was inserted until contact was made with os over the postero-lateral aspect of the articular pillar of C7 (target area). After negative aspiration for blood, 0.5 mL of the nerve block solution was injected without difficulty or complication. The needle was removed intact. Procedural Needles: 22-gauge, 3.5-inch, Quincke needles used for all levels. Nerve block solution: 0.2% PF-Ropivacaine + Triamcinolone (40 mg/mL) diluted to a final concentration of 4 mg of Triamcinolone/mL of Ropivacaine The unused portion of the solution was discarded in the proper designated containers.  Once the entire procedure was completed, the treated area  was cleaned, making sure to leave some of the prepping solution back to take advantage of its long term bactericidal properties.  Vitals:   07/23/18 1405 07/23/18 1415 07/23/18 1425 07/23/18 1435  BP: 118/71 134/69 123/68 130/74  Pulse:      Resp: 16 14 12 18   Temp:  98.3 F (36.8 C)  98.5 F (36.9 C)  SpO2: 91% 98% 98% 98%  Weight:      Height:        Start Time: 1355 hrs. End Time: 1406 hrs.  Imaging Guidance (Spinal):          Type of Imaging Technique: Fluoroscopy Guidance (Spinal) Indication(s): Assistance in needle guidance and placement for procedures requiring needle placement in or near specific anatomical locations not easily accessible without such assistance. Exposure Time: Please see nurses notes. Contrast: None used. Fluoroscopic Guidance: I was personally present during the use of fluoroscopy. "Tunnel Vision Technique" used to obtain the best possible view of the target area. Parallax error corrected before commencing the procedure. "Direction-depth-direction" technique used to introduce the needle under continuous pulsed fluoroscopy. Once target was reached, antero-posterior, oblique, and lateral fluoroscopic projection used confirm needle placement in all planes. Images permanently stored in EMR. Interpretation: No contrast injected. I personally interpreted the imaging intraoperatively. Adequate needle placement confirmed in multiple planes. Permanent images saved into the patient's record.  Antibiotic Prophylaxis:   Anti-infectives (From admission, onward)   None     Indication(s): None identified  Post-operative Assessment:  Post-procedure Vital Signs:  Pulse/HCG Rate: 71(!) 57 Temp: 98.5 F (36.9 C) Resp: 18 BP: 130/74 SpO2: 98 %  EBL: None  Complications: No immediate post-treatment complications observed by team, or reported by patient.  Note: The patient tolerated the entire procedure well. A repeat set of vitals were taken after the procedure and  the patient was kept under observation following institutional policy, for this type of procedure. Post-procedural neurological assessment was performed, showing return to baseline, prior to discharge. The patient was provided with post-procedure discharge instructions, including a section on how to identify potential problems. Should any problems arise concerning this procedure, the patient was given instructions to immediately contact us, at any time, without hesitation. In any case, we plan to contact the patient by telephone for a follow-up status report regarding this interventional procedure.  Comments:  No additional relevant information.  Plan of Care    Imaging Orders     DG C-Arm 1-60 Min-No Report  Procedure Orders     CERVICAL FACET (MEDIAL BRANCH NERVE BLOCK)   Medications ordered for procedure: Meds ordered this encounter  Medications  . lidocaine (XYLOCAINE) 2 % (with pres) injection 400 mg  . midazolam (VERSED) 5 MG/5ML injection 1-2 mg    Make sure Flumazenil is available in the pyxis when using this medication. If oversedation occurs, administer 0.2 mg IV over 15 sec. If after 45 sec no response, administer 0.2 mg again over 1 min; may repeat at 1 min intervals; not to exceed 4 doses (1 mg)  . fentaNYL (SUBLIMAZE) injection 25-50 mcg    Make sure Narcan is available in the pyxis when using this medication. In the event of respiratory depression (RR< 8/min): Titrate NARCAN (naloxone) in increments of 0.1 to 0.2 mg IV at 2-3 minute intervals, until desired degree of reversal.  . lactated ringers infusion 1,000 mL  . ropivacaine (PF) 2 mg/mL (0.2%) (NAROPIN) injection 18 mL  . dexamethasone (DECADRON) injection 20 mg   Medications administered: We administered lidocaine, midazolam, fentaNYL, lactated ringers, ropivacaine (PF) 2 mg/mL (0.2%), and dexamethasone.  See the medical record for exact dosing, route, and time of administration.  New Prescriptions   No medications  on file   Disposition: Discharge home  Discharge Date & Time: 07/23/2018; 1438 hrs.   Physician-requested Follow-up: Return for post-procedure eval (2 wks), w/  Dr. Dossie Arbour.  Future Appointments  Date Time Provider [place]  08/10/2018  1:45 PM Milinda Pointer, MD [facility] None   Primary Care Physician: Pleas Koch, NP Location: [facility] Note by: Gaspar Cola, MD Date: 07/23/2018; Time: 2:51 PM  Disclaimer:  Medicine is not an Chief Strategy Officer. The only guarantee in medicine is that nothing is guaranteed. It is important to note that the decision to proceed with this intervention was based on the information collected from the patient. The Data and conclusions were drawn from the patient's questionnaire, the interview, and the physical examination. Because the information was provided in large part by the patient, it cannot be guaranteed that it has not been purposely or unconsciously manipulated. Every effort has been made to obtain as much relevant data as possible for this evaluation. It is important to note that the conclusions that lead to this procedure are derived in large part from the available data. Always take into account that the treatment will also be dependent on availability of resources and existing treatment guidelines, considered by other Pain Management Practitioners as being common knowledge and practice, at the time of the intervention. For Medico-Legal purposes, it is also important to point out that variation in procedural techniques and pharmacological choices are the acceptable norm. The indications, contraindications, technique, and results of the above procedure should only be interpreted and judged by a Board-Certified Interventional Pain Specialist with extensive familiarity and expertise in the same exact procedure and technique.

## 2018-07-23 NOTE — Patient Instructions (Signed)

## 2018-07-24 ENCOUNTER — Telehealth: Payer: Self-pay

## 2018-07-24 NOTE — Telephone Encounter (Signed)
Post procedure phone call.  Patient states she is wonderful.  

## 2018-08-06 NOTE — Progress Notes (Signed)
Patient's Name: Karen Edwards  MRN: [id number]  Referring Provider: Pleas Koch, NP  DOB: 01-01-1966  PCP: Pleas Koch, NP  DOS: 08/10/2018  Note by: Gaspar Cola, MD  Service setting: Ambulatory outpatient  Specialty: Interventional Pain Management  Location: [facility] (AMB) Pain Management Facility    Patient type: Established   Primary Reason(s) for Visit: Encounter for post-procedure evaluation of chronic illness with mild to moderate exacerbation CC: Neck Pain  HPI  Karen Edwards is a 52 y.o. year old, female patient, who comes today for a post-procedure evaluation. She has Chronic low back pain (Primary Area of Pain) (Bilateral) (R>L); Menorrhagia; Fatigue; DDD (degenerative disc disease), lumbar; DDD (degenerative disc disease), cervical; Chronic occipital neuralgia (Fifth Area of Pain) (Bilateral) (R>L); Lumbar facet syndrome (Bilateral) (L>R); DDD (degenerative disc disease), lumbosacral; Lumbar radicular pain (Right) (L4); Sacroiliac joint dysfunction (Bilateral); Depression; Hemorrhoids; Insomnia; Chronic constipation; Pharmacologic therapy; Hyperlipidemia, unspecified; Chronic neck pain (Fourth Area of Pain) (Bilateral) (R>L); GERD (gastroesophageal reflux disease); Numbness and tingling of upper extremity (Right); Chronic upper extremity pain (Bilateral) (R>L); Chronic sacroiliac joint pain (Bilateral) (L>R); Lumbar facet hypertrophy (Bilateral); L1-2 disc extrusion (Right); Cervical foraminal stenosis (C5-6) (Bilateral); Long term prescription opiate use; Opiate use; Disorder of skeletal system; Problems influencing health status; Chronic pain syndrome; Chronic cervical radicular pain (Right: C6/C7) (Left: C5/T1); Chronic lumbar radicular pain; Chronic hip pain (Tertiary Area of Pain) (Bilateral) (R>L); Chronic lower extremity pain (Secondary Area of Pain) (Bilateral) (R>L); Chronic shoulder pain (Bilateral) (L>R); Chronic musculoskeletal pain; Lumbar spondylosis; Stress  incontinence of urine; Cervical facet syndrome; Myofascial pain; Spondylosis without myelopathy or radiculopathy, cervical region; Cervicalgia; Nausea and vomiting; History of colonic polyps; S/P insertion of Epidural Neurostimulator (SCS) (Bilateral); MVP (mitral valve prolapse); and Multiple nevi on their problem list. Her primarily concern today is the Neck Pain  Pain Assessment: Location:   Neck Radiating: both arms Onset: More than a month ago Duration: Chronic pain Quality: Aching, Burning, Sharp Severity: 4 /10 (subjective, self-reported pain score)  Note: Reported level is inconsistent with clinical observations. Clinically the patient looks like a 2/10 A 2/10 is viewed as "Mild to Moderate" and described as noticeable and distracting. Impossible to hide from other people. More frequent flare-ups. Still possible to adapt and function close to normal. It can be very annoying and may have occasional stronger flare-ups. With discipline, patients may get used to it and adapt. Information on the proper use of the pain scale provided to the patient today. When using our objective Pain Scale, levels between 6 and 10/10 are said to belong in an emergency room, as it progressively worsens from a 6/10, described as severely limiting, requiring emergency care not usually available at an outpatient pain management facility. At a 6/10 level, communication becomes difficult and requires great effort. Assistance to reach the emergency department may be required. Facial flushing and profuse sweating along with potentially dangerous increases in heart rate and blood pressure will be evident. Timing: Constant Modifying factors: lying down BP: 105/60  HR: 81  Karen Edwards comes in today for post-procedure evaluation.  Further details on both, my assessment(s), as well as the proposed treatment plan, please see below.  Post-Procedure Assessment  07/23/2018 Procedure: Diagnostic bilateral cervical facet block  #3under fluoroscopic guidance and IV sedation. Pre-procedure pain score:  3/10 Post-procedure pain score: 0/10 (100% relief) Influential Factors: BMI: 27.34 kg/m Intra-procedural challenges: None observed.         Assessment challenges: None detected.  Reported side-effects: None.        Post-procedural adverse reactions or complications: None reported         Sedation: Sedation provided. When no sedatives are used, the analgesic levels obtained are directly associated to the effectiveness of the local anesthetics. However, when sedation is provided, the level of analgesia obtained during the initial 1 hour following the intervention, is believed to be the result of a combination of factors. These factors may include, but are not limited to: 1. The effectiveness of the local anesthetics used. 2. The effects of the analgesic(s) and/or anxiolytic(s) used. 3. The degree of discomfort experienced by the patient at the time of the procedure. 4. The patients ability and reliability in recalling and recording the events. 5. The presence and influence of possible secondary gains and/or psychosocial factors. Reported result: Relief experienced during the 1st hour after the procedure: 100 % (Ultra-Short Term Relief) Karen Edwards has indicated area to have been numb during this time. Interpretative annotation: Clinically appropriate result. Analgesia during this period is likely to be Local Anesthetic and/or IV Sedative (Analgesic/Anxiolytic) related.          Effects of local anesthetic: The analgesic effects attained during this period are directly associated to the localized infiltration of local anesthetics and therefore cary significant diagnostic value as to the etiological location, or anatomical origin, of the pain. Expected duration of relief is directly dependent on the pharmacodynamics of the local anesthetic used. Long-acting (4-6 hours) anesthetics used.  Reported result: Relief  during the next 4 to 6 hour after the procedure: 100 % (Short-Term Relief) Karen Edwards has indicated area to have been numb during this time. Interpretative annotation: Clinically appropriate result. Analgesia during this period is likely to be Local Anesthetic-related.          Long-term benefit: Defined as the period of time past the expected duration of local anesthetics (1 hour for short-acting and 4-6 hours for long-acting). With the possible exception of prolonged sympathetic blockade from the local anesthetics, benefits during this period are typically attributed to, or associated with, other factors such as analgesic sensory neuropraxia, antiinflammatory effects, or beneficial biochemical changes provided by agents other than the local anesthetics.  Reported result: Extended relief following procedure: 100 %(lasted 3 days) (Long-Term Relief)            Interpretative annotation: Clinically possible results. Good relief. No permanent benefit expected. Inflammation plays a part in the etiology to the pain.          Current benefits: Defined as reported results that persistent at this point in time.   Analgesia: >50 % Ms. Gilkison reports improvement of axial symptoms. Function: Ms. Schou reports improvement in function ROM: Ms. Stoker reports improvement in ROM Interpretative annotation: Ongoing benefit. Incomplete therapeutic success. Results would suggest persistent aggravating factors. Benefit could be steroid-related.  Interpretation: Results would suggest a successful diagnostic intervention. We'll proceed with the next treatment, as soon as convenient          Plan:  Proceed with Radiofrequency Ablation for the purpose of attaining long-term benefits.       "The patient has failed to respond to conservative therapies including over-the-counter medications, anti-inflammatories, muscle relaxants, membrane stabilizers, opioids, physical therapy modalities such as heat and ice, as well  as more invasive techniques such as nerve blocks. Because Ms. Gordon did attain more than 50% relief of the pain during a series of diagnostic blocks conducted in separate occasions, I believe it is medically  necessary to proceed with Radiofrequency Ablation, in order to attempt gaining longer relief.   Medical Necessity: Ms. Narasimhan has been dealing with the above chronic pain from the Spondylosis without myelopathy or radiculopathy, cervical region [M47.812] for longer than three months and has either failed to respond, was unable to tolerate, or simply did not get enough benefit from other more conservative therapies including, but not limited to: 1. Over-the-counter medications 2. Anti-inflammatory medications 3. Muscle relaxants 4. Membrane stabilizers 5. Opioids 6. Modalities (Heat, ice, etc.) 7. Invasive techniques such as nerve blocks. Ms. Vanderloop has attained more than 50% relief of the pain from a series of diagnostic injections conducted in separate occasions. For this reason, I believe it is medically necessary to proceed with Radiofrequency Ablation for the purpose of attempting to prolong the duration of the benefits seen with the diagnostic injections.   Controlled Substance Pharmacotherapy Assessment REMS (Risk Evaluation and Mitigation Strategy)  Analgesic: Hydrocodone/APAP 5/325 1 tablet p.o. twice daily MME/day:53m/day WLandis Martins RN  08/10/2018  1:52 PM  Sign at close encounter Safety precautions to be maintained throughout the outpatient stay will include: orient to surroundings, keep bed in low position, maintain call bell within reach at all times, provide assistance with transfer out of bed and ambulation.    Pharmacokinetics: Liberation and absorption (onset of action): WNL Distribution (time to peak effect): WNL Metabolism and excretion (duration of action): WNL         Pharmacodynamics: Desired effects: Analgesia: Ms. SMonereports >50%  benefit. Functional ability: Patient reports that medication allows her to accomplish basic ADLs Clinically meaningful improvement in function (CMIF): Sustained CMIF goals met Perceived effectiveness: Described as relatively effective, allowing for increase in activities of daily living (ADL) Undesirable effects: Side-effects or Adverse reactions: None reported Monitoring: [place] PMP: Online review of the past 165-montheriod conducted. Compliant with practice rules and regulations Last UDS on record: Summary  Date Value Ref Range Status  04/06/2018 FINAL  Final    Comment:    ==================================================================== TOXASSURE SELECT 13 (MW) ==================================================================== Test                             Result       Flag       Units Drug Present and Declared for Prescription Verification   Norhydrocodone                 228          EXPECTED   ng/mg creat    Norhydrocodone is an expected metabolite of hydrocodone. Drug Absent but Declared for Prescription Verification   Hydrocodone                    Not Detected UNEXPECTED ng/mg creat    Hydrocodone is almost always present in patients taking this drug    consistently. Absence of hydrocodone could be due to lapse of    time since the last dose or unusual pharmacokinetics (rapid    metabolism). ==================================================================== Test                      Result    Flag   Units      Ref Range   Creatinine              60               mg/dL      >=20 ====================================================================  Declared Medications:  The flagging and interpretation on this report are based on the  following declared medications.  Unexpected results may arise from  inaccuracies in the declared medications.  **Note: The testing scope of this panel includes these medications:  Hydrocodone (Norco)  **Note: The testing scope of this  panel does not include following  reported medications:  Acetaminophen (Norco)  Atorvastatin (Lipitor)  Cyanocobalamin  Fluoxetine (Prozac)  Methocarbamol (Robaxin)  Potassium  Supplement  Trazodone (Desyrel) ==================================================================== For clinical consultation, please call [phone]. ====================================================================    UDS interpretation: Compliant          Medication Assessment Form: Reviewed. Patient indicates being compliant with therapy Treatment compliance: Compliant Risk Assessment Profile: Aberrant behavior: See prior evaluations. None observed or detected today Comorbid factors increasing risk of overdose: See prior notes. No additional risks detected today Risk of substance use disorder (SUD): Low  ORT Scoring interpretation table:  Score <3 = Low Risk for SUD  Score between 4-7 = Moderate Risk for SUD  Score >8 = High Risk for Opioid Abuse   Risk Mitigation Strategies:  Patient Counseling: Covered Patient-Prescriber Agreement (PPA): Present and active  Notification to other healthcare providers: Done  Pharmacologic Plan: No change in therapy, at this time.             Laboratory Chemistry  Inflammation Markers (CRP: Acute Phase) (ESR: Chronic Phase) Lab Results  Component Value Date   CRP 1.4 09/08/2017   ESRSEDRATE 11 09/08/2017                         Renal Markers Lab Results  Component Value Date   BUN 7 09/08/2017   CREATININE 0.84 09/08/2017   BCR 8 (L) 09/08/2017   GFRAA 93 09/08/2017   GFRNONAA 81 09/08/2017                             Hepatic Markers Lab Results  Component Value Date   AST 18 09/08/2017   ALT 12 (L) 01/30/2017   ALBUMIN 4.7 09/08/2017                        Neuropathy Markers Lab Results  Component Value Date   VITAMINB12 >2000 (H) 09/08/2017                        Hematology Parameters Lab Results  Component Value Date   PLT  202.0 05/20/2017   HGB 13.5 05/20/2017   HCT 40.5 05/20/2017                        CV Markers Lab Results  Component Value Date   TROPONINI <0.03 01/30/2017                         Note: Lab results reviewed.  Recent Imaging Results   Results for orders placed in visit on 07/23/18  DG C-Arm 1-60 Min-No Report   Narrative Fluoroscopy was utilized by the requesting physician.  No radiographic  interpretation.    Interpretation Report: Fluoroscopy was used during the procedure to assist with needle guidance. The images were interpreted intraoperatively by the requesting physician.  Meds   Current Outpatient Medications:  .  atorvastatin (LIPITOR) 10 MG tablet, Take 1 tablet (10 mg total) by mouth daily. (Patient taking differently: Take  10 mg by mouth at bedtime. ), Disp: 90 tablet, Rfl: 3 .  Cyanocobalamin (B-12) 500 MCG TABS, Take 1,000 mcg by mouth daily., Disp: , Rfl:  .  FLUoxetine (PROZAC) 20 MG capsule, Take 3 capsules (60 mg total) by mouth daily., Disp: 270 capsule, Rfl: 3 .  methocarbamol (ROBAXIN) 500 MG tablet, Take 2 tablets (1,000 mg total) by mouth at bedtime., Disp: 180 tablet, Rfl: 1 .  Potassium 99 MG TABS, Take 198 mg by mouth daily. , Disp: , Rfl:  .  traZODone (DESYREL) 100 MG tablet, Take 3 tablets (300 mg total) by mouth at bedtime., Disp: 270 tablet, Rfl: 3 .  HYDROcodone-acetaminophen (NORCO/VICODIN) 5-325 MG tablet, Take 1-2 tablets by mouth at bedtime. Must last 30 days., Disp: 60 tablet, Rfl: 0  ROS  Constitutional: Denies any fever or chills Gastrointestinal: No reported hemesis, hematochezia, vomiting, or acute GI distress Musculoskeletal: Denies any acute onset joint swelling, redness, loss of ROM, or weakness Neurological: No reported episodes of acute onset apraxia, aphasia, dysarthria, agnosia, amnesia, paralysis, loss of coordination, or loss of consciousness  Allergies  Ms. Cancio is allergic to no known allergies.  [place]  Drug: Ms.  Dishner  reports that she does not use drugs. Alcohol:  reports that she drinks about 1.0 standard drinks of alcohol per week. Tobacco:  reports that she has been smoking cigarettes. She has a 18.50 pack-year smoking history. She has never used smokeless tobacco. Medical:  has a past medical history of Anxiety, Chronic back pain, Degenerative joint disease, Depression, Dyspnea, Hypotension, IBS (irritable bowel syndrome) (05/2015), and MVP (mitral valve prolapse). Surgical: Ms. Daquila  has a past surgical history that includes Cesarean section; Ankle surgery (Right, 03/02/2014); Hemorrhoid surgery (N/A, 06/16/2015); Cervical spine surgery (02/21/2016); Cesarean section with bilateral tubal ligation (1990); Colonoscopy (04/19/2015); Hysteroscopy w/D&C (12/24/2013); Colonoscopy with propofol (N/A, 05/18/2018); Esophagogastroduodenoscopy (egd) with propofol (N/A, 05/18/2018); and Spinal cord stimulator insertion (N/A, 06/18/2018). Family: family history includes Arthritis in her mother; Congestive Heart Failure in her maternal grandfather; Depression in her mother; Diabetes in her maternal aunt; Diabetes Mellitus I in her maternal grandmother; Gout in her maternal aunt; Hearing loss in her father; Heart attack in her maternal grandfather; Hyperlipidemia in her father and mother; Hypertension in her father and mother; Hypothyroidism in her mother; Kidney disease in her mother; Prostate cancer in her paternal grandfather; Stroke in her maternal grandmother; Thyroid disease in her maternal aunt; Vision loss in her mother.  Constitutional Exam  General appearance: Well nourished, well developed, and well hydrated. In no apparent acute distress Vitals:   08/10/18 1348  BP: 105/60  Pulse: 81  Resp: 16  Temp: 98.6 F (37 C)  TempSrc: Oral  SpO2: 98%  Weight: 140 lb (63.5 kg)  Height: 5' (1.524 m)   BMI Assessment: Estimated body mass index is 27.34 kg/m as calculated from the following:   Height as of  this encounter: 5' (1.524 m).   Weight as of this encounter: 140 lb (63.5 kg).  BMI interpretation table: BMI level Category Range association with higher incidence of chronic pain  <18 kg/m2 Underweight   18.5-24.9 kg/m2 Ideal body weight   25-29.9 kg/m2 Overweight Increased incidence by 20%  30-34.9 kg/m2 Obese (Class I) Increased incidence by 68%  35-39.9 kg/m2 Severe obesity (Class II) Increased incidence by 136%  >40 kg/m2 Extreme obesity (Class III) Increased incidence by 254%   Patient's current BMI Ideal Body weight  Body mass index is 27.34 kg/m. Ideal body weight: 45.5  kg (100 lb 4.9 oz) Adjusted ideal body weight: 52.7 kg (116 lb 3 oz)   BMI Readings from Last 4 Encounters:  08/10/18 27.34 kg/m  08/10/18 27.59 kg/m  07/23/18 26.37 kg/m  06/30/18 26.45 kg/m   Wt Readings from Last 4 Encounters:  08/10/18 140 lb (63.5 kg)  08/10/18 141 lb 4 oz (64.1 kg)  07/23/18 135 lb (61.2 kg)  06/30/18 140 lb (63.5 kg)  Psych/Mental status: Alert, oriented x 3 (person, place, & time)       Eyes: PERLA Respiratory: No evidence of acute respiratory distress  Cervical Spine Area Exam  Skin & Axial Inspection: No masses, redness, edema, swelling, or associated skin lesions Alignment: Symmetrical Functional ROM: Improved after treatment      Stability: No instability detected Muscle Tone/Strength: Functionally intact. No obvious neuro-muscular anomalies detected. Sensory (Neurological): Movement-associated discomfort Palpation: Complains of area being tender to palpation              Upper Extremity (UE) Exam    Side: Right upper extremity  Side: Left upper extremity  Skin & Extremity Inspection: Skin color, temperature, and hair growth are WNL. No peripheral edema or cyanosis. No masses, redness, swelling, asymmetry, or associated skin lesions. No contractures.  Skin & Extremity Inspection: Skin color, temperature, and hair growth are WNL. No peripheral edema or cyanosis. No  masses, redness, swelling, asymmetry, or associated skin lesions. No contractures.  Functional ROM: Unrestricted ROM          Functional ROM: Unrestricted ROM          Muscle Tone/Strength: Functionally intact. No obvious neuro-muscular anomalies detected.  Muscle Tone/Strength: Functionally intact. No obvious neuro-muscular anomalies detected.  Sensory (Neurological): Unimpaired          Sensory (Neurological): Unimpaired          Palpation: No palpable anomalies              Palpation: No palpable anomalies              Provocative Test(s):  Phalen's test: deferred Tinel's test: deferred Apley's scratch test (touch opposite shoulder):  Action 1 (Across chest): deferred Action 2 (Overhead): deferred Action 3 (LB reach): deferred   Provocative Test(s):  Phalen's test: deferred Tinel's test: deferred Apley's scratch test (touch opposite shoulder):  Action 1 (Across chest): deferred Action 2 (Overhead): deferred Action 3 (LB reach): deferred    Thoracic Spine Area Exam  Skin & Axial Inspection: No masses, redness, or swelling Alignment: Symmetrical Functional ROM: Unrestricted ROM Stability: No instability detected Muscle Tone/Strength: Functionally intact. No obvious neuro-muscular anomalies detected. Sensory (Neurological): Unimpaired Muscle strength & Tone: No palpable anomalies  Lumbar Spine Area Exam  Skin & Axial Inspection: No masses, redness, or swelling Alignment: Symmetrical Functional ROM: Unrestricted ROM       Stability: No instability detected Muscle Tone/Strength: Functionally intact. No obvious neuro-muscular anomalies detected. Sensory (Neurological): Unimpaired Palpation: No palpable anomalies       Provocative Tests: Hyperextension/rotation test: deferred today       Lumbar quadrant test (Kemp's test): deferred today       Lateral bending test: deferred today       Patrick's Maneuver: deferred today                   FABER test: deferred today                    S-I anterior distraction/compression test: deferred today  S-I lateral compression test: deferred today         S-I Thigh-thrust test: deferred today         S-I Gaenslen's test: deferred today          Gait & Posture Assessment  Ambulation: Unassisted Gait: Relatively normal for age and body habitus Posture: WNL   Lower Extremity Exam    Side: Right lower extremity  Side: Left lower extremity  Stability: No instability observed          Stability: No instability observed          Skin & Extremity Inspection: Skin color, temperature, and hair growth are WNL. No peripheral edema or cyanosis. No masses, redness, swelling, asymmetry, or associated skin lesions. No contractures.  Skin & Extremity Inspection: Skin color, temperature, and hair growth are WNL. No peripheral edema or cyanosis. No masses, redness, swelling, asymmetry, or associated skin lesions. No contractures.  Functional ROM: Unrestricted ROM                  Functional ROM: Unrestricted ROM                  Muscle Tone/Strength: Functionally intact. No obvious neuro-muscular anomalies detected.  Muscle Tone/Strength: Functionally intact. No obvious neuro-muscular anomalies detected.  Sensory (Neurological): Unimpaired  Sensory (Neurological): Unimpaired  Palpation: No palpable anomalies  Palpation: No palpable anomalies   Assessment  Primary Diagnosis & Pertinent Problem List: The primary encounter diagnosis was Cervicalgia. Diagnoses of Cervical facet syndrome, Spondylosis without myelopathy or radiculopathy, cervical region, Chronic occipital neuralgia (Fifth Area of Pain) (Bilateral) (R>L), Chronic neck pain (Fourth Area of Pain) (Bilateral) (R>L), Chronic pain syndrome, and Chronic musculoskeletal pain were also pertinent to this visit.  Status Diagnosis  Improving Improving Stable 1. Cervicalgia   2. Cervical facet syndrome   3. Spondylosis without myelopathy or radiculopathy, cervical region   4. Chronic  occipital neuralgia (Fifth Area of Pain) (Bilateral) (R>L)   5. Chronic neck pain (Fourth Area of Pain) (Bilateral) (R>L)   6. Chronic pain syndrome   7. Chronic musculoskeletal pain     Problems updated and reviewed during this visit: No problems updated. Plan of Care  Pharmacotherapy (Medications Ordered): Meds ordered this encounter  Medications  . methocarbamol (ROBAXIN) 500 MG tablet    Sig: Take 2 tablets (1,000 mg total) by mouth at bedtime.    Dispense:  180 tablet    Refill:  1    Do not place medication on "Automatic Refill". Fill one day early if pharmacy is closed on scheduled refill date.  Marland Kitchen HYDROcodone-acetaminophen (NORCO/VICODIN) 5-325 MG tablet    Sig: Take 1-2 tablets by mouth at bedtime. Must last 30 days.    Dispense:  60 tablet    Refill:  0    [place] STOP ACT - Not applicable. Fill one day early if pharmacy is closed on scheduled refill date. Must last 30 days.   Medications administered today: Latash L. Trinidad had no medications administered during this visit.   Procedure Orders     Radiofrequency,Cervical     Radiofrequency,Cervical Lab Orders  No laboratory test(s) ordered today   Imaging Orders  No imaging studies ordered today   Referral Orders  No referral(s) requested today   Interventional management options: Planned, scheduled, and/or pending:   Diagnostic Right cervical facet block RFA #1under fluoroscopic guidance and IV sedation.   Considering:   Diagnostic bilateral lumbar facet block Possible bilateral lumbar facet RFA Diagnostic bilateral sacroiliac  joint block Possible bilateral sacroiliac joint RFA Diagnostic bilateral intra-articular hip joint injection Diagnostic bilateral femoral nerve block + obturator nerve block Possible bilateral femoral nerve + obturator nerve RFA Diagnostic right-sided L1-2lumbar epidural steroid injection Diagnostic right-sided L1-2transforaminal epidural steroid injection Diagnostic  bilateralL4-5lumbar transforaminal epidural steroid injection Diagnostic bilateral cervical facet block Possible bilateral cervical facet RFA Diagnostic right-sided cervical epidural steroid injection Diagnostic bilateral greater occipital nerve block Possible bilateral occipital nerve RFA Diagnostic bilateral intra-articular shoulder joint injection Diagnostic bilateral suprascapular nerve block Possible bilateral suprascapular nerve RFA   Palliative PRN treatment(s):   Diagnostic bilateral cervical facet block #2 Diagnostic right-sided L1-2 interlaminar LESI+ right-sided L1 TFESI   Provider-requested follow-up: No follow-ups on file.  No future appointments. Primary Care Physician: Pleas Koch, NP Location: [facility] Note by: Gaspar Cola, MD Date: 08/10/2018; Time: 2:34 PM

## 2018-08-10 ENCOUNTER — Encounter: Payer: Self-pay | Admitting: Primary Care

## 2018-08-10 ENCOUNTER — Other Ambulatory Visit: Payer: Self-pay

## 2018-08-10 ENCOUNTER — Ambulatory Visit (INDEPENDENT_AMBULATORY_CARE_PROVIDER_SITE_OTHER): Payer: PPO | Admitting: Primary Care

## 2018-08-10 ENCOUNTER — Encounter: Payer: Self-pay | Admitting: Pain Medicine

## 2018-08-10 ENCOUNTER — Ambulatory Visit: Payer: PPO | Attending: Pain Medicine | Admitting: Pain Medicine

## 2018-08-10 VITALS — BP 126/74 | HR 97 | Temp 98.0°F | Ht 60.0 in | Wt 141.2 lb

## 2018-08-10 VITALS — BP 105/60 | HR 81 | Temp 98.6°F | Resp 16 | Ht 60.0 in | Wt 140.0 lb

## 2018-08-10 DIAGNOSIS — G8929 Other chronic pain: Secondary | ICD-10-CM

## 2018-08-10 DIAGNOSIS — K219 Gastro-esophageal reflux disease without esophagitis: Secondary | ICD-10-CM | POA: Diagnosis not present

## 2018-08-10 DIAGNOSIS — G894 Chronic pain syndrome: Secondary | ICD-10-CM

## 2018-08-10 DIAGNOSIS — M25511 Pain in right shoulder: Secondary | ICD-10-CM | POA: Diagnosis not present

## 2018-08-10 DIAGNOSIS — M47896 Other spondylosis, lumbar region: Secondary | ICD-10-CM | POA: Diagnosis not present

## 2018-08-10 DIAGNOSIS — F17210 Nicotine dependence, cigarettes, uncomplicated: Secondary | ICD-10-CM | POA: Diagnosis not present

## 2018-08-10 DIAGNOSIS — Z5181 Encounter for therapeutic drug level monitoring: Secondary | ICD-10-CM | POA: Insufficient documentation

## 2018-08-10 DIAGNOSIS — M542 Cervicalgia: Secondary | ICD-10-CM

## 2018-08-10 DIAGNOSIS — M7918 Myalgia, other site: Secondary | ICD-10-CM | POA: Diagnosis not present

## 2018-08-10 DIAGNOSIS — Z79899 Other long term (current) drug therapy: Secondary | ICD-10-CM | POA: Insufficient documentation

## 2018-08-10 DIAGNOSIS — M47892 Other spondylosis, cervical region: Secondary | ICD-10-CM | POA: Insufficient documentation

## 2018-08-10 DIAGNOSIS — M5137 Other intervertebral disc degeneration, lumbosacral region: Secondary | ICD-10-CM | POA: Diagnosis not present

## 2018-08-10 DIAGNOSIS — E785 Hyperlipidemia, unspecified: Secondary | ICD-10-CM | POA: Insufficient documentation

## 2018-08-10 DIAGNOSIS — M25512 Pain in left shoulder: Secondary | ICD-10-CM | POA: Diagnosis not present

## 2018-08-10 DIAGNOSIS — M5136 Other intervertebral disc degeneration, lumbar region: Secondary | ICD-10-CM | POA: Insufficient documentation

## 2018-08-10 DIAGNOSIS — M79602 Pain in left arm: Secondary | ICD-10-CM | POA: Diagnosis not present

## 2018-08-10 DIAGNOSIS — M5481 Occipital neuralgia: Secondary | ICD-10-CM | POA: Insufficient documentation

## 2018-08-10 DIAGNOSIS — M47812 Spondylosis without myelopathy or radiculopathy, cervical region: Secondary | ICD-10-CM

## 2018-08-10 DIAGNOSIS — M4802 Spinal stenosis, cervical region: Secondary | ICD-10-CM | POA: Diagnosis not present

## 2018-08-10 DIAGNOSIS — Z823 Family history of stroke: Secondary | ICD-10-CM | POA: Insufficient documentation

## 2018-08-10 DIAGNOSIS — N3281 Overactive bladder: Secondary | ICD-10-CM | POA: Insufficient documentation

## 2018-08-10 DIAGNOSIS — D229 Melanocytic nevi, unspecified: Secondary | ICD-10-CM | POA: Diagnosis not present

## 2018-08-10 DIAGNOSIS — K581 Irritable bowel syndrome with constipation: Secondary | ICD-10-CM | POA: Insufficient documentation

## 2018-08-10 DIAGNOSIS — F419 Anxiety disorder, unspecified: Secondary | ICD-10-CM | POA: Insufficient documentation

## 2018-08-10 DIAGNOSIS — F329 Major depressive disorder, single episode, unspecified: Secondary | ICD-10-CM | POA: Diagnosis not present

## 2018-08-10 DIAGNOSIS — M79604 Pain in right leg: Secondary | ICD-10-CM | POA: Diagnosis not present

## 2018-08-10 DIAGNOSIS — M79605 Pain in left leg: Secondary | ICD-10-CM | POA: Diagnosis not present

## 2018-08-10 DIAGNOSIS — M5030 Other cervical disc degeneration, unspecified cervical region: Secondary | ICD-10-CM | POA: Insufficient documentation

## 2018-08-10 DIAGNOSIS — G4700 Insomnia, unspecified: Secondary | ICD-10-CM | POA: Diagnosis not present

## 2018-08-10 DIAGNOSIS — N393 Stress incontinence (female) (male): Secondary | ICD-10-CM | POA: Insufficient documentation

## 2018-08-10 DIAGNOSIS — M533 Sacrococcygeal disorders, not elsewhere classified: Secondary | ICD-10-CM | POA: Diagnosis not present

## 2018-08-10 DIAGNOSIS — N920 Excessive and frequent menstruation with regular cycle: Secondary | ICD-10-CM | POA: Insufficient documentation

## 2018-08-10 DIAGNOSIS — Z86010 Personal history of colonic polyps: Secondary | ICD-10-CM | POA: Insufficient documentation

## 2018-08-10 DIAGNOSIS — Z79891 Long term (current) use of opiate analgesic: Secondary | ICD-10-CM | POA: Diagnosis not present

## 2018-08-10 DIAGNOSIS — Z8249 Family history of ischemic heart disease and other diseases of the circulatory system: Secondary | ICD-10-CM | POA: Insufficient documentation

## 2018-08-10 DIAGNOSIS — Z23 Encounter for immunization: Secondary | ICD-10-CM | POA: Diagnosis not present

## 2018-08-10 DIAGNOSIS — Z833 Family history of diabetes mellitus: Secondary | ICD-10-CM | POA: Insufficient documentation

## 2018-08-10 MED ORDER — METHOCARBAMOL 500 MG PO TABS: 1000.0000 mg | ORAL_TABLET | Freq: Every day | ORAL | 1 refills | Status: DC

## 2018-08-10 MED ORDER — HYDROCODONE-ACETAMINOPHEN 5-325 MG PO TABS: 1.0000 | ORAL_TABLET | Freq: Every day | ORAL | 0 refills | Status: DC

## 2018-08-10 NOTE — Progress Notes (Signed)
Safety precautions to be maintained throughout the outpatient stay will include: orient to surroundings, keep bed in low position, maintain call bell within reach at all times, provide assistance with transfer out of bed and ambulation.  

## 2018-08-10 NOTE — Patient Instructions (Addendum)
____________________________________________________________________________________________  Preparing for Procedure with Sedation  Instructions: . Oral Intake: Do not eat or drink anything for at least 8 hours prior to your procedure. . Transportation: Public transportation is not allowed. Bring an adult driver. The driver must be physically present in our waiting room before any procedure can be started. . Physical Assistance: Bring an adult physically capable of assisting you, in the event you need help. This adult should keep you company at home for at least 6 hours after the procedure. . Blood Pressure Medicine: Take your blood pressure medicine with a sip of water the morning of the procedure. . Blood thinners: Notify our staff if you are taking any blood thinners. Depending on which one you take, there will be specific instructions on how and when to stop it. . Diabetics on insulin: Notify the staff so that you can be scheduled 1st case in the morning. If your diabetes requires high dose insulin, take only  of your normal insulin dose the morning of the procedure and notify the staff that you have done so. . Preventing infections: Shower with an antibacterial soap the morning of your procedure. . Build-up your immune system: Take 1000 mg of Vitamin C with every meal (3 times a day) the day prior to your procedure. . Antibiotics: Inform the staff if you have a condition or reason that requires you to take antibiotics before dental procedures. . Pregnancy: If you are pregnant, call and cancel the procedure. . Sickness: If you have a cold, fever, or any active infections, call and cancel the procedure. . Arrival: You must be in the facility at least 30 minutes prior to your scheduled procedure. . Children: Do not bring children with you. . Dress appropriately: Bring dark clothing that you would not mind if they get stained. . Valuables: Do not bring any jewelry or valuables.  Procedure  appointments are reserved for interventional treatments only. . No Prescription Refills. . No medication changes will be discussed during procedure appointments. . No disability issues will be discussed.  Reasons to call and reschedule or cancel your procedure: (Following these recommendations will minimize the risk of a serious complication.) . Surgeries: Avoid having procedures within 2 weeks of any surgery. (Avoid for 2 weeks before or after any surgery). . Flu Shots: Avoid having procedures within 2 weeks of a flu shots or . (Avoid for 2 weeks before or after immunizations). . Barium: Avoid having a procedure within 7-10 days after having had a radiological study involving the use of radiological contrast. (Myelograms, Barium swallow or enema study). . Heart attacks: Avoid any elective procedures or surgeries for the initial 6 months after a "Myocardial Infarction" (Heart Attack). . Blood thinners: It is imperative that you stop these medications before procedures. Let us know if you if you take any blood thinner.  . Infection: Avoid procedures during or within two weeks of an infection (including chest colds or gastrointestinal problems). Symptoms associated with infections include: Localized redness, fever, chills, night sweats or profuse sweating, burning sensation when voiding, cough, congestion, stuffiness, runny nose, sore throat, diarrhea, nausea, vomiting, cold or Flu symptoms, recent or current infections. It is specially important if the infection is over the area that we intend to treat. . Heart and lung problems: Symptoms that may suggest an active cardiopulmonary problem include: cough, chest pain, breathing difficulties or shortness of breath, dizziness, ankle swelling, uncontrolled high or unusually low blood pressure, and/or palpitations. If you are experiencing any of these symptoms, cancel   your procedure and contact your primary care physician for an evaluation.  Remember:   Regular Business hours are:  Monday to Thursday 8:00 AM to 4:00 PM  Provider's Schedule: Milinda Pointer, MD:  Procedure days: Tuesday and Thursday 7:30 AM to 4:00 PM  Gillis Santa, MD:  Procedure days: Monday and Wednesday 7:30 AM to 4:00 PM ____________________________________________________________________________________________ Dennis Bast were given one prescription for Methocarbamol and one for Hydrocodone today.

## 2018-08-10 NOTE — Progress Notes (Signed)
Subjective:    Patient ID: Karen Edwards, female    DOB: Jul 29, 1966, 52 y.o.   MRN: [id number]  HPI  Karen Edwards is a 52 year old female who presents today with a chief complaint of multiple nevi.   She has numerous nevi to her body, most recently noticed new nevi to her anterior chest between her breasts several months ago. She denies changes in color, size, shape. She has noticed dry skin over the nevi and has been scratching.   Review of Systems  Skin: Negative for color change.       Nevi       Past Medical History:  Diagnosis Date  . Anxiety   . Chronic back pain   . Degenerative joint disease   . Depression   . Dyspnea   . Hypotension   . IBS (irritable bowel syndrome) 05/2015  . MVP (mitral valve prolapse)      Social History   Socioeconomic History  . Marital status: Married    Spouse name: Not on file  . Number of children: 2  . Years of education: Not on file  . Highest education level: Not on file  Occupational History  . Occupation: disabled  Social Needs  . Financial resource strain: Not hard at all  . Food insecurity:    Worry: Patient refused    Inability: Patient refused  . Transportation needs:    Medical: Patient refused    Non-medical: Patient refused  Tobacco Use  . Smoking status: Current Every Day Smoker    Packs/day: 0.50    Years: 37.00    Pack years: 18.50    Types: Cigarettes  . Smokeless tobacco: Never Used  Substance and Sexual Activity  . Alcohol use: Yes    Alcohol/week: 1.0 standard drinks    Types: 1 Cans of beer per week    Comment: drinks an occasional beer  . Drug use: No  . Sexual activity: Yes    Birth control/protection: None, Surgical    Comment: tubal ligation  Lifestyle  . Physical activity:    Days per week: Patient refused    Minutes per session: Patient refused  . Stress: Not at all  Relationships  . Social connections:    Talks on phone: Patient refused    Gets together: Patient refused    Attends  religious service: Patient refused    Active member of club or organization: Patient refused    Attends meetings of clubs or organizations: Patient refused    Relationship status: Patient refused  . Intimate partner violence:    Fear of current or ex partner: Patient refused    Emotionally abused: Patient refused    Physically abused: Patient refused    Forced sexual activity: Patient refused  Other Topics Concern  . Not on file  Social History Narrative   Married.   2 children. One boy and one girl.   On disability.   Playing with her dogs, gardening.    Past Surgical History:  Procedure Laterality Date  . ANKLE SURGERY Right 03/02/2014   repair of tendon and sural nerve right ankle  . CERVICAL SPINE SURGERY  02/21/2016   arthrodesis of anterior cervical spine (fusion of C5-6,C6-7 and insertion of bone allograft  . CESAREAN SECTION    . CESAREAN SECTION WITH BILATERAL TUBAL LIGATION  1990  . COLONOSCOPY  04/19/2015  . COLONOSCOPY WITH PROPOFOL N/A 05/18/2018   Procedure: COLONOSCOPY WITH PROPOFOL;  Surgeon: Lin Landsman, MD;  Location: [facility];  Service: Gastroenterology;  Laterality: N/A;  . ESOPHAGOGASTRODUODENOSCOPY (EGD) WITH PROPOFOL N/A 05/18/2018   Procedure: ESOPHAGOGASTRODUODENOSCOPY (EGD) WITH PROPOFOL;  Surgeon: Lin Landsman, MD;  Location: [facility];  Service: Gastroenterology;  Laterality: N/A;  . HEMORRHOID SURGERY N/A 06/16/2015   Procedure: HEMORRHOIDECTOMY;  Surgeon: Leonie Green, MD;  Location: [facility] ORS;  Service: General;  Laterality: N/A;  . HYSTEROSCOPY W/D&C  12/24/2013  . SPINAL CORD STIMULATOR INSERTION N/A 06/18/2018   Procedure: LUMBAR SPINAL CORD STIMULATOR INSERTION;  Surgeon: Milinda Pointer, MD;  Location: [facility] ORS;  Service: Neurosurgery;  Laterality: N/A;    Family History  Problem Relation Age of Onset  . Arthritis Mother   . Depression Mother   . Hyperlipidemia Mother   . Hypertension Mother   . Kidney  disease Mother   . Vision loss Mother   . Hypothyroidism Mother   . Hearing loss Father   . Hyperlipidemia Father   . Hypertension Father   . Diabetes Mellitus I Maternal Grandmother   . Stroke Maternal Grandmother   . Congestive Heart Failure Maternal Grandfather   . Heart attack Maternal Grandfather   . Prostate cancer Paternal Grandfather   . Thyroid disease Maternal Aunt   . Diabetes Maternal Aunt   . Gout Maternal Aunt     Allergies  Allergen Reactions  . No Known Allergies     Current Outpatient Medications on File Prior to Visit  Medication Sig Dispense Refill  . atorvastatin (LIPITOR) 10 MG tablet Take 1 tablet (10 mg total) by mouth daily. (Patient taking differently: Take 10 mg by mouth at bedtime. ) 90 tablet 3  . Cyanocobalamin (B-12) 500 MCG TABS Take 1,000 mcg by mouth daily.    Marland Kitchen FLUoxetine (PROZAC) 20 MG capsule Take 3 capsules (60 mg total) by mouth daily. 270 capsule 3  . methocarbamol (ROBAXIN) 500 MG tablet TAKE 1 TABLET BY MOUTH EVERY 8 HOURS AS NEEDED FOR MUSCLE SPASM (Patient taking differently: Take 1,000 mg by mouth at bedtime. ) 90 tablet 1  . Potassium 99 MG TABS Take 198 mg by mouth daily.     . traZODone (DESYREL) 100 MG tablet Take 3 tablets (300 mg total) by mouth at bedtime. 270 tablet 3  . HYDROcodone-acetaminophen (NORCO/VICODIN) 5-325 MG tablet Take 1 tablet by mouth 2 (two) times daily. (Patient taking differently: Take 1 tablet by mouth 2 (two) times daily as needed (for pain.). ) 60 tablet 0  . HYDROcodone-acetaminophen (NORCO/VICODIN) 5-325 MG tablet Take 1 tablet by mouth every 6 (six) hours as needed for up to 7 days for severe pain. 28 tablet 0  . HYDROcodone-acetaminophen (NORCO/VICODIN) 5-325 MG tablet Take 1 tablet by mouth every 6 (six) hours as needed for up to 7 days for severe pain. 28 tablet 0   No current facility-administered medications on file prior to visit.     BP 126/74   Pulse 97   Temp 98 F (36.7 C) (Oral)   Ht 5'  (1.524 m)   Wt 141 lb 4 oz (64.1 kg)   LMP 05/05/2017 (Exact Date)   SpO2 97%   BMI 27.59 kg/m  '  Objective:   Physical Exam  Constitutional: She appears well-nourished.  Cardiovascular: Normal rate and regular rhythm.  Respiratory: Effort normal and breath sounds normal.  Skin: Skin is warm and dry.  Multiple nevi to anterior chest, flesh and dark brown. Circular. Non tender. No skin breakdown.  Assessment & Plan:

## 2018-08-10 NOTE — Addendum Note (Signed)
Addended by: Jacqualin Combes on: 08/10/2018 01:08 PM   Modules accepted: Orders

## 2018-08-10 NOTE — Patient Instructions (Signed)
You will be contacted regarding your referral to Dermatology.  Please let us know if you have not been contacted within one week.   Please call me if [facility] does not have the extra refills on file.  It was a pleasure to see you today!

## 2018-08-10 NOTE — Assessment & Plan Note (Signed)
Noted to anterior and posterior trunk. Current tobacco user and frequent sun exposure without protection. Nevi today do not appear suspicious, however, given history will send to dermatology for body check.

## 2018-09-17 ENCOUNTER — Encounter: Payer: Self-pay | Admitting: Pain Medicine

## 2018-09-17 ENCOUNTER — Ambulatory Visit (HOSPITAL_BASED_OUTPATIENT_CLINIC_OR_DEPARTMENT_OTHER): Payer: PPO | Admitting: Pain Medicine

## 2018-09-17 ENCOUNTER — Other Ambulatory Visit: Payer: Self-pay

## 2018-09-17 ENCOUNTER — Ambulatory Visit
Admission: RE | Admit: 2018-09-17 | Discharge: 2018-09-17 | Disposition: A | Discharge status: Home / Self Care | Payer: PPO | Source: Ambulatory Visit | Attending: Pain Medicine | Admitting: Pain Medicine

## 2018-09-17 VITALS — BP 140/70 | HR 66 | Temp 98.2°F | Resp 12 | Ht 60.0 in | Wt 140.0 lb

## 2018-09-17 DIAGNOSIS — Z9889 Other specified postprocedural states: Secondary | ICD-10-CM

## 2018-09-17 DIAGNOSIS — M542 Cervicalgia: Secondary | ICD-10-CM

## 2018-09-17 DIAGNOSIS — M47812 Spondylosis without myelopathy or radiculopathy, cervical region: Secondary | ICD-10-CM

## 2018-09-17 DIAGNOSIS — G8929 Other chronic pain: Secondary | ICD-10-CM | POA: Diagnosis not present

## 2018-09-17 DIAGNOSIS — G8928 Other chronic postprocedural pain: Secondary | ICD-10-CM | POA: Insufficient documentation

## 2018-09-17 DIAGNOSIS — Z79891 Long term (current) use of opiate analgesic: Secondary | ICD-10-CM | POA: Insufficient documentation

## 2018-09-17 DIAGNOSIS — F419 Anxiety disorder, unspecified: Secondary | ICD-10-CM | POA: Insufficient documentation

## 2018-09-17 DIAGNOSIS — G8918 Other acute postprocedural pain: Secondary | ICD-10-CM

## 2018-09-17 DIAGNOSIS — Z79899 Other long term (current) drug therapy: Secondary | ICD-10-CM | POA: Diagnosis not present

## 2018-09-17 MED ORDER — DEXAMETHASONE SODIUM PHOSPHATE 10 MG/ML IJ SOLN
10.0000 mg | Freq: Once | INTRAMUSCULAR | Status: AC
  Administered 2018-09-17: 10 mg
  Filled 2018-09-17: qty 1

## 2018-09-17 MED ORDER — OXYCODONE-ACETAMINOPHEN 5-325 MG PO TABS: 1.0000 | ORAL_TABLET | Freq: Three times a day (TID) | ORAL | 0 refills | Status: AC | PRN

## 2018-09-17 MED ORDER — ROPIVACAINE HCL 2 MG/ML IJ SOLN
9.0000 mL | Freq: Once | INTRAMUSCULAR | Status: AC
  Administered 2018-09-17: 9 mL via PERINEURAL
  Filled 2018-09-17: qty 10

## 2018-09-17 MED ORDER — LIDOCAINE HCL 2 % IJ SOLN
20.0000 mL | Freq: Once | INTRAMUSCULAR | Status: AC
  Administered 2018-09-17: 400 mg

## 2018-09-17 MED ORDER — FENTANYL CITRATE (PF) 100 MCG/2ML IJ SOLN
25.0000 ug | INTRAMUSCULAR | Status: DC | PRN
  Administered 2018-09-17: 75 ug via INTRAVENOUS
  Filled 2018-09-17: qty 2

## 2018-09-17 MED ORDER — LACTATED RINGERS IV SOLN
1000.0000 mL | Freq: Once | INTRAVENOUS | Status: AC
  Administered 2018-09-17: 1000 mL via INTRAVENOUS

## 2018-09-17 MED ORDER — MIDAZOLAM HCL 5 MG/5ML IJ SOLN
1.0000 mg | INTRAMUSCULAR | Status: DC | PRN
  Administered 2018-09-17: 1.5 mg via INTRAVENOUS
  Filled 2018-09-17: qty 5

## 2018-09-17 NOTE — Patient Instructions (Addendum)
___________________________________________________________________________________________  Post-Radiofrequency (RF) Discharge Instructions  You have just completed a Radiofrequency Neurotomy.  The following instructions will provide you with information and guidelines for self-care upon discharge.  If at any time you have questions or concerns please call your physician. DO NOT DRIVE YOURSELF!!  Instructions:  Apply ice: Fill a plastic sandwich bag with crushed ice. Cover it with a small towel and apply to injection site. Apply for 15 minutes then remove x 15 minutes. Repeat sequence on day of procedure, until you go to bed. The purpose is to minimize swelling and discomfort after procedure.  Apply heat: Apply heat to procedure site starting the day following the procedure. The purpose is to treat any soreness and discomfort from the procedure.  Food intake: No eating limitations, unless stipulated above.  Nevertheless, if you have had sedation, you may experience some nausea.  In this case, it may be wise to wait at least two hours prior to resuming regular diet.  Physical activities: Keep activities to a minimum for the first 8 hours after the procedure. For the first 24 hours after the procedure, do not drive a motor vehicle,  Operate heavy machinery, power tools, or handle any weapons.  Consider walking with the use of an assistive device or accompanied by an adult for the first 24 hours.  Do not drink alcoholic beverages including beer.  Do not make any important decisions or sign any legal documents. Go home and rest today.  Resume activities tomorrow, as tolerated.  Use caution in moving about as you may experience mild leg weakness.  Use caution in cooking, use of household electrical appliances and climbing steps.  Driving: If you have received any sedation, you are not allowed to drive for 24 hours after your procedure.  Blood thinner: Restart your blood thinner 6 hours after your  procedure. (Only for those taking blood thinners)  Insulin: As soon as you can eat, you may resume your normal dosing schedule. (Only for those taking insulin)  Medications: May resume pre-procedure medications.  Do not take any drugs, other than what has been prescribed to you.  Infection prevention: Keep procedure site clean and dry.  Post-procedure Pain Diary: Extremely important that this be done correctly and accurately. Recorded information will be used to determine the next step in treatment.  Pain evaluated is that of treated area only. Do not include pain from an untreated area.  Complete every hour, on the hour, for the initial 8 hours. Set an alarm to help you do this part accurately.  Do not go to sleep and have it completed later. It will not be accurate.  Follow-up appointment: Keep your follow-up appointment after the procedure. Usually 2 weeks for most procedures. (6 weeks in the case of radiofrequency.) Bring you pain diary.   Expect:  From numbing medicine (AKA: Local Anesthetics): Numbness or decrease in pain.  Onset: Full effect within 15 minutes of injected.  Duration: It will depend on the type of local anesthetic used. On the average, 1 to 8 hours.   From steroids: Decrease in swelling or inflammation. Once inflammation is improved, relief of the pain will follow.  Onset of benefits: Depends on the amount of swelling present. The more swelling, the longer it will take for the benefits to be seen. In some cases, up to 10 days.  Duration: Steroids will stay in the system x 2 weeks. Duration of benefits will depend on multiple posibilities including persistent irritating factors.  From procedure: Some   discomfort is to be expected once the numbing medicine wears off. This should be minimal if ice and heat are applied as instructed.  Call if:  You experience numbness and weakness that gets worse with time, as opposed to wearing off.  He experience any unusual  bleeding, difficulty breathing, or loss of the ability to control your bowel and bladder. (This applies to Spinal procedures only)  You experience any redness, swelling, heat, red streaks, elevated temperature, fever, or any other signs of a possible infection.  Emergency Numbers:  Durning business hours (Monday - Thursday, 8:00 AM - 4:00 PM) (Friday, 9:00 AM - 12:00 Noon): [phone]  After hours: [phone] ____________________________________________________________________________________________   ____________________________________________________________________________________________  Preparing for Procedure with Sedation  Instructions: . Oral Intake: Do not eat or drink anything for at least 8 hours prior to your procedure. . Transportation: Public transportation is not allowed. Bring an adult driver. The driver must be physically present in our waiting room before any procedure can be started. . Physical Assistance: Bring an adult physically capable of assisting you, in the event you need help. This adult should keep you company at home for at least 6 hours after the procedure. . Blood Pressure Medicine: Take your blood pressure medicine with a sip of water the morning of the procedure. . Blood thinners: Notify our staff if you are taking any blood thinners. Depending on which one you take, there will be specific instructions on how and when to stop it. . Diabetics on insulin: Notify the staff so that you can be scheduled 1st case in the morning. If your diabetes requires high dose insulin, take only  of your normal insulin dose the morning of the procedure and notify the staff that you have done so. . Preventing infections: Shower with an antibacterial soap the morning of your procedure. . Build-up your immune system: Take 1000 mg of Vitamin C with every meal (3 times a day) the day prior to your procedure. . Antibiotics: Inform the staff if you have a condition or  reason that requires you to take antibiotics before dental procedures. . Pregnancy: If you are pregnant, call and cancel the procedure. . Sickness: If you have a cold, fever, or any active infections, call and cancel the procedure. . Arrival: You must be in the facility at least 30 minutes prior to your scheduled procedure. . Children: Do not bring children with you. . Dress appropriately: Bring dark clothing that you would not mind if they get stained. . Valuables: Do not bring any jewelry or valuables.  Procedure appointments are reserved for interventional treatments only. . No Prescription Refills. . No medication changes will be discussed during procedure appointments. . No disability issues will be discussed.  Reasons to call and reschedule or cancel your procedure: (Following these recommendations will minimize the risk of a serious complication.) . Surgeries: Avoid having procedures within 2 weeks of any surgery. (Avoid for 2 weeks before or after any surgery). . Flu Shots: Avoid having procedures within 2 weeks of a flu shots or . (Avoid for 2 weeks before or after immunizations). . Barium: Avoid having a procedure within 7-10 days after having had a radiological study involving the use of radiological contrast. (Myelograms, Barium swallow or enema study). . Heart attacks: Avoid any elective procedures or surgeries for the initial 6 months after a "Myocardial Infarction" (Heart Attack). . Blood thinners: It is imperative that you stop these medications before procedures. Let us know if you if you   take any blood thinner.  . Infection: Avoid procedures during or within two weeks of an infection (including chest colds or gastrointestinal problems). Symptoms associated with infections include: Localized redness, fever, chills, night sweats or profuse sweating, burning sensation when voiding, cough, congestion, stuffiness, runny nose, sore throat, diarrhea, nausea, vomiting, cold or Flu  symptoms, recent or current infections. It is specially important if the infection is over the area that we intend to treat. Marland Kitchen Heart and lung problems: Symptoms that may suggest an active cardiopulmonary problem include: cough, chest pain, breathing difficulties or shortness of breath, dizziness, ankle swelling, uncontrolled high or unusually low blood pressure, and/or palpitations. If you are experiencing any of these symptoms, cancel your procedure and contact your primary care physician for an evaluation.  Remember:  Regular Business hours are:  Monday to Thursday 8:00 AM to 4:00 PM  Provider's Schedule: Milinda Pointer, MD:  Procedure days: Tuesday and Thursday 7:30 AM to 4:00 PM  Gillis Santa, MD:  Procedure days: Monday and Wednesday 7:30 AM to 4:00 PM ____________________________________________________________________________________________   Oxycodone - apap 5-325 mg x 14 days to fill on 09/17/18 and 09/24/18 escribed to pharmacy

## 2018-09-17 NOTE — Progress Notes (Signed)
Patient's Name: Karen Edwards  MRN: [id number]  Referring Provider: Pleas Koch, NP  DOB: December 15, 1965  PCP: Pleas Koch, NP  DOS: 09/17/2018  Note by: Gaspar Cola, MD  Service setting: Ambulatory outpatient  Specialty: Interventional Pain Management  Patient type: Established  Location: [facility] (AMB) Pain Management Facility  Visit type: Interventional Procedure   Primary Reason for Visit: Interventional Pain Management Treatment. CC: Neck Pain  Procedure:          Anesthesia, Analgesia, Anxiolysis:  Type: Cervical Facet, Medial Branch Radiofrequency Ablation  #1  Primary Purpose: Therapeutic Region: Posterolateral cervical spine region Level: C3, C4, C5, C6, & C7 Medial Branch Level(s). Lesioning of these levels should completely denervate the C3-4, C4-5, C5-6, and the C6-7 cervical facet joints. Laterality: Right Paraspinal  Type: Moderate (Conscious) Sedation combined with Local Anesthesia Indication(s): Analgesia and Anxiety Route: Intravenous (IV) IV Access: Secured Sedation: Meaningful verbal contact was maintained at all times during the procedure  Local Anesthetic: Lidocaine 1-2%  Position: Prone with head of the table was raised to facilitate breathing.   Indications: 1. Spondylosis without myelopathy or radiculopathy, cervical region   2. Cervicalgia   3. 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 Chronic neck pain with history of cervical spinal surgery    Karen Edwards has been dealing with the above chronic pain for longer than three months and has either failed to respond, was unable to tolerate, or simply did not get enough benefit from other more conservative therapies including, but not limited to: 1. Over-the-counter medications 2. Anti-inflammatory medications 3. Muscle relaxants 4. Membrane stabilizers 5. Opioids 6. Physical therapy and/or chiropractic manipulation 7. Modalities (Heat, ice, etc.) 8. Invasive techniques such as nerve  blocks. Ms. Bottoms has attained more than 50% relief of the pain from a series of diagnostic injections conducted in separate occasions.  Pain Score: Pre-procedure: 4 /10 Post-procedure: 0-No pain/10  Pre-op Assessment:  Karen Edwards is a 52 y.o. (year old), female patient, seen today for interventional treatment. She  has a past surgical history that includes Cesarean section; Ankle surgery (Right, 03/02/2014); Hemorrhoid surgery (N/A, 06/16/2015); Cervical spine surgery (02/21/2016); Cesarean section with bilateral tubal ligation (1990); Colonoscopy (04/19/2015); Hysteroscopy w/D&C (12/24/2013); Colonoscopy with propofol (N/A, 05/18/2018); Esophagogastroduodenoscopy (egd) with propofol (N/A, 05/18/2018); and Spinal cord stimulator insertion (N/A, 06/18/2018). [name] has a current medication list which includes the following prescription(s): atorvastatin, b-12, fluoxetine, hydrocodone-acetaminophen, methocarbamol, potassium, oxycodone-acetaminophen, oxycodone-acetaminophen, and trazodone, and the following Facility-Administered Medications: fentanyl and midazolam. Her primarily concern today is the Neck Pain  Initial Vital Signs:  Pulse/HCG Rate: 66ECG Heart Rate: (!) 51 Temp: 98.6 F (37 C) Resp: 18 BP: 113/71 SpO2: 99 %  BMI: Estimated body mass index is 27.34 kg/m as calculated from the following:   Height as of this encounter: 5' (1.524 m).   Weight as of this encounter: 140 lb (63.5 kg).  Risk Assessment: Allergies: Reviewed. She is allergic to no known allergies.  Allergy Precautions: None required Coagulopathies: Reviewed. None identified.  Blood-thinner therapy: None at this time Active Infection(s): Reviewed. None identified. Karen Edwards is afebrile  Site Confirmation: Karen Edwards was asked to confirm the procedure and laterality before marking the site Procedure checklist: Completed Consent: Before the procedure and under the influence of no sedative(s), amnesic(s),  or anxiolytics, the patient was informed of the treatment options, risks and possible complications. To fulfill our ethical and legal obligations, as recommended by the American Medical Association's Code of Ethics, I have  informed the patient of my clinical impression; the nature and purpose of the treatment or procedure; the risks, benefits, and possible complications of the intervention; the alternatives, including doing nothing; the risk(s) and benefit(s) of the alternative treatment(s) or procedure(s); and the risk(s) and benefit(s) of doing nothing. The patient was provided information about the general risks and possible complications associated with the procedure. These may include, but are not limited to: failure to achieve desired goals, infection, bleeding, organ or nerve damage, allergic reactions, paralysis, and death. In addition, the patient was informed of those risks and complications associated to Spine-related procedures, such as failure to decrease pain; infection (i.e.: Meningitis, epidural or intraspinal abscess); bleeding (i.e.: epidural hematoma, subarachnoid hemorrhage, or any other type of intraspinal or peri-dural bleeding); organ or nerve damage (i.e.: Any type of peripheral nerve, nerve root, or spinal cord injury) with subsequent damage to sensory, motor, and/or autonomic systems, resulting in permanent pain, numbness, and/or weakness of one or several areas of the body; allergic reactions; (i.e.: anaphylactic reaction); and/or death. Furthermore, the patient was informed of those risks and complications associated with the medications. These include, but are not limited to: allergic reactions (i.e.: anaphylactic or anaphylactoid reaction(s)); adrenal axis suppression; blood sugar elevation that in diabetics may result in ketoacidosis or comma; water retention that in patients with history of congestive heart failure may result in shortness of breath, pulmonary edema, and  decompensation with resultant heart failure; weight gain; swelling or edema; medication-induced neural toxicity; particulate matter embolism and blood vessel occlusion with resultant organ, and/or nervous system infarction; and/or aseptic necrosis of one or more joints. Finally, the patient was informed that Medicine is not an exact science; therefore, there is also the possibility of unforeseen or unpredictable risks and/or possible complications that may result in a catastrophic outcome. The patient indicated having understood very clearly. We have given the patient no guarantees and we have made no promises. Enough time was given to the patient to ask questions, all of which were answered to the patient's satisfaction. Ms. Vanderlinden has indicated that she wanted to continue with the procedure. Attestation: I, the ordering provider, attest that I have discussed with the patient the benefits, risks, side-effects, alternatives, likelihood of achieving goals, and potential problems during recovery for the procedure that I have provided informed consent. Date  Time: 09/17/2018  2:19 PM  Pre-Procedure Preparation:  Monitoring: As per clinic protocol. Respiration, ETCO2, SpO2, BP, heart rate and rhythm monitor placed and checked for adequate function Safety Precautions: Patient was assessed for positional comfort and pressure points before starting the procedure. Time-out: I initiated and conducted the "Time-out" before starting the procedure, as per protocol. The patient was asked to participate by confirming the accuracy of the "Time Out" information. Verification of the correct person, site, and procedure were performed and confirmed by me, the nursing staff, and the patient. "Time-out" conducted as per Joint Commission's Universal Protocol (UP.01.01.01). Time: 1512  Description of Procedure:          Laterality: Right Level: C3, C4, C5, C6, & C7 Medial Branch Level(s). Area Prepped: Entire Posterior  Cervico-thoracic Region Prepping solution: ChloraPrep (2% chlorhexidine gluconate and 70% isopropyl alcohol) Safety Precautions: Aspiration looking for blood return was conducted prior to all injections. At no point did we inject any substances, as a needle was being advanced. Before injecting, the patient was told to immediately notify me if she was experiencing any new onset of "ringing in the ears, or metallic taste in the mouth".  No attempts were made at seeking any paresthesias. Safe injection practices and needle disposal techniques used. Medications properly checked for expiration dates. SDV (single dose vial) medications used. After the completion of the procedure, all disposable equipment used was discarded in the proper designated medical waste containers. Local Anesthesia: Protocol guidelines were followed. The patient was positioned over the fluoroscopy table. The area was prepped in the usual manner. The time-out was completed. The target area was identified using fluoroscopy. A 12-in long, straight, sterile hemostat was used with fluoroscopic guidance to locate the targets for each level blocked. Once located, the skin was marked with an approved surgical skin marker. Once all sites were marked, the skin (epidermis, dermis, and hypodermis), as well as deeper tissues (fat, connective tissue and muscle) were infiltrated with a small amount of a short-acting local anesthetic, loaded on a 10cc syringe with a 25G, 1.5-in  Needle. An appropriate amount of time was allowed for local anesthetics to take effect before proceeding to the next step. Local Anesthetic: Lidocaine 2.0% The unused portion of the local anesthetic was discarded in the proper designated containers. Technical explanation of process:  Radiofrequency Ablation (RFA) C3 Medial Branch Nerve RFA: The target area for the C3 dorsal medial articular branch is the lateral concave waist of the articular pillar of C3. Under fluoroscopic  guidance, a Radiofrequency needle was inserted until contact was made with os over the postero-lateral aspect of the articular pillar of C3 (target area). Sensory and motor testing was conducted to properly adjust the position of the needle. Once satisfactory placement of the needle was achieved, the numbing solution was slowly injected after negative aspiration for blood. 2.0 mL of the nerve block solution was injected without difficulty or complication. After waiting for at least 3 minutes, the ablation was performed. Once completed, the needle was removed intact. C4 Medial Branch Nerve RFA: The target area for the C4 dorsal medial articular branch is the lateral concave waist of the articular pillar of C4. Under fluoroscopic guidance, a Radiofrequency needle was inserted until contact was made with os over the postero-lateral aspect of the articular pillar of C4 (target area). Sensory and motor testing was conducted to properly adjust the position of the needle. Once satisfactory placement of the needle was achieved, the numbing solution was slowly injected after negative aspiration for blood. 2.0 mL of the nerve block solution was injected without difficulty or complication. After waiting for at least 3 minutes, the ablation was performed. Once completed, the needle was removed intact. C5 Medial Branch Nerve RFA: The target area for the C5 dorsal medial articular branch is the lateral concave waist of the articular pillar of C5. Under fluoroscopic guidance, a Radiofrequency needle was inserted until contact was made with os over the postero-lateral aspect of the articular pillar of C5 (target area). Sensory and motor testing was conducted to properly adjust the position of the needle. Once satisfactory placement of the needle was achieved, the numbing solution was slowly injected after negative aspiration for blood. 2.0 mL of the nerve block solution was injected without difficulty or complication. After  waiting for at least 3 minutes, the ablation was performed. Once completed, the needle was removed intact. C6 Medial Branch Nerve RFA: The target area for the C6 dorsal medial articular branch is the lateral concave waist of the articular pillar of C6. Under fluoroscopic guidance, a Radiofrequency needle was inserted until contact was made with os over the postero-lateral aspect of the articular pillar of C6 (target  area). Sensory and motor testing was conducted to properly adjust the position of the needle. Once satisfactory placement of the needle was achieved, the numbing solution was slowly injected after negative aspiration for blood. 2.0 mL of the nerve block solution was injected without difficulty or complication. After waiting for at least 3 minutes, the ablation was performed. Once completed, the needle was removed intact. C7 Medial Branch Nerve RFA: The target for the C7 dorsal medial articular branch lies on the superior-medial tip of the C7 transverse process. Under fluoroscopic guidance, a Radiofrequency needle was inserted until contact was made with os over the postero-lateral aspect of the articular pillar of C7 (target area). Sensory and motor testing was conducted to properly adjust the position of the needle. Once satisfactory placement of the needle was achieved, the numbing solution was slowly injected after negative aspiration for blood. 2.0 mL of the nerve block solution was injected without difficulty or complication. After waiting for at least 3 minutes, the ablation was performed. Once completed, the needle was removed intact. Radiofrequency lesioning (ablation):  Radiofrequency Generator: NeuroTherm [id number] Sensory Stimulation Parameters: 50 Hz was used to locate & identify the nerve, making sure that the needle was positioned such that there was no sensory stimulation below 0.3 V or above 0.7 V. Motor Stimulation Parameters: 2 Hz was used to evaluate the motor component. Care was  taken not to lesion any nerves that demonstrated motor stimulation of the lower extremities at an output of less than 2.5 times that of the sensory threshold, or a maximum of 2.0 V. Lesioning Technique Parameters: Standard Radiofrequency settings. (Not bipolar or pulsed.) Temperature Settings: 80 degrees C Lesioning time: 60 seconds Intra-operative Compliance: Compliant Materials & Medications: Needle(s) (Electrode/Cannula) Type: Teflon-coated, curved tip, Radiofrequency needle(s) Gauge: 22G Length: 10cm Numbing solution: 0.2% PF-Ropivacaine + Triamcinolone (40 mg/mL) diluted to a final concentration of 4 mg of Triamcinolone/mL of Ropivacaine The unused portion of the solution was discarded in the proper designated containers.  Once the entire procedure was completed, the treated area was cleaned, making sure to leave some of the prepping solution back to take advantage of its long term bactericidal properties.  Intra-operative Compliance: Compliant  Vitals:   09/17/18 1542 09/17/18 1552 09/17/18 1602 09/17/18 1612  BP: 124/78 117/78 127/63 140/70  Pulse:      Resp: 17 16 16 12   Temp:  98.2 F (36.8 C)    TempSrc:  Temporal    SpO2: 95% 98% 97% 99%  Weight:      Height:        Start Time: 1512 hrs. End Time: 1542 hrs.  Imaging Guidance (Spinal):          Type of Imaging Technique: Fluoroscopy Guidance (Spinal) Indication(s): Assistance in needle guidance and placement for procedures requiring needle placement in or near specific anatomical locations not easily accessible without such assistance. Exposure Time: Please see nurses notes. Contrast: None used. Fluoroscopic Guidance: I was personally present during the use of fluoroscopy. "Tunnel Vision Technique" used to obtain the best possible view of the target area. Parallax error corrected before commencing the procedure. "Direction-depth-direction" technique used to introduce the needle under continuous pulsed fluoroscopy. Once  target was reached, antero-posterior, oblique, and lateral fluoroscopic projection used confirm needle placement in all planes. Images permanently stored in EMR. Interpretation: No contrast injected. I personally interpreted the imaging intraoperatively. Adequate needle placement confirmed in multiple planes. Permanent images saved into the patient's record.  Antibiotic Prophylaxis:   Anti-infectives (From admission, onward)  None     Indication(s): None identified  Post-operative Assessment:  Post-procedure Vital Signs:  Pulse/HCG Rate: 66(!) 52 Temp: 98.2 F (36.8 C) Resp: 12 BP: 140/70 SpO2: 99 %  EBL: None  Complications: No immediate post-treatment complications observed by team, or reported by patient.  Note: The patient tolerated the entire procedure well. A repeat set of vitals were taken after the procedure and the patient was kept under observation following institutional policy, for this type of procedure. Post-procedural neurological assessment was performed, showing return to baseline, prior to discharge. The patient was provided with post-procedure discharge instructions, including a section on how to identify potential problems. Should any problems arise concerning this procedure, the patient was given instructions to immediately contact us, at any time, without hesitation. In any case, we plan to contact the patient by telephone for a follow-up status report regarding this interventional procedure.  Comments:  No additional relevant information.  Plan of Care    Imaging Orders     DG C-Arm 1-60 Min-No Report  Procedure Orders     Radiofrequency,Cervical     Radiofrequency,Cervical  Medications ordered for procedure: Meds ordered this encounter  Medications  . lidocaine (XYLOCAINE) 2 % (with pres) injection 400 mg  . midazolam (VERSED) 5 MG/5ML injection 1-2 mg    Make sure Flumazenil is available in the pyxis when using this medication. If oversedation  occurs, administer 0.2 mg IV over 15 sec. If after 45 sec no response, administer 0.2 mg again over 1 min; may repeat at 1 min intervals; not to exceed 4 doses (1 mg)  . fentaNYL (SUBLIMAZE) injection 25-50 mcg    Make sure Narcan is available in the pyxis when using this medication. In the event of respiratory depression (RR< 8/min): Titrate NARCAN (naloxone) in increments of 0.1 to 0.2 mg IV at 2-3 minute intervals, until desired degree of reversal.  . lactated ringers infusion 1,000 mL  . ropivacaine (PF) 2 mg/mL (0.2%) (NAROPIN) injection 9 mL  . dexamethasone (DECADRON) injection 10 mg  . oxyCODONE-acetaminophen (PERCOCET) 5-325 MG tablet    Sig: Take 1 tablet by mouth every 8 (eight) hours as needed for up to 7 days for severe pain. Must last 7 days.    Dispense:  21 tablet    Refill:  0    For acute post-operative pain. Not to be refilled.  Must last 7 days.  Marland Kitchen oxyCODONE-acetaminophen (PERCOCET) 5-325 MG tablet    Sig: Take 1 tablet by mouth every 8 (eight) hours as needed for up to 7 days for severe pain. Must last 7 days.    Dispense:  21 tablet    Refill:  0    For acute post-operative pain. Not to be refilled.  Must last 7 days.   Medications administered: We administered lidocaine, midazolam, fentaNYL, lactated ringers, ropivacaine (PF) 2 mg/mL (0.2%), and dexamethasone.  See the medical record for exact dosing, route, and time of administration.  Disposition: Discharge home  Discharge Date & Time: 09/17/2018; 1615 hrs.   Physician-requested Follow-up: Return for contralateral RFA (2 wks): (L) C-FCT RFA #1.  Future Appointments  Date Time Provider [place]  11/05/2018 10:30 AM Milinda Pointer, MD [facility] None   Primary Care Physician: Pleas Koch, NP Location: [facility] Note by: Gaspar Cola, MD Date: 09/17/2018; Time: 4:26 PM  Disclaimer:  Medicine is not an Chief Strategy Officer. The only guarantee in medicine is that  nothing is guaranteed. It is important to note  that the decision to proceed with this intervention was based on the information collected from the patient. The Data and conclusions were drawn from the patient's questionnaire, the interview, and the physical examination. Because the information was provided in large part by the patient, it cannot be guaranteed that it has not been purposely or unconsciously manipulated. Every effort has been made to obtain as much relevant data as possible for this evaluation. It is important to note that the conclusions that lead to this procedure are derived in large part from the available data. Always take into account that the treatment will also be dependent on availability of resources and existing treatment guidelines, considered by other Pain Management Practitioners as being common knowledge and practice, at the time of the intervention. For Medico-Legal purposes, it is also important to point out that variation in procedural techniques and pharmacological choices are the acceptable norm. The indications, contraindications, technique, and results of the above procedure should only be interpreted and judged by a Board-Certified Interventional Pain Specialist with extensive familiarity and expertise in the same exact procedure and technique.

## 2018-09-17 NOTE — Progress Notes (Signed)
Safety precautions to be maintained throughout the outpatient stay will include: orient to surroundings, keep bed in low position, maintain call bell within reach at all times, provide assistance with transfer out of bed and ambulation.  

## 2018-09-18 ENCOUNTER — Telehealth: Payer: Self-pay

## 2018-09-18 NOTE — Telephone Encounter (Signed)
Post procedure phone call.  Patient states she is doing well.  

## 2018-09-28 ENCOUNTER — Telehealth: Payer: Self-pay | Admitting: Pain Medicine

## 2018-09-28 NOTE — Telephone Encounter (Signed)
Called patient back to discuss her issue.  States that after the procedure she was pretty numb and it wasn't til about the 2nd day. States that are certain ways that she turns her head and and the pain is sharp.  Numbness in the face but no drooping of eyes or mouth on the right.  Soreness when she combs or brushes her hair.  States that she has been very ill, I told her that could be coming from the steroid.  Also, I told her that the things that she is experiencing are most likely normal from the RF procedure that was done on 09/17/18, Cervical RF.  Told her that she will probably need to give it the full 6 weeks to let things heal.  Patient verbalizes u/o information.

## 2018-09-28 NOTE — Telephone Encounter (Signed)
Pt had procedure and stated that now she is having headaches and numbness and soreness on right side of her head so bad that she cant brush her hair without it hurting and wants to know if this is normal and what she should do.

## 2018-09-28 NOTE — Telephone Encounter (Signed)
This is reasonable and appr. Information. You can always make him aware of the symptoms since the patient did called. Thanks

## 2018-09-28 NOTE — Telephone Encounter (Signed)
ok 

## 2018-09-28 NOTE — Telephone Encounter (Signed)
call

## 2018-09-28 NOTE — Telephone Encounter (Signed)
Spoke back with patient after speaking with Dr Dossie Arbour and options for additional treatment.  I offered the patient to come in for evaluation to see if options were discussed would be of benefit to the patient.  I did ask the patient if she is taking her breakthrough pain medication, she states that not every day but when she absolutely needs it.  Patient wants to try and take her medication more often and see if this helps.  If she does not improve she will call for evaluation with Crystal.

## 2018-10-16 ENCOUNTER — Telehealth: Payer: Self-pay

## 2018-10-16 NOTE — Telephone Encounter (Signed)
Left message for patient to call back. Received a note from [facility] that patient cancelled her appointment with them on 10/15/18 and did not reschedule. Need to follow up with patient.

## 2018-10-16 NOTE — Telephone Encounter (Signed)
Spoke with patient and rescheduled appointment for a later time due to family issues she is dealing with at this time.

## 2018-11-04 ENCOUNTER — Ambulatory Visit: Payer: PPO | Admitting: Primary Care

## 2018-11-05 ENCOUNTER — Ambulatory Visit: Payer: PPO | Admitting: Pain Medicine

## 2018-11-25 ENCOUNTER — Ambulatory Visit: Payer: PPO | Admitting: Pain Medicine

## 2018-12-01 DIAGNOSIS — F418 Other specified anxiety disorders: Secondary | ICD-10-CM

## 2018-12-01 MED ORDER — DIAZEPAM 2 MG PO TABS: 2.0000 mg | ORAL_TABLET | Freq: Two times a day (BID) | ORAL | 0 refills | Status: DC | PRN

## 2018-12-01 NOTE — Telephone Encounter (Signed)
Patient was called at 8:30 am this morning to discuss symptoms, condolences were provided. We will send in treatment. Awaiting response.

## 2018-12-01 NOTE — Telephone Encounter (Signed)
Pt called office crying due to her husband passing away yesterday. She wants to know if Anda Kraft can send her in something for her nerves. Pt uses [facility] on [id number] in [place]. Please see previous mychart message.

## 2018-12-06 DIAGNOSIS — F418 Other specified anxiety disorders: Secondary | ICD-10-CM

## 2018-12-07 ENCOUNTER — Ambulatory Visit: Payer: PPO | Admitting: Pain Medicine

## 2018-12-07 MED ORDER — DIAZEPAM 2 MG PO TABS: 2.0000 mg | ORAL_TABLET | Freq: Two times a day (BID) | ORAL | 0 refills | Status: DC | PRN

## 2018-12-07 NOTE — Telephone Encounter (Signed)
Pt is requesting Karen Edwards to extend the refill on the diazepam for another 2 weeks. The funeral is not until Saturday and she says she still has a lot of other things to deal with and having to be with the pt's father this week to finish up arrangements.

## 2018-12-10 ENCOUNTER — Ambulatory Visit: Payer: PPO | Admitting: Pain Medicine

## 2018-12-18 ENCOUNTER — Other Ambulatory Visit: Payer: Self-pay | Admitting: Primary Care

## 2018-12-18 DIAGNOSIS — F329 Major depressive disorder, single episode, unspecified: Secondary | ICD-10-CM

## 2018-12-18 DIAGNOSIS — G4700 Insomnia, unspecified: Secondary | ICD-10-CM

## 2018-12-18 DIAGNOSIS — F32A Depression, unspecified: Secondary | ICD-10-CM

## 2019-01-23 ENCOUNTER — Other Ambulatory Visit: Payer: Self-pay | Admitting: Pain Medicine

## 2019-01-23 DIAGNOSIS — M7918 Myalgia, other site: Principal | ICD-10-CM

## 2019-01-23 DIAGNOSIS — G8929 Other chronic pain: Secondary | ICD-10-CM

## 2019-02-04 ENCOUNTER — Telehealth: Payer: Self-pay | Admitting: *Deleted

## 2019-02-04 NOTE — Telephone Encounter (Signed)
Attempted to call pharmacy.  Patient has cancelled her appointment for the last 4 appointments so she needs to have an appointment.  Please call and schedule for a virtual visit with Crystal.

## 2019-02-04 NOTE — Telephone Encounter (Signed)
Scheduled for Tuesday

## 2019-02-09 ENCOUNTER — Ambulatory Visit: Payer: PPO | Attending: Nurse Practitioner | Admitting: Nurse Practitioner

## 2019-02-09 ENCOUNTER — Other Ambulatory Visit: Payer: Self-pay

## 2019-02-10 ENCOUNTER — Other Ambulatory Visit: Payer: Self-pay | Admitting: Pain Medicine

## 2019-02-10 DIAGNOSIS — M7918 Myalgia, other site: Principal | ICD-10-CM

## 2019-02-10 DIAGNOSIS — G894 Chronic pain syndrome: Secondary | ICD-10-CM

## 2019-02-10 DIAGNOSIS — G8929 Other chronic pain: Secondary | ICD-10-CM

## 2019-02-22 ENCOUNTER — Encounter: Payer: Self-pay | Admitting: Pain Medicine

## 2019-02-22 ENCOUNTER — Ambulatory Visit: Payer: PPO | Admitting: Nurse Practitioner

## 2019-02-22 ENCOUNTER — Other Ambulatory Visit: Payer: Self-pay

## 2019-02-22 NOTE — Progress Notes (Deleted)
Pain Management Virtual Encounter Note - Virtual Visit via  ***  Telehealth (real-time audio visits between healthcare provider and patient).  Patient's Phone No. & Preferred Pharmacy:  [phone] (home); There is no such number on file (mobile).; (Preferred) [phone] [email or web address]  [place] [address] [place] [address] Phone: [phone] Fax: [phone]   Pre-screening note:  Our staff contacted Karen Edwards and offered Karen Edwards an "in person", "face-to-face" appointment versus a telephone encounter. She indicated preferring the telephone encounter, at this time.  Reason for Virtual Visit: COVID-19*  Social distancing based on CDC and AMA recommendations.   Karen contacted Karen Edwards on 02/24/2019 at 2:54 PM via  ***  and clearly identified myself as Gaspar Cola, MD. Karen verified that Karen was speaking with the correct person using two identifiers (Name and date of birth: 1966-10-17).  Advanced Informed Consent Karen sought verbal advanced consent from Karen Edwards for virtual visit interactions. Karen informed Karen Edwards of possible security and privacy concerns, risks, and limitations associated with providing "not-in-person" medical evaluation and management services. Karen also informed Karen Edwards of the availability of "in-person" appointments. Finally, Karen informed Karen Edwards that there would be a charge for the virtual visit and that she could be  personally, fully or partially, financially responsible for it. Karen Edwards expressed understanding and agreed to proceed.   Historic Elements   Karen Edwards is a 53 y.o. year old, female patient evaluated today after Karen Edwards last encounter by our practice on 02/10/2019. Karen Edwards  has a past medical history of Anxiety, Chronic back pain, Degenerative joint disease, Depression, Dyspnea, Hypotension, IBS (irritable bowel syndrome) (05/2015), and MVP (mitral  valve prolapse). She also  has a past surgical history that includes Cesarean section; Ankle surgery (Right, 03/02/2014); Hemorrhoid surgery (N/A, 06/16/2015); Cervical spine surgery (02/21/2016); Cesarean section with bilateral tubal ligation (1990); Colonoscopy (04/19/2015); Hysteroscopy w/D&C (12/24/2013); Colonoscopy with propofol (N/A, 05/18/2018); Esophagogastroduodenoscopy (egd) with propofol (N/A, 05/18/2018); and Spinal cord stimulator insertion (N/A, 06/18/2018). Karen Edwards has a current medication list which includes the following prescription(s): atorvastatin, b-12, diazepam, fluoxetine, hydrocodone-acetaminophen, methocarbamol, potassium, and trazodone. She  reports that she has been smoking cigarettes. She has a 18.50 pack-year smoking history. She has never used smokeless tobacco. She reports current alcohol use of about 1.0 standard drinks of alcohol per week. She reports that she does not use drugs. Karen Edwards is allergic to no known allergies.   HPI  Karen last saw Karen Edwards on 02/10/2019. She is being evaluated for  ***   Post-Procedure Evaluation  Procedure: *** Pre-procedure pain level:   *** /10 Post-procedure:  *** /10          Sedation: Please see nurses note.  Effectiveness during initial hour after procedure(Ultra-Short Term Relief): *** %  Local anesthetic used: Long-acting (4-6 hours) Effectiveness: Defined as any analgesic benefit obtained secondary to the administration of local anesthetics. This carries significant diagnostic value as to the etiological location, or anatomical origin, of the pain. Duration of benefit is expected to coincide with the duration of the local anesthetic used.  Effectiveness during initial 4-6 hours after procedure(Short-Term Relief): *** %  Long-term benefit: Defined as any relief past the pharmacologic duration of the local anesthetics.  Effectiveness past the initial 6 hours after procedure(Long-Term Relief): *** %  Current benefits: Defined as  benefit that persist at this time.   Analgesia:   ***  Function:  ***  ROM:  ***   Pharmacotherapy Assessment  Analgesic: *** MME/day: *** mg/day.   Monitoring: Pharmacotherapy: No side-effects or adverse reactions reported. [place] PMP: PDMP not reviewed this encounter.       Compliance: No problems identified or detected. Plan: Refer to "POC".  Review of recent tests  DG C-Arm 1-60 Min-No Report Fluoroscopy was utilized by the requesting physician.  No radiographic  interpretation.    Hospital Outpatient Visit on 06/11/2018  Component Date Value Ref Range Status  . MRSA, PCR 06/11/2018 NEGATIVE  NEGATIVE Final  . Staphylococcus aureus 06/11/2018 NEGATIVE  NEGATIVE Final   Comment: (NOTE) The Xpert SA Assay (FDA approved for NASAL specimens in patients 19 years of age and older), is one component of a comprehensive surveillance program. It is not intended to diagnose infection nor to guide or monitor treatment. Performed at [facility], [address] [place], [place] [address]    Assessment  There were no encounter diagnoses.  Plan of Care  Karen Edwards maintain Karen Edwards Potassium, atorvastatin, B-12, methocarbamol, HYDROcodone-acetaminophen, diazepam, traZODone, and FLUoxetine.  Pharmacotherapy (Medications Ordered): No orders of the defined types were placed in this encounter.  Orders:  No orders of the defined types were placed in this encounter.  Follow-up plan:   No follow-ups on file.   Karen discussed the assessment and treatment plan with the patient. The patient was provided an opportunity to ask questions and all were answered. The patient agreed with the plan and demonstrated an understanding of the instructions.  Patient advised to call back or seek an in-person evaluation if the symptoms or condition worsens.  Total duration of non-face-to-face encounter: *** minutes.  Note by: Gaspar Cola, MD Date: 02/24/2019; Time: 2:54  PM  Disclaimer:  * Given the special circumstances of the COVID-19 pandemic, the federal government has announced that [employer] ([employer]) will exercise its enforcement discretion and will not impose penalties on physicians using telehealth in the event of noncompliance with regulatory requirements under the [place] and Accountability Act (HIPAA) in connection with the good faith provision of telehealth during the GHWEX-93 national public health emergency. ([place])

## 2019-02-23 ENCOUNTER — Ambulatory Visit: Payer: PPO | Attending: Nurse Practitioner | Admitting: Pain Medicine

## 2019-02-23 ENCOUNTER — Other Ambulatory Visit: Payer: Self-pay

## 2019-02-23 VITALS — Ht 61.0 in | Wt 147.0 lb

## 2019-02-23 DIAGNOSIS — M4692 Unspecified inflammatory spondylopathy, cervical region: Secondary | ICD-10-CM | POA: Insufficient documentation

## 2019-02-23 DIAGNOSIS — M7918 Myalgia, other site: Secondary | ICD-10-CM | POA: Diagnosis not present

## 2019-02-23 DIAGNOSIS — G8929 Other chronic pain: Secondary | ICD-10-CM

## 2019-02-23 DIAGNOSIS — M5441 Lumbago with sciatica, right side: Secondary | ICD-10-CM

## 2019-02-23 DIAGNOSIS — M542 Cervicalgia: Secondary | ICD-10-CM | POA: Diagnosis not present

## 2019-02-23 DIAGNOSIS — M5442 Lumbago with sciatica, left side: Secondary | ICD-10-CM

## 2019-02-23 DIAGNOSIS — G894 Chronic pain syndrome: Secondary | ICD-10-CM

## 2019-02-23 MED ORDER — METHOCARBAMOL 500 MG PO TABS: 1000.0000 mg | ORAL_TABLET | Freq: Every day | ORAL | 1 refills | Status: DC

## 2019-02-23 MED ORDER — PREDNISONE 20 MG PO TABS: ORAL_TABLET | ORAL | 0 refills | Status: AC

## 2019-02-23 MED ORDER — HYDROCODONE-ACETAMINOPHEN 5-325 MG PO TABS: 1.0000 | ORAL_TABLET | Freq: Every day | ORAL | 0 refills | Status: DC

## 2019-02-23 NOTE — Patient Instructions (Signed)
____________________________________________________________________________________________  Medication Rules  Purpose: To inform patients, and their family members, of our rules and regulations.  Applies to: All patients receiving prescriptions (written or electronic).  Pharmacy of record: Pharmacy where electronic prescriptions will be sent. If written prescriptions are taken to a different pharmacy, please inform the nursing staff. The pharmacy listed in the electronic medical record should be the one where you would like electronic prescriptions to be sent.  Electronic prescriptions: In compliance with the [place] Strengthen Opioid Misuse Prevention (STOP) Act of 2017 (Session Law 2017-74/H243), effective October 28, 2018, all controlled substances must be electronically prescribed. Calling prescriptions to the pharmacy will cease to exist.  Prescription refills: Only during scheduled appointments. Applies to all prescriptions.  NOTE: The following applies primarily to controlled substances (Opioid* Pain Medications).   Patient's responsibilities: 1. Pain Pills: Bring all pain pills to every appointment (except for procedure appointments). 2. Pill Bottles: Bring pills in original pharmacy bottle. Always bring the newest bottle. Bring bottle, even if empty. 3. Medication refills: You are responsible for knowing and keeping track of what medications you take and those you need refilled. The day before your appointment: write a list of all prescriptions that need to be refilled. The day of the appointment: give the list to the admitting nurse. Prescriptions will be written only during appointments. No prescriptions will be written on procedure days. If you forget a medication: it will not be "Called in", "Faxed", or "electronically sent". You will need to get another appointment to get these prescribed. No early refills. Do not call asking to have your prescription filled  early. 4. Prescription Accuracy: You are responsible for carefully inspecting your prescriptions before leaving our office. Have the discharge nurse carefully go over each prescription with you, before taking them home. Make sure that your name is accurately spelled, that your address is correct. Check the name and dose of your medication to make sure it is accurate. Check the number of pills, and the written instructions to make sure they are clear and accurate. Make sure that you are given enough medication to last until your next medication refill appointment. 5. Taking Medication: Take medication as prescribed. When it comes to controlled substances, taking less pills or less frequently than prescribed is permitted and encouraged. Never take more pills than instructed. Never take medication more frequently than prescribed.  6. Inform other Doctors: Always inform, all of your healthcare providers, of all the medications you take. 7. Pain Medication from other Providers: You are not allowed to accept any additional pain medication from any other Doctor or Healthcare provider. There are two exceptions to this rule. (see below) In the event that you require additional pain medication, you are responsible for notifying us, as stated below. 8. Medication Agreement: You are responsible for carefully reading and following our Medication Agreement. This must be signed before receiving any prescriptions from our practice. Safely store a copy of your signed Agreement. Violations to the Agreement will result in no further prescriptions. (Additional copies of our Medication Agreement are available upon request.) 9. Laws, Rules, & Regulations: All patients are expected to follow all Federal and State Laws, Statutes, Rules, & Regulations. Ignorance of the Laws does not constitute a valid excuse. The use of any illegal substances is prohibited. 10. Adopted CDC guidelines & recommendations: Target dosing levels will be  at or below 60 MME/day. Use of benzodiazepines** is not recommended.  Exceptions: There are only two exceptions to the rule of not   receiving pain medications from other Healthcare Providers. 1. Exception #1 (Emergencies): In the event of an emergency (i.e.: accident requiring emergency care), you are allowed to receive additional pain medication. However, you are responsible for: As soon as you are able, call our office [phone], at any time of the day or night, and leave a message stating your name, the date and nature of the emergency, and the name and dose of the medication prescribed. In the event that your call is answered by a member of our staff, make sure to document and save the date, time, and the name of the person that took your information.  2. Exception #2 (Planned Surgery): In the event that you are scheduled by another doctor or dentist to have any type of surgery or procedure, you are allowed (for a period no longer than 30 days), to receive additional pain medication, for the acute post-op pain. However, in this case, you are responsible for picking up a copy of our "Post-op Pain Management for Surgeons" handout, and giving it to your surgeon or dentist. This document is available at our office, and does not require an appointment to obtain it. Simply go to our office during business hours (Monday-Thursday from 8:00 AM to 4:00 PM) (Friday 8:00 AM to 12:00 Noon) or if you have a scheduled appointment with us, prior to your surgery, and ask for it by name. In addition, you will need to provide us with your name, name of your surgeon, type of surgery, and date of procedure or surgery.  *Opioid medications include: morphine, codeine, oxycodone, oxymorphone, hydrocodone, hydromorphone, meperidine, tramadol, tapentadol, buprenorphine, fentanyl, methadone. **Benzodiazepine medications include: diazepam (Valium), alprazolam (Xanax), clonazepam (Klonopine), lorazepam (Ativan), clorazepate  (Tranxene), chlordiazepoxide (Librium), estazolam (Prosom), oxazepam (Serax), temazepam (Restoril), triazolam (Halcion) (Last updated: 12/25/2017) ____________________________________________________________________________________________   ____________________________________________________________________________________________  Medication Recommendations and Reminders  Applies to: All patients receiving prescriptions (written and/or electronic).  Medication Rules & Regulations: These rules and regulations exist for your safety and that of others. They are not flexible and neither are we. Dismissing or ignoring them will be considered "non-compliance" with medication therapy, resulting in complete and irreversible termination of such therapy. (See document titled "Medication Rules" for more details.) In all conscience, because of safety reasons, we cannot continue providing a therapy where the patient does not follow instructions.  Pharmacy of record:   Definition: This is the pharmacy where your electronic prescriptions will be sent.   We do not endorse any particular pharmacy.  You are not restricted in your choice of pharmacy.  The pharmacy listed in the electronic medical record should be the one where you want electronic prescriptions to be sent.  If you choose to change pharmacy, simply notify our nursing staff of your choice of new pharmacy.  Recommendations:  Keep all of your pain medications in a safe place, under lock and key, even if you live alone.   After you fill your prescription, take 1 week's worth of pills and put them away in a safe place. You should keep a separate, properly labeled bottle for this purpose. The remainder should be kept in the original bottle. Use this as your primary supply, until it runs out. Once it's gone, then you know that you have 1 week's worth of medicine, and it is time to come in for a prescription refill. If you do this correctly, it  is unlikely that you will ever run out of medicine.  To make sure that the above recommendation works,   it is very important that you make sure your medication refill appointments are scheduled at least 1 week before you run out of medicine. To do this in an effective manner, make sure that you do not leave the office without scheduling your next medication management appointment. Always ask the nursing staff to show you in your prescription , when your medication will be running out. Then arrange for the receptionist to get you a return appointment, at least 7 days before you run out of medicine. Do not wait until you have 1 or 2 pills left, to come in. This is very poor planning and does not take into consideration that we may need to cancel appointments due to bad weather, sickness, or emergencies affecting our staff.  "Partial Fill": If for any reason your pharmacy does not have enough pills/tablets to completely fill or refill your prescription, do not allow for a "partial fill". You will need a separate prescription to fill the remaining amount, which we will not provide. If the reason for the partial fill is your insurance, you will need to talk to the pharmacist about payment alternatives for the remaining tablets, but again, do not accept a partial fill.  Prescription refills and/or changes in medication(s):   Prescription refills, and/or changes in dose or medication, will be conducted only during scheduled medication management appointments. (Applies to both, written and electronic prescriptions.)  No refills on procedure days. No medication will be changed or started on procedure days. No changes, adjustments, and/or refills will be conducted on a procedure day. Doing so will interfere with the diagnostic portion of the procedure.  No phone refills. No medications will be "called into the pharmacy".  No Fax refills.  No weekend refills.  No Holliday refills.  No after hours  refills.  Remember:  Business hours are:  Monday to Thursday 8:00 AM to 4:00 PM Provider's Schedule: Crystal King, NP - Appointments are:  Medication management: Monday to Thursday 8:00 AM to 4:00 PM Cristie Mckinney, MD - Appointments are:  Medication management: Monday and Wednesday 8:00 AM to 4:00 PM Procedure day: Tuesday and Thursday 7:30 AM to 4:00 PM Bilal Lateef, MD - Appointments are:  Medication management: Tuesday and Thursday 8:00 AM to 4:00 PM Procedure day: Monday and Wednesday 7:30 AM to 4:00 PM (Last update: 12/25/2017) ____________________________________________________________________________________________   

## 2019-02-23 NOTE — Progress Notes (Signed)
Pain Management Virtual Encounter Note - Virtual Visit via Telephone Telehealth (real-time audio visits between healthcare provider and patient).  Patient's Phone No. & Preferred Pharmacy:  [phone] (home); There is no such number on file (mobile).; (Preferred) [phone] [email or web address]  [place] [address] [place] [address] Phone: [phone] Fax: [phone]   Pre-screening note:  Our staff contacted Karen Edwards and offered her an "in person", "face-to-face" appointment versus a telephone encounter. She indicated preferring the telephone encounter, at this time.  Reason for Virtual Visit: COVID-19*  Social distancing based on CDC and AMA recommendations.   I contacted Karen Edwards on 02/23/2019 at 10:39 AM via telephone and clearly identified myself as Karen Cola, MD. I verified that I was speaking with the correct person using two identifiers (Name and date of birth: July 21, 1966).  Advanced Informed Consent I sought verbal advanced consent from Karen Edwards for virtual visit interactions. I informed Karen Edwards of possible security and privacy concerns, risks, and limitations associated with providing "not-in-person" medical evaluation and management services. I also informed Karen Edwards of the availability of "in-person" appointments. Finally, I informed her that there would be a charge for the virtual visit and that she could be  personally, fully or partially, financially responsible for it. Karen Edwards expressed understanding and agreed to proceed.   Historic Elements   Karen Edwards is a 53 y.o. year old, female patient evaluated today after her last encounter by our practice on 02/10/2019. Karen Edwards  has a past medical history of Anxiety, Chronic back pain, Degenerative joint disease, Depression, Dyspnea, Hypotension, IBS (irritable bowel syndrome) (05/2015), and MVP  (mitral valve prolapse). She also  has a past surgical history that includes Cesarean section; Ankle surgery (Right, 03/02/2014); Hemorrhoid surgery (N/A, 06/16/2015); Cervical spine surgery (02/21/2016); Cesarean section with bilateral tubal ligation (1990); Colonoscopy (04/19/2015); Hysteroscopy w/D&C (12/24/2013); Colonoscopy with propofol (N/A, 05/18/2018); Esophagogastroduodenoscopy (egd) with propofol (N/A, 05/18/2018); and Spinal cord stimulator insertion (N/A, 06/18/2018). Karen Edwards has a current medication list which includes the following prescription(s): atorvastatin, b-12, diazepam, fluoxetine, potassium, trazodone, hydrocodone-acetaminophen, methocarbamol, and prednisone. She  reports that she has been smoking cigarettes. She has a 18.50 pack-year smoking history. She has never used smokeless tobacco. She reports current alcohol use of about 1.0 standard drinks of alcohol per week. She reports that she does not use drugs. Karen Edwards is allergic to no known allergies.   HPI  I last communicated with her on 02/10/2019. Today, she is being contacted for both, medication management and a post-procedure assessment.  The patient indicates that the reason why she did not come back for her regular follow-up it is because her husband was very sick and in fact he passed away.  She is now taking care of an older family member that requires a lot of physical assistance and this is worsening her neck pain, shoulder pain, and low back pain.  She indicates that her neck and shoulder pain is worse than the low back pain.  With regards to the radiofrequency on the right side of the neck, she indicates that this worked well in bringing down the pain but unfortunately is has been wearing off and by now it is only about 25% better than when it started.  She describes the pain in the neck as being bilateral with the right side currently being worse than the left.  She has been  experiencing shooting pains into the upper  extremities but they travel through the lateral aspect of the upper arm down to the elbow but it does not go any further down the arm.  In the case of the lower back pain this is bilateral with the right side being worse than the left and although it is referring pain towards the legs, this referred pain goes just below the area of the buttocks without reaching the knee.  She indicates that she ran out of the Robaxin and the hydrocodone long ago.  Today I will be refilling her methocarbamol and the hydrocodone and I will also send a prescription for a steroid taper to her pharmacy.  We will try to communicate with her again in approximately 1 months to see how she is doing and if the pain continues, then we will plan to bring her in for interventional treatments.  For now, we are limited by the COVID-19 restrictions.  Post-Procedure Evaluation  Procedure: Therapeutic right-sided cervical facet RFA #1 under fluoroscopic guidance and IV sedation on 09/17/2018. Pre-procedure pain level:  4/10 Post-procedure: 0/10 (100% relief)  Initial Analgesic Effects (1st post-procedure hour): 100 % Sedation: Mild to moderate IV sedation provided for procedural analgesia and anxiety. When none is used, analgesia during this period is strictly due to the local anesthetic. When sedation is administered, analgesia may be a combination of the IV analgesic/anxiolytic, plus the effect of the local anesthetics used.  Persistent Analgesic Response (subsequent 4-6 hours post-procedure): 100 % Local anesthetic used: Long-acting (4-6 hours) Analgesic effects during this period is associated to the localized infiltration of local anesthetics and therefore caries significant diagnostic value as to the etiological location, or anatomical origin, of the pain. Expected duration of relief is directly dependent on the pharmacodynamics of the local anesthetic used.  Analgesic Response past initial 6 hours post-procedure: 85 % x 3-4  months Long-term benefit: Defined as any relief past the pharmacologic duration of the local anesthetics.   Current benefits: Defined as benefit that persist at this time.   Analgesia: <50% better than before the treatment. Function: Somewhat improved ROM: Somewhat improved  Pharmacotherapy Assessment  Analgesic: Hydrocodone/APAP 5/325 1 tablet p.o. twice daily MME/day:10mg /day   Monitoring: Pharmacotherapy: No side-effects or adverse reactions reported. [place] PMP: PDMP reviewed during this encounter.          Compliance: No problems identified or detected. Plan: Refer to "POC".  Review of recent tests  DG C-Arm 1-60 Min-No Report Fluoroscopy was utilized by the requesting physician.  No radiographic  interpretation.    Hospital Outpatient Visit on 06/11/2018  Component Date Value Ref Range Status  . MRSA, PCR 06/11/2018 NEGATIVE  NEGATIVE Final  . Staphylococcus aureus 06/11/2018 NEGATIVE  NEGATIVE Final   Comment: (NOTE) The Xpert SA Assay (FDA approved for NASAL specimens in patients 78 years of age and older), is one component of a comprehensive surveillance program. It is not intended to diagnose infection nor to guide or monitor treatment. Performed at [facility], [address], [place], [place] [address]    Assessment  The primary encounter diagnosis was Chronic pain syndrome. 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  Plan of Care  I have changed Zabrina L. Kostelecky's HYDROcodone-acetaminophen. I am also having her start on predniSONE. Additionally, I am having her maintain  her Potassium, atorvastatin, B-12, diazepam, traZODone, FLUoxetine, and methocarbamol.  Pharmacotherapy (Medications Ordered): Meds ordered this encounter  Medications  .  HYDROcodone-acetaminophen (NORCO/VICODIN) 5-325 MG tablet    Sig: Take 1-2 tablets by mouth at bedtime for 30 days. Must last 30 days.    Dispense:  60 tablet    Refill:  0     STOP ACT - Not applicable to Chronic Pain Syndrome (G89.4) diagnosis. Fill one day early if pharmacy is closed on scheduled refill date. Do not fill until: 02/23/19. To last until: 03/25/19.  Marland Kitchen methocarbamol (ROBAXIN) 500 MG tablet    Sig: Take 2 tablets (1,000 mg total) by mouth at bedtime.    Dispense:  180 tablet    Refill:  1    Do not place medication on "Automatic Refill". Fill one day early if pharmacy is closed on scheduled refill date.  . predniSONE (DELTASONE) 20 MG tablet    Sig: Take 3 tablets (60 mg total) by mouth daily with breakfast for 3 days, THEN 2 tablets (40 mg total) daily with breakfast for 3 days, THEN 1 tablet (20 mg total) daily with breakfast for 3 days.    Dispense:  18 tablet    Refill:  0   Orders:  No orders of the defined types were placed in this encounter.  Follow-up plan:   Return in about 1 month (around 03/25/2019) for (Virtual Visit), Med-Mgmt, (w/ MD_FN).   I discussed the assessment and treatment plan with the patient. The patient was provided an opportunity to ask questions and all were answered. The patient agreed with the plan and demonstrated an understanding of the instructions.  Patient advised to call back or seek an in-person evaluation if the symptoms or condition worsens.  Total duration of non-face-to-face encounter: 12 minutes.  Note by: Karen Cola, MD Date: 02/23/2019; Time: 10:55 AM  Disclaimer:  * Given the special circumstances of the COVID-19 pandemic, the federal government has announced that the Office for Civil Rights (OCR) will exercise its enforcement discretion and will not impose penalties on physicians using telehealth in the event of noncompliance with regulatory requirements under the [place] and Accountability Act  (HIPAA) in connection with the good faith provision of telehealth during the VANVB-16 national public health emergency. ([place])

## 2019-02-24 ENCOUNTER — Encounter: Payer: PPO | Admitting: Pain Medicine

## 2019-03-23 ENCOUNTER — Telehealth: Payer: Self-pay | Admitting: *Deleted

## 2019-03-23 NOTE — Progress Notes (Signed)
Pain Management Virtual Encounter Note - Virtual Visit via [place] (real-time audio visits between healthcare provider and patient).  Patient's Phone No. & Preferred Pharmacy:  [phone] (home); There is no such number on file (mobile).; (Preferred) [phone] [email or web address]  [place] [address] [place] [address] Phone: [phone] Fax: [phone]   Pre-screening note:  Our staff contacted Karen Edwards and offered her an "in person", "face-to-face" appointment versus a telephone encounter. She indicated preferring the telephone encounter, at this time.  Reason for Virtual Visit: COVID-19*  Social distancing based on CDC and AMA recommendations.   I contacted Karen Edwards on 03/24/2019 at 2:25 PM via video conference.      I clearly identified myself as Gaspar Cola, MD. I verified that I was speaking with the correct person using two identifiers (Name and date of birth: 08-22-1966).  Advanced Informed Consent I sought verbal advanced consent from Karen Edwards for virtual visit interactions. I informed Karen Edwards of possible security and privacy concerns, risks, and limitations associated with providing "not-in-person" medical evaluation and management services. I also informed Karen Edwards of the availability of "in-person" appointments. Finally, I informed her that there would be a charge for the virtual visit and that she could be  personally, fully or partially, financially responsible for it. Karen Edwards expressed understanding and agreed to proceed.   Historic Elements   Karen Edwards is a 53 y.o. year old, female patient evaluated today after her last encounter by our practice on 03/23/2019. Karen Edwards  has a past medical history of Anxiety, Chronic back pain, Degenerative joint disease, Depression, Dyspnea, Hypotension, IBS (irritable bowel syndrome)  (05/2015), and MVP (mitral valve prolapse). She also  has a past surgical history that includes Cesarean section; Ankle surgery (Right, 03/02/2014); Hemorrhoid surgery (N/A, 06/16/2015); Cervical spine surgery (02/21/2016); Cesarean section with bilateral tubal ligation (1990); Colonoscopy (04/19/2015); Hysteroscopy w/D&C (12/24/2013); Colonoscopy with propofol (N/A, 05/18/2018); Esophagogastroduodenoscopy (egd) with propofol (N/A, 05/18/2018); and Spinal cord stimulator insertion (N/A, 06/18/2018). Karen Edwards has a current medication list which includes the following prescription(s): cascara sagrada, b-12, flax seed oil, fluoxetine, hydrocodone-acetaminophen, hydrocodone-acetaminophen, hydrocodone-acetaminophen, methocarbamol, and trazodone. She  reports that she has been smoking cigarettes. She has a 18.50 pack-year smoking history. She has never used smokeless tobacco. She reports current alcohol use of about 1.0 standard drinks of alcohol per week. She reports that she does not use drugs. Karen Edwards is allergic to no known allergies.   HPI  I last communicated with her on 02/23/2019. Today, she is being contacted for medication management.  The patient seems to be doing well on her current medication regimen.  However, she has been experiencing more pain in the left shoulder and the neck area.  We have treated the right side and this provided her with excellent relief of the pain and she has not had any problems with it.  She would like for [place] to treat the opposite side.  In addition to this, she has been having some pain in the area of the spinal cord stimulator battery, towards the top.  We used the video communication to take a look at the area and there is no evidence of redness, swelling, only the tenderness.  Today I will refill her medications and we plan to bring her in front of the left shoulder injection.  Note: Today we have expressed our condolences to  the patient.  Sadly, her husband was recently  involved in a farming accident where he fell out of a tractor and was run over, killing him.  Pharmacotherapy Assessment  Analgesic: Hydrocodone/APAP 5/325 1 tablet p.o. twice daily MME/day:10mg /day  Monitoring: Pharmacotherapy: No side-effects or adverse reactions reported. [place] PMP: PDMP reviewed during this encounter.       Compliance: No problems identified. Effectiveness: Clinically acceptable. Plan: Refer to "POC".  Pertinent Labs  Renal Function Lab Results  Component Value Date   BUN 7 09/08/2017   CREATININE 0.84 09/08/2017   BCR 8 (L) 09/08/2017   GFRAA 93 09/08/2017   GFRNONAA 81 09/08/2017   Hepatic Function Lab Results  Component Value Date   AST 18 09/08/2017   ALT 12 (L) 01/30/2017   ALBUMIN 4.7 09/08/2017   UDS Summary  Date Value Ref Range Status  04/06/2018 FINAL  Final    Comment:    ==================================================================== TOXASSURE SELECT 13 (MW) ==================================================================== Test                             Result       Flag       Units Drug Present and Declared for Prescription Verification   Norhydrocodone                 228          EXPECTED   ng/mg creat    Norhydrocodone is an expected metabolite of hydrocodone. Drug Absent but Declared for Prescription Verification   Hydrocodone                    Not Detected UNEXPECTED ng/mg creat    Hydrocodone is almost always present in patients taking this drug    consistently. Absence of hydrocodone could be due to lapse of    time since the last dose or unusual pharmacokinetics (rapid    metabolism). ==================================================================== Test                      Result    Flag   Units      Ref Range   Creatinine              60               mg/dL      >=20 ==================================================================== Declared Medications:  The flagging and interpretation on this report are  based on the  following declared medications.  Unexpected results may arise from  inaccuracies in the declared medications.  **Note: The testing scope of this panel includes these medications:  Hydrocodone (Norco)  **Note: The testing scope of this panel does not include following  reported medications:  Acetaminophen (Norco)  Atorvastatin (Lipitor)  Cyanocobalamin  Fluoxetine (Prozac)  Methocarbamol (Robaxin)  Potassium  Supplement  Trazodone (Desyrel) ==================================================================== For clinical consultation, please call [phone]. ====================================================================    Note: Above Lab results reviewed.  Recent imaging  DG C-Arm 1-60 Min-No Report Fluoroscopy was utilized by the requesting physician.  No radiographic  interpretation.   Assessment  The primary encounter diagnosis was Chronic neck pain (Fourth Area of Pain) (Bilateral) (R>L). Diagnoses of Chronic shoulder pain (Bilateral) (L>R), Cervical facet syndrome (Bilateral) (R>L), Spondylosis without myelopathy or radiculopathy, cervical region, Chronic pain syndrome, and Preop testing were also pertinent to this visit.  Plan of Care  I have discontinued Prima L. Edwards's Potassium, atorvastatin, and diazepam. I am also having  her start on HYDROcodone-acetaminophen and HYDROcodone-acetaminophen. Additionally, I am having her maintain her B-12, traZODone, FLUoxetine, methocarbamol, Cascara Sagrada, Flax Seed Oil, and HYDROcodone-acetaminophen.  Pharmacotherapy (Medications Ordered): Meds ordered this encounter  Medications  . HYDROcodone-acetaminophen (NORCO/VICODIN) 5-325 MG tablet    Sig: Take 1-2 tablets by mouth at bedtime for 30 days. Must last 30 days.    Dispense:  60 tablet    Refill:  0    Chronic Pain. (STOP Act - Not applicable). Fill one day early if closed on scheduled refill date. Do not fill until: 03/24/19. To last until:  04/23/19.  Marland Kitchen HYDROcodone-acetaminophen (NORCO/VICODIN) 5-325 MG tablet    Sig: Take 1-2 tablets by mouth at bedtime for 30 days. Must last 30 days.    Dispense:  60 tablet    Refill:  0    Chronic Pain. (STOP Act - Not applicable). Fill one day early if closed on scheduled refill date. Do not fill until: 04/23/19. To last until: 05/23/19.  Marland Kitchen HYDROcodone-acetaminophen (NORCO/VICODIN) 5-325 MG tablet    Sig: Take 1-2 tablets by mouth at bedtime for 30 days. Must last 30 days.    Dispense:  60 tablet    Refill:  0    Chronic Pain. (STOP Act - Not applicable). Fill one day early if closed on scheduled refill date. Do not fill until: 05/23/19. To last until: 06/22/19.   Orders:  Orders Placed This Encounter  Procedures  . CERVICAL FACET (MEDIAL BRANCH NERVE BLOCK)     Standing Status:   Future    Standing Expiration Date:   04/24/2019    Scheduling Instructions:     Side: Left-sided     Level: C3-4, C4-5, C5-6 Facet joints (C3, C4, C5, C6, & C7 Medial Branch Nerves)     Sedation: With Sedation.     Timeframe: As soon as schedule allows    Order Specific Question:   Where will this procedure be performed?    Answer:   [facility]  . Novel Coronavirus, NAA ([facility])    Send patient to pre-admission testing for collection. Estimated turn-around time: 72 hrs.   Follow-up plan:   Return for (3 mo) Med-Mgmt, in addition, Procedure, (w/ sedation): (L) C-FCT BLK.    I discussed the assessment and treatment plan with the patient. The patient was provided an opportunity to ask questions and all were answered. The patient agreed with the plan and demonstrated an understanding of the instructions.  Patient advised to call back or seek an in-person evaluation if the symptoms or condition worsens.  Total duration of non-face-to-face encounter: 20 minutes. (Video)  Note by: Gaspar Cola, MD Date: 03/24/2019; Time: 3:05 PM  Note: This dictation was prepared with Dragon dictation. Any  transcriptional errors that may result from this process are unintentional.  Disclaimer:  * Given the special circumstances of the COVID-19 pandemic, the federal government has announced that the Office for Civil Rights (OCR) will exercise its enforcement discretion and will not impose penalties on physicians using telehealth in the event of noncompliance with regulatory requirements under the [place] and [place] (HIPAA) in connection with the good faith provision of telehealth during the SUPJS-31 national public health emergency. ([place])

## 2019-03-23 NOTE — Telephone Encounter (Signed)
Voicemail left with patient to please call prior to appt time on tomorrow so that we can review medications.

## 2019-03-24 ENCOUNTER — Ambulatory Visit: Payer: PPO | Attending: Pain Medicine | Admitting: Pain Medicine

## 2019-03-24 ENCOUNTER — Other Ambulatory Visit: Payer: Self-pay

## 2019-03-24 ENCOUNTER — Encounter: Payer: Self-pay | Admitting: Pain Medicine

## 2019-03-24 DIAGNOSIS — G894 Chronic pain syndrome: Secondary | ICD-10-CM

## 2019-03-24 DIAGNOSIS — G8929 Other chronic pain: Secondary | ICD-10-CM | POA: Diagnosis not present

## 2019-03-24 DIAGNOSIS — M25512 Pain in left shoulder: Secondary | ICD-10-CM

## 2019-03-24 DIAGNOSIS — Z01818 Encounter for other preprocedural examination: Secondary | ICD-10-CM | POA: Diagnosis not present

## 2019-03-24 DIAGNOSIS — M542 Cervicalgia: Secondary | ICD-10-CM

## 2019-03-24 DIAGNOSIS — M25511 Pain in right shoulder: Secondary | ICD-10-CM

## 2019-03-24 DIAGNOSIS — M47812 Spondylosis without myelopathy or radiculopathy, cervical region: Secondary | ICD-10-CM

## 2019-03-24 MED ORDER — HYDROCODONE-ACETAMINOPHEN 5-325 MG PO TABS: 1.0000 | ORAL_TABLET | Freq: Every day | ORAL | 0 refills | Status: DC

## 2019-03-24 NOTE — Patient Instructions (Signed)

## 2019-03-26 ENCOUNTER — Other Ambulatory Visit
Admission: RE | Admit: 2019-03-26 | Discharge: 2019-03-26 | Disposition: A | Discharge status: Home / Self Care | Payer: PPO | Source: Ambulatory Visit | Attending: Pain Medicine | Admitting: Pain Medicine

## 2019-03-26 ENCOUNTER — Other Ambulatory Visit: Payer: Self-pay

## 2019-03-26 DIAGNOSIS — Z1159 Encounter for screening for other viral diseases: Secondary | ICD-10-CM | POA: Insufficient documentation

## 2019-03-27 LAB — NOVEL CORONAVIRUS, NAA (HOSP ORDER, SEND-OUT TO REF LAB; TAT 18-24 HRS): SARS-CoV-2, NAA: NOT DETECTED

## 2019-04-01 ENCOUNTER — Encounter: Payer: Self-pay | Admitting: Pain Medicine

## 2019-04-01 ENCOUNTER — Ambulatory Visit
Admission: RE | Admit: 2019-04-01 | Discharge: 2019-04-01 | Disposition: A | Discharge status: Home / Self Care | Payer: PPO | Source: Ambulatory Visit | Attending: Pain Medicine | Admitting: Pain Medicine

## 2019-04-01 ENCOUNTER — Ambulatory Visit (HOSPITAL_BASED_OUTPATIENT_CLINIC_OR_DEPARTMENT_OTHER): Payer: PPO | Admitting: Pain Medicine

## 2019-04-01 ENCOUNTER — Other Ambulatory Visit: Payer: Self-pay

## 2019-04-01 VITALS — BP 115/66 | HR 62 | Temp 98.6°F | Resp 18 | Ht 61.0 in | Wt 140.0 lb

## 2019-04-01 DIAGNOSIS — M542 Cervicalgia: Secondary | ICD-10-CM | POA: Diagnosis not present

## 2019-04-01 DIAGNOSIS — Z9889 Other specified postprocedural states: Secondary | ICD-10-CM | POA: Diagnosis not present

## 2019-04-01 DIAGNOSIS — M4692 Unspecified inflammatory spondylopathy, cervical region: Secondary | ICD-10-CM | POA: Diagnosis not present

## 2019-04-01 DIAGNOSIS — G8929 Other chronic pain: Secondary | ICD-10-CM | POA: Diagnosis not present

## 2019-04-01 DIAGNOSIS — M5030 Other cervical disc degeneration, unspecified cervical region: Secondary | ICD-10-CM | POA: Diagnosis not present

## 2019-04-01 DIAGNOSIS — M47812 Spondylosis without myelopathy or radiculopathy, cervical region: Secondary | ICD-10-CM

## 2019-04-01 DIAGNOSIS — M5481 Occipital neuralgia: Secondary | ICD-10-CM | POA: Insufficient documentation

## 2019-04-01 DIAGNOSIS — G8928 Other chronic postprocedural pain: Secondary | ICD-10-CM | POA: Insufficient documentation

## 2019-04-01 MED ORDER — FENTANYL CITRATE (PF) 100 MCG/2ML IJ SOLN
25.0000 ug | INTRAMUSCULAR | Status: DC | PRN
  Administered 2019-04-01: 50 ug via INTRAVENOUS
  Filled 2019-04-01: qty 2

## 2019-04-01 MED ORDER — LACTATED RINGERS IV SOLN
1000.0000 mL | Freq: Once | INTRAVENOUS | Status: AC
  Administered 2019-04-01: 1000 mL via INTRAVENOUS

## 2019-04-01 MED ORDER — MIDAZOLAM HCL 5 MG/5ML IJ SOLN
1.0000 mg | INTRAMUSCULAR | Status: DC | PRN
  Administered 2019-04-01: 2 mg via INTRAVENOUS
  Filled 2019-04-01: qty 5

## 2019-04-01 MED ORDER — DEXAMETHASONE SODIUM PHOSPHATE 10 MG/ML IJ SOLN
10.0000 mg | Freq: Once | INTRAMUSCULAR | Status: AC
  Administered 2019-04-01: 10 mg
  Filled 2019-04-01: qty 1

## 2019-04-01 MED ORDER — ROPIVACAINE HCL 2 MG/ML IJ SOLN
9.0000 mL | Freq: Once | INTRAMUSCULAR | Status: AC
  Administered 2019-04-01: 9 mL via PERINEURAL
  Filled 2019-04-01: qty 10

## 2019-04-01 MED ORDER — LIDOCAINE HCL 2 % IJ SOLN
20.0000 mL | Freq: Once | INTRAMUSCULAR | Status: AC
  Administered 2019-04-01: 400 mg
  Filled 2019-04-01: qty 20

## 2019-04-01 NOTE — Progress Notes (Signed)
Safety precautions to be maintained throughout the outpatient stay will include: orient to surroundings, keep bed in low position, maintain call bell within reach at all times, provide assistance with transfer out of bed and ambulation.  

## 2019-04-01 NOTE — Patient Instructions (Addendum)

## 2019-04-01 NOTE — Progress Notes (Addendum)
Patient's Name: Karen Edwards  MRN: [id number]  Referring Provider: Pleas Koch, NP  DOB: 1966/05/14  PCP: Pleas Koch, NP  DOS: 04/01/2019  Note by: Gaspar Cola, MD  Service setting: Ambulatory outpatient  Specialty: Interventional Pain Management  Patient type: Established  Location: [facility] (AMB) Pain Management Facility  Visit type: Interventional Procedure   Primary Reason for Visit: Interventional Pain Management Treatment. CC: Neck Pain (left)  Procedure:          Anesthesia, Analgesia, Anxiolysis:  Type: Cervical Facet Medial Branch Block(s)           Primary Purpose: Diagnostic Region: Posterolateral cervical spine Level: C3, C4, C5, C6, & C7 Medial Branch Level(s). Injecting these levels blocks the C3-4, C4-5, C5-6, and C6-7 cervical facet joints. Laterality: Left  Type: Moderate (Conscious) Sedation combined with Local Anesthesia Indication(s): Analgesia and Anxiety Route: Intravenous (IV) IV Access: Secured Sedation: Meaningful verbal contact was maintained at all times during the procedure  Local Anesthetic: Lidocaine 1-2%  Position: Prone with head of the table raised to facilitate breathing.   Indications: 1. 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 DDD (degenerative disc disease), cervical   3. Cervicalgia   4. 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 Spondylosis without myelopathy or radiculopathy, cervical region   6. Chronic neck pain with history of cervical spinal surgery   7. Chronic neck pain ([place] Area of Pain) (Bilateral) (R>L)   8. Chronic occipital neuralgia (Fifth Area of Pain) (Bilateral) (R>L)    Pain Score: Pre-procedure: 4 /10 Post-procedure: 2 /10  Pre-op Assessment:  Karen Edwards is a 53 y.o. (year old), female patient, seen today for interventional treatment. She  has a past surgical history that includes Cesarean section; Ankle surgery (Right, 03/02/2014); Hemorrhoid surgery (N/A, 06/16/2015); Cervical  spine surgery (02/21/2016); Cesarean section with bilateral tubal ligation (1990); Colonoscopy (04/19/2015); Hysteroscopy w/D&C (12/24/2013); Colonoscopy with propofol (N/A, 05/18/2018); Esophagogastroduodenoscopy (egd) with propofol (N/A, 05/18/2018); and Spinal cord stimulator insertion (N/A, 06/18/2018). Karen Edwards has a current medication list which includes the following prescription(s): casc[place] sagrada, b-12, flax seed oil, hydrocodone-acetaminophen, hydrocodone-acetaminophen, hydrocodone-acetaminophen, methocarbamol, trazodone, and fluoxetine, and the following Facility-Administered Medications: fentanyl, lactated ringers, and midazolam. Her primarily concern today is the Neck Pain (left)  Initial Vital Signs:  Pulse/HCG Rate: 68ECG Heart Rate: (!) 57 Temp: 98.7 F (37.1 C) Resp: 18 BP: 122/75 SpO2: 98 %  BMI: Estimated body mass index is 26.45 kg/m as calculated from the following:   Height as of this encounter: 5\' 1"  (1.549 m).   Weight as of this encounter: 140 lb (63.5 kg).  Risk Assessment: Allergies: Reviewed. She is allergic to no known allergies.  Allergy Precautions: None required Coagulopathies: Reviewed. None identified.  Blood-thinner therapy: None at this time Active Infection(s): Reviewed. None identified. Ms. Mollenhauer is afebrile  Site Confirmation: Karen Edwards was asked to confirm the procedure and laterality before marking the site Procedure checklist: Completed Consent: Before the procedure and under the influence of no sedative(s), amnesic(s), or anxiolytics, the patient was informed of the treatment options, risks and possible complications. To fulfill our ethical and legal obligations, as recommended by the American Medical Association's Code of Ethics, I have informed the patient of my clinical impression; the nature and purpose of the treatment or procedure; the risks, benefits, and possible complications of the intervention; the alternatives, including doing  nothing; the risk(s) and benefit(s) of the alternative treatment(s) or procedure(s); and the risk(s) and benefit(s) of doing nothing.  The patient was provided information about the general risks and possible complications associated with the procedure. These may include, but are not limited to: failure to achieve desired goals, infection, bleeding, organ or nerve damage, allergic reactions, paralysis, and death. In addition, the patient was informed of those risks and complications associated to Spine-related procedures, such as failure to decrease pain; infection (i.e.: Meningitis, epidural or intraspinal abscess); bleeding (i.e.: epidural hematoma, subarachnoid hemorrhage, or any other type of intraspinal or peri-dural bleeding); organ or nerve damage (i.e.: Any type of peripheral nerve, nerve root, or spinal cord injury) with subsequent damage to sensory, motor, and/or autonomic systems, resulting in permanent pain, numbness, and/or weakness of one or several areas of the body; allergic reactions; (i.e.: anaphylactic reaction); and/or death. Furthermore, the patient was informed of those risks and complications associated with the medications. These include, but are not limited to: allergic reactions (i.e.: anaphylactic or anaphylactoid reaction(s)); adrenal axis suppression; blood sugar elevation that in diabetics may result in ketoacidosis or comma; water retention that in patients with history of congestive heart failure may result in shortness of breath, pulmonary edema, and decompensation with resultant heart failure; weight gain; swelling or edema; medication-induced neural toxicity; particulate matter embolism and blood vessel occlusion with resultant organ, and/or nervous system infarction; and/or aseptic necrosis of one or more joints. Finally, the patient was informed that Medicine is not an exact science; therefore, there is also the possibility of unforeseen or unpredictable risks and/or possible  complications that may result in a catastrophic outcome. The patient indicated having understood very clearly. We have given the patient no guarantees and we have made no promises. Enough time was given to the patient to ask questions, all of which were answered to the patient's satisfaction. Ms. Lish has indicated that she wanted to continue with the procedure. Attestation: I, the ordering provider, attest that I have discussed with the patient the benefits, risks, side-effects, alternatives, likelihood of achieving goals, and potential problems during recovery for the procedure that I have provided informed consent. Date   Time: 04/01/2019  1:27 PM  Pre-Procedure Preparation:  Monitoring: As per clinic protocol. Respiration, ETCO2, SpO2, BP, heart rate and rhythm monitor placed and checked for adequate function Safety Precautions: Patient was assessed for positional comfort and pressure points before starting the procedure. Time-out: I initiated and conducted the "Time-out" before starting the procedure, as per protocol. The patient was asked to participate by confirming the accuracy of the "Time Out" information. Verification of the correct person, site, and procedure were performed and confirmed by me, the nursing staff, and the patient. "Time-out" conducted as per Joint Commission's Universal Protocol (UP.01.01.01). Time: 1453  Description of Procedure:          Laterality: Left Level: C3, C4, C5, C6, & C7 Medial Branch Level(s). Area Prepped: Posterior Cervico-thoracic Region Prepping solution: 35M DuraPrep (Iodine Povacrylex [0.7% available iodine] and Isopropyl Alcohol, 74% w/w) Safety Precautions: Aspiration looking for blood return was conducted prior to all injections. At no point did we inject any substances, as a needle was being advanced. Before injecting, the patient was told to immediately notify me if she was experiencing any new onset of "ringing in the ears, or metallic taste in  the mouth". No attempts were made at seeking any paresthesias. Safe injection practices and needle disposal techniques used. Medications properly checked for expiration dates. SDV (single dose vial) medications used. After the completion of the procedure, all disposable equipment used was discarded in the proper designated  medical waste containers. Local Anesthesia: Protocol guidelines were followed. The patient was positioned over the fluoroscopy table. The area was prepped in the usual manner. The time-out was completed. The target area was identified using fluoroscopy. A 12-in long, straight, sterile hemostat was used with fluoroscopic guidance to locate the targets for each level blocked. Once located, the skin was marked with an approved surgical skin marker. Once all sites were marked, the skin (epidermis, dermis, and hypodermis), as well as deeper tissues (fat, connective tissue and muscle) were infiltrated with a small amount of a short-acting local anesthetic, loaded on a 10cc syringe with a 25G, 1.5-in  Needle. An appropriate amount of time was allowed for local anesthetics to take effect before proceeding to the next step. Local Anesthetic: Lidocaine 2.0% The unused portion of the local anesthetic was discarded in the proper designated containers. Technical explanation of process:  C3 Medial Branch Nerve Block (MBB): The target area for the C3 dorsal medial articular branch is the lateral concave waist of the articular pillar of C3. Under fluoroscopic guidance, a Quincke needle was inserted until contact was made with os over the postero-lateral aspect of the articular pillar of C3 (target area). After negative aspiration for blood, 0.5 mL of the nerve block solution was injected without difficulty or complication. The needle was removed intact. C4 Medial Branch Nerve Block (MBB): The target area for the C4 dorsal medial articular branch is the lateral concave waist of the articular pillar of C4.  Under fluoroscopic guidance, a Quincke needle was inserted until contact was made with os over the postero-lateral aspect of the articular pillar of C4 (target area). After negative aspiration for blood, 0.5 mL of the nerve block solution was injected without difficulty or complication. The needle was removed intact. C5 Medial Branch Nerve Block (MBB): The target area for the C5 dorsal medial articular branch is the lateral concave waist of the articular pillar of C5. Under fluoroscopic guidance, a Quincke needle was inserted until contact was made with os over the postero-lateral aspect of the articular pillar of C5 (target area). After negative aspiration for blood, 0.5 mL of the nerve block solution was injected without difficulty or complication. The needle was removed intact. C6 Medial Branch Nerve Block (MBB): The target area for the C6 dorsal medial articular branch is the lateral concave waist of the articular pillar of C6. Under fluoroscopic guidance, a Quincke needle was inserted until contact was made with os over the postero-lateral aspect of the articular pillar of C6 (target area). After negative aspiration for blood, 0.5 mL of the nerve block solution was injected without difficulty or complication. The needle was removed intact. C7 Medial Branch Nerve Block (MBB): The target for the C7 dorsal medial articular branch lies on the superior-medial tip of the C7 transverse process. Under fluoroscopic guidance, a Quincke needle was inserted until contact was made with os over the postero-lateral aspect of the articular pillar of C7 (target area). After negative aspiration for blood, 0.5 mL of the nerve block solution was injected without difficulty or complication. The needle was removed intact. Procedural Needles: 22-gauge, 3.5-inch, Quincke needles used for all levels. Nerve block solution: 0.2% PF-Ropivacaine + Triamcinolone (40 mg/mL) diluted to a final concentration of 4 mg of Triamcinolone/mL of  Ropivacaine The unused portion of the solution was discarded in the proper designated containers.  Once the entire procedure was completed, the treated area was cleaned, making sure to leave some of the prepping solution back to take  advantage of its long term bactericidal properties.  Anatomy Reference Guide:       Vitals:   04/01/19 1500 04/01/19 1505 04/01/19 1515 04/01/19 1525  BP: 123/76 (!) 134/54 124/78 115/66  Pulse: 62     Resp: 16 16 18 18   Temp:  98.6 F (37 C)  98.6 F (37 C)  TempSrc:      SpO2: 93% 95% 95% 98%  Weight:      Height:        Start Time: 1453 hrs. End Time: 1459 hrs.  Imaging Guidance (Spinal):          Type of Imaging Technique: Fluoroscopy Guidance (Spinal) Indication(s): Assistance in needle guidance and placement for procedures requiring needle placement in or near specific anatomical locations not easily accessible without such assistance. Exposure Time: Please see nurses notes. Contrast: None used. Fluoroscopic Guidance: I was personally present during the use of fluoroscopy. "Tunnel Vision Technique" used to obtain the best possible view of the target area. Parallax error corrected before commencing the procedure. "Direction-depth-direction" technique used to introduce the needle under continuous pulsed fluoroscopy. Once target was reached, antero-posterior, oblique, and lateral fluoroscopic projection used confirm needle placement in all planes. Images permanently stored in EMR. Interpretation: No contrast injected. I personally interpreted the imaging intraoperatively. Adequate needle placement confirmed in multiple planes. Permanent images saved into the patient's record.  Antibiotic Prophylaxis:   Anti-infectives (From admission, onward)   None     Indication(s): None identified  Post-operative Assessment:  Post-procedure Vital Signs:  Pulse/HCG Rate: 62(!) 58 Temp: 98.6 F (37 C) Resp: 18 BP: 115/66 SpO2: 98 %  EBL:  None  Complications: No immediate post-treatment complications observed by team, or reported by patient.  Note: The patient tolerated the entire procedure well. A repeat set of vitals were taken after the procedure and the patient was kept under observation following institutional policy, for this type of procedure. Post-procedural neurological assessment was performed, showing return to baseline, prior to discharge. The patient was provided with post-procedure discharge instructions, including a section on how to identify potential problems. Should any problems arise concerning this procedure, the patient was given instructions to immediately contact us, at any time, without hesitation. In any case, we plan to contact the patient by telephone for a follow-up status report regarding this interventional procedure.  Comments:  No additional relevant information.  Plan of Care  Orders:  Orders Placed This Encounter  Procedures   CERVICAL FACET (MEDIAL BRANCH NERVE BLOCK)     Scheduling Instructions:     Side: Left-sided     Level: C3-4, C4-5, C5-6 Facet joints (C3, C4, C5, C6, & C7 Medial Branch Nerves)     Sedation: With Sedation.     Timeframe: Today    Order Specific Question:   Where will this procedure be performed?    Answer:   [facility]   DG PAIN CLIN[facility] C-ARM 1-60 MIN NO REPORT    Intraoperative interpretation by procedural physician at [facility].    Standing Status:   Standing    Number of Occurrences:   1    Order Specific Question:   Reason for exam:    Answer:   Assistance in needle guidance and placement for procedures requiring needle placement in or near specific anatomical locations not easily accessible without such assistance.   Informed Consent Details: Transcribe to consent form and obtain patient signature    Surgeon: Dashana Guizar A. Dossie Arbour, MD    Scheduling Instructions:  Procedure: Bilateral Cervical facet block under fluoroscopic guidance.      Indications: Chronic neck pain secondary to cervical facet syndrome   Provider attestation of informed consent for procedure/surgical case    I, the ordering provider, attest that I have discussed with the patient the benefits, risks, side effects, alternatives, likelihood of achieving goals and potential problems during recovery for the procedure that I have provided informed consent.    Standing Status:   Standing    Number of Occurrences:   1   Medications ordered for procedure: Meds ordered this encounter  Medications   lidocaine (XYLOCAINE) 2 % (with pres) injection 400 mg   lactated ringers infusion 1,000 mL   midazolam (VERSED) 5 MG/5ML injection 1-2 mg    Make sure Flumazenil is available in the pyxis when using this medication. If oversedation occurs, administer 0.2 mg IV over 15 sec. If after 45 sec no response, administer 0.2 mg again over 1 min; may repeat at 1 min intervals; not to exceed 4 doses (1 mg)   fentaNYL (SUBLIMAZE) injection 25-50 mcg    Make sure Narcan is available in the pyxis when using this medication. In the event of respiratory depression (RR< 8/min): Titrate NARCAN (naloxone) in increments of 0.1 to 0.2 mg IV at 2-3 minute intervals, until desired degree of reversal.   ropivacaine (PF) 2 mg/mL (0.2%) (NAROPIN) injection 9 mL   dexamethasone (DECADRON) injection 10 mg   Medications administered: We administered lidocaine, lactated ringers, midazolam, fentaNYL, ropivacaine (PF) 2 mg/mL (0.2%), and dexamethasone.  See the medical record for exact dosing, route, and time of administration.  Disposition: Discharge home  Discharge Date & Time: 04/01/2019; 1535 hrs.   Follow-up plan:   Return in about 2 weeks (around 04/15/2019) for Evaluation, (Post-proc), (Virtual Visit).     Future Appointments  Date Time Provider [place]  04/19/2019 10:15 AM Milinda Pointer, MD [facility] None  06/23/2019  1:15 PM Milinda Pointer, MD [facility] None    Primary Care Physician: Pleas Koch, NP Location: [facility] Note by: Gaspar Cola, MD Date: 04/01/2019; Time: 3:31 PM  Disclaimer:  Medicine is not an Chief Strategy Officer. The only guarantee in medicine is that nothing is guaranteed. It is important to note that the decision to proceed with this intervention was based on the information collected from the patient. The Data and conclusions were drawn from the patient's questionnaire, the interview, and the physical examination. Because the information was provided in large part by the patient, it cannot be guaranteed that it has not been purposely or unconsciously manipulated. Every effort has been made to obtain as much relevant data as possible for this evaluation. It is important to note that the conclusions that lead to this procedure are derived in large part from the available data. Always take into account that the treatment will also be dependent on availability of resources and existing treatment guidelines, considered by other Pain Management Practitioners as being common knowledge and practice, at the time of the intervention. For Medico-Legal purposes, it is also important to point out that variation in procedural techniques and pharmacological choices are the acceptable norm. The indications, contraindications, technique, and results of the above procedure should only be interpreted and judged by a Board-Certified Interventional Pain Specialist with extensive familiarity and expertise in the same exact procedure and technique.

## 2019-04-02 ENCOUNTER — Telehealth: Payer: Self-pay | Admitting: *Deleted

## 2019-04-02 NOTE — Telephone Encounter (Signed)
No problems post procedure. 

## 2019-04-15 ENCOUNTER — Encounter: Payer: Self-pay | Admitting: Pain Medicine

## 2019-04-19 ENCOUNTER — Other Ambulatory Visit: Payer: Self-pay

## 2019-04-19 ENCOUNTER — Ambulatory Visit: Payer: PPO | Attending: Pain Medicine | Admitting: Pain Medicine

## 2019-04-19 DIAGNOSIS — M47812 Spondylosis without myelopathy or radiculopathy, cervical region: Secondary | ICD-10-CM

## 2019-04-19 DIAGNOSIS — M5481 Occipital neuralgia: Secondary | ICD-10-CM

## 2019-04-19 DIAGNOSIS — M5442 Lumbago with sciatica, left side: Secondary | ICD-10-CM

## 2019-04-19 DIAGNOSIS — M5441 Lumbago with sciatica, right side: Secondary | ICD-10-CM | POA: Diagnosis not present

## 2019-04-19 DIAGNOSIS — G8929 Other chronic pain: Secondary | ICD-10-CM

## 2019-04-19 DIAGNOSIS — G894 Chronic pain syndrome: Secondary | ICD-10-CM | POA: Diagnosis not present

## 2019-04-19 DIAGNOSIS — M25552 Pain in left hip: Secondary | ICD-10-CM

## 2019-04-19 DIAGNOSIS — M79604 Pain in right leg: Secondary | ICD-10-CM | POA: Diagnosis not present

## 2019-04-19 DIAGNOSIS — M25551 Pain in right hip: Secondary | ICD-10-CM | POA: Diagnosis not present

## 2019-04-19 DIAGNOSIS — M542 Cervicalgia: Secondary | ICD-10-CM

## 2019-04-19 DIAGNOSIS — M79605 Pain in left leg: Secondary | ICD-10-CM | POA: Diagnosis not present

## 2019-04-19 NOTE — Patient Instructions (Addendum)
____________________________________________________________________________________________  Medication Rules  Purpose: To inform patients, and their family members, of our rules and regulations.  Applies to: All patients receiving prescriptions (written or electronic).  Pharmacy of record: Pharmacy where electronic prescriptions will be sent. If written prescriptions are taken to a different pharmacy, please inform the nursing staff. The pharmacy listed in the electronic medical record should be the one where you would like electronic prescriptions to be sent.  Electronic prescriptions: In compliance with the [place] Strengthen Opioid Misuse Prevention (STOP) Act of 2017 (Session Law 2017-74/H243), effective October 28, 2018, all controlled substances must be electronically prescribed. Calling prescriptions to the pharmacy will cease to exist.  Prescription refills: Only during scheduled appointments. Applies to all prescriptions.  NOTE: The following applies primarily to controlled substances (Opioid* Pain Medications).   Patient's responsibilities: 1. Pain Pills: Bring all pain pills to every appointment (except for procedure appointments). 2. Pill Bottles: Bring pills in original pharmacy bottle. Always bring the newest bottle. Bring bottle, even if empty. 3. Medication refills: You are responsible for knowing and keeping track of what medications you take and those you need refilled. The day before your appointment: write a list of all prescriptions that need to be refilled. The day of the appointment: give the list to the admitting nurse. Prescriptions will be written only during appointments. No prescriptions will be written on procedure days. If you forget a medication: it will not be "Called in", "Faxed", or "electronically sent". You will need to get another appointment to get these prescribed. No early refills. Do not call asking to have your prescription filled  early. 4. Prescription Accuracy: You are responsible for carefully inspecting your prescriptions before leaving our office. Have the discharge nurse carefully go over each prescription with you, before taking them home. Make sure that your name is accurately spelled, that your address is correct. Check the name and dose of your medication to make sure it is accurate. Check the number of pills, and the written instructions to make sure they are clear and accurate. Make sure that you are given enough medication to last until your next medication refill appointment. 5. Taking Medication: Take medication as prescribed. When it comes to controlled substances, taking less pills or less frequently than prescribed is permitted and encouraged. Never take more pills than instructed. Never take medication more frequently than prescribed.  6. Inform other Doctors: Always inform, all of your healthcare providers, of all the medications you take. 7. Pain Medication from other Providers: You are not allowed to accept any additional pain medication from any other Doctor or Healthcare provider. There are two exceptions to this rule. (see below) In the event that you require additional pain medication, you are responsible for notifying us, as stated below. 8. Medication Agreement: You are responsible for carefully reading and following our Medication Agreement. This must be signed before receiving any prescriptions from our practice. Safely store a copy of your signed Agreement. Violations to the Agreement will result in no further prescriptions. (Additional copies of our Medication Agreement are available upon request.) 9. Laws, Rules, & Regulations: All patients are expected to follow all Federal and State Laws, Statutes, Rules, & Regulations. Ignorance of the Laws does not constitute a valid excuse. The use of any illegal substances is prohibited. 10. Adopted CDC guidelines & recommendations: Target dosing levels will be  at or below 60 MME/day. Use of benzodiazepines** is not recommended.  Exceptions: There are only two exceptions to the rule of not   receiving pain medications from other Healthcare Providers. 1. Exception #1 (Emergencies): In the event of an emergency (i.e.: accident requiring emergency care), you are allowed to receive additional pain medication. However, you are responsible for: As soon as you are able, call our office [phone], at any time of the day or night, and leave a message stating your name, the date and nature of the emergency, and the name and dose of the medication prescribed. In the event that your call is answered by a member of our staff, make sure to document and save the date, time, and the name of the person that took your information.  2. Exception #2 (Planned Surgery): In the event that you are scheduled by another doctor or dentist to have any type of surgery or procedure, you are allowed (for a period no longer than 30 days), to receive additional pain medication, for the acute post-op pain. However, in this case, you are responsible for picking up a copy of our "Post-op Pain Management for Surgeons" handout, and giving it to your surgeon or dentist. This document is available at our office, and does not require an appointment to obtain it. Simply go to our office during business hours (Monday-Thursday from 8:00 AM to 4:00 PM) (Friday 8:00 AM to 12:00 Noon) or if you have a scheduled appointment with us, prior to your surgery, and ask for it by name. In addition, you will need to provide us with your name, name of your surgeon, type of surgery, and date of procedure or surgery.  *Opioid medications include: morphine, codeine, oxycodone, oxymorphone, hydrocodone, hydromorphone, meperidine, tramadol, tapentadol, buprenorphine, fentanyl, methadone. **Benzodiazepine medications include: diazepam (Valium), alprazolam (Xanax), clonazepam (Klonopine), lorazepam (Ativan), clorazepate  (Tranxene), chlordiazepoxide (Librium), estazolam (Prosom), oxazepam (Serax), temazepam (Restoril), triazolam (Halcion) (Last updated: 12/25/2017) ____________________________________________________________________________________________   ____________________________________________________________________________________________  Medication Recommendations and Reminders  Applies to: All patients receiving prescriptions (written and/or electronic).  Medication Rules & Regulations: These rules and regulations exist for your safety and that of others. They are not flexible and neither are we. Dismissing or ignoring them will be considered "non-compliance" with medication therapy, resulting in complete and irreversible termination of such therapy. (See document titled "Medication Rules" for more details.) In all conscience, because of safety reasons, we cannot continue providing a therapy where the patient does not follow instructions.  Pharmacy of record:   Definition: This is the pharmacy where your electronic prescriptions will be sent.   We do not endorse any particular pharmacy.  You are not restricted in your choice of pharmacy.  The pharmacy listed in the electronic medical record should be the one where you want electronic prescriptions to be sent.  If you choose to change pharmacy, simply notify our nursing staff of your choice of new pharmacy.  Recommendations:  Keep all of your pain medications in a safe place, under lock and key, even if you live alone.   After you fill your prescription, take 1 week's worth of pills and put them away in a safe place. You should keep a separate, properly labeled bottle for this purpose. The remainder should be kept in the original bottle. Use this as your primary supply, until it runs out. Once it's gone, then you know that you have 1 week's worth of medicine, and it is time to come in for a prescription refill. If you do this correctly, it  is unlikely that you will ever run out of medicine.  To make sure that the above recommendation works,   it is very important that you make sure your medication refill appointments are scheduled at least 1 week before you run out of medicine. To do this in an effective manner, make sure that you do not leave the office without scheduling your next medication management appointment. Always ask the nursing staff to show you in your prescription , when your medication will be running out. Then arrange for the receptionist to get you a return appointment, at least 7 days before you run out of medicine. Do not wait until you have 1 or 2 pills left, to come in. This is very poor planning and does not take into consideration that we may need to cancel appointments due to bad weather, sickness, or emergencies affecting our staff.  "Partial Fill": If for any reason your pharmacy does not have enough pills/tablets to completely fill or refill your prescription, do not allow for a "partial fill". You will need a separate prescription to fill the remaining amount, which we will not provide. If the reason for the partial fill is your insurance, you will need to talk to the pharmacist about payment alternatives for the remaining tablets, but again, do not accept a partial fill.  Prescription refills and/or changes in medication(s):   Prescription refills, and/or changes in dose or medication, will be conducted only during scheduled medication management appointments. (Applies to both, written and electronic prescriptions.)  No refills on procedure days. No medication will be changed or started on procedure days. No changes, adjustments, and/or refills will be conducted on a procedure day. Doing so will interfere with the diagnostic portion of the procedure.  No phone refills. No medications will be "called into the pharmacy".  No Fax refills.  No weekend refills.  No Holliday refills.  No after hours  refills.  Remember:  Business hours are:  Monday to Thursday 8:00 AM to 4:00 PM Provider's Schedule: Crystal King, NP - Appointments are:  Medication management: Monday to Thursday 8:00 AM to 4:00 PM Avonelle Viveros, MD - Appointments are:  Medication management: Monday and Wednesday 8:00 AM to 4:00 PM Procedure day: Tuesday and Thursday 7:30 AM to 4:00 PM Bilal Lateef, MD - Appointments are:  Medication management: Tuesday and Thursday 8:00 AM to 4:00 PM Procedure day: Monday and Wednesday 7:30 AM to 4:00 PM (Last update: 12/25/2017) ____________________________________________________________________________________________   ____________________________________________________________________________________________  CANNABIDIOL (AKA: CBD Oil or Pills)  Applies to: All patients receiving prescriptions of controlled substances (written and/or electronic).  General Information: Cannabidiol (CBD) was discovered in 1940. It is one of some 113 identified cannabinoids in cannabis (Marijuana) plants, accounting for up to 40% of the plant's extract. As of 2018, preliminary clinical research on cannabidiol included studies of anxiety, cognition, movement disorders, and pain.  Cannabidiol is consummed in multiple ways, including inhalation of cannabis smoke or vapor, as an aerosol spray into the cheek, and by mouth. It may be supplied as CBD oil containing CBD as the active ingredient (no added tetrahydrocannabinol (THC) or terpenes), a full-plant CBD-dominant hemp extract oil, capsules, dried cannabis, or as a liquid solution. CBD is thought not have the same psychoactivity as THC, and may affect the actions of THC. Studies suggest that CBD may interact with different biological targets, including cannabinoid receptors and other neurotransmitter receptors. As of 2018 the mechanism of action for its biological effects has not been determined.  In the [place], cannabidiol has a limited  approval by the Food and Drug Administration (FDA) for treatment of only two types   of epilepsy disorders. The side effects of long-term use of the drug include somnolence, decreased appetite, diarrhea, fatigue, malaise, weakness, sleeping problems, and others.  CBD remains a Schedule I drug prohibited for any use.  Legality: Some manufacturers ship CBD products nationally, an illegal action which the FDA has not enforced in 2018, with CBD remaining the subject of an FDA investigational new drug evaluation, and is not considered legal as a dietary supplement or food ingredient as of December 2018. Federal illegality has made it difficult historically to conduct research on CBD. CBD is openly sold in head shops and health food stores in some states where such sales have not been explicitly legalized.  Warning: Because it is not FDA approved for general use or treatment of pain, it is not required to undergo the same manufacturing controls as prescription drugs.  This means that the available cannabidiol (CBD) may be contaminated with THC.  If this is the case, it will trigger a positive urine drug screen (UDS) test for cannabinoids (Marijuana).  Because a positive UDS for illicit substances is a violation of our medication agreement, your opioid analgesics (pain medicine) may be permanently discontinued. (Last update: 01/15/2018) ____________________________________________________________________________________________   ____________________________________________________________________________________________  Preparing for Procedure with Sedation  Procedure appointments are limited to planned procedures: . No Prescription Refills. . No disability issues will be discussed. . No medication changes will be discussed.  Instructions: . Oral Intake: Do not eat or drink anything for at least 8 hours prior to your procedure. . Transportation: Public transportation is not allowed. Bring an adult  driver. The driver must be physically present in our waiting room before any procedure can be started. Marland Kitchen Physical Assistance: Bring an adult physically capable of assisting you, in the event you need help. This adult should keep you company at home for at least 6 hours after the procedure. . Blood Pressure Medicine: Take your blood pressure medicine with a sip of water the morning of the procedure. . Blood thinners: Notify our staff if you are taking any blood thinners. Depending on which one you take, there will be specific instructions on how and when to stop it. . Diabetics on insulin: Notify the staff so that you can be scheduled 1st case in the morning. If your diabetes requires high dose insulin, take only  of your normal insulin dose the morning of the procedure and notify the staff that you have done so. . Preventing infections: Shower with an antibacterial soap the morning of your procedure. . Build-up your immune system: Take 1000 mg of Vitamin C with every meal (3 times a day) the day prior to your procedure. Marland Kitchen Antibiotics: Inform the staff if you have a condition or reason that requires you to take antibiotics before dental procedures. . Pregnancy: If you are pregnant, call and cancel the procedure. . Sickness: If you have a cold, fever, or any active infections, call and cancel the procedure. . Arrival: You must be in the facility at least 30 minutes prior to your scheduled procedure. . Children: Do not bring children with you. . Dress appropriately: Bring dark clothing that you would not mind if they get stained. . Valuables: Do not bring any jewelry or valuables.  Reasons to call and reschedule or cancel your procedure: (Following these recommendations will minimize the risk of a serious complication.) . Surgeries: Avoid having procedures within 2 weeks of any surgery. (Avoid for 2 weeks before or after any surgery). . Flu Shots: Avoid having procedures within  2 weeks of a flu shots  or . (Avoid for 2 weeks before or after immunizations). . Barium: Avoid having a procedure within 7-10 days after having had a radiological study involving the use of radiological contrast. (Myelograms, Barium swallow or enema study). . Heart attacks: Avoid any elective procedures or surgeries for the initial 6 months after a "Myocardial Infarction" (Heart Attack). . Blood thinners: It is imperative that you stop these medications before procedures. Let us know if you if you take any blood thinner.  . Infection: Avoid procedures during or within two weeks of an infection (including chest colds or gastrointestinal problems). Symptoms associated with infections include: Localized redness, fever, chills, night sweats or profuse sweating, burning sensation when voiding, cough, congestion, stuffiness, runny nose, sore throat, diarrhea, nausea, vomiting, cold or Flu symptoms, recent or current infections. It is specially important if the infection is over the area that we intend to treat. Marland Kitchen Heart and lung problems: Symptoms that may suggest an active cardiopulmonary problem include: cough, chest pain, breathing difficulties or shortness of breath, dizziness, ankle swelling, uncontrolled high or unusually low blood pressure, and/or palpitations. If you are experiencing any of these symptoms, cancel your procedure and contact your primary care physician for an evaluation.  Remember:  Regular Business hours are:  Monday to Thursday 8:00 AM to 4:00 PM  Provider's Schedule: Milinda Pointer, MD:  Procedure days: Tuesday and Thursday 7:30 AM to 4:00 PM  Gillis Santa, MD:  Procedure days: Monday and Wednesday 7:30 AM to 4:00 PM ____________________________________________________________________________________________

## 2019-04-19 NOTE — Progress Notes (Signed)
Pain Management Virtual Encounter Note - Virtual Visit via Telephone Telehealth (real-time audio visits between healthcare provider and patient).   Patient's Phone No. & Preferred Pharmacy:  [phone] (home); [phone] (mobile); (Preferred) [phone] [email or web address] [address] [phone] [address] [place] [address] Phone: [phone] Fax: [phone]    Pre-screening note:  Our staff contacted Karen Edwards and offered her an "in person", "face-to-face" appointment versus a telephone encounter. She indicated preferring the telephone encounter, at this time.   Reason for Virtual Visit: COVID-19*  Social distancing based on CDC and AMA recommendations.   I contacted Karen Edwards on 04/19/2019 via telephone.      I clearly identified myself as Gaspar Cola, MD. I verified that I was speaking with the correct person using two identifiers (Name: Karen Edwards, and date of birth: December 03, 1965).  Advanced Informed Consent I sought verbal advanced consent from Karen Edwards for virtual visit interactions. I informed Karen Edwards of possible security and privacy concerns, risks, and limitations associated with providing "not-in-person" medical evaluation and management services. I also informed Karen Edwards of the availability of "in-person" appointments. Finally, I informed her that there would be a charge for the virtual visit and that she could be  personally, fully or partially, financially responsible for it. Karen Edwards expressed understanding and agreed to proceed.   Historic Elements   Karen Edwards is a 53 y.o. year old, female patient evaluated today after her last encounter by our practice on 04/02/2019. Karen Edwards  has a past medical history of Anxiety, Chronic back pain, Degenerative joint disease, Depression, Dyspnea, Hypotension, IBS (irritable bowel syndrome) (05/2015), and MVP  (mitral valve prolapse). She also  has a past surgical history that includes Cesarean section; Ankle surgery (Right, 03/02/2014); Hemorrhoid surgery (N/A, 06/16/2015); Cervical spine surgery (02/21/2016); Cesarean section with bilateral tubal ligation (1990); Colonoscopy (04/19/2015); Hysteroscopy w/D&C (12/24/2013); Colonoscopy with propofol (N/A, 05/18/2018); Esophagogastroduodenoscopy (egd) with propofol (N/A, 05/18/2018); and Spinal cord stimulator insertion (N/A, 06/18/2018). Karen Edwards has a current medication list which includes the following prescription(s): cascara sagrada, b-12, flax seed oil, fluoxetine, hydrocodone-acetaminophen, hydrocodone-acetaminophen, hydrocodone-acetaminophen, methocarbamol, and trazodone. She  reports that she has been smoking cigarettes. She has a 18.50 pack-year smoking history. She has never used smokeless tobacco. She reports current alcohol use of about 1.0 standard drinks of alcohol per week. She reports that she does not use drugs. Karen Edwards is allergic to no known allergies.   HPI  Today, she is being contacted for a post-procedure assessment.  On 09/17/2018 the patient had a right-sided cervical facet radiofrequency ablation under fluoroscopic guidance and IV sedation.  This provided the patient with excellent long-term benefit.  At this time, she is coming back for a diagnostic left-sided cervical facet block.  Previously she had a bilateral cervical facet block that did provide her with good relief of the pain, unfortunately it has not lasted.  According to the patient the results of this diagnostic left-sided cervical facet block were similar in the sense that she obtained 90% relief of her cervicalgia, on the left side, for the duration of the local anesthetic.  Unfortunately it did not last long and therefore it is my opinion that it is medically indicated to proceed with the radiofrequency ablation for the purpose of extending the benefit in a similar manner as  we did attain on the right side.  We have already made  arrangements to have the patient come back for the left-sided cervical facet radiofrequency ablation on April 29, 2019.  Pharmacotherapy Assessment  Analgesic: Hydrocodone/APAP 5/325 1 tablet p.o. twice daily MME/day:10mg /day  Monitoring: Pharmacotherapy: No side-effects or adverse reactions reported. [place] PMP: PDMP reviewed during this encounter.       Compliance: No problems identified. Effectiveness: Clinically acceptable. Plan: Refer to "POC".  Post-Procedure Evaluation  Procedure: Diagnostic left-sided cervical facet block under fluoroscopic guidance and IV sedation Pre-procedure pain level:  4/10 Post-procedure: 2/10 50% relief  Sedation: Please see nurses note.  Effectiveness during initial hour after procedure(Ultra-Short Term Relief): 90 %   Local anesthetic used: Long-acting (4-6 hours) Effectiveness: Defined as any analgesic benefit obtained secondary to the administration of local anesthetics. This carries significant diagnostic value as to the etiological location, or anatomical origin, of the pain. Duration of benefit is expected to coincide with the duration of the local anesthetic used.  Effectiveness during initial 4-6 hours after procedure(Short-Term Relief): 90 %   Long-term benefit: Defined as any relief past the pharmacologic duration of the local anesthetics.  Effectiveness past the initial 6 hours after procedure(Long-Term Relief): 0 %   Current benefits: Defined as benefit that persist at this time.   Analgesia:  Back to baseline Function: Back to baseline ROM: Back to baseline  Pertinent Labs   SAFETY SCREENING Profile Lab Results  Component Value Date   SARSCOV2NAA NOT DETECTED 03/26/2019   COVIDSOURCE NASOPHARYNGEAL 03/26/2019   STAPHAUREUS NEGATIVE 06/11/2018   MRSAPCR NEGATIVE 06/11/2018   PREGTESTUR NEGATIVE 06/16/2015   Renal Function Lab Results  Component Value Date   BUN 7 09/08/2017    CREATININE 0.84 09/08/2017   BCR 8 (L) 09/08/2017   GFRAA 93 09/08/2017   GFRNONAA 81 09/08/2017   Hepatic Function Lab Results  Component Value Date   AST 18 09/08/2017   ALT 12 (L) 01/30/2017   ALBUMIN 4.7 09/08/2017   UDS Summary  Date Value Ref Range Status  04/06/2018 FINAL  Final    Comment:    ==================================================================== TOXASSURE SELECT 13 (MW) ==================================================================== Test                             Result       Flag       Units Drug Present and Declared for Prescription Verification   Norhydrocodone                 228          EXPECTED   ng/mg creat    Norhydrocodone is an expected metabolite of hydrocodone. Drug Absent but Declared for Prescription Verification   Hydrocodone                    Not Detected UNEXPECTED ng/mg creat    Hydrocodone is almost always present in patients taking this drug    consistently. Absence of hydrocodone could be due to lapse of    time since the last dose or unusual pharmacokinetics (rapid    metabolism). ==================================================================== Test                      Result    Flag   Units      Ref Range   Creatinine              60               mg/dL      >=  20 ==================================================================== Declared Medications:  The flagging and interpretation on this report are based on the  following declared medications.  Unexpected results may arise from  inaccuracies in the declared medications.  **Note: The testing scope of this panel includes these medications:  Hydrocodone (Norco)  **Note: The testing scope of this panel does not include following  reported medications:  Acetaminophen (Norco)  Atorvastatin (Lipitor)  Cyanocobalamin  Fluoxetine (Prozac)  Methocarbamol (Robaxin)  Potassium  Supplement  Trazodone  (Desyrel) ==================================================================== For clinical consultation, please call [phone]. ====================================================================    Note: Above Lab results reviewed.  Recent imaging  DG C-Arm 1-60 Min-No Report Fluoroscopy was utilized by the requesting physician.  No radiographic  interpretation.   Assessment  The primary encounter diagnosis was Chronic pain syndrome. Diagnoses of Chronic low back pain (Primary Area of Pain) (Bilateral) (R>L), Chronic lower extremity pain (Secondary Area of Pain) (Bilateral) (R>L), Chronic hip pain (Tertiary Area of Pain) (Bilateral) (R>L), Chronic neck pain (Fourth Area of Pain) (Bilateral) (R>L), Chronic occipital neuralgia (Fifth Area of Pain) (Bilateral) (R>L), and Cervical facet syndrome (Bilateral) (R>L) were also pertinent to this visit.  Plan of Care  I am having Karen Edwards maintain her B-12, traZODone, FLUoxetine, methocarbamol, Cascara Sagrada, Flax Seed Oil, HYDROcodone-acetaminophen, HYDROcodone-acetaminophen, and HYDROcodone-acetaminophen.  Pharmacotherapy (Medications Ordered): No orders of the defined types were placed in this encounter.  Orders:  No orders of the defined types were placed in this encounter.  Follow-up plan:   Return for (VV), E/M (MM), scheduled appointment, in addition, RFA: (L) C-FCT.  (April 29, 2019)   Recent Visits Date Type Provider Dept  04/01/19 Procedure visit Milinda Pointer, MD [facility]  03/24/19 Office Visit Milinda Pointer, MD [facility]  02/23/19 Office Visit Milinda Pointer, MD [facility]  02/09/19 Appointment Vevelyn Francois, NP [facility] recent visits within past 90 days and meeting all other requirements   Today's Visits Date Type Provider Dept  04/19/19 Office Visit Milinda Pointer, MD [facility]  Showing today's visits and meeting all  other requirements   Future Appointments Date Type Provider Dept  04/29/19 Appointment Milinda Pointer, MD [facility]  06/23/19 Appointment Milinda Pointer, MD [facility]  Showing future appointments within next 90 days and meeting all other requirements   I discussed the assessment and treatment plan with the patient. The patient was provided an opportunity to ask questions and all were answered. The patient agreed with the plan and demonstrated an understanding of the instructions.  Patient advised to call back or seek an in-person evaluation if the symptoms or condition worsens.  Total duration of non-face-to-face encounter: 12 minutes.  Note by: Gaspar Cola, MD Date: 04/19/2019; Time: 10:30 AM  Note: This dictation was prepared with Dragon dictation. Any transcriptional errors that may result from this process are unintentional.  Disclaimer:  * Given the special circumstances of the COVID-19 pandemic, the federal government has announced that the Office for Civil Rights (OCR) will exercise its enforcement discretion and will not impose penalties on physicians using telehealth in the event of noncompliance with regulatory requirements under the [place] and [place] (HIPAA) in connection with the good faith provision of telehealth during the ZSWFU-93 national public health emergency. ([place])

## 2019-04-26 ENCOUNTER — Other Ambulatory Visit: Admission: RE | Admit: 2019-04-26 | Payer: PPO | Source: Ambulatory Visit

## 2019-04-29 ENCOUNTER — Ambulatory Visit: Payer: PPO | Admitting: Pain Medicine

## 2019-04-30 ENCOUNTER — Other Ambulatory Visit
Admission: RE | Admit: 2019-04-30 | Discharge: 2019-04-30 | Disposition: A | Discharge status: Home / Self Care | Payer: PPO | Source: Ambulatory Visit | Attending: Pain Medicine | Admitting: Pain Medicine

## 2019-04-30 ENCOUNTER — Other Ambulatory Visit: Payer: Self-pay

## 2019-04-30 DIAGNOSIS — Z1159 Encounter for screening for other viral diseases: Secondary | ICD-10-CM | POA: Diagnosis not present

## 2019-04-30 DIAGNOSIS — Z01812 Encounter for preprocedural laboratory examination: Secondary | ICD-10-CM | POA: Diagnosis not present

## 2019-05-01 LAB — SARS CORONAVIRUS 2 (TAT 6-24 HRS): SARS Coronavirus 2: NEGATIVE

## 2019-05-04 ENCOUNTER — Other Ambulatory Visit: Payer: Self-pay

## 2019-05-04 ENCOUNTER — Ambulatory Visit (HOSPITAL_BASED_OUTPATIENT_CLINIC_OR_DEPARTMENT_OTHER): Payer: PPO | Admitting: Pain Medicine

## 2019-05-04 ENCOUNTER — Encounter: Payer: Self-pay | Admitting: Pain Medicine

## 2019-05-04 ENCOUNTER — Ambulatory Visit
Admission: RE | Admit: 2019-05-04 | Discharge: 2019-05-04 | Disposition: A | Discharge status: Home / Self Care | Payer: PPO | Source: Ambulatory Visit | Attending: Pain Medicine | Admitting: Pain Medicine

## 2019-05-04 VITALS — BP 127/72 | HR 63 | Temp 97.0°F | Resp 14 | Ht 60.0 in | Wt 147.0 lb

## 2019-05-04 DIAGNOSIS — M25512 Pain in left shoulder: Secondary | ICD-10-CM

## 2019-05-04 DIAGNOSIS — M25511 Pain in right shoulder: Secondary | ICD-10-CM | POA: Diagnosis not present

## 2019-05-04 DIAGNOSIS — T85848A Pain due to other internal prosthetic devices, implants and grafts, initial encounter: Secondary | ICD-10-CM | POA: Diagnosis not present

## 2019-05-04 DIAGNOSIS — M4692 Unspecified inflammatory spondylopathy, cervical region: Secondary | ICD-10-CM | POA: Insufficient documentation

## 2019-05-04 DIAGNOSIS — M542 Cervicalgia: Secondary | ICD-10-CM | POA: Diagnosis not present

## 2019-05-04 DIAGNOSIS — G8918 Other acute postprocedural pain: Secondary | ICD-10-CM | POA: Diagnosis not present

## 2019-05-04 DIAGNOSIS — M47812 Spondylosis without myelopathy or radiculopathy, cervical region: Secondary | ICD-10-CM

## 2019-05-04 DIAGNOSIS — M549 Dorsalgia, unspecified: Secondary | ICD-10-CM | POA: Insufficient documentation

## 2019-05-04 DIAGNOSIS — G8929 Other chronic pain: Secondary | ICD-10-CM

## 2019-05-04 DIAGNOSIS — M5030 Other cervical disc degeneration, unspecified cervical region: Secondary | ICD-10-CM | POA: Diagnosis not present

## 2019-05-04 DIAGNOSIS — M5481 Occipital neuralgia: Secondary | ICD-10-CM

## 2019-05-04 MED ORDER — MIDAZOLAM HCL 5 MG/5ML IJ SOLN
1.0000 mg | INTRAMUSCULAR | Status: DC | PRN
  Administered 2019-05-04: 11:00:00 2 mg via INTRAVENOUS
  Filled 2019-05-04: qty 5

## 2019-05-04 MED ORDER — ROPIVACAINE HCL 2 MG/ML IJ SOLN
9.0000 mL | Freq: Once | INTRAMUSCULAR | Status: AC
  Administered 2019-05-04: 11:00:00 9 mL via PERINEURAL
  Filled 2019-05-04: qty 10

## 2019-05-04 MED ORDER — OXYCODONE-ACETAMINOPHEN 5-325 MG PO TABS: 1.0000 | ORAL_TABLET | Freq: Three times a day (TID) | ORAL | 0 refills | Status: AC | PRN

## 2019-05-04 MED ORDER — DEXAMETHASONE SODIUM PHOSPHATE 10 MG/ML IJ SOLN
10.0000 mg | Freq: Once | INTRAMUSCULAR | Status: AC
  Administered 2019-05-04: 10 mg
  Filled 2019-05-04: qty 1

## 2019-05-04 MED ORDER — LACTATED RINGERS IV SOLN
1000.0000 mL | Freq: Once | INTRAVENOUS | Status: AC
  Administered 2019-05-04: 1000 mL via INTRAVENOUS

## 2019-05-04 MED ORDER — FENTANYL CITRATE (PF) 100 MCG/2ML IJ SOLN
25.0000 ug | INTRAMUSCULAR | Status: DC | PRN
  Administered 2019-05-04: 25 ug via INTRAVENOUS
  Filled 2019-05-04: qty 2

## 2019-05-04 MED ORDER — ROPIVACAINE HCL 2 MG/ML IJ SOLN
4.0000 mL | Freq: Once | INTRAMUSCULAR | Status: AC
  Administered 2019-05-04: 11:00:00 4 mL
  Filled 2019-05-04: qty 10

## 2019-05-04 MED ORDER — LIDOCAINE HCL 2 % IJ SOLN
20.0000 mL | Freq: Once | INTRAMUSCULAR | Status: AC
  Administered 2019-05-04: 11:00:00 400 mg
  Filled 2019-05-04: qty 40

## 2019-05-04 MED ORDER — METHYLPREDNISOLONE ACETATE 80 MG/ML IJ SUSP
80.0000 mg | Freq: Once | INTRAMUSCULAR | Status: AC
  Administered 2019-05-04: 80 mg via INTRA_ARTICULAR
  Filled 2019-05-04: qty 1

## 2019-05-04 NOTE — Progress Notes (Signed)
Patient's Name: Karen Edwards  MRN: [id number]  Referring Provider: Pleas Koch, NP  DOB: 02/17/1966  PCP: Pleas Koch, NP  DOS: 05/04/2019  Note by: Gaspar Cola, MD  Service setting: Ambulatory outpatient  Specialty: Interventional Pain Management  Patient type: Established  Location: [facility] (AMB) Pain Management Facility  Visit type: Interventional Procedure   Primary Reason for Visit: Interventional Pain Management Treatment. CC: Shoulder Pain (left)  Procedure #1:  Anesthesia, Analgesia, Anxiolysis:  Type: Cervical Facet, Medial Branch Radiofrequency Ablation  #1  Primary Purpose: Therapeutic Region: Posterolateral cervical spine region Level: C3, C4, C5, C6, & C7 Medial Branch Level(s). Lesioning of these levels should completely denervate the C3-4, C4-5, C5-6, and the C6-7 cervical facet joints. Laterality: Left Paraspinal  Type: Moderate (Conscious) Sedation combined with Local Anesthesia Indication(s): Analgesia and Anxiety Route: Intravenous (IV) IV Access: Secured Sedation: Meaningful verbal contact was maintained at all times during the procedure  Local Anesthetic: Lidocaine 1-2%  Position: Prone with head of the table was raised to facilitate breathing.   Indications: 1. 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 Spondylosis without myelopathy or radiculopathy, cervical region   3. Cervicalgia   4. Chronic occipital neuralgia (Fifth Area of Pain) (Bilateral) (R>L)   5. DDD (degenerative disc disease), cervical   6. 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[facility])    Karen Edwards has been dealing with the above chronic pain for longer than three months and has either failed to respond, was unable to tolerate, or simply did not get enough benefit from other more conservative therapies including, but not limited to: 1. Over-the-counter medications 2. Anti-inflammatory medications 3. Muscle  relaxants 4. Membrane stabilizers 5. Opioids 6. Physical therapy and/or chiropractic manipulation 7. Modalities (Heat, ice, etc.) 8. Invasive techniques such as nerve blocks. Ms. Whitenight has attained more than 50% relief of the pain from a series of diagnostic injections conducted in separate occasions.  Procedure #2:  Anesthesia, Analgesia, Anxiolysis:  Type: PSIS Trigger Point Injection (1-2 muscle groups) #1  CPT: 20552 Primary Purpose: Diagnostic Region: Lower Lumbar Level: PSIS Target Area: PSIS Trigger Point Approach: Percutaneous, ipsilateral approach. Laterality: Left-Sided Paramedial  See above.   Indications: 1. Pain due to any device, implant or graft, initial encounter   2. Trigger point with back pain (Left)    Pain Score: Pre-procedure: 4 /10 Post-procedure: 0-No pain/10  Pre-op Assessment:  [name] is a 53 y.o. (year old), female patient, seen today for interventional treatment. She  has a past surgical history that includes Cesarean section; Ankle surgery (Right, 03/02/2014); Hemorrhoid surgery (N/A, 06/16/2015); Cervical spine surgery (02/21/2016); Cesarean section with bilateral tubal ligation (1990); Colonoscopy (04/19/2015); Hysteroscopy w/D&C (12/24/2013); Colonoscopy with propofol (N/A, 05/18/2018); Esophagogastroduodenoscopy (egd) with propofol (N/A, 05/18/2018); and Spinal cord stimulator insertion (N/A, 06/18/2018). Karen Edwards has a current medication list which includes the following prescription(s): b-12, flax seed oil, fluoxetine, hydrocodone-acetaminophen, hydrocodone-acetaminophen, methocarbamol, trazodone, [place], hydrocodone-acetaminophen, oxycodone-acetaminophen, and oxycodone-acetaminophen, and the following Facility-Administered Medications: fentanyl and midazolam. Her primarily concern today is the Shoulder Pain (left)  Initial Vital Signs:  Pulse/HCG Rate: 63ECG Heart Rate: (!) 58 Temp: 98.3 F (36.8 C) Resp: 16 BP: 117/71 SpO2: 99  %  BMI: Estimated body mass index is 28.71 kg/m as calculated from the following:   Height as of this encounter: 5' (1.524 m).   Weight as of this encounter: 147 lb (66.7 kg).  Risk Assessment: Allergies: Reviewed. She is allergic to  no known allergies.  Allergy Precautions: None required Coagulopathies: Reviewed. None identified.  Blood-thinner therapy: None at this time Active Infection(s): Reviewed. None identified. [name] is afebrile  Site Confirmation: Karen Edwards was asked to confirm the procedure and laterality before marking the site Procedure checklist: Completed Consent: Before the procedure and under the influence of no sedative(s), amnesic(s), or anxiolytics, the patient was informed of the treatment options, risks and possible complications. To fulfill our ethical and legal obligations, as recommended by the American Medical Association's Code of Ethics, I have informed the patient of my clinical impression; the nature and purpose of the treatment or procedure; the risks, benefits, and possible complications of the intervention; the alternatives, including doing nothing; the risk(s) and benefit(s) of the alternative treatment(s) or procedure(s); and the risk(s) and benefit(s) of doing nothing. The patient was provided information about the general risks and possible complications associated with the procedure. These may include, but are not limited to: failure to achieve desired goals, infection, bleeding, organ or nerve damage, allergic reactions, paralysis, and death. In addition, the patient was informed of those risks and complications associated to Spine-related procedures, such as failure to decrease pain; infection (i.e.: Meningitis, epidural or intraspinal abscess); bleeding (i.e.: epidural hematoma, subarachnoid hemorrhage, or any other type of intraspinal or peri-dural bleeding); organ or nerve damage (i.e.: Any type of peripheral nerve, nerve root, or spinal cord  injury) with subsequent damage to sensory, motor, and/or autonomic systems, resulting in permanent pain, numbness, and/or weakness of one or several areas of the body; allergic reactions; (i.e.: anaphylactic reaction); and/or death. Furthermore, the patient was informed of those risks and complications associated with the medications. These include, but are not limited to: allergic reactions (i.e.: anaphylactic or anaphylactoid reaction(s)); adrenal axis suppression; blood sugar elevation that in diabetics may result in ketoacidosis or comma; water retention that in patients with history of congestive heart failure may result in shortness of breath, pulmonary edema, and decompensation with resultant heart failure; weight gain; swelling or edema; medication-induced neural toxicity; particulate matter embolism and blood vessel occlusion with resultant organ, and/or nervous system infarction; and/or aseptic necrosis of one or more joints. Finally, the patient was informed that Medicine is not an exact science; therefore, there is also the possibility of unforeseen or unpredictable risks and/or possible complications that may result in a catastrophic outcome. The patient indicated having understood very clearly. We have given the patient no guarantees and we have made no promises. Enough time was given to the patient to ask questions, all of which were answered to the patient's satisfaction. Ms. Bradstreet has indicated that she wanted to continue with the procedure. Attestation: I, the ordering provider, attest that I have discussed with the patient the benefits, risks, side-effects, alternatives, likelihood of achieving goals, and potential problems during recovery for the procedure that I have provided informed consent. Date   Time: 05/04/2019 10:00 AM  Pre-Procedure Preparation:  Monitoring: As per clinic protocol. Respiration, ETCO2, SpO2, BP, heart rate and rhythm monitor placed and checked for adequate  function Safety Precautions: Patient was assessed for positional comfort and pressure points before starting the procedure. Time-out: I initiated and conducted the "Time-out" before starting the procedure, as per protocol. The patient was asked to participate by confirming the accuracy of the "Time Out" information. Verification of the correct person, site, and procedure were performed and confirmed by me, the nursing staff, and the patient. "Time-out" conducted as per Joint Commission's Universal Protocol ([email or web address].01.01.01). Time: 1034  Description of  Procedure #1:  Laterality: Left Level: C3, C4, C5, C6, & C7 Medial Branch Level(s). Area Prepped: Entire Posterior Cervico-thoracic Region Prepping solution: DuraPrep (Iodine Povacrylex [0.7% available iodine] and Isopropyl Alcohol, 74% w/w) Safety Precautions: Aspiration looking for blood return was conducted prior to all injections. At no point did we inject any substances, as a needle was being advanced. Before injecting, the patient was told to immediately notify me if she was experiencing any new onset of "ringing in the ears, or metallic taste in the mouth". No attempts were made at seeking any paresthesias. Safe injection practices and needle disposal techniques used. Medications properly checked for expiration dates. SDV (single dose vial) medications used. After the completion of the procedure, all disposable equipment used was discarded in the proper designated medical waste containers. Local Anesthesia: Protocol guidelines were followed. The patient was positioned over the fluoroscopy table. The area was prepped in the usual manner. The time-out was completed. The target area was identified using fluoroscopy. A 12-in long, straight, sterile hemostat was used with fluoroscopic guidance to locate the targets for each level blocked. Once located, the skin was marked with an approved surgical skin marker. Once all sites were marked, the skin (epidermis,  dermis, and hypodermis), as well as deeper tissues (fat, connective tissue and muscle) were infiltrated with a small amount of a short-acting local anesthetic, loaded on a 10cc syringe with a 25G, 1.5-in  Needle. An appropriate amount of time was allowed for local anesthetics to take effect before proceeding to the next step. Local Anesthetic: Lidocaine 2.0% The unused portion of the local anesthetic was discarded in the proper designated containers. Technical explanation of process:  Radiofrequency Ablation (RFA) C3 Medial Branch Nerve RFA: The target area for the C3 dorsal medial articular branch is the lateral concave waist of the articular pillar of C3. Under fluoroscopic guidance, a Radiofrequency needle was inserted until contact was made with os over the postero-lateral aspect of the articular pillar of C3 (target area). Sensory and motor testing was conducted to properly adjust the position of the needle. Once satisfactory placement of the needle was achieved, the numbing solution was slowly injected after negative aspiration for blood. 2.0 mL of the nerve block solution was injected without difficulty or complication. After waiting for at least 3 minutes, the ablation was performed. Once completed, the needle was removed intact. C4 Medial Branch Nerve RFA: The target area for the C4 dorsal medial articular branch is the lateral concave waist of the articular pillar of C4. Under fluoroscopic guidance, a Radiofrequency needle was inserted until contact was made with os over the postero-lateral aspect of the articular pillar of C4 (target area). Sensory and motor testing was conducted to properly adjust the position of the needle. Once satisfactory placement of the needle was achieved, the numbing solution was slowly injected after negative aspiration for blood. 2.0 mL of the nerve block solution was injected without difficulty or complication. After waiting for at least 3 minutes, the ablation was  performed. Once completed, the needle was removed intact. C5 Medial Branch Nerve RFA: The target area for the C5 dorsal medial articular branch is the lateral concave waist of the articular pillar of C5. Under fluoroscopic guidance, a Radiofrequency needle was inserted until contact was made with os over the postero-lateral aspect of the articular pillar of C5 (target area). Sensory and motor testing was conducted to properly adjust the position of the needle. Once satisfactory placement of the needle was achieved, the numbing solution was  slowly injected after negative aspiration for blood. 2.0 mL of the nerve block solution was injected without difficulty or complication. After waiting for at least 3 minutes, the ablation was performed. Once completed, the needle was removed intact. C6 Medial Branch Nerve RFA: The target area for the C6 dorsal medial articular branch is the lateral concave waist of the articular pillar of C6. Under fluoroscopic guidance, a Radiofrequency needle was inserted until contact was made with os over the postero-lateral aspect of the articular pillar of C6 (target area). Sensory and motor testing was conducted to properly adjust the position of the needle. Once satisfactory placement of the needle was achieved, the numbing solution was slowly injected after negative aspiration for blood. 2.0 mL of the nerve block solution was injected without difficulty or complication. After waiting for at least 3 minutes, the ablation was performed. Once completed, the needle was removed intact. C7 Medial Branch Nerve RFA: The target for the C7 dorsal medial articular branch lies on the superior-medial tip of the C7 transverse process. Under fluoroscopic guidance, a Radiofrequency needle was inserted until contact was made with os over the postero-lateral aspect of the articular pillar of C7 (target area). Sensory and motor testing was conducted to properly adjust the position of the needle. Once  satisfactory placement of the needle was achieved, the numbing solution was slowly injected after negative aspiration for blood. 2.0 mL of the nerve block solution was injected without difficulty or complication. After waiting for at least 3 minutes, the ablation was performed. Once completed, the needle was removed intact. Radiofrequency lesioning (ablation):  Radiofrequency Generator: NeuroTherm [id number] Sensory Stimulation Parameters: 50 Hz was used to locate & identify the nerve, making sure that the needle was positioned such that there was no sensory stimulation below 0.3 V or above 0.7 V. Motor Stimulation Parameters: 2 Hz was used to evaluate the motor component. Care was taken not to lesion any nerves that demonstrated motor stimulation of the lower extremities at an output of less than 2.5 times that of the sensory threshold, or a maximum of 2.0 V. Lesioning Technique Parameters: Standard Radiofrequency settings. (Not bipolar or pulsed.) Temperature Settings: 80 degrees C Lesioning time: 60 seconds Intra-operative Compliance: Compliant Materials & Medications: Needle(s) (Electrode/Cannula) Type: Teflon-coated, curved tip, Radiofrequency needle(s) Gauge: 22G Length: 10cm Numbing solution: 0.2% PF-Ropivacaine + Triamcinolone (40 mg/mL) diluted to a final concentration of 4 mg of Triamcinolone/mL of Ropivacaine The unused portion of the solution was discarded in the proper designated containers.  Once the entire procedure was completed, the treated area was cleaned, making sure to leave some of the prepping solution back to take advantage of its long term bactericidal properties. Intra-operative Compliance: Compliant  Anatomy Reference Guide:          Vitals:   05/04/19 1106 05/04/19 1116 05/04/19 1126 05/04/19 1136  BP: 115/75 (!) 114/59 131/69 127/72  Pulse:      Resp: 10 13 15 14   Temp:   (!) 97 F (36.1 C)   SpO2: 99% 95% 95% 98%  Weight:      Height:        Start Time:  1034 hrs.  Imaging Guidance (Spinal) for procedure #1:  Type of Imaging Technique: Fluoroscopy Guidance (Spinal) Indication(s): Assistance in needle guidance and placement for procedures requiring needle placement in or near specific anatomical locations not easily accessible without such assistance. Exposure Time: Please see nurses notes. Contrast: None used. Fluoroscopic Guidance: I was personally present during the use of fluoroscopy. "  Tunnel Vision Technique" used to obtain the best possible view of the target area. Parallax error corrected before commencing the procedure. "Direction-depth-direction" technique used to introduce the needle under continuous pulsed fluoroscopy. Once target was reached, antero-posterior, oblique, and lateral fluoroscopic projection used confirm needle placement in all planes. Images permanently stored in EMR. Interpretation: No contrast injected. I personally interpreted the imaging intraoperatively. Adequate needle placement confirmed in multiple planes. Permanent images saved into the patient's record.  Description of Procedure #2:  Area Prepped: Entire Lower Lumbar Region Prepping solution: DuraPrep (Iodine Povacrylex [0.7% available iodine] and Isopropyl Alcohol, 74% w/w) Safety Precautions: Aspiration looking for blood return was conducted prior to all injections. At no point did we inject any substances, as a needle was being advanced. No attempts were made at seeking any paresthesias. Safe injection practices and needle disposal techniques used. Medications properly checked for expiration dates. SDV (single dose vial) medications used. Description of the Procedure: Protocol guidelines were followed. The patient was placed in position over the fluoroscopy table. The target area was identified and the area prepped in the usual manner. Skin & deeper tissues infiltrated with local anesthetic. Appropriate amount of time allowed to pass for local anesthetics to take  effect. The procedure needles were then advanced to the target area. Proper needle placement secured. Negative aspiration confirmed. Solution injected in intermittent fashion, asking for systemic symptoms every 0.5cc of injectate. The needles were then removed and the area cleansed, making sure to leave some of the prepping solution back to take advantage of its long term bactericidal properties.  Vitals:   05/04/19 1101 05/04/19 1106 05/04/19 1116 05/04/19 1126  BP: 112/64 115/75 (!) 114/59   Pulse:      Resp: 16 10 13 15   Temp:    (!) 97 F (36.1 C)  SpO2: 98% 99% 95% 95%  Weight:      Height:       End Time: 1106 hrs. Materials:  Needle(s) Type: Spinal needle(s) Gauge: 22G Length: 3.5-in Medication(s): Please see orders for medications and dosing details.  Imaging Guidance for procedure #2:  Type of Imaging Technique: None used Indication(s): N/A Exposure Time: No patient exposure Contrast: None used. Fluoroscopic Guidance: N/A Ultrasound Guidance: N/A Interpretation: N/A  Antibiotic Prophylaxis:   Anti-infectives (From admission, onward)   None     Indication(s): None identified  Post-operative Assessment:  Post-procedure Vital Signs:  Pulse/HCG Rate: 63(!) 59 Temp: (!) 97 F (36.1 C) Resp: 14 BP: 127/72 SpO2: 98 %  EBL: None  Complications: No immediate post-treatment complications observed by team, or reported by patient.  Note: The patient tolerated the entire procedure well. A repeat set of vitals were taken after the procedure and the patient was kept under observation following institutional policy, for this type of procedure. Post-procedural neurological assessment was performed, showing return to baseline, prior to discharge. The patient was provided with post-procedure discharge instructions, including a section on how to identify potential problems. Should any problems arise concerning this procedure, the patient was given instructions to immediately  contact us, at any time, without hesitation. In any case, we plan to contact the patient by telephone for a follow-up status report regarding this interventional procedure.  Comments:  No additional relevant information.  Plan of Care  Orders:  Orders Placed This Encounter  Procedures   Radiofrequency,Cervical    Scheduling Instructions:     Side(s): Left-sided     Level(s): C3, C4, C5, C6, & C7 Medial Branch Nerve(s)  Sedation: With Sedation     Timeframe: Today    Order Specific Question:   Where will this procedure be performed?    Answer:   [facility]   TRIGGER POINT INJECTION    Scheduling Instructions:     Area: Lower Back     Side: Left     Sedation: None     Timeframe: Today    Order Specific Question:   Where will this procedure be performed?    Answer:   [facility]   DG PAIN CLINIC C-ARM 1-60 MIN NO REPORT    Intraoperative interpretation by procedural physician at [facility].    Standing Status:   Standing    Number of Occurrences:   1    Order Specific Question:   Reason for exam:    Answer:   Assistance in needle guidance and placement for procedures requiring needle placement in or near specific anatomical locations not easily accessible without such assistance.   Provider attestation of informed consent for procedure/surgical case    I, the ordering provider, attest that I have discussed with the patient the benefits, risks, side effects, alternatives, likelihood of achieving goals and potential problems during recovery for the procedure that I have provided informed consent.    Standing Status:   Standing    Number of Occurrences:   1   Informed Consent Details: Transcribe to consent form and obtain patient signature    Consent Attestation: I, the ordering provider, attest that I have discussed with the patient the benefits, risks, side-effects, alternatives, likelihood of achieving goals, and potential problems during  recovery for the procedure that I have provided informed consent.    Standing Status:   Standing    Number of Occurrences:   1    Order Specific Question:   Procedure    Answer:   Cervical facet medial branch RFA under fluoroscopic guidance. (See notes for level and laterality.)    Order Specific Question:   Surgeon    Answer:   Psalm Arman A. 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    Standing Status:   Standing    Number of Occurrences:   1    Order Specific Question:   Procedure    Answer:   Trigger point injection    Order Specific Question:   Surgeon    Answer:   Zuriyah Shatz A. Dossie Arbour, MD    Order Specific Question:   Indication/Reason    Answer:   Musculoskeletal pain/myofascial pain secondary to trigger point   Chronic Opioid Analgesic:  Hydrocodone/APAP 5/325 1 tablet p.o. twice daily MME/day:10mg /day.   Medications ordered for procedure: Meds ordered this encounter  Medications   lidocaine (XYLOCAINE) 2 % (with pres) injection 400 mg   lactated ringers infusion 1,000 mL   midazolam (VERSED) 5 MG/5ML injection 1-2 mg    Make sure Flumazenil is available in the pyxis when using this medication. If oversedation occurs, administer 0.2 mg IV over 15 sec. If after 45 sec no response, administer 0.2 mg again over 1 min; may repeat at  1 min intervals; not to exceed 4 doses (1 mg)   fentaNYL (SUBLIMAZE) injection 25-50 mcg    Make sure Narcan is available in the pyxis when using this medication. In the event of respiratory depression (RR<  8/min): Titrate NARCAN (naloxone) in increments of 0.1 to 0.2 mg IV at 2-3 minute intervals, until desired degree of reversal.   ropivacaine (PF) 2 mg/mL (0.2%) (NAROPIN) injection 9 mL   dexamethasone (DECADRON) injection 10 mg   oxyCODONE-acetaminophen (PERCOCET) 5-325 MG tablet    Sig: Take 1 tablet by mouth every 8 (eight) hours as needed for up to 7 days for severe pain. Must last 7 days.    Dispense:  21 tablet    Refill:  0    For acute post-operative pain. Not to be refilled.  Must last 7 days.   oxyCODONE-acetaminophen (PERCOCET) 5-325 MG tablet    Sig: Take 1 tablet by mouth every 8 (eight) hours as needed for up to 7 days for severe pain. Must last 7 days.    Dispense:  21 tablet    Refill:  0    For acute post-operative pain. Not to be refilled.  Must last 7 days.   ropivacaine (PF) 2 mg/mL (0.2%) (NAROPIN) injection 4 mL   methylPREDNISolone acetate (DEPO-MEDROL) injection 80 mg   Medications administered: We administered lidocaine, lactated ringers, midazolam, fentaNYL, ropivacaine (PF) 2 mg/mL (0.2%), dexamethasone, ropivacaine (PF) 2 mg/mL (0.2%), and methylPREDNISolone acetate.  See the medical record for exact dosing, route, and time of administration.  Follow-up plan:   Return in about 6 weeks (around 06/15/2019) for (VV), E/M (PP).      Considering:   Possible bilateral lumbar facet RFA #1 Possible bilateral sacroiliac joint RFA #1 Diagnostic bilateral intra-articular hip joint injection Diagnostic bilateral femoral nerve block + obturator nerve block Possible bilateral femoral nerve + obturator nerve RFA Diagnostic right-sided L1-2LESI Diagnostic right-sided L1-2TFESI Diagnostic bilateralL4-5TFESI Diagnostic right-sided CESI Diagnostic bilateral GONB Possible bilateral occipital nerve RFA Diagnostic bilateral intra-articular shoulder joint injection Diagnostic bilateral suprascapular nerve block Possible bilateral suprascapular nerve  RFA   Palliative PRN treatment(s):   Palliative bilateral cervical facet blocks (#5) Palliative bilateral cervical facet RFA #2  Palliative right-sided L2-3 interlaminar LESI #3+ right-sided L1 TFESI #2 Bilateral lumbar spinal cord stimulator trial (done)  Diagnostic bilateral lumbar facet block #3  Diagnostic bilateral sacroiliac joint block #3     Recent Visits Date Type Provider Dept  04/19/19 Office Visit Milinda Pointer, MD [facility]  04/01/19 Procedure visit Milinda Pointer, MD [facility]  03/24/19 Office Visit Milinda Pointer, MD [facility]  02/23/19 Office Visit Milinda Pointer, MD [facility]  02/09/19 Appointment Vevelyn Francois, NP [facility]  Showing recent visits within past 90 days and meeting all other requirements   Today's Visits Date Type Provider Dept  05/04/19 Procedure visit Milinda Pointer, MD [facility]  Showing today's visits and meeting all other requirements   Future Appointments Date Type Provider Dept  06/23/19 Appointment Milinda Pointer, MD [facility]  Showing future appointments within next 90 days and meeting all other requirements   Disposition: Discharge home  Discharge Date & Time: 05/04/2019; 1136 hrs.   Primary Care Physician: Pleas Koch, NP Location: [facility] Note by: Gaspar Cola, MD Date: 05/04/2019; Time: 12:01 PM  Disclaimer:  Medicine is not an Chief Strategy Officer. The only guarantee in medicine is that nothing is guaranteed. It is important to  note that the decision to proceed with this intervention was based on the information collected from the patient. The Data and conclusions were drawn from the patient's questionnaire, the interview, and the physical examination. Because the information was provided in large part by the patient, it cannot be guaranteed that it has not been purposely or  unconsciously manipulated. Every effort has been made to obtain as much relevant data as possible for this evaluation. It is important to note that the conclusions that lead to this procedure are derived in large part from the available data. Always take into account that the treatment will also be dependent on availability of resources and existing treatment guidelines, considered by other Pain Management Practitioners as being common knowledge and practice, at the time of the intervention. For Medico-Legal purposes, it is also important to point out that variation in procedural techniques and pharmacological choices are the acceptable norm. The indications, contraindications, technique, and results of the above procedure should only be interpreted and judged by a Board-Certified Interventional Pain Specialist with extensive familiarity and expertise in the same exact procedure and technique.

## 2019-05-04 NOTE — Progress Notes (Signed)
Safety precautions to be maintained throughout the outpatient stay will include: orient to surroundings, keep bed in low position, maintain call bell within reach at all times, provide assistance with transfer out of bed and ambulation.  

## 2019-05-04 NOTE — Patient Instructions (Signed)

## 2019-05-05 ENCOUNTER — Telehealth: Payer: Self-pay | Admitting: *Deleted

## 2019-05-05 NOTE — Telephone Encounter (Signed)
Spoke with [facility] and she denies any questions or concerns re; procedure on yesterday.

## 2019-06-10 ENCOUNTER — Other Ambulatory Visit: Payer: Self-pay | Admitting: Primary Care

## 2019-06-10 DIAGNOSIS — G4700 Insomnia, unspecified: Secondary | ICD-10-CM

## 2019-06-14 ENCOUNTER — Encounter: Payer: Self-pay | Admitting: Pain Medicine

## 2019-06-14 NOTE — Progress Notes (Signed)
Pain Management Virtual Encounter Note - Virtual Visit via Telephone Telehealth (real-time audio visits between healthcare provider and patient).   Patient's Phone No. & Preferred Pharmacy:  [phone] (home); [phone] (mobile); (Preferred) [phone] [email or web address]  [place], [place] [address] [place] [address] Phone: [phone] Fax: [phone]    Pre-screening note:  Our staff contacted Karen Edwards and offered her an "in person", "face-to-face" appointment versus a telephone encounter. She indicated preferring the telephone encounter, at this time.   Reason for Virtual Visit: COVID-19*  Social distancing based on CDC and AMA recommendations.   I contacted Karen Edwards on 06/15/2019 via telephone.      I clearly identified myself as Gaspar Cola, MD. I verified that I was speaking with the correct person using two identifiers (Name: Karen Edwards, and date of birth: 01-07-1966).  Advanced Informed Consent I sought verbal advanced consent from Karen Edwards for virtual visit interactions. I informed Karen Edwards of possible security and privacy concerns, risks, and limitations associated with providing "not-in-person" medical evaluation and management services. I also informed Karen Edwards of the availability of "in-person" appointments. Finally, I informed her that there would be a charge for the virtual visit and that she could be  personally, fully or partially, financially responsible for it. Karen Edwards expressed understanding and agreed to proceed.   Historic Elements   Karen [place] is a 53 y.o. year old, female patient evaluated today after her last encounter by our practice on 05/05/2019. Karen Edwards  has a past medical history of Anxiety, Chronic back pain, Degenerative joint disease, Depression, Dyspnea, Hypotension, IBS (irritable bowel syndrome) (05/2015), and MVP  (mitral valve prolapse). She also  has a past surgical history that includes Cesarean section; Ankle surgery (Right, 03/02/2014); Hemorrhoid surgery (N/A, 06/16/2015); Cervical spine surgery (02/21/2016); Cesarean section with bilateral tubal ligation (1990); Colonoscopy (04/19/2015); Hysteroscopy w/D&C (12/24/2013); Colonoscopy with propofol (N/A, 05/18/2018); Esophagogastroduodenoscopy (egd) with propofol (N/A, 05/18/2018); and Spinal cord stimulator insertion (N/A, 06/18/2018). Karen Edwards has a current medication list which includes the following prescription(s): cascara sagrada, b-12, flax seed oil, fluoxetine, hydrocodone-acetaminophen, hydrocodone-acetaminophen, hydrocodone-acetaminophen, methocarbamol, and trazodone. She  reports that she has been smoking cigarettes. She has a 18.50 pack-year smoking history. She has never used smokeless tobacco. She reports current alcohol use of about 1.0 standard drinks of alcohol per week. She reports that she does not use drugs. Karen Edwards is allergic to no known allergies.   HPI  Today, she is being contacted for both, medication management and a post-procedure assessment.  Post-Procedure Evaluation  Procedure: Therapeutic left-sided cervical facet radiofrequency ablation #1 + left-sided PSIS myoneural block, under fluoroscopic guidance and IV sedation Pre-procedure pain level:  4/10 Post-procedure: 0/10 (100% relief)  Sedation: Sedation provided.  Effectiveness during initial hour after procedure(Ultra-Short Term Relief): 100 %   Local anesthetic used: Long-acting (4-6 hours) Effectiveness: Defined as any analgesic benefit obtained secondary to the administration of local anesthetics. This carries significant diagnostic value as to the etiological location, or anatomical origin, of the pain. Duration of benefit is expected to coincide with the duration of the local anesthetic used.  Effectiveness during initial 4-6 hours after procedure(Short-Term  Relief): 100 %   Long-term benefit: Defined as any relief past the pharmacologic duration of the local anesthetics.  Effectiveness past the initial 6 hours after procedure(Long-Term Relief): 50 % (ongoing, but pain comes and goes.)   Current  benefits: Defined as benefit that persist at this time.   Analgesia:  >50% relief Function: Karen [place] reports improvement in function ROM: Karen [place] reports improvement in ROM  Pharmacotherapy Assessment  Analgesic: Hydrocodone/APAP 5/325 1 tablet p.o. twice daily MME/day:10mg /day.   Monitoring: Pharmacotherapy: No side-effects or adverse reactions reported. [place] PMP: PDMP reviewed during this encounter.       Compliance: No problems identified. Effectiveness: Clinically acceptable. Plan: Refer to "POC".  UDS:  Summary  Date Value Ref Range Status  04/06/2018 FINAL  Final    Comment:    ==================================================================== TOXASSURE SELECT 13 (MW) ==================================================================== Test                             Result       Flag       Units Drug Present and Declared for Prescription Verification   Norhydrocodone                 228          EXPECTED   ng/mg creat    Norhydrocodone is an expected metabolite of hydrocodone. Drug Absent but Declared for Prescription Verification   Hydrocodone                    Not Detected UNEXPECTED ng/mg creat    Hydrocodone is almost always present in patients taking this drug    consistently. Absence of hydrocodone could be due to lapse of    time since the last dose or unusual pharmacokinetics (rapid    metabolism). ==================================================================== Test                      Result    Flag   Units      Ref Range   Creatinine              60               mg/dL      >=20 ==================================================================== Declared Medications:  The flagging and interpretation  on this report are based on the  following declared medications.  Unexpected results may arise from  inaccuracies in the declared medications.  **Note: The testing scope of this panel includes these medications:  Hydrocodone (Norco)  **Note: The testing scope of this panel does not include following  reported medications:  Acetaminophen (Norco)  Atorvastatin (Lipitor)  Cyanocobalamin  Fluoxetine (Prozac)  Methocarbamol (Robaxin)  Potassium  Supplement  Trazodone (Desyrel) ==================================================================== For clinical consultation, please call [phone]. ====================================================================    Laboratory Chemistry Profile (12 mo)  Renal: No results found for requested labs within last 8760 hours.  Lab Results  Component Value Date   GFRAA 93 09/08/2017   GFRNONAA 81 09/08/2017   Hepatic: No results found for requested labs within last 8760 hours. Lab Results  Component Value Date   AST 18 09/08/2017   ALT 12 (L) 01/30/2017   Other: No results found for requested labs within last 8760 hours. Note: Above Lab results reviewed.  Imaging  Last 90 days:  Dg Pain Clinic C-arm 1-60 Min No Report  Result Date: 05/04/2019 Fluoro was used, but no Radiologist interpretation will be provided. Please refer to "NOTES" tab for provider progress note.  Dg Pain Clinic C-arm 1-60 Min No Report  Result Date: 04/26/2019 Fluoro was used, but no Radiologist interpretation will be provided. Please refer to "NOTES" tab for provider progress note.   Assessment  The primary encounter diagnosis was Chronic pain syndrome. Diagnoses of Chronic low back pain (Primary Area of Pain) (Bilateral) (R>L), Chronic lower extremity pain (Secondary Area of Pain) (Bilateral) (R>L), Chronic hip pain (Tertiary Area of Pain) (Bilateral) (R>L), Chronic neck pain (Fourth Area of Pain) (Bilateral) (R>L), Chronic occipital neuralgia (Fifth Area of  Pain) (Bilateral) (R>L), and Chronic musculoskeletal pain were also pertinent to this visit.  Plan of Care  I have discontinued Karen Edwards's HYDROcodone-acetaminophen and HYDROcodone-acetaminophen. I have also changed her HYDROcodone-acetaminophen. Additionally, I am having her start on HYDROcodone-acetaminophen and HYDROcodone-acetaminophen. Lastly, I am having her maintain her B-12, FLUoxetine, Cascara Sagrada, Flax Seed Oil, traZODone, and methocarbamol.  Pharmacotherapy (Medications Ordered): Meds ordered this encounter  Medications  . methocarbamol (ROBAXIN) 500 MG tablet    Sig: Take 2 tablets (1,000 mg total) by mouth at bedtime.    Dispense:  58 tablet    Refill:  0    Fill one day early if pharmacy is closed on scheduled refill date. May substitute for generic if available.  Marland Kitchen HYDROcodone-acetaminophen (NORCO/VICODIN) 5-325 MG tablet    Sig: Take 1-2 tablets by mouth at bedtime. Must last 30 days.    Dispense:  60 tablet    Refill:  0    Chronic Pain: STOP Act (Not applicable) Fill 1 day early if closed on refill date. Do not fill until: 06/22/2019. To last until: 07/22/2019. Avoid benzodiazepines within 8 hours of opioids  . HYDROcodone-acetaminophen (NORCO/VICODIN) 5-325 MG tablet    Sig: Take 1-2 tablets by mouth at bedtime. Must last 30 days.    Dispense:  60 tablet    Refill:  0    Chronic Pain: STOP Act (Not applicable) Fill 1 day early if closed on refill date. Do not fill until: 07/22/2019. To last until: 08/21/2019. Avoid benzodiazepines within 8 hours of opioids  . HYDROcodone-acetaminophen (NORCO/VICODIN) 5-325 MG tablet    Sig: Take 1-2 tablets by mouth at bedtime. Must last 30 days.    Dispense:  60 tablet    Refill:  0    Chronic Pain: STOP Act (Not applicable) Fill 1 day early if closed on refill date. Do not fill until: 08/21/2019. To last until: 09/20/2019. Avoid benzodiazepines within 8 hours of opioids   Orders:  No orders of the defined types were placed  in this encounter.  Follow-up plan:   Return in about 3 months (around 09/20/2019) for (VV), E/M (MM).      Considering:   Possible bilateral lumbar facet RFA #1 Possible bilateral sacroiliac joint RFA #1 Diagnostic bilateral intra-articular hip joint injection Diagnostic bilateral femoral nerve block + obturator nerve block Possible bilateral femoral nerve + obturator nerve RFA Diagnostic right-sided L1-2LESI Diagnostic right-sided L1-2TFESI Diagnostic bilateralL4-5TFESI Diagnostic right-sided CESI Diagnostic bilateral GONB Possible bilateral occipital nerve RFA Diagnostic bilateral intra-articular shoulder joint injection Diagnostic bilateral suprascapular nerve block Possible bilateral suprascapular nerve RFA   Palliative PRN treatment(s):   Palliative bilateral cervical facet blocks (#5) Palliative bilateral cervical facet RFA #2  Palliative right-sided L2-3 interlaminar LESI #3+ right-sided L1 TFESI #2 Bilateral lumbar spinal cord stimulator trial (done)  Diagnostic bilateral lumbar facet block #3  Diagnostic bilateral sacroiliac joint block #3      Recent Visits Date Type Provider Dept  05/04/19 Procedure visit Milinda Pointer, MD [facility]  04/19/19 Office Visit Milinda Pointer, MD [facility]  04/01/19 Procedure visit Milinda Pointer, MD [facility]  03/24/19 Office Visit Milinda Pointer, [facility]  Showing recent visits within past 90 days and meeting all other requirements   Today's Visits Date Type Provider Dept  06/15/19 Office Visit Milinda Pointer, MD [facility]  Showing today's visits and meeting all other requirements   Future Appointments No visits were found meeting these conditions.  Showing future appointments within next 90 days and meeting all other requirements   I discussed the assessment and treatment plan with the patient. The patient was provided an  opportunity to ask questions and all were answered. The patient agreed with the plan and demonstrated an understanding of the instructions.  Patient advised to call back or seek an in-person evaluation if the symptoms or condition worsens.  Total duration of non-face-to-face encounter: 12 minutes.  Note by: Gaspar Cola, MD Date: 06/15/2019; Time: 4:52 PM  Note: This dictation was prepared with Dragon dictation. Any transcriptional errors that may result from this process are unintentional.  Disclaimer:  * Given the special circumstances of the COVID-19 pandemic, the federal government has announced that the Office for Civil Rights (OCR) will exercise its enforcement discretion and will not impose penalties on physicians using telehealth in the event of noncompliance with regulatory requirements under the [place] (HIPAA) in connection with the good faith provision of telehealth during the SFKCL-27 national public health emergency. ([place])

## 2019-06-15 ENCOUNTER — Ambulatory Visit: Payer: PPO | Attending: Pain Medicine | Admitting: Pain Medicine

## 2019-06-15 ENCOUNTER — Other Ambulatory Visit: Payer: Self-pay

## 2019-06-15 DIAGNOSIS — M5441 Lumbago with sciatica, right side: Secondary | ICD-10-CM | POA: Diagnosis not present

## 2019-06-15 DIAGNOSIS — M7918 Myalgia, other site: Secondary | ICD-10-CM | POA: Diagnosis not present

## 2019-06-15 DIAGNOSIS — M79605 Pain in left leg: Secondary | ICD-10-CM

## 2019-06-15 DIAGNOSIS — G894 Chronic pain syndrome: Secondary | ICD-10-CM

## 2019-06-15 DIAGNOSIS — M5481 Occipital neuralgia: Secondary | ICD-10-CM

## 2019-06-15 DIAGNOSIS — M542 Cervicalgia: Secondary | ICD-10-CM | POA: Diagnosis not present

## 2019-06-15 DIAGNOSIS — G8929 Other chronic pain: Secondary | ICD-10-CM | POA: Diagnosis not present

## 2019-06-15 DIAGNOSIS — M5442 Lumbago with sciatica, left side: Secondary | ICD-10-CM | POA: Diagnosis not present

## 2019-06-15 DIAGNOSIS — M25551 Pain in right hip: Secondary | ICD-10-CM | POA: Diagnosis not present

## 2019-06-15 DIAGNOSIS — M25552 Pain in left hip: Secondary | ICD-10-CM

## 2019-06-15 DIAGNOSIS — M79604 Pain in right leg: Secondary | ICD-10-CM | POA: Diagnosis not present

## 2019-06-15 MED ORDER — HYDROCODONE-ACETAMINOPHEN 5-325 MG PO TABS: 1.0000 | ORAL_TABLET | Freq: Every day | ORAL | 0 refills | Status: DC

## 2019-06-15 MED ORDER — METHOCARBAMOL 500 MG PO TABS: 1000.0000 mg | ORAL_TABLET | Freq: Every day | ORAL | 0 refills | Status: DC

## 2019-06-21 ENCOUNTER — Ambulatory Visit: Payer: PPO | Admitting: Pain Medicine

## 2019-06-23 ENCOUNTER — Ambulatory Visit: Payer: PPO | Admitting: Pain Medicine

## 2019-07-09 ENCOUNTER — Other Ambulatory Visit: Payer: Self-pay | Admitting: Primary Care

## 2019-07-09 DIAGNOSIS — F329 Major depressive disorder, single episode, unspecified: Secondary | ICD-10-CM

## 2019-07-09 DIAGNOSIS — F32A Depression, unspecified: Secondary | ICD-10-CM

## 2019-07-21 IMAGING — CR DG LUMBAR SPINE COMPLETE W/ BEND
7 series · 7 of 7 positions shown · non-contrast
Comparison: Lumbar spine MRI of February 20, 2016

CLINICAL DATA: Chronic pain, pain clinic referral.

EXAM:
LUMBAR SPINE - COMPLETE WITH BENDING VIEWS

[l-spine ap]
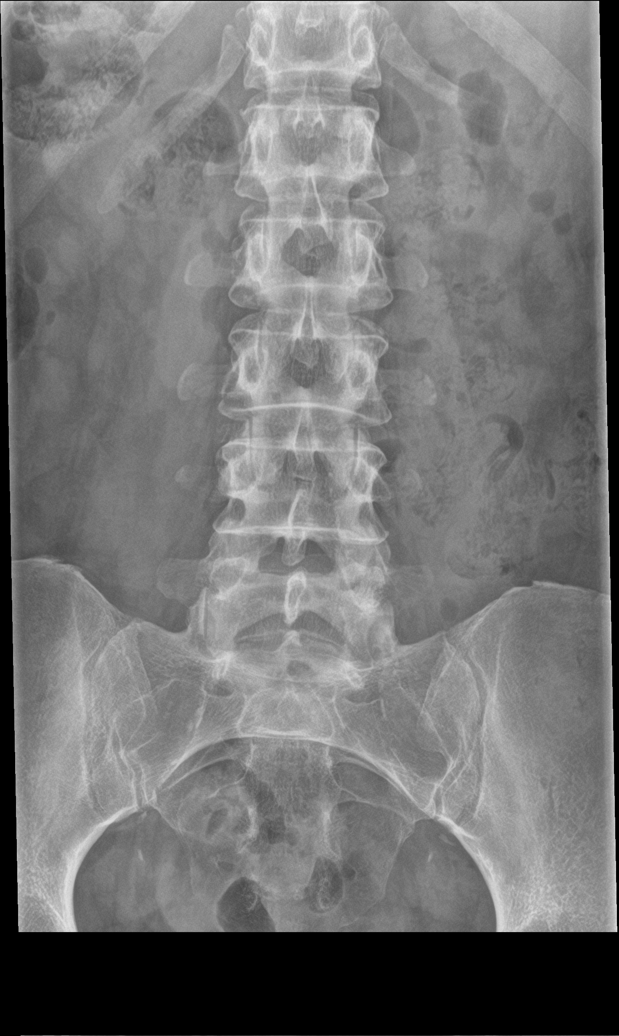

[l-spine obl (1 of 2)]
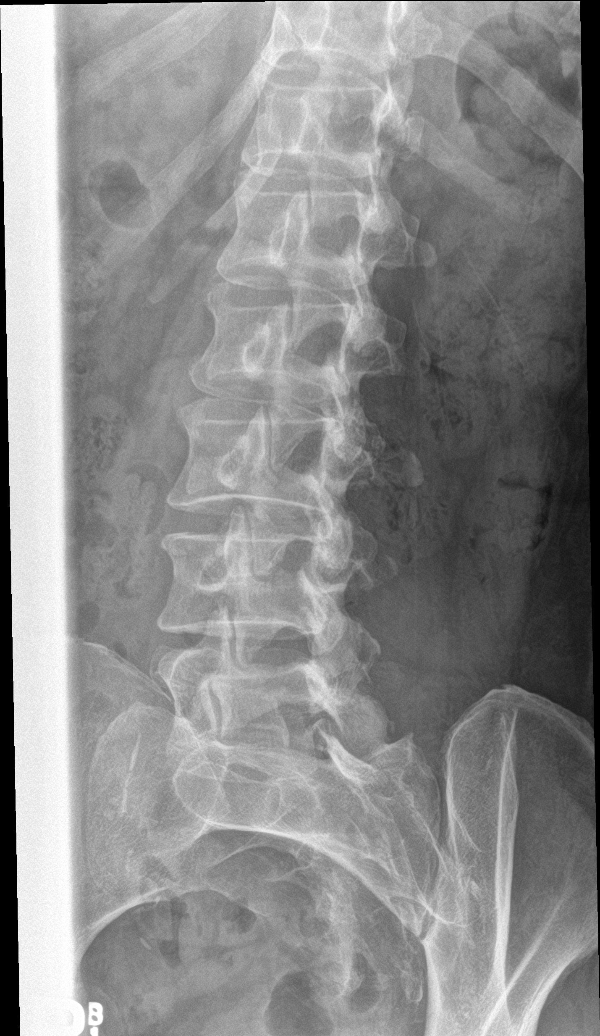

[l-spine obl (2 of 2)]
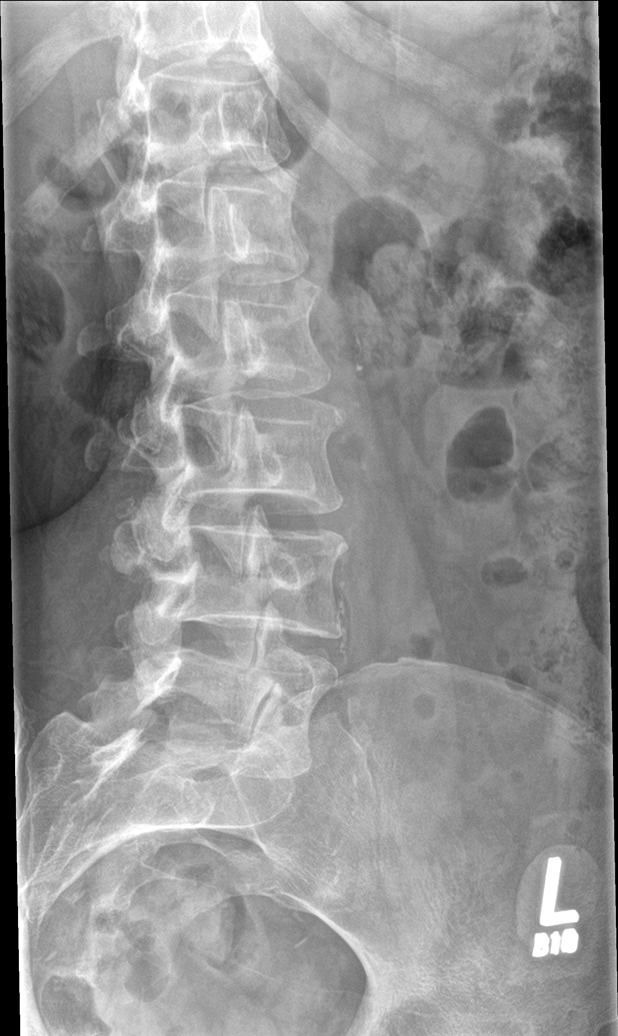

[l-spine lat]
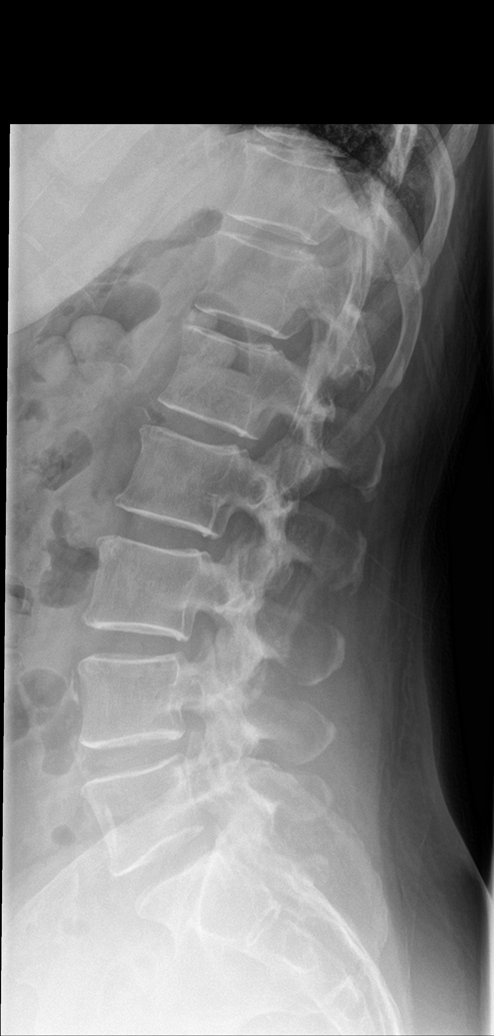

[l-spine flex]
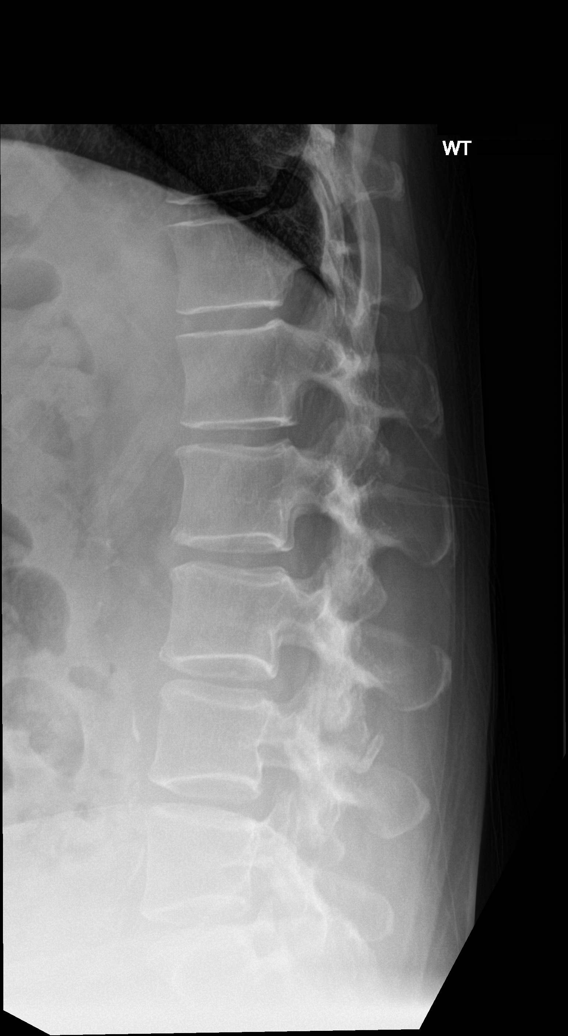

[l-spine ext]
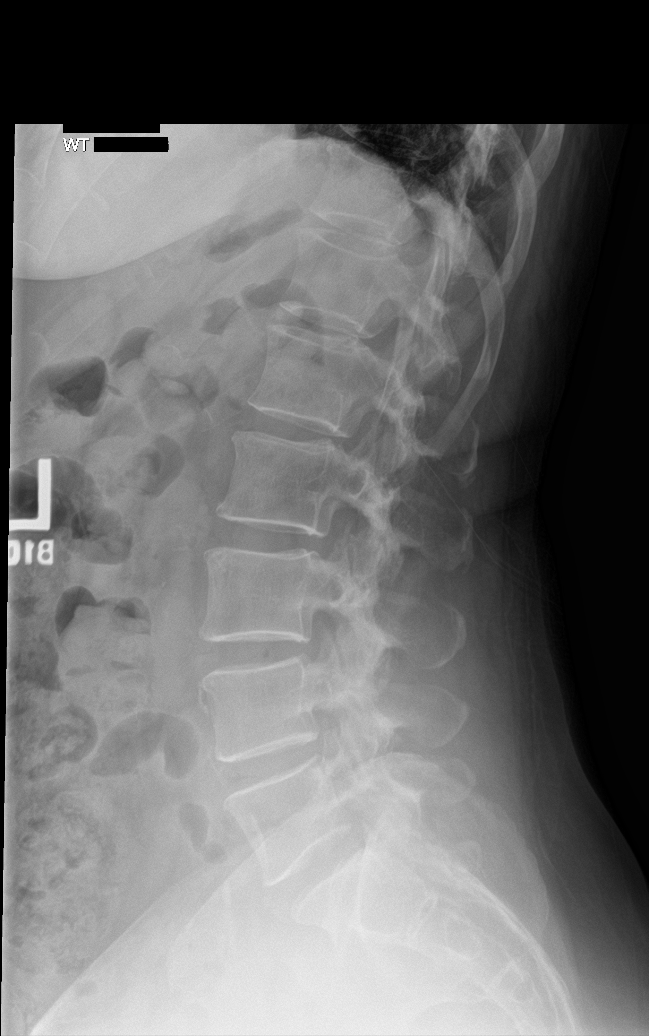

[l-spine spot]
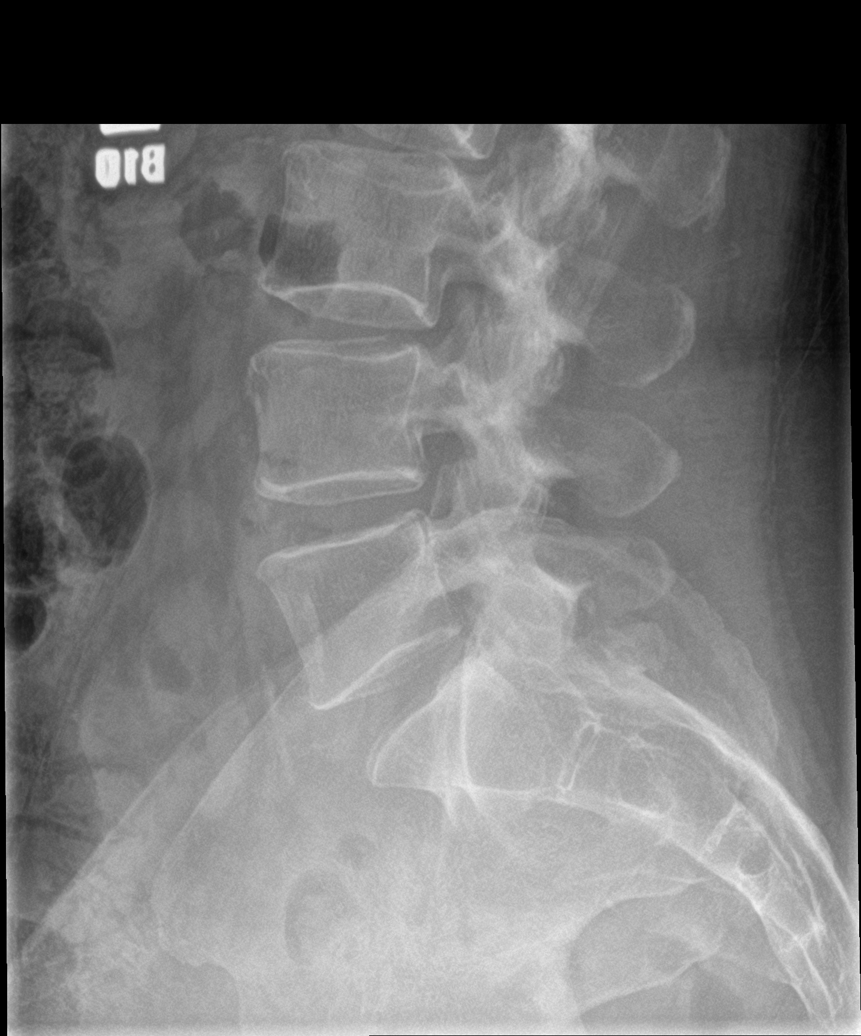

[7 of 7 positions shown; findings below may reference images not displayed]

FINDINGS: The lumbar vertebral bodies are preserved in height. The pedicles
and transverse processes are intact. The disc space heights are well
maintained. There is no spondylolisthesis. There is no significant
facet joint hypertrophy. There is no evidence of ligamentous
instability as the patient moves from the neutral to the flexed and
to the extended positions. The observed portions of the sacrum are
normal.
IMPRESSION: There is no acute or significant chronic bony abnormality of the
lumbar spine.

## 2019-07-21 IMAGING — CR DG HIP (WITH OR WITHOUT PELVIS) 2-3V*R*
3 series · 3 of 3 positions shown · non-contrast
Comparison: None in PACs

CLINICAL DATA: Chronic pelvic and hip pain. No report of injury.
Pain center referral.

EXAM:
DG HIP (WITH OR WITHOUT PELVIS) 2-3V RIGHT; DG HIP (WITH OR WITHOUT
PELVIS) 2-3V LEFT

[hip ap]
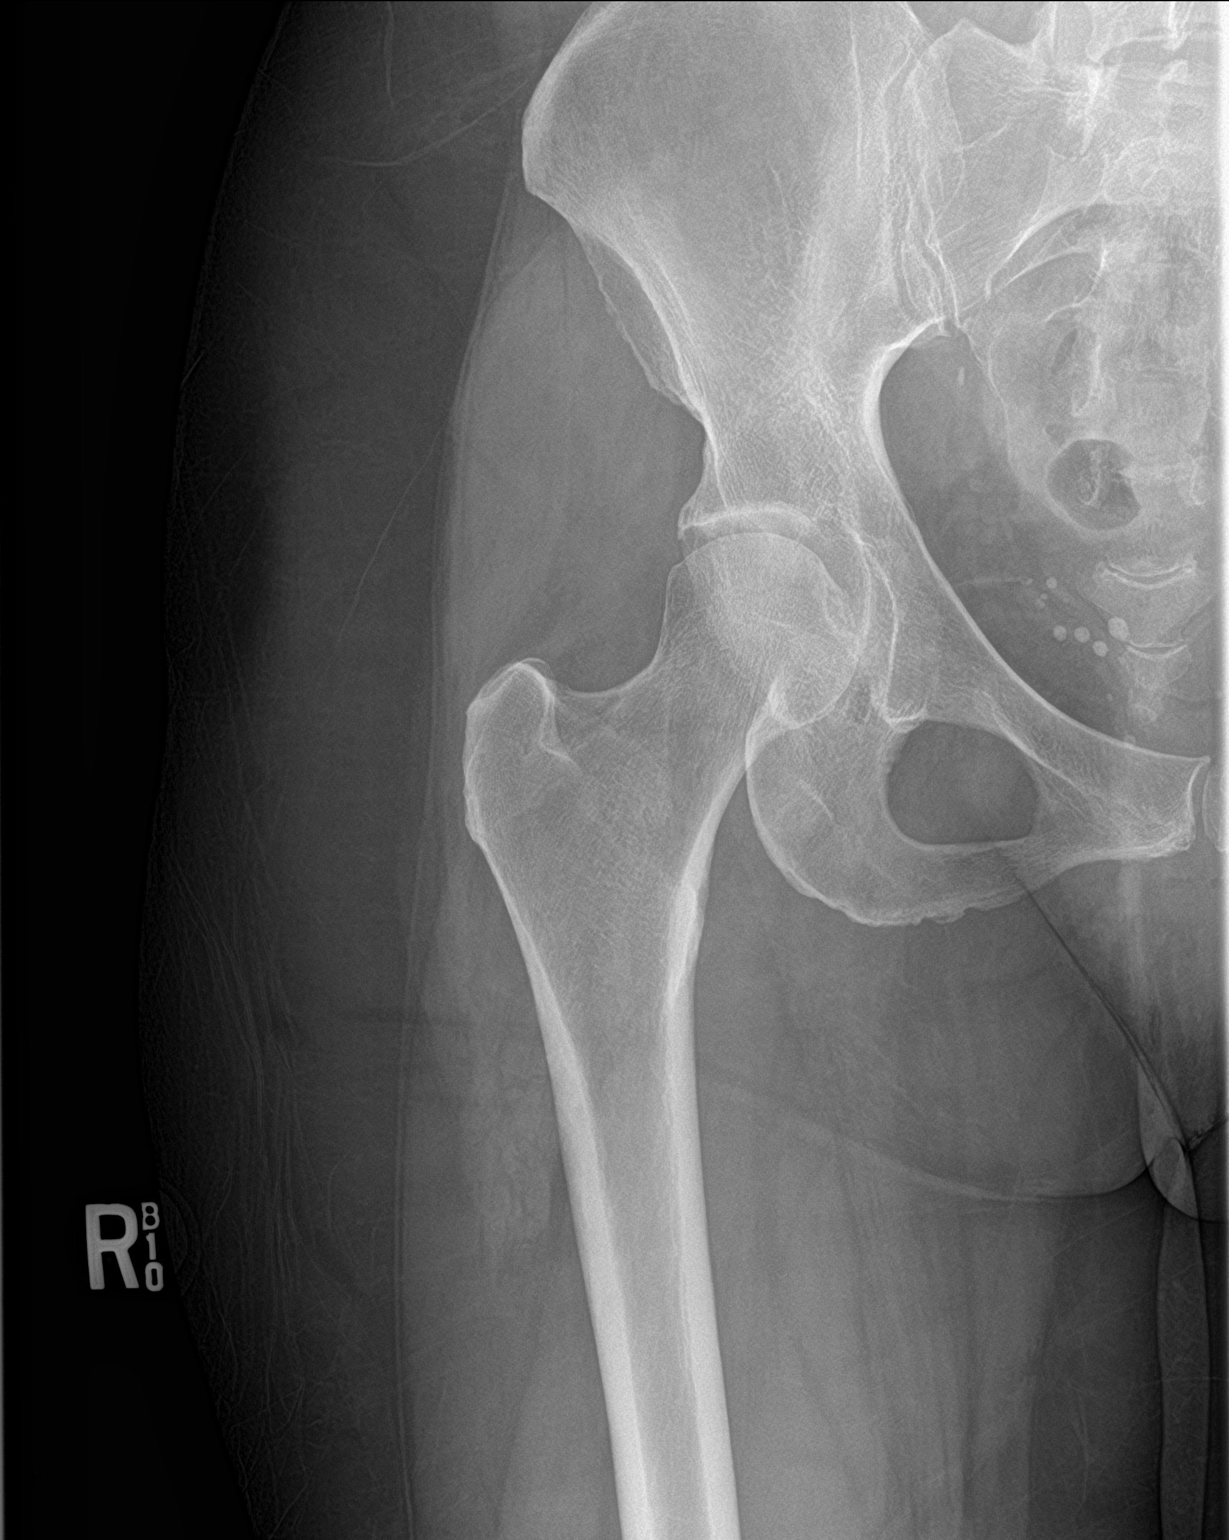

[pelvis ap]
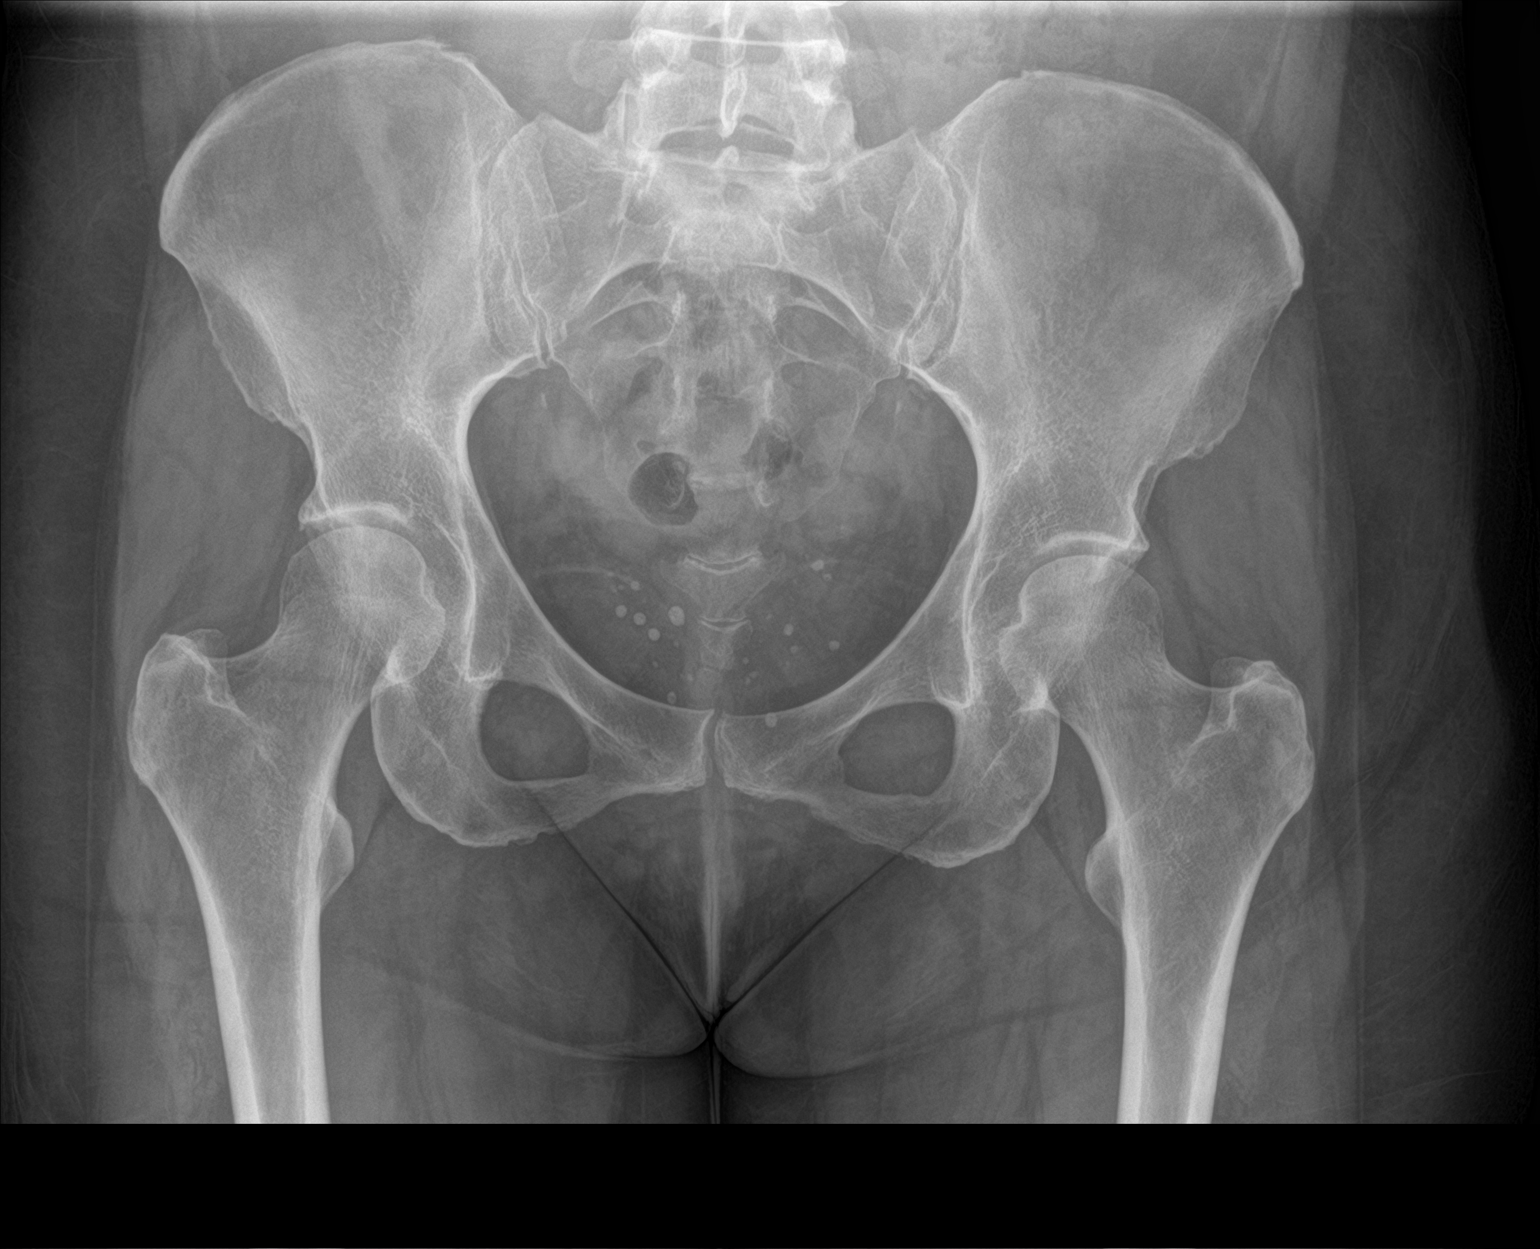

[hip lat]
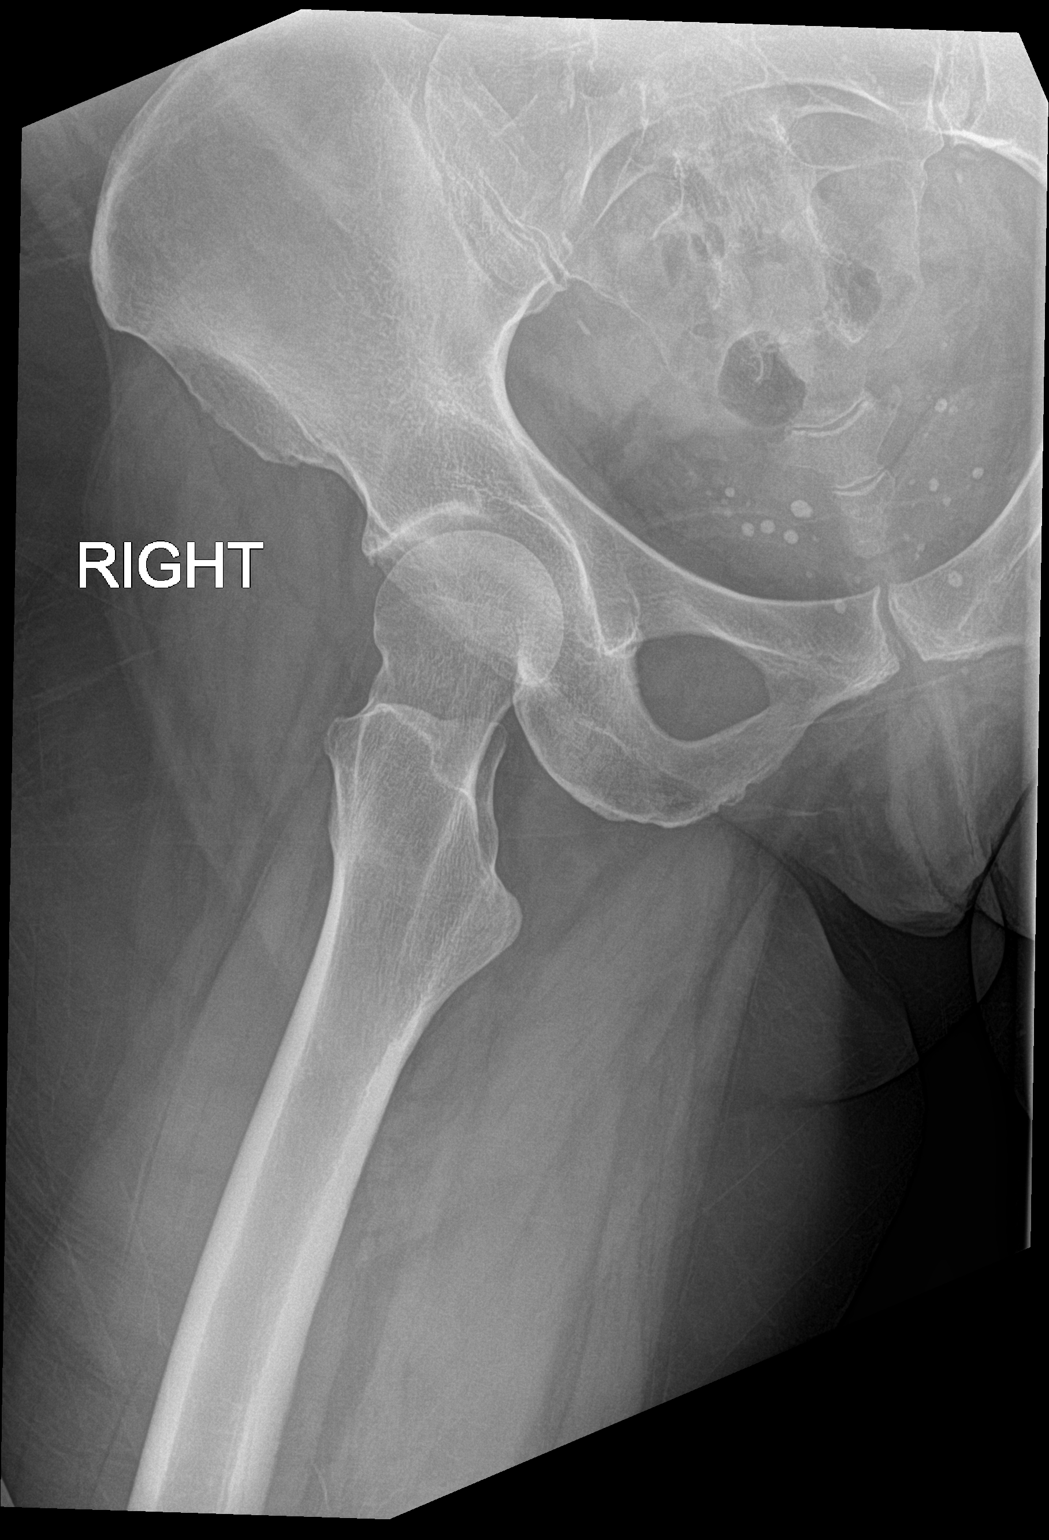

[3 of 3 positions shown; findings below may reference images not displayed]

FINDINGS: The bony pelvis is subjectively adequately mineralized. There is no
lytic or blastic lesion. The sacrum and SI joints appear normal.

AP and lateral views of both hips reveal preservation of the joint
spaces. The articular surfaces of the femoral heads and acetabuli
remains smoothly rounded. The femoral necks, intertrochanteric, and
subtrochanteric regions are normal. The overlying soft tissues are
normal.
IMPRESSION: There is no acute or significant chronic bony abnormality of the
pelvis or hips.

## 2019-07-26 ENCOUNTER — Ambulatory Visit: Payer: PPO | Admitting: Primary Care

## 2019-07-28 ENCOUNTER — Ambulatory Visit (INDEPENDENT_AMBULATORY_CARE_PROVIDER_SITE_OTHER): Payer: PPO | Admitting: Primary Care

## 2019-07-28 ENCOUNTER — Encounter: Payer: Self-pay | Admitting: Primary Care

## 2019-07-28 ENCOUNTER — Other Ambulatory Visit: Payer: Self-pay

## 2019-07-28 VITALS — Wt 147.0 lb

## 2019-07-28 DIAGNOSIS — G4700 Insomnia, unspecified: Secondary | ICD-10-CM

## 2019-07-28 MED ORDER — ZOLPIDEM TARTRATE 5 MG PO TABS: 5.0000 mg | ORAL_TABLET | Freq: Every evening | ORAL | 0 refills | Status: DC | PRN

## 2019-07-28 NOTE — Progress Notes (Signed)
Subjective:    Patient ID: Karen Edwards, female    DOB: Sep 29, 1966, 53 y.o.   MRN: [id number]  HPI  Virtual Visit via Video Note  I connected with Karen Edwards on 07/28/19 at 11:40 AM EDT by a video enabled telemedicine application and verified that I am speaking with the correct person using two identifiers.  Location: Patient: Home Provider: Office   I discussed the limitations of evaluation and management by telemedicine and the availability of in person appointments. The patient expressed understanding and agreed to proceed.  History of Present Illness:  Karen Edwards is a 53 year old female with a history of chronic back pain, major depressive disorder, insomnia, grief from husbands passing earlier this year who presents today with a chief complaint of anxiety and sleep disturbance.  She is currently managed on fluoxetine 60 mg daily and trazodone 300mg  for which she has taken for years. She will take her Trazodone nightly before bed, will feel tired so she will go to bed but will lay there waiting to fall asleep tossing and turning. She will also will wake 2-3 times during the night and will find herself drowsy the following day.  When she is able to sleep she doesn't want to get out of bed and will sleep until 12 pm at times. She also reports feeling nervous/anxious as she's dealing with family stress from her husbands passing in February 2020. Overall she feels as though her Prozac is helping, just not her Trazodone.  She was once managed on Ambien in the past and found that effective. She would like to try this again.    Observations/Objective:  Alert and oriented. Appears well, not sickly. No distress. Speaking in complete sentences.   Assessment and Plan:  Sleep disturbance since February 2020 after the unexpected passing of her husband. Trazodone no longer effective. Doing well on Prozac. Did well on Ambien in the past, agree to change. Stop Trazodone,  switch to Ambien. She will update in a few weeks.  Follow Up Instructions:  Stop Trazodone for sleep.  Start Ambien 5 mg once nightly as needed for sleep.  Continue taking fluoxetine as prescribed.  Please update me in a few weeks. It was a pleasure to see you today!    I discussed the assessment and treatment plan with the patient. The patient was provided an opportunity to ask questions and all were answered. The patient agreed with the plan and demonstrated an understanding of the instructions.   The patient was advised to call back or seek an in-person evaluation if the symptoms worsen or if the condition fails to improve as anticipated.    Karen Koch, NP    Review of Systems  Neurological: Negative for dizziness.  Psychiatric/Behavioral: Positive for sleep disturbance. The patient is nervous/anxious.        See HPI       Past Medical History:  Diagnosis Date  . Anxiety   . Chronic back pain   . Degenerative joint disease   . Depression   . Dyspnea   . Hypotension   . IBS (irritable bowel syndrome) 05/2015  . MVP (mitral valve prolapse)      Social History   Socioeconomic History  . Marital status: Married    Spouse name: Not on file  . Number of children: 2  . Years of education: Not on file  . Highest education level: Not on file  Occupational History  . Occupation: disabled  Social  Needs  . Financial resource strain: Not hard at all  . Food insecurity    Worry: Patient refused    Inability: Patient refused  . Transportation needs    Medical: Patient refused    Non-medical: Patient refused  Tobacco Use  . Smoking status: Current Every Day Smoker    Packs/day: 0.50    Years: 37.00    Pack years: 18.50    Types: Cigarettes  . Smokeless tobacco: Never Used  Substance and Sexual Activity  . Alcohol use: Yes    Alcohol/week: 1.0 standard drinks    Types: 1 Cans of beer per week    Comment: drinks an occasional beer  . Drug use: No  .  Sexual activity: Yes    Birth control/protection: None, Surgical    Comment: tubal ligation  Lifestyle  . Physical activity    Days per week: Patient refused    Minutes per session: Patient refused  . Stress: Not at all  Relationships  . Social Herbalist on phone: Patient refused    Gets together: Patient refused    Attends religious service: Patient refused    Active member of club or organization: Patient refused    Attends meetings of clubs or organizations: Patient refused    Relationship status: Patient refused  . Intimate partner violence    Fear of current or ex partner: Patient refused    Emotionally abused: Patient refused    Physically abused: Patient refused    Forced sexual activity: Patient refused  Other Topics Concern  . Not on file  Social History Narrative   Married.   2 children. One boy and one girl.   On disability.   Playing with her dogs, gardening.    Past Surgical History:  Procedure Laterality Date  . ANKLE SURGERY Right 03/02/2014   repair of tendon and sural nerve right ankle  . CERVICAL SPINE SURGERY  02/21/2016   arthrodesis of anterior cervical spine (fusion of C5-6,C6-7 and insertion of bone allograft  . CESAREAN SECTION    . CESAREAN SECTION WITH BILATERAL TUBAL LIGATION  1990  . COLONOSCOPY  04/19/2015  . COLONOSCOPY WITH PROPOFOL N/A 05/18/2018   Procedure: COLONOSCOPY WITH PROPOFOL;  Surgeon: Lin Landsman, MD;  Location: [facility] END[facility]COPY;  Service: Gastroenterology;  Laterality: N/A;  . ESOPHAGOGASTRODUODENOSCOPY (EGD) WITH PROPOFOL N/A 05/18/2018   Procedure: ESOPHAGOGASTRODUODENOSCOPY (EGD) WITH PROPOFOL;  Surgeon: Lin Landsman, MD;  Location: [facility] ENDOSCOPY;  Service: Gastroenterology;  Laterality: N/A;  . HEMORRHOID SURGERY N/A 06/16/2015   Procedure: HEMORRHOIDECTOMY;  Surgeon: Leonie Green, MD;  Location: [facility] ORS;  Service: General;  Laterality: N/A;  . HYSTEROSCOPY W/D&C  12/24/2013  . SPINAL CORD  STIMULATOR INSERTION N/A 06/18/2018   Procedure: LUMBAR SPINAL CORD STIMULATOR INSERTION;  Surgeon: Milinda Pointer, MD;  Location: [facility] ORS;  Service: Neurosurgery;  Laterality: N/A;    Family History  Problem Relation Age of Onset  . Arthritis Mother   . Depression Mother   . Hyperlipidemia Mother   . Hypertension Mother   . Kidney disease Mother   . Vision loss Mother   . Hypothyroidism Mother   . Hearing loss Father   . Hyperlipidemia Father   . Hypertension Father   . Diabetes Mellitus I Maternal Grandmother   . Stroke Maternal Grandmother   . Congestive Heart Failure Maternal Grandfather   . Heart attack Maternal Grandfather   . Prostate cancer Paternal Grandfather   . Thyroid disease Maternal Aunt   .  Diabetes Maternal Aunt   . Gout Maternal Aunt     Allergies  Allergen Reactions  . No Known Allergies     Current Outpatient Medications on File Prior to Visit  Medication Sig Dispense Refill  . C[place] 450 MG CAPS Take 450 mg by mouth daily.    . Cyanocobalamin (B-12) 500 MCG TABS Take 1,000 mcg by mouth daily.    . Flaxseed, Linseed, (FLAX SEED OIL) 1000 MG CAPS Take 1,000 mg by mouth daily.    Marland Kitchen FLUoxetine (PROZAC) 20 MG capsule Take 3 capsules (60 mg total) by mouth daily. NEED APPOINTMENT FOR ANY MORE 90 capsule 0  . HYDROcodone-acetaminophen (NORCO/VICODIN) 5-325 MG tablet Take 1-2 tablets by mouth at bedtime. Must last 30 days. 60 tablet 0  . [START ON 08/21/2019] HYDROcodone-acetaminophen (NORCO/VICODIN) 5-325 MG tablet Take 1-2 tablets by mouth at bedtime. Must last 30 days. 60 tablet 0  . [START ON 08/22/2019] methocarbamol (ROBAXIN) 500 MG tablet Take 2 tablets (1,000 mg total) by mouth at bedtime. 58 tablet 0  . HYDROcodone-acetaminophen (NORCO/VICODIN) 5-325 MG tablet Take 1-2 tablets by mouth at bedtime. Must last 30 days. 60 tablet 0   No current facility-administered medications on file prior to visit.     Wt 147 lb (66.7 kg)   LMP  05/05/2017 (Exact Date)   BMI 28.71 kg/m    Objective:   Physical Exam  Constitutional: She is oriented to person, place, and time. She appears well-nourished.  Respiratory: Effort normal.  Neurological: She is alert and oriented to person, place, and time.  Psychiatric: She has a normal mood and affect.           Assessment & Plan:

## 2019-07-28 NOTE — Assessment & Plan Note (Signed)
Sleep disturbance since February 2020 after the unexpected passing of her husband. Trazodone no longer effective. Doing well on Prozac. Did well on Ambien in the past, agree to change. Stop Trazodone, switch to Ambien. She will update in a few weeks.

## 2019-07-28 NOTE — Patient Instructions (Signed)
Stop Trazodone for sleep.  Start Ambien 5 mg once nightly as needed for sleep.  Continue taking fluoxetine as prescribed.  Please update me in a few weeks. It was a pleasure to see you today!

## 2019-08-11 ENCOUNTER — Telehealth: Payer: Self-pay | Admitting: *Deleted

## 2019-08-15 ENCOUNTER — Other Ambulatory Visit: Payer: Self-pay | Admitting: Primary Care

## 2019-08-15 DIAGNOSIS — F329 Major depressive disorder, single episode, unspecified: Secondary | ICD-10-CM

## 2019-08-15 DIAGNOSIS — F32A Depression, unspecified: Secondary | ICD-10-CM

## 2019-08-16 ENCOUNTER — Other Ambulatory Visit: Payer: Self-pay | Admitting: Pain Medicine

## 2019-08-16 ENCOUNTER — Encounter: Payer: Self-pay | Admitting: Pain Medicine

## 2019-08-16 ENCOUNTER — Telehealth: Payer: Self-pay

## 2019-08-16 DIAGNOSIS — M5481 Occipital neuralgia: Secondary | ICD-10-CM

## 2019-08-16 NOTE — Telephone Encounter (Signed)
Dr Dossie Arbour put in an order for a Greater occipital nerve block with sedation.  Please schedule with patient.

## 2019-08-17 NOTE — Telephone Encounter (Signed)
Patient returned call and is scheduled Thurs Oct 22 at 10:15

## 2019-08-17 NOTE — Telephone Encounter (Signed)
Lvmail asking patient to call for appt.

## 2019-08-19 ENCOUNTER — Ambulatory Visit (HOSPITAL_BASED_OUTPATIENT_CLINIC_OR_DEPARTMENT_OTHER): Payer: PPO | Admitting: Pain Medicine

## 2019-08-19 ENCOUNTER — Other Ambulatory Visit: Payer: Self-pay

## 2019-08-19 ENCOUNTER — Encounter: Payer: Self-pay | Admitting: Pain Medicine

## 2019-08-19 ENCOUNTER — Ambulatory Visit
Admission: RE | Admit: 2019-08-19 | Discharge: 2019-08-19 | Disposition: A | Discharge status: Home / Self Care | Payer: PPO | Source: Ambulatory Visit | Attending: Pain Medicine | Admitting: Pain Medicine

## 2019-08-19 VITALS — BP 121/62 | HR 68 | Temp 98.2°F | Resp 14 | Ht 61.0 in | Wt 140.0 lb

## 2019-08-19 DIAGNOSIS — G44221 Chronic tension-type headache, intractable: Secondary | ICD-10-CM | POA: Insufficient documentation

## 2019-08-19 DIAGNOSIS — M5481 Occipital neuralgia: Secondary | ICD-10-CM | POA: Insufficient documentation

## 2019-08-19 DIAGNOSIS — G4486 Cervicogenic headache: Secondary | ICD-10-CM

## 2019-08-19 DIAGNOSIS — M542 Cervicalgia: Secondary | ICD-10-CM

## 2019-08-19 DIAGNOSIS — M7918 Myalgia, other site: Secondary | ICD-10-CM | POA: Diagnosis not present

## 2019-08-19 DIAGNOSIS — T85848S Pain due to other internal prosthetic devices, implants and grafts, sequela: Secondary | ICD-10-CM

## 2019-08-19 DIAGNOSIS — G8929 Other chronic pain: Secondary | ICD-10-CM | POA: Diagnosis not present

## 2019-08-19 DIAGNOSIS — R519 Headache, unspecified: Secondary | ICD-10-CM

## 2019-08-19 MED ORDER — LIDOCAINE HCL 2 % IJ SOLN
20.0000 mL | Freq: Once | INTRAMUSCULAR | Status: AC
  Administered 2019-08-19: 11:00:00 400 mg
  Filled 2019-08-19: qty 40

## 2019-08-19 MED ORDER — ROPIVACAINE HCL 2 MG/ML IJ SOLN
8.0000 mL | Freq: Once | INTRAMUSCULAR | Status: AC
  Administered 2019-08-19: 11:00:00 8 mL
  Filled 2019-08-19: qty 10

## 2019-08-19 MED ORDER — MIDAZOLAM HCL 5 MG/5ML IJ SOLN
1.0000 mg | INTRAMUSCULAR | Status: DC | PRN
  Administered 2019-08-19: 3 mg via INTRAVENOUS
  Filled 2019-08-19: qty 5

## 2019-08-19 MED ORDER — LACTATED RINGERS IV SOLN
1000.0000 mL | Freq: Once | INTRAVENOUS | Status: AC
  Administered 2019-08-19: 11:00:00 1000 mL via INTRAVENOUS

## 2019-08-19 MED ORDER — FENTANYL CITRATE (PF) 100 MCG/2ML IJ SOLN
25.0000 ug | INTRAMUSCULAR | Status: DC | PRN
  Administered 2019-08-19: 11:00:00 100 ug via INTRAVENOUS
  Filled 2019-08-19: qty 2

## 2019-08-19 MED ORDER — METHYLPREDNISOLONE ACETATE 80 MG/ML IJ SUSP
80.0000 mg | Freq: Once | INTRAMUSCULAR | Status: AC
  Administered 2019-08-19: 80 mg via INTRAMUSCULAR
  Filled 2019-08-19: qty 1

## 2019-08-19 MED ORDER — METHOCARBAMOL 500 MG PO TABS: 1000.0000 mg | ORAL_TABLET | Freq: Every day | ORAL | 5 refills | Status: DC

## 2019-08-19 MED ORDER — DEXAMETHASONE SODIUM PHOSPHATE 10 MG/ML IJ SOLN
10.0000 mg | Freq: Once | INTRAMUSCULAR | Status: AC
  Administered 2019-08-19: 11:00:00 10 mg
  Filled 2019-08-19: qty 1

## 2019-08-19 NOTE — Progress Notes (Signed)
Safety precautions to be maintained throughout the outpatient stay will include: orient to surroundings, keep bed in low position, maintain call bell within reach at all times, provide assistance with transfer out of bed and ambulation.  

## 2019-08-19 NOTE — Progress Notes (Signed)
Patient's Name: Karen Edwards  MRN: [id number]  Referring Provider: Pleas Koch, NP  DOB: 08-17-1966  PCP: Pleas Koch, NP  DOS: 08/19/2019  Note by: Gaspar Cola, MD  Service setting: Ambulatory outpatient  Specialty: Interventional Pain Management  Patient type: Established  Location: [facility] (AMB) Pain Management Facility  Visit type: Interventional Procedure   Primary Reason for Visit: Interventional Pain Management Treatment. CC: Neck Pain  Procedure:          Anesthesia, Analgesia, Anxiolysis:  Type: Diagnostic, Greater, Occipital Nerve Block  #1  Region: Posterolateral Cervical Level: Occipital Ridge   Laterality: Bilateral  Type: Moderate (Conscious) Sedation combined with Local Anesthesia Indication(s): Analgesia and Anxiety Route: Intravenous (IV) IV Access: Secured Sedation: Meaningful verbal contact was maintained at all times during the procedure  Local Anesthetic: Lidocaine 1-2%  Position: Prone   Indications: 1. Chronic occipital neuralgia (Fifth Area of Pain) (Bilateral) (R>L)   2. Cervicogenic headache (Bilateral)   3. Occipital headache (Bilateral)   4. Chronic tension-type headache, intractable   5. Cervico-occipital neuralgia (Bilateral)   6. Cervicalgia    Pain Score: Pre-procedure: 4 /10 Post-procedure: 0-No pain/10   Note: As of today, the PMP reveals that the patient has not filled one of the oxycodone prescriptions written on 05/04/2019, and none of the 3 hydrocodone prescriptions (3 of 3) written on  06/15/2019. If she was to start filling them today, she should have enough medication to last x 14 weeks ( good on meds until at least Thursday, November 25, 2019).  As a separate note, she indicates still having pain over her spinal cord stimulator battery.  Today I inspected the area and there is no evidence of redness, swelling, or anything that would suggest that it is irritating the skin or that there is any type of infection.  However,  she is still having discomfort right over the scar.  There is not a whole lot of padding below that scar.  Previously we had injected some local anesthetic and steroid which did help for a short period of time, but the it has continued to hurt.  Today we talked about several options including revising the battery and implanting it deeper.  We have decided to give it some time until the beginning of the year at which time, we will consider going back in and doing a revision.  Pre-op Assessment:  Karen Edwards is a 53 y.o. (year old), female patient, seen today for interventional treatment. She  has a past surgical history that includes Cesarean section; Ankle surgery (Right, 03/02/2014); Hemorrhoid surgery (N/A, 06/16/2015); Cervical spine surgery (02/21/2016); Cesarean section with bilateral tubal ligation (1990); Colonoscopy (04/19/2015); Hysteroscopy w/D&C (12/24/2013); Colonoscopy with propofol (N/A, 05/18/2018); Esophagogastroduodenoscopy (egd) with propofol (N/A, 05/18/2018); and Spinal cord stimulator insertion (N/A, 06/18/2018). Karen Edwards has a current medication list which includes the following prescription(s): casc[place] sagrada, b-12, flax seed oil, fluoxetine, hydrocodone-acetaminophen, hydrocodone-acetaminophen, methocarbamol, methocarbamol, zolpidem, and hydrocodone-acetaminophen, and the following Facility-Administered Medications: fentanyl and midazolam. Her primarily concern today is the Neck Pain  Initial Vital Signs:  Pulse/HCG Rate: 68ECG Heart Rate: 70 Temp: 98.6 F (37 C) Resp: 18 BP: 114/68 SpO2: 99 %  BMI: Estimated body mass index is 26.45 kg/m as calculated from the following:   Height as of this encounter: 5\' 1"  (1.549 m).   Weight as of this encounter: 140 lb (63.5 kg).  Risk Assessment: Allergies: Reviewed. She is allergic to no known allergies.  Allergy Precautions: None required Coagulopathies: Reviewed. None  identified.  Blood-thinner therapy: None at this  time Active Infection(s): Reviewed. None identified. Karen Edwards is afebrile  Site Confirmation: Karen Edwards was asked to confirm the procedure and laterality before marking the site Procedure checklist: Completed Consent: Before the procedure and under the influence of no sedative(s), amnesic(s), or anxiolytics, the patient was informed of the treatment options, risks and possible complications. To fulfill our ethical and legal obligations, as recommended by the American Medical Association's Code of Ethics, I have informed the patient of my clinical impression; the nature and purpose of the treatment or procedure; the risks, benefits, and possible complications of the intervention; the alternatives, including doing nothing; the risk(s) and benefit(s) of the alternative treatment(s) or procedure(s); and the risk(s) and benefit(s) of doing nothing. The patient was provided information about the general risks and possible complications associated with the procedure. These may include, but are not limited to: failure to achieve desired goals, infection, bleeding, organ or nerve damage, allergic reactions, paralysis, and death. In addition, the patient was informed of those risks and complications associated to the procedure, such as failure to decrease pain; infection; bleeding; organ or nerve damage with subsequent damage to sensory, motor, and/or autonomic systems, resulting in permanent pain, numbness, and/or weakness of one or several areas of the body; allergic reactions; (i.e.: anaphylactic reaction); and/or death. Furthermore, the patient was informed of those risks and complications associated with the medications. These include, but are not limited to: allergic reactions (i.e.: anaphylactic or anaphylactoid reaction(s)); adrenal axis suppression; blood sugar elevation that in diabetics may result in ketoacidosis or comma; water retention that in patients with history of congestive heart failure may  result in shortness of breath, pulmonary edema, and decompensation with resultant heart failure; weight gain; swelling or edema; medication-induced neural toxicity; particulate matter embolism and blood vessel occlusion with resultant organ, and/or nervous system infarction; and/or aseptic necrosis of one or more joints. Finally, the patient was informed that Medicine is not an exact science; therefore, there is also the possibility of unforeseen or unpredictable risks and/or possible complications that may result in a catastrophic outcome. The patient indicated having understood very clearly. We have given the patient no guarantees and we have made no promises. Enough time was given to the patient to ask questions, all of which were answered to the patient's satisfaction. Ms. Daulton has indicated that she wanted to continue with the procedure. Attestation: I, the ordering provider, attest that I have discussed with the patient the benefits, risks, side-effects, alternatives, likelihood of achieving goals, and potential problems during recovery for the procedure that I have provided informed consent. Date   Time: 08/19/2019 10:31 AM  Pre-Procedure Preparation:  Monitoring: As per clinic protocol. Respiration, ETCO2, SpO2, BP, heart rate and rhythm monitor placed and checked for adequate function Safety Precautions: Patient was assessed for positional comfort and pressure points before starting the procedure. Time-out: I initiated and conducted the "Time-out" before starting the procedure, as per protocol. The patient was asked to participate by confirming the accuracy of the "Time Out" information. Verification of the correct person, site, and procedure were performed and confirmed by me, the nursing staff, and the patient. "Time-out" conducted as per Joint Commission's Universal Protocol (UP.01.01.01). Time: 1054  Description of Procedure:          Target Area: Area medial to the occipital artery at  the level of the superior nuchal ridge Approach: Posterior approach Area Prepped: Entire Posterior Occipital Region Prepping solution: DuraPrep (Iodine Povacrylex [0.7% available  iodine] and Isopropyl Alcohol, 74% w/w) Safety Precautions: Aspiration looking for blood return was conducted prior to all injections. At no point did we inject any substances, as a needle was being advanced. No attempts were made at seeking any paresthesias. Safe injection practices and needle disposal techniques used. Medications properly checked for expiration dates. SDV (single dose vial) medications used. Description of the Procedure: Protocol guidelines were followed. The target area was identified and the area prepped in the usual manner. Skin & deeper tissues infiltrated with local anesthetic. Appropriate amount of time allowed to pass for local anesthetics to take effect. The procedure needles were then advanced to the target area. Proper needle placement secured. Negative aspiration confirmed. Solution injected in intermittent fashion, asking for systemic symptoms every 0.5cc of injectate. The needles were then removed and the area cleansed, making sure to leave some of the prepping solution back to take advantage of its long term bactericidal properties.  Vitals:   08/19/19 1101 08/19/19 1111 08/19/19 1120 08/19/19 1130  BP: 119/69 123/64 120/65 121/62  Pulse:      Resp: 12 14 15 14   Temp:  97.8 F (36.6 C)  98.2 F (36.8 C)  TempSrc:      SpO2: 94% 97% 98% 98%  Weight:      Height:        Start Time: 1054 hrs. End Time: 1101 hrs. Materials:  Needle(s) Type: Spinal Needle Gauge: 22G Length: 3.5-in Medication(s): Please see orders for medications and dosing details.  Imaging Guidance (Non-Spinal):          Type of Imaging Technique: Fluoroscopy Guidance (Non-Spinal) Indication(s): Assistance in needle guidance and placement for procedures requiring needle placement in or near specific anatomical  locations not easily accessible without such assistance. Exposure Time: Please see nurses notes. Contrast: None used. Fluoroscopic Guidance: I was personally present during the use of fluoroscopy. "Tunnel Vision Technique" used to obtain the best possible view of the target area. Parallax error corrected before commencing the procedure. "Direction-depth-direction" technique used to introduce the needle under continuous pulsed fluoroscopy. Once target was reached, antero-posterior, oblique, and lateral fluoroscopic projection used confirm needle placement in all planes. Images permanently stored in EMR. Interpretation: No contrast injected. I personally interpreted the imaging intraoperatively. Adequate needle placement confirmed in multiple planes. Permanent images saved into the patient's record.  Antibiotic Prophylaxis:   Anti-infectives (From admission, onward)   None     Indication(s): None identified  Post-operative Assessment:  Post-procedure Vital Signs:  Pulse/HCG Rate: 6865 Temp: 98.2 F (36.8 C) Resp: 14 BP: 121/62 SpO2: 98 %  EBL: None  Complications: No immediate post-treatment complications observed by team, or reported by patient.  Note: The patient tolerated the entire procedure well. A repeat set of vitals were taken after the procedure and the patient was kept under observation following institutional policy, for this type of procedure. Post-procedural neurological assessment was performed, showing return to baseline, prior to discharge. The patient was provided with post-procedure discharge instructions, including a section on how to identify potential problems. Should any problems arise concerning this procedure, the patient was given instructions to immediately contact us, at any time, without hesitation. In any case, we plan to contact the patient by telephone for a follow-up status report regarding this interventional procedure.  Comments:  No additional relevant  information.  Plan of Care  Orders:  Orders Placed This Encounter  Procedures   GREATER OCCIPITAL NERVE BLOCK    Scheduling Instructions:     Procedure: Occipital  nerve block     Laterality: Bilateral     Sedation: Patient's choice.     Timeframe: Today    Order Specific Question:   Where will this procedure be performed?    Answer:   [facility]   DG [facility] C-ARM 1-60 MIN NO REPORT    Intraoperative interpretation by procedural physician at [place].    Standing Status:   Standing    Number of Occurrences:   1    Order Specific Question:   Reason for exam:    Answer:   Assistance in needle guidance and placement for procedures requiring needle placement in or near specific anatomical locations not easily accessible without such assistance.   Informed Consent Details: Physician/Practitioner Attestation; Transcribe to consent form and obtain patient signature    Nursing Order: Transcribe to consent form and obtain patient signature. Note: Always confirm laterality of pain with Ms. Tye Savoy, before procedure. Procedure: Occipital Nerve Block Indication/Reason: Occipital Headache (Cervicogenic Headache) secondary to Occipital Neuralgia/Neuritis and/or upper cervical radiculitis. Provider Attestation: I, [place] Dossie Arbour, MD, (Pain Management Specialist), the physician/practitioner, attest that I have discussed with the patient the benefits, risks, side effects, alternatives, likelihood of achieving goals and potential problems during recovery for the procedure that I have provided informed consent.   Provide equipment / supplies at bedside    Equipment required: Single use, disposable, "Block Tray"    Standing Status:   Standing    Number of Occurrences:   1    Order Specific Question:   Specify    Answer:   Block Tray   Neurostimulator Lead Revision & replacement    Neurostimulator battery revision for relocation into a deeper plane. Indication:  Implant pain.    Standing Status:   Standing    Number of Occurrences:   1   Chronic Opioid Analgesic:  Hydrocodone/APAP 5/325 1 tablet p.o. twice daily MME/day:10mg /day.   Medications ordered for procedure: Meds ordered this encounter  Medications   lidocaine (XYLOCAINE) 2 % (with pres) injection 400 mg   lactated ringers infusion 1,000 mL   midazolam (VERSED) 5 MG/5ML injection 1-2 mg    Make sure Flumazenil is available in the pyxis when using this medication. If oversedation occurs, administer 0.2 mg IV over 15 sec. If after 45 sec no response, administer 0.2 mg again over 1 min; may repeat at 1 min intervals; not to exceed 4 doses (1 mg)   fentaNYL (SUBLIMAZE) injection 25-50 mcg    Make sure Narcan is available in the pyxis when using this medication. In the event of respiratory depression (RR< 8/min): Titrate NARCAN (naloxone) in increments of 0.1 to 0.2 mg IV at 2-3 minute intervals, until desired degree of reversal.   ropivacaine (PF) 2 mg/mL (0.2%) (NAROPIN) injection 8 mL   dexamethasone (DECADRON) injection 10 mg   methylPREDNISolone acetate (DEPO-MEDROL) injection 80 mg   methocarbamol (ROBAXIN) 500 MG tablet    Sig: Take 2 tablets (1,000 mg total) by mouth at bedtime.    Dispense:  60 tablet    Refill:  5    Fill one day early if pharmacy is closed on scheduled refill date. May substitute for generic if available.   Medications administered: We administered lidocaine, lactated ringers, midazolam, fentaNYL, ropivacaine (PF) 2 mg/mL (0.2%), dexamethasone, and methylPREDNISolone acetate.  See the medical record for exact dosing, route, and time of administration.  Follow-up plan:   Return in about 2 weeks (around 09/02/2019) for (VV), (PP).  Considering:   Possible revision of spinal cord stimulator battery implant to reposition the battery deeper. Possible bilateral lumbar facet RFA #1 Possible bilateral SI joint RFA #1 Diagnostic bilateral IA hip  joint injection Diagnostic bilateral femoral + obturator NB Possible bilateral femoral + obturator nerve RFA Diagnostic right L1-2LESI Diagnostic [id number] Diagnostic right CESI Possible bilateral occipital nerve RFA Diagnostic bilateral IA shoulder joint injection Diagnostic bilateral suprascapular NB Possible bilateral suprascapular nerve RFA   Palliative PRN treatment(s):   Diagnostic bilateral GONB #2 Palliative bilateral cervical facet blocks #5 Palliative bilateral cervical facet RFA #2  Palliative right L2-3 interlaminar LESI #3  Palliative right L1 TFESI #2 Bilateral lumbar spinal cord stimulator trial (done)  Diagnostic bilateral lumbar facet block #3  Diagnostic bilateral SI joint block #3     Recent Visits Date Type Provider Dept  06/15/19 Office Visit Milinda Pointer, MD [facility]  Showing recent visits within past 90 days and meeting all other requirements   Today's Visits Date Type Provider Dept  08/19/19 Procedure visit Milinda Pointer, MD [facility]  Showing today's visits and meeting all other requirements   Future Appointments Date Type Provider Dept  09/01/19 Appointment Milinda Pointer, MD [facility]  Showing future appointments within next 90 days and meeting all other requirements   Disposition: Discharge home  Discharge Date & Time: 08/19/2019; 1133 hrs.   Primary Care Physician: Pleas Koch, NP Location: [facility] Note by: Gaspar Cola, MD Date: 08/19/2019; Time: 11:46 AM  Disclaimer:  Medicine is not an Chief Strategy Officer. The only guarantee in medicine is that nothing is guaranteed. It is important to note that the decision to proceed with this intervention was based on the information collected from the patient. The Data and conclusions were drawn from the patient's questionnaire, the interview, and the physical examination. Because the  information was provided in large part by the patient, it cannot be guaranteed that it has not been purposely or unconsciously manipulated. Every effort has been made to obtain as much relevant data as possible for this evaluation. It is important to note that the conclusions that lead to this procedure are derived in large part from the available data. Always take into account that the treatment will also be dependent on availability of resources and existing treatment guidelines, considered by other Pain Management Practitioners as being common knowledge and practice, at the time of the intervention. For Medico-Legal purposes, it is also important to point out that variation in procedural techniques and pharmacological choices are the acceptable norm. The indications, contraindications, technique, and results of the above procedure should only be interpreted and judged by a Board-Certified Interventional Pain Specialist with extensive familiarity and expertise in the same exact procedure and technique.

## 2019-08-19 NOTE — Patient Instructions (Signed)

## 2019-08-20 ENCOUNTER — Telehealth: Payer: Self-pay

## 2019-08-20 NOTE — Telephone Encounter (Signed)
Post procedure phone call. Patient states she is doing good.  

## 2019-08-31 ENCOUNTER — Encounter: Payer: Self-pay | Admitting: Pain Medicine

## 2019-08-31 NOTE — Progress Notes (Signed)
Pain relief after procedure (treated area only): (Questions asked to patient) 1. About 15 minutes after the procedure, while the area was still numb from the local anesthetics, were you having any pain in the area? First 1 hour: 100 % better. Initial 4-6 hours: 100 % better. 2. How many days was the area numb? Any benefit longer than 6 hours: 100 % better. 3. How much better is your pain now, when compared to before the procedure? Current benefit: 25 % better. 4. Can you move better now? Improvement in ROM (Range of Motion): No. 5. Can you do more now? Improvement in function: Yes. 4. Did you have any problems with the procedure? Side-effects/Complications: No.

## 2019-08-31 NOTE — Progress Notes (Signed)
Unsuccessful attempt to contact patient for Virtual Visit (Pain Management Telehealth)   Patient provided contact information:  [phone] (home); [phone] (mobile); (Preferred) [phone] [email or web address]   Pre-screening:  Our staff was successful in contacting Karen Edwards using the above provided information.   I unsuccessfully attempted to make contact with Karen Edwards on 09/01/2019 via telephone. I was unable to complete the virtual encounter due to call going directly to voicemail. I was able to leave a message where I clearly identify myself as Gaspar Cola, MD and I left a message to call us back to reschedule the call.  Post-Procedure Evaluation  Procedure: Diagnostic bilateral greater occipital nerve block #1 under fluoroscopic guidance and IV sedation Pre-procedure pain level: 4/10 Post-procedure: 0/10 (100% relief)  Sedation: Sedation provided.  Effectiveness during initial hour after procedure(Ultra-Short Term Relief):   100%.  Local anesthetic used: Long-acting (4-6 hours) Effectiveness: Defined as any analgesic benefit obtained secondary to the administration of local anesthetics. This carries significant diagnostic value as to the etiological location, or anatomical origin, of the pain. Duration of benefit is expected to coincide with the duration of the local anesthetic used.  Effectiveness during initial 4-6 hours after procedure(Short-Term Relief):   100%.  Long-term benefit: Defined as any relief past the pharmacologic duration of the local anesthetics.  Effectiveness past the initial 6 hours after procedure(Long-Term Relief):   100%.  Current benefits: Defined as benefit that persist at this time.   Analgesia:  <50% better Function: Karen Edwards reports improvement in function ROM: No improvement This information was gathered today from the nursing staff contact.  Pharmacotherapy Assessment  Analgesic: Hydrocodone/APAP 5/325 1 tablet p.o.  twice daily MME/day:10mg /day.   Monitoring: Pharmacotherapy: No side-effects or adverse reactions reported. [place] PMP: PDMP reviewed during this encounter.        UDS:  Summary  Date Value Ref Range Status  04/06/2018 FINAL  Final    Comment:    ==================================================================== TOXASSURE SELECT 13 (MW) ==================================================================== Test                             Result       Flag       Units Drug Present and Declared for Prescription Verification   Norhydrocodone                 228          EXPECTED   ng/mg creat    Norhydrocodone is an expected metabolite of hydrocodone. Drug Absent but Declared for Prescription Verification   Hydrocodone                    Not Detected UNEXPECTED ng/mg creat    Hydrocodone is almost always present in patients taking this drug    consistently. Absence of hydrocodone could be due to lapse of    time since the last dose or unusual pharmacokinetics (rapid    metabolism). ==================================================================== Test                      Result    Flag   Units      Ref Range   Creatinine              60               mg/dL      >=20 ==================================================================== Declared Medications:  The flagging and interpretation on this report are based on  the  following declared medications.  Unexpected results may arise from  inaccuracies in the declared medications.  **Note: The testing scope of this panel includes these medications:  Hydrocodone (Norco)  **Note: The testing scope of this panel does not include following  reported medications:  Acetaminophen (Norco)  Atorvastatin (Lipitor)  Cyanocobalamin  Fluoxetine (Prozac)  Methocarbamol (Robaxin)  Potassium  Supplement  Trazodone (Desyrel) ==================================================================== For clinical consultation, please call [phone]. ====================================================================     Follow-up plan:   No follow-ups on file.      Considering:   Possible revision of spinal cord stimulator battery implant to reposition the battery deeper. Possible bilateral lumbar facet RFA #1 Possible bilateral SI joint RFA #1 Diagnostic bilateral IA hip joint injection Diagnostic bilateral femoral + obturator NB Possible bilateral femoral + obturator nerve RFA Diagnostic right L1-2LESI Diagnostic [id number] Diagnostic right CESI Possible bilateral occipital nerve RFA Diagnostic bilateral IA shoulder joint injection Diagnostic bilateral suprascapular NB Possible bilateral suprascapular nerve RFA   Palliative PRN treatment(s):   Diagnostic bilateral GONB #2 Palliative bilateral cervical facet blocks #5 Palliative bilateral cervical facet RFA #2  Palliative right L2-3 interlaminar LESI #3  Palliative right L1 TFESI #2 Bilateral lumbar spinal cord stimulator trial (done)  Diagnostic bilateral lumbar facet block #3  Diagnostic bilateral SI joint block #3      Recent Visits Date Type Provider Dept  08/19/19 Procedure visit Milinda Pointer, MD [facility]  06/15/19 Office Visit Milinda Pointer, MD [facility]  Showing recent visits within past 90 days and meeting all other requirements   Today's Visits Date Type Provider Dept  09/01/19 Telemedicine Milinda Pointer, MD [facility]  Showing today's visits and meeting all other requirements   Future Appointments Date Type Provider Dept  11/24/19 Appointment Milinda Pointer, MD [facility]  Showing future appointments within next 90 days and meeting all other requirements    Note by: Gaspar Cola, MD Date: 09/01/2019; Time: 4:52 PM

## 2019-09-01 ENCOUNTER — Ambulatory Visit: Payer: PPO | Attending: Pain Medicine | Admitting: Pain Medicine

## 2019-09-01 ENCOUNTER — Other Ambulatory Visit: Payer: Self-pay

## 2019-09-01 DIAGNOSIS — M5481 Occipital neuralgia: Secondary | ICD-10-CM

## 2019-09-01 DIAGNOSIS — G4486 Cervicogenic headache: Secondary | ICD-10-CM

## 2019-09-01 DIAGNOSIS — R519 Headache, unspecified: Secondary | ICD-10-CM

## 2019-09-07 NOTE — Progress Notes (Signed)
Pain Management Virtual Encounter Note - Virtual Visit via Telephone Telehealth (real-time audio visits between healthcare provider and patient).   Patient's Phone No. & Preferred Pharmacy:  [phone] (home); [phone] (mobile); (Preferred) [phone] [email or web address]  [place] [place] [address] [place] [address] Phone: [phone] Fax: [phone]    Pre-screening note:  Our staff contacted Karen Edwards and offered her an "in person", "face-to-face" appointment versus a telephone encounter. She indicated preferring the telephone encounter, at this time.   Reason for Virtual Visit: COVID-19*  Social distancing based on CDC and AMA recommendations.   I contacted Karen Edwards on 09/08/2019 via telephone.      I clearly identified myself as Gaspar Cola, MD. I verified that I was speaking with the correct person using two identifiers (Name: Karen Edwards, and date of birth: [birth year]).  Advanced Informed Consent I sought verbal advanced consent from Karen Edwards for virtual visit interactions. I informed Karen Edwards of possible security and privacy concerns, risks, and limitations associated with providing "not-in-person" medical evaluation and management services. I also informed Karen Edwards of the availability of "in-person" appointments. Finally, I informed her that there would be a charge for the virtual visit and that she could be  personally, fully or partially, financially responsible for it. Karen Edwards expressed understanding and agreed to proceed.   Historic Elements   Karen Edwards is a 53 y.o. year old, female patient evaluated today after her last encounter by our practice on 08/20/2019. Karen Edwards  has a past medical history of Anxiety, Chronic back pain, Degenerative joint disease, Depression, Dyspnea, Hypotension, IBS (irritable bowel syndrome) (05/2015), and  MVP (mitral valve prolapse). She also  has a past surgical history that includes Cesarean section; Ankle surgery (Right, 03/02/2014); Hemorrhoid surgery (N/A, 06/16/2015); Cervical spine surgery (02/21/2016); Cesarean section with bilateral tubal ligation (1990); Colonoscopy (04/19/2015); Hysteroscopy w/D&C (12/24/2013); Colonoscopy with propofol (N/A, 05/18/2018); Esophagogastroduodenoscopy (egd) with propofol (N/A, 05/18/2018); and Spinal cord stimulator insertion (N/A, 06/18/2018). Karen Edwards has a current medication list which includes the following prescription(s): biotin, cascara sagrada, flax seed oil, fluoxetine, hydrocodone-acetaminophen, hydrocodone-acetaminophen, hydrocodone-acetaminophen, methocarbamol, and trazodone. She  reports that she has been smoking cigarettes. She has a 18.50 pack-year smoking history. She has never used smokeless tobacco. She reports current alcohol use of about 1.0 standard drinks of alcohol per week. She reports that she does not use drugs. Karen Edwards is allergic to no known allergies.   HPI  Today, she is being contacted for both, medication management and a post-procedure assessment.  The patient indicates doing well with the current medication regimen. No adverse reactions or side effects reported to the medications.  The patient indicates having done well with the bilateral greater occipital nerve block and therefore, she may be a good candidate for radiofrequency ablation.   At this point, we are pending to revise her spinal cord stimulator battery since she seems to have lost some weight in the device is closer to the skin and she is complaining of some pain at the level of the scar.  The plan is to bury the device deeper.  At the time of this note, that we did not have a specific appointment for this surgery but were tentatively looking at the first Tuesday after Thanksgiving.  Whether or not we can do it on that day depends on OR availability.  The patient was  made aware  of this and she understood and accepted.  She is well aware of the risk and possible complications of the procedure and again she has agreed to proceed with it.  Post-Procedure Evaluation  Procedure: Diagnostic bilateral greater occipital nerve block #1 under fluoroscopic guidance and IV sedation Pre-procedure pain level: 4/10 Post-procedure: 0/10 (100% relief)  Sedation: Sedation provided.  Effectiveness during initial hour after procedure(Ultra-Short Term Relief):   100%.  Local anesthetic used: Long-acting (4-6 hours) Effectiveness: Defined as any analgesic benefit obtained secondary to the administration of local anesthetics. This carries significant diagnostic value as to the etiological location, or anatomical origin, of the pain. Duration of benefit is expected to coincide with the duration of the local anesthetic used.  Effectiveness during initial 4-6 hours after procedure(Short-Term Relief):   100%.  Long-term benefit: Defined as any relief past the pharmacologic duration of the local anesthetics.  Effectiveness past the initial 6 hours after procedure(Long-Term Relief):   100%.  Current benefits: Defined as benefit that persist at this time.              Analgesia:  <50% better Function: Karen Edwards reports improvement in function ROM: No improvement This information was gathered today from the nursing staff contact.  Pharmacotherapy Assessment  Analgesic: Hydrocodone/APAP 5/325 1 tablet p.o. twice daily MME/day:10mg /day.   Monitoring: Pharmacotherapy: No side-effects or adverse reactions reported. [place] PMP: PDMP reviewed during this encounter.       Compliance: No problems identified. Effectiveness: Clinically acceptable. Plan: Refer to "POC".  UDS:  Summary  Date Value Ref Range Status  04/06/2018 FINAL  Final    Comment:    ==================================================================== TOXASSURE SELECT 13  (MW) ==================================================================== Test                             Result       Flag       Units Drug Present and Declared for Prescription Verification   Norhydrocodone                 228          EXPECTED   ng/mg creat    Norhydrocodone is an expected metabolite of hydrocodone. Drug Absent but Declared for Prescription Verification   Hydrocodone                    Not Detected UNEXPECTED ng/mg creat    Hydrocodone is almost always present in patients taking this drug    consistently. Absence of hydrocodone could be due to lapse of    time since the last dose or unusual pharmacokinetics (rapid    metabolism). ==================================================================== Test                      Result    Flag   Units      Ref Range   Creatinine              60               mg/dL      >=20 ==================================================================== Declared Medications:  The flagging and interpretation on this report are based on the  following declared medications.  Unexpected results may arise from  inaccuracies in the declared medications.  **Note: The testing scope of this panel includes these medications:  Hydrocodone (Norco)  **Note: The testing scope of this panel does not include following  reported medications:  Acetaminophen (Norco)  Atorvastatin (  Lipitor)  Cyanocobalamin  Fluoxetine (Prozac)  Methocarbamol (Robaxin)  Potassium  Supplement  Trazodone (Desyrel) ==================================================================== For clinical consultation, please call [phone]. ====================================================================    Laboratory Chemistry Profile (12 mo)  Renal: No results found for requested labs within last 8760 hours.  Lab Results  Component Value Date   GFR 76.93 05/20/2017   GFRAA 93 09/08/2017   GFRNONAA 81 09/08/2017   Hepatic: No results found for requested labs  within last 8760 hours. Lab Results  Component Value Date   AST 18 09/08/2017   ALT 12 (L) 01/30/2017   Other: No results found for requested labs within last 8760 hours. Note: Above Lab results reviewed.  Imaging  Last 90 days:  [facility]g [facility] C-arm 1-60 Min No Report  Result Date: 08/19/2019 Fluoro was used, but no Radiologist interpretation will be provided. Please refer to "NOTES" tab for provider progress note.   Assessment  The primary encounter diagnosis was Pain due to any device, implant or graft, sequela. Diagnoses of Chronic low back pain (Primary Area of Pain) (Bilateral) (R>L), Chronic lower extremity pain (Secondary Area of Pain) (Bilateral) (R>L), Chronic hip pain (Tertiary Area of Pain) (Bilateral) (R>L), Chronic musculoskeletal pain, and Chronic pain syndrome were also pertinent to this visit.  Plan of Care  I am having Karen Edwards start on HYDROcodone-acetaminophen and HYDROcodone-acetaminophen. I am also having her maintain her Cascara Sagrada, Flax Seed Oil, FLUoxetine, trazodone, Biotin, methocarbamol, and HYDROcodone-acetaminophen.  Pharmacotherapy (Medications Ordered): Meds ordered this encounter  Medications  . methocarbamol (ROBAXIN) 500 MG tablet    Sig: Take 2 tablets (1,000 mg total) by mouth at bedtime.    Dispense:  180 tablet    Refill:  1    Fill one day early if pharmacy is closed on scheduled refill date. May substitute for generic if available.  Marland Kitchen HYDROcodone-acetaminophen (NORCO/VICODIN) 5-325 MG tablet    Sig: Take 1-2 tablets by mouth at bedtime. Must last 30 days.    Dispense:  60 tablet    Refill:  0    Chronic Pain: STOP Act (Not applicable) Fill 1 day early if closed on refill date. Do not fill until: 09/20/2019. To last until: 10/20/2019. Avoid benzodiazepines within 8 hours of opioids  . HYDROcodone-acetaminophen (NORCO/VICODIN) 5-325 MG tablet    Sig: Take 1-2 tablets by mouth at bedtime. Must last 30 days.    Dispense:  60  tablet    Refill:  0    Chronic Pain: STOP Act (Not applicable) Fill 1 day early if closed on refill date. Do not fill until: 10/20/2019. To last until: 11/19/2019. Avoid benzodiazepines within 8 hours of opioids  . HYDROcodone-acetaminophen (NORCO/VICODIN) 5-325 MG tablet    Sig: Take 1-2 tablets by mouth at bedtime. Must last 30 days.    Dispense:  60 tablet    Refill:  0    Chronic Pain: STOP Act (Not applicable) Fill 1 day early if closed on refill date. Do not fill until: 11/19/2019. To last until: 12/19/2019. Avoid benzodiazepines within 8 hours of opioids   Orders:  No orders of the defined types were placed in this encounter.  Follow-up plan:   No follow-ups on file.      Considering:   Possible revision of spinal cord stimulator battery implant to reposition the battery deeper. Possible bilateral lumbar facet RFA #1 Possible bilateral SI joint RFA #1 Diagnostic bilateral IA hip joint injection Diagnostic bilateral femoral + obturator NB Possible bilateral femoral + obturator nerve RFA Diagnostic  right L1-2LESI Diagnostic bilateralL4TFESI Diagnostic right CESI Possible bilateral occipital nerve RFA #1 Diagnostic bilateral IA shoulder joint injection Diagnostic bilateral suprascapular NB Possible bilateral suprascapular nerve RFA   Palliative PRN treatment(s):   Diagnostic bilateral GONB #2 Palliative bilateral cervical facet blocks #5 Palliative bilateral cervical facet RFA #2  Palliative right L2-3 interlaminar LESI #3  Palliative right L1 TFESI #2 Bilateral lumbar spinal cord stimulator trial (done)  Diagnostic bilateral lumbar facet block #3  Diagnostic bilateral SI joint block #3     Recent Visits Date Type Provider Dept  09/01/19 Telemedicine Milinda Pointer, MD [facility]  08/19/19 Procedure visit Milinda Pointer, MD [facility]  06/15/19 Office Visit Milinda Pointer, MD [facility]  Showing recent  visits within past 90 days and meeting all other requirements   Today's Visits Date Type Provider Dept  09/08/19 Telemedicine Milinda Pointer, MD [facility]  Showing today's visits and meeting all other requirements   Future Appointments Date Type Provider Dept  11/24/19 Appointment Milinda Pointer, MD [facility]  Showing future appointments within next 90 days and meeting all other requirements   I discussed the assessment and treatment plan with the patient. The patient was provided an opportunity to ask questions and all were answered. The patient agreed with the plan and demonstrated an understanding of the instructions.  Patient advised to call back or seek an in-person evaluation if the symptoms or condition worsens.  Total duration of non-face-to-face encounter: 15 minutes.  Note by: Gaspar Cola, MD Date: 09/08/2019; Time: 8:02 AM  Note: This dictation was prepared with Dragon dictation. Any transcriptional errors that may result from this process are unintentional.  Disclaimer:  * Given the special circumstances of the COVID-19 pandemic, the federal government has announced that the Office for Civil Rights (OCR) will exercise its enforcement discretion and will not impose penalties on physicians using telehealth in the event of noncompliance with regulatory requirements under the [place] (HIPAA) in connection with the good faith provision of telehealth during the XX123456 national public health emergency. ([place])

## 2019-09-08 ENCOUNTER — Ambulatory Visit: Payer: PPO | Attending: Pain Medicine | Admitting: Pain Medicine

## 2019-09-08 ENCOUNTER — Other Ambulatory Visit: Payer: Self-pay

## 2019-09-08 DIAGNOSIS — M7918 Myalgia, other site: Secondary | ICD-10-CM

## 2019-09-08 DIAGNOSIS — M79605 Pain in left leg: Secondary | ICD-10-CM

## 2019-09-08 DIAGNOSIS — G8929 Other chronic pain: Secondary | ICD-10-CM

## 2019-09-08 DIAGNOSIS — M25551 Pain in right hip: Secondary | ICD-10-CM | POA: Diagnosis not present

## 2019-09-08 DIAGNOSIS — T85848S Pain due to other internal prosthetic devices, implants and grafts, sequela: Secondary | ICD-10-CM

## 2019-09-08 DIAGNOSIS — M79604 Pain in right leg: Secondary | ICD-10-CM | POA: Diagnosis not present

## 2019-09-08 DIAGNOSIS — M5441 Lumbago with sciatica, right side: Secondary | ICD-10-CM | POA: Diagnosis not present

## 2019-09-08 DIAGNOSIS — G894 Chronic pain syndrome: Secondary | ICD-10-CM | POA: Diagnosis not present

## 2019-09-08 DIAGNOSIS — M25552 Pain in left hip: Secondary | ICD-10-CM | POA: Diagnosis not present

## 2019-09-08 DIAGNOSIS — M5442 Lumbago with sciatica, left side: Secondary | ICD-10-CM

## 2019-09-08 MED ORDER — HYDROCODONE-ACETAMINOPHEN 5-325 MG PO TABS: 1.0000 | ORAL_TABLET | Freq: Every day | ORAL | 0 refills | Status: DC

## 2019-09-08 MED ORDER — METHOCARBAMOL 500 MG PO TABS: 1000.0000 mg | ORAL_TABLET | Freq: Every day | ORAL | 1 refills | Status: DC

## 2019-09-09 ENCOUNTER — Other Ambulatory Visit: Payer: Self-pay | Admitting: *Deleted

## 2019-09-13 ENCOUNTER — Encounter: Payer: PPO | Admitting: Pain Medicine

## 2019-09-16 ENCOUNTER — Other Ambulatory Visit: Payer: Self-pay | Admitting: Primary Care

## 2019-09-16 DIAGNOSIS — G4700 Insomnia, unspecified: Secondary | ICD-10-CM

## 2019-09-16 DIAGNOSIS — F329 Major depressive disorder, single episode, unspecified: Secondary | ICD-10-CM

## 2019-09-16 DIAGNOSIS — F32A Depression, unspecified: Secondary | ICD-10-CM

## 2019-09-17 NOTE — Telephone Encounter (Signed)
Patient is overdue for general follow up/CPE. If she prefers to complete a virtual visit for follow up then that's fine, please place in a convenient slot. Needs visit for further refills.

## 2019-09-21 ENCOUNTER — Other Ambulatory Visit
Admission: RE | Admit: 2019-09-21 | Discharge: 2019-09-21 | Disposition: A | Discharge status: Home / Self Care | Payer: PPO | Source: Ambulatory Visit | Attending: Pain Medicine | Admitting: Pain Medicine

## 2019-09-21 ENCOUNTER — Other Ambulatory Visit: Payer: Self-pay

## 2019-09-21 HISTORY — DX: Headache, unspecified: R51.9

## 2019-09-21 NOTE — Patient Instructions (Signed)
Your procedure is scheduled on: unsure Report to Day Surgery. To find out your arrival time please call [phone] between 1PM - 3PM on on the day before the surgery.  Remember: Instructions that are not followed completely may result in serious medical risk,  up to and including death, or upon the discretion of your surgeon and anesthesiologist your  surgery may need to be rescheduled.     _X__ 1. Do not eat food after midnight the night before your procedure.                 No gum chewing or hard candies. You may drink clear liquids up to 2 hours                 before you are scheduled to arrive for your surgery- DO not drink clear                 liquids within 2 hours of the start of your surgery.                 Clear Liquids include:  water, apple juice without pulp, clear carbohydrate                 drink such as Clearfast of Gatorade, Black Coffee or Tea (Do not add                 anything to coffee or tea).  __X__2.  On the morning of surgery brush your teeth with toothpaste and water, you                may rinse your mouth with mouthwash if you wish.  Do not swallow any toothpaste of mouthwash.     _X__ 3.  No Alcohol for 24 hours before or after surgery.   _X__ 4.  Do Not Smoke or use e-cigarettes For 24 Hours Prior to Your Surgery.                 Do not use any chewable tobacco products for at least 6 hours prior to                 surgery.  ____  5.  Bring all medications with you on the day of surgery if instructed.   ____  6.  Notify your doctor if there is any change in your medical condition      (cold, fever, infections).     Do not wear jewelry, make-up, hairpins, clips or nail polish. Do not wear lotions, powders, or perfumes. You may wear deodorant. Do not shave 48 hours prior to surgery. Men may shave face and neck. Do not bring valuables to the hospital.    [facility] is not responsible for any belongings or  valuables.  Contacts, dentures or bridgework may not be worn into surgery. Leave your suitcase in the car. After surgery it may be brought to your room. For patients admitted to the hospital, discharge time is determined by your treatment team.   Patients discharged the day of surgery will not be allowed to drive home.   Please read over the following fact sheets that you were given:    _x___ Take these medicines the morning of surgery with A SIP OF WATER:    1. HYDROcodone-acetaminophen (NORCO/VICODIN) 5-325 MG tablet if needed  2. FLUoxetine (PROZAC) 20 MG capsule  3.   4.  5.  6.  ____ Fleet Enema (as directed)   __x__  Use CHG Soap as directed (if you acquire) or just shower with antibacterial soap the night before and the morning of surgery  ____ Use inhalers on the day of surgery  ____ Stop metformin 2 days prior to surgery    ____ Take 1/2 of usual insulin dose the night before surgery. No insulin the morning          of surgery.   ____ Stop Coumadin/Plavix/aspirin on   __x__ Stop Anti-inflammatories No aleve ibuprofen or aspirin 7 days before your procedure   __x__ Stop supplements until after surgeryBiotin 10000 MCG TABS.Flaxseed, Linseed, (FLAX SEED OIL PO),    ____ Bring C-Pap to the hospital.

## 2019-09-21 NOTE — Patient Instructions (Signed)
Your procedure is scheduled on: Tues 12/1 Report to Day Surgery. To find out your arrival time please call [phone] between 1PM - 3PM on Mon 11/30.  Remember: Instructions that are not followed completely may result in serious medical risk,  up to and including death, or upon the discretion of your surgeon and anesthesiologist your  surgery may need to be rescheduled.     _X__ 1. Do not eat food after midnight the night before your procedure.                 No gum chewing or hard candies. You may drink clear liquids up to 2 hours                 before you are scheduled to arrive for your surgery- DO not drink clear                 liquids within 2 hours of the start of your surgery.                 Clear Liquids include:  water, apple juice without pulp, clear carbohydrate                 drink such as Clearfast of Gatorade, Black Coffee or Tea (Do not add                 anything to coffee or tea).  __X__2.  On the morning of surgery brush your teeth with toothpaste and water, you                may rinse your mouth with mouthwash if you wish.  Do not swallow any toothpaste of mouthwash.     _X__ 3.  No Alcohol for 24 hours before or after surgery.   _X__ 4.  Do Not Smoke or use e-cigarettes For 24 Hours Prior to Your Surgery.                 Do not use any chewable tobacco products for at least 6 hours prior to                 surgery.  ____  5.  Bring all medications with you on the day of surgery if instructed.   __x__  6.  Notify your doctor if there is any change in your medical condition      (cold, fever, infections).     Do not wear jewelry, make-up, hairpins, clips or nail polish. Do not wear lotions, powders, or perfumes. You may wear deodorant. Do not shave 48 hours prior to surgery. Men may shave face and neck. Do not bring valuables to the hospital.    [facility] is not responsible for any belongings or valuables.  Contacts, dentures  or bridgework may not be worn into surgery. Leave your suitcase in the car. After surgery it may be brought to your room. For patients admitted to the hospital, discharge time is determined by your treatment team.   Patients discharged the day of surgery will not be allowed to drive home.   Please read over the following fact sheets that you were given:    __x__ Take these medicines the morning of surgery with A SIP OF WATER:    1.HYDROcodone-acetaminophen (NORCO/VICODIN) 5-325 MG tablet  2. methocarbamol (ROBAXIN) 500 MG tablet  3.   4.  5.  6.  ____ Fleet Enema (as directed)   ___x_ Use CHG Soap as directed if  you obtain some.  If not just shower with your regular soap the night before and the morning of the surgery  ____ Use inhalers on the day of surgery  ____ Stop metformin 2 days prior to surgery    ____ Take 1/2 of usual insulin dose the night before surgery. No insulin the morning          of surgery.   ____ Stop Coumadin/Plavix/aspirin on   ____ Stop Anti-inflammatories on    __x__ Stop supplements until after surgery.  Biotin 10000 MCG TABS,Flaxseed, Linseed, (FLAX SEED OIL PO)  ____ Bring C-Pap to the hospital.

## 2019-09-21 NOTE — Pre-Procedure Instructions (Signed)
Contacted patient after completion of her PAT phone call to let her know that her surgery has been cancelled per  MD due to the Covid surge.  She should not go for her Covid test in preparation for this surgery.  I will email her a generic PAT AVS for use when the procedure is rescheduled.

## 2019-09-21 NOTE — Pre-Procedure Instructions (Signed)
Secure chat with Dr.Naveria to request orders for upcoming PAT phone call. When he answered he said he was cancelling the surgery due to the Covid surge.

## 2019-09-22 ENCOUNTER — Other Ambulatory Visit: Payer: PPO

## 2019-09-24 ENCOUNTER — Other Ambulatory Visit (HOSPITAL_COMMUNITY)
Admission: RE | Admit: 2019-09-24 | Discharge: 2019-09-24 | Disposition: A | Discharge status: Home / Self Care | Payer: PPO | Source: Ambulatory Visit | Attending: Pain Medicine | Admitting: Pain Medicine

## 2019-09-24 ENCOUNTER — Other Ambulatory Visit: Payer: PPO

## 2019-09-28 ENCOUNTER — Encounter: Admission: RE | Payer: Self-pay | Source: Home / Self Care

## 2019-09-28 ENCOUNTER — Ambulatory Visit: Admission: RE | Admit: 2019-09-28 | Payer: PPO | Source: Home / Self Care | Admitting: Pain Medicine

## 2019-09-28 SURGERY — SPINAL CORD STIMULATOR BATTERY EXCHANGE
Anesthesia: Monitor Anesthesia Care

## 2019-10-05 ENCOUNTER — Telehealth: Payer: Self-pay | Admitting: Pain Medicine

## 2019-10-05 NOTE — Telephone Encounter (Signed)
Patient asking when her SCS revision will be rescheduled? She states she is getting worse. Would like to have this done. Please advise patient

## 2019-10-05 NOTE — Telephone Encounter (Addendum)
Spoke with Blanch Media and she says Jocelyn Lamer knows about this.

## 2019-10-07 ENCOUNTER — Ambulatory Visit: Payer: PPO | Admitting: Pain Medicine

## 2019-10-15 ENCOUNTER — Telehealth (INDEPENDENT_AMBULATORY_CARE_PROVIDER_SITE_OTHER): Payer: PPO | Admitting: Primary Care

## 2019-10-15 ENCOUNTER — Other Ambulatory Visit: Payer: Self-pay

## 2019-10-15 DIAGNOSIS — F329 Major depressive disorder, single episode, unspecified: Secondary | ICD-10-CM

## 2019-10-15 DIAGNOSIS — Z86010 Personal history of colonic polyps: Secondary | ICD-10-CM

## 2019-10-15 DIAGNOSIS — G4700 Insomnia, unspecified: Secondary | ICD-10-CM | POA: Diagnosis not present

## 2019-10-15 DIAGNOSIS — G894 Chronic pain syndrome: Secondary | ICD-10-CM | POA: Diagnosis not present

## 2019-10-15 DIAGNOSIS — F32A Depression, unspecified: Secondary | ICD-10-CM

## 2019-10-15 DIAGNOSIS — E785 Hyperlipidemia, unspecified: Secondary | ICD-10-CM | POA: Diagnosis not present

## 2019-10-15 MED ORDER — FLUOXETINE HCL 20 MG PO CAPS: 60.0000 mg | ORAL_CAPSULE | Freq: Every day | ORAL | 3 refills | Status: DC

## 2019-10-15 MED ORDER — TRAZODONE HCL 100 MG PO TABS: 300.0000 mg | ORAL_TABLET | Freq: Every day | ORAL | 3 refills | Status: DC

## 2019-10-15 NOTE — Assessment & Plan Note (Addendum)
Overall doing well on fluoxetine 90 mg.  Trazodone may be also contributing to symptom control.  Continue same.

## 2019-10-15 NOTE — Assessment & Plan Note (Signed)
LDL from 2019 above goal, she is never been treated for hyperlipidemia. Repeat labs pending.

## 2019-10-15 NOTE — Assessment & Plan Note (Signed)
Following with pain management. Spinal cord stimulator surgery postponed due to COVID-19.

## 2019-10-15 NOTE — Assessment & Plan Note (Signed)
Continues to intermittently struggle with insomnia despite trazodone 300 mg. Discussed use of Calm Aid OTC.  She would like to continue her trazodone as it does help several nights weekly.  Continue trazodone and fluoxetine.  She will update regarding Calm Aid use.

## 2019-10-15 NOTE — Patient Instructions (Signed)
You can try using Calm Aid over-the-counter to help with sleep.  I sent refills to your pharmacy.  Schedule a lab only appointment as discussed.  It was a pleasure to see you today! Allie Bossier, NP-C

## 2019-10-15 NOTE — Assessment & Plan Note (Signed)
Colonoscopy completed in 2019, due again in 2024.

## 2019-10-15 NOTE — Progress Notes (Signed)
Subjective:    Patient ID: Karen Edwards, female    DOB: 26-Aug-1966, 53 y.o.   MRN: [id number]  HPI  Virtual Visit via Video Note  I connected with Karen Edwards on 10/15/19 at  2:20 PM EST by a video enabled telemedicine application and verified that I am speaking with the correct person using two identifiers.  Location: Patient: Home Provider: Office   I discussed the limitations of evaluation and management by telemedicine and the availability of in person appointments. The patient expressed understanding and agreed to proceed.  History of Present Illness:  Karen Edwards is a 53 year old female who presents today for follow up.  Currently managed on fluoxetine 90 mg daily and is overall doing well. She continues to struggle at times with sleep despite use of Trazodone 300 mg. She could not tolerate Ambien and has tried several agents OTC.  She is needing refills today and would like to continue her current regimen.   Observations/Objective:  Alert and oriented. Appears well, not sickly. No distress. Speaking in complete sentences.   Assessment and Plan:  See problem based charting.  Follow Up Instructions:  You can try using Calm Aid over-the-counter to help with sleep.  I sent refills to your pharmacy.  Schedule a lab only appointment as discussed.  It was a pleasure to see you today! Allie Bossier, NP-C    I discussed the assessment and treatment plan with the patient. The patient was provided an opportunity to ask questions and all were answered. The patient agreed with the plan and demonstrated an understanding of the instructions.   The patient was advised to call back or seek an in-person evaluation if the symptoms worsen or if the condition fails to improve as anticipated.   Pleas Koch, NP    Review of Systems  Respiratory: Negative for shortness of breath.   Cardiovascular: Negative for chest pain.  Psychiatric/Behavioral: Positive for  sleep disturbance. The patient is not nervous/anxious.        See HPI       Past Medical History:  Diagnosis Date  . Anxiety   . Chronic back pain   . Degenerative joint disease   . Depression   . Dyspnea   . Headache    neck pain  . Hypotension   . IBS (irritable bowel syndrome) 05/2015  . MVP (mitral valve prolapse)    patient denies     Social History   Socioeconomic History  . Marital status: Widowed    Spouse name: Not on file  . Number of children: 2  . Years of education: Not on file  . Highest education level: Not on file  Occupational History  . Occupation: disabled  Tobacco Use  . Smoking status: Current Every Day Smoker    Packs/day: 0.50    Years: 37.00    Pack years: 18.50    Types: Cigarettes  . Smokeless tobacco: Never Used  Substance and Sexual Activity  . Alcohol use: Yes    Alcohol/week: 1.0 standard drinks    Types: 1 Cans of beer per week    Comment: drinks an occasional beer  . Drug use: No  . Sexual activity: Yes    Birth control/protection: None, Surgical    Comment: tubal ligation  Other Topics Concern  . Not on file  Social History Narrative   Married.   2 children. One boy and one girl.   On disability.   Playing with her dogs,  gardening.   Social Determinants of Health   Financial Resource Strain:   . Difficulty of Paying Living Expenses: Not on file  Food Insecurity:   . Worried About Charity fundraiser in the Last Year: Not on file  . Ran Out of Food in the Last Year: Not on file  Transportation Needs:   . Lack of Transportation (Medical): Not on file  . Lack of Transportation (Non-Medical): Not on file  Physical Activity:   . Days of Exercise per Week: Not on file  . Minutes of Exercise per Session: Not on file  Stress:   . Feeling of Stress : Not on file  Social Connections:   . Frequency of Communication with Friends and Family: Not on file  . Frequency of Social Gatherings with Friends and Family: Not on file  .  Attends Religious Services: Not on file  . Active Member of Clubs or Organizations: Not on file  . Attends Archivist Meetings: Not on file  . Marital Status: Not on file  Intimate Partner Violence:   . Fear of Current or Ex-Partner: Not on file  . Emotionally Abused: Not on file  . Physically Abused: Not on file  . Sexually Abused: Not on file    Past Surgical History:  Procedure Laterality Date  . ANKLE SURGERY Right 03/02/2014   repair of tendon and sural nerve right ankle  . CERVICAL SPINE SURGERY  02/21/2016   arthrodesis of anterior cervical spine (fusion of C5-6,C6-7 and insertion of bone allograft  . CESAREAN SECTION    . CESAREAN SECTION WITH BILATERAL TUBAL LIGATION  1990  . COLONOSCOPY  04/19/2015  . COLONOSCOPY WITH PROPOFOL N/A 05/18/2018   Procedure: COLONOSCOPY WITH PROPOFOL;  Surgeon: Lin Landsman, MD;  Location: [facility] ENDOSCOPY;  Service: Gastroenterology;  Laterality: N/A;  . ESOPHAGOGASTRODUODENOSCOPY (EGD) WITH PROPOFOL N/A 05/18/2018   Procedure: ESOPHAGOGASTRODUODENOSCOPY (EGD) WITH PROPOFOL;  Surgeon: Lin Landsman, MD;  Location: [facility] ENDOSCOPY;  Service: Gastroenterology;  Laterality: N/A;  . HEMORRHOID SURGERY N/A 06/16/2015   Procedure: HEMORRHOIDECTOMY;  Surgeon: Leonie Green, MD;  Location: [facility] ORS;  Service: General;  Laterality: N/A;  . HYSTEROSCOPY WITH D & C  12/24/2013  . SPINAL CORD STIMULATOR INSERTION N/A 06/18/2018   Procedure: LUMBAR SPINAL CORD STIMULATOR INSERTION;  Surgeon: Milinda Pointer, MD;  Location: [facility] ORS;  Service: Neurosurgery;  Laterality: N/A;    Family History  Problem Relation Age of Onset  . Arthritis Mother   . Depression Mother   . Hyperlipidemia Mother   . Hypertension Mother   . Kidney disease Mother   . Vision loss Mother   . Hypothyroidism Mother   . Hearing loss Father   . Hyperlipidemia Father   . Hypertension Father   . Diabetes Mellitus I Maternal Grandmother   . Stroke  Maternal Grandmother   . Congestive Heart Failure Maternal Grandfather   . Heart attack Maternal Grandfather   . Prostate cancer Paternal Grandfather   . Thyroid disease Maternal Aunt   . Diabetes Maternal Aunt   . Gout Maternal Aunt     No Known Allergies  Current Outpatient Medications on File Prior to Visit  Medication Sig Dispense Refill  . Biotin 10000 MCG TABS Take 10,000 mcg by mouth daily.     . Flaxseed, Linseed, (FLAX SEED OIL PO) Take 1,200 mg by mouth daily.     Marland Kitchen FLUoxetine (PROZAC) 20 MG capsule TAKE 3 CAPSULES BY MOUTH ONCE DAILY 90 capsule 0  .  HYDROcodone-acetaminophen (NORCO/VICODIN) 5-325 MG tablet Take 1-2 tablets by mouth at bedtime. Must last 30 days. 60 tablet 0  . [START ON 10/20/2019] HYDROcodone-acetaminophen (NORCO/VICODIN) 5-325 MG tablet Take 1-2 tablets by mouth at bedtime. Must last 30 days. 60 tablet 0  . [START ON 11/19/2019] HYDROcodone-acetaminophen (NORCO/VICODIN) 5-325 MG tablet Take 1-2 tablets by mouth at bedtime. Must last 30 days. 60 tablet 0  . methocarbamol (ROBAXIN) 500 MG tablet Take 2 tablets (1,000 mg total) by mouth at bedtime. 180 tablet 1  . traZODone (DESYREL) 100 MG tablet TAKE 3 TABLETS BY MOUTH AT BEDTIME 90 tablet 0   No current facility-administered medications on file prior to visit.    LMP 05/05/2017 (Exact Date)    Objective:   Physical Exam  Constitutional: She is oriented to person, place, and time. She appears well-nourished.  Respiratory: Effort normal.  Neurological: She is alert and oriented to person, place, and time.  Psychiatric: She has a normal mood and affect.           Assessment & Plan:

## 2019-10-25 ENCOUNTER — Other Ambulatory Visit: Payer: Self-pay | Admitting: Pain Medicine

## 2019-10-25 DIAGNOSIS — G894 Chronic pain syndrome: Secondary | ICD-10-CM

## 2019-11-23 ENCOUNTER — Encounter: Payer: Self-pay | Admitting: Pain Medicine

## 2019-11-23 NOTE — Progress Notes (Signed)
Patient: Karen Edwards  Service Category: E/M  Provider: Gaspar Cola, MD  DOB: 04-Aug-1966  DOS: 11/24/2019  Location: Office  MRN: [id number]  Setting: Ambulatory outpatient  Referring Provider: Pleas Koch, NP  Type: Established Patient  Specialty: Interventional Pain Management  PCP: Pleas Koch, NP  Location: Remote location  Delivery: TeleHealth     Virtual Encounter - Pain Management PROVIDER NOTE: Information contained herein reflects review and annotations entered in association with encounter. Interpretation of such information and data should be left to medically-trained personnel. Information provided to patient can be located elsewhere in the medical record under "Patient Instructions". Document created using STT-dictation technology, any transcriptional errors that may result from process are unintentional.    Contact & Pharmacy Preferred: [phone] Home: [phone] (home) Mobile: [phone] (mobile) E-mail: [email or web address]  [place], [place] [address] [place] [address] Phone: [phone] Fax: [phone]   Pre-screening  Karen Edwards offered "in-person" vs "virtual" encounter. She indicated preferring virtual for this encounter.   Reason COVID-19*  Social distancing based on CDC and AMA recommendations.   I contacted Karen Edwards on 11/24/2019 via telephone.      I clearly identified myself as Gaspar Cola, MD. I verified that I was speaking with the correct person using two identifiers (Name: Karen Edwards, and date of birth: 1965/12/06).  Consent I sought verbal advanced consent from Karen Edwards for virtual visit interactions. I informed Karen Edwards of possible security and privacy concerns, risks, and limitations associated with providing "not-in-person" medical evaluation and management services. I also informed Karen Edwards of the availability  of "in-person" appointments. Finally, I informed her that there would be a charge for the virtual visit and that she could be  personally, fully or partially, financially responsible for it. Karen Edwards expressed understanding and agreed to proceed.   Historic Elements   Karen Edwards is a 54 y.o. year old, female patient evaluated today after her last encounter by our practice on 10/25/2019. Karen Edwards  has a past medical history of Anxiety, Chronic back pain, Degenerative joint disease, Depression, Dyspnea, Headache, Hypotension, IBS (irritable bowel syndrome) (05/2015), and MVP (mitral valve prolapse). She also  has a past surgical history that includes Cesarean section; Ankle surgery (Right, 03/02/2014); Hemorrhoid surgery (N/A, 06/16/2015); Cervical spine surgery (02/21/2016); Cesarean section with bilateral tubal ligation (1990); Colonoscopy (04/19/2015); Hysteroscopy with D & C (12/24/2013); Colonoscopy with propofol (N/A, 05/18/2018); Esophagogastroduodenoscopy (egd) with propofol (N/A, 05/18/2018); and Spinal cord stimulator insertion (N/A, 06/18/2018). Karen Edwards has a current medication list which includes the following prescription(s): biotin, flaxseed (linseed), fluoxetine, [START ON 12/19/2019] hydrocodone-acetaminophen, [START ON 01/18/2020] hydrocodone-acetaminophen, [START ON 02/17/2020] hydrocodone-acetaminophen, methocarbamol, trazodone, and lidocaine. She  reports that she has been smoking cigarettes. She has a 18.50 pack-year smoking history. She has never used smokeless tobacco. She reports current alcohol use of about 1.0 standard drinks of alcohol per week. She reports that she does not use drugs. Karen Edwards has No Known Allergies.   HPI  Today, she is being contacted for medication management. The patient indicates doing well with the current medication regimen. No adverse reactions or side effects reported to the medications.   The patient reports that she has been  experiencing more pain than usual on the left lower back.  She was pending to have a surgery to revise the spinal cord stimulator anchor and bury it deeper.  Unfortunately, the surgery was canceled secondary to the COVID-19 pandemic.  Since they have recently canceled all elective surgeries, we will hold on this until we have access to the OR again.  Meanwhile, I will be prescribing some lidocaine ointment for her to apply to the area that she describes as having hypersensitivity.  In addition to this, she is also describing pain that is bilateral, but worse on the left side of her lower back and going down the back of the leg to the level of the knee.  The patient has an MRI with evidence of lumbar facet arthropathy.  Because of this I asked her today to hyperextend and rotate and she indicated that this reproduced her pain.  In view of this, today I will be scheduling her to return for a palliative bilateral lumbar facet block.  Previously we had been working on this area with the goal of doing a radiofrequency ablation, but this was interrupted by the pandemic.  At this point, since we are still able to do procedures at the clinic, we will go ahead and have her come in for the diagnostic/palliative lumbar facet block and if this provides her again with good relief of the pain, at least temporarily, then we will go ahead and do the radiofrequency.  In addition to this, the patient indicates that her neck pain is beginning to come back.  We did a radiofrequency ablation of her cervical facets around July and this seems to be wearing off.  Will address this after we deal with the low back pain problem.  Pharmacotherapy Assessment  Analgesic: Hydrocodone/APAP 5/325 1 tablet p.o. twice daily MME/day:10mg /day.   Monitoring: Pharmacotherapy: No side-effects or adverse reactions reported. [place] PMP: PDMP reviewed during this encounter.       Compliance: No problems identified. Effectiveness: Clinically  acceptable. Plan: Refer to "POC".  UDS:  Summary  Date Value Ref Range Status  04/06/2018 FINAL  Final    Comment:    ==================================================================== TOXASSURE SELECT 13 (MW) ==================================================================== Test                             Result       Flag       Units Drug Present and Declared for Prescription Verification   Norhydrocodone                 228          EXPECTED   ng/mg creat    Norhydrocodone is an expected metabolite of hydrocodone. Drug Absent but Declared for Prescription Verification   Hydrocodone                    Not Detected UNEXPECTED ng/mg creat    Hydrocodone is almost always present in patients taking this drug    consistently. Absence of hydrocodone could be due to lapse of    time since the last dose or unusual pharmacokinetics (rapid    metabolism). ==================================================================== Test                      Result    Flag   Units      Ref Range   Creatinine              60               mg/dL      >=20 ==================================================================== Declared Medications:  The flagging and  interpretation on this report are based on the  following declared medications.  Unexpected results may arise from  inaccuracies in the declared medications.  **Note: The testing scope of this panel includes these medications:  Hydrocodone (Norco)  **Note: The testing scope of this panel does not include following  reported medications:  Acetaminophen (Norco)  Atorvastatin (Lipitor)  Cyanocobalamin  Fluoxetine (Prozac)  Methocarbamol (Robaxin)  Potassium  Supplement  Trazodone (Desyrel) ==================================================================== For clinical consultation, please call [phone]. ====================================================================    Laboratory Chemistry Profile (12 mo)  Renal: No  results found for requested labs within last 8760 hours.  Lab Results  Component Value Date   GFR 76.93 05/20/2017   GFRAA 93 09/08/2017   GFRNONAA 81 09/08/2017   Hepatic: No results found for requested labs within last 8760 hours. Lab Results  Component Value Date   AST 18 09/08/2017   ALT 12 (L) 01/30/2017   Other: No results found for requested labs within last 8760 hours.  Note: Above Lab results reviewed.  Imaging  DG PAIN CLINIC C-ARM 1-60 MIN NO REPORT Fluoro was used, but no Radiologist interpretation will be provided.  Please refer to "NOTES" tab for provider progress note.   Assessment  The primary encounter diagnosis was Chronic pain syndrome. Diagnoses of Chronic low back pain (Primary Area of Pain) (Bilateral) (R>L), Cervicalgia, Pain due to any device, implant or graft (SCS battery) (Left PSIS area), Lumbar facet syndrome (Bilateral) (L>R), and Lumbar facet hypertrophy (Bilateral) were also pertinent to this visit.  Plan of Care  Problem-specific:  No problem-specific Assessment & Plan notes found for this encounter.  I am having Ngoc L. Caicedo start on HYDROcodone-acetaminophen, HYDROcodone-acetaminophen, and lidocaine. I am also having her maintain her (Flaxseed, Linseed, (FLAX SEED OIL PO)), Biotin, methocarbamol, FLUoxetine, traZODone, and HYDROcodone-acetaminophen.  Pharmacotherapy (Medications Ordered): Meds ordered this encounter  Medications  . HYDROcodone-acetaminophen (NORCO/VICODIN) 5-325 MG tablet    Sig: Take 1-2 tablets by mouth at bedtime. Must last 30 days.    Dispense:  60 tablet    Refill:  0    Chronic Pain: STOP Act (Not applicable) Fill 1 day early if closed on refill date. Do not fill until: 12/19/2019. To last until: 01/18/2020. Avoid benzodiazepines within 8 hours of opioids  . HYDROcodone-acetaminophen (NORCO/VICODIN) 5-325 MG tablet    Sig: Take 1-2 tablets by mouth at bedtime. Must last 30 days.    Dispense:  60 tablet    Refill:   0    Chronic Pain: STOP Act (Not applicable) Fill 1 day early if closed on refill date. Do not fill until: 01/18/2020. To last until: 02/17/2020. Avoid benzodiazepines within 8 hours of opioids  . HYDROcodone-acetaminophen (NORCO/VICODIN) 5-325 MG tablet    Sig: Take 1-2 tablets by mouth at bedtime. Must last 30 days.    Dispense:  60 tablet    Refill:  0    Chronic Pain: STOP Act (Not applicable) Fill 1 day early if closed on refill date. Do not fill until: 02/17/2020. To last until: 03/18/2020. Avoid benzodiazepines within 8 hours of opioids  . lidocaine (XYLOCAINE) 5 % ointment    Sig: Apply 1 application topically daily as needed for moderate pain. Wash hands thoroughly after applying    Dispense:  150 g    Refill:  PRN    Fill one day early if pharmacy is closed on scheduled refill date. May substitute for generic if available.   Orders:  Orders Placed This Encounter  Procedures  . LUMBAR  FACET(MEDIAL BRANCH NERVE BLOCK) MBNB    Standing Status:   Future    Standing Expiration Date:   12/25/2019    Scheduling Instructions:     Procedure: Lumbar facet block (AKA.: Lumbosacral medial branch nerve block)     Side: Bilateral     Level: L3-4, L4-5, & L5-S1 Facets (L2, L3, L4, L5, & S1 Medial Branch Nerves)     Sedation: Patient's choice.     Timeframe: ASAA    Order Specific Question:   Where will this procedure be performed?    Answer:   [facility]   Follow-up plan:   Return in about 16 weeks (around 03/15/2020) for (VV), (MM), in addition, Procedure (w/ sedation): (B) L-FCT BLK #3.      Considering:   Possible revision of spinal cord stimulator battery implant to reposition the battery deeper. Possible bilateral lumbar facet RFA #1 Possible bilateral SI joint RFA #1 Diagnostic bilateral IA hip joint injection Diagnostic bilateral femoral + obturator NB Possible bilateral femoral + obturator nerve RFA Diagnostic right L1-2LESI Diagnostic  [id number] Diagnostic right CESI Possible bilateral occipital nerve RFA #1 Diagnostic bilateral IA shoulder joint injection Diagnostic bilateral suprascapular NB Possible bilateral suprascapular nerve RFA   Palliative PRN treatment(s):   Diagnostic bilateral GONB #2 Palliative bilateral cervical facet blocks #5 Palliative bilateral cervical facet RFA #2  Palliative right L2-3 interlaminar LESI #3  Palliative right L1 TFESI #2 Bilateral lumbar spinal cord stimulator trial (done)  Diagnostic bilateral lumbar facet block #3  Diagnostic bilateral SI joint block #3      Recent Visits Date Type Provider Dept  09/08/19 Telemedicine Milinda Pointer, MD [facility]  09/01/19 Telemedicine Milinda Pointer, MD [facility]  Showing recent visits within past 90 days and meeting all other requirements   Today's Visits Date Type Provider Dept  11/24/19 Telemedicine Milinda Pointer, MD [facility]  Showing today's visits and meeting all other requirements   Future Appointments No visits were found meeting these conditions.  Showing future appointments within next 90 days and meeting all other requirements   I discussed the assessment and treatment plan with the patient. The patient was provided an opportunity to ask questions and all were answered. The patient agreed with the plan and demonstrated an understanding of the instructions.  Patient advised to call back or seek an in-person evaluation if the symptoms or condition worsens.  Duration of encounter: 18 minutes.  Note by: Gaspar Cola, MD Date: 11/24/2019; Time: 9:45 AM

## 2019-11-24 ENCOUNTER — Other Ambulatory Visit: Payer: Self-pay

## 2019-11-24 ENCOUNTER — Ambulatory Visit: Payer: Medicare PPO | Attending: Pain Medicine | Admitting: Pain Medicine

## 2019-11-24 DIAGNOSIS — M5442 Lumbago with sciatica, left side: Secondary | ICD-10-CM

## 2019-11-24 DIAGNOSIS — M47816 Spondylosis without myelopathy or radiculopathy, lumbar region: Secondary | ICD-10-CM

## 2019-11-24 DIAGNOSIS — G894 Chronic pain syndrome: Secondary | ICD-10-CM | POA: Diagnosis not present

## 2019-11-24 DIAGNOSIS — G8929 Other chronic pain: Secondary | ICD-10-CM

## 2019-11-24 DIAGNOSIS — M542 Cervicalgia: Secondary | ICD-10-CM | POA: Diagnosis not present

## 2019-11-24 DIAGNOSIS — T85848S Pain due to other internal prosthetic devices, implants and grafts, sequela: Secondary | ICD-10-CM

## 2019-11-24 DIAGNOSIS — M545 Low back pain: Secondary | ICD-10-CM

## 2019-11-24 MED ORDER — HYDROCODONE-ACETAMINOPHEN 5-325 MG PO TABS: 1.0000 | ORAL_TABLET | Freq: Every day | ORAL | 0 refills | Status: DC

## 2019-11-24 MED ORDER — LIDOCAINE 5 % EX OINT: 1.0000 "application " | TOPICAL_OINTMENT | Freq: Every day | CUTANEOUS | 99 refills | Status: DC | PRN

## 2019-11-24 NOTE — Patient Instructions (Signed)

## 2019-11-30 ENCOUNTER — Other Ambulatory Visit: Payer: Self-pay

## 2019-11-30 ENCOUNTER — Ambulatory Visit
Admission: RE | Admit: 2019-11-30 | Discharge: 2019-11-30 | Disposition: A | Discharge status: Home / Self Care | Payer: Medicare PPO | Source: Ambulatory Visit | Attending: Pain Medicine | Admitting: Pain Medicine

## 2019-11-30 ENCOUNTER — Ambulatory Visit (HOSPITAL_BASED_OUTPATIENT_CLINIC_OR_DEPARTMENT_OTHER): Payer: Medicare PPO | Admitting: Pain Medicine

## 2019-11-30 ENCOUNTER — Encounter: Payer: Self-pay | Admitting: Pain Medicine

## 2019-11-30 VITALS — BP 127/66 | HR 74 | Temp 97.8°F | Resp 18 | Ht 60.0 in | Wt 135.0 lb

## 2019-11-30 DIAGNOSIS — M5441 Lumbago with sciatica, right side: Secondary | ICD-10-CM | POA: Insufficient documentation

## 2019-11-30 DIAGNOSIS — M5137 Other intervertebral disc degeneration, lumbosacral region: Secondary | ICD-10-CM

## 2019-11-30 DIAGNOSIS — G8929 Other chronic pain: Secondary | ICD-10-CM | POA: Diagnosis present

## 2019-11-30 DIAGNOSIS — M5442 Lumbago with sciatica, left side: Secondary | ICD-10-CM

## 2019-11-30 DIAGNOSIS — M47816 Spondylosis without myelopathy or radiculopathy, lumbar region: Secondary | ICD-10-CM | POA: Diagnosis present

## 2019-11-30 DIAGNOSIS — M47817 Spondylosis without myelopathy or radiculopathy, lumbosacral region: Secondary | ICD-10-CM | POA: Diagnosis present

## 2019-11-30 MED ORDER — LIDOCAINE HCL 2 % IJ SOLN
20.0000 mL | Freq: Once | INTRAMUSCULAR | Status: AC
  Administered 2019-11-30: 14:00:00 400 mg
  Filled 2019-11-30: qty 40

## 2019-11-30 MED ORDER — FENTANYL CITRATE (PF) 100 MCG/2ML IJ SOLN
25.0000 ug | INTRAMUSCULAR | Status: AC | PRN
  Administered 2019-11-30: 14:00:00 50 ug via INTRAVENOUS
  Filled 2019-11-30: qty 2

## 2019-11-30 MED ORDER — ROPIVACAINE HCL 2 MG/ML IJ SOLN
18.0000 mL | Freq: Once | INTRAMUSCULAR | Status: AC
  Administered 2019-11-30: 18 mL via PERINEURAL
  Filled 2019-11-30: qty 20

## 2019-11-30 MED ORDER — MIDAZOLAM HCL 5 MG/5ML IJ SOLN
1.0000 mg | INTRAMUSCULAR | Status: AC | PRN
  Administered 2019-11-30: 14:00:00 1 mg via INTRAVENOUS
  Filled 2019-11-30: qty 5

## 2019-11-30 MED ORDER — TRIAMCINOLONE ACETONIDE 40 MG/ML IJ SUSP
80.0000 mg | Freq: Once | INTRAMUSCULAR | Status: AC
  Administered 2019-11-30: 80 mg
  Filled 2019-11-30: qty 2

## 2019-11-30 MED ORDER — LACTATED RINGERS IV SOLN
1000.0000 mL | Freq: Once | INTRAVENOUS | Status: AC
  Administered 2019-11-30: 14:00:00 1000 mL via INTRAVENOUS

## 2019-11-30 NOTE — Progress Notes (Signed)
PROVIDER NOTE: Information contained herein reflects review and annotations entered in association with encounter. Interpretation of such information and data should be left to medically-trained personnel. Information provided to patient can be located elsewhere in the medical record under "Patient Instructions". Document created using STT-dictation technology, any transcriptional errors that may result from process are unintentional.    Patient: Karen Edwards  Service Category: Procedure  Provider: Gaspar Cola, MD  DOB: 1966/07/04  DOS: 11/30/2019  Location: [facility]  MRN: [id number]  Setting: Ambulatory - outpatient  Referring Provider: Milinda Pointer, MD  Type: Established Patient  Specialty: Interventional Pain Management  PCP: Pleas Koch, NP   Primary Reason for Visit: Interventional Pain Management Treatment. CC: Back Pain (Radiating down left leg)  Procedure:          Anesthesia, Analgesia, Anxiolysis:  Type: Lumbar Facet, Medial Branch Block(s)          Primary Purpose: Diagnostic Region: Posterolateral Lumbosacral Spine Level: L2, L3, L4, L5, & S1 Medial Branch Level(s). Injecting these levels blocks the L3-4, L4-5, and L5-S1 lumbar facet joints. Laterality: Bilateral  Type: Moderate (Conscious) Sedation combined with Local Anesthesia Indication(s): Analgesia and Anxiety Route: Intravenous (IV) IV Access: Secured Sedation: Meaningful verbal contact was maintained at all times during the procedure  Local Anesthetic: Lidocaine 1-2%  Position: Prone   Indications: 1. 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 Spondylosis without myelopathy or radiculopathy, lumbosacral region   3. DDD (degenerative disc disease), lumbosacral   4. Lumbar facet hypertrophy (Bilateral)   5. Chronic low back pain (Primary Area of Pain) (Bilateral) (R>L)    Pain Score: Pre-procedure: 5 /10 Post-procedure: 1 /10   Pre-op Assessment:  Karen Edwards is a 54  y.o. (year old), female patient, seen today for interventional treatment. She  has a past surgical history that includes Cesarean section; Ankle surgery (Right, 03/02/2014); Hemorrhoid surgery (N/A, 06/16/2015); Cervical spine surgery (02/21/2016); Cesarean section with bilateral tubal ligation (1990); Colonoscopy (04/19/2015); Hysteroscopy with D & C (12/24/2013); Colonoscopy with propofol (N/A, 05/18/2018); Esophagogastroduodenoscopy (egd) with propofol (N/A, 05/18/2018); and Spinal cord stimulator insertion (N/A, 06/18/2018). Karen Edwards has a current medication list which includes the following prescription(s): biotin, flaxseed (linseed), fluoxetine, [START ON 12/19/2019] hydrocodone-acetaminophen, [START ON 01/18/2020] hydrocodone-acetaminophen, [START ON 02/17/2020] hydrocodone-acetaminophen, lidocaine, methocarbamol, and trazodone. Her primarily concern today is the Back Pain (Radiating down left leg)  Initial Vital Signs:  Pulse/HCG Rate: 74ECG Heart Rate: 63 Temp: 97.8 F (36.6 C) Resp: 18 BP: 127/70 SpO2: 99 %  BMI: Estimated body mass index is 26.37 kg/m as calculated from the following:   Height as of this encounter: 5' (1.524 m).   Weight as of this encounter: 135 lb (61.2 kg).  Risk Assessment: Allergies: Reviewed. She has No Known Allergies.  Allergy Precautions: None required Coagulopathies: Reviewed. None identified.  Blood-thinner therapy: None at this time Active Infection(s): Reviewed. None identified. Karen Edwards is afebrile  Site Confirmation: Karen Edwards was asked to confirm the procedure and laterality before marking the site Procedure checklist: Completed Consent: Before the procedure and under the influence of no sedative(s), amnesic(s), or anxiolytics, the patient was informed of the treatment options, risks and possible complications. To fulfill our ethical and legal obligations, as recommended by the American Medical Association's Code of Ethics, I have informed the  patient of my clinical impression; the nature and purpose of the treatment or procedure; the risks, benefits, and possible complications of the intervention; the alternatives, including doing nothing; the  risk(s) and benefit(s) of the alternative treatment(s) or procedure(s); and the risk(s) and benefit(s) of doing nothing. The patient was provided information about the general risks and possible complications associated with the procedure. These may include, but are not limited to: failure to achieve desired goals, infection, bleeding, organ or nerve damage, allergic reactions, paralysis, and death. In addition, the patient was informed of those risks and complications associated to Spine-related procedures, such as failure to decrease pain; infection (i.e.: Meningitis, epidural or intraspinal abscess); bleeding (i.e.: epidural hematoma, subarachnoid hemorrhage, or any other type of intraspinal or peri-dural bleeding); organ or nerve damage (i.e.: Any type of peripheral nerve, nerve root, or spinal cord injury) with subsequent damage to sensory, motor, and/or autonomic systems, resulting in permanent pain, numbness, and/or weakness of one or several areas of the body; allergic reactions; (i.e.: anaphylactic reaction); and/or death. Furthermore, the patient was informed of those risks and complications associated with the medications. These include, but are not limited to: allergic reactions (i.e.: anaphylactic or anaphylactoid reaction(s)); adrenal axis suppression; blood sugar elevation that in diabetics may result in ketoacidosis or comma; water retention that in patients with history of congestive heart failure may result in shortness of breath, pulmonary edema, and decompensation with resultant heart failure; weight gain; swelling or edema; medication-induced neural toxicity; particulate matter embolism and blood vessel occlusion with resultant organ, and/or nervous system infarction; and/or aseptic necrosis  of one or more joints. Finally, the patient was informed that Medicine is not an exact science; therefore, there is also the possibility of unforeseen or unpredictable risks and/or possible complications that may result in a catastrophic outcome. The patient indicated having understood very clearly. We have given the patient no guarantees and we have made no promises. Enough time was given to the patient to ask questions, all of which were answered to the patient's satisfaction. Ms. Munyan has indicated that she wanted to continue with the procedure. Attestation: I, the ordering provider, attest that I have discussed with the patient the benefits, risks, side-effects, alternatives, likelihood of achieving goals, and potential problems during recovery for the procedure that I have provided informed consent. Date  Time: 11/30/2019  1:06 PM  Pre-Procedure Preparation:  Monitoring: As per clinic protocol. Respiration, ETCO2, SpO2, BP, heart rate and rhythm monitor placed and checked for adequate function Safety Precautions: Patient was assessed for positional comfort and pressure points before starting the procedure. Time-out: I initiated and conducted the "Time-out" before starting the procedure, as per protocol. The patient was asked to participate by confirming the accuracy of the "Time Out" information. Verification of the correct person, site, and procedure were performed and confirmed by me, the nursing staff, and the patient. "Time-out" conducted as per Joint Commission's Universal Protocol ([id number].01.01.01). Time: 1346  Description of Procedure:          Laterality: Bilateral. The procedure was performed in identical fashion on both sides. Levels:  L2, L3, L4, L5, & S1 Medial Branch Level(s) Area Prepped: Posterior Lumbosacral Region Prepping solution: DuraPrep (Iodine Povacrylex [0.7% available iodine] and Isopropyl Alcohol, 74% w/w) Safety Precautions: Aspiration looking for blood return was  conducted prior to all injections. At no point did we inject any substances, as a needle was being advanced. Before injecting, the patient was told to immediately notify me if she was experiencing any new onset of "ringing in the ears, or metallic taste in the mouth". No attempts were made at seeking any paresthesias. Safe injection practices and needle disposal techniques used. Medications properly  checked for expiration dates. SDV (single dose vial) medications used. After the completion of the procedure, all disposable equipment used was discarded in the proper designated medical waste containers. Local Anesthesia: Protocol guidelines were followed. The patient was positioned over the fluoroscopy table. The area was prepped in the usual manner. The time-out was completed. The target area was identified using fluoroscopy. A 12-in long, straight, sterile hemostat was used with fluoroscopic guidance to locate the targets for each level blocked. Once located, the skin was marked with an approved surgical skin marker. Once all sites were marked, the skin (epidermis, dermis, and hypodermis), as well as deeper tissues (fat, connective tissue and muscle) were infiltrated with a small amount of a short-acting local anesthetic, loaded on a 10cc syringe with a 25G, 1.5-in  Needle. An appropriate amount of time was allowed for local anesthetics to take effect before proceeding to the next step. Local Anesthetic: Lidocaine 2.0% The unused portion of the local anesthetic was discarded in the proper designated containers. Technical explanation of process:  L2 Medial Branch Nerve Block (MBB): The target area for the L2 medial branch is at the junction of the postero-lateral aspect of the superior articular process and the superior, posterior, and medial edge of the transverse process of L3. Under fluoroscopic guidance, a Quincke needle was inserted until contact was made with os over the superior postero-lateral aspect of  the pedicular shadow (target area). After negative aspiration for blood, 0.5 mL of the nerve block solution was injected without difficulty or complication. The needle was removed intact. L3 Medial Branch Nerve Block (MBB): The target area for the L3 medial branch is at the junction of the postero-lateral aspect of the superior articular process and the superior, posterior, and medial edge of the transverse process of L4. Under fluoroscopic guidance, a Quincke needle was inserted until contact was made with os over the superior postero-lateral aspect of the pedicular shadow (target area). After negative aspiration for blood, 0.5 mL of the nerve block solution was injected without difficulty or complication. The needle was removed intact. L4 Medial Branch Nerve Block (MBB): The target area for the L4 medial branch is at the junction of the postero-lateral aspect of the superior articular process and the superior, posterior, and medial edge of the transverse process of L5. Under fluoroscopic guidance, a Quincke needle was inserted until contact was made with os over the superior postero-lateral aspect of the pedicular shadow (target area). After negative aspiration for blood, 0.5 mL of the nerve block solution was injected without difficulty or complication. The needle was removed intact. L5 Medial Branch Nerve Block (MBB): The target area for the L5 medial branch is at the junction of the postero-lateral aspect of the superior articular process and the superior, posterior, and medial edge of the sacral ala. Under fluoroscopic guidance, a Quincke needle was inserted until contact was made with os over the superior postero-lateral aspect of the pedicular shadow (target area). After negative aspiration for blood, 0.5 mL of the nerve block solution was injected without difficulty or complication. The needle was removed intact. S1 Medial Branch Nerve Block (MBB): The target area for the S1 medial branch is at the  posterior and inferior 6 o'clock position of the L5-S1 facet joint. Under fluoroscopic guidance, the Quincke needle inserted for the L5 MBB was redirected until contact was made with os over the inferior and postero aspect of the sacrum, at the 6 o' clock position under the L5-S1 facet joint (Target area). After  negative aspiration for blood, 0.5 mL of the nerve block solution was injected without difficulty or complication. The needle was removed intact.  Nerve block solution: 0.2% PF-Ropivacaine + Triamcinolone (40 mg/mL) diluted to a final concentration of 4 mg of Triamcinolone/mL of Ropivacaine The unused portion of the solution was discarded in the proper designated containers. Procedural Needles: 22-gauge, 3.5-inch, Quincke needles used for all levels.  Once the entire procedure was completed, the treated area was cleaned, making sure to leave some of the prepping solution back to take advantage of its long term bactericidal properties.   Illustration of the posterior view of the lumbar spine and the posterior neural structures. Laminae of L2 through S1 are labeled. DPRL5, dorsal primary ramus of L5; DPRS1, dorsal primary ramus of S1; DPR3, dorsal primary ramus of L3; FJ, facet (zygapophyseal) joint L3-L4; I, inferior articular process of L4; LB1, lateral branch of dorsal primary ramus of L1; IAB, inferior articular branches from L3 medial branch (supplies L4-L5 facet joint); IBP, intermediate branch plexus; MB3, medial branch of dorsal primary ramus of L3; NR3, third lumbar nerve root; S, superior articular process of L5; SAB, superior articular branches from L4 (supplies L4-5 facet joint also); TP3, transverse process of L3.  Vitals:   11/30/19 1358 11/30/19 1402 11/30/19 1412 11/30/19 1421  BP: (!) 104/91 (!) 126/59 (!) 126/59 127/66  Pulse:      Resp: 12 16 16 18   Temp:      SpO2: 95% 97% 98% 99%  Weight:      Height:         Start Time: 1346 hrs. End Time: 1356 hrs.  Imaging  Guidance (Spinal):          Type of Imaging Technique: Fluoroscopy Guidance (Spinal) Indication(s): Assistance in needle guidance and placement for procedures requiring needle placement in or near specific anatomical locations not easily accessible without such assistance. Exposure Time: Please see nurses notes. Contrast: None used. Fluoroscopic Guidance: I was personally present during the use of fluoroscopy. "Tunnel Vision Technique" used to obtain the best possible view of the target area. Parallax error corrected before commencing the procedure. "Direction-depth-direction" technique used to introduce the needle under continuous pulsed fluoroscopy. Once target was reached, antero-posterior, oblique, and lateral fluoroscopic projection used confirm needle placement in all planes. Images permanently stored in EMR. Interpretation: No contrast injected. I personally interpreted the imaging intraoperatively. Adequate needle placement confirmed in multiple planes. Permanent images saved into the patient's record.  Antibiotic Prophylaxis:   Anti-infectives (From admission, onward)   None     Indication(s): None identified  Post-operative Assessment:  Post-procedure Vital Signs:  Pulse/HCG Rate: 7462 Temp: 97.8 F (36.6 C) Resp: 18 BP: 127/66 SpO2: 99 %  EBL: None  Complications: No immediate post-treatment complications observed by team, or reported by patient.  Note: The patient tolerated the entire procedure well. A repeat set of vitals were taken after the procedure and the patient was kept under observation following institutional policy, for this type of procedure. Post-procedural neurological assessment was performed, showing return to baseline, prior to discharge. The patient was provided with post-procedure discharge instructions, including a section on how to identify potential problems. Should any problems arise concerning this procedure, the patient was given instructions to  immediately contact us, at any time, without hesitation. In any case, we plan to contact the patient by telephone for a follow-up status report regarding this interventional procedure.  Comments:  No additional relevant information.  Plan of Care  Orders:  Orders Placed This Encounter  Procedures  . LUMBAR FACET(MEDIAL BRANCH NERVE BLOCK) MBNB    Scheduling Instructions:     Procedure: Lumbar facet block (AKA.: Lumbosacral medial branch nerve block)     Side: Bilateral     Level: L3-4, L4-5, & L5-S1 Facets (L2, L3, L4, L5, & S1 Medial Branch Nerves)     Sedation: Patient's choice.     Timeframe: Today    Order Specific Question:   Where will this procedure be performed?    Answer:   [facility]  . [facility] C-ARM 1-60 MIN NO REPORT    Intraoperative interpretation by procedural physician at [place].    Standing Status:   Standing    Number of Occurrences:   1    Order Specific Question:   Reason for exam:    Answer:   Assistance in needle guidance and placement for procedures requiring needle placement in or near specific anatomical locations not easily accessible without such assistance.  . Informed Consent Details: Physician/Practitioner Attestation; Transcribe to consent form and obtain patient signature    Nursing Order: Transcribe to consent form and obtain patient signature. Note: Always confirm laterality of pain with Ms. Tye Savoy, before procedure. Procedure: Lumbar Facet Block  under fluoroscopic guidance Indication/Reason: Low Back Pain, with our without leg pain, due to Facet Joint Arthralgia (Joint Pain) known as Lumbar Facet Syndrome, secondary to Lumbar, and/or Lumbosacral Spondylosis (Arthritis of the Spine), without myelopathy or radiculopathy (Nerve Damage). Provider Attestation: I, Oxford Dossie Arbour, MD, (Pain Management Specialist), the physician/practitioner, attest that I have discussed with the patient the benefits, risks, side  effects, alternatives, likelihood of achieving goals and potential problems during recovery for the procedure that I have provided informed consent.  . Provide equipment / supplies at bedside    Equipment required: Single use, disposable, "Block Tray"    Standing Status:   Standing    Number of Occurrences:   1    Order Specific Question:   Specify    Answer:   Block Tray   Chronic Opioid Analgesic:  Hydrocodone/APAP 5/325 1 tablet p.o. twice daily MME/day:10mg /day.   Medications ordered for procedure: Meds ordered this encounter  Medications  . ropivacaine (PF) 2 mg/mL (0.2%) (NAROPIN) injection 18 mL  . triamcinolone acetonide (KENALOG-40) injection 80 mg  . lidocaine (XYLOCAINE) 2 % (with pres) injection 400 mg  . lactated ringers infusion 1,000 mL  . midazolam (VERSED) 5 MG/5ML injection 1-2 mg    Make sure Flumazenil is available in the pyxis when using this medication. If oversedation occurs, administer 0.2 mg IV over 15 sec. If after 45 sec no response, administer 0.2 mg again over 1 min; may repeat at 1 min intervals; not to exceed 4 doses (1 mg)  . fentaNYL (SUBLIMAZE) injection 25-50 mcg    Make sure Narcan is available in the pyxis when using this medication. In the event of respiratory depression (RR< 8/min): Titrate NARCAN (naloxone) in increments of 0.1 to 0.2 mg IV at 2-3 minute intervals, until desired degree of reversal.   Medications administered: We administered ropivacaine (PF) 2 mg/mL (0.2%), triamcinolone acetonide, lidocaine, lactated ringers, midazolam, and fentaNYL.  See the medical record for exact dosing, route, and time of administration.  Follow-up plan:   Return in about 2 weeks (around 12/14/2019) for (VV), (PP).       Considering:   Possible revision of spinal cord stimulator battery implant to reposition the battery deeper. Possible bilateral lumbar facet  RFA #1 Possible bilateral SI joint RFA #1 Diagnostic bilateral IA hip joint  injection Diagnostic bilateral femoral + obturator NB Possible bilateral femoral + obturator nerve RFA Diagnostic right L1-2LESI Diagnostic [id number] Diagnostic right CESI Possible bilateral occipital nerve RFA #1 Diagnostic bilateral IA shoulder joint injection Diagnostic bilateral suprascapular NB Possible bilateral suprascapular nerve RFA   Palliative PRN treatment(s):   Diagnostic bilateral GONB #2 Palliative bilateral cervical facet blocks #5 Palliative bilateral cervical facet RFA #2  Palliative right L2-3 interlaminar LESI #3  Palliative right L1 TFESI #2 Bilateral lumbar spinal cord stimulator trial (done)  Diagnostic bilateral lumbar facet block #3  Diagnostic bilateral SI joint block #3       Recent Visits Date Type Provider Dept  11/30/19 Procedure visit Milinda Pointer, MD [facility]  11/24/19 Telemedicine Milinda Pointer, MD [facility]  09/08/19 Telemedicine Milinda Pointer, MD [facility]  Showing recent visits within past 90 days and meeting all other requirements   Future Appointments Date Type Provider Dept  12/14/19 Appointment Milinda Pointer, MD [facility]  Showing future appointments within next 90 days and meeting all other requirements   Disposition: Discharge home  Discharge (Date  Time): 11/30/2019; 1423 hrs.   Primary Care Physician: Pleas Koch, NP Location: [facility] Note by: Gaspar Cola, MD Date: 11/30/2019; Time: 4:13 AM  Disclaimer:  Medicine is not an Chief Strategy Officer. The only guarantee in medicine is that nothing is guaranteed. It is important to note that the decision to proceed with this intervention was based on the information collected from the patient. The Data and conclusions were drawn from the patient's questionnaire, the interview, and the physical examination. Because the information was provided in large part by  the patient, it cannot be guaranteed that it has not been purposely or unconsciously manipulated. Every effort has been made to obtain as much relevant data as possible for this evaluation. It is important to note that the conclusions that lead to this procedure are derived in large part from the available data. Always take into account that the treatment will also be dependent on availability of resources and existing treatment guidelines, considered by other Pain Management Practitioners as being common knowledge and practice, at the time of the intervention. For Medico-Legal purposes, it is also important to point out that variation in procedural techniques and pharmacological choices are the acceptable norm. The indications, contraindications, technique, and results of the above procedure should only be interpreted and judged by a Board-Certified Interventional Pain Specialist with extensive familiarity and expertise in the same exact procedure and technique.

## 2019-11-30 NOTE — Patient Instructions (Signed)

## 2019-12-01 ENCOUNTER — Telehealth: Payer: Self-pay

## 2019-12-01 NOTE — Telephone Encounter (Signed)
Post procedure phone call.  Patient states she is doing well, a little sore.  Informed her to put heat on it today.

## 2019-12-13 ENCOUNTER — Encounter: Payer: Self-pay | Admitting: Pain Medicine

## 2019-12-13 NOTE — Progress Notes (Signed)
Pain relief after procedure (treated area only): (Questions asked to patient) 1. Starting about 15 minutes after the procedure, and "while the area was still numb" (from the local anesthetics), were you having any of your usual pain "in that area" (the treated area)?  (NOTE: NOT including the discomfort from the needle sticks.) First 1 hour: 100 % better. First 4-6 hours: 100 % better. 2. How long did the numbness from the local anesthetics last? (More than 6 hours?) Duration: 6 hours.  3. How much better is your pain now, when compared to before the procedure? Current benefit: 20 % better. 4. Can you move better now? Improvement in ROM (Range of Motion): No 5. Can you do more now? Improvement in function: No 4. Did you have any problems with the procedure? Side-effects/Complications: No

## 2019-12-14 ENCOUNTER — Ambulatory Visit: Payer: Medicare PPO | Attending: Pain Medicine | Admitting: Pain Medicine

## 2019-12-14 ENCOUNTER — Other Ambulatory Visit: Payer: Self-pay

## 2019-12-14 VITALS — Ht 60.0 in | Wt 135.0 lb

## 2019-12-14 DIAGNOSIS — T85848S Pain due to other internal prosthetic devices, implants and grafts, sequela: Secondary | ICD-10-CM

## 2019-12-14 DIAGNOSIS — G894 Chronic pain syndrome: Secondary | ICD-10-CM

## 2019-12-14 DIAGNOSIS — M5442 Lumbago with sciatica, left side: Secondary | ICD-10-CM | POA: Diagnosis not present

## 2019-12-14 DIAGNOSIS — M5137 Other intervertebral disc degeneration, lumbosacral region: Secondary | ICD-10-CM | POA: Diagnosis not present

## 2019-12-14 DIAGNOSIS — M47816 Spondylosis without myelopathy or radiculopathy, lumbar region: Secondary | ICD-10-CM | POA: Diagnosis not present

## 2019-12-14 DIAGNOSIS — M4802 Spinal stenosis, cervical region: Secondary | ICD-10-CM

## 2019-12-14 DIAGNOSIS — G8929 Other chronic pain: Secondary | ICD-10-CM

## 2019-12-14 DIAGNOSIS — M47817 Spondylosis without myelopathy or radiculopathy, lumbosacral region: Secondary | ICD-10-CM

## 2019-12-14 DIAGNOSIS — M5030 Other cervical disc degeneration, unspecified cervical region: Secondary | ICD-10-CM

## 2019-12-14 DIAGNOSIS — Z9889 Other specified postprocedural states: Secondary | ICD-10-CM

## 2019-12-14 DIAGNOSIS — M542 Cervicalgia: Secondary | ICD-10-CM

## 2019-12-14 DIAGNOSIS — M5412 Radiculopathy, cervical region: Secondary | ICD-10-CM

## 2019-12-14 DIAGNOSIS — M5441 Lumbago with sciatica, right side: Secondary | ICD-10-CM

## 2019-12-14 MED ORDER — OXYCODONE HCL 5 MG PO TABS: 5.0000 mg | ORAL_TABLET | Freq: Two times a day (BID) | ORAL | 0 refills | Status: DC | PRN

## 2019-12-14 NOTE — Patient Instructions (Signed)

## 2019-12-14 NOTE — Progress Notes (Signed)
Patient: Karen Edwards  Service Category: E/M  Provider: Gaspar Cola, MD  DOB: Aug 17, 1966  DOS: 12/14/2019  Location: Office  MRN: [id number]  Setting: Ambulatory outpatient  Referring Provider: Pleas Koch, NP  Type: Established Patient  Specialty: Interventional Pain Management  PCP: Pleas Koch, NP  Location: Remote location  Delivery: TeleHealth     Virtual Encounter - Pain Management PROVIDER NOTE: Information contained herein reflects review and annotations entered in association with encounter. Interpretation of such information and data should be left to medically-trained personnel. Information provided to patient can be located elsewhere in the medical record under "Patient Instructions". Document created using STT-dictation technology, any transcriptional errors that may result from process are unintentional.    Contact & Pharmacy Preferred: [phone] Home: [phone] (home) Mobile: [phone] (mobile) E-mail: [email or web address]  [facility] [place] [address] [place] [address] [place] [address] Phone: [phone] Fax: [phone]   Pre-screening  Karen Edwards offered "in-person" vs "virtual" encounter. She indicated preferring virtual for this encounter.   Reason COVID-19*  Social distancing based on CDC and AMA recommendations.   I contacted Karen Edwards on 12/14/2019 via telephone.      I clearly identified myself as Gaspar Cola, MD. I verified that I was speaking with the correct person using two identifiers (Name: Karen Edwards, and date of birth: 1965-11-11).  Consent I sought verbal advanced consent from Karen Edwards for virtual visit interactions. I informed Karen Edwards of possible security and privacy concerns, risks, and limitations associated with providing "not-in-person" medical evaluation and management services. I also informed Karen Edwards of the availability  of "in-person" appointments. Finally, I informed her that there would be a charge for the virtual visit and that she could be  personally, fully or partially, financially responsible for it. Karen Edwards expressed understanding and agreed to proceed.   Historic Elements   Karen Edwards is a 54 y.o. year old, female patient evaluated today after her last contact with our practice on 12/01/2019. Karen Edwards  has a past medical history of Anxiety, Chronic back pain, Degenerative joint disease, Depression, Dyspnea, Headache, Hypotension, IBS (irritable bowel syndrome) (05/2015), and MVP (mitral valve prolapse). She also  has a past surgical history that includes Cesarean section; Ankle surgery (Right, 03/02/2014); Hemorrhoid surgery (N/A, 06/16/2015); Cervical spine surgery (02/21/2016); Cesarean section with bilateral tubal ligation (1990); Colonoscopy (04/19/2015); Hysteroscopy with D & C (12/24/2013); Colonoscopy with propofol (N/A, 05/18/2018); Esophagogastroduodenoscopy (egd) with propofol (N/A, 05/18/2018); and Spinal cord stimulator insertion (N/A, 06/18/2018). Karen Edwards has a current medication list which includes the following prescription(s): biotin, flaxseed (linseed), fluoxetine, lidocaine, methocarbamol, trazodone, [START ON 12/19/2019] oxycodone, [START ON 01/18/2020] oxycodone, and [START ON 02/17/2020] oxycodone. She  reports that she has been smoking cigarettes. She has a 18.50 pack-year smoking history. She has never used smokeless tobacco. She reports current alcohol use of about 1.0 standard drinks of alcohol per week. She reports that she does not use drugs. Karen Edwards has No Known Allergies.   HPI  Today, she is being contacted for both, medication management and a post-procedure assessment.  According to the patient she again attained 100% relief with the pain for the duration of the local anesthetic, indicating that this is the etiology of her low back pain.  However, as soon as the  local anesthetic wore off, the relief also went down to only about 20%.  She is  still having some pain in the left lower back as a consequence of the implant being too close to the skin secondary to the scar tissue.  This issue will require that we revise the implant battery so asked to bury it deeper.  However meanwhile, we will go ahead and look at doing radiofrequency of the lumbar facets for the purpose of decreasing this pain.  Today we also had a conversation regarding her hydrocodone since she indicates that its not really working in controlling her pain and she is also concerned about taking acetaminophen on a daily basis.  In the past we had talked about the possibility of switching to oxycodone, which she had tried it before and she indicated that worked much better.  Today we will go ahead and do that.  I have called her pharmacy and I canceled all of her hydrocodone prescriptions and I have sent new prescriptions for the oxycodone which we already know she can tolerate and works better.  In addition to this, the patient also indicates that her neck pain is getting worse and she is having pain going down both upper extremities to the area of her thumb and index finger and what seems to be a C6 dermatomal distribution, bilaterally.  She has a history of a prior surgery Cervical and today she is reporting worsening of her neck pain and arm pain especially when she reaches above her head to do any type of work like hanging her clothing.  She describes that when she does this, she has a lot of cracking in the neck accompanied by pain and the pain and numbness going down her arms.  She is concerned about her cervical radiculitis/radiculopathy coming back.  Her last MRI of the cervical spine have been done in 2019 but she describes that things have been getting a lot worse since then.  Today she has requested that we repeat that MRI, which I agree would be indicated in lieu of the fact that this pain is  getting worse and it does seem to have the C6 dermatomal distribution.  If it had been just the neck pain, then I would have thought it was her facet syndrome returning, but the fact that it is affecting her upper extremities bilaterally, and that the symptoms include pain numbness and occasional weakness in the C6 distribution, I am concerned that there may be an issue affecting her cervical spine in a way similar to when she had her first surgery.  Because of this, I have agreed to order this cervical MRI.  Post-Procedure Evaluation  Procedure (11/30/2019): Diagnostic bilateral lumbar facet block #3 under fluoroscopic guidance and IV sedation Pre-procedure pain level:  5/10 Post-procedure: 1/10 (> 50% relief)  Sedation: Sedation provided.  Chauncey Fischer, RN  12/13/2019 11:19 AM  Sign when Signing Visit Pain relief after procedure (treated area only): (Questions asked to patient) 1. Starting about 15 minutes after the procedure, and "while the area was still numb" (from the local anesthetics), were you having any of your usual pain "in that area" (the treated area)?  (NOTE: NOT including the discomfort from the needle sticks.) First 1 hour: 100 % better. First 4-6 hours: 100 % better. 2. How long did the numbness from the local anesthetics last? (More than 6 hours?) Duration: 6 hours.  3. How much better is your pain now, when compared to before the procedure? Current benefit: 20 % better. 4. Can you move better now? Improvement in ROM (Range of Motion):  No 5. Can you do more now? Improvement in function: No 4. Did you have any problems with the procedure? Side-effects/Complications: No  Current benefits: Defined as benefit that persist at this time.   Analgesia:  100% relief of the pain for the duration of the local anesthetic, which confirmed origin/etiology of pain.  Less than 50% long-term benefit suggesting noninflammatory (more likely mechanical) pain mechanism. Function: Back to  baseline ROM: Back to baseline   Medical Necessity: Ms. Macy has experienced debilitating chronic pain from the Lumbosacral Facet Syndrome (Spondylosis without myelopathy or radiculopathy, lumbosacral region [M47.817]) that has persisted for longer than three months of failed non-surgical care and has either failed to respond, or was unable to tolerate, or simply did not get enough benefit from other more conservative therapies including, but not limited to: 1. Over-the-counter oral analgesic medications (i.e.: ibuprofen, naproxen, etc.) 2. Anti-inflammatory medications 3. Muscle relaxants 4. Membrane stabilizers 5. Opioids 6. Physical therapy (PT), chiropractic manipulation, and/or home exercise program (HEP). 7. Modalities (Heat, ice, etc.) 8. Invasive techniques such as nerve blocks.  Ms. Peavler has attained greater than 50% reduction in pain from at least two (2) diagnostic medial branch blocks conducted in separate occasions. For this reason, I believe it is medically necessary to proceed with Non-Pulsed Radiofrequency Ablation for the purpose of attempting to prolong the duration of the benefits seen with the diagnostic injections.   Pharmacotherapy Assessment  Analgesic: Oxycodone IR 5 mg, 1 tab PO BID (10 mg/day of oxycodone) MME/day:'15mg'$ /day.   Monitoring: [place] PMP: PDMP reviewed during this encounter.       Pharmacotherapy: No side-effects or adverse reactions reported. Compliance: No problems identified. Effectiveness: Clinically acceptable. Plan: Refer to "POC".  UDS:  Summary  Date Value Ref Range Status  04/06/2018 FINAL  Final    Comment:    ==================================================================== TOXASSURE SELECT 13 (MW) ==================================================================== Test                             Result       Flag       Units Drug Present and Declared for Prescription Verification   Norhydrocodone                 228           EXPECTED   ng/mg creat    Norhydrocodone is an expected metabolite of hydrocodone. Drug Absent but Declared for Prescription Verification   Hydrocodone                    Not Detected UNEXPECTED ng/mg creat    Hydrocodone is almost always present in patients taking this drug    consistently. Absence of hydrocodone could be due to lapse of    time since the last dose or unusual pharmacokinetics (rapid    metabolism). ==================================================================== Test                      Result    Flag   Units      Ref Range   Creatinine              60               mg/dL      >=20 ==================================================================== Declared Medications:  The flagging and interpretation on this report are based on the  following declared medications.  Unexpected results may arise from  inaccuracies in the declared medications.  **  Note: The testing scope of this panel includes these medications:  Hydrocodone (Norco)  **Note: The testing scope of this panel does not include following  reported medications:  Acetaminophen (Norco)  Atorvastatin (Lipitor)  Cyanocobalamin  Fluoxetine (Prozac)  Methocarbamol (Robaxin)  Potassium  Supplement  Trazodone (Desyrel) ==================================================================== For clinical consultation, please call [phone]. ====================================================================    Laboratory Chemistry Profile   Renal Lab Results  Component Value Date   BUN 7 09/08/2017   CREATININE 0.84 09/08/2017   BCR 8 (L) 09/08/2017   GFR 76.93 05/20/2017   GFRAA 93 09/08/2017   GFRNONAA 81 09/08/2017    Hepatic Lab Results  Component Value Date   AST 18 09/08/2017   ALT 12 (L) 01/30/2017   ALBUMIN 4.7 09/08/2017   ALKPHOS 73 09/08/2017    Electrolytes Lab Results  Component Value Date   NA 139 09/08/2017   K 4.4 09/08/2017   CL 99 09/08/2017   CALCIUM 9.8  09/08/2017   MG 2.1 09/08/2017    Bone Lab Results  Component Value Date   VD25OH 43.74 01/10/2016   25OHVITD1 51 09/08/2017   25OHVITD2 <1.0 09/08/2017   25OHVITD3 51 09/08/2017    Coagulation Lab Results  Component Value Date   PLT 202.0 05/20/2017    Cardiovascular Lab Results  Component Value Date   TROPONINI <0.03 01/30/2017   HGB 13.5 05/20/2017   HCT 40.5 05/20/2017    Inflammation (CRP: Acute Phase) (ESR: Chronic Phase) Lab Results  Component Value Date   CRP 1.4 09/08/2017   ESRSEDRATE 11 09/08/2017      Note: Above Lab results reviewed.  Imaging  DG PAIN CLINIC C-ARM 1-60 MIN NO REPORT Fluoro was used, but no Radiologist interpretation will be provided.  Please refer to "NOTES" tab for provider progress note.  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 Diagnoses of Lumbar facet hypertrophy (Bilateral), Spondylosis without myelopathy or radiculopathy, lumbosacral region, DDD (degenerative disc disease), lumbosacral, Chronic low back pain (Primary Area of Pain) (Bilateral) (R>L), Chronic pain syndrome, Cervicalgia, Cervical foraminal stenosis (C5-6) (Bilateral), Chronic cervical radicular pain (Right: C6/C7) (Left: C5/T1), DDD (degenerative disc disease), cervical, History of cervical spinal surgery (C5-7 ACDF), and Pain due to any device, implant or graft (SCS battery) (Left PSIS area) were also pertinent to this visit.  Plan of Care  Problem-specific:  No problem-specific Assessment & Plan notes found for this encounter.  I have discontinued Dorothe L. Benison's HYDROcodone-acetaminophen, HYDROcodone-acetaminophen, and HYDROcodone-acetaminophen. I am also having her start on oxyCODONE, oxyCODONE, and oxyCODONE. Additionally, I am having her maintain her (Flaxseed, Linseed, (FLAX SEED OIL PO)), Biotin, methocarbamol, FLUoxetine, traZODone, and lidocaine.  Pharmacotherapy (Medications Ordered): Meds ordered this  encounter  Medications  . oxyCODONE (OXY IR/ROXICODONE) 5 MG immediate release tablet    Sig: Take 1 tablet (5 mg total) by mouth 2 (two) times daily as needed for severe pain. Must last 30 days.    Dispense:  60 tablet    Refill:  0    Chronic Pain: STOP Act (Not applicable) Fill 1 day early if closed on refill date. Do not fill until: 12/19/2019. To last until: 01/18/2020. Avoid benzodiazepines within 8 hours of opioids  . oxyCODONE (OXY IR/ROXICODONE) 5 MG immediate release tablet    Sig: Take 1 tablet (5 mg total) by mouth 2 (two) times daily as needed for severe pain. Must last 30 days.    Dispense:  60 tablet    Refill:  0  Chronic Pain: STOP Act (Not applicable) Fill 1 day early if closed on refill date. Do not fill until: 01/18/2020. To last until: 02/17/2020. Avoid benzodiazepines within 8 hours of opioids  . oxyCODONE (OXY IR/ROXICODONE) 5 MG immediate release tablet    Sig: Take 1 tablet (5 mg total) by mouth 2 (two) times daily as needed for severe pain. Must last 30 days.    Dispense:  60 tablet    Refill:  0    Chronic Pain: STOP Act (Not applicable) Fill 1 day early if closed on refill date. Do not fill until: 02/17/2020. To last until: 03/18/2020. Avoid benzodiazepines within 8 hours of opioids   Orders:  Orders Placed This Encounter  Procedures  . Radiofrequency,Lumbar    Standing Status:   Future    Standing Expiration Date:   06/12/2021    Scheduling Instructions:     Side(s): Left-sided     Level: L3-4, L4-5, & L5-S1 Facets (L2, L3, L4, L5, & S1 Medial Branch Nerves)     Sedation: With Sedation     Scheduling Timeframe: As soon as pre-approved    Order Specific Question:   Where will this procedure be performed?    Answer:   [facility]  . MR CERVICAL SPINE WO CONTRAST    Patient presents with axial pain with possible radicular component.  In addition to any acute findings, please report on:  1. Facet (Zygapophyseal) joint DJD (Hypertrophy, space narrowing,  subchondral sclerosis, and/or osteophyte formation) 2. DDD and/or IVDD (Loss of disc height, desiccation or "Black disc disease") 3. Pars defects 4. Spondylolisthesis, spondylosis, and/or spondyloarthropathies (include Degree/Grade of displacement in mm) 5. Vertebral body Fractures, including age (old, new/acute) 70. Modic Type Changes 7. Demineralization 8. Bone pathology 9. Central, Lateral Recess, and/or Foraminal Stenosis (include AP diameter of stenosis in mm) 10. Surgical changes (hardware type, status, and presence of fibrosis) NOTE: Please specify level(s) and laterality.    Standing Status:   Future    Standing Expiration Date:   03/12/2020    Order Specific Question:   What is the patient's sedation requirement?    Answer:   No Sedation    Order Specific Question:   Does the patient have a pacemaker or implanted devices?    Answer:   No    Order Specific Question:   Preferred imaging location?    Answer:   [facility] (table limit-350lbs)    Order Specific Question:   Call Results- Best Contact Number?    Answer:   [phone] ([facility])    Order Specific Question:   Radiology Contrast Protocol - do NOT remove file path    Answer:   [email or web address]    Order Specific Question:   ** REASON FOR EXAM (FREE TEXT)    Answer:   Neck Pain & Radiculitis  . Neurostimulator Battery replacement    Standing Status:   Future    Standing Expiration Date:   12/13/2020    Scheduling Instructions:     Please go ahead and schedule the patient for a revision, not a replacement of her neurostimulator battery to see if we can implant deeper so as to avoid the discomfort that she is experiencing due to the implant.   Follow-up plan:   Return for RFA (w/ sedation): (L) L-FCT RFA #1, (ASAP).  In addition, today I have ordered a cervical spine MRI for her apparent worsening of the bilateral C6 radiculitis. Today I have also discontinued her hydrocodone  and  we have started the patient on oxycodone. In addition, I will be requesting approval for the revision of her spinal cord stimulator battery, as soon as they open up the OR for elective cases (COVID-19 restrictions).     Considering:   Possible revision of spinal cord stimulator battery implant to reposition the battery deeper. Possible bilateral lumbar facet RFA #1 Possible bilateral SI joint RFA #1 Diagnostic bilateral IA hip joint injection Diagnostic bilateral femoral + obturator NB Possible bilateral femoral + obturator nerve RFA Diagnostic right L1-2LESI Diagnostic [id number] Diagnostic right CESI Possible bilateral occipital nerve RFA #1 Diagnostic bilateral IA shoulder joint injection Diagnostic bilateral suprascapular NB Possible bilateral suprascapular nerve RFA   Palliative PRN treatment(s):   Diagnostic bilateral GONB #2 Palliative bilateral cervical facet blocks #5 Palliative bilateral cervical facet RFA #2  Palliative right L2-3 interlaminar LESI #3  Palliative right L1 TFESI #2 Bilateral lumbar spinal cord stimulator trial (done)  Diagnostic bilateral lumbar facet block #4  Diagnostic bilateral SI joint block #3     Recent Visits Date Type Provider Dept  11/30/19 Procedure visit Milinda Pointer, MD [facility]  11/24/19 Telemedicine Milinda Pointer, MD [facility]  Showing recent visits within past 90 days and meeting all other requirements   Today's Visits Date Type Provider Dept  12/14/19 Telemedicine Milinda Pointer, MD [facility]  Showing today's visits and meeting all other requirements   Future Appointments No visits were found meeting these conditions.  Showing future appointments within next 90 days and meeting all other requirements   I discussed the assessment and treatment plan with the patient. The patient was provided an opportunity to ask questions and all were answered. The  patient agreed with the plan and demonstrated an understanding of the instructions.  Patient advised to call back or seek an in-person evaluation if the symptoms or condition worsens.  Duration of encounter: 16 minutes.  Note by: Gaspar Cola, MD Date: 12/14/2019; Time: 11:50 AM

## 2019-12-21 ENCOUNTER — Telehealth: Payer: Self-pay | Admitting: *Deleted

## 2019-12-28 ENCOUNTER — Encounter: Payer: Self-pay | Admitting: Pain Medicine

## 2019-12-28 ENCOUNTER — Ambulatory Visit (HOSPITAL_BASED_OUTPATIENT_CLINIC_OR_DEPARTMENT_OTHER): Payer: Medicare PPO | Admitting: Pain Medicine

## 2019-12-28 ENCOUNTER — Ambulatory Visit
Admission: RE | Admit: 2019-12-28 | Discharge: 2019-12-28 | Disposition: A | Discharge status: Home / Self Care | Payer: Medicare PPO | Source: Ambulatory Visit | Attending: Pain Medicine | Admitting: Pain Medicine

## 2019-12-28 ENCOUNTER — Other Ambulatory Visit: Payer: Self-pay

## 2019-12-28 VITALS — BP 130/75 | HR 65 | Temp 97.3°F | Resp 20 | Ht 61.0 in | Wt 140.0 lb

## 2019-12-28 DIAGNOSIS — M5137 Other intervertebral disc degeneration, lumbosacral region: Secondary | ICD-10-CM

## 2019-12-28 DIAGNOSIS — M47816 Spondylosis without myelopathy or radiculopathy, lumbar region: Secondary | ICD-10-CM

## 2019-12-28 DIAGNOSIS — G8929 Other chronic pain: Secondary | ICD-10-CM | POA: Insufficient documentation

## 2019-12-28 DIAGNOSIS — G8918 Other acute postprocedural pain: Secondary | ICD-10-CM | POA: Diagnosis present

## 2019-12-28 DIAGNOSIS — M47817 Spondylosis without myelopathy or radiculopathy, lumbosacral region: Secondary | ICD-10-CM | POA: Diagnosis present

## 2019-12-28 DIAGNOSIS — M5442 Lumbago with sciatica, left side: Secondary | ICD-10-CM | POA: Insufficient documentation

## 2019-12-28 DIAGNOSIS — M5441 Lumbago with sciatica, right side: Secondary | ICD-10-CM | POA: Insufficient documentation

## 2019-12-28 MED ORDER — HYDROCODONE-ACETAMINOPHEN 5-325 MG PO TABS: 1.0000 | ORAL_TABLET | Freq: Three times a day (TID) | ORAL | 0 refills | Status: DC | PRN

## 2019-12-28 MED ORDER — LACTATED RINGERS IV SOLN
1000.0000 mL | Freq: Once | INTRAVENOUS | Status: AC
  Administered 2019-12-28: 1000 mL via INTRAVENOUS

## 2019-12-28 MED ORDER — TRIAMCINOLONE ACETONIDE 40 MG/ML IJ SUSP
40.0000 mg | Freq: Once | INTRAMUSCULAR | Status: AC
  Administered 2019-12-28: 40 mg
  Filled 2019-12-28: qty 1

## 2019-12-28 MED ORDER — ROPIVACAINE HCL 2 MG/ML IJ SOLN
9.0000 mL | Freq: Once | INTRAMUSCULAR | Status: AC
  Administered 2019-12-28: 9 mL via PERINEURAL
  Filled 2019-12-28: qty 10

## 2019-12-28 MED ORDER — FENTANYL CITRATE (PF) 100 MCG/2ML IJ SOLN
25.0000 ug | INTRAMUSCULAR | Status: AC | PRN
  Administered 2019-12-28 (×2): 25 ug via INTRAVENOUS
  Filled 2019-12-28: qty 2

## 2019-12-28 MED ORDER — LIDOCAINE HCL 2 % IJ SOLN
20.0000 mL | Freq: Once | INTRAMUSCULAR | Status: AC
  Administered 2019-12-28: 400 mg
  Filled 2019-12-28: qty 20

## 2019-12-28 MED ORDER — MIDAZOLAM HCL 5 MG/5ML IJ SOLN
1.0000 mg | INTRAMUSCULAR | Status: AC | PRN
  Administered 2019-12-28 (×2): 0.5 mg via INTRAVENOUS
  Administered 2019-12-28: 1 mg via INTRAVENOUS
  Filled 2019-12-28: qty 5

## 2019-12-28 NOTE — Progress Notes (Signed)
Safety precautions to be maintained throughout the outpatient stay will include: orient to surroundings, keep bed in low position, maintain call bell within reach at all times, provide assistance with transfer out of bed and ambulation.  

## 2019-12-28 NOTE — Progress Notes (Signed)
PROVIDER NOTE: Information contained herein reflects review and annotations entered in association with encounter. Interpretation of such information and data should be left to medically-trained personnel. Information provided to patient can be located elsewhere in the medical record under "Patient Instructions". Document created using STT-dictation technology, any transcriptional errors that may result from process are unintentional.    Patient: Karen Edwards  Service Category: Procedure  Provider: Gaspar Cola, MD  DOB: 1966/10/28  DOS: 12/28/2019  Location: [facility]  MRN: [id number]  Setting: Ambulatory - outpatient  Referring Provider: Milinda Pointer, MD  Type: Established Patient  Specialty: Interventional Pain Management  PCP: Pleas Koch, NP   Primary Reason for Visit: Interventional Pain Management Treatment. CC: Back Pain and Neck Pain  Procedure:          Anesthesia, Analgesia, Anxiolysis:  Type: Thermal Lumbar Facet, Medial Branch Radiofrequency Ablation/Neurotomy  #1  Primary Purpose: Therapeutic Region: Posterolateral Lumbosacral Spine Level: L2, L3, L4, L5, & S1 Medial Branch Level(s). These levels will denervate the L3-4, L4-5, and the L5-S1 lumbar facet joints. Laterality: Left  Type: Moderate (Conscious) Sedation combined with Local Anesthesia Indication(s): Analgesia and Anxiety Route: Intravenous (IV) IV Access: Secured Sedation: Meaningful verbal contact was maintained at all times during the procedure  Local Anesthetic: Lidocaine 1-2%  Position: Prone   Indications: 1. 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 Spondylosis without myelopathy or radiculopathy, lumbosacral region   3. DDD (degenerative disc disease), lumbosacral   4. Lumbar facet hypertrophy (Bilateral)   5. Lumbar spondylosis   6. Chronic low back pain (Primary Area of Pain) (Bilateral) (R>L)    Karen Edwards has been dealing with the above chronic pain for  longer than three months and has either failed to respond, was unable to tolerate, or simply did not get enough benefit from other more conservative therapies including, but not limited to: 1. Over-the-counter medications 2. Anti-inflammatory medications 3. Muscle relaxants 4. Membrane stabilizers 5. Opioids 6. Physical therapy and/or chiropractic manipulation 7. Modalities (Heat, ice, etc.) 8. Invasive techniques such as nerve blocks. Karen Edwards has attained more than 50% relief of the pain from a series of diagnostic injections conducted in separate occasions.  Pain Score: Pre-procedure: 4 /10 Post-procedure: 0-No pain/10  Pre-op Assessment:  Karen Edwards is a 54 y.o. (year old), female patient, seen today for interventional treatment. She  has a past surgical history that includes Cesarean section; Ankle surgery (Right, 03/02/2014); Hemorrhoid surgery (N/A, 06/16/2015); Cervical spine surgery (02/21/2016); Cesarean section with bilateral tubal ligation (1990); Colonoscopy (04/19/2015); Hysteroscopy with D & C (12/24/2013); Colonoscopy with propofol (N/A, 05/18/2018); Esophagogastroduodenoscopy (egd) with propofol (N/A, 05/18/2018); and Spinal cord stimulator insertion (N/A, 06/18/2018). Karen Edwards has a current medication list which includes the following prescription(s): biotin, flaxseed (linseed), fluoxetine, lidocaine, methocarbamol, oxycodone, [START ON 01/18/2020] oxycodone, [START ON 02/17/2020] oxycodone, trazodone, hydrocodone-acetaminophen, and [START ON 01/04/2020] hydrocodone-acetaminophen. Her primarily concern today is the Back Pain and Neck Pain  Initial Vital Signs:  Pulse/HCG Rate: 66ECG Heart Rate: (!) 59 Temp: 99.1 F (37.3 C) Resp: 18 BP: 122/69 SpO2: 99 %  BMI: Estimated body mass index is 26.45 kg/m as calculated from the following:   Height as of this encounter: 5\' 1"  (1.549 m).   Weight as of this encounter: 140 lb (63.5 kg).  Risk Assessment: Allergies:  Reviewed. She has No Known Allergies.  Allergy Precautions: None required Coagulopathies: Reviewed. None identified.  Blood-thinner therapy: None at this time Active Infection(s): Reviewed. None identified. Ms.  Edwards is afebrile  Site Confirmation: Karen Edwards was asked to confirm the procedure and laterality before marking the site Procedure checklist: Completed Consent: Before the procedure and under the influence of no sedative(s), amnesic(s), or anxiolytics, the patient was informed of the treatment options, risks and possible complications. To fulfill our ethical and legal obligations, as recommended by the American Medical Association's Code of Ethics, I have informed the patient of my clinical impression; the nature and purpose of the treatment or procedure; the risks, benefits, and possible complications of the intervention; the alternatives, including doing nothing; the risk(s) and benefit(s) of the alternative treatment(s) or procedure(s); and the risk(s) and benefit(s) of doing nothing. The patient was provided information about the general risks and possible complications associated with the procedure. These may include, but are not limited to: failure to achieve desired goals, infection, bleeding, organ or nerve damage, allergic reactions, paralysis, and death. In addition, the patient was informed of those risks and complications associated to Spine-related procedures, such as failure to decrease pain; infection (i.e.: Meningitis, epidural or intraspinal abscess); bleeding (i.e.: epidural hematoma, subarachnoid hemorrhage, or any other type of intraspinal or peri-dural bleeding); organ or nerve damage (i.e.: Any type of peripheral nerve, nerve root, or spinal cord injury) with subsequent damage to sensory, motor, and/or autonomic systems, resulting in permanent pain, numbness, and/or weakness of one or several areas of the body; allergic reactions; (i.e.: anaphylactic reaction); and/or  death. Furthermore, the patient was informed of those risks and complications associated with the medications. These include, but are not limited to: allergic reactions (i.e.: anaphylactic or anaphylactoid reaction(s)); adrenal axis suppression; blood sugar elevation that in diabetics may result in ketoacidosis or comma; water retention that in patients with history of congestive heart failure may result in shortness of breath, pulmonary edema, and decompensation with resultant heart failure; weight gain; swelling or edema; medication-induced neural toxicity; particulate matter embolism and blood vessel occlusion with resultant organ, and/or nervous system infarction; and/or aseptic necrosis of one or more joints. Finally, the patient was informed that Medicine is not an exact science; therefore, there is also the possibility of unforeseen or unpredictable risks and/or possible complications that may result in a catastrophic outcome. The patient indicated having understood very clearly. We have given the patient no guarantees and we have made no promises. Enough time was given to the patient to ask questions, all of which were answered to the patient's satisfaction. Ms. Strick has indicated that she wanted to continue with the procedure. Attestation: I, the ordering provider, attest that I have discussed with the patient the benefits, risks, side-effects, alternatives, likelihood of achieving goals, and potential problems during recovery for the procedure that I have provided informed consent. Date  Time: 12/28/2019 12:48 PM  Pre-Procedure Preparation:  Monitoring: As per clinic protocol. Respiration, ETCO2, SpO2, BP, heart rate and rhythm monitor placed and checked for adequate function Safety Precautions: Patient was assessed for positional comfort and pressure points before starting the procedure. Time-out: I initiated and conducted the "Time-out" before starting the procedure, as per protocol. The  patient was asked to participate by confirming the accuracy of the "Time Out" information. Verification of the correct person, site, and procedure were performed and confirmed by me, the nursing staff, and the patient. "Time-out" conducted as per Joint Commission's Universal Protocol (UP.01.01.01). Time: 1335  Description of Procedure:          Laterality: Left Levels:  L2, L3, L4, L5, & S1 Medial Branch Level(s), at the L3-4,  L4-5, and the L5-S1 lumbar facet joints. Area Prepped: Lumbosacral Prepping solution: DuraPrep (Iodine Povacrylex [0.7% available iodine] and Isopropyl Alcohol, 74% w/w) Safety Precautions: Aspiration looking for blood return was conducted prior to all injections. At no point did we inject any substances, as a needle was being advanced. Before injecting, the patient was told to immediately notify me if she was experiencing any new onset of "ringing in the ears, or metallic taste in the mouth". No attempts were made at seeking any paresthesias. Safe injection practices and needle disposal techniques used. Medications properly checked for expiration dates. SDV (single dose vial) medications used. After the completion of the procedure, all disposable equipment used was discarded in the proper designated medical waste containers. Local Anesthesia: Protocol guidelines were followed. The patient was positioned over the fluoroscopy table. The area was prepped in the usual manner. The time-out was completed. The target area was identified using fluoroscopy. A 12-in long, straight, sterile hemostat was used with fluoroscopic guidance to locate the targets for each level blocked. Once located, the skin was marked with an approved surgical skin marker. Once all sites were marked, the skin (epidermis, dermis, and hypodermis), as well as deeper tissues (fat, connective tissue and muscle) were infiltrated with a small amount of a short-acting local anesthetic, loaded on a 10cc syringe with a 25G,  1.5-in  Needle. An appropriate amount of time was allowed for local anesthetics to take effect before proceeding to the next step. Local Anesthetic: Lidocaine 2.0% The unused portion of the local anesthetic was discarded in the proper designated containers. Technical explanation of process:  Radiofrequency Ablation (RFA) L2 Medial Branch Nerve RFA: The target area for the L2 medial branch is at the junction of the postero-lateral aspect of the superior articular process and the superior, posterior, and medial edge of the transverse process of L3. Under fluoroscopic guidance, a Radiofrequency needle was inserted until contact was made with os over the superior postero-lateral aspect of the pedicular shadow (target area). Sensory and motor testing was conducted to properly adjust the position of the needle. Once satisfactory placement of the needle was achieved, the numbing solution was slowly injected after negative aspiration for blood. 2.0 mL of the nerve block solution was injected without difficulty or complication. After waiting for at least 3 minutes, the ablation was performed. Once completed, the needle was removed intact. L3 Medial Branch Nerve RFA: The target area for the L3 medial branch is at the junction of the postero-lateral aspect of the superior articular process and the superior, posterior, and medial edge of the transverse process of L4. Under fluoroscopic guidance, a Radiofrequency needle was inserted until contact was made with os over the superior postero-lateral aspect of the pedicular shadow (target area). Sensory and motor testing was conducted to properly adjust the position of the needle. Once satisfactory placement of the needle was achieved, the numbing solution was slowly injected after negative aspiration for blood. 2.0 mL of the nerve block solution was injected without difficulty or complication. After waiting for at least 3 minutes, the ablation was performed. Once completed,  the needle was removed intact. L4 Medial Branch Nerve RFA: The target area for the L4 medial branch is at the junction of the postero-lateral aspect of the superior articular process and the superior, posterior, and medial edge of the transverse process of L5. Under fluoroscopic guidance, a Radiofrequency needle was inserted until contact was made with os over the superior postero-lateral aspect of the pedicular shadow (target area). Sensory  and motor testing was conducted to properly adjust the position of the needle. Once satisfactory placement of the needle was achieved, the numbing solution was slowly injected after negative aspiration for blood. 2.0 mL of the nerve block solution was injected without difficulty or complication. After waiting for at least 3 minutes, the ablation was performed. Once completed, the needle was removed intact. L5 Medial Branch Nerve RFA: The target area for the L5 medial branch is at the junction of the postero-lateral aspect of the superior articular process of S1 and the superior, posterior, and medial edge of the sacral ala. Under fluoroscopic guidance, a Radiofrequency needle was inserted until contact was made with os over the superior postero-lateral aspect of the pedicular shadow (target area). Sensory and motor testing was conducted to properly adjust the position of the needle. Once satisfactory placement of the needle was achieved, the numbing solution was slowly injected after negative aspiration for blood. 2.0 mL of the nerve block solution was injected without difficulty or complication. After waiting for at least 3 minutes, the ablation was performed. Once completed, the needle was removed intact. S1 Medial Branch Nerve RFA: The target area for the S1 medial branch is located inferior to the junction of the S1 superior articular process and the L5 inferior articular process, posterior, inferior, and lateral to the 6 o'clock position of the L5-S1 facet joint, just  superior to the S1 posterior foramen. Under fluoroscopic guidance, the Radiofrequency needle was advanced until contact was made with os over the Target area. Sensory and motor testing was conducted to properly adjust the position of the needle. Once satisfactory placement of the needle was achieved, the numbing solution was slowly injected after negative aspiration for blood. 2.0 mL of the nerve block solution was injected without difficulty or complication. After waiting for at least 3 minutes, the ablation was performed. Once completed, the needle was removed intact. Radiofrequency lesioning (ablation):  Radiofrequency Generator: NeuroTherm [id number] Sensory Stimulation Parameters: 50 Hz was used to locate & identify the nerve, making sure that the needle was positioned such that there was no sensory stimulation below 0.3 V or above 0.7 V. Motor Stimulation Parameters: 2 Hz was used to evaluate the motor component. Care was taken not to lesion any nerves that demonstrated motor stimulation of the lower extremities at an output of less than 2.5 times that of the sensory threshold, or a maximum of 2.0 V. Lesioning Technique Parameters: Standard Radiofrequency settings. (Not bipolar or pulsed.) Temperature Settings: 80 degrees C Lesioning time: 60 seconds Intra-operative Compliance: Compliant Materials & Medications: Needle(s) (Electrode/Cannula) Type: Teflon-coated, curved tip, Radiofrequency needle(s) Gauge: 22G Length: 10cm Numbing solution: 0.2% PF-Ropivacaine + Triamcinolone (40 mg/mL) diluted to a final concentration of 4 mg of Triamcinolone/mL of Ropivacaine The unused portion of the solution was discarded in the proper designated containers.  Once the entire procedure was completed, the treated area was cleaned, making sure to leave some of the prepping solution back to take advantage of its long term bactericidal properties.  Illustration of the posterior view of the lumbar spine and the  posterior neural structures. Laminae of L2 through S1 are labeled. DPRL5, dorsal primary ramus of L5; DPRS1, dorsal primary ramus of S1; DPR3, dorsal primary ramus of L3; FJ, facet (zygapophyseal) joint L3-L4; I, inferior articular process of L4; LB1, lateral branch of dorsal primary ramus of L1; IAB, inferior articular branches from L3 medial branch (supplies L4-L5 facet joint); IBP, intermediate branch plexus; MB3, medial branch of dorsal primary ramus  of L3; NR3, third lumbar nerve root; S, superior articular process of L5; SAB, superior articular branches from L4 (supplies L4-5 facet joint also); TP3, transverse process of L3.  Vitals:   12/28/19 1410 12/28/19 1420 12/28/19 1431 12/28/19 1442  BP: 137/68 (!) 129/58 134/75 130/75  Pulse: 65 65    Resp: 18 (!) 21 20 20   Temp:  97.7 F (36.5 C)  (!) 97.3 F (36.3 C)  TempSrc:  Temporal    SpO2: 98% 100% 97% 99%  Weight:      Height:       Start Time: 1335 hrs. End Time: 1408 hrs.  Imaging Guidance (Spinal):          Type of Imaging Technique: Fluoroscopy Guidance (Spinal) Indication(s): Assistance in needle guidance and placement for procedures requiring needle placement in or near specific anatomical locations not easily accessible without such assistance. Exposure Time: Please see nurses notes. Contrast: None used. Fluoroscopic Guidance: I was personally present during the use of fluoroscopy. "Tunnel Vision Technique" used to obtain the best possible view of the target area. Parallax error corrected before commencing the procedure. "Direction-depth-direction" technique used to introduce the needle under continuous pulsed fluoroscopy. Once target was reached, antero-posterior, oblique, and lateral fluoroscopic projection used confirm needle placement in all planes. Images permanently stored in EMR. Interpretation: No contrast injected. I personally interpreted the imaging intraoperatively. Adequate needle placement confirmed in multiple  planes. Permanent images saved into the patient's record.  Antibiotic Prophylaxis:   Anti-infectives (From admission, onward)   None     Indication(s): None identified  Post-operative Assessment:  Post-procedure Vital Signs:  Pulse/HCG Rate: 6561 Temp: (!) 97.3 F (36.3 C) Resp: 20 BP: 130/75 SpO2: 99 %  EBL: None  Complications: No immediate post-treatment complications observed by team, or reported by patient.  Note: The patient tolerated the entire procedure well. A repeat set of vitals were taken after the procedure and the patient was kept under observation following institutional policy, for this type of procedure. Post-procedural neurological assessment was performed, showing return to baseline, prior to discharge. The patient was provided with post-procedure discharge instructions, including a section on how to identify potential problems. Should any problems arise concerning this procedure, the patient was given instructions to immediately contact us, at any time, without hesitation. In any case, we plan to contact the patient by telephone for a follow-up status report regarding this interventional procedure.  Comments:  No additional relevant information.  Plan of Care  Orders:  Orders Placed This Encounter  Procedures  . Radiofrequency,Lumbar    Scheduling Instructions:     Side(s): Left-sided     Level: L3-4, L4-5, & L5-S1 Facets (L2, L3, L4, L5, & S1 Medial Branch Nerves)     Sedation: With Sedation     Timeframe: Today    Order Specific Question:   Where will this procedure be performed?    Answer:   [facility]  . Radiofrequency,Lumbar    Standing Status:   Future    Standing Expiration Date:   06/29/2021    Scheduling Instructions:     Side(s): Right-sided     Level: L3-4, L4-5, & L5-S1 Facets (L2, L3, L4, L5, & S1 Medial Branch Nerves)     Sedation: With Sedation     Scheduling Timeframe: 2 weeks from now    Order Specific Question:   Where will  this procedure be performed?    Answer:   [facility]  . [place]  1-60 MIN NO REPORT    Intraoperative interpretation by procedural physician at [place].    Standing Status:   Standing    Number of Occurrences:   1    Order Specific Question:   Reason for exam:    Answer:   Assistance in needle guidance and placement for procedures requiring needle placement in or near specific anatomical locations not easily accessible without such assistance.  . Informed Consent Details: Physician/Practitioner Attestation; Transcribe to consent form and obtain patient signature    Nursing Order: Transcribe to consent form and obtain patient signature. Note: Always confirm laterality of pain with Ms. Tye Savoy, before procedure. Procedure: Lumbar Facet Radiofrequency Ablation Indication/Reason: Low Back Pain, with our without leg pain, due to Facet Joint Arthralgia (Joint Pain) known as Lumbar Facet Syndrome, secondary to Lumbar, and/or Lumbosacral Spondylosis (Arthritis of the Spine), without myelopathy or radiculopathy (Nerve Damage). Provider Attestation: I, C[place]ton Dossie Arbour, MD, (Pain Management Specialist), the physician/practitioner, attest that I have discussed with the patient the benefits, risks, side effects, alternatives, likelihood of achieving goals and potential problems during recovery for the procedure that I have provided informed consent.  . Provide equipment / supplies at bedside    Equipment required: Sterile "Radiofrequency Tray"; Large hemostat (1); Small hemostat (1); Towels (6-8); 4x4 sterile sponge pack (1) Radiofrequency Needle(s): Size: Regular Quantity: 5    Standing Status:   Standing    Number of Occurrences:   1    Order Specific Question:   Specify    Answer:   Radiofrequency Tray   Chronic Opioid Analgesic:  Oxycodone IR 5 mg, 1 tab PO BID (10 mg/day of oxycodone) MME/day:15mg /day.   Medications ordered for procedure: Meds ordered  this encounter  Medications  . lidocaine (XYLOCAINE) 2 % (with pres) injection 400 mg  . lactated ringers infusion 1,000 mL  . midazolam (VERSED) 5 MG/5ML injection 1-2 mg    Make sure Flumazenil is available in the pyxis when using this medication. If oversedation occurs, administer 0.2 mg IV over 15 sec. If after 45 sec no response, administer 0.2 mg again over 1 min; may repeat at 1 min intervals; not to exceed 4 doses (1 mg)  . fentaNYL (SUBLIMAZE) injection 25-50 mcg    Make sure Narcan is available in the pyxis when using this medication. In the event of respiratory depression (RR< 8/min): Titrate NARCAN (naloxone) in increments of 0.1 to 0.2 mg IV at 2-3 minute intervals, until desired degree of reversal.  . ropivacaine (PF) 2 mg/mL (0.2%) (NAROPIN) injection 9 mL  . triamcinolone acetonide (KENALOG-40) injection 40 mg  . HYDROcodone-acetaminophen (NORCO/VICODIN) 5-325 MG tablet    Sig: Take 1 tablet by mouth every 8 (eight) hours as needed for up to 7 days for severe pain. Must last 7 days.    Dispense:  21 tablet    Refill:  0    For acute post-operative pain. Not to be refilled. Must last 7 days.  Marland Kitchen HYDROcodone-acetaminophen (NORCO/VICODIN) 5-325 MG tablet    Sig: Take 1 tablet by mouth every 8 (eight) hours as needed for up to 7 days for severe pain. Must last for 7 days.    Dispense:  21 tablet    Refill:  0    For acute post-operative pain. Not to be refilled. Must last 7 days.   Medications administered: We administered lidocaine, lactated ringers, midazolam, fentaNYL, ropivacaine (PF) 2 mg/mL (0.2%), and triamcinolone acetonide.  See the medical record for exact dosing, route, and  time of administration.  Follow-up plan:   Return in about 2 weeks (around 01/11/2020) for RFA (w/ sedation): (R) L-FCT RFA #1.       Considering:   Possible revision of spinal cord stimulator battery implant to reposition the battery deeper. Possible bilateral lumbar facet RFA #1 Possible  bilateral SI joint RFA #1 Diagnostic bilateral IA hip joint injection Diagnostic bilateral femoral + obturator NB Possible bilateral femoral + obturator nerve RFA Diagnostic right L1-2LESI Diagnostic [id number] Diagnostic right CESI Possible bilateral occipital nerve RFA #1 Diagnostic bilateral IA shoulder joint injection Diagnostic bilateral suprascapular NB Possible bilateral suprascapular nerve RFA   Palliative PRN treatment(s):   Diagnostic bilateral GONB #2 Palliative bilateral cervical facet blocks #5 Palliative bilateral cervical facet RFA #2  Palliative right L2-3 interlaminar LESI #3  Palliative right L1 TFESI #2 Bilateral lumbar spinal cord stimulator trial (done)  Diagnostic bilateral lumbar facet block #4  Diagnostic bilateral SI joint block #3     Recent Visits Date Type Provider Dept  12/28/19 Procedure visit Milinda Pointer, MD [facility]  12/14/19 Telemedicine Milinda Pointer, MD [facility]  11/30/19 Procedure visit Milinda Pointer, MD [facility]  11/24/19 Telemedicine Milinda Pointer, MD [facility]  Showing recent visits within past 90 days and meeting all other requirements   Future Appointments Date Type Provider Dept  01/11/20 Appointment Milinda Pointer, MD [facility]  03/15/20 Appointment Milinda Pointer, MD [facility]  Showing future appointments within next 90 days and meeting all other requirements   Disposition: Discharge home  Discharge (Date  Time): 12/28/2019; 1443 hrs.   Primary Care Physician: Pleas Koch, NP Location: [facility] Note by: Gaspar Cola, MD Date: 12/28/2019; Time: 9:35 AM  Disclaimer:  Medicine is not an Chief Strategy Officer. The only guarantee in medicine is that nothing is guaranteed. It is important to note that the decision to proceed with this intervention was based on the  information collected from the patient. The Data and conclusions were drawn from the patient's questionnaire, the interview, and the physical examination. Because the information was provided in large part by the patient, it cannot be guaranteed that it has not been purposely or unconsciously manipulated. Every effort has been made to obtain as much relevant data as possible for this evaluation. It is important to note that the conclusions that lead to this procedure are derived in large part from the available data. Always take into account that the treatment will also be dependent on availability of resources and existing treatment guidelines, considered by other Pain Management Practitioners as being common knowledge and practice, at the time of the intervention. For Medico-Legal purposes, it is also important to point out that variation in procedural techniques and pharmacological choices are the acceptable norm. The indications, contraindications, technique, and results of the above procedure should only be interpreted and judged by a Board-Certified Interventional Pain Specialist with extensive familiarity and expertise in the same exact procedure and technique.

## 2019-12-28 NOTE — Patient Instructions (Addendum)
___________________________________________________________________________________________  Post-Radiofrequency (RF) Discharge Instructions  You have just completed a Radiofrequency Neurotomy.  The following instructions will provide you with information and guidelines for self-care upon discharge.  If at any time you have questions or concerns please call your physician. DO NOT DRIVE YOURSELF!!  Instructions:  Apply ice: Fill a plastic sandwich bag with crushed ice. Cover it with a small towel and apply to injection site. Apply for 15 minutes then remove x 15 minutes. Repeat sequence on day of procedure, until you go to bed. The purpose is to minimize swelling and discomfort after procedure.  Apply heat: Apply heat to procedure site starting the day following the procedure. The purpose is to treat any soreness and discomfort from the procedure.  Food intake: No eating limitations, unless stipulated above.  Nevertheless, if you have had sedation, you may experience some nausea.  In this case, it may be wise to wait at least two hours prior to resuming regular diet.  Physical activities: Keep activities to a minimum for the first 8 hours after the procedure. For the first 24 hours after the procedure, do not drive a motor vehicle,  Operate heavy machinery, power tools, or handle any weapons.  Consider walking with the use of an assistive device or accompanied by an adult for the first 24 hours.  Do not drink alcoholic beverages including beer.  Do not make any important decisions or sign any legal documents. Go home and rest today.  Resume activities tomorrow, as tolerated.  Use caution in moving about as you may experience mild leg weakness.  Use caution in cooking, use of household electrical appliances and climbing steps.  Driving: If you have received any sedation, you are not allowed to drive for 24 hours after your procedure.  Blood thinner: Restart your blood thinner 6 hours after your  procedure. (Only for those taking blood thinners)  Insulin: As soon as you can eat, you may resume your normal dosing schedule. (Only for those taking insulin)  Medications: May resume pre-procedure medications.  Do not take any drugs, other than what has been prescribed to you.  Infection prevention: Keep procedure site clean and dry.  Post-procedure Pain Diary: Extremely important that this be done correctly and accurately. Recorded information will be used to determine the next step in treatment.  Pain evaluated is that of treated area only. Do not include pain from an untreated area.  Complete every hour, on the hour, for the initial 8 hours. Set an alarm to help you do this part accurately.  Do not go to sleep and have it completed later. It will not be accurate.  Follow-up appointment: Keep your follow-up appointment after the procedure. Usually 2 weeks for most procedures. (6 weeks in the case of radiofrequency.) Bring you pain diary.   Expect:  From numbing medicine (AKA: Local Anesthetics): Numbness or decrease in pain.  Onset: Full effect within 15 minutes of injected.  Duration: It will depend on the type of local anesthetic used. On the average, 1 to 8 hours.   From steroids: Decrease in swelling or inflammation. Once inflammation is improved, relief of the pain will follow.  Onset of benefits: Depends on the amount of swelling present. The more swelling, the longer it will take for the benefits to be seen. In some cases, up to 10 days.  Duration: Steroids will stay in the system x 2 weeks. Duration of benefits will depend on multiple posibilities including persistent irritating factors.  From procedure: Some   discomfort is to be expected once the numbing medicine wears off. This should be minimal if ice and heat are applied as instructed.  Call if:  You experience numbness and weakness that gets worse with time, as opposed to wearing off.  He experience any unusual  bleeding, difficulty breathing, or loss of the ability to control your bowel and bladder. (This applies to Spinal procedures only)  You experience any redness, swelling, heat, red streaks, elevated temperature, fever, or any other signs of a possible infection.  Emergency Numbers:  [place] hours (Monday - Thursday, 8:00 AM - 4:00 PM) (Friday, 9:00 AM - 12:00 Noon): [phone]  After hours: [phone] ____________________________________________________________________________________________   ____________________________________________________________________________________________  Preparing for Procedure with Sedation  Procedure appointments are limited to planned procedures: . No Prescription Refills. . No disability issues will be discussed. . No medication changes will be discussed.  Instructions: . Oral Intake: Do not eat or drink anything for at least 8 hours prior to your procedure. . Transportation: Public transportation is not allowed. Bring an adult driver. The driver must be physically present in our waiting room before any procedure can be started. [place] Physical Assistance: Bring an adult physically capable of assisting you, in the event you need help. This adult should keep you company at home for at least 6 hours after the procedure. . Blood Pressure Medicine: Take your blood pressure medicine with a sip of water the morning of the procedure. . Blood thinners: Notify our staff if you are taking any blood thinners. Depending on which one you take, there will be specific instructions on how and when to stop it. . Diabetics on insulin: Notify the staff so that you can be scheduled 1st case in the morning. If your diabetes requires high dose insulin, take only  of your normal insulin dose the morning of the procedure and notify the staff that you have done so. . Preventing infections: Shower with an antibacterial soap the morning of your procedure. . Build-up  your immune system: Take 1000 mg of Vitamin C with every meal (3 times a day) the day prior to your procedure. [place] Kitchen Antibiotics: Inform the staff if you have a condition or reason that requires you to take antibiotics before dental procedures. . Pregnancy: If you are pregnant, call and cancel the procedure. . Sickness: If you have a cold, fever, or any active infections, call and cancel the procedure. . Arrival: You must be in the facility at least 30 minutes prior to your scheduled procedure. . Children: Do not bring children with you. . Dress appropriately: Bring dark clothing that you would not mind if they get stained. . Valuables: Do not bring any jewelry or valuables.  Reasons to call and reschedule or cancel your procedure: (Following these recommendations will minimize the risk of a serious complication.) . Surgeries: Avoid having procedures within 2 weeks of any surgery. (Avoid for 2 weeks before or after any surgery). . Flu Shots: Avoid having procedures within 2 weeks of a flu shots or . (Avoid for 2 weeks before or after immunizations). . Barium: Avoid having a procedure within 7-10 days after having had a radiological study involving the use of radiological contrast. (Myelograms, Barium swallow or enema study). . Heart attacks: Avoid any elective procedures or surgeries for the initial 6 months after a "Myocardial Infarction" (Heart Attack). . Blood thinners: It is imperative that you stop these medications before procedures. Let us know if you if you take any blood thinner.  Marland Kitchen  Infection: Avoid procedures during or within two weeks of an infection (including chest colds or gastrointestinal problems). Symptoms associated with infections include: Localized redness, fever, chills, night sweats or profuse sweating, burning sensation when voiding, cough, congestion, stuffiness, runny nose, sore throat, diarrhea, nausea, vomiting, cold or Flu symptoms, recent or current infections. It is specially  important if the infection is over the area that we intend to treat. Marland Kitchen Heart and lung problems: Symptoms that may suggest an active cardiopulmonary problem include: cough, chest pain, breathing difficulties or shortness of breath, dizziness, ankle swelling, uncontrolled high or unusually low blood pressure, and/or palpitations. If you are experiencing any of these symptoms, cancel your procedure and contact your primary care physician for an evaluation.  Remember:  Regular Business hours are:  Monday to Thursday 8:00 AM to 4:00 PM  Provider's Schedule: Milinda Pointer, MD:  Procedure days: Tuesday and Thursday 7:30 AM to 4:00 PM  Gillis Santa, MD:  Procedure days: Monday and Wednesday 7:30 AM to 4:00 PM ____________________________________________________________________________________________

## 2019-12-29 ENCOUNTER — Telehealth: Payer: Self-pay

## 2019-12-29 NOTE — Telephone Encounter (Signed)
Post procedure phone call. Patient states she is doing good.  

## 2019-12-31 ENCOUNTER — Ambulatory Visit
Admission: RE | Admit: 2019-12-31 | Discharge: 2019-12-31 | Disposition: A | Discharge status: Home / Self Care | Payer: Medicare PPO | Source: Ambulatory Visit | Attending: Pain Medicine | Admitting: Pain Medicine

## 2019-12-31 ENCOUNTER — Other Ambulatory Visit: Payer: Self-pay

## 2019-12-31 DIAGNOSIS — M5412 Radiculopathy, cervical region: Secondary | ICD-10-CM | POA: Insufficient documentation

## 2019-12-31 DIAGNOSIS — M5030 Other cervical disc degeneration, unspecified cervical region: Secondary | ICD-10-CM

## 2019-12-31 DIAGNOSIS — Z9889 Other specified postprocedural states: Secondary | ICD-10-CM | POA: Diagnosis present

## 2019-12-31 DIAGNOSIS — M542 Cervicalgia: Secondary | ICD-10-CM

## 2019-12-31 DIAGNOSIS — M4802 Spinal stenosis, cervical region: Secondary | ICD-10-CM | POA: Insufficient documentation

## 2019-12-31 DIAGNOSIS — G8929 Other chronic pain: Secondary | ICD-10-CM

## 2020-01-11 ENCOUNTER — Other Ambulatory Visit: Payer: Self-pay

## 2020-01-11 ENCOUNTER — Ambulatory Visit
Admission: RE | Admit: 2020-01-11 | Discharge: 2020-01-11 | Disposition: A | Discharge status: Home / Self Care | Payer: Medicare PPO | Source: Ambulatory Visit | Attending: Pain Medicine | Admitting: Pain Medicine

## 2020-01-11 ENCOUNTER — Ambulatory Visit (HOSPITAL_BASED_OUTPATIENT_CLINIC_OR_DEPARTMENT_OTHER): Payer: Medicare PPO | Admitting: Pain Medicine

## 2020-01-11 ENCOUNTER — Encounter: Payer: Self-pay | Admitting: Pain Medicine

## 2020-01-11 VITALS — BP 124/82 | HR 74 | Temp 97.9°F | Resp 16 | Ht 61.0 in | Wt 140.0 lb

## 2020-01-11 DIAGNOSIS — M5137 Other intervertebral disc degeneration, lumbosacral region: Secondary | ICD-10-CM

## 2020-01-11 DIAGNOSIS — G8929 Other chronic pain: Secondary | ICD-10-CM | POA: Insufficient documentation

## 2020-01-11 DIAGNOSIS — M5442 Lumbago with sciatica, left side: Secondary | ICD-10-CM

## 2020-01-11 DIAGNOSIS — M25552 Pain in left hip: Secondary | ICD-10-CM

## 2020-01-11 DIAGNOSIS — G8918 Other acute postprocedural pain: Secondary | ICD-10-CM | POA: Diagnosis present

## 2020-01-11 DIAGNOSIS — M25551 Pain in right hip: Secondary | ICD-10-CM

## 2020-01-11 DIAGNOSIS — M47816 Spondylosis without myelopathy or radiculopathy, lumbar region: Secondary | ICD-10-CM | POA: Insufficient documentation

## 2020-01-11 DIAGNOSIS — M5441 Lumbago with sciatica, right side: Secondary | ICD-10-CM

## 2020-01-11 DIAGNOSIS — M47817 Spondylosis without myelopathy or radiculopathy, lumbosacral region: Secondary | ICD-10-CM

## 2020-01-11 MED ORDER — FENTANYL CITRATE (PF) 100 MCG/2ML IJ SOLN
INTRAMUSCULAR | Status: AC
  Filled 2020-01-11: qty 2

## 2020-01-11 MED ORDER — LIDOCAINE HCL 2 % IJ SOLN
INTRAMUSCULAR | Status: AC
  Filled 2020-01-11: qty 20

## 2020-01-11 MED ORDER — FENTANYL CITRATE (PF) 100 MCG/2ML IJ SOLN
25.0000 ug | INTRAMUSCULAR | Status: DC | PRN
  Administered 2020-01-11: 25 ug via INTRAVENOUS

## 2020-01-11 MED ORDER — ROPIVACAINE HCL 2 MG/ML IJ SOLN
INTRAMUSCULAR | Status: AC
  Filled 2020-01-11: qty 10

## 2020-01-11 MED ORDER — ROPIVACAINE HCL 2 MG/ML IJ SOLN
9.0000 mL | Freq: Once | INTRAMUSCULAR | Status: AC
  Administered 2020-01-11: 9 mL via PERINEURAL

## 2020-01-11 MED ORDER — HYDROCODONE-ACETAMINOPHEN 5-325 MG PO TABS: 1.0000 | ORAL_TABLET | Freq: Three times a day (TID) | ORAL | 0 refills | Status: DC | PRN

## 2020-01-11 MED ORDER — MIDAZOLAM HCL 5 MG/5ML IJ SOLN
1.0000 mg | INTRAMUSCULAR | Status: DC | PRN
  Administered 2020-01-11: 0.05 mg via INTRAVENOUS

## 2020-01-11 MED ORDER — TRIAMCINOLONE ACETONIDE 40 MG/ML IJ SUSP
INTRAMUSCULAR | Status: AC
  Filled 2020-01-11: qty 1

## 2020-01-11 MED ORDER — LACTATED RINGERS IV SOLN
1000.0000 mL | Freq: Once | INTRAVENOUS | Status: AC
  Administered 2020-01-11: 11:00:00 1000 mL via INTRAVENOUS

## 2020-01-11 MED ORDER — LIDOCAINE HCL 2 % IJ SOLN
20.0000 mL | Freq: Once | INTRAMUSCULAR | Status: AC
  Administered 2020-01-11: 11:00:00 400 mg

## 2020-01-11 MED ORDER — TRIAMCINOLONE ACETONIDE 40 MG/ML IJ SUSP
40.0000 mg | Freq: Once | INTRAMUSCULAR | Status: AC
  Administered 2020-01-11: 11:00:00 40 mg

## 2020-01-11 MED ORDER — MIDAZOLAM HCL 5 MG/5ML IJ SOLN
INTRAMUSCULAR | Status: AC
  Filled 2020-01-11: qty 5

## 2020-01-11 NOTE — Progress Notes (Signed)
Safety precautions to be maintained throughout the outpatient stay will include: orient to surroundings, keep bed in low position, maintain call bell within reach at all times, provide assistance with transfer out of bed and ambulation.  

## 2020-01-11 NOTE — Patient Instructions (Addendum)
___________________________________________________________________________________________  Post-Radiofrequency (RF) Discharge Instructions  You have just completed a Radiofrequency Neurotomy.  The following instructions will provide you with information and guidelines for self-care upon discharge.  If at any time you have questions or concerns please call your physician. DO NOT DRIVE YOURSELF!!  Instructions:  Apply ice: Fill a plastic sandwich bag with crushed ice. Cover it with a small towel and apply to injection site. Apply for 15 minutes then remove x 15 minutes. Repeat sequence on day of procedure, until you go to bed. The purpose is to minimize swelling and discomfort after procedure.  Apply heat: Apply heat to procedure site starting the day following the procedure. The purpose is to treat any soreness and discomfort from the procedure.  Food intake: No eating limitations, unless stipulated above.  Nevertheless, if you have had sedation, you may experience some nausea.  In this case, it may be wise to wait at least two hours prior to resuming regular diet.  Physical activities: Keep activities to a minimum for the first 8 hours after the procedure. For the first 24 hours after the procedure, do not drive a motor vehicle,  Operate heavy machinery, power tools, or handle any weapons.  Consider walking with the use of an assistive device or accompanied by an adult for the first 24 hours.  Do not drink alcoholic beverages including beer.  Do not make any important decisions or sign any legal documents. Go home and rest today.  Resume activities tomorrow, as tolerated.  Use caution in moving about as you may experience mild leg weakness.  Use caution in cooking, use of household electrical appliances and climbing steps.  Driving: If you have received any sedation, you are not allowed to drive for 24 hours after your procedure.  Blood thinner: Restart your blood thinner 6 hours after your  procedure. (Only for those taking blood thinners)  Insulin: As soon as you can eat, you may resume your normal dosing schedule. (Only for those taking insulin)  Medications: May resume pre-procedure medications.  Do not take any drugs, other than what has been prescribed to you.  Infection prevention: Keep procedure site clean and dry.  Post-procedure Pain Diary: Extremely important that this be done correctly and accurately. Recorded information will be used to determine the next step in treatment.  Pain evaluated is that of treated area only. Do not include pain from an untreated area.  Complete every hour, on the hour, for the initial 8 hours. Set an alarm to help you do this part accurately.  Do not go to sleep and have it completed later. It will not be accurate.  Follow-up appointment: Keep your follow-up appointment after the procedure. Usually 2 weeks for most procedures. (6 weeks in the case of radiofrequency.) Bring you pain diary.   Expect:  From numbing medicine (AKA: Local Anesthetics): Numbness or decrease in pain.  Onset: Full effect within 15 minutes of injected.  Duration: It will depend on the type of local anesthetic used. On the average, 1 to 8 hours.   From steroids: Decrease in swelling or inflammation. Once inflammation is improved, relief of the pain will follow.  Onset of benefits: Depends on the amount of swelling present. The more swelling, the longer it will take for the benefits to be seen. In some cases, up to 10 days.  Duration: Steroids will stay in the system x 2 weeks. Duration of benefits will depend on multiple posibilities including persistent irritating factors.  From procedure: Some   discomfort is to be expected once the numbing medicine wears off. This should be minimal if ice and heat are applied as instructed. However, pain the discomfort from the procedure may last as long as 6 weeks.  Call if:  You experience numbness and weakness that  gets worse with time, as opposed to wearing off.  He experience any unusual bleeding, difficulty breathing, or loss of the ability to control your bowel and bladder. (This applies to Spinal procedures only)  You experience any redness, swelling, heat, red streaks, elevated temperature, fever, or any other signs of a possible infection.  Emergency Numbers:  [place] business hours (Monday - Thursday, 8:00 AM - 4:00 PM) (Friday, 9:00 AM - 12:00 Noon): [phone]  After hours: [phone] ____________________________________________________________________________________________

## 2020-01-11 NOTE — Progress Notes (Signed)
PROVIDER NOTE: Information contained herein reflects review and annotations entered in association with encounter. Interpretation of such information and data should be left to medically-trained personnel. Information provided to patient can be located elsewhere in the medical record under "Patient Instructions". Document created using STT-dictation technology, any transcriptional errors that may result from process are unintentional.    Patient: Karen Edwards  Service Category: Procedure  Provider: Gaspar Cola, MD  DOB: [birth year]  DOS: 01/11/2020  Location: [facility]  MRN: [id number]  Setting: Ambulatory - outpatient  Referring Provider: Milinda Pointer, MD  Type: Established Patient  Specialty: Interventional Pain Management  PCP: Pleas Koch, NP   Primary Reason for Visit: Interventional Pain Management Treatment. CC: Back Pain (right, ,lower)  Procedure:          Anesthesia, Analgesia, Anxiolysis:  Type: Thermal Lumbar Facet, Medial Branch Radiofrequency Ablation/Neurotomy  #1  Primary Purpose: Therapeutic Region: Posterolateral Lumbosacral Spine Level: L2, L3, L4, L5, & S1 Medial Branch Level(s). These levels will denervate the L3-4, L4-5, and the L5-S1 lumbar facet joints. Laterality: Right  Type: Moderate (Conscious) Sedation combined with Local Anesthesia Indication(s): Analgesia and Anxiety Route: Intravenous (IV) IV Access: Secured Sedation: Meaningful verbal contact was maintained at all times during the procedure  Local Anesthetic: Lidocaine 1-2%  Position: Prone   Indications: 1. 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 Lumbar facet hypertrophy (Bilateral)   3. Spondylosis without myelopathy or radiculopathy, lumbosacral region   4. DDD (degenerative disc disease), lumbosacral   5. Chronic low back pain (Primary Area of Pain) (Bilateral) (R>L)   6. 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 Acute postoperative  pain    Karen Edwards has been dealing with the above chronic pain for longer than three months and has either failed to respond, was unable to tolerate, or simply did not get enough benefit from other more conservative therapies including, but not limited to: 1. Over-the-counter medications 2. Anti-inflammatory medications 3. Muscle relaxants 4. Membrane stabilizers 5. Opioids 6. Physical therapy and/or chiropractic manipulation 7. Modalities (Heat, ice, etc.) 8. Invasive techniques such as nerve blocks. Karen Edwards has attained more than 50% relief of the pain from a series of diagnostic injections conducted in separate occasions.  Pain Score: Pre-procedure: 3 /10 Post-procedure: 0-No pain/10  Pre-op Assessment:  Karen Edwards is a 54 y.o. (year old), female patient, seen today for interventional treatment. She  has a past surgical history that includes Cesarean section; Ankle surgery (Right, 03/02/2014); Hemorrhoid surgery (N/A, 06/16/2015); Cervical spine surgery (02/21/2016); Cesarean section with bilateral tubal ligation (1990); Colonoscopy (04/19/2015); Hysteroscopy with D & C (12/24/2013); Colonoscopy with propofol (N/A, 05/18/2018); Esophagogastroduodenoscopy (egd) with propofol (N/A, 05/18/2018); and Spinal cord stimulator insertion (N/A, 06/18/2018). Karen Edwards has a current medication list which includes the following prescription(s): biotin, flaxseed (linseed), fluoxetine, hydrocodone-acetaminophen, [START ON 01/18/2020] hydrocodone-acetaminophen, lidocaine, methocarbamol, oxycodone, [START ON 01/18/2020] oxycodone, [START ON 02/17/2020] oxycodone, and trazodone, and the following Facility-Administered Medications: fentanyl and midazolam. Her primarily concern today is the Back Pain (right, ,lower)  Initial Vital Signs:  Pulse/HCG Rate: 74ECG Heart Rate: 67 Temp: 97.8 F (36.6 C) Resp: 16 BP: 125/85 SpO2: 99 %  BMI: Estimated body mass index is 26.45 kg/m as calculated from the  following:   Height as of this encounter: 5\' 1"  (1.549 m).   Weight as of this encounter: 140 lb (63.5 kg).  Risk Assessment: Allergies: Reviewed. She has No Known Allergies.  Allergy Precautions: None  required Coagulopathies: Reviewed. None identified.  Blood-thinner therapy: None at this time Active Infection(s): Reviewed. None identified. Karen Edwards is afebrile  Site Confirmation: Karen Edwards was asked to confirm the procedure and laterality before marking the site Procedure checklist: Completed Consent: Before the procedure and under the influence of no sedative(s), amnesic(s), or anxiolytics, the patient was informed of the treatment options, risks and possible complications. To fulfill our ethical and legal obligations, as recommended by the American Medical Association's Code of Ethics, I have informed the patient of my clinical impression; the nature and purpose of the treatment or procedure; the risks, benefits, and possible complications of the intervention; the alternatives, including doing nothing; the risk(s) and benefit(s) of the alternative treatment(s) or procedure(s); and the risk(s) and benefit(s) of doing nothing. The patient was provided information about the general risks and possible complications associated with the procedure. These may include, but are not limited to: failure to achieve desired goals, infection, bleeding, organ or nerve damage, allergic reactions, paralysis, and death. In addition, the patient was informed of those risks and complications associated to Spine-related procedures, such as failure to decrease pain; infection (i.e.: Meningitis, epidural or intraspinal abscess); bleeding (i.e.: epidural hematoma, subarachnoid hemorrhage, or any other type of intraspinal or peri-dural bleeding); organ or nerve damage (i.e.: Any type of peripheral nerve, nerve root, or spinal cord injury) with subsequent damage to sensory, motor, and/or autonomic systems, resulting  in permanent pain, numbness, and/or weakness of one or several areas of the body; allergic reactions; (i.e.: anaphylactic reaction); and/or death. Furthermore, the patient was informed of those risks and complications associated with the medications. These include, but are not limited to: allergic reactions (i.e.: anaphylactic or anaphylactoid reaction(s)); adrenal axis suppression; blood sugar elevation that in diabetics may result in ketoacidosis or comma; water retention that in patients with history of congestive heart failure may result in shortness of breath, pulmonary edema, and decompensation with resultant heart failure; weight gain; swelling or edema; medication-induced neural toxicity; particulate matter embolism and blood vessel occlusion with resultant organ, and/or nervous system infarction; and/or aseptic necrosis of one or more joints. Finally, the patient was informed that Medicine is not an exact science; therefore, there is also the possibility of unforeseen or unpredictable risks and/or possible complications that may result in a catastrophic outcome. The patient indicated having understood very clearly. We have given the patient no guarantees and we have made no promises. Enough time was given to the patient to ask questions, all of which were answered to the patient's satisfaction. Ms. Crackel has indicated that she wanted to continue with the procedure. Attestation: I, the ordering provider, attest that I have discussed with the patient the benefits, risks, side-effects, alternatives, likelihood of achieving goals, and potential problems during recovery for the procedure that I have provided informed consent. Date  Time: 01/11/2020 10:00 AM  Pre-Procedure Preparation:  Monitoring: As per clinic protocol. Respiration, ETCO2, SpO2, BP, heart rate and rhythm monitor placed and checked for adequate function Safety Precautions: Patient was assessed for positional comfort and pressure  points before starting the procedure. Time-out: I initiated and conducted the "Time-out" before starting the procedure, as per protocol. The patient was asked to participate by confirming the accuracy of the "Time Out" information. Verification of the correct person, site, and procedure were performed and confirmed by me, the nursing staff, and the patient. "Time-out" conducted as per Joint Commission's Universal Protocol (UP.01.01.01). Time: 1037  Description of Procedure:  Laterality: Right Levels:  L2, L3, L4, L5, & S1 Medial Branch Level(s), at the L3-4, L4-5, and the L5-S1 lumbar facet joints. Area Prepped: Lumbosacral Prepping solution: DuraPrep (Iodine Povacrylex [0.7% available iodine] and Isopropyl Alcohol, 74% w/w) Safety Precautions: Aspiration looking for blood return was conducted prior to all injections. At no point did we inject any substances, as a needle was being advanced. Before injecting, the patient was told to immediately notify me if she was experiencing any new onset of "ringing in the ears, or metallic taste in the mouth". No attempts were made at seeking any paresthesias. Safe injection practices and needle disposal techniques used. Medications properly checked for expiration dates. SDV (single dose vial) medications used. After the completion of the procedure, all disposable equipment used was discarded in the proper designated medical waste containers. Local Anesthesia: Protocol guidelines were followed. The patient was positioned over the fluoroscopy table. The area was prepped in the usual manner. The time-out was completed. The target area was identified using fluoroscopy. A 12-in long, straight, sterile hemostat was used with fluoroscopic guidance to locate the targets for each level blocked. Once located, the skin was marked with an approved surgical skin marker. Once all sites were marked, the skin (epidermis, dermis, and hypodermis), as well as deeper tissues  (fat, connective tissue and muscle) were infiltrated with a small amount of a short-acting local anesthetic, loaded on a 10cc syringe with a 25G, 1.5-in  Needle. An appropriate amount of time was allowed for local anesthetics to take effect before proceeding to the next step. Local Anesthetic: Lidocaine 2.0% The unused portion of the local anesthetic was discarded in the proper designated containers. Technical explanation of process:  Radiofrequency Ablation (RFA) L2 Medial Branch Nerve RFA: The target area for the L2 medial branch is at the junction of the postero-lateral aspect of the superior articular process and the superior, posterior, and medial edge of the transverse process of L3. Under fluoroscopic guidance, a Radiofrequency needle was inserted until contact was made with os over the superior postero-lateral aspect of the pedicular shadow (target area). Sensory and motor testing was conducted to properly adjust the position of the needle. Once satisfactory placement of the needle was achieved, the numbing solution was slowly injected after negative aspiration for blood. 2.0 mL of the nerve block solution was injected without difficulty or complication. After waiting for at least 3 minutes, the ablation was performed. Once completed, the needle was removed intact. L3 Medial Branch Nerve RFA: The target area for the L3 medial branch is at the junction of the postero-lateral aspect of the superior articular process and the superior, posterior, and medial edge of the transverse process of L4. Under fluoroscopic guidance, a Radiofrequency needle was inserted until contact was made with os over the superior postero-lateral aspect of the pedicular shadow (target area). Sensory and motor testing was conducted to properly adjust the position of the needle. Once satisfactory placement of the needle was achieved, the numbing solution was slowly injected after negative aspiration for blood. 2.0 mL of the nerve  block solution was injected without difficulty or complication. After waiting for at least 3 minutes, the ablation was performed. Once completed, the needle was removed intact. L4 Medial Branch Nerve RFA: The target area for the L4 medial branch is at the junction of the postero-lateral aspect of the superior articular process and the superior, posterior, and medial edge of the transverse process of L5. Under fluoroscopic guidance, a Radiofrequency needle was inserted until contact  was made with os over the superior postero-lateral aspect of the pedicular shadow (target area). Sensory and motor testing was conducted to properly adjust the position of the needle. Once satisfactory placement of the needle was achieved, the numbing solution was slowly injected after negative aspiration for blood. 2.0 mL of the nerve block solution was injected without difficulty or complication. After waiting for at least 3 minutes, the ablation was performed. Once completed, the needle was removed intact. L5 Medial Branch Nerve RFA: The target area for the L5 medial branch is at the junction of the postero-lateral aspect of the superior articular process of S1 and the superior, posterior, and medial edge of the sacral ala. Under fluoroscopic guidance, a Radiofrequency needle was inserted until contact was made with os over the superior postero-lateral aspect of the pedicular shadow (target area). Sensory and motor testing was conducted to properly adjust the position of the needle. Once satisfactory placement of the needle was achieved, the numbing solution was slowly injected after negative aspiration for blood. 2.0 mL of the nerve block solution was injected without difficulty or complication. After waiting for at least 3 minutes, the ablation was performed. Once completed, the needle was removed intact. S1 Medial Branch Nerve RFA: The target area for the S1 medial branch is located inferior to the junction of the S1 superior  articular process and the L5 inferior articular process, posterior, inferior, and lateral to the 6 o'clock position of the L5-S1 facet joint, just superior to the S1 posterior foramen. Under fluoroscopic guidance, the Radiofrequency needle was advanced until contact was made with os over the Target area. Sensory and motor testing was conducted to properly adjust the position of the needle. Once satisfactory placement of the needle was achieved, the numbing solution was slowly injected after negative aspiration for blood. 2.0 mL of the nerve block solution was injected without difficulty or complication. After waiting for at least 3 minutes, the ablation was performed. Once completed, the needle was removed intact. Radiofrequency lesioning (ablation):  Radiofrequency Generator: NeuroTherm [id number] Sensory Stimulation Parameters: 50 Hz was used to locate & identify the nerve, making sure that the needle was positioned such that there was no sensory stimulation below 0.3 V or above 0.7 V. Motor Stimulation Parameters: 2 Hz was used to evaluate the motor component. Care was taken not to lesion any nerves that demonstrated motor stimulation of the lower extremities at an output of less than 2.5 times that of the sensory threshold, or a maximum of 2.0 V. Lesioning Technique Parameters: Standard Radiofrequency settings. (Not bipolar or pulsed.) Temperature Settings: 80 degrees C Lesioning time: 60 seconds Intra-operative Compliance: Compliant Materials & Medications: Needle(s) (Electrode/Cannula) Type: Teflon-coated, curved tip, Radiofrequency needle(s) Gauge: 22G Length: 10cm Numbing solution: 0.2% PF-Ropivacaine + Triamcinolone (40 mg/mL) diluted to a final concentration of 4 mg of Triamcinolone/mL of Ropivacaine The unused portion of the solution was discarded in the proper designated containers.  Once the entire procedure was completed, the treated area was cleaned, making sure to leave some of the  prepping solution back to take advantage of its long term bactericidal properties.  Illustration of the posterior view of the lumbar spine and the posterior neural structures. Laminae of L2 through S1 are labeled. DPRL5, dorsal primary ramus of L5; DPRS1, dorsal primary ramus of S1; DPR3, dorsal primary ramus of L3; FJ, facet (zygapophyseal) joint L3-L4; I, inferior articular process of L4; LB1, lateral branch of dorsal primary ramus of L1; IAB, inferior articular branches from L3 medial  branch (supplies L4-L5 facet joint); IBP, intermediate branch plexus; MB3, medial branch of dorsal primary ramus of L3; NR3, third lumbar nerve root; S, superior articular process of L5; SAB, superior articular branches from L4 (supplies L4-5 facet joint also); TP3, transverse process of L3.  Vitals:   01/11/20 1107 01/11/20 1117 01/11/20 1127 01/11/20 1138  BP: 130/73 123/63 116/64 124/82  Pulse:      Resp: 16 16 16 16   Temp:  97.9 F (36.6 C)    TempSrc:      SpO2: 93% 96% 99% 99%  Weight:      Height:       Start Time: 1037 hrs. End Time: 1107 hrs.  Imaging Guidance (Spinal):          Type of Imaging Technique: Fluoroscopy Guidance (Spinal) Indication(s): Assistance in needle guidance and placement for procedures requiring needle placement in or near specific anatomical locations not easily accessible without such assistance. Exposure Time: Please see nurses notes. Contrast: None used. Fluoroscopic Guidance: I was personally present during the use of fluoroscopy. "Tunnel Vision Technique" used to obtain the best possible view of the target area. Parallax error corrected before commencing the procedure. "Direction-depth-direction" technique used to introduce the needle under continuous pulsed fluoroscopy. Once target was reached, antero-posterior, oblique, and lateral fluoroscopic projection used confirm needle placement in all planes. Images permanently stored in EMR. Interpretation: No contrast injected.  I personally interpreted the imaging intraoperatively. Adequate needle placement confirmed in multiple planes. Permanent images saved into the patient's record.  Antibiotic Prophylaxis:   Anti-infectives (From admission, onward)   None     Indication(s): None identified  Post-operative Assessment:  Post-procedure Vital Signs:  Pulse/HCG Rate: 7460 Temp: 97.9 F (36.6 C) Resp: 16 BP: 124/82 SpO2: 99 %  EBL: None  Complications: No immediate post-treatment complications observed by team, or reported by patient.  Note: The patient tolerated the entire procedure well. A repeat set of vitals were taken after the procedure and the patient was kept under observation following institutional policy, for this type of procedure. Post-procedural neurological assessment was performed, showing return to baseline, prior to discharge. The patient was provided with post-procedure discharge instructions, including a section on how to identify potential problems. Should any problems arise concerning this procedure, the patient was given instructions to immediately contact us, at any time, without hesitation. In any case, we plan to contact the patient by telephone for a follow-up status report regarding this interventional procedure.  Comments:  No additional relevant information.  Plan of Care  Orders:  Orders Placed This Encounter  Procedures  . Radiofrequency,Lumbar    Scheduling Instructions:     Side(s): Right-sided     Level: L3-4, L4-5, & L5-S1 Facets (L2, L3, L4, L5, & S1 Medial Branch Nerves)     Sedation: With Sedation     Timeframe: Today    Order Specific Question:   Where will this procedure be performed?    Answer:   [facility]  . [facility] C-ARM 1-60 MIN NO REPORT    Intraoperative interpretation by procedural physician at [facility].    Standing Status:   Standing    Number of Occurrences:   1    Order Specific Question:   Reason for exam:     Answer:   Assistance in needle guidance and placement for procedures requiring needle placement in or near specific anatomical locations not easily accessible without such assistance.  . Informed Consent Details: Physician/Practitioner Attestation; Transcribe  to consent form and obtain patient signature    Nursing Order: Transcribe to consent form and obtain patient signature. Note: Always confirm laterality of pain with Ms. Tye Savoy, before procedure. Procedure: Lumbar Facet Radiofrequency Ablation Indication/Reason: Low Back Pain, with our without leg pain, due to Facet Joint Arthralgia (Joint Pain) known as Lumbar Facet Syndrome, secondary to Lumbar, and/or Lumbosacral Spondylosis (Arthritis of the Spine), without myelopathy or radiculopathy (Nerve Damage). Provider Attestation: I, Fayette Dossie Arbour, MD, (Pain Management Specialist), the physician/practitioner, attest that I have discussed with the patient the benefits, risks, side effects, alternatives, likelihood of achieving goals and potential problems during recovery for the procedure that I have provided informed consent.  . Provide equipment / supplies at bedside    Equipment required: Sterile "Radiofrequency Tray"; Large hemostat (1); Small hemostat (1); Towels (6-8); 4x4 sterile sponge pack (1) Radiofrequency Needle(s): Size: Regular Quantity: 5    Standing Status:   Standing    Number of Occurrences:   1    Order Specific Question:   Specify    Answer:   Radiofrequency Tray   Chronic Opioid Analgesic:  Oxycodone IR 5 mg, 1 tab PO BID (10 mg/day of oxycodone) MME/day:15mg /day.   Medications ordered for procedure: Meds ordered this encounter  Medications  . lidocaine (XYLOCAINE) 2 % (with pres) injection 400 mg  . lactated ringers infusion 1,000 mL  . midazolam (VERSED) 5 MG/5ML injection 1-2 mg    Make sure Flumazenil is available in the pyxis when using this medication. If oversedation occurs, administer 0.2 mg IV over 15  sec. If after 45 sec no response, administer 0.2 mg again over 1 min; may repeat at 1 min intervals; not to exceed 4 doses (1 mg)  . fentaNYL (SUBLIMAZE) injection 25-50 mcg    Make sure Narcan is available in the pyxis when using this medication. In the event of respiratory depression (RR< 8/min): Titrate NARCAN (naloxone) in increments of 0.1 to 0.2 mg IV at 2-3 minute intervals, until desired degree of reversal.  . ropivacaine (PF) 2 mg/mL (0.2%) (NAROPIN) injection 9 mL  . triamcinolone acetonide (KENALOG-40) injection 40 mg  . HYDROcodone-acetaminophen (NORCO/VICODIN) 5-325 MG tablet    Sig: Take 1 tablet by mouth every 8 (eight) hours as needed for up to 7 days for severe pain. Must last 7 days.    Dispense:  21 tablet    Refill:  0    For acute post-operative pain. Not to be refilled. Must last 7 days.  Marland Kitchen HYDROcodone-acetaminophen (NORCO/VICODIN) 5-325 MG tablet    Sig: Take 1 tablet by mouth every 8 (eight) hours as needed for up to 7 days for severe pain. Must last for 7 days.    Dispense:  21 tablet    Refill:  0    For acute post-operative pain. Not to be refilled. Must last 7 days.   Medications administered: We administered lidocaine, lactated ringers, midazolam, fentaNYL, ropivacaine (PF) 2 mg/mL (0.2%), and triamcinolone acetonide.  See the medical record for exact dosing, route, and time of administration.  Follow-up plan:   Return in about 2 weeks (around 01/25/2020) for (VV), (PP).       Considering:   Possible revision of spinal cord stimulator battery implant to reposition the battery deeper. Possible right lumbar facet RFA #1 Possible bilateral SI joint RFA #1 Diagnostic bilateral IA hip joint injection Diagnostic bilateral femoral + obturator NB Possible bilateral femoral + obturator nerve RFA Diagnostic right L1-2LESI Diagnostic [id number] Diagnostic right CESI Possible bilateral  occipital nerve RFA #1 Diagnostic bilateral IA shoulder joint  injection Diagnostic bilateral suprascapular NB Possible bilateral suprascapular nerve RFA   Palliative PRN treatment(s):   Diagnostic bilateral GONB #2 Palliative bilateral cervical facet blocks #5 Palliative bilateral cervical facet RFA #2  Palliative right L2-3 interlaminar LESI #3  Palliative right L1 TFESI #2 Bilateral lumbar spinal cord stimulator trial (done)  Diagnostic bilateral lumbar facet block #4  Diagnostic bilateral SI joint block #3  Palliative left lumbar facet RFA #2 (last done 12/28/2019)     Recent Visits Date Type Provider Dept  12/28/19 Procedure visit Milinda Pointer, MD [facility]  12/14/19 Telemedicine Milinda Pointer, MD [facility]  11/30/19 Procedure visit Milinda Pointer, MD [facility]  11/24/19 Telemedicine Milinda Pointer, MD [facility]  Showing recent visits within past 90 days and meeting all other requirements   Today's Visits Date Type Provider Dept  01/11/20 Procedure visit Milinda Pointer, MD [facility]  Showing today's visits and meeting all other requirements   Future Appointments Date Type Provider Dept  01/26/20 Appointment Milinda Pointer, MD [facility]  03/15/20 Appointment Milinda Pointer, MD [facility]  Showing future appointments within next 90 days and meeting all other requirements   Disposition: Discharge home  Discharge (Date  Time): 01/11/2020; 1139 hrs.   Primary Care Physician: Pleas Koch, NP Location: [facility] Note by: Gaspar Cola, MD Date: 01/11/2020; Time: 12:18 PM  Disclaimer:  Medicine is not an Chief Strategy Officer. The only guarantee in medicine is that nothing is guaranteed. It is important to note that the decision to proceed with this intervention was based on the information collected from the patient. The Data and conclusions were drawn from the patient's questionnaire,  the interview, and the physical examination. Because the information was provided in large part by the patient, it cannot be guaranteed that it has not been purposely or unconsciously manipulated. Every effort has been made to obtain as much relevant data as possible for this evaluation. It is important to note that the conclusions that lead to this procedure are derived in large part from the available data. Always take into account that the treatment will also be dependent on availability of resources and existing treatment guidelines, considered by other Pain Management Practitioners as being common knowledge and practice, at the time of the intervention. For Medico-Legal purposes, it is also important to point out that variation in procedural techniques and pharmacological choices are the acceptable norm. The indications, contraindications, technique, and results of the above procedure should only be interpreted and judged by a Board-Certified Interventional Pain Specialist with extensive familiarity and expertise in the same exact procedure and technique.

## 2020-01-12 ENCOUNTER — Telehealth: Payer: Self-pay

## 2020-01-12 NOTE — Telephone Encounter (Signed)
Post procedure phone call. Patient states she is doing good.  

## 2020-01-25 ENCOUNTER — Encounter: Payer: Self-pay | Admitting: Pain Medicine

## 2020-01-25 NOTE — Progress Notes (Signed)
Pain relief after procedure (treated area only): (Questions asked to patient) 1. Starting about 15 minutes after the procedure, and "while the area was still numb" (from the local anesthetics), were you having any of your usual pain "in that area" (the treated area)?  (NOTE: NOT including the discomfort from the needle sticks.) First 1 hour: 80 % better. First 4-6 hours:80 % better. 2. How long did the numbness from the local anesthetics last? (More than 6 hours?) Duration:10 hours.  3. How much better is your pain now, when compared to before the procedure? Current benefit: 0 % better. 4. Can you move better now? Improvement in ROM (Range of Motion): Yes. 5. Can you do more now? Improvement in function: No. 4. Did you have any problems with the procedure? Side-effects/Complications: No.

## 2020-01-25 NOTE — Progress Notes (Signed)
Patient: Karen Edwards  Service Category: E/M  Provider: Francisco A Naveira, MD  DOB: 04/09/1966  DOS: 01/26/2020  Location: Office  MRN: [id number]  Setting: Ambulatory outpatient  Referring Provider: Clark, [name], NP  Type: Established Patient  Specialty: Interventional Pain Management  PCP: Clark, [name], NP  Location: Remote location  Delivery: TeleHealth     Virtual Encounter - Pain Management PROVIDER NOTE: Information contained herein reflects review and annotations entered in association with encounter. Interpretation of such information and data should be left to medically-trained personnel. Information provided to patient can be located elsewhere in the medical record under "Patient Instructions". Document created using STT-dictation technology, any transcriptional errors that may result from process are unintentional.    Contact & Pharmacy Preferred: [phone] Home: [phone] (home) Mobile: [phone] (mobile) E-mail: [email or web address]  [facility] - [place], [place] - [address] [place] [address] Phone: [phone] Fax: [phone]   Pre-screening  Karen Edwards offered "in-person" vs "virtual" encounter. She indicated preferring virtual for this encounter.   Reason COVID-19*  Social distancing based on CDC and AMA recommendations.   I contacted Karen Edwards on 01/26/2020 via telephone.      I clearly identified myself as Francisco A Naveira, MD. I verified that I was speaking with the correct person using two identifiers (Name: Karen Edwards, and date of birth: 02/12/1966).  Consent I sought verbal advanced consent from Karen Edwards for virtual visit interactions. I informed Karen Edwards of possible security and privacy concerns, risks, and limitations associated with providing "not-in-person" medical evaluation and management services. I also informed Karen Edwards of the availability  of "in-person" appointments. Finally, I informed her that there would be a charge for the virtual visit and that she could be  personally, fully or partially, financially responsible for it. Karen Edwards expressed understanding and agreed to proceed.   Historic Elements   Karen Edwards is a 54 y.o. year old, female patient evaluated today after her last contact with our practice on 01/12/2020. Karen Edwards  has a past medical history of Anxiety, Chronic back pain, Degenerative joint disease, Depression, Dyspnea, Headache, Hypotension, IBS (irritable bowel syndrome) (05/2015), and MVP (mitral valve prolapse). She also  has a past surgical history that includes Cesarean section; Ankle surgery (Right, 03/02/2014); Hemorrhoid surgery (N/A, 06/16/2015); Cervical spine surgery (02/21/2016); Cesarean section with bilateral tubal ligation (1990); Colonoscopy (04/19/2015); Hysteroscopy with D & C (12/24/2013); Colonoscopy with propofol (N/A, 05/18/2018); Esophagogastroduodenoscopy (egd) with propofol (N/A, 05/18/2018); and Spinal cord stimulator insertion (N/A, 06/18/2018). Karen Edwards has a current medication list which includes the following prescription(s): biotin, flaxseed (linseed), fluoxetine, lidocaine, [START ON 02/22/2020] lidocaine, methocarbamol, [START ON 03/18/2020] methocarbamol, [START ON 02/17/2020] oxycodone, [START ON 03/18/2020] oxycodone, [START ON 04/17/2020] oxycodone, and trazodone. She  reports that she has been smoking cigarettes. She has a 18.50 pack-year smoking history. She has never used smokeless tobacco. She reports current alcohol use of about 1.0 standard drinks of alcohol per week. She reports that she does not use drugs. Karen Edwards has No Known Allergies.   HPI  Today, she is being contacted for both, medication management and a post-procedure assessment.  The patient indicates doing well with the current medication regimen. No adverse reactions or side effects reported to the  medications.  The patient is well aware that it has not been 6 weeks since her radiofrequency.  However, today she mentioned that she is   having quite a bit of pain in the left lower back as a consequence of her spinal cord stimulator battery being too close to the skin.  Due to the Covid schedule cancellations, we have ended up postponing this revision for quite some time.  Today we will try to see if we can get it scheduled again, after I come back from my vacation.  I spoke to the patient about this and she was very happy to hear that we would be moving forward regarding this.  Post-Procedure Evaluation  Procedure (01/11/2020): Therapeutic right sided lumbar facet RFA #1 under fluoroscopic guidance and IV sedation (postprocedure day #14 on right side) (postprocedure day #28 on left side) Pre-procedure pain level:  3/10 Post-procedure: 0/10 (100% relief)  Sedation: Sedation provided.  Wheatley, Dena L, RN  01/25/2020 11:07 AM  Sign when Signing Visit Pain relief after procedure (treated area only): (Questions asked to patient) 1. Starting about 15 minutes after the procedure, and "while the area was still numb" (from the local anesthetics), were you having any of your usual pain "in that area" (the treated area)?  (NOTE: NOT including the discomfort from the needle sticks.) First 1 hour: 80 % better. First 4-6 hours:80 % better. 2. How long did the numbness from the local anesthetics last? (More than 6 hours?) Duration:10 hours.  3. How much better is your pain now, when compared to before the procedure? Current benefit: 0 % better. 4. Can you move better now? Improvement in ROM (Range of Motion): Yes. 5. Can you do more now? Improvement in function: No. 4. Did you have any problems with the procedure? Side-effects/Complications: No.  Current benefits: Defined as benefit that persist at this time.   Analgesia:  No significant benefit at this time.  He has been only 28 days since the  radiofrequency of the left lumbar facets.  He has only been 14 days since the radiofrequency on the right side.  Recovery from radiofrequency ablation usually should take approximately 6 weeks at its minimum.  This means approximately 42 days from 12/28/2019.  This means Mar 17, 2020. Function: No improvement ROM: Karen Edwards reports improvement in ROM  Pharmacotherapy Assessment  Analgesic: Oxycodone IR 5 mg, 1 tab PO BID (10 mg/day of oxycodone) MME/day:15mg/day.   Monitoring: [place] PMP: PDMP reviewed during this encounter.       Pharmacotherapy: No side-effects or adverse reactions reported. Compliance: No problems identified. Effectiveness: Clinically acceptable. Plan: Refer to "POC".  UDS:  Summary  Date Value Ref Range Status  04/06/2018 FINAL  Final    Comment:    ==================================================================== TOXASSURE SELECT 13 (MW) ==================================================================== Test                             Result       Flag       Units Drug Present and Declared for Prescription Verification   Norhydrocodone                 228          EXPECTED   ng/mg creat    Norhydrocodone is an expected metabolite of hydrocodone. Drug Absent but Declared for Prescription Verification   Hydrocodone                    Not Detected UNEXPECTED ng/mg creat    Hydrocodone is almost always present in patients taking this drug    consistently. Absence of hydrocodone   could be due to lapse of    time since the last dose or unusual pharmacokinetics (rapid    metabolism). ==================================================================== Test                      Result    Flag   Units      Ref Range   Creatinine              60               mg/dL      >=20 ==================================================================== Declared Medications:  The flagging and interpretation on this report are based on the  following declared medications.   Unexpected results may arise from  inaccuracies in the declared medications.  **Note: The testing scope of this panel includes these medications:  Hydrocodone (Norco)  **Note: The testing scope of this panel does not include following  reported medications:  Acetaminophen (Norco)  Atorvastatin (Lipitor)  Cyanocobalamin  Fluoxetine (Prozac)  Methocarbamol (Robaxin)  Potassium  Supplement  Trazodone (Desyrel) ==================================================================== For clinical consultation, please call [phone]. ====================================================================    Laboratory Chemistry Profile   Renal Lab Results  Component Value Date   BUN 7 09/08/2017   CREATININE 0.84 09/08/2017   BCR 8 (L) 09/08/2017   GFR 76.93 05/20/2017   GFRAA 93 09/08/2017   GFRNONAA 81 09/08/2017     Hepatic Lab Results  Component Value Date   AST 18 09/08/2017   ALT 12 (L) 01/30/2017   ALBUMIN 4.7 09/08/2017   ALKPHOS 73 09/08/2017     Electrolytes Lab Results  Component Value Date   NA 139 09/08/2017   K 4.4 09/08/2017   CL 99 09/08/2017   CALCIUM 9.8 09/08/2017   MG 2.1 09/08/2017     Bone Lab Results  Component Value Date   VD25OH 43.74 01/10/2016   25OHVITD1 51 09/08/2017   25OHVITD2 <1.0 09/08/2017   25OHVITD3 51 09/08/2017     Inflammation (CRP: Acute Phase) (ESR: Chronic Phase) Lab Results  Component Value Date   CRP 1.4 09/08/2017   ESRSEDRATE 11 09/08/2017       Note: Above Lab results reviewed.  Imaging  DG PAIN CLINIC C-ARM 1-60 MIN NO REPORT Fluoro was used, but no Radiologist interpretation will be provided.  Please refer to "NOTES" tab for provider progress note.  Assessment  The primary encounter diagnosis was Acute postoperative pain. Diagnoses of Chronic low back pain (Primary Area of Pain) (Bilateral) (R>L), Lumbar facet syndrome (Bilateral) (L>R), Lumbar facet hypertrophy (Bilateral), DDD (degenerative disc  disease), lumbosacral, Chronic pain syndrome, Chronic musculoskeletal pain, and Pain due to any device, implant or graft (SCS battery) (Left PSIS area) were also pertinent to this visit.  Plan of Care  Problem-specific:  No problem-specific Assessment & Plan notes found for this encounter.  Karen Edwards has a current medication list which includes the following long-term medication(s): fluoxetine, lidocaine, [START ON 02/22/2020] lidocaine, methocarbamol, [START ON 03/18/2020] methocarbamol, [START ON 02/17/2020] oxycodone, [START ON 03/18/2020] oxycodone, [START ON 04/17/2020] oxycodone, and trazodone.  Pharmacotherapy (Medications Ordered): Meds ordered this encounter  Medications  . oxyCODONE (OXY IR/ROXICODONE) 5 MG immediate release tablet    Sig: Take 1 tablet (5 mg total) by mouth 2 (two) times daily as needed for severe pain. Must last 30 days.    Dispense:  60 tablet    Refill:  0    Chronic Pain: STOP Act (Not applicable) Fill 1 day early if closed on refill   date. Do not fill until: 03/18/2020. To last until: 04/17/2020. Avoid benzodiazepines within 8 hours of opioids  . oxyCODONE (OXY IR/ROXICODONE) 5 MG immediate release tablet    Sig: Take 1 tablet (5 mg total) by mouth 2 (two) times daily as needed for severe pain. Must last 30 days.    Dispense:  60 tablet    Refill:  0    Chronic Pain: STOP Act (Not applicable) Fill 1 day early if closed on refill date. Do not fill until: 04/17/2020. To last until: 05/17/2020. Avoid benzodiazepines within 8 hours of opioids  . methocarbamol (ROBAXIN) 500 MG tablet    Sig: Take 2 tablets (1,000 mg total) by mouth at bedtime.    Dispense:  180 tablet    Refill:  1    Fill one day early if pharmacy is closed on scheduled refill date. May substitute for generic if available.  . lidocaine (XYLOCAINE) 5 % ointment    Sig: Apply 1 application topically daily as needed for moderate pain. Wash hands thoroughly after applying    Dispense:  150 g     Refill:  PRN    Fill one day early if pharmacy is closed on scheduled refill date. May substitute for generic if available.   Orders:  Orders Placed This Encounter  Procedures  . Neurostimulator Lead Revision & replacement    Standing Status:   Future    Standing Expiration Date:   01/25/2021   Follow-up plan:   Return in about 16 weeks (around 05/17/2020) for (VV), (MM), in addition schedule patient for spinal cord stimulator revision..      Considering:   Possible revision of spinal cord stimulator battery implant to reposition the battery deeper. Possible right lumbar facet RFA #1 Possible bilateral SI joint RFA #1 Diagnostic bilateral IA hip joint injection Diagnostic bilateral femoral + obturator NB Possible bilateral femoral + obturator nerve RFA Diagnostic right L1-2LESI Diagnostic [id number] Diagnostic right CESI Possible bilateral occipital nerve RFA #1 Diagnostic bilateral IA shoulder joint injection Diagnostic bilateral suprascapular NB Possible bilateral suprascapular nerve RFA   Palliative PRN treatment(s):   Diagnostic bilateral GONB #2 Palliative bilateral cervical facet blocks #5 Palliative bilateral cervical facet RFA #2  Palliative right L2-3 interlaminar LESI #3  Palliative right L1 TFESI #2 Bilateral lumbar spinal cord stimulator trial (done)  Diagnostic bilateral lumbar facet block #4  Diagnostic bilateral SI joint block #3  Palliative left lumbar facet RFA #2 (last done 12/28/2019)      Recent Visits Date Type Provider Dept  01/11/20 Procedure visit Milinda Pointer, MD [facility]  12/28/19 Procedure visit Milinda Pointer, MD [facility]  12/14/19 Telemedicine Milinda Pointer, [facility]  11/30/19 Procedure visit Milinda Pointer, MD [facility]  11/24/19 Telemedicine Milinda Pointer, MD [facility]  Showing recent visits within past 90 days and meeting all other  requirements   Today's Visits Date Type Provider Dept  01/26/20 Telemedicine Milinda Pointer, MD [facility]  Showing today's visits and meeting all other requirements   Future Appointments Date Type Provider Dept  03/15/20 Appointment Milinda Pointer, MD [facility]  Showing future appointments within next 90 days and meeting all other requirements   I discussed the assessment and treatment plan with the patient. The patient was provided an opportunity to ask questions and all were answered. The patient agreed with the plan and demonstrated an understanding of the instructions.  Patient advised to call back or seek an in-person evaluation  if the symptoms or condition worsens.  Duration of encounter: 15 minutes.  Note by: Francisco A Naveira, MD Date: 01/26/2020; Time: 10:34 AM 

## 2020-01-26 ENCOUNTER — Ambulatory Visit: Payer: Medicare PPO | Attending: Pain Medicine | Admitting: Pain Medicine

## 2020-01-26 ENCOUNTER — Other Ambulatory Visit: Payer: Self-pay

## 2020-01-26 DIAGNOSIS — M7918 Myalgia, other site: Secondary | ICD-10-CM

## 2020-01-26 DIAGNOSIS — M5137 Other intervertebral disc degeneration, lumbosacral region: Secondary | ICD-10-CM | POA: Diagnosis not present

## 2020-01-26 DIAGNOSIS — M47816 Spondylosis without myelopathy or radiculopathy, lumbar region: Secondary | ICD-10-CM | POA: Diagnosis not present

## 2020-01-26 DIAGNOSIS — M5442 Lumbago with sciatica, left side: Secondary | ICD-10-CM

## 2020-01-26 DIAGNOSIS — G894 Chronic pain syndrome: Secondary | ICD-10-CM

## 2020-01-26 DIAGNOSIS — M5441 Lumbago with sciatica, right side: Secondary | ICD-10-CM

## 2020-01-26 DIAGNOSIS — G8918 Other acute postprocedural pain: Secondary | ICD-10-CM

## 2020-01-26 DIAGNOSIS — T85848S Pain due to other internal prosthetic devices, implants and grafts, sequela: Secondary | ICD-10-CM

## 2020-01-26 DIAGNOSIS — G8929 Other chronic pain: Secondary | ICD-10-CM

## 2020-01-26 MED ORDER — OXYCODONE HCL 5 MG PO TABS: 5.0000 mg | ORAL_TABLET | Freq: Two times a day (BID) | ORAL | 0 refills | Status: DC | PRN

## 2020-01-26 MED ORDER — LIDOCAINE 5 % EX OINT: 1.0000 "application " | TOPICAL_OINTMENT | Freq: Every day | CUTANEOUS | 99 refills | Status: DC | PRN

## 2020-01-26 MED ORDER — METHOCARBAMOL 500 MG PO TABS: 1000.0000 mg | ORAL_TABLET | Freq: Every day | ORAL | 1 refills | Status: DC

## 2020-02-18 ENCOUNTER — Encounter: Payer: Self-pay | Admitting: Pain Medicine

## 2020-02-18 ENCOUNTER — Other Ambulatory Visit: Payer: Self-pay | Admitting: Pain Medicine

## 2020-03-06 NOTE — Progress Notes (Signed)
PROVIDER NOTE: Information contained herein reflects review and annotations entered in association with encounter. Interpretation of such information and data should be left to medically-trained personnel. Information provided to patient can be located elsewhere in the medical record under "Patient Instructions". Document created using STT-dictation technology, any transcriptional errors that may result from process are unintentional.    Patient: Karen Edwards  Service Category: E/M  Provider: Gaspar Cola, MD  DOB: 09-15-1966  DOS: 03/08/2020  Referring Provider: Pleas Koch, NP  MRN: [id number]  Setting: Ambulatory outpatient  PCP: Pleas Koch, NP  Type: Established Patient  Specialty: Interventional Pain Management    Location: Office  Delivery: Face-to-face     Primary Reason(s) for Visit: Evaluation of chronic illnesses with exacerbation, or progression (Level of risk: moderate) CC: Back Pain (lower)  HPI  Karen Edwards is a 54 y.o. year old, female patient, who comes today for a follow-up evaluation. She has Chronic low back pain (Primary Area of Pain) (Bilateral) (R>L); Menorrhagia; Fatigue; DDD (degenerative disc disease), lumbar; DDD (degenerative disc disease), cervical; Chronic occipital neuralgia (Fifth Area of Pain) (Bilateral) (R>L); Lumbar facet syndrome (Bilateral) (L>R); DDD (degenerative disc disease), lumbosacral; Lumbar radicular pain (Right) (L4); Sacroiliac joint dysfunction (Bilateral); Depression; Hemorrhoids; Insomnia; Chronic constipation; Pharmacologic therapy; Hyperlipidemia; Chronic neck pain (Fourth Area of Pain) (Bilateral) (R>L); GERD (gastroesophageal reflux disease); Numbness and tingling of upper extremity (Right); Chronic upper extremity pain (Bilateral) (R>L); Chronic sacroiliac joint pain (Bilateral) (L>R); Lumbar facet hypertrophy (Bilateral); L1-2 disc extrusion (Right); Cervical foraminal stenosis (C5-6) (Bilateral); Long term prescription  opiate use; Opiate use; Disorder of skeletal system; Problems influencing health status; Chronic pain syndrome; Chronic cervical radicular pain (Right: C6/C7) (Left: C5/T1); Chronic lumbar radicular pain; Chronic hip pain (Tertiary Area of Pain) (Bilateral) (R>L); Chronic lower extremity pain (Secondary Area of Pain) (Bilateral) (R>L); Chronic shoulder pain (Bilateral) (L>R); Chronic musculoskeletal pain; Lumbar spondylosis; Stress incontinence of urine; Cervical facet syndrome (Bilateral) (R>L); Myofascial pain; Spondylosis without myelopathy or radiculopathy, cervical region; Cervicalgia; Nausea and vomiting; History of colonic polyps; S/P insertion of Epidural Neurostimulator (SCS) (Bilateral); MVP (mitral valve prolapse); Multiple nevi; Chronic neck pain with history of cervical spinal surgery; 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[facility]); Preop testing; Trigger point with back pain (Left); Cervicogenic headache (Bilateral); Occipital headache (Bilateral); Chronic tension-type headache, intractable; Cervico-occipital neuralgia (Bilateral); Pain due to any device, implant or graft (SCS battery) (Left PSIS area); Spondylosis without myelopathy or radiculopathy, lumbosacral region; History of cervical spinal surgery (C5-7 ACDF); Acute postoperative pain; and Rectal bleeding on their problem list. Karen Edwards was last seen on 01/11/2020. Her primarily concern today is the Back Pain (lower)  Pain Assessment: Location: Lower Back Radiating: both upper legs Onset: More than a month ago Duration: Chronic pain Quality: Aching, Burning, Sharp, Stabbing Severity: 5 /10 (subjective, self-reported pain score)  Note: Reported level is compatible with observation.  Timing: Intermittent Modifying factors: changing positions BP: (!) 136/59  HR: 73  Further details on both, my assessment(s), as well as the proposed treatment plan, please see below. She is here for preoperative evaluation prior to spinal cord  stimulator battery revision.  Today I took the time to go over the procedure including the risk and possible complications.  I also review her medications and put the orders for the surgery.  Interestingly, the patient indicated that she has not received any of the oxycodone IR 5 mg pills that I have been prescribing for her.  I went into the PMP and confirmed that  that was the case.  I then went to the order section and confirmed that we had sent the prescriptions to her [facility] in [place].  We have electronic evidence of them having received the prescriptions and yet she states that they have not provided her with any oxycodone prescriptions.  I showed the patient the information and the confirmation and she stated that she will be asking them for the medicine and see if they can explain what is going on.  I told the patient that if there was any kind of problems to have the pharmacy call us so that we can contact them and find out what is going on.  She agreed.  The patient was informed what her level of activity should be after the surgery.  I sent a prescription for some postoperative antibiotics to her pharmacy and I also sent another prescription for acute postop pain.  Physical exam today was within normal limits.  She is clear to auscultation.  No rales or rhonchi.  No carotid bruits.  Regular rate and rhythm.  Afebrile with no evidence of infection around the surgical site.  Pharmacotherapy Assessment  Analgesic: Oxycodone IR 5 mg, 1 tab PO BID (10 mg/day of oxycodone) MME/day:4m/day.   Monitoring: [place] PMP: PDMP reviewed during this encounter.       Pharmacotherapy: No side-effects or adverse reactions reported. Compliance: No problems identified. Effectiveness: Clinically acceptable. Plan: Refer to "POC".  UDS:  Summary  Date Value Ref Range Status  04/06/2018 FINAL  Final    Comment:    ==================================================================== TOXASSURE SELECT  13 (MW) ==================================================================== Test                             Result       Flag       Units Drug Present and Declared for Prescription Verification   Norhydrocodone                 228          EXPECTED   ng/mg creat    Norhydrocodone is an expected metabolite of hydrocodone. Drug Absent but Declared for Prescription Verification   Hydrocodone                    Not Detected UNEXPECTED ng/mg creat    Hydrocodone is almost always present in patients taking this drug    consistently. Absence of hydrocodone could be due to lapse of    time since the last dose or unusual pharmacokinetics (rapid    metabolism). ==================================================================== Test                      Result    Flag   Units      Ref Range   Creatinine              60               mg/dL      >=20 ==================================================================== Declared Medications:  The flagging and interpretation on this report are based on the  following declared medications.  Unexpected results may arise from  inaccuracies in the declared medications.  **Note: The testing scope of this panel includes these medications:  Hydrocodone (Norco)  **Note: The testing scope of this panel does not include following  reported medications:  Acetaminophen (Norco)  Atorvastatin (Lipitor)  Cyanocobalamin  Fluoxetine (Prozac)  Methocarbamol (Robaxin)  Potassium  Supplement  Trazodone (Desyrel) ==================================================================== For clinical consultation, please call [phone]. ====================================================================    Laboratory Chemistry Profile   Renal Lab Results  Component Value Date   BUN 7 09/08/2017   CREATININE 0.84 09/08/2017   BCR 8 (L) 09/08/2017   GFR 76.93 05/20/2017   GFRAA 93 09/08/2017   GFRNONAA 81 09/08/2017   PROTEINUR NEGATIVE 01/30/2017      Electrolytes Lab Results  Component Value Date   NA 139 09/08/2017   K 4.4 09/08/2017   CL 99 09/08/2017   CALCIUM 9.8 09/08/2017   MG 2.1 09/08/2017     Hepatic Lab Results  Component Value Date   AST 18 09/08/2017   ALT 12 (L) 01/30/2017   ALBUMIN 4.7 09/08/2017   ALKPHOS 73 09/08/2017     ID Lab Results  Component Value Date   SARSCOV2NAA NEGATIVE 04/30/2019   STAPHAUREUS NEGATIVE 06/11/2018   MRSAPCR NEGATIVE 06/11/2018   PREGTESTUR NEGATIVE 06/16/2015     Bone Lab Results  Component Value Date   VD25OH 43.74 01/10/2016   25OHVITD1 51 09/08/2017   25OHVITD2 <1.0 09/08/2017   25OHVITD3 51 09/08/2017     Endocrine Lab Results  Component Value Date   GLUCOSE 90 09/08/2017   GLUCOSEU NEGATIVE 01/30/2017   TSH 1.18 01/10/2016     Neuropathy Lab Results  Component Value Date   VITAMINB12 >2000 (H) 09/08/2017     CNS No results found for: COLORCSF, APPEARCSF, RBCCOUNTCSF, WBCCSF, POLYSCSF, LYMPHSCSF, EOSCSF, PROTEINCSF, GLUCCSF, JCVIRUS, CSFOLI, IGGCSF, LABACHR, ACETBL, LABACHR, ACETBL   Inflammation (CRP: Acute  ESR: Chronic) Lab Results  Component Value Date   CRP 1.4 09/08/2017   ESRSEDRATE 11 09/08/2017     Rheumatology No results found for: RF, ANA, LABURIC, URICUR, LYMEIGGIGMAB, LYMEABIGMQN, HLAB27   Coagulation Lab Results  Component Value Date   PLT 202.0 05/20/2017     Cardiovascular Lab Results  Component Value Date   TROPONINI <0.03 01/30/2017   HGB 13.5 05/20/2017   HCT 40.5 05/20/2017     Screening Lab Results  Component Value Date   SARSCOV2NAA NEGATIVE 04/30/2019   COVIDSOURCE NASOPHARYNGEAL 03/26/2019   STAPHAUREUS NEGATIVE 06/11/2018   MRSAPCR NEGATIVE 06/11/2018   PREGTESTUR NEGATIVE 06/16/2015     Cancer No results found for: CEA, CA125, LABCA2   Allergens No results found for: ALMOND, APPLE, ASPARAGUS, AVOCADO, BANANA, BARLEY, BASIL, BAYLEAF, GREENBEAN, LIMABEAN, WHITEBEAN, BEEFIGE, REDBEET, BLUEBERRY, BROCCOLI,  CABBAGE, MELON, CARROT, CASEIN, CASHEWNUT, CAULIFLOWER, CELERY     Note: Lab results reviewed.  Imaging Review  Cervical Imaging: Cervical MR wo contrast:  Results for orders placed during the hospital encounter of 12/31/19  MR CERVICAL SPINE WO CONTRAST   Narrative CLINICAL DATA:  Neck pain radiating to both arms.  EXAM: MRI CERVICAL SPINE WITHOUT CONTRAST  TECHNIQUE: Multiplanar, multisequence MR imaging of the cervical spine was performed. No intravenous contrast was administered.  COMPARISON:  MRI of the cervical spine January 14, 2018  FINDINGS: Alignment: Straightening of the cervical curvature.  Vertebrae: Status post anterior discectomy and fusion from C5 through C7 with facet fusion at C4-5 and C6-7. No aggressive bone lesion identified.  Cord: Normal signal and morphology.  Posterior Fossa, vertebral arteries, paraspinal tissues: Negative.  Disc levels:  C2-3: No spinal canal or neural foraminal stenosis.  C3-4: Uncovertebral and facet degenerative changes resulting in mild narrowing of the right neural foramen. There is no spinal canal stenosis.  C4-5: There is a tiny posterior disc protrusion causing small indentation of the thecal sac without significant spinal  canal stenosis. There uncovertebral and facet degenerative changes resulting in mild narrowing of right neural foramen.  C5-6: Uncovertebral degenerative changes result in mild narrowing of the right neural foramen. There is no spinal canal stenosis.  C6-7: No spinal canal or neural foraminal stenosis.  C7-T1: Small posterior disc protrusion causes mild indentation of the thecal sac without significant spinal canal stenosis. There is no neural foraminal narrowing.  When compared to prior study, mild neural foraminal narrowing at C3-4 and C4-5 are new.  IMPRESSION: 1. Status post anterior discectomy and fusion from C5 through C7 with mild multilevel degenerative changes of the cervical  spine. 2. No high-grade spinal canal stenosis. Mild neural foraminal narrowing at C3-4, C4-5 and C5-6.   Electronically Signed   By: Pedro Earls M.D.   On: 12/31/2019 11:45    Cervical DG Bending/F/E views:  Results for orders placed during the hospital encounter of 01/01/18  DG Cervical Spine With Flex & Extend   Narrative CLINICAL DATA:  Chronic radicular cervical pain.  EXAM: CERVICAL SPINE COMPLETE WITH FLEXION AND EXTENSION VIEWS  COMPARISON:  CT scan of Mar 23, 2014.  FINDINGS: Status post surgical anterior fusion of C5-6 and C6-7. Mild degenerative disc disease is noted at C4-5 with anterior osteophyte formation. No prevertebral soft tissue swelling is noted. No acute fracture or spondylolisthesis is noted. No change in vertebral body alignment is noted on flexion or extension views. No significant neural foraminal stenosis is noted.  IMPRESSION: Status post surgical anterior fusion of C5-6 and C6-7. No acute abnormality seen in the cervical spine.   Electronically Signed   By: Marijo Conception, M.D.   On: 01/01/2018 16:42    Thoracic Imaging: Thoracic DG w/swimmers view:  Results for orders placed in visit on 05/05/17  [facility] Thoracic Spine W/Swimmers   Narrative CLINICAL DATA:  Chronic right chest wall pain.  No known injury.  EXAM: THORACIC SPINE - 3 VIEWS  COMPARISON:  Chest x-ray 01/30/2017  FINDINGS: There is no evidence of thoracic spine fracture. Alignment is normal. No other significant bone abnormalities are identified.  IMPRESSION: Negative.   Electronically Signed   By: Rolm Baptise M.D.   On: 05/06/2017 08:12    Lumbosacral Imaging: Lumbar MR wo contrast:  Results for orders placed during the hospital encounter of 01/14/18  MR LUMBAR SPINE WO CONTRAST   Narrative CLINICAL DATA:  Chronic low back pain with burning and catching sensation. Bilateral leg pain, right worse than left. No recent injury.  EXAM: MRI LUMBAR  SPINE WITHOUT CONTRAST  TECHNIQUE: Multiplanar, multisequence MR imaging of the lumbar spine was performed. No intravenous contrast was administered.  COMPARISON:  MRI lumbar spine 02/20/2016.  FINDINGS: Segmentation:  Standard.  Alignment:  Maintained.  Vertebrae: A few small hemangiomas are seen. No fracture or worrisome lesion.  Conus medullaris and cauda equina: Conus extends to the T12-L1 level. Conus and cauda equina appear normal.  Paraspinal and other soft tissues: Negative.  Disc levels:  T11-12 is imaged in the sagittal plane only and negative.  T12-L1: Negative.  L1-2: Shallow central protrusion with cephalad extension is unchanged. The central canal and foramina are widely patent.  L2-3: Minimal disc bulge and mild facet degenerative disease without stenosis.  L3-4: Minimal disc bulge and mild facet degenerative change. No stenosis.  L4-5: Minimal disc bulge and mild facet arthropathy without stenosis.  L5-S1: Negative.  IMPRESSION: No change in the appearance of the lumbar spine. No new abnormality since the prior exam.  Mild degenerative disease without stenosis most notable at L1-2 where there is a shallow central disc protrusion with cephalad extension.   Electronically Signed   By: Inge Rise M.D.   On: 01/14/2018 20:44    Lumbar MR w/wo contrast:  Results for orders placed during the hospital encounter of 12/03/12  MR Lumbar Spine W Wo Contrast   Narrative *RADIOLOGY REPORT*  Clinical Data: Chronic low back pain with left sided radicular symptoms.  No reported relevant prior surgery.  MRI LUMBAR SPINE WITHOUT AND WITH CONTRAST  Technique:  Multiplanar and multiecho pulse sequences of the lumbar spine were obtained without and with intravenous contrast.  Contrast: 35m MULTIHANCE GADOBENATE DIMEGLUMINE 529 MG/ML IV SOLN  Comparison: Lumbar spine radiographs 10/22/2012.  Findings: Radiographs demonstrate five lumbar type  vertebral bodies.  The alignment is normal.  There is no evidence of fracture or pars defect.  Scattered tiny hemangiomas are noted.  The conus medullaris extends to the T12-L1 level and appears normal. There are no paraspinal abnormalities. Sagittal images demonstrate probable retroversion of the uterus.  There is a small subserosal uterine fibroid as well as central low density in the uterus which may reflect a larger fibroid.  This is only partially imaged on the current examination.  L1-L2:  Small right paracentral disc protrusion.  No spinal stenosis or nerve root encroachment.  L2-L3:  Mild disc bulging.  No spinal stenosis or nerve root encroachment.  L3-L4:  Disc height and hydration are maintained.  There is mild facet and ligamentous hypertrophy.  No spinal stenosis or nerve root encroachment.  L4-L5:  Mild disc bulging with mild facet and ligamentous hypertrophy.  No spinal stenosis or nerve root encroachment.  L5-S1:  Disc height and hydration are maintained.  No spinal stenosis or nerve root encroachment.  IMPRESSION:  1.  Small right paracentral disc protrusion at L1-L2. 2.  Mild disc bulging and facet disease from L2-L3 through L4-L5. 3.  No significant spinal stenosis or nerve root encroachment. 4.  Suspected uterine fibroids, only partially imaged on the current examination.   Original Report Authenticated By: WRichardean Sale M.D.    Lumbar DG (Complete) 4+V:  Results for orders placed during the hospital encounter of 10/22/12  DG Lumbar Spine Complete   Narrative *RADIOLOGY REPORT*  Clinical Data: Back pain.  LUMBAR SPINE - COMPLETE 4+ VIEW  Comparison: None  Findings: The lateral film demonstrates normal alignment. Vertebral bodies and disc spaces are maintained.  No acute bony findings.  Normal alignment of the facet joints and no pars defects.  The visualized bony pelvis in intact.  IMPRESSION: Normal alignment and no acute bony findings or  degenerative changes.   Original Report Authenticated By: PMarijo Sanes M.D.          Lumbar DG Bending views:  Results for orders placed during the hospital encounter of 09/08/17  DG Lumbar Spine Complete W/Bend   Narrative CLINICAL DATA:  Chronic pain, pain clinic referral.  EXAM: LUMBAR SPINE - COMPLETE WITH BENDING VIEWS  COMPARISON:  Lumbar spine MRI of February 20, 2016  FINDINGS: The lumbar vertebral bodies are preserved in height. The pedicles and transverse processes are intact. The disc space heights are well maintained. There is no spondylolisthesis. There is no significant facet joint hypertrophy. There is no evidence of ligamentous instability as the patient moves from the neutral to the flexed and to the extended positions. The observed portions of the sacrum are normal.  IMPRESSION: There is no acute or significant chronic  bony abnormality of the lumbar spine.   Electronically Signed   By: David  Martinique M.D.   On: 09/08/2017 15:40          Sacroiliac Joint Imaging: Sacroiliac Joint DG:  Results for orders placed during the hospital encounter of 09/08/17  DG Si Joints   Narrative CLINICAL DATA:  Chronic pain in multiple areas of the body. No injury.  EXAM: BILATERAL SACROILIAC JOINTS - 3+ VIEW  COMPARISON:  None.  FINDINGS: The sacroiliac joint spaces are maintained and there is no evidence of arthropathy. No other bone abnormalities are seen.  IMPRESSION: Negative.   Electronically Signed   By: Staci Righter M.D.   On: 09/08/2017 15:42    Hip Imaging: Hip-R DG 2-3 views:  Results for orders placed during the hospital encounter of 09/08/17  DG HIP UNILAT W OR W/O PELVIS 2-3 VIEWS RIGHT   Narrative CLINICAL DATA:  Chronic pelvic and hip pain. No report of injury. Pain center referral.  EXAM: DG HIP (WITH OR WITHOUT PELVIS) 2-3V RIGHT; DG HIP (WITH OR WITHOUT PELVIS) 2-3V LEFT  COMPARISON:  None in PACs  FINDINGS: The bony pelvis is  subjectively adequately mineralized. There is no lytic or blastic lesion. The sacrum and SI joints appear normal.  AP and lateral views of both hips reveal preservation of the joint spaces. The articular surfaces of the femoral heads and acetabuli remains smoothly rounded. The femoral necks, intertrochanteric, and subtrochanteric regions are normal. The overlying soft tissues are normal.  IMPRESSION: There is no acute or significant chronic bony abnormality of the pelvis or hips.   Electronically Signed   By: David  Martinique M.D.   On: 09/08/2017 15:42    Hip-L DG 2-3 views:  Results for orders placed during the hospital encounter of 09/08/17  DG HIP UNILAT W OR W/O PELVIS 2-3 VIEWS LEFT   Narrative CLINICAL DATA:  Chronic pelvic and hip pain. No report of injury. Pain center referral.  EXAM: DG HIP (WITH OR WITHOUT PELVIS) 2-3V RIGHT; DG HIP (WITH OR WITHOUT PELVIS) 2-3V LEFT  COMPARISON:  None in PACs  FINDINGS: The bony pelvis is subjectively adequately mineralized. There is no lytic or blastic lesion. The sacrum and SI joints appear normal.  AP and lateral views of both hips reveal preservation of the joint spaces. The articular surfaces of the femoral heads and acetabuli remains smoothly rounded. The femoral necks, intertrochanteric, and subtrochanteric regions are normal. The overlying soft tissues are normal.  IMPRESSION: There is no acute or significant chronic bony abnormality of the pelvis or hips.   Electronically Signed   By: David  Martinique M.D.   On: 09/08/2017 15:42    Elbow Imaging: Elbow-L DG Complete:  Results for orders placed in visit on 05/05/17  DG Elbow Complete Left   Narrative CLINICAL DATA:  Chronic left elbow pain.  No known injury.  EXAM: LEFT ELBOW - COMPLETE 3+ VIEW  COMPARISON:  None.  FINDINGS: There is no evidence of fracture, dislocation, or joint effusion. There is no evidence of arthropathy or other focal bone  abnormality. Soft tissues are unremarkable.  IMPRESSION: Negative.   Electronically Signed   By: Rolm Baptise M.D.   On: 05/06/2017 08:12    Complexity Note: Imaging results reviewed. Results shared with Ms. Tye Savoy, using [employer].                        Meds   Current Outpatient Medications:  .  acetaminophen (TYLENOL) 500 MG tablet, Take 1,000 mg by mouth every 6 (six) hours as needed (for pain.)., Disp: , Rfl:  .  Biotin 5000 MCG TABS, Take 5,000 mcg by mouth daily., Disp: , Rfl:  .  FLUoxetine (PROZAC) 20 MG capsule, Take 3 capsules (60 mg total) by mouth daily., Disp: 270 capsule, Rfl: 3 .  lidocaine (XYLOCAINE) 5 % ointment, Apply 1 application topically daily as needed for moderate pain. Wash hands thoroughly after applying, Disp: 150 g, Rfl: PRN .  [START ON 03/18/2020] methocarbamol (ROBAXIN) 500 MG tablet, Take 2 tablets (1,000 mg total) by mouth at bedtime., Disp: 180 tablet, Rfl: 1 .  Omega-3 Fatty Acids (FISH OIL) 1200 MG CAPS, Take 1,200 mg by mouth daily., Disp: , Rfl:  .  ondansetron (ZOFRAN-ODT) 4 MG disintegrating tablet, Take 4 mg by mouth every 4 (four) hours as needed (vertigo/dizziness). , Disp: , Rfl:  .  oxyCODONE (OXY IR/ROXICODONE) 5 MG immediate release tablet, Take 1 tablet (5 mg total) by mouth 2 (two) times daily as needed for severe pain. Must last 30 days., Disp: 60 tablet, Rfl: 0 .  [START ON 03/18/2020] oxyCODONE (OXY IR/ROXICODONE) 5 MG immediate release tablet, Take 1 tablet (5 mg total) by mouth 2 (two) times daily as needed for severe pain. Must last 30 days., Disp: 60 tablet, Rfl: 0 .  [START ON 04/17/2020] oxyCODONE (OXY IR/ROXICODONE) 5 MG immediate release tablet, Take 1 tablet (5 mg total) by mouth 2 (two) times daily as needed for severe pain. Must last 30 days., Disp: 60 tablet, Rfl: 0 .  scopolamine (TRANSDERM-SCOP) 1 MG/3DAYS, Place 1 patch onto the skin daily as needed (vertigo/dizziness). , Disp: , Rfl:  .  traZODone (DESYREL) 100 MG  tablet, Take 3 tablets (300 mg total) by mouth at bedtime. For sleep., Disp: 270 tablet, Rfl: 3 .  TURMERIC PO, Take 1 capsule by mouth daily., Disp: , Rfl:  .  cephALEXin (KEFLEX) 500 MG capsule, Take 1 capsule (500 mg total) by mouth 4 (four) times daily for 7 days., Disp: 28 capsule, Rfl: 0 .  [START ON 03/13/2020] HYDROcodone-acetaminophen (NORCO/VICODIN) 5-325 MG tablet, Take 1 tablet by mouth every 6 (six) hours as needed for up to 7 days for severe pain. Must last 7 days., Disp: 28 tablet, Rfl: 0 .  [START ON 03/20/2020] HYDROcodone-acetaminophen (NORCO/VICODIN) 5-325 MG tablet, Take 1 tablet by mouth every 6 (six) hours as needed for up to 7 days for severe pain. Must last 7 days., Disp: 28 tablet, Rfl: 0  Current Facility-Administered Medications:  .  [START ON 03/14/2020] fentaNYL (SUBLIMAZE) injection 25-50 mcg, 25-50 mcg, Intravenous, Q5 min PRN, Milinda Pointer, MD .  Derrill Memo ON 03/14/2020] lidocaine (XYLOCAINE) 2 % (with pres) injection 400 mg, 20 mL, Infiltration, Once, Milinda Pointer, MD .  Derrill Memo ON 03/14/2020] midazolam (VERSED) bolus via infusion 1-2 mg, 1-2 mg, Intravenous, Q5 min PRN, Milinda Pointer, MD .  Derrill Memo ON 03/14/2020] ropivacaine (PF) 2 mg/mL (0.2%) (NAROPIN) injection 20 mL, 20 mL, Infiltration, Once, Milinda Pointer, MD .  Derrill Memo ON 03/14/2020] sodium chloride flush (NS) 0.9 % injection 10 mL, 10 mL, Other, Once, Milinda Pointer, MD  ROS  Constitutional: Denies any fever or chills Gastrointestinal: No reported hemesis, hematochezia, vomiting, or acute GI distress Musculoskeletal: Denies any acute onset joint swelling, redness, loss of ROM, or weakness Neurological: No reported episodes of acute onset apraxia, aphasia, dysarthria, agnosia, amnesia, paralysis, loss of coordination, or loss of consciousness  Allergies  Ms. Trafton has No  Known Allergies.  [place]  Drug: Ms. Shehan  reports no history of drug use. Alcohol:  reports current alcohol use of  about 1.0 standard drinks of alcohol per week. Tobacco:  reports that she has been smoking cigarettes. She has a 18.50 pack-year smoking history. She has never used smokeless tobacco. Medical:  has a past medical history of Anxiety, Chronic back pain, Degenerative joint disease, Depression, Headache, Hypotension, IBS (irritable bowel syndrome) (05/2015), and MVP (mitral valve prolapse). Surgical: Ms. Lymon  has a past surgical history that includes Cesarean section; Ankle surgery (Right, 03/02/2014); Hemorrhoid surgery (N/A, 06/16/2015); Cervical spine surgery (02/21/2016); Cesarean section with bilateral tubal ligation (1990); Colonoscopy (04/19/2015); Hysteroscopy with D & C (12/24/2013); Colonoscopy with propofol (N/A, 05/18/2018); Esophagogastroduodenoscopy (egd) with propofol (N/A, 05/18/2018); Spinal cord stimulator insertion (N/A, 06/18/2018); and Tubal ligation. Family: family history includes Arthritis in her mother; Congestive Heart Failure in her maternal grandfather; Depression in her mother; Diabetes in her maternal aunt; Diabetes Mellitus I in her maternal grandmother; Gout in her maternal aunt; Hearing loss in her father; Heart attack in her maternal grandfather; Hyperlipidemia in her father and mother; Hypertension in her father and mother; Hypothyroidism in her mother; Kidney disease in her mother; Prostate cancer in her paternal grandfather; Stroke in her maternal grandmother; Thyroid disease in her maternal aunt; Vision loss in her mother.  Constitutional Exam  General appearance: Well nourished, well developed, and well hydrated. In no apparent acute distress Vitals:   03/08/20 1320  BP: (!) 136/59  Pulse: 73  Resp: 16  Temp: (!) 97.4 F (36.3 C)  TempSrc: Temporal  SpO2: 99%  Weight: 140 lb (63.5 kg)  Height: 5' (1.524 m)   BMI Assessment: Estimated body mass index is 27.34 kg/m as calculated from the following:   Height as of this encounter: 5' (1.524 m).   Weight as of  this encounter: 140 lb (63.5 kg).  BMI interpretation table: BMI level Category Range association with higher incidence of chronic pain  <18 kg/m2 Underweight   18.5-24.9 kg/m2 Ideal body weight   25-29.9 kg/m2 Overweight Increased incidence by 20%  30-34.9 kg/m2 Obese (Class I) Increased incidence by 68%  35-39.9 kg/m2 Severe obesity (Class II) Increased incidence by 136%  >40 kg/m2 Extreme obesity (Class III) Increased incidence by 254%   Patient's current BMI Ideal Body weight  Body mass index is 27.34 kg/m. Ideal body weight: 45.5 kg (100 lb 4.9 oz) Adjusted ideal body weight: 52.7 kg (116 lb 3 oz)   BMI Readings from Last 4 Encounters:  03/08/20 27.34 kg/m  03/08/20 26.45 kg/m  01/11/20 26.45 kg/m  12/28/19 26.45 kg/m   Wt Readings from Last 4 Encounters:  03/08/20 140 lb (63.5 kg)  03/08/20 140 lb (63.5 kg)  01/11/20 140 lb (63.5 kg)  12/28/19 140 lb (63.5 kg)   Psych/Mental status: Alert, oriented x 3 (person, place, & time)       Eyes: PERLA Respiratory: No evidence of any respiratory distress.  The patient is clear to auscultation with no rales or rhonchi.  Cervical Spine Exam  Skin & Axial Inspection: No masses, redness, edema, swelling, or associated skin lesions Alignment: Symmetrical Functional ROM: Unrestricted ROM      Stability: No instability detected Muscle Tone/Strength: Functionally intact. No obvious neuro-muscular anomalies detected. Sensory (Neurological): Unimpaired Palpation: No palpable anomalies              Upper Extremity (UE) Exam    Side: Right upper extremity  Side: Left upper extremity  Skin & Extremity Inspection: Skin color, temperature, and hair growth are WNL. No peripheral edema or cyanosis. No masses, redness, swelling, asymmetry, or associated skin lesions. No contractures.  Skin & Extremity Inspection: Skin color, temperature, and hair growth are WNL. No peripheral edema or cyanosis. No masses, redness, swelling, asymmetry, or  associated skin lesions. No contractures.  Functional ROM: Unrestricted ROM          Functional ROM: Unrestricted ROM          Muscle Tone/Strength: Functionally intact. No obvious neuro-muscular anomalies detected.  Muscle Tone/Strength: Functionally intact. No obvious neuro-muscular anomalies detected.  Sensory (Neurological): Unimpaired          Sensory (Neurological): Unimpaired          Palpation: No palpable anomalies              Palpation: No palpable anomalies              Provocative Test(s):  Phalen's test: deferred Tinel's test: deferred Apley's scratch test (touch opposite shoulder):  Action 1 (Across chest): deferred Action 2 (Overhead): deferred Action 3 (LB reach): deferred   Provocative Test(s):  Phalen's test: deferred Tinel's test: deferred Apley's scratch test (touch opposite shoulder):  Action 1 (Across chest): deferred Action 2 (Overhead): deferred Action 3 (LB reach): deferred    Thoracic Spine Area Exam  Skin & Axial Inspection: No masses, redness, or swelling Alignment: Symmetrical Functional ROM: Unrestricted ROM Stability: No instability detected Muscle Tone/Strength: Functionally intact. No obvious neuro-muscular anomalies detected. Sensory (Neurological): Unimpaired Muscle strength & Tone: No palpable anomalies  Lumbar Exam  Skin & Axial Inspection: Well healed scar from previous spine surgery detected Alignment: Symmetrical Functional ROM: Decreased ROM affecting both sides Stability: No instability detected Muscle Tone/Strength: Functionally intact. No obvious neuro-muscular anomalies detected. Sensory (Neurological): Unimpaired Palpation: Some tenderness detected around the area of the neurostimulator battery, especially close to the scar.  The device can be easily palpated through the scar.  This is probably contributing for the tenderness in the area.       Provocative Tests: Hyperextension/rotation test: deferred today       Lumbar quadrant  test (Kemp's test): deferred today       Lateral bending test: deferred today       Patrick's Maneuver: deferred today                   FABER* test: deferred today                   S-I anterior distraction/compression test: deferred today         S-I lateral compression test: deferred today         S-I Thigh-thrust test: deferred today         S-I Gaenslen's test: deferred today         *(Flexion, ABduction and External Rotation)  Gait & Posture Assessment  Ambulation: Unassisted Gait: Relatively normal for age and body habitus Posture: WNL   Lower Extremity Exam    Side: Right lower extremity  Side: Left lower extremity  Stability: No instability observed          Stability: No instability observed          Skin & Extremity Inspection: Skin color, temperature, and hair growth are WNL. No peripheral edema or cyanosis. No masses, redness, swelling, asymmetry, or associated skin lesions. No contractures.  Skin & Extremity Inspection: Skin color, temperature, and hair growth are WNL. No peripheral  edema or cyanosis. No masses, redness, swelling, asymmetry, or associated skin lesions. No contractures.  Functional ROM: Unrestricted ROM                  Functional ROM: Unrestricted ROM                  Muscle Tone/Strength: Functionally intact. No obvious neuro-muscular anomalies detected.  Muscle Tone/Strength: Functionally intact. No obvious neuro-muscular anomalies detected.  Sensory (Neurological): Unimpaired        Sensory (Neurological): Unimpaired        DTR: Patellar: deferred today Achilles: deferred today Plantar: deferred today  DTR: Patellar: deferred today Achilles: deferred today Plantar: deferred today  Palpation: No palpable anomalies  Palpation: No palpable anomalies   Assessment   Status Diagnosis  Persistent Deteriorating Persistent 1. Pain due to any device, implant or graft (SCS battery) (Left PSIS area)   2. S/P insertion of Epidural Neurostimulator (SCS)  (Bilateral)   3. Chronic low back pain (Primary Area of Pain) (Bilateral) (R>L)   4. Chronic hip pain (Tertiary Area of Pain) (Bilateral) (R>L)   5. Chronic pain syndrome   6. MVP (mitral valve prolapse)   7. Acute postoperative pain      Updated Problems: No problems updated.  Plan of Care  Pharmacotherapy (Medications Ordered): Meds ordered this encounter  Medications  . cephALEXin (KEFLEX) 500 MG capsule    Sig: Take 1 capsule (500 mg total) by mouth 4 (four) times daily for 7 days.    Dispense:  28 capsule    Refill:  0    Do not place medication on "Automatic Refill". Fill one day early if pharmacy is closed on scheduled refill date.  Marland Kitchen HYDROcodone-acetaminophen (NORCO/VICODIN) 5-325 MG tablet    Sig: Take 1 tablet by mouth every 6 (six) hours as needed for up to 7 days for severe pain. Must last 7 days.    Dispense:  28 tablet    Refill:  0    For acute post-operative pain. Not to be refilled. Must last 7 days.  Marland Kitchen HYDROcodone-acetaminophen (NORCO/VICODIN) 5-325 MG tablet    Sig: Take 1 tablet by mouth every 6 (six) hours as needed for up to 7 days for severe pain. Must last 7 days.    Dispense:  28 tablet    Refill:  0    For acute post-operative pain. Not to be refilled.  Must last 7 days.  Marland Kitchen lidocaine (XYLOCAINE) 2 % (with pres) injection 400 mg  . midazolam (VERSED) bolus via infusion 1-2 mg    Make sure Flumazenil is available in the pyxis when using this medication. If oversedation occurs, administer 0.2 mg IV over 15 sec. If after 45 sec no response, administer 0.2 mg again over 1 min; may repeat at 1 min intervals; not to exceed 4 doses (1 mg)  . fentaNYL (SUBLIMAZE) injection 25-50 mcg    Make sure Narcan is available in the pyxis when using this medication. In the event of respiratory depression (RR< 8/min): Titrate NARCAN (naloxone) in increments of 0.1 to 0.2 mg IV at 2-3 minute intervals, until desired degree of reversal.  . ropivacaine (PF) 2 mg/mL (0.2%)  (NAROPIN) injection 20 mL  . sodium chloride flush (NS) 0.9 % injection 10 mL    This needs to be preservative-free.   Medications administered today: Missy L. Sakuma had no medications administered during this visit.  Orders:  Orders Placed This Encounter  Procedures  . [place]  1-60 MIN NO REPORT    Intraoperative interpretation by procedural physician at [place].    Standing Status:   Standing    Number of Occurrences:   1    Order Specific Question:   Reason for exam:    Answer:   Assistance in needle guidance and placement for procedures requiring needle placement in or near specific anatomical locations not easily accessible without such assistance.  . Provide equipment / supplies at bedside    Epidural Tray (x1) Sterile towel pack (x2) Small streight hemostat (x2) Large hemostat (x1) Sutures (cutting needle) 0-silk Needle holder (x1) Scissors (Mayo) (x1) 3/4" Steri-strip pack (x2) 4x4 Gauze Pack (x1) Large and Medium size sterile transparent dressings    Standing Status:   Standing    Number of Occurrences:   1    Order Specific Question:   Specify    Answer:   Epidural Tray & Minor Surgery Tray  . Neurostimulator Battery replacement    Standing Status:   Standing    Number of Occurrences:   1   Lab Orders  No laboratory test(s) ordered today    Imaging Orders     [facility] C-ARM 1-60 MIN NO REPORT Referral Orders  No referral(s) requested today   Planned follow-up:   Return in about 6 days (around 03/14/2020) for Surgery for neurostimulator battery revision.      Considering:   Possible revision of spinal cord stimulator battery implant to reposition the battery deeper. Possible right lumbar facet RFA #1 Possible bilateral SI joint RFA #1 Diagnostic bilateral IA hip joint injection Diagnostic bilateral femoral + obturator NB Possible bilateral femoral + obturator nerve RFA Diagnostic right L1-2LESI Diagnostic  [id number] Diagnostic right CESI Possible bilateral occipital nerve RFA #1 Diagnostic bilateral IA shoulder joint injection Diagnostic bilateral suprascapular NB Possible bilateral suprascapular nerve RFA   Palliative PRN treatment(s):   Diagnostic bilateral GONB #2 Palliative bilateral cervical facet blocks #5 Palliative bilateral cervical facet RFA #2  Palliative right L2-3 interlaminar LESI #3  Palliative right L1 TFESI #2 Bilateral lumbar spinal cord stimulator trial (done)  Diagnostic bilateral lumbar facet block #4  Diagnostic bilateral SI joint block #3  Palliative left lumbar facet RFA #2 (last done 12/28/2019)       Recent Visits Date Type Provider Dept  01/26/20 Telemedicine Milinda Pointer, [facility]  01/11/20 Procedure visit Milinda Pointer, MD [facility]  12/28/19 Procedure visit Milinda Pointer, MD [facility]  12/14/19 Telemedicine Milinda Pointer, MD [facility]  Showing recent visits within past 90 days and meeting all other requirements   Today's Visits Date Type Provider Dept  03/08/20 Office Visit Milinda Pointer, MD [facility]  Showing today's visits and meeting all other requirements   Future Appointments Date Type Provider Dept  03/23/20 Appointment Milinda Pointer, MD [facility]  05/03/20 Appointment Milinda Pointer, MD [facility]  05/17/20 Appointment Milinda Pointer, MD [facility]  Showing future appointments within next 90 days and meeting all other requirements   Primary Care Physician: Pleas Koch, NP Location: [facility] Note by: Gaspar Cola, MD Date: 03/08/2020; Time: 2:40 PM  Note: This dictation was prepared with Dragon dictation. Any transcriptional errors that may result from this process are unintentional.

## 2020-03-08 ENCOUNTER — Ambulatory Visit (HOSPITAL_BASED_OUTPATIENT_CLINIC_OR_DEPARTMENT_OTHER): Payer: Medicare PPO | Admitting: Pain Medicine

## 2020-03-08 ENCOUNTER — Encounter
Admission: RE | Admit: 2020-03-08 | Discharge: 2020-03-08 | Disposition: A | Discharge status: Home / Self Care | Payer: Medicare PPO | Source: Ambulatory Visit | Attending: Pain Medicine | Admitting: Pain Medicine

## 2020-03-08 ENCOUNTER — Other Ambulatory Visit: Payer: Self-pay

## 2020-03-08 ENCOUNTER — Encounter: Payer: Self-pay | Admitting: Pain Medicine

## 2020-03-08 VITALS — BP 136/59 | HR 73 | Temp 97.4°F | Resp 16 | Ht 60.0 in | Wt 140.0 lb

## 2020-03-08 DIAGNOSIS — G8929 Other chronic pain: Secondary | ICD-10-CM | POA: Diagnosis not present

## 2020-03-08 DIAGNOSIS — G8918 Other acute postprocedural pain: Secondary | ICD-10-CM

## 2020-03-08 DIAGNOSIS — G894 Chronic pain syndrome: Secondary | ICD-10-CM

## 2020-03-08 DIAGNOSIS — T85848S Pain due to other internal prosthetic devices, implants and grafts, sequela: Secondary | ICD-10-CM | POA: Diagnosis not present

## 2020-03-08 DIAGNOSIS — M25551 Pain in right hip: Secondary | ICD-10-CM

## 2020-03-08 DIAGNOSIS — M25552 Pain in left hip: Secondary | ICD-10-CM

## 2020-03-08 DIAGNOSIS — I341 Nonrheumatic mitral (valve) prolapse: Secondary | ICD-10-CM

## 2020-03-08 DIAGNOSIS — Z9689 Presence of other specified functional implants: Secondary | ICD-10-CM

## 2020-03-08 DIAGNOSIS — M5442 Lumbago with sciatica, left side: Secondary | ICD-10-CM | POA: Diagnosis not present

## 2020-03-08 DIAGNOSIS — M5441 Lumbago with sciatica, right side: Secondary | ICD-10-CM

## 2020-03-08 MED ORDER — FENTANYL CITRATE (PF) 100 MCG/2ML IJ SOLN: 25.0000 ug | INTRAMUSCULAR | Status: DC | PRN

## 2020-03-08 MED ORDER — ROPIVACAINE HCL 2 MG/ML IJ SOLN: 20.0000 mL | Freq: Once | INTRAMUSCULAR | Status: DC

## 2020-03-08 MED ORDER — CEPHALEXIN 500 MG PO CAPS: 500.0000 mg | ORAL_CAPSULE | Freq: Four times a day (QID) | ORAL | 0 refills | Status: AC

## 2020-03-08 MED ORDER — MIDAZOLAM BOLUS VIA INFUSION
1.0000 mg | INTRAVENOUS | Status: DC | PRN
  Filled 2020-03-08: qty 2

## 2020-03-08 MED ORDER — LIDOCAINE HCL 2 % IJ SOLN: 20.0000 mL | Freq: Once | INTRAMUSCULAR | Status: DC

## 2020-03-08 MED ORDER — HYDROCODONE-ACETAMINOPHEN 5-325 MG PO TABS: 1.0000 | ORAL_TABLET | Freq: Four times a day (QID) | ORAL | 0 refills | Status: DC | PRN

## 2020-03-08 MED ORDER — SODIUM CHLORIDE 0.9% FLUSH: 10.0000 mL | Freq: Once | INTRAVENOUS | Status: DC

## 2020-03-08 NOTE — Patient Instructions (Addendum)
COVID TESTING Date: Friday, May 14 Testing site:  [facility] Once you are tested, you are asked to stay quarantined (avoiding public places) until after your surgery.   Your procedure is scheduled on: Tuesday, May 18 Report to Day Surgery on the 2nd floor of the [facility]. To find out your arrival time, please call [phone] between 1PM - 3PM on: Monday, May 17  REMEMBER: Instructions that are not followed completely may result in serious medical risk, up to and including death; or upon the discretion of your surgeon and anesthesiologist your surgery may need to be rescheduled.  Do not eat food after midnight the night before surgery.  No gum chewing, lozengers or hard candies.  You may however, drink CLEAR liquids up to 2 hours before you are scheduled to arrive for your surgery. Do not drink anything within 2 hours of your scheduled arrival time.  Clear liquids include: - water  - apple juice without pulp - gatorade (not RED) - black coffee or tea (Do NOT add milk or creamers to the coffee or tea) Do NOT drink anything that is not on this list.  TAKE THESE MEDICATIONS THE MORNING OF SURGERY WITH A SIP OF WATER:  1.  Fluoxetine 2.  Oxycodone OR tylenol if needed for pain  Stop Anti-inflammatories (NSAIDS) such as Advil, Aleve, Ibuprofen, Motrin, Naproxen, Naprosyn and Aspirin based products such as Excedrin, Goodys Powder, BC Powder. (May take Tylenol or Acetaminophen if needed.)  Stop ANY OVER THE COUNTER supplements until after surgery. (turmeric, fish oil) (May continue Vitamin B.)  No Alcohol for 24 hours before or after surgery.  No Smoking including e-cigarettes for 24 hours prior to surgery.  No chewable tobacco products for at least 6 hours prior to surgery.  No nicotine patches on the day of surgery.  On the morning of surgery brush your teeth with toothpaste and water, you may rinse your mouth with mouthwash if you wish. Do not swallow any toothpaste or  mouthwash.  Do not wear jewelry, make-up, hairpins, clips or nail polish.  Do not wear lotions, powders, or perfumes.   Do not shave 48 hours prior to surgery.   Contact lenses, hearing aids and dentures may not be worn into surgery.  Do not bring valuables to the hospital. [facility] is not responsible for any missing/lost belongings or valuables.   Shower using antibacterial soap the morning of surgery.  Notify your doctor if there is any change in your medical condition (cold, fever, infection).  Wear comfortable clothing (specific to your surgery type) to the hospital.  Plan for stool softeners for home use; pain medications have a tendency to cause constipation. You can also help prevent constipation by eating foods high in fiber such as fruits and vegetables and drinking plenty of fluids as your diet allows.  After surgery, you can help prevent lung complications by doing breathing exercises.  Take deep breaths and cough every 1-2 hours. Your doctor may order a device called an Incentive Spirometer to help you take deep breaths.  If you are being discharged the day of surgery, you will not be allowed to drive home. You will need a responsible adult (18 years or older) to drive you home and stay with you that night.   If you are taking public transportation, you will need to have a responsible adult (18 years or older) with you. Please confirm with your physician that it is acceptable to use public transportation.   Please call  the Pre-admissions Testing Dept. at [phone] if you have any questions about these instructions.  Visitation Policy:  Patients undergoing a surgery or procedure may have one family member or support person with them as long as that person is not COVID-19 positive or experiencing its symptoms.  That person may remain in the waiting area during the procedure.

## 2020-03-08 NOTE — Patient Instructions (Signed)
____________________________________________________________________________________________  Preparing for Procedure with Sedation  Procedure appointments are limited to planned procedures: . No Prescription Refills. . No disability issues will be discussed. . No medication changes will be discussed.  Instructions: . Oral Intake: Do not eat or drink anything for at least 3 hours prior to your procedure. (Exception: Blood Pressure Medication. See below.) . Transportation: Unless otherwise stated by your physician, you may drive yourself after the procedure. . Blood Pressure Medicine: Do not forget to take your blood pressure medicine with a sip of water the morning of the procedure. If your Diastolic (lower reading)is above 100 mmHg, elective cases will be cancelled/rescheduled. . Blood thinners: These will need to be stopped for procedures. Notify our staff if you are taking any blood thinners. Depending on which one you take, there will be specific instructions on how and when to stop it. . Diabetics on insulin: Notify the staff so that you can be scheduled 1st case in the morning. If your diabetes requires high dose insulin, take only  of your normal insulin dose the morning of the procedure and notify the staff that you have done so. . Preventing infections: Shower with an antibacterial soap the morning of your procedure. . Build-up your immune system: Take 1000 mg of Vitamin C with every meal (3 times a day) the day prior to your procedure. . Antibiotics: Inform the staff if you have a condition or reason that requires you to take antibiotics before dental procedures. . Pregnancy: If you are pregnant, call and cancel the procedure. . Sickness: If you have a cold, fever, or any active infections, call and cancel the procedure. . Arrival: You must be in the facility at least 30 minutes prior to your scheduled procedure. . Children: Do not bring children with you. . Dress appropriately:  Bring dark clothing that you would not mind if they get stained. . Valuables: Do not bring any jewelry or valuables.  Reasons to call and reschedule or cancel your procedure: (Following these recommendations will minimize the risk of a serious complication.) . Surgeries: Avoid having procedures within 2 weeks of any surgery. (Avoid for 2 weeks before or after any surgery). . Flu Shots: Avoid having procedures within 2 weeks of a flu shots or . (Avoid for 2 weeks before or after immunizations). . Barium: Avoid having a procedure within 7-10 days after having had a radiological study involving the use of radiological contrast. (Myelograms, Barium swallow or enema study). . Heart attacks: Avoid any elective procedures or surgeries for the initial 6 months after a "Myocardial Infarction" (Heart Attack). . Blood thinners: It is imperative that you stop these medications before procedures. Let us know if you if you take any blood thinner.  . Infection: Avoid procedures during or within two weeks of an infection (including chest colds or gastrointestinal problems). Symptoms associated with infections include: Localized redness, fever, chills, night sweats or profuse sweating, burning sensation when voiding, cough, congestion, stuffiness, runny nose, sore throat, diarrhea, nausea, vomiting, cold or Flu symptoms, recent or current infections. It is specially important if the infection is over the area that we intend to treat. . Heart and lung problems: Symptoms that may suggest an active cardiopulmonary problem include: cough, chest pain, breathing difficulties or shortness of breath, dizziness, ankle swelling, uncontrolled high or unusually low blood pressure, and/or palpitations. If you are experiencing any of these symptoms, cancel your procedure and contact your primary care physician for an evaluation.  Remember:  Regular Business hours are:    Monday to Thursday 8:00 AM to 4:00 PM  Provider's  Schedule: Kaelan Amble, MD:  Procedure days: Tuesday and Thursday 7:30 AM to 4:00 PM  Bilal Lateef, MD:  Procedure days: Monday and Wednesday 7:30 AM to 4:00 PM ____________________________________________________________________________________________   ____________________________________________________________________________________________  General Risks and Possible Complications  Patient Responsibilities: It is important that you read this as it is part of your informed consent. It is our duty to inform you of the risks and possible complications associated with treatments offered to you. It is your responsibility as a patient to read this and to ask questions about anything that is not clear or that you believe was not covered in this document.  Patient's Rights: You have the right to refuse treatment. You also have the right to change your mind, even after initially having agreed to have the treatment done. However, under this last option, if you wait until the last second to change your mind, you may be charged for the materials used up to that point.  Introduction: Medicine is not an exact science. Everything in Medicine, including the lack of treatment(s), carries the potential for danger, harm, or loss (which is by definition: Risk). In Medicine, a complication is a secondary problem, condition, or disease that can aggravate an already existing one. All treatments carry the risk of possible complications. The fact that a side effects or complications occurs, does not imply that the treatment was conducted incorrectly. It must be clearly understood that these can happen even when everything is done following the highest safety standards.  No treatment: You can choose not to proceed with the proposed treatment alternative. The "PRO(s)" would include: avoiding the risk of complications associated with the therapy. The "CON(s)" would include: not getting any of the treatment  benefits. These benefits fall under one of three categories: diagnostic; therapeutic; and/or palliative. Diagnostic benefits include: getting information which can ultimately lead to improvement of the disease or symptom(s). Therapeutic benefits are those associated with the successful treatment of the disease. Finally, palliative benefits are those related to the decrease of the primary symptoms, without necessarily curing the condition (example: decreasing the pain from a flare-up of a chronic condition, such as incurable terminal cancer).  General Risks and Complications: These are associated to most interventional treatments. They can occur alone, or in combination. They fall under one of the following six (6) categories: no benefit or worsening of symptoms; bleeding; infection; nerve damage; allergic reactions; and/or death. 1. No benefits or worsening of symptoms: In Medicine there are no guarantees, only probabilities. No healthcare provider can ever guarantee that a medical treatment will work, they can only state the probability that it may. Furthermore, there is always the possibility that the condition may worsen, either directly, or indirectly, as a consequence of the treatment. 2. Bleeding: This is more common if the patient is taking a blood thinner, either prescription or over the counter (example: Goody Powders, Fish oil, Aspirin, Garlic, etc.), or if suffering a condition associated with impaired coagulation (example: Hemophilia, cirrhosis of the liver, low platelet counts, etc.). However, even if you do not have one on these, it can still happen. If you have any of these conditions, or take one of these drugs, make sure to notify your treating physician. 3. Infection: This is more common in patients with a compromised immune system, either due to disease (example: diabetes, cancer, human immunodeficiency virus [HIV], etc.), or due to medications or treatments (example: therapies used to treat  cancer and   rheumatological diseases). However, even if you do not have one on these, it can still happen. If you have any of these conditions, or take one of these drugs, make sure to notify your treating physician. 4. Nerve Damage: This is more common when the treatment is an invasive one, but it can also happen with the use of medications, such as those used in the treatment of cancer. The damage can occur to small secondary nerves, or to large primary ones, such as those in the spinal cord and brain. This damage may be temporary or permanent and it may lead to impairments that can range from temporary numbness to permanent paralysis and/or brain death. 5. Allergic Reactions: Any time a substance or material comes in contact with our body, there is the possibility of an allergic reaction. These can range from a mild skin rash (contact dermatitis) to a severe systemic reaction (anaphylactic reaction), which can result in death. 6. Death: In general, any medical intervention can result in death, most of the time due to an unforeseen complication. ____________________________________________________________________________________________   

## 2020-03-08 NOTE — Progress Notes (Signed)
Safety precautions to be maintained throughout the outpatient stay will include: orient to surroundings, keep bed in low position, maintain call bell within reach at all times, provide assistance with transfer out of bed and ambulation.  

## 2020-03-10 ENCOUNTER — Other Ambulatory Visit: Payer: Self-pay

## 2020-03-10 ENCOUNTER — Other Ambulatory Visit (HOSPITAL_COMMUNITY)
Admission: RE | Admit: 2020-03-10 | Discharge: 2020-03-10 | Disposition: A | Discharge status: Home / Self Care | Payer: Medicare PPO | Source: Ambulatory Visit | Attending: Pain Medicine | Admitting: Pain Medicine

## 2020-03-10 ENCOUNTER — Other Ambulatory Visit: Admission: RE | Admit: 2020-03-10 | Payer: Medicare PPO | Source: Ambulatory Visit

## 2020-03-10 DIAGNOSIS — Z01812 Encounter for preprocedural laboratory examination: Secondary | ICD-10-CM | POA: Insufficient documentation

## 2020-03-10 DIAGNOSIS — Z20822 Contact with and (suspected) exposure to covid-19: Secondary | ICD-10-CM | POA: Diagnosis not present

## 2020-03-10 LAB — SARS CORONAVIRUS 2 (TAT 6-24 HRS): SARS Coronavirus 2: NEGATIVE

## 2020-03-14 ENCOUNTER — Ambulatory Visit: Payer: Medicare PPO | Admitting: Certified Registered"

## 2020-03-14 ENCOUNTER — Encounter
Admission: RE | Disposition: A | Discharge status: Home / Self Care | Payer: Self-pay | Source: Home / Self Care | Attending: Pain Medicine

## 2020-03-14 ENCOUNTER — Ambulatory Visit
Admission: RE | Admit: 2020-03-14 | Discharge: 2020-03-14 | Disposition: A | Discharge status: Home / Self Care | Payer: Medicare PPO | Attending: Pain Medicine | Admitting: Pain Medicine

## 2020-03-14 ENCOUNTER — Encounter: Payer: Self-pay | Admitting: Pain Medicine

## 2020-03-14 ENCOUNTER — Other Ambulatory Visit: Payer: Self-pay

## 2020-03-14 DIAGNOSIS — M5442 Lumbago with sciatica, left side: Secondary | ICD-10-CM | POA: Diagnosis not present

## 2020-03-14 DIAGNOSIS — Y752 Prosthetic and other implants, materials and neurological devices associated with adverse incidents: Secondary | ICD-10-CM | POA: Insufficient documentation

## 2020-03-14 DIAGNOSIS — J449 Chronic obstructive pulmonary disease, unspecified: Secondary | ICD-10-CM | POA: Diagnosis not present

## 2020-03-14 DIAGNOSIS — I341 Nonrheumatic mitral (valve) prolapse: Secondary | ICD-10-CM | POA: Insufficient documentation

## 2020-03-14 DIAGNOSIS — T85848A Pain due to other internal prosthetic devices, implants and grafts, initial encounter: Secondary | ICD-10-CM

## 2020-03-14 DIAGNOSIS — Z981 Arthrodesis status: Secondary | ICD-10-CM | POA: Diagnosis not present

## 2020-03-14 DIAGNOSIS — F17210 Nicotine dependence, cigarettes, uncomplicated: Secondary | ICD-10-CM | POA: Diagnosis not present

## 2020-03-14 DIAGNOSIS — Z4542 Encounter for adjustment and management of neuropacemaker (brain) (peripheral nerve) (spinal cord): Secondary | ICD-10-CM | POA: Insufficient documentation

## 2020-03-14 DIAGNOSIS — M25551 Pain in right hip: Secondary | ICD-10-CM

## 2020-03-14 DIAGNOSIS — M1990 Unspecified osteoarthritis, unspecified site: Secondary | ICD-10-CM | POA: Insufficient documentation

## 2020-03-14 DIAGNOSIS — G8929 Other chronic pain: Secondary | ICD-10-CM

## 2020-03-14 DIAGNOSIS — M25552 Pain in left hip: Secondary | ICD-10-CM

## 2020-03-14 DIAGNOSIS — T85840A Pain due to nervous system prosthetic devices, implants and grafts, initial encounter: Secondary | ICD-10-CM | POA: Insufficient documentation

## 2020-03-14 DIAGNOSIS — Z9689 Presence of other specified functional implants: Secondary | ICD-10-CM | POA: Diagnosis not present

## 2020-03-14 DIAGNOSIS — Z79891 Long term (current) use of opiate analgesic: Secondary | ICD-10-CM | POA: Diagnosis not present

## 2020-03-14 DIAGNOSIS — G894 Chronic pain syndrome: Secondary | ICD-10-CM | POA: Insufficient documentation

## 2020-03-14 DIAGNOSIS — F419 Anxiety disorder, unspecified: Secondary | ICD-10-CM | POA: Diagnosis not present

## 2020-03-14 DIAGNOSIS — Z79899 Other long term (current) drug therapy: Secondary | ICD-10-CM | POA: Insufficient documentation

## 2020-03-14 DIAGNOSIS — M545 Low back pain: Secondary | ICD-10-CM | POA: Insufficient documentation

## 2020-03-14 DIAGNOSIS — T85848S Pain due to other internal prosthetic devices, implants and grafts, sequela: Secondary | ICD-10-CM | POA: Diagnosis present

## 2020-03-14 DIAGNOSIS — F329 Major depressive disorder, single episode, unspecified: Secondary | ICD-10-CM | POA: Diagnosis not present

## 2020-03-14 DIAGNOSIS — M5441 Lumbago with sciatica, right side: Secondary | ICD-10-CM

## 2020-03-14 DIAGNOSIS — M79604 Pain in right leg: Secondary | ICD-10-CM | POA: Diagnosis present

## 2020-03-14 HISTORY — PX: LUMBAR SPINAL CORD SIMULATOR REVISION: SHX6811

## 2020-03-14 HISTORY — PX: LUMBAR SPINAL CORD STIMULATOR REVISION: SHX6811

## 2020-03-14 LAB — SURGICAL PCR SCREEN
MRSA, PCR: NEGATIVE
Staphylococcus aureus: NEGATIVE

## 2020-03-14 SURGERY — LUMBAR SPINAL CORD SIMULATOR REVISION
Anesthesia: Monitor Anesthesia Care

## 2020-03-14 MED ORDER — FENTANYL CITRATE (PF) 100 MCG/2ML IJ SOLN
INTRAMUSCULAR | Status: DC | PRN
  Administered 2020-03-14: 100 ug via INTRAVENOUS

## 2020-03-14 MED ORDER — FENTANYL CITRATE (PF) 100 MCG/2ML IJ SOLN: 25.0000 ug | INTRAMUSCULAR | Status: DC | PRN

## 2020-03-14 MED ORDER — PROPOFOL 10 MG/ML IV BOLUS
INTRAVENOUS | Status: AC
  Filled 2020-03-14: qty 20

## 2020-03-14 MED ORDER — BACITRACIN ZINC 500 UNIT/GM EX OINT
TOPICAL_OINTMENT | CUTANEOUS | Status: AC
  Filled 2020-03-14: qty 28.35

## 2020-03-14 MED ORDER — OXYCODONE HCL 5 MG PO TABS: 5.0000 mg | ORAL_TABLET | Freq: Once | ORAL | Status: DC | PRN

## 2020-03-14 MED ORDER — LACTATED RINGERS IV SOLN: 1000.0000 mL | Freq: Once | INTRAVENOUS | Status: DC

## 2020-03-14 MED ORDER — ONDANSETRON HCL 4 MG/2ML IJ SOLN: 4.0000 mg | Freq: Once | INTRAMUSCULAR | Status: DC | PRN

## 2020-03-14 MED ORDER — MIDAZOLAM HCL 2 MG/2ML IJ SOLN
INTRAMUSCULAR | Status: AC
  Filled 2020-03-14: qty 2

## 2020-03-14 MED ORDER — FAMOTIDINE 20 MG PO TABS: 20.0000 mg | ORAL_TABLET | Freq: Once | ORAL | Status: AC

## 2020-03-14 MED ORDER — MIDAZOLAM HCL 2 MG/2ML IJ SOLN
INTRAMUSCULAR | Status: DC | PRN
  Administered 2020-03-14: 2 mg via INTRAVENOUS

## 2020-03-14 MED ORDER — FENTANYL CITRATE (PF) 100 MCG/2ML IJ SOLN
INTRAMUSCULAR | Status: AC
  Filled 2020-03-14: qty 2

## 2020-03-14 MED ORDER — FAMOTIDINE 20 MG PO TABS
ORAL_TABLET | ORAL | Status: AC
  Administered 2020-03-14: 20 mg via ORAL
  Filled 2020-03-14: qty 1

## 2020-03-14 MED ORDER — PROPOFOL 500 MG/50ML IV EMUL
INTRAVENOUS | Status: AC
  Filled 2020-03-14: qty 50

## 2020-03-14 MED ORDER — CEFAZOLIN SODIUM-DEXTROSE 1-4 GM/50ML-% IV SOLN
INTRAVENOUS | Status: AC
  Filled 2020-03-14: qty 50

## 2020-03-14 MED ORDER — LACTATED RINGERS IV SOLN
INTRAVENOUS | Status: DC
  Administered 2020-03-14: 50 mL/h via INTRAVENOUS

## 2020-03-14 MED ORDER — ONDANSETRON HCL 4 MG/2ML IJ SOLN
INTRAMUSCULAR | Status: DC | PRN
  Administered 2020-03-14: 4 mg via INTRAVENOUS

## 2020-03-14 MED ORDER — LIDOCAINE HCL 1 % IJ SOLN
INTRAMUSCULAR | Status: DC | PRN
  Administered 2020-03-14: 10 mL
  Administered 2020-03-14: 3 mL

## 2020-03-14 MED ORDER — CEFAZOLIN SODIUM-DEXTROSE 1-4 GM/50ML-% IV SOLN
1.0000 g | Freq: Once | INTRAVENOUS | Status: AC
  Administered 2020-03-14: 1 g via INTRAVENOUS

## 2020-03-14 MED ORDER — OXYCODONE HCL 5 MG/5ML PO SOLN: 5.0000 mg | Freq: Once | ORAL | Status: DC | PRN

## 2020-03-14 MED ORDER — BUPIVACAINE HCL 0.5 % IJ SOLN
INTRAMUSCULAR | Status: DC | PRN
  Administered 2020-03-14: 10 mL
  Administered 2020-03-14: 3 mL

## 2020-03-14 MED ORDER — BUPIVACAINE HCL (PF) 0.5 % IJ SOLN
INTRAMUSCULAR | Status: AC
  Filled 2020-03-14: qty 30

## 2020-03-14 MED ORDER — BACITRACIN ZINC 500 UNIT/GM EX OINT
TOPICAL_OINTMENT | CUTANEOUS | Status: DC | PRN
  Administered 2020-03-14: 1 via TOPICAL

## 2020-03-14 MED ORDER — PROPOFOL 500 MG/50ML IV EMUL
INTRAVENOUS | Status: DC | PRN
  Administered 2020-03-14: 80 ug/kg/min via INTRAVENOUS

## 2020-03-14 MED ORDER — SODIUM CHLORIDE (PF) 0.9 % IJ SOLN
INTRAMUSCULAR | Status: AC
  Filled 2020-03-14: qty 100

## 2020-03-14 MED ORDER — ACETAMINOPHEN 10 MG/ML IV SOLN: 1000.0000 mg | Freq: Once | INTRAVENOUS | Status: DC | PRN

## 2020-03-14 MED ORDER — ONDANSETRON HCL 4 MG/2ML IJ SOLN
INTRAMUSCULAR | Status: AC
  Filled 2020-03-14: qty 2

## 2020-03-14 MED ORDER — LIDOCAINE HCL (PF) 1 % IJ SOLN
INTRAMUSCULAR | Status: AC
  Filled 2020-03-14: qty 30

## 2020-03-14 SURGICAL SUPPLY — 36 items
BLADE SURG 15 STRL LF DISP TIS (BLADE) ×1 IMPLANT
BLADE SURG 15 STRL SS (BLADE) ×2
BOOT SUTURE AID YELLOW STND (SUTURE) IMPLANT
DRAPE C-ARM XRAY 36X54 (DRAPES) IMPLANT
DRAPE CAMERA CLOSED 9X96 (DRAPES) IMPLANT
DRAPE INCISE IOBAN 66X45 STRL (DRAPES) ×3 IMPLANT
DRSG TEGADERM 4X4.75 (GAUZE/BANDAGES/DRESSINGS) ×3 IMPLANT
DRSG TELFA 3X8 NADH (GAUZE/BANDAGES/DRESSINGS) ×3 IMPLANT
DURAPREP 26ML APPLICATOR (WOUND CARE) ×3 IMPLANT
ELECT REM PT RETURN 9FT ADLT (ELECTROSURGICAL) ×3
ELECTRODE REM PT RTRN 9FT ADLT (ELECTROSURGICAL) ×1 IMPLANT
GLOVE BIO SURGEON STRL SZ8 (GLOVE) ×3 IMPLANT
GLOVE SURG XRAY 8.5 LX (GLOVE) IMPLANT
GOWN STRL REUS W/ TWL LRG LVL3 (GOWN DISPOSABLE) ×1 IMPLANT
GOWN STRL REUS W/ TWL XL LVL3 (GOWN DISPOSABLE) ×1 IMPLANT
GOWN STRL REUS W/TWL LRG LVL3 (GOWN DISPOSABLE) ×2
GOWN STRL REUS W/TWL XL LVL3 (GOWN DISPOSABLE) ×2
KIT TURNOVER KIT A (KITS) ×3 IMPLANT
LABEL OR SOLS (LABEL) IMPLANT
NEEDLE HYPO 25X1 1.5 SAFETY (NEEDLE) ×3 IMPLANT
NEEDLE SPNL 22GX3.5 QUINCKE BK (NEEDLE) IMPLANT
PACK BASIN MINOR (MISCELLANEOUS) ×3 IMPLANT
PACK UNIVERSAL (MISCELLANEOUS) IMPLANT
SOL PREP PVP 2OZ (MISCELLANEOUS) ×3
SOLUTION PREP PVP 2OZ (MISCELLANEOUS) ×1 IMPLANT
STAPLER SKIN PROX 35W (STAPLE) ×3 IMPLANT
SUT SILK 0 SH 30 (SUTURE) IMPLANT
SUT SILK 2 0 SH (SUTURE) IMPLANT
SUT VIC AB 2-0 SH 27 (SUTURE) ×4
SUT VIC AB 2-0 SH 27XBRD (SUTURE) ×2 IMPLANT
SWABSTK COMLB BENZOIN TINCTURE (MISCELLANEOUS) IMPLANT
SYR 10ML LL (SYRINGE) ×3 IMPLANT
SYR 3ML LL SCALE MARK (SYRINGE) ×3 IMPLANT
SYR EPIDURAL 5ML GLASS (SYRINGE) IMPLANT
SYR TB 1ML 27GX1/2 LL (SYRINGE) IMPLANT
WATER STERILE IRR 1000ML POUR (IV SOLUTION) ×3 IMPLANT

## 2020-03-14 NOTE — Anesthesia Preprocedure Evaluation (Signed)
Anesthesia Evaluation  Patient identified by MRN, date of birth, ID band Patient awake    Reviewed: Allergy & Precautions, H&P , NPO status , Patient's Chart, lab work & pertinent test results  History of Anesthesia Complications Negative for: history of anesthetic complications  Airway Mallampati: II  TM Distance: <3 FB Neck ROM: limited    Dental no notable dental hx. (+) Chipped, Poor Dentition, Missing   Pulmonary shortness of breath and with exertion, COPD, Current SmokerPatient did not abstain from smoking.,    Pulmonary exam normal breath sounds clear to auscultation       Cardiovascular Exercise Tolerance: Good (-) hypertension(-) CAD and (-) Past MI negative cardio ROS   Rhythm:Regular Rate:Normal - Systolic murmurs    Neuro/Psych  Headaches, PSYCHIATRIC DISORDERS Anxiety Depression  Neuromuscular disease    GI/Hepatic Neg liver ROS, GERD  Medicated and Controlled,  Endo/Other  negative endocrine ROS  Renal/GU      Musculoskeletal  (+) Arthritis ,   Abdominal   Peds  Hematology negative hematology ROS (+)   Anesthesia Other Findings Past Medical History: No date: Anxiety No date: Chronic back pain No date: Degenerative joint disease No date: Depression No date: Dyspnea No date: Hypotension 05/2015: IBS (irritable bowel syndrome) No date: MVP (mitral valve prolapse)  Past Surgical History: 03/02/2014: ANKLE SURGERY; Right     Comment:  repair of tendon and sural nerve right ankle 02/21/2016: CERVICAL SPINE SURGERY     Comment:  arthrodesis of anterior cervical spine (fusion of               C5-6,C6-7 and insertion of bone allograft No date: CESAREAN SECTION 1990: CESAREAN SECTION WITH BILATERAL TUBAL LIGATION 04/19/2015: COLONOSCOPY 05/18/2018: COLONOSCOPY WITH PROPOFOL; N/A     Comment:  Procedure: COLONOSCOPY WITH PROPOFOL;  Surgeon: Lin Landsman, MD;  Location: [facility]  ENDOSCOPY;  Service:               Gastroenterology;  Laterality: N/A; 05/18/2018: ESOPHAGOGASTRODUODENOSCOPY (EGD) WITH PROPOFOL; N/A     Comment:  Procedure: ESOPHAGOGASTRODUODENOSCOPY (EGD) WITH               PROPOFOL;  Surgeon: Lin Landsman, MD;  Location:               [facility] ENDOSCOPY;  Service: Gastroenterology;  Laterality:               N/A; 06/16/2015: HEMORRHOID SURGERY; N/A     Comment:  Procedure: HEMORRHOIDECTOMY;  Surgeon: Leonie Green, MD;  Location: [facility] ORS;  Service: General;                Laterality: N/A; 12/24/2013: HYSTEROSCOPY W/D&C  BMI    Body Mass Index:  26.13 kg/m      Reproductive/Obstetrics negative OB ROS                             Anesthesia Physical  Anesthesia Plan  ASA: III  Anesthesia Plan: MAC   Post-op Pain Management:    Induction: Intravenous  PONV Risk Score and Plan: 2 and Ondansetron, Propofol infusion, TIVA and Midazolam  Airway Management Planned: Natural Airway and Nasal Cannula  Additional Equipment: None  Intra-op Plan:   Post-operative Plan:   Informed Consent: I have reviewed the patients  History and Physical, chart, labs and discussed the procedure including the risks, benefits and alternatives for the proposed anesthesia with the patient or authorized representative who has indicated his/her understanding and acceptance.     Dental Advisory Given  Plan Discussed with: Anesthesiologist, CRNA and Surgeon  Anesthesia Plan Comments: (Discussed risks of anesthesia with patient, including possibility of difficulty with spontaneous ventilation under anesthesia necessitating airway intervention, PONV, and rare risks such as cardiac or respiratory or neurological events. Patient understands.)        Anesthesia Quick Evaluation

## 2020-03-14 NOTE — Brief Op Note (Signed)
03/14/2020  8:11 AM  PATIENT:  Karen Edwards  54 y.o. female  PRE-OPERATIVE DIAGNOSIS:  BACK PAIN from implantable device (Battery pocket)  POST-OPERATIVE DIAGNOSIS:  BACK PAIN (same)  PROCEDURE:  Procedure(s): Scar revision;  LUMBAR SPINAL CORD SIMULATOR REVISION (N/A); re-implantation under deeper layer.  SURGEON:  Surgeon(s) and Role:    * Milinda Pointer, MD - Primary  PHYSICIAN ASSISTANT:   ASSISTANTS: none   ANESTHESIA:   IV sedation  EBL:  2 mL   BLOOD ADMINISTERED:none  DRAINS: none   LOCAL MEDICATIONS USED:  Sensorcaine & Lidocaine  SPECIMEN:  No Specimen  DISPOSITION OF SPECIMEN:  N/A  COUNTS:  YES  TOURNIQUET:  * No tourniquets in log *  DICTATION: .Dragon Dictation  PLAN OF CARE: Discharge to home after PACU  PATIENT DISPOSITION:  Will follow up at office in 7-10 days to remove staples.   Delay start of Pharmacological VTE agent (>24hrs) due to surgical blood loss or risk of bleeding: NOT APPLICABLE

## 2020-03-14 NOTE — Op Note (Signed)
PROVIDER NOTE: Information contained herein reflects review and annotations entered in association with encounter. Interpretation of such information and data should be left to medically-trained personnel. Information provided to patient can be located elsewhere in the medical record under "Patient Instructions". Document created using STT-dictation technology, any transcriptional errors that may result from process are unintentional.    Patient: Karen Edwards  Service Category: Surgery  Provider: Gaspar Cola, MD  DOB: [birth year]  DOS: 02/07/2020  Location: [facility]  MRN: [id number]  Setting: Ambulatory - outpatient  Referring Provider: No ref. 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  Area Prepped: Entire Posterior Lumbosacral Areas Prepping solution: ChloraPrep (2% chlorhexidine gluconate and 70% isopropyl alcohol) Region: Lumbosacral   Surgical Indication(s):   Indications: BACK  PAIN  Pain Score: Pre-operative: 6 /10 Post-operative: 0-No pain/10  Pre-op Assessment:  Karen Edwards is a 54 y.o. (year old), female patient, seen today for interventional treatment. She  has a past surgical history that includes Cesarean section; Ankle surgery (Right, 03/02/2014); Hemorrhoid surgery (N/A, 06/16/2015); Cervical spine surgery (02/21/2016); Cesarean section with bilateral tubal ligation (1990); Colonoscopy (04/19/2015); Hysteroscopy with D & C (12/24/2013); Colonoscopy with propofol (N/A, 05/18/2018); Esophagogastroduodenoscopy (egd) with propofol (N/A, 05/18/2018); Spinal cord stimulator insertion (N/A, 06/18/2018); and Tubal ligation.  Initial Vital Signs:  Pulse/EKG Rate: (!) 58ECG Heart Rate: (!) 54 Temp: 97.8 F (36.6 C) Resp: 15 BP: 123/70 SpO2: 100 %  BMI: Estimated body mass index is 27.34 kg/m as calculated from the following:   Height as of this encounter: 5' (1.524 m).   Weight as of this encounter: 63.5 kg.  Risk Assessment: Allergies: Reviewed. She has No Known Allergies.  Allergy Precautions: None required Coagulopathies: Reviewed. None identified.  Blood-thinner therapy: None at this time Active Infection(s): Reviewed. None identified. [name] is afebrile  Site Confirmation: [name] was asked to confirm the procedure and laterality before marking the site, which she did. Procedure checklist: Completed Consent: Before the procedure and under the influence of no sedative(s), amnesic(s), or anxiolytics, the patient was informed of the treatment options, risks and possible complications. To fulfill our ethical and legal obligations, as recommended by the American Medical Association's Code of Ethics, I have informed the patient of my clinical impression; the nature and purpose of the treatment or procedure; the risks, benefits, and possible complications of the intervention; the alternatives, including doing nothing; the risk(s) and benefit(s) of the  alternative treatment(s) or procedure(s); and the risk(s) and benefit(s) of doing nothing.  Karen Edwards was provided with information about the general risks and possible complications associated with most interventional  procedures. These include, but are not limited to: failure to achieve desired goals, infection, bleeding, organ or nerve damage, allergic reactions, paralysis, and/or death.  In addition, she was informed of those risks and possible complications associated to this particular procedure, which include, but are not limited to: damage to the implant; failure to decrease pain; local, systemic, or serious CNS infections, intraspinal abscess with possible cord compression and paralysis, or life-threatening such as meningitis; intrathecal and/or epidural bleeding with formation of hematoma with possible spinal cord compression and permanent paralysis; organ damage; nerve injury or damage with subsequent sensory, motor, and/or autonomic system dysfunction, resulting in transient or permanent pain, numbness, and/or weakness of one or several areas of the body; allergic reactions, either minor or major life-threatening, such as anaphylactic or anaphylactoid reactions.  Furthermore, [name] was informed of those risks and complications associated with the medications. These include, but are not limited to: allergic reactions (i.e.: anaphylactic or anaphylactoid reactions); arrhythmia;  Hypotension/hypertension; cardiovascular collapse; respiratory depression and/or shortness of breath; swelling or edema; medication-induced neural toxicity; particulate matter embolism and blood vessel occlusion with resultant organ, and/or nervous system infarction and permanent paralysis.  Finally, she was informed that Medicine is not an exact science; therefore, there is also the possibility of unforeseen or unpredictable risks and/or possible complications that may result in a catastrophic outcome. The patient  indicated having understood very clearly. We have given the patient no guarantees and we have made no promises. Enough time was given to the patient to ask questions, all of which were answered to the patient's satisfaction. Karen Edwards has indicated that she wanted to continue with the procedure. Attestation: I, the ordering provider, attest that I have discussed with the patient the benefits, risks, side-effects, alternatives, likelihood of achieving goals, and potential problems during recovery for the procedure that I have provided informed consent. Date  Time: 03/14/2020  5:55 AM   Pre-Procedure Preparation:  Monitoring: As per Anesthesia protocol. Respiration, ETCO2, SpO2, BP, heart rate and rhythm monitor placed and checked for adequate function  Safety Precautions: Patient was assessed for positional comfort and pressure points before starting the procedure.  Time-out: I initiated and conducted the "Time-out" before starting the procedure, as per protocol. The patient was asked to participate by confirming the accuracy of the "Time Out" information. Verification of the correct person, site, and procedure were performed and confirmed by me, the nursing staff, and the patient. "Time-out" conducted as per Joint Commission's Universal Protocol (UP.01.01.01).  Description of Procedure Process:   Times:  Scheduled start: 0715 Procedure:  7:30 AM Surgical: 0 Hr 87 Min 0 Sec  Description of procedure: The procedure site was prepped using a broad-spectrum topical antiseptic. The area was then draped in the usual and standard manner. "Time-out" was performed as per JC Universal Protocol (UP.01.01.01). Neurostimulator battery revision: The location of the previously implanted battery was identified and marked. The previous scar was infiltrated with local anesthetic. An incision was made and the old generator was located and excised. Care was taken not to damage any of the leads.  A pocket was created  about 1 cm deeper than the prior one and the neurostimulator battery was again positioned into this pocket at a deeper level.   After having confirmed proper working status, the generator was placed back into the pocket. Once hemostasis was confirmed, both wounds were closed with Vicryl 2-0 after cleaning them with a solution containing 50:50 hydrogen peroxide and Betadine. Surgical staples were used to close  the skin. The wounds were covered with sterile transparent bio-occlusive dressings, to easily assess any evidence of infection in the future.   The patient tolerated the entire procedure well. A repeat set of vitals were taken after the procedure and the patient was kept under observation until discharge criteria was met. The patient was provided with discharge instructions, including a section on how to identify potential problems. Should any problems arise concerning this procedure, the patient was given instructions to immediately contact us, without hesitation. The neurostimulator representative and I, both provided the patient with our Business cards containing our contact telephone numbers, and instructed the patient to contact either one of [place], at any time, should there be any problems or questions. In any case, we plan to contact the patient by telephone for a follow-up status report regarding this interventional procedure.  Incision: Incision (Closed) 03/14/20 Back-Dressing Type: (telfa, tegaderm) EBL: 2 mL Complications: * No complications entered in OR log *  OR Staff:  Circulator: Dwana Curd, RN Scrub Person: Betsey Amen Vendor Representative : Fidela Salisbury  Safety Precautions: Informed consent obtained. Patient allergies reviewed. "Time-out" was performed as per JC Universal Protocol (UP.01.01.01). Strict sterile technique kept at all times. Fluoroscopy guidance used for placement accuracy and confirmation. Continuous Anesthesia monitoring maintained throughout the entire case.  Pressure points and patient comfort assessed during initial patient positioning. Meaningful verbal contact maintained at all times during the critical portions of the procedure. Aspiration looking for blood return was conducted prior to all injections. At no point did we inject any substances, as a needle was being advanced. No attempts were made at seeking any paresthesias. Safe injection practices and needle disposal techniques used. Medications properly checked for expiration dates. SDV (single dose vial) medications used. Adequate hemostasis attained before wound closure.  Vitals:   03/14/20 0848 03/14/20 0903 03/14/20 0915 03/14/20 0949  BP: 105/60  132/72 118/87  Pulse: (!) 55  66 60  Resp: _0 Temp:  (!) 97.5 F (36.4 C) (!) 97 F (36.1 C)   TempSrc:   Temporal   SpO2: 92%  99% 99%  Weight:      Height:       Medication(s): Medication Name Total Dose  bupivacaine (MARCAINE) 0.5 % (with pres) injection 13 mL  lidocaine (XYLOCAINE) 1 % (with pres) injection 13 mL  bacitracin ointment 1 application  lactated ringers infusion 70.82 mL  ceFAZolin (ANCEF) 1-4 GM/50ML-% IVPB Cannot be calculated  ceFAZolin (ANCEF) IVPB 1 g/50 mL premix 1 g  famotidine (PEPCID) tablet 20 mg 20 mg   Imaging Guidance (Spinal):          None used.  Antibiotic Prophylaxis:   Antibiotics Given (last 72 hours)    Date/Time Action Medication Dose   03/14/20 0731 Given   ceFAZolin (ANCEF) IVPB 1 g/50 mL premix 1 g     Indication(s): Surgical Prophylaxis.  Post-operative Assessment:  Post-procedure Vital Signs:  Pulse/HCG Rate: 60(!) 55 Temp: (!) 97 F (36.1 C) Resp: 16 BP: 118/87 SpO2: 99 %  Complications: No immediate post-treatment complications observed by team, or reported by patient.  Note: The patient tolerated the entire procedure well. A repeat set of vitals were taken after the procedure and the patient was kept under observation following institutional policy, for this type of  procedure. Post-procedural neurological assessment was performed, showing return to baseline, prior to discharge. The patient was provided with post-procedure discharge instructions, including a section on how to identify potential  problems. Should any problems arise concerning this procedure, the patient was given instructions to immediately contact us, at any time, without hesitation. In any case, we plan to contact the patient by telephone for a follow-up status report regarding this interventional procedure.  Comments:  No additional relevant information.  Post-op Plan of Care  Disposition: Discharge home under the care of a responsible, capable, adult driver. Return to clinics in 6-10 days for post-procedure evaluation and removal of staples.  Discharge Date & Time: 03/14/2020  9:51 AM  Follow-up:  Recent Visits No visits were found meeting these conditions.  Showing recent visits within past 90 days and meeting all other requirements   Future Appointments No visits were found meeting these conditions.  Showing future appointments within next 90 days and meeting all other requirements   Primary Care Physician: Pleas Koch, NP Location: [facility]  Note by: Gaspar Cola, MD Date: 02/07/2020; Time: 10:32 AM

## 2020-03-14 NOTE — Discharge Instructions (Signed)
AMBULATORY SURGERY  DISCHARGE INSTRUCTIONS   1) The drugs that you were given will stay in your system until tomorrow so for the next 24 hours you should not:  A) Drive an automobile B) Make any legal decisions C) Drink any alcoholic beverage   2) You may resume regular meals tomorrow.  Today it is better to start with liquids and gradually work up to solid foods.  You may eat anything you prefer, but it is better to start with liquids, then soup and crackers, and gradually work up to solid foods.   3) Please notify your doctor immediately if you have any unusual bleeding, trouble breathing, redness and pain at the surgery site, drainage, fever, or pain not relieved by medication.    4) Additional Instructions:        Please contact your physician with any problems or Same Day Surgery at [phone], Monday through Friday 6 am to 4 pm, or [place] [facility] number at [phone].   Per Dr. Adalberto Cole office, you may continue to take oxcodone 5mg  immediate release for chronic pain and take norco as needed for surgical pain. May shower in 48 hours but cover bandage to keep dry.

## 2020-03-14 NOTE — Transfer of Care (Signed)
Immediate Anesthesia Transfer of Care Note  Patient: Karen Edwards  Procedure(s) Performed: LUMBAR SPINAL CORD SIMULATOR REVISION (N/A )  Patient Location: PACU  Anesthesia Type:General  Level of Consciousness: drowsy  Airway & Oxygen Therapy: Patient Spontanous Breathing and Patient connected to nasal cannula oxygen  Post-op Assessment: Report given to RN  Post vital signs: stable  Last Vitals:  Vitals Value Taken Time  BP    Temp    Pulse 55 03/14/20 0819  Resp 17 03/14/20 0819  SpO2 100 % 03/14/20 0819  Vitals shown include unvalidated device data.  Last Pain:  Vitals:   03/14/20 0624  TempSrc: Tympanic  PainSc: 6       Patients Stated Pain Goal: 1 ([id number])  Complications: No apparent anesthesia complications

## 2020-03-14 NOTE — Anesthesia Postprocedure Evaluation (Signed)
Anesthesia Post Note  Patient: Karen Edwards  Procedure(s) Performed: LUMBAR SPINAL CORD SIMULATOR REVISION (N/A )  Patient location during evaluation: Phase II Anesthesia Type: MAC Level of consciousness: awake and alert Pain management: pain level controlled Vital Signs Assessment: post-procedure vital signs reviewed and stable Respiratory status: spontaneous breathing, nonlabored ventilation, respiratory function stable and patient connected to nasal cannula oxygen Cardiovascular status: blood pressure returned to baseline and stable Postop Assessment: no apparent nausea or vomiting Anesthetic complications: no     Last Vitals:  Vitals:   03/14/20 0903 03/14/20 0915  BP:  132/72  Pulse:  66  Resp:  16  Temp: (!) 36.4 C (!) 36.1 C  SpO2:  99%    Last Pain:  Vitals:   03/14/20 0915  TempSrc: Temporal  PainSc: 0-No pain                 Arita Miss

## 2020-03-15 ENCOUNTER — Telehealth: Payer: PPO | Admitting: Pain Medicine

## 2020-03-23 ENCOUNTER — Ambulatory Visit: Payer: Medicare PPO | Attending: Pain Medicine | Admitting: Pain Medicine

## 2020-03-23 ENCOUNTER — Encounter: Payer: Self-pay | Admitting: Pain Medicine

## 2020-03-23 ENCOUNTER — Other Ambulatory Visit: Payer: Self-pay

## 2020-03-23 VITALS — BP 121/71 | HR 73 | Temp 97.8°F | Resp 18

## 2020-03-23 DIAGNOSIS — M5442 Lumbago with sciatica, left side: Secondary | ICD-10-CM | POA: Insufficient documentation

## 2020-03-23 DIAGNOSIS — T85848S Pain due to other internal prosthetic devices, implants and grafts, sequela: Secondary | ICD-10-CM | POA: Diagnosis present

## 2020-03-23 DIAGNOSIS — M5441 Lumbago with sciatica, right side: Secondary | ICD-10-CM | POA: Insufficient documentation

## 2020-03-23 DIAGNOSIS — Z9689 Presence of other specified functional implants: Secondary | ICD-10-CM | POA: Diagnosis present

## 2020-03-23 DIAGNOSIS — M5137 Other intervertebral disc degeneration, lumbosacral region: Secondary | ICD-10-CM | POA: Insufficient documentation

## 2020-03-23 DIAGNOSIS — G894 Chronic pain syndrome: Secondary | ICD-10-CM | POA: Diagnosis present

## 2020-03-23 DIAGNOSIS — G8929 Other chronic pain: Secondary | ICD-10-CM | POA: Insufficient documentation

## 2020-03-23 NOTE — Progress Notes (Signed)
PROVIDER NOTE: Information contained herein reflects review and annotations entered in association with encounter. Interpretation of such information and data should be left to medically-trained personnel. Information provided to patient can be located elsewhere in the medical record under "Patient Instructions". Document created using STT-dictation technology, any transcriptional errors that may result from process are unintentional.    Patient: Karen Edwards  Service Category: E/M  Provider: Gaspar Cola, MD  DOB: Aug 15, 1966  DOS: 03/23/2020  Referring Provider: Pleas Koch, NP  MRN: [id number]  Setting: Ambulatory outpatient  PCP: Pleas Koch, NP  Type: Established Patient  Specialty: Interventional Pain Management    Location: Office  Delivery: Face-to-face     Primary Reason(s) for Visit: Encounter for post-procedure evaluation of chronic illness with mild to moderate exacerbation CC: Back Pain (low)  HPI  Ms. Allington is a 54 y.o. year old, female patient, who comes today for a post-procedure evaluation. She has Chronic low back pain (Primary Area of Pain) (Bilateral) (R>L); Menorrhagia; Fatigue; DDD (degenerative disc disease), lumbar; DDD (degenerative disc disease), cervical; Chronic occipital neuralgia (Fifth Area of Pain) (Bilateral) (R>L); Lumbar facet syndrome (Bilateral) (L>R); DDD (degenerative disc disease), lumbosacral; Lumbar radicular pain (Right) (L4); Sacroiliac joint dysfunction (Bilateral); Depression; Hemorrhoids; Insomnia; Chronic constipation; Pharmacologic therapy; Hyperlipidemia; Chronic neck pain (Fourth Area of Pain) (Bilateral) (R>L); GERD (gastroesophageal reflux disease); Numbness and tingling of upper extremity (Right); Chronic upper extremity pain (Bilateral) (R>L); Chronic sacroiliac joint pain (Bilateral) (L>R); Lumbar facet hypertrophy (Bilateral); L1-2 disc extrusion (Right); Cervical foraminal stenosis (C5-6) (Bilateral); Long term prescription  opiate use; Opiate use; Disorder of skeletal system; Problems influencing health status; Chronic pain syndrome; Chronic cervical radicular pain (Right: C6/C7) (Left: C5/T1); Chronic lumbar radicular pain; Chronic hip pain (Tertiary Area of Pain) (Bilateral) (R>L); Chronic lower extremity pain (Secondary Area of Pain) (Bilateral) (R>L); Chronic shoulder pain (Bilateral) (L>R); Chronic musculoskeletal pain; Lumbar spondylosis; Stress incontinence of urine; Cervical facet syndrome (Bilateral) (R>L); Myofascial pain; Spondylosis without myelopathy or radiculopathy, cervical region; Cervicalgia; Nausea and vomiting; History of colonic polyps; S/P insertion of Epidural Neurostimulator (SCS) (Bilateral); MVP (mitral valve prolapse); Multiple nevi; Chronic neck pain with history of cervical spinal surgery; 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[facility]); Preop testing; Trigger point with back pain (Left); Cervicogenic headache (Bilateral); Occipital headache (Bilateral); Chronic tension-type headache, intractable; Cervico-occipital neuralgia (Bilateral); Pain due to any device, implant or graft (SCS battery) (Left PSIS area); Spondylosis without myelopathy or radiculopathy, lumbosacral region; History of cervical spinal surgery (C5-7 ACDF); Acute postoperative pain; and Rectal bleeding on their problem list. Her primarily concern today is the Back Pain (low)  Pain Assessment: Location: Lower Back Onset: More than a month ago Duration: Chronic pain Severity: 4 /10 (subjective, self-reported pain score)  Note: Reported level is compatible with observation.  Timing: Constant BP: 121/71  HR: 73  Ms. Sanmiguel comes in today for post-op evaluation after Lumbar Spinal Cord Simulator Revision done on 03/14/2020.  The patient comes into the clinic today for removal of staples.  The wound looks good with no evidence of redness, swelling, or tenderness.  Today we have removed her staples and replaced them with  Steri-Strips.  The patient was instructed to continue taking her antibiotics for another 3 days and then discontinue them.  She was also instructed to continue cleaning the area with soap and water twice a day  Further details on both, my assessment(s), as well as the proposed treatment plan, please see below.  Post-operative Assessment  Intra-procedural problems/complications: None observed.  Reported side-effects: None.        Post-surgical adverse reactions or complications: None reported         Laboratory Chemistry Profile   Renal Lab Results  Component Value Date   BUN 7 09/08/2017   CREATININE 0.84 09/08/2017   BCR 8 (L) 09/08/2017   GFR 76.93 05/20/2017   GFRAA 93 09/08/2017   GFRNONAA 81 09/08/2017   PROTEINUR NEGATIVE 01/30/2017     Electrolytes Lab Results  Component Value Date   NA 139 09/08/2017   K 4.4 09/08/2017   CL 99 09/08/2017   CALCIUM 9.8 09/08/2017   MG 2.1 09/08/2017     Hepatic Lab Results  Component Value Date   AST 18 09/08/2017   ALT 12 (L) 01/30/2017   ALBUMIN 4.7 09/08/2017   ALKPHOS 73 09/08/2017     ID Lab Results  Component Value Date   SARSCOV2NAA NEGATIVE 03/10/2020   STAPHAUREUS NEGATIVE 03/14/2020   MRSAPCR NEGATIVE 03/14/2020   PREGTESTUR NEGATIVE 06/16/2015     Bone Lab Results  Component Value Date   VD25OH 43.74 01/10/2016   25OHVITD1 51 09/08/2017   25OHVITD2 <1.0 09/08/2017   25OHVITD3 51 09/08/2017     Endocrine Lab Results  Component Value Date   GLUCOSE 90 09/08/2017   GLUCOSEU NEGATIVE 01/30/2017   TSH 1.18 01/10/2016     Neuropathy Lab Results  Component Value Date   VITAMINB12 >2000 (H) 09/08/2017     CNS No results found for: COLORCSF, APPEARCSF, RBCCOUNTCSF, WBCCSF, POLYSCSF, LYMPHSCSF, EOSCSF, PROTEINCSF, GLUCCSF, JCVIRUS, CSFOLI, IGGCSF, LABACHR, ACETBL, LABACHR, ACETBL   Inflammation (CRP: Acute  ESR: Chronic) Lab Results  Component Value Date   CRP 1.4 09/08/2017   ESRSEDRATE 11  09/08/2017     Rheumatology No results found for: RF, ANA, LABURIC, URICUR, LYMEIGGIGMAB, LYMEABIGMQN, HLAB27   Coagulation Lab Results  Component Value Date   PLT 202.0 05/20/2017     Cardiovascular Lab Results  Component Value Date   TROPONINI <0.03 01/30/2017   HGB 13.5 05/20/2017   HCT 40.5 05/20/2017     Screening Lab Results  Component Value Date   SARSCOV2NAA NEGATIVE 03/10/2020   COVIDSOURCE NASOPHARYNGEAL 03/26/2019   STAPHAUREUS NEGATIVE 03/14/2020   MRSAPCR NEGATIVE 03/14/2020   PREGTESTUR NEGATIVE 06/16/2015     Cancer No results found for: CEA, CA125, LABCA2   Allergens No results found for: ALMOND, APPLE, ASPARAGUS, AVOCADO, BANANA, BARLEY, BASIL, BAYLEAF, GREENBEAN, LIMABEAN, WHITEBEAN, BEEFIGE, REDBEET, BLUEBERRY, BROCCOLI, CABBAGE, MELON, CARROT, CASEIN, CASHEWNUT, CAULIFLOWER, CELERY     Note: Lab results reviewed.   Recent Diagnostic Imaging Results  DG PAIN CLINIC C-ARM 1-60 MIN NO REPORT Fluoro was used, but no Radiologist interpretation will be provided.  Please refer to "NOTES" tab for provider progress note.  Complexity Note: Imaging results reviewed. Results shared with Ms. Tye Savoy, using [employer].                              Meds   Current Outpatient Medications:  .  acetaminophen (TYLENOL) 500 MG tablet, Take 1,000 mg by mouth every 6 (six) hours as needed (for pain.)., Disp: , Rfl:  .  Biotin 5000 MCG TABS, Take 5,000 mcg by mouth daily., Disp: , Rfl:  .  FLUoxetine (PROZAC) 20 MG capsule, Take 3 capsules (60 mg total) by mouth daily., Disp: 270 capsule, Rfl: 3 .  lidocaine (XYLOCAINE) 5 % ointment, Apply 1 application topically daily as needed for moderate pain.  Wash hands thoroughly after applying, Disp: 150 g, Rfl: PRN .  methocarbamol (ROBAXIN) 500 MG tablet, Take 2 tablets (1,000 mg total) by mouth at bedtime., Disp: 180 tablet, Rfl: 1 .  Omega-3 Fatty Acids (FISH OIL) 1200 MG CAPS, Take 1,200 mg by mouth daily., Disp: , Rfl:   .  ondansetron (ZOFRAN-ODT) 4 MG disintegrating tablet, Take 4 mg by mouth every 4 (four) hours as needed (vertigo/dizziness). , Disp: , Rfl:  .  oxyCODONE (OXY IR/ROXICODONE) 5 MG immediate release tablet, Take 1 tablet (5 mg total) by mouth 2 (two) times daily as needed for severe pain. Must last 30 days., Disp: 60 tablet, Rfl: 0 .  [START ON 04/17/2020] oxyCODONE (OXY IR/ROXICODONE) 5 MG immediate release tablet, Take 1 tablet (5 mg total) by mouth 2 (two) times daily as needed for severe pain. Must last 30 days., Disp: 60 tablet, Rfl: 0 .  scopolamine (TRANSDERM-SCOP) 1 MG/3DAYS, Place 1 patch onto the skin daily as needed (vertigo/dizziness). , Disp: , Rfl:  .  traZODone (DESYREL) 100 MG tablet, Take 3 tablets (300 mg total) by mouth at bedtime. For sleep., Disp: 270 tablet, Rfl: 3 .  TURMERIC PO, Take 1 capsule by mouth daily., Disp: , Rfl:   ROS  Constitutional: Denies any fever or chills Gastrointestinal: No reported hemesis, hematochezia, vomiting, or acute GI distress Musculoskeletal: Denies any acute onset joint swelling, redness, loss of ROM, or weakness Neurological: No reported episodes of acute onset apraxia, aphasia, dysarthria, agnosia, amnesia, paralysis, loss of coordination, or loss of consciousness  Allergies  Ms. Rhymes has No Known Allergies.  [place]  Drug: Ms. Dickard  reports no history of drug use. Alcohol:  reports current alcohol use of about 1.0 standard drinks of alcohol per week. Tobacco:  reports that she has been smoking cigarettes. She has a 18.50 pack-year smoking history. She has never used smokeless tobacco. Medical:  has a past medical history of Anxiety, Chronic back pain, Degenerative joint disease, Depression, Headache, Hypotension, IBS (irritable bowel syndrome) (05/2015), and MVP (mitral valve prolapse). Surgical: Ms. Lattner  has a past surgical history that includes Cesarean section; Ankle surgery (Right, 03/02/2014); Hemorrhoid surgery (N/A,  06/16/2015); Cervical spine surgery (02/21/2016); Cesarean section with bilateral tubal ligation (1990); Colonoscopy (04/19/2015); Hysteroscopy with D & C (12/24/2013); Colonoscopy with propofol (N/A, 05/18/2018); Esophagogastroduodenoscopy (egd) with propofol (N/A, 05/18/2018); Spinal cord stimulator insertion (N/A, 06/18/2018); Tubal ligation; and Lumbar spinal cord simulator revision (N/A, 03/14/2020). Family: family history includes Arthritis in her mother; Congestive Heart Failure in her maternal grandfather; Depression in her mother; Diabetes in her maternal aunt; Diabetes Mellitus I in her maternal grandmother; Gout in her maternal aunt; Hearing loss in her father; Heart attack in her maternal grandfather; Hyperlipidemia in her father and mother; Hypertension in her father and mother; Hypothyroidism in her mother; Kidney disease in her mother; Prostate cancer in her paternal grandfather; Stroke in her maternal grandmother; Thyroid disease in her maternal aunt; Vision loss in her mother.  Postop Exam  General appearance: Afebrile. Well nourished, well developed, and well hydrated. In no apparent acute distress. Vitals:   03/23/20 1406  BP: 121/71  Pulse: 73  Resp: 18  Temp: 97.8 F (36.6 C)  TempSrc: Temporal  SpO2: 99%   BMI Assessment: Estimated body mass index is 27.34 kg/m as calculated from the following:   Height as of 03/14/20: 5' (1.524 m).   Weight as of 03/14/20: 139 lb 15.9 oz (63.5 kg). Surgical site: Wound is healing well. No redness, tenderness,  discharge, abnormal odors, or any other evidence of infection or complications.  Assessment  Primary Diagnosis & Pertinent Problem List: The primary encounter diagnosis was Chronic pain syndrome. Diagnoses of Chronic low back pain (Primary Area of Pain) (Bilateral) (R>L), DDD (degenerative disc disease), lumbosacral, Pain due to any device, implant or graft (SCS battery) (Left PSIS area), and S/P insertion of Epidural Neurostimulator (SCS)  (Bilateral) were also pertinent to this visit.  Plan of Care  Problem-specific:  No problem-specific Assessment & Plan notes found for this encounter.  Ms. AMEILA WELDON has a current medication list which includes the following long-term medication(s): fluoxetine, lidocaine, methocarbamol, oxycodone, [START ON 04/17/2020] oxycodone, and trazodone.  Pharmacotherapy (Medications Ordered): No orders of the defined types were placed in this encounter.  Orders:  Orders Placed This Encounter  Procedures  . Suture removal kit    Please have suture removal kit available in exam room.    Standing Status:   Standing    Number of Occurrences:   1  . Nursing Communication    Scheduling Instructions:     Please analyze the patient's implant and provide me with a printout of its current settings and calculated battery life.  . Provide equipment / supplies at bedside    Equipment required: Single use, disposable, "Suture Removal Kit"    Standing Status:   Standing    Number of Occurrences:   1    Order Specific Question:   Specify    Answer:   Suture Removal Kit   Follow-up plan:   No follow-ups on file.      Considering:   Possible revision of spinal cord stimulator battery implant to reposition the battery deeper. Possible right lumbar facet RFA #1 Possible bilateral SI joint RFA #1 Diagnostic bilateral IA hip joint injection Diagnostic bilateral femoral + obturator NB Possible bilateral femoral + obturator nerve RFA Diagnostic right L1-2LESI Diagnostic [id number] Diagnostic right CESI Possible bilateral occipital nerve RFA #1 Diagnostic bilateral IA shoulder joint injection Diagnostic bilateral suprascapular NB Possible bilateral suprascapular nerve RFA   Palliative PRN treatment(s):   Diagnostic bilateral GONB #2 Palliative bilateral cervical facet blocks #5 Palliative bilateral cervical facet RFA #2  Palliative right L2-3 interlaminar LESI #3   Palliative right L1 TFESI #2 Bilateral lumbar spinal cord stimulator trial (done)  Diagnostic bilateral lumbar facet block #4  Diagnostic bilateral SI joint block #3  Palliative left lumbar facet RFA #2 (last done 12/28/2019)     Recent Visits Date Type Provider Dept  03/08/20 Office Visit Milinda Pointer, [facility]  01/26/20 Telemedicine Milinda Pointer, MD [facility]  01/11/20 Procedure visit Milinda Pointer, MD [facility]  12/28/19 Procedure visit Milinda Pointer, MD [facility]  Showing recent visits within past 90 days and meeting all other requirements   Today's Visits Date Type Provider Dept  03/23/20 Office Visit Milinda Pointer, MD [facility]  Showing today's visits and meeting all other requirements   Future Appointments Date Type Provider Dept  05/03/20 Appointment Milinda Pointer, MD [facility]  Showing future appointments within next 90 days and meeting all other requirements   I discussed the assessment and treatment plan with the patient. The patient was provided an opportunity to ask questions and all were answered. The patient agreed with the plan and demonstrated an understanding of the instructions.  Patient advised to call back or seek an in-person evaluation if the symptoms or condition worsens.  Duration of encounter: 30 minutes.  Note by: Gaspar Cola, MD Date: 03/23/2020; Time: 4:15 PM

## 2020-03-23 NOTE — Progress Notes (Signed)
Safety precautions to be maintained throughout the outpatient stay will include: orient to surroundings, keep bed in low position, maintain call bell within reach at all times, provide assistance with transfer out of bed and ambulation.   [place] Dr. Dossie Arbour in to see. SCS implant site clear and with out any signs of infection. Staples removed after area cleaned with chloro prep. Steri strips applied and wound care instructions given with patient understanding.

## 2020-04-15 ENCOUNTER — Emergency Department: Payer: Medicare PPO

## 2020-04-15 ENCOUNTER — Other Ambulatory Visit: Payer: Self-pay

## 2020-04-15 ENCOUNTER — Emergency Department
Admission: EM | Admit: 2020-04-15 | Discharge: 2020-04-15 | Disposition: A | Discharge status: Home / Self Care | Payer: Medicare PPO | Attending: Emergency Medicine | Admitting: Emergency Medicine

## 2020-04-15 ENCOUNTER — Encounter: Payer: Self-pay | Admitting: Intensive Care

## 2020-04-15 DIAGNOSIS — Y9301 Activity, walking, marching and hiking: Secondary | ICD-10-CM | POA: Diagnosis not present

## 2020-04-15 DIAGNOSIS — X509XXA Other and unspecified overexertion or strenuous movements or postures, initial encounter: Secondary | ICD-10-CM | POA: Diagnosis not present

## 2020-04-15 DIAGNOSIS — S92354A Nondisplaced fracture of fifth metatarsal bone, right foot, initial encounter for closed fracture: Secondary | ICD-10-CM | POA: Insufficient documentation

## 2020-04-15 DIAGNOSIS — Y999 Unspecified external cause status: Secondary | ICD-10-CM | POA: Insufficient documentation

## 2020-04-15 DIAGNOSIS — S99921A Unspecified injury of right foot, initial encounter: Secondary | ICD-10-CM | POA: Diagnosis present

## 2020-04-15 DIAGNOSIS — Y929 Unspecified place or not applicable: Secondary | ICD-10-CM | POA: Diagnosis not present

## 2020-04-15 DIAGNOSIS — F17210 Nicotine dependence, cigarettes, uncomplicated: Secondary | ICD-10-CM | POA: Insufficient documentation

## 2020-04-15 NOTE — ED Provider Notes (Signed)
[facility] Emergency Department Provider Note  ____________________________________________  Time seen: Approximately 6:26 PM  I have reviewed the triage vital signs and the nursing notes.   HISTORY  Chief Complaint Foot Pain (right)    HPI Karen Edwards is a 54 y.o. female who presents emergency department for evaluation of foot/ankle injury and pain.  Patient states that she is fractured her ankle several times in the past requiring surgery.  She missed a step earlier today and is having pain along the right ankle the lateral aspect as well as the lateral aspect of the foot.  Patient denies any other injuries or complaints.  No medication for this complaint prior to arrival.  Patient with medical history as described below with no complaints of chronic medical problems.         Past Medical History:  Diagnosis Date  . Anxiety   . Chronic back pain   . Degenerative joint disease   . Depression   . Headache    neck pain  . Hypotension   . IBS (irritable bowel syndrome) 05/2015  . MVP (mitral valve prolapse)    patient denies    Patient Active Problem List   Diagnosis Date Noted  . Acute postoperative pain 12/28/2019  . History of cervical spinal surgery (C5-7 ACDF) 12/14/2019  . Spondylosis without myelopathy or radiculopathy, lumbosacral region 11/30/2019  . Pain due to any device, implant or graft (SCS battery) (Left PSIS area) 11/24/2019  . Cervicogenic headache (Bilateral) 08/19/2019  . Occipital headache (Bilateral) 08/19/2019  . Chronic tension-type headache, intractable 08/19/2019  . Cervico-occipital neuralgia (Bilateral) 08/19/2019  . Trigger point with back pain (Left) 05/04/2019  . Preop testing 03/24/2019  . 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 Chronic neck pain with history of cervical spinal surgery 09/17/2018  . Multiple nevi 08/10/2018  . MVP (mitral valve prolapse) 06/18/2018  . S/P insertion  of Epidural Neurostimulator (SCS) (Bilateral) 05/20/2018  . Nausea and vomiting   . History of colonic polyps   . Cervicalgia 03/10/2018  . Spondylosis without myelopathy or radiculopathy, cervical region 03/09/2018  . Myofascial pain 01/29/2018  . Cervical facet syndrome (Bilateral) (R>L) 01/19/2018  . Stress incontinence of urine 01/13/2018  . Lumbar spondylosis 12/08/2017  . Chronic musculoskeletal pain 10/13/2017  . Chronic hip pain [facility] Area of Pain) (Bilateral) (R>L) 09/08/2017  . Chronic lower extremity pain (Secondary Area of Pain) (Bilateral) (R>L) 09/08/2017  . Chronic shoulder pain (Bilateral) (L>R) 09/08/2017  . GERD (gastroesophageal reflux disease) 09/04/2017  . Chronic upper extremity pain (Bilateral) (R>L) 09/04/2017  . Chronic sacroiliac joint pain (Bilateral) (L>R) 09/04/2017  . Lumbar facet hypertrophy (Bilateral) 09/04/2017  . L1-2 disc extrusion (Right) 09/04/2017  . Cervical foraminal stenosis (C5-6) (Bilateral) 09/04/2017  . Long term prescription opiate use 09/04/2017  . Opiate use 09/04/2017  . Disorder of skeletal system 09/04/2017  . Problems influencing health status 09/04/2017  . Chronic pain syndrome 09/04/2017  . Chronic cervical radicular pain (Right: C6/C7) (Left: C5/T1) 09/04/2017  . Chronic lumbar radicular pain 09/04/2017  . Pharmacologic therapy 07/11/2016  . Hyperlipidemia 07/11/2016  . Chronic neck pain (Fourth Area of Pain) (Bilateral) (R>L) 01/26/2016  . Numbness and tingling of upper extremity (Right) 01/26/2016  . Insomnia 01/09/2016  . Chronic constipation 01/09/2016  . Depression 03/21/2015  . Hemorrhoids 03/21/2015  . Rectal bleeding 03/21/2015  . DDD (degenerative disc disease), lumbosacral 03/19/2015  . Lumbar radicular pain (Right) (L4) 03/19/2015  . Sacroiliac joint dysfunction (Bilateral)  03/19/2015  . DDD (degenerative disc disease), lumbar 03/07/2015  . DDD (degenerative disc disease), cervical 03/07/2015  . Chronic  occipital neuralgia (Fifth Area of Pain) (Bilateral) (R>L) 03/07/2015  . Lumbar facet syndrome (Bilateral) (L>R) 03/07/2015  . Chronic low back pain (Primary Area of Pain) (Bilateral) (R>L) 02/01/2013  . Menorrhagia 02/01/2013  . Fatigue 02/01/2013    Past Surgical History:  Procedure Laterality Date  . ANKLE SURGERY Right 03/02/2014   repair of tendon and sural nerve right ankle  . CERVICAL SPINE SURGERY  02/21/2016   arthrodesis of anterior cervical spine (fusion of C5-6,C6-7 and insertion of bone allograft  . CESAREAN SECTION    . CESAREAN SECTION WITH BILATERAL TUBAL LIGATION  1990  . COLONOSCOPY  04/19/2015  . COLONOSCOPY WITH PROPOFOL N/A 05/18/2018   Procedure: COLONOSCOPY WITH PROPOFOL;  Surgeon: Lin Landsman, MD;  Location: [facility] ENDOSCOPY;  Service: Gastroenterology;  Laterality: N/A;  . ESOPHAGOGASTRODUODENOSCOPY (EGD) WITH PROPOFOL N/A 05/18/2018   Procedure: ESOPHAGOGASTRODUODENOSCOPY (EGD) WITH PROPOFOL;  Surgeon: Lin Landsman, MD;  Location: [facility]COPY;  Service: Gastroenterology;  Laterality: N/A;  . HEMORRHOID SURGERY N/A 06/16/2015   Procedure: HEMORRHOIDECTOMY;  Surgeon: Leonie Green, MD;  Location: [facility] ORS;  Service: General;  Laterality: N/A;  . HYSTEROSCOPY WITH D & C  12/24/2013  . LUMBAR SPINAL CORD SIMULATOR REVISION N/A 03/14/2020   Procedure: LUMBAR SPINAL CORD SIMULATOR REVISION;  Surgeon: Milinda Pointer, MD;  Location: [facility] ORS;  Service: Neurosurgery;  Laterality: N/A;  . SPINAL CORD STIMULATOR INSERTION N/A 06/18/2018   Procedure: LUMBAR SPINAL CORD STIMULATOR INSERTION;  Surgeon: Milinda Pointer, MD;  Location: [facility] ORS;  Service: Neurosurgery;  Laterality: N/A;  . TUBAL LIGATION      Prior to Admission medications   Medication Sig Start Date End Date Taking? Authorizing Provider  acetaminophen (TYLENOL) 500 MG tablet Take 1,000 mg by mouth every 6 (six) hours as needed (for pain.).    [provider]  Biotin 5000  MCG TABS Take 5,000 mcg by mouth daily.    [provider]  FLUoxetine (PROZAC) 20 MG capsule Take 3 capsules (60 mg total) by mouth daily. 10/15/19   Pleas Koch, NP  lidocaine (XYLOCAINE) 5 % ointment Apply 1 application topically daily as needed for moderate pain. Wash hands thoroughly after applying 02/22/20 08/20/20  Milinda Pointer, MD  methocarbamol (ROBAXIN) 500 MG tablet Take 2 tablets (1,000 mg total) by mouth at bedtime. 03/18/20 09/14/20  Milinda Pointer, MD  Omega-3 Fatty Acids (FISH OIL) 1200 MG CAPS Take 1,200 mg by mouth daily.    [provider]  ondansetron (ZOFRAN-ODT) 4 MG disintegrating tablet Take 4 mg by mouth every 4 (four) hours as needed (vertigo/dizziness).  02/03/20   [provider]  oxyCODONE (OXY IR/ROXICODONE) 5 MG immediate release tablet Take 1 tablet (5 mg total) by mouth 2 (two) times daily as needed for severe pain. Must last 30 days. 03/18/20 04/17/20  Milinda Pointer, MD  oxyCODONE (OXY IR/ROXICODONE) 5 MG immediate release tablet Take 1 tablet (5 mg total) by mouth 2 (two) times daily as needed for severe pain. Must last 30 days. 04/17/20 05/17/20  Milinda Pointer, MD  scopolamine (TRANSDERM-SCOP) 1 MG/3DAYS Place 1 patch onto the skin daily as needed (vertigo/dizziness).  02/03/20   [provider]  traZODone (DESYREL) 100 MG tablet Take 3 tablets (300 mg total) by mouth at bedtime. For sleep. 10/15/19   Pleas Koch, NP  TURMERIC PO Take 1 capsule by mouth daily.  [provider]    Allergies Patient has no known allergies.  Family History  Problem Relation Age of Onset  . Arthritis Mother   . Depression Mother   . Hyperlipidemia Mother   . Hypertension Mother   . Kidney disease Mother   . Vision loss Mother   . Hypothyroidism Mother   . Hearing loss Father   . Hyperlipidemia Father   . Hypertension Father   . Diabetes Mellitus I Maternal Grandmother   . Stroke Maternal Grandmother    . Congestive Heart Failure Maternal Grandfather   . Heart attack Maternal Grandfather   . Prostate cancer Paternal Grandfather   . Thyroid disease Maternal Aunt   . Diabetes Maternal Aunt   . Gout Maternal Aunt     Social History Social History   Tobacco Use  . Smoking status: Current Every Day Smoker    Packs/day: 0.50    Years: 37.00    Pack years: 18.50    Types: Cigarettes  . Smokeless tobacco: Never Used  Vaping Use  . Vaping Use: Never used  Substance Use Topics  . Alcohol use: Yes    Alcohol/week: 1.0 standard drink    Types: 1 Cans of beer per week    Comment: drinks an occasional beer  . Drug use: No     Review of Systems  Constitutional: No fever/chills Eyes: No visual changes. No discharge ENT: No upper respiratory complaints. Cardiovascular: no chest pain. Respiratory: no cough. No SOB. Gastrointestinal: No abdominal pain.  No nausea, no vomiting.  No diarrhea.  No constipation. Musculoskeletal: Positive for right ankle/foot injury and pain Skin: Negative for rash, abrasions, lacerations, ecchymosis. Neurological: Negative for headaches, focal weakness or numbness. 10-point ROS otherwise negative.  ____________________________________________   PHYSICAL EXAM:  VITAL SIGNS: ED Triage Vitals  Enc Vitals Group     BP 04/15/20 1700 113/76     Pulse Rate 04/15/20 1700 79     Resp 04/15/20 1700 16     Temp 04/15/20 1700 99.4 F (37.4 C)     Temp Source 04/15/20 1700 Oral     SpO2 04/15/20 1700 95 %     Weight 04/15/20 1715 140 lb (63.5 kg)     Height 04/15/20 1715 5\' 1"  (1.549 m)     Head Circumference --      Peak Flow --      Pain Score 04/15/20 1715 4     Pain Loc --      Pain Edu? --      Excl. in [place]? --      Constitutional: Alert and oriented. Well appearing and in no acute distress. Eyes: Conjunctivae are normal. PERRL. EOMI. Head: Atraumatic. ENT:      Ears:       Nose: No congestion/rhinnorhea.      Mouth/Throat: Mucous  membranes are moist.  Neck: No stridor.    Cardiovascular: Normal rate, regular rhythm. Normal S1 and S2.  Good peripheral circulation. Respiratory: Normal respiratory effort without tachypnea or retractions. Lungs CTAB. Good air entry to the bases with no decreased or absent breath sounds. Musculoskeletal: Full range of motion to all extremities. No gross deformities appreciated.  Visualization of the right ankle and foot reveals no gross deformity.  No abrasions, lacerations, ecchymosis.  Patient is tender to palpation along the fifth metatarsal with no palpable abnormalities.  No other significant areas of tenderness.  Dorsalis pedis pulse intact.  Sensation intact all digits. Neurologic:  Normal speech and language. No gross  focal neurologic deficits are appreciated.  Skin:  Skin is warm, dry and intact. No rash noted. Psychiatric: Mood and affect are normal. Speech and behavior are normal. Patient exhibits appropriate insight and judgement.   ____________________________________________   LABS (all labs ordered are listed, but only abnormal results are displayed)  Labs Reviewed - No data to display ____________________________________________  EKG   ____________________________________________  RADIOLOGY I personally viewed and evaluated these images as part of my medical decision making, as well as reviewing the written report by the radiologist.  DG Foot Complete Right  Result Date: 04/15/2020 CLINICAL DATA:  54 year old female with trauma to the right foot. EXAM: RIGHT FOOT COMPLETE - 3+ VIEW COMPARISON:  None. FINDINGS: Minimally displaced oblique fracture of the distal aspect of the fifth metatarsal. No other acute fracture identified. No arthritic changes. The soft tissues are unremarkable. IMPRESSION: Minimally displaced oblique fracture of the distal fifth metatarsal. Electronically Signed   By: Anner Crete M.D.   On: 04/15/2020 17:48     ____________________________________________    PROCEDURES  Procedure(s) performed:    Procedures    Medications - No data to display   ____________________________________________   INITIAL IMPRESSION / ASSESSMENT AND PLAN / ED COURSE  Pertinent labs & imaging results that were available during my care of the patient were reviewed by me and considered in my medical decision making (see chart for details).  Review of the [place] CSRS was performed in accordance of the [place] prior to dispensing any controlled drugs.           Patient's diagnosis is consistent with fifth metatarsal fracture.  Patient presented to the emergency department with pain to the foot and ankle after miss stepping earlier today.  X-ray reveals nondisplaced fifth metatarsal fracture.  Patient is given postop shoe and crutches.  Follow-up with orthopedics.  Patient is on chronic pain medications and as such no narcotics will be prescribed for pain relief.. Patient is given ED precautions to return to the ED for any worsening or new symptoms.     ____________________________________________  FINAL CLINICAL IMPRESSION(S) / ED DIAGNOSES  Final diagnoses:  Closed nondisplaced fracture of fifth metatarsal bone of right foot, initial encounter      NEW MEDICATIONS STARTED DURING THIS VISIT:  ED Discharge Orders    None          This chart was dictated using voice recognition software/Dragon. Despite best efforts to proofread, errors can occur which can change the meaning. Any change was purely unintentional.    Darletta Moll, PA-C 04/15/20 1831    Duffy Bruce, MD 04/17/20 0221

## 2020-04-15 NOTE — ED Notes (Signed)
Patient has used crutches before and felt comfortable with the pair she was given.

## 2020-04-15 NOTE — ED Notes (Signed)
Discharge vital signs not obtained due to patient being in a hurry to leave due to family obligations.

## 2020-04-15 NOTE — ED Triage Notes (Signed)
Pt c/o right foot pain after mechanical fall today. Reports painful to put pressure on right foot

## 2020-05-03 ENCOUNTER — Telehealth: Payer: Medicare PPO | Admitting: Pain Medicine

## 2020-05-04 ENCOUNTER — Encounter: Payer: Self-pay | Admitting: Pain Medicine

## 2020-05-07 NOTE — Progress Notes (Signed)
Unsuccessful attempt to contact patient for Virtual Visit (Pain Management Telehealth)   Patient provided contact information:  [phone] (home); [phone] (mobile); (Preferred) [phone] [email or web address]   Pre-screening:  Our staff was successful in contacting Karen Edwards using the above provided information.   I unsuccessfully attempted to make contact with Karen Edwards twice on 05/08/2020 via telephone. I was unable to complete the virtual encounter due to call going directly to voicemail. I was able to leave a message.  Pharmacotherapy Assessment  Analgesic: Oxycodone IR 5 mg, 1 tab PO BID (10 mg/day of oxycodone) MME/day:15mg /day.   Follow-up plan:   Reschedule Visit.     Considering:   Possible revision of spinal cord stimulator battery implant to reposition the battery deeper. Possible right lumbar facet RFA #1 Possible bilateral SI joint RFA #1 Diagnostic bilateral IA hip joint injection Diagnostic bilateral femoral + obturator NB Possible bilateral femoral + obturator nerve RFA Diagnostic right L1-2LESI Diagnostic [id number] Diagnostic right CESI Possible bilateral occipital nerve RFA #1 Diagnostic bilateral IA shoulder joint injection Diagnostic bilateral suprascapular NB Possible bilateral suprascapular nerve RFA   Palliative PRN treatment(s):   Diagnostic bilateral GONB #2 Palliative bilateral cervical facet blocks #5 Palliative bilateral cervical facet RFA #2  Palliative right L2-3 interlaminar LESI #3  Palliative right L1 TFESI #2 Bilateral lumbar spinal cord stimulator trial (done)  Diagnostic bilateral lumbar facet block #4  Diagnostic bilateral SI joint block #3  Palliative left lumbar facet RFA #2 (last done 12/28/2019)      Recent Visits Date Type Provider Dept  03/23/20 Office Visit Milinda Pointer, [facility]  03/08/20 Office Visit Milinda Pointer, MD [facility]  Showing  recent visits within past 90 days and meeting all other requirements Future Appointments No visits were found meeting these conditions. Showing future appointments within next 90 days and meeting all other requirements   Note by: Gaspar Cola, MD Date: 05/08/2020; Time: 3:24 PM

## 2020-05-08 ENCOUNTER — Other Ambulatory Visit: Payer: Self-pay

## 2020-05-08 ENCOUNTER — Ambulatory Visit: Payer: Medicare PPO | Attending: Pain Medicine | Admitting: Pain Medicine

## 2020-05-08 DIAGNOSIS — G894 Chronic pain syndrome: Secondary | ICD-10-CM

## 2020-05-17 ENCOUNTER — Telehealth: Payer: Medicare PPO | Admitting: Pain Medicine

## 2020-06-13 ENCOUNTER — Ambulatory Visit
Payer: Medicare PPO | Attending: Pain Medicine | Admitting: Student in an Organized Health Care Education/Training Program

## 2020-06-13 ENCOUNTER — Encounter: Payer: Self-pay | Admitting: Student in an Organized Health Care Education/Training Program

## 2020-06-13 ENCOUNTER — Other Ambulatory Visit: Payer: Self-pay

## 2020-06-13 DIAGNOSIS — M5442 Lumbago with sciatica, left side: Secondary | ICD-10-CM

## 2020-06-13 DIAGNOSIS — M5441 Lumbago with sciatica, right side: Secondary | ICD-10-CM

## 2020-06-13 DIAGNOSIS — M25512 Pain in left shoulder: Secondary | ICD-10-CM

## 2020-06-13 DIAGNOSIS — Z9689 Presence of other specified functional implants: Secondary | ICD-10-CM

## 2020-06-13 DIAGNOSIS — M25511 Pain in right shoulder: Secondary | ICD-10-CM

## 2020-06-13 DIAGNOSIS — M7918 Myalgia, other site: Secondary | ICD-10-CM

## 2020-06-13 DIAGNOSIS — G894 Chronic pain syndrome: Secondary | ICD-10-CM | POA: Diagnosis not present

## 2020-06-13 DIAGNOSIS — M5137 Other intervertebral disc degeneration, lumbosacral region: Secondary | ICD-10-CM

## 2020-06-13 DIAGNOSIS — M25551 Pain in right hip: Secondary | ICD-10-CM

## 2020-06-13 DIAGNOSIS — M5481 Occipital neuralgia: Secondary | ICD-10-CM

## 2020-06-13 DIAGNOSIS — M47816 Spondylosis without myelopathy or radiculopathy, lumbar region: Secondary | ICD-10-CM

## 2020-06-13 DIAGNOSIS — I341 Nonrheumatic mitral (valve) prolapse: Secondary | ICD-10-CM

## 2020-06-13 DIAGNOSIS — G8918 Other acute postprocedural pain: Secondary | ICD-10-CM

## 2020-06-13 DIAGNOSIS — G8929 Other chronic pain: Secondary | ICD-10-CM

## 2020-06-13 DIAGNOSIS — T85848S Pain due to other internal prosthetic devices, implants and grafts, sequela: Secondary | ICD-10-CM | POA: Diagnosis not present

## 2020-06-13 DIAGNOSIS — M25552 Pain in left hip: Secondary | ICD-10-CM

## 2020-06-13 MED ORDER — OXYCODONE HCL 5 MG PO TABS: 5.0000 mg | ORAL_TABLET | Freq: Two times a day (BID) | ORAL | 0 refills | Status: DC | PRN

## 2020-06-13 NOTE — Progress Notes (Signed)
Patient: Karen Edwards  Service Category: E/M  Provider: Gillis Santa, MD  DOB: 01/27/1966  DOS: 06/13/2020  Location: Office  MRN: [id number]  Setting: Ambulatory outpatient  Referring Provider: Pleas Koch, NP  Type: Established Patient  Specialty: Interventional Pain Management  PCP: Pleas Koch, NP  Location: Home  Delivery: TeleHealth     Virtual Encounter - Pain Management PROVIDER NOTE: Information contained herein reflects review and annotations entered in association with encounter. Interpretation of such information and data should be left to medically-trained personnel. Information provided to patient can be located elsewhere in the medical record under "Patient Instructions". Document created using STT-dictation technology, any transcriptional errors that may result from process are unintentional.    Contact & Pharmacy Preferred: [phone] Home: [phone] (home) Mobile: [phone] (mobile) E-mail: [email or web address] .[address] [place] [address], [place] [address] Phone: [phone] Fax: [phone]   Pre-screening  Karen Edwards offered "in-person" vs "virtual" encounter. She indicated preferring virtual for this encounter.   Reason COVID-19*  Social distancing based on CDC and AMA recommendations.   I contacted Karen Edwards on 06/13/2020 via video conference.      I clearly identified myself as Gillis Santa, MD. I verified that I was speaking with the correct person using two identifiers (Name: Karen Edwards, and date of birth: [birth year]).  Consent I sought verbal advanced consent from Karen Edwards for virtual visit interactions. I informed Karen Edwards of possible security and privacy concerns, risks, and limitations associated with providing "not-in-person" medical evaluation and management services. I also informed Karen Edwards of the availability of "in-person" appointments. Finally,  I informed her that there would be a charge for the virtual visit and that she could be  personally, fully or partially, financially responsible for it. Karen Edwards expressed understanding and agreed to proceed.   Historic Elements   Karen Edwards is a 54 y.o. year old, female patient evaluated today after her last contact with our practice on 03/23/2020. Karen Edwards  has a past medical history of Anxiety, Chronic back pain, Degenerative joint disease, Depression, Headache, Hypotension, IBS (irritable bowel syndrome) (05/2015), and MVP (mitral valve prolapse). She also  has a past surgical history that includes Cesarean section; Ankle surgery (Right, 03/02/2014); Hemorrhoid surgery (N/A, 06/16/2015); Cervical spine surgery (02/21/2016); Cesarean section with bilateral tubal ligation (1990); Colonoscopy (04/19/2015); Hysteroscopy with D & C (12/24/2013); Colonoscopy with propofol (N/A, 05/18/2018); Esophagogastroduodenoscopy (egd) with propofol (N/A, 05/18/2018); Spinal cord stimulator insertion (N/A, 06/18/2018); Tubal ligation; and Lumbar spinal cord simulator revision (N/A, 03/14/2020). Karen Edwards has a current medication list which includes the following prescription(s): biotin, fluoxetine, lidocaine, methocarbamol, fish oil, ondansetron, scopolamine, trazodone, turmeric, acetaminophen, [START ON 06/30/2020] oxycodone, and [START ON 07/30/2020] oxycodone. She  reports that she has been smoking cigarettes. She has a 18.50 pack-year smoking history. She has never used smokeless tobacco. She reports current alcohol use of about 1.0 standard drink of alcohol per week. She reports that she does not use drugs. Karen Edwards has No Known Allergies.   HPI  Today, she is being contacted for medication management.   No change in medical history since last visit.  Patient's pain is at baseline.  Patient continues multimodal pain regimen as prescribed.  States that it provides pain relief and improvement in  functional status. Doing well after her spinal cord stimulator revision performed in May Refill her oxycodone 5 mg twice daily as needed as  below Informed patient that she will need to come in for a urine toxicology screen prior to her next fill which will be on 06/30/2020.  Patient endorsed understanding.  She states that she will do that.  Pharmacotherapy Assessment  Analgesic: 05/31/2020  1   01/26/2020  Oxycodone Hcl 5 MG Tablet  60.00  30 Fr Nav   [id number]   [facility] (4540)   0/0  15.00 MME  [place]ic[place]   [place]     Monitoring:  PMP: PDMP reviewed during this encounter.       Pharmacotherapy: No side-effects or adverse reactions reported. Compliance: No problems identified. Effectiveness: Clinically acceptable. Plan: Refer to "POC".  UDS:  Summary  Date Value Ref Range Status  04/06/2018 FINAL  Final    Comment:    ==================================================================== TOXASSURE SELECT 13 (MW) ==================================================================== Test                             Result       Flag       Units Drug Present and Declared for Prescription Verification   Norhydrocodone                 228          EXPECTED   ng/mg creat    Norhydrocodone is an expected metabolite of hydrocodone. Drug Absent but Declared for Prescription Verification   Hydrocodone                    Not Detected UNEXPECTED ng/mg creat    Hydrocodone is almost always present in patients taking this drug    consistently. Absence of hydrocodone could be due to lapse of    time since the last dose or unusual pharmacokinetics (rapid    metabolism). ==================================================================== Test                      Result    Flag   Units      Ref Range   Creatinine              60               mg/dL      >=20 ==================================================================== Declared Medications:  The flagging and interpretation on this report are based  on the  following declared medications.  Unexpected results may arise from  inaccuracies in the declared medications.  **Note: The testing scope of this panel includes these medications:  Hydrocodone (Norco)  **Note: The testing scope of this panel does not include following  reported medications:  Acetaminophen (Norco)  Atorvastatin (Lipitor)  Cyanocobalamin  Fluoxetine (Prozac)  Methocarbamol (Robaxin)  Potassium  Supplement  Trazodone (Desyrel) ==================================================================== For clinical consultation, please call [phone]. ====================================================================     Laboratory Chemistry Profile   Renal Lab Results  Component Value Date   BUN 7 09/08/2017   CREATININE 0.84 09/08/2017   BCR 8 (L) 09/08/2017   GFR 76.93 05/20/2017   GFRAA 93 09/08/2017   GFRNONAA 81 09/08/2017     Hepatic Lab Results  Component Value Date   AST 18 09/08/2017   ALT 12 (L) 01/30/2017   ALBUMIN 4.7 09/08/2017   ALKPHOS 73 09/08/2017     Electrolytes Lab Results  Component Value Date   NA 139 09/08/2017   K 4.4 09/08/2017   CL 99 09/08/2017   CALCIUM 9.8 09/08/2017   MG 2.1 09/08/2017     Bone  Lab Results  Component Value Date   VD25OH 43.74 01/10/2016   25OHVITD1 51 09/08/2017   25OHVITD2 <1.0 09/08/2017   25OHVITD3 51 09/08/2017     Inflammation (CRP: Acute Phase) (ESR: Chronic Phase) Lab Results  Component Value Date   CRP 1.4 09/08/2017   ESRSEDRATE 11 09/08/2017       Note: Above Lab results reviewed.   Imaging  DG Foot Complete Right CLINICAL DATA:  54 year old female with trauma to the right foot.  EXAM: RIGHT FOOT COMPLETE - 3+ VIEW  COMPARISON:  None.  FINDINGS: Minimally displaced oblique fracture of the distal aspect of the fifth metatarsal. No other acute fracture identified. No arthritic changes. The soft tissues are unremarkable.  IMPRESSION: Minimally displaced  oblique fracture of the distal fifth metatarsal.  Electronically Signed   By: Anner Crete M.D.   On: 04/15/2020 17:48  Assessment  The primary encounter diagnosis was Chronic pain syndrome. 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  Plan of Care  Ms. ATIANNA HAIDAR has a current medication list which includes the following long-term medication(s): fluoxetine, lidocaine, methocarbamol, trazodone, [START ON 06/30/2020] oxycodone, and [START ON 07/30/2020] oxycodone.  Pharmacotherapy (Medications Ordered): Meds ordered this encounter  Medications  . oxyCODONE (OXY IR/ROXICODONE) 5 MG immediate release tablet    Sig: Take 1 tablet (5 mg total) by mouth 2 (two) times daily as needed for severe pain. Must last 30 days.    Dispense:  60 tablet    Refill:  0    Chronic Pain: STOP Act (Not applicable) Fill 1 day early if closed on refill date.  Marland Kitchen oxyCODONE (OXY IR/ROXICODONE) 5 MG immediate release tablet    Sig: Take 1 tablet (5 mg total) by mouth 2 (two) times daily as needed for severe pain. Must last 30 days.    Dispense:  60 tablet    Refill:  0    Chronic Pain: STOP Act (Not applicable) Fill 1 day early if closed on refill date. D   Orders:  Orders Placed This Encounter  Procedures  . ToxASSURE Select 13 (MW), Urine    Volume: 30 ml(s). Minimum 3 ml of urine is needed. Document temperature of fresh sample. Indications: Long term (current) use of opiate analgesic [phone])    Order  Specific Question:   Release to patient    Answer:   Immediate   Follow-up plan:   Return in about 2 months (around 08/26/2020) for Medication Management, in person.   Recent Visits Date Type Provider Dept  03/23/20 Office Visit Milinda Pointer, MD [facility]  Showing recent visits within past 90 days and meeting all other requirements Today's Visits Date Type Provider Dept  06/13/20 Telemedicine Gillis Santa, MD [facility]  Showing today's visits and meeting all other requirements Future Appointments No visits were found meeting these conditions. Showing future appointments within next 90 days and meeting all other requirements  I discussed the assessment and treatment plan with the patient. The patient was provided an opportunity to ask questions and all were answered. The patient agreed with the plan and  demonstrated an understanding of the instructions.  Patient advised to call back or seek an in-person evaluation if the symptoms or condition worsens.  Duration of encounter:30 minutes.  Note by: Gillis Santa, MD Date: 06/13/2020; Time: 3:38 PM

## 2020-06-14 ENCOUNTER — Encounter: Payer: Medicare PPO | Admitting: Pain Medicine

## 2020-06-14 ENCOUNTER — Telehealth: Payer: Medicare PPO | Admitting: Student in an Organized Health Care Education/Training Program

## 2020-06-14 ENCOUNTER — Telehealth: Payer: Self-pay | Admitting: Student in an Organized Health Care Education/Training Program

## 2020-06-14 NOTE — Telephone Encounter (Signed)
[facility] lvmail stating they need verification as to why patient is starting Oxycodone 5mg . They are only authorized to give 7 days. Please call pharmacy

## 2020-08-01 ENCOUNTER — Other Ambulatory Visit: Payer: Self-pay

## 2020-08-01 DIAGNOSIS — F32A Depression, unspecified: Secondary | ICD-10-CM

## 2020-08-01 MED ORDER — FLUOXETINE HCL 20 MG PO CAPS: 60.0000 mg | ORAL_CAPSULE | Freq: Every day | ORAL | 0 refills | Status: DC

## 2020-08-21 NOTE — Progress Notes (Signed)
PROVIDER NOTE: Information contained herein reflects review and annotations entered in association with encounter. Interpretation of such information and data should be left to medically-trained personnel. Information provided to patient can be located elsewhere in the medical record under "Patient Instructions". Document created using STT-dictation technology, any transcriptional errors that may result from process are unintentional.    Patient: Karen Edwards  Service Category: E/M  Provider: Gaspar Cola, MD  DOB: 07-21-1966  DOS: 08/23/2020  Specialty: Interventional Pain Management  MRN: [id number]  Setting: Ambulatory outpatient  PCP: Pleas Koch, NP  Type: Established Patient    Referring Provider: Pleas Koch, NP  Location: Office  Delivery: Face-to-face     HPI  Ms. JENNETTA FLOOD, a 54 y.o. year old female, is here today because of her Chronic pain syndrome [G89.4]. Ms. Penniman primary complain today is Neck Pain (back) Last encounter: My last encounter with her was on 03/23/2020. Pertinent problems: Ms. Poche has Chronic low back pain (Primary Area of Pain) (Bilateral) (R>L); DDD (degenerative disc disease), lumbar; DDD (degenerative disc disease), cervical; Chronic occipital neuralgia (Fifth Area of Pain) (Bilateral) (R>L); Lumbar facet syndrome (Bilateral) (L>R); DDD (degenerative disc disease), lumbosacral; Lumbar radicular pain (Right) (L4); Sacroiliac joint dysfunction (Bilateral); Chronic neck pain (Fourth Area of Pain) (Bilateral) (R>L); Numbness and tingling of upper extremity (Right); Chronic upper extremity pain (Bilateral) (R>L); Chronic sacroiliac joint pain (Bilateral) (L>R); Lumbar facet hypertrophy (Bilateral); L1-2 disc extrusion (Right); Cervical foraminal stenosis (C5-6) (Bilateral); Chronic pain syndrome; Chronic cervical radicular pain (Right: C6/C7) (Left: C5/T1); Chronic lumbar radicular pain; Chronic hip pain (Tertiary Area of Pain) (Bilateral)  (R>L); Chronic lower extremity pain (Secondary Area of Pain) (Bilateral) (R>L); Chronic shoulder pain (Bilateral) (L>R); Chronic musculoskeletal pain; Lumbar spondylosis; Cervical facet syndrome (Bilateral) (R>L); Myofascial pain; Spondylosis without myelopathy or radiculopathy, cervical region; Cervicalgia; S/P insertion of Epidural Neurostimulator (SCS) (Bilateral); Chronic neck pain with history of cervical spinal surgery; 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 Pain Assessment: Severity of Chronic pain is reported as a 6 /10. Location: Neck (pt stated that her back is a 3 today) Lower/Pain radiaties down shoulder to arm and fingers, the thumb and index finger becomes numb. Onset: More than a month ago. Quality: Numbness, Burning, Aching, Throbbing. Timing: Constant. Modifying factor(s): laying down and meds. Vitals:  height is 5' 1" (1.549 m) and weight is 150 lb (68 kg). Her temperature is 97.4 F (36.3 C) (abnormal). Her blood pressure is 115/70 and her pulse is 72. Her oxygen saturation is 98%.   Reason for encounter: medication management.  The patient indicates doing well with the current medication regimen. No adverse reactions or side effects reported to the medications.  The patient indicates doing well on her current regimen except for having some opioid-induced constipation.  Today I will be sending some medicine to her pharmacy to help her with this.  In addition, she is beginning to experience more pain in the cervical region and upper extremities  with numbness in her thumb and index finger, bilaterally, in the distribution of C6.  She does have a known history of a C5-6 foraminal stenosis, bilaterally.  RTCB: 11/27/2020 Nonopioids transfer 08/23/2020: Lidocaine 5%  ointment and Robaxin  Pharmacotherapy Assessment   Analgesic: Oxycodone IR 5 mg, 1 tab PO BID (10 mg/day of oxycodone) MME/day:43m/day.   Monitoring: [place] PMP: PDMP reviewed during this encounter.       Pharmacotherapy: No side-effects or adverse reactions reported. Compliance: No problems identified. Effectiveness: Clinically acceptable.  BChauncey Fischer RN  08/23/2020  1:55 PM  Sign when Signing Visit Nursing Pain Medication Assessment:  Safety precautions to be maintained throughout the outpatient stay will include: orient to surroundings, keep bed in low position, maintain call bell within reach at all times, provide assistance with transfer out of bed and ambulation.  Medication Inspection Compliance: Pill count conducted under aseptic conditions, in front of the patient. Neither the pills nor the bottle was removed from the patient's sight at any time. Once count was completed pills were immediately returned to the patient in their original bottle.  Medication: Oxycodone IR Pill/Patch Count: 12 of 60 pills remain Pill/Patch Appearance: Markings consistent with prescribed medication Bottle Appearance: Standard pharmacy container. Clearly labeled. Filled Date: 8 / 4 / 21 Last Medication intake:  last dose a week agoSafety precautions to be maintained throughout the outpatient stay will include: orient to surroundings, keep bed in low position, maintain call bell within reach at all times, provide assistance with transfer out of bed and ambulation.     UDS:  Summary  Date Value Ref Range Status  04/06/2018 FINAL  Final    Comment:    ==================================================================== TOXASSURE SELECT 13  (MW) ==================================================================== Test                             Result       Flag       Units Drug Present and Declared for Prescription Verification   Norhydrocodone                 228          EXPECTED   ng/mg creat    Norhydrocodone is an expected metabolite of hydrocodone. Drug Absent but Declared for Prescription Verification   Hydrocodone                    Not Detected UNEXPECTED ng/mg creat    Hydrocodone is almost always present in patients taking this drug    consistently. Absence of hydrocodone could be due to lapse of    time since the last dose or unusual pharmacokinetics (rapid    metabolism). ==================================================================== Test                      Result    Flag   Units      Ref Range   Creatinine              60               mg/dL      >=20 ==================================================================== Declared Medications:  The flagging and interpretation on this report are based on the  following declared medications.  Unexpected results may arise from  inaccuracies in the declared medications.  **Note: The testing scope of this panel includes these medications:  Hydrocodone (Norco)  **Note: The testing scope of this panel does not include following  reported medications:  Acetaminophen (Norco)  Atorvastatin (Lipitor)  Cyanocobalamin  Fluoxetine (Prozac)  Methocarbamol (Robaxin)  Potassium  Supplement  Trazodone (Desyrel) ==================================================================== For clinical consultation, please call ([phone] ====================================================================  ROS  Constitutional: Denies any fever or chills Gastrointestinal: No reported hemesis, hematochezia, vomiting, or acute GI distress Musculoskeletal: Denies any acute onset joint swelling, redness, loss of ROM, or weakness Neurological: No reported episodes  of acute onset apraxia, aphasia, dysarthria, agnosia, amnesia, paralysis, loss of coordination, or loss of consciousness  Medication Review  Biotin, FLUoxetine, Fish Oil, Turmeric, acetaminophen, lidocaine, methocarbamol, naloxegol oxalate, ondansetron, oxyCODONE, scopolamine, and traZODone  History Review  Allergy: Ms. Kellison has No Known Allergies. Drug: Ms. Sheperd  reports no history of drug use. Alcohol:  reports current alcohol use of about 1.0 standard drink of alcohol per week. Tobacco:  reports that she has been smoking cigarettes. She has a 18.50 pack-year smoking history. She has never used smokeless tobacco. Social: Ms. Akter  reports that she has been smoking cigarettes. She has a 18.50 pack-year smoking history. She has never used smokeless tobacco. She reports current alcohol use of about 1.0 standard drink of alcohol per week. She reports that she does not use drugs. Medical:  has a past medical history of Anxiety, Chronic back pain, Degenerative joint disease, Depression, Headache, Hypotension, IBS (irritable bowel syndrome) (05/2015), and MVP (mitral valve prolapse). Surgical: Ms. Woodring  has a past surgical history that includes Cesarean section; Ankle surgery (Right, 03/02/2014); Hemorrhoid surgery (N/A, 06/16/2015); Cervical spine surgery (02/21/2016); Cesarean section with bilateral tubal ligation (1990); Colonoscopy (04/19/2015); Hysteroscopy with D & C (12/24/2013); Colonoscopy with propofol (N/A, 05/18/2018); Esophagogastroduodenoscopy (egd) with propofol (N/A, 05/18/2018); Spinal cord stimulator insertion (N/A, 06/18/2018); Tubal ligation; and Lumbar spinal cord simulator revision (N/A, 03/14/2020). Family: family history includes Arthritis in her mother; Congestive Heart Failure in her maternal grandfather; Depression in her mother; Diabetes in her maternal aunt; Diabetes Mellitus I in her maternal grandmother; Gout in her maternal aunt; Hearing loss in her father; Heart  attack in her maternal grandfather; Hyperlipidemia in her father and mother; Hypertension in her father and mother; Hypothyroidism in her mother; Kidney disease in her mother; Prostate cancer in her paternal grandfather; Stroke in her maternal grandmother; Thyroid disease in her maternal aunt; Vision loss in her mother.  Laboratory Chemistry Profile   Renal Lab Results  Component Value Date   BUN 7 09/08/2017   CREATININE 0.84 09/08/2017   BCR 8 (L) 09/08/2017   GFR 76.93 05/20/2017   GFRAA 93 09/08/2017   GFRNONAA 81 09/08/2017     Hepatic Lab Results  Component Value Date   AST 18 09/08/2017   ALT 12 (L) 01/30/2017   ALBUMIN 4.7 09/08/2017   ALKPHOS 73 09/08/2017     Electrolytes Lab Results  Component Value Date   NA 139 09/08/2017   K 4.4 09/08/2017   CL 99 09/08/2017   CALCIUM 9.8 09/08/2017   MG 2.1 09/08/2017     Bone Lab Results  Component Value Date   VD25OH 43.74 01/10/2016   25OHVITD1 51 09/08/2017   25OHVITD2 <1.0 09/08/2017   25OHVITD3 51 09/08/2017     Inflammation (CRP: Acute Phase) (ESR: Chronic Phase) Lab Results  Component Value Date   CRP 1.4 09/08/2017   ESRSEDRATE 11 09/08/2017       Note: Above Lab results reviewed.  Recent Imaging Review  DG Foot Complete Right CLINICAL DATA:  54 year old female with trauma to the right foot.  EXAM: RIGHT FOOT COMPLETE - 3+ VIEW  COMPARISON:  None.  FINDINGS: Minimally displaced oblique fracture of the distal aspect of the fifth metatarsal. No other acute fracture identified. No arthritic changes. The soft tissues are  unremarkable.  IMPRESSION: Minimally displaced oblique fracture of the distal fifth metatarsal.  Electronically Signed   By: Anner Crete M.D.   On: 04/15/2020 17:48 Note: Reviewed        Physical Exam  General appearance: Well nourished, well developed, and well hydrated. In no apparent acute distress Mental status: Alert, oriented x 3 (person, place, & time)        Respiratory: No evidence of acute respiratory distress Eyes: PERLA Vitals: BP 115/70   Pulse 72   Temp (!) 97.4 F (36.3 C)   Ht 5' 1" (1.549 m)   Wt 150 lb (68 kg)   LMP 05/05/2017 (Exact Date)   SpO2 98%   BMI 28.34 kg/m  BMI: Estimated body mass index is 28.34 kg/m as calculated from the following:   Height as of this encounter: 5' 1" (1.549 m).   Weight as of this encounter: 150 lb (68 kg). Ideal: Ideal body weight: 47.8 kg (105 lb 6.1 oz) Adjusted ideal body weight: 55.9 kg (123 lb 3.7 oz)  Assessment   Status Diagnosis  Controlled Controlled Controlled 1. Chronic pain syndrome   2. Chronic low back pain (Primary Area of Pain) (Bilateral) (R>L)   3. Chronic hip pain (Tertiary Area of Pain) (Bilateral) (R>L)   4. Chronic shoulder pain (Bilateral) (L>R)   5. Pharmacologic therapy   6. Chronic musculoskeletal pain   7. Pain due to any device, implant or graft (SCS battery) (Left PSIS area)   8. DDD (degenerative disc disease), lumbosacral   9. S/P insertion of Epidural Neurostimulator (SCS) (Bilateral)   10. Bilateral occipital neuralgia   11. Cervical foraminal stenosis (C5-6) (Bilateral)   12. Cervicalgia   13. Chronic cervical radicular pain (Right: C6/C7) (Left: C5/T1)   14. DDD (degenerative disc disease), cervical   15. Therapeutic opioid-induced constipation (OIC)      Updated Problems: Problem  Therapeutic Opioid-Induced Constipation (Oic)    Plan of Care  Problem-specific:  No problem-specific Assessment & Plan notes found for this encounter.  Ms. LAKECHIA NAY has a current medication list which includes the following long-term medication(s): fluoxetine, trazodone, lidocaine, methocarbamol, naloxegol oxalate, [START ON 08/29/2020] oxycodone, [START ON 09/28/2020] oxycodone, and [START ON 10/28/2020] oxycodone.  Pharmacotherapy (Medications Ordered): Meds ordered this encounter  Medications  . methocarbamol (ROBAXIN) 500 MG tablet    Sig: Take 2  tablets (1,000 mg total) by mouth at bedtime.    Dispense:  60 tablet    Refill:  2    Fill one day early if pharmacy is closed on scheduled refill date. May substitute for generic, or similar, if available. Void any older refills or prescriptions of this medication.  . lidocaine (XYLOCAINE) 5 % ointment    Sig: Apply 1 application topically daily as needed for moderate pain. Wash hands thoroughly after applying    Dispense:  150 g    Refill:  PRN    Fill one day early if pharmacy is closed on scheduled refill date. May substitute for generic, or similar, if available. Void any older refills or prescriptions of this medication.  Marland Kitchen oxyCODONE (OXY IR/ROXICODONE) 5 MG immediate release tablet    Sig: Take 1 tablet (5 mg total) by mouth 2 (two) times daily as needed for severe pain. Must last 30 days.    Dispense:  60 tablet    Refill:  0    Chronic Pain: STOP Act (Not applicable) Fill 1 day early if closed on refill date. Avoid benzodiazepines within 8  hours of opioids  . oxyCODONE (OXY IR/ROXICODONE) 5 MG immediate release tablet    Sig: Take 1 tablet (5 mg total) by mouth 2 (two) times daily as needed for severe pain. Must last 30 days.    Dispense:  60 tablet    Refill:  0    Chronic Pain: STOP Act (Not applicable) Fill 1 day early if closed on refill date. Avoid benzodiazepines within 8 hours of opioids  . oxyCODONE (OXY IR/ROXICODONE) 5 MG immediate release tablet    Sig: Take 1 tablet (5 mg total) by mouth 2 (two) times daily as needed for severe pain. Must last 30 days.    Dispense:  60 tablet    Refill:  0    Chronic Pain: STOP Act (Not applicable) Fill 1 day early if closed on refill date. Avoid benzodiazepines within 8 hours of opioids  . naloxegol oxalate (MOVANTIK) 12.5 MG TABS tablet    Sig: Take 1 tablet (12.5 mg total) by mouth daily. Do not break tablet. Take on empty stomach 1 hour before or 2 hours after a meal.    Dispense:  30 tablet    Refill:  5    Fill one day early if  pharmacy is closed on scheduled refill date. May substitute for generic if available.   Orders:  Orders Placed This Encounter  Procedures  . Cervical Epidural Injection    Level(s): C7-T1 Laterality: Right-sided Purpose: Diagnostic/Therapeutic Indication(s): Radiculitis and cervicalgia associater with cervical degenerative disc disease.    Standing Status:   Future    Standing Expiration Date:   09/23/2020    Scheduling Instructions:     Procedure: Cervical Epidural Steroid Injection/Block     Sedation: With Sedation.     Timeframe: As soon as schedule allows    Order Specific Question:   Where will this procedure be performed?    Answer:   [facility]    Comments:   by Dr. Dossie Arbour   Follow-up plan:   Return for Procedure (w/ sedation): (R) CESI.      Considering:   Possible revision of spinal cord stimulator battery implant to reposition the battery deeper. Possible right lumbar facet RFA #1 Possible bilateral SI joint RFA #1 Diagnostic bilateral IA hip joint injection Diagnostic bilateral femoral + obturator NB Possible bilateral femoral + obturator nerve RFA Diagnostic right L1-2LESI Diagnostic [id number] Diagnostic right CESI Possible bilateral occipital nerve RFA #1 Diagnostic bilateral IA shoulder joint injection Diagnostic bilateral suprascapular NB Possible bilateral suprascapular nerve RFA   Palliative PRN treatment(s):   Diagnostic bilateral GONB #2 Palliative bilateral cervical facet blocks #5 Palliative bilateral cervical facet RFA #2  Palliative right L2-3 interlaminar LESI #3  Palliative right L1 TFESI #2 Bilateral lumbar spinal cord stimulator trial (done)  Diagnostic bilateral lumbar facet block #4  Diagnostic bilateral SI joint block #3  Palliative left lumbar facet RFA #2 (last done 12/28/2019)       Recent Visits Date Type Provider Dept  06/13/20 Telemedicine Gillis Santa, MD [facility]  Showing recent  visits within past 90 days and meeting all other requirements Today's Visits Date Type Provider Dept  08/23/20 Office Visit Milinda Pointer, MD [facility]  Showing today's visits and meeting all other requirements Future Appointments Date Type Provider Dept  08/24/20 Appointment Milinda Pointer, MD [facility]  Showing future appointments within next 90 days and meeting all other requirements  I discussed the assessment and treatment plan with the patient. The patient was  provided an opportunity to ask questions and all were answered. The patient agreed with the plan and demonstrated an understanding of the instructions.  Patient advised to call back or seek an in-person evaluation if the symptoms or condition worsens.  Duration of encounter: 30 minutes.  Note by: Gaspar Cola, MD Date: 08/23/2020; Time: 4:26 PM

## 2020-08-23 ENCOUNTER — Ambulatory Visit: Payer: Medicare PPO | Attending: Pain Medicine | Admitting: Pain Medicine

## 2020-08-23 ENCOUNTER — Encounter: Payer: Self-pay | Admitting: Pain Medicine

## 2020-08-23 ENCOUNTER — Other Ambulatory Visit: Payer: Self-pay

## 2020-08-23 VITALS — BP 115/70 | HR 72 | Temp 97.4°F | Ht 61.0 in | Wt 150.0 lb

## 2020-08-23 DIAGNOSIS — M7918 Myalgia, other site: Secondary | ICD-10-CM | POA: Diagnosis present

## 2020-08-23 DIAGNOSIS — K5903 Drug induced constipation: Secondary | ICD-10-CM | POA: Diagnosis present

## 2020-08-23 DIAGNOSIS — M5137 Other intervertebral disc degeneration, lumbosacral region: Secondary | ICD-10-CM | POA: Diagnosis present

## 2020-08-23 DIAGNOSIS — M5030 Other cervical disc degeneration, unspecified cervical region: Secondary | ICD-10-CM | POA: Diagnosis present

## 2020-08-23 DIAGNOSIS — M25552 Pain in left hip: Secondary | ICD-10-CM | POA: Insufficient documentation

## 2020-08-23 DIAGNOSIS — Z9689 Presence of other specified functional implants: Secondary | ICD-10-CM | POA: Diagnosis present

## 2020-08-23 DIAGNOSIS — M5441 Lumbago with sciatica, right side: Secondary | ICD-10-CM | POA: Diagnosis present

## 2020-08-23 DIAGNOSIS — Z79899 Other long term (current) drug therapy: Secondary | ICD-10-CM | POA: Insufficient documentation

## 2020-08-23 DIAGNOSIS — T85848S Pain due to other internal prosthetic devices, implants and grafts, sequela: Secondary | ICD-10-CM | POA: Insufficient documentation

## 2020-08-23 DIAGNOSIS — M4802 Spinal stenosis, cervical region: Secondary | ICD-10-CM | POA: Diagnosis present

## 2020-08-23 DIAGNOSIS — T402X5A Adverse effect of other opioids, initial encounter: Secondary | ICD-10-CM | POA: Diagnosis present

## 2020-08-23 DIAGNOSIS — M542 Cervicalgia: Secondary | ICD-10-CM | POA: Insufficient documentation

## 2020-08-23 DIAGNOSIS — M5442 Lumbago with sciatica, left side: Secondary | ICD-10-CM

## 2020-08-23 DIAGNOSIS — M25551 Pain in right hip: Secondary | ICD-10-CM | POA: Diagnosis not present

## 2020-08-23 DIAGNOSIS — M25511 Pain in right shoulder: Secondary | ICD-10-CM | POA: Diagnosis present

## 2020-08-23 DIAGNOSIS — M5412 Radiculopathy, cervical region: Secondary | ICD-10-CM

## 2020-08-23 DIAGNOSIS — G8929 Other chronic pain: Secondary | ICD-10-CM | POA: Diagnosis present

## 2020-08-23 DIAGNOSIS — M5481 Occipital neuralgia: Secondary | ICD-10-CM

## 2020-08-23 DIAGNOSIS — G894 Chronic pain syndrome: Secondary | ICD-10-CM

## 2020-08-23 DIAGNOSIS — M25512 Pain in left shoulder: Secondary | ICD-10-CM | POA: Insufficient documentation

## 2020-08-23 MED ORDER — NALOXEGOL OXALATE 12.5 MG PO TABS: 12.5000 mg | ORAL_TABLET | Freq: Every day | ORAL | 5 refills | Status: DC

## 2020-08-23 MED ORDER — OXYCODONE HCL 5 MG PO TABS: 5.0000 mg | ORAL_TABLET | Freq: Two times a day (BID) | ORAL | 0 refills | Status: DC | PRN

## 2020-08-23 MED ORDER — LIDOCAINE 5 % EX OINT: 1.0000 "application " | TOPICAL_OINTMENT | Freq: Every day | CUTANEOUS | 99 refills | Status: DC | PRN

## 2020-08-23 MED ORDER — METHOCARBAMOL 500 MG PO TABS: 1000.0000 mg | ORAL_TABLET | Freq: Every day | ORAL | 2 refills | Status: DC

## 2020-08-23 NOTE — Patient Instructions (Signed)
____________________________________________________________________________________________  Preparing for Procedure with Sedation  Procedure appointments are limited to planned procedures: . No Prescription Refills. . No disability issues will be discussed. . No medication changes will be discussed.  Instructions: . Oral Intake: Do not eat or drink anything for at least 8 hours prior to your procedure. (Exception: Blood Pressure Medication. See below.) . Transportation: Unless otherwise stated by your physician, you may drive yourself after the procedure. . Blood Pressure Medicine: Do not forget to take your blood pressure medicine with a sip of water the morning of the procedure. If your Diastolic (lower reading)is above 100 mmHg, elective cases will be cancelled/rescheduled. . Blood thinners: These will need to be stopped for procedures. Notify our staff if you are taking any blood thinners. Depending on which one you take, there will be specific instructions on how and when to stop it. . Diabetics on insulin: Notify the staff so that you can be scheduled 1st case in the morning. If your diabetes requires high dose insulin, take only  of your normal insulin dose the morning of the procedure and notify the staff that you have done so. . Preventing infections: Shower with an antibacterial soap the morning of your procedure. . Build-up your immune system: Take 1000 mg of Vitamin C with every meal (3 times a day) the day prior to your procedure. . Antibiotics: Inform the staff if you have a condition or reason that requires you to take antibiotics before dental procedures. . Pregnancy: If you are pregnant, call and cancel the procedure. . Sickness: If you have a cold, fever, or any active infections, call and cancel the procedure. . Arrival: You must be in the facility at least 30 minutes prior to your scheduled procedure. . Children: Do not bring children with you. . Dress appropriately:  Bring dark clothing that you would not mind if they get stained. . Valuables: Do not bring any jewelry or valuables.  Reasons to call and reschedule or cancel your procedure: (Following these recommendations will minimize the risk of a serious complication.) . Surgeries: Avoid having procedures within 2 weeks of any surgery. (Avoid for 2 weeks before or after any surgery). . Flu Shots: Avoid having procedures within 2 weeks of a flu shots or . (Avoid for 2 weeks before or after immunizations). . Barium: Avoid having a procedure within 7-10 days after having had a radiological study involving the use of radiological contrast. (Myelograms, Barium swallow or enema study). . Heart attacks: Avoid any elective procedures or surgeries for the initial 6 months after a "Myocardial Infarction" (Heart Attack). . Blood thinners: It is imperative that you stop these medications before procedures. Let us know if you if you take any blood thinner.  . Infection: Avoid procedures during or within two weeks of an infection (including chest colds or gastrointestinal problems). Symptoms associated with infections include: Localized redness, fever, chills, night sweats or profuse sweating, burning sensation when voiding, cough, congestion, stuffiness, runny nose, sore throat, diarrhea, nausea, vomiting, cold or Flu symptoms, recent or current infections. It is specially important if the infection is over the area that we intend to treat. . Heart and lung problems: Symptoms that may suggest an active cardiopulmonary problem include: cough, chest pain, breathing difficulties or shortness of breath, dizziness, ankle swelling, uncontrolled high or unusually low blood pressure, and/or palpitations. If you are experiencing any of these symptoms, cancel your procedure and contact your primary care physician for an evaluation.  Remember:  Regular Business hours are:    Monday to Thursday 8:00 AM to 4:00 PM  Provider's  Schedule: Benedict Kue, MD:  Procedure days: Tuesday and Thursday 7:30 AM to 4:00 PM  Bilal Lateef, MD:  Procedure days: Monday and Wednesday 7:30 AM to 4:00 PM ____________________________________________________________________________________________    

## 2020-08-23 NOTE — Progress Notes (Signed)
Nursing Pain Medication Assessment:  Safety precautions to be maintained throughout the outpatient stay will include: orient to surroundings, keep bed in low position, maintain call bell within reach at all times, provide assistance with transfer out of bed and ambulation.  Medication Inspection Compliance: Pill count conducted under aseptic conditions, in front of the patient. Neither the pills nor the bottle was removed from the patient's sight at any time. Once count was completed pills were immediately returned to the patient in their original bottle.  Medication: Oxycodone IR Pill/Patch Count: 12 of 60 pills remain Pill/Patch Appearance: Markings consistent with prescribed medication Bottle Appearance: Standard pharmacy container. Clearly labeled. Filled Date: 8 / 4 / 21 Last Medication intake:  last dose a week agoSafety precautions to be maintained throughout the outpatient stay will include: orient to surroundings, keep bed in low position, maintain call bell within reach at all times, provide assistance with transfer out of bed and ambulation.

## 2020-08-24 ENCOUNTER — Encounter: Payer: Self-pay | Admitting: Pain Medicine

## 2020-08-24 ENCOUNTER — Ambulatory Visit
Admission: RE | Admit: 2020-08-24 | Discharge: 2020-08-24 | Disposition: A | Discharge status: Home / Self Care | Payer: Medicare PPO | Source: Ambulatory Visit | Attending: Pain Medicine | Admitting: Pain Medicine

## 2020-08-24 ENCOUNTER — Ambulatory Visit (HOSPITAL_BASED_OUTPATIENT_CLINIC_OR_DEPARTMENT_OTHER): Payer: Medicare PPO | Admitting: Pain Medicine

## 2020-08-24 VITALS — BP 118/67 | HR 64 | Temp 98.2°F | Resp 16 | Ht 61.0 in | Wt 150.0 lb

## 2020-08-24 DIAGNOSIS — G4486 Cervicogenic headache: Secondary | ICD-10-CM | POA: Diagnosis present

## 2020-08-24 DIAGNOSIS — M542 Cervicalgia: Secondary | ICD-10-CM | POA: Insufficient documentation

## 2020-08-24 DIAGNOSIS — M4802 Spinal stenosis, cervical region: Secondary | ICD-10-CM

## 2020-08-24 DIAGNOSIS — M79602 Pain in left arm: Secondary | ICD-10-CM

## 2020-08-24 DIAGNOSIS — G8929 Other chronic pain: Secondary | ICD-10-CM

## 2020-08-24 DIAGNOSIS — M79601 Pain in right arm: Secondary | ICD-10-CM | POA: Insufficient documentation

## 2020-08-24 DIAGNOSIS — G8928 Other chronic postprocedural pain: Secondary | ICD-10-CM | POA: Insufficient documentation

## 2020-08-24 DIAGNOSIS — M5412 Radiculopathy, cervical region: Secondary | ICD-10-CM | POA: Insufficient documentation

## 2020-08-24 DIAGNOSIS — Z9889 Other specified postprocedural states: Secondary | ICD-10-CM | POA: Insufficient documentation

## 2020-08-24 MED ORDER — IOHEXOL 180 MG/ML  SOLN
10.0000 mL | Freq: Once | INTRAMUSCULAR | Status: AC
  Administered 2020-08-24: 10 mL via EPIDURAL
  Filled 2020-08-24: qty 20

## 2020-08-24 MED ORDER — LACTATED RINGERS IV SOLN
1000.0000 mL | Freq: Once | INTRAVENOUS | Status: AC
  Administered 2020-08-24: 1000 mL via INTRAVENOUS

## 2020-08-24 MED ORDER — FENTANYL CITRATE (PF) 100 MCG/2ML IJ SOLN
25.0000 ug | INTRAMUSCULAR | Status: AC | PRN
  Administered 2020-08-24: 50 ug via INTRAVENOUS
  Filled 2020-08-24: qty 2

## 2020-08-24 MED ORDER — ROPIVACAINE HCL 2 MG/ML IJ SOLN
1.0000 mL | Freq: Once | INTRAMUSCULAR | Status: AC
  Administered 2020-08-24: 1 mL via EPIDURAL
  Filled 2020-08-24: qty 10

## 2020-08-24 MED ORDER — MIDAZOLAM HCL 5 MG/5ML IJ SOLN
1.0000 mg | INTRAMUSCULAR | Status: AC | PRN
  Administered 2020-08-24: 1 mg via INTRAVENOUS
  Administered 2020-08-24: 2 mg via INTRAVENOUS
  Filled 2020-08-24: qty 5

## 2020-08-24 MED ORDER — DEXAMETHASONE SODIUM PHOSPHATE 10 MG/ML IJ SOLN
10.0000 mg | Freq: Once | INTRAMUSCULAR | Status: AC
  Administered 2020-08-24: 10 mg
  Filled 2020-08-24: qty 1

## 2020-08-24 MED ORDER — SODIUM CHLORIDE 0.9% FLUSH
1.0000 mL | Freq: Once | INTRAVENOUS | Status: AC
  Administered 2020-08-24: 1 mL

## 2020-08-24 MED ORDER — LIDOCAINE HCL 2 % IJ SOLN
20.0000 mL | Freq: Once | INTRAMUSCULAR | Status: AC
  Administered 2020-08-24: 400 mg
  Filled 2020-08-24: qty 20

## 2020-08-24 MED ORDER — SODIUM CHLORIDE (PF) 0.9 % IJ SOLN
INTRAMUSCULAR | Status: AC
  Filled 2020-08-24: qty 10

## 2020-08-24 NOTE — Progress Notes (Signed)
PROVIDER NOTE: Information contained herein reflects review and annotations entered in association with encounter. Interpretation of such information and data should be left to medically-trained personnel. Information provided to patient can be located elsewhere in the medical record under "Patient Instructions". Document created using STT-dictation technology, any transcriptional errors that may result from process are unintentional.    Patient: Karen Edwards  Service Category: Procedure  Provider: Gaspar Cola, MD  DOB: 1966-08-02  DOS: 08/24/2020  Location: [facility]  MRN: [id number]  Setting: Ambulatory - outpatient  Referring Provider: Pleas Koch, NP  Type: Established Patient  Specialty: Interventional Pain Management  PCP: Pleas Koch, NP   Primary Reason for Visit: Interventional Pain Management Treatment. CC: Neck Pain  Procedure:          Anesthesia, Analgesia, Anxiolysis:  Type: Diagnostic, Inter-Laminar, Cervical Epidural Steroid Injection  #1  Region: Posterior Cervico-thoracic Region Level: C7-T1 Laterality: Right-Sided Paramedial  Type: Moderate (Conscious) Sedation combined with Local Anesthesia Indication(s): Analgesia and Anxiety Route: Intravenous (IV) IV Access: Secured Sedation: Meaningful verbal contact was maintained at all times during the procedure  Local Anesthetic: Lidocaine 1-2%  Position: Prone with head of the table was raised to facilitate breathing.   Indications: 1. Cervicalgia   2. Cervical foraminal stenosis (C5-6) (Bilateral)   3. Chronic cervical radicular pain (Right: C6/C7) (Left: C5/T1)   4. Chronic neck pain with history of cervical spinal surgery   5. Cervicogenic headache (Bilateral)   6. Chronic upper extremity pain (Bilateral) (R>L)   7. Cervical radiculopathy at C6 (Bilateral)    Pain Score: Pre-procedure: 5 /10 Post-procedure: 2 /10   Pre-op Assessment:  Karen Edwards is a 54 y.o. (year old),  female patient, seen today for interventional treatment. She  has a past surgical history that includes Cesarean section; Ankle surgery (Right, 03/02/2014); Hemorrhoid surgery (N/A, 06/16/2015); Cervical spine surgery (02/21/2016); Cesarean section with bilateral tubal ligation (1990); Colonoscopy (04/19/2015); Hysteroscopy with D & C (12/24/2013); Colonoscopy with propofol (N/A, 05/18/2018); Esophagogastroduodenoscopy (egd) with propofol (N/A, 05/18/2018); Spinal cord stimulator insertion (N/A, 06/18/2018); Tubal ligation; and Lumbar spinal cord simulator revision (N/A, 03/14/2020). Karen Edwards has a current medication list which includes the following prescription(s): acetaminophen, biotin, fluoxetine, lidocaine, methocarbamol, naloxegol oxalate, fish oil, ondansetron, [START ON 08/29/2020] oxycodone, [START ON 09/28/2020] oxycodone, [START ON 10/28/2020] oxycodone, scopolamine, trazodone, and turmeric. Her primarily concern today is the Neck Pain  Initial Vital Signs:  Pulse/HCG Rate: 70ECG Heart Rate: 62 Temp: 97.8 F (36.6 C) Resp: 19 BP: 118/76 SpO2: 99 %  BMI: Estimated body mass index is 28.34 kg/m as calculated from the following:   Height as of this encounter: 5\' 1"  (1.549 m).   Weight as of this encounter: 150 lb (68 kg).  Risk Assessment: Allergies: Reviewed. She has No Known Allergies.  Allergy Precautions: None required Coagulopathies: Reviewed. None identified.  Blood-thinner therapy: None at this time Active Infection(s): Reviewed. None identified. Karen Edwards is afebrile  Site Confirmation: Karen Edwards was asked to confirm the procedure and laterality before marking the site Procedure checklist: Completed Consent: Before the procedure and under the influence of no sedative(s), amnesic(s), or anxiolytics, the patient was informed of the treatment options, risks and possible complications. To fulfill our ethical and legal obligations, as recommended by the American Medical  Association's Code of Ethics, I have informed the patient of my clinical impression; the nature and purpose of the treatment or procedure; the risks, benefits, and possible complications of the intervention; the alternatives,  including doing nothing; the risk(s) and benefit(s) of the alternative treatment(s) or procedure(s); and the risk(s) and benefit(s) of doing nothing. The patient was provided information about the general risks and possible complications associated with the procedure. These may include, but are not limited to: failure to achieve desired goals, infection, bleeding, organ or nerve damage, allergic reactions, paralysis, and death. In addition, the patient was informed of those risks and complications associated to Spine-related procedures, such as failure to decrease pain; infection (i.e.: Meningitis, epidural or intraspinal abscess); bleeding (i.e.: epidural hematoma, subarachnoid hemorrhage, or any other type of intraspinal or peri-dural bleeding); organ or nerve damage (i.e.: Any type of peripheral nerve, nerve root, or spinal cord injury) with subsequent damage to sensory, motor, and/or autonomic systems, resulting in permanent pain, numbness, and/or weakness of one or several areas of the body; allergic reactions; (i.e.: anaphylactic reaction); and/or death. Furthermore, the patient was informed of those risks and complications associated with the medications. These include, but are not limited to: allergic reactions (i.e.: anaphylactic or anaphylactoid reaction(s)); adrenal axis suppression; blood sugar elevation that in diabetics may result in ketoacidosis or comma; water retention that in patients with history of congestive heart failure may result in shortness of breath, pulmonary edema, and decompensation with resultant heart failure; weight gain; swelling or edema; medication-induced neural toxicity; particulate matter embolism and blood vessel occlusion with resultant organ, and/or  nervous system infarction; and/or aseptic necrosis of one or more joints. Finally, the patient was informed that Medicine is not an exact science; therefore, there is also the possibility of unforeseen or unpredictable risks and/or possible complications that may result in a catastrophic outcome. The patient indicated having understood very clearly. We have given the patient no guarantees and we have made no promises. Enough time was given to the patient to ask questions, all of which were answered to the patient's satisfaction. Ms. Sarno has indicated that she wanted to continue with the procedure. Attestation: I, the ordering provider, attest that I have discussed with the patient the benefits, risks, side-effects, alternatives, likelihood of achieving goals, and potential problems during recovery for the procedure that I have provided informed consent. Date  Time: 08/24/2020  1:13 PM  Pre-Procedure Preparation:  Monitoring: As per clinic protocol. Respiration, ETCO2, SpO2, BP, heart rate and rhythm monitor placed and checked for adequate function Safety Precautions: Patient was assessed for positional comfort and pressure points before starting the procedure. Time-out: I initiated and conducted the "Time-out" before starting the procedure, as per protocol. The patient was asked to participate by confirming the accuracy of the "Time Out" information. Verification of the correct person, site, and procedure were performed and confirmed by me, the nursing staff, and the patient. "Time-out" conducted as per Joint Commission's Universal Protocol (UP.01.01.01). Time: 1355  Description of Procedure:          Target Area: For Epidural Steroid injections the target is the interlaminar space, initially targeting the lower border of the superior vertebral body lamina. Approach: Paramedial approach. Area Prepped: Entire PosteriorCervical Region DuraPrep (Iodine Povacrylex [0.7% available iodine] and  Isopropyl Alcohol, 74% w/w) Safety Precautions: Aspiration looking for blood return was conducted prior to all injections. At no point did we inject any substances, as a needle was being advanced. No attempts were made at seeking any paresthesias. Safe injection practices and needle disposal techniques used. Medications properly checked for expiration dates. SDV (single dose vial) medications used. Description of the Procedure: Protocol guidelines were followed. The procedure needle was introduced  through the skin, ipsilateral to the reported pain, and advanced to the target area. Bone was contacted and the needle walked caudad, until the lamina was cleared. The epidural space was identified using "loss-of-resistance technique" with 2-3 ml of PF-NaCl (0.9% NSS), in a 5cc LOR glass syringe. Vitals:   08/24/20 1400 08/24/20 1407 08/24/20 1417 08/24/20 1427  BP: 122/68 120/69 120/71 118/67  Pulse: 64     Resp: 18 18 17 16   Temp:  98.1 F (36.7 C)  98.2 F (36.8 C)  TempSrc:  Temporal  Temporal  SpO2: 96% 95% 93% 95%  Weight:      Height:        Start Time: 1355 hrs. End Time:   hrs. Materials:  Needle(s) Type: Epidural needle Gauge: 17G Length: 3.5-in Medication(s): Please see orders for medications and dosing details.  Imaging Guidance (Spinal):          Type of Imaging Technique: Fluoroscopy Guidance (Spinal) Indication(s): Assistance in needle guidance and placement for procedures requiring needle placement in or near specific anatomical locations not easily accessible without such assistance. Exposure Time: Please see nurses notes. Contrast: Before injecting any contrast, we confirmed that the patient did not have an allergy to iodine, shellfish, or radiological contrast. Once satisfactory needle placement was completed at the desired level, radiological contrast was injected. Contrast injected under live fluoroscopy. No contrast complications. See chart for type and volume of contrast  used. Fluoroscopic Guidance: I was personally present during the use of fluoroscopy. "Tunnel Vision Technique" used to obtain the best possible view of the target area. Parallax error corrected before commencing the procedure. "Direction-depth-direction" technique used to introduce the needle under continuous pulsed fluoroscopy. Once target was reached, antero-posterior, oblique, and lateral fluoroscopic projection used confirm needle placement in all planes. Images permanently stored in EMR. Interpretation: I personally interpreted the imaging intraoperatively. Adequate needle placement confirmed in multiple planes. Appropriate spread of contrast into desired area was observed. No evidence of afferent or efferent intravascular uptake. No intrathecal or subarachnoid spread observed. Permanent images saved into the patient's record.  Antibiotic Prophylaxis:   Anti-infectives (From admission, onward)   None     Indication(s): None identified  Post-operative Assessment:  Post-procedure Vital Signs:  Pulse/HCG Rate: 6460 Temp: 98.2 F (36.8 C) Resp: 16 BP: 118/67 SpO2: 95 %  EBL: None  Complications: No immediate post-treatment complications observed by team, or reported by patient.  Note: The patient tolerated the entire procedure well. A repeat set of vitals were taken after the procedure and the patient was kept under observation following institutional policy, for this type of procedure. Post-procedural neurological assessment was performed, showing return to baseline, prior to discharge. The patient was provided with post-procedure discharge instructions, including a section on how to identify potential problems. Should any problems arise concerning this procedure, the patient was given instructions to immediately contact us, at any time, without hesitation. In any case, we plan to contact the patient by telephone for a follow-up status report regarding this interventional  procedure.  Comments:  No additional relevant information.  Plan of Care  Orders:  Orders Placed This Encounter  Procedures  . Cervical Epidural Injection    Procedure: Cervical Epidural Steroid Injection/Block Purpose: Therapeutic Indication(s): Radiculitis and cervicalgia associater with cervical degenerative disc disease.    Scheduling Instructions:     Level(s): C7-T1     Laterality: Right-sided     Sedation: Patient's choice.     Timeframe: Today    Order Specific Question:  Where will this procedure be performed?    Answer:   [facility]    Comments:   by Dr. Dossie Arbour  . [facility] C-ARM 1-60 MIN NO REPORT    Intraoperative interpretation by procedural physician at [place].    Standing Status:   Standing    Number of Occurrences:   1    Order Specific Question:   Reason for exam:    Answer:   Assistance in needle guidance and placement for procedures requiring needle placement in or near specific anatomical locations not easily accessible without such assistance.  . Informed Consent Details: Physician/Practitioner Attestation; Transcribe to consent form and obtain patient signature    Nursing Order: Transcribe to consent form and obtain patient signature. Note: Always confirm laterality of pain with Ms. Tye Savoy, before procedure.    Order Specific Question:   Physician/Practitioner attestation of informed consent for procedure/surgical case    Answer:   I, the physician/practitioner, attest that I have discussed with the patient the benefits, risks, side effects, alternatives, likelihood of achieving goals and potential problems during recovery for the procedure that I have provided informed consent.    Order Specific Question:   Procedure    Answer:   Cervical Epidural Steroid Injection (CESI) under fluoroscopic guidance    Order Specific Question:   Physician/Practitioner performing the procedure    Answer:   Daney Moor A. Dossie Arbour MD    Order  Specific Question:   Indication/Reason    Answer:   Cervicalgia (Neck Pain) with or without Cervical Radiculopathy/Radiculitis (Arm/Shoulder Pain, Numbness, and/or weakness), secondary to Cervical and/or Cervicothoracic Degenerative Disc Disease (DDD), with or without Intervertebral Disc Displacement (IVD  . Provide equipment / supplies at bedside    "Epidural Tray" (Disposable  single use) Catheter: NOT required    Standing Status:   Standing    Number of Occurrences:   1    Order Specific Question:   Specify    Answer:   Epidural Tray   Chronic Opioid Analgesic:  Oxycodone IR 5 mg, 1 tab PO BID (10 mg/day of oxycodone) MME/day:15mg /day.   Medications ordered for procedure: Meds ordered this encounter  Medications  . iohexol (OMNIPAQUE) 180 MG/ML injection 10 mL    Must be Myelogram-compatible. If not available, you may substitute with a water-soluble, non-ionic, hypoallergenic, myelogram-compatible radiological contrast medium.  Marland Kitchen lidocaine (XYLOCAINE) 2 % (with pres) injection 400 mg  . lactated ringers infusion 1,000 mL  . midazolam (VERSED) 5 MG/5ML injection 1-2 mg    Make sure Flumazenil is available in the pyxis when using this medication. If oversedation occurs, administer 0.2 mg IV over 15 sec. If after 45 sec no response, administer 0.2 mg again over 1 min; may repeat at 1 min intervals; not to exceed 4 doses (1 mg)  . fentaNYL (SUBLIMAZE) injection 25-50 mcg    Make sure Narcan is available in the pyxis when using this medication. In the event of respiratory depression (RR< 8/min): Titrate NARCAN (naloxone) in increments of 0.1 to 0.2 mg IV at 2-3 minute intervals, until desired degree of reversal.  . sodium chloride flush (NS) 0.9 % injection 1 mL  . ropivacaine (PF) 2 mg/mL (0.2%) (NAROPIN) injection 1 mL  . dexamethasone (DECADRON) injection 10 mg   Medications administered: We administered iohexol, lidocaine, lactated ringers, midazolam, fentaNYL, sodium chloride  flush, ropivacaine (PF) 2 mg/mL (0.2%), and dexamethasone.  See the medical record for exact dosing, route, and time of administration.  Follow-up plan:  Return in about 2 weeks (around 09/07/2020) for (VV), (PP) Follow-up.       Considering:   Possible revision of spinal cord stimulator battery implant to reposition the battery deeper. Possible right lumbar facet RFA #1 Possible bilateral SI joint RFA #1 Diagnostic bilateral IA hip joint injection Diagnostic bilateral femoral + obturator NB Possible bilateral femoral + obturator nerve RFA Diagnostic right L1-2LESI Diagnostic [id number] Possible bilateral occipital nerve RFA #1 Diagnostic bilateral IA shoulder joint injection Diagnostic bilateral suprascapular NB Possible bilateral suprascapular nerve RFA   Palliative PRN treatment(s):   Therapeutic right cervical ESI #2  Diagnostic bilateral GONB #2(last done 08/19/2019) Palliative bilateral cervical facet blocks #5 Palliative right cervical facet RFA #2 (last done 09/17/2018) Palliative left cervical facet RFA #2 (last done 05/04/2019) Palliative right L2-3 interlaminar LESI #3  Palliative right L1 TFESI #2 Bilateral lumbar spinal cord stimulator trial (done) (done on 05/14/2018) Diagnostic bilateral lumbar facet block #4  Diagnostic bilateral SI joint block #3  Palliative left lumbar facet RFA #2 (last done 12/28/2019)  Palliative right lumbar facet RFA #2 (last done 01/11/2020)    Recent Visits Date Type Provider Dept  08/24/20 Procedure visit Milinda Pointer, MD [facility]  08/23/20 Office Visit Milinda Pointer, MD [facility]  06/13/20 Telemedicine Gillis Santa, MD [facility]  Showing recent visits within past 90 days and meeting all other requirements Future Appointments Date Type Provider Dept  09/05/20 Appointment Milinda Pointer, MD [facility]  Showing future appointments within next 90 days  and meeting all other requirements  Disposition: Discharge home  Discharge (Date  Time): 08/24/2020; 1434 hrs.   Primary Care Physician: Pleas Koch, NP Location: [facility] Note by: Gaspar Cola, MD Date: 08/24/2020; Time: 8:04 AM  Disclaimer:  Medicine is not an Chief Strategy Officer. The only guarantee in medicine is that nothing is guaranteed. It is important to note that the decision to proceed with this intervention was based on the information collected from the patient. The Data and conclusions were drawn from the patient's questionnaire, the interview, and the physical examination. Because the information was provided in large part by the patient, it cannot be guaranteed that it has not been purposely or unconsciously manipulated. Every effort has been made to obtain as much relevant data as possible for this evaluation. It is important to note that the conclusions that lead to this procedure are derived in large part from the available data. Always take into account that the treatment will also be dependent on availability of resources and existing treatment guidelines, considered by other Pain Management Practitioners as being common knowledge and practice, at the time of the intervention. For Medico-Legal purposes, it is also important to point out that variation in procedural techniques and pharmacological choices are the acceptable norm. The indications, contraindications, technique, and results of the above procedure should only be interpreted and judged by a Board-Certified Interventional Pain Specialist with extensive familiarity and expertise in the same exact procedure and technique.

## 2020-08-24 NOTE — Progress Notes (Signed)
Safety precautions to be maintained throughout the outpatient stay will include: orient to surroundings, keep bed in low position, maintain call bell within reach at all times, provide assistance with transfer out of bed and ambulation.  

## 2020-08-24 NOTE — Patient Instructions (Signed)

## 2020-08-25 ENCOUNTER — Telehealth: Payer: Self-pay | Admitting: *Deleted

## 2020-08-25 NOTE — Telephone Encounter (Signed)
Spoke with patient re; procedure on yesterday.  States right side is better and left side continues to have pain.  Patient had right sided cervical epidural. No other concerns.

## 2020-09-04 ENCOUNTER — Telehealth: Payer: Self-pay

## 2020-09-04 ENCOUNTER — Encounter: Payer: Self-pay | Admitting: Pain Medicine

## 2020-09-04 NOTE — Telephone Encounter (Signed)
LM for patient to call office and discuss pre virtual appointment questions.

## 2020-09-04 NOTE — Progress Notes (Signed)
Patient: Karen Edwards  Service Category: E/M  Provider: Gaspar Cola, MD  DOB: Oct 25, 1966  DOS: 09/05/2020  Location: Office  MRN: [id number]  Setting: Ambulatory outpatient  Referring Provider: Pleas Koch, NP  Type: Established Patient  Specialty: Interventional Pain Management  PCP: Pleas Koch, NP  Location: Remote location  Delivery: TeleHealth     Virtual Encounter - Pain Management PROVIDER NOTE: Information contained herein reflects review and annotations entered in association with encounter. Interpretation of such information and data should be left to medically-trained personnel. Information provided to patient can be located elsewhere in the medical record under "Patient Instructions". Document created using STT-dictation technology, any transcriptional errors that may result from process are unintentional.    Contact & Pharmacy Preferred: [phone] Home: [phone] (home) Mobile: [phone] (mobile) E-mail: [email or web address]  [address], [place] [address] Phone: [phone] Fax: [phone]   Pre-screening  Ms. Condron offered "in-person" vs "virtual" encounter. She indicated preferring virtual for this encounter.   Reason COVID-19*   Social distancing based on CDC and AMA recommendations.   I contacted Karen Edwards on 09/05/2020 via telephone.      I clearly identified myself as Gaspar Cola, MD. I verified that I was speaking with the correct person using two identifiers (Name: SANTOS HARDWICK, and date of birth: 1966-09-01).  Consent I sought verbal advanced consent from Karen Edwards for virtual visit interactions. I informed Ms. Bardsley of possible security and privacy concerns, risks, and limitations associated with providing "not-in-person" medical evaluation and management services. I also informed Ms. Lafave of the availability of "in-person"  appointments. Finally, I informed her that there would be a charge for the virtual visit and that she could be  personally, fully or partially, financially responsible for it. Ms. Hafen expressed understanding and agreed to proceed.   Historic Elements   Ms. YARA TOMKINSON is a 54 y.o. year old, female patient evaluated today after our last contact on 08/24/2020. Ms. Butrick  has a past medical history of Anxiety, Chronic back pain, Degenerative joint disease, Depression, Headache, Hypotension, IBS (irritable bowel syndrome) (05/2015), and MVP (mitral valve prolapse). She also  has a past surgical history that includes Cesarean section; Ankle surgery (Right, 03/02/2014); Hemorrhoid surgery (N/A, 06/16/2015); Cervical spine surgery (02/21/2016); Cesarean section with bilateral tubal ligation (1990); Colonoscopy (04/19/2015); Hysteroscopy with D & C (12/24/2013); Colonoscopy with propofol (N/A, 05/18/2018); Esophagogastroduodenoscopy (egd) with propofol (N/A, 05/18/2018); Spinal cord stimulator insertion (N/A, 06/18/2018); Tubal ligation; and Lumbar spinal cord simulator revision (N/A, 03/14/2020). Ms. Alcalde has a current medication list which includes the following prescription(s): acetaminophen, biotin, fluoxetine, lidocaine, methocarbamol, naloxegol oxalate, fish oil, ondansetron, oxycodone, [START ON 09/28/2020] oxycodone, [START ON 10/28/2020] oxycodone, scopolamine, trazodone, and turmeric. She  reports that she has been smoking cigarettes. She has a 18.50 pack-year smoking history. She has never used smokeless tobacco. She reports current alcohol use of about 1.0 standard drink of alcohol per week. She reports that she does not use drugs. Ms. Ehlert has No Known Allergies.   HPI  Today, she is being contacted for a post-procedure assessment.  When I called patient today she sounded horrible.  Apparently she is get the flu with runny nose and sore throat.  She refers that the right-sided cervical  epidural did great.  She had 100% relief of the neck pain and arm pain for the duration of the local anesthetic which she  indicated seem to have lasted about 4 to 6 hours.  After that the pain started coming back and currently she has an ongoing 50% benefit.  For now, since she is sick, we are not planning on doing anything else, but I did talk to her about the possibility of completing a series of 3 cervical epidural injections to see if we could get this better.  I told the patient to give [place] a call as soon as she is over this "cold".  She denies having been in contact with anybody known to have Covid and states she also went to have the test done but she was told that it was too soon to do the testing.  Post-Procedure Evaluation  Procedure (08/24/2020): Diagnostic right cervical ESI #1 under fluoroscopic guidance and IV sedation Pre-procedure pain level: 5/10 Post-procedure: 2/10 (> 50% relief)  Sedation: Sedation provided.  Effectiveness during initial hour after procedure(Ultra-Short Term Relief):   100% of both, the cervicalgia and the upper extremity symptoms.  Local anesthetic used: Long-acting (4-6 hours) Effectiveness: Defined as any analgesic benefit obtained secondary to the administration of local anesthetics. This carries significant diagnostic value as to the etiological location, or anatomical origin, of the pain. Duration of benefit is expected to coincide with the duration of the local anesthetic used.  Effectiveness during initial 4-6 hours after procedure(Short-Term Relief):   100%.  Long-term benefit: Defined as any relief past the pharmacologic duration of the local anesthetics.  Effectiveness past the initial 6 hours after procedure(Long-Term Relief):   50% ongoing of both, the cervicalgia and their upper extremity symptoms.  Current benefits: Defined as benefit that persist at this time.   Analgesia:  50% improved Function: Somewhat improved ROM: Somewhat  improved  Pharmacotherapy Assessment  Analgesic: Oxycodone IR 5 mg, 1 tab PO BID (10 mg/day of oxycodone) MME/day:63m/day.   Monitoring: [place] PMP: PDMP reviewed during this encounter.       Pharmacotherapy: No side-effects or adverse reactions reported. Compliance: No problems identified. Effectiveness: Clinically acceptable. Plan: Refer to "POC".  UDS:  Summary  Date Value Ref Range Status  04/06/2018 FINAL  Final    Comment:    ==================================================================== TOXASSURE SELECT 13 (MW) ==================================================================== Test                             Result       Flag       Units Drug Present and Declared for Prescription Verification   Norhydrocodone                 228          EXPECTED   ng/mg creat    Norhydrocodone is an expected metabolite of hydrocodone. Drug Absent but Declared for Prescription Verification   Hydrocodone                    Not Detected UNEXPECTED ng/mg creat    Hydrocodone is almost always present in patients taking this drug    consistently. Absence of hydrocodone could be due to lapse of    time since the last dose or unusual pharmacokinetics (rapid    metabolism). ==================================================================== Test                      Result    Flag   Units      Ref Range   Creatinine  60               mg/dL      >=20 ==================================================================== Declared Medications:  The flagging and interpretation on this report are based on the  following declared medications.  Unexpected results may arise from  inaccuracies in the declared medications.  **Note: The testing scope of this panel includes these medications:  Hydrocodone (Norco)  **Note: The testing scope of this panel does not include following  reported medications:  Acetaminophen (Norco)  Atorvastatin (Lipitor)  Cyanocobalamin  Fluoxetine  (Prozac)  Methocarbamol (Robaxin)  Potassium  Supplement  Trazodone (Desyrel) ==================================================================== For clinical consultation, please call [phone]. ====================================================================     Laboratory Chemistry Profile   Renal Lab Results  Component Value Date   BUN 7 09/08/2017   CREATININE 0.84 09/08/2017   BCR 8 (L) 09/08/2017   GFR 76.93 05/20/2017   GFRAA 93 09/08/2017   GFRNONAA 81 09/08/2017     Hepatic Lab Results  Component Value Date   AST 18 09/08/2017   ALT 12 (L) 01/30/2017   ALBUMIN 4.7 09/08/2017   ALKPHOS 73 09/08/2017     Electrolytes Lab Results  Component Value Date   NA 139 09/08/2017   K 4.4 09/08/2017   CL 99 09/08/2017   CALCIUM 9.8 09/08/2017   MG 2.1 09/08/2017     Bone Lab Results  Component Value Date   VD25OH 43.74 01/10/2016   25OHVITD1 51 09/08/2017   25OHVITD2 <1.0 09/08/2017   25OHVITD3 51 09/08/2017     Inflammation (CRP: Acute Phase) (ESR: Chronic Phase) Lab Results  Component Value Date   CRP 1.4 09/08/2017   ESRSEDRATE 11 09/08/2017       Note: Above Lab results reviewed.  Imaging  DG PAIN CLINIC C-ARM 1-60 MIN NO REPORT Fluoro was used, but no Radiologist interpretation will be provided.  Please refer to "NOTES" tab for provider progress note.  Assessment  The primary encounter diagnosis was Cervicalgia. Diagnoses of Chronic cervical radicular pain (Right: C6/C7) (Left: C5/T1), Cervicogenic headache (Bilateral), and Chronic upper extremity pain (Bilateral) (R>L) were also pertinent to this visit.  Plan of Care  Problem-specific:  No problem-specific Assessment & Plan notes found for this encounter.  Ms. ARBADELLA KIMBLER has a current medication list which includes the following long-term medication(s): fluoxetine, lidocaine, methocarbamol, naloxegol oxalate, oxycodone, [START ON 09/28/2020] oxycodone, [START ON 10/28/2020]  oxycodone, and trazodone.  Pharmacotherapy (Medications Ordered): No orders of the defined types were placed in this encounter.  Orders:  No orders of the defined types were placed in this encounter.  Follow-up plan:   No follow-ups on file.      Considering:   Possible revision of spinal cord stimulator battery implant to reposition the battery deeper. Possible right lumbar facet RFA #1 Possible bilateral SI joint RFA #1 Diagnostic bilateral IA hip joint injection Diagnostic bilateral femoral + obturator NB Possible bilateral femoral + obturator nerve RFA Diagnostic right L1-2LESI Diagnostic [id number] Possible bilateral occipital nerve RFA #1 Diagnostic bilateral IA shoulder joint injection Diagnostic bilateral suprascapular NB Possible bilateral suprascapular nerve RFA   Palliative PRN treatment(s):   Therapeutic right cervical ESI #2  Diagnostic bilateral GONB #2(last done 08/19/2019) Palliative bilateral cervical facet blocks #5 Palliative right cervical facet RFA #2 (last done 09/17/2018) Palliative left cervical facet RFA #2 (last done 05/04/2019) Palliative right L2-3 interlaminar LESI #3  Palliative right L1 TFESI #2 Bilateral lumbar spinal cord stimulator trial (done) (done on 05/14/2018) Diagnostic bilateral lumbar facet block #4  Diagnostic bilateral SI joint block #3  Palliative left lumbar facet RFA #2 (last done 12/28/2019)  Palliative right lumbar facet RFA #2 (last done 01/11/2020)     Recent Visits Date Type Provider Dept  08/24/20 Procedure visit Milinda Pointer, MD [facility]  08/23/20 Office Visit Milinda Pointer, MD [facility]  06/13/20 Telemedicine Gillis Santa, MD [facility]  Showing recent visits within past 90 days and meeting all other requirements Today's Visits Date Type Provider Dept  09/05/20 Telemedicine Milinda Pointer, MD [facility]  Showing today's visits and  meeting all other requirements Future Appointments No visits were found meeting these conditions. Showing future appointments within next 90 days and meeting all other requirements  I discussed the assessment and treatment plan with the patient. The patient was provided an opportunity to ask questions and all were answered. The patient agreed with the plan and demonstrated an understanding of the instructions.  Patient advised to call back or seek an in-person evaluation if the symptoms or condition worsens.  Duration of encounter: 12 minutes.  Note by: Gaspar Cola, MD Date: 09/05/2020; Time: 4:48 PM

## 2020-09-05 ENCOUNTER — Ambulatory Visit: Payer: Medicare PPO | Attending: Pain Medicine | Admitting: Pain Medicine

## 2020-09-05 ENCOUNTER — Other Ambulatory Visit: Payer: Self-pay

## 2020-09-05 DIAGNOSIS — M5412 Radiculopathy, cervical region: Secondary | ICD-10-CM | POA: Diagnosis not present

## 2020-09-05 DIAGNOSIS — G8929 Other chronic pain: Secondary | ICD-10-CM | POA: Diagnosis not present

## 2020-09-05 DIAGNOSIS — M542 Cervicalgia: Secondary | ICD-10-CM

## 2020-09-05 DIAGNOSIS — M79601 Pain in right arm: Secondary | ICD-10-CM

## 2020-09-05 DIAGNOSIS — M79602 Pain in left arm: Secondary | ICD-10-CM | POA: Diagnosis not present

## 2020-09-05 DIAGNOSIS — G4486 Cervicogenic headache: Secondary | ICD-10-CM | POA: Diagnosis not present

## 2020-09-08 ENCOUNTER — Other Ambulatory Visit: Payer: Self-pay | Admitting: Family Medicine

## 2020-09-08 DIAGNOSIS — F32A Depression, unspecified: Secondary | ICD-10-CM

## 2020-09-08 NOTE — Telephone Encounter (Deleted)
Pharmacy requests refill on: Fluoxetine 20 mg  LAST REFILL: 08/01/2020 LAST OV: 07/28/2019 NEXT OV: Not Scheduled PHARMACY: [id number] [place], [place]   Needs to schedule office visit prior to further refills.

## 2020-09-15 ENCOUNTER — Encounter: Payer: Self-pay | Admitting: Primary Care

## 2020-09-15 ENCOUNTER — Telehealth (INDEPENDENT_AMBULATORY_CARE_PROVIDER_SITE_OTHER): Payer: Medicare PPO | Admitting: Primary Care

## 2020-09-15 VITALS — Ht 61.0 in | Wt 140.0 lb

## 2020-09-15 DIAGNOSIS — E785 Hyperlipidemia, unspecified: Secondary | ICD-10-CM | POA: Diagnosis not present

## 2020-09-15 DIAGNOSIS — G4700 Insomnia, unspecified: Secondary | ICD-10-CM | POA: Diagnosis not present

## 2020-09-15 DIAGNOSIS — F32A Depression, unspecified: Secondary | ICD-10-CM

## 2020-09-15 DIAGNOSIS — T402X5A Adverse effect of other opioids, initial encounter: Secondary | ICD-10-CM

## 2020-09-15 DIAGNOSIS — K5909 Other constipation: Secondary | ICD-10-CM | POA: Diagnosis not present

## 2020-09-15 DIAGNOSIS — K5903 Drug induced constipation: Secondary | ICD-10-CM

## 2020-09-15 DIAGNOSIS — Z1231 Encounter for screening mammogram for malignant neoplasm of breast: Secondary | ICD-10-CM

## 2020-09-15 DIAGNOSIS — M47816 Spondylosis without myelopathy or radiculopathy, lumbar region: Secondary | ICD-10-CM | POA: Diagnosis not present

## 2020-09-15 DIAGNOSIS — G894 Chronic pain syndrome: Secondary | ICD-10-CM | POA: Diagnosis not present

## 2020-09-15 MED ORDER — FLUOXETINE HCL 20 MG PO CAPS: 60.0000 mg | ORAL_CAPSULE | Freq: Every day | ORAL | 3 refills | Status: DC

## 2020-09-15 MED ORDER — TRAZODONE HCL 100 MG PO TABS: 300.0000 mg | ORAL_TABLET | Freq: Every day | ORAL | 3 refills | Status: DC

## 2020-09-15 NOTE — Assessment & Plan Note (Signed)
Following with pain management, continue oxycodone 5 mg twice daily as needed, methocarbamol 1000 mg at night as needed.

## 2020-09-15 NOTE — Progress Notes (Signed)
Subjective:    Patient ID: Karen Edwards, female    DOB: 06/12/1966, 54 y.o.   MRN: [id number]  HPI  Virtual Visit via Video Note  I connected with Karen Edwards on 09/15/20 at  2:40 PM EST by a video enabled telemedicine application and verified that I am speaking with the correct person using two identifiers.  Location: Patient: Home Provider: Office Participants: Patient and myself   I discussed the limitations of evaluation and management by telemedicine and the availability of in person appointments. The patient expressed understanding and agreed to proceed.  History of Present Illness:  Karen Edwards is a 54 year old female who presents today for follow up of chronic conditions and medication refill.  She is also overdue for her mammogram and some routine lab work.  Colonoscopy completed in 2019, due in 2024.  1) Chronic Back/Neck Pain: Following with pain management, Dr. Dossie Arbour.  Currently managed on oxycodone 5 mg daily, methocarbamol 1000 mg at bedtime.  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  2) Depression: Currently managed on fluoxetine 20 mg, takes 3 capsules (60 mg) daily, trazodone 300 mg at bedtime for sleep.  Overall she feels well managed on her current regimen and is needing refills.  She denies any problems with her current regimen.    Observations/Objective:  Alert and oriented. Appears well, not sickly. No distress. Speaking in complete sentences.  Assessment and Plan:  See problem based charting.  Follow Up Instructions:  [facility].   Call our main office line to set up a lab appointment as discussed.  I will send refills of your medication to the pharmacy now.  Nice to see you!! Allie Bossier, NP-C    I discussed the assessment and treatment plan with the patient. The patient was provided an opportunity to ask questions and all were answered. The patient agreed with the plan and  demonstrated an understanding of the instructions.   The patient was advised to call back or seek an in-person evaluation if the symptoms worsen or if the condition fails to improve as anticipated.    Pleas Koch, NP    Review of Systems  Respiratory: Negative for shortness of breath.   Cardiovascular: Negative for chest pain.  Gastrointestinal: Positive for constipation.  Musculoskeletal: Positive for arthralgias.  Psychiatric/Behavioral:       See HPI       Past Medical History:  Diagnosis Date  . Anxiety   . Chronic back pain   . Degenerative joint disease   . Depression   . Headache    neck pain  . Hypotension   . IBS (irritable bowel syndrome) 05/2015  . MVP (mitral valve prolapse)    patient denies     Social History   Socioeconomic History  . Marital status: Widowed    Spouse name: Not on file  . Number of children: 2  . Years of education: Not on file  . Highest education level: Not on file  Occupational History  . Occupation: disabled  Tobacco Use  . Smoking status: Current Every Day Smoker    Packs/day: 0.50    Years: 37.00    Pack years: 18.50    Types: Cigarettes  . Smokeless tobacco: Never Used  Vaping Use  . Vaping Use: Never used  Substance and Sexual Activity  . Alcohol use: Yes    Alcohol/week: 1.0 standard drink    Types: 1 Cans of beer per  week    Comment: drinks an occasional beer  . Drug use: No  . Sexual activity: Yes    Birth control/protection: None, Surgical    Comment: tubal ligation  Other Topics Concern  . Not on file  Social History Narrative   Married.   2 children. One boy and one girl.   On disability.   Playing with her dogs, gardening.   Social Determinants of Health   Financial Resource Strain:   . Difficulty of Paying Living Expenses: Not on file  Food Insecurity:   . Worried About Charity fundraiser in the Last Year: Not on file  . Ran Out of Food in the Last Year: Not on file  Transportation  Needs:   . Lack of Transportation (Medical): Not on file  . Lack of Transportation (Non-Medical): Not on file  Physical Activity:   . Days of Exercise per Week: Not on file  . Minutes of Exercise per Session: Not on file  Stress:   . Feeling of Stress : Not on file  Social Connections:   . Frequency of Communication with Friends and Family: Not on file  . Frequency of Social Gatherings with Friends and Family: Not on file  . Attends Religious Services: Not on file  . Active Member of Clubs or Organizations: Not on file  . Attends Archivist Meetings: Not on file  . Marital Status: Not on file  Intimate Partner Violence:   . Fear of Current or Ex-Partner: Not on file  . Emotionally Abused: Not on file  . Physically Abused: Not on file  . Sexually Abused: Not on file    Past Surgical History:  Procedure Laterality Date  . ANKLE SURGERY Right 03/02/2014   repair of tendon and sural nerve right ankle  . CERVICAL SPINE SURGERY  02/21/2016   arthrodesis of anterior cervical spine (fusion of C5-6,C6-7 and insertion of bone allograft  . CESAREAN SECTION    . CESAREAN SECTION WITH BILATERAL TUBAL LIGATION  1990  . COLONOSCOPY  04/19/2015  . COLONOSCOPY WITH PROPOFOL N/A 05/18/2018   Procedure: COLONOSCOPY WITH PROPOFOL;  Surgeon: Lin Landsman, MD;  [facility];  Service: Gastroenterology;  Laterality: N/A;  . ESOPHAGOGASTRODUODENOSCOPY (EGD) WITH PROPOFOL N/A 05/18/2018   Procedure: ESOPHAGOGASTRODUODENOSCOPY (EGD) WITH PROPOFOL;  Surgeon: Lin Landsman, MD;  Location: [facility] ENDOSCOPY;  Service: Gastroenterology;  Laterality: N/A;  . HEMORRHOID SURGERY N/A 06/16/2015   Procedure: HEMORRHOIDECTOMY;  Surgeon: Leonie Green, MD;  Location: [facility] ORS;  Service: General;  Laterality: N/A;  . HYSTEROSCOPY WITH D & C  12/24/2013  . LUMBAR SPINAL CORD SIMULATOR REVISION N/A 03/14/2020   Procedure: LUMBAR SPINAL CORD SIMULATOR REVISION;  Surgeon: Milinda Pointer, MD;  Location: [facility] ORS;  Service: Neurosurgery;  Laterality: N/A;  . SPINAL CORD STIMULATOR INSERTION N/A 06/18/2018   Procedure: LUMBAR SPINAL CORD STIMULATOR INSERTION;  Surgeon: Milinda Pointer, MD;  Location: [facility] ORS;  Service: Neurosurgery;  Laterality: N/A;  . TUBAL LIGATION      Family History  Problem Relation Age of Onset  . Arthritis Mother   . Depression Mother   . Hyperlipidemia Mother   . Hypertension Mother   . Kidney disease Mother   . Vision loss Mother   . Hypothyroidism Mother   . Hearing loss Father   . Hyperlipidemia Father   . Hypertension Father   . Diabetes Mellitus I Maternal Grandmother   . Stroke Maternal Grandmother   . Congestive Heart Failure Maternal  Grandfather   . Heart attack Maternal Grandfather   . Prostate cancer Paternal Grandfather   . Thyroid disease Maternal Aunt   . Diabetes Maternal Aunt   . Gout Maternal Aunt     No Known Allergies  Current Outpatient Medications on File Prior to Visit  Medication Sig Dispense Refill  . acetaminophen (TYLENOL) 500 MG tablet Take 1,000 mg by mouth every 6 (six) hours as needed (for pain.).     Marland Kitchen Biotin 5000 MCG TABS Take 5,000 mcg by mouth daily.    Marland Kitchen lidocaine (XYLOCAINE) 5 % ointment Apply 1 application topically daily as needed for moderate pain. Wash hands thoroughly after applying 150 g PRN  . methocarbamol (ROBAXIN) 500 MG tablet Take 2 tablets (1,000 mg total) by mouth at bedtime. 60 tablet 2  . naloxegol oxalate (MOVANTIK) 12.5 MG TABS tablet Take 1 tablet (12.5 mg total) by mouth daily. Do not break tablet. Take on empty stomach 1 hour before or 2 hours after a meal. 30 tablet 5  . Omega-3 Fatty Acids (FISH OIL) 1200 MG CAPS Take 1,200 mg by mouth daily.    . ondansetron (ZOFRAN-ODT) 4 MG disintegrating tablet Take 4 mg by mouth every 4 (four) hours as needed (vertigo/dizziness).     Marland Kitchen oxyCODONE (OXY IR/ROXICODONE) 5 MG immediate release tablet Take 1 tablet (5 mg total) by mouth  2 (two) times daily as needed for severe pain. Must last 30 days. 60 tablet 0  . [START ON 09/28/2020] oxyCODONE (OXY IR/ROXICODONE) 5 MG immediate release tablet Take 1 tablet (5 mg total) by mouth 2 (two) times daily as needed for severe pain. Must last 30 days. 60 tablet 0  . [START ON 10/28/2020] oxyCODONE (OXY IR/ROXICODONE) 5 MG immediate release tablet Take 1 tablet (5 mg total) by mouth 2 (two) times daily as needed for severe pain. Must last 30 days. 60 tablet 0  . TURMERIC PO Take 1 capsule by mouth daily.    Marland Kitchen scopolamine (TRANSDERM-SCOP) 1 MG/3DAYS Place 1 patch onto the skin daily as needed (vertigo/dizziness).  (Patient not taking: Reported on 09/15/2020)     No current facility-administered medications on file prior to visit.    Ht 5\' 1"  (1.549 m)   Wt 140 lb (63.5 kg)   LMP 05/05/2017 (Exact Date)   BMI 26.45 kg/m    Objective:   Physical Exam Constitutional:      General: She is not in acute distress. Pulmonary:     Effort: Pulmonary effort is normal.  Neurological:     Mental Status: She is alert and oriented to person, place, and time.  Psychiatric:        Mood and Affect: Mood normal.            Assessment & Plan:

## 2020-09-15 NOTE — Assessment & Plan Note (Signed)
Appears to be doing well on fluoxetine 60 mg and trazodone 300 mg.  Continue current regimen.  Refill sent to pharmacy.

## 2020-09-15 NOTE — Assessment & Plan Note (Addendum)
Following with pain management who has her on Movantik 12.5 mg daily.  Continue current regimen.  Colonoscopy up-to-date, due in 2024.

## 2020-09-15 NOTE — Assessment & Plan Note (Signed)
Today she endorses doing well on trazodone 300 mg which helps to keep her asleep.  Continue same.

## 2020-09-15 NOTE — Assessment & Plan Note (Signed)
Following with pain management, feels well managed. Continue oxycodone 5 mg twice daily as needed, methocarbamol 1000 mg at bedtime as needed.

## 2020-09-15 NOTE — Assessment & Plan Note (Signed)
No recent labs on file, will repeat lipids, CMP. She will set up a lab appointment to come soon.

## 2020-09-15 NOTE — Assessment & Plan Note (Signed)
Following with pain management, continue Movantik 12.5 mg daily.

## 2020-09-19 ENCOUNTER — Encounter: Payer: Self-pay | Admitting: Primary Care

## 2020-11-14 ENCOUNTER — Telehealth: Payer: Self-pay | Admitting: Pain Medicine

## 2020-11-14 NOTE — Telephone Encounter (Signed)
Called patient and she states that she would like to have Left Lumbar Radiofrequency. Can you put an order in so the front staff can  get approved and an appt.

## 2020-11-14 NOTE — Telephone Encounter (Signed)
Patient wants to go ahead with next RFA. We need orders put in for this so [facility] can get PA done please.

## 2020-11-21 ENCOUNTER — Encounter: Payer: Self-pay | Admitting: Pain Medicine

## 2020-11-21 ENCOUNTER — Other Ambulatory Visit: Payer: Self-pay

## 2020-11-21 ENCOUNTER — Ambulatory Visit: Payer: Medicare PPO | Attending: Pain Medicine | Admitting: Pain Medicine

## 2020-11-21 DIAGNOSIS — M25551 Pain in right hip: Secondary | ICD-10-CM | POA: Diagnosis not present

## 2020-11-21 DIAGNOSIS — Z9689 Presence of other specified functional implants: Secondary | ICD-10-CM

## 2020-11-21 DIAGNOSIS — M5137 Other intervertebral disc degeneration, lumbosacral region: Secondary | ICD-10-CM

## 2020-11-21 DIAGNOSIS — Z9682 Presence of neurostimulator: Secondary | ICD-10-CM | POA: Diagnosis not present

## 2020-11-21 DIAGNOSIS — Z79899 Other long term (current) drug therapy: Secondary | ICD-10-CM

## 2020-11-21 DIAGNOSIS — M533 Sacrococcygeal disorders, not elsewhere classified: Secondary | ICD-10-CM

## 2020-11-21 DIAGNOSIS — G8929 Other chronic pain: Secondary | ICD-10-CM | POA: Diagnosis not present

## 2020-11-21 DIAGNOSIS — M5481 Occipital neuralgia: Secondary | ICD-10-CM

## 2020-11-21 DIAGNOSIS — M5126 Other intervertebral disc displacement, lumbar region: Secondary | ICD-10-CM | POA: Diagnosis not present

## 2020-11-21 DIAGNOSIS — M25552 Pain in left hip: Secondary | ICD-10-CM

## 2020-11-21 DIAGNOSIS — G894 Chronic pain syndrome: Secondary | ICD-10-CM | POA: Diagnosis not present

## 2020-11-21 DIAGNOSIS — M545 Low back pain, unspecified: Secondary | ICD-10-CM

## 2020-11-21 DIAGNOSIS — M47816 Spondylosis without myelopathy or radiculopathy, lumbar region: Secondary | ICD-10-CM

## 2020-11-21 DIAGNOSIS — F1120 Opioid dependence, uncomplicated: Secondary | ICD-10-CM | POA: Diagnosis not present

## 2020-11-21 MED ORDER — OXYCODONE HCL 5 MG PO TABS: 5.0000 mg | ORAL_TABLET | Freq: Two times a day (BID) | ORAL | 0 refills | Status: DC | PRN

## 2020-11-21 NOTE — Progress Notes (Signed)
Patient: Karen Edwards  Service Category: E/M  Provider: Gaspar Cola, MD  DOB: [birth year]  DOS: 11/21/2020  Location: Office  MRN: [id number]  Setting: Ambulatory outpatient  Referring Provider: Pleas Koch, NP  Type: Established Patient  Specialty: Interventional Pain Management  PCP: Pleas Koch, NP  Location: Remote location  Delivery: TeleHealth     Virtual Encounter - Pain Management PROVIDER NOTE: Information contained herein reflects review and annotations entered in association with encounter. Interpretation of such information and data should be left to medically-trained personnel. Information provided to patient can be located elsewhere in the medical record under "Patient Instructions". Document created using STT-dictation technology, any transcriptional errors that may result from process are unintentional.    Contact & Pharmacy Preferred: [phone] Home: [phone] (home) Mobile: [phone] (mobile) E-mail: [email or web address]  [address], [place] [address] Phone: [phone] Fax: [phone]   Pre-screening  Karen Edwards offered "in-person" vs "virtual" encounter. She indicated preferring virtual for this encounter.   Reason COVID-19*  Social distancing based on CDC and AMA recommendations.   I contacted Karen Edwards on 11/21/2020 via telephone.      I clearly identified myself as Gaspar Cola, MD. I verified that I was speaking with the correct person using two identifiers (Name: Karen Edwards, and date of birth: 15-Mar-1966).  Consent I sought verbal advanced consent from Karen Edwards for virtual visit interactions. I informed Karen Edwards of possible security and privacy concerns, risks, and limitations associated with providing "not-in-person" medical evaluation and management services. I also informed Karen Edwards of the availability of "in-person"  appointments. Finally, I informed her that there would be a charge for the virtual visit and that she could be  personally, fully or partially, financially responsible for it. Karen Edwards expressed understanding and agreed to proceed.   Historic Elements   Karen Edwards is a 55 y.o. year old, female patient evaluated today after our last contact on 11/14/2020. Karen Edwards  has a past medical history of Anxiety, Chronic back pain, Degenerative joint disease, Depression, Headache, Hypotension, IBS (irritable bowel syndrome) (05/2015), and MVP (mitral valve prolapse). She also  has a past surgical history that includes Cesarean section; Ankle surgery (Right, 03/02/2014); Hemorrhoid surgery (N/A, 06/16/2015); Cervical spine surgery (02/21/2016); Cesarean section with bilateral tubal ligation (1990); Colonoscopy (04/19/2015); Hysteroscopy with D & C (12/24/2013); Colonoscopy with propofol (N/A, 05/18/2018); Esophagogastroduodenoscopy (egd) with propofol (N/A, 05/18/2018); Spinal cord stimulator insertion (N/A, 06/18/2018); Tubal ligation; and Lumbar spinal cord simulator revision (N/A, 03/14/2020). Karen Edwards has a current medication list which includes the following prescription(s): acetaminophen, biotin, fluoxetine, lidocaine, methocarbamol, naloxegol oxalate, fish oil, ondansetron, scopolamine, trazodone, turmeric, and [START ON 11/27/2020] oxycodone. She  reports that she has been smoking cigarettes. She has a 18.50 pack-year smoking history. She has never used smokeless tobacco. She reports current alcohol use of about 1.0 standard drink of alcohol per week. She reports that she does not use drugs. Karen Edwards has No Known Allergies.   HPI  Today, she is being contacted for medication management.  The patient indicates doing well with the current medication regimen. No adverse reactions or side effects reported to the medications.   RTCB: 12/27/2020 Nonopioids transfer 08/23/2020: Lidocaine 5% ointment  and Robaxin  Pharmacotherapy Assessment  Analgesic: Oxycodone IR 5 mg, 1 tab PO BID (10 mg/day of oxycodone) MME/day:15mg /day.   Monitoring: [place] PMP: PDMP reviewed during this  encounter.       Pharmacotherapy: No side-effects or adverse reactions reported. Compliance: No problems identified. Effectiveness: Clinically acceptable. Plan: Refer to "POC".  UDS:  Summary  Date Value Ref Range Status  04/06/2018 FINAL  Final    Comment:    ==================================================================== TOXASSURE SELECT 13 (MW) ==================================================================== Test                             Result       Flag       Units Drug Present and Declared for Prescription Verification   Norhydrocodone                 228          EXPECTED   ng/mg creat    Norhydrocodone is an expected metabolite of hydrocodone. Drug Absent but Declared for Prescription Verification   Hydrocodone                    Not Detected UNEXPECTED ng/mg creat    Hydrocodone is almost always present in patients taking this drug    consistently. Absence of hydrocodone could be due to lapse of    time since the last dose or unusual pharmacokinetics (rapid    metabolism). ==================================================================== Test                      Result    Flag   Units      Ref Range   Creatinine              60               mg/dL      >=20 ==================================================================== Declared Medications:  The flagging and interpretation on this report are based on the  following declared medications.  Unexpected results may arise from  inaccuracies in the declared medications.  **Note: The testing scope of this panel includes these medications:  Hydrocodone (Norco)  **Note: The testing scope of this panel does not include following  reported medications:  Acetaminophen (Norco)  Atorvastatin (Lipitor)  Cyanocobalamin  Fluoxetine  (Prozac)  Methocarbamol (Robaxin)  Potassium  Supplement  Trazodone (Desyrel) ==================================================================== For clinical consultation, please call [phone]. ====================================================================     Laboratory Chemistry Profile   Renal Lab Results  Component Value Date   BUN 7 09/08/2017   CREATININE 0.84 09/08/2017   BCR 8 (L) 09/08/2017   GFR 76.93 05/20/2017   GFRAA 93 09/08/2017   GFRNONAA 81 09/08/2017     Hepatic Lab Results  Component Value Date   AST 18 09/08/2017   ALT 12 (L) 01/30/2017   ALBUMIN 4.7 09/08/2017   ALKPHOS 73 09/08/2017     Electrolytes Lab Results  Component Value Date   NA 139 09/08/2017   K 4.4 09/08/2017   CL 99 09/08/2017   CALCIUM 9.8 09/08/2017   MG 2.1 09/08/2017     Bone Lab Results  Component Value Date   VD25OH 43.74 01/10/2016   25OHVITD1 51 09/08/2017   25OHVITD2 <1.0 09/08/2017   25OHVITD3 51 09/08/2017     Inflammation (CRP: Acute Phase) (ESR: Chronic Phase) Lab Results  Component Value Date   CRP 1.4 09/08/2017   ESRSEDRATE 11 09/08/2017       Note: Above Lab results reviewed.  Imaging  DG PAIN CLINIC C-ARM 1-60 MIN NO REPORT Fluoro was used, but no Radiologist interpretation will be provided.  Please refer to "  NOTES" tab for provider progress note.  Assessment  The primary encounter diagnosis was Chronic pain syndrome. 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  Plan of Care  Problem-specific:  No problem-specific Assessment & Plan notes found for this encounter.  Ms. LINDA GRIMMER has a current medication list which includes the following long-term medication(s): fluoxetine, lidocaine, methocarbamol, naloxegol oxalate, trazodone, and [START ON 11/27/2020] oxycodone.  Pharmacotherapy (Medications Ordered): Meds ordered this encounter  Medications  . oxyCODONE (OXY IR/ROXICODONE) 5 MG immediate release tablet    Sig: Take 1 tablet (5 mg total) by mouth 2 (two) times daily as needed for severe pain. Must last 30 days.    Dispense:  60 tablet    Refill:  0    Chronic Pain: STOP Act (Not applicable) Fill 1 day early if closed on refill date. Avoid benzodiazepines within 8 hours of opioids   Orders:  No orders of the defined types were placed in this encounter.  Follow-up plan:   Return in about 5 weeks (around 12/27/2020) for (F2F), (Med Mgmt).      Considering:   Possible revision of spinal cord stimulator battery implant to reposition the battery deeper. Possible right lumbar facet RFA #1 Possible bilateral SI joint RFA #1 Diagnostic bilateral IA hip joint injection Diagnostic bilateral femoral + obturator NB Possible bilateral femoral + obturator nerve RFA Diagnostic right L1-2LESI Diagnostic [id number] Possible bilateral occipital nerve RFA #1 Diagnostic bilateral IA shoulder joint injection Diagnostic bilateral suprascapular NB Possible bilateral suprascapular nerve RFA   Palliative PRN treatment(s):   Therapeutic right cervical ESI #2  Diagnostic bilateral GONB #2(last done 08/19/2019) Palliative bilateral cervical facet blocks #5 Palliative right cervical facet RFA #2 (last done 09/17/2018) Palliative left cervical facet RFA #2 (last done 05/04/2019) Palliative right L2-3 interlaminar LESI #3  Palliative right L1 TFESI #2 Bilateral lumbar spinal cord stimulator trial  (done) (done on 05/14/2018) Diagnostic bilateral lumbar facet block #4  Diagnostic bilateral SI joint block #3  Palliative left lumbar facet RFA #2 (last done 12/28/2019)  Palliative right lumbar facet RFA #2 (last done 01/11/2020)      Recent Visits Date Type Provider Dept  09/05/20 Telemedicine Milinda Pointer, MD [facility]  08/24/20 Procedure visit Milinda Pointer, MD [facility]  08/23/20 Office Visit Milinda Pointer, MD [facility]  Showing recent visits within past 90 days and meeting all other requirements Today's Visits Date Type Provider Dept  11/21/20 Telemedicine Milinda Pointer, MD [facility]  Showing today's visits and meeting all other requirements Future Appointments No visits were found meeting these conditions. Showing future appointments within next 90 days and meeting all other requirements  I discussed the assessment and treatment plan with the patient. The patient was provided an opportunity to ask questions and all were answered. The patient agreed with the plan and demonstrated an understanding of the instructions.  Patient advised to call back or seek an in-person evaluation if the symptoms or condition worsens.  Duration of encounter: 12 minutes.  Note by: Gaspar Cola, MD Date: 11/21/2020; Time: 1:43 PM

## 2020-11-21 NOTE — Patient Instructions (Signed)
____________________________________________________________________________________________  Medication Rules  Purpose: To inform patients, and their family members, of our rules and regulations.  Applies to: All patients receiving prescriptions (written or electronic).  Pharmacy of record: Pharmacy where electronic prescriptions will be sent. If written prescriptions are taken to a different pharmacy, please inform the nursing staff. The pharmacy listed in the electronic medical record should be the one where you would like electronic prescriptions to be sent.  Electronic prescriptions: In compliance with the [place] [employer]reng[employer] Opioid Misuse Prevention (STOP) Act of 2017 (Session Law 2017-74/H243), effective October 28, 2018, all controlled substances must be electronically prescribed. Calling prescriptions to the pharmacy will cease to exist.  Prescription refills: Only during scheduled appointments. Applies to all prescriptions.  NOTE: The following applies primarily to controlled substances (Opioid* Pain Medications).   Type of encounter (visit): For patients receiving controlled substances, face-to-face visits are required. (Not an option or up to the patient.)  Patient's responsibilities: 1. Pain Pills: Bring all pain pills to every appointment (except for procedure appointments). 2. Pill Bottles: Bring pills in original pharmacy bottle. Always bring the newest bottle. Bring bottle, even if empty. 3. Medication refills: You are responsible for knowing and keeping track of what medications you take and those you need refilled. The day before your appointment: write a list of all prescriptions that need to be refilled. The day of the appointment: give the list to the admitting nurse. Prescriptions will be written only during appointments. No prescriptions will be written on procedure days. If you forget a medication: it will not be "Called in", "Faxed", or "electronically sent".  You will need to get another appointment to get these prescribed. No early refills. Do not call asking to have your prescription filled early. 4. Prescription Accuracy: You are responsible for carefully inspecting your prescriptions before leaving our office. Have the discharge nurse carefully go over each prescription with you, before taking them home. Make sure that your name is accurately spelled, that your address is correct. Check the name and dose of your medication to make sure it is accurate. Check the number of pills, and the written instructions to make sure they are clear and accurate. Make sure that you are given enough medication to last until your next medication refill appointment. 5. Taking Medication: Take medication as prescribed. When it comes to controlled substances, taking less pills or less frequently than prescribed is permitted and encouraged. Never take more pills than instructed. Never take medication more frequently than prescribed.  6. Inform other Doctors: Always inform, all of your healthcare providers, of all the medications you take. 7. Pain Medication from other Providers: You are not allowed to accept any additional pain medication from any other Doctor or Healthcare provider. There are two exceptions to this rule. (see below) In the event that you require additional pain medication, you are responsible for notifying us, as stated below. 8. Cough Medicine: Often these contain an opioid, such as codeine or hydrocodone. Never accept or take cough medicine containing these opioids if you are already taking an opioid* medication. The combination may cause respiratory failure and death. 9. Medication Agreement: You are responsible for carefully reading and following our Medication Agreement. This must be signed before receiving any prescriptions from our practice. Safely store a copy of your signed Agreement. Violations to the Agreement will result in no further prescriptions.  (Additional copies of our Medication Agreement are available upon request.) 10. Laws, Rules, & Regulations: All patients are expected to follow all   Federal and State Laws, Statutes, Rules, & Regulations. Ignorance of the Laws does not constitute a valid excuse.  11. Illegal drugs and Controlled Substances: The use of illegal substances (including, but not limited to marijuana and its derivatives) and/or the illegal use of any controlled substances is strictly prohibited. Violation of this rule may result in the immediate and permanent discontinuation of any and all prescriptions being written by our practice. The use of any illegal substances is prohibited. 12. Adopted CDC guidelines & recommendations: Target dosing levels will be at or below 60 MME/day. Use of benzodiazepines** is not recommended.  Exceptions: There are only two exceptions to the rule of not receiving pain medications from other Healthcare Providers. 1. Exception #1 (Emergencies): In the event of an emergency (i.e.: accident requiring emergency care), you are allowed to receive additional pain medication. However, you are responsible for: As soon as you are able, call our office [phone], at any time of the day or night, and leave a message stating your name, the date and nature of the emergency, and the name and dose of the medication prescribed. In the event that your call is answered by a member of our staff, make sure to document and save the date, time, and the name of the person that took your information.  2. Exception #2 (Planned Surgery): In the event that you are scheduled by another doctor or dentist to have any type of surgery or procedure, you are allowed (for a period no longer than 30 days), to receive additional pain medication, for the acute post-op pain. However, in this case, you are responsible for picking up a copy of our "Post-op Pain Management for Surgeons" handout, and giving it to your surgeon or dentist. This  document is available at our office, and does not require an appointment to obtain it. Simply go to our office during business hours (Monday-Thursday from 8:00 AM to 4:00 PM) (Friday 8:00 AM to 12:00 Noon) or if you have a scheduled appointment with us, prior to your surgery, and ask for it by name. In addition, you are responsible for: calling our office [phone], at any time of the day or night, and leaving a message stating your name, name of your surgeon, type of surgery, and date of procedure or surgery. Failure to comply with your responsibilities may result in termination of therapy involving the controlled substances.  *Opioid medications include: morphine, codeine, oxycodone, oxymorphone, hydrocodone, hydromorphone, meperidine, tramadol, tapentadol, buprenorphine, fentanyl, methadone. **Benzodiazepine medications include: diazepam (Valium), alprazolam (Xanax), clonazepam (Klonopine), lorazepam (Ativan), clorazepate (Tranxene), chlordiazepoxide (Librium), estazolam (Prosom), oxazepam (Serax), temazepam (Restoril), triazolam (Halcion) (Last updated: 09/25/2020) ____________________________________________________________________________________________   ____________________________________________________________________________________________  Medication Recommendations and Reminders  Applies to: All patients receiving prescriptions (written and/or electronic).  Medication Rules & Regulations: These rules and regulations exist for your safety and that of others. They are not flexible and neither are we. Dismissing or ignoring them will be considered "non-compliance" with medication therapy, resulting in complete and irreversible termination of such therapy. (See document titled "Medication Rules" for more details.) In all conscience, because of safety reasons, we cannot continue providing a therapy where the patient does not follow instructions.  Pharmacy of record:   Definition:  This is the pharmacy where your electronic prescriptions will be sent.   We do not endorse any particular pharmacy, however, we have experienced problems with [facility] not securing enough medication supply for the community.  We do not restrict you in your choice of pharmacy. However,   once we write for your prescriptions, we will NOT be re-sending more prescriptions to fix restricted supply problems created by your pharmacy, or your insurance.   The pharmacy listed in the electronic medical record should be the one where you want electronic prescriptions to be sent.  If you choose to change pharmacy, simply notify our nursing staff.  Recommendations:  Keep all of your pain medications in a safe place, under lock and key, even if you live alone. We will NOT replace lost, stolen, or damaged medication.  After you fill your prescription, take 1 week's worth of pills and put them away in a safe place. You should keep a separate, properly labeled bottle for this purpose. The remainder should be kept in the original bottle. Use this as your primary supply, until it runs out. Once it's gone, then you know that you have 1 week's worth of medicine, and it is time to come in for a prescription refill. If you do this correctly, it is unlikely that you will ever run out of medicine.  To make sure that the above recommendation works, it is very important that you make sure your medication refill appointments are scheduled at least 1 week before you run out of medicine. To do this in an effective manner, make sure that you do not leave the office without scheduling your next medication management appointment. Always ask the nursing staff to show you in your prescription , when your medication will be running out. Then arrange for the receptionist to get you a return appointment, at least 7 days before you run out of medicine. Do not wait until you have 1 or 2 pills left, to come in. This is very poor planning and  does not take into consideration that we may need to cancel appointments due to bad weather, sickness, or emergencies affecting our staff.  DO NOT ACCEPT A "Partial Fill": If for any reason your pharmacy does not have enough pills/tablets to completely fill or refill your prescription, do not allow for a "partial fill". The law allows the pharmacy to complete that prescription within 72 hours, without requiring a new prescription. If they do not fill the rest of your prescription within those 72 hours, you will need a separate prescription to fill the remaining amount, which we will NOT provide. If the reason for the partial fill is your insurance, you will need to talk to the pharmacist about payment alternatives for the remaining tablets, but again, DO NOT ACCEPT A PARTIAL FILL, unless you can trust your pharmacist to obtain the remainder of the pills within 72 hours.  Prescription refills and/or changes in medication(s):   Prescription refills, and/or changes in dose or medication, will be conducted only during scheduled medication management appointments. (Applies to both, written and electronic prescriptions.)  No refills on procedure days. No medication will be changed or started on procedure days. No changes, adjustments, and/or refills will be conducted on a procedure day. Doing so will interfere with the diagnostic portion of the procedure.  No phone refills. No medications will be "called into the pharmacy".  No Fax refills.  No weekend refills.  No Holliday refills.  No after hours refills.  Remember:  Business hours are:  Monday to Thursday 8:00 AM to 4:00 PM Provider's Schedule: Haeven Nickle, MD - Appointments are:  Medication management: Monday and Wednesday 8:00 AM to 4:00 PM Procedure day: Tuesday and Thursday 7:30 AM to 4:00 PM Bilal Lateef, MD - Appointments are:    Medication management: Tuesday and Thursday 8:00 AM to 4:00 PM Procedure day: Monday and Wednesday  7:30 AM to 4:00 PM (Last update: 05/17/2020) ____________________________________________________________________________________________   ____________________________________________________________________________________________  CBD (cannabidiol) WARNING  Applicable to: All individuals currently taking or considering taking CBD (cannabidiol) and, more important, all patients taking opioid analgesic controlled substances (pain medication). (Example: oxycodone; oxymorphone; hydrocodone; hydromorphone; morphine; methadone; tramadol; tapentadol; fentanyl; buprenorphine; butorphanol; dextromethorphan; meperidine; codeine; etc.)  Legal status: CBD remains a Schedule I drug prohibited for any use. CBD is illegal with one exception. In the [place], CBD has a limited Food and Drug Administration (FDA) approval for the treatment of two specific types of epilepsy disorders. Only one CBD product has been approved by the FDA for this purpose: "Epidiolex". FDA is aware that some companies are marketing products containing cannabis and cannabis-derived compounds in ways that violate the Federal Food, Drug and Cosmetic Act (FD&C Act) and that may put the health and safety of consumers at risk. The FDA, a Federal agency, has not enforced the CBD status since 2018.   Legality: Some manufacturers ship CBD products nationally, which is illegal. Often such products are sold online and are therefore available throughout the country. CBD is openly sold in head shops and health food stores in some states where such sales have not been explicitly legalized. Selling unapproved products with unsubstantiated therapeutic claims is not only a violation of the law, but also can put patients at risk, as these products have not been proven to be safe or effective. Federal illegality makes it difficult to conduct research on CBD.  Reference: "FDA Regulation of Cannabis and Cannabis-Derived Products, Including Cannabidiol  (CBD)" - https://www.fda.gov/news-events/public-health-focus/fda-regulation-cannabis-and-cannabis-derived-products-including-cannabidiol-cbd  Warning: CBD is not FDA approved and has not undergo the same manufacturing controls as prescription drugs.  This means that the purity and safety of available CBD may be questionable. Most of the time, despite manufacturer's claims, it is contaminated with THC (delta-9-tetrahydrocannabinol - the chemical in marijuana responsible for the "HIGH").  When this is the case, the THC contaminant will trigger a positive urine drug screen (UDS) test for Marijuana (carboxy-THC). Because a positive UDS for any illicit substance is a violation of our medication agreement, your opioid analgesics (pain medicine) may be permanently discontinued.  MORE ABOUT CBD  General Information: CBD  is a derivative of the Marijuana (cannabis sativa) plant discovered in 1940. It is one of the 113 identified substances found in Marijuana. It accounts for up to 40% of the plant's extract. As of 2018, preliminary clinical studies on CBD included research for the treatment of anxiety, movement disorders, and pain. CBD is available and consumed in multiple forms, including inhalation of smoke or vapor, as an aerosol spray, and by mouth. It may be supplied as an oil containing CBD, capsules, dried cannabis, or as a liquid solution. CBD is thought not to be as psychoactive as THC (delta-9-tetrahydrocannabinol - the chemical in marijuana responsible for the "HIGH"). Studies suggest that CBD may interact with different biological target receptors in the body, including cannabinoid and other neurotransmitter receptors. As of 2018 the mechanism of action for its biological effects has not been determined.  Side-effects  Adverse reactions: Dry mouth, diarrhea, decreased appetite, fatigue, drowsiness, malaise, weakness, sleep disturbances, and others.  Drug interactions: CBC may interact with other  medications such as blood-thinners. (Last update: 06/03/2020) ____________________________________________________________________________________________    

## 2020-12-26 ENCOUNTER — Encounter: Payer: Medicare PPO | Admitting: Pain Medicine

## 2021-01-14 NOTE — Progress Notes (Signed)
PROVIDER NOTE: Information contained herein reflects review and annotations entered in association with encounter. Interpretation of such information and data should be left to medically-trained personnel. Information provided to patient can be located elsewhere in the medical record under "Patient Instructions". Document created using STT-dictation technology, any transcriptional errors that may result from process are unintentional.    Patient: Karen Edwards  Service Category: E/M  Provider: Gaspar Cola, MD  DOB: 10-03-1966  DOS: 01/15/2021  Specialty: Interventional Pain Management  MRN: [id number]  Setting: Ambulatory outpatient  PCP: Pleas Koch, NP  Type: Established Patient    Referring Provider: Pleas Koch, NP  Location: Office  Delivery: Face-to-face     HPI  [name], a 55 y.o. year old female, is here today because of her Chronic pain syndrome [G89.4]. [name] primary complain today is Back Pain (Lower, more on the right) Last encounter: My last encounter with her was on 11/14/2020. Pertinent problems: [name] has Chronic low back pain (1ry area of Pain) (Bilateral) (R>L) w/ sciatica (Bilateral); DDD (degenerative disc disease), lumbar; DDD (degenerative disc disease), cervical; Chronic occipital neuralgia (Fifth Area of Pain) (Bilateral) (R>L); Lumbar facet syndrome (Bilateral) (L>R); DDD (degenerative disc disease), lumbosacral; Lumbar radicular pain (Right) (L4); Sacroiliac joint dysfunction (Bilateral); Chronic neck pain (Fourth Area of Pain) (Bilateral) (R>L); Numbness and tingling of upper extremity (Right); Chronic upper extremity pain (Bilateral) (R>L); Chronic sacroiliac joint pain (Bilateral) (L>R); Lumbar facet hypertrophy (Bilateral); L1-2 disc extrusion (Right); Cervical foraminal stenosis (C5-6) (Bilateral); Chronic pain syndrome; Chronic cervical radicular pain (Right: C6/C7) (Left: C5/T1); Chronic lumbar radicular pain; Chronic hip  pain (3ry area of Pain) (Bilateral) (R>L); Chronic lower extremity pain (2ry area of Pain) (Bilateral) (R>L); Chronic shoulder pain (Bilateral) (L>R); Chronic musculoskeletal pain; Lumbar spondylosis; Cervical facet syndrome (Bilateral) (R>L); Myofascial pain; Spondylosis without myelopathy or radiculopathy, cervical region; Cervicalgia; S/P insertion of Epidural Neurostimulator (SCS) (Bilateral); Chronic neck pain with history of cervical spinal surgery; 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 Pain Assessment: Severity of Chronic pain is reported as a 7 /10. Location: Back Lower/bilateral but worse on the right, bilatearl legs to knee.. Onset: More than a month ago. Quality: Stabbing,Aching,Burning,Constant,Sharp,Shooting,Radiating,Numbness,Tightness. Timing: Constant. Modifying factor(s): medications, rest. Vitals:  height is 5' 1"  (1.549 m) and weight is 140 lb (63.5 kg). Her temporal temperature is 97.3 F (36.3 C) (abnormal). Her blood pressure is 120/77 and her pulse is 75. Her respiration is 16 and oxygen saturation is 100%.   Reason for encounter: medication management.   The patient indicates doing well with the current medication regimen. No adverse reactions or side effects reported to the medications.  In addition to this, the patient indicates having some recurrence of the right-sided low back pain.  Reviewing the chart  I have noticed that her last right lumbar facet radiofrequency ablation was done around March 2021 and therefore he has been 12 months since that treatment.  At this point, it would appear that it is wearing off and therefore he will need to be repeated since it did provide her with almost  1 year of excellent benefit.  This plan was shared with the patient who understood and agreed.  Statement of Medical Necessity:  [name] to experienced debilitating chronic nerve-associated pain from the Lumbosacral Facet Syndrome (Spondylosis without myelopathy or radiculopathy, lumbosacral region [M47.817]).  Duration: This pain has persisted for longer than three months.  Non-surgical care: The patient has either failed to respond, or was unable to tolerate, or simply did not get enough benefit from other more conservative therapies including, but not limited to: 1. Over-the-counter oral analgesic medications (i.e.: ibuprofen, naproxen, etc.) 2. Anti-inflammatory medications 3. Muscle relaxants 4. Membrane stabilizers 5. Opioids 6. Physical therapy (PT), chiropractic manipulation, and/or home exercise program (HEP). 7. Modalities (Heat, ice, etc.)  Invasive therapies: Prior radiofrequency procedures have proven to be effective in providing long-term (>6 months) benefit.  Surgical care: Not indicated.  Physical exam: Has been consistent with Lumbosacral Facet Syndrome.  Diagnostic imaging: Lumbosacral Facet Arthropathy.                Diagnostic interventional therapies: [name] has attained greater than 50% reduction in pain from at least two (2) diagnostic medial branch blocks conducted in separate occasions.   For the above listed reason, I believe, as the examining and treating physician, that it is medically necessary to proceed with Non-Pulsed Radiofrequency Ablation for the purpose of attempting to prolong the duration of the benefits seen with the diagnostic  injections.  RTCB: 04/15/2021 Nonopioids transfer 08/23/2020: Lidocaine 5% ointment and Robaxin  Pharmacotherapy Assessment   Analgesic: Oxycodone IR 5 mg, 1 tab PO BID (10 mg/day of oxycodone) MME/day:15mg /day.   Monitoring: [place] PMP: PDMP reviewed during this encounter.       Pharmacotherapy: No side-effects or adverse reactions reported. Compliance: No problems identified. Effectiveness: Clinically acceptable.  Hart Rochester, RN  01/15/2021  9:26 AM  Sign when Signing Visit Nursing Pain Medication Assessment:  Safety precautions to be maintained throughout the outpatient stay will include: orient to surroundings, keep bed in low position, maintain call bell within reach at all times, provide assistance with transfer out of bed and ambulation.  Medication Inspection Compliance: Ms. Branan did not comply with our request to bring her pills to be counted. She was reminded that bringing the medication bottles, even when empty, is a requirement.  Medication: None brought in. Pill/Patch Count: None available to be counted. Bottle Appearance: No container available. Did not bring bottle(s) to appointment. Filled Date: N/A Last Medication intake:  Ran out of medicine more than 48 hours ago    UDS:  Summary  Date Value Ref Range Status  04/06/2018 FINAL  Final    Comment:    ==================================================================== TOXASSURE SELECT 13 (MW) ==================================================================== Test                             Result       Flag       Units Drug Present and Declared for Prescription Verification   Norhydrocodone                 228          EXPECTED   ng/mg creat    Norhydrocodone is an expected metabolite of hydrocodone. Drug Absent but Declared for Prescription Verification   Hydrocodone                    Not Detected UNEXPECTED ng/mg creat    Hydrocodone is almost always  present in patients taking this drug     consistently. Absence of hydrocodone could be due to lapse of    time since the last dose or unusual pharmacokinetics (rapid    metabolism). ==================================================================== Test                      Result    Flag   Units      Ref Range   Creatinine              60               mg/dL      >=20 ==================================================================== Declared Medications:  The flagging and interpretation on this report are based on the  following declared medications.  Unexpected results may arise from  inaccuracies in the declared medications.  **Note: The testing scope of this panel includes these medications:  Hydrocodone (Norco)  **Note: The testing scope of this panel does not include following  reported medications:  Acetaminophen (Norco)  Atorvastatin (Lipitor)  Cyanocobalamin  Fluoxetine (Prozac)  Methocarbamol (Robaxin)  Potassium  Supplement  Trazodone (Desyrel) ==================================================================== For clinical consultation, please call [phone]. ====================================================================      ROS  Constitutional: Denies any fever or chills Gastrointestinal: No reported hemesis, hematochezia, vomiting, or acute GI distress Musculoskeletal: Denies any acute onset joint swelling, redness, loss of ROM, or weakness Neurological: No reported episodes of acute onset apraxia, aphasia, dysarthria, agnosia, amnesia, paralysis, loss of coordination, or loss of consciousness  Medication Review  Biotin, FLUoxetine, Fish Oil, Turmeric, acetaminophen, lidocaine, methocarbamol, naloxegol oxalate, ondansetron, oxyCODONE, scopolamine, and traZODone  History Review  Allergy: Ms. Tennant has No Known Allergies. Drug: Ms. Friis  reports no history of drug use. Alcohol:  reports current alcohol use of about 1.0 standard drink of alcohol per week. Tobacco:  reports that  she has been smoking cigarettes. She has a 18.50 pack-year smoking history. She has never used smokeless tobacco. Social: Ms. Trostle  reports that she has been smoking cigarettes. She has a 18.50 pack-year smoking history. She has never used smokeless tobacco. She reports current alcohol use of about 1.0 standard drink of alcohol per week. She reports that she does not use drugs. Medical:  has a past medical history of Anxiety, Chronic back pain, Degenerative joint disease, Depression, Headache, Hypotension, IBS (irritable bowel syndrome) (05/2015), and MVP (mitral valve prolapse). Surgical: Ms. Consoli  has a past surgical history that includes Cesarean section; Ankle surgery (Right, 03/02/2014); Hemorrhoid surgery (N/A, 06/16/2015); Cervical spine surgery (02/21/2016); Cesarean section with bilateral tubal ligation (1990); Colonoscopy (04/19/2015); Hysteroscopy with D & C (12/24/2013); Colonoscopy with propofol (N/A, 05/18/2018); Esophagogastroduodenoscopy (egd) with propofol (N/A, 05/18/2018); Spinal cord stimulator insertion (N/A, 06/18/2018); Tubal ligation; and Lumbar spinal cord simulator revision (N/A, 03/14/2020). Family: family history includes Arthritis in her mother; Congestive Heart Failure in her maternal grandfather; Depression in her mother; Diabetes in her maternal aunt; Diabetes Mellitus I in her maternal grandmother; Gout in her maternal aunt; Hearing loss in her father; Heart attack in her maternal grandfather; Hyperlipidemia in her father and mother; Hypertension in her father and mother; Hypothyroidism in her mother; Kidney disease in her mother; Prostate cancer in her paternal grandfather; Stroke in her maternal grandmother; Thyroid disease in her maternal aunt; Vision loss in her mother.  Laboratory Chemistry Profile   Renal Lab Results  Component Value Date   BUN 7 09/08/2017   CREATININE 0.84 09/08/2017   BCR 8 (L) 09/08/2017   GFR 76.93  05/20/2017   GFRAA 93 09/08/2017    GFRNONAA 81 09/08/2017     Hepatic Lab Results  Component Value Date   AST 18 09/08/2017   ALT 12 (L) 01/30/2017   ALBUMIN 4.7 09/08/2017   ALKPHOS 73 09/08/2017     Electrolytes Lab Results  Component Value Date   NA 139 09/08/2017   K 4.4 09/08/2017   CL 99 09/08/2017   CALCIUM 9.8 09/08/2017   MG 2.1 09/08/2017     Bone Lab Results  Component Value Date   VD25OH 43.74 01/10/2016   25OHVITD1 51 09/08/2017   25OHVITD2 <1.0 09/08/2017   25OHVITD3 51 09/08/2017     Inflammation (CRP: Acute Phase) (ESR: Chronic Phase) Lab Results  Component Value Date   CRP 1.4 09/08/2017   ESRSEDRATE 11 09/08/2017       Note: Above Lab results reviewed.  Recent Imaging Review  DG PAIN [facility] C-ARM 1-60 MIN NO REPORT Fluoro was used, but no Radiologist interpretation will be provided.  Please refer to "NOTES" tab for provider progress note. Note: Reviewed        Physical Exam  General appearance: Well nourished, well developed, and well hydrated. In no apparent acute distress Mental status: Alert, oriented x 3 (person, place, & time)       Respiratory: No evidence of acute respiratory distress Eyes: PERLA Vitals: BP 120/77 (BP Location: Left Arm, Patient Position: Sitting, Cuff Size: Normal)   Pulse 75   Temp (!) 97.3 F (36.3 C) (Temporal)   Resp 16   Ht 5' 1"  (1.549 m)   Wt 140 lb (63.5 kg)   LMP 05/05/2017 (Exact Date)   SpO2 100%   BMI 26.45 kg/m  BMI: Estimated body mass index is 26.45 kg/m as calculated from the following:   Height as of this encounter: 5' 1"  (1.549 m).   Weight as of this encounter: 140 lb (63.5 kg). Ideal: Ideal body weight: 47.8 kg (105 lb 6.1 oz) Adjusted ideal body weight: 54.1 kg (119 lb 3.7 oz)  Assessment   Status Diagnosis  Controlled Worsening Worsening 1. Chronic pain syndrome   2. Chronic low back pain (Bilateral) w/o sciatica   3. Lumbar facet syndrome (Bilateral) (L>R)   4. Lumbar facet hypertrophy (Bilateral)   5. Presence  of neurostimulator (Thoracolumbar)   6. Pharmacologic therapy   7. Uncomplicated opioid dependence ([place])   8. Long term prescription opiate use   9. DDD (degenerative disc disease), lumbosacral      Updated Problems: Problem  Uncomplicated Opioid Dependence ([facility])  Chronic Constipation   Overview:  Last Assessment & Plan:  Managed on Linzess per GI for constipation. Has been taking irregularly due to lack of affordability.  She is now back on meds and encouraged her to call GI if no improvement.   Formatting of this note might be different from the original. Last Assessment & Plan:  Managed on Linzess per GI for constipation. Has been taking irregularly due to lack of affordability.  She is now back on meds and encouraged her to call GI if no improvement.     Plan of Care  Problem-specific:  No problem-specific Assessment & Plan notes found for this encounter.  Ms. TITIANNA LOOMIS has a current medication list which includes the following long-term medication(s): fluoxetine, methocarbamol, naloxegol oxalate, trazodone, lidocaine, oxycodone, [START ON 02/14/2021] oxycodone, and [START ON 03/16/2021] oxycodone.  Pharmacotherapy (Medications Ordered): Meds ordered this encounter  Medications  . oxyCODONE (OXY IR/ROXICODONE) 5 MG immediate release  tablet    Sig: Take 1 tablet (5 mg total) by mouth 2 (two) times daily as needed for severe pain. Must last 30 days.    Dispense:  40 tablet    Refill:  0    Not a duplicate. Do NOT delete! Chronic Pain: STOP Act NOT applicable. Fill 1 day early if closed on refill date. Avoid benzodiazepines within 8 hours of opioids. Do not send refill requests.  Marland Kitchen oxyCODONE (OXY IR/ROXICODONE) 5 MG immediate release tablet    Sig: Take 1 tablet (5 mg total) by mouth 2 (two) times daily as needed for severe pain. Must last 30 days.    Dispense:  40 tablet    Refill:  0    Not a duplicate. Do NOT delete! Chronic Pain: STOP Act NOT applicable. Fill 1  day early if closed on refill date. Avoid benzodiazepines within 8 hours of opioids. Do not send refill requests.  Marland Kitchen oxyCODONE (OXY IR/ROXICODONE) 5 MG immediate release tablet    Sig: Take 1 tablet (5 mg total) by mouth 2 (two) times daily as needed for severe pain. Must last 30 days.    Dispense:  40 tablet    Refill:  0    Not a duplicate. Do NOT delete! Chronic Pain: STOP Act NOT applicable. Fill 1 day early if closed on refill date. Avoid benzodiazepines within 8 hours of opioids. Do not send refill requests.   Orders:  Orders Placed This Encounter  Procedures  . Radiofrequency,Lumbar    Standing Status:   Future    Standing Expiration Date:   01/15/2022    Scheduling Instructions:     Side(s): Right-sided     Level: L3-4, L4-5, & L5-S1 Facets (L2, L3, L4, L5, & S1 Medial Branch Nerves)     Sedation: With Sedation.     Scheduling Timeframe: As soon as pre-approved    Order Specific Question:   Where will this procedure be performed?    Answer:   [facility]  . ToxASSURE Select 13 (MW), Urine    Volume: 30 ml(s). Minimum 3 ml of urine is needed. Document temperature of fresh sample. Indications: Long term (current) use of opiate analgesic (A19.379)    Order Specific Question:   Release to patient    Answer:   Immediate   Follow-up plan:   Return for RFA (76mn): (R) L-FCT RFA #2.     Interventional Therapies  Risk  Complexity Considerations:   WNL   Planned  Pending:   Therapeutic right lumbar facet RFA #2    Under consideration:   Possible bilateral SI joint RFA #1 Diagnostic bilateral IA hip joint injection Diagnostic bilateral femoral + obturator NB Diagnostic right L1-2LESI Diagnostic bilateralL4TFESI Diagnostic bilateral IA shoulder joint injection Diagnostic bilateral suprascapular NB   Completed:   Therapeutic right cervical ESI x1  Diagnostic bilateral GONB x1(08/19/2019) Palliative bilateral cervical facet blocks x4 Palliative right  cervical facet RFA x1 (09/17/2018) Palliative left cervical facet RFA x1 (05/04/2019) Palliative right L2-3 interlaminar LESI x2  Palliative right L1 TFESI x1 Bilateral lumbar spinal cord stimulator trial (done) (05/14/2018) Diagnostic bilateral lumbar facet block x3  Diagnostic bilateral SI joint block x2  Palliative left lumbar facet RFA x1 (12/28/2019)  Palliative right lumbar facet RFA x1 (01/11/2020)   Therapeutic  Palliative (PRN) options:   Therapeutic right cervical ESI #2  Diagnostic bilateral GONB #2 Palliative bilateral cervical facet blocks #5 Palliative right cervical facet RFA #2  Palliative left cervical facet RFA #2  Palliative right L2-3 interlaminar LESI #3  Palliative right L1 TFESI #2 Diagnostic bilateral lumbar facet block #4  Diagnostic bilateral SI joint block #3  Palliative left lumbar facet RFA #2  Palliative right lumbar facet RFA #2     Recent Visits Date Type Provider Dept  11/21/20 Telemedicine Milinda Pointer, MD [facility]  Showing recent visits within past 90 days and meeting all other requirements Today's Visits Date Type Provider Dept  01/15/21 Office Visit Milinda Pointer, MD [facility]  Showing today's visits and meeting all other requirements Future Appointments Date Type Provider Dept  04/09/21 Appointment Milinda Pointer, MD [facility]  Showing future appointments within next 90 days and meeting all other requirements  I discussed the assessment and treatment plan with the patient. The patient was provided an opportunity to ask questions and all were answered. The patient agreed with the plan and demonstrated an understanding of the instructions.  Patient advised to call back or seek an in-person evaluation if the symptoms or condition worsens.  Duration of encounter: 30 minutes.  Note by: Gaspar Cola, MD Date: 01/15/2021; Time: 10:01 AM

## 2021-01-15 ENCOUNTER — Encounter: Payer: Self-pay | Admitting: Pain Medicine

## 2021-01-15 ENCOUNTER — Other Ambulatory Visit: Payer: Self-pay

## 2021-01-15 ENCOUNTER — Ambulatory Visit: Payer: Medicare PPO | Attending: Pain Medicine | Admitting: Pain Medicine

## 2021-01-15 VITALS — BP 120/77 | HR 75 | Temp 97.3°F | Resp 16 | Ht 61.0 in | Wt 140.0 lb

## 2021-01-15 DIAGNOSIS — G8929 Other chronic pain: Secondary | ICD-10-CM | POA: Insufficient documentation

## 2021-01-15 DIAGNOSIS — G894 Chronic pain syndrome: Secondary | ICD-10-CM | POA: Insufficient documentation

## 2021-01-15 DIAGNOSIS — M5137 Other intervertebral disc degeneration, lumbosacral region: Secondary | ICD-10-CM | POA: Insufficient documentation

## 2021-01-15 DIAGNOSIS — M545 Low back pain, unspecified: Secondary | ICD-10-CM | POA: Diagnosis not present

## 2021-01-15 DIAGNOSIS — Z9682 Presence of neurostimulator: Secondary | ICD-10-CM | POA: Diagnosis not present

## 2021-01-15 DIAGNOSIS — Z79891 Long term (current) use of opiate analgesic: Secondary | ICD-10-CM | POA: Insufficient documentation

## 2021-01-15 DIAGNOSIS — F1120 Opioid dependence, uncomplicated: Secondary | ICD-10-CM | POA: Diagnosis not present

## 2021-01-15 DIAGNOSIS — M47816 Spondylosis without myelopathy or radiculopathy, lumbar region: Secondary | ICD-10-CM | POA: Insufficient documentation

## 2021-01-15 DIAGNOSIS — Z79899 Other long term (current) drug therapy: Secondary | ICD-10-CM | POA: Diagnosis not present

## 2021-01-15 MED ORDER — OXYCODONE HCL 5 MG PO TABS: 5.0000 mg | ORAL_TABLET | Freq: Two times a day (BID) | ORAL | 0 refills | Status: DC | PRN

## 2021-01-15 NOTE — Patient Instructions (Signed)
____________________________________________________________________________________________  Preparing for Procedure with Sedation  Procedure appointments are limited to planned procedures: . No Prescription Refills. . No disability issues will be discussed. . No medication changes will be discussed.  Instructions: . Oral Intake: Do not eat or drink anything for at least 8 hours prior to your procedure. (Exception: Blood Pressure Medication. See below.) . Transportation: Unless otherwise stated by your physician, you may drive yourself after the procedure. . Blood Pressure Medicine: Do not forget to take your blood pressure medicine with a sip of water the morning of the procedure. If your Diastolic (lower reading)is above 100 mmHg, elective cases will be cancelled/rescheduled. . Blood thinners: These will need to be stopped for procedures. Notify our staff if you are taking any blood thinners. Depending on which one you take, there will be specific instructions on how and when to stop it. . Diabetics on insulin: Notify the staff so that you can be scheduled 1st case in the morning. If your diabetes requires high dose insulin, take only  of your normal insulin dose the morning of the procedure and notify the staff that you have done so. . Preventing infections: Shower with an antibacterial soap the morning of your procedure. . Build-up your immune system: Take 1000 mg of Vitamin C with every meal (3 times a day) the day prior to your procedure. . Antibiotics: Inform the staff if you have a condition or reason that requires you to take antibiotics before dental procedures. . Pregnancy: If you are pregnant, call and cancel the procedure. . Sickness: If you have a cold, fever, or any active infections, call and cancel the procedure. . Arrival: You must be in the facility at least 30 minutes prior to your scheduled procedure. . Children: Do not bring children with you. . Dress appropriately:  Bring dark clothing that you would not mind if they get stained. . Valuables: Do not bring any jewelry or valuables.  Reasons to call and reschedule or cancel your procedure: (Following these recommendations will minimize the risk of a serious complication.) . Surgeries: Avoid having procedures within 2 weeks of any surgery. (Avoid for 2 weeks before or after any surgery). . Flu Shots: Avoid having procedures within 2 weeks of a flu shots or . (Avoid for 2 weeks before or after immunizations). . Barium: Avoid having a procedure within 7-10 days after having had a radiological study involving the use of radiological contrast. (Myelograms, Barium swallow or enema study). . Heart attacks: Avoid any elective procedures or surgeries for the initial 6 months after a "Myocardial Infarction" (Heart Attack). . Blood thinners: It is imperative that you stop these medications before procedures. Let us know if you if you take any blood thinner.  . Infection: Avoid procedures during or within two weeks of an infection (including chest colds or gastrointestinal problems). Symptoms associated with infections include: Localized redness, fever, chills, night sweats or profuse sweating, burning sensation when voiding, cough, congestion, stuffiness, runny nose, sore throat, diarrhea, nausea, vomiting, cold or Flu symptoms, recent or current infections. It is specially important if the infection is over the area that we intend to treat. . Heart and lung problems: Symptoms that may suggest an active cardiopulmonary problem include: cough, chest pain, breathing difficulties or shortness of breath, dizziness, ankle swelling, uncontrolled high or unusually low blood pressure, and/or palpitations. If you are experiencing any of these symptoms, cancel your procedure and contact your primary care physician for an evaluation.  Remember:  Regular Business hours are:    Monday to Thursday 8:00 AM to 4:00 PM  Provider's  Schedule: Charlet Harr, MD:  Procedure days: Tuesday and Thursday 7:30 AM to 4:00 PM  Bilal Lateef, MD:  Procedure days: Monday and Wednesday 7:30 AM to 4:00 PM ____________________________________________________________________________________________   ____________________________________________________________________________________________  General Risks and Possible Complications  Patient Responsibilities: It is important that you read this as it is part of your informed consent. It is our duty to inform you of the risks and possible complications associated with treatments offered to you. It is your responsibility as a patient to read this and to ask questions about anything that is not clear or that you believe was not covered in this document.  Patient's Rights: You have the right to refuse treatment. You also have the right to change your mind, even after initially having agreed to have the treatment done. However, under this last option, if you wait until the last second to change your mind, you may be charged for the materials used up to that point.  Introduction: Medicine is not an exact science. Everything in Medicine, including the lack of treatment(s), carries the potential for danger, harm, or loss (which is by definition: Risk). In Medicine, a complication is a secondary problem, condition, or disease that can aggravate an already existing one. All treatments carry the risk of possible complications. The fact that a side effects or complications occurs, does not imply that the treatment was conducted incorrectly. It must be clearly understood that these can happen even when everything is done following the highest safety standards.  No treatment: You can choose not to proceed with the proposed treatment alternative. The "PRO(s)" would include: avoiding the risk of complications associated with the therapy. The "CON(s)" would include: not getting any of the treatment  benefits. These benefits fall under one of three categories: diagnostic; therapeutic; and/or palliative. Diagnostic benefits include: getting information which can ultimately lead to improvement of the disease or symptom(s). Therapeutic benefits are those associated with the successful treatment of the disease. Finally, palliative benefits are those related to the decrease of the primary symptoms, without necessarily curing the condition (example: decreasing the pain from a flare-up of a chronic condition, such as incurable terminal cancer).  General Risks and Complications: These are associated to most interventional treatments. They can occur alone, or in combination. They fall under one of the following six (6) categories: no benefit or worsening of symptoms; bleeding; infection; nerve damage; allergic reactions; and/or death. 1. No benefits or worsening of symptoms: In Medicine there are no guarantees, only probabilities. No healthcare provider can ever guarantee that a medical treatment will work, they can only state the probability that it may. Furthermore, there is always the possibility that the condition may worsen, either directly, or indirectly, as a consequence of the treatment. 2. Bleeding: This is more common if the patient is taking a blood thinner, either prescription or over the counter (example: Goody Powders, Fish oil, Aspirin, Garlic, etc.), or if suffering a condition associated with impaired coagulation (example: Hemophilia, cirrhosis of the liver, low platelet counts, etc.). However, even if you do not have one on these, it can still happen. If you have any of these conditions, or take one of these drugs, make sure to notify your treating physician. 3. Infection: This is more common in patients with a compromised immune system, either due to disease (example: diabetes, cancer, human immunodeficiency virus [HIV], etc.), or due to medications or treatments (example: therapies used to treat  cancer and   rheumatological diseases). However, even if you do not have one on these, it can still happen. If you have any of these conditions, or take one of these drugs, make sure to notify your treating physician. 4. Nerve Damage: This is more common when the treatment is an invasive one, but it can also happen with the use of medications, such as those used in the treatment of cancer. The damage can occur to small secondary nerves, or to large primary ones, such as those in the spinal cord and brain. This damage may be temporary or permanent and it may lead to impairments that can range from temporary numbness to permanent paralysis and/or brain death. 5. Allergic Reactions: Any time a substance or material comes in contact with our body, there is the possibility of an allergic reaction. These can range from a mild skin rash (contact dermatitis) to a severe systemic reaction (anaphylactic reaction), which can result in death. 6. Death: In general, any medical intervention can result in death, most of the time due to an unforeseen complication. ____________________________________________________________________________________________   

## 2021-01-15 NOTE — Progress Notes (Signed)
Nursing Pain Medication Assessment:  Safety precautions to be maintained throughout the outpatient stay will include: orient to surroundings, keep bed in low position, maintain call bell within reach at all times, provide assistance with transfer out of bed and ambulation.  Medication Inspection Compliance: Ms. Putz did not comply with our request to bring her pills to be counted. She was reminded that bringing the medication bottles, even when empty, is a requirement.  Medication: None brought in. Pill/Patch Count: None available to be counted. Bottle Appearance: No container available. Did not bring bottle(s) to appointment. Filled Date: N/A Last Medication intake:  Ran out of medicine more than 48 hours ago

## 2021-01-17 ENCOUNTER — Encounter: Payer: Medicare PPO | Admitting: Pain Medicine

## 2021-01-17 ENCOUNTER — Telehealth: Payer: Self-pay | Admitting: Primary Care

## 2021-01-17 NOTE — Telephone Encounter (Signed)
Pt called in due to her pain management doctor (Dr. Dossie Arbour) use to give her muscle relaxers for her back but he told her that her PCP should be giving it to her now.  Please advise

## 2021-01-18 NOTE — Telephone Encounter (Signed)
Please kindly notify patient:  I haven't seen patient in person in nearly 2 years. We did a video visit in November for which I requested at the time she come by for labs, she has yet to do so.  I will refill her methocarbamol, but not until she comes for labs as requested four months ago. Once her labs are drawn I will refill.   Also, I will need to see her in the office this year for CPE, we can do this now or in November, but she really needs to come in.  Please notify me once labs have been collected.

## 2021-01-18 NOTE — Telephone Encounter (Signed)
Called patient she was told by pain management that she needs to get muscle relaxer from PCP. She has been on Methocarbamol 500mg  in the past. Per patient was given PRN. She had been taking 2 tabs bid until she ran out last week.

## 2021-01-19 NOTE — Telephone Encounter (Signed)
Left message to return call to our office.  

## 2021-01-23 LAB — TOXASSURE SELECT 13 (MW), URINE

## 2021-01-23 NOTE — Telephone Encounter (Signed)
Noted.  Will provide refill of methocarbamol once her labs have been drawn.

## 2021-01-23 NOTE — Telephone Encounter (Signed)
Patient called back has made lab for next week and CPE in may

## 2021-02-01 ENCOUNTER — Ambulatory Visit (HOSPITAL_BASED_OUTPATIENT_CLINIC_OR_DEPARTMENT_OTHER): Payer: Medicare PPO | Admitting: Pain Medicine

## 2021-02-01 ENCOUNTER — Ambulatory Visit
Admission: RE | Admit: 2021-02-01 | Discharge: 2021-02-01 | Disposition: A | Discharge status: Home / Self Care | Payer: Medicare PPO | Source: Ambulatory Visit | Attending: Pain Medicine | Admitting: Pain Medicine

## 2021-02-01 ENCOUNTER — Encounter: Payer: Self-pay | Admitting: Pain Medicine

## 2021-02-01 ENCOUNTER — Other Ambulatory Visit (INDEPENDENT_AMBULATORY_CARE_PROVIDER_SITE_OTHER): Payer: Medicare PPO

## 2021-02-01 ENCOUNTER — Other Ambulatory Visit: Payer: Self-pay

## 2021-02-01 VITALS — BP 121/57 | HR 61 | Temp 97.6°F | Resp 15 | Ht 61.0 in | Wt 140.0 lb

## 2021-02-01 DIAGNOSIS — Z9682 Presence of neurostimulator: Secondary | ICD-10-CM | POA: Insufficient documentation

## 2021-02-01 DIAGNOSIS — M47817 Spondylosis without myelopathy or radiculopathy, lumbosacral region: Secondary | ICD-10-CM | POA: Diagnosis not present

## 2021-02-01 DIAGNOSIS — G8929 Other chronic pain: Secondary | ICD-10-CM

## 2021-02-01 DIAGNOSIS — M545 Low back pain, unspecified: Secondary | ICD-10-CM | POA: Diagnosis not present

## 2021-02-01 DIAGNOSIS — G8918 Other acute postprocedural pain: Secondary | ICD-10-CM | POA: Diagnosis not present

## 2021-02-01 DIAGNOSIS — M25551 Pain in right hip: Secondary | ICD-10-CM | POA: Insufficient documentation

## 2021-02-01 DIAGNOSIS — M533 Sacrococcygeal disorders, not elsewhere classified: Secondary | ICD-10-CM | POA: Diagnosis not present

## 2021-02-01 DIAGNOSIS — E785 Hyperlipidemia, unspecified: Secondary | ICD-10-CM | POA: Diagnosis not present

## 2021-02-01 DIAGNOSIS — M25552 Pain in left hip: Secondary | ICD-10-CM | POA: Diagnosis not present

## 2021-02-01 DIAGNOSIS — M5136 Other intervertebral disc degeneration, lumbar region: Secondary | ICD-10-CM

## 2021-02-01 DIAGNOSIS — M47816 Spondylosis without myelopathy or radiculopathy, lumbar region: Secondary | ICD-10-CM | POA: Insufficient documentation

## 2021-02-01 LAB — CBC
HCT: 38.2 % (ref 36.0–46.0)
Hemoglobin: 12.8 g/dL (ref 12.0–15.0)
MCHC: 33.6 g/dL (ref 30.0–36.0)
MCV: 91.2 fl (ref 78.0–100.0)
Platelets: 230 10*3/uL (ref 150.0–400.0)
RBC: 4.19 Mil/uL (ref 3.87–5.11)
RDW: 14.1 % (ref 11.5–15.5)
WBC: 10.5 10*3/uL (ref 4.0–10.5)

## 2021-02-01 LAB — LIPID PANEL
Cholesterol: 202 mg/dL — ABNORMAL HIGH (ref 0–200)
HDL: 39 mg/dL — ABNORMAL LOW (ref >39.00)
LDL Cholesterol: 132 mg/dL — ABNORMAL HIGH (ref 0–99)
NonHDL: 163.45
Total CHOL/HDL Ratio: 5
Triglycerides: 157 mg/dL — ABNORMAL HIGH (ref 0.0–149.0)
VLDL: 31.4 mg/dL (ref 0.0–40.0)

## 2021-02-01 LAB — COMPREHENSIVE METABOLIC PANEL
ALT: 11 U/L (ref 0–35)
AST: 15 U/L (ref 0–37)
Albumin: 4.4 g/dL (ref 3.5–5.2)
Alkaline Phosphatase: 59 U/L (ref 39–117)
BUN: 12 mg/dL (ref 6–23)
CO2: 27 mEq/L (ref 19–32)
Calcium: 9.3 mg/dL (ref 8.4–10.5)
Chloride: 105 mEq/L (ref 96–112)
Creatinine, Ser: 0.85 mg/dL (ref 0.40–1.20)
GFR: 77.34 mL/min (ref >60.00)
Glucose, Bld: 101 mg/dL — ABNORMAL HIGH (ref 70–99)
Potassium: 4.2 mEq/L (ref 3.5–5.1)
Sodium: 139 mEq/L (ref 135–145)
Total Bilirubin: 0.4 mg/dL (ref 0.2–1.2)
Total Protein: 7 g/dL (ref 6.0–8.3)

## 2021-02-01 MED ORDER — LIDOCAINE HCL 2 % IJ SOLN
20.0000 mL | Freq: Once | INTRAMUSCULAR | Status: AC
  Administered 2021-02-01: 400 mg
  Filled 2021-02-01: qty 40

## 2021-02-01 MED ORDER — ROPIVACAINE HCL 2 MG/ML IJ SOLN
9.0000 mL | Freq: Once | INTRAMUSCULAR | Status: AC
  Administered 2021-02-01: 9 mL via PERINEURAL
  Filled 2021-02-01: qty 10

## 2021-02-01 MED ORDER — MIDAZOLAM HCL 5 MG/5ML IJ SOLN
1.0000 mg | INTRAMUSCULAR | Status: DC | PRN
  Administered 2021-02-01 (×2): 0.5 mg via INTRAVENOUS
  Administered 2021-02-01: 1 mg via INTRAVENOUS
  Filled 2021-02-01: qty 5

## 2021-02-01 MED ORDER — FENTANYL CITRATE (PF) 100 MCG/2ML IJ SOLN
25.0000 ug | INTRAMUSCULAR | Status: AC | PRN
  Administered 2021-02-01 (×2): 25 ug via INTRAVENOUS
  Filled 2021-02-01: qty 2

## 2021-02-01 MED ORDER — TRIAMCINOLONE ACETONIDE 40 MG/ML IJ SUSP
40.0000 mg | Freq: Once | INTRAMUSCULAR | Status: AC
  Administered 2021-02-01: 40 mg
  Filled 2021-02-01: qty 1

## 2021-02-01 MED ORDER — HYDROCODONE-ACETAMINOPHEN 5-325 MG PO TABS: 1.0000 | ORAL_TABLET | Freq: Three times a day (TID) | ORAL | 0 refills | Status: DC | PRN

## 2021-02-01 MED ORDER — LACTATED RINGERS IV SOLN
1000.0000 mL | Freq: Once | INTRAVENOUS | Status: AC
  Administered 2021-02-01: 1000 mL via INTRAVENOUS

## 2021-02-01 NOTE — Progress Notes (Signed)
Safety precautions to be maintained throughout the outpatient stay will include: orient to surroundings, keep bed in low position, maintain call bell within reach at all times, provide assistance with transfer out of bed and ambulation.  

## 2021-02-01 NOTE — Progress Notes (Addendum)
PROVIDER NOTE: Information contained herein reflects review and annotations entered in association with encounter. Interpretation of such information and data should be left to medically-trained personnel. Information provided to patient can be located elsewhere in the medical record under "Patient Instructions". Document created using STT-dictation technology, any transcriptional errors that may result from process are unintentional.    Patient: Karen Edwards  Service Category: Procedure  Provider: Gaspar Cola, MD  DOB: 1966/04/20  DOS: 02/01/2021  Location: [facility]  MRN: [id number]  Setting: Ambulatory - outpatient  Referring Provider: Pleas Koch, NP  Type: Established Patient  Specialty: Interventional Pain Management  PCP: Pleas Koch, NP   Primary Reason for Visit: Interventional Pain Management Treatment. CC: Back Pain (low)  Procedure:          Anesthesia, Analgesia, Anxiolysis:  Type: Thermal Lumbar Facet, Medial Branch Radiofrequency Ablation/Neurotomy  #2 (last done 01/11/2020) Primary Purpose: Therapeutic Region: Posterolateral Lumbosacral Spine Level: L2, L3, L4, L5, & S1 Medial Branch Level(s). These levels will denervate the L3-4, L4-5, and the L5-S1 lumbar facet joints. Laterality: Right  Type: Moderate (Conscious) Sedation combined with Local Anesthesia Indication(s): Analgesia and Anxiety Route: Intravenous (IV) IV Access: Secured Sedation: Meaningful verbal contact was maintained at all times during the procedure  Local Anesthetic: Lidocaine 1-2%  Position: Prone   Indications: 1. 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 Spondylosis without myelopathy or radiculopathy, lumbosacral region   3. Lumbar facet hypertrophy (Bilateral)   4. DDD (degenerative disc disease), lumbar   5. Chronic low back pain (Bilateral) w/o sciatica   6. Presence of neurostimulator (Thoracolumbar)    Karen Edwards has been dealing with the above  chronic pain for longer than three months and has either failed to respond, was unable to tolerate, or simply did not get enough benefit from other more conservative therapies including, but not limited to: 1. Over-the-counter medications 2. Anti-inflammatory medications 3. Muscle relaxants 4. Membrane stabilizers 5. Opioids 6. Physical therapy and/or chiropractic manipulation 7. Modalities (Heat, ice, etc.) 8. Invasive techniques such as nerve blocks. Karen Edwards has attained more than 50% relief of the pain from a series of diagnostic injections conducted in separate occasions.  Pain Score: Pre-procedure: 7 /10 Post-procedure: 0-No pain/10   Note: Today, the patient reports still having pain in both sides of the lower back with the right side being worse than the left.  However, she is concerned about some pain that she is having running down the left lower extremity through the lateral aspect of the leg down to the knee.  Physical exam today confirmed the patient to be having bilateral lumbar facet arthralgia on hyperextension on rotation as well as Karen Edwards maneuver.  However, in addition to this, the patient also had bilateral sacroiliac joint arthralgia and bilateral hip joint arthralgia upon performing the provocative Patrick maneuver.  In the case of the left side, where her primary complaints are, she also demonstrated decreased range of motion of the hip joint on abduction and lateral rotation with exact reproduction of the patient's pain.  Today I went over and reviewed the x-rays that she has available in the last time that she had an x-ray of the hip joints was in 2018 at which time there was no acute pathology.  Today we will go ahead and reorder those x-rays and hopefully have them compare to the ones in 2018 to see what has changed.  In addition to this, the patient also indicated still having some  pain over the left buttocks area where her stimulator IPG unit is located.  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  Pre-op H&P Assessment:  Karen Edwards is a 55 y.o. (year old), female patient, seen today for interventional treatment. She  has a past surgical history that includes Cesarean section; Ankle surgery (Right, 03/02/2014); Hemorrhoid surgery (N/A, 06/16/2015); Cervical spine surgery (02/21/2016); Cesarean section with bilateral tubal ligation (1990); Colonoscopy (04/19/2015); Hysteroscopy with D & C (12/24/2013); Colonoscopy with propofol (N/A, 05/18/2018); Esophagogastroduodenoscopy (egd) with propofol (N/A, 05/18/2018); Spinal cord stimulator insertion (N/A, 06/18/2018); Tubal ligation; and Lumbar spinal cord simulator revision (N/A, 03/14/2020). Karen Edwards has a current medication list which includes the following prescription(s): acetaminophen, biotin, fluoxetine, hydrocodone-acetaminophen, [START ON 02/08/2021] hydrocodone-acetaminophen, naloxegol oxalate, fish oil, ondansetron, oxycodone, [START ON 02/14/2021] oxycodone, [START ON 03/16/2021] oxycodone, scopolamine, trazodone, turmeric, lidocaine, and methocarbamol, and the following Facility-Administered Medications: midazolam. Her primarily concern today is the Back Pain (low)  Initial Vital Signs:  Pulse/HCG Rate: 73ECG Heart Rate: (!) 55 Temp: (!) 97.2 F (36.2 C) Resp: 16 BP: 124/75 SpO2: 99 %  BMI: Estimated body mass index is 26.45 kg/m as calculated from the following:   Height as of this encounter: 5\' 1"  (1.549 m).   Weight as of this encounter: 140 lb (63.5 kg).  Risk Assessment: Allergies: Reviewed. She has No Known Allergies.  Allergy Precautions: None required Coagulopathies: Reviewed. None identified.  Blood-thinner therapy: None at this time Active Infection(s): Reviewed. None identified. Karen Edwards is afebrile  Site Confirmation:  Karen Edwards was asked to confirm the procedure and laterality before marking the site Procedure checklist: Completed Consent: Before the procedure and under the influence of no sedative(s), amnesic(s), or anxiolytics, the patient was informed of the treatment options, risks and possible complications. To fulfill our ethical and legal obligations, as recommended by the American Medical Association's Code of Ethics, I have informed the patient of my clinical impression; the nature and purpose of the treatment or procedure; the risks, benefits, and possible complications of the intervention; the alternatives, including doing nothing; the risk(s) and benefit(s) of the alternative treatment(s) or procedure(s); and the risk(s) and benefit(s) of doing nothing. The patient was provided information about the general risks and possible complications associated with the procedure. These may include, but are not limited to: failure to achieve desired goals, infection, bleeding, organ or nerve damage, allergic reactions, paralysis, and death. In addition, the patient was informed of those risks and complications associated to Spine-related procedures, such as failure to decrease pain; infection (i.e.: Meningitis, epidural or intraspinal abscess); bleeding (i.e.: epidural hematoma, subarachnoid hemorrhage, or any other type of intraspinal or peri-dural bleeding); organ or nerve damage (i.e.: Any type of peripheral nerve, nerve root, or spinal cord injury) with subsequent damage to sensory, motor, and/or autonomic systems, resulting in permanent pain, numbness, and/or weakness of one or several areas of the body; allergic reactions; (i.e.: anaphylactic reaction); and/or death. Furthermore, the patient was informed of those risks and complications associated with the medications. These include, but are not limited to: allergic reactions (i.e.: anaphylactic or anaphylactoid reaction(s)); adrenal axis suppression; blood sugar  elevation that in diabetics may result in ketoacidosis or comma; water retention that in patients with history of congestive heart failure may result in shortness of breath, pulmonary edema, and decompensation with resultant heart failure; weight  gain; swelling or edema; medication-induced neural toxicity; particulate matter embolism and blood vessel occlusion with resultant organ, and/or nervous system infarction; and/or aseptic necrosis of one or more joints. Finally, the patient was informed that Medicine is not an exact science; therefore, there is also the possibility of unforeseen or unpredictable risks and/or possible complications that may result in a catastrophic outcome. The patient indicated having understood very clearly. We have given the patient no guarantees and we have made no promises. Enough time was given to the patient to ask questions, all of which were answered to the patient's satisfaction. Ms. Bracken has indicated that she wanted to continue with the procedure. Attestation: I, the ordering provider, attest that I have discussed with the patient the benefits, risks, side-effects, alternatives, likelihood of achieving goals, and potential problems during recovery for the procedure that I have provided informed consent. Date  Time: 02/01/2021 12:02 PM  Pre-Procedure Preparation:  Monitoring: As per clinic protocol. Respiration, ETCO2, SpO2, BP, heart rate and rhythm monitor placed and checked for adequate function Safety Precautions: Patient was assessed for positional comfort and pressure points before starting the procedure. Time-out: I initiated and conducted the "Time-out" before starting the procedure, as per protocol. The patient was asked to participate by confirming the accuracy of the "Time Out" information. Verification of the correct person, site, and procedure were performed and confirmed by me, the nursing staff, and the patient. "Time-out" conducted as per Joint  Commission's Universal Protocol (UP.01.01.01). Time: 1225  Description of Procedure:          Laterality: Right Levels:  L2, L3, L4, L5, & S1 Medial Branch Level(s), at the L3-4, L4-5, and the L5-S1 lumbar facet joints. Area Prepped: Lumbosacral DuraPrep (Iodine Povacrylex [0.7% available iodine] and Isopropyl Alcohol, 74% w/w) Safety Precautions: Aspiration looking for blood return was conducted prior to all injections. At no point did we inject any substances, as a needle was being advanced. Before injecting, the patient was told to immediately notify me if she was experiencing any new onset of "ringing in the ears, or metallic taste in the mouth". No attempts were made at seeking any paresthesias. Safe injection practices and needle disposal techniques used. Medications properly checked for expiration dates. SDV (single dose vial) medications used. After the completion of the procedure, all disposable equipment used was discarded in the proper designated medical waste containers. Local Anesthesia: Protocol guidelines were followed. The patient was positioned over the fluoroscopy table. The area was prepped in the usual manner. The time-out was completed. The target area was identified using fluoroscopy. A 12-in long, straight, sterile hemostat was used with fluoroscopic guidance to locate the targets for each level blocked. Once located, the skin was marked with an approved surgical skin marker. Once all sites were marked, the skin (epidermis, dermis, and hypodermis), as well as deeper tissues (fat, connective tissue and muscle) were infiltrated with a small amount of a short-acting local anesthetic, loaded on a 10cc syringe with a 25G, 1.5-in  Needle. An appropriate amount of time was allowed for local anesthetics to take effect before proceeding to the next step. Local Anesthetic: Lidocaine 2.0% The unused portion of the local anesthetic was discarded in the proper designated containers. Technical  explanation of process:  Radiofrequency Ablation (RFA) L2 Medial Branch Nerve RFA: The target area for the L2 medial branch is at the junction of the postero-lateral aspect of the superior articular process and the superior, posterior, and medial edge of the transverse process of L3. Under  fluoroscopic guidance, a Radiofrequency needle was inserted until contact was made with os over the superior postero-lateral aspect of the pedicular shadow (target area). Sensory and motor testing was conducted to properly adjust the position of the needle. Once satisfactory placement of the needle was achieved, the numbing solution was slowly injected after negative aspiration for blood. 2.0 mL of the nerve block solution was injected without difficulty or complication. After waiting for at least 3 minutes, the ablation was performed. Once completed, the needle was removed intact. L3 Medial Branch Nerve RFA: The target area for the L3 medial branch is at the junction of the postero-lateral aspect of the superior articular process and the superior, posterior, and medial edge of the transverse process of L4. Under fluoroscopic guidance, a Radiofrequency needle was inserted until contact was made with os over the superior postero-lateral aspect of the pedicular shadow (target area). Sensory and motor testing was conducted to properly adjust the position of the needle. Once satisfactory placement of the needle was achieved, the numbing solution was slowly injected after negative aspiration for blood. 2.0 mL of the nerve block solution was injected without difficulty or complication. After waiting for at least 3 minutes, the ablation was performed. Once completed, the needle was removed intact. L4 Medial Branch Nerve RFA: The target area for the L4 medial branch is at the junction of the postero-lateral aspect of the superior articular process and the superior, posterior, and medial edge of the transverse process of L5. Under  fluoroscopic guidance, a Radiofrequency needle was inserted until contact was made with os over the superior postero-lateral aspect of the pedicular shadow (target area). Sensory and motor testing was conducted to properly adjust the position of the needle. Once satisfactory placement of the needle was achieved, the numbing solution was slowly injected after negative aspiration for blood. 2.0 mL of the nerve block solution was injected without difficulty or complication. After waiting for at least 3 minutes, the ablation was performed. Once completed, the needle was removed intact. L5 Medial Branch Nerve RFA: The target area for the L5 medial branch is at the junction of the postero-lateral aspect of the superior articular process of S1 and the superior, posterior, and medial edge of the sacral ala. Under fluoroscopic guidance, a Radiofrequency needle was inserted until contact was made with os over the superior postero-lateral aspect of the pedicular shadow (target area). Sensory and motor testing was conducted to properly adjust the position of the needle. Once satisfactory placement of the needle was achieved, the numbing solution was slowly injected after negative aspiration for blood. 2.0 mL of the nerve block solution was injected without difficulty or complication. After waiting for at least 3 minutes, the ablation was performed. Once completed, the needle was removed intact. S1 Medial Branch Nerve RFA: The target area for the S1 medial branch is located inferior to the junction of the S1 superior articular process and the L5 inferior articular process, posterior, inferior, and lateral to the 6 o'clock position of the L5-S1 facet joint, just superior to the S1 posterior foramen. Under fluoroscopic guidance, the Radiofrequency needle was advanced until contact was made with os over the Target area. Sensory and motor testing was conducted to properly adjust the position of the needle. Once satisfactory  placement of the needle was achieved, the numbing solution was slowly injected after negative aspiration for blood. 2.0 mL of the nerve block solution was injected without difficulty or complication. After waiting for at least 3 minutes, the ablation was  performed. Once completed, the needle was removed intact. Radiofrequency lesioning (ablation):  Radiofrequency Generator: NeuroTherm [id number] Sensory Stimulation Parameters: 50 Hz was used to locate & identify the nerve, making sure that the needle was positioned such that there was no sensory stimulation below 0.3 V or above 0.7 V. Motor Stimulation Parameters: 2 Hz was used to evaluate the motor component. Care was taken not to lesion any nerves that demonstrated motor stimulation of the lower extremities at an output of less than 2.5 times that of the sensory threshold, or a maximum of 2.0 V. Lesioning Technique Parameters: Standard Radiofrequency settings. (Not bipolar or pulsed.) Temperature Settings: 80 degrees C Lesioning time: 60 seconds Intra-operative Compliance: Compliant Materials & Medications: Needle(s) (Electrode/Cannula) Type: Teflon-coated, curved tip, Radiofrequency needle(s) Gauge: 22G Length: 10cm Numbing solution: 0.2% PF-Ropivacaine + Triamcinolone (40 mg/mL) diluted to a final concentration of 4 mg of Triamcinolone/mL of Ropivacaine The unused portion of the solution was discarded in the proper designated containers.  Once the entire procedure was completed, the treated area was cleaned, making sure to leave some of the prepping solution back to take advantage of its long term bactericidal properties.    Illustration of the posterior view of the lumbar spine and the posterior neural structures. Laminae of L2 through S1 are labeled. DPRL5, dorsal primary ramus of L5; DPRS1, dorsal primary ramus of S1; DPR3, dorsal primary ramus of L3; FJ, facet (zygapophyseal) joint L3-L4; I, inferior articular process of L4; LB1, lateral branch  of dorsal primary ramus of L1; IAB, inferior articular branches from L3 medial branch (supplies L4-L5 facet joint); IBP, intermediate branch plexus; MB3, medial branch of dorsal primary ramus of L3; NR3, third lumbar nerve root; S, superior articular process of L5; SAB, superior articular branches from L4 (supplies L4-5 facet joint also); TP3, transverse process of L3.  Vitals:   02/01/21 1300 02/01/21 1308 02/01/21 1317 02/01/21 1327  BP: 138/69 121/60 (!) 129/53 (!) 121/57  Pulse: 61     Resp: 16 19 18 15   Temp:  97.7 F (36.5 C)  97.6 F (36.4 C)  TempSrc:  Temporal  Temporal  SpO2: 96% 99% 95% 99%  Weight:      Height:       Start Time: 1225 hrs. End Time:   hrs.  Imaging Guidance (Spinal):          Type of Imaging Technique: Fluoroscopy Guidance (Spinal) Indication(s): Assistance in needle guidance and placement for procedures requiring needle placement in or near specific anatomical locations not easily accessible without such assistance. Exposure Time: Please see nurses notes. Contrast: None used. Fluoroscopic Guidance: I was personally present during the use of fluoroscopy. "Tunnel Vision Technique" used to obtain the best possible view of the target area. Parallax error corrected before commencing the procedure. "Direction-depth-direction" technique used to introduce the needle under continuous pulsed fluoroscopy. Once target was reached, antero-posterior, oblique, and lateral fluoroscopic projection used confirm needle placement in all planes. Images permanently stored in EMR. Interpretation: No contrast injected. I personally interpreted the imaging intraoperatively. Adequate needle placement confirmed in multiple planes. Permanent images saved into the patient's record.  Antibiotic Prophylaxis:   Anti-infectives (From admission, onward)   None     Indication(s): None identified  Post-operative Assessment:  Post-procedure Vital Signs:  Pulse/HCG Rate: 61 (nsr)60 Temp:  97.6 F (36.4 C) Resp: 15 BP: (!) 121/57 SpO2: 99 %  EBL: None  Complications: No immediate post-treatment complications observed by team, or reported by patient.  Note: The patient tolerated  the entire procedure well. A repeat set of vitals were taken after the procedure and the patient was kept under observation following institutional policy, for this type of procedure. Post-procedural neurological assessment was performed, showing return to baseline, prior to discharge. The patient was provided with post-procedure discharge instructions, including a section on how to identify potential problems. Should any problems arise concerning this procedure, the patient was given instructions to immediately contact us, at any time, without hesitation. In any case, we plan to contact the patient by telephone for a follow-up status report regarding this interventional procedure.  Comments:  No additional relevant information.  Plan of Care  Orders:  Orders Placed This Encounter  Procedures  . Radiofrequency,Lumbar    Scheduling Instructions:     Side(s): Right-sided     Level: L3-4, L4-5, & L5-S1 Facets (L2, L3, L4, L5, & S1 Medial Branch Nerves)     Sedation: With Sedation.     Timeframe: Today    Order Specific Question:   Where will this procedure be performed?    Answer:   [facility]  . DG PAIN CLINIC C-ARM 1-60 MIN NO REPORT    Intraoperative interpretation by procedural physician at [place].    Standing Status:   Standing    Number of Occurrences:   1    Order Specific Question:   Reason for exam:    Answer:   Assistance in needle guidance and placement for procedures requiring needle placement in or near specific anatomical locations not easily accessible without such assistance.  . DG Si Joints    Standing Status:   Future    Standing Expiration Date:   03/03/2021    Scheduling Instructions:     Imaging must be done as soon as possible. Inform patient that  order will expire within 30 days and I will not renew it.    Order Specific Question:   Reason for Exam (SYMPTOM  OR DIAGNOSIS REQUIRED)    Answer:   Sacroiliac joint pain    Order Specific Question:   Is the patient pregnant?    Answer:   No    Order Specific Question:   Preferred imaging location?    Answer:   [facility]    Order Specific Question:   Call Results- Best Contact Number?    Answer:   [phone] ([facility])    Order Specific Question:   Release to patient    Answer:   Immediate  . DG HIP UNILAT W OR W/O PELVIS 2-3 VIEWS RIGHT    Please describe any evidence of DJD, such as joint narrowing, asymmetry, cysts, or any anomalies in bone density, production, or erosion.    Standing Status:   Future    Standing Expiration Date:   03/03/2021    Scheduling Instructions:     Imaging must be done as soon as possible. Inform patient that order will expire within 30 days and I will not renew it.    Order Specific Question:   Reason for Exam (SYMPTOM  OR DIAGNOSIS REQUIRED)    Answer:   Sacroiliac joint pain    Order Specific Question:   Is the patient pregnant?    Answer:   No    Order Specific Question:   Preferred imaging location?    Answer:   [facility]    Order Specific Question:   Call Results- Best Contact Number?    Answer:   [phone] [facility])  Order Specific Question:   Release to patient    Answer:   Immediate  . DG HIP UNILAT W OR W/O PELVIS 2-3 VIEWS LEFT    Please describe any evidence of DJD, such as joint narrowing, asymmetry, cysts, or any anomalies in bone density, production, or erosion.    Standing Status:   Future    Standing Expiration Date:   03/03/2021    Scheduling Instructions:     Imaging must be done as soon as possible. Inform patient that order will expire within 30 days and I will not renew it.    Order Specific Question:   Reason for Exam (SYMPTOM  OR DIAGNOSIS REQUIRED)    Answer:   Sacroiliac joint pain     Order Specific Question:   Is the patient pregnant?    Answer:   No    Order Specific Question:   Preferred imaging location?    Answer:   [facility]    Order Specific Question:   Call Results- Best Contact Number?    Answer:   [phone] [facility])  . Informed Consent Details: Physician/Practitioner Attestation; Transcribe to consent form and obtain patient signature    Nursing Order: Transcribe to consent form and obtain patient signature. Note: Always confirm laterality of pain with Ms. Tye Savoy, before procedure.    Order Specific Question:   Physician/Practitioner attestation of informed consent for procedure/surgical case    Answer:   I, the physician/practitioner, attest that I have discussed with the patient the benefits, risks, side effects, alternatives, likelihood of achieving goals and potential problems during recovery for the procedure that I have provided informed consent.    Order Specific Question:   Procedure    Answer:   Lumbar Facet Radiofrequency Ablation    Order Specific Question:   Physician/Practitioner performing the procedure    Answer:   Kaulin Chaves A. 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  . Provide equipment / supplies at bedside    "Radiofrequency Tray"; Large hemostat (x1); Small hemostat (x1); Towels (x8); 4x4 sterile sponge pack (x1) Needle type: Teflon-coated Radiofrequency Needle (Disposable  single use) Size: Regular Quantity: 5    Standing Status:   Standing    Number of Occurrences:   1    Order Specific Question:   Specify    Answer:   Radiofrequency Tray   Chronic Opioid Analgesic:  Oxycodone IR 5 mg, 1 tab PO BID (10 mg/day of  oxycodone) MME/day:15mg /day.   Medications ordered for procedure: Meds ordered this encounter  Medications  . lidocaine (XYLOCAINE) 2 % (with pres) injection 400 mg  . lactated ringers infusion 1,000 mL  . midazolam (VERSED) 5 MG/5ML injection 1-2 mg    Make sure Flumazenil is available in the pyxis when using this medication. If oversedation occurs, administer 0.2 mg IV over 15 sec. If after 45 sec no response, administer 0.2 mg again over 1 min; may repeat at 1 min intervals; not to exceed 4 doses (1 mg)  . fentaNYL (SUBLIMAZE) injection 25-50 mcg    Make sure Narcan is available in the pyxis when using this medication. In the event of respiratory depression (RR< 8/min): Titrate NARCAN (naloxone) in increments of 0.1 to 0.2 mg IV at  2-3 minute intervals, until desired degree of reversal.  . ropivacaine (PF) 2 mg/mL (0.2%) (NAROPIN) injection 9 mL  . triamcinolone acetonide (KENALOG-40) injection 40 mg  . HYDROcodone-acetaminophen (NORCO/VICODIN) 5-325 MG tablet    Sig: Take 1 tablet by mouth every 8 (eight) hours as needed for up to 7 days for severe pain. Must last 7 days.    Dispense:  21 tablet    Refill:  0    For acute post-operative pain. Not to be refilled. Must last 7 days.  Marland Kitchen HYDROcodone-acetaminophen (NORCO/VICODIN) 5-325 MG tablet    Sig: Take 1 tablet by mouth every 8 (eight) hours as needed for up to 7 days for severe pain. Must last for 7 days.    Dispense:  21 tablet    Refill:  0    For acute post-operative pain. Not to be refilled. Must last 7 days.   Medications administered: We administered lidocaine, lactated ringers, midazolam, fentaNYL, ropivacaine (PF) 2 mg/mL (0.2%), and triamcinolone acetonide.  See the medical record for exact dosing, route, and time of administration.  Follow-up plan:   Return in about 2 weeks (around 02/15/2021) for on afternoon of procedure day, (F2F), (PPE) and to review hip and SI x-rays.       Interventional Therapies  Risk   Complexity Considerations:   WNL   Planned  Pending:   Therapeutic right lumbar facet RFA #2 (02/01/2021)    Under consideration:   Possible bilateral SI joint RFA #1 Diagnostic bilateral IA hip joint injection #1 Diagnostic left superior cluneal NB #1  Diagnostic bilateral femoral + obturator NB Diagnostic right L1-2LESI Diagnostic [id number] Diagnostic bilateral IA shoulder joint injection Diagnostic bilateral suprascapular NB   Completed:   Therapeutic right cervical ESI x1 (08/24/2020) Diagnostic bilateral GONB x1(08/19/2019) Palliative right cervical facet MBB x3(07/23/2018)  Palliative left cervical facet MBB x4(04/01/2019)  Palliative right cervical facet RFA x1 (09/17/2018) Palliative left cervical facet RFA x1 (05/04/2019) Palliative right L2-3 interlaminar LESI x2  Palliative right L1 TFESI x1 Bilateral lumbar spinal cord stimulator trial (done) (05/14/2018) (by me)  Bilateral lumbar spinal cord stimulator implant (09/28/2019) (by me)  Bilateral spinal cord stimulator revision (03/14/2020) (by me)  Diagnostic bilateral lumbar facet MBB x3  Diagnostic bilateral SI joint block x2  Palliative left lumbar facet RFA x1 (12/28/2019)  Therapeutic right lumbar facet RFA x2 (02/01/2021)    Therapeutic  Palliative (PRN) options:   Therapeutic right cervical ESI #2  Diagnostic bilateral GONB #2 Palliative bilateral cervical facet blocks #5 Palliative right cervical facet RFA #2  Palliative left cervical facet RFA #2  Palliative right L2-3 interlaminar LESI #3  Palliative right L1 TFESI #2 Diagnostic bilateral lumbar facet block #4  Diagnostic bilateral SI joint block #3  Palliative left lumbar facet RFA #2  Palliative right lumbar facet RFA #2     Recent Visits Date Type Provider Dept  01/15/21 Office Visit Milinda Pointer, MD [facility]  11/21/20 Telemedicine Milinda Pointer, MD [facility]  Showing recent visits within past 90  days and meeting all other requirements Today's Visits Date Type Provider Dept  02/01/21 Procedure visit Milinda Pointer, MD [facility]  Showing today's visits and meeting all other requirements Future Appointments Date Type Provider Dept  02/22/21 Appointment Milinda Pointer, MD [facility]  04/09/21 Appointment Milinda Pointer, MD [facility]  Showing future appointments within next 90 days and meeting all other requirements  Disposition: Discharge home  Discharge (Date  Time): 02/01/2021; 1328 hrs.  Primary Care Physician: Pleas Koch, NP Location: [facility] Note by: Gaspar Cola, MD Date: 02/01/2021; Time: 5:20 PM  Disclaimer:  Medicine is not an Chief Strategy Officer. The only guarantee in medicine is that nothing is guaranteed. It is important to note that the decision to proceed with this intervention was based on the information collected from the patient. The Data and conclusions were drawn from the patient's questionnaire, the interview, and the physical examination. Because the information was provided in large part by the patient, it cannot be guaranteed that it has not been purposely or unconsciously manipulated. Every effort has been made to obtain as much relevant data as possible for this evaluation. It is important to note that the conclusions that lead to this procedure are derived in large part from the available data. Always take into account that the treatment will also be dependent on availability of resources and existing treatment guidelines, considered by other Pain Management Practitioners as being common knowledge and practice, at the time of the intervention. For Medico-Legal purposes, it is also important to point out that variation in procedural techniques and pharmacological choices are the acceptable norm. The indications, contraindications, technique, and results of the above procedure  should only be interpreted and judged by a Board-Certified Interventional Pain Specialist with extensive familiarity and expertise in the same exact procedure and technique.

## 2021-02-01 NOTE — Patient Instructions (Signed)

## 2021-02-22 ENCOUNTER — Ambulatory Visit: Payer: Medicare PPO | Admitting: Pain Medicine

## 2021-02-22 NOTE — Progress Notes (Deleted)
Patient canceled appointment 

## 2021-02-23 ENCOUNTER — Ambulatory Visit
Admission: RE | Admit: 2021-02-23 | Discharge: 2021-02-23 | Disposition: A | Discharge status: Home / Self Care | Payer: Medicare PPO | Attending: Pain Medicine | Admitting: Pain Medicine

## 2021-02-23 ENCOUNTER — Ambulatory Visit
Admission: RE | Admit: 2021-02-23 | Discharge: 2021-02-23 | Disposition: A | Discharge status: Home / Self Care | Payer: Medicare PPO | Source: Ambulatory Visit | Attending: Pain Medicine | Admitting: Pain Medicine

## 2021-02-23 DIAGNOSIS — M25551 Pain in right hip: Secondary | ICD-10-CM | POA: Diagnosis not present

## 2021-02-23 DIAGNOSIS — M533 Sacrococcygeal disorders, not elsewhere classified: Secondary | ICD-10-CM | POA: Insufficient documentation

## 2021-02-23 DIAGNOSIS — M25552 Pain in left hip: Secondary | ICD-10-CM | POA: Diagnosis not present

## 2021-02-23 DIAGNOSIS — G8929 Other chronic pain: Secondary | ICD-10-CM | POA: Insufficient documentation

## 2021-02-25 NOTE — Progress Notes (Signed)
PROVIDER NOTE: Information contained herein reflects review and annotations entered in association with encounter. Interpretation of such information and data should be left to medically-trained personnel. Information provided to patient can be located elsewhere in the medical record under "Patient Instructions". Document created using STT-dictation technology, any transcriptional errors that may result from process are unintentional.    Patient: Karen Edwards  Service Category: E/M  Provider: Gaspar Cola, Edwards  DOB: 13-Feb-1966  DOS: 02/26/2021  Specialty: Interventional Pain Management  MRN: [id number]  Setting: Ambulatory outpatient  PCP: Pleas Koch, NP  Type: Established Patient    Referring Provider: Pleas Koch, NP  Location: Office  Delivery: Face-to-face     HPI  [name], a 55 y.o. year old female, is here today because of her Chronic bilateral low back pain without sciatica [M54.50, G89.29]. Karen Edwards primary complain today is Back Pain Last encounter: My last encounter with her was on 02/22/2021. Pertinent problems: Karen Edwards has Chronic low back pain (1ry area of Pain) (Bilateral) (R>L) w/ sciatica (Bilateral); DDD (degenerative disc disease), lumbar; DDD (degenerative disc disease), cervical; Chronic occipital neuralgia (5th area of Pain) (Bilateral) (R>L); Lumbar facet syndrome (Bilateral) (L>R); DDD (degenerative disc disease), lumbosacral; Lumbar radicular pain (Right) (L4); Sacroiliac joint dysfunction (Bilateral); Chronic neck pain (4th area of Pain) (Bilateral) (R>L); Numbness and tingling of upper extremity (Right); Chronic upper extremity pain (Bilateral) (R>L); Chronic sacroiliac joint pain (Bilateral) (L>R); Lumbar facet hypertrophy (Bilateral); L1-2 disc extrusion (Right); Cervical foraminal stenosis (C5-6) (Bilateral); Chronic pain syndrome; Chronic cervical radicular pain (Right: C6/C7) (Left: C5/T1); Chronic lumbar radicular pain; Chronic  hip pain (3ry area of Pain) (Bilateral) (R>L); Chronic lower extremity pain (2ry area of Pain) (Bilateral) (R>L); Chronic shoulder pain (Bilateral) (L>R); Chronic musculoskeletal pain; Lumbar spondylosis; Cervical facet syndrome (Bilateral) (R>L); Myofascial pain; Spondylosis without myelopathy or radiculopathy, cervical region; Cervicalgia; S/P insertion of Epidural Neurostimulator (SCS) (Bilateral); Chronic neck pain with history of cervical spinal surgery; 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[facility]); Trigger point with back pain (Right); Cervicogenic headache (Bilateral); Occipital headache (Bilateral); Chronic tension-type headache, intractable; Cervico-occipital neuralgia (Bilateral); Pain due to any device, implant or graft (SCS battery) (Left PSIS area); Spondylosis without myelopathy or radiculopathy, lumbosacral region; History of cervical spinal surgery (C5-7 ACDF); Cervical radiculopathy at C6 (Bilateral); Chronic low back pain (Bilateral) w/o sciatica; and Presence of neurostimulator (Thoracolumbar) on their pertinent problem list. Pain Assessment: Severity of Chronic pain is reported as a 7 /10. Location: Back Lower,Right,Left/buttocks/hips down outside of leg to abouve the knee. Onset: More than a month ago. Quality: Aching,Constant,Burning,Radiating,Stabbing. Timing: Constant. Modifying factor(s): changing positions, rest. Vitals:  height is 5' (1.524 m) and weight is 140 lb (63.5 kg). Her temperature is 97.3 F (36.3 C) (abnormal). Her blood pressure is 108/65 and her pulse is 72. Her respiration is 16 and oxygen saturation is 100%.   Reason for encounter: post-procedure assessment.  The patient returns to clinic today for follow-up evaluation.  On 02/01/2021, less than 6 weeks ago, she had radiofrequency ablation of the right lumbar facets.  She is having a little bit of a flareup of her pain on the right side, which we believe secondary to a possible trigger point.  This is rather normal  after radiofrequency ablation secondary to the needling through the muscle group in order to get to the medial branches.  However, she indicates that she has also been experiencing some pain around the area of the spinal cord stimulator battery, which we have  to previously revised secondary to the battery coming closer to the skin.  She is pending to have radiofrequency ablation on the left side, which we will be scheduling, as soon as possible.  The patient came in today to follow-up and review the x-rays that were ordered on 02/01/2021 when she had done on 02/23/2021.  The x-rays of the hips and the SI joint were read as negative.  The patient does not show any event evidence of any acute pathology in the area.  PMP review shows the patient to have filled only 1 of 3 prescriptions written on 01/15/2021 suggesting that the patient is actually using less than 40 pills/month.  On future refills will lower the quantity from 40 to 30 pills.  RTCB: 04/09/2021 Nonopioids transfer 08/23/2020: Lidocaine 5% ointment and Robaxin  Post-Procedure Evaluation  Procedure (02/01/2021): Therapeutic right lumbar facet radiofrequency ablation #2 under fluoroscopic guidance and IV sedation Pre-procedure pain level: 7/10 Post-procedure: 0/10 (100% relief)  Sedation: Sedation provided.  Effectiveness during initial hour after procedure(Ultra-Short Term Relief): 100 %.  Local anesthetic used: Long-acting (4-6 hours) Effectiveness: Defined as any analgesic benefit obtained secondary to the administration of local anesthetics. This carries significant diagnostic value as to the etiological location, or anatomical origin, of the pain. Duration of benefit is expected to coincide with the duration of the local anesthetic used.  Effectiveness during initial 4-6 hours after procedure(Short-Term Relief): 100 %.  Long-term benefit: Defined as any relief past the pharmacologic duration of the local anesthetics.  Effectiveness past  the initial 6 hours after procedure(Long-Term Relief): 0 %.  This is probably due to the fact that he has not been 6 weeks yet since her radiofrequency.  Current benefits: Defined as benefit that persist at this time.   Analgesia:  Currently having an apparent trigger point flareup on the right side, near the area where the radiofrequency ablation was done.  He has not been 6 weeks since that radiofrequency ablation. Function: Limited benefit at this point since he has not been 6 weeks since her radiofrequency. ROM: Limited benefit at this point since he has not been 6 weeks since her radiofrequency.  Pharmacotherapy Assessment   Analgesic: Oxycodone IR 5 mg, 1 tab PO QD (5 mg/day of oxycodone) PRN MME/day:7.82m/day.   Monitoring: [place] PMP: PDMP reviewed during this encounter.       Pharmacotherapy: No side-effects or adverse reactions reported. Compliance: No problems identified. Effectiveness: Clinically acceptable.  GIgnatius Specking RN  02/26/2021  1:49 PM  Sign when Signing Visit Safety precautions to be maintained throughout the outpatient stay will include: orient to surroundings, keep bed in low position, maintain call bell within reach at all times, provide assistance with transfer out of bed and ambulation.     UDS:  Summary  Date Value Ref Range Status  01/15/2021 Note  Final    Comment:    ==================================================================== [employer] [facility] (MW) ==================================================================== Test                             Result       Flag       Units  Drug Absent but Declared for Prescription Verification   Oxycodone                      Not Detected UNEXPECTED ng/mg creat ==================================================================== Test  Result    Flag   Units      Ref Range   Creatinine              44               mg/dL       >=20 ==================================================================== Declared Medications:  The flagging and interpretation on this report are based on the  following declared medications.  Unexpected results may arise from  inaccuracies in the declared medications.   **Note: The testing scope of this panel includes these medications:   Oxycodone (Roxicodone)   **Note: The testing scope of this panel does not include the  following reported medications:   Acetaminophen (Tylenol)  Biotin  Fish Oil  Fluoxetine (Prozac)  Lidocaine (Xylocaine)  Methocarbamol (Robaxin)  Naloxegol (Movantik)  Ondansetron (Zofran)  Scopolamine  Trazodone (Desyrel)  Turmeric ==================================================================== For clinical consultation, please call [phone]. ====================================================================      ROS  Constitutional: Denies any fever or chills Gastrointestinal: No reported hemesis, hematochezia, vomiting, or acute GI distress Musculoskeletal: Denies any acute onset joint swelling, redness, loss of ROM, or weakness Neurological: No reported episodes of acute onset apraxia, aphasia, dysarthria, agnosia, amnesia, paralysis, loss of coordination, or loss of consciousness  Medication Review  Biotin, FLUoxetine, Fish Oil, Turmeric, acetaminophen, lidocaine, methocarbamol, naloxegol oxalate, ondansetron, oxyCODONE, scopolamine, and traZODone  History Review  Allergy: Karen Edwards has No Known Allergies. Drug: Karen Edwards  reports no history of drug use. Alcohol:  reports current alcohol use of about 1.0 standard drink of alcohol per week. Tobacco:  reports that she has been smoking cigarettes. She has a 18.50 pack-year smoking history. She has never used smokeless tobacco. Social: Karen Edwards  reports that she has been smoking cigarettes. She has a 18.50 pack-year smoking history. She has never used smokeless tobacco. She  reports current alcohol use of about 1.0 standard drink of alcohol per week. She reports that she does not use drugs. Medical:  has a past medical history of Anxiety, Chronic back pain, Degenerative joint disease, Depression, Headache, Hypotension, IBS (irritable bowel syndrome) (05/2015), and MVP (mitral valve prolapse). Surgical: Karen Edwards  has a past surgical history that includes Cesarean section; Ankle surgery (Right, 03/02/2014); Hemorrhoid surgery (N/A, 06/16/2015); Cervical spine surgery (02/21/2016); Cesarean section with bilateral tubal ligation (1990); Colonoscopy (04/19/2015); Hysteroscopy with D & C (12/24/2013); Colonoscopy with propofol (N/A, 05/18/2018); Esophagogastroduodenoscopy (egd) with propofol (N/A, 05/18/2018); Spinal cord stimulator insertion (N/A, 06/18/2018); Tubal ligation; and Lumbar spinal cord simulator revision (N/A, 03/14/2020). Family: family history includes Arthritis in her mother; Congestive Heart Failure in her maternal grandfather; Depression in her mother; Diabetes in her maternal aunt; Diabetes Mellitus I in her maternal grandmother; Gout in her maternal aunt; Hearing loss in her father; Heart attack in her maternal grandfather; Hyperlipidemia in her father and mother; Hypertension in her father and mother; Hypothyroidism in her mother; Kidney disease in her mother; Prostate cancer in her paternal grandfather; Stroke in her maternal grandmother; Thyroid disease in her maternal aunt; Vision loss in her mother.  Laboratory Chemistry Profile   Renal Lab Results  Component Value Date   BUN 12 02/01/2021   CREATININE 0.85 02/01/2021   BCR 8 (L) 09/08/2017   GFR 77.34 02/01/2021   GFRAA 93 09/08/2017   GFRNONAA 81 09/08/2017     Hepatic Lab Results  Component Value Date   AST 15 02/01/2021   ALT 11 02/01/2021   ALBUMIN 4.4 02/01/2021   ALKPHOS 59 02/01/2021  Electrolytes Lab Results  Component Value Date   NA 139 02/01/2021   K 4.2 02/01/2021   CL 105  02/01/2021   CALCIUM 9.3 02/01/2021   MG 2.1 09/08/2017     Bone Lab Results  Component Value Date   VD25OH 43.74 01/10/2016   25OHVITD1 51 09/08/2017   25OHVITD2 <1.0 09/08/2017   25OHVITD3 51 09/08/2017     Inflammation (CRP: Acute Phase) (ESR: Chronic Phase) Lab Results  Component Value Date   CRP 1.4 09/08/2017   ESRSEDRATE 11 09/08/2017       Note: Above Lab results reviewed.  Recent Imaging Review  DG HIP UNILAT W OR W/O PELVIS 2-3 VIEWS LEFT CLINICAL DATA:  Bilateral hip and SI joint pain.  EXAM: DG HIP (WITH OR WITHOUT PELVIS) 2-3V LEFT  COMPARISON:  09/08/2017  FINDINGS: There is no evidence of hip fracture or dislocation. There is no evidence of arthropathy or other focal bone abnormality. Spinal cord stimulator left buttock.  IMPRESSION: Negative.  Electronically Signed   By: Franchot Gallo M.D.   On: 02/26/2021 13:21 DG Si Joints CLINICAL DATA:  Bilateral SI joint pain.  EXAM: BILATERAL SACROILIAC JOINTS - 3+ VIEW  COMPARISON:  09/08/2017  FINDINGS: The sacroiliac joint spaces are maintained and there is no evidence of arthropathy. No other bone abnormalities are seen.  Spinal cord stimulator generator in the left buttock.  IMPRESSION: Negative.  Electronically Signed   By: Franchot Gallo M.D.   On: 02/26/2021 13:20 DG HIP UNILAT W OR W/O PELVIS 2-3 VIEWS RIGHT CLINICAL DATA:  Bilateral hip pain in SI joint pain.  EXAM: DG HIP (WITH OR WITHOUT PELVIS) 2-3V RIGHT  COMPARISON:  09/08/2017  FINDINGS: There is no evidence of hip fracture or dislocation. There is no evidence of arthropathy or other focal bone abnormality.  Spinal cord stimulator in the left buttock region.  IMPRESSION: Negative.  Electronically Signed   By: Franchot Gallo M.D.   On: 02/26/2021 13:19 Note: Reviewed        Physical Exam  General appearance: Well nourished, well developed, and well hydrated. In no apparent acute distress Mental status: Alert,  oriented x 3 (person, place, & time)       Respiratory: No evidence of acute respiratory distress Eyes: PERLA Vitals: BP 108/65   Pulse 72   Temp (!) 97.3 F (36.3 C)   Resp 16   Ht 5' (1.524 m)   Wt 140 lb (63.5 kg)   LMP 05/05/2017 (Exact Date)   SpO2 100%   BMI 27.34 kg/m  BMI: Estimated body mass index is 27.34 kg/m as calculated from the following:   Height as of this encounter: 5' (1.524 m).   Weight as of this encounter: 140 lb (63.5 kg). Ideal: Ideal body weight: 45.5 kg (100 lb 4.9 oz) Adjusted ideal body weight: 52.7 kg (116 lb 3 oz)  Assessment   Status Diagnosis  Controlled Controlled Controlled 1. Chronic low back pain (Bilateral) w/o sciatica   2. Chronic musculoskeletal pain   3. Trigger point with back pain (Right)   4. 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 Lumbar facet hypertrophy (Bilateral)      Updated Problems: Problem  Trigger point with back pain (Right)   Procedure:          Anesthesia, Analgesia, Anxiolysis:  Type: Lumbar paravertebral Trigger Point Injection (1-2 muscle groups)          CPT: 20552 Primary Purpose: Therapeutic Region: Posterior Lumbosacral Level: PSIS Target  Area: Erector spinae muscle and quadratus lumborum muscle Trigger Points Approach: Percutaneous, ipsilateral approach. Laterality: Right-Sided        Type: Local Anesthesia Indication(s): Analgesia         Local Anesthetic: Lidocaine 1-2% Route: Infiltration ([place]/IM) IV Access: Declined Sedation: Declined   Position: Sitting   Indications: 1. Chronic low back pain (Bilateral) w/o sciatica   2. Chronic musculoskeletal pain   3. Trigger point with back pain (Right)   4. 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 Lumbar facet hypertrophy (Bilateral)    Pain Score: Pre-procedure: 7 /10 Post-procedure: 7 /10   Pre-op H&P Assessment:  Karen Edwards is a 55 y.o. (year old), female patient, seen today for interventional treatment. She  has a past surgical  history that includes Cesarean section; Ankle surgery (Right, 03/02/2014); Hemorrhoid surgery (N/A, 06/16/2015); Cervical spine surgery (02/21/2016); Cesarean section with bilateral tubal ligation (1990); Colonoscopy (04/19/2015); Hysteroscopy with D & C (12/24/2013); Colonoscopy with propofol (N/A, 05/18/2018); Esophagogastroduodenoscopy (egd) with propofol (N/A, 05/18/2018); Spinal cord stimulator insertion (N/A, 06/18/2018); Tubal ligation; and Lumbar spinal cord simulator revision (N/A, 03/14/2020). Karen Edwards has a current medication list which includes the following prescription(s): acetaminophen, biotin, fluoxetine, fish oil, ondansetron, oxycodone, scopolamine, trazodone, turmeric, lidocaine, methocarbamol, and naloxegol oxalate. Her primarily concern today is the Back Pain  Initial Vital Signs:  Pulse/HCG Rate: 72  Temp: (!) 97.3 F (36.3 C) Resp: 16 BP: 108/65 SpO2: 100 %  BMI: Estimated body mass index is 27.34 kg/m as calculated from the following:   Height as of this encounter: 5' (1.524 m).   Weight as of this encounter: 140 lb (63.5 kg).  Risk Assessment: Allergies: Reviewed. She has No Known Allergies.  Allergy Precautions: None required Coagulopathies: Reviewed. None identified.  Blood-thinner therapy: None at this time Active Infection(s): Reviewed. None identified. Karen Edwards is afebrile  Site Confirmation: Karen Edwards was asked to confirm the procedure and laterality before marking the site Procedure checklist: Completed Consent: Before the procedure and under the influence of no sedative(s), amnesic(s), or anxiolytics, the patient was informed of the treatment options, risks and possible complications. To fulfill our ethical and legal obligations, as recommended by the American Medical Association's Code of Ethics, I have informed the patient of my clinical impression; the nature and purpose of the treatment or procedure; the risks, benefits, and possible complications of  the intervention; the alternatives, including doing nothing; the risk(s) and benefit(s) of the alternative treatment(s) or procedure(s); and the risk(s) and benefit(s) of doing nothing. The patient was provided information about the general risks and possible complications associated with the procedure. These may include, but are not limited to: failure to achieve desired goals, infection, bleeding, organ or nerve damage, allergic reactions, paralysis, and death. In addition, the patient was informed of those risks and complications associated to the procedure, such as failure to decrease pain; infection; bleeding; organ or nerve damage with subsequent damage to sensory, motor, and/or autonomic systems, resulting in permanent pain, numbness, and/or weakness of one or several areas of the body; allergic reactions; (i.e.: anaphylactic reaction); and/or death. Furthermore, the patient was informed of those risks and complications associated with the medications. These include, but are not limited to: allergic reactions (i.e.: anaphylactic or anaphylactoid reaction(s)); adrenal axis suppression; blood sugar elevation that in diabetics may result in ketoacidosis or comma; water retention that in patients with history of congestive heart failure may result in shortness of breath, pulmonary edema, and decompensation with resultant heart failure; weight gain;  swelling or edema; medication-induced neural toxicity; particulate matter embolism and blood vessel occlusion with resultant organ, and/or nervous system infarction; and/or aseptic necrosis of one or more joints. Finally, the patient was informed that Medicine is not an exact science; therefore, there is also the possibility of unforeseen or unpredictable risks and/or possible complications that may result in a catastrophic outcome. The patient indicated having understood very clearly. We have given the patient no guarantees and we have made no promises. Enough  time was given to the patient to ask questions, all of which were answered to the patient's satisfaction. Karen Edwards has indicated that she wanted to continue with the procedure. Attestation: I, the ordering provider, attest that I have discussed with the patient the benefits, risks, side-effects, alternatives, likelihood of achieving goals, and potential problems during recovery for the procedure that I have provided informed consent. Date  Time: 02/26/2021  1:44 PM  Pre-Procedure Preparation:  Monitoring: As per clinic protocol. Respiration, ETCO2, SpO2, BP, heart rate and rhythm monitor placed and checked for adequate function Safety Precautions: Patient was assessed for positional comfort and pressure points before starting the procedure. Time-out: I initiated and conducted the "Time-out" before starting the procedure, as per protocol. The patient was asked to participate by confirming the accuracy of the "Time Out" information. Verification of the correct person, site, and procedure were performed and confirmed by me, the nursing staff, and the patient. "Time-out" conducted as per Joint Commission's Universal Protocol (UP.01.01.01). Time: 1438  Description of Procedure:          Area Prepped: Entire             Region DuraPrep (Iodine Povacrylex [0.7% available iodine] and Isopropyl Alcohol, 74% w/w) Safety Precautions: Aspiration looking for blood return was conducted prior to all injections. At no point did we inject any substances, as a needle was being advanced. No attempts were made at seeking any paresthesias. Safe injection practices and needle disposal techniques used. Medications properly checked for expiration dates. SDV (single dose vial) medications used. Description of the Procedure: Protocol guidelines were followed. The patient was placed in position over the fluoroscopy table. The target area was identified and the area prepped in the usual manner. Skin & deeper tissues  infiltrated with local anesthetic. Appropriate amount of time allowed to pass for local anesthetics to take effect. The procedure needles were then advanced to the target area. Proper needle placement secured. Negative aspiration confirmed. Solution injected in intermittent fashion, asking for systemic symptoms every 0.5cc of injectate. The needles were then removed and the area cleansed, making sure to leave some of the prepping solution back to take advantage of its long term bactericidal properties.  Vitals:   02/26/21 1342  BP: 108/65  Pulse: 72  Resp: 16  Temp: (!) 97.3 F (36.3 C)  SpO2: 100%  Weight: 140 lb (63.5 kg)  Height: 5' (1.524 m)    Start Time: 1438 hrs. End Time: 1439 hrs. Materials:  Needle(s) Type: Epidural needle Gauge: 20G Length: 3.5-in Medication(s): Please see orders for medications and dosing details.  Imaging Guidance:          Type of Imaging Technique: None used Indication(s): N/A Exposure Time: No patient exposure Contrast: None used. Fluoroscopic Guidance: N/A Ultrasound Guidance: N/A Interpretation: N/A  Antibiotic Prophylaxis:   Anti-infectives (From admission, onward)   None     Indication(s): None identified  Post-operative Assessment:  Post-procedure Vital Signs:  Pulse/HCG Rate: 72  Temp: (!) 97.3 F (36.3 C)  Resp: 16 BP: 108/65 SpO2: 100 %  EBL: None  Complications: No immediate post-treatment complications observed by team, or reported by patient.  Note: The patient tolerated the entire procedure well. A repeat set of vitals were taken after the procedure and the patient was kept under observation following institutional policy, for this type of procedure. Post-procedural neurological assessment was performed, showing return to baseline, prior to discharge. The patient was provided with post-procedure discharge instructions, including a section on how to identify potential problems. Should any problems arise concerning this  procedure, the patient was given instructions to immediately contact us, at any time, without hesitation. In any case, we plan to contact the patient by telephone for a follow-up status report regarding this interventional procedure.  Comments:  No additional relevant information.  Plan of Care  Orders:  Orders Placed This Encounter  Procedures  . Radiofrequency,Lumbar    Standing Status:   Future    Standing Expiration Date:   02/26/2022    Scheduling Instructions:     Side(s): Left-sided     Level: L3-4, L4-5, & L5-S1 Facets (L2, L3, L4, L5, & S1 Medial Branch Nerves)     Sedation: With Sedation.     Scheduling Timeframe: As soon as pre-approved    Order Specific Question:   Where will this procedure be performed?    Answer:   [facility]  . TRIGGER POINT INJECTION    Scheduling Instructions:     Area: Lower Back     Side: Right     Sedation: No Sedation.     Timeframe: Today    Order Specific Question:   Where will this procedure be performed?    Answer:   [facility] Management  . Informed Consent Details: Physician/Practitioner Attestation; Transcribe to consent form and obtain patient signature    Provider Attestation: I, Karen Dossie Arbour, Edwards, (Pain Management Specialist), the physician/practitioner, attest that I have discussed with the patient the benefits, risks, side effects, alternatives, likelihood of achieving goals and potential problems during recovery for the procedure that I have provided informed consent.    Scheduling Instructions:     Note: Always confirm laterality of pain with Karen Edwards, before procedure.     Transcribe to consent form and obtain patient signature.    Order Specific Question:   Physician/Practitioner attestation of informed consent for procedure/surgical case    Answer:   I, the physician/practitioner, attest that I have discussed with the patient the benefits, risks, side effects, alternatives, likelihood of achieving goals and  potential problems during recovery for the procedure that I have provided informed consent.    Order Specific Question:   Procedure    Answer:   Myoneural Block (Trigger Point injection)    Order Specific Question:   Physician/Practitioner performing the procedure    Answer:   Karen Roh A. Dossie Arbour Edwards    Order Specific Question:   Indication/Reason    Answer:   Musculoskeletal pain/myofascial pain secondary to trigger point   Chronic Opioid Analgesic:  Oxycodone IR 5 mg, 1 tab PO BID (10 mg/day of oxycodone) MME/day:15mg /day.   Medications ordered for procedure: Meds ordered this encounter  Medications  . ropivacaine (PF) 2 mg/mL (0.2%) (NAROPIN) injection 4 mL  . triamcinolone acetonide (KENALOG-40) injection 40 mg   Medications administered: We administered ropivacaine (PF) 2 mg/mL (0.2%) and triamcinolone acetonide.  See the medical record for exact dosing, route, and time of administration.  Follow-up plan:   Return for RFA (48min): (L)  L-FCT RFA #2.       Interventional Therapies  Risk  Complexity Considerations:   WNL   Planned  Pending:   Therapeutic right lumbar facet RFA #2 (02/01/2021)    Under consideration:   Possible bilateral SI joint RFA #1 Diagnostic bilateral IA hip joint injection #1 Diagnostic left superior cluneal NB #1  Diagnostic bilateral femoral + obturator NB Diagnostic right L1-2LESI Diagnostic bilateralL4TFESI Diagnostic bilateral IA shoulder joint injection Diagnostic bilateral suprascapular NB   Completed:   Therapeutic right cervical ESI x1 (08/24/2020) Diagnostic bilateral GONB x1(08/19/2019) Palliative right cervical facet MBB x3(07/23/2018)  Palliative left cervical facet MBB x4(04/01/2019)  Palliative right cervical facet RFA x1 (09/17/2018) Palliative left cervical facet RFA x1 (05/04/2019) Palliative right L2-3 interlaminar LESI x2  Palliative right L1 TFESI x1 Bilateral lumbar spinal cord stimulator trial (done)  (05/14/2018) (by me)  Bilateral lumbar spinal cord stimulator implant (09/28/2019) (by me)  Bilateral spinal cord stimulator revision (03/14/2020) (by me)  Diagnostic bilateral lumbar facet MBB x3  Diagnostic bilateral SI joint block x2  Palliative left lumbar facet RFA x1 (12/28/2019)  Therapeutic right lumbar facet RFA x2 (02/01/2021)    Therapeutic  Palliative (PRN) options:   Therapeutic right cervical ESI #2  Diagnostic bilateral GONB #2 Palliative bilateral cervical facet blocks #5 Palliative right cervical facet RFA #2  Palliative left cervical facet RFA #2  Palliative right L2-3 interlaminar LESI #3  Palliative right L1 TFESI #2 Diagnostic bilateral lumbar facet block #4  Diagnostic bilateral SI joint block #3  Palliative left lumbar facet RFA #2  Palliative right lumbar facet RFA #3     Recent Visits Date Type Provider Dept  02/01/21 Procedure visit Milinda Pointer, Edwards [facility]  01/15/21 Office Visit Milinda Pointer, Edwards [facility]  Showing recent visits within past 90 days and meeting all other requirements Today's Visits Date Type Provider Dept  02/26/21 Office Visit Milinda Pointer, Edwards [facility]  Showing today's visits and meeting all other requirements Future Appointments Date Type Provider Dept  04/09/21 Appointment Milinda Pointer, Edwards [facility]  Showing future appointments within next 90 days and meeting all other requirements  Disposition: Discharge home  Discharge (Date  Time): 02/26/2021; 1441 hrs.   Primary Care Physician: Pleas Koch, NP Location: [facility] Note by: Gaspar Cola, Edwards Date: 02/26/2021; Time: 4:52 PM  Disclaimer:  Medicine is not an Chief Strategy Officer. The only guarantee in medicine is that nothing is guaranteed. It is important to note that the decision to proceed with this intervention was based on the information collected from the patient. The  Data and conclusions were drawn from the patient's questionnaire, the interview, and the physical examination. Because the information was provided in large part by the patient, it cannot be guaranteed that it has not been purposely or unconsciously manipulated. Every effort has been made to obtain as much relevant data as possible for this evaluation. It is important to note that the conclusions that lead to this procedure are derived in large part from the available data. Always take into account that the treatment will also be dependent on availability of resources and existing treatment guidelines, considered by other Pain Management Practitioners as being common knowledge and practice, at the time of the intervention. For Medico-Legal purposes, it is also important to point out that variation in procedural techniques and pharmacological choices are the acceptable norm. The indications, contraindications, technique, and results of the above procedure should only  be interpreted and judged by a Board-Certified Interventional Pain Specialist with extensive familiarity and expertise in the same exact procedure and technique.

## 2021-02-26 ENCOUNTER — Other Ambulatory Visit: Payer: Self-pay

## 2021-02-26 ENCOUNTER — Ambulatory Visit: Payer: Medicare PPO | Attending: Pain Medicine | Admitting: Pain Medicine

## 2021-02-26 ENCOUNTER — Encounter: Payer: Self-pay | Admitting: Pain Medicine

## 2021-02-26 VITALS — BP 108/65 | HR 72 | Temp 97.3°F | Resp 16 | Ht 60.0 in | Wt 140.0 lb

## 2021-02-26 DIAGNOSIS — M549 Dorsalgia, unspecified: Secondary | ICD-10-CM | POA: Insufficient documentation

## 2021-02-26 DIAGNOSIS — G8929 Other chronic pain: Secondary | ICD-10-CM | POA: Diagnosis not present

## 2021-02-26 DIAGNOSIS — M7918 Myalgia, other site: Secondary | ICD-10-CM | POA: Diagnosis not present

## 2021-02-26 DIAGNOSIS — M47816 Spondylosis without myelopathy or radiculopathy, lumbar region: Secondary | ICD-10-CM | POA: Insufficient documentation

## 2021-02-26 DIAGNOSIS — M545 Low back pain, unspecified: Secondary | ICD-10-CM | POA: Insufficient documentation

## 2021-02-26 MED ORDER — TRIAMCINOLONE ACETONIDE 40 MG/ML IJ SUSP
40.0000 mg | Freq: Once | INTRAMUSCULAR | Status: AC
  Administered 2021-02-26: 40 mg
  Filled 2021-02-26: qty 1

## 2021-02-26 MED ORDER — ROPIVACAINE HCL 2 MG/ML IJ SOLN
4.0000 mL | Freq: Once | INTRAMUSCULAR | Status: AC
  Administered 2021-02-26: 4 mL
  Filled 2021-02-26: qty 10

## 2021-02-26 NOTE — Patient Instructions (Signed)
____________________________________________________________________________________________  Post-Procedure Discharge Instructions  Instructions:  Apply ice:   Purpose: This will minimize any swelling and discomfort after procedure.   When: Day of procedure, as soon as you get home.  How: Fill a plastic sandwich bag with crushed ice. Cover it with a small towel and apply to injection site.  How long: (15 min on, 15 min off) Apply for 15 minutes then remove x 15 minutes.  Repeat sequence on day of procedure, until you go to bed.  Apply heat:   Purpose: To treat any soreness and discomfort from the procedure.  When: Starting the next day after the procedure.  How: Apply heat to procedure site starting the day following the procedure.  How long: May continue to repeat daily, until discomfort goes away.  Food intake: Start with clear liquids (like water) and advance to regular food, as tolerated.   Physical activities: Keep activities to a minimum for the first 8 hours after the procedure. After that, then as tolerated.  Driving: If you have received any sedation, be responsible and do not drive. You are not allowed to drive for 24 hours after having sedation.  Blood thinner: (Applies only to those taking blood thinners) You may restart your blood thinner 6 hours after your procedure.  Insulin: (Applies only to Diabetic patients taking insulin) As soon as you can eat, you may resume your normal dosing schedule.  Infection prevention: Keep procedure site clean and dry. Shower daily and clean area with soap and water.  Post-procedure Pain Diary: Extremely important that this be done correctly and accurately. Recorded information will be used to determine the next step in treatment. For the purpose of accuracy, follow these rules:  Evaluate only the area treated. Do not report or include pain from an untreated area. For the purpose of this evaluation, ignore all other areas of pain,  except for the treated area.  After your procedure, avoid taking a long nap and attempting to complete the pain diary after you wake up. Instead, set your alarm clock to go off every hour, on the hour, for the initial 8 hours after the procedure. Document the duration of the numbing medicine, and the relief you are getting from it.  Do not go to sleep and attempt to complete it later. It will not be accurate. If you received sedation, it is likely that you were given a medication that may cause amnesia. Because of this, completing the diary at a later time may cause the information to be inaccurate. This information is needed to plan your care.  Follow-up appointment: Keep your post-procedure follow-up evaluation appointment after the procedure (usually 2 weeks for most procedures, 6 weeks for radiofrequencies). DO NOT FORGET to bring you pain diary with you.   Expect: (What should I expect to see with my procedure?)  From numbing medicine (AKA: Local Anesthetics): Numbness or decrease in pain. You may also experience some weakness, which if present, could last for the duration of the local anesthetic.  Onset: Full effect within 15 minutes of injected.  Duration: It will depend on the type of local anesthetic used. On the average, 1 to 8 hours.   From steroids (Applies only if steroids were used): Decrease in swelling or inflammation. Once inflammation is improved, relief of the pain will follow.  Onset of benefits: Depends on the amount of swelling present. The more swelling, the longer it will take for the benefits to be seen. In some cases, up to 10 days.    Duration: Steroids will stay in the system x 2 weeks. Duration of benefits will depend on multiple posibilities including persistent irritating factors.  Side-effects: If present, they may typically last 2 weeks (the duration of the steroids).  Frequent: Cramps (if they occur, drink Gatorade and take over-the-counter Magnesium 450-500 mg  once to twice a day); water retention with temporary weight gain; increases in blood sugar; decreased immune system response; increased appetite.  Occasional: Facial flushing (red, warm cheeks); mood swings; menstrual changes.  Uncommon: Long-term decrease or suppression of natural hormones; bone thinning. (These are more common with higher doses or more frequent use. This is why we prefer that our patients avoid having any injection therapies in other practices.)   Very Rare: Severe mood changes; psychosis; aseptic necrosis.  From procedure: Some discomfort is to be expected once the numbing medicine wears off. This should be minimal if ice and heat are applied as instructed.  Call if: (When should I call?)  You experience numbness and weakness that gets worse with time, as opposed to wearing off.  New onset bowel or bladder incontinence. (Applies only to procedures done in the spine)  Emergency Numbers:  Durning business hours (Monday - Thursday, 8:00 AM - 4:00 PM) (Friday, 9:00 AM - 12:00 Noon): [phone]  After hours: [phone]  NOTE: If you are having a problem and are unable connect with, or to talk to a provider, then go to your nearest urgent care or emergency department. If the problem is serious and urgent, please call 911. ____________________________________________________________________________________________   ____________________________________________________________________________________________  Preparing for Procedure with Sedation  Procedure appointments are limited to planned procedures: . No Prescription Refills. . No disability issues will be discussed. . No medication changes will be discussed.  Instructions: . Oral Intake: Do not eat or drink anything for at least 8 hours prior to your procedure. (Exception: Blood Pressure Medication. See below.) . Transportation: Unless otherwise stated by your physician, you may drive yourself after the  procedure. . Blood Pressure Medicine: Do not forget to take your blood pressure medicine with a sip of water the morning of the procedure. If your Diastolic (lower reading)is above 100 mmHg, elective cases will be cancelled/rescheduled. . Blood thinners: These will need to be stopped for procedures. Notify our staff if you are taking any blood thinners. Depending on which one you take, there will be specific instructions on how and when to stop it. . Diabetics on insulin: Notify the staff so that you can be scheduled 1st case in the morning. If your diabetes requires high dose insulin, take only  of your normal insulin dose the morning of the procedure and notify the staff that you have done so. . Preventing infections: Shower with an antibacterial soap the morning of your procedure. . Build-up your immune system: Take 1000 mg of Vitamin C with every meal (3 times a day) the day prior to your procedure. . Antibiotics: Inform the staff if you have a condition or reason that requires you to take antibiotics before dental procedures. . Pregnancy: If you are pregnant, call and cancel the procedure. . Sickness: If you have a cold, fever, or any active infections, call and cancel the procedure. . Arrival: You must be in the facility at least 30 minutes prior to your scheduled procedure. . Children: Do not bring children with you. . Dress appropriately: Bring dark clothing that you would not mind if they get stained. . Valuables: Do not bring any jewelry or valuables.    Reasons to call and reschedule or cancel your procedure: (Following these recommendations will minimize the risk of a serious complication.) . Surgeries: Avoid having procedures within 2 weeks of any surgery. (Avoid for 2 weeks before or after any surgery). . Flu Shots: Avoid having procedures within 2 weeks of a flu shots or . (Avoid for 2 weeks before or after immunizations). . Barium: Avoid having a procedure within 7-10 days after  having had a radiological study involving the use of radiological contrast. (Myelograms, Barium swallow or enema study). . Heart attacks: Avoid any elective procedures or surgeries for the initial 6 months after a "Myocardial Infarction" (Heart Attack). . Blood thinners: It is imperative that you stop these medications before procedures. Let us know if you if you take any blood thinner.  . Infection: Avoid procedures during or within two weeks of an infection (including chest colds or gastrointestinal problems). Symptoms associated with infections include: Localized redness, fever, chills, night sweats or profuse sweating, burning sensation when voiding, cough, congestion, stuffiness, runny nose, sore throat, diarrhea, nausea, vomiting, cold or Flu symptoms, recent or current infections. It is specially important if the infection is over the area that we intend to treat. . Heart and lung problems: Symptoms that may suggest an active cardiopulmonary problem include: cough, chest pain, breathing difficulties or shortness of breath, dizziness, ankle swelling, uncontrolled high or unusually low blood pressure, and/or palpitations. If you are experiencing any of these symptoms, cancel your procedure and contact your primary care physician for an evaluation.  Remember:  Regular Business hours are:  Monday to Thursday 8:00 AM to 4:00 PM  Provider's Schedule: Arelene Moroni, MD:  Procedure days: Tuesday and Thursday 7:30 AM to 4:00 PM  Bilal Lateef, MD:  Procedure days: Monday and Wednesday 7:30 AM to 4:00 PM ____________________________________________________________________________________________    

## 2021-02-26 NOTE — Progress Notes (Signed)
Safety precautions to be maintained throughout the outpatient stay will include: orient to surroundings, keep bed in low position, maintain call bell within reach at all times, provide assistance with transfer out of bed and ambulation.  

## 2021-03-13 ENCOUNTER — Encounter: Payer: Medicare PPO | Admitting: Primary Care

## 2021-03-21 ENCOUNTER — Telehealth: Payer: Self-pay | Admitting: Primary Care

## 2021-03-21 NOTE — Telephone Encounter (Signed)
Error

## 2021-03-22 ENCOUNTER — Encounter: Payer: Medicare PPO | Admitting: Primary Care

## 2021-03-26 NOTE — Progress Notes (Addendum)
PROVIDER NOTE: Information contained herein reflects review and annotations entered in association with encounter. Interpretation of such information and data should be left to medically-trained personnel. Information provided to patient can be located elsewhere in the medical record under "Patient Instructions". Document created using STT-dictation technology, any transcriptional errors that may result from process are unintentional.    Patient: Karen Edwards  Service Category: Procedure  Provider: Gaspar Cola, MD  DOB: 11/23/1965  DOS: 03/27/2021  Location: [facility]  MRN: [id number]  Setting: Ambulatory - outpatient  Referring Provider: Pleas Koch, NP  Type: Established Patient  Specialty: Interventional Pain Management  PCP: Pleas Koch, NP   Primary Reason for Visit: Interventional Pain Management Treatment. CC: Back Pain (low)  Procedure:          Anesthesia, Analgesia, Anxiolysis:  Type: Thermal Lumbar Facet, Medial Branch Radiofrequency Ablation/Neurotomy  #2  Primary Purpose: Therapeutic Region: Posterolateral Lumbosacral Spine Level: L2, L3, L4, L5, & S1 Medial Branch Level(s). These levels will denervate the L3-4, L4-5, and the L5-S1 lumbar facet joints. Laterality: Left  Type: Moderate (Conscious) Sedation combined with Local Anesthesia Indication(s): Analgesia and Anxiety Route: Intravenous (IV) IV Access: Secured Sedation: Meaningful verbal contact was maintained at all times during the procedure  Local Anesthetic: Lidocaine 1-2%  Position: Prone   Indications: 1. 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 Lumbar facet hypertrophy (Bilateral)   3. Spondylosis without myelopathy or radiculopathy, lumbosacral region   4. DDD (degenerative disc disease), lumbosacral   5. Chronic low back pain (Bilateral) w/o sciatica   6. Chronic lower extremity pain (2ry area of Pain) (Bilateral) (R>L)   7. Presence of neurostimulator  (Thoracolumbar)   8. Pain due to any device, implant or graft (SCS battery) (Left PSIS area)    Karen Edwards has been dealing with the above chronic pain for longer than three months and has either failed to respond, was unable to tolerate, or simply did not get enough benefit from other more conservative therapies including, but not limited to: 1. Over-the-counter medications 2. Anti-inflammatory medications 3. Muscle relaxants 4. Membrane stabilizers 5. Opioids 6. Physical therapy and/or chiropractic manipulation 7. Modalities (Heat, ice, etc.) 8. Invasive techniques such as nerve blocks. Karen Edwards has attained more than 50% relief of the pain from a series of diagnostic injections conducted in separate occasions.  Pain Score: Pre-procedure: 8 /10 Post-procedure: 3 /10  Note: X-rays of the hips and SI joint were negative for any radiological evidence of arthropathy suggesting persistent right low back pain to be either myofascial or associated with her L1-2 right-sided disc extrusion.  Last lumbar MRI done on 2019.  Today I will be ordering a repeat MRI to evaluate the recurrence of this low back pain and hip pain.  Pre-op H&P Assessment:  Karen Edwards is a 55 y.o. (year old), female patient, seen today for interventional treatment. She  has a past surgical history that includes Cesarean section; Ankle surgery (Right, 03/02/2014); Hemorrhoid surgery (N/A, 06/16/2015); Cervical spine surgery (02/21/2016); Cesarean section with bilateral tubal ligation (1990); Colonoscopy (04/19/2015); Hysteroscopy with D & C (12/24/2013); Colonoscopy with propofol (N/A, 05/18/2018); Esophagogastroduodenoscopy (egd) with propofol (N/A, 05/18/2018); Spinal cord stimulator insertion (N/A, 06/18/2018); Tubal ligation; and Lumbar spinal cord simulator revision (N/A, 03/14/2020). Karen Edwards has a current medication list which includes the following prescription(s): acetaminophen, biotin, fluoxetine,  hydrocodone-acetaminophen, [START ON 04/03/2021] hydrocodone-acetaminophen, methocarbamol, fish oil, ondansetron, oxycodone, scopolamine, trazodone, turmeric, lidocaine, and naloxegol oxalate, and the following Facility-Administered Medications:  midazolam. Her primarily concern today is the Back Pain (low)  Initial Vital Signs:  Pulse/HCG Rate: 90ECG Heart Rate: (!) 56 Temp: 98.1 F (36.7 C) Resp: 18 BP: 117/68 SpO2: 99 %  BMI: Estimated body mass index is 28.32 kg/m as calculated from the following:   Height as of this encounter: 5' (1.524 m).   Weight as of this encounter: 145 lb (65.8 kg).  Risk Assessment: Allergies: Reviewed. She has No Known Allergies.  Allergy Precautions: None required Coagulopathies: Reviewed. None identified.  Blood-thinner therapy: None at this time Active Infection(s): Reviewed. None identified. Karen Edwards is afebrile  Site Confirmation: Karen Edwards was asked to confirm the procedure and laterality before marking the site Procedure checklist: Completed Consent: Before the procedure and under the influence of no sedative(s), amnesic(s), or anxiolytics, the patient was informed of the treatment options, risks and possible complications. To fulfill our ethical and legal obligations, as recommended by the American Medical Association's Code of Ethics, I have informed the patient of my clinical impression; the nature and purpose of the treatment or procedure; the risks, benefits, and possible complications of the intervention; the alternatives, including doing nothing; the risk(s) and benefit(s) of the alternative treatment(s) or procedure(s); and the risk(s) and benefit(s) of doing nothing. The patient was provided information about the general risks and possible complications associated with the procedure. These may include, but are not limited to: failure to achieve desired goals, infection, bleeding, organ or nerve damage, allergic reactions, paralysis, and  death. In addition, the patient was informed of those risks and complications associated to Spine-related procedures, such as failure to decrease pain; infection (i.e.: Meningitis, epidural or intraspinal abscess); bleeding (i.e.: epidural hematoma, subarachnoid hemorrhage, or any other type of intraspinal or peri-dural bleeding); organ or nerve damage (i.e.: Any type of peripheral nerve, nerve root, or spinal cord injury) with subsequent damage to sensory, motor, and/or autonomic systems, resulting in permanent pain, numbness, and/or weakness of one or several areas of the body; allergic reactions; (i.e.: anaphylactic reaction); and/or death. Furthermore, the patient was informed of those risks and complications associated with the medications. These include, but are not limited to: allergic reactions (i.e.: anaphylactic or anaphylactoid reaction(s)); adrenal axis suppression; blood sugar elevation that in diabetics may result in ketoacidosis or comma; water retention that in patients with history of congestive heart failure may result in shortness of breath, pulmonary edema, and decompensation with resultant heart failure; weight gain; swelling or edema; medication-induced neural toxicity; particulate matter embolism and blood vessel occlusion with resultant organ, and/or nervous system infarction; and/or aseptic necrosis of one or more joints. Finally, the patient was informed that Medicine is not an exact science; therefore, there is also the possibility of unforeseen or unpredictable risks and/or possible complications that may result in a catastrophic outcome. The patient indicated having understood very clearly. We have given the patient no guarantees and we have made no promises. Enough time was given to the patient to ask questions, all of which were answered to the patient's satisfaction. Ms. Sarinana has indicated that she wanted to continue with the procedure. Attestation: I, the ordering provider,  attest that I have discussed with the patient the benefits, risks, side-effects, alternatives, likelihood of achieving goals, and potential problems during recovery for the procedure that I have provided informed consent. Date  Time: 03/27/2021  9:59 AM  Pre-Procedure Preparation:  Monitoring: As per clinic protocol. Respiration, ETCO2, SpO2, BP, heart rate and rhythm monitor placed and checked for adequate function Safety Precautions:  Patient was assessed for positional comfort and pressure points before starting the procedure. Time-out: I initiated and conducted the "Time-out" before starting the procedure, as per protocol. The patient was asked to participate by confirming the accuracy of the "Time Out" information. Verification of the correct person, site, and procedure were performed and confirmed by me, the nursing staff, and the patient. "Time-out" conducted as per Joint Commission's Universal Protocol (UP.01.01.01). Time: 1135  Description of Procedure:          Laterality: Left Levels:  L2, L3, L4, L5, & S1 Medial Branch Level(s), at the L3-4, L4-5, and the L5-S1 lumbar facet joints. Area Prepped: Lumbosacral DuraPrep (Iodine Povacrylex [0.7% available iodine] and Isopropyl Alcohol, 74% w/w) Safety Precautions: Aspiration looking for blood return was conducted prior to all injections. At no point did we inject any substances, as a needle was being advanced. Before injecting, the patient was told to immediately notify me if she was experiencing any new onset of "ringing in the ears, or metallic taste in the mouth". No attempts were made at seeking any paresthesias. Safe injection practices and needle disposal techniques used. Medications properly checked for expiration dates. SDV (single dose vial) medications used. After the completion of the procedure, all disposable equipment used was discarded in the proper designated medical waste containers. Local Anesthesia: Protocol guidelines were  followed. The patient was positioned over the fluoroscopy table. The area was prepped in the usual manner. The time-out was completed. The target area was identified using fluoroscopy. A 12-in long, straight, sterile hemostat was used with fluoroscopic guidance to locate the targets for each level blocked. Once located, the skin was marked with an approved surgical skin marker. Once all sites were marked, the skin (epidermis, dermis, and hypodermis), as well as deeper tissues (fat, connective tissue and muscle) were infiltrated with a small amount of a short-acting local anesthetic, loaded on a 10cc syringe with a 25G, 1.5-in  Needle. An appropriate amount of time was allowed for local anesthetics to take effect before proceeding to the next step. Local Anesthetic: Lidocaine 2.0% The unused portion of the local anesthetic was discarded in the proper designated containers. Technical explanation of process:  Radiofrequency Ablation (RFA) L2 Medial Branch Nerve RFA: The target area for the L2 medial branch is at the junction of the postero-lateral aspect of the superior articular process and the superior, posterior, and medial edge of the transverse process of L3. Under fluoroscopic guidance, a Radiofrequency needle was inserted until contact was made with os over the superior postero-lateral aspect of the pedicular shadow (target area). Sensory and motor testing was conducted to properly adjust the position of the needle. Once satisfactory placement of the needle was achieved, the numbing solution was slowly injected after negative aspiration for blood. 2.0 mL of the nerve block solution was injected without difficulty or complication. After waiting for at least 3 minutes, the ablation was performed. Once completed, the needle was removed intact. L3 Medial Branch Nerve RFA: The target area for the L3 medial branch is at the junction of the postero-lateral aspect of the superior articular process and the superior,  posterior, and medial edge of the transverse process of L4. Under fluoroscopic guidance, a Radiofrequency needle was inserted until contact was made with os over the superior postero-lateral aspect of the pedicular shadow (target area). Sensory and motor testing was conducted to properly adjust the position of the needle. Once satisfactory placement of the needle was achieved, the numbing solution was slowly injected after  negative aspiration for blood. 2.0 mL of the nerve block solution was injected without difficulty or complication. After waiting for at least 3 minutes, the ablation was performed. Once completed, the needle was removed intact. L4 Medial Branch Nerve RFA: The target area for the L4 medial branch is at the junction of the postero-lateral aspect of the superior articular process and the superior, posterior, and medial edge of the transverse process of L5. Under fluoroscopic guidance, a Radiofrequency needle was inserted until contact was made with os over the superior postero-lateral aspect of the pedicular shadow (target area). Sensory and motor testing was conducted to properly adjust the position of the needle. Once satisfactory placement of the needle was achieved, the numbing solution was slowly injected after negative aspiration for blood. 2.0 mL of the nerve block solution was injected without difficulty or complication. After waiting for at least 3 minutes, the ablation was performed. Once completed, the needle was removed intact. L5 Medial Branch Nerve RFA: The target area for the L5 medial branch is at the junction of the postero-lateral aspect of the superior articular process of S1 and the superior, posterior, and medial edge of the sacral ala. Under fluoroscopic guidance, a Radiofrequency needle was inserted until contact was made with os over the superior postero-lateral aspect of the pedicular shadow (target area). Sensory and motor testing was conducted to properly adjust the  position of the needle. Once satisfactory placement of the needle was achieved, the numbing solution was slowly injected after negative aspiration for blood. 2.0 mL of the nerve block solution was injected without difficulty or complication. After waiting for at least 3 minutes, the ablation was performed. Once completed, the needle was removed intact. S1 Medial Branch Nerve RFA: The target area for the S1 medial branch is located inferior to the junction of the S1 superior articular process and the L5 inferior articular process, posterior, inferior, and lateral to the 6 o'clock position of the L5-S1 facet joint, just superior to the S1 posterior foramen. Under fluoroscopic guidance, the Radiofrequency needle was advanced until contact was made with os over the Target area. Sensory and motor testing was conducted to properly adjust the position of the needle. Once satisfactory placement of the needle was achieved, the numbing solution was slowly injected after negative aspiration for blood. 2.0 mL of the nerve block solution was injected without difficulty or complication. After waiting for at least 3 minutes, the ablation was performed. Once completed, the needle was removed intact. Radiofrequency lesioning (ablation):  Radiofrequency Generator: NeuroTherm [id number] Sensory Stimulation Parameters: 50 Hz was used to locate & identify the nerve, making sure that the needle was positioned such that there was no sensory stimulation below 0.3 V or above 0.7 V. Motor Stimulation Parameters: 2 Hz was used to evaluate the motor component. Care was taken not to lesion any nerves that demonstrated motor stimulation of the lower extremities at an output of less than 2.5 times that of the sensory threshold, or a maximum of 2.0 V. Lesioning Technique Parameters: Standard Radiofrequency settings. (Not bipolar or pulsed.) Temperature Settings: 80 degrees C Lesioning time: 60 seconds Intra-operative Compliance:  Compliant Materials & Medications: Needle(s) (Electrode/Cannula) Type: Teflon-coated, curved tip, Radiofrequency needle(s) Gauge: 22G Length: 10cm Numbing solution: 0.2% PF-Ropivacaine + Triamcinolone (40 mg/mL) diluted to a final concentration of 4 mg of Triamcinolone/mL of Ropivacaine The unused portion of the solution was discarded in the proper designated containers.  Once the entire procedure was completed, the treated area was cleaned, making sure  to leave some of the prepping solution back to take advantage of its long term bactericidal properties.    Illustration of the posterior view of the lumbar spine and the posterior neural structures. Laminae of L2 through S1 are labeled. DPRL5, dorsal primary ramus of L5; DPRS1, dorsal primary ramus of S1; DPR3, dorsal primary ramus of L3; FJ, facet (zygapophyseal) joint L3-L4; I, inferior articular process of L4; LB1, lateral branch of dorsal primary ramus of L1; IAB, inferior articular branches from L3 medial branch (supplies L4-L5 facet joint); IBP, intermediate branch plexus; MB3, medial branch of dorsal primary ramus of L3; NR3, third lumbar nerve root; S, superior articular process of L5; SAB, superior articular branches from L4 (supplies L4-5 facet joint also); TP3, transverse process of L3.  Vitals:   03/27/21 1205 03/27/21 1214 03/27/21 1224 03/27/21 1234  BP: 118/70 (!) 114/59 131/75 129/68  Pulse: (!) 59     Resp: 15 16 16 16   Temp:  98 F (36.7 C)  98 F (36.7 C)  TempSrc:      SpO2: 96% 94% 96% 98%  Weight:      Height:       Start Time: 1135 hrs. End Time: 1203 hrs.  Imaging Guidance (Spinal):          Type of Imaging Technique: Fluoroscopy Guidance (Spinal) Indication(s): Assistance in needle guidance and placement for procedures requiring needle placement in or near specific anatomical locations not easily accessible without such assistance. Exposure Time: Please see nurses notes. Contrast: None used. Fluoroscopic  Guidance: I was personally present during the use of fluoroscopy. "Tunnel Vision Technique" used to obtain the best possible view of the target area. Parallax error corrected before commencing the procedure. "Direction-depth-direction" technique used to introduce the needle under continuous pulsed fluoroscopy. Once target was reached, antero-posterior, oblique, and lateral fluoroscopic projection used confirm needle placement in all planes. Images permanently stored in EMR. Interpretation: No contrast injected. I personally interpreted the imaging intraoperatively. Adequate needle placement confirmed in multiple planes. Permanent images saved into the patient's record.  Antibiotic Prophylaxis:   Anti-infectives (From admission, onward)   None     Indication(s): None identified  Post-operative Assessment:  Post-procedure Vital Signs:  Pulse/HCG Rate: (!) 59 (sb)61 Temp: 98 F (36.7 C) Resp: 16 BP: 129/68 SpO2: 98 %  EBL: None  Complications: No immediate post-treatment complications observed by team, or reported by patient.  Note: The patient tolerated the entire procedure well. A repeat set of vitals were taken after the procedure and the patient was kept under observation following institutional policy, for this type of procedure. Post-procedural neurological assessment was performed, showing return to baseline, prior to discharge. The patient was provided with post-procedure discharge instructions, including a section on how to identify potential problems. Should any problems arise concerning this procedure, the patient was given instructions to immediately contact us, at any time, without hesitation. In any case, we plan to contact the patient by telephone for a follow-up status report regarding this interventional procedure.  Comments:  No additional relevant information.  Plan of Care  Orders:  Orders Placed This Encounter  Procedures  . Radiofrequency,Lumbar    Scheduling  Instructions:     Side(s): Left-sided     Level: L3-4, L4-5, & L5-S1 Facets (L2, L3, L4, L5, & S1 Medial Branch Nerves)     Sedation: With Sedation.     Timeframe: Today    Order Specific Question:   Where will this procedure be performed?  Answer:   [facility]  . DG PAIN CLIN[facility] C-ARM 1-60 MIN NO REPORT    Intraoperative interpretation by procedural physician at [place].    Standing Status:   Standing    Number of Occurrences:   1    Order Specific Question:   Reason for exam:    Answer:   Assistance in needle guidance and placement for procedures requiring needle placement in or near specific anatomical locations not easily accessible without such assistance.  . MR LUMBAR SPINE WO CONTRAST    Patient presents with axial pain with possible radicular component.  In addition to any acute findings, please report on:  1. Facet (Zygapophyseal) joint DJD (Hypertrophy, space narrowing, subchondral sclerosis, and/or osteophyte formation) 2. DDD and/or IVDD (Loss of disc height, desiccation or "Black disc disease") 3. Pars defects 4. Spondylolisthesis, spondylosis, and/or spondyloarthropathies (include Degree/Grade of displacement in mm) 5. Vertebral body Fractures, including age (old, new/acute) 65. Modic Type Changes 7. Demineralization 8. Bone pathology 9. Central, Lateral Recess, and/or Foraminal Stenosis (include AP diameter of stenosis in mm) 10. Surgical changes (hardware type, status, and presence of fibrosis)  NOTE: Please specify level(s) and laterality.    Standing Status:   Future    Standing Expiration Date:   04/26/2021    Scheduling Instructions:     Imaging must be done as soon as possible. Inform patient that order will expire within 30 days and I will not renew it.    Order Specific Question:   What is the patient's sedation requirement?    Answer:   No Sedation    Order Specific Question:   Does the patient have a pacemaker or implanted devices?     Answer:   No    Order Specific Question:   Preferred imaging location?    Answer:   [facility] (table limit-350lbs)    Order Specific Question:   Call Results- Best Contact Number?    Answer:   [phone] ([facility])    Order Specific Question:   Radiology Contrast Protocol - do NOT remove file path    Answer:   [email or web address]  . Informed Consent Details: Physician/Practitioner Attestation; Transcribe to consent form and obtain patient signature    Nursing Order: Transcribe to consent form and obtain patient signature. Note: Always confirm laterality of pain with Ms. Tye Savoy, before procedure.    Order Specific Question:   Physician/Practitioner attestation of informed consent for procedure/surgical case    Answer:   I, the physician/practitioner, attest that I have discussed with the patient the benefits, risks, side effects, alternatives, likelihood of achieving goals and potential problems during recovery for the procedure that I have provided informed consent.    Order Specific Question:   Procedure    Answer:   Lumbar Facet Radiofrequency Ablation    Order Specific Question:   Physician/Practitioner performing the procedure    Answer:   Annabel Gibeau A. 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  . Provide equipment / supplies at bedside    "Radiofrequency Tray"; Large hemostat (x1); Small hemostat (x1); Towels (x8); 4x4 sterile sponge pack (x1) Needle type: Teflon-coated Radiofrequency Needle (Disposable  single use) Size: Regular Quantity: 5    Standing Status:  Standing    Number of Occurrences:   1    Order Specific Question:   Specify    Answer:    Radiofrequency Tray   Chronic Opioid Analgesic:  Oxycodone IR 5 mg, 1 tab PO QD (5 mg/day of oxycodone) PRN MME/day:7.5mg /day.   Medications ordered for procedure: Meds ordered this encounter  Medications  . lidocaine (XYLOCAINE) 2 % (with pres) injection 400 mg  . lactated ringers infusion 1,000 mL  . midazolam (VERSED) 5 MG/5ML injection 1-2 mg    Make sure Flumazenil is available in the pyxis when using this medication. If oversedation occurs, administer 0.2 mg IV over 15 sec. If after 45 sec no response, administer 0.2 mg again over 1 min; may repeat at 1 min intervals; not to exceed 4 doses (1 mg)  . fentaNYL (SUBLIMAZE) injection 25-50 mcg    Make sure Narcan is available in the pyxis when using this medication. In the event of respiratory depression (RR< 8/min): Titrate NARCAN (naloxone) in increments of 0.1 to 0.2 mg IV at 2-3 minute intervals, until desired degree of reversal.  . ropivacaine (PF) 2 mg/mL (0.2%) (NAROPIN) injection 9 mL  . triamcinolone acetonide (KENALOG-40) injection 40 mg  . HYDROcodone-acetaminophen (NORCO/VICODIN) 5-325 MG tablet    Sig: Take 1 tablet by mouth every 8 (eight) hours as needed for up to 7 days for severe pain. Must last 7 days.    Dispense:  21 tablet    Refill:  0    For acute post-operative pain. Not to be refilled. Must last 7 days.  Marland Kitchen HYDROcodone-acetaminophen (NORCO/VICODIN) 5-325 MG tablet    Sig: Take 1 tablet by mouth every 8 (eight) hours as needed for up to 7 days for severe pain. Must last for 7 days.    Dispense:  21 tablet    Refill:  0    For acute post-operative pain. Not to be refilled. Must last 7 days.   Medications administered: We administered lidocaine, lactated ringers, midazolam, fentaNYL, ropivacaine (PF) 2 mg/mL (0.2%), and triamcinolone acetonide.  See the medical record for exact dosing, route, and time of administration.  Follow-up plan:   Return in about 6 weeks (around 05/08/2021) for procedure day (afternoon  F2F) (PPE).       Interventional Therapies  Risk  Complexity Considerations:   WNL   Planned  Pending:   Therapeutic right lumbar facet RFA #2 (02/01/2021)    Under consideration:   Possible bilateral SI joint RFA #1 Diagnostic bilateral IA hip joint injection #1 Diagnostic left superior cluneal NB #1  Diagnostic bilateral femoral + obturator NB Diagnostic right L1-2LESI Diagnostic bilateralL4TFESI Diagnostic bilateral IA shoulder joint injection Diagnostic bilateral suprascapular NB   Completed:   Therapeutic right cervical ESI x1 (08/24/2020) Diagnostic bilateral GONB x1(08/19/2019) Palliative right cervical facet MBB x3(07/23/2018)  Palliative left cervical facet MBB x4(04/01/2019)  Palliative right cervical facet RFA x1 (09/17/2018) Palliative left cervical facet RFA x1 (05/04/2019) Palliative right L2-3 interlaminar LESI x2  Palliative right L1 TFESI x1 Bilateral lumbar spinal cord stimulator trial (done) (05/14/2018) (by me)  Bilateral lumbar spinal cord stimulator implant (09/28/2019) (by me)  Bilateral spinal cord stimulator revision (03/14/2020) (by me)  Diagnostic bilateral lumbar facet MBB x3  Diagnostic bilateral SI joint block x2  Palliative left lumbar facet RFA x1 (12/28/2019)  Therapeutic right lumbar facet RFA x2 (02/01/2021)    Therapeutic  Palliative (PRN) options:   Therapeutic right cervical ESI #2  Diagnostic bilateral GONB #2 Palliative bilateral cervical facet blocks #  5 Palliative right cervical facet RFA #2  Palliative left cervical facet RFA #2  Palliative right L2-3 interlaminar LESI #3  Palliative right L1 TFESI #2 Diagnostic bilateral lumbar facet block #4  Diagnostic bilateral SI joint block #3  Palliative left lumbar facet RFA #2  Palliative right lumbar facet RFA #3     Recent Visits Date Type Provider Dept  02/26/21 Office Visit Milinda Pointer, MD [facility]  02/01/21 Procedure visit Milinda Pointer, MD  [facility]  01/15/21 Office Visit Milinda Pointer, MD [facility]  Showing recent visits within past 90 days and meeting all other requirements Today's Visits Date Type Provider Dept  03/27/21 Procedure visit Milinda Pointer, MD [facility]  Showing today's visits and meeting all other requirements Future Appointments Date Type Provider Dept  04/09/21 Appointment Milinda Pointer, MD [facility]  05/09/21 Appointment Milinda Pointer, MD [facility]  Showing future appointments within next 90 days and meeting all other requirements  Disposition: Discharge home  Discharge (Date  Time): 03/27/2021; 1234 hrs.   Primary Care Physician: Pleas Koch, NP Location: [facility] Note by: Gaspar Cola, MD Date: 03/27/2021; Time: 4:29 PM  Disclaimer:  Medicine is not an Chief Strategy Officer. The only guarantee in medicine is that nothing is guaranteed. It is important to note that the decision to proceed with this intervention was based on the information collected from the patient. The Data and conclusions were drawn from the patient's questionnaire, the interview, and the physical examination. Because the information was provided in large part by the patient, it cannot be guaranteed that it has not been purposely or unconsciously manipulated. Every effort has been made to obtain as much relevant data as possible for this evaluation. It is important to note that the conclusions that lead to this procedure are derived in large part from the available data. Always take into account that the treatment will also be dependent on availability of resources and existing treatment guidelines, considered by other Pain Management Practitioners as being common knowledge and practice, at the time of the intervention. For Medico-Legal purposes, it is also important to point out that variation in procedural techniques and  pharmacological choices are the acceptable norm. The indications, contraindications, technique, and results of the above procedure should only be interpreted and judged by a Board-Certified Interventional Pain Specialist with extensive familiarity and expertise in the same exact procedure and technique.

## 2021-03-27 ENCOUNTER — Ambulatory Visit: Payer: Medicare PPO | Admitting: Primary Care

## 2021-03-27 ENCOUNTER — Encounter: Payer: Self-pay | Admitting: Pain Medicine

## 2021-03-27 ENCOUNTER — Other Ambulatory Visit: Payer: Self-pay

## 2021-03-27 ENCOUNTER — Ambulatory Visit
Admission: RE | Admit: 2021-03-27 | Discharge: 2021-03-27 | Disposition: A | Discharge status: Home / Self Care | Payer: Medicare PPO | Source: Ambulatory Visit | Attending: Pain Medicine | Admitting: Pain Medicine

## 2021-03-27 ENCOUNTER — Ambulatory Visit (HOSPITAL_BASED_OUTPATIENT_CLINIC_OR_DEPARTMENT_OTHER): Payer: Medicare PPO | Admitting: Pain Medicine

## 2021-03-27 VITALS — BP 129/68 | HR 59 | Temp 98.0°F | Resp 16 | Ht 60.0 in | Wt 145.0 lb

## 2021-03-27 DIAGNOSIS — M5136 Other intervertebral disc degeneration, lumbar region: Secondary | ICD-10-CM

## 2021-03-27 DIAGNOSIS — M545 Low back pain, unspecified: Secondary | ICD-10-CM

## 2021-03-27 DIAGNOSIS — M79604 Pain in right leg: Secondary | ICD-10-CM

## 2021-03-27 DIAGNOSIS — M79605 Pain in left leg: Secondary | ICD-10-CM | POA: Diagnosis not present

## 2021-03-27 DIAGNOSIS — G8918 Other acute postprocedural pain: Secondary | ICD-10-CM

## 2021-03-27 DIAGNOSIS — M47816 Spondylosis without myelopathy or radiculopathy, lumbar region: Secondary | ICD-10-CM | POA: Insufficient documentation

## 2021-03-27 DIAGNOSIS — M5137 Other intervertebral disc degeneration, lumbosacral region: Secondary | ICD-10-CM

## 2021-03-27 DIAGNOSIS — T85848S Pain due to other internal prosthetic devices, implants and grafts, sequela: Secondary | ICD-10-CM | POA: Diagnosis not present

## 2021-03-27 DIAGNOSIS — G8929 Other chronic pain: Secondary | ICD-10-CM | POA: Insufficient documentation

## 2021-03-27 DIAGNOSIS — M47817 Spondylosis without myelopathy or radiculopathy, lumbosacral region: Secondary | ICD-10-CM | POA: Insufficient documentation

## 2021-03-27 DIAGNOSIS — M5126 Other intervertebral disc displacement, lumbar region: Secondary | ICD-10-CM | POA: Diagnosis present

## 2021-03-27 DIAGNOSIS — Z9682 Presence of neurostimulator: Secondary | ICD-10-CM | POA: Diagnosis not present

## 2021-03-27 MED ORDER — HYDROCODONE-ACETAMINOPHEN 5-325 MG PO TABS: 1.0000 | ORAL_TABLET | Freq: Three times a day (TID) | ORAL | 0 refills | Status: AC | PRN

## 2021-03-27 MED ORDER — LIDOCAINE HCL 2 % IJ SOLN
INTRAMUSCULAR | Status: AC
  Filled 2021-03-27: qty 20

## 2021-03-27 MED ORDER — FENTANYL CITRATE (PF) 100 MCG/2ML IJ SOLN
25.0000 ug | INTRAMUSCULAR | Status: AC | PRN
Start: 2021-03-27 — End: 2021-03-27
  Administered 2021-03-27 (×2): 50 ug via INTRAVENOUS
  Filled 2021-03-27: qty 2

## 2021-03-27 MED ORDER — LACTATED RINGERS IV SOLN
1000.0000 mL | Freq: Once | INTRAVENOUS | Status: AC
  Administered 2021-03-27: 1000 mL via INTRAVENOUS

## 2021-03-27 MED ORDER — ROPIVACAINE HCL 2 MG/ML IJ SOLN
INTRAMUSCULAR | Status: AC
  Filled 2021-03-27: qty 20

## 2021-03-27 MED ORDER — LIDOCAINE HCL 2 % IJ SOLN
20.0000 mL | Freq: Once | INTRAMUSCULAR | Status: AC
  Administered 2021-03-27: 400 mg
  Filled 2021-03-27: qty 20

## 2021-03-27 MED ORDER — ROPIVACAINE HCL 2 MG/ML IJ SOLN
9.0000 mL | Freq: Once | INTRAMUSCULAR | Status: AC
  Administered 2021-03-27: 9 mL via PERINEURAL

## 2021-03-27 MED ORDER — MIDAZOLAM HCL 5 MG/5ML IJ SOLN
1.0000 mg | INTRAMUSCULAR | Status: DC | PRN
  Administered 2021-03-27 (×2): 1 mg via INTRAVENOUS
  Filled 2021-03-27: qty 5

## 2021-03-27 MED ORDER — TRIAMCINOLONE ACETONIDE 40 MG/ML IJ SUSP
40.0000 mg | Freq: Once | INTRAMUSCULAR | Status: AC
  Administered 2021-03-27: 40 mg
  Filled 2021-03-27: qty 1

## 2021-03-27 NOTE — Progress Notes (Signed)
Safety precautions to be maintained throughout the outpatient stay will include: orient to surroundings, keep bed in low position, maintain call bell within reach at all times, provide assistance with transfer out of bed and ambulation.  

## 2021-03-27 NOTE — Patient Instructions (Signed)

## 2021-03-28 ENCOUNTER — Telehealth: Payer: Self-pay

## 2021-03-28 NOTE — Telephone Encounter (Signed)
Denies any needs at this time. Instructed to call if needed. Patient was asking about MRI on shoulder, told her to discuss this with the doctor on her MM appt. Patient with understanding.

## 2021-04-03 ENCOUNTER — Ambulatory Visit: Payer: Medicare PPO | Attending: Pain Medicine | Admitting: Pain Medicine

## 2021-04-03 ENCOUNTER — Other Ambulatory Visit: Payer: Self-pay

## 2021-04-03 ENCOUNTER — Encounter: Payer: Self-pay | Admitting: Pain Medicine

## 2021-04-03 ENCOUNTER — Telehealth: Payer: Self-pay | Admitting: Pain Medicine

## 2021-04-03 ENCOUNTER — Telehealth: Payer: Self-pay | Admitting: *Deleted

## 2021-04-03 ENCOUNTER — Ambulatory Visit: Payer: Medicare PPO | Admitting: Primary Care

## 2021-04-03 VITALS — BP 131/71 | HR 69 | Temp 97.1°F | Ht 61.0 in | Wt 145.0 lb

## 2021-04-03 DIAGNOSIS — M545 Low back pain, unspecified: Secondary | ICD-10-CM | POA: Diagnosis not present

## 2021-04-03 DIAGNOSIS — G8929 Other chronic pain: Secondary | ICD-10-CM

## 2021-04-03 DIAGNOSIS — Z4542 Encounter for adjustment and management of neuropacemaker (brain) (peripheral nerve) (spinal cord): Secondary | ICD-10-CM | POA: Diagnosis not present

## 2021-04-03 DIAGNOSIS — T85192A Other mechanical complication of implanted electronic neurostimulator (electrode) of spinal cord, initial encounter: Secondary | ICD-10-CM | POA: Diagnosis not present

## 2021-04-03 MED ORDER — ORPHENADRINE CITRATE 30 MG/ML IJ SOLN
60.0000 mg | Freq: Once | INTRAMUSCULAR | Status: AC
  Administered 2021-04-03: 60 mg via INTRAMUSCULAR

## 2021-04-03 MED ORDER — ORPHENADRINE CITRATE 30 MG/ML IJ SOLN
INTRAMUSCULAR | Status: AC
  Filled 2021-04-03: qty 2

## 2021-04-03 MED ORDER — KETOROLAC TROMETHAMINE 60 MG/2ML IM SOLN
INTRAMUSCULAR | Status: AC
  Filled 2021-04-03: qty 2

## 2021-04-03 MED ORDER — KETOROLAC TROMETHAMINE 60 MG/2ML IM SOLN
60.0000 mg | Freq: Once | INTRAMUSCULAR | Status: AC
Start: 2021-04-03 — End: 2021-04-03
  Administered 2021-04-03 (×2): 60 mg via INTRAMUSCULAR

## 2021-04-03 NOTE — Telephone Encounter (Signed)
Patient called today re; a storm door falling on her and hitting her on her back on the left side where her stimulator is implanted.  This occurred on Sunday. The area is bruised and scratched and is having pain running down her left leg and both areas are sore to the touch.  Reports that stimulator is working the way she expects it to, however she is worried about the pain in her leg as the door did not hit that part of her body.  She is wondering what she should do re; stimulator and if this could have possibly been moved from the blow to the back.

## 2021-04-03 NOTE — Progress Notes (Signed)
Safety precautions to be maintained throughout the outpatient stay will include: orient to surroundings, keep bed in low position, maintain call bell within reach at all times, provide assistance with transfer out of bed and ambulation.  

## 2021-04-03 NOTE — Progress Notes (Signed)
PROVIDER NOTE: Information contained herein reflects review and annotations entered in association with encounter. Interpretation of such information and data should be left to medically-trained personnel. Information provided to patient can be located elsewhere in the medical record under "Patient Instructions". Document created using STT-dictation technology, any transcriptional errors that may result from process are unintentional.    Patient: Karen Edwards  Service Category: E/M  Provider: Gaspar Cola, MD  DOB: May 16, 1966  DOS: 04/03/2021  Specialty: Interventional Pain Management  MRN: [id number]  Setting: Ambulatory outpatient  PCP: Pleas Koch, NP  Type: Established Patient    Referring Provider: Pleas Koch, NP  Location: Office  Delivery: Face-to-face     HPI  [name], a 55 y.o. year old female, is here today because of her Acute exacerbation of chronic low back pain [M54.50, G89.29]. [name] primary complain today is Back Pain Last encounter: My last encounter with her was on 04/03/2021. Pertinent problems: [name] has Chronic low back pain (1ry area of Pain) (Bilateral) (R>L) w/ sciatica (Bilateral); DDD (degenerative disc disease), lumbar; DDD (degenerative disc disease), cervical; Chronic occipital neuralgia (5th area of Pain) (Bilateral) (R>L); Lumbar facet syndrome (Bilateral) (L>R); DDD (degenerative disc disease), lumbosacral; Lumbar radicular pain (Right) (L4); Sacroiliac joint dysfunction (Bilateral); Chronic neck pain (4th area of Pain) (Bilateral) (R>L); Numbness and tingling of upper extremity (Right); Chronic upper extremity pain (Bilateral) (R>L); Chronic sacroiliac joint pain (Bilateral) (L>R); Lumbar facet hypertrophy (Bilateral); L1-2 disc extrusion (Right); Cervical foraminal stenosis (C5-6) (Bilateral); Chronic pain syndrome; Chronic cervical radicular pain (Right: C6/C7) (Left: C5/T1); Chronic lumbar radicular pain; Chronic hip pain  (3ry area of Pain) (Bilateral) (R>L); Chronic lower extremity pain (2ry area of Pain) (Bilateral) (R>L); Chronic shoulder pain (Bilateral) (L>R); Chronic musculoskeletal pain; Lumbar spondylosis; Cervical facet syndrome (Bilateral) (R>L); Myofascial pain; Spondylosis without myelopathy or radiculopathy, cervical region; Cervicalgia; S/P insertion of Epidural Neurostimulator (SCS) (Bilateral); Chronic neck pain with history of cervical spinal surgery; 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[facility]); Trigger point with back pain (Right); Cervicogenic headache (Bilateral); Occipital headache (Bilateral); Chronic tension-type headache, intractable; Cervico-occipital neuralgia (Bilateral); Pain due to any device, implant or graft (SCS battery) (Left PSIS area); Spondylosis without myelopathy or radiculopathy, lumbosacral region; History of cervical spinal surgery (C5-7 ACDF); Acute postoperative pain; Cervical radiculopathy at C6 (Bilateral); Chronic low back pain (Bilateral) w/o sciatica; Presence of neurostimulator (Thoracolumbar); 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 Pain Assessment: Severity of Chronic pain is reported as a 8 /10. Location: Back Lower/pain radiaties down both leg to her mid thigh. Onset: More than a month ago. Quality: Aching,Burning,Constant,Sore,Throbbing,Shooting. Timing: Constant. Modifying factor(s): repositioning,. Vitals:  height is 5' 1"  (1.549 m) and weight is 145 lb (65.8 kg). Her temperature is 97.1 F (36.2 C) (abnormal). Her blood pressure is 131/71 and her pulse is 69. Her oxygen saturation is 100%.   Reason for encounter: worsening of previously known (established) problem.   The patient indicated that she was working at home exchanging a screen door with her husband when the door fell and happened to hit her on the left lower back exactly where of the spinal cord stimulator battery  implant is located.  She already had some pain in that area but this has flirted up more.  She comes in to have it checked.  Incidentally, during the visit it turns out that she has not been using the device for a while and admits  to having allowed the battery to completely discharge several times.  Today we attempted to take a readout of the spinal cord stimulator battery, but we were unable to do so since the antenna was unable to pick up on the device.  This means that it is completely discharged.  We called [employer] and they gave [place] a couple recommendations which we tried and again we were unable to connect.  Finally, they recommended that she go home and first recharge her antenna completely and then attempt at reconnecting with the device and recharge it all night long so that he can be evaluated.  If by any chance the device cannot pick up the charge, it may indicate that the battery has been damaged beyond repair and it may need to be exchanged.  This information was shared with the patient today.  She agreed to go home and charged the antenna and subsequently the battery in attempt to save it.  If by any chance the battery has to be changed, we will request that the surgeon change the location of the device since it has been causing a lot of discomfort to her.  Another alternative would be to go to the L2 and L3 medial branches where the origin of the superior cluneal nerve is located and do a diagnostic left L2 and L3 medial branch block.  If this provides patient with good relief of the pain for the duration of the local anesthetic, we can then come back and do a radiofrequency ablation of that area to see if we can provide her with longer lasting benefit.  Pharmacotherapy Assessment   Analgesic: Oxycodone IR 5 mg, 1 tab PO QD (5 mg/day of oxycodone) PRN MME/day:7.83m/day.   Monitoring: [place] PMP: PDMP not reviewed this encounter.       Pharmacotherapy: No side-effects or adverse reactions  reported. Compliance: No problems identified. Effectiveness: Clinically acceptable.  GIgnatius Specking RN  04/03/2021  4:40 PM  Sign when Signing Visit patjient is to go home and and charge remote and then SCS. [employer] rep will call patient on 04/04/21. Patjient not sure where the charger for her SCS is but will go home and look.  BChauncey Fischer RN  04/03/2021  3:10 PM  Sign when Signing Visit Safety precautions to be maintained throughout the outpatient stay will include: orient to surroundings, keep bed in low position, maintain call bell within reach at all times, provide assistance with transfer out of bed and ambulation.     UDS:  Summary  Date Value Ref Range Status  01/15/2021 Note  Final    Comment:    ==================================================================== [employer] Select 13 (MW) ==================================================================== Test                             Result       Flag       Units  Drug Absent but Declared for Prescription Verification   Oxycodone                      Not Detected UNEXPECTED ng/mg creat ==================================================================== Test                      Result    Flag   Units      Ref Range   Creatinine              44  mg/dL      >=20 ==================================================================== Declared Medications:  The flagging and interpretation on this report are based on the  following declared medications.  Unexpected results may arise from  inaccuracies in the declared medications.   **Note: The testing scope of this panel includes these medications:   Oxycodone (Roxicodone)   **Note: The testing scope of this panel does not include the  following reported medications:   Acetaminophen (Tylenol)  Biotin  Fish Oil  Fluoxetine (Prozac)  Lidocaine (Xylocaine)  Methocarbamol (Robaxin)  Naloxegol (Movantik)  Ondansetron (Zofran)  Scopolamine  Trazodone  (Desyrel)  Turmeric ==================================================================== For clinical consultation, please call [phone]. ====================================================================      ROS  Constitutional: Denies any fever or chills Gastrointestinal: No reported hemesis, hematochezia, vomiting, or acute GI distress Musculoskeletal: Denies any acute onset joint swelling, redness, loss of ROM, or weakness Neurological: No reported episodes of acute onset apraxia, aphasia, dysarthria, agnosia, amnesia, paralysis, loss of coordination, or loss of consciousness  Medication Review  Biotin, FLUoxetine, Fish Oil, HYDROcodone-acetaminophen, Turmeric, acetaminophen, lidocaine, methocarbamol, naloxegol oxalate, ondansetron, oxyCODONE, scopolamine, and traZODone  History Review  Allergy: [name] has No Known Allergies. Drug: [name]  reports no history of drug use. Alcohol:  reports current alcohol use of about 1.0 standard drink of alcohol per week. Tobacco:  reports that she has been smoking cigarettes. She has a 18.50 pack-year smoking history. She has never used smokeless tobacco. Social: [name]  reports that she has been smoking cigarettes. She has a 18.50 pack-year smoking history. She has never used smokeless tobacco. She reports current alcohol use of about 1.0 standard drink of alcohol per week. She reports that she does not use drugs. Medical:  has a past medical history of Anxiety, Chronic back pain, Degenerative joint disease, Depression, Headache, Hypotension, IBS (irritable bowel syndrome) (05/2015), and MVP (mitral valve prolapse). Surgical: [name]  has a past surgical history that includes Cesarean section; Ankle surgery (Right, 03/02/2014); Hemorrhoid surgery (N/A, 06/16/2015); Cervical spine surgery (02/21/2016); Cesarean section with bilateral tubal ligation (1990); Colonoscopy (04/19/2015); Hysteroscopy with D & C (12/24/2013);  Colonoscopy with propofol (N/A, 05/18/2018); Esophagogastroduodenoscopy (egd) with propofol (N/A, 05/18/2018); Spinal cord stimulator insertion (N/A, 06/18/2018); Tubal ligation; and Lumbar spinal cord simulator revision (N/A, 03/14/2020). Family: family history includes Arthritis in her mother; Congestive Heart Failure in her maternal grandfather; Depression in her mother; Diabetes in her maternal aunt; Diabetes Mellitus I in her maternal grandmother; Gout in her maternal aunt; Hearing loss in her father; Heart attack in her maternal grandfather; Hyperlipidemia in her father and mother; Hypertension in her father and mother; Hypothyroidism in her mother; Kidney disease in her mother; Prostate cancer in her paternal grandfather; Stroke in her maternal grandmother; Thyroid disease in her maternal aunt; Vision loss in her mother.  Laboratory Chemistry Profile   Renal Lab Results  Component Value Date   BUN 12 02/01/2021   CREATININE 0.85 02/01/2021   BCR 8 (L) 09/08/2017   GFR 77.34 02/01/2021   GFRAA 93 09/08/2017   GFRNONAA 81 09/08/2017     Hepatic Lab Results  Component Value Date   AST 15 02/01/2021   ALT 11 02/01/2021   ALBUMIN 4.4 02/01/2021   ALKPHOS 59 02/01/2021     Electrolytes Lab Results  Component Value Date   NA 139 02/01/2021   K 4.2 02/01/2021   CL 105 02/01/2021   CALCIUM 9.3 02/01/2021   MG 2.1 09/08/2017     Bone Lab Results  Component Value Date  VD25OH 43.74 01/10/2016   25OHVITD1 51 09/08/2017   25OHVITD2 <1.0 09/08/2017   25OHVITD3 51 09/08/2017     Inflammation (CRP: Acute Phase) (ESR: Chronic Phase) Lab Results  Component Value Date   CRP 1.4 09/08/2017   ESRSEDRATE 11 09/08/2017       Note: Above Lab results reviewed.  Recent Imaging Review  DG PAIN CLINIC C-ARM 1-60 MIN NO REPORT Fluoro was used, but no Radiologist interpretation will be provided.  Please refer to "NOTES" tab for provider progress note. Note: Reviewed        Physical  Exam  General appearance: Well nourished, well developed, and well hydrated. In no apparent acute distress Mental status: Alert, oriented x 3 (person, place, & time)       Respiratory: No evidence of acute respiratory distress Eyes: PERLA Vitals: BP 131/71   Pulse 69   Temp (!) 97.1 F (36.2 C)   Ht $R'5\' 1"'yJ$  (1.549 m)   Wt 145 lb (65.8 kg)   LMP 05/05/2017 (Exact Date)   SpO2 100%   BMI 27.40 kg/m  BMI: Estimated body mass index is 27.4 kg/m as calculated from the following:   Height as of this encounter: $RemoveBeforeD'5\' 1"'oVTSKBHlpLkavP$  (1.549 m).   Weight as of this encounter: 145 lb (65.8 kg). Ideal: Ideal body weight: 47.8 kg (105 lb 6.1 oz) Adjusted ideal body weight: 55 kg (121 lb 3.7 oz)  Assessment   Status Diagnosis  Controlled Controlled Controlled 1. Acute exacerbation of chronic low back pain   2. Acute left-sided low back pain without sciatica   3. Malfunction of spinal cord stimulator, initial encounter ([place])   4. Encounter for adjustment and management of neurostimulator      Updated Problems: Problem  Acute Left-Sided Low Back Pain Without Sciatica    Plan of Care  Problem-specific:  No problem-specific Assessment & Plan notes found for this encounter.  [name] has a current medication list which includes the following long-term medication(s): fluoxetine, trazodone, lidocaine, methocarbamol, naloxegol oxalate, and oxycodone.  Pharmacotherapy (Medications Ordered): Meds ordered this encounter  Medications  . ketorolac (TORADOL) injection 60 mg  . orphenadrine (NORFLEX) injection 60 mg   Orders:  Orders Placed This Encounter  Procedures  . Spinal Cord Stimulator Analysis   Follow-up plan:   Return for scheduled encounter.      Interventional Therapies  Risk  Complexity Considerations:   WNL   Planned  Pending:   Therapeutic right lumbar facet RFA #2 (02/01/2021)    Under consideration:   Diagnostic left L2 and L3 MBB (superior cluneal nerve) #1   Possible left superior cluneal nerve RFA    Completed:   Therapeutic right cervical ESI x1 (08/24/2020) Diagnostic bilateral GONB x1(08/19/2019) Palliative right cervical facet MBB x3(07/23/2018)  Palliative left cervical facet MBB x4(04/01/2019)  Palliative right cervical facet RFA x1 (09/17/2018) Palliative left cervical facet RFA x1 (05/04/2019) Palliative right L2-3 interlaminar LESI x2  Palliative right L1 TFESI x1 Bilateral lumbar spinal cord stimulator trial (done) (05/14/2018) (by me)  Bilateral lumbar spinal cord stimulator implant (09/28/2019) (by me)  Bilateral spinal cord stimulator revision (03/14/2020) (by me)  Diagnostic bilateral lumbar facet MBB x3  Diagnostic bilateral SI joint block x2  Palliative left lumbar facet RFA x1 (12/28/2019)  Therapeutic right lumbar facet RFA x2 (02/01/2021)    Therapeutic  Palliative (PRN) options:   Therapeutic right cervical ESI #2  Diagnostic bilateral GONB #2 Palliative bilateral cervical facet blocks #5 Palliative right cervical facet RFA #2  Palliative left cervical facet RFA #2  Palliative right L2-3 interlaminar LESI #3  Palliative right L1 TFESI #2 Diagnostic bilateral lumbar facet block #4  Diagnostic bilateral SI joint block #3  Palliative left lumbar facet RFA #2  Palliative right lumbar facet RFA #3      Recent Visits Date Type Provider Dept  04/03/21 Office Visit Milinda Pointer, MD [facility]  03/27/21 Procedure visit Milinda Pointer, MD [facility]  02/26/21 Office Visit Milinda Pointer, MD [facility]  02/01/21 Procedure visit Milinda Pointer, MD [facility]  01/15/21 Office Visit Milinda Pointer, MD [facility]  Showing recent visits within past 90 days and meeting all other requirements Future Appointments Date Type Provider Dept  04/09/21 Appointment Milinda Pointer, MD [facility]  05/09/21 Appointment Milinda Pointer, MD  [facility]  Showing future appointments within next 90 days and meeting all other requirements  I discussed the assessment and treatment plan with the patient. The patient was provided an opportunity to ask questions and all were answered. The patient agreed with the plan and demonstrated an understanding of the instructions.  Patient advised to call back or seek an in-person evaluation if the symptoms or condition worsens.  Duration of encounter: 30 minutes.  Note by: Gaspar Cola, MD Date: 04/03/2021; Time: 2:35 PM

## 2021-04-03 NOTE — Telephone Encounter (Signed)
Called patient and relayed info from Dr Dossie Arbour.  He would like her to come in so we can have a look at where the door hurt her and evaluate her stimulator. She will come in around 1400.

## 2021-04-03 NOTE — Progress Notes (Signed)
patjient is to go home and and charge remote and then SCS. [employer] rep will call patient on 04/04/21. Patjient not sure where the charger for her SCS is but will go home and look.

## 2021-04-04 DIAGNOSIS — M545 Low back pain, unspecified: Secondary | ICD-10-CM | POA: Insufficient documentation

## 2021-04-05 ENCOUNTER — Ambulatory Visit: Admission: RE | Admit: 2021-04-05 | Payer: Medicare PPO | Source: Ambulatory Visit

## 2021-04-09 ENCOUNTER — Encounter: Payer: Medicare PPO | Admitting: Pain Medicine

## 2021-04-11 ENCOUNTER — Encounter: Payer: Self-pay | Admitting: Primary Care

## 2021-04-11 ENCOUNTER — Ambulatory Visit (INDEPENDENT_AMBULATORY_CARE_PROVIDER_SITE_OTHER): Payer: Medicare PPO | Admitting: Primary Care

## 2021-04-11 ENCOUNTER — Other Ambulatory Visit: Payer: Self-pay

## 2021-04-11 VITALS — BP 126/74 | HR 74 | Temp 98.6°F | Ht 61.0 in | Wt 146.0 lb

## 2021-04-11 DIAGNOSIS — G8929 Other chronic pain: Secondary | ICD-10-CM

## 2021-04-11 DIAGNOSIS — E785 Hyperlipidemia, unspecified: Secondary | ICD-10-CM | POA: Diagnosis not present

## 2021-04-11 DIAGNOSIS — Z86010 Personal history of colonic polyps: Secondary | ICD-10-CM

## 2021-04-11 DIAGNOSIS — K5909 Other constipation: Secondary | ICD-10-CM

## 2021-04-11 DIAGNOSIS — G894 Chronic pain syndrome: Secondary | ICD-10-CM | POA: Diagnosis not present

## 2021-04-11 DIAGNOSIS — F32A Depression, unspecified: Secondary | ICD-10-CM

## 2021-04-11 DIAGNOSIS — M7918 Myalgia, other site: Secondary | ICD-10-CM | POA: Diagnosis not present

## 2021-04-11 DIAGNOSIS — Z1231 Encounter for screening mammogram for malignant neoplasm of breast: Secondary | ICD-10-CM

## 2021-04-11 DIAGNOSIS — F411 Generalized anxiety disorder: Secondary | ICD-10-CM | POA: Diagnosis not present

## 2021-04-11 DIAGNOSIS — G4700 Insomnia, unspecified: Secondary | ICD-10-CM

## 2021-04-11 MED ORDER — BUSPIRONE HCL 10 MG PO TABS: 10.0000 mg | ORAL_TABLET | Freq: Two times a day (BID) | ORAL | 0 refills | Status: DC

## 2021-04-11 MED ORDER — TIZANIDINE HCL 4 MG PO TABS: 4.0000 mg | ORAL_TABLET | Freq: Three times a day (TID) | ORAL | 0 refills | Status: DC | PRN

## 2021-04-11 NOTE — Assessment & Plan Note (Signed)
LDL improved to 132 from 180 several years ago. Discussed to work on diet, try to get some exercise.   Continue to monitor.

## 2021-04-11 NOTE — Assessment & Plan Note (Signed)
Following with pain management who is no longer prescribing muscle relaxer treatment.  Will trial Tizanidine 4 mg every 8 hours PRN. She will update.

## 2021-04-11 NOTE — Assessment & Plan Note (Signed)
Colonoscopy UTD, due again in 2024.

## 2021-04-11 NOTE — Progress Notes (Signed)
Subjective:    Patient ID: Karen Edwards, female    DOB: 03-Aug-1966, 55 y.o.   MRN: [id number]  HPI  Karen Edwards is a very pleasant 55 y.o. female with a history of chronic pain syndrome, chronic constipation, depression, hyperlipidemia who presents today for follow up of chronic conditions. She is due for a mammogram.   1) Chronic Pain: Chronic to neck and back. Follows with pain management and is managed on oxycodone IR 5 mg, methocarbamol 500 mg, lidocaine 5%.  She has been out of her methocarbamol 500 mg for months as her pain management doctor will no longer prescribe. She was taking methocarbamol 500 mg, 2 tablets HS. She would like to try a different muscle relaxer, Flexeril was ineffective.   She has a spinal cord stimulator that was placed 1-2 years ago which has made some improvement.   She is not taking Movantik 12.5 mg for opioid induced constipation due to cost. It was very effective.   2) Depression: Currently managed on fluoxetine 60 mg daily and Trazodone 300 HS. Overall doing well on this current regimen for depression, but continues to struggle with anxiety. Symptoms include being "jumpy", feeling nevous, some worry. She believes she may have PTSD from the death of her husband several years ago. She is not interested in talking with a therapist.   3) Hyperlipidemia: Not currently on treatment. LDL recently of 132, three years ago at 180. She admits to a poor diet, eats take out/fast food often. She is not exercising.     The 10-year ASCVD risk score Mikey Bussing DC Brooke Bonito., et al., 2013) is: 6.5%   Values used to calculate the score:     Age: 60 years     Sex: Female     Is Non-Hispanic African American: No     Diabetic: No     Tobacco smoker: Yes     Systolic Blood Pressure: 233 mmHg     Is BP treated: No     HDL Cholesterol: 39 mg/dL     Total Cholesterol: 202 mg/dL   Review of Systems  Respiratory:  Negative for shortness of breath.   Cardiovascular:  Negative  for chest pain.  Musculoskeletal:  Positive for arthralgias and back pain.  Psychiatric/Behavioral:  The patient is nervous/anxious.         Past Medical History:  Diagnosis Date   Anxiety    Chronic back pain    Degenerative joint disease    Depression    Headache    neck pain   Hypotension    IBS (irritable bowel syndrome) 05/2015   MVP (mitral valve prolapse)    patient denies    Social History   Socioeconomic History   Marital status: Widowed    Spouse name: Not on file   Number of children: 2   Years of education: Not on file   Highest education level: Not on file  Occupational History   Occupation: disabled  Tobacco Use   Smoking status: Every Day    Packs/day: 0.50    Years: 37.00    Pack years: 18.50    Types: Cigarettes   Smokeless tobacco: Never  Vaping Use   Vaping Use: Never used  Substance and Sexual Activity   Alcohol use: Yes    Alcohol/week: 1.0 standard drink    Types: 1 Cans of beer per week    Comment: drinks an occasional beer   Drug use: No   Sexual activity: Yes  Birth control/protection: None, Surgical    Comment: tubal ligation  Other Topics Concern   Not on file  Social History Narrative   Married.   2 children. One boy and one girl.   On disability.   Playing with her dogs, gardening.   Social Determinants of Health   Financial Resource Strain: Not on file  Food Insecurity: Not on file  Transportation Needs: Not on file  Physical Activity: Not on file  Stress: Not on file  Social Connections: Not on file  Intimate Partner Violence: Not on file    Past Surgical History:  Procedure Laterality Date   ANKLE SURGERY Right 03/02/2014   repair of tendon and sural nerve right ankle   CERVICAL SPINE SURGERY  02/21/2016   arthrodesis of anterior cervical spine (fusion of C5-6,C6-7 and insertion of bone allograft   CESAREAN SECTION     CESAREAN SECTION WITH BILATERAL TUBAL LIGATION  1990   COLONOSCOPY  04/19/2015    COLONOSCOPY WITH PROPOFOL N/A 05/18/2018   Procedure: COLONOSCOPY WITH PROPOFOL;  Surgeon: Lin Landsman, MD;  Location: [facility];  Service: Gastroenterology;  Laterality: N/A;   ESOPHAGOGASTRODUODENOSCOPY (EGD) WITH PROPOFOL N/A 05/18/2018   Procedure: ESOPHAGOGASTRODUODENOSCOPY (EGD) WITH PROPOFOL;  Surgeon: Lin Landsman, MD;  Location: [facility];  Service: Gastroenterology;  Laterality: N/A;   HEMORRHOID SURGERY N/A 06/16/2015   Procedure: HEMORRHOIDECTOMY;  Surgeon: Leonie Green, MD;  Location: [facility];  Service: General;  Laterality: N/A;   HYSTEROSCOPY WITH D & C  12/24/2013   LUMBAR SPINAL CORD SIMULATOR REVISION N/A 03/14/2020   Procedure: LUMBAR SPINAL CORD SIMULATOR REVISION;  Surgeon: Milinda Pointer, MD;  Location: [facility];  Service: Neurosurgery;  Laterality: N/A;   SPINAL CORD STIMULATOR INSERTION N/A 06/18/2018   Procedure: LUMBAR SPINAL CORD STIMULATOR INSERTION;  Surgeon: Milinda Pointer, MD;  Location: [facility];  Service: Neurosurgery;  Laterality: N/A;   TUBAL LIGATION      Family History  Problem Relation Age of Onset   Arthritis Mother    Depression Mother    Hyperlipidemia Mother    Hypertension Mother    Kidney disease Mother    Vision loss Mother    Hypothyroidism Mother    Hearing loss Father    Hyperlipidemia Father    Hypertension Father    Diabetes Mellitus I Maternal Grandmother    Stroke Maternal Grandmother    Congestive Heart Failure Maternal Grandfather    Heart attack Maternal Grandfather    Prostate cancer Paternal Grandfather    Thyroid disease Maternal Aunt    Diabetes Maternal Aunt    Gout Maternal Aunt     No Known Allergies  Current Outpatient Medications on File Prior to Visit  Medication Sig Dispense Refill   acetaminophen (TYLENOL) 500 MG tablet Take 1,000 mg by mouth every 6 (six) hours as needed (for pain.).      Biotin 5000 MCG TABS Take 5,000 mcg by mouth daily.     FLUoxetine (PROZAC) 20 MG  capsule Take 3 capsules (60 mg total) by mouth daily. For depression. 270 capsule 3   Omega-3 Fatty Acids (FISH OIL) 1200 MG CAPS Take 1,200 mg by mouth daily.     ondansetron (ZOFRAN-ODT) 4 MG disintegrating tablet Take 4 mg by mouth every 4 (four) hours as needed (vertigo/dizziness).      oxyCODONE (OXY IR/ROXICODONE) 5 MG immediate release tablet Take 1 tablet (5 mg total) by mouth 2 (two) times daily as needed for severe pain. Must last 30 days.  40 tablet 0   scopolamine (TRANSDERM-SCOP) 1 MG/3DAYS Place 1 patch onto the skin daily as needed (vertigo/dizziness).     traZODone (DESYREL) 100 MG tablet Take 3 tablets (300 mg total) by mouth at bedtime. For sleep. 270 tablet 3   TURMERIC PO Take 1 capsule by mouth daily.     No current facility-administered medications on file prior to visit.    BP 126/74   Pulse 74   Temp 98.6 F (37 C) (Temporal)   Ht 5\' 1"  (1.549 m)   Wt 146 lb (66.2 kg)   LMP 05/05/2017 (Exact Date)   SpO2 96%   BMI 27.59 kg/m  Objective:   Physical Exam Cardiovascular:     Rate and Rhythm: Normal rate and regular rhythm.  Pulmonary:     Effort: Pulmonary effort is normal.     Breath sounds: Normal breath sounds.  Musculoskeletal:     Cervical back: Neck supple.  Skin:    General: Skin is warm and dry.  Psychiatric:        Mood and Affect: Mood normal.          Assessment & Plan:      This visit occurred during the SARS-CoV-2 public health emergency.  Safety protocols were in place, including screening questions prior to the visit, additional usage of staff PPE, and extensive cleaning of exam room while observing appropriate contact time as indicated for disinfecting solutions.

## 2021-04-11 NOTE — Assessment & Plan Note (Signed)
Doing well on Trazodone 300 mg HS, continue same.

## 2021-04-11 NOTE — Assessment & Plan Note (Signed)
Feels well managed on fluoxetine 60 mg, continue same. See notes under GAD for anxiety.

## 2021-04-11 NOTE — Assessment & Plan Note (Signed)
Continued. Movantik cost prohibitive but was effective.  Encouraged improvement in diet and good water intake.

## 2021-04-11 NOTE — Patient Instructions (Signed)
[facility].   Try the tizanidine muscle relaxer. You can take this every 8 hours as needed for muscle spasms.  Start buspirone (Buspar) 10 mg for anxiety, you can take this twice daily for anxiety.

## 2021-04-11 NOTE — Assessment & Plan Note (Signed)
Chronic and agree that she may have some PTSD. Avoid Benzos given risk for dependence and her chronic opioid use.  Rx for Buspirone 10 mg sent to pharmacy. Discussed to start once daily x 1 week, then twice daily thereafter.  She will update.

## 2021-04-14 ENCOUNTER — Ambulatory Visit
Admission: RE | Admit: 2021-04-14 | Discharge: 2021-04-14 | Disposition: A | Discharge status: Home / Self Care | Payer: Medicare PPO | Source: Ambulatory Visit | Attending: Pain Medicine | Admitting: Pain Medicine

## 2021-04-14 ENCOUNTER — Other Ambulatory Visit: Payer: Self-pay

## 2021-04-14 DIAGNOSIS — M5137 Other intervertebral disc degeneration, lumbosacral region: Secondary | ICD-10-CM | POA: Insufficient documentation

## 2021-04-14 DIAGNOSIS — M545 Low back pain, unspecified: Secondary | ICD-10-CM | POA: Diagnosis not present

## 2021-04-14 DIAGNOSIS — M79604 Pain in right leg: Secondary | ICD-10-CM | POA: Insufficient documentation

## 2021-04-14 DIAGNOSIS — M5136 Other intervertebral disc degeneration, lumbar region: Secondary | ICD-10-CM | POA: Insufficient documentation

## 2021-04-14 DIAGNOSIS — M5126 Other intervertebral disc displacement, lumbar region: Secondary | ICD-10-CM | POA: Insufficient documentation

## 2021-04-14 DIAGNOSIS — G8929 Other chronic pain: Secondary | ICD-10-CM | POA: Diagnosis not present

## 2021-04-14 DIAGNOSIS — M79605 Pain in left leg: Secondary | ICD-10-CM | POA: Diagnosis not present

## 2021-04-15 DIAGNOSIS — Z79891 Long term (current) use of opiate analgesic: Secondary | ICD-10-CM | POA: Insufficient documentation

## 2021-04-15 NOTE — Progress Notes (Signed)
PROVIDER NOTE: Information contained herein reflects review and annotations entered in association with encounter. Interpretation of such information and data should be left to medically-trained personnel. Information provided to patient can be located elsewhere in the medical record under "Patient Instructions". Document created using STT-dictation technology, any transcriptional errors that may result from process are unintentional.    Patient: Karen Edwards  Service Category: E/M  Provider: Gaspar Cola, MD  DOB: 11-Dec-1965  DOS: 04/16/2021  Specialty: Interventional Pain Management  MRN: [id number]  Setting: Ambulatory outpatient  PCP: Pleas Koch, NP  Type: Established Patient    Referring Provider: Pleas Koch, NP  Location: Office  Delivery: Face-to-face     HPI  Karen Edwards, a 55 y.o. year old female, is here today because of her Chronic pain syndrome [G89.4]. Karen Edwards primary complain today is Back Pain (Lumbar bilateral , left is currently worse at this time. ) Last encounter: My last encounter with her was on 04/03/2021. Pertinent problems: Karen Edwards has Chronic low back pain (1ry area of Pain) (Bilateral) (R>L) w/ sciatica (Bilateral); DDD (degenerative disc disease), lumbar; DDD (degenerative disc disease), cervical; Chronic occipital neuralgia (5th area of Pain) (Bilateral) (R>L); Lumbar facet syndrome (Bilateral) (L>R); DDD (degenerative disc disease), lumbosacral; Lumbar radicular pain (Right) (L4); Sacroiliac joint dysfunction (Bilateral); Chronic neck pain (4th area of Pain) (Bilateral) (R>L); Numbness and tingling of upper extremity (Right); Chronic upper extremity pain (Bilateral) (R>L); Chronic sacroiliac joint pain (Bilateral) (L>R); Lumbar facet hypertrophy (Bilateral); L1-2 disc extrusion (Right); Cervical foraminal stenosis (C5-6) (Bilateral); Chronic pain syndrome; Chronic cervical radicular pain (Right: C6/C7) (Left: C5/T1); Chronic lumbar  radicular pain; Chronic hip pain (3ry area of Pain) (Bilateral) (R>L); Chronic lower extremity pain (2ry area of Pain) (Bilateral) (R>L); Chronic shoulder pain (Bilateral) (L>R); Chronic musculoskeletal pain; Lumbar spondylosis; Cervical facet syndrome (Bilateral) (R>L); Myofascial pain; Spondylosis without myelopathy or radiculopathy, cervical region; Cervicalgia; S/P insertion of Epidural Neurostimulator (SCS) (Bilateral); Chronic neck pain with history of cervical spinal surgery; 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[facility]); Trigger point with back pain (Right); Cervicogenic headache (Bilateral); Occipital headache (Bilateral); Chronic tension-type headache, intractable; Cervico-occipital neuralgia (Bilateral); Pain due to any device, implant or graft (SCS battery) (Left PSIS area); Spondylosis without myelopathy or radiculopathy, lumbosacral region; History of cervical spinal surgery (C5-7 ACDF); Acute postoperative pain; Cervical radiculopathy at C6 (Bilateral); Chronic low back pain (Bilateral) w/o sciatica; Presence of neurostimulator (Thoracolumbar); 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 Pain Assessment: Severity of Chronic pain is reported as a 8 /10. Location: Back Lower, Left, Right/into hips, left hips is sore to the touch.. Onset: More than a month ago. Quality: Discomfort, Constant, Sore, Burning, Aching, Stabbing. Timing: Constant. Modifying factor(s): nothing currently. Vitals:  height is _0  (1.549 m) and weight is 145 lb (65.8 kg). Her temporal temperature is 97.9 F (36.6 C). Her blood pressure is 119/76 and her pulse is 71. Her respiration is 16 and oxygen saturation is 100%.   Reason for encounter: follow-up evaluation   The patient indicates doing well with the current medication regimen.  She is still having constipation to the opioids.  Previously I have prescribed some  medication specific for this, but it was not covered by her insurance and she indicated that she could not afford it.  She is taking over-the-counter medications and she is getting by with it.  The patient also indicates that they were able to  jump start her spinal cord stimulator battery again and it is working well.  She is currently using it and getting good results.  On 04/14/2021 she had a lumbar MRI done, but unfortunately the results from the radiologist are not back yet.  Today I took a look at the films and I do see that she has some foraminal stenosis secondary to a combination of disc bulging and facet hypertrophy.  I have explained to the patient that we will need to wait until we get the official report from the radiologist.  She indicates still having quite a bit of pain in the left lower back (she is having pain bilaterally, but the left is worse than the right).  Physical exam today was positive on hyperextension and rotation as well as Kemp maneuver for bilateral lumbar facet arthralgia.  However, he has not been long since we did the radiofrequency ablation.  She indicates having allodynia and burning sensation over the left lower back for her implant batteries located.  At this point, I have offered the patient a diagnostic superior cluneal nerve block to see if this can provide her with some temporary relief of the pain.  If it does, we could go back and do a radiofrequency ablation of the nerve to see if we can provide her with a longer lasting benefit.  Today we will update her UDS.  In addition, she has been having this burning sensation and she indicates that she has never really tried any gabapentin or Lyrica.  I looked into the historical portion of the chart and I did encounter where she had taken gabapentin 300 mg 4 times daily, but this was discontinued in 2018 with a recent stating "no longer needs it".  As to the prescriber it simply said "historical provider".  Today we will go  ahead and do a trial starting her on gabapentin 100 mg p.o. at bedtime and we will slowly increase it as tolerated every 14 days.  RTCB: 07/15/2021 Nonopioids transfer 08/23/2020: Lidocaine 5% ointment and Robaxin  Pharmacotherapy Assessment  Analgesic: Oxycodone IR 5 mg, 1 tab PO QD (5 mg/day of oxycodone) PRN MME/day: 7.5 mg/day.   Monitoring: [place] PMP: PDMP reviewed during this encounter.       Pharmacotherapy: No side-effects or adverse reactions reported. Compliance: No problems identified. Effectiveness: Clinically acceptable.  Janett Billow, RN  04/16/2021  9:56 AM  Sign when Signing Visit Nursing Pain Medication Assessment:  Safety precautions to be maintained throughout the outpatient stay will include: orient to surroundings, keep bed in low position, maintain call bell within reach at all times, provide assistance with transfer out of bed and ambulation.  Medication Inspection Compliance: Pill count conducted under aseptic conditions, in front of the patient. Neither the pills nor the bottle was removed from the patient's sight at any time. Once count was completed pills were immediately returned to the patient in their original bottle.  Medication #1: Hydrocodone/APAP Pill/Patch Count:  7 of 21 pills remain Pill/Patch Appearance: Markings consistent with prescribed medication Bottle Appearance: Standard pharmacy container. Clearly labeled. Filled Date: 05 / 31 / 2022 Last Medication intake:  Day before yesterday  Medication #2: Hydrocodone/APAP Pill/Patch Count:  6 of 21 pills remain Pill/Patch Appearance: Markings consistent with prescribed medication Bottle Appearance: Standard pharmacy container. Clearly labeled. Filled Date: 05 / 14 /  2022 Last Medication intake:  Day before yesterday    UDS:  Summary  Date Value Ref Range Status  01/15/2021 Note  Final  Comment:    ==================================================================== Tox[employer] Select 13  (MW) ==================================================================== Test                             Result       Flag       Units  Drug Absent but Declared for Prescription Verification   Oxycodone                      Not Detected UNEXPECTED ng/mg creat ==================================================================== Test                      Result    Flag   Units      Ref Range   Creatinine              44               mg/dL      >=20 ==================================================================== Declared Medications:  The flagging and interpretation on this report are based on the  following declared medications.  Unexpected results may arise from  inaccuracies in the declared medications.   **Note: The testing scope of this panel includes these medications:   Oxycodone (Roxicodone)   **Note: The testing scope of this panel does not include the  following reported medications:   Acetaminophen (Tylenol)  Biotin  Fish Oil  Fluoxetine (Prozac)  Lidocaine (Xylocaine)  Methocarbamol (Robaxin)  Naloxegol (Movantik)  Ondansetron (Zofran)  Scopolamine  Trazodone (Desyrel)  Turmeric ==================================================================== For clinical consultation, please call [phone]. ====================================================================      ROS  Constitutional: Denies any fever or chills Gastrointestinal: No reported hemesis, hematochezia, vomiting, or acute GI distress Musculoskeletal: Denies any acute onset joint swelling, redness, loss of ROM, or weakness Neurological: No reported episodes of acute onset apraxia, aphasia, dysarthria, agnosia, amnesia, paralysis, loss of coordination, or loss of consciousness  Medication Review  Biotin, FLUoxetine, Fish Oil, Turmeric, acetaminophen, busPIRone, gabapentin, ondansetron, oxyCODONE, scopolamine, tiZANidine, and traZODone  History Review  Allergy: Karen Edwards has No  Known Allergies. Drug: Karen Edwards  reports no history of drug use. Alcohol:  reports current alcohol use of about 1.0 standard drink of alcohol per week. Tobacco:  reports that she has been smoking cigarettes. She has a 18.50 pack-year smoking history. She has never used smokeless tobacco. Social: Ms. Kirkendoll  reports that she has been smoking cigarettes. She has a 18.50 pack-year smoking history. She has never used smokeless tobacco. She reports current alcohol use of about 1.0 standard drink of alcohol per week. She reports that she does not use drugs. Medical:  has a past medical history of Anxiety, Chronic back pain, Degenerative joint disease, Depression, Headache, Hypotension, IBS (irritable bowel syndrome) (05/2015), and MVP (mitral valve prolapse). Surgical: Karen Edwards  has a past surgical history that includes Cesarean section; Ankle surgery (Right, 03/02/2014); Hemorrhoid surgery (N/A, 06/16/2015); Cervical spine surgery (02/21/2016); Cesarean section with bilateral tubal ligation (1990); Colonoscopy (04/19/2015); Hysteroscopy with D & C (12/24/2013); Colonoscopy with propofol (N/A, 05/18/2018); Esophagogastroduodenoscopy (egd) with propofol (N/A, 05/18/2018); Spinal cord stimulator insertion (N/A, 06/18/2018); Tubal ligation; and Lumbar spinal cord simulator revision (N/A, 03/14/2020). Family: family history includes Arthritis in her mother; Congestive Heart Failure in her maternal grandfather; Depression in her mother; Diabetes in her maternal aunt; Diabetes Mellitus I in her maternal grandmother; Gout in her maternal aunt; Hearing loss in her father; Heart attack in her maternal grandfather; Hyperlipidemia in her father and mother; Hypertension  in her father and mother; Hypothyroidism in her mother; Kidney disease in her mother; Prostate cancer in her paternal grandfather; Stroke in her maternal grandmother; Thyroid disease in her maternal aunt; Vision loss in her mother.  Laboratory Chemistry  Profile   Renal Lab Results  Component Value Date   BUN 12 02/01/2021   CREATININE 0.85 02/01/2021   BCR 8 (L) 09/08/2017   GFR 77.34 02/01/2021   GFRAA 93 09/08/2017   GFRNONAA 81 09/08/2017     Hepatic Lab Results  Component Value Date   AST 15 02/01/2021   ALT 11 02/01/2021   ALBUMIN 4.4 02/01/2021   ALKPHOS 59 02/01/2021     Electrolytes Lab Results  Component Value Date   NA 139 02/01/2021   K 4.2 02/01/2021   CL 105 02/01/2021   CALCIUM 9.3 02/01/2021   MG 2.1 09/08/2017     Bone Lab Results  Component Value Date   VD25OH 43.74 01/10/2016   25OHVITD1 51 09/08/2017   25OHVITD2 <1.0 09/08/2017   25OHVITD3 51 09/08/2017     Inflammation (CRP: Acute Phase) (ESR: Chronic Phase) Lab Results  Component Value Date   CRP 1.4 09/08/2017   ESRSEDRATE 11 09/08/2017       Note: Above Lab results reviewed.  Recent Imaging Review  DG PAIN CLINIC C-ARM 1-60 MIN NO REPORT Fluoro was used, but no Radiologist interpretation will be provided.  Please refer to "NOTES" tab for provider progress note. Note: Reviewed        Physical Exam  General appearance: Well nourished, well developed, and well hydrated. In no apparent acute distress Mental status: Alert, oriented x 3 (person, place, & time)       Respiratory: No evidence of acute respiratory distress Eyes: PERLA Vitals: BP 119/76 (BP Location: Left Arm, Patient Position: Sitting, Cuff Size: Normal)   Pulse 71   Temp 97.9 F (36.6 C) (Temporal)   Resp 16   Ht _0  (1.549 m)   Wt 145 lb (65.8 kg)   LMP 05/05/2017 (Exact Date)   SpO2 100%   BMI 27.40 kg/m  BMI: Estimated body mass index is 27.4 kg/m as calculated from the following:   Height as of this encounter: _1  (1.549 m).   Weight as of this encounter: 145 lb (65.8 kg). Ideal: Ideal body weight: 47.8 kg (105 lb 6.1 oz) Adjusted ideal body weight: 55 kg (121 lb 3.7 oz)  Assessment   Status Diagnosis  Controlled Controlled Controlled 1. Chronic  pain syndrome   2. Lumbar facet syndrome (Bilateral) (L>R)   3. Encounter for adjustment and management of neurostimulator   4. Chronic low back pain (Bilateral) w/o sciatica   5. Chronic lower extremity pain (2ry area of Pain) (Bilateral) (R>L)   6. Pharmacologic therapy   7. Chronic use of opiate for therapeutic purpose   8. Presence of neurostimulator (Thoracolumbar)   9. Neurogenic pain      Updated Problems: Problem  Neurogenic Pain     Plan of Care  Problem-specific:  No problem-specific Assessment & Plan notes found for this encounter.  Karen Edwards has a current medication list which includes the following long-term medication(s): fluoxetine, gabapentin, oxycodone, [START ON 05/16/2021] oxycodone, [START ON 06/15/2021] oxycodone, and trazodone.  Pharmacotherapy (Medications Ordered): Meds ordered this encounter  Medications   oxyCODONE (OXY IR/ROXICODONE) 5 MG immediate release tablet    Sig: Take 1 tablet (5 mg total) by mouth 2 (two) times daily as needed for severe pain. Must  last 30 days.    Dispense:  40 tablet    Refill:  0    Not a duplicate. Do NOT delete! Dispense 1 day early if closed on refill date. Avoid benzodiazepines within 8 hours of opioids. Do not send refill requests.   oxyCODONE (OXY IR/ROXICODONE) 5 MG immediate release tablet    Sig: Take 1 tablet (5 mg total) by mouth 2 (two) times daily as needed for severe pain. Must last 30 days.    Dispense:  40 tablet    Refill:  0    Not a duplicate. Do NOT delete! Dispense 1 day early if closed on refill date. Avoid benzodiazepines within 8 hours of opioids. Do not send refill requests.   oxyCODONE (OXY IR/ROXICODONE) 5 MG immediate release tablet    Sig: Take 1 tablet (5 mg total) by mouth 2 (two) times daily as needed for severe pain. Must last 30 days.    Dispense:  40 tablet    Refill:  0    Not a duplicate. Do NOT delete! Dispense 1 day early if closed on refill date. Avoid benzodiazepines  within 8 hours of opioids. Do not send refill requests.   gabapentin (NEURONTIN) 100 MG capsule    Sig: Take 1 capsule (100 mg total) by mouth at bedtime for 14 days, THEN 2 capsules (200 mg total) at bedtime for 14 days, THEN 3 capsules (300 mg total) at bedtime for 16 days. Follow written titration schedule.    Dispense:  90 capsule    Refill:  0    Fill one day early if pharmacy is closed on scheduled refill date. May substitute for generic if available.    Orders:  Orders Placed This Encounter  Procedures   Misc procedure    Standing Status:   Future    Standing Expiration Date:   06/16/2021    Scheduling Instructions:     Type of Block:  Left Superior Cluneal nerve block     Side: Left-sided     Sedation: With Sedation.     Timeframe: ASAA   ToxASSURE Select 13 (MW), Urine    Volume: 30 ml(s). Minimum 3 ml of urine is needed. Document temperature of fresh sample. Indications: Long term (current) use of opiate analgesic [phone])    Order Specific Question:   Release to patient    Answer:   Immediate   Nursing Instructions:    Please call this patient's pharmacy and cancel any older opioid analgesic scripts that may be left from prior visits.  Tell the pharmacist to only keep those sent today.    Follow-up plan:   Return in about 3 months (around 07/15/2021) for evaluation day (F2F) (MM), in addition, Procedure (w/ sedation): (L) Superior Cluneal NB #1.     Interventional Therapies  Risk  Complexity Considerations:   WNL   Planned  Pending:   Therapeutic right lumbar facet RFA #2 (02/01/2021)    Under consideration:   Diagnostic left L2 and L3 MBB (superior cluneal nerve) #1  Possible left superior cluneal nerve RFA    Completed:   Therapeutic right cervical ESI x1 (08/24/2020) Diagnostic bilateral GONB x1 (08/19/2019) Palliative right cervical facet MBB x3 (07/23/2018)  Palliative left cervical facet MBB x4 (04/01/2019)  Palliative right cervical facet RFA x1  (09/17/2018) Palliative left cervical facet RFA x1 (05/04/2019) Palliative right L2-3 interlaminar LESI x2  Palliative right L1 TFESI x1  Bilateral lumbar spinal cord stimulator trial (done) (05/14/2018) (by me)  Bilateral lumbar spinal  cord stimulator implant (09/28/2019) (by me)  Bilateral spinal cord stimulator revision (03/14/2020) (by me)  Diagnostic bilateral lumbar facet MBB x3  Diagnostic bilateral SI joint block x2  Palliative left lumbar facet RFA x2 (03/27/2021)  Therapeutic right lumbar facet RFA x2 (02/01/2021)    Therapeutic  Palliative (PRN) options:   Therapeutic right cervical ESI #2  Diagnostic bilateral GONB #2  Palliative bilateral cervical facet blocks #5  Palliative right cervical facet RFA #2  Palliative left cervical facet RFA #2  Palliative right L2-3 interlaminar LESI #3  Palliative right L1 TFESI #2  Diagnostic bilateral lumbar facet block #4  Diagnostic bilateral SI joint block #3  Palliative left lumbar facet RFA #2  Palliative right lumbar facet RFA #3     Recent Visits Date Type Provider Dept  04/03/21 Office Visit Milinda Pointer, MD [facility]  03/27/21 Procedure visit Milinda Pointer, MD [facility]  02/26/21 Office Visit Milinda Pointer, MD [facility]  02/01/21 Procedure visit Milinda Pointer, MD [facility]  Showing recent visits within past 90 days and meeting all other requirements Today's Visits Date Type Provider Dept  04/16/21 Office Visit Milinda Pointer, MD [facility]  Showing today's visits and meeting all other requirements Future Appointments Date Type Provider Dept  05/09/21 Appointment Milinda Pointer, MD [facility]  Showing future appointments within next 90 days and meeting all other requirements I discussed the assessment and treatment plan with the patient. The patient was provided an opportunity to ask questions and all were answered. The patient  agreed with the plan and demonstrated an understanding of the instructions.  Patient advised to call back or seek an in-person evaluation if the symptoms or condition worsens.  Duration of encounter: 30 minutes.  Note by: Gaspar Cola, MD Date: 04/16/2021; Time: 10:42 AM

## 2021-04-16 ENCOUNTER — Other Ambulatory Visit: Payer: Self-pay

## 2021-04-16 ENCOUNTER — Encounter: Payer: Self-pay | Admitting: Pain Medicine

## 2021-04-16 ENCOUNTER — Ambulatory Visit: Payer: Medicare PPO | Attending: Pain Medicine | Admitting: Pain Medicine

## 2021-04-16 VITALS — BP 119/76 | HR 71 | Temp 97.9°F | Resp 16 | Ht 61.0 in | Wt 145.0 lb

## 2021-04-16 DIAGNOSIS — Z79891 Long term (current) use of opiate analgesic: Secondary | ICD-10-CM | POA: Diagnosis not present

## 2021-04-16 DIAGNOSIS — M47816 Spondylosis without myelopathy or radiculopathy, lumbar region: Secondary | ICD-10-CM | POA: Insufficient documentation

## 2021-04-16 DIAGNOSIS — Z4542 Encounter for adjustment and management of neuropacemaker (brain) (peripheral nerve) (spinal cord): Secondary | ICD-10-CM | POA: Diagnosis not present

## 2021-04-16 DIAGNOSIS — Z9682 Presence of neurostimulator: Secondary | ICD-10-CM | POA: Diagnosis not present

## 2021-04-16 DIAGNOSIS — G894 Chronic pain syndrome: Secondary | ICD-10-CM

## 2021-04-16 DIAGNOSIS — Z79899 Other long term (current) drug therapy: Secondary | ICD-10-CM | POA: Insufficient documentation

## 2021-04-16 DIAGNOSIS — M79604 Pain in right leg: Secondary | ICD-10-CM | POA: Diagnosis not present

## 2021-04-16 DIAGNOSIS — M792 Neuralgia and neuritis, unspecified: Secondary | ICD-10-CM | POA: Insufficient documentation

## 2021-04-16 DIAGNOSIS — M545 Low back pain, unspecified: Secondary | ICD-10-CM | POA: Insufficient documentation

## 2021-04-16 DIAGNOSIS — G8929 Other chronic pain: Secondary | ICD-10-CM

## 2021-04-16 DIAGNOSIS — M79605 Pain in left leg: Secondary | ICD-10-CM | POA: Diagnosis not present

## 2021-04-16 MED ORDER — OXYCODONE HCL 5 MG PO TABS: 5.0000 mg | ORAL_TABLET | Freq: Two times a day (BID) | ORAL | 0 refills | Status: DC | PRN

## 2021-04-16 MED ORDER — GABAPENTIN 100 MG PO CAPS: ORAL_CAPSULE | ORAL | 0 refills | Status: DC

## 2021-04-16 NOTE — Patient Instructions (Addendum)
____________________________________________________________________________________________  Medication Rules  Purpose: To inform patients, and their family members, of our rules and regulations.  Applies to: All patients receiving prescriptions (written or electronic).  Pharmacy of record: Pharmacy where electronic prescriptions will be sent. If written prescriptions are taken to a different pharmacy, please inform the nursing staff. The pharmacy listed in the electronic medical record should be the one where you would like electronic prescriptions to be sent.  Electronic prescriptions: In compliance with the [place] Strengthen Opioid Misuse Prevention (STOP) Act of 2017 (Session Law 2017-74/H243), effective October 28, 2018, all controlled substances must be electronically prescribed. Calling prescriptions to the pharmacy will cease to exist.  Prescription refills: Only during scheduled appointments. Applies to all prescriptions.  NOTE: The following applies primarily to controlled substances (Opioid* Pain Medications).   Type of encounter (visit): For patients receiving controlled substances, face-to-face visits are required. (Not an option or up to the patient.)  Patient's responsibilities: Pain Pills: Bring all pain pills to every appointment (except for procedure appointments). Pill Bottles: Bring pills in original pharmacy bottle. Always bring the newest bottle. Bring bottle, even if empty. Medication refills: You are responsible for knowing and keeping track of what medications you take and those you need refilled. The day before your appointment: write a list of all prescriptions that need to be refilled. The day of the appointment: give the list to the admitting nurse. Prescriptions will be written only during appointments. No prescriptions will be written on procedure days. If you forget a medication: it will not be "Called in", "Faxed", or "electronically sent". You will  need to get another appointment to get these prescribed. No early refills. Do not call asking to have your prescription filled early. Prescription Accuracy: You are responsible for carefully inspecting your prescriptions before leaving our office. Have the discharge nurse carefully go over each prescription with you, before taking them home. Make sure that your name is accurately spelled, that your address is correct. Check the name and dose of your medication to make sure it is accurate. Check the number of pills, and the written instructions to make sure they are clear and accurate. Make sure that you are given enough medication to last until your next medication refill appointment. Taking Medication: Take medication as prescribed. When it comes to controlled substances, taking less pills or less frequently than prescribed is permitted and encouraged. Never take more pills than instructed. Never take medication more frequently than prescribed.  Inform other Doctors: Always inform, all of your healthcare providers, of all the medications you take. Pain Medication from other Providers: You are not allowed to accept any additional pain medication from any other Doctor or Healthcare provider. There are two exceptions to this rule. (see below) In the event that you require additional pain medication, you are responsible for notifying us, as stated below. Cough Medicine: Often these contain an opioid, such as codeine or hydrocodone. Never accept or take cough medicine containing these opioids if you are already taking an opioid* medication. The combination may cause respiratory failure and death. Medication Agreement: You are responsible for carefully reading and following our Medication Agreement. This must be signed before receiving any prescriptions from our practice. Safely store a copy of your signed Agreement. Violations to the Agreement will result in no further prescriptions. (Additional copies of our  Medication Agreement are available upon request.) Laws, Rules, & Regulations: All patients are expected to follow all Federal and State Laws, Statutes, Rules, & Regulations. Ignorance of   the Laws does not constitute a valid excuse.  Illegal drugs and Controlled Substances: The use of illegal substances (including, but not limited to marijuana and its derivatives) and/or the illegal use of any controlled substances is strictly prohibited. Violation of this rule may result in the immediate and permanent discontinuation of any and all prescriptions being written by our practice. The use of any illegal substances is prohibited. Adopted CDC guidelines & recommendations: Target dosing levels will be at or below 60 MME/day. Use of benzodiazepines** is not recommended.  Exceptions: There are only two exceptions to the rule of not receiving pain medications from other Healthcare Providers. Exception #1 (Emergencies): In the event of an emergency (i.e.: accident requiring emergency care), you are allowed to receive additional pain medication. However, you are responsible for: As soon as you are able, call our office [phone], at any time of the day or night, and leave a message stating your name, the date and nature of the emergency, and the name and dose of the medication prescribed. In the event that your call is answered by a member of our staff, make sure to document and save the date, time, and the name of the person that took your information.  Exception #2 (Planned Surgery): In the event that you are scheduled by another doctor or dentist to have any type of surgery or procedure, you are allowed (for a period no longer than 30 days), to receive additional pain medication, for the acute post-op pain. However, in this case, you are responsible for picking up a copy of our "Post-op Pain Management for Surgeons" handout, and giving it to your surgeon or dentist. This document is available at our office, and  does not require an appointment to obtain it. Simply go to our office during business hours (Monday-Thursday from 8:00 AM to 4:00 PM) (Friday 8:00 AM to 12:00 Noon) or if you have a scheduled appointment with us, prior to your surgery, and ask for it by name. In addition, you are responsible for: calling our office [phone], at any time of the day or night, and leaving a message stating your name, name of your surgeon, type of surgery, and date of procedure or surgery. Failure to comply with your responsibilities may result in termination of therapy involving the controlled substances.  *Opioid medications include: morphine, codeine, oxycodone, oxymorphone, hydrocodone, hydromorphone, meperidine, tramadol, tapentadol, buprenorphine, fentanyl, methadone. **Benzodiazepine medications include: diazepam (Valium), alprazolam (Xanax), clonazepam (Klonopine), lorazepam (Ativan), clorazepate (Tranxene), chlordiazepoxide (Librium), estazolam (Prosom), oxazepam (Serax), temazepam (Restoril), triazolam (Halcion) (Last updated: 09/25/2020) ____________________________________________________________________________________________  ____________________________________________________________________________________________  Medication Recommendations and Reminders  Applies to: All patients receiving prescriptions (written and/or electronic).  Medication Rules & Regulations: These rules and regulations exist for your safety and that of others. They are not flexible and neither are we. Dismissing or ignoring them will be considered "non-compliance" with medication therapy, resulting in complete and irreversible termination of such therapy. (See document titled "Medication Rules" for more details.) In all conscience, because of safety reasons, we cannot continue providing a therapy where the patient does not follow instructions.  Pharmacy of record:  Definition: This is the pharmacy where your electronic  prescriptions will be sent.  We do not endorse any particular pharmacy, however, we have experienced problems with [facility] not securing enough medication supply for the community. We do not restrict you in your choice of pharmacy. However, once we write for your prescriptions, we will NOT be re-sending more prescriptions to fix restricted supply problems   created by your pharmacy, or your insurance.  The pharmacy listed in the electronic medical record should be the one where you want electronic prescriptions to be sent. If you choose to change pharmacy, simply notify our nursing staff.  Recommendations: Keep all of your pain medications in a safe place, under lock and key, even if you live alone. We will NOT replace lost, stolen, or damaged medication. After you fill your prescription, take 1 week's worth of pills and put them away in a safe place. You should keep a separate, properly labeled bottle for this purpose. The remainder should be kept in the original bottle. Use this as your primary supply, until it runs out. Once it's gone, then you know that you have 1 week's worth of medicine, and it is time to come in for a prescription refill. If you do this correctly, it is unlikely that you will ever run out of medicine. To make sure that the above recommendation works, it is very important that you make sure your medication refill appointments are scheduled at least 1 week before you run out of medicine. To do this in an effective manner, make sure that you do not leave the office without scheduling your next medication management appointment. Always ask the nursing staff to show you in your prescription , when your medication will be running out. Then arrange for the receptionist to get you a return appointment, at least 7 days before you run out of medicine. Do not wait until you have 1 or 2 pills left, to come in. This is very poor planning and does not take into consideration that we may need to  cancel appointments due to bad weather, sickness, or emergencies affecting our staff. DO NOT ACCEPT A "Partial Fill": If for any reason your pharmacy does not have enough pills/tablets to completely fill or refill your prescription, do not allow for a "partial fill". The law allows the pharmacy to complete that prescription within 72 hours, without requiring a new prescription. If they do not fill the rest of your prescription within those 72 hours, you will need a separate prescription to fill the remaining amount, which we will NOT provide. If the reason for the partial fill is your insurance, you will need to talk to the pharmacist about payment alternatives for the remaining tablets, but again, DO NOT ACCEPT A PARTIAL FILL, unless you can trust your pharmacist to obtain the remainder of the pills within 72 hours.  Prescription refills and/or changes in medication(s):  Prescription refills, and/or changes in dose or medication, will be conducted only during scheduled medication management appointments. (Applies to both, written and electronic prescriptions.) No refills on procedure days. No medication will be changed or started on procedure days. No changes, adjustments, and/or refills will be conducted on a procedure day. Doing so will interfere with the diagnostic portion of the procedure. No phone refills. No medications will be "called into the pharmacy". No Fax refills. No weekend refills. No Holliday refills. No after hours refills.  Remember:  Business hours are:  Monday to Thursday 8:00 AM to 4:00 PM Provider's Schedule: Kevyn Wengert, MD - Appointments are:  Medication management: Monday and Wednesday 8:00 AM to 4:00 PM Procedure day: Tuesday and Thursday 7:30 AM to 4:00 PM Bilal Lateef, MD - Appointments are:  Medication management: Tuesday and Thursday 8:00 AM to 4:00 PM Procedure day: Monday and Wednesday 7:30 AM to 4:00 PM (Last update:  05/17/2020) ____________________________________________________________________________________________  ____________________________________________________________________________________________  CBD (cannabidiol) WARNING    Applicable to: All individuals currently taking or considering taking CBD (cannabidiol) and, more important, all patients taking opioid analgesic controlled substances (pain medication). (Example: oxycodone; oxymorphone; hydrocodone; hydromorphone; morphine; methadone; tramadol; tapentadol; fentanyl; buprenorphine; butorphanol; dextromethorphan; meperidine; codeine; etc.)  Legal status: CBD remains a Schedule I drug prohibited for any use. CBD is illegal with one exception. In the [place], CBD has a limited Food and Drug Administration (FDA) approval for the treatment of two specific types of epilepsy disorders. Only one CBD product has been approved by the FDA for this purpose: "Epidiolex". FDA is aware that some companies are marketing products containing cannabis and cannabis-derived compounds in ways that violate the Federal Food, Drug and Cosmetic Act (FD&C Act) and that may put the health and safety of consumers at risk. The FDA, a Federal agency, has not enforced the CBD status since 2018.   Legality: Some manufacturers ship CBD products nationally, which is illegal. Often such products are sold online and are therefore available throughout the country. CBD is openly sold in head shops and health food stores in some states where such sales have not been explicitly legalized. Selling unapproved products with unsubstantiated therapeutic claims is not only a violation of the law, but also can put patients at risk, as these products have not been proven to be safe or effective. Federal illegality makes it difficult to conduct research on CBD.  Reference: "FDA Regulation of Cannabis and Cannabis-Derived Products, Including Cannabidiol (CBD)" -  https://www.fda.gov/news-events/public-health-focus/fda-regulation-cannabis-and-cannabis-derived-products-including-cannabidiol-cbd  Warning: CBD is not FDA approved and has not undergo the same manufacturing controls as prescription drugs.  This means that the purity and safety of available CBD may be questionable. Most of the time, despite manufacturer's claims, it is contaminated with THC (delta-9-tetrahydrocannabinol - the chemical in marijuana responsible for the "HIGH").  When this is the case, the THC contaminant will trigger a positive urine drug screen (UDS) test for Marijuana (carboxy-THC). Because a positive UDS for any illicit substance is a violation of our medication agreement, your opioid analgesics (pain medicine) may be permanently discontinued.  MORE ABOUT CBD  General Information: CBD  is a derivative of the Marijuana (cannabis sativa) plant discovered in 1940. It is one of the 113 identified substances found in Marijuana. It accounts for up to 40% of the plant's extract. As of 2018, preliminary clinical studies on CBD included research for the treatment of anxiety, movement disorders, and pain. CBD is available and consumed in multiple forms, including inhalation of smoke or vapor, as an aerosol spray, and by mouth. It may be supplied as an oil containing CBD, capsules, dried cannabis, or as a liquid solution. CBD is thought not to be as psychoactive as THC (delta-9-tetrahydrocannabinol - the chemical in marijuana responsible for the "HIGH"). Studies suggest that CBD may interact with different biological target receptors in the body, including cannabinoid and other neurotransmitter receptors. As of 2018 the mechanism of action for its biological effects has not been determined.  Side-effects  Adverse reactions: Dry mouth, diarrhea, decreased appetite, fatigue, drowsiness, malaise, weakness, sleep disturbances, and others.  Drug interactions: CBC may interact with other medications  such as blood-thinners. (Last update: 06/03/2020) ____________________________________________________________________________________________  ____________________________________________________________________________________________  Drug Holidays (Slow)  What is a "Drug Holiday"? Drug Holiday: is the name given to the period of time during which a patient stops taking a medication(s) for the purpose of eliminating tolerance to the drug.  Benefits Improved effectiveness of opioids. Decreased opioid dose needed to achieve benefits. Improved pain with lesser dose.    What is tolerance? Tolerance: is the progressive decreased in effectiveness of a drug due to its repetitive use. With repetitive use, the body gets use to the medication and as a consequence, it loses its effectiveness. This is a common problem seen with opioid pain medications. As a result, a larger dose of the drug is needed to achieve the same effect that used to be obtained with a smaller dose.  How long should a "Drug Holiday" last? You should stay off of the pain medicine for at least 14 consecutive days. (2 weeks)  Should I stop the medicine "cold [place]"? No. You should always coordinate with your Pain Specialist so that he/she can provide you with the correct medication dose to make the transition as smoothly as possible.  How do I stop the medicine? Slowly. You will be instructed to decrease the daily amount of pills that you take by one (1) pill every seven (7) days. This is called a "slow downward taper" of your dose. For example: if you normally take four (4) pills per day, you will be asked to drop this dose to three (3) pills per day for seven (7) days, then to two (2) pills per day for seven (7) days, then to one (1) per day for seven (7) days, and at the end of those last seven (7) days, this is when the "Drug Holiday" would start.   Will I have withdrawals? By doing a "slow downward taper" like this one, it  is unlikely that you will experience any significant withdrawal symptoms. Typically, what triggers withdrawals is the sudden stop of a high dose opioid therapy. Withdrawals can usually be avoided by slowly decreasing the dose over a prolonged period of time. If you do not follow these instructions and decide to stop your medication abruptly, withdrawals may be possible.  What are withdrawals? Withdrawals: refers to the wide range of symptoms that occur after stopping or dramatically reducing opiate drugs after heavy and prolonged use. Withdrawal symptoms do not occur to patients that use low dose opioids, or those who take the medication sporadically. Contrary to benzodiazepine (example: Valium, Xanax, etc.) or alcohol withdrawals ("Delirium Tremens"), opioid withdrawals are not lethal. Withdrawals are the physical manifestation of the body getting rid of the excess receptors.  Expected Symptoms Early symptoms of withdrawal may include: Agitation Anxiety Muscle aches Increased tearing Insomnia Runny nose Sweating Yawning  Late symptoms of withdrawal may include: Abdominal cramping Diarrhea Dilated pupils Goose bumps Nausea Vomiting  Will I experience withdrawals? Due to the slow nature of the taper, it is very unlikely that you will experience any.  What is a slow taper? Taper: refers to the gradual decrease in dose.  (Last update: 05/17/2020) ____________________________________________________________________________________________   ____________________________________________________________________________________________  Preparing for Procedure with Sedation  Procedure appointments are limited to planned procedures: No Prescription Refills. No disability issues will be discussed. No medication changes will be discussed.  Instructions: Oral Intake: Do not eat or drink anything for at least 8 hours prior to your procedure. (Exception: Blood Pressure Medication. See  below.) Transportation: Unless otherwise stated by your physician, you may drive yourself after the procedure. Blood Pressure Medicine: Do not forget to take your blood pressure medicine with a sip of water the morning of the procedure. If your Diastolic (lower reading)is above 100 mmHg, elective cases will be cancelled/rescheduled. Blood thinners: These will need to be stopped for procedures. Notify our staff if you are taking any blood thinners. Depending on which one you take,  there will be specific instructions on how and when to stop it. Diabetics on insulin: Notify the staff so that you can be scheduled 1st case in the morning. If your diabetes requires high dose insulin, take only  of your normal insulin dose the morning of the procedure and notify the staff that you have done so. Preventing infections: Shower with an antibacterial soap the morning of your procedure. Build-up your immune system: Take 1000 mg of Vitamin C with every meal (3 times a day) the day prior to your procedure. Antibiotics: Inform the staff if you have a condition or reason that requires you to take antibiotics before dental procedures. Pregnancy: If you are pregnant, call and cancel the procedure. Sickness: If you have a cold, fever, or any active infections, call and cancel the procedure. Arrival: You must be in the facility at least 30 minutes prior to your scheduled procedure. Children: Do not bring children with you. Dress appropriately: Bring dark clothing that you would not mind if they get stained. Valuables: Do not bring any jewelry or valuables.  Reasons to call and reschedule or cancel your procedure: (Following these recommendations will minimize the risk of a serious complication.) Surgeries: Avoid having procedures within 2 weeks of any surgery. (Avoid for 2 weeks before or after any surgery). Flu Shots: Avoid having procedures within 2 weeks of a flu shots or . (Avoid for 2 weeks before or after  immunizations). Barium: Avoid having a procedure within 7-10 days after having had a radiological study involving the use of radiological contrast. (Myelograms, Barium swallow or enema study). Heart attacks: Avoid any elective procedures or surgeries for the initial 6 months after a "Myocardial Infarction" (Heart Attack). Blood thinners: It is imperative that you stop these medications before procedures. Let us know if you if you take any blood thinner.  Infection: Avoid procedures during or within two weeks of an infection (including chest colds or gastrointestinal problems). Symptoms associated with infections include: Localized redness, fever, chills, night sweats or profuse sweating, burning sensation when voiding, cough, congestion, stuffiness, runny nose, sore throat, diarrhea, nausea, vomiting, cold or Flu symptoms, recent or current infections. It is specially important if the infection is over the area that we intend to treat. Heart and lung problems: Symptoms that may suggest an active cardiopulmonary problem include: cough, chest pain, breathing difficulties or shortness of breath, dizziness, ankle swelling, uncontrolled high or unusually low blood pressure, and/or palpitations. If you are experiencing any of these symptoms, cancel your procedure and contact your primary care physician for an evaluation.  Remember:  Regular Business hours are:  Monday to Thursday 8:00 AM to 4:00 PM  Provider's Schedule: Milinda Pointer, MD:  Procedure days: Tuesday and Thursday 7:30 AM to 4:00 PM  Gillis Santa, MD:  Procedure days: Monday and Wednesday 7:30 AM to 4:00 PM ____________________________________________________________________________________________  ____________________________________________________________________________________________  General Risks and Possible Complications  Patient Responsibilities: It is important that you read this as it is part of your informed consent. It  is our duty to inform you of the risks and possible complications associated with treatments offered to you. It is your responsibility as a patient to read this and to ask questions about anything that is not clear or that you believe was not covered in this document.  Patient's Rights: You have the right to refuse treatment. You also have the right to change your mind, even after initially having agreed to have the treatment done. However, under this last option, if you  wait until the last second to change your mind, you may be charged for the materials used up to that point.  Introduction: Medicine is not an Chief Strategy Officer. Everything in Medicine, including the lack of treatment(s), carries the potential for danger, harm, or loss (which is by definition: Risk). In Medicine, a complication is a secondary problem, condition, or disease that can aggravate an already existing one. All treatments carry the risk of possible complications. The fact that a side effects or complications occurs, does not imply that the treatment was conducted incorrectly. It must be clearly understood that these can happen even when everything is done following the highest safety standards.  No treatment: You can choose not to proceed with the proposed treatment alternative. The "PRO(s)" would include: avoiding the risk of complications associated with the therapy. The "CON(s)" would include: not getting any of the treatment benefits. These benefits fall under one of three categories: diagnostic; therapeutic; and/or palliative. Diagnostic benefits include: getting information which can ultimately lead to improvement of the disease or symptom(s). Therapeutic benefits are those associated with the successful treatment of the disease. Finally, palliative benefits are those related to the decrease of the primary symptoms, without necessarily curing the condition (example: decreasing the pain from a flare-up of a chronic condition, such as  incurable terminal cancer).  General Risks and Complications: These are associated to most interventional treatments. They can occur alone, or in combination. They fall under one of the following six (6) categories: no benefit or worsening of symptoms; bleeding; infection; nerve damage; allergic reactions; and/or death. No benefits or worsening of symptoms: In Medicine there are no guarantees, only probabilities. No healthcare provider can ever guarantee that a medical treatment will work, they can only state the probability that it may. Furthermore, there is always the possibility that the condition may worsen, either directly, or indirectly, as a consequence of the treatment. Bleeding: This is more common if the patient is taking a blood thinner, either prescription or over the counter (example: Goody Powders, Fish oil, Aspirin, Garlic, etc.), or if suffering a condition associated with impaired coagulation (example: Hemophilia, cirrhosis of the liver, low platelet counts, etc.). However, even if you do not have one on these, it can still happen. If you have any of these conditions, or take one of these drugs, make sure to notify your treating physician. Infection: This is more common in patients with a compromised immune system, either due to disease (example: diabetes, cancer, human immunodeficiency virus [HIV], etc.), or due to medications or treatments (example: therapies used to treat cancer and rheumatological diseases). However, even if you do not have one on these, it can still happen. If you have any of these conditions, or take one of these drugs, make sure to notify your treating physician. Nerve Damage: This is more common when the treatment is an invasive one, but it can also happen with the use of medications, such as those used in the treatment of cancer. The damage can occur to small secondary nerves, or to large primary ones, such as those in the spinal cord and brain. This damage may be  temporary or permanent and it may lead to impairments that can range from temporary numbness to permanent paralysis and/or brain death. Allergic Reactions: Any time a substance or material comes in contact with our body, there is the possibility of an allergic reaction. These can range from a mild skin rash (contact dermatitis) to a severe systemic reaction (anaphylactic reaction), which can result  in death. Death: In general, any medical intervention can result in death, most of the time due to an unforeseen complication. ____________________________________________________________________________________________

## 2021-04-16 NOTE — Progress Notes (Signed)
Nursing Pain Medication Assessment:  Safety precautions to be maintained throughout the outpatient stay will include: orient to surroundings, keep bed in low position, maintain call bell within reach at all times, provide assistance with transfer out of bed and ambulation.  Medication Inspection Compliance: Pill count conducted under aseptic conditions, in front of the patient. Neither the pills nor the bottle was removed from the patient's sight at any time. Once count was completed pills were immediately returned to the patient in their original bottle.  Medication #1: Hydrocodone/APAP Pill/Patch Count:  7 of 21 pills remain Pill/Patch Appearance: Markings consistent with prescribed medication Bottle Appearance: Standard pharmacy container. Clearly labeled. Filled Date: 05 / 31 / 2022 Last Medication intake:  Day before yesterday  Medication #2: Hydrocodone/APAP Pill/Patch Count:  6 of 21 pills remain Pill/Patch Appearance: Markings consistent with prescribed medication Bottle Appearance: Standard pharmacy container. Clearly labeled. Filled Date: 05 / 14 /  2022 Last Medication intake:  Day before yesterday

## 2021-04-18 ENCOUNTER — Telehealth: Payer: Self-pay

## 2021-04-18 NOTE — Telephone Encounter (Signed)
Pt would like someone to review her MRI with her prior to her Procedure of 04/24/21

## 2021-04-18 NOTE — Telephone Encounter (Signed)
This is something that Dr Lowella Dandy will need to discuss with her.

## 2021-04-23 DIAGNOSIS — G588 Other specified mononeuropathies: Secondary | ICD-10-CM | POA: Insufficient documentation

## 2021-04-23 DIAGNOSIS — M545 Low back pain, unspecified: Secondary | ICD-10-CM | POA: Insufficient documentation

## 2021-04-23 NOTE — Progress Notes (Signed)
PROVIDER NOTE: Information contained herein reflects review and annotations entered in association with encounter. Interpretation of such information and data should be left to medically-trained personnel. Information provided to patient can be located elsewhere in the medical record under "Patient Instructions". Document created using STT-dictation technology, any transcriptional errors that may result from process are unintentional.    Patient: Karen Edwards  Service Category: Procedure  Provider: Gaspar Cola, MD  DOB: Feb 14, 1966  DOS: 04/24/2021  Location: [facility]  MRN: [id number]  Setting: Ambulatory - outpatient  Referring Provider: Pleas Koch, NP  Type: Established Patient  Specialty: Interventional Pain Management  PCP: Pleas Koch, NP   Primary Reason for Visit: Interventional Pain Management Treatment. CC: Back Pain (lower)  Procedure:          Anesthesia, Analgesia, Anxiolysis:  Type: Superior Cluneal Nerve Block(s)          Primary Purpose: Diagnostic Region: Posterolateral Lumbosacral Spine Level: L2 & L3  Dorsal Rami. Injecting these levels blocks origin of the Superior Cluneal Nerve. Laterality: Left  Type: Minimal (Conscious) Sedation combined with Local Anesthesia Indication(s): Analgesia and Anxiety Route: Intravenous (IV) IV Access: Secured Sedation: Meaningful verbal contact was maintained at all times during the procedure  Local Anesthetic: Lidocaine 1-2%  Position: Prone   Indications: 1. Pain due to any device, implant or graft (SCS battery) (Left PSIS area)   2. Presence of neurostimulator (Thoracolumbar)   3. DDD (degenerative disc disease), lumbar   4. Spondylosis without myelopathy or radiculopathy, lumbosacral region   5. Chronic low back pain (Left) w/o sciatica (implant pain)   6. Cluneal neuropathy (Left)    Pain Score: Pre-procedure: 8 /10 Post-procedure: 0-No pain/10   RTCB: 07/15/2021 Nonopioids transfer  08/23/2020: Lidocaine 5% ointment and Robaxin  Pre-op H&P Assessment:  Karen Edwards is a 55 y.o. (year old), female patient, seen today for interventional treatment. She  has a past surgical history that includes Cesarean section; Ankle surgery (Right, 03/02/2014); Hemorrhoid surgery (N/A, 06/16/2015); Cervical spine surgery (02/21/2016); Cesarean section with bilateral tubal ligation (1990); Colonoscopy (04/19/2015); Hysteroscopy with D & C (12/24/2013); Colonoscopy with propofol (N/A, 05/18/2018); Esophagogastroduodenoscopy (egd) with propofol (N/A, 05/18/2018); Spinal cord stimulator insertion (N/A, 06/18/2018); Tubal ligation; and Lumbar spinal cord simulator revision (N/A, 03/14/2020). Karen Edwards has a current medication list which includes the following prescription(s): acetaminophen, biotin, buspirone, fluoxetine, gabapentin, fish oil, ondansetron, oxycodone, [START ON 05/16/2021] oxycodone, [START ON 06/15/2021] oxycodone, scopolamine, tizanidine, trazodone, and turmeric, and the following Facility-Administered Medications: midazolam. Her primarily concern today is the Back Pain (lower)  Initial Vital Signs:  Pulse/HCG Rate: 71ECG Heart Rate: 68 Temp: (!) 97 F (36.1 C) Resp: 18 BP: 112/72 SpO2: 99 %  BMI: Estimated body mass index is 27.4 kg/m as calculated from the following:   Height as of this encounter: 5\' 1"  (1.549 m).   Weight as of this encounter: 145 lb (65.8 kg).  Risk Assessment: Allergies: Reviewed. She has No Known Allergies.  Allergy Precautions: None required Coagulopathies: Reviewed. None identified.  Blood-thinner therapy: None at this time Active Infection(s): Reviewed. None identified. Karen Edwards is afebrile  Site Confirmation: Karen Edwards was asked to confirm the procedure and laterality before marking the site Procedure checklist: Completed Consent: Before the procedure and under the influence of no sedative(s), amnesic(s), or anxiolytics, the patient was  informed of the treatment options, risks and possible complications. To fulfill our ethical and legal obligations, as recommended by the American Medical Association's Code of Ethics,  I have informed the patient of my clinical impression; the nature and purpose of the treatment or procedure; the risks, benefits, and possible complications of the intervention; the alternatives, including doing nothing; the risk(s) and benefit(s) of the alternative treatment(s) or procedure(s); and the risk(s) and benefit(s) of doing nothing. The patient was provided information about the general risks and possible complications associated with the procedure. These may include, but are not limited to: failure to achieve desired goals, infection, bleeding, organ or nerve damage, allergic reactions, paralysis, and death. In addition, the patient was informed of those risks and complications associated to Spine-related procedures, such as failure to decrease pain; infection (i.e.: Meningitis, epidural or intraspinal abscess); bleeding (i.e.: epidural hematoma, subarachnoid hemorrhage, or any other type of intraspinal or peri-dural bleeding); organ or nerve damage (i.e.: Any type of peripheral nerve, nerve root, or spinal cord injury) with subsequent damage to sensory, motor, and/or autonomic systems, resulting in permanent pain, numbness, and/or weakness of one or several areas of the body; allergic reactions; (i.e.: anaphylactic reaction); and/or death. Furthermore, the patient was informed of those risks and complications associated with the medications. These include, but are not limited to: allergic reactions (i.e.: anaphylactic or anaphylactoid reaction(s)); adrenal axis suppression; blood sugar elevation that in diabetics may result in ketoacidosis or comma; water retention that in patients with history of congestive heart failure may result in shortness of breath, pulmonary edema, and decompensation with resultant heart  failure; weight gain; swelling or edema; medication-induced neural toxicity; particulate matter embolism and blood vessel occlusion with resultant organ, and/or nervous system infarction; and/or aseptic necrosis of one or more joints. Finally, the patient was informed that Medicine is not an exact science; therefore, there is also the possibility of unforeseen or unpredictable risks and/or possible complications that may result in a catastrophic outcome. The patient indicated having understood very clearly. We have given the patient no guarantees and we have made no promises. Enough time was given to the patient to ask questions, all of which were answered to the patient's satisfaction. Ms. Hon has indicated that she wanted to continue with the procedure. Attestation: I, the ordering provider, attest that I have discussed with the patient the benefits, risks, side-effects, alternatives, likelihood of achieving goals, and potential problems during recovery for the procedure that I have provided informed consent. Date  Time: 04/24/2021  8:44 AM  Pre-Procedure Preparation:  Monitoring: As per clinic protocol. Respiration, ETCO2, SpO2, BP, heart rate and rhythm monitor placed and checked for adequate function Safety Precautions: Patient was assessed for positional comfort and pressure points before starting the procedure. Time-out: I initiated and conducted the "Time-out" before starting the procedure, as per protocol. The patient was asked to participate by confirming the accuracy of the "Time Out" information. Verification of the correct person, site, and procedure were performed and confirmed by me, the nursing staff, and the patient. "Time-out" conducted as per Joint Commission's Universal Protocol (UP.01.01.01). Time: 0928  Description of Procedure:          Laterality: Left Levels:  L2 & L3 Dorsal Rami Level(s) Area Prepped: Posterior Lumbosacral Region DuraPrep (Iodine Povacrylex [0.7%  available iodine] and Isopropyl Alcohol, 74% w/w) Safety Precautions: Aspiration looking for blood return was conducted prior to all injections. At no point did we inject any substances, as a needle was being advanced. Before injecting, the patient was told to immediately notify me if she was experiencing any new onset of "ringing in the ears, or metallic taste in the mouth".  No attempts were made at seeking any paresthesias. Safe injection practices and needle disposal techniques used. Medications properly checked for expiration dates. SDV (single dose vial) medications used. After the completion of the procedure, all disposable equipment used was discarded in the proper designated medical waste containers. Local Anesthesia: Protocol guidelines were followed. The patient was positioned over the fluoroscopy table. The area was prepped in the usual manner. The time-out was completed. The target area was identified using fluoroscopy. A 12-in long, straight, sterile hemostat was used with fluoroscopic guidance to locate the targets for each level blocked. Once located, the skin was marked with an approved surgical skin marker. Once all sites were marked, the skin (epidermis, dermis, and hypodermis), as well as deeper tissues (fat, connective tissue and muscle) were infiltrated with a small amount of a short-acting local anesthetic, loaded on a 10cc syringe with a 25G, 1.5-in  Needle. An appropriate amount of time was allowed for local anesthetics to take effect before proceeding to the next step. Local Anesthetic: Lidocaine 2.0% The unused portion of the local anesthetic was discarded in the proper designated containers. Technical explanation of process:  L2 Medial Branch Nerve Block (MBB): The target area for the L2 medial branch is at the junction of the postero-lateral aspect of the superior articular process and the superior, posterior, and medial edge of the transverse process of L3. Under fluoroscopic  guidance, a Quincke needle was inserted until contact was made with os over the superior postero-lateral aspect of the pedicular shadow (target area). After negative aspiration for blood, 0.5 mL of the nerve block solution was injected without difficulty or complication. The needle was removed intact. L3 Medial Branch Nerve Block (MBB): The target area for the L3 medial branch is at the junction of the postero-lateral aspect of the superior articular process and the superior, posterior, and medial edge of the transverse process of L4. Under fluoroscopic guidance, a Quincke needle was inserted until contact was made with os over the superior postero-lateral aspect of the pedicular shadow (target area). After negative aspiration for blood, 0.5 mL of the nerve block solution was injected without difficulty or complication. The needle was removed intact. Nerve block solution: 0.2% PF-Ropivacaine + Triamcinolone (40 mg/mL) diluted to a final concentration of 4 mg of Triamcinolone/mL of Ropivacaine The unused portion of the solution was discarded in the proper designated containers. Procedural Needles: 22-gauge, 3.5-inch, Quincke needles used for all levels.  Once the entire procedure was completed, the treated area was cleaned, making sure to leave some of the prepping solution back to take advantage of its long term bactericidal properties.  Vitals:   04/24/21 0932 04/24/21 0939 04/24/21 0949 04/24/21 0959  BP: 114/67 104/68 103/67 120/65  Pulse:      Resp: 16 16 16 16   Temp:  (!) 97.2 F (36.2 C)  (!) 97.2 F (36.2 C)  TempSrc:      SpO2: 95% 93% 96% 96%  Weight:      Height:         Start Time: 0928 hrs. End Time: 0932 hrs.  Imaging Guidance (Spinal):          Type of Imaging Technique: Fluoroscopy Guidance (Spinal) Indication(s): Assistance in needle guidance and placement for procedures requiring needle placement in or near specific anatomical locations not easily accessible without such  assistance. Exposure Time: Please see nurses notes. Contrast: None used. Fluoroscopic Guidance: I was personally present during the use of fluoroscopy. "Tunnel Vision Technique" used to obtain the  best possible view of the target area. Parallax error corrected before commencing the procedure. "Direction-depth-direction" technique used to introduce the needle under continuous pulsed fluoroscopy. Once target was reached, antero-posterior, oblique, and lateral fluoroscopic projection used confirm needle placement in all planes. Images permanently stored in EMR. Interpretation: No contrast injected. I personally interpreted the imaging intraoperatively. Adequate needle placement confirmed in multiple planes. Permanent images saved into the patient's record.  Antibiotic Prophylaxis:   Anti-infectives (From admission, onward)    None      Indication(s): None identified  Post-operative Assessment:  Post-procedure Vital Signs:  Pulse/HCG Rate: 7161 Temp: (!) 97.2 F (36.2 C) Resp: 16 BP: 120/65 SpO2: 96 %  EBL: None  Complications: No immediate post-treatment complications observed by team, or reported by patient.  Note: The patient tolerated the entire procedure well. A repeat set of vitals were taken after the procedure and the patient was kept under observation following institutional policy, for this type of procedure. Post-procedural neurological assessment was performed, showing return to baseline, prior to discharge. The patient was provided with post-procedure discharge instructions, including a section on how to identify potential problems. Should any problems arise concerning this procedure, the patient was given instructions to immediately contact us, at any time, without hesitation. In any case, we plan to contact the patient by telephone for a follow-up status report regarding this interventional procedure.  Comments:  No additional relevant information.  Plan of Care  Orders:   Orders Placed This Encounter  Procedures   LUMBAR FACET(MEDIAL BRANCH NERVE BLOCK) MBNB    Scheduling Instructions:     Procedure: Superior cluneal nerve block     Side: Left-sided     Level: L2, L3,Dorsal rami     Sedation: Patient's choice.     Timeframe: Today    Order Specific Question:   Where will this procedure be performed?    Answer:   [facility]   DG [facility] C-ARM 1-60 MIN NO REPORT    Intraoperative interpretation by procedural physician at [place].    Standing Status:   Standing    Number of Occurrences:   1    Order Specific Question:   Reason for exam:    Answer:   Assistance in needle guidance and placement for procedures requiring needle placement in or near specific anatomical locations not easily accessible without such assistance.   Informed Consent Details: Physician/Practitioner Attestation; Transcribe to consent form and obtain patient signature    Nursing Order: Transcribe to consent form and obtain patient signature. Note: Always confirm laterality of pain with Ms. Tye Savoy, before procedure.    Order Specific Question:   Physician/Practitioner attestation of informed consent for procedure/surgical case    Answer:   I, the physician/practitioner, attest that I have discussed with the patient the benefits, risks, side effects, alternatives, likelihood of achieving goals and potential problems during recovery for the procedure that I have provided informed consent.    Order Specific Question:   Procedure    Answer:   Lumbar Facet Block  under fluoroscopic guidance    Order Specific Question:   Physician/Practitioner performing the procedure    Answer:   Cherie Lasalle A. Dossie Arbour MD    Order Specific Question:   Indication/Reason    Answer:   Low Back Pain, with our without leg pain, due to Facet Joint Arthralgia (Joint Pain) Spondylosis (Arthritis of the Spine), without myelopathy or radiculopathy (Nerve Damage).   Provide equipment /  supplies at bedside    "  Block Tray" (Disposable  single use) Needle type: SpinalSpinal Amount/quantity: 2 Size: Regular (3.5-inch) Gauge: 22G    Standing Status:   Standing    Number of Occurrences:   1    Order Specific Question:   Specify    Answer:   Block Tray    Chronic Opioid Analgesic:  Oxycodone IR 5 mg, 1 tab PO QD (5 mg/day of oxycodone) PRN MME/day: 7.5 mg/day.   Medications ordered for procedure: Meds ordered this encounter  Medications   lidocaine (XYLOCAINE) 2 % (with pres) injection 400 mg   lactated ringers infusion 1,000 mL   midazolam (VERSED) 5 MG/5ML injection 1-2 mg    Make sure Flumazenil is available in the pyxis when using this medication. If oversedation occurs, administer 0.2 mg IV over 15 sec. If after 45 sec no response, administer 0.2 mg again over 1 min; may repeat at 1 min intervals; not to exceed 4 doses (1 mg)   ropivacaine (PF) 2 mg/mL (0.2%) (NAROPIN) injection 9 mL   triamcinolone acetonide (KENALOG-40) injection 40 mg    Medications administered: We administered lidocaine, lactated ringers, midazolam, ropivacaine (PF) 2 mg/mL (0.2%), and triamcinolone acetonide.  See the medical record for exact dosing, route, and time of administration.  Follow-up plan:   Return in about 2 weeks (around 05/08/2021) for procedure day (afternoon VV) (PPE).      Interventional Therapies  Risk  Complexity Considerations:   WNL   Planned  Pending:   Therapeutic right lumbar facet RFA #2 (02/01/2021)    Under consideration:   Diagnostic left L2 and L3 MBB (superior cluneal nerve) #1  Possible left superior cluneal nerve RFA    Completed:   Therapeutic right cervical ESI x1 (08/24/2020) Diagnostic bilateral GONB x1 (08/19/2019) Palliative right cervical facet MBB x3 (07/23/2018)  Palliative left cervical facet MBB x4 (04/01/2019)  Palliative right cervical facet RFA x1 (09/17/2018) Palliative left cervical facet RFA x1 (05/04/2019) Palliative right L2-3  interlaminar LESI x2  Palliative right L1 TFESI x1  Bilateral lumbar spinal cord stimulator trial (done) (05/14/2018) (by me)  Bilateral lumbar spinal cord stimulator implant (09/28/2019) (by me)  Bilateral spinal cord stimulator revision (03/14/2020) (by me)  Diagnostic bilateral lumbar facet MBB x3  Diagnostic bilateral SI joint block x2  Palliative left lumbar facet RFA x2 (03/27/2021)  Therapeutic right lumbar facet RFA x2 (02/01/2021)    Therapeutic  Palliative (PRN) options:   Therapeutic right cervical ESI #2  Diagnostic bilateral GONB #2  Palliative bilateral cervical facet blocks #5  Palliative right cervical facet RFA #2  Palliative left cervical facet RFA #2  Palliative right L2-3 interlaminar LESI #3  Palliative right L1 TFESI #2  Diagnostic bilateral lumbar facet block #4  Diagnostic bilateral SI joint block #3  Palliative left lumbar facet RFA #2  Palliative right lumbar facet RFA #3     Recent Visits Date Type Provider Dept  04/16/21 Office Visit Milinda Pointer, MD [facility]  04/03/21 Office Visit Milinda Pointer, MD [facility]  03/27/21 Procedure visit Milinda Pointer, MD [facility]  02/26/21 Office Visit Milinda Pointer, MD [facility]  02/01/21 Procedure visit Milinda Pointer, MD [facility]  Showing recent visits within past 90 days and meeting all other requirements Today's Visits Date Type Provider Dept  04/24/21 Procedure visit Milinda Pointer, MD [facility]  Showing today's visits and meeting all other requirements Future Appointments Date Type Provider Dept  05/14/21 Appointment Milinda Pointer, MD [facility]  07/11/21 Appointment Milinda Pointer, MD [facility]  Showing future appointments within next 90 days and meeting all other requirements Disposition: Discharge home  Discharge (Date  Time): 04/24/2021; 1001 hrs.   Primary Care Physician:  Pleas Koch, NP Location: [facility] Note by: Gaspar Cola, MD Date: 04/24/2021; Time: 6:56 PM  Disclaimer:  Medicine is not an Chief Strategy Officer. The only guarantee in medicine is that nothing is guaranteed. It is important to note that the decision to proceed with this intervention was based on the information collected from the patient. The Data and conclusions were drawn from the patient's questionnaire, the interview, and the physical examination. Because the information was provided in large part by the patient, it cannot be guaranteed that it has not been purposely or unconsciously manipulated. Every effort has been made to obtain as much relevant data as possible for this evaluation. It is important to note that the conclusions that lead to this procedure are derived in large part from the available data. Always take into account that the treatment will also be dependent on availability of resources and existing treatment guidelines, considered by other Pain Management Practitioners as being common knowledge and practice, at the time of the intervention. For Medico-Legal purposes, it is also important to point out that variation in procedural techniques and pharmacological choices are the acceptable norm. The indications, contraindications, technique, and results of the above procedure should only be interpreted and judged by a Board-Certified Interventional Pain Specialist with extensive familiarity and expertise in the same exact procedure and technique.

## 2021-04-24 ENCOUNTER — Other Ambulatory Visit: Payer: Self-pay

## 2021-04-24 ENCOUNTER — Ambulatory Visit (HOSPITAL_BASED_OUTPATIENT_CLINIC_OR_DEPARTMENT_OTHER): Payer: Medicare PPO | Admitting: Pain Medicine

## 2021-04-24 ENCOUNTER — Ambulatory Visit
Admission: RE | Admit: 2021-04-24 | Discharge: 2021-04-24 | Disposition: A | Discharge status: Home / Self Care | Payer: Medicare PPO | Source: Ambulatory Visit | Attending: Pain Medicine | Admitting: Pain Medicine

## 2021-04-24 VITALS — BP 120/65 | HR 71 | Temp 97.2°F | Resp 16 | Ht 61.0 in | Wt 145.0 lb

## 2021-04-24 DIAGNOSIS — M5136 Other intervertebral disc degeneration, lumbar region: Secondary | ICD-10-CM | POA: Insufficient documentation

## 2021-04-24 DIAGNOSIS — M47817 Spondylosis without myelopathy or radiculopathy, lumbosacral region: Secondary | ICD-10-CM | POA: Diagnosis not present

## 2021-04-24 DIAGNOSIS — Z9682 Presence of neurostimulator: Secondary | ICD-10-CM

## 2021-04-24 DIAGNOSIS — G8929 Other chronic pain: Secondary | ICD-10-CM | POA: Insufficient documentation

## 2021-04-24 DIAGNOSIS — G588 Other specified mononeuropathies: Secondary | ICD-10-CM

## 2021-04-24 DIAGNOSIS — T85848S Pain due to other internal prosthetic devices, implants and grafts, sequela: Secondary | ICD-10-CM

## 2021-04-24 DIAGNOSIS — M545 Low back pain, unspecified: Secondary | ICD-10-CM | POA: Insufficient documentation

## 2021-04-24 LAB — TOXASSURE SELECT 13 (MW), URINE

## 2021-04-24 MED ORDER — LACTATED RINGERS IV SOLN
1000.0000 mL | Freq: Once | INTRAVENOUS | Status: AC
  Administered 2021-04-24: 1000 mL via INTRAVENOUS

## 2021-04-24 MED ORDER — TRIAMCINOLONE ACETONIDE 40 MG/ML IJ SUSP
40.0000 mg | Freq: Once | INTRAMUSCULAR | Status: AC
Start: 2021-04-24 — End: 2021-04-24
  Administered 2021-04-24: 40 mg

## 2021-04-24 MED ORDER — LIDOCAINE HCL 2 % IJ SOLN
INTRAMUSCULAR | Status: AC
  Filled 2021-04-24: qty 10

## 2021-04-24 MED ORDER — ROPIVACAINE HCL 2 MG/ML IJ SOLN
9.0000 mL | Freq: Once | INTRAMUSCULAR | Status: AC
Start: 2021-04-24 — End: 2021-04-24
  Administered 2021-04-24: 9 mL via PERINEURAL

## 2021-04-24 MED ORDER — MIDAZOLAM HCL 5 MG/5ML IJ SOLN
INTRAMUSCULAR | Status: AC
  Filled 2021-04-24: qty 5

## 2021-04-24 MED ORDER — ROPIVACAINE HCL 2 MG/ML IJ SOLN
INTRAMUSCULAR | Status: AC
  Filled 2021-04-24: qty 20

## 2021-04-24 MED ORDER — LIDOCAINE HCL 2 % IJ SOLN
20.0000 mL | Freq: Once | INTRAMUSCULAR | Status: AC
Start: 2021-04-24 — End: 2021-04-24
  Administered 2021-04-24: 200 mg

## 2021-04-24 MED ORDER — TRIAMCINOLONE ACETONIDE 40 MG/ML IJ SUSP
INTRAMUSCULAR | Status: AC
  Filled 2021-04-24: qty 1

## 2021-04-24 MED ORDER — MIDAZOLAM HCL 5 MG/5ML IJ SOLN
1.0000 mg | INTRAMUSCULAR | Status: DC | PRN
  Administered 2021-04-24: 3 mg via INTRAVENOUS

## 2021-04-24 NOTE — Patient Instructions (Signed)

## 2021-04-25 ENCOUNTER — Telehealth: Payer: Self-pay | Admitting: *Deleted

## 2021-04-25 NOTE — Telephone Encounter (Signed)
Denies any post procedure issues. 

## 2021-05-06 ENCOUNTER — Other Ambulatory Visit: Payer: Self-pay | Admitting: Primary Care

## 2021-05-06 DIAGNOSIS — M7918 Myalgia, other site: Secondary | ICD-10-CM

## 2021-05-06 DIAGNOSIS — G8929 Other chronic pain: Secondary | ICD-10-CM

## 2021-05-06 DIAGNOSIS — F411 Generalized anxiety disorder: Secondary | ICD-10-CM

## 2021-05-06 NOTE — Telephone Encounter (Signed)
Does she like the tizanidine muscle relaxer?  How many times during the day does she take?  Does she want to continue tizanidine muscle relaxer?

## 2021-05-07 ENCOUNTER — Telehealth: Payer: Self-pay | Admitting: Pain Medicine

## 2021-05-07 NOTE — Telephone Encounter (Signed)
Patient is having a lot of pain, last injections have not helped, having problems walking with significant pain. Please advise what Dr. Dossie Arbour suggest. Patient is aware Dr. Dossie Arbour not in until Tuesday.

## 2021-05-08 ENCOUNTER — Telehealth: Payer: Self-pay | Admitting: *Deleted

## 2021-05-08 ENCOUNTER — Other Ambulatory Visit: Payer: Self-pay | Admitting: Pain Medicine

## 2021-05-08 DIAGNOSIS — T85848S Pain due to other internal prosthetic devices, implants and grafts, sequela: Secondary | ICD-10-CM

## 2021-05-08 NOTE — Telephone Encounter (Signed)
Patient contacted, informed her that Dr. Dossie Arbour has entered an order for surgeon referral. Also advised her to keep appt for tomorrow.

## 2021-05-08 NOTE — Progress Notes (Signed)
The patient called the clinic indicating that the cluneal nerve blocks did not help her left-sided low back pain.  As previously stated, if this block was not to provide the patient with good relief of the pain, the neck step would be to get a referral to neurosurgery to revise the spinal cord stimulator battery and to have it implanted elsewhere since the current location continues to be very painful.  Today I reviewed the patient's MRI to see if there was any possible explanation for her symptoms on the left side, but the MRI seems to reveal some disc protrusions that are actually on the opposite side (right).

## 2021-05-08 NOTE — Telephone Encounter (Signed)
Needs appt

## 2021-05-08 NOTE — Progress Notes (Signed)
PROVIDER NOTE: Information contained herein reflects review and annotations entered in association with encounter. Interpretation of such information and data should be left to medically-trained personnel. Information provided to patient can be located elsewhere in the medical record under "Patient Instructions". Document created using STT-dictation technology, any transcriptional errors that may result from process are unintentional.    Patient: Karen Edwards  Service Category: E/M  Provider: Gaspar Cola, MD  DOB: 1966/04/22  DOS: 05/09/2021  Specialty: Interventional Pain Management  MRN: [id number]  Setting: Ambulatory outpatient  PCP: Pleas Koch, NP  Type: Established Patient    Referring Provider: Pleas Koch, NP  Location: Office  Delivery: Face-to-face     HPI  Karen Edwards, a 55 y.o. year old female, is here today because of her Pain due to other internal prosthetic devices, implants and grafts, sequela [T85.848S]. Ms. Reckner primary complain today is Back Pain (lower) Last encounter: My last encounter with her was on 05/07/2021. Pertinent problems: Ms. Beshears has Chronic low back pain (1ry area of Pain) (Bilateral) (R>L) w/ sciatica (Bilateral); DDD (degenerative disc disease), lumbar; DDD (degenerative disc disease), cervical; Chronic occipital neuralgia (5th area of Pain) (Bilateral) (R>L); Lumbar facet syndrome (Bilateral) (L>R); DDD (degenerative disc disease), lumbosacral; Lumbar radicular pain (Right) (L4); Sacroiliac joint dysfunction (Bilateral); Chronic neck pain (4th area of Pain) (Bilateral) (R>L); Numbness and tingling of upper extremity (Right); Chronic upper extremity pain (Bilateral) (R>L); Chronic sacroiliac joint pain (Bilateral) (L>R); Lumbar facet hypertrophy (Bilateral); L1-2 disc extrusion (Right); Cervical foraminal stenosis (C5-6) (Bilateral); Chronic pain syndrome; Chronic cervical radicular pain (Right: C6/C7) (Left: C5/T1); Chronic  lumbar radicular pain; Chronic hip pain (3ry area of Pain) (Bilateral) (R>L); Chronic lower extremity pain (2ry area of Pain) (Bilateral) (R>L); Chronic shoulder pain (Bilateral) (L>R); Chronic musculoskeletal pain; Lumbar spondylosis; Cervical facet syndrome (Bilateral) (R>L); Myofascial pain; Spondylosis without myelopathy or radiculopathy, cervical region; Cervicalgia; S/P insertion of Epidural Neurostimulator (SCS) (Bilateral); Chronic neck pain with history of cervical spinal surgery; 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[facility]); Trigger point with back pain (Right); Cervicogenic headache (Bilateral); Occipital headache (Bilateral); Chronic tension-type headache, intractable; Cervico-occipital neuralgia (Bilateral); Pain due to any device, implant or graft (SCS battery) (Left PSIS area); Spondylosis without myelopathy or radiculopathy, lumbosacral region; History of cervical spinal surgery (C5-7 ACDF); Cervical radiculopathy at C6 (Bilateral); Chronic low back pain (Bilateral) w/o sciatica; Presence of neurostimulator (Thoracolumbar); Spinal cord stimulator dysfunction ([place]); Acute left-sided low back pain without sciatica; Neurogenic pain; Chronic low back pain (Left) w/o sciatica (implant pain); Cluneal neuropathy (Left); and Abnormal MRI, lumbar spine (04/17/2021) on their pertinent problem list. Pain Assessment: Severity of Chronic pain is reported as a 8 /10. Location: Back Right, Left/outside of left upper leg, inside of left upper leg. Onset: More than a month ago. Quality: Stabbing, Aching, Burning. Timing: Constant. Modifying factor(s): changing positions. Vitals:  height is $RemoveB'5\' 1"'XavrRELN$  (1.549 m) and weight is 145 lb (65.8 kg). Her temporal temperature is 97.2 F (36.2 C) (abnormal). Her blood pressure is 140/67 and her pulse is 63. Her respiration is 16 and oxygen saturation is 100%.   Reason for encounter: both, medication management and post-procedure assessment.  Yesterday 05/08/2021 the  patient called the clinic indicating that they cluneal nerve block and not providing her with any relief of the pain.  The original plan was to do the diagnostic left cluneal nerve block and if this was effective, the plan was to follow it up with radiofrequency ablation to see if this could  provide her with good long-term relief of the pain.  Unfortunately, it would seem that it did not.  Therefore our plan was to get a referral to neurosurgery for revision of the spinal cord stimulator battery to see if it could be implanted elsewhere since its current location seems to be very uncomfortable for the patient.  A routine ambulatory referral to neurosurgery was entered on 05/08/2021.  During today's appointment, it turns out that she is exhibiting radicular symptoms that seem to be affecting the L1/L2 dermatomal distribution bilaterally, but with the left side being worse than the right.  She does have a recent lumbar MRI done on 04/14/2021 which showed an L1-2 shallow central and right paracentral disc protrusion with cephalad extension, which could be responsible for some of these symptoms.  However, at the L2-3 level she also has a bulging disc with facet arthropathy causing mild bilateral lateral recess stenosis.  RTCB: 08/14/2021 Nonopioids transfer 08/23/2020: Lidocaine 5% ointment and Robaxin  Post-Procedure Evaluation  Procedure (04/24/2021):  Type: Superior Cluneal Nerve Block(s)          Primary Purpose: Diagnostic Region: Posterolateral Lumbosacral Spine Level: L2 & L3  Dorsal Rami. Injecting these levels blocks origin of the Superior Cluneal Nerve. Laterality: Left Pre-procedure pain level: 8/10 Post-procedure: 0/10 (100% relief)  Anxiolysis: Minimal conscious anxiolysis  Effectiveness during initial hour after procedure (Ultra-Short Term Relief): 100 %.  Local anesthetic used: Long-acting (4-6 hours) Effectiveness: Defined as any analgesic benefit obtained secondary to the administration  of local anesthetics. This carries significant diagnostic value as to the etiological location, or anatomical origin, of the pain. Duration of benefit is expected to coincide with the duration of the local anesthetic used.  Effectiveness during initial 4-6 hours after procedure (Short-Term Relief): 100 %.  Long-term benefit: Defined as any relief past the pharmacologic duration of the local anesthetics.  Effectiveness past the initial 6 hours after procedure (Long-Term Relief): 0 %.  Benefits, current: Defined as benefit present at the time of this evaluation.   Analgesia:  Back to baseline Function: Back to baseline ROM: Back to baseline  Pharmacotherapy Assessment  Analgesic: Oxycodone IR 5 mg, 1 tab PO QD (5 mg/day of oxycodone) PRN MME/day: 7.5 mg/day.   Monitoring: [place] PMP: PDMP reviewed during this encounter.       Pharmacotherapy: No side-effects or adverse reactions reported. Compliance: No problems identified. Effectiveness: Clinically acceptable.  Landis Martins, RN  05/09/2021 10:08 AM  Sign when Signing Visit Safety precautions to be maintained throughout the outpatient stay will include: orient to surroundings, keep bed in low position, maintain call bell within reach at all times, provide assistance with transfer out of bed and ambulation.     UDS:  Summary  Date Value Ref Range Status  04/16/2021 Note  Final    Comment:    ==================================================================== [employer] Select 13 (MW) ==================================================================== Test                             Result       Flag       Units  Drug Absent but Declared for Prescription Verification   Oxycodone                      Not Detected UNEXPECTED ng/mg creat ==================================================================== Test                      Result  Flag   Units      Ref Range   Creatinine              41               mg/dL       >=20 ==================================================================== Declared Medications:  The flagging and interpretation on this report are based on the  following declared medications.  Unexpected results may arise from  inaccuracies in the declared medications.   **Note: The testing scope of this panel includes these medications:   Oxycodone (Roxicodone)   **Note: The testing scope of this panel does not include the  following reported medications:   Acetaminophen (Tylenol)  Biotin  Buspirone (Buspar)  Fish Oil  Fluoxetine (Prozac)  Gabapentin (Neurontin)  Ondansetron (Zofran)  Scopolamine  Tizanidine  Trazodone (Desyrel)  Turmeric ==================================================================== For clinical consultation, please call [phone]. ====================================================================      ROS  Constitutional: Denies any fever or chills Gastrointestinal: No reported hemesis, hematochezia, vomiting, or acute GI distress Musculoskeletal: Denies any acute onset joint swelling, redness, loss of ROM, or weakness Neurological: No reported episodes of acute onset apraxia, aphasia, dysarthria, agnosia, amnesia, paralysis, loss of coordination, or loss of consciousness  Medication Review  Biotin, FLUoxetine, Fish Oil, Turmeric, acetaminophen, busPIRone, gabapentin, ondansetron, oxyCODONE, scopolamine, tiZANidine, and traZODone  History Review  Allergy: Ms. Chismar has No Known Allergies. Drug: Ms. Keady  reports no history of drug use. Alcohol:  reports current alcohol use of about 1.0 standard drink of alcohol per week. Tobacco:  reports that she has been smoking cigarettes. She has a 18.50 pack-year smoking history. She has never used smokeless tobacco. Social: Ms. Brisbon  reports that she has been smoking cigarettes. She has a 18.50 pack-year smoking history. She has never used smokeless tobacco. She reports current alcohol  use of about 1.0 standard drink of alcohol per week. She reports that she does not use drugs. Medical:  has a past medical history of Anxiety, Chronic back pain, Degenerative joint disease, Depression, Headache, Hypotension, IBS (irritable bowel syndrome) (05/2015), and MVP (mitral valve prolapse). Surgical: Ms. Wyss  has a past surgical history that includes Cesarean section; Ankle surgery (Right, 03/02/2014); Hemorrhoid surgery (N/A, 06/16/2015); Cervical spine surgery (02/21/2016); Cesarean section with bilateral tubal ligation (1990); Colonoscopy (04/19/2015); Hysteroscopy with D & C (12/24/2013); Colonoscopy with propofol (N/A, 05/18/2018); Esophagogastroduodenoscopy (egd) with propofol (N/A, 05/18/2018); Spinal cord stimulator insertion (N/A, 06/18/2018); Tubal ligation; and Lumbar spinal cord simulator revision (N/A, 03/14/2020). Family: family history includes Arthritis in her mother; Congestive Heart Failure in her maternal grandfather; Depression in her mother; Diabetes in her maternal aunt; Diabetes Mellitus I in her maternal grandmother; Gout in her maternal aunt; Hearing loss in her father; Heart attack in her maternal grandfather; Hyperlipidemia in her father and mother; Hypertension in her father and mother; Hypothyroidism in her mother; Kidney disease in her mother; Prostate cancer in her paternal grandfather; Stroke in her maternal grandmother; Thyroid disease in her maternal aunt; Vision loss in her mother.  Laboratory Chemistry Profile   Renal Lab Results  Component Value Date   BUN 12 02/01/2021   CREATININE 0.85 02/01/2021   BCR 8 (L) 09/08/2017   GFR 77.34 02/01/2021   GFRAA 93 09/08/2017   GFRNONAA 81 09/08/2017    Hepatic Lab Results  Component Value Date   AST 15 02/01/2021   ALT 11 02/01/2021   ALBUMIN 4.4 02/01/2021   ALKPHOS 59 02/01/2021    Electrolytes Lab Results  Component Value  Date   NA 139 02/01/2021   K 4.2 02/01/2021   CL 105 02/01/2021   CALCIUM 9.3  02/01/2021   MG 2.1 09/08/2017    Bone Lab Results  Component Value Date   VD25OH 43.74 01/10/2016   25OHVITD1 51 09/08/2017   25OHVITD2 <1.0 09/08/2017   25OHVITD3 51 09/08/2017    Inflammation (CRP: Acute Phase) (ESR: Chronic Phase) Lab Results  Component Value Date   CRP 1.4 09/08/2017   ESRSEDRATE 11 09/08/2017       Note: Above Lab results reviewed.  Recent Imaging Review  DG PAIN CLINIC C-ARM 1-60 MIN NO REPORT Fluoro was used, but no Radiologist interpretation will be provided.  Please refer to "NOTES" tab for provider progress note. Note: Reviewed        Physical Exam  General appearance: Well nourished, well developed, and well hydrated. In no apparent acute distress Mental status: Alert, oriented x 3 (person, place, & time)       Respiratory: No evidence of acute respiratory distress Eyes: PERLA Vitals: BP 140/67 (BP Location: Left Arm, Patient Position: Sitting, Cuff Size: Normal)   Pulse 63   Temp (!) 97.2 F (36.2 C) (Temporal)   Resp 16   Ht _0  (1.549 m)   Wt 145 lb (65.8 kg)   LMP 05/05/2017 (Exact Date)   SpO2 100%   BMI 27.40 kg/m  BMI: Estimated body mass index is 27.4 kg/m as calculated from the following:   Height as of this encounter: _1  (1.549 m).   Weight as of this encounter: 145 lb (65.8 kg). Ideal: Ideal body weight: 47.8 kg (105 lb 6.1 oz) Adjusted ideal body weight: 55 kg (121 lb 3.7 oz)  Assessment   Status Diagnosis  Controlled Controlled Controlled 1. Pain due to any device, implant or graft (SCS battery) (Left PSIS area)   2. Chronic low back pain (Left) w/o sciatica (implant pain)   3. Presence of neurostimulator (Thoracolumbar)   4. Chronic pain syndrome   5. Chronic low back pain (1ry area of Pain) (Bilateral) (R>L) w/ sciatica (Bilateral)   6. Chronic lower extremity pain (2ry area of Pain) (Bilateral) (R>L)   7. Chronic hip pain (3ry area of Pain) (Bilateral) (R>L)   8. Chronic neck pain (4th area of Pain)  (Bilateral) (R>L)   9. Chronic occipital neuralgia (5th area of Pain) (Bilateral) (R>L)   10. Chronic low back pain (Bilateral) w/o sciatica   11. Pharmacologic therapy   12. Chronic use of opiate for therapeutic purpose   13. Abnormal MRI, lumbar spine (04/17/2021)   14. Neurogenic pain      Updated Problems: Problem  Abnormal MRI, lumbar spine (04/17/2021)   (04/17/2021) LUMBAR MRI FINDINGS: Paraspinal and other soft tissues: New spinal cord stimulator noted.  DISC LEVELS: L1-2: Shallow central and right paracentral disc protrusion with cephalad extension. L2-3: Mild disc bulging and facet arthropathy. Mild bilateral lateral recess stenosis. L3-4: Mild disc bulging with superimposed small right foraminal disc protrusion. Bilateral facet arthropathy. Borderline mild right neuroforaminal stenosis. L4-5: Mild disc bulging and bilateral facet arthropathy. Mild bilateral neuroforaminal stenosis. L5-S1: Negative.  IMPRESSION: 1. Unchanged mild multilevel lumbar spondylosis as described above.       Plan of Care  Problem-specific:  No problem-specific Assessment & Plan notes found for this encounter.  Ms. KELIYAH SPILLMAN has a current medication list which includes the following long-term medication(s): fluoxetine, [START ON 05/16/2021] oxycodone, [START ON 06/15/2021] oxycodone, [START ON 07/15/2021] oxycodone, trazodone, and gabapentin.  Pharmacotherapy (Medications Ordered): Meds ordered this encounter  Medications   oxyCODONE (OXY IR/ROXICODONE) 5 MG immediate release tablet    Sig: Take 1 tablet (5 mg total) by mouth 2 (two) times daily as needed for severe pain. Must last 30 days.    Dispense:  40 tablet    Refill:  0    Not a duplicate. Do NOT delete! Dispense 1 day early if closed on refill date. Avoid benzodiazepines within 8 hours of opioids. Do not send refill requests.   gabapentin (NEURONTIN) 100 MG capsule    Sig: Take 1 capsule (100 mg total) by mouth 3 (three) times  daily for 14 days, THEN 2 capsules (200 mg total) 2 (two) times daily for 14 days, THEN 2 capsules (200 mg total) 3 (three) times daily for 16 days. Follow written titration schedule.    Dispense:  194 capsule    Refill:  0    Fill one day early if pharmacy is closed on scheduled refill date. May substitute for generic if available.    Orders:  Orders Placed This Encounter  Procedures   Lumbar Epidural Injection    Standing Status:   Future    Standing Expiration Date:   06/09/2021    Scheduling Instructions:     Procedure: Interlaminar Lumbar Epidural Steroid injection (LESI)  L1-2     Laterality: Left-sided     Sedation: Patient's choice.     Timeframe: ASAA    Order Specific Question:   Where will this procedure be performed?    Answer:   [facility]    Follow-up plan:   Return for Procedure (w/ sedation): (L) L1-2 LESI #1.     Interventional Therapies  Risk  Complexity Considerations:   WNL   Planned  Pending:   Therapeutic right lumbar facet RFA #2 (02/01/2021)    Under consideration:   Diagnostic left L2 and L3 MBB (superior cluneal nerve) #1  Possible left superior cluneal nerve RFA    Completed:   Therapeutic right cervical ESI x1 (08/24/2020) Diagnostic bilateral GONB x1 (08/19/2019) Palliative right cervical facet MBB x3 (07/23/2018)  Palliative left cervical facet MBB x4 (04/01/2019)  Palliative right cervical facet RFA x1 (09/17/2018) Palliative left cervical facet RFA x1 (05/04/2019) Palliative right L2-3 interlaminar LESI x2  Palliative right L1 TFESI x1  Bilateral lumbar spinal cord stimulator trial (done) (05/14/2018) (by me)  Bilateral lumbar spinal cord stimulator implant (09/28/2019) (by me)  Bilateral spinal cord stimulator revision (03/14/2020) (by me)  Diagnostic bilateral lumbar facet MBB x3  Diagnostic bilateral SI joint block x2  Palliative left lumbar facet RFA x2 (03/27/2021)  Therapeutic right lumbar facet RFA x2 (02/01/2021)    Therapeutic   Palliative (PRN) options:   Therapeutic right cervical ESI #2  Diagnostic bilateral GONB #2  Palliative bilateral cervical facet blocks #5  Palliative right cervical facet RFA #2  Palliative left cervical facet RFA #2  Palliative right L2-3 interlaminar LESI #3  Palliative right L1 TFESI #2  Diagnostic bilateral lumbar facet block #4  Diagnostic bilateral SI joint block #3  Palliative left lumbar facet RFA #2  Palliative right lumbar facet RFA #3      Recent Visits Date Type Provider Dept  04/24/21 Procedure visit Milinda Pointer, MD [facility]  04/16/21 Office Visit Milinda Pointer, MD [facility]  04/03/21 Office Visit Milinda Pointer, MD [facility]  03/27/21 Procedure visit Milinda Pointer, MD [facility]  02/26/21 Office Visit Milinda Pointer, MD [facility]  Showing recent visits within past 90 days and meeting all other requirements Today's Visits Date Type Provider Dept  05/09/21 Office Visit Milinda Pointer, MD [facility]  Showing today's visits and meeting all other requirements Future Appointments Date Type Provider Dept  05/10/21 Appointment Milinda Pointer, MD [facility]  05/14/21 Appointment Milinda Pointer, MD [facility]  07/11/21 Appointment Milinda Pointer, MD [facility]  Showing future appointments within next 90 days and meeting all other requirements I discussed the assessment and treatment plan with the patient. The patient was provided an opportunity to ask questions and all were answered. The patient agreed with the plan and demonstrated an understanding of the instructions.  Patient advised to call back or seek an in-person evaluation if the symptoms or condition worsens.  Duration of encounter: 30 minutes.  Note by: Gaspar Cola, MD Date: 05/09/2021; Time: 4:06 PM

## 2021-05-09 ENCOUNTER — Encounter: Payer: Self-pay | Admitting: Pain Medicine

## 2021-05-09 ENCOUNTER — Ambulatory Visit: Payer: Medicare PPO | Admitting: Pain Medicine

## 2021-05-09 ENCOUNTER — Other Ambulatory Visit: Payer: Self-pay

## 2021-05-09 ENCOUNTER — Ambulatory Visit: Payer: Medicare PPO | Attending: Pain Medicine | Admitting: Pain Medicine

## 2021-05-09 VITALS — BP 140/67 | HR 63 | Temp 97.2°F | Resp 16 | Ht 61.0 in | Wt 145.0 lb

## 2021-05-09 DIAGNOSIS — Z79899 Other long term (current) drug therapy: Secondary | ICD-10-CM

## 2021-05-09 DIAGNOSIS — M5442 Lumbago with sciatica, left side: Secondary | ICD-10-CM | POA: Diagnosis not present

## 2021-05-09 DIAGNOSIS — M25552 Pain in left hip: Secondary | ICD-10-CM

## 2021-05-09 DIAGNOSIS — G894 Chronic pain syndrome: Secondary | ICD-10-CM

## 2021-05-09 DIAGNOSIS — M79605 Pain in left leg: Secondary | ICD-10-CM | POA: Diagnosis not present

## 2021-05-09 DIAGNOSIS — R937 Abnormal findings on diagnostic imaging of other parts of musculoskeletal system: Secondary | ICD-10-CM | POA: Insufficient documentation

## 2021-05-09 DIAGNOSIS — M792 Neuralgia and neuritis, unspecified: Secondary | ICD-10-CM

## 2021-05-09 DIAGNOSIS — M5441 Lumbago with sciatica, right side: Secondary | ICD-10-CM

## 2021-05-09 DIAGNOSIS — T85848S Pain due to other internal prosthetic devices, implants and grafts, sequela: Secondary | ICD-10-CM

## 2021-05-09 DIAGNOSIS — M25551 Pain in right hip: Secondary | ICD-10-CM

## 2021-05-09 DIAGNOSIS — Z9682 Presence of neurostimulator: Secondary | ICD-10-CM | POA: Diagnosis not present

## 2021-05-09 DIAGNOSIS — Z79891 Long term (current) use of opiate analgesic: Secondary | ICD-10-CM

## 2021-05-09 DIAGNOSIS — G8929 Other chronic pain: Secondary | ICD-10-CM

## 2021-05-09 DIAGNOSIS — M79604 Pain in right leg: Secondary | ICD-10-CM

## 2021-05-09 DIAGNOSIS — M545 Low back pain, unspecified: Secondary | ICD-10-CM

## 2021-05-09 DIAGNOSIS — M542 Cervicalgia: Secondary | ICD-10-CM

## 2021-05-09 DIAGNOSIS — M5481 Occipital neuralgia: Secondary | ICD-10-CM | POA: Diagnosis not present

## 2021-05-09 MED ORDER — OXYCODONE HCL 5 MG PO TABS: 5.0000 mg | ORAL_TABLET | Freq: Two times a day (BID) | ORAL | 0 refills | Status: DC | PRN

## 2021-05-09 MED ORDER — GABAPENTIN 100 MG PO CAPS
ORAL_CAPSULE | ORAL | 0 refills | Status: DC
Start: 2021-05-09 — End: 2021-08-13

## 2021-05-09 NOTE — Progress Notes (Signed)
Safety precautions to be maintained throughout the outpatient stay will include: orient to surroundings, keep bed in low position, maintain call bell within reach at all times, provide assistance with transfer out of bed and ambulation.  

## 2021-05-09 NOTE — Progress Notes (Signed)
PROVIDER NOTE: Information contained herein reflects review and annotations entered in association with encounter. Interpretation of such information and data should be left to medically-trained personnel. Information provided to patient can be located elsewhere in the medical record under "Patient Instructions". Document created using STT-dictation technology, any transcriptional errors that may result from process are unintentional.    Patient: Karen Edwards  Service Category: Procedure  Provider: Gaspar Cola, MD  DOB: [birth year]  DOS: 05/10/2021  Location: [facility]  MRN: [id number]  Setting: Ambulatory - outpatient  Referring Provider: Pleas Koch, NP  Type: Established Patient  Specialty: Interventional Pain Management  PCP: Pleas Koch, NP   Primary Reason for Visit: Interventional Pain Management Treatment. CC: Back Pain  Procedure:          Anesthesia, Analgesia, Anxiolysis:  Type: Therapeutic Inter-Laminar Epidural Steroid Injection           Region: Lumbar Level: L1-2 Level. Laterality: Left         Type: Minimal (Conscious) Anxiolysis combined with Local Anesthesia Indication(s): Analgesia and Anxiety Route: Intravenous (IV) IV Access: Secured Sedation: Meaningful verbal contact was maintained at all times during the procedure  Local Anesthetic: Lidocaine 1-2%  Position: Prone with head of the table was raised to facilitate breathing.   Indications: 1. DDD (degenerative disc disease), lumbar   2. Chronic lower extremity pain (2ry area of Pain) (Bilateral) (R>L)   3. 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 L1-2 disc extrusion (Right)   5. Abnormal MRI, lumbar spine (04/17/2021)   6. Encounter for therapeutic procedure   7. Anxiety due to invasive procedure    Pain Score: Pre-procedure: 8 /10 Post-procedure: 0-No pain/10   Pre-op H&P Assessment:  Karen Edwards is a 55 y.o. (year old),  female patient, seen today for interventional treatment. She  has a past surgical history that includes Cesarean section; Ankle surgery (Right, 03/02/2014); Hemorrhoid surgery (N/A, 06/16/2015); Cervical spine surgery (02/21/2016); Cesarean section with bilateral tubal ligation (1990); Colonoscopy (04/19/2015); Hysteroscopy with D & C (12/24/2013); Colonoscopy with propofol (N/A, 05/18/2018); Esophagogastroduodenoscopy (egd) with propofol (N/A, 05/18/2018); Spinal cord stimulator insertion (N/A, 06/18/2018); Tubal ligation; and Lumbar spinal cord simulator revision (N/A, 03/14/2020). Karen Edwards has a current medication list which includes the following prescription(s): acetaminophen, biotin, buspirone, fluoxetine, gabapentin, fish oil, ondansetron, [START ON 05/16/2021] oxycodone, [START ON 06/15/2021] oxycodone, [START ON 07/15/2021] oxycodone, scopolamine, tizanidine, trazodone, and turmeric. Her primarily concern today is the Back Pain  Initial Vital Signs:  Pulse/HCG Rate: 65ECG Heart Rate: (!) 52 Temp: (!) 97.3 F (36.3 C) Resp: 18 BP: 109/76 SpO2: 99 %  BMI: Estimated body mass index is 27.02 kg/m as calculated from the following:   Height as of this encounter: 5\' 1"  (1.549 m).   Weight as of this encounter: 143 lb (64.9 kg).  Risk Assessment: Allergies: Reviewed. She has No Known Allergies.  Allergy Precautions: None required Coagulopathies: Reviewed. None identified.  Blood-thinner therapy: None at this time Active Infection(s): Reviewed. None identified. Karen Edwards is afebrile  Site Confirmation: Karen Edwards was asked to confirm the procedure and laterality before marking the site Procedure checklist: Completed Consent: Before the procedure and under the influence of no sedative(s), amnesic(s), or anxiolytics, the patient was informed of the treatment options, risks and possible complications. To fulfill our ethical and legal obligations, as recommended by the American Medical  Association's Code of Ethics, I have informed the patient of my  clinical impression; the nature and purpose of the treatment or procedure; the risks, benefits, and possible complications of the intervention; the alternatives, including doing nothing; the risk(s) and benefit(s) of the alternative treatment(s) or procedure(s); and the risk(s) and benefit(s) of doing nothing. The patient was provided information about the general risks and possible complications associated with the procedure. These may include, but are not limited to: failure to achieve desired goals, infection, bleeding, organ or nerve damage, allergic reactions, paralysis, and death. In addition, the patient was informed of those risks and complications associated to Spine-related procedures, such as failure to decrease pain; infection (i.e.: Meningitis, epidural or intraspinal abscess); bleeding (i.e.: epidural hematoma, subarachnoid hemorrhage, or any other type of intraspinal or peri-dural bleeding); organ or nerve damage (i.e.: Any type of peripheral nerve, nerve root, or spinal cord injury) with subsequent damage to sensory, motor, and/or autonomic systems, resulting in permanent pain, numbness, and/or weakness of one or several areas of the body; allergic reactions; (i.e.: anaphylactic reaction); and/or death. Furthermore, the patient was informed of those risks and complications associated with the medications. These include, but are not limited to: allergic reactions (i.e.: anaphylactic or anaphylactoid reaction(s)); adrenal axis suppression; blood sugar elevation that in diabetics may result in ketoacidosis or comma; water retention that in patients with history of congestive heart failure may result in shortness of breath, pulmonary edema, and decompensation with resultant heart failure; weight gain; swelling or edema; medication-induced neural toxicity; particulate matter embolism and blood vessel occlusion with resultant organ, and/or  nervous system infarction; and/or aseptic necrosis of one or more joints. Finally, the patient was informed that Medicine is not an exact science; therefore, there is also the possibility of unforeseen or unpredictable risks and/or possible complications that may result in a catastrophic outcome. The patient indicated having understood very clearly. We have given the patient no guarantees and we have made no promises. Enough time was given to the patient to ask questions, all of which were answered to the patient's satisfaction. Ms. Diehl has indicated that she wanted to continue with the procedure. Attestation: I, the ordering provider, attest that I have discussed with the patient the benefits, risks, side-effects, alternatives, likelihood of achieving goals, and potential problems during recovery for the procedure that I have provided informed consent. Date  Time: 05/10/2021  1:29 PM  Pre-Procedure Preparation:  Monitoring: As per clinic protocol. Respiration, ETCO2, SpO2, BP, heart rate and rhythm monitor placed and checked for adequate function Safety Precautions: Patient was assessed for positional comfort and pressure points before starting the procedure. Time-out: I initiated and conducted the "Time-out" before starting the procedure, as per protocol. The patient was asked to participate by confirming the accuracy of the "Time Out" information. Verification of the correct person, site, and procedure were performed and confirmed by me, the nursing staff, and the patient. "Time-out" conducted as per Joint Commission's Universal Protocol (UP.01.01.01). Time: 1443  Description of Procedure:          Target Area: The interlaminar space, initially targeting the lower laminar border of the superior vertebral body. Approach: Paramedial approach. Area Prepped: Entire Posterior Lumbar Region DuraPrep (Iodine Povacrylex [0.7% available iodine] and Isopropyl Alcohol, 74% w/w) Safety Precautions:  Aspiration looking for blood return was conducted prior to all injections. At no point did we inject any substances, as a needle was being advanced. No attempts were made at seeking any paresthesias. Safe injection practices and needle disposal techniques used. Medications properly checked for expiration dates. SDV (single dose  vial) medications used. Description of the Procedure: Protocol guidelines were followed. The procedure needle was introduced through the skin, ipsilateral to the reported pain, and advanced to the target area. Bone was contacted and the needle walked caudad, until the lamina was cleared. The epidural space was identified using "loss-of-resistance technique" with 2-3 ml of PF-NaCl (0.9% NSS), in a 5cc LOR glass syringe.  Vitals:   05/10/21 1440 05/10/21 1445 05/10/21 1450 05/10/21 1459  BP: 127/65 128/70 119/70 139/68  Pulse:      Resp: 16 18 16 16   Temp:    (!) 97.4 F (36.3 C)  SpO2: 97% 100% 97% 98%  Weight:      Height:        Start Time: 1443 hrs. End Time: 1449 hrs.  Materials:  Needle(s) Type: Epidural needle Gauge: 17G Length: 3.5-in Medication(s): Please see orders for medications and dosing details.  Imaging Guidance (Spinal):          Type of Imaging Technique: Fluoroscopy Guidance (Spinal) Indication(s): Assistance in needle guidance and placement for procedures requiring needle placement in or near specific anatomical locations not easily accessible without such assistance. Exposure Time: Please see nurses notes. Contrast: Before injecting any contrast, we confirmed that the patient did not have an allergy to iodine, shellfish, or radiological contrast. Once satisfactory needle placement was completed at the desired level, radiological contrast was injected. Contrast injected under live fluoroscopy. No contrast complications. See chart for type and volume of contrast used. Fluoroscopic Guidance: I was personally present during the use of fluoroscopy.  "Tunnel Vision Technique" used to obtain the best possible view of the target area. Parallax error corrected before commencing the procedure. "Direction-depth-direction" technique used to introduce the needle under continuous pulsed fluoroscopy. Once target was reached, antero-posterior, oblique, and lateral fluoroscopic projection used confirm needle placement in all planes. Images permanently stored in EMR. Interpretation: I personally interpreted the imaging intraoperatively. Adequate needle placement confirmed in multiple planes. Appropriate spread of contrast into desired area was observed. No evidence of afferent or efferent intravascular uptake. No intrathecal or subarachnoid spread observed. Permanent images saved into the patient's record.  Antibiotic Prophylaxis:   Anti-infectives (From admission, onward)    None      Indication(s): None identified  Post-operative Assessment:  Post-procedure Vital Signs:  Pulse/HCG Rate: 6561 Temp: (!) 97.4 F (36.3 C) Resp: 16 BP: 139/68 SpO2: 98 %  EBL: None  Complications: No immediate post-treatment complications observed by team, or reported by patient.  Note: The patient tolerated the entire procedure well. A repeat set of vitals were taken after the procedure and the patient was kept under observation following institutional policy, for this type of procedure. Post-procedural neurological assessment was performed, showing return to baseline, prior to discharge. The patient was provided with post-procedure discharge instructions, including a section on how to identify potential problems. Should any problems arise concerning this procedure, the patient was given instructions to immediately contact us, at any time, without hesitation. In any case, we plan to contact the patient by telephone for a follow-up status report regarding this interventional procedure.  Comments:  No additional relevant information.  Plan of Care  Orders:  Orders  Placed This Encounter  Procedures   Lumbar Epidural Injection    Scheduling Instructions:     Procedure: Interlaminar LESI L1-2     Laterality: Left-sided     Sedation: Patient's choice     Timeframe:  Today    Order Specific Question:   Where will this  procedure be performed?    Answer:   [facility]   DG PA[facility] C-ARM 1-60 MIN NO REPORT    Intraoperative interpretation by procedural physician at [facility].    Standing Status:   Standing    Number of Occurrences:   1    Order Specific Question:   Reason for exam:    Answer:   Assistance in needle guidance and placement for procedures requiring needle placement in or near specific anatomical locations not easily accessible without such assistance.   Informed Consent Details: Physician/Practitioner Attestation; Transcribe to consent form and obtain patient signature    Note: Always confirm laterality of pain with Ms. Tye Savoy, before procedure. Transcribe to consent form and obtain patient signature.    Order Specific Question:   Physician/Practitioner attestation of informed consent for procedure/surgical case    Answer:   I, the physician/practitioner, attest that I have discussed with the patient the benefits, risks, side effects, alternatives, likelihood of achieving goals and potential problems during recovery for the procedure that I have provided informed consent.    Order Specific Question:   Procedure    Answer:   Lumbar epidural steroid injection under fluoroscopic guidance    Order Specific Question:   Physician/Practitioner performing the procedure    Answer:   Daily Doe A. 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 Discontinue prior to discharge, but no sooner than 15 min after procedure.    Standing Status:   Standing    Number of Occurrences:   1   Chronic Opioid Analgesic:  Oxycodone IR 5 mg, 1 tab PO QD (5 mg/day of oxycodone) PRN MME/day: 7.5 mg/day.   Medications ordered for procedure: Meds ordered this encounter  Medications   iohexol (OMNIPAQUE) 180 MG/ML injection 10 mL    Must be Myelogram-compatible. If not available, you may substitute with a water-soluble, non-ionic, hypoallergenic, myelogram-compatible radiological contrast medium.   lidocaine (XYLOCAINE) 2 % (with pres) injection 400 mg   diazepam (VALIUM) tablet 5 mg    Make sure Flumazenil is available in the pyxis when using this medication. If oversedation occurs, administer 0.2 mg IV over 15 sec. If after 45 sec no response, administer 0.2 mg again over 1 min; may repeat at 1 min intervals; not to exceed 4 doses (1 mg)   sodium chloride flush (NS) 0.9 % injection 2 mL   ropivacaine (PF) 2 mg/mL (0.2%) (NAROPIN) injection 2 mL   triamcinolone acetonide (KENALOG-40) injection 40 mg   diphenhydrAMINE (BENADRYL) injection 12.5 mg   Medications administered: We administered iohexol, lidocaine, diazepam, sodium chloride flush, ropivacaine (PF) 2 mg/mL (0.2%), triamcinolone acetonide, and diphenhydrAMINE.  See the medical record for exact dosing, route, and time of administration.  Follow-up plan:   Return in about 2 weeks (around 05/24/2021) for procedure day (afternoon VV) (PPE).      Interventional Therapies  Risk  Complexity  Considerations:   WNL   Planned  Pending:   Therapeutic right lumbar facet RFA #2 (02/01/2021)    Under consideration:   Diagnostic left L2 and L3 MBB (superior cluneal nerve) #1   Possible left superior cluneal nerve RFA    Completed:   Therapeutic right cervical ESI x1 (08/24/2020) Diagnostic bilateral GONB x1 (08/19/2019) Palliative right cervical facet MBB x3 (07/23/2018)  Palliative left cervical facet MBB x4 (04/01/2019)  Palliative right cervical facet RFA x1 (09/17/2018) Palliative left cervical facet RFA x1 (05/04/2019) Palliative right L2-3 interlaminar LESI x2  Palliative right L1 TFESI x1  Bilateral lumbar spinal cord stimulator trial (done) (05/14/2018) (by me)  Bilateral lumbar spinal cord stimulator implant (09/28/2019) (by me)  Bilateral spinal cord stimulator revision (03/14/2020) (by me)  Diagnostic bilateral lumbar facet MBB x3  Diagnostic bilateral SI joint block x2  Palliative left lumbar facet RFA x2 (03/27/2021)  Therapeutic right lumbar facet RFA x2 (02/01/2021)    Therapeutic  Palliative (PRN) options:   Therapeutic right cervical ESI #2  Diagnostic bilateral GONB #2  Palliative bilateral cervical facet blocks #5  Palliative right cervical facet RFA #2  Palliative left cervical facet RFA #2  Palliative right L2-3 interlaminar LESI #3  Palliative right L1 TFESI #2  Diagnostic bilateral lumbar facet block #4  Diagnostic bilateral SI joint block #3  Palliative left lumbar facet RFA #2  Palliative right lumbar facet RFA #3       Recent Visits Date Type Provider Dept  05/09/21 Office Visit Milinda Pointer, MD [facility]  04/24/21 Procedure visit Milinda Pointer, MD [facility]  04/16/21 Office Visit Milinda Pointer, MD [facility]  04/03/21 Office Visit Milinda Pointer, MD [facility]  03/27/21 Procedure visit Milinda Pointer, MD [facility]  02/26/21 Office Visit Milinda Pointer, MD [facility]  Showing recent visits within past 90 days and meeting all other requirements Today's Visits Date Type Provider Dept  05/10/21 Procedure visit Milinda Pointer, MD  [facility]  Showing today's visits and meeting all other requirements Future Appointments Date Type Provider Dept  05/24/21 Appointment Milinda Pointer, MD [facility]  Showing future appointments within next 90 days and meeting all other requirements Disposition: Discharge home  Discharge (Date  Time): 05/10/2021; 1500 hrs.   Primary Care Physician: Pleas Koch, NP Location: [facility] Note by: Gaspar Cola, MD Date: 05/10/2021; Time: 7:29 PM  Disclaimer:  Medicine is not an Chief Strategy Officer. The only guarantee in medicine is that nothing is guaranteed. It is important to note that the decision to proceed with this intervention was based on the information collected from the patient. The Data and conclusions were drawn from the patient's questionnaire, the interview, and the physical examination. Because the information was provided in large part by the patient, it cannot be guaranteed that it has not been purposely or unconsciously manipulated. Every effort has been made to obtain as much relevant data as possible for this evaluation. It is important to note that the conclusions that lead to this procedure are derived in large part from the available data. Always take into account that the treatment will also be dependent on availability of resources and existing treatment guidelines, considered by other Pain Management Practitioners as being common knowledge and practice, at the time of the intervention. For Medico-Legal purposes, it is also important to point out that variation in procedural techniques and pharmacological choices are the acceptable norm. The indications,  contraindications, technique, and results of the above procedure should only be interpreted and judged by a Board-Certified Interventional Pain Specialist with extensive familiarity and expertise in the same exact procedure and technique.

## 2021-05-09 NOTE — Patient Instructions (Signed)
____________________________________________________________________________________________  Preparing for Procedure with Sedation  Procedure appointments are limited to planned procedures: No Prescription Refills. No disability issues will be discussed. No medication changes will be discussed.  Instructions: Oral Intake: Do not eat or drink anything for at least 8 hours prior to your procedure. (Exception: Blood Pressure Medication. See below.) Transportation: Unless otherwise stated by your physician, you may drive yourself after the procedure. Blood Pressure Medicine: Do not forget to take your blood pressure medicine with a sip of water the morning of the procedure. If your Diastolic (lower reading)is above 100 mmHg, elective cases will be cancelled/rescheduled. Blood thinners: These will need to be stopped for procedures. Notify our staff if you are taking any blood thinners. Depending on which one you take, there will be specific instructions on how and when to stop it. Diabetics on insulin: Notify the staff so that you can be scheduled 1st case in the morning. If your diabetes requires high dose insulin, take only  of your normal insulin dose the morning of the procedure and notify the staff that you have done so. Preventing infections: Shower with an antibacterial soap the morning of your procedure. Build-up your immune system: Take 1000 mg of Vitamin C with every meal (3 times a day) the day prior to your procedure. Antibiotics: Inform the staff if you have a condition or reason that requires you to take antibiotics before dental procedures. Pregnancy: If you are pregnant, call and cancel the procedure. Sickness: If you have a cold, fever, or any active infections, call and cancel the procedure. Arrival: You must be in the facility at least 30 minutes prior to your scheduled procedure. Children: Do not bring children with you. Dress appropriately: Bring dark clothing that you would  not mind if they get stained. Valuables: Do not bring any jewelry or valuables.  Reasons to call and reschedule or cancel your procedure: (Following these recommendations will minimize the risk of a serious complication.) Surgeries: Avoid having procedures within 2 weeks of any surgery. (Avoid for 2 weeks before or after any surgery). Flu Shots: Avoid having procedures within 2 weeks of a flu shots or . (Avoid for 2 weeks before or after immunizations). Barium: Avoid having a procedure within 7-10 days after having had a radiological study involving the use of radiological contrast. (Myelograms, Barium swallow or enema study). Heart attacks: Avoid any elective procedures or surgeries for the initial 6 months after a "Myocardial Infarction" (Heart Attack). Blood thinners: It is imperative that you stop these medications before procedures. Let us know if you if you take any blood thinner.  Infection: Avoid procedures during or within two weeks of an infection (including chest colds or gastrointestinal problems). Symptoms associated with infections include: Localized redness, fever, chills, night sweats or profuse sweating, burning sensation when voiding, cough, congestion, stuffiness, runny nose, sore throat, diarrhea, nausea, vomiting, cold or Flu symptoms, recent or current infections. It is specially important if the infection is over the area that we intend to treat. Heart and lung problems: Symptoms that may suggest an active cardiopulmonary problem include: cough, chest pain, breathing difficulties or shortness of breath, dizziness, ankle swelling, uncontrolled high or unusually low blood pressure, and/or palpitations. If you are experiencing any of these symptoms, cancel your procedure and contact your primary care physician for an evaluation.  Remember:  Regular Business hours are:  Monday to Thursday 8:00 AM to 4:00 PM  Provider's Schedule: Britnay Magnussen, MD:  Procedure days: Tuesday and  Thursday 7:30 AM   to 4:00 PM  Bilal Lateef, MD:  Procedure days: Monday and Wednesday 7:30 AM to 4:00 PM ____________________________________________________________________________________________  ____________________________________________________________________________________________  General Risks and Possible Complications  Patient Responsibilities: It is important that you read this as it is part of your informed consent. It is our duty to inform you of the risks and possible complications associated with treatments offered to you. It is your responsibility as a patient to read this and to ask questions about anything that is not clear or that you believe was not covered in this document.  Patient's Rights: You have the right to refuse treatment. You also have the right to change your mind, even after initially having agreed to have the treatment done. However, under this last option, if you wait until the last second to change your mind, you may be charged for the materials used up to that point.  Introduction: Medicine is not an exact science. Everything in Medicine, including the lack of treatment(s), carries the potential for danger, harm, or loss (which is by definition: Risk). In Medicine, a complication is a secondary problem, condition, or disease that can aggravate an already existing one. All treatments carry the risk of possible complications. The fact that a side effects or complications occurs, does not imply that the treatment was conducted incorrectly. It must be clearly understood that these can happen even when everything is done following the highest safety standards.  No treatment: You can choose not to proceed with the proposed treatment alternative. The "PRO(s)" would include: avoiding the risk of complications associated with the therapy. The "CON(s)" would include: not getting any of the treatment benefits. These benefits fall under one of three categories: diagnostic;  therapeutic; and/or palliative. Diagnostic benefits include: getting information which can ultimately lead to improvement of the disease or symptom(s). Therapeutic benefits are those associated with the successful treatment of the disease. Finally, palliative benefits are those related to the decrease of the primary symptoms, without necessarily curing the condition (example: decreasing the pain from a flare-up of a chronic condition, such as incurable terminal cancer).  General Risks and Complications: These are associated to most interventional treatments. They can occur alone, or in combination. They fall under one of the following six (6) categories: no benefit or worsening of symptoms; bleeding; infection; nerve damage; allergic reactions; and/or death. No benefits or worsening of symptoms: In Medicine there are no guarantees, only probabilities. No healthcare provider can ever guarantee that a medical treatment will work, they can only state the probability that it may. Furthermore, there is always the possibility that the condition may worsen, either directly, or indirectly, as a consequence of the treatment. Bleeding: This is more common if the patient is taking a blood thinner, either prescription or over the counter (example: Goody Powders, Fish oil, Aspirin, Garlic, etc.), or if suffering a condition associated with impaired coagulation (example: Hemophilia, cirrhosis of the liver, low platelet counts, etc.). However, even if you do not have one on these, it can still happen. If you have any of these conditions, or take one of these drugs, make sure to notify your treating physician. Infection: This is more common in patients with a compromised immune system, either due to disease (example: diabetes, cancer, human immunodeficiency virus [HIV], etc.), or due to medications or treatments (example: therapies used to treat cancer and rheumatological diseases). However, even if you do not have one on  these, it can still happen. If you have any of these conditions, or take one of   these drugs, make sure to notify your treating physician. Nerve Damage: This is more common when the treatment is an invasive one, but it can also happen with the use of medications, such as those used in the treatment of cancer. The damage can occur to small secondary nerves, or to large primary ones, such as those in the spinal cord and brain. This damage may be temporary or permanent and it may lead to impairments that can range from temporary numbness to permanent paralysis and/or brain death. Allergic Reactions: Any time a substance or material comes in contact with our body, there is the possibility of an allergic reaction. These can range from a mild skin rash (contact dermatitis) to a severe systemic reaction (anaphylactic reaction), which can result in death. Death: In general, any medical intervention can result in death, most of the time due to an unforeseen complication. ____________________________________________________________________________________________  

## 2021-05-10 ENCOUNTER — Ambulatory Visit (HOSPITAL_BASED_OUTPATIENT_CLINIC_OR_DEPARTMENT_OTHER): Payer: Medicare PPO | Admitting: Pain Medicine

## 2021-05-10 ENCOUNTER — Ambulatory Visit
Admission: RE | Admit: 2021-05-10 | Discharge: 2021-05-10 | Disposition: A | Discharge status: Home / Self Care | Payer: Medicare PPO | Source: Ambulatory Visit | Attending: Pain Medicine | Admitting: Pain Medicine

## 2021-05-10 ENCOUNTER — Encounter: Payer: Self-pay | Admitting: Pain Medicine

## 2021-05-10 VITALS — BP 139/68 | HR 65 | Temp 97.4°F | Resp 16 | Ht 61.0 in | Wt 143.0 lb

## 2021-05-10 DIAGNOSIS — M5440 Lumbago with sciatica, unspecified side: Secondary | ICD-10-CM

## 2021-05-10 DIAGNOSIS — F419 Anxiety disorder, unspecified: Secondary | ICD-10-CM | POA: Insufficient documentation

## 2021-05-10 DIAGNOSIS — M79604 Pain in right leg: Secondary | ICD-10-CM | POA: Diagnosis not present

## 2021-05-10 DIAGNOSIS — M5126 Other intervertebral disc displacement, lumbar region: Secondary | ICD-10-CM | POA: Diagnosis not present

## 2021-05-10 DIAGNOSIS — R937 Abnormal findings on diagnostic imaging of other parts of musculoskeletal system: Secondary | ICD-10-CM

## 2021-05-10 DIAGNOSIS — M5136 Other intervertebral disc degeneration, lumbar region: Secondary | ICD-10-CM

## 2021-05-10 DIAGNOSIS — M79605 Pain in left leg: Secondary | ICD-10-CM | POA: Diagnosis not present

## 2021-05-10 DIAGNOSIS — Z5189 Encounter for other specified aftercare: Secondary | ICD-10-CM

## 2021-05-10 DIAGNOSIS — M5442 Lumbago with sciatica, left side: Secondary | ICD-10-CM | POA: Diagnosis not present

## 2021-05-10 DIAGNOSIS — M5441 Lumbago with sciatica, right side: Secondary | ICD-10-CM | POA: Insufficient documentation

## 2021-05-10 DIAGNOSIS — M51369 Other intervertebral disc degeneration, lumbar region without mention of lumbar back pain or lower extremity pain: Secondary | ICD-10-CM

## 2021-05-10 DIAGNOSIS — G8929 Other chronic pain: Secondary | ICD-10-CM | POA: Insufficient documentation

## 2021-05-10 MED ORDER — TRIAMCINOLONE ACETONIDE 40 MG/ML IJ SUSP
INTRAMUSCULAR | Status: AC
  Filled 2021-05-10: qty 1

## 2021-05-10 MED ORDER — SODIUM CHLORIDE (PF) 0.9 % IJ SOLN
INTRAMUSCULAR | Status: AC
  Filled 2021-05-10: qty 10

## 2021-05-10 MED ORDER — ROPIVACAINE HCL 2 MG/ML IJ SOLN
INTRAMUSCULAR | Status: AC
  Filled 2021-05-10: qty 20

## 2021-05-10 MED ORDER — DIPHENHYDRAMINE HCL 50 MG/ML IJ SOLN
INTRAMUSCULAR | Status: AC
  Filled 2021-05-10: qty 1

## 2021-05-10 MED ORDER — LIDOCAINE HCL 2 % IJ SOLN
20.0000 mL | Freq: Once | INTRAMUSCULAR | Status: AC
Start: 2021-05-10 — End: 2021-05-10
  Administered 2021-05-10: 200 mg

## 2021-05-10 MED ORDER — DIAZEPAM 5 MG PO TABS
ORAL_TABLET | ORAL | Status: AC
  Filled 2021-05-10: qty 2

## 2021-05-10 MED ORDER — SODIUM CHLORIDE 0.9% FLUSH
2.0000 mL | Freq: Once | INTRAVENOUS | Status: AC
Start: 2021-05-10 — End: 2021-05-10
  Administered 2021-05-10: 2 mL

## 2021-05-10 MED ORDER — TRIAMCINOLONE ACETONIDE 40 MG/ML IJ SUSP
40.0000 mg | Freq: Once | INTRAMUSCULAR | Status: AC
  Administered 2021-05-10: 40 mg

## 2021-05-10 MED ORDER — DIAZEPAM 5 MG PO TABS
5.0000 mg | ORAL_TABLET | Freq: Once | ORAL | Status: AC
  Administered 2021-05-10: 5 mg via ORAL

## 2021-05-10 MED ORDER — LIDOCAINE HCL 2 % IJ SOLN
INTRAMUSCULAR | Status: AC
  Filled 2021-05-10: qty 10

## 2021-05-10 MED ORDER — ROPIVACAINE HCL 2 MG/ML IJ SOLN
2.0000 mL | Freq: Once | INTRAMUSCULAR | Status: AC
  Administered 2021-05-10: 2 mL via EPIDURAL

## 2021-05-10 MED ORDER — IOHEXOL 180 MG/ML  SOLN
10.0000 mL | Freq: Once | INTRAMUSCULAR | Status: AC
  Administered 2021-05-10: 10 mL via EPIDURAL

## 2021-05-10 MED ORDER — DIPHENHYDRAMINE HCL 50 MG/ML IJ SOLN
12.5000 mg | Freq: Once | INTRAMUSCULAR | Status: AC
  Administered 2021-05-10: 12.5 mg via INTRAVENOUS

## 2021-05-10 NOTE — Progress Notes (Signed)
Safety precautions to be maintained throughout the outpatient stay will include: orient to surroundings, keep bed in low position, maintain call bell within reach at all times, provide assistance with transfer out of bed and ambulation.  

## 2021-05-10 NOTE — Patient Instructions (Signed)

## 2021-05-10 NOTE — Telephone Encounter (Signed)
Called patient she is taking the Tizanidine 2-3 times a day and would like refill. She is aware you are out of the office until next week and has "plenty" to last until then.   Patient also states that at last visit 6/15 was started on Buspirone twice a day. Has not been able to see any improvement in symptoms. Wanted to know if this could be increased.

## 2021-05-11 ENCOUNTER — Telehealth: Payer: Self-pay

## 2021-05-11 NOTE — Telephone Encounter (Signed)
Post procedure phone call. Patient states she is doing good.  

## 2021-05-14 ENCOUNTER — Telehealth: Payer: Medicare PPO | Admitting: Pain Medicine

## 2021-05-15 MED ORDER — BUSPIRONE HCL 15 MG PO TABS: 15.0000 mg | ORAL_TABLET | Freq: Two times a day (BID) | ORAL | 0 refills | Status: DC

## 2021-05-15 NOTE — Telephone Encounter (Signed)
Called patient reviewed all information and repeated back to me. Will call if any questions.  She will call office in one month and give update.

## 2021-05-15 NOTE — Telephone Encounter (Signed)
Noted, will provide refill of Tizanidine. We can increase her buspirone to 15 mg BID, will send in new Rx to reflect. Have her update again in about one month.

## 2021-05-24 ENCOUNTER — Ambulatory Visit: Payer: Medicare PPO | Attending: Pain Medicine | Admitting: Pain Medicine

## 2021-05-24 ENCOUNTER — Other Ambulatory Visit: Payer: Self-pay

## 2021-05-24 DIAGNOSIS — M545 Low back pain, unspecified: Secondary | ICD-10-CM

## 2021-05-24 DIAGNOSIS — T85848S Pain due to other internal prosthetic devices, implants and grafts, sequela: Secondary | ICD-10-CM | POA: Diagnosis not present

## 2021-05-24 DIAGNOSIS — R937 Abnormal findings on diagnostic imaging of other parts of musculoskeletal system: Secondary | ICD-10-CM | POA: Diagnosis not present

## 2021-05-24 DIAGNOSIS — G8929 Other chronic pain: Secondary | ICD-10-CM

## 2021-05-24 DIAGNOSIS — M5416 Radiculopathy, lumbar region: Secondary | ICD-10-CM | POA: Diagnosis not present

## 2021-05-24 DIAGNOSIS — G894 Chronic pain syndrome: Secondary | ICD-10-CM | POA: Diagnosis not present

## 2021-05-24 NOTE — Progress Notes (Signed)
Patient: Karen Edwards  Service Category: E/M  Provider: Gaspar Cola, MD  DOB: 1965/12/24  DOS: 05/24/2021  Location: Office  MRN: [id number]  Setting: Ambulatory outpatient  Referring Provider: Pleas Koch, NP  Type: Established Patient  Specialty: Interventional Pain Management  PCP: Pleas Koch, NP  Location: Remote location  Delivery: TeleHealth     Virtual Encounter - Pain Management PROVIDER NOTE: Information contained herein reflects review and annotations entered in association with encounter. Interpretation of such information and data should be left to medically-trained personnel. Information provided to patient can be located elsewhere in the medical record under "Patient Instructions". Document created using STT-dictation technology, any transcriptional errors that may result from process are unintentional.    Contact & Pharmacy Preferred: [phone] Home: [phone] (home) Mobile: [phone] (mobile) E-mail: [email or web address] .[place], [place] [address] Phone: [phone] Fax: [phone]   Pre-screening  Karen Edwards offered "in-person" vs "virtual" encounter. She indicated preferring virtual for this encounter.   Reason COVID-19*  Social distancing based on CDC and AMA recommendations.   I contacted Karen Edwards on 05/24/2021 via telephone.      I clearly identified myself as Gaspar Cola, MD. I verified that I was speaking with the correct person using two identifiers (Name: Karen Edwards, and date of birth: Apr 25, 1966).  Consent I sought verbal advanced consent from Karen Edwards for virtual visit interactions. I informed Karen Edwards of possible security and privacy concerns, risks, and limitations associated with providing "not-in-person" medical evaluation and management services. I also informed Karen Edwards of the availability of "in-person"  appointments. Finally, I informed her that there would be a charge for the virtual visit and that she could be  personally, fully or partially, financially responsible for it. Karen Edwards expressed understanding and agreed to proceed.   Historic Elements   Karen Edwards is a 55 y.o. year old, female patient evaluated today after our last contact on 05/10/2021. Karen Edwards  has a past medical history of Anxiety, Chronic back pain, Degenerative joint disease, Depression, Headache, Hypotension, IBS (irritable bowel syndrome) (05/2015), and MVP (mitral valve prolapse). She also  has a past surgical history that includes Cesarean section; Ankle surgery (Right, 03/02/2014); Hemorrhoid surgery (N/A, 06/16/2015); Cervical spine surgery (02/21/2016); Cesarean section with bilateral tubal ligation (1990); Colonoscopy (04/19/2015); Hysteroscopy with D & C (12/24/2013); Colonoscopy with propofol (N/A, 05/18/2018); Esophagogastroduodenoscopy (egd) with propofol (N/A, 05/18/2018); Spinal cord stimulator insertion (N/A, 06/18/2018); Tubal ligation; and Lumbar spinal cord simulator revision (N/A, 03/14/2020). Karen Edwards has a current medication list which includes the following prescription(s): acetaminophen, biotin, buspirone, fluoxetine, gabapentin, fish oil, ondansetron, oxycodone, [START ON 06/15/2021] oxycodone, [START ON 07/15/2021] oxycodone, scopolamine, tizanidine, trazodone, and turmeric. She  reports that she has been smoking cigarettes. She has a 18.50 pack-year smoking history. She has never used smokeless tobacco. She reports current alcohol use of about 1.0 standard drink of alcohol per week. She reports that she does not use drugs. Karen Edwards has No Known Allergies.   HPI  Today, she is being contacted for a post-procedure assessment.  The patient indicates that their procedure provide her with 100% relief of the pain for the duration of the local anesthetic and the same relief extended to a period of  approximately 2 days from the injection.  However, after that the pain simply came back and currently is back to its baseline.  She  states that the pain continues to be on the left lower back right over the area of the implant and it seems to be referring some pain to that left buttocks area.  She has an appointment with the neurosurgeons next Tuesday for evaluation to see if there Inocor stimulator battery can be revised and placed elsewhere where it is not irritating her back as much is just one is.  For the time being, she is doing well on her medications and this is helped being her manage the pain.  According to our records she should have enough medication to last until 08/14/2021.  Our records also indicated that she has a return appointment for medication management on 08/13/2021.  Hopefully by then she would have had her revision and the battery of the spinal cord stimulator would not be bothering her as much.  I told the patient that should she need anything else from me to simply give [place] a call.  Has a return appointment for 08/13/2021.  Post-Procedure Evaluation  Procedure (05/10/2021):  Type: Therapeutic Inter-Laminar Epidural Steroid Injection           Region: Lumbar Level: L1-2 Level. Laterality: Left    Pre-procedure pain level: 8/10 Post-procedure: 0/10 (100% relief)  Anxiolysis: Minimal conscious anxiolysis  Effectiveness during initial hour after procedure (Ultra-Short Term Relief): 100 %.  Local anesthetic used: Long-acting (4-6 hours) Effectiveness: Defined as any analgesic benefit obtained secondary to the administration of local anesthetics. This carries significant diagnostic value as to the etiological location, or anatomical origin, of the pain. Duration of benefit is expected to coincide with the duration of the local anesthetic used.  Effectiveness during initial 4-6 hours after procedure (Short-Term Relief): 100 %.  Long-term benefit: Defined as any relief past the  pharmacologic duration of the local anesthetics.  Effectiveness past the initial 6 hours after procedure (Long-Term Relief): 100 % (lasting 2 days).  Benefits, current: Defined as benefit present at the time of this evaluation.   Analgesia:  Back to baseline Function: Back to baseline ROM: Back to baseline  Pharmacotherapy Assessment   Analgesic: Oxycodone IR 5 mg, 1 tab PO QD (5 mg/day of oxycodone) PRN MME/day: 7.5 mg/day.   Monitoring:  PMP: PDMP reviewed during this encounter.       Pharmacotherapy: No side-effects or adverse reactions reported. Compliance: No problems identified. Effectiveness: Clinically acceptable. Plan: Refer to "POC". UDS:  Summary  Date Value Ref Range Status  04/16/2021 Note  Final    Comment:    ==================================================================== [employer] Select 13 (MW) ==================================================================== Test                             Result       Flag       Units  Drug Absent but Declared for Prescription Verification   Oxycodone                      Not Detected UNEXPECTED ng/mg creat ==================================================================== Test                      Result    Flag   Units      Ref Range   Creatinine              41               mg/dL      >=20 ==================================================================== Declared Medications:  The flagging  and interpretation on this report are based on the  following declared medications.  Unexpected results may arise from  inaccuracies in the declared medications.   **Note: The testing scope of this panel includes these medications:   Oxycodone (Roxicodone)   **Note: The testing scope of this panel does not include the  following reported medications:   Acetaminophen (Tylenol)  Biotin  Buspirone (Buspar)  Fish Oil  Fluoxetine (Prozac)  Gabapentin (Neurontin)  Ondansetron (Zofran)  Scopolamine  Tizanidine   Trazodone (Desyrel)  Turmeric ==================================================================== For clinical consultation, please call [phone]. ====================================================================      Laboratory Chemistry Profile   Renal Lab Results  Component Value Date   BUN 12 02/01/2021   CREATININE 0.85 02/01/2021   BCR 8 (L) 09/08/2017   GFR 77.34 02/01/2021   GFRAA 93 09/08/2017   GFRNONAA 81 09/08/2017    Hepatic Lab Results  Component Value Date   AST 15 02/01/2021   ALT 11 02/01/2021   ALBUMIN 4.4 02/01/2021   ALKPHOS 59 02/01/2021    Electrolytes Lab Results  Component Value Date   NA 139 02/01/2021   K 4.2 02/01/2021   CL 105 02/01/2021   CALCIUM 9.3 02/01/2021   MG 2.1 09/08/2017    Bone Lab Results  Component Value Date   VD25OH 43.74 01/10/2016   25OHVITD1 51 09/08/2017   25OHVITD2 <1.0 09/08/2017   25OHVITD3 51 09/08/2017    Inflammation (CRP: Acute Phase) (ESR: Chronic Phase) Lab Results  Component Value Date   CRP 1.4 09/08/2017   ESRSEDRATE 11 09/08/2017         Note: Above Lab results reviewed.  Imaging  DG PAIN CLINIC C-ARM 1-60 MIN NO REPORT Fluoro was used, but no Radiologist interpretation will be provided.  Please refer to "NOTES" tab for provider progress note.  Assessment  The primary encounter diagnosis was Chronic low back pain (Left) w/o sciatica (implant pain). Diagnoses of Chronic lumbar radicular pain, Pain due to any device, implant or graft (SCS battery) (Left PSIS area), Abnormal MRI, lumbar spine (04/17/2021), and Chronic pain syndrome were also pertinent to this visit.  Plan of Care  Problem-specific:  No problem-specific Assessment & Plan notes found for this encounter.  Karen Edwards has a current medication list which includes the following long-term medication(s): fluoxetine, gabapentin, oxycodone, [START ON 06/15/2021] oxycodone, [START ON 07/15/2021] oxycodone, and  trazodone.  Pharmacotherapy (Medications Ordered): No orders of the defined types were placed in this encounter.  Orders:  No orders of the defined types were placed in this encounter.  Follow-up plan:   Return for scheduled encounter.     Interventional Therapies  Risk  Complexity Considerations:   WNL   Planned  Pending:   Therapeutic right lumbar facet RFA #2 (02/01/2021)    Under consideration:   Diagnostic left L2 and L3 MBB (superior cluneal nerve) #1  Possible left superior cluneal nerve RFA    Completed:   Therapeutic right cervical ESI x1 (08/24/2020) Diagnostic bilateral GONB x1 (08/19/2019) Palliative right cervical facet MBB x3 (07/23/2018)  Palliative left cervical facet MBB x4 (04/01/2019)  Palliative right cervical facet RFA x1 (09/17/2018) Palliative left cervical facet RFA x1 (05/04/2019) Palliative right L2-3 interlaminar LESI x2  Palliative right L1 TFESI x1  Bilateral lumbar spinal cord stimulator trial (done) (05/14/2018) (by me)  Bilateral lumbar spinal cord stimulator implant (09/28/2019) (by me)  Bilateral spinal cord stimulator revision (03/14/2020) (by me)  Diagnostic bilateral lumbar facet MBB x3  Diagnostic bilateral SI joint  block x2  Palliative left lumbar facet RFA x2 (03/27/2021)  Therapeutic right lumbar facet RFA x2 (02/01/2021)    Therapeutic  Palliative (PRN) options:   Therapeutic right cervical ESI #2  Diagnostic bilateral GONB #2  Palliative bilateral cervical facet blocks #5  Palliative right cervical facet RFA #2  Palliative left cervical facet RFA #2  Palliative right L2-3 interlaminar LESI #3  Palliative right L1 TFESI #2  Diagnostic bilateral lumbar facet block #4  Diagnostic bilateral SI joint block #3  Palliative left lumbar facet RFA #2  Palliative right lumbar facet RFA #3        Recent Visits Date Type Provider Dept  05/10/21 Procedure visit Milinda Pointer, MD [facility]  05/09/21 Office Visit Milinda Pointer, MD [facility]  04/24/21 Procedure visit Milinda Pointer, MD [facility]  04/16/21 Office Visit Milinda Pointer, MD [facility]  04/03/21 Office Visit Milinda Pointer, MD [facility]  03/27/21 Procedure visit Milinda Pointer, MD [facility]  02/26/21 Office Visit Milinda Pointer, MD [facility]  Showing recent visits within past 90 days and meeting all other requirements Today's Visits Date Type Provider Dept  05/24/21 Telemedicine Milinda Pointer, MD [facility]  Showing today's visits and meeting all other requirements Future Appointments Date Type Provider Dept  08/13/21 Appointment Milinda Pointer, MD [facility]  Showing future appointments within next 90 days and meeting all other requirements I discussed the assessment and treatment plan with the patient. The patient was provided an opportunity to ask questions and all were answered. The patient agreed with the plan and demonstrated an understanding of the instructions.  Patient advised to call back or seek an in-person evaluation if the symptoms or condition worsens.  Duration of encounter: 14 minutes.  Note by: Gaspar Cola, MD Date: 05/24/2021; Time: 5:20 PM

## 2021-05-29 DIAGNOSIS — G894 Chronic pain syndrome: Secondary | ICD-10-CM | POA: Diagnosis not present

## 2021-06-04 ENCOUNTER — Other Ambulatory Visit: Payer: Self-pay | Admitting: Primary Care

## 2021-06-04 DIAGNOSIS — F411 Generalized anxiety disorder: Secondary | ICD-10-CM

## 2021-06-04 DIAGNOSIS — G8929 Other chronic pain: Secondary | ICD-10-CM

## 2021-06-04 DIAGNOSIS — M7918 Myalgia, other site: Secondary | ICD-10-CM

## 2021-06-26 NOTE — Telephone Encounter (Signed)
Do you want her to come in for follow up?

## 2021-07-04 ENCOUNTER — Other Ambulatory Visit: Payer: Self-pay | Admitting: Primary Care

## 2021-07-04 DIAGNOSIS — M7918 Myalgia, other site: Secondary | ICD-10-CM

## 2021-07-04 DIAGNOSIS — G8929 Other chronic pain: Secondary | ICD-10-CM

## 2021-07-11 ENCOUNTER — Ambulatory Visit: Payer: Medicare PPO | Admitting: Pain Medicine

## 2021-07-24 DIAGNOSIS — F411 Generalized anxiety disorder: Secondary | ICD-10-CM

## 2021-07-25 MED ORDER — HYDROXYZINE HCL 10 MG PO TABS: ORAL_TABLET | ORAL | 0 refills | Status: DC

## 2021-07-25 NOTE — Telephone Encounter (Signed)
Do you want me to work her in for virtual?

## 2021-08-09 ENCOUNTER — Other Ambulatory Visit: Payer: Self-pay | Admitting: Primary Care

## 2021-08-09 DIAGNOSIS — G4700 Insomnia, unspecified: Secondary | ICD-10-CM

## 2021-08-12 NOTE — Progress Notes (Signed)
PROVIDER NOTE: Information contained herein reflects review and annotations entered in association with encounter. Interpretation of such information and data should be left to medically-trained personnel. Information provided to patient can be located elsewhere in the medical record under "Patient Instructions". Document created using STT-dictation technology, any transcriptional errors that may result from process are unintentional.    Patient: Karen Edwards  Service Category: E/M  Provider: Gaspar Cola, MD  DOB: Mar 18, 1966  DOS: 08/13/2021  Specialty: Interventional Pain Management  MRN: [id number]  Setting: Ambulatory outpatient  PCP: Karen Koch, NP  Type: Established Patient    Referring Provider: Pleas Koch, NP  Location: Office  Delivery: Face-to-face     HPI  [name], a 55 y.o. year old female, is here today because of her Chronic bilateral low back pain without sciatica [M54.50, G89.29]. Karen Edwards primary complain today is No chief complaint on file. Last encounter: My last encounter with her was on 05/10/2021. Pertinent problems: Karen Edwards has Chronic low back pain (1ry area of Pain) (Bilateral) (R>L) w/ sciatica (Bilateral); DDD (degenerative disc disease), lumbar; DDD (degenerative disc disease), cervical; Chronic occipital neuralgia (5th area of Pain) (Bilateral) (R>L); Lumbar facet syndrome (Bilateral) (L>R); DDD (degenerative disc disease), lumbosacral; Lumbar radicular pain (Right) (L4); Sacroiliac joint dysfunction (Bilateral); Chronic neck pain (4th area of Pain) (Bilateral) (R>L); Numbness and tingling of upper extremity (Right); Chronic upper extremity pain (Bilateral) (R>L); Chronic sacroiliac joint pain (Bilateral) (L>R); Lumbar facet hypertrophy (Bilateral); L1-2 disc extrusion (Right); Cervical foraminal stenosis (C5-6) (Bilateral); Chronic pain syndrome; Chronic cervical radicular pain (Right: C6/C7) (Left: C5/T1); Chronic lumbar  radicular pain; Chronic hip pain (3ry area of Pain) (Bilateral) (R>L); Chronic lower extremity pain (2ry area of Pain) (Bilateral) (R>L); Chronic shoulder pain (Bilateral) (L>R); Chronic musculoskeletal pain; Lumbar spondylosis; Cervical facet syndrome (Bilateral) (R>L); Myofascial pain; Spondylosis without myelopathy or radiculopathy, cervical region; Cervicalgia; S/P insertion of Epidural Neurostimulator (SCS) (Bilateral); Chronic neck pain with history of cervical spinal surgery; 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[facility]); Trigger point with back pain (Right); Cervicogenic headache (Bilateral); Occipital headache (Bilateral); Chronic tension-type headache, intractable; Cervico-occipital neuralgia (Bilateral); Pain due to any device, implant or graft (SCS battery) (Left PSIS area); Spondylosis without myelopathy or radiculopathy, lumbosacral region; History of cervical spinal surgery (C5-7 ACDF); Cervical radiculopathy at C6 (Bilateral); Chronic low back pain (Bilateral) w/o sciatica; Presence of neurostimulator (Thoracolumbar); Spinal cord stimulator dysfunction ([place]); Acute left-sided low back pain without sciatica; Neurogenic pain; Chronic low back pain (Left) w/o sciatica (implant pain); Cluneal neuropathy (Left); and Abnormal MRI, lumbar spine (04/17/2021) on their pertinent problem list. Pain Assessment: Severity of Chronic pain is reported as a 8 /10. Location: Back Lower, Upper/right arm to elbow. Onset: More than a month ago. Quality: Aching, Burning, Constant, Stabbing. Timing: Constant. Modifying factor(s): positioning, medication, heat. Vitals:  height is $RemoveB'5\' 1"'eXCkrmth$  (1.549 m) and weight is 148 lb (67.1 kg). Her temporal temperature is 97.5 F (36.4 C) (abnormal). Her blood pressure is 121/76 and her pulse is 76. Her respiration is 16 and oxygen saturation is 100%.   Reason for encounter: medication management. The patient indicates doing well with the current medication regimen. No adverse  reactions or side effects reported to the medications.   According to the patient's PMP, the last time that she had her oxycodone refilled was on 04/16/2021.  At that time, I wrote for 3 different oxycodone prescriptions, but she has only filled 1/3.  As it turns out, she had gone to the  pharmacy and requested the new prescriptions but she was told that there were no other prescriptions on file.  However, when we called today, the pharmacist indicated that there was a prescription, which I had them cancel and iced sent 3 new ones.  Because of the problems created by the pharmacy, today's visit took a lot longer than expected.  I had to provide the patient with some information as to what to do if the same thing occurred again.   Pharmacotherapy Assessment  Analgesic: Oxycodone IR 5 mg, 1 tab PO QD (5 mg/day of oxycodone) PRN MME/day: 7.5 mg/day.   Monitoring: [place] PMP: PDMP reviewed during this encounter.       Pharmacotherapy: No side-effects or adverse reactions reported. Compliance: No problems identified. Effectiveness: Clinically acceptable.  Hart Rochester, RN  08/13/2021  2:52 PM  Sign when Signing Visit Nursing Pain Medication Assessment:  Safety precautions to be maintained throughout the outpatient stay will include: orient to surroundings, keep bed in low position, maintain call bell within reach at all times, provide assistance with transfer out of bed and ambulation.  Medication Inspection Compliance: Karen Edwards did not comply with our request to bring her pills to be counted. She was reminded that bringing the medication bottles, even when empty, is a requirement.  Medication: Oxycodone IR Pill/Patch Count: No pills available to be counted. Pill/Patch Appearance:  did not bring pills Bottle Appearance: No container available. Did not bring bottle(s) to appointment. Filled Date: 04/16/2021 Last Medication intake:  Day before yesterday    UDS:  Summary  Date Value Ref  Range Status  04/16/2021 Note  Final    Comment:    ==================================================================== [employer] Select 13 (MW) ==================================================================== Test                             Result       Flag       Units  Drug Absent but Declared for Prescription Verification   Oxycodone                      Not Detected UNEXPECTED ng/mg creat ==================================================================== Test                      Result    Flag   Units      Ref Range   Creatinine              41               mg/dL      >=20 ==================================================================== Declared Medications:  The flagging and interpretation on this report are based on the  following declared medications.  Unexpected results may arise from  inaccuracies in the declared medications.   **Note: The testing scope of this panel includes these medications:   Oxycodone (Roxicodone)   **Note: The testing scope of this panel does not include the  following reported medications:   Acetaminophen (Tylenol)  Biotin  Buspirone (Buspar)  Fish Oil  Fluoxetine (Prozac)  Gabapentin (Neurontin)  Ondansetron (Zofran)  Scopolamine  Tizanidine  Trazodone (Desyrel)  Turmeric ==================================================================== For clinical consultation, please call [phone]. ====================================================================      ROS  Constitutional: Denies any fever or chills Gastrointestinal: No reported hemesis, hematochezia, vomiting, or acute GI distress Musculoskeletal: Denies any acute onset joint swelling, redness, loss of ROM, or weakness Neurological: No reported episodes of acute onset apraxia, aphasia, dysarthria, agnosia,  amnesia, paralysis, loss of coordination, or loss of consciousness  Medication Review  FLUoxetine, Fish Oil, acetaminophen, busPIRone, diazepam,  gabapentin, hydrOXYzine, ondansetron, oxyCODONE, scopolamine, tiZANidine, and traZODone  History Review  Allergy: Karen Edwards is allergic to gabapentin. Drug: Karen Edwards  reports no history of drug use. Alcohol:  reports current alcohol use of about 1.0 standard drink per week. Tobacco:  reports that she has been smoking cigarettes. She has a 18.50 pack-year smoking history. She has never used smokeless tobacco. Social: Karen Edwards  reports that she has been smoking cigarettes. She has a 18.50 pack-year smoking history. She has never used smokeless tobacco. She reports current alcohol use of about 1.0 standard drink per week. She reports that she does not use drugs. Medical:  has a past medical history of Anxiety, Chronic back pain, Degenerative joint disease, Depression, Headache, Hypotension, IBS (irritable bowel syndrome) (05/2015), and MVP (mitral valve prolapse). Surgical: Ms. Bascomb  has a past surgical history that includes Cesarean section; Ankle surgery (Right, 03/02/2014); Hemorrhoid surgery (N/A, 06/16/2015); Cervical spine surgery (02/21/2016); Cesarean section with bilateral tubal ligation (1990); Colonoscopy (04/19/2015); Hysteroscopy with D & C (12/24/2013); Colonoscopy with propofol (N/A, 05/18/2018); Esophagogastroduodenoscopy (egd) with propofol (N/A, 05/18/2018); Spinal cord stimulator insertion (N/A, 06/18/2018); Tubal ligation; and Lumbar spinal cord simulator revision (N/A, 03/14/2020). Family: family history includes Arthritis in her mother; Congestive Heart Failure in her maternal grandfather; Depression in her mother; Diabetes in her maternal aunt; Diabetes Mellitus I in her maternal grandmother; Gout in her maternal aunt; Hearing loss in her father; Heart attack in her maternal grandfather; Hyperlipidemia in her father and mother; Hypertension in her father and mother; Hypothyroidism in her mother; Kidney disease in her mother; Prostate cancer in her paternal grandfather; Stroke  in her maternal grandmother; Thyroid disease in her maternal aunt; Vision loss in her mother.  Laboratory Chemistry Profile   Renal Lab Results  Component Value Date   BUN 12 02/01/2021   CREATININE 0.85 02/01/2021   BCR 8 (L) 09/08/2017   GFR 77.34 02/01/2021   GFRAA 93 09/08/2017   GFRNONAA 81 09/08/2017    Hepatic Lab Results  Component Value Date   AST 15 02/01/2021   ALT 11 02/01/2021   ALBUMIN 4.4 02/01/2021   ALKPHOS 59 02/01/2021    Electrolytes Lab Results  Component Value Date   NA 139 02/01/2021   K 4.2 02/01/2021   CL 105 02/01/2021   CALCIUM 9.3 02/01/2021   MG 2.1 09/08/2017    Bone Lab Results  Component Value Date   VD25OH 43.74 01/10/2016   25OHVITD1 51 09/08/2017   25OHVITD2 <1.0 09/08/2017   25OHVITD3 51 09/08/2017    Inflammation (CRP: Acute Phase) (ESR: Chronic Phase) Lab Results  Component Value Date   CRP 1.4 09/08/2017   ESRSEDRATE 11 09/08/2017         Note: Above Lab results reviewed.  Recent Imaging Review  DG PAIN CLINIC C-ARM 1-60 MIN NO REPORT Fluoro was used, but no Radiologist interpretation will be provided.  Please refer to "NOTES" tab for provider progress note. Note: Reviewed        Physical Exam  General appearance: Well nourished, well developed, and well hydrated. In no apparent acute distress Mental status: Alert, oriented x 3 (person, place, & time)       Respiratory: No evidence of acute respiratory distress Eyes: PERLA Vitals: BP 121/76 (BP Location: Left Arm, Patient Position: Sitting, Cuff Size: Normal)   Pulse 76   Temp (!) 97.5 F (36.4  C) (Temporal)   Resp 16   Ht 5' 1"  (1.549 m)   Wt 148 lb (67.1 kg)   LMP 05/05/2017 (Exact Date)   SpO2 100%   BMI 27.96 kg/m  BMI: Estimated body mass index is 27.96 kg/m as calculated from the following:   Height as of this encounter: 5' 1"  (1.549 m).   Weight as of this encounter: 148 lb (67.1 kg). Ideal: Ideal body weight: 47.8 kg (105 lb 6.1 oz) Adjusted ideal  body weight: 55.5 kg (122 lb 6.8 oz)  Assessment   Status Diagnosis  Controlled Controlled Controlled 1. Chronic low back pain (Bilateral) w/o sciatica   2. Pharmacologic therapy   3. Chronic use of opiate for therapeutic purpose   4. Chronic sacroiliac joint pain (Bilateral) (L>R)   5. Chronic hip pain (3ry area of Pain) (Bilateral) (R>L)   6. Chronic pain syndrome   7. Anxiety due to invasive procedure   8. Chronic lower extremity pain (2ry area of Pain) (Bilateral) (R>L)      Updated Problems: No problems updated.  Plan of Care  Problem-specific:  No problem-specific Assessment & Plan notes found for this encounter.  Karen Edwards has a current medication list which includes the following long-term medication(s): fluoxetine, gabapentin, gabapentin, trazodone, oxycodone, [START ON 09/12/2021] oxycodone, and [START ON 10/12/2021] oxycodone.  Pharmacotherapy (Medications Ordered): Meds ordered this encounter  Medications   diazepam (VALIUM) 10 MG tablet    Sig: Take 1 tablet (10 mg total) by mouth 60 (sixty) minutes before procedure for 1 dose. Take with a sip of water, on an empty stomach. Do not eat anything for 6 hours prior to procedure.    Dispense:  1 tablet    Refill:  0    Must have a driver. Do not drive or operate machinery x 24 hours after taking this medication. Avoid taking within 4 hours of having taken an opioid pain medications.   oxyCODONE (OXY IR/ROXICODONE) 5 MG immediate release tablet    Sig: Take 1 tablet (5 mg total) by mouth 2 (two) times daily as needed for severe pain. Must last 30 days.    Dispense:  40 tablet    Refill:  0    DO NOT: delete (not duplicate); no partial-fill (will deny script to complete), no refill request (F/U required). DISPENSE: 1 day early if closed on fill date. WARN: No CNS-depressants within 8 hrs of med.   oxyCODONE (OXY IR/ROXICODONE) 5 MG immediate release tablet    Sig: Take 1 tablet (5 mg total) by mouth 2 (two)  times daily as needed for severe pain. Must last 30 days.    Dispense:  40 tablet    Refill:  0    DO NOT: delete (not duplicate); no partial-fill (will deny script to complete), no refill request (F/U required). DISPENSE: 1 day early if closed on fill date. WARN: No CNS-depressants within 8 hrs of med.   oxyCODONE (OXY IR/ROXICODONE) 5 MG immediate release tablet    Sig: Take 1 tablet (5 mg total) by mouth 2 (two) times daily as needed for severe pain. Must last 30 days.    Dispense:  40 tablet    Refill:  0    DO NOT: delete (not duplicate); no partial-fill (will deny script to complete), no refill request (F/U required). DISPENSE: 1 day early if closed on fill date. WARN: No CNS-depressants within 8 hrs of med.    Orders:  Orders Placed This Encounter  Procedures  SACROILIAC JOINT INJECTION    Standing Status:   Future    Standing Expiration Date:   11/13/2021    Scheduling Instructions:     Side: Bilateral     Sedation: Patient's choice.     Timeframe: ASAP    Order Specific Question:   Where will this procedure be performed?    Answer:   [facility]   HIP INJECTION    Standing Status:   Future    Standing Expiration Date:   11/13/2021    Scheduling Instructions:     Side: Bilateral     Sedation: Patient's choice.     Timeframe: As soon as schedule allows   Informed Consent Details: Physician/Practitioner Attestation; Transcribe to consent form and obtain patient signature    Nursing Order: Transcribe to consent form and obtain patient signature. Note: Always confirm laterality of pain with Karen Edwards, before procedure.    Order Specific Question:   Physician/Practitioner attestation of informed consent for procedure/surgical case    Answer:   I, the physician/practitioner, attest that I have discussed with the patient the benefits, risks, side effects, alternatives, likelihood of achieving goals and potential problems during recovery for the procedure that I have  provided informed consent.    Order Specific Question:   Procedure    Answer:   Sacroiliac Joint Block    Order Specific Question:   Physician/Practitioner performing the procedure    Answer:   [place] Maiorino A. Dossie Arbour, MD    Order Specific Question:   Indication/Reason    Answer:   Chronic Low Back and Hip Pain secondary to Sacroiliac Joint Pain (Arthralgia/Arthropathy)   Informed Consent Details: Physician/Practitioner Attestation; Transcribe to consent form and obtain patient signature    Nursing Order: Transcribe to consent form and obtain patient signature. Note: Always confirm laterality of pain with Karen Edwards, before procedure.    Order Specific Question:   Physician/Practitioner attestation of informed consent for procedure/surgical case    Answer:   I, the physician/practitioner, attest that I have discussed with the patient the benefits, risks, side effects, alternatives, likelihood of achieving goals and potential problems during recovery for the procedure that I have provided informed consent.    Order Specific Question:   Procedure    Answer:   Hip injection    Order Specific Question:   Physician/Practitioner performing the procedure    Answer:   Zakaria Fromer A. Dossie Arbour, MD    Order Specific Question:   Indication/Reason    Answer:   Hip Joint Pain (Arthralgia)    Follow-up plan:   Return for (Clinic) procedure: (B) Hip  + (B) SI BLK, ValiumRx-given.     Interventional Therapies  Risk  Complexity Considerations:   WNL   Planned  Pending:   Therapeutic right lumbar facet RFA #2 (02/01/2021)    Under consideration:   Diagnostic left L2 and L3 MBB (superior cluneal nerve) #1  Possible left superior cluneal nerve RFA    Completed:   Therapeutic right cervical ESI x1 (08/24/2020) Diagnostic bilateral GONB x1 (08/19/2019) Palliative right cervical facet MBB x3 (07/23/2018)  Palliative left cervical facet MBB x4 (04/01/2019)  Palliative right cervical facet RFA x1  (09/17/2018) Palliative left cervical facet RFA x1 (05/04/2019) Palliative right L2-3 interlaminar LESI x2  Palliative right L1 TFESI x1  Bilateral lumbar spinal cord stimulator trial (done) (05/14/2018) (by me)  Bilateral lumbar spinal cord stimulator implant (09/28/2019) (by me)  Bilateral spinal cord stimulator revision (03/14/2020) (by me)  Diagnostic bilateral lumbar facet  MBB x3  Diagnostic bilateral SI joint block x2  Palliative left lumbar facet RFA x2 (03/27/2021)  Therapeutic right lumbar facet RFA x2 (02/01/2021)    Therapeutic  Palliative (PRN) options:   Therapeutic right cervical ESI #2  Diagnostic bilateral GONB #2  Palliative bilateral cervical facet blocks #5  Palliative right cervical facet RFA #2  Palliative left cervical facet RFA #2  Palliative right L2-3 interlaminar LESI #3  Palliative right L1 TFESI #2  Diagnostic bilateral lumbar facet block #4  Diagnostic bilateral SI joint block #3  Palliative left lumbar facet RFA #2  Palliative right lumbar facet RFA #3     Recent Visits Date Type Provider Dept  05/24/21 Telemedicine Milinda Pointer, MD [facility]  Showing recent visits within past 90 days and meeting all other requirements Today's Visits Date Type Provider Dept  08/13/21 Office Visit Milinda Pointer, MD [facility]  Showing today's visits and meeting all other requirements Future Appointments Date Type Provider Dept  08/23/21 Appointment Milinda Pointer, MD [facility]  11/05/21 Appointment Milinda Pointer, MD [facility]  Showing future appointments within next 90 days and meeting all other requirements I discussed the assessment and treatment plan with the patient. The patient was provided an opportunity to ask questions and all were answered. The patient agreed with the plan and demonstrated an understanding of the instructions.  Patient advised to call back or seek an in-person evaluation if the  symptoms or condition worsens.  Duration of encounter: 30 minutes.  Note by: Gaspar Cola, MD Date: 08/13/2021; Time: 5:43 PM

## 2021-08-13 ENCOUNTER — Encounter: Payer: Self-pay | Admitting: Pain Medicine

## 2021-08-13 ENCOUNTER — Other Ambulatory Visit: Payer: Self-pay

## 2021-08-13 ENCOUNTER — Ambulatory Visit: Payer: Medicare PPO | Attending: Pain Medicine | Admitting: Pain Medicine

## 2021-08-13 VITALS — BP 121/76 | HR 76 | Temp 97.5°F | Resp 16 | Ht 61.0 in | Wt 148.0 lb

## 2021-08-13 DIAGNOSIS — Z79891 Long term (current) use of opiate analgesic: Secondary | ICD-10-CM | POA: Diagnosis not present

## 2021-08-13 DIAGNOSIS — M79605 Pain in left leg: Secondary | ICD-10-CM

## 2021-08-13 DIAGNOSIS — M545 Low back pain, unspecified: Secondary | ICD-10-CM | POA: Diagnosis not present

## 2021-08-13 DIAGNOSIS — Z79899 Other long term (current) drug therapy: Secondary | ICD-10-CM | POA: Diagnosis not present

## 2021-08-13 DIAGNOSIS — F419 Anxiety disorder, unspecified: Secondary | ICD-10-CM | POA: Insufficient documentation

## 2021-08-13 DIAGNOSIS — M79604 Pain in right leg: Secondary | ICD-10-CM | POA: Diagnosis not present

## 2021-08-13 DIAGNOSIS — G8929 Other chronic pain: Secondary | ICD-10-CM | POA: Insufficient documentation

## 2021-08-13 DIAGNOSIS — M25551 Pain in right hip: Secondary | ICD-10-CM | POA: Diagnosis not present

## 2021-08-13 DIAGNOSIS — M25552 Pain in left hip: Secondary | ICD-10-CM

## 2021-08-13 DIAGNOSIS — G894 Chronic pain syndrome: Secondary | ICD-10-CM | POA: Insufficient documentation

## 2021-08-13 DIAGNOSIS — M533 Sacrococcygeal disorders, not elsewhere classified: Secondary | ICD-10-CM | POA: Insufficient documentation

## 2021-08-13 MED ORDER — OXYCODONE HCL 5 MG PO TABS: 5.0000 mg | ORAL_TABLET | Freq: Two times a day (BID) | ORAL | 0 refills | Status: DC | PRN

## 2021-08-13 MED ORDER — OXYCODONE HCL 5 MG PO TABS
5.0000 mg | ORAL_TABLET | Freq: Two times a day (BID) | ORAL | 0 refills | Status: DC | PRN
Start: 2021-09-12 — End: 2021-11-01

## 2021-08-13 MED ORDER — DIAZEPAM 10 MG PO TABS: 10.0000 mg | ORAL_TABLET | ORAL | 0 refills | Status: DC

## 2021-08-13 NOTE — Patient Instructions (Signed)
Sacroiliac (SI) Joint Injection Patient Information  Description: The sacroiliac joint connects the scrum (very low back and tailbone) to the ilium (a pelvic bone which also forms half of the hip joint).  Normally this joint experiences very little motion.  When this joint becomes inflamed or unstable low back and or hip and pelvis pain may result.  Injection of this joint with local anesthetics (numbing medicines) and steroids can provide diagnostic information and reduce pain.  This injection is performed with the aid of x-ray guidance into the tailbone area while you are lying on your stomach.   You may experience an electrical sensation down the leg while this is being done.  You may also experience numbness.  We also may ask if we are reproducing your normal pain during the injection.  Conditions which may be treated SI injection:  Low back, buttock, hip or leg pain  Preparation for the Injection:  Do not eat any solid food or dairy products within 8 hours of your appointment.  You may drink clear liquids up to 3 hours before appointment.  Clear liquids include water, black coffee, juice or soda.  No milk or cream please. You may take your regular medications, including pain medications with a sip of water before your appointment.  Diabetics should hold regular insulin (if take separately) and take 1/2 normal NPH dose the morning of the procedure.  Carry some sugar containing items with you to your appointment. A driver must accompany you and be prepared to drive you home after your procedure. Bring all of your current medications with you. An IV may be inserted and sedation may be given at the discretion of the physician. A blood pressure cuff, EKG and other monitors will often be applied during the procedure.  Some patients may need to have extra oxygen administered for a short period.  You will be asked to provide medical information, including your allergies, prior to the procedure.  We  must know immediately if you are taking blood thinners (like Coumadin/Warfarin) or if you are allergic to IV iodine contrast (dye).  We must know if you could possible be pregnant.  Possible side effects:  Bleeding from needle site Infection (rare, may require surgery) Nerve injury (rare) Numbness & tingling (temporary) A brief convulsion or seizure Light-headedness (temporary) Pain at injection site (several days) Decreased blood pressure (temporary) Weakness in the leg (temporary)   Call if you experience:  New onset weakness or numbness of an extremity below the injection site that last more than 8 hours. Hives or difficulty breathing ( go to the emergency room) Inflammation or drainage at the injection site Any new symptoms which are concerning to you  Please note:  Although the local anesthetic injected can often make your back/ hip/ buttock/ leg feel good for several hours after the injections, the pain will likely return.  It takes 3-7 days for steroids to work in the sacroiliac area.  You may not notice any pain relief for at least that one week.  If effective, we will often do a series of three injections spaced 3-6 weeks apart to maximally decrease your pain.  After the initial series, we generally will wait some months before a repeat injection of the same type.  If you have any questions, please call [phone] [facility]  Pain Management Discharge Instructions  General Discharge Instructions :  If you need to reach your doctor call: Monday-Friday 8:00 am - 4:00 pm at   [phone] or toll free [phone].  After clinic hours [phone] to have operator reach doctor.  Bring all of your medication bottles to all your appointments in the pain clinic.  To cancel or reschedule your appointment with Pain Management please remember to call 24 hours in advance to avoid a fee.  Refer to the educational materials which you have  been given on: General Risks, I had my Procedure. Discharge Instructions, Post Sedation.  Post Procedure Instructions:  The drugs you were given will stay in your system until tomorrow, so for the next 24 hours you should not drive, make any legal decisions or drink any alcoholic beverages.  You may eat anything you prefer, but it is better to start with liquids then soups and crackers, and gradually work up to solid foods.  Please notify your doctor immediately if you have any unusual bleeding, trouble breathing or pain that is not related to your normal pain.  Depending on the type of procedure that was done, some parts of your body may feel week and/or numb.  This usually clears up by tonight or the next day.  Walk with the use of an assistive device or accompanied by an adult for the 24 hours.  You may use ice on the affected area for the first 24 hours.  Put ice in a Ziploc bag and cover with a towel and place against area 15 minutes on 15 minutes off.  You may switch to heat after 24 hours.Trigger Point Injection Trigger points are areas where you have pain. A trigger point injection is a shot given in the trigger point to help relieve pain for a few days to a few months. Common places for trigger points include the neck, shoulders, upper back, or lower back. A trigger point injection will not cure long-term (chronic) pain permanently. These injections do not always work for every person. For some people, they can help to relieve pain for a few days to a few months. Tell a health care provider about: Any allergies you have. All medicines you are taking, including vitamins, herbs, eye drops, creams, and over-the-counter medicines. Any problems you or family members have had with anesthetic medicines. Any bleeding problems you have. Any surgeries you have had. Any medical conditions you have. Whether you are pregnant or may be pregnant. What are the risks? Generally, this is a safe  procedure. However, problems may occur, including: Infection. Bleeding or bruising. Allergic reaction to the injected medicine. Irritation of the skin around the injection site. What happens before the procedure? Ask your health care provider about: Changing or stopping your regular medicines. This is especially important if you are taking diabetes medicines or blood thinners. Taking medicines such as aspirin and ibuprofen. These medicines can thin your blood. Do not take these medicines unless your health care provider tells you to take them. Taking over-the-counter medicines, vitamins, herbs, and supplements. What happens during the procedure?  Your health care provider will feel for trigger points. A marker may be used to circle the area for the injection. The skin over the trigger point will be washed with a germ-killing soap. You may be given a medicine to help you relax (sedative). A thin needle is used for the injection. You may feel pain or a twitching feeling when the needle enters your skin. A numbing solution may be injected into the trigger point. Sometimes a medicine to keep down inflammation is also injected. Your health care provider may move the needle around the  area where the trigger point is located until the tightness and twitching goes away. After the injection, your health care provider may put gentle pressure over the injection site. The injection site will be covered with a bandage (dressing). The procedure may vary among health care providers and hospitals. What can I expect after treatment? After treatment, you may have soreness and stiffness for 1-2 days. Follow these instructions at home: Injection site care Remove your dressing in a few hours, or as told by your health care provider. Check your injection site every day for signs of infection. Check for: Redness, swelling, or pain. Fluid or blood. Warmth. Pus or a bad smell. Managing pain, stiffness, and  swelling If directed, put ice on the affected area. To do this: Put ice in a plastic bag. Place a towel between your skin and the bag. Leave the ice on for 20 minutes, 2-3 times a day. Remove the ice if your skin turns bright red. This is very important. If you cannot feel pain, heat, or cold, you have a greater risk of damage to the area. Activity If you were given a sedative during the procedure, it can affect you for several hours. Do not drive or operate machinery until your health care provider says that it is safe. Do not take baths, swim, or use a hot tub until your health care provider approves. Return to your normal activities as told by your health care provider. Ask your health care provider what activities are safe for you. General instructions If you were asked to stop your regular medicines, ask your health care provider when you may start taking them again. You may be asked to see an occupational or physical therapist for exercises that reduce muscle strain and stretch the area of the trigger point. Keep all follow-up visits. This is important. Contact a health care provider if: Your pain comes back, and it is worse than before the injection. You may need more injections. You have chills or a fever. The injection site becomes more painful, red, swollen, or warm to the touch. Summary A trigger point injection is a shot given in the trigger point to help relieve pain. Common places for trigger point injections are the neck, shoulders, upper back, and lower back. These injections do not always work for every person, but for some people, the injections can help to relieve pain for a few days to a few months. Contact a health care provider if symptoms come back or if they are worse than before treatment. Also, get help if the injection site becomes more painful, red, swollen, or warm to the touch. This information is not intended to replace advice given to you by your health care  provider. Make sure you discuss any questions you have with your health care provider. Document Revised: 01/23/2021 Document Reviewed: 01/23/2021 [employer] Patient Education  [place].

## 2021-08-13 NOTE — Progress Notes (Signed)
Nursing Pain Medication Assessment:  Safety precautions to be maintained throughout the outpatient stay will include: orient to surroundings, keep bed in low position, maintain call bell within reach at all times, provide assistance with transfer out of bed and ambulation.  Medication Inspection Compliance: Ms. Baltes did not comply with our request to bring her pills to be counted. She was reminded that bringing the medication bottles, even when empty, is a requirement.  Medication: Oxycodone IR Pill/Patch Count: No pills available to be counted. Pill/Patch Appearance:  did not bring pills Bottle Appearance: No container available. Did not bring bottle(s) to appointment. Filled Date: ? / ? / 2022 Last Medication intake:  Day before yesterday

## 2021-08-15 ENCOUNTER — Other Ambulatory Visit: Payer: Self-pay | Admitting: Primary Care

## 2021-08-15 DIAGNOSIS — G8929 Other chronic pain: Secondary | ICD-10-CM

## 2021-08-15 DIAGNOSIS — F411 Generalized anxiety disorder: Secondary | ICD-10-CM

## 2021-08-15 DIAGNOSIS — M7918 Myalgia, other site: Secondary | ICD-10-CM

## 2021-08-23 ENCOUNTER — Ambulatory Visit: Payer: Medicare PPO | Admitting: Pain Medicine

## 2021-08-27 NOTE — Progress Notes (Deleted)
PROVIDER NOTE: Information contained herein reflects review and annotations entered in association with encounter. Interpretation of such information and data should be left to medically-trained personnel. Information provided to patient can be located elsewhere in the medical record under "Patient Instructions". Document created using STT-dictation technology, any transcriptional errors that may result from process are unintentional.    Patient: Karen Edwards  Service Category: Procedure Provider: Gaspar Cola, MD DOB: December 12, 1965 DOS: 08/28/2021 Location: [facility] MRN: [id number] Setting: Ambulatory - outpatient Referring Provider: Pleas Koch, NP Type: Established Patient Specialty: Interventional Pain Management PCP: Pleas Koch, NP  Primary Reason for Visit: Interventional Pain Management Treatment. CC: No chief complaint on file.    Procedure:          Anesthesia, Analgesia, Anxiolysis:  Type: Intra-Articular Hip Injection  ***   Primary Purpose: Diagnostic Region: Posterolateral hip joint area. Level: Lower pelvic and hip joint level. Target Area: Superior aspect of the hip joint cavity, going thru the superior portion of the capsular ligament. Approach: Posterolateral approach. Laterality:  ***   Anesthesia: Local (1-2% Lidocaine)  Anxiolysis: None  Sedation: None  Guidance: Fluoroscopy           Position: Lateral Decubitus with bad side up Prepped Area: Entire Posterolateral hip area. DuraPrep (Iodine Povacrylex [0.7% available iodine] and Isopropyl Alcohol, 74% w/w)   Indications: No diagnosis found. Pain Score: Pre-procedure:  /10 Post-procedure:  /10     Pre-op H&P Assessment:  Karen Edwards is a 55 y.o. (year old), female patient, seen today for interventional treatment. She  has a past surgical history that includes Cesarean section; Ankle surgery (Right, 03/02/2014); Hemorrhoid surgery (N/A, 06/16/2015); Cervical spine surgery  (02/21/2016); Cesarean section with bilateral tubal ligation (1990); Colonoscopy (04/19/2015); Hysteroscopy with D & C (12/24/2013); Colonoscopy with propofol (N/A, 05/18/2018); Esophagogastroduodenoscopy (egd) with propofol (N/A, 05/18/2018); Spinal cord stimulator insertion (N/A, 06/18/2018); Tubal ligation; and Lumbar spinal cord simulator revision (N/A, 03/14/2020). Karen Edwards has a current medication list which includes the following prescription(s): acetaminophen, buspirone, diazepam, fluoxetine, gabapentin, hydroxyzine, fish oil, ondansetron, oxycodone, [START ON 09/12/2021] oxycodone, [START ON 10/12/2021] oxycodone, scopolamine, tizanidine, and trazodone. Her primarily concern today is the No chief complaint on file.  Initial Vital Signs:  Pulse/HCG Rate:    Temp:   Resp:   BP:   SpO2:    BMI: Estimated body mass index is 27.96 kg/m as calculated from the following:   Height as of 08/13/21: 5\' 1"  (1.549 m).   Weight as of 08/13/21: 148 lb (67.1 kg).  Risk Assessment: Allergies: Reviewed. She is allergic to gabapentin.  Allergy Precautions: None required Coagulopathies: Reviewed. None identified.  Blood-thinner therapy: None at this time Active Infection(s): Reviewed. None identified. Karen Edwards is afebrile  Site Confirmation: Karen Edwards was asked to confirm the procedure and laterality before marking the site Procedure checklist: Completed Consent: Before the procedure and under the influence of no sedative(s), amnesic(s), or anxiolytics, the patient was informed of the treatment options, risks and possible complications. To fulfill our ethical and legal obligations, as recommended by the American Medical Association's Code of Ethics, I have informed the patient of my clinical impression; the nature and purpose of the treatment or procedure; the risks, benefits, and possible complications of the intervention; the alternatives, including doing nothing; the risk(s) and benefit(s) of  the alternative treatment(s) or procedure(s); and the risk(s) and benefit(s) of doing nothing. The patient was provided information about the general risks and possible complications associated with  the procedure. These may include, but are not limited to: failure to achieve desired goals, infection, bleeding, organ or nerve damage, allergic reactions, paralysis, and death. In addition, the patient was informed of those risks and complications associated to the procedure, such as failure to decrease pain; infection; bleeding; organ or nerve damage with subsequent damage to sensory, motor, and/or autonomic systems, resulting in permanent pain, numbness, and/or weakness of one or several areas of the body; allergic reactions; (i.e.: anaphylactic reaction); and/or death. Furthermore, the patient was informed of those risks and complications associated with the medications. These include, but are not limited to: allergic reactions (i.e.: anaphylactic or anaphylactoid reaction(s)); adrenal axis suppression; blood sugar elevation that in diabetics may result in ketoacidosis or comma; water retention that in patients with history of congestive heart failure may result in shortness of breath, pulmonary edema, and decompensation with resultant heart failure; weight gain; swelling or edema; medication-induced neural toxicity; particulate matter embolism and blood vessel occlusion with resultant organ, and/or nervous system infarction; and/or aseptic necrosis of one or more joints. Finally, the patient was informed that Medicine is not an exact science; therefore, there is also the possibility of unforeseen or unpredictable risks and/or possible complications that may result in a catastrophic outcome. The patient indicated having understood very clearly. We have given the patient no guarantees and we have made no promises. Enough time was given to the patient to ask questions, all of which were answered to the patient's  satisfaction. Karen Edwards has indicated that she wanted to continue with the procedure. Attestation: I, the ordering provider, attest that I have discussed with the patient the benefits, risks, side-effects, alternatives, likelihood of achieving goals, and potential problems during recovery for the procedure that I have provided informed consent. Date  Time: {CHL [facility]IN TIME CHOICES:21018001}  Pre-Procedure Preparation:  Monitoring: As per clinic protocol. Respiration, ETCO2, SpO2, BP, heart rate and rhythm monitor placed and checked for adequate function Safety Precautions: Patient was assessed for positional comfort and pressure points before starting the procedure. Time-out: I initiated and conducted the "Time-out" before starting the procedure, as per protocol. The patient was asked to participate by confirming the accuracy of the "Time Out" information. Verification of the correct person, site, and procedure were performed and confirmed by me, the nursing staff, and the patient. "Time-out" conducted as per Joint Commission's Universal Protocol (UP.01.01.01). Time:    Description of Procedure:          Safety Precautions: Aspiration looking for blood return was conducted prior to all injections. At no point did we inject any substances, as a needle was being advanced. No attempts were made at seeking any paresthesias. Safe injection practices and needle disposal techniques used. Medications properly checked for expiration dates. SDV (single dose vial) medications used. Description of the Procedure: Protocol guidelines were followed. The patient was placed in position over the fluoroscopy table. The target area was identified and the area prepped in the usual manner. Skin & deeper tissues infiltrated with local anesthetic. Appropriate amount of time allowed to pass for local anesthetics to take effect. The procedure needles were then advanced to the target area. Proper needle placement secured.  Negative aspiration confirmed. Solution injected in intermittent fashion, asking for systemic symptoms every 0.5cc of injectate. The needles were then removed and the area cleansed, making sure to leave some of the prepping solution back to take advantage of its long term bactericidal properties. There were no vitals filed for this visit.  Start Time:  hrs. End Time:   hrs. Materials:  Needle(s) Type: Spinal Needle Gauge: 22G Length: 7.0-in Medication(s): Please see orders for medications and dosing details.  Imaging Guidance (Non-Spinal):          Type of Imaging Technique: Fluoroscopy Guidance (Non-Spinal) Indication(s): Assistance in needle guidance and placement for procedures requiring needle placement in or near specific anatomical locations not easily accessible without such assistance. Exposure Time: Please see nurses notes. Contrast: Before injecting any contrast, we confirmed that the patient did not have an allergy to iodine, shellfish, or radiological contrast. Once satisfactory needle placement was completed at the desired level, radiological contrast was injected. Contrast injected under live fluoroscopy. No contrast complications. See chart for type and volume of contrast used. Fluoroscopic Guidance: I was personally present during the use of fluoroscopy. "Tunnel Vision Technique" used to obtain the best possible view of the target area. Parallax error corrected before commencing the procedure. "Direction-depth-direction" technique used to introduce the needle under continuous pulsed fluoroscopy. Once target was reached, antero-posterior, oblique, and lateral fluoroscopic projection used confirm needle placement in all planes. Images permanently stored in EMR. Interpretation: I personally interpreted the imaging intraoperatively. Adequate needle placement confirmed in multiple planes. Appropriate spread of contrast into desired area was observed. No evidence of afferent or efferent  intravascular uptake. Permanent images saved into the patient's record.   Procedure:          Anesthesia, Analgesia, Anxiolysis:  Type: Diagnostic Sacroiliac Joint Steroid Injection          Region: Superior Lumbosacral Region Level: PSIS (Posterior Superior Iliac Spine) Laterality:  ***   Anesthesia: Local (1-2% Lidocaine)  Anxiolysis: None  Sedation: None  Guidance: Fluoroscopy           Position: Prone           Indications: No diagnosis found. Pain Score: Pre-procedure:  /10 Post-procedure:  /10      Description of Procedure:          Target Area: Superior, posterior, aspect of the sacroiliac fissure Approach: Posterior, paraspinal, ipsilateral approach. Area Prepped: Entire Lower Lumbosacral Region DuraPrep (Iodine Povacrylex [0.7% available iodine] and Isopropyl Alcohol, 74% w/w) Safety Precautions: Aspiration looking for blood return was conducted prior to all injections. At no point did we inject any substances, as a needle was being advanced. No attempts were made at seeking any paresthesias. Safe injection practices and needle disposal techniques used. Medications properly checked for expiration dates. SDV (single dose vial) medications used. Description of the Procedure: Protocol guidelines were followed. The patient was placed in position over the procedure table. The target area was identified and the area prepped in the usual manner. Skin & deeper tissues infiltrated with local anesthetic. Appropriate amount of time allowed to pass for local anesthetics to take effect. The procedure needle was advanced under fluoroscopic guidance into the sacroiliac joint until a firm endpoint was obtained. Proper needle placement secured. Negative aspiration confirmed. Solution injected in intermittent fashion, asking for systemic symptoms every 0.5cc of injectate. The needles were then removed and the area cleansed, making sure to leave some of the prepping solution back to take advantage  of its long term bactericidal properties. There were no vitals filed for this visit.  Start Time:   hrs. End Time:   hrs. Materials:  Needle(s) Type: Spinal Needle Gauge: 22G Length: 5.0-in Medication(s): Please see orders for medications and dosing details.  Imaging Guidance (Non-Spinal):          Type of Imaging  Technique: Fluoroscopy Guidance (Non-Spinal) Indication(s): Assistance in needle guidance and placement for procedures requiring needle placement in or near specific anatomical locations not easily accessible without such assistance. Exposure Time: Please see nurses notes. Contrast: Before injecting any contrast, we confirmed that the patient did not have an allergy to iodine, shellfish, or radiological contrast. Once satisfactory needle placement was completed at the desired level, radiological contrast was injected. Contrast injected under live fluoroscopy. No contrast complications. See chart for type and volume of contrast used. Fluoroscopic Guidance: I was personally present during the use of fluoroscopy. "Tunnel Vision Technique" used to obtain the best possible view of the target area. Parallax error corrected before commencing the procedure. "Direction-depth-direction" technique used to introduce the needle under continuous pulsed fluoroscopy. Once target was reached, antero-posterior, oblique, and lateral fluoroscopic projection used confirm needle placement in all planes. Images permanently stored in EMR. Interpretation: I personally interpreted the imaging intraoperatively. Adequate needle placement confirmed in multiple planes. Appropriate spread of contrast into desired area was observed. No evidence of afferent or efferent intravascular uptake. Permanent images saved into the patient's record.  Antibiotic Prophylaxis:   Anti-infectives (From admission, onward)    None      Indication(s): None identified  Post-operative Assessment:  Post-procedure Vital Signs:   Pulse/HCG Rate:    Temp:   Resp:   BP:   SpO2:    EBL: None  Complications: No immediate post-treatment complications observed by team, or reported by patient.  Note: The patient tolerated the entire procedure well. A repeat set of vitals were taken after the procedure and the patient was kept under observation following institutional policy, for this type of procedure. Post-procedural neurological assessment was performed, showing return to baseline, prior to discharge. The patient was provided with post-procedure discharge instructions, including a section on how to identify potential problems. Should any problems arise concerning this procedure, the patient was given instructions to immediately contact us, at any time, without hesitation. In any case, we plan to contact the patient by telephone for a follow-up status report regarding this interventional procedure.  Comments:  No additional relevant information.  Plan of Care  Orders:  No orders of the defined types were placed in this encounter.  Chronic Opioid Analgesic:  Oxycodone IR 5 mg, 1 tab PO QD (5 mg/day of oxycodone) PRN MME/day: 7.5 mg/day.   Medications ordered for procedure: No orders of the defined types were placed in this encounter.  Medications administered: Chrishana L. Woo had no medications administered during this visit.  See the medical record for exact dosing, route, and time of administration.  Follow-up plan:   No follow-ups on file.     Interventional Therapies  Risk  Complexity Considerations:   WNL   Planned  Pending:   Therapeutic right lumbar facet RFA #2 (02/01/2021)    Under consideration:   Diagnostic left L2 and L3 MBB (superior cluneal nerve) #1  Possible left superior cluneal nerve RFA    Completed:   Therapeutic right cervical ESI x1 (08/24/2020) Diagnostic bilateral GONB x1 (08/19/2019) Palliative right cervical facet MBB x3 (07/23/2018)  Palliative left cervical facet MBB x4  (04/01/2019)  Palliative right cervical facet RFA x1 (09/17/2018) Palliative left cervical facet RFA x1 (05/04/2019) Palliative right L2-3 interlaminar LESI x2  Palliative right L1 TFESI x1  Bilateral lumbar spinal cord stimulator trial (done) (05/14/2018) (by me)  Bilateral lumbar spinal cord stimulator implant (09/28/2019) (by me)  Bilateral spinal cord stimulator revision (03/14/2020) (by me)  Diagnostic bilateral lumbar  facet MBB x3  Diagnostic bilateral SI joint block x2  Palliative left lumbar facet RFA x2 (03/27/2021)  Therapeutic right lumbar facet RFA x2 (02/01/2021)    Therapeutic  Palliative (PRN) options:   Therapeutic right cervical ESI #2  Diagnostic bilateral GONB #2  Palliative bilateral cervical facet blocks #5  Palliative right cervical facet RFA #2  Palliative left cervical facet RFA #2  Palliative right L2-3 interlaminar LESI #3  Palliative right L1 TFESI #2  Diagnostic bilateral lumbar facet block #4  Diagnostic bilateral SI joint block #3  Palliative left lumbar facet RFA #2  Palliative right lumbar facet RFA #3      Recent Visits Date Type Provider Dept  08/13/21 Office Visit Milinda Pointer, MD [facility]  Showing recent visits within past 90 days and meeting all other requirements Future Appointments Date Type Provider Dept  08/28/21 Appointment Milinda Pointer, [facility]  11/05/21 Appointment Milinda Pointer, MD [facility]  Showing future appointments within next 90 days and meeting all other requirements  Disposition: Discharge home  Discharge (Date  Time): 08/28/2021;   hrs.   Primary Care Physician: Pleas Koch, NP Location: [facility] Note by: Gaspar Cola, MD Date: 08/28/2021; Time: 6:40 AM  Disclaimer:  Medicine is not an Chief Strategy Officer. The only guarantee in medicine is that nothing is guaranteed. It is important to note that the decision to proceed with this  intervention was based on the information collected from the patient. The Data and conclusions were drawn from the patient's questionnaire, the interview, and the physical examination. Because the information was provided in large part by the patient, it cannot be guaranteed that it has not been purposely or unconsciously manipulated. Every effort has been made to obtain as much relevant data as possible for this evaluation. It is important to note that the conclusions that lead to this procedure are derived in large part from the available data. Always take into account that the treatment will also be dependent on availability of resources and existing treatment guidelines, considered by other Pain Management Practitioners as being common knowledge and practice, at the time of the intervention. For Medico-Legal purposes, it is also important to point out that variation in procedural techniques and pharmacological choices are the acceptable norm. The indications, contraindications, technique, and results of the above procedure should only be interpreted and judged by a Board-Certified Interventional Pain Specialist with extensive familiarity and expertise in the same exact procedure and technique.

## 2021-08-28 ENCOUNTER — Ambulatory Visit: Payer: Medicare PPO | Admitting: Pain Medicine

## 2021-08-29 ENCOUNTER — Telehealth: Payer: Medicare PPO | Admitting: Primary Care

## 2021-08-29 ENCOUNTER — Other Ambulatory Visit: Payer: Self-pay

## 2021-08-31 ENCOUNTER — Encounter: Payer: Self-pay | Admitting: Primary Care

## 2021-08-31 ENCOUNTER — Telehealth (INDEPENDENT_AMBULATORY_CARE_PROVIDER_SITE_OTHER): Payer: Medicare PPO | Admitting: Primary Care

## 2021-08-31 ENCOUNTER — Other Ambulatory Visit: Payer: Self-pay

## 2021-08-31 VITALS — Ht 61.0 in | Wt 148.0 lb

## 2021-08-31 DIAGNOSIS — R140 Abdominal distension (gaseous): Secondary | ICD-10-CM

## 2021-08-31 DIAGNOSIS — K219 Gastro-esophageal reflux disease without esophagitis: Secondary | ICD-10-CM

## 2021-08-31 DIAGNOSIS — F3341 Major depressive disorder, recurrent, in partial remission: Secondary | ICD-10-CM | POA: Diagnosis not present

## 2021-08-31 DIAGNOSIS — K5909 Other constipation: Secondary | ICD-10-CM

## 2021-08-31 DIAGNOSIS — G4700 Insomnia, unspecified: Secondary | ICD-10-CM | POA: Diagnosis not present

## 2021-08-31 DIAGNOSIS — F411 Generalized anxiety disorder: Secondary | ICD-10-CM | POA: Diagnosis not present

## 2021-08-31 MED ORDER — OMEPRAZOLE 20 MG PO CPDR: 20.0000 mg | DELAYED_RELEASE_CAPSULE | Freq: Every day | ORAL | 0 refills | Status: DC

## 2021-08-31 MED ORDER — MIRTAZAPINE 15 MG PO TABS: 15.0000 mg | ORAL_TABLET | Freq: Every day | ORAL | 0 refills | Status: DC

## 2021-08-31 MED ORDER — TRAZODONE HCL 100 MG PO TABS: 100.0000 mg | ORAL_TABLET | Freq: Every evening | ORAL | 1 refills | Status: DC | PRN

## 2021-08-31 NOTE — Assessment & Plan Note (Signed)
Uncontrolled despite treatment.  Discussed that fluoxetine may not be the best treatment for GAD, she understands but would like to continue.   She has failed Buspar and Hydroxyzine. Gabapentin was not effective either when used for pain.  Trial of mirtazipine 15 mg HS for sleep and anxiety. She will update.  Consider propranolol.  Again she continues to decline therapy and psychiatry.

## 2021-08-31 NOTE — Assessment & Plan Note (Signed)
Uncontrolled. Suspect this is secondary to anxiety.  Discussed risk for serotonin syndrome.  Reduce Trazodone to 100-200 mg HS. Continue fluoxetine 60 mg. Start mirtazipine 15 mg HS for sleep.  She will update.

## 2021-08-31 NOTE — Assessment & Plan Note (Signed)
Karen Edwards could be contributing to gas and abdominal bloating, discussed this today.  Will check xray of abdomen.

## 2021-08-31 NOTE — Assessment & Plan Note (Signed)
Controlled per patient.  Continue fluoxetine 60 mg daily as she does not wish to discontinue.

## 2021-08-31 NOTE — Progress Notes (Signed)
Patient ID: Karen Edwards, female    DOB: November 15, 1965, 55 y.o.   MRN: [id number]  Virtual visit completed through [employer], a video enabled telemedicine application. Due to national recommendations of social distancing due to COVID-19, a virtual visit is felt to be most appropriate for this patient at this time. Reviewed limitations, risks, security and privacy concerns of performing a virtual visit and the availability of in person appointments. I also reviewed that there may be a patient responsible charge related to this service. The patient agreed to proceed.   Patient location: home Provider location: [place] [facility], office Persons participating in this virtual visit: patient, provider   If any vitals were documented, they were collected by patient at home unless specified below.    Ht 5\' 1"  (1.549 m)   Wt 148 lb (67.1 kg)   LMP 05/05/2017 (Exact Date)   BMI 27.96 kg/m    CC: Abdominal bloating/gas Subjective:   HPI: Karen Edwards is a 55 y.o. female presenting on 08/31/2021 to discuss abdominal bloating, belching, and gas. She would also like to discuss chronic anxiety.   1) Abdominal Bloating/Gas/Belching:   Chronic for years, progressed over the last few months. Anytime she drinks anything she is belching or passing gas. Anytime she eats anything, she passes gas. She constantly feels abdominal bloating. This began years ago.   She eats one meal a day.   She has a new boyfriend who noticed these symptoms and recommended she be evaluated.   She's been taking Cascara OTC for years for chronic constipation with improvement. She's tried numerous other things OTC for constipation and has not been successful. She is managed on oxycodone for which she tries to take sparingly.  She denies unexplained weight loss, abdominal pain, changes in bowels, rectal bleeding. Her last colonoscopy was in 2019, due again in 2024.   2) Chronic Anxiety: Chronic for years, managed  on fluoxetine 60 mg which has helped a lot with depression over the years, she does not wish to change. She is currently managed on Buspar 15 mg in AM and 30 HS, doesn't feel as though this is effective.   She is managed on gabapentin for chronic pain, is prescribed 200 mg TID, hasn't noticed improvement in pain or anxiety so she stopped taking. She is also managed on Trazodone 300 mg HS but will lay awake at night for hours.   She's under a lot of stress as she's taking care of her elderly mother who has mild dementia and her daughter and husband have separated.   She does not wish to see a therapist or psychiatry.        Relevant past medical, surgical, family and social history reviewed and updated as indicated. Interim medical history since our last visit reviewed. Allergies and medications reviewed and updated. Outpatient Medications Prior to Visit  Medication Sig Dispense Refill   acetaminophen (TYLENOL) 500 MG tablet Take 1,000 mg by mouth every 6 (six) hours as needed (for pain.).      diazepam (VALIUM) 10 MG tablet Take 1 tablet (10 mg total) by mouth 60 (sixty) minutes before procedure for 1 dose. Take with a sip of water, on an empty stomach. Do not eat anything for 6 hours prior to procedure. 1 tablet 0   FLUoxetine (PROZAC) 20 MG capsule Take 3 capsules (60 mg total) by mouth daily. For depression. 270 capsule 3   ondansetron (ZOFRAN-ODT) 4 MG disintegrating tablet Take 4 mg by mouth  every 4 (four) hours as needed (vertigo/dizziness).      oxyCODONE (OXY IR/ROXICODONE) 5 MG immediate release tablet Take 1 tablet (5 mg total) by mouth 2 (two) times daily as needed for severe pain. Must last 30 days. 40 tablet 0   [START ON 09/12/2021] oxyCODONE (OXY IR/ROXICODONE) 5 MG immediate release tablet Take 1 tablet (5 mg total) by mouth 2 (two) times daily as needed for severe pain. Must last 30 days. 40 tablet 0   [START ON 10/12/2021] oxyCODONE (OXY IR/ROXICODONE) 5 MG immediate release  tablet Take 1 tablet (5 mg total) by mouth 2 (two) times daily as needed for severe pain. Must last 30 days. 40 tablet 0   scopolamine (TRANSDERM-SCOP) 1 MG/3DAYS Place 1 patch onto the skin daily as needed (vertigo/dizziness).     tiZANidine (ZANAFLEX) 4 MG tablet TAKE 1 TABLET BY MOUTH EVERY 8 HOURS AS NEEDED FOR MUSCLE SPASM 84 tablet 0   busPIRone (BUSPAR) 15 MG tablet TAKE 1 TABLET BY MOUTH TWICE DAILY FOR ANXIETY 180 tablet 1   traZODone (DESYREL) 100 MG tablet TAKE 3 TABLETS BY MOUTH AT BEDTIME FOR SLEEP 270 tablet 1   gabapentin (NEURONTIN) 100 MG capsule Take 100 mg by mouth 3 (three) times daily. (Patient not taking: Reported on 08/31/2021)     Omega-3 Fatty Acids (FISH OIL) 1200 MG CAPS Take 1,200 mg by mouth daily. (Patient not taking: Reported on 08/31/2021)     hydrOXYzine (ATARAX/VISTARIL) 10 MG tablet Take 1 or 2 tablets by mouth one hour prior to dental procedure for anxiety. 10 tablet 0   No facility-administered medications prior to visit.     Per HPI unless specifically indicated in ROS section below Review of Systems  Gastrointestinal:  Positive for constipation. Negative for abdominal pain, blood in stool, diarrhea, nausea and vomiting.       Belching, abdominal bloating  Psychiatric/Behavioral:  Positive for sleep disturbance. The patient is nervous/anxious.   Objective:  Ht 5\' 1"  (1.549 m)   Wt 148 lb (67.1 kg)   LMP 05/05/2017 (Exact Date)   BMI 27.96 kg/m   Wt Readings from Last 3 Encounters:  08/31/21 148 lb (67.1 kg)  08/13/21 148 lb (67.1 kg)  05/10/21 143 lb (64.9 kg)       Physical exam: General: Alert and oriented x 3, no distress, does not appear sickly  Pulmonary: Speaks in complete sentences without increased work of breathing, no cough during visit.  Psychiatric: Normal mood, thought content, and behavior.     Results for orders placed or performed in visit on 04/16/21  ToxASSURE Select 93 (MW), Urine  Result Value Ref Range   Summary Note     Assessment & Plan:   Problem List Items Addressed This Visit       Digestive   Chronic constipation (Chronic)    Suspect Cascara could be contributing to gas and abdominal bloating, discussed this today.  Will check xray of abdomen.       GERD (gastroesophageal reflux disease) - Primary    Suspect either GERD or her Cascara to be causing belching, gas, bloating symptoms. Discussed this with patient today.  Trial of omeprazole 20 mg sent to pharmacy. She will update.   Will check abdominal plain films.      Relevant Medications   omeprazole (PRILOSEC) 20 MG capsule     Other   MDD (major depressive disorder)    Controlled per patient.  Continue fluoxetine 60 mg daily as she does not wish  to discontinue.       Relevant Medications   mirtazapine (REMERON) 15 MG tablet   traZODone (DESYREL) 100 MG tablet   Insomnia    Uncontrolled. Suspect this is secondary to anxiety.  Discussed risk for serotonin syndrome.  Reduce Trazodone to 100-200 mg HS. Continue fluoxetine 60 mg. Start mirtazipine 15 mg HS for sleep.  She will update.      Relevant Medications   mirtazapine (REMERON) 15 MG tablet   traZODone (DESYREL) 100 MG tablet   GAD (generalized anxiety disorder)    Uncontrolled despite treatment.  Discussed that fluoxetine may not be the best treatment for GAD, she understands but would like to continue.   She has failed Buspar and Hydroxyzine. Gabapentin was not effective either when used for pain.  Trial of mirtazipine 15 mg HS for sleep and anxiety. She will update.  Consider propranolol.  Again she continues to decline therapy and psychiatry.      Relevant Medications   mirtazapine (REMERON) 15 MG tablet   traZODone (DESYREL) 100 MG tablet   Other Visit Diagnoses     Abdominal bloating       Relevant Medications   omeprazole (PRILOSEC) 20 MG capsule   Other Relevant Orders   DG Abd 2 Views        Meds ordered this encounter  Medications    omeprazole (PRILOSEC) 20 MG capsule    Sig: Take 1 capsule (20 mg total) by mouth daily. For belching and gas.    Dispense:  90 capsule    Refill:  0    Order Specific Question:   Supervising Provider    Answer:   BEDSOLE, AMY E [[id number]]   mirtazapine (REMERON) 15 MG tablet    Sig: Take 1 tablet (15 mg total) by mouth at bedtime. For sleep.    Dispense:  30 tablet    Refill:  0    Order Specific Question:   Supervising Provider    Answer:   BEDSOLE, AMY E [[id number]]   traZODone (DESYREL) 100 MG tablet    Sig: Take 1-2 tablets (100-200 mg total) by mouth at bedtime as needed for sleep.    Dispense:  180 tablet    Refill:  1    Order Specific Question:   Supervising Provider    Answer:   Diona Browner, AMY E [[id number]]   Orders Placed This Encounter  Procedures   DG Abd 2 Views    55 year old female with chronic abdominal bloating, gas, constipation. Please evaluate.    Standing Status:   Future    Standing Expiration Date:   08/31/2022    Order Specific Question:   Reason for Exam (SYMPTOM  OR DIAGNOSIS REQUIRED)    Answer:   chronic abdominal bloating    Order Specific Question:   Is patient pregnant?    Answer:   No    Order Specific Question:   Preferred imaging location?    Answer:   [facility]    Order Specific Question:   Radiology Contrast Protocol - do NOT remove file path    Answer:   [email or web address]    I discussed the assessment and treatment plan with the patient. The patient was provided an opportunity to ask questions and all were answered. The patient agreed with the plan and demonstrated an understanding of the instructions. The patient was advised to call back or seek an in-person evaluation if the symptoms worsen or if the condition fails to improve as anticipated.  Follow up plan:  Reduce your trazodone to 100 or 200 mg at bedtime for sleep.  Try mirtazapine 15 mg at bedtime for sleep and anxiety.   Start  omeprazole 20 mg once daily for belching and gas.  Complete your xray as discussed.  Please update me in about one week.  It was a pleasure to see you today!    Pleas Koch, NP

## 2021-08-31 NOTE — Patient Instructions (Signed)
Reduce your trazodone to 100 or 200 mg at bedtime for sleep.  Try mirtazapine 15 mg at bedtime for sleep and anxiety.   Start omeprazole 20 mg once daily for belching and gas.  Complete your xray as discussed.  Please update me in about one week.  It was a pleasure to see you today!

## 2021-08-31 NOTE — Assessment & Plan Note (Signed)
Suspect either GERD or her Cascara to be causing belching, gas, bloating symptoms. Discussed this with patient today.  Trial of omeprazole 20 mg sent to pharmacy. She will update.   Will check abdominal plain films.

## 2021-09-06 ENCOUNTER — Other Ambulatory Visit: Payer: Self-pay | Admitting: Primary Care

## 2021-09-06 DIAGNOSIS — F32A Depression, unspecified: Secondary | ICD-10-CM

## 2021-09-07 ENCOUNTER — Ambulatory Visit
Admission: RE | Admit: 2021-09-07 | Discharge: 2021-09-07 | Disposition: A | Discharge status: Home / Self Care | Payer: Medicare PPO | Source: Ambulatory Visit | Attending: Primary Care | Admitting: Primary Care

## 2021-09-07 ENCOUNTER — Other Ambulatory Visit: Payer: Self-pay

## 2021-09-07 ENCOUNTER — Ambulatory Visit
Admission: RE | Admit: 2021-09-07 | Discharge: 2021-09-07 | Disposition: A | Discharge status: Home / Self Care | Payer: Medicare PPO | Attending: Primary Care | Admitting: Primary Care

## 2021-09-07 DIAGNOSIS — R140 Abdominal distension (gaseous): Secondary | ICD-10-CM | POA: Diagnosis not present

## 2021-09-07 DIAGNOSIS — K5900 Constipation, unspecified: Secondary | ICD-10-CM | POA: Diagnosis not present

## 2021-09-10 DIAGNOSIS — R140 Abdominal distension (gaseous): Secondary | ICD-10-CM

## 2021-09-10 DIAGNOSIS — K5909 Other constipation: Secondary | ICD-10-CM

## 2021-09-18 ENCOUNTER — Other Ambulatory Visit: Payer: Self-pay

## 2021-09-18 ENCOUNTER — Ambulatory Visit
Admission: RE | Admit: 2021-09-18 | Discharge: 2021-09-18 | Disposition: A | Discharge status: Home / Self Care | Payer: Medicare PPO | Source: Ambulatory Visit | Attending: Pain Medicine | Admitting: Pain Medicine

## 2021-09-18 ENCOUNTER — Encounter: Payer: Self-pay | Admitting: Pain Medicine

## 2021-09-18 ENCOUNTER — Ambulatory Visit (HOSPITAL_BASED_OUTPATIENT_CLINIC_OR_DEPARTMENT_OTHER): Payer: Medicare PPO | Admitting: Pain Medicine

## 2021-09-18 VITALS — BP 119/77 | HR 84 | Temp 97.2°F | Resp 12 | Ht 62.0 in | Wt 148.0 lb

## 2021-09-18 DIAGNOSIS — M25551 Pain in right hip: Secondary | ICD-10-CM

## 2021-09-18 DIAGNOSIS — M533 Sacrococcygeal disorders, not elsewhere classified: Secondary | ICD-10-CM

## 2021-09-18 DIAGNOSIS — M76891 Other specified enthesopathies of right lower limb, excluding foot: Secondary | ICD-10-CM | POA: Diagnosis not present

## 2021-09-18 DIAGNOSIS — M25552 Pain in left hip: Secondary | ICD-10-CM | POA: Insufficient documentation

## 2021-09-18 DIAGNOSIS — M545 Low back pain, unspecified: Secondary | ICD-10-CM

## 2021-09-18 DIAGNOSIS — M76892 Other specified enthesopathies of left lower limb, excluding foot: Secondary | ICD-10-CM

## 2021-09-18 DIAGNOSIS — G8929 Other chronic pain: Secondary | ICD-10-CM | POA: Insufficient documentation

## 2021-09-18 MED ORDER — IOHEXOL 180 MG/ML  SOLN
10.0000 mL | Freq: Once | INTRAMUSCULAR | Status: AC
  Administered 2021-09-18: 5 mL via INTRA_ARTICULAR

## 2021-09-18 MED ORDER — PENTAFLUOROPROP-TETRAFLUOROETH EX AERO
INHALATION_SPRAY | Freq: Once | CUTANEOUS | Status: DC
  Filled 2021-09-18: qty 116

## 2021-09-18 MED ORDER — ROPIVACAINE HCL 2 MG/ML IJ SOLN
INTRAMUSCULAR | Status: AC
  Filled 2021-09-18: qty 20

## 2021-09-18 MED ORDER — ROPIVACAINE HCL 2 MG/ML IJ SOLN
9.0000 mL | Freq: Once | INTRAMUSCULAR | Status: AC
  Administered 2021-09-18: 9 mL via INTRA_ARTICULAR

## 2021-09-18 MED ORDER — METHYLPREDNISOLONE ACETATE 80 MG/ML IJ SUSP
80.0000 mg | Freq: Once | INTRAMUSCULAR | Status: AC
  Administered 2021-09-18: 80 mg via INTRA_ARTICULAR
  Filled 2021-09-18: qty 1

## 2021-09-18 MED ORDER — LIDOCAINE HCL 2 % IJ SOLN
20.0000 mL | Freq: Once | INTRAMUSCULAR | Status: AC
  Administered 2021-09-18: 200 mg

## 2021-09-18 NOTE — Progress Notes (Signed)
PROVIDER NOTE: Information contained herein reflects review and annotations entered in association with encounter. Interpretation of such information and data should be left to medically-trained personnel. Information provided to patient can be located elsewhere in the medical record under "Patient Instructions". Document created using STT-dictation technology, any transcriptional errors that may result from process are unintentional.    Patient: Karen Edwards  Service Category: Procedure Provider: Gaspar Cola, MD DOB: 1966-05-18 DOS: 09/18/2021 Location: [facility] MRN: [id number] Setting: Ambulatory - outpatient Referring Provider: Pleas Koch, NP Type: Established Patient Specialty: Interventional Pain Management PCP: Pleas Koch, NP  Primary Reason for Visit: Interventional Pain Management Treatment. CC: Back Pain (Low, bilateral)    Procedure #1:  Anesthesia, Analgesia, Anxiolysis:  Type: Diagnostic Sacroiliac Joint Steroid Injection          Region: Superior Lumbosacral Region Level: PSIS (Posterior Superior Iliac Spine) Laterality: Bilateral  Anesthesia: Local (1-2% Lidocaine)  Anxiolysis: None  Sedation: None  Guidance: Fluoroscopy           Position: Prone           Indications: 1. Chronic sacroiliac joint pain (Bilateral) (L>R)   2. Chronic low back pain (Bilateral) w/o sciatica   3. Sacroiliac joint dysfunction (Bilateral)    Procedure #2:    Type: Intra-Articular Hip Injection & Trochanteric bursa injection  #1   Primary Purpose: Diagnostic Region: Posterolateral hip joint area. Level: Lower pelvic and hip joint level. Target Area: Superior aspect of the hip joint cavity, going thru the superior portion of the capsular ligament. Approach: Posterolateral approach. Laterality: Bilateral     Indications: 1. Chronic hip pain (3ry area of Pain) (Bilateral) (R>L)   2. Enthesopathy of hip region (Bilateral)    Pain  Score: Pre-procedure: 8 /10 Post-procedure: 6 /10     Pre-op H&P Assessment:  Karen Edwards is a 55 y.o. (year old), female patient, seen today for interventional treatment. She  has a past surgical history that includes Cesarean section; Ankle surgery (Right, 03/02/2014); Hemorrhoid surgery (N/A, 06/16/2015); Cervical spine surgery (02/21/2016); Cesarean section with bilateral tubal ligation (1990); Colonoscopy (04/19/2015); Hysteroscopy with D & C (12/24/2013); Colonoscopy with propofol (N/A, 05/18/2018); Esophagogastroduodenoscopy (egd) with propofol (N/A, 05/18/2018); Spinal cord stimulator insertion (N/A, 06/18/2018); Tubal ligation; and Lumbar spinal cord simulator revision (N/A, 03/14/2020). Karen Edwards has a current medication list which includes the following prescription(s): acetaminophen, diazepam, fluoxetine, omeprazole, ondansetron, oxycodone, [START ON 10/12/2021] oxycodone, scopolamine, tizanidine, trazodone, gabapentin, fish oil, and oxycodone, and the following Facility-Administered Medications: pentafluoroprop-tetrafluoroeth. Her primarily concern today is the Back Pain (Low, bilateral)  Initial Vital Signs:  Pulse/HCG Rate: 84ECG Heart Rate: 73 Temp: (!) 97.2 F (36.2 C) Resp: 16 BP: 132/74 SpO2: 99 %  BMI: Estimated body mass index is 27.07 kg/m as calculated from the following:   Height as of this encounter: 5\' 2"  (1.575 m).   Weight as of this encounter: 148 lb (67.1 kg).  Risk Assessment: Allergies: Reviewed. She is allergic to gabapentin.  Allergy Precautions: None required Coagulopathies: Reviewed. None identified.  Blood-thinner therapy: None at this time Active Infection(s): Reviewed. None identified. Karen Edwards is afebrile  Site Confirmation: Karen Edwards was asked to confirm the procedure and laterality before marking the site Procedure checklist: Completed Consent: Before the procedure and under the influence of no sedative(s), amnesic(s), or anxiolytics, the  patient was informed of the treatment options, risks and possible complications. To fulfill our ethical and legal obligations, as recommended by the [employer]'s  Code of Ethics, I have informed the patient of my clinical impression; the nature and purpose of the treatment or procedure; the risks, benefits, and possible complications of the intervention; the alternatives, including doing nothing; the risk(s) and benefit(s) of the alternative treatment(s) or procedure(s); and the risk(s) and benefit(s) of doing nothing. The patient was provided information about the general risks and possible complications associated with the procedure. These may include, but are not limited to: failure to achieve desired goals, infection, bleeding, organ or nerve damage, allergic reactions, paralysis, and death. In addition, the patient was informed of those risks and complications associated to the procedure, such as failure to decrease pain; infection; bleeding; organ or nerve damage with subsequent damage to sensory, motor, and/or autonomic systems, resulting in permanent pain, numbness, and/or weakness of one or several areas of the body; allergic reactions; (i.e.: anaphylactic reaction); and/or death. Furthermore, the patient was informed of those risks and complications associated with the medications. These include, but are not limited to: allergic reactions (i.e.: anaphylactic or anaphylactoid reaction(s)); adrenal axis suppression; blood sugar elevation that in diabetics may result in ketoacidosis or comma; water retention that in patients with history of congestive heart failure may result in shortness of breath, pulmonary edema, and decompensation with resultant heart failure; weight gain; swelling or edema; medication-induced neural toxicity; particulate matter embolism and blood vessel occlusion with resultant organ, and/or nervous system infarction; and/or aseptic necrosis of one or more  joints. Finally, the patient was informed that Medicine is not an exact science; therefore, there is also the possibility of unforeseen or unpredictable risks and/or possible complications that may result in a catastrophic outcome. The patient indicated having understood very clearly. We have given the patient no guarantees and we have made no promises. Enough time was given to the patient to ask questions, all of which were answered to the patient's satisfaction. Karen Edwards has indicated that she wanted to continue with the procedure. Attestation: I, the ordering provider, attest that I have discussed with the patient the benefits, risks, side-effects, alternatives, likelihood of achieving goals, and potential problems during recovery for the procedure that I have provided informed consent. Date  Time: 09/18/2021  9:43 AM  Pre-Procedure Preparation:  Monitoring: As per clinic protocol. Respiration, ETCO2, SpO2, BP, heart rate and rhythm monitor placed and checked for adequate function Safety Precautions: Patient was assessed for positional comfort and pressure points before starting the procedure. Time-out: I initiated and conducted the "Time-out" before starting the procedure, as per protocol. The patient was asked to participate by confirming the accuracy of the "Time Out" information. Verification of the correct person, site, and procedure were performed and confirmed by me, the nursing staff, and the patient. "Time-out" conducted as per Joint Commission's Universal Protocol (UP.01.01.01). Time: 1050  Description of Procedure #1:  Target Area: Superior, posterior, aspect of the sacroiliac fissure Approach: Posterior, paraspinal, ipsilateral approach. Area Prepped: Entire Lower Lumbosacral Region DuraPrep (Iodine Povacrylex [0.7% available iodine] and Isopropyl Alcohol, 74% w/w) Safety Precautions: Aspiration looking for blood return was conducted prior to all injections. At no point did we  inject any substances, as a needle was being advanced. No attempts were made at seeking any paresthesias. Safe injection practices and needle disposal techniques used. Medications properly checked for expiration dates. SDV (single dose vial) medications used. Description of the Procedure: Protocol guidelines were followed. The patient was placed in position over the procedure table. The target area was identified and the area prepped in the usual manner.  Skin & deeper tissues infiltrated with local anesthetic. Appropriate amount of time allowed to pass for local anesthetics to take effect. The procedure needle was advanced under fluoroscopic guidance into the sacroiliac joint until a firm endpoint was obtained. Proper needle placement secured. Negative aspiration confirmed. Solution injected in intermittent fashion, asking for systemic symptoms every 0.5cc of injectate. The needles were then removed and the area cleansed, making sure to leave some of the prepping solution back to take advantage of its long term bactericidal properties  Vitals:   09/18/21 1056 09/18/21 1101 09/18/21 1106 09/18/21 1111  BP: 128/80 (!) 113/99 122/80 119/77  Pulse:      Resp: (!) 21 13 18 12   Temp:      SpO2: 98% 97% 98% 97%  Weight:      Height:        Start Time: 1050 hrs. Materials:  Needle(s) Type: Spinal Needle Gauge: 22G Length: 5.0-in Medication(s): Please see orders for medications and dosing details.  Imaging Guidance (Non-Spinal) for procedure #1:  Type of Imaging Technique: Fluoroscopy Guidance (Non-Spinal) Indication(s): Assistance in needle guidance and placement for procedures requiring needle placement in or near specific anatomical locations not easily accessible without such assistance. Exposure Time: Please see nurses notes. Contrast: Before injecting any contrast, we confirmed that the patient did not have an allergy to iodine, shellfish, or radiological contrast. Once satisfactory needle  placement was completed at the desired level, radiological contrast was injected. Contrast injected under live fluoroscopy. No contrast complications. See chart for type and volume of contrast used. Fluoroscopic Guidance: I was personally present during the use of fluoroscopy. "Tunnel Vision Technique" used to obtain the best possible view of the target area. Parallax error corrected before commencing the procedure. "Direction-depth-direction" technique used to introduce the needle under continuous pulsed fluoroscopy. Once target was reached, antero-posterior, oblique, and lateral fluoroscopic projection used confirm needle placement in all planes. Images permanently stored in EMR. Interpretation: I personally interpreted the imaging intraoperatively. Adequate needle placement confirmed in multiple planes. Appropriate spread of contrast into desired area was observed. No evidence of afferent or efferent intravascular uptake. Permanent images saved into the patient's record.  Description of Procedure #2:  Safety Precautions: Aspiration looking for blood return was conducted prior to all injections. At no point did we inject any substances, as a needle was being advanced. No attempts were made at seeking any paresthesias. Safe injection practices and needle disposal techniques used. Medications properly checked for expiration dates. SDV (single dose vial) medications used. Description of the Procedure: Protocol guidelines were followed. The patient was placed in position over the fluoroscopy table. The target area was identified and the area prepped in the usual manner. Skin & deeper tissues infiltrated with local anesthetic. Appropriate amount of time allowed to pass for local anesthetics to take effect. The procedure needles were then advanced to the target area. Proper needle placement secured. Negative aspiration confirmed. Solution injected in intermittent fashion, asking for systemic symptoms every 0.5cc  of injectate. The needles were then removed and the area cleansed, making sure to leave some of the prepping solution back to take advantage of its long term bactericidal properties. There were no vitals filed for this visit.  Start Time:   hrs. End Time:   hrs. Materials:  Needle(s) Type: Spinal Needle Gauge: 22G Length: 7.0-in Medication(s): Please see orders for medications and dosing details.  Imaging Guidance (Non-Spinal) for procedure #2:  Type of Imaging Technique: Fluoroscopy Guidance (Non-Spinal) Indication(s): Assistance in needle guidance  and placement for procedures requiring needle placement in or near specific anatomical locations not easily accessible without such assistance. Exposure Time: Please see nurses notes. Contrast: Before injecting any contrast, we confirmed that the patient did not have an allergy to iodine, shellfish, or radiological contrast. Once satisfactory needle placement was completed at the desired level, radiological contrast was injected. Contrast injected under live fluoroscopy. No contrast complications. See chart for type and volume of contrast used. Fluoroscopic Guidance: I was personally present during the use of fluoroscopy. "Tunnel Vision Technique" used to obtain the best possible view of the target area. Parallax error corrected before commencing the procedure. "Direction-depth-direction" technique used to introduce the needle under continuous pulsed fluoroscopy. Once target was reached, antero-posterior, oblique, and lateral fluoroscopic projection used confirm needle placement in all planes. Images permanently stored in EMR. Interpretation: I personally interpreted the imaging intraoperatively. Adequate needle placement confirmed in multiple planes. Appropriate spread of contrast into desired area was observed. No evidence of afferent or efferent intravascular uptake. Permanent images saved into the patient's record.  Antibiotic Prophylaxis:    Anti-infectives (From admission, onward)    None      Indication(s): None identified  Post-operative Assessment:  Post-procedure Vital Signs:  Pulse/HCG Rate: 8471 Temp: (!) 97.2 F (36.2 C) Resp: 12 BP: 119/77 SpO2: 97 %  EBL: None  Complications: No immediate post-treatment complications observed by team, or reported by patient.  Note: The patient tolerated the entire procedure well. A repeat set of vitals were taken after the procedure and the patient was kept under observation following institutional policy, for this type of procedure. Post-procedural neurological assessment was performed, showing return to baseline, prior to discharge. The patient was provided with post-procedure discharge instructions, including a section on how to identify potential problems. Should any problems arise concerning this procedure, the patient was given instructions to immediately contact us, at any time, without hesitation. In any case, we plan to contact the patient by telephone for a follow-up status report regarding this interventional procedure.  Comments:  No additional relevant information.  Plan of Care  Orders:  Orders Placed This Encounter  Procedures   SACROILIAC JOINT INJECTION    Scheduling Instructions:     Side: Bilateral     Sedation: Patient's choice.     Timeframe: Today    Order Specific Question:   Where will this procedure be performed?    Answer:   [facility]   HIP INJECTION    Scheduling Instructions:     Side: Bilateral     Sedation: Patient's choice.     Timeframe: Today   [facility] C-ARM 1-60 MIN NO REPORT    Intraoperative interpretation by procedural physician at [place].    Standing Status:   Standing    Number of Occurrences:   1    Order Specific Question:   Reason for exam:    Answer:   Assistance in needle guidance and placement for procedures requiring needle placement in or near specific anatomical locations not  easily accessible without such assistance.   Informed Consent Details: Physician/Practitioner Attestation; Transcribe to consent form and obtain patient signature    Nursing Order: Transcribe to consent form and obtain patient signature. Note: Always confirm laterality of pain with Ms. Tye Savoy, before procedure.    Order Specific Question:   Physician/Practitioner attestation of informed consent for procedure/surgical case    Answer:   I, the physician/practitioner, attest that I have discussed with the patient the benefits,  risks, side effects, alternatives, likelihood of achieving goals and potential problems during recovery for the procedure that I have provided informed consent.    Order Specific Question:   Procedure    Answer:   Sacroiliac Joint Block    Order Specific Question:   Physician/Practitioner performing the procedure    Answer:   Jamiesha Victoria A. Dossie Arbour, MD    Order Specific Question:   Indication/Reason    Answer:   Chronic Low Back and Hip Pain secondary to Sacroiliac Joint Pain (Arthralgia/Arthropathy)   Provide equipment / supplies at bedside    "Block Tray" (Disposable  single use) Needle type: SpinalSpinal Amount/quantity: 2 Size: Long (7-inch) Gauge: 22G    Standing Status:   Standing    Number of Occurrences:   1    Order Specific Question:   Specify    Answer:   Block Tray   Informed Consent Details: Physician/Practitioner Attestation; Transcribe to consent form and obtain patient signature    Nursing Order: Transcribe to consent form and obtain patient signature. Note: Always confirm laterality of pain with Ms. Tye Savoy, before procedure.    Order Specific Question:   Physician/Practitioner attestation of informed consent for procedure/surgical case    Answer:   I, the physician/practitioner, attest that I have discussed with the patient the benefits, risks, side effects, alternatives, likelihood of achieving goals and potential problems during recovery for the  procedure that I have provided informed consent.    Order Specific Question:   Procedure    Answer:   Hip injection    Order Specific Question:   Physician/Practitioner performing the procedure    Answer:   Jayma Volpi A. Dossie Arbour, MD    Order Specific Question:   Indication/Reason    Answer:   Hip Joint Pain (Arthralgia)    Chronic Opioid Analgesic:  Oxycodone IR 5 mg, 1 tab PO QD (5 mg/day of oxycodone) PRN MME/day: 7.5 mg/day.   Medications ordered for procedure: Meds ordered this encounter  Medications   lidocaine (XYLOCAINE) 2 % (with pres) injection 400 mg   pentafluoroprop-tetrafluoroeth (GEBAUERS) aerosol   methylPREDNISolone acetate (DEPO-MEDROL) injection 80 mg   ropivacaine (PF) 2 mg/mL (0.2%) (NAROPIN) injection 9 mL   iohexol (OMNIPAQUE) 180 MG/ML injection 10 mL    Must be Myelogram-compatible. If not available, you may substitute with a water-soluble, non-ionic, hypoallergenic, myelogram-compatible radiological contrast medium.   ropivacaine (PF) 2 mg/mL (0.2%) (NAROPIN) injection 9 mL   methylPREDNISolone acetate (DEPO-MEDROL) injection 80 mg    Medications administered: We administered lidocaine, methylPREDNISolone acetate, ropivacaine (PF) 2 mg/mL (0.2%), iohexol, ropivacaine (PF) 2 mg/mL (0.2%), and methylPREDNISolone acetate.  See the medical record for exact dosing, route, and time of administration.  Follow-up plan:   Return in about 2 weeks (around 10/02/2021) for Proc-day (T,Th), (VV), (PPE).       Interventional Therapies  Risk  Complexity Considerations:   Estimated body mass index is 27.07 kg/m as calculated from the following:   Height as of this encounter: 5\' 2"  (1.575 m).   Weight as of this encounter: 148 lb (67.1 kg). WNL   Planned  Pending:      Under consideration:      Completed:   Therapeutic right cervical ESI x1 (08/24/2020) (100/100/50/50)  Diagnostic bilateral GONB x1 (08/19/2019) Palliative right cervical facet MBB x3  (07/23/2018)  Palliative left cervical facet MBB x4 (04/01/2019)  Palliative right cervical facet RFA x1 (09/17/2018) Palliative left cervical facet RFA x1 (05/04/2019) Palliative right L2-3 interlaminar LESI x2  Therapeutic  left L1-2 LESI x1 (05/10/2021) (100/100/1 100 x 2 days/0)  Diagnostic/therapeutic left superior cluneal NB (L2, L3 dorsal rami) x1 (04/24/2021) (0/0/0/0)  Palliative right L1 TFESI x1  Bilateral lumbar spinal cord stimulator trial (done) (05/14/2018) (by me)  Bilateral lumbar spinal cord stimulator implant (09/28/2019) (by me)  Bilateral spinal cord stimulator revision (03/14/2020) (by me)  Diagnostic bilateral lumbar facet MBB x3  Diagnostic bilateral SI joint block x2  Palliative left lumbar facet RFA x2 (03/27/2021)  Therapeutic right lumbar facet RFA x2 (02/01/2021) (100/100/0/0)    Therapeutic  Palliative (PRN) options:   Therapeutic right cervical ESI #2  Diagnostic bilateral GONB #2  Palliative bilateral cervical facet blocks #5  Palliative right cervical facet RFA #2  Palliative left cervical facet RFA #2  Palliative right L2-3 interlaminar LESI #3  Palliative right L1 TFESI #2  Diagnostic bilateral lumbar facet block #4  Diagnostic bilateral SI joint block #3     Recent Visits Date Type Provider Dept  08/13/21 Office Visit Milinda Pointer, MD [facility]  Showing recent visits within past 90 days and meeting all other requirements Today's Visits Date Type Provider Dept  09/18/21 Procedure visit Milinda Pointer, MD [facility]  Showing today's visits and meeting all other requirements Future Appointments Date Type Provider Dept  10/04/21 Appointment Milinda Pointer, MD [facility]  11/05/21 Appointment Milinda Pointer, MD [facility]  Showing future appointments within next 90 days and meeting all other requirements Disposition: Discharge home  Discharge (Date  Time): 09/18/2021; 1122 hrs.   Primary Care  Physician: Pleas Koch, NP Location: [facility] Note by: Gaspar Cola, MD Date: 09/18/2021; Time: 12:29 PM  Disclaimer:  Medicine is not an Chief Strategy Officer. The only guarantee in medicine is that nothing is guaranteed. It is important to note that the decision to proceed with this intervention was based on the information collected from the patient. The Data and conclusions were drawn from the patient's questionnaire, the interview, and the physical examination. Because the information was provided in large part by the patient, it cannot be guaranteed that it has not been purposely or unconsciously manipulated. Every effort has been made to obtain as much relevant data as possible for this evaluation. It is important to note that the conclusions that lead to this procedure are derived in large part from the available data. Always take into account that the treatment will also be dependent on availability of resources and existing treatment guidelines, considered by other Pain Management Practitioners as being common knowledge and practice, at the time of the intervention. For Medico-Legal purposes, it is also important to point out that variation in procedural techniques and pharmacological choices are the acceptable norm. The indications, contraindications, technique, and results of the above procedure should only be interpreted and judged by a Board-Certified Interventional Pain Specialist with extensive familiarity and expertise in the same exact procedure and technique.

## 2021-09-18 NOTE — Progress Notes (Signed)
Safety precautions to be maintained throughout the outpatient stay will include: orient to surroundings, keep bed in low position, maintain call bell within reach at all times, provide assistance with transfer out of bed and ambulation.  

## 2021-09-19 ENCOUNTER — Telehealth: Payer: Self-pay

## 2021-09-19 NOTE — Telephone Encounter (Signed)
Called PP. No answer,  left message to call if needed. 

## 2021-09-25 ENCOUNTER — Telehealth: Payer: Self-pay

## 2021-09-25 NOTE — Telephone Encounter (Signed)
Made an appointment to be seen has some gi issues going on before being scheduled for colonoscopy dec 8 at 1:30

## 2021-10-04 ENCOUNTER — Ambulatory Visit: Payer: Medicare PPO | Attending: Pain Medicine | Admitting: Pain Medicine

## 2021-10-04 ENCOUNTER — Ambulatory Visit (INDEPENDENT_AMBULATORY_CARE_PROVIDER_SITE_OTHER): Payer: Medicare PPO | Admitting: Gastroenterology

## 2021-10-04 ENCOUNTER — Encounter: Payer: Self-pay | Admitting: Gastroenterology

## 2021-10-04 ENCOUNTER — Other Ambulatory Visit: Payer: Self-pay

## 2021-10-04 VITALS — BP 122/80 | HR 74 | Temp 98.5°F | Ht 61.0 in | Wt 155.2 lb

## 2021-10-04 DIAGNOSIS — K5904 Chronic idiopathic constipation: Secondary | ICD-10-CM

## 2021-10-04 DIAGNOSIS — M25551 Pain in right hip: Secondary | ICD-10-CM

## 2021-10-04 DIAGNOSIS — M533 Sacrococcygeal disorders, not elsewhere classified: Secondary | ICD-10-CM | POA: Diagnosis not present

## 2021-10-04 DIAGNOSIS — M25552 Pain in left hip: Secondary | ICD-10-CM | POA: Diagnosis not present

## 2021-10-04 DIAGNOSIS — M545 Low back pain, unspecified: Secondary | ICD-10-CM

## 2021-10-04 DIAGNOSIS — G8929 Other chronic pain: Secondary | ICD-10-CM

## 2021-10-04 DIAGNOSIS — M792 Neuralgia and neuritis, unspecified: Secondary | ICD-10-CM

## 2021-10-04 MED ORDER — LINACLOTIDE 290 MCG PO CAPS: 290.0000 ug | ORAL_CAPSULE | Freq: Every day | ORAL | 2 refills | Status: DC

## 2021-10-04 MED ORDER — PREGABALIN 25 MG PO CAPS: ORAL_CAPSULE | ORAL | 0 refills | Status: DC

## 2021-10-04 NOTE — Patient Instructions (Signed)
High-Fiber Eating Plan °Fiber, also called dietary fiber, is a type of carbohydrate. It is found foods such as fruits, vegetables, whole grains, and beans. A high-fiber diet can have many health benefits. Your health care provider may recommend a high-fiber diet to help: °Prevent constipation. Fiber can make your bowel movements more regular. °Lower your cholesterol. °Relieve the following conditions: °Inflammation of veins in the anus (hemorrhoids). °Inflammation of specific areas of the digestive tract (uncomplicated diverticulosis). °A problem of the large intestine, also called the colon, that sometimes causes pain and diarrhea (irritable bowel syndrome, or IBS). °Prevent overeating as part of a weight-loss plan. °Prevent heart disease, type 2 diabetes, and certain cancers. °What are tips for following this plan? °Reading food labels ° °Check the nutrition facts label on food products for the amount of dietary fiber. Choose foods that have 5 grams of fiber or more per serving. °The goals for recommended daily fiber intake include: °Men (age 50 or younger): 34-38 g. °Men (over age 50): 28-34 g. °Women (age 50 or younger): 25-28 g. °Women (over age 50): 22-25 g. °Your daily fiber goal is _____________ g. °Shopping °Choose whole fruits and vegetables instead of processed forms, such as apple juice or applesauce. °Choose a wide variety of high-fiber foods such as avocados, lentils, oats, and kidney beans. °Read the nutrition facts label of the foods you choose. Be aware of foods with added fiber. These foods often have high sugar and sodium amounts per serving. °Cooking °Use whole-grain flour for baking and cooking. °Cook with brown rice instead of white rice. °Meal planning °Start the day with a breakfast that is high in fiber, such as a cereal that contains 5 g of fiber or more per serving. °Eat breads and cereals that are made with whole-grain flour instead of refined flour or white flour. °Eat brown rice, bulgur  wheat, or millet instead of white rice. °Use beans in place of meat in soups, salads, and pasta dishes. °Be sure that half of the grains you eat each day are whole grains. °General information °You can get the recommended daily intake of dietary fiber by: °Eating a variety of fruits, vegetables, grains, nuts, and beans. °Taking a fiber supplement if you are not able to take in enough fiber in your diet. It is better to get fiber through food than from a supplement. °Gradually increase how much fiber you consume. If you increase your intake of dietary fiber too quickly, you may have bloating, cramping, or gas. °Drink plenty of water to help you digest fiber. °Choose high-fiber snacks, such as berries, raw vegetables, nuts, and popcorn. °What foods should I eat? °Fruits °Berries. Pears. Apples. Oranges. Avocado. Prunes and raisins. Dried figs. °Vegetables °Sweet potatoes. Spinach. Kale. Artichokes. Cabbage. Broccoli. Cauliflower. Green peas. Carrots. Squash. °Grains °Whole-grain breads. Multigrain cereal. Oats and oatmeal. Brown rice. Barley. Bulgur wheat. Millet. Quinoa. Bran muffins. Popcorn. Rye wafer crackers. °Meats and other proteins °Navy beans, kidney beans, and pinto beans. Soybeans. Split peas. Lentils. Nuts and seeds. °Dairy °Fiber-fortified yogurt. °Beverages °Fiber-fortified soy milk. Fiber-fortified orange juice. °Other foods °Fiber bars. °The items listed above may not be a complete list of recommended foods and beverages. Contact a dietitian for more information. °What foods should I avoid? °Fruits °Fruit juice. Cooked, strained fruit. °Vegetables °Fried potatoes. Canned vegetables. Well-cooked vegetables. °Grains °White bread. Pasta made with refined flour. White rice. °Meats and other proteins °Fatty cuts of meat. Fried chicken or fried fish. °Dairy °Milk. Yogurt. Cream cheese. Sour cream. °Fats and   oils °Butters. °Beverages °Soft drinks. °Other foods °Cakes and pastries. °The items listed above may  not be a complete list of foods and beverages to avoid. Talk with your dietitian about what choices are best for you. °Summary °Fiber is a type of carbohydrate. It is found in foods such as fruits, vegetables, whole grains, and beans. °A high-fiber diet has many benefits. It can help to prevent constipation, lower blood cholesterol, aid weight loss, and reduce your risk of heart disease, diabetes, and certain cancers. °Increase your intake of fiber gradually. Increasing fiber too quickly may cause cramping, bloating, and gas. Drink plenty of water while you increase the amount of fiber you consume. °The best sources of fiber include whole fruits and vegetables, whole grains, nuts, seeds, and beans. °This information is not intended to replace advice given to you by your health care provider. Make sure you discuss any questions you have with your health care provider. °Document Revised: 02/17/2020 Document Reviewed: 02/17/2020 °Elsevier Patient Education © 2022 [employer] ° °

## 2021-10-04 NOTE — Progress Notes (Signed)
Patient: Karen Edwards  Service Category: E/M  Provider: Gaspar Cola, MD  DOB: [birth year]  DOS: 10/04/2021  Location: Office  MRN: [id number]  Setting: Ambulatory outpatient  Referring Provider: Pleas Koch, NP  Type: Established Patient  Specialty: Interventional Pain Management  PCP: Pleas Koch, NP  Location: Remote location  Delivery: TeleHealth     Virtual Encounter - Pain Management PROVIDER NOTE: Information contained herein reflects review and annotations entered in association with encounter. Interpretation of such information and data should be left to medically-trained personnel. Information provided to patient can be located elsewhere in the medical record under "Patient Instructions". Document created using STT-dictation technology, any transcriptional errors that may result from process are unintentional.    Contact & Pharmacy Preferred: [phone] Home: [phone] (home) Mobile: [phone] (mobile) E-mail: [email or web address]  [facility] [address], [place] [address] Phone: [phone] Fax: [phone]   Pre-screening  Karen Edwards offered "in-person" vs "virtual" encounter. She indicated preferring virtual for this encounter.   Reason COVID-19*  Social distancing based on CDC and AMA recommendations.   I contacted Karen Edwards on 10/04/2021 via telephone.      I clearly identified myself as Gaspar Cola, MD. I verified that I was speaking with the correct person using two identifiers (Name: Karen Edwards, and date of birth: Karen Edwards).  Consent I sought verbal advanced consent from Karen Edwards for virtual visit interactions. I informed Karen Edwards of possible security and privacy concerns, risks, and limitations associated with providing "not-in-person" medical evaluation and management services. I also informed Karen Edwards of the availability of "in-person"  appointments. Finally, I informed her that there would be a charge for the virtual visit and that she could be  personally, fully or partially, financially responsible for it. Karen Edwards expressed understanding and agreed to proceed.   Historic Elements   Karen Edwards is a 55 y.o. year old, female patient evaluated today after our last contact on 09/18/2021. Karen Edwards  has a past medical history of Anxiety, Chronic back pain, Degenerative joint disease, Depression, Headache, Hypotension, IBS (irritable bowel syndrome) (05/2015), and MVP (mitral valve prolapse). She also  has a past surgical history that includes Cesarean section; Ankle surgery (Right, 03/02/2014); Hemorrhoid surgery (N/A, 06/16/2015); Cervical spine surgery (02/21/2016); Cesarean section with bilateral tubal ligation (1990); Colonoscopy (04/19/2015); Hysteroscopy with D & C (12/24/2013); Colonoscopy with propofol (N/A, 05/18/2018); Esophagogastroduodenoscopy (egd) with propofol (N/A, 05/18/2018); Spinal cord stimulator insertion (N/A, 06/18/2018); Tubal ligation; and Lumbar spinal cord simulator revision (N/A, 03/14/2020). Karen Edwards has a current medication list which includes the following prescription(s): acetaminophen, fluoxetine, omeprazole, ondansetron, oxycodone, [START ON 10/12/2021] oxycodone, pregabalin, scopolamine, tizanidine, trazodone, and oxycodone. She  reports that she has been smoking cigarettes. She has a 18.50 pack-year smoking history. She has never used smokeless tobacco. She reports current alcohol use of about 1.0 standard drink per week. She reports that she does not use drugs. Karen Edwards is allergic to gabapentin.   HPI  Today, she is being contacted for a post-procedure assessment.  The patient indicates having done really good with the injection where she had 100% relief of the pain for the duration of the local anesthetic and this seems to have continued except for some intermittent episodes of  electrical-like sensations in both hip areas.  The patient was taking gabapentin for this, but then it was discontinued due to side effects.  She indicates  that it was causing her to be really nervous and tremble.  Today I asked her if she had tried to hit me Lyrica and she indicates that she has not tried that.  We will go ahead and do a trial of the Lyrica starting with 25 mg p.o. at bedtime and will increase it as tolerated.  Plan: Pregabalin (Lyrica) trial.  Post-Procedure Evaluation  Procedure(s):    Procedure #1:  Anesthesia, Analgesia, Anxiolysis:  Type: Diagnostic Sacroiliac Joint Steroid Injection          Region: Superior Lumbosacral Region Level: PSIS (Posterior Superior Iliac Spine) Laterality: Bilateral  Anesthesia: Local (1-2% Lidocaine)  Anxiolysis: None  Sedation: None  Guidance: Fluoroscopy           Position: Prone           Indications: 1. Chronic sacroiliac joint pain (Bilateral) (L>R)   2. Chronic low back pain (Bilateral) w/o sciatica   3. Sacroiliac joint dysfunction (Bilateral)    Procedure #2:    Type: Intra-Articular Hip Injection & Trochanteric bursa injection  #1   Primary Purpose: Diagnostic Region: Posterolateral hip joint area. Level: Lower pelvic and hip joint level. Target Area: Superior aspect of the hip joint cavity, going thru the superior portion of the capsular ligament. Approach: Posterolateral approach. Laterality: Bilateral     Indications: 1. Chronic hip pain (3ry area of Pain) (Bilateral) (R>L)   2. Enthesopathy of hip region (Bilateral)    Pain Score: Pre-procedure: 8 /10 Post-procedure: 6 /10     Anxiolysis: Please see nurses note.  Effectiveness during initial hour after procedure (Ultra-Short Term Relief): 100 %.  Local anesthetic used: Long-acting (4-6 hours) Effectiveness: Defined as any analgesic benefit obtained secondary to the administration of local anesthetics. This carries significant diagnostic value as to the  etiological location, or anatomical origin, of the pain. Duration of benefit is expected to coincide with the duration of the local anesthetic used.  Effectiveness during initial 4-6 hours after procedure (Short-Term Relief): 100 %.  Long-term benefit: Defined as any relief past the pharmacologic duration of the local anesthetics.  Effectiveness past the initial 6 hours after procedure (Long-Term Relief): 100 % (continues to have intermittent sharp, stabbing pain in both hips.  no longer achey).  Benefits, current: Defined as benefit present at the time of this evaluation.   Analgesia: The patient indicates significant ongoing relief of the pain except for occasional intermittent sharp, stabbing, electrical-like pains in both hips.  The achiness that she was having is completely gone and therefore and that regard she has obtained 100% relief of that pain. Function: Ms. Witherington reports improvement in function ROM: Ms. Wegener reports improvement in ROM  Pharmacotherapy Assessment   Analgesic: Oxycodone IR 5 mg, 1 tab PO QD (5 mg/day of oxycodone) PRN MME/day: 7.5 mg/day.   Monitoring: [place] PMP: PDMP reviewed during this encounter.       Pharmacotherapy: No side-effects or adverse reactions reported. Compliance: No problems identified. Effectiveness: Clinically acceptable. Plan: Refer to "POC". UDS:  Summary  Date Value Ref Range Status  04/16/2021 Note  Final    Comment:    ==================================================================== [employer] Select 13 (MW) ==================================================================== Test                             Result       Flag       Units  Drug Absent but Declared for Prescription Verification   Oxycodone  Not Detected UNEXPECTED ng/mg creat ==================================================================== Test                      Result    Flag   Units      Ref Range   Creatinine              41                mg/dL      >=20 ==================================================================== Declared Medications:  The flagging and interpretation on this report are based on the  following declared medications.  Unexpected results may arise from  inaccuracies in the declared medications.   **Note: The testing scope of this panel includes these medications:   Oxycodone (Roxicodone)   **Note: The testing scope of this panel does not include the  following reported medications:   Acetaminophen (Tylenol)  Biotin  Buspirone (Buspar)  Fish Oil  Fluoxetine (Prozac)  Gabapentin (Neurontin)  Ondansetron (Zofran)  Scopolamine  Tizanidine  Trazodone (Desyrel)  Turmeric ==================================================================== For clinical consultation, please call [phone]. ====================================================================      Laboratory Chemistry Profile   Renal Lab Results  Component Value Date   BUN 12 02/01/2021   CREATININE 0.85 02/01/2021   BCR 8 (L) 09/08/2017   GFR 77.34 02/01/2021   GFRAA 93 09/08/2017   GFRNONAA 81 09/08/2017    Hepatic Lab Results  Component Value Date   AST 15 02/01/2021   ALT 11 02/01/2021   ALBUMIN 4.4 02/01/2021   ALKPHOS 59 02/01/2021    Electrolytes Lab Results  Component Value Date   NA 139 02/01/2021   K 4.2 02/01/2021   CL 105 02/01/2021   CALCIUM 9.3 02/01/2021   MG 2.1 09/08/2017    Bone Lab Results  Component Value Date   VD25OH 43.74 01/10/2016   25OHVITD1 51 09/08/2017   25OHVITD2 <1.0 09/08/2017   25OHVITD3 51 09/08/2017    Inflammation (CRP: Acute Phase) (ESR: Chronic Phase) Lab Results  Component Value Date   CRP 1.4 09/08/2017   ESRSEDRATE 11 09/08/2017         Note: Above Lab results reviewed.  Imaging  DG PAIN CLINIC C-ARM 1-60 MIN NO REPORT Fluoro was used, but no Radiologist interpretation will be provided.  Please refer to "NOTES" tab for provider progress  note.  Assessment  The primary encounter diagnosis was Chronic low back pain (Bilateral) w/o sciatica. Diagnoses of Chronic sacroiliac joint pain (Bilateral) (L>R), Chronic hip pain (3ry area of Pain) (Bilateral) (R>L), and Neurogenic pain were also pertinent to this visit.  Plan of Care  Problem-specific:  No problem-specific Assessment & Plan notes found for this encounter.  Ms. ANNISTYN DEPASS has a current medication list which includes the following long-term medication(s): fluoxetine, omeprazole, oxycodone, [START ON 10/12/2021] oxycodone, pregabalin, trazodone, and oxycodone.  Pharmacotherapy (Medications Ordered): Meds ordered this encounter  Medications   pregabalin (LYRICA) 25 MG capsule    Sig: Take 1 capsule (25 mg total) by mouth daily for 7 days, THEN 1 capsule (25 mg total) 2 (two) times daily for 7 days, THEN 1 capsule (25 mg total) 3 (three) times daily.    Dispense:  111 capsule    Refill:  0    Fill one day early if pharmacy is closed on scheduled refill date. May substitute for generic if available.    Orders:  No orders of the defined types were placed in this encounter.  Follow-up plan:   Return for scheduled encounter, for  medication review/adjustment (Lyrica trial).      Interventional Therapies  Risk  Complexity Considerations:   Estimated body mass index is 27.07 kg/m as calculated from the following:   Height as of this encounter: 5' 2"  (1.575 m).   Weight as of this encounter: 148 lb (67.1 kg). WNL   Planned  Pending:      Under consideration:      Completed:   Therapeutic right cervical ESI x1 (08/24/2020) (100/100/50/50)  Diagnostic bilateral GONB x1 (08/19/2019) Palliative right cervical facet MBB x3 (07/23/2018)  Palliative left cervical facet MBB x4 (04/01/2019)  Palliative right cervical facet RFA x1 (09/17/2018) Palliative left cervical facet RFA x1 (05/04/2019) Palliative right L2-3 interlaminar LESI x2  Therapeutic left L1-2 LESI x1  (05/10/2021) (100/100/1 100 x 2 days/0)  Diagnostic/therapeutic left superior cluneal NB (L2, L3 dorsal rami) x1 (04/24/2021) (0/0/0/0)  Palliative right L1 TFESI x1  Bilateral lumbar spinal cord stimulator trial (done) (05/14/2018) (by me)  Bilateral lumbar spinal cord stimulator implant (09/28/2019) (by me)  Bilateral spinal cord stimulator revision (03/14/2020) (by me)  Diagnostic bilateral lumbar facet MBB x3  Diagnostic bilateral SI joint block x2  Palliative left lumbar facet RFA x2 (03/27/2021)  Therapeutic right lumbar facet RFA x2 (02/01/2021) (100/100/0/0)    Therapeutic  Palliative (PRN) options:   Therapeutic right cervical ESI #2  Diagnostic bilateral GONB #2  Palliative bilateral cervical facet blocks #5  Palliative right cervical facet RFA #2  Palliative left cervical facet RFA #2  Palliative right L2-3 interlaminar LESI #3  Palliative right L1 TFESI #2  Diagnostic bilateral lumbar facet block #4  Diagnostic bilateral SI joint block #3     Recent Visits Date Type Provider Dept  09/18/21 Procedure visit Milinda Pointer, MD [facility]  08/13/21 Office Visit Milinda Pointer, MD [facility]  Showing recent visits within past 90 days and meeting all other requirements Today's Visits Date Type Provider Dept  10/04/21 Office Visit Milinda Pointer, MD [facility]  Showing today's visits and meeting all other requirements Future Appointments Date Type Provider Dept  11/05/21 Appointment Milinda Pointer, MD [facility]  Showing future appointments within next 90 days and meeting all other requirements I discussed the assessment and treatment plan with the patient. The patient was provided an opportunity to ask questions and all were answered. The patient agreed with the plan and demonstrated an understanding of the instructions.  Patient advised to call back or seek an in-person evaluation if the symptoms or condition  worsens.  Duration of encounter: 15 minutes.  Note by: Gaspar Cola, MD Date: 10/04/2021; Time: 12:19 PM

## 2021-10-04 NOTE — Progress Notes (Signed)
[name], MD [address]  [place]  [place], [place] [address]  Main: [phone]  Fax: [phone]    Gastroenterology Consultation  Referring Provider:     Pleas Koch, NP Primary Care Physician:  Pleas Koch, NP Primary Gastroenterologist:  Dr. [name] Reason for Consultation:     Chronic constipation, abdominal bloating        HPI:   Karen Edwards is a 55 y.o. Caucasian female referred by Dr. Carlis Abbott, Leticia Penna, NP  for consultation & management of chronic constipation postprandial nausea and vomiting. She has history of chronic neck pain and back pain for which she is on long-term opioid use, chronic tobacco use.  Chronic constipation: reports for several years, associated with significant bloating, hard and lumpy stools with significant straining. She had history of hemorrhoidectomy in 2016 at [facility]. She reports having had a colonoscopy prior to hemorrhoidectomy, polyps were detected, report not available. Patient tried over-the-counter stool softeners, laxatives, fiber supplements, MiraLAX which did not help. She was on linaclotide prescribed by her primary care doctor which provided significant relief. However, patient could not afford $45 co-pay monthly. Therefore, she stopped taking it. Currently she is having bowel movements once a week. She denies rectal bleeding.  Postprandial nausea and vomiting: she reports several month history of postprandial nausea and emesis, which occurs about 15 minutes after eating. This occurs particularly after consuming fatty foods, example hamburger that she had it for dinner last night. She denies epigastric pain other than bloating. She denies weight loss. She never had an EGD in the past. She denies heartburn, dysphagia.  Follow-up visit 10/04/2021 Patient has not seen me since 2019.  She is here for ongoing constipation and abdominal bloating.  Patient tried Linzess 290 MCG  daily with good efficacy however she could not afford co-pay.  She started taking over-the-counter laxative until recently.  Without any bowel regimen, patient goes without having a bowel movement for 2 weeks.  She does report significant abdominal bloating although she has 1 meal a day.  Her diet generally consists of fast foods only.  She denies carbonated beverages, artificial sweeteners, sugary drinks, sweet tea.  Her last BM was 4 days ago with incomplete emptying.  She does report intermittent rectal bleeding, bright red blood per rectum  NSAIDs: none  Antiplts/Anticoagulants/Anti thrombotics: none  GI Procedures: colonoscopy in 2016, report not available EGD and colonoscopy 05/18/2018 - Normal duodenal bulb and second portion of the duodenum. Biopsied. - Erythematous mucosa in the gastric body and antrum. Biopsied. - Non-bleeding erosive gastropathy. - Normal gastroesophageal junction and esophagus.  - One diminutive polyp in the descending colon, removed with a cold biopsy forceps. Resected and retrieved. - One 5 mm polyp in the descending colon, removed with a cold snare. Resected and retrieved. - The distal rectum and anal verge are normal on retroflexion view.  DIAGNOSIS:  A.  DUODENUM; COLD BIOPSY:  - DUODENAL MUCOSA WITH PRESERVED VILLOUS ARCHITECTURE AND BRUNNER'S  GLAND HYPERPLASIA.  - NEGATIVE FOR INTRA-EPITHELIAL LYMPHOCYTOSIS, DYSPLASIA AND MALIGNANCY.   B.  RANDOM STOMACH; COLD BIOPSY:  - MILD CHRONIC GASTRITIS.  - NEGATIVE FOR H. PYLORI, DYSPLASIA AND MALIGNANCY.   C.  COLON POLYP X2, DESCENDING; COLD SNARE/COLD BIOPSY:  - TUBULAR ADENOMA (1), NEGATIVE FOR HIGH-GRADE DYSPLASIA AND MALIGNANCY.  - SERRATED POLYP WITH FEATURES SUGGESTIVE OF AN EARLY SESSILE SERRATED  ADENOMA  She denies family history of GI malignancy  Past Medical History:  Diagnosis Date   Anxiety    Chronic back pain    Degenerative joint disease    Depression    Headache    neck pain    Hypotension    IBS (irritable bowel syndrome) 05/2015   MVP (mitral valve prolapse)    patient denies    Past Surgical History:  Procedure Laterality Date   ANKLE SURGERY Right 03/02/2014   repair of tendon and sural nerve right ankle   CERVICAL SPINE SURGERY  02/21/2016   arthrodesis of anterior cervical spine (fusion of C5-6,C6-7 and insertion of bone allograft   CESAREAN SECTION     CESAREAN SECTION WITH BILATERAL TUBAL LIGATION  1990   COLONOSCOPY  04/19/2015   COLONOSCOPY WITH PROPOFOL N/A 05/18/2018   Procedure: COLONOSCOPY WITH PROPOFOL;  Surgeon: Lin Landsman, MD;  Location: [facility] ENDOSCOPY;  Service: Gastroenterology;  Laterality: N/A;   ESOPHAGOGASTRODUODENOSCOPY (EGD) WITH PROPOFOL N/A 05/18/2018   Procedure: ESOPHAGOGASTRODUODENOSCOPY (EGD) WITH PROPOFOL;  Surgeon: Lin Landsman, MD;  Location: [facility] ENDOSCOPY;  Service: Gastroenterology;  Laterality: N/A;   HEMORRHOID SURGERY N/A 06/16/2015   Procedure: HEMORRHOIDECTOMY;  Surgeon: Leonie Green, MD;  Location: [facility] ORS;  Service: General;  Laterality: N/A;   HYSTEROSCOPY WITH D & C  12/24/2013   LUMBAR SPINAL CORD SIMULATOR REVISION N/A 03/14/2020   Procedure: LUMBAR SPINAL CORD SIMULATOR REVISION;  Surgeon: Milinda Pointer, MD;  Location: [facility] ORS;  Service: Neurosurgery;  Laterality: N/A;   SPINAL CORD STIMULATOR INSERTION N/A 06/18/2018   Procedure: LUMBAR SPINAL CORD STIMULATOR INSERTION;  Surgeon: Milinda Pointer, MD;  Location: [facility] ORS;  Service: Neurosurgery;  Laterality: N/A;   TUBAL LIGATION      Current Outpatient Medications:    acetaminophen (TYLENOL) 500 MG tablet, Take 1,000 mg by mouth every 6 (six) hours as needed (for pain.). , Disp: , Rfl:    FLUoxetine (PROZAC) 20 MG capsule, TAKE 3 CAPSULES BY MOUTH ONCE DAILY FOR DEPRESSION, Disp: 270 capsule, Rfl: 1   omeprazole (PRILOSEC) 20 MG capsule, Take 1 capsule (20 mg total) by mouth daily. For belching and gas., Disp: 90 capsule, Rfl: 0    ondansetron (ZOFRAN-ODT) 4 MG disintegrating tablet, Take 4 mg by mouth every 4 (four) hours as needed (vertigo/dizziness). , Disp: , Rfl:    oxyCODONE (OXY IR/ROXICODONE) 5 MG immediate release tablet, Take 1 tablet (5 mg total) by mouth 2 (two) times daily as needed for severe pain. Must last 30 days., Disp: 40 tablet, Rfl: 0   [START ON 10/12/2021] oxyCODONE (OXY IR/ROXICODONE) 5 MG immediate release tablet, Take 1 tablet (5 mg total) by mouth 2 (two) times daily as needed for severe pain. Must last 30 days., Disp: 40 tablet, Rfl: 0   pregabalin (LYRICA) 25 MG capsule, Take 1 capsule (25 mg total) by mouth daily for 7 days, THEN 1 capsule (25 mg total) 2 (two) times daily for 7 days, THEN 1 capsule (25 mg total) 3 (three) times daily., Disp: 111 capsule, Rfl: 0   scopolamine (TRANSDERM-SCOP) 1 MG/3DAYS, Place 1 patch onto the skin daily as needed (vertigo/dizziness)., Disp: , Rfl:    tiZANidine (ZANAFLEX) 4 MG tablet, TAKE 1 TABLET BY MOUTH EVERY 8 HOURS AS NEEDED FOR MUSCLE SPASM, Disp: 84 tablet, Rfl: 0   traZODone (DESYREL) 100 MG tablet, Take 1-2 tablets (100-200 mg total) by mouth at bedtime as needed for sleep., Disp: 180 tablet, Rfl: 1   linaclotide (LINZESS) 290 MCG CAPS capsule, Take 1 capsule (290 mcg total) by mouth daily  before breakfast., Disp: 90 capsule, Rfl: 2   oxyCODONE (OXY IR/ROXICODONE) 5 MG immediate release tablet, Take 1 tablet (5 mg total) by mouth 2 (two) times daily as needed for severe pain. Must last 30 days., Disp: 40 tablet, Rfl: 0   Family History  Problem Relation Age of Onset   Arthritis Mother    Depression Mother    Hyperlipidemia Mother    Hypertension Mother    Kidney disease Mother    Vision loss Mother    Hypothyroidism Mother    Hearing loss Father    Hyperlipidemia Father    Hypertension Father    Diabetes Mellitus I Maternal Grandmother    Stroke Maternal Grandmother    Congestive Heart Failure Maternal Grandfather    Heart attack Maternal  Grandfather    Prostate cancer Paternal Grandfather    Thyroid disease Maternal Aunt    Diabetes Maternal Aunt    Gout Maternal Aunt      Social History   Tobacco Use   Smoking status: Every Day    Packs/day: 0.50    Years: 37.00    Pack years: 18.50    Types: Cigarettes   Smokeless tobacco: Never  Vaping Use   Vaping Use: Never used  Substance Use Topics   Alcohol use: Yes    Alcohol/week: 1.0 standard drink    Types: 1 Cans of beer per week    Comment: drinks an occasional beer   Drug use: No    Allergies as of 10/04/2021 - Review Complete 10/04/2021  Allergen Reaction Noted   Gabapentin  08/13/2021    Review of Systems:    All systems reviewed and negative except where noted in HPI.   Physical Exam:  BP 122/80 (BP Location: Left Arm, Patient Position: Sitting, Cuff Size: Normal)   Pulse 74   Temp 98.5 F (36.9 C) (Oral)   Ht 5\' 1"  (1.549 m)   Wt 155 lb 3.2 oz (70.4 kg)   LMP 05/05/2017 (Exact Date)   BMI 29.32 kg/m  Patient's last menstrual period was 05/05/2017 (exact date).  General:   Alert,  Well-developed, well-nourished, pleasant and cooperative in NAD Head:  Normocephalic and atraumatic. Eyes:  Sclera clear, no icterus.   Conjunctiva pink. Ears:  Normal auditory acuity. Nose:  No deformity, discharge, or lesions. Mouth:  No deformity or lesions,oropharynx pink & moist. Neck:  Supple; no masses or thyromegaly. Lungs:  Respirations even and unlabored.  Clear throughout to auscultation.   No wheezes, crackles, or rhonchi. No acute distress. Heart:  Regular rate and rhythm; no murmurs, clicks, rubs, or gallops. Abdomen:  Normal bowel sounds. Soft, non-tender and moderately distended, tympanic, without masses, hepatosplenomegaly or hernias noted.  No guarding or rebound tenderness.   Rectal: Not performed Msk:  Symmetrical without gross deformities. Good, equal movement & strength bilaterally. Pulses:  Normal pulses noted. Extremities:  No clubbing or  edema.  No cyanosis. Neurologic:  Alert and oriented x3;  grossly normal neurologically. Skin:  Intact without significant lesions or rashes. No jaundice. Lymph Nodes:  No significant cervical adenopathy. Psych:  Alert and cooperative. Normal mood and affect.  Imaging Studies: No abdominal imaging  Assessment and Plan:   JARELYN BAMBACH is a 55 y.o. Caucasian female with chronic opioid use for musculoskeletal pain, chronic tobacco use, chronic constipation, prior hemorrhoidectomy is seen for follow-up of chronic constipation and abdominal bloating  Chronic constipation and abdominal bloating:Opioid-induced Linaclotide 290 MCG worked for her in the past We will try to  get linaclotide through specialty pharmacy I have provided with samples, linaclotide 290 MCG daily Discussed with her about high-fiber diet, fiber supplements and water intake, information provided Trial of FD guard for abdominal bloating If her symptoms are persistent, given her history of chronic tobacco use, we can give empiric trial of pancreatic enzymes  Personal history of adenomatous polyps of the colon Recommend surveillance colonoscopy in 04/2023   Follow up in 4 months   [name], MD

## 2021-10-10 DIAGNOSIS — H9201 Otalgia, right ear: Secondary | ICD-10-CM | POA: Diagnosis not present

## 2021-10-10 DIAGNOSIS — J029 Acute pharyngitis, unspecified: Secondary | ICD-10-CM | POA: Diagnosis not present

## 2021-10-10 DIAGNOSIS — H73899 Other specified disorders of tympanic membrane, unspecified ear: Secondary | ICD-10-CM | POA: Diagnosis not present

## 2021-10-10 DIAGNOSIS — J00 Acute nasopharyngitis [common cold]: Secondary | ICD-10-CM | POA: Diagnosis not present

## 2021-10-10 DIAGNOSIS — R051 Acute cough: Secondary | ICD-10-CM | POA: Diagnosis not present

## 2021-10-13 ENCOUNTER — Other Ambulatory Visit: Payer: Self-pay | Admitting: Primary Care

## 2021-10-13 DIAGNOSIS — G8929 Other chronic pain: Secondary | ICD-10-CM

## 2021-10-24 ENCOUNTER — Telehealth: Payer: Self-pay | Admitting: Pain Medicine

## 2021-10-24 NOTE — Telephone Encounter (Signed)
Having right sided pain. Please call she has MM appt on 11-05-20

## 2021-10-24 NOTE — Telephone Encounter (Signed)
Schedule her a VV with Dr Dossie Arbour to discuss pain.

## 2021-10-25 ENCOUNTER — Telehealth: Payer: Self-pay

## 2021-10-25 NOTE — Telephone Encounter (Signed)
LM for patient to call for pre virtual appointment questions.  

## 2021-10-29 NOTE — Progress Notes (Signed)
Patient: Karen Edwards  Service Category: E/M  Provider: Gaspar Cola, MD  DOB: 01-Apr-1966  DOS: 10/30/2021  Location: Office  MRN: [id number]  Setting: Ambulatory outpatient  Referring Provider: Pleas Koch, NP  Type: Established Patient  Specialty: Interventional Pain Management  PCP: Pleas Koch, NP  Location: Remote location  Delivery: TeleHealth     Virtual Encounter - Pain Management PROVIDER NOTE: Information contained herein reflects review and annotations entered in association with encounter. Interpretation of such information and data should be left to medically-trained personnel. Information provided to patient can be located elsewhere in the medical record under "Patient Instructions". Document created using STT-dictation technology, any transcriptional errors that may result from process are unintentional.    Contact & Pharmacy Preferred: [phone] Home: [phone] (home) Mobile: [phone] (mobile) E-mail: [email or web address] [address] [place], [place] [address] Phone: [phone] Fax: [phone]   Pre-screening  Karen Edwards offered "in-person" vs "virtual" encounter. She indicated preferring virtual for this encounter.   Reason COVID-19*   Social distancing based on CDC and AMA recommendations.   I contacted Karen Edwards on 10/30/2021 via telephone.      I clearly identified myself as Gaspar Cola, MD. I verified that I was speaking with the correct person using two identifiers (Name: Karen Edwards, and date of birth: 1966/01/06).  Consent I sought verbal advanced consent from Karen Edwards for virtual visit interactions. I informed Karen Edwards of possible security and privacy concerns, risks, and limitations associated with providing "not-in-person" medical evaluation and management services. I also informed Karen Edwards of the availability of "in-person"  appointments. Finally, I informed her that there would be a charge for the virtual visit and that she could be  personally, fully or partially, financially responsible for it. Karen Edwards expressed understanding and agreed to proceed.   Historic Elements   Karen Edwards is a 56 y.o. year old, female patient evaluated today after our last contact on 10/24/2021. Karen Edwards  has a past medical history of Anxiety, Chronic back pain, Degenerative joint disease, Depression, Headache, Hypotension, IBS (irritable bowel syndrome) (05/2015), and MVP (mitral valve prolapse). She also  has a past surgical history that includes Cesarean section; Ankle surgery (Right, 03/02/2014); Hemorrhoid surgery (N/A, 06/16/2015); Cervical spine surgery (02/21/2016); Cesarean section with bilateral tubal ligation (1990); Colonoscopy (04/19/2015); Hysteroscopy with D & C (12/24/2013); Colonoscopy with propofol (N/A, 05/18/2018); Esophagogastroduodenoscopy (egd) with propofol (N/A, 05/18/2018); Spinal cord stimulator insertion (N/A, 06/18/2018); Tubal ligation; and Lumbar spinal cord simulator revision (N/A, 03/14/2020). Karen Edwards has a current medication list which includes the following prescription(s): acetaminophen, fluoxetine, linaclotide, omeprazole, ondansetron, oxycodone, oxycodone, oxycodone, pregabalin, scopolamine, tizanidine, and trazodone. She  reports that she has been smoking cigarettes. She has a 18.50 pack-year smoking history. She has never used smokeless tobacco. She reports current alcohol use of about 1.0 standard drink per week. She reports that she does not use drugs. Karen Edwards is allergic to gabapentin.   HPI  Today, she is being contacted for worsening of previously known (established) problem.  The patient indicates having a flareup of the right lower extremity pain that goes from the buttocks area through the back and lateral aspect of the upper leg to just below the knee.  She denies the pain to be  going into her foot.  She denies any numbness, but describes limited ability to move secondary to the sharp pain.  Today I had the patient do a hyperextension and rotation maneuver towards the right side which she indicated that did reproduce her low back pain.  Then I had the patient do a lateral bending maneuver reaching for her knee and she indicated that that pain exactly reproduced the pain that she has been experiencing going down the leg.  Then I had the patient do a Patrick maneuver on the right side and she indicated that that also trigger some pain in the lower back.  I then proceeded to asked the patient which one of the 3 most closely resembled the pain that she has been experiencing lately and she indicated that the second 1 where she did the lateral bending of the lumbar spine.  She does have an L1-2 disc extrusion.  An MRI done on 04/17/2021 demonstrates her to have a central and right paracentral at this protrusion with cephalad extension at the L1-2 level.  In addition, she has multilevel disc bulges with degenerative disc disease and multilevel facet arthropathy that seems to be bilateral.  In addition, the patient has a disc bulge with a superimposed right foraminal disc protrusion at the L3-4 level with right foraminal stenosis.  For this reason, I will be scheduling the patient to come in for a right L1-2 and L3-4 transforaminal epidural steroid injection under fluoroscopic guidance.  The patient has requested sedation for this procedure.  Pharmacotherapy Assessment   Opioid Analgesic: Oxycodone IR 5 mg, 1 tab PO QD (5 mg/day of oxycodone) PRN MME/day: 7.5 mg/day.   Monitoring: [place] PMP: PDMP reviewed during this encounter.       Pharmacotherapy: No side-effects or adverse reactions reported. Compliance: No problems identified. Effectiveness: Clinically acceptable. Plan: Refer to "POC". UDS:  Summary  Date Value Ref Range Status  04/16/2021 Note  Final    Comment:     ==================================================================== [employer] Select 13 (MW) ==================================================================== Test                             Result       Flag       Units  Drug Absent but Declared for Prescription Verification   Oxycodone                      Not Detected UNEXPECTED ng/mg creat ==================================================================== Test                      Result    Flag   Units      Ref Range   Creatinine              41               mg/dL      >=20 ==================================================================== Declared Medications:  The flagging and interpretation on this report are based on the  following declared medications.  Unexpected results may arise from  inaccuracies in the declared medications.   **Note: The testing scope of this panel includes these medications:   Oxycodone (Roxicodone)   **Note: The testing scope of this panel does not include the  following reported medications:   Acetaminophen (Tylenol)  Biotin  Buspirone (Buspar)  Fish Oil  Fluoxetine (Prozac)  Gabapentin (Neurontin)  Ondansetron (Zofran)  Scopolamine  Tizanidine  Trazodone (Desyrel)  Turmeric ==================================================================== For clinical consultation, please call [phone]. ====================================================================      Laboratory Chemistry Profile   Renal Lab Results  Component  Value Date   BUN 12 02/01/2021   CREATININE 0.85 02/01/2021   BCR 8 (L) 09/08/2017   GFR 77.34 02/01/2021   GFRAA 93 09/08/2017   GFRNONAA 81 09/08/2017    Hepatic Lab Results  Component Value Date   AST 15 02/01/2021   ALT 11 02/01/2021   ALBUMIN 4.4 02/01/2021   ALKPHOS 59 02/01/2021    Electrolytes Lab Results  Component Value Date   NA 139 02/01/2021   K 4.2 02/01/2021   CL 105 02/01/2021   CALCIUM 9.3 02/01/2021   MG 2.1  09/08/2017    Bone Lab Results  Component Value Date   VD25OH 43.74 01/10/2016   25OHVITD1 51 09/08/2017   25OHVITD2 <1.0 09/08/2017   25OHVITD3 51 09/08/2017    Inflammation (CRP: Acute Phase) (ESR: Chronic Phase) Lab Results  Component Value Date   CRP 1.4 09/08/2017   ESRSEDRATE 11 09/08/2017         Note: Above Lab results reviewed.  Imaging  DG PAIN CLINIC C-ARM 1-60 MIN NO REPORT Fluoro was used, but no Radiologist interpretation will be provided.  Please refer to "NOTES" tab for provider progress note.  Assessment  The primary encounter diagnosis was Chronic lumbar radicular pain. 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  Plan of Care  Problem-specific:  No problem-specific Assessment & Plan notes found for this encounter.  Ms. NIGERIA LASSETER has a current medication list which includes the following long-term medication(s): fluoxetine, linaclotide, omeprazole, oxycodone, oxycodone, oxycodone, pregabalin, and trazodone.  Pharmacotherapy (Medications Ordered): No orders of the defined types were placed in this encounter.  Orders:  Orders Placed This Encounter  Procedures   Lumbar Transforaminal Epidural    Standing Status:   Future    Standing Expiration Date:   01/28/2022    Scheduling Instructions:     Side: Right-sided     Level: L1-2 & L3-4     Sedation: With Sedation.     Timeframe: ASAP    Order Specific Question:   Where will this procedure be performed?    Answer:   [facility]   Follow-up plan:   Return for (Clinic)  procedure: (R) L1 & L3 TFESI, (Sed-anx).      Interventional Therapies  Risk   Complexity Considerations:   Estimated body mass index is 27.07 kg/m as calculated from the following:   Height as of this encounter: _0  (1.575 m).   Weight as of this encounter: 148 lb (67.1 kg). WNL   Planned   Pending:      Under consideration:      Completed:   Therapeutic right cervical ESI x1 (08/24/2020) (100/100/50/50)  Diagnostic bilateral GONB x1 (08/19/2019) Palliative right cervical facet MBB x3 (07/23/2018)  Palliative left cervical facet MBB x4 (04/01/2019)  Palliative right cervical facet RFA x1 (09/17/2018) Palliative left cervical facet RFA x1 (05/04/2019) Palliative right L2-3 interlaminar LESI x2  Therapeutic left L1-2 LESI x1 (05/10/2021) (100/100/1 100 x 2 days/0)  Diagnostic/therapeutic left superior cluneal NB (L2, L3 dorsal rami) x1 (04/24/2021) (0/0/0/0)  Palliative right L1 TFESI x1  Bilateral lumbar spinal cord stimulator trial (done) (05/14/2018) (by me)  Bilateral lumbar spinal cord stimulator implant (09/28/2019) (by me)  Bilateral spinal cord stimulator revision (03/14/2020) (by me)  Diagnostic bilateral lumbar facet MBB x3  Diagnostic bilateral SI joint block x2  Palliative left lumbar facet RFA x2 (03/27/2021)  Therapeutic right lumbar facet RFA x2 (02/01/2021) (100/100/0/0)    Therapeutic   Palliative (PRN) options:   Therapeutic right cervical ESI #2  Diagnostic bilateral GONB #2  Palliative bilateral cervical facet blocks #5  Palliative right cervical facet RFA #2  Palliative left cervical facet RFA #2  Palliative right L2-3 interlaminar LESI #3  Palliative right L1 TFESI #2  Diagnostic bilateral lumbar facet block #4  Diagnostic bilateral SI joint block #3     Recent Visits Date Type Provider Dept  10/04/21 Office Visit Milinda Pointer, MD [facility]  09/18/21 Procedure visit Milinda Pointer, MD [facility]  08/13/21 Office Visit  Milinda Pointer, MD [facility]  Showing recent visits within past 90 days and meeting all other requirements Today's Visits Date Type Provider Dept  10/30/21 Office Visit Milinda Pointer, MD [facility]  Showing today's visits and meeting all other requirements Future Appointments Date Type Provider Dept  11/05/21 Appointment Milinda Pointer, MD [facility]  Showing future appointments within next 90 days and meeting all other requirements  I discussed the assessment and treatment plan with the patient. The patient was provided an opportunity to ask questions and all were answered. The patient agreed with the plan and demonstrated an understanding of the instructions.  Patient advised to call back or seek an in-person evaluation if the symptoms or condition worsens.  Duration of encounter: 18 minutes.  Note by: Gaspar Cola, MD Date: 10/30/2021; Time: 9:52 AM

## 2021-10-30 ENCOUNTER — Ambulatory Visit: Payer: Medicare PPO | Attending: Pain Medicine | Admitting: Pain Medicine

## 2021-10-30 ENCOUNTER — Other Ambulatory Visit: Payer: Self-pay

## 2021-10-30 DIAGNOSIS — M25552 Pain in left hip: Secondary | ICD-10-CM

## 2021-10-30 DIAGNOSIS — M5126 Other intervertebral disc displacement, lumbar region: Secondary | ICD-10-CM | POA: Insufficient documentation

## 2021-10-30 DIAGNOSIS — M5441 Lumbago with sciatica, right side: Secondary | ICD-10-CM

## 2021-10-30 DIAGNOSIS — M5481 Occipital neuralgia: Secondary | ICD-10-CM

## 2021-10-30 DIAGNOSIS — M5442 Lumbago with sciatica, left side: Secondary | ICD-10-CM

## 2021-10-30 DIAGNOSIS — M5416 Radiculopathy, lumbar region: Secondary | ICD-10-CM

## 2021-10-30 DIAGNOSIS — R937 Abnormal findings on diagnostic imaging of other parts of musculoskeletal system: Secondary | ICD-10-CM

## 2021-10-30 DIAGNOSIS — M5137 Other intervertebral disc degeneration, lumbosacral region: Secondary | ICD-10-CM | POA: Diagnosis not present

## 2021-10-30 DIAGNOSIS — M48061 Spinal stenosis, lumbar region without neurogenic claudication: Secondary | ICD-10-CM | POA: Diagnosis not present

## 2021-10-30 DIAGNOSIS — G8929 Other chronic pain: Secondary | ICD-10-CM

## 2021-10-30 DIAGNOSIS — M25551 Pain in right hip: Secondary | ICD-10-CM | POA: Diagnosis not present

## 2021-10-30 NOTE — Patient Instructions (Signed)
______________________________________________________________________  Preparing for Procedure with Sedation  NOTICE: Due to recent regulatory changes, starting on May 28, 2021, procedures requiring intravenous (IV) sedation will no longer be performed at the [facility].  These types of procedures are required to be performed at [facility] ambulatory surgery facility.  We are very sorry for the inconvenience.  Procedure appointments are limited to planned procedures: No Prescription Refills. No disability issues will be discussed. No medication changes will be discussed.  Instructions: Oral Intake: Do not eat or drink anything for at least 8 hours prior to your procedure. (Exception: Blood Pressure Medication. See below.) Transportation: A driver is required. You may not drive yourself after the procedure. Blood Pressure Medicine: Do not forget to take your blood pressure medicine with a sip of water the morning of the procedure. If your Diastolic (lower reading) is above 100 mmHg, elective cases will be cancelled/rescheduled. Blood thinners: These will need to be stopped for procedures. Notify our staff if you are taking any blood thinners. Depending on which one you take, there will be specific instructions on how and when to stop it. Diabetics on insulin: Notify the staff so that you can be scheduled 1st case in the morning. If your diabetes requires high dose insulin, take only  of your normal insulin dose the morning of the procedure and notify the staff that you have done so. Preventing infections: Shower with an antibacterial soap the morning of your procedure. Build-up your immune system: Take 1000 mg of Vitamin C with every meal (3 times a day) the day prior to your procedure. Antibiotics: Inform the staff if you have a condition or reason that requires you to take antibiotics before dental procedures. Pregnancy: If you are pregnant, call and cancel the procedure. Sickness: If  you have a cold, fever, or any active infections, call and cancel the procedure. Arrival: You must be in the facility at least 30 minutes prior to your scheduled procedure. Children: Do not bring children with you. Dress appropriately: Bring dark clothing that you would not mind if they get stained. Valuables: Do not bring any jewelry or valuables.  Reasons to call and reschedule or cancel your procedure: (Following these recommendations will minimize the risk of a serious complication.) Surgeries: Avoid having procedures within 2 weeks of any surgery. (Avoid for 2 weeks before or after any surgery). Flu Shots: Avoid having procedures within 2 weeks of a flu shots. (Avoid for 2 weeks before or after immunizations). Barium: Avoid having a procedure within 7-10 days after having had a radiological study involving the use of radiological contrast. (Myelograms, Barium swallow or enema study). Heart attacks: Avoid any elective procedures or surgeries for the initial 6 months after a "Myocardial Infarction" (Heart Attack). Blood thinners: It is imperative that you stop these medications before procedures. Let us know if you if you take any blood thinner.  Infection: Avoid procedures during or within two weeks of an infection (including chest colds or gastrointestinal problems). Symptoms associated with infections include: Localized redness, fever, chills, night sweats or profuse sweating, burning sensation when voiding, cough, congestion, stuffiness, runny nose, sore throat, diarrhea, nausea, vomiting, cold or Flu symptoms, recent or current infections. It is specially important if the infection is over the area that we intend to treat. Heart and lung problems: Symptoms that may suggest an active cardiopulmonary problem include: cough, chest pain, breathing difficulties or shortness of breath, dizziness, ankle swelling, uncontrolled high or unusually low blood pressure, and/or palpitations. If you are    experiencing any of these symptoms, cancel your procedure and contact your primary care physician for an evaluation.  Remember:  Regular Business hours are:  Monday to Thursday 8:00 AM to 4:00 PM  Provider's Schedule: Deeana Atwater, MD:  Procedure days: Tuesday and Thursday 7:30 AM to 4:00 PM  Bilal Lateef, MD:  Procedure days: Monday and Wednesday 7:30 AM to 4:00 PM ______________________________________________________________________  ____________________________________________________________________________________________  General Risks and Possible Complications  Patient Responsibilities: It is important that you read this as it is part of your informed consent. It is our duty to inform you of the risks and possible complications associated with treatments offered to you. It is your responsibility as a patient to read this and to ask questions about anything that is not clear or that you believe was not covered in this document.  Patient's Rights: You have the right to refuse treatment. You also have the right to change your mind, even after initially having agreed to have the treatment done. However, under this last option, if you wait until the last second to change your mind, you may be charged for the materials used up to that point.  Introduction: Medicine is not an exact science. Everything in Medicine, including the lack of treatment(s), carries the potential for danger, harm, or loss (which is by definition: Risk). In Medicine, a complication is a secondary problem, condition, or disease that can aggravate an already existing one. All treatments carry the risk of possible complications. The fact that a side effects or complications occurs, does not imply that the treatment was conducted incorrectly. It must be clearly understood that these can happen even when everything is done following the highest safety standards.  No treatment: You can choose not to proceed with the  proposed treatment alternative. The "PRO(s)" would include: avoiding the risk of complications associated with the therapy. The "CON(s)" would include: not getting any of the treatment benefits. These benefits fall under one of three categories: diagnostic; therapeutic; and/or palliative. Diagnostic benefits include: getting information which can ultimately lead to improvement of the disease or symptom(s). Therapeutic benefits are those associated with the successful treatment of the disease. Finally, palliative benefits are those related to the decrease of the primary symptoms, without necessarily curing the condition (example: decreasing the pain from a flare-up of a chronic condition, such as incurable terminal cancer).  General Risks and Complications: These are associated to most interventional treatments. They can occur alone, or in combination. They fall under one of the following six (6) categories: no benefit or worsening of symptoms; bleeding; infection; nerve damage; allergic reactions; and/or death. No benefits or worsening of symptoms: In Medicine there are no guarantees, only probabilities. No healthcare provider can ever guarantee that a medical treatment will work, they can only state the probability that it may. Furthermore, there is always the possibility that the condition may worsen, either directly, or indirectly, as a consequence of the treatment. Bleeding: This is more common if the patient is taking a blood thinner, either prescription or over the counter (example: Goody Powders, Fish oil, Aspirin, Garlic, etc.), or if suffering a condition associated with impaired coagulation (example: Hemophilia, cirrhosis of the liver, low platelet counts, etc.). However, even if you do not have one on these, it can still happen. If you have any of these conditions, or take one of these drugs, make sure to notify your treating physician. Infection: This is more common in patients with a compromised  immune system, either due to disease (example:   diabetes, cancer, human immunodeficiency virus [HIV], etc.), or due to medications or treatments (example: therapies used to treat cancer and rheumatological diseases). However, even if you do not have one on these, it can still happen. If you have any of these conditions, or take one of these drugs, make sure to notify your treating physician. Nerve Damage: This is more common when the treatment is an invasive one, but it can also happen with the use of medications, such as those used in the treatment of cancer. The damage can occur to small secondary nerves, or to large primary ones, such as those in the spinal cord and brain. This damage may be temporary or permanent and it may lead to impairments that can range from temporary numbness to permanent paralysis and/or brain death. Allergic Reactions: Any time a substance or material comes in contact with our body, there is the possibility of an allergic reaction. These can range from a mild skin rash (contact dermatitis) to a severe systemic reaction (anaphylactic reaction), which can result in death. Death: In general, any medical intervention can result in death, most of the time due to an unforeseen complication. ____________________________________________________________________________________________  

## 2021-10-31 DIAGNOSIS — X501XXA Overexertion from prolonged static or awkward postures, initial encounter: Secondary | ICD-10-CM | POA: Diagnosis not present

## 2021-10-31 DIAGNOSIS — M25571 Pain in right ankle and joints of right foot: Secondary | ICD-10-CM | POA: Diagnosis not present

## 2021-10-31 DIAGNOSIS — S93401A Sprain of unspecified ligament of right ankle, initial encounter: Secondary | ICD-10-CM | POA: Diagnosis not present

## 2021-10-31 DIAGNOSIS — S99911A Unspecified injury of right ankle, initial encounter: Secondary | ICD-10-CM | POA: Diagnosis not present

## 2021-10-31 DIAGNOSIS — F17210 Nicotine dependence, cigarettes, uncomplicated: Secondary | ICD-10-CM | POA: Diagnosis not present

## 2021-11-01 NOTE — Progress Notes (Deleted)
No show to appointment.

## 2021-11-05 ENCOUNTER — Encounter: Payer: Medicare PPO | Admitting: Pain Medicine

## 2021-11-05 DIAGNOSIS — Z79899 Other long term (current) drug therapy: Secondary | ICD-10-CM

## 2021-11-05 DIAGNOSIS — G894 Chronic pain syndrome: Secondary | ICD-10-CM

## 2021-11-05 DIAGNOSIS — G8929 Other chronic pain: Secondary | ICD-10-CM

## 2021-11-05 DIAGNOSIS — Z79891 Long term (current) use of opiate analgesic: Secondary | ICD-10-CM

## 2021-11-05 DIAGNOSIS — M545 Low back pain, unspecified: Secondary | ICD-10-CM

## 2021-11-06 ENCOUNTER — Encounter: Payer: Self-pay | Admitting: Pain Medicine

## 2021-11-08 DIAGNOSIS — S93401A Sprain of unspecified ligament of right ankle, initial encounter: Secondary | ICD-10-CM | POA: Diagnosis not present

## 2021-11-14 NOTE — Progress Notes (Signed)
PROVIDER NOTE: Interpretation of information contained herein should be left to medically-trained personnel. Specific patient instructions are provided elsewhere under "Patient Instructions" section of medical record. This document was created in part using STT-dictation technology, any transcriptional errors that may result from this process are unintentional.  Patient: Karen Edwards Type: Established DOB: 1966-01-23 MRN: [id number] PCP: Pleas Koch, NP  Service: Procedure DOS: 11/15/2021 Setting: Ambulatory Location: Ambulatory outpatient facility Delivery: Face-to-face Provider: Gaspar Cola, MD Specialty: Interventional Pain Management Specialty designation: 09 Location: Outpatient facility Ref. Prov.: Milinda Pointer, MD    Primary Reason for Visit: Interventional Pain Management Treatment. CC: Hip Pain (right)   Procedure:           Type: Trans-Foraminal Epidural Steroid Injection (Lumbar) #2  Laterality: Left  Level: L1 & L3   Imaging: Fluoroscopic guidance Anesthesia: Local anesthesia (1-2% Lidocaine) Anxiolysis: IV (Versed 2 mg) Sedation: None.  DOS: 11/15/2021  Performed by: Gaspar Cola, MD  Purpose: Diagnostic/Therapeutic Indications: Lower back and lower extremity pain secondary to lumbosacral radiculopathy/radiculitis severe enough to impact quality of life or function. 1. Chronic low back pain (1ry area of Pain) (Bilateral) (R>L) w/ sciatica (Bilateral)   2. Chronic lower extremity pain (2ry area of Pain) (Bilateral) (R>L)   3. Chronic hip pain (3ry area of Pain) (Bilateral) (R>L)   4. DDD (degenerative disc disease), lumbosacral   5. L1-2 disc extrusion (Right)   6. Lumbar facet hypertrophy (Bilateral)   7. 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 Lumbar foraminal stenosis (Bilateral: L4-5) (Right: L3-4)   9. Lumbar radicular pain (Right) (L4)   10. Lumbar radiculopathy   11. Lumbar spondylosis   12. Protrusion of lumbar  intervertebral disc (Right: L1-2, L3-4)   13. Presence of neurostimulator (Thoracolumbar)    NAS-11 Pain score:   Pre-procedure: 8 /10   Post-procedure: 0-No pain/10     Position / Prep / Materials:  Position: Prone  Prep solution: DuraPrep (Iodine Povacrylex [0.7% available iodine] and Isopropyl Alcohol, 74% w/w) Prep Area: Entire Posterior Lumbosacral Area.  From the lower tip of the scapula down to the tailbone and from flank to flank. Materials:  Tray: Block Needle(s):  Type: Spinal  Gauge (G): 22  Length: 5-in  Qty: 2  Pre-op H&P Assessment:  Ms. Tellefsen is a 56 y.o. (year old), female patient, seen today for interventional treatment. She  has a past surgical history that includes Cesarean section; Ankle surgery (Right, 03/02/2014); Hemorrhoid surgery (N/A, 06/16/2015); Cervical spine surgery (02/21/2016); Cesarean section with bilateral tubal ligation (1990); Colonoscopy (04/19/2015); Hysteroscopy with D & C (12/24/2013); Colonoscopy with propofol (N/A, 05/18/2018); Esophagogastroduodenoscopy (egd) with propofol (N/A, 05/18/2018); Spinal cord stimulator insertion (N/A, 06/18/2018); Tubal ligation; and Lumbar spinal cord simulator revision (N/A, 03/14/2020). Ms. Derrick has a current medication list which includes the following prescription(s): acetaminophen, fluoxetine, linaclotide, omeprazole, ondansetron, oxycodone, [START ON 12/15/2021] oxycodone, [START ON 01/14/2022] oxycodone, pregabalin, scopolamine, tizanidine, and trazodone, and the following Facility-Administered Medications: pentafluoroprop-tetrafluoroeth. Her primarily concern today is the Hip Pain (right)  Initial Vital Signs:  Pulse/HCG Rate: 90  Temp: 97.7 F (36.5 C) Resp: 16 BP: 119/70 SpO2: 98 %  BMI: Estimated body mass index is 27.4 kg/m as calculated from the following:   Height as of this encounter: 5\' 1"  (1.549 m).   Weight as of this encounter: 145 lb (65.8 kg).  Risk Assessment: Allergies: Reviewed. She  is allergic to gabapentin.  Allergy Precautions: None required Coagulopathies: Reviewed. None identified.  Blood-thinner therapy: None  at this time Active Infection(s): Reviewed. None identified. Ms. Schindel is afebrile  Site Confirmation: Ms. Mcguirk was asked to confirm the procedure and laterality before marking the site Procedure checklist: Completed Consent: Before the procedure and under the influence of no sedative(s), amnesic(s), or anxiolytics, the patient was informed of the treatment options, risks and possible complications. To fulfill our ethical and legal obligations, as recommended by the American Medical Association's Code of Ethics, I have informed the patient of my clinical impression; the nature and purpose of the treatment or procedure; the risks, benefits, and possible complications of the intervention; the alternatives, including doing nothing; the risk(s) and benefit(s) of the alternative treatment(s) or procedure(s); and the risk(s) and benefit(s) of doing nothing. The patient was provided information about the general risks and possible complications associated with the procedure. These may include, but are not limited to: failure to achieve desired goals, infection, bleeding, organ or nerve damage, allergic reactions, paralysis, and death. In addition, the patient was informed of those risks and complications associated to Spine-related procedures, such as failure to decrease pain; infection (i.e.: Meningitis, epidural or intraspinal abscess); bleeding (i.e.: epidural hematoma, subarachnoid hemorrhage, or any other type of intraspinal or peri-dural bleeding); organ or nerve damage (i.e.: Any type of peripheral nerve, nerve root, or spinal cord injury) with subsequent damage to sensory, motor, and/or autonomic systems, resulting in permanent pain, numbness, and/or weakness of one or several areas of the body; allergic reactions; (i.e.: anaphylactic reaction); and/or  death. Furthermore, the patient was informed of those risks and complications associated with the medications. These include, but are not limited to: allergic reactions (i.e.: anaphylactic or anaphylactoid reaction(s)); adrenal axis suppression; blood sugar elevation that in diabetics may result in ketoacidosis or comma; water retention that in patients with history of congestive heart failure may result in shortness of breath, pulmonary edema, and decompensation with resultant heart failure; weight gain; swelling or edema; medication-induced neural toxicity; particulate matter embolism and blood vessel occlusion with resultant organ, and/or nervous system infarction; and/or aseptic necrosis of one or more joints. Finally, the patient was informed that Medicine is not an exact science; therefore, there is also the possibility of unforeseen or unpredictable risks and/or possible complications that may result in a catastrophic outcome. The patient indicated having understood very clearly. We have given the patient no guarantees and we have made no promises. Enough time was given to the patient to ask questions, all of which were answered to the patient's satisfaction. Ms. Schiller has indicated that she wanted to continue with the procedure. Attestation: I, the ordering provider, attest that I have discussed with the patient the benefits, risks, side-effects, alternatives, likelihood of achieving goals, and potential problems during recovery for the procedure that I have provided informed consent. Date   Time: 11/15/2021  9:41 AM  Pre-Procedure Preparation:  Monitoring: As per clinic protocol. Respiration, ETCO2, SpO2, BP, heart rate and rhythm monitor placed and checked for adequate function Safety Precautions: Patient was assessed for positional comfort and pressure points before starting the procedure. Time-out: I initiated and conducted the "Time-out" before starting the procedure, as per protocol. The  patient was asked to participate by confirming the accuracy of the "Time Out" information. Verification of the correct person, site, and procedure were performed and confirmed by me, the nursing staff, and the patient. "Time-out" conducted as per Joint Commission's Universal Protocol (UP.01.01.01). Time: 1045  Description/Narrative of Procedure:          Target: The 6 o'clock position  under the pedicle, on the affected side. Region: Posterolateral Lumbosacral Approach: Posterior Percutaneous Paravertebral approach.  Rationale (medical necessity): procedure needed and proper for the diagnosis and/or treatment of the patient's medical symptoms and needs. Procedural Technique Safety Precautions: Aspiration looking for blood return was conducted prior to all injections. At no point did we inject any substances, as a needle was being advanced. No attempts were made at seeking any paresthesias. Safe injection practices and needle disposal techniques used. Medications properly checked for expiration dates. SDV (single dose vial) medications used. Description of the Procedure: Protocol guidelines were followed. The patient was placed in position over the procedure table. The target area was identified and the area prepped in the usual manner. Skin & deeper tissues infiltrated with local anesthetic. Appropriate amount of time allowed to pass for local anesthetics to take effect. The procedure needles were then advanced to the target area. Proper needle placement secured. Negative aspiration confirmed. Solution injected in intermittent fashion, asking for systemic symptoms every 0.5cc of injectate. The needles were then removed and the area cleansed, making sure to leave some of the prepping solution back to take advantage of its long term bactericidal properties.  Vitals:   11/15/21 1050 11/15/21 1059 11/15/21 1102 11/15/21 1107  BP: 137/78 (!) 141/80 (!) 81/66 136/73  Pulse: 73 72 70 72  Resp: 16 16 16 16    Temp:   (!) 97.4 F (36.3 C)   TempSrc:   Temporal   SpO2: 100% 100% 100% 99%  Weight:      Height:        Start Time: 1045 hrs. End Time: 1053 hrs.  Imaging Guidance (Spinal):          Type of Imaging Technique: Fluoroscopy Guidance (Spinal) Indication(s): Assistance in needle guidance and placement for procedures requiring needle placement in or near specific anatomical locations not easily accessible without such assistance. Exposure Time: Please see nurses notes. Contrast: Before injecting any contrast, we confirmed that the patient did not have an allergy to iodine, shellfish, or radiological contrast. Once satisfactory needle placement was completed at the desired level, radiological contrast was injected. Contrast injected under live fluoroscopy. No contrast complications. See chart for type and volume of contrast used. Fluoroscopic Guidance: I was personally present during the use of fluoroscopy. "Tunnel Vision Technique" used to obtain the best possible view of the target area. Parallax error corrected before commencing the procedure. "Direction-depth-direction" technique used to introduce the needle under continuous pulsed fluoroscopy. Once target was reached, antero-posterior, oblique, and lateral fluoroscopic projection used confirm needle placement in all planes. Images permanently stored in EMR. Interpretation: I personally interpreted the imaging intraoperatively. Adequate needle placement confirmed in multiple planes. Appropriate spread of contrast into desired area was observed. No evidence of afferent or efferent intravascular uptake. No intrathecal or subarachnoid spread observed. Permanent images saved into the patient's record.  Antibiotic Prophylaxis:   Anti-infectives (From admission, onward)    None      Indication(s): None identified  Post-operative Assessment:  Post-procedure Vital Signs:  Pulse/HCG Rate: 72  Temp: (!) 97.4 F (36.3 C) Resp: 16 BP:  136/73 SpO2: 99 %  EBL: None  Complications: No immediate post-treatment complications observed by team, or reported by patient.  Note: The patient tolerated the entire procedure well. A repeat set of vitals were taken after the procedure and the patient was kept under observation following institutional policy, for this type of procedure. Post-procedural neurological assessment was performed, showing return to baseline, prior to discharge.  The patient was provided with post-procedure discharge instructions, including a section on how to identify potential problems. Should any problems arise concerning this procedure, the patient was given instructions to immediately contact us, at any time, without hesitation. In any case, we plan to contact the patient by telephone for a follow-up status report regarding this interventional procedure.  Comments:  No additional relevant information.  Plan of Care  Orders:  Orders Placed This Encounter  Procedures   Lumbar Transforaminal Epidural    Scheduling Instructions:     Side: Left-sided     Level: L1 & L3     Sedation: Patient's choice.     Timeframe: Today    Order Specific Question:   Where will this procedure be performed?    Answer:   [facility]   DG [facility] C-ARM 1-60 MIN NO REPORT    Intraoperative interpretation by procedural physician at [facility] [place].    Standing Status:   Standing    Number of Occurrences:   1    Order Specific Question:   Reason for exam:    Answer:   Assistance in needle guidance and placement for procedures requiring needle placement in or near specific anatomical locations not easily accessible without such assistance.   Informed Consent Details: Physician/Practitioner Attestation; Transcribe to consent form and obtain patient signature    Provider Attestation: I, Piqua Dossie Arbour, MD, (Pain Management Specialist), the physician/practitioner, attest that I have discussed with the patient  the benefits, risks, side effects, alternatives, likelihood of achieving goals and potential problems during recovery for the procedure that I have provided informed consent.    Scheduling Instructions:     Note: Always confirm laterality of pain with Ms. Tye Savoy, before procedure.     Transcribe to consent form and obtain patient signature.    Order Specific Question:   Physician/Practitioner attestation of informed consent for procedure/surgical case    Answer:   I, the physician/practitioner, attest that I have discussed with the patient the benefits, risks, side effects, alternatives, likelihood of achieving goals and potential problems during recovery for the procedure that I have provided informed consent.    Order Specific Question:   Procedure    Answer:   Diagnostic lumbar transforaminal epidural steroid injection under fluoroscopic guidance. (See notes for level and laterality.)    Order Specific Question:   Physician/Practitioner performing the procedure    Answer:   Faris Coolman A. Dossie Arbour, MD    Order Specific Question:   Indication/Reason    Answer:   Lumbar radiculopathy/radiculitis associated with lumbar stenosis   Provide equipment / supplies at bedside    "Block Tray" (Disposable   single use) Needle type: SpinalSpinal Amount/quantity: 2 Size: Medium (5-inch) Gauge: 22G    Standing Status:   Standing    Number of Occurrences:   1    Order Specific Question:   Specify    Answer:   Block Tray   Chronic Opioid Analgesic:  Oxycodone IR 5 mg, 1 tab PO QD (5 mg/day of oxycodone) PRN MME/day: 7.5 mg/day.   Medications ordered for procedure: Meds ordered this encounter  Medications   iohexol (OMNIPAQUE) 180 MG/ML injection 10 mL    Must be Myelogram-compatible. If not available, you may substitute with a water-soluble, non-ionic, hypoallergenic, myelogram-compatible radiological contrast medium.   lidocaine (XYLOCAINE) 2 % (with pres) injection 400 mg    pentafluoroprop-tetrafluoroeth (GEBAUERS) aerosol   lactated ringers infusion 1,000 mL   midazolam (VERSED) 5 MG/5ML injection 0.5-2  mg    Make sure Flumazenil is available in the pyxis when using this medication. If oversedation occurs, administer 0.2 mg IV over 15 sec. If after 45 sec no response, administer 0.2 mg again over 1 min; may repeat at 1 min intervals; not to exceed 4 doses (1 mg)   sodium chloride flush (NS) 0.9 % injection 2 mL   ropivacaine (PF) 2 mg/mL (0.2%) (NAROPIN) injection 2 mL   dexamethasone (DECADRON) injection 20 mg   oxyCODONE (OXY IR/ROXICODONE) 5 MG immediate release tablet    Sig: Take 1 tablet (5 mg total) by mouth 2 (two) times daily as needed for severe pain. Must last 30 days.    Dispense:  40 tablet    Refill:  0    DO NOT: delete (not duplicate); no partial-fill (will deny script to complete), no refill request (F/U required). DISPENSE: 1 day early if closed on fill date. WARN: No CNS-depressants within 8 hrs of med.   oxyCODONE (OXY IR/ROXICODONE) 5 MG immediate release tablet    Sig: Take 1 tablet (5 mg total) by mouth 2 (two) times daily as needed for severe pain. Must last 30 days.    Dispense:  40 tablet    Refill:  0    DO NOT: delete (not duplicate); no partial-fill (will deny script to complete), no refill request (F/U required). DISPENSE: 1 day early if closed on fill date. WARN: No CNS-depressants within 8 hrs of med.   oxyCODONE (OXY IR/ROXICODONE) 5 MG immediate release tablet    Sig: Take 1 tablet (5 mg total) by mouth 2 (two) times daily as needed for severe pain. Must last 30 days.    Dispense:  40 tablet    Refill:  0    DO NOT: delete (not duplicate); no partial-fill (will deny script to complete), no refill request (F/U required). DISPENSE: 1 day early if closed on fill date. WARN: No CNS-depressants within 8 hrs of med.   Medications administered: We administered iohexol, lidocaine, lactated ringers, midazolam, sodium chloride flush,  ropivacaine (PF) 2 mg/mL (0.2%), and dexamethasone.  See the medical record for exact dosing, route, and time of administration.  Follow-up plan:   Return in about 2 weeks (around 11/29/2021) for Proc-day (T,Th), (VV), (PPE).        Interventional Therapies  Risk   Complexity Considerations:   Estimated body mass index is 27.07 kg/m as calculated from the following:   Height as of this encounter: 5\' 2"  (1.575 m).   Weight as of this encounter: 148 lb (67.1 kg). WNL   Planned   Pending:      Under consideration:      Completed:   Therapeutic right cervical ESI x1 (08/24/2020) (100/100/50/50)  Diagnostic bilateral GONB x1 (08/19/2019) Palliative right cervical facet MBB x3 (07/23/2018)  Palliative left cervical facet MBB x4 (04/01/2019)  Palliative right cervical facet RFA x1 (09/17/2018) Palliative left cervical facet RFA x1 (05/04/2019) Palliative right L2-3 interlaminar LESI x2  Therapeutic left L1-2 LESI x1 (05/10/2021) (100/100/1 100 x 2 days/0)  Diagnostic/therapeutic left superior cluneal NB (L2, L3 dorsal rami) x1 (04/24/2021) (0/0/0/0)  Palliative right L1 TFESI x1  Bilateral lumbar spinal cord stimulator trial (done) (05/14/2018) (by me)  Bilateral lumbar spinal cord stimulator implant (09/28/2019) (by me)  Bilateral spinal cord stimulator revision (03/14/2020) (by me)  Diagnostic bilateral lumbar facet MBB x3  Diagnostic bilateral SI joint block x2  Palliative left lumbar facet RFA x2 (03/27/2021)  Therapeutic right lumbar facet RFA x2 (  02/01/2021) (100/100/0/0)    Therapeutic   Palliative (PRN) options:   Therapeutic right cervical ESI #2  Diagnostic bilateral GONB #2  Palliative bilateral cervical facet blocks #5  Palliative right cervical facet RFA #2  Palliative left cervical facet RFA #2  Palliative right L2-3 interlaminar LESI #3  Palliative right L1 TFESI #2  Diagnostic bilateral lumbar facet block #4  Diagnostic bilateral SI joint block #3      Recent Visits Date  Type Provider Dept  10/30/21 Office Visit Milinda Pointer, MD [facility]  10/04/21 Office Visit Milinda Pointer, MD [facility]  09/18/21 Procedure visit Milinda Pointer, MD [facility]  Showing recent visits within past 90 days and meeting all other requirements Today's Visits Date Type Provider Dept  11/15/21 Procedure visit Milinda Pointer, MD [facility]  Showing today's visits and meeting all other requirements Future Appointments Date Type Provider Dept  11/29/21 Appointment Milinda Pointer, [facility]  02/06/22 Appointment Milinda Pointer, MD [facility]  Showing future appointments within next 90 days and meeting all other requirements  Disposition: Discharge home  Discharge (Date   Time): 11/15/2021; 1107 hrs.   Primary Care Physician: Pleas Koch, NP Location: [facility] Note by: Gaspar Cola, MD Date: 11/15/2021; Time: 12:21 PM  Disclaimer:  Medicine is not an Chief Strategy Officer. The only guarantee in medicine is that nothing is guaranteed. It is important to note that the decision to proceed with this intervention was based on the information collected from the patient. The Data and conclusions were drawn from the patient's questionnaire, the interview, and the physical examination. Because the information was provided in large part by the patient, it cannot be guaranteed that it has not been purposely or unconsciously manipulated. Every effort has been made to obtain as much relevant data as possible for this evaluation. It is important to note that the conclusions that lead to this procedure are derived in large part from the available data. Always take into account that the treatment will also be dependent on availability of resources and existing treatment guidelines, considered by other Pain Management Practitioners as being common knowledge and practice, at  the time of the intervention. For Medico-Legal purposes, it is also important to point out that variation in procedural techniques and pharmacological choices are the acceptable norm. The indications, contraindications, technique, and results of the above procedure should only be interpreted and judged by a Board-Certified Interventional Pain Specialist with extensive familiarity and expertise in the same exact procedure and technique.

## 2021-11-15 ENCOUNTER — Ambulatory Visit (HOSPITAL_BASED_OUTPATIENT_CLINIC_OR_DEPARTMENT_OTHER): Payer: Medicare PPO | Admitting: Pain Medicine

## 2021-11-15 ENCOUNTER — Other Ambulatory Visit: Payer: Self-pay

## 2021-11-15 ENCOUNTER — Ambulatory Visit
Admission: RE | Admit: 2021-11-15 | Discharge: 2021-11-15 | Disposition: A | Discharge status: Home / Self Care | Payer: Medicare PPO | Source: Ambulatory Visit | Attending: Pain Medicine | Admitting: Pain Medicine

## 2021-11-15 ENCOUNTER — Encounter: Payer: Self-pay | Admitting: Pain Medicine

## 2021-11-15 VITALS — BP 136/73 | HR 72 | Temp 97.4°F | Resp 16 | Ht 61.0 in | Wt 145.0 lb

## 2021-11-15 DIAGNOSIS — G8929 Other chronic pain: Secondary | ICD-10-CM

## 2021-11-15 DIAGNOSIS — M5441 Lumbago with sciatica, right side: Secondary | ICD-10-CM | POA: Insufficient documentation

## 2021-11-15 DIAGNOSIS — M545 Low back pain, unspecified: Secondary | ICD-10-CM | POA: Diagnosis not present

## 2021-11-15 DIAGNOSIS — M25551 Pain in right hip: Secondary | ICD-10-CM

## 2021-11-15 DIAGNOSIS — M25552 Pain in left hip: Secondary | ICD-10-CM | POA: Diagnosis not present

## 2021-11-15 DIAGNOSIS — M5442 Lumbago with sciatica, left side: Secondary | ICD-10-CM

## 2021-11-15 DIAGNOSIS — Z79899 Other long term (current) drug therapy: Secondary | ICD-10-CM

## 2021-11-15 DIAGNOSIS — M5416 Radiculopathy, lumbar region: Secondary | ICD-10-CM

## 2021-11-15 DIAGNOSIS — M47816 Spondylosis without myelopathy or radiculopathy, lumbar region: Secondary | ICD-10-CM

## 2021-11-15 DIAGNOSIS — Z9682 Presence of neurostimulator: Secondary | ICD-10-CM | POA: Insufficient documentation

## 2021-11-15 DIAGNOSIS — M48061 Spinal stenosis, lumbar region without neurogenic claudication: Secondary | ICD-10-CM | POA: Diagnosis not present

## 2021-11-15 DIAGNOSIS — G894 Chronic pain syndrome: Secondary | ICD-10-CM | POA: Insufficient documentation

## 2021-11-15 DIAGNOSIS — M79605 Pain in left leg: Secondary | ICD-10-CM | POA: Insufficient documentation

## 2021-11-15 DIAGNOSIS — Z79891 Long term (current) use of opiate analgesic: Secondary | ICD-10-CM | POA: Diagnosis not present

## 2021-11-15 DIAGNOSIS — Z5189 Encounter for other specified aftercare: Secondary | ICD-10-CM

## 2021-11-15 DIAGNOSIS — M79604 Pain in right leg: Secondary | ICD-10-CM | POA: Insufficient documentation

## 2021-11-15 DIAGNOSIS — M5126 Other intervertebral disc displacement, lumbar region: Secondary | ICD-10-CM

## 2021-11-15 DIAGNOSIS — M5137 Other intervertebral disc degeneration, lumbosacral region: Secondary | ICD-10-CM

## 2021-11-15 MED ORDER — OXYCODONE HCL 5 MG PO TABS: 5.0000 mg | ORAL_TABLET | Freq: Two times a day (BID) | ORAL | 0 refills | Status: DC | PRN

## 2021-11-15 MED ORDER — LIDOCAINE HCL 2 % IJ SOLN
20.0000 mL | Freq: Once | INTRAMUSCULAR | Status: AC
  Administered 2021-11-15: 400 mg
  Filled 2021-11-15: qty 20

## 2021-11-15 MED ORDER — LACTATED RINGERS IV SOLN
1000.0000 mL | Freq: Once | INTRAVENOUS | Status: AC
  Administered 2021-11-15: 1000 mL via INTRAVENOUS

## 2021-11-15 MED ORDER — ROPIVACAINE HCL 2 MG/ML IJ SOLN
INTRAMUSCULAR | Status: AC
  Filled 2021-11-15: qty 20

## 2021-11-15 MED ORDER — DEXAMETHASONE SODIUM PHOSPHATE 10 MG/ML IJ SOLN
20.0000 mg | Freq: Once | INTRAMUSCULAR | Status: AC
  Administered 2021-11-15: 10 mg
  Filled 2021-11-15: qty 2

## 2021-11-15 MED ORDER — IOHEXOL 180 MG/ML  SOLN
10.0000 mL | Freq: Once | INTRAMUSCULAR | Status: AC
  Administered 2021-11-15: 5 mL via INTRATHECAL

## 2021-11-15 MED ORDER — SODIUM CHLORIDE 0.9% FLUSH
2.0000 mL | Freq: Once | INTRAVENOUS | Status: AC
  Administered 2021-11-15: 2 mL

## 2021-11-15 MED ORDER — SODIUM CHLORIDE (PF) 0.9 % IJ SOLN
INTRAMUSCULAR | Status: AC
  Filled 2021-11-15: qty 10

## 2021-11-15 MED ORDER — MIDAZOLAM HCL 5 MG/5ML IJ SOLN
0.5000 mg | Freq: Once | INTRAMUSCULAR | Status: AC
  Administered 2021-11-15: 2 mg via INTRAVENOUS
  Filled 2021-11-15: qty 5

## 2021-11-15 MED ORDER — PENTAFLUOROPROP-TETRAFLUOROETH EX AERO
INHALATION_SPRAY | Freq: Once | CUTANEOUS | Status: DC
  Filled 2021-11-15: qty 116

## 2021-11-15 MED ORDER — ROPIVACAINE HCL 2 MG/ML IJ SOLN
2.0000 mL | Freq: Once | INTRAMUSCULAR | Status: AC
  Administered 2021-11-15: 2 mL via EPIDURAL

## 2021-11-15 MED ORDER — DEXAMETHASONE SODIUM PHOSPHATE 10 MG/ML IJ SOLN
INTRAMUSCULAR | Status: AC
  Filled 2021-11-15: qty 1

## 2021-11-15 NOTE — Patient Instructions (Signed)

## 2021-11-15 NOTE — Progress Notes (Signed)
Nursing Pain Medication Assessment:  Safety precautions to be maintained throughout the outpatient stay will include: orient to surroundings, keep bed in low position, maintain call bell within reach at all times, provide assistance with transfer out of bed and ambulation.  Medication Inspection Compliance: Pill count conducted under aseptic conditions, in front of the patient. Neither the pills nor the bottle was removed from the patient's sight at any time. Once count was completed pills were immediately returned to the patient in their original bottle.  Medication: Oxycodone IR Pill/Patch Count:  12 of 40 pills remain Pill/Patch Appearance: Markings consistent with prescribed medication Bottle Appearance: Standard pharmacy container. Clearly labeled. Filled Date: 47 / 17 / 2022 Last Medication intake:  Today

## 2021-11-16 ENCOUNTER — Telehealth: Payer: Self-pay

## 2021-11-16 NOTE — Telephone Encounter (Signed)
Post procedure phone call.  Patient states she is doing well.  

## 2021-11-20 ENCOUNTER — Encounter: Payer: Self-pay | Admitting: Gastroenterology

## 2021-11-20 ENCOUNTER — Other Ambulatory Visit: Payer: Self-pay | Admitting: Gastroenterology

## 2021-11-20 DIAGNOSIS — K5909 Other constipation: Secondary | ICD-10-CM

## 2021-11-20 MED ORDER — PEG 3350-KCL-NA BICARB-NACL 420 G PO SOLR: 4000.0000 mL | Freq: Once | ORAL | 0 refills | Status: AC

## 2021-11-28 ENCOUNTER — Other Ambulatory Visit: Payer: Self-pay | Admitting: Primary Care

## 2021-11-28 DIAGNOSIS — G8929 Other chronic pain: Secondary | ICD-10-CM

## 2021-11-28 DIAGNOSIS — M7918 Myalgia, other site: Secondary | ICD-10-CM

## 2021-11-28 NOTE — Telephone Encounter (Signed)
How often is she taking tizanidine muscle relaxer? I just refilled this in late December 2022 for 90 pills.

## 2021-11-29 ENCOUNTER — Ambulatory Visit: Payer: Medicare PPO | Attending: Pain Medicine | Admitting: Pain Medicine

## 2021-11-29 ENCOUNTER — Other Ambulatory Visit: Payer: Self-pay

## 2021-11-29 DIAGNOSIS — M79605 Pain in left leg: Secondary | ICD-10-CM

## 2021-11-29 DIAGNOSIS — M79604 Pain in right leg: Secondary | ICD-10-CM | POA: Diagnosis not present

## 2021-11-29 DIAGNOSIS — M5442 Lumbago with sciatica, left side: Secondary | ICD-10-CM | POA: Diagnosis not present

## 2021-11-29 DIAGNOSIS — R937 Abnormal findings on diagnostic imaging of other parts of musculoskeletal system: Secondary | ICD-10-CM | POA: Diagnosis not present

## 2021-11-29 DIAGNOSIS — M25552 Pain in left hip: Secondary | ICD-10-CM

## 2021-11-29 DIAGNOSIS — M5126 Other intervertebral disc displacement, lumbar region: Secondary | ICD-10-CM | POA: Diagnosis not present

## 2021-11-29 DIAGNOSIS — Z9682 Presence of neurostimulator: Secondary | ICD-10-CM | POA: Diagnosis not present

## 2021-11-29 DIAGNOSIS — M5441 Lumbago with sciatica, right side: Secondary | ICD-10-CM | POA: Diagnosis not present

## 2021-11-29 DIAGNOSIS — G8929 Other chronic pain: Secondary | ICD-10-CM

## 2021-11-29 DIAGNOSIS — M25551 Pain in right hip: Secondary | ICD-10-CM

## 2021-11-29 NOTE — Progress Notes (Signed)
Patient: Karen Edwards  Service Category: E/M  Provider: Gaspar Cola, MD  DOB: 1966/01/26  DOS: 11/29/2021  Location: Office  MRN: [id number]  Setting: Ambulatory outpatient  Referring Provider: Pleas Koch, NP  Type: Established Patient  Specialty: Interventional Pain Management  PCP: Pleas Koch, NP  Location: Remote location  Delivery: TeleHealth     Virtual Encounter - Pain Management PROVIDER NOTE: Information contained herein reflects review and annotations entered in association with encounter. Interpretation of such information and data should be left to medically-trained personnel. Information provided to patient can be located elsewhere in the medical record under "Patient Instructions". Document created using STT-dictation technology, any transcriptional errors that may result from process are unintentional.    Contact & Pharmacy Preferred: [phone] Home: [phone] (home) Mobile: [phone] (mobile) E-mail: [email or web address], [place] [address] Phone: [phone] Fax: [phone]   Pre-screening  Ms. Catino offered "in-person" vs "virtual" encounter. She indicated preferring virtual for this encounter.   Reason COVID-19*   Social distancing based on CDC and AMA recommendations.   I contacted Karen Edwards on 11/29/2021 via telephone.      I clearly identified myself as Gaspar Cola, MD. I verified that I was speaking with the correct person using two identifiers (Name: NGA RABON, and date of birth: 09-28-1966).  Consent I sought verbal advanced consent from Karen Edwards for virtual visit interactions. I informed Ms. Khurana of possible security and privacy concerns, risks, and limitations associated with providing "not-in-person" medical evaluation and management services. I also informed Ms. Alderfer of the availability of "in-person"  appointments. Finally, I informed her that there would be a charge for the virtual visit and that she could be  personally, fully or partially, financially responsible for it. Ms. Fleece expressed understanding and agreed to proceed.   Historic Elements   Karen Edwards is a 56 y.o. year old, female patient evaluated today after our last contact on 11/15/2021. Karen Edwards  has a past medical history of Anxiety, Chronic back pain, Degenerative joint disease, Depression, Headache, Hypotension, IBS (irritable bowel syndrome) (05/2015), and MVP (mitral valve prolapse). She also  has a past surgical history that includes Cesarean section; Ankle surgery (Right, 03/02/2014); Hemorrhoid surgery (N/A, 06/16/2015); Cervical spine surgery (02/21/2016); Cesarean section with bilateral tubal ligation (1990); Colonoscopy (04/19/2015); Hysteroscopy with D & C (12/24/2013); Colonoscopy with propofol (N/A, 05/18/2018); Esophagogastroduodenoscopy (egd) with propofol (N/A, 05/18/2018); Spinal cord stimulator insertion (N/A, 06/18/2018); Tubal ligation; and Lumbar spinal cord simulator revision (N/A, 03/14/2020). [name] has a current medication list which includes the following prescription(s): acetaminophen, fluoxetine, linaclotide, omeprazole, ondansetron, oxycodone, [START ON 12/15/2021] oxycodone, [START ON 01/14/2022] oxycodone, pregabalin, scopolamine, tizanidine, and trazodone. She  reports that she has been smoking cigarettes. She has a 18.50 pack-year smoking history. She has never used smokeless tobacco. She reports current alcohol use of about 1.0 standard drink per week. She reports that she does not use drugs. [name] is allergic to gabapentin.   HPI  Today, she is being contacted for a post-procedure assessment.  The patient refers having attained 100% relief of the pain for the duration of the local anesthetic (4 to 6 hours) and after that the pain started to return.  Unfortunately, she did not get any  long-term benefit from the procedure suggesting that what is maintaining this pain is not an inflammatory process and it may be more  of a mechanical issue.  On her 04/17/2021 MRI she has evidence of an L1-2 shallow central and right paracentral disc protrusion with cephalad extension, as well as an L3-4 disc bulging with a superimposed small right foraminal disc protrusion causing borderline right neuroforaminal stenosis.  This is the area that we injected on 11/15/2021 and my impression is that those 2 right-sided disc protrusions may be responsible for the patient's symptoms by causing some degree of nerve impingement affecting the L1 and L3 nerve roots.  The patient indicates that her current pain is in the right lower back and buttocks area going down over the lateral aspect of her right leg, no further than the area above the knee.  Although she refers that this pain was completely gone while the numbing medicine was in place, once it wore off, it returned.  Today we have talked about some alternatives and I have suggested physical therapy with traction, but she indicated that she already had that and it did not help.  I also suggested a neurosurgical referral for evaluation and possible microdiscectomy to remove the L1-2 and L2-3 right-sided protrusions in an effort to relieve this pain.  She was previously seen by Dr. Cari Caraway for a possible revision of her spinal cord stimulator, but he indicated that it did not seem to be necessary to do surgery for that at that time.  However, because she already knows him I will be sending a referral for evaluation on this particular problem to see if there is something they can do to contribute in the treatment of this particular pain.  A more distant option is to do a percutaneous discectomy using the Stryker decompress her disc removal system.  I have explained to the patient that this is something that I could offer her as an alternative.  She indicated being  interested in that as well.  Post-procedure evaluation   Type: Trans-Foraminal Epidural Steroid Injection (Lumbar) #2  Laterality: Left  Level: L1 & L3   Imaging: Fluoroscopic guidance Anesthesia: Local anesthesia (1-2% Lidocaine) Anxiolysis: IV (Versed 2 mg) Sedation: None.  DOS: 11/15/2021  Performed by: Gaspar Cola, MD  Purpose: Diagnostic/Therapeutic Indications: Lower back and lower extremity pain secondary to lumbosacral radiculopathy/radiculitis severe enough to impact quality of life or function. 1. Chronic low back pain (1ry area of Pain) (Bilateral) (R>L) w/ sciatica (Bilateral)   2. Chronic lower extremity pain (2ry area of Pain) (Bilateral) (R>L)   3. Chronic hip pain (3ry area of Pain) (Bilateral) (R>L)   4. DDD (degenerative disc disease), lumbosacral   5. L1-2 disc extrusion (Right)   6. Lumbar facet hypertrophy (Bilateral)   7. 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 Lumbar foraminal stenosis (Bilateral: L4-5) (Right: L3-4)   9. Lumbar radicular pain (Right) (L4)   10. Lumbar radiculopathy   11. Lumbar spondylosis   12. Protrusion of lumbar intervertebral disc (Right: L1-2, L3-4)   13. Presence of neurostimulator (Thoracolumbar)    NAS-11 Pain score:   Pre-procedure: 8 /10   Post-procedure: 0-No pain/10     Effectiveness:  Initial hour after procedure: 100 %. Subsequent 4-6 hours post-procedure: 100 %. Analgesia past initial 6 hours: 0 %. Ongoing improvement:  Analgesic: The patient indicates having attained 100% relief of the pain for the duration of the local anesthetic (4 to 6 hours), after which the pain began to return.  She did not obtain any long-term benefit suggesting that the problem is mechanical rather than inflammatory. Function: Back to  baseline ROM: Back to baseline  Pharmacotherapy Assessment   Opioid Analgesic: Oxycodone IR 5 mg, 1 tab PO QD (5 mg/day of oxycodone) PRN MME/day: 7.5 mg/day.   Monitoring:  PMP:  PDMP reviewed during this encounter.       Pharmacotherapy: No side-effects or adverse reactions reported. Compliance: No problems identified. Effectiveness: Clinically acceptable. Plan: Refer to "POC". UDS:  Summary  Date Value Ref Range Status  04/16/2021 Note  Final    Comment:    ==================================================================== [employer] Select 13 (MW) ==================================================================== Test                             Result       Flag       Units  Drug Absent but Declared for Prescription Verification   Oxycodone                      Not Detected UNEXPECTED ng/mg creat ==================================================================== Test                      Result    Flag   Units      Ref Range   Creatinine              41               mg/dL      >=20 ==================================================================== Declared Medications:  The flagging and interpretation on this report are based on the  following declared medications.  Unexpected results may arise from  inaccuracies in the declared medications.   **Note: The testing scope of this panel includes these medications:   Oxycodone (Roxicodone)   **Note: The testing scope of this panel does not include the  following reported medications:   Acetaminophen (Tylenol)  Biotin  Buspirone (Buspar)  Fish Oil  Fluoxetine (Prozac)  Gabapentin (Neurontin)  Ondansetron (Zofran)  Scopolamine  Tizanidine  Trazodone (Desyrel)  Turmeric ==================================================================== For clinical consultation, please call [phone]. ====================================================================      Laboratory Chemistry Profile   Renal Lab Results  Component Value Date   BUN 12 02/01/2021   CREATININE 0.85 02/01/2021   BCR 8 (L) 09/08/2017   GFR 77.34 02/01/2021   GFRAA 93 09/08/2017   GFRNONAA 81 09/08/2017     Hepatic Lab Results  Component Value Date   AST 15 02/01/2021   ALT 11 02/01/2021   ALBUMIN 4.4 02/01/2021   ALKPHOS 59 02/01/2021    Electrolytes Lab Results  Component Value Date   NA 139 02/01/2021   K 4.2 02/01/2021   CL 105 02/01/2021   CALCIUM 9.3 02/01/2021   MG 2.1 09/08/2017    Bone Lab Results  Component Value Date   VD25OH 43.74 01/10/2016   25OHVITD1 51 09/08/2017   25OHVITD2 <1.0 09/08/2017   25OHVITD3 51 09/08/2017    Inflammation (CRP: Acute Phase) (ESR: Chronic Phase) Lab Results  Component Value Date   CRP 1.4 09/08/2017   ESRSEDRATE 11 09/08/2017         Note: Above Lab results reviewed.  Imaging  DG PAIN CLINIC C-ARM 1-60 MIN NO REPORT Fluoro was used, but no Radiologist interpretation will be provided.  Please refer to "NOTES" tab for provider progress note.  Assessment  The primary encounter diagnosis was Chronic low back pain (1ry area of Pain) (Bilateral) (R>L) w/ sciatica (Bilateral). Diagnoses of L1-2 disc extrusion (Right), Protrusion of lumbar intervertebral disc (Right: L1-2, L3-4), Chronic  lower extremity pain (2ry area of Pain) (Bilateral) (R>L), Chronic hip pain (3ry area of Pain) (Bilateral) (R>L), Abnormal MRI, lumbar spine (04/17/2021), and Presence of neurostimulator (Thoracolumbar) were also pertinent to this visit.  Plan of Care  Problem-specific:  No problem-specific Assessment & Plan notes found for this encounter.  Ms. ORILLA TEMPLEMAN has a current medication list which includes the following long-term medication(s): fluoxetine, linaclotide, omeprazole, oxycodone, [START ON 12/15/2021] oxycodone, [START ON 01/14/2022] oxycodone, pregabalin, and trazodone.  Pharmacotherapy (Medications Ordered): No orders of the defined types were placed in this encounter.  Orders:  Orders Placed This Encounter  Procedures   Misc procedure    Standing Status:   Future    Standing Expiration Date:   02/26/2022    Scheduling Instructions:      Type: L1-2 and L3-4 percutaneous discectomy     Side: Right-sided     Sedation: With Sedation.     Timeframe: As soon as approved.   Ambulatory referral to Neurosurgery    Referral Priority:   Routine    Referral Type:   Surgical    Referral Reason:   Specialty Services Required    Requested Specialty:   Neurosurgery    Number of Visits Requested:   1   Follow-up plan:   Return for (75mn), (ECT) procedure: (R) L1-2 & L3-4 Percutaneous Discectomy.     Interventional Therapies  Risk   Complexity Considerations:   Estimated body mass index is 27.07 kg/m as calculated from the following:   Height as of this encounter: _0  (1.575 m).   Weight as of this encounter: 148 lb (67.1 kg). WNL   Planned   Pending:      Under consideration:   Therapeutic right L1-2 and L3-4 percutaneous discectomy using the Stryker the compressor disc removal system.   Completed:   Therapeutic right cervical ESI x1 (08/24/2020) (100/100/50/50)  Diagnostic bilateral GONB x1 (08/19/2019) (100/100/100/,50)  Palliative right cervical facet MBB x3 (07/23/2018) (100/100/100/>50)  Palliative left cervical facet MBB x4 (04/01/2019) (90/90/0/0)  Palliative right cervical facet RFA x1 (09/17/2018) (100/100/85/85)  Palliative left cervical facet RFA x1 (05/04/2019) (100/100/50/50)  Therapeutic left L1-2 LESI x1 (05/10/2021) (100/100/100 x 2 days/0)  Therapeutic right L1-2 LESI x2 (02/10/2018) (100/100/70/50-75)  Therapeutic right L1 TFESI x1 (02/10/2018) (100/100/70/50-75)  Diagnostic/therapeutic left superior cluneal NB (L2, L3 dorsal rami) x1 (04/24/2021) (0/0/0/0)  Diagnostic/therapeutic left L1 TFESI x2 (11/15/2021) (100/100/0)  Diagnostic/therapeutic left L3 TFESI x2 (11/15/2021) (100/100/0)  Bilateral lumbar spinal cord stimulator trial (done) (05/14/2018) (by me)  Bilateral lumbar spinal cord stimulator implant (09/28/2019) (by me)  Bilateral spinal cord stimulator revision (03/14/2020) (by me)  Diagnostic bilateral  lumbar facet MBB x3 (11/30/2019) (100/100/50/20)  Diagnostic bilateral SI joint Blk x2 (09/18/2021) (100/100/100)  Palliative left lumbar facet RFA x2 (03/27/2021) (100/100/0)  Therapeutic right lumbar facet RFA x2 (02/01/2021) (100/100/0/0)    Therapeutic   Palliative (PRN) options:   Palliative right cervical facet RFA   Diagnostic bilateral SI joint Blk      Recent Visits Date Type Provider Dept  11/15/21 Procedure visit NMilinda Pointer MD [facility]  10/30/21 Office Visit NMilinda Pointer MD [facility]  10/04/21 Office Visit NMilinda Pointer MD [facility]  09/18/21 Procedure visit NMilinda Pointer MD [facility]  Showing recent visits within past 90 days and meeting all other requirements Today's Visits Date Type Provider Dept  11/29/21 Office Visit NMilinda Pointer MD [facility]  Showing today's visits and meeting all other  requirements Future Appointments Date Type Provider Dept  02/06/22 Appointment Milinda Pointer, MD [facility]  Showing future appointments within next 90 days and meeting all other requirements  I discussed the assessment and treatment plan with the patient. The patient was provided an opportunity to ask questions and all were answered. The patient agreed with the plan and demonstrated an understanding of the instructions.  Patient advised to call back or seek an in-person evaluation if the symptoms or condition worsens.  Duration of encounter: 22 minutes.  Note by: Gaspar Cola, MD Date: 11/29/2021; Time: 1:05 PM

## 2021-11-29 NOTE — Telephone Encounter (Signed)
My chart message sent to pt.

## 2021-11-29 NOTE — Telephone Encounter (Signed)
Pt's mychart reply: "I take 2 every night"

## 2021-11-30 NOTE — Patient Instructions (Signed)
______________________________________________________________________  Preparing for Procedure with Sedation  NOTICE: Due to recent regulatory changes, starting on May 28, 2021, procedures requiring intravenous (IV) sedation will no longer be performed at the [facility].  These types of procedures are required to be performed at [facility] ambulatory surgery facility.  We are very sorry for the inconvenience.  Procedure appointments are limited to planned procedures: No Prescription Refills. No disability issues will be discussed. No medication changes will be discussed.  Instructions: Oral Intake: Do not eat or drink anything for at least 8 hours prior to your procedure. (Exception: Blood Pressure Medication. See below.) Transportation: A driver is required. You may not drive yourself after the procedure. Blood Pressure Medicine: Do not forget to take your blood pressure medicine with a sip of water the morning of the procedure. If your Diastolic (lower reading) is above 100 mmHg, elective cases will be cancelled/rescheduled. Blood thinners: These will need to be stopped for procedures. Notify our staff if you are taking any blood thinners. Depending on which one you take, there will be specific instructions on how and when to stop it. Diabetics on insulin: Notify the staff so that you can be scheduled 1st case in the morning. If your diabetes requires high dose insulin, take only  of your normal insulin dose the morning of the procedure and notify the staff that you have done so. Preventing infections: Shower with an antibacterial soap the morning of your procedure. Build-up your immune system: Take 1000 mg of Vitamin C with every meal (3 times a day) the day prior to your procedure. Antibiotics: Inform the staff if you have a condition or reason that requires you to take antibiotics before dental procedures. Pregnancy: If you are pregnant, call and cancel the procedure. Sickness: If  you have a cold, fever, or any active infections, call and cancel the procedure. Arrival: You must be in the facility at least 30 minutes prior to your scheduled procedure. Children: Do not bring children with you. Dress appropriately: Bring dark clothing that you would not mind if they get stained. Valuables: Do not bring any jewelry or valuables.  Reasons to call and reschedule or cancel your procedure: (Following these recommendations will minimize the risk of a serious complication.) Surgeries: Avoid having procedures within 2 weeks of any surgery. (Avoid for 2 weeks before or after any surgery). Flu Shots: Avoid having procedures within 2 weeks of a flu shots. (Avoid for 2 weeks before or after immunizations). Barium: Avoid having a procedure within 7-10 days after having had a radiological study involving the use of radiological contrast. (Myelograms, Barium swallow or enema study). Heart attacks: Avoid any elective procedures or surgeries for the initial 6 months after a "Myocardial Infarction" (Heart Attack). Blood thinners: It is imperative that you stop these medications before procedures. Let us know if you if you take any blood thinner.  Infection: Avoid procedures during or within two weeks of an infection (including chest colds or gastrointestinal problems). Symptoms associated with infections include: Localized redness, fever, chills, night sweats or profuse sweating, burning sensation when voiding, cough, congestion, stuffiness, runny nose, sore throat, diarrhea, nausea, vomiting, cold or Flu symptoms, recent or current infections. It is specially important if the infection is over the area that we intend to treat. Heart and lung problems: Symptoms that may suggest an active cardiopulmonary problem include: cough, chest pain, breathing difficulties or shortness of breath, dizziness, ankle swelling, uncontrolled high or unusually low blood pressure, and/or palpitations. If you are    experiencing any of these symptoms, cancel your procedure and contact your primary care physician for an evaluation.  Remember:  Regular Business hours are:  Monday to Thursday 8:00 AM to 4:00 PM  Provider's Schedule: Francisco Naveira, MD:  Procedure days: Tuesday and Thursday 7:30 AM to 4:00 PM  Bilal Lateef, MD:  Procedure days: Monday and Wednesday 7:30 AM to 4:00 PM ______________________________________________________________________   

## 2021-12-08 ENCOUNTER — Other Ambulatory Visit: Payer: Self-pay | Admitting: Primary Care

## 2021-12-08 DIAGNOSIS — G4700 Insomnia, unspecified: Secondary | ICD-10-CM

## 2021-12-09 NOTE — Telephone Encounter (Signed)
Please call patient:  During our video visit in November we discussed to reduce her Trazodone to 1 or 2 tabs. Did she do this or is she taking 3 tabs HS?  Also, needs in person CPE in June.

## 2021-12-10 NOTE — Telephone Encounter (Signed)
Patient is not able to reduce. She has trouble getting to sleep with 3 tabs at night. CPE made

## 2021-12-18 ENCOUNTER — Other Ambulatory Visit: Payer: Self-pay | Admitting: Neurosurgery

## 2021-12-18 ENCOUNTER — Ambulatory Visit
Admission: RE | Admit: 2021-12-18 | Discharge: 2021-12-18 | Disposition: A | Discharge status: Home / Self Care | Payer: Medicare PPO | Source: Ambulatory Visit | Attending: Neurosurgery | Admitting: Neurosurgery

## 2021-12-18 DIAGNOSIS — G8929 Other chronic pain: Secondary | ICD-10-CM

## 2021-12-18 DIAGNOSIS — M545 Low back pain, unspecified: Secondary | ICD-10-CM

## 2021-12-18 DIAGNOSIS — M4312 Spondylolisthesis, cervical region: Secondary | ICD-10-CM | POA: Diagnosis not present

## 2021-12-18 DIAGNOSIS — G894 Chronic pain syndrome: Secondary | ICD-10-CM | POA: Insufficient documentation

## 2021-12-18 DIAGNOSIS — M47816 Spondylosis without myelopathy or radiculopathy, lumbar region: Secondary | ICD-10-CM | POA: Diagnosis not present

## 2021-12-18 DIAGNOSIS — M4322 Fusion of spine, cervical region: Secondary | ICD-10-CM | POA: Diagnosis not present

## 2021-12-18 DIAGNOSIS — Z981 Arthrodesis status: Secondary | ICD-10-CM | POA: Diagnosis not present

## 2021-12-18 DIAGNOSIS — M5136 Other intervertebral disc degeneration, lumbar region: Secondary | ICD-10-CM | POA: Diagnosis not present

## 2021-12-18 DIAGNOSIS — M4802 Spinal stenosis, cervical region: Secondary | ICD-10-CM | POA: Diagnosis not present

## 2021-12-25 ENCOUNTER — Other Ambulatory Visit: Payer: Self-pay | Admitting: Neurosurgery

## 2021-12-25 DIAGNOSIS — G894 Chronic pain syndrome: Secondary | ICD-10-CM

## 2021-12-25 DIAGNOSIS — M545 Low back pain, unspecified: Secondary | ICD-10-CM

## 2021-12-25 DIAGNOSIS — G8929 Other chronic pain: Secondary | ICD-10-CM

## 2022-01-02 ENCOUNTER — Other Ambulatory Visit: Payer: Self-pay | Admitting: Neurosurgery

## 2022-01-02 DIAGNOSIS — G894 Chronic pain syndrome: Secondary | ICD-10-CM

## 2022-01-02 DIAGNOSIS — G8929 Other chronic pain: Secondary | ICD-10-CM

## 2022-01-02 DIAGNOSIS — M545 Low back pain, unspecified: Secondary | ICD-10-CM

## 2022-01-04 ENCOUNTER — Ambulatory Visit (HOSPITAL_COMMUNITY)
Admission: RE | Admit: 2022-01-04 | Discharge: 2022-01-04 | Disposition: A | Discharge status: Home / Self Care | Payer: Medicare PPO | Source: Ambulatory Visit | Attending: Neurosurgery | Admitting: Neurosurgery

## 2022-01-04 ENCOUNTER — Other Ambulatory Visit: Payer: Self-pay

## 2022-01-04 ENCOUNTER — Ambulatory Visit: Payer: Medicare PPO

## 2022-01-04 DIAGNOSIS — G894 Chronic pain syndrome: Secondary | ICD-10-CM

## 2022-01-04 DIAGNOSIS — G8929 Other chronic pain: Secondary | ICD-10-CM | POA: Diagnosis not present

## 2022-01-04 DIAGNOSIS — M4726 Other spondylosis with radiculopathy, lumbar region: Secondary | ICD-10-CM | POA: Diagnosis not present

## 2022-01-04 DIAGNOSIS — M545 Low back pain, unspecified: Secondary | ICD-10-CM | POA: Insufficient documentation

## 2022-01-04 DIAGNOSIS — M5116 Intervertebral disc disorders with radiculopathy, lumbar region: Secondary | ICD-10-CM | POA: Diagnosis not present

## 2022-01-31 ENCOUNTER — Ambulatory Visit: Payer: Medicare PPO | Admitting: Gastroenterology

## 2022-02-05 NOTE — Progress Notes (Signed)
PROVIDER NOTE: Information contained herein reflects review and annotations entered in association with encounter. Interpretation of such information and data should be left to medically-trained personnel. Information provided to patient can be located elsewhere in the medical record under "Patient Instructions". Document created using STT-dictation technology, any transcriptional errors that may result from process are unintentional.  ?  ?Patient: Karen Edwards  Service Category: E/M  Provider: Gaspar Cola, MD  ?DOB: 09-Jul-1966  DOS: 02/06/2022  Specialty: Interventional Pain Management  ?MRN: [id number]  Setting: Ambulatory outpatient  PCP: Pleas Koch, NP  ?Type: Established Patient    Referring Provider: Pleas Koch, NP  ?Location: Office  Delivery: Face-to-face    ? ?HPI  ?Karen Edwards, a 56 y.o. year old female, is here today because of her Chronic pain syndrome [G89.4]. Karen Edwards primary complain today is Back Pain (lower) ?Last encounter: My last encounter with her was on 11/29/2021. ?Pertinent problems: Karen Edwards has Chronic low back pain (1ry area of Pain) (Bilateral) (R>L) w/ sciatica (Bilateral); DDD (degenerative disc disease), lumbar; DDD (degenerative disc disease), cervical; Chronic occipital neuralgia (5th area of Pain) (Bilateral) (R>L); Lumbar facet syndrome (Bilateral) (L>R); DDD (degenerative disc disease), lumbosacral; Lumbar radicular pain (Right) (L4); Sacroiliac joint dysfunction (Bilateral); Chronic neck pain (4th area of Pain) (Bilateral) (R>L); Numbness and tingling of upper extremity (Right); Chronic upper extremity pain (Bilateral) (R>L); Chronic sacroiliac joint pain (Bilateral) (L>R); Lumbar facet hypertrophy (Bilateral); L1-2 disc extrusion (Right); Cervical foraminal stenosis (C5-6) (Bilateral); Chronic pain syndrome; Chronic cervical radicular pain (Right: C6/C7) (Left: C5/T1); Chronic lumbar radicular pain; Chronic hip pain (3ry area of Pain)  (Bilateral) (R>L); Chronic lower extremity pain (2ry area of Pain) (Bilateral) (R>L); Chronic shoulder pain (Bilateral) (L>R); Chronic musculoskeletal pain; Lumbar spondylosis; Cervical facet syndrome (Bilateral) (R>L); Myofascial pain; Spondylosis without myelopathy or radiculopathy, cervical region; Cervicalgia; S/P insertion of Epidural Neurostimulator (SCS) (Bilateral); Chronic neck pain with history of cervical spinal surgery; 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[facility]); Trigger point with back pain (Right); Cervicogenic headache (Bilateral); Occipital headache (Bilateral); Chronic tension-type headache, intractable; Cervico-occipital neuralgia (Bilateral); Pain due to any device, implant or graft (SCS battery) (Left PSIS area); Spondylosis without myelopathy or radiculopathy, lumbosacral region; History of cervical spinal surgery (C5-7 ACDF); Cervical radiculopathy at C6 (Bilateral); Chronic low back pain (Bilateral) w/o sciatica; Presence of neurostimulator (Thoracolumbar); 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 ?Pain Assessment: Severity of Chronic pain is reported as a 7 /10. Location: Back Right, Left/pain radiaties down both leg to her thigh. Onset: More than a month ago. Quality: Constant, Burning, Aching, Stabbing, Throbbing, Nagging. Timing: Constant. Modifying factor(s): meds and repositing. ?Vitals:  height is '5\' 1"'$  (1.549 m) and weight is 145 lb (65.8 kg). Her temperature is 97.1 ?F (36.2 ?C) (abnormal). Her blood pressure is 131/76 and her pulse is 78. Her oxygen saturation is 100%.  ? ?Reason for  encounter: medication management.   The patient indicates doing well with the current medication regimen. No adverse reactions or side effects reported to the medications.  ? ?Today the patient returns to the clinics indicating that there is she was evaluated by Dr. Elayne Guerin and he indicated to her that there was not much that  he could see that he could do for her other than recommending that she consider an intrathecal pump.  She comes into the clinic today to talk about this alternative.  Today I spent quite some time with her going over the device and the advantages as well as the limitations.  We talked about the indications and the possible complications.  We also addressed the issue of realistic expectations regarding her low back pain.  She has decided to hold on the intrathecal pump for now.  However, I have anyway provided her with some written information regarding the device so that she can go over it and return with more questions if she is interested. ? ?Today she comes in indicating that she is having a flareup of her low back pain.  She indicates that her primary area of pain is that of the lower back (Bilateral) (L>R) she is having some lower extremity pain primarily on the left leg, but this pain runs through the back of the thigh and does not go any further than the knee.  Further evaluation has revealed that this is referred pain from her lower back facet joints. ? ?(01/04/2022) lumbar MRI: ?IMPRESSION: ?Noncompressive lumbar spine degeneration as described. No change since June 2022. ? ?Review of the MRI shows a small central disc protrusion at the L1-2 level; disc bulging with right inferior foraminal protrusion at the L2-3 level; disc bulging with small right inferior foraminal protrusion and asymmetric right facet spurring at the L3-4 level; mild disc bulging and facet spurring at the L4-5 level; and mild facet spurring at the L5-S1 level with no evidence of neural compression.  It  also shows a spinal cord stimulator hardware with no obvious problems associated with it. ? ?Physical exam today was negative for straight leg raise although she did experience increased low back pain bilaterally, with the maneuver.  She also indicated having some discomfort going down the back of the leg, but it did not make it to the knees.  Again suggesting that this lower extremity discomfort is referred from the lower back structures.  Toe walk and heel walk were both completely within normal limits with no deficits.  Hyperextension rotation of the lumbar spine as well as Lynelle Smoke maneuver was positive bilaterally for lumbar facet arthralgia with reproduction of the patient's low back pain.  Patrick maneuver was positive bilaterally for both hip joint and sacroiliac joint pain with the sacroiliac joint pain predominating. ? ?Based on the above results, we have decided to schedule the patient for a therapeutic bilateral lower Lumbar facet and sacroiliac joint block under fluoroscopic guidance.  The plan and the findings have been shared with the patient who understood and accepted.  We will first do the therapeutic/diagnostic blocks and if the patient gets good benefit, we will move onto radiofrequency ablation. ? ?RTCB: 05/14/2022 ?Nonopioids transfer 08/23/2020: Lidocaine 5% ointment and Robaxin ? ?Pharmacotherapy Assessment  ?Analgesic: Oxycodone IR 5 mg, 1 tab PO QD (5 mg/day of oxycodone) PRN ?MME/day: 7.5 mg/day.  ? ?Monitoring: ? PMP: PDMP reviewed during this encounter.       ?Pharmacotherapy: No side-effects or adverse reactions reported. ?Compliance: No problems identified. ?Effectiveness: Clinically acceptable. ? ?Chauncey Fischer, RN  02/06/2022  8:56 AM  Sign when Signing Visit ?Nursing Pain Medication Assessment:  ?Safety precautions to be maintained throughout the outpatient stay will include: orient to surroundings, keep bed in low position, maintain call bell within reach at all times, provide  assistance with transfer out of  bed and ambulation.  ?Medication Inspection Compliance: Pill count conducted under aseptic conditions, in front of the patient. Neither the pills nor the bottle was removed from the pat

## 2022-02-06 ENCOUNTER — Ambulatory Visit: Payer: Medicare PPO | Attending: Pain Medicine | Admitting: Pain Medicine

## 2022-02-06 ENCOUNTER — Encounter: Payer: Self-pay | Admitting: Pain Medicine

## 2022-02-06 VITALS — BP 131/76 | HR 78 | Temp 97.1°F | Ht 61.0 in | Wt 145.0 lb

## 2022-02-06 DIAGNOSIS — Z79899 Other long term (current) drug therapy: Secondary | ICD-10-CM | POA: Insufficient documentation

## 2022-02-06 DIAGNOSIS — M25552 Pain in left hip: Secondary | ICD-10-CM | POA: Diagnosis present

## 2022-02-06 DIAGNOSIS — M5442 Lumbago with sciatica, left side: Secondary | ICD-10-CM | POA: Insufficient documentation

## 2022-02-06 DIAGNOSIS — M25551 Pain in right hip: Secondary | ICD-10-CM | POA: Diagnosis not present

## 2022-02-06 DIAGNOSIS — M79605 Pain in left leg: Secondary | ICD-10-CM | POA: Diagnosis not present

## 2022-02-06 DIAGNOSIS — M5481 Occipital neuralgia: Secondary | ICD-10-CM | POA: Diagnosis not present

## 2022-02-06 DIAGNOSIS — G8929 Other chronic pain: Secondary | ICD-10-CM | POA: Diagnosis not present

## 2022-02-06 DIAGNOSIS — M5441 Lumbago with sciatica, right side: Secondary | ICD-10-CM | POA: Insufficient documentation

## 2022-02-06 DIAGNOSIS — M79604 Pain in right leg: Secondary | ICD-10-CM | POA: Insufficient documentation

## 2022-02-06 DIAGNOSIS — M47816 Spondylosis without myelopathy or radiculopathy, lumbar region: Secondary | ICD-10-CM | POA: Diagnosis not present

## 2022-02-06 DIAGNOSIS — G894 Chronic pain syndrome: Secondary | ICD-10-CM | POA: Diagnosis not present

## 2022-02-06 DIAGNOSIS — M533 Sacrococcygeal disorders, not elsewhere classified: Secondary | ICD-10-CM | POA: Diagnosis present

## 2022-02-06 DIAGNOSIS — M542 Cervicalgia: Secondary | ICD-10-CM | POA: Insufficient documentation

## 2022-02-06 DIAGNOSIS — Z79891 Long term (current) use of opiate analgesic: Secondary | ICD-10-CM | POA: Diagnosis not present

## 2022-02-06 MED ORDER — OXYCODONE HCL 5 MG PO TABS: 5.0000 mg | ORAL_TABLET | Freq: Two times a day (BID) | ORAL | 0 refills | Status: DC | PRN

## 2022-02-06 NOTE — Patient Instructions (Addendum)
______________________________________________________________________ ? ?Preparing for Procedure with Sedation ? ?NOTICE: Due to recent regulatory changes, starting on May 28, 2021, procedures requiring intravenous (IV) sedation will no longer be performed at the [facility].  These types of procedures are required to be performed at [facility] ambulatory surgery facility.  We are very sorry for the inconvenience. ? ?Procedure appointments are limited to planned procedures: ?No Prescription Refills. ?No disability issues will be discussed. ?No medication changes will be discussed. ? ?Instructions: ?Oral Intake: Do not eat or drink anything for at least 8 hours prior to your procedure. (Exception: Blood Pressure Medication. See below.) ?Transportation: A driver is required. You may not drive yourself after the procedure. ?Blood Pressure Medicine: Do not forget to take your blood pressure medicine with a sip of water the morning of the procedure. If your Diastolic (lower reading) is above 100 mmHg, elective cases will be cancelled/rescheduled. ?Blood thinners: These will need to be stopped for procedures. Notify our staff if you are taking any blood thinners. Depending on which one you take, there will be specific instructions on how and when to stop it. ?Diabetics on insulin: Notify the staff so that you can be scheduled 1st case in the morning. If your diabetes requires high dose insulin, take only ? of your normal insulin dose the morning of the procedure and notify the staff that you have done so. ?Preventing infections: Shower with an antibacterial soap the morning of your procedure. ?Build-up your immune system: Take 1000 mg of Vitamin C with every meal (3 times a day) the day prior to your procedure. ?Antibiotics: Inform the staff if you have a condition or reason that requires you to take antibiotics before dental procedures. ?Pregnancy: If you are pregnant, call and cancel the procedure. ?Sickness: If  you have a cold, fever, or any active infections, call and cancel the procedure. ?Arrival: You must be in the facility at least 30 minutes prior to your scheduled procedure. ?Children: Do not bring children with you. ?Dress appropriately: Bring dark clothing that you would not mind if they get stained. ?Valuables: Do not bring any jewelry or valuables. ? ?Reasons to call and reschedule or cancel your procedure: (Following these recommendations will minimize the risk of a serious complication.) ?Surgeries: Avoid having procedures within 2 weeks of any surgery. (Avoid for 2 weeks before or after any surgery). ?Flu Shots: Avoid having procedures within 2 weeks of a flu shots. (Avoid for 2 weeks before or after immunizations). ?Barium: Avoid having a procedure within 7-10 days after having had a radiological study involving the use of radiological contrast. (Myelograms, Barium swallow or enema study). ?Heart attacks: Avoid any elective procedures or surgeries for the initial 6 months after a "Myocardial Infarction" (Heart Attack). ?Blood thinners: It is imperative that you stop these medications before procedures. Let us know if you if you take any blood thinner.  ?Infection: Avoid procedures during or within two weeks of an infection (including chest colds or gastrointestinal problems). Symptoms associated with infections include: Localized redness, fever, chills, night sweats or profuse sweating, burning sensation when voiding, cough, congestion, stuffiness, runny nose, sore throat, diarrhea, nausea, vomiting, cold or Flu symptoms, recent or current infections. It is specially important if the infection is over the area that we intend to treat. ?Heart and lung problems: Symptoms that may suggest an active cardiopulmonary problem include: cough, chest pain, breathing difficulties or shortness of breath, dizziness, ankle swelling, uncontrolled high or unusually low blood pressure, and/or palpitations. If you are  experiencing any of these symptoms, cancel your procedure and contact your primary care physician for an evaluation. ? ?Remember:  ?Regular Business hours are:  ?Monday to Thursday 8:00 AM to 4:00 PM ? ?Provider's Schedule: ?Milinda Pointer, MD:  ?Procedure days: Tuesday and Thursday 7:30 AM to 4:00 PM ? ?Gillis Santa, MD:  ?Procedure days: Monday and Wednesday 7:30 AM to 4:00 PM ?______________________________________________________________________ ?  ?____________________________________________________________________________________________ ? ?General Risks and Possible Complications ? ?Patient Responsibilities: It is important that you read this as it is part of your informed consent. It is our duty to inform you of the risks and possible complications associated with treatments offered to you. It is your responsibility as a patient to read this and to ask questions about anything that is not clear or that you believe was not covered in this document. ? ?Patient?s Rights: You have the right to refuse treatment. You also have the right to change your mind, even after initially having agreed to have the treatment done. However, under this last option, if you wait until the last second to change your mind, you may be charged for the materials used up to that point. ? ?Introduction: Medicine is not an Chief Strategy Officer. Everything in Medicine, including the lack of treatment(s), carries the potential for danger, harm, or loss (which is by definition: Risk). In Medicine, a complication is a secondary problem, condition, or disease that can aggravate an already existing one. All treatments carry the risk of possible complications. The fact that a side effects or complications occurs, does not imply that the treatment was conducted incorrectly. It must be clearly understood that these can happen even when everything is done following the highest safety standards. ? ?No treatment: You can choose not to proceed with the  proposed treatment alternative. The ?PRO(s)? would include: avoiding the risk of complications associated with the therapy. The ?CON(s)? would include: not getting any of the treatment benefits. These benefits fall under one of three categories: diagnostic; therapeutic; and/or palliative. Diagnostic benefits include: getting information which can ultimately lead to improvement of the disease or symptom(s). Therapeutic benefits are those associated with the successful treatment of the disease. Finally, palliative benefits are those related to the decrease of the primary symptoms, without necessarily curing the condition (example: decreasing the pain from a flare-up of a chronic condition, such as incurable terminal cancer). ? ?General Risks and Complications: These are associated to most interventional treatments. They can occur alone, or in combination. They fall under one of the following six (6) categories: no benefit or worsening of symptoms; bleeding; infection; nerve damage; allergic reactions; and/or death. ?No benefits or worsening of symptoms: In Medicine there are no guarantees, only probabilities. No healthcare provider can ever guarantee that a medical treatment will work, they can only state the probability that it may. Furthermore, there is always the possibility that the condition may worsen, either directly, or indirectly, as a consequence of the treatment. ?Bleeding: This is more common if the patient is taking a blood thinner, either prescription or over the counter (example: Goody Powders, Fish oil, Aspirin, Garlic, etc.), or if suffering a condition associated with impaired coagulation (example: Hemophilia, cirrhosis of the liver, low platelet counts, etc.). However, even if you do not have one on these, it can still happen. If you have any of these conditions, or take one of these drugs, make sure to notify your treating physician. ?Infection: This is more common in patients with a compromised  immune system, either due to disease (  example: diabetes, cancer, human immunodeficiency virus [HIV], etc.), or due to medications or treatments (example: therapies used to treat cancer and rheumatological dise

## 2022-02-06 NOTE — Progress Notes (Signed)
Nursing Pain Medication Assessment:  ?Safety precautions to be maintained throughout the outpatient stay will include: orient to surroundings, keep bed in low position, maintain call bell within reach at all times, provide assistance with transfer out of bed and ambulation.  ?Medication Inspection Compliance: Pill count conducted under aseptic conditions, in front of the patient. Neither the pills nor the bottle was removed from the patient's sight at any time. Once count was completed pills were immediately returned to the patient in their original bottle. ? ?Medication: Oxycodone IR ?Pill/Patch Count:  14 of 40 pills remain ?Pill/Patch Appearance: Markings consistent with prescribed medication ?Bottle Appearance: Standard pharmacy container. Clearly labeled. ?Filled Date: 1 / 51 / 2023 ?Last Medication intake:  TodaySafety precautions to be maintained throughout the outpatient stay will include: orient to surroundings, keep bed in low position, maintain call bell within reach at all times, provide assistance with transfer out of bed and ambulation.  ?

## 2022-02-07 ENCOUNTER — Telehealth: Payer: Self-pay

## 2022-02-07 NOTE — Telephone Encounter (Signed)
Insurance Treatment Denial Note  ?Date order was entered:  ?Order entered by: Milinda Pointer, MD ?Requested treatment: [facility]I ?Reason for denial: Documentation of pertinent physical exam is missing. ?Recommended for approval:  see below  ?  ?They ask for physical tests, Positive SI distraction ?Positive SI compression, Positive thigh thrust, Postive Gaenslens, Positive sacral thrust, positive Faber. ?Usually 3 positives out of these are sufficient for authorization.  ?

## 2022-02-12 ENCOUNTER — Other Ambulatory Visit: Payer: Self-pay

## 2022-02-12 ENCOUNTER — Encounter: Payer: Self-pay | Admitting: Gastroenterology

## 2022-02-12 ENCOUNTER — Ambulatory Visit (INDEPENDENT_AMBULATORY_CARE_PROVIDER_SITE_OTHER): Payer: Medicare PPO | Admitting: Gastroenterology

## 2022-02-12 VITALS — BP 116/79 | HR 105 | Temp 98.4°F | Ht 61.0 in | Wt 155.4 lb

## 2022-02-12 DIAGNOSIS — T402X5A Adverse effect of other opioids, initial encounter: Secondary | ICD-10-CM

## 2022-02-12 DIAGNOSIS — K5903 Drug induced constipation: Secondary | ICD-10-CM | POA: Diagnosis not present

## 2022-02-12 NOTE — Patient Instructions (Signed)
Miralax prep. Mix 32 ounces of Gatorade with 238 grams of miralax. Drink 8oz every 20 minutes till solution is gone.  ?Gave [facility] samples please let me know if this helps and we can call you in a prescription.  ? ?High-Fiber Eating Plan ?Fiber, also called dietary fiber, is a type of carbohydrate. It is found foods such as fruits, vegetables, whole grains, and beans. A high-fiber diet can have many health benefits. Your health care provider may recommend a high-fiber diet to help: ?Prevent constipation. Fiber can make your bowel movements more regular. ?Lower your cholesterol. ?Relieve the following conditions: ?Inflammation of veins in the anus (hemorrhoids). ?Inflammation of specific areas of the digestive tract (uncomplicated diverticulosis). ?A problem of the large intestine, also called the colon, that sometimes causes pain and diarrhea (irritable bowel syndrome, or IBS). ?Prevent overeating as part of a weight-loss plan. ?Prevent heart disease, type 2 diabetes, and certain cancers. ?What are tips for following this plan? ?Reading food labels ? ?Check the nutrition facts label on food products for the amount of dietary fiber. Choose foods that have 5 grams of fiber or more per serving. ?The goals for recommended daily fiber intake include: ?Men (age 48 or younger): 34-38 g. ?Men (over age 81): 28-34 g. ?Women (age 14 or younger): 25-28 g. ?Women (over age 79): 22-25 g. ?Your daily fiber goal is _____________ g. ?Shopping ?Choose whole fruits and vegetables instead of processed forms, such as apple juice or applesauce. ?Choose a wide variety of high-fiber foods such as avocados, lentils, oats, and kidney beans. ?Read the nutrition facts label of the foods you choose. Be aware of foods with added fiber. These foods often have high sugar and sodium amounts per serving. ?Cooking ?Use whole-grain flour for baking and cooking. ?Cook with brown rice instead of white rice. ?Meal planning ?Start the day with a  breakfast that is high in fiber, such as a cereal that contains 5 g of fiber or more per serving. ?Eat breads and cereals that are made with whole-grain flour instead of refined flour or white flour. ?Eat brown rice, bulgur wheat, or millet instead of white rice. ?Use beans in place of meat in soups, salads, and pasta dishes. ?Be sure that half of the grains you eat each day are whole grains. ?General information ?You can get the recommended daily intake of dietary fiber by: ?Eating a variety of fruits, vegetables, grains, nuts, and beans. ?Taking a fiber supplement if you are not able to take in enough fiber in your diet. It is better to get fiber through food than from a supplement. ?Gradually increase how much fiber you consume. If you increase your intake of dietary fiber too quickly, you may have bloating, cramping, or gas. ?Drink plenty of water to help you digest fiber. ?Choose high-fiber snacks, such as berries, raw vegetables, nuts, and popcorn. ?What foods should I eat? ?Fruits ?Berries. Pears. Apples. Oranges. Avocado. Prunes and raisins. Dried figs. ?Vegetables ?Sweet potatoes. Spinach. Kale. Artichokes. Cabbage. Broccoli. Cauliflower. Green peas. Carrots. Squash. ?Grains ?Whole-grain breads. Multigrain cereal. Oats and oatmeal. Brown rice. Barley. Bulgur wheat. [place]. Quinoa. Bran muffins. Popcorn. Rye wafer crackers. ?Meats and other proteins ?Navy beans, kidney beans, and pinto beans. Soybeans. Split peas. Lentils. Nuts and seeds. ?Dairy ?Fiber-fortified yogurt. ?Beverages ?Fiber-fortified soy milk. Fiber-fortified orange juice. ?Other foods ?Fiber bars. ?The items listed above may not be a complete list of recommended foods and beverages. Contact a dietitian for more information. ?What foods should I avoid? ?Fruits ?Fruit juice.  Cooked, strained fruit. ?Vegetables ?Fried potatoes. Canned vegetables. Well-cooked vegetables. ?Grains ?White bread. Pasta made with refined flour. White rice. ?Meats and  other proteins ?Fatty cuts of meat. Fried chicken or fried fish. ?Dairy ?Milk. Yogurt. Cream cheese. Sour cream. ?Fats and oils ?Butters. ?Beverages ?Soft drinks. ?Other foods ?Cakes and pastries. ?The items listed above may not be a complete list of foods and beverages to avoid. Talk with your dietitian about what choices are best for you. ?Summary ?Fiber is a type of carbohydrate. It is found in foods such as fruits, vegetables, whole grains, and beans. ?A high-fiber diet has many benefits. It can help to prevent constipation, lower blood cholesterol, aid weight loss, and reduce your risk of heart disease, diabetes, and certain cancers. ?Increase your intake of fiber gradually. Increasing fiber too quickly may cause cramping, bloating, and gas. Drink plenty of water while you increase the amount of fiber you consume. ?The best sources of fiber include whole fruits and vegetables, whole grains, nuts, seeds, and beans. ?This information is not intended to replace advice given to you by your health care provider. Make sure you discuss any questions you have with your health care provider. ?Document Revised: 02/17/2020 Document Reviewed: 02/17/2020 ?Elsevier Patient Education ? [place]. ? ?

## 2022-02-12 NOTE — Progress Notes (Signed)
?  ?Karen Darby, MD ?[address]  ?[address]  ?[place], [place] [address]  ?Main: [phone]  ?Fax: [phone] ? ? ? ?Gastroenterology Consultation ? ?Referring Provider:     Pleas Koch, NP ?Primary Care Physician:  Pleas Koch, NP ?Primary Gastroenterologist:  Dr. Cephas Edwards ?Reason for Consultation:     Chronic constipation, abdominal bloating ?      ? HPI:   ?Karen Edwards is a 56 y.o. Caucasian female referred by Dr. Carlis Abbott, Leticia Penna, NP  for consultation & management of chronic constipation postprandial nausea and vomiting. She has history of chronic neck pain and back pain for which she is on long-term opioid use, chronic tobacco use. ? ?Chronic constipation: reports for several years, associated with significant bloating, hard and lumpy stools with significant straining. She had history of hemorrhoidectomy in 2016 at [facility]. She reports having had a colonoscopy prior to hemorrhoidectomy, polyps were detected, report not available. Patient tried over-the-counter stool softeners, laxatives, fiber supplements, MiraLAX which did not help. She was on linaclotide prescribed by her primary care doctor which provided significant relief. However, patient could not afford $45 co-pay monthly. Therefore, she stopped taking it. Currently she is having bowel movements once a week. She denies rectal bleeding. ? ?Postprandial nausea and vomiting: she reports several month history of postprandial nausea and emesis, which occurs about 15 minutes after eating. This occurs particularly after consuming fatty foods, example hamburger that she had it for dinner last night. She denies epigastric pain other than bloating. She denies weight loss. She never had an EGD in the past. She denies heartburn, dysphagia. ? ?Follow-up visit 10/04/2021 ?Patient has not seen me since 2019.  She is here for ongoing constipation and abdominal bloating.  Patient tried Linzess 290 MCG  daily with good efficacy however she could not afford co-pay.  She started taking over-the-counter laxative until recently.  Without any bowel regimen, patient goes without having a bowel movement for 2 weeks.  She does report significant abdominal bloating although she has 1 meal a day.  Her diet generally consists of fast foods only.  She denies carbonated beverages, artificial sweeteners, sugary drinks, sweet tea.  Her last BM was 4 days ago with incomplete emptying.  She does report intermittent rectal bleeding, bright red blood per rectum ? ?Follow-up visit 02/12/2022 ?Patient is here for follow-up of ongoing constipation and severe abdominal bloating and weight gain.  Patient has stopped Linzess because she felt it is no longer working.  She was on 290 mcg daily.  She does admit to drinking sodas daily, fried foods, fried chicken, hamburger almost on a daily basis.  Her diet is totally devoid of fiber.  She feels fatigue all day.  She does report abdominal bloating as well.  She denies any rectal bleeding. ? ?NSAIDs: none ? ?Antiplts/Anticoagulants/Anti thrombotics: none ? ?GI Procedures: colonoscopy in 2016, report not available ?EGD and colonoscopy 05/18/2018 ?- Normal duodenal bulb and second portion of the duodenum. Biopsied. ?- Erythematous mucosa in the gastric body and antrum. Biopsied. ?- Non-bleeding erosive gastropathy. ?- Normal gastroesophageal junction and esophagus. ? ?- One diminutive polyp in the descending colon, removed with a cold biopsy forceps. Resected and retrieved. ?- One 5 mm polyp in the descending colon, removed with a cold snare. Resected and retrieved. ?- The distal rectum and anal verge are normal on retroflexion view. ? ?DIAGNOSIS:  ?A.  DUODENUM; COLD BIOPSY:  ?- DUODENAL MUCOSA WITH PRESERVED VILLOUS  ARCHITECTURE AND BRUNNER'S  ?GLAND HYPERPLASIA.  ?- NEGATIVE FOR INTRA-EPITHELIAL LYMPHOCYTOSIS, DYSPLASIA AND MALIGNANCY.  ? ?B.  RANDOM STOMACH; COLD BIOPSY:  ?- MILD CHRONIC  GASTRITIS.  ?- NEGATIVE FOR H. PYLORI, DYSPLASIA AND MALIGNANCY.  ? ?C.  COLON POLYP X2, DESCENDING; COLD SNARE/COLD BIOPSY:  ?- TUBULAR ADENOMA (1), NEGATIVE FOR HIGH-GRADE DYSPLASIA AND MALIGNANCY.  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 ?She denies family history of GI malignancy ? ?Past Medical History:  ?Diagnosis Date  ? Anxiety   ? Chronic back pain   ? Degenerative joint disease   ? Depression   ? Headache   ? neck pain  ? Hypotension   ? IBS (irritable bowel syndrome) 05/2015  ? MVP (mitral valve prolapse)   ? patient denies  ? ? ?Past Surgical History:  ?Procedure Laterality Date  ? ANKLE SURGERY Right 03/02/2014  ? repair of tendon and sural nerve right ankle  ? CERVICAL SPINE SURGERY  02/21/2016  ? arthrodesis of anterior cervical spine (fusion of C5-6,C6-7 and insertion of bone allograft  ? CESAREAN SECTION    ? [place] TUBAL LIGATION  1990  ? COLONOSCOPY  04/19/2015  ? COLONOSCOPY WITH PROPOFOL N/A 05/18/2018  ? Procedure: COLONOSCOPY WITH PROPOFOL;  Surgeon: Lin Landsman, MD;  Location: [facility] ENDOSCOPY;  Service: Gastroenterology;  Laterality: N/A;  ? ESOPHAGOGASTRODUODENOSCOPY (EGD) WITH PROPOFOL N/A 05/18/2018  ? Procedure: ESOPHAGOGASTRODUODENOSCOPY (EGD) WITH PROPOFOL;  Surgeon: Lin Landsman, MD;  Location: [facility] ENDOSCOPY;  Service: Gastroenterology;  Laterality: N/A;  ? HEMORRHOID SURGERY N/A 06/16/2015  ? Procedure: HEMORRHOIDECTOMY;  Surgeon: Leonie Green, MD;  Location: [facility] ORS;  Service: General;  Laterality: N/A;  ? HYSTEROSCOPY WITH D & C  12/24/2013  ? LUMBAR SPINAL CORD SIMULATOR REVISION N/A 03/14/2020  ? Procedure: LUMBAR SPINAL CORD SIMULATOR REVISION;  Surgeon: Milinda Pointer, MD;  Location: [facility] ORS;  Service: Neurosurgery;  Laterality: N/A;  ? SPINAL CORD STIMULATOR INSERTION N/A 06/18/2018  ? Procedure: LUMBAR SPINAL CORD STIMULATOR INSERTION;  Surgeon: Milinda Pointer, MD;  Location: [facility]  ORS;  Service: Neurosurgery;  Laterality: N/A;  ? TUBAL LIGATION    ? ? ?Current Outpatient Medications:  ?  acetaminophen (TYLENOL) 500 MG tablet, Take 1,000 mg by mouth every 6 (six) hours as needed (for pain.). , Disp: , Rfl:  ?  FLUoxetine (PROZAC) 20 MG capsule, TAKE 3 CAPSULES BY MOUTH ONCE DAILY FOR DEPRESSION, Disp: 270 capsule, Rfl: 1 ?  ondansetron (ZOFRAN-ODT) 4 MG disintegrating tablet, Take 4 mg by mouth every 4 (four) hours as needed (vertigo/dizziness). , Disp: , Rfl:  ?  [START ON 02/13/2022] oxyCODONE (OXY IR/ROXICODONE) 5 MG immediate release tablet, Take 1 tablet (5 mg total) by mouth 2 (two) times daily as needed for severe pain. Must last 30 days., Disp: 40 tablet, Rfl: 0 ?  pregabalin (LYRICA) 25 MG capsule, Take 1 capsule (25 mg total) by mouth daily for 7 days, THEN 1 capsule (25 mg total) 2 (two) times daily for 7 days, THEN 1 capsule (25 mg total) 3 (three) times daily., Disp: 111 capsule, Rfl: 0 ?  scopolamine (TRANSDERM-SCOP) 1 MG/3DAYS, Place 1 patch onto the skin daily as needed (vertigo/dizziness)., Disp: , Rfl:  ?  tiZANidine (ZANAFLEX) 4 MG tablet, Take 1-2 tablets by mouth at bedtime as needed., Disp: , Rfl:  ?  traZODone (DESYREL) 100 MG tablet, TAKE 3 TABLETS BY MOUTH AT BEDTIME FOR SLEEP, Disp: 270 tablet, Rfl: 0 ? ? ?  Family History  ?Problem Relation Age of Onset  ? Arthritis Mother   ? Depression Mother   ? Hyperlipidemia Mother   ? Hypertension Mother   ? Kidney disease Mother   ? Vision loss Mother   ? Hypothyroidism Mother   ? Hearing loss Father   ? Hyperlipidemia Father   ? Hypertension Father   ? Diabetes Mellitus I Maternal Grandmother   ? Stroke Maternal Grandmother   ? Congestive Heart Failure Maternal Grandfather   ? Heart attack Maternal Grandfather   ? Prostate cancer Paternal Grandfather   ? Thyroid disease Maternal Aunt   ? Diabetes Maternal Aunt   ? Gout Maternal Aunt   ?  ? ?Social History  ? ?Tobacco Use  ? Smoking status: Every Day  ?  Packs/day: 0.50  ?   Years: 37.00  ?  Pack years: 18.50  ?  Types: Cigarettes  ? Smokeless tobacco: Never  ?Vaping Use  ? Vaping Use: Never used  ?Substance Use Topics  ? Alcohol use: Yes  ?  Alcohol/week: 1.0 standard drink  ?  Types: 1

## 2022-02-25 ENCOUNTER — Other Ambulatory Visit: Payer: Self-pay | Admitting: Primary Care

## 2022-02-25 DIAGNOSIS — G894 Chronic pain syndrome: Secondary | ICD-10-CM

## 2022-02-25 DIAGNOSIS — G8929 Other chronic pain: Secondary | ICD-10-CM

## 2022-02-27 ENCOUNTER — Other Ambulatory Visit: Payer: Self-pay | Admitting: Primary Care

## 2022-02-27 DIAGNOSIS — F32A Depression, unspecified: Secondary | ICD-10-CM

## 2022-02-28 ENCOUNTER — Ambulatory Visit: Payer: Medicare PPO | Admitting: Pain Medicine

## 2022-03-08 ENCOUNTER — Other Ambulatory Visit: Payer: Self-pay | Admitting: Primary Care

## 2022-03-08 DIAGNOSIS — G4700 Insomnia, unspecified: Secondary | ICD-10-CM

## 2022-03-25 NOTE — Progress Notes (Unsigned)
PROVIDER NOTE: Interpretation of information contained herein should be left to medically-trained personnel. Specific patient instructions are provided elsewhere under "Patient Instructions" section of medical record. This document was created in part using STT-dictation technology, any transcriptional errors that may result from this process are unintentional.  Patient: Karen Edwards Type: Established DOB: 16-Oct-1966 MRN: [id number] PCP: Pleas Koch, NP  Service: Procedure DOS: 03/26/2022 Setting: Ambulatory Location: Ambulatory outpatient facility Delivery: Face-to-face Provider: Gaspar Cola, MD Specialty: Interventional Pain Management Specialty designation: 09 Location: Outpatient facility Ref. Prov.: Pleas Koch, NP    Primary Reason for Visit: Interventional Pain Management Treatment. CC: No chief complaint on file.   Procedure:           Type: Lumbar Facet, Medial Branch Block(s)          Laterality: Bilateral  Level: L2, L3, L4, L5, & S1 Medial Branch Level(s). Injecting these levels blocks the L3-4 and L5-S1 lumbar facet joints. Imaging: Fluoroscopic guidance Anesthesia: Local anesthesia (1-2% Lidocaine) Anxiolysis: IV Versed         Sedation: None. DOS: 03/26/2022 Performed by: Gaspar Cola, MD  Primary Purpose: Diagnostic/Therapeutic Indications: Low back pain severe enough to impact quality of life or function. No diagnosis found. NAS-11 Pain score:   Pre-procedure:  /10   Post-procedure:  /10     Position / Prep / Materials:  Position: Prone  Prep solution: DuraPrep (Iodine Povacrylex [0.7% available iodine] and Isopropyl Alcohol, 74% w/w) Area Prepped: Posterolateral Lumbosacral Spine (Wide prep: From the lower border of the scapula down to the end of the tailbone and from flank to flank.)  Materials:  Tray: Block Needle(s):  Type: Spinal  Gauge (G): 22  Length: 5-in Qty: 4  Pre-op H&P Assessment:  Karen Edwards is a 56 y.o.  (year old), female patient, seen today for interventional treatment. She  has a past surgical history that includes Cesarean section; Ankle surgery (Right, 03/02/2014); Hemorrhoid surgery (N/A, 06/16/2015); Cervical spine surgery (02/21/2016); Cesarean section with bilateral tubal ligation (1990); Colonoscopy (04/19/2015); Hysteroscopy with D & C (12/24/2013); Colonoscopy with propofol (N/A, 05/18/2018); Esophagogastroduodenoscopy (egd) with propofol (N/A, 05/18/2018); Spinal cord stimulator insertion (N/A, 06/18/2018); Tubal ligation; and Lumbar spinal cord simulator revision (N/A, 03/14/2020). Karen Edwards has a current medication list which includes the following prescription(s): acetaminophen, fluoxetine, ondansetron, oxycodone, pregabalin, scopolamine, tizanidine, and trazodone. Her primarily concern today is the No chief complaint on file.  Initial Vital Signs:  Pulse/HCG Rate:    Temp:   Resp:   BP:   SpO2:    BMI: Estimated body mass index is 29.36 kg/m as calculated from the following:   Height as of 02/12/22: '5\' 1"'$  (1.549 m).   Weight as of 02/12/22: 155 lb 6 oz (70.5 kg).  Risk Assessment: Allergies: Reviewed. She is allergic to gabapentin.  Allergy Precautions: None required Coagulopathies: Reviewed. None identified.  Blood-thinner therapy: None at this time Active Infection(s): Reviewed. None identified. Karen Edwards is afebrile  Site Confirmation: Karen Edwards was asked to confirm the procedure and laterality before marking the site Procedure checklist: Completed Consent: Before the procedure and under the influence of no sedative(s), amnesic(s), or anxiolytics, the patient was informed of the treatment options, risks and possible complications. To fulfill our ethical and legal obligations, as recommended by the American Medical Association's Code of Ethics, I have informed the patient of my clinical impression; the nature and purpose of the treatment or procedure; the risks, benefits,  and possible complications of the intervention; the  alternatives, including doing nothing; the risk(s) and benefit(s) of the alternative treatment(s) or procedure(s); and the risk(s) and benefit(s) of doing nothing. The patient was provided information about the general risks and possible complications associated with the procedure. These may include, but are not limited to: failure to achieve desired goals, infection, bleeding, organ or nerve damage, allergic reactions, paralysis, and death. In addition, the patient was informed of those risks and complications associated to Spine-related procedures, such as failure to decrease pain; infection (i.e.: Meningitis, epidural or intraspinal abscess); bleeding (i.e.: epidural hematoma, subarachnoid hemorrhage, or any other type of intraspinal or peri-dural bleeding); organ or nerve damage (i.e.: Any type of peripheral nerve, nerve root, or spinal cord injury) with subsequent damage to sensory, motor, and/or autonomic systems, resulting in permanent pain, numbness, and/or weakness of one or several areas of the body; allergic reactions; (i.e.: anaphylactic reaction); and/or death. Furthermore, the patient was informed of those risks and complications associated with the medications. These include, but are not limited to: allergic reactions (i.e.: anaphylactic or anaphylactoid reaction(s)); adrenal axis suppression; blood sugar elevation that in diabetics may result in ketoacidosis or comma; water retention that in patients with history of congestive heart failure may result in shortness of breath, pulmonary edema, and decompensation with resultant heart failure; weight gain; swelling or edema; medication-induced neural toxicity; particulate matter embolism and blood vessel occlusion with resultant organ, and/or nervous system infarction; and/or aseptic necrosis of one or more joints. Finally, the patient was informed that Medicine is not an exact science; therefore,  there is also the possibility of unforeseen or unpredictable risks and/or possible complications that may result in a catastrophic outcome. The patient indicated having understood very clearly. We have given the patient no guarantees and we have made no promises. Enough time was given to the patient to ask questions, all of which were answered to the patient's satisfaction. Karen Edwards has indicated that she wanted to continue with the procedure. Attestation: I, the ordering provider, attest that I have discussed with the patient the benefits, risks, side-effects, alternatives, likelihood of achieving goals, and potential problems during recovery for the procedure that I have provided informed consent. Date  Time: {CHL [facility] TIME CHOICES:[id number]}  Pre-Procedure Preparation:  Monitoring: As per clinic protocol. Respiration, ETCO2, SpO2, BP, heart rate and rhythm monitor placed and checked for adequate function Safety Precautions: Patient was assessed for positional comfort and pressure points before starting the procedure. Time-out: I initiated and conducted the "Time-out" before starting the procedure, as per protocol. The patient was asked to participate by confirming the accuracy of the "Time Out" information. Verification of the correct person, site, and procedure were performed and confirmed by me, the nursing staff, and the patient. "Time-out" conducted as per Joint Commission's Universal Protocol (UP.01.01.01). Time:    Description of Procedure:          Laterality: Bilateral. The procedure was performed in identical fashion on both sides. Targeted Levels:  L2, L3, L4, L5, & S1 Medial Branch Level(s)  Safety Precautions: Aspiration looking for blood return was conducted prior to all injections. At no point did we inject any substances, as a needle was being advanced. Before injecting, the patient was told to immediately notify me if she was experiencing any new onset of "ringing in the  ears, or metallic taste in the mouth". No attempts were made at seeking any paresthesias. Safe injection practices and needle disposal techniques used. Medications properly checked for expiration dates. SDV (single dose vial) medications used.  After the completion of the procedure, all disposable equipment used was discarded in the proper designated medical waste containers. Local Anesthesia: Protocol guidelines were followed. The patient was positioned over the fluoroscopy table. The area was prepped in the usual manner. The time-out was completed. The target area was identified using fluoroscopy. A 12-in long, straight, sterile hemostat was used with fluoroscopic guidance to locate the targets for each level blocked. Once located, the skin was marked with an approved surgical skin marker. Once all sites were marked, the skin (epidermis, dermis, and hypodermis), as well as deeper tissues (fat, connective tissue and muscle) were infiltrated with a small amount of a short-acting local anesthetic, loaded on a 10cc syringe with a 25G, 1.5-in  Needle. An appropriate amount of time was allowed for local anesthetics to take effect before proceeding to the next step. Local Anesthetic: Lidocaine 2.0% The unused portion of the local anesthetic was discarded in the proper designated containers. Technical description of process:  L2 Medial Branch Nerve Block (MBB): The target area for the L2 medial branch is at the junction of the postero-lateral aspect of the superior articular process and the superior, posterior, and medial edge of the transverse process of L3. Under fluoroscopic guidance, a Quincke needle was inserted until contact was made with os over the superior postero-lateral aspect of the pedicular shadow (target area). After negative aspiration for blood, 0.5 mL of the nerve block solution was injected without difficulty or complication. The needle was removed intact. L3 Medial Branch Nerve Block (MBB): The  target area for the L3 medial branch is at the junction of the postero-lateral aspect of the superior articular process and the superior, posterior, and medial edge of the transverse process of L4. Under fluoroscopic guidance, a Quincke needle was inserted until contact was made with os over the superior postero-lateral aspect of the pedicular shadow (target area). After negative aspiration for blood, 0.5 mL of the nerve block solution was injected without difficulty or complication. The needle was removed intact. L4 Medial Branch Nerve Block (MBB): The target area for the L4 medial branch is at the junction of the postero-lateral aspect of the superior articular process and the superior, posterior, and medial edge of the transverse process of L5. Under fluoroscopic guidance, a Quincke needle was inserted until contact was made with os over the superior postero-lateral aspect of the pedicular shadow (target area). After negative aspiration for blood, 0.5 mL of the nerve block solution was injected without difficulty or complication. The needle was removed intact. L5 Medial Branch Nerve Block (MBB): The target area for the L5 medial branch is at the junction of the postero-lateral aspect of the superior articular process and the superior, posterior, and medial edge of the sacral ala. Under fluoroscopic guidance, a Quincke needle was inserted until contact was made with os over the superior postero-lateral aspect of the pedicular shadow (target area). After negative aspiration for blood, 0.5 mL of the nerve block solution was injected without difficulty or complication. The needle was removed intact. S1 Medial Branch Nerve Block (MBB): The target area for the S1 medial branch is at the posterior and inferior 6 o'clock position of the L5-S1 facet joint. Under fluoroscopic guidance, the Quincke needle inserted for the L5 MBB was redirected until contact was made with os over the inferior and postero aspect of the  sacrum, at the 6 o' clock position under the L5-S1 facet joint (Target area). After negative aspiration for blood, 0.5 mL of the nerve block  solution was injected without difficulty or complication. The needle was removed intact.  Once the entire procedure was completed, the treated area was cleaned, making sure to leave some of the prepping solution back to take advantage of its long term bactericidal properties.      Illustration of the posterior view of the lumbar spine and the posterior neural structures. Laminae of L2 through S1 are labeled. DPRL5, dorsal primary ramus of L5; DPRS1, dorsal primary ramus of S1; DPR3, dorsal primary ramus of L3; FJ, facet (zygapophyseal) joint L3-L4; I, inferior articular process of L4; LB1, lateral branch of dorsal primary ramus of L1; IAB, inferior articular branches from L3 medial branch (supplies L4-L5 facet joint); IBP, intermediate branch plexus; MB3, medial branch of dorsal primary ramus of L3; NR3, third lumbar nerve root; S, superior articular process of L5; SAB, superior articular branches from L4 (supplies L4-5 facet joint also); TP3, transverse process of L3.  There were no vitals filed for this visit.   Start Time:   hrs. End Time:   hrs.  Imaging Guidance (Spinal):          Type of Imaging Technique: Fluoroscopy Guidance (Spinal) Indication(s): Assistance in needle guidance and placement for procedures requiring needle placement in or near specific anatomical locations not easily accessible without such assistance. Exposure Time: Please see nurses notes. Contrast: None used. Fluoroscopic Guidance: I was personally present during the use of fluoroscopy. "Tunnel Vision Technique" used to obtain the best possible view of the target area. Parallax error corrected before commencing the procedure. "Direction-depth-direction" technique used to introduce the needle under continuous pulsed fluoroscopy. Once target was reached, antero-posterior, oblique,  and lateral fluoroscopic projection used confirm needle placement in all planes. Images permanently stored in EMR. Interpretation: No contrast injected. I personally interpreted the imaging intraoperatively. Adequate needle placement confirmed in multiple planes. Permanent images saved into the patient's record.  Antibiotic Prophylaxis:   Anti-infectives (From admission, onward)    None      Indication(s): None identified  Post-operative Assessment:  Post-procedure Vital Signs:  Pulse/HCG Rate:    Temp:   Resp:   BP:   SpO2:    EBL: None  Complications: No immediate post-treatment complications observed by team, or reported by patient.  Note: The patient tolerated the entire procedure well. A repeat set of vitals were taken after the procedure and the patient was kept under observation following institutional policy, for this type of procedure. Post-procedural neurological assessment was performed, showing return to baseline, prior to discharge. The patient was provided with post-procedure discharge instructions, including a section on how to identify potential problems. Should any problems arise concerning this procedure, the patient was given instructions to immediately contact us, at any time, without hesitation. In any case, we plan to contact the patient by telephone for a follow-up status report regarding this interventional procedure.  Comments:  No additional relevant information.  Plan of Care  Orders:  No orders of the defined types were placed in this encounter.  Chronic Opioid Analgesic:  Oxycodone IR 5 mg, 1 tab PO QD (5 mg/day of oxycodone) PRN MME/day: 7.5 mg/day.   Medications ordered for procedure: No orders of the defined types were placed in this encounter.  Medications administered: Karen Edwards had no medications administered during this visit.  See the medical record for exact dosing, route, and time of administration.  Follow-up plan:   No  follow-ups on file.       Interventional Therapies  Risk  Complexity  Considerations:   Estimated body mass index is 27.07 kg/m as calculated from the following:   Height as of this encounter: '5\' 2"'$  (1.575 m).   Weight as of this encounter: 148 lb (67.1 kg). WNL   Planned  Pending:   Therapeutic bilateral lumbar facet MBB #4 + bilateral SI joint Blk #3    Under consideration:   Therapeutic right L1-2 & L3-4 percutaneous discectomy ([employer] compressor)  Therapeutic intrathecal pump trial    Completed:   Therapeutic right cervical ESI x1 (08/24/2020) (100/100/50/50)  Diagnostic bilateral GONB x1 (08/19/2019) (100/100/100/,50)  Palliative right cervical facet MBB x3 (07/23/2018) (100/100/100/>50)  Palliative left cervical facet MBB x4 (04/01/2019) (90/90/0/0)  Palliative right cervical facet RFA x1 (09/17/2018) (100/100/85/85)  Palliative left cervical facet RFA x1 (05/04/2019) (100/100/50/50)  Therapeutic left L1-2 LESI x1 (05/10/2021) (100/100/100 x 2 days/0)  Therapeutic right L1-2 LESI x2 (02/10/2018) (100/100/70/50-75)  Therapeutic right L1 TFESI x1 (02/10/2018) (100/100/70/50-75)  Diagnostic/therapeutic left superior cluneal NB (L2, L3 dorsal rami) x1 (04/24/2021) (0/0/0/0)  Diagnostic/therapeutic left L1 TFESI x2 (11/15/2021) (100/100/0)  Diagnostic/therapeutic left L3 TFESI x2 (11/15/2021) (100/100/0)  Bilateral lumbar spinal cord stimulator trial (done) (05/14/2018) (by me)  Bilateral lumbar spinal cord stimulator implant (09/28/2019) (by me)  Bilateral spinal cord stimulator revision (03/14/2020) (by me)  Diagnostic bilateral lumbar facet MBB x3 (11/30/2019) (100/100/50/20)  Diagnostic bilateral SI joint Blk x2 (09/18/2021) (100/100/100)  Palliative left lumbar facet RFA x2 (03/27/2021) (100/100/0)  Therapeutic right lumbar facet RFA x2 (02/01/2021) (100/100/0/0)    Therapeutic  Palliative (PRN) options:   Palliative right cervical facet RFA   Diagnostic bilateral SI joint Blk    Therapeutic bilateral lumbar facet RFA      Recent Visits Date Type Provider Dept  02/06/22 Office Visit Milinda Pointer, MD [facility]  Showing recent visits within past 90 days and meeting all other requirements Future Appointments Date Type Provider Dept  03/26/22 Appointment Milinda Pointer, MD [facility]  05/06/22 Appointment Milinda Pointer, MD [facility]  Showing future appointments within next 90 days and meeting all other requirements  Disposition: Discharge home  Discharge (Date  Time): 03/26/2022;   hrs.   Primary Care Physician: Pleas Koch, NP Location: [facility] Note by: Gaspar Cola, MD Date: 03/26/2022; Time: 2:42 PM  Disclaimer:  Medicine is not an Chief Strategy Officer. The only guarantee in medicine is that nothing is guaranteed. It is important to note that the decision to proceed with this intervention was based on the information collected from the patient. The Data and conclusions were drawn from the patient's questionnaire, the interview, and the physical examination. Because the information was provided in large part by the patient, it cannot be guaranteed that it has not been purposely or unconsciously manipulated. Every effort has been made to obtain as much relevant data as possible for this evaluation. It is important to note that the conclusions that lead to this procedure are derived in large part from the available data. Always take into account that the treatment will also be dependent on availability of resources and existing treatment guidelines, considered by other Pain Management Practitioners as being common knowledge and practice, at the time of the intervention. For Medico-Legal purposes, it is also important to point out that variation in procedural techniques and pharmacological choices are the acceptable norm. The indications, contraindications, technique, and results of the  above procedure should only be interpreted and judged by a Board-Certified Interventional Pain Specialist with extensive familiarity  and expertise in the same exact procedure and technique.

## 2022-03-26 ENCOUNTER — Ambulatory Visit: Payer: Medicare PPO | Attending: Pain Medicine | Admitting: Pain Medicine

## 2022-03-26 ENCOUNTER — Encounter: Payer: Self-pay | Admitting: Pain Medicine

## 2022-03-26 ENCOUNTER — Other Ambulatory Visit: Payer: Self-pay

## 2022-03-26 ENCOUNTER — Ambulatory Visit
Admission: RE | Admit: 2022-03-26 | Discharge: 2022-03-26 | Disposition: A | Discharge status: Home / Self Care | Payer: Medicare PPO | Source: Ambulatory Visit | Attending: Pain Medicine | Admitting: Pain Medicine

## 2022-03-26 VITALS — BP 107/64 | HR 76 | Temp 98.1°F | Resp 15 | Ht 61.0 in | Wt 150.0 lb

## 2022-03-26 DIAGNOSIS — G8929 Other chronic pain: Secondary | ICD-10-CM | POA: Insufficient documentation

## 2022-03-26 DIAGNOSIS — M47816 Spondylosis without myelopathy or radiculopathy, lumbar region: Secondary | ICD-10-CM | POA: Diagnosis not present

## 2022-03-26 DIAGNOSIS — M25551 Pain in right hip: Secondary | ICD-10-CM | POA: Diagnosis not present

## 2022-03-26 DIAGNOSIS — M47817 Spondylosis without myelopathy or radiculopathy, lumbosacral region: Secondary | ICD-10-CM | POA: Diagnosis not present

## 2022-03-26 DIAGNOSIS — M5137 Other intervertebral disc degeneration, lumbosacral region: Secondary | ICD-10-CM | POA: Insufficient documentation

## 2022-03-26 DIAGNOSIS — M25552 Pain in left hip: Secondary | ICD-10-CM | POA: Diagnosis not present

## 2022-03-26 DIAGNOSIS — M545 Low back pain, unspecified: Secondary | ICD-10-CM | POA: Insufficient documentation

## 2022-03-26 MED ORDER — FENTANYL CITRATE (PF) 100 MCG/2ML IJ SOLN
25.0000 ug | INTRAMUSCULAR | Status: DC | PRN
  Administered 2022-03-26: 50 ug via INTRAVENOUS

## 2022-03-26 MED ORDER — TRIAMCINOLONE ACETONIDE 40 MG/ML IJ SUSP
INTRAMUSCULAR | Status: AC
  Filled 2022-03-26: qty 2

## 2022-03-26 MED ORDER — MIDAZOLAM HCL 5 MG/5ML IJ SOLN
INTRAMUSCULAR | Status: AC
Start: 2022-03-26 — End: ?
  Filled 2022-03-26: qty 5

## 2022-03-26 MED ORDER — LIDOCAINE HCL 2 % IJ SOLN
20.0000 mL | Freq: Once | INTRAMUSCULAR | Status: AC
  Administered 2022-03-26: 400 mg

## 2022-03-26 MED ORDER — TRIAMCINOLONE ACETONIDE 40 MG/ML IJ SUSP
80.0000 mg | Freq: Once | INTRAMUSCULAR | Status: AC
  Administered 2022-03-26: 80 mg

## 2022-03-26 MED ORDER — ROPIVACAINE HCL 2 MG/ML IJ SOLN
INTRAMUSCULAR | Status: AC
  Filled 2022-03-26: qty 20

## 2022-03-26 MED ORDER — MIDAZOLAM HCL 5 MG/5ML IJ SOLN
0.5000 mg | Freq: Once | INTRAMUSCULAR | Status: AC
  Administered 2022-03-26: 3 mg via INTRAVENOUS

## 2022-03-26 MED ORDER — ROPIVACAINE HCL 2 MG/ML IJ SOLN
18.0000 mL | Freq: Once | INTRAMUSCULAR | Status: AC
  Administered 2022-03-26: 18 mL via PERINEURAL

## 2022-03-26 MED ORDER — LIDOCAINE HCL 2 % IJ SOLN
INTRAMUSCULAR | Status: AC
  Filled 2022-03-26: qty 20

## 2022-03-26 MED ORDER — LACTATED RINGERS IV SOLN
1000.0000 mL | Freq: Once | INTRAVENOUS | Status: AC
  Administered 2022-03-26: 1000 mL via INTRAVENOUS

## 2022-03-26 MED ORDER — FENTANYL CITRATE (PF) 100 MCG/2ML IJ SOLN
INTRAMUSCULAR | Status: AC
  Filled 2022-03-26: qty 2

## 2022-03-26 NOTE — Progress Notes (Signed)
Safety precautions to be maintained throughout the outpatient stay will include: orient to surroundings, keep bed in low position, maintain call bell within reach at all times, provide assistance with transfer out of bed and ambulation.  

## 2022-03-26 NOTE — Patient Instructions (Signed)

## 2022-03-27 ENCOUNTER — Telehealth: Payer: Self-pay

## 2022-03-27 NOTE — Telephone Encounter (Signed)
Post procedure follow up.  LM 

## 2022-04-06 DIAGNOSIS — S20219A Contusion of unspecified front wall of thorax, initial encounter: Secondary | ICD-10-CM | POA: Diagnosis not present

## 2022-04-06 DIAGNOSIS — F17210 Nicotine dependence, cigarettes, uncomplicated: Secondary | ICD-10-CM | POA: Diagnosis not present

## 2022-04-06 DIAGNOSIS — S20211A Contusion of right front wall of thorax, initial encounter: Secondary | ICD-10-CM | POA: Diagnosis not present

## 2022-04-08 DIAGNOSIS — M549 Dorsalgia, unspecified: Secondary | ICD-10-CM | POA: Diagnosis not present

## 2022-04-08 DIAGNOSIS — W19XXXD Unspecified fall, subsequent encounter: Secondary | ICD-10-CM | POA: Diagnosis not present

## 2022-04-08 NOTE — Progress Notes (Signed)
Patient reports that's he fell while getting out of boat at [facility] on Saturday.  2 rib fractures.  Was prescribed oxycodone - apap 5-325 mg 1 tablet every 6 hrs prn.

## 2022-04-09 ENCOUNTER — Ambulatory Visit: Payer: Medicare PPO | Attending: Pain Medicine | Admitting: Pain Medicine

## 2022-04-09 DIAGNOSIS — M47816 Spondylosis without myelopathy or radiculopathy, lumbar region: Secondary | ICD-10-CM

## 2022-04-09 DIAGNOSIS — M545 Low back pain, unspecified: Secondary | ICD-10-CM

## 2022-04-09 DIAGNOSIS — S2241XD Multiple fractures of ribs, right side, subsequent encounter for fracture with routine healing: Secondary | ICD-10-CM

## 2022-04-09 DIAGNOSIS — G8929 Other chronic pain: Secondary | ICD-10-CM

## 2022-04-09 DIAGNOSIS — M549 Dorsalgia, unspecified: Secondary | ICD-10-CM | POA: Diagnosis not present

## 2022-04-09 DIAGNOSIS — M5137 Other intervertebral disc degeneration, lumbosacral region: Secondary | ICD-10-CM | POA: Diagnosis not present

## 2022-04-09 DIAGNOSIS — S2231XA Fracture of one rib, right side, initial encounter for closed fracture: Secondary | ICD-10-CM | POA: Insufficient documentation

## 2022-04-09 DIAGNOSIS — W19XXXD Unspecified fall, subsequent encounter: Secondary | ICD-10-CM | POA: Diagnosis not present

## 2022-04-09 DIAGNOSIS — M47817 Spondylosis without myelopathy or radiculopathy, lumbosacral region: Secondary | ICD-10-CM | POA: Diagnosis not present
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
## 2022-04-09 NOTE — Patient Instructions (Signed)
______________________________________________________________________  Preparing for Procedure with Sedation  NOTICE: Due to recent regulatory changes, starting on May 28, 2021, procedures requiring intravenous (IV) sedation will no longer be performed at the [facility].  These types of procedures are required to be performed at [facility] ambulatory surgery facility.  We are very sorry for the inconvenience.  Procedure appointments are limited to planned procedures: No Prescription Refills. No disability issues will be discussed. No medication changes will be discussed.  Instructions: Oral Intake: Do not eat or drink anything for at least 8 hours prior to your procedure. (Exception: Blood Pressure Medication. See below.) Transportation: A driver is required. You may not drive yourself after the procedure. Blood Pressure Medicine: Do not forget to take your blood pressure medicine with a sip of water the morning of the procedure. If your Diastolic (lower reading) is above 100 mmHg, elective cases will be cancelled/rescheduled. Blood thinners: These will need to be stopped for procedures. Notify our staff if you are taking any blood thinners. Depending on which one you take, there will be specific instructions on how and when to stop it. Diabetics on insulin: Notify the staff so that you can be scheduled 1st case in the morning. If your diabetes requires high dose insulin, take only  of your normal insulin dose the morning of the procedure and notify the staff that you have done so. Preventing infections: Shower with an antibacterial soap the morning of your procedure. Build-up your immune system: Take 1000 mg of Vitamin C with every meal (3 times a day) the day prior to your procedure. Antibiotics: Inform the staff if you have a condition or reason that requires you to take antibiotics before dental procedures. Pregnancy: If you are pregnant, call and cancel the procedure. Sickness: If  you have a cold, fever, or any active infections, call and cancel the procedure. Arrival: You must be in the facility at least 30 minutes prior to your scheduled procedure. Children: Do not bring children with you. Dress appropriately: There is always the possibility that your clothing may get soiled. Valuables: Do not bring any jewelry or valuables.  Reasons to call and reschedule or cancel your procedure: (Following these recommendations will minimize the risk of a serious complication.) Surgeries: Avoid having procedures within 2 weeks of any surgery. (Avoid for 2 weeks before or after any surgery). Flu Shots: Avoid having procedures within 2 weeks of a flu shots. (Avoid for 2 weeks before or after immunizations). Barium: Avoid having a procedure within 7-10 days after having had a radiological study involving the use of radiological contrast. (Myelograms, Barium swallow or enema study). Heart attacks: Avoid any elective procedures or surgeries for the initial 6 months after a "Myocardial Infarction" (Heart Attack). Blood thinners: It is imperative that you stop these medications before procedures. Let us know if you if you take any blood thinner.  Infection: Avoid procedures during or within two weeks of an infection (including chest colds or gastrointestinal problems). Symptoms associated with infections include: Localized redness, fever, chills, night sweats or profuse sweating, burning sensation when voiding, cough, congestion, stuffiness, runny nose, sore throat, diarrhea, nausea, vomiting, cold or Flu symptoms, recent or current infections. It is specially important if the infection is over the area that we intend to treat. Heart and lung problems: Symptoms that may suggest an active cardiopulmonary problem include: cough, chest pain, breathing difficulties or shortness of breath, dizziness, ankle swelling, uncontrolled high or unusually low blood pressure, and/or palpitations. If you are    experiencing any of these symptoms, cancel your procedure and contact your primary care physician for an evaluation.  Remember:  Regular Business hours are:  Monday to Thursday 8:00 AM to 4:00 PM  Provider's Schedule: Oveda Dadamo, MD:  Procedure days: Tuesday and Thursday 7:30 AM to 4:00 PM  Bilal Lateef, MD:  Procedure days: Monday and Wednesday 7:30 AM to 4:00 PM ______________________________________________________________________  ____________________________________________________________________________________________  General Risks and Possible Complications  Patient Responsibilities: It is important that you read this as it is part of your informed consent. It is our duty to inform you of the risks and possible complications associated with treatments offered to you. It is your responsibility as a patient to read this and to ask questions about anything that is not clear or that you believe was not covered in this document.  Patient's Rights: You have the right to refuse treatment. You also have the right to change your mind, even after initially having agreed to have the treatment done. However, under this last option, if you wait until the last second to change your mind, you may be charged for the materials used up to that point.  Introduction: Medicine is not an exact science. Everything in Medicine, including the lack of treatment(s), carries the potential for danger, harm, or loss (which is by definition: Risk). In Medicine, a complication is a secondary problem, condition, or disease that can aggravate an already existing one. All treatments carry the risk of possible complications. The fact that a side effects or complications occurs, does not imply that the treatment was conducted incorrectly. It must be clearly understood that these can happen even when everything is done following the highest safety standards.  No treatment: You can choose not to proceed with the  proposed treatment alternative. The "PRO(s)" would include: avoiding the risk of complications associated with the therapy. The "CON(s)" would include: not getting any of the treatment benefits. These benefits fall under one of three categories: diagnostic; therapeutic; and/or palliative. Diagnostic benefits include: getting information which can ultimately lead to improvement of the disease or symptom(s). Therapeutic benefits are those associated with the successful treatment of the disease. Finally, palliative benefits are those related to the decrease of the primary symptoms, without necessarily curing the condition (example: decreasing the pain from a flare-up of a chronic condition, such as incurable terminal cancer).  General Risks and Complications: These are associated to most interventional treatments. They can occur alone, or in combination. They fall under one of the following six (6) categories: no benefit or worsening of symptoms; bleeding; infection; nerve damage; allergic reactions; and/or death. No benefits or worsening of symptoms: In Medicine there are no guarantees, only probabilities. No healthcare provider can ever guarantee that a medical treatment will work, they can only state the probability that it may. Furthermore, there is always the possibility that the condition may worsen, either directly, or indirectly, as a consequence of the treatment. Bleeding: This is more common if the patient is taking a blood thinner, either prescription or over the counter (example: Goody Powders, Fish oil, Aspirin, Garlic, etc.), or if suffering a condition associated with impaired coagulation (example: Hemophilia, cirrhosis of the liver, low platelet counts, etc.). However, even if you do not have one on these, it can still happen. If you have any of these conditions, or take one of these drugs, make sure to notify your treating physician. Infection: This is more common in patients with a compromised  immune system, either due to disease (example:   diabetes, cancer, human immunodeficiency virus [HIV], etc.), or due to medications or treatments (example: therapies used to treat cancer and rheumatological diseases). However, even if you do not have one on these, it can still happen. If you have any of these conditions, or take one of these drugs, make sure to notify your treating physician. Nerve Damage: This is more common when the treatment is an invasive one, but it can also happen with the use of medications, such as those used in the treatment of cancer. The damage can occur to small secondary nerves, or to large primary ones, such as those in the spinal cord and brain. This damage may be temporary or permanent and it may lead to impairments that can range from temporary numbness to permanent paralysis and/or brain death. Allergic Reactions: Any time a substance or material comes in contact with our body, there is the possibility of an allergic reaction. These can range from a mild skin rash (contact dermatitis) to a severe systemic reaction (anaphylactic reaction), which can result in death. Death: In general, any medical intervention can result in death, most of the time due to an unforeseen complication. ____________________________________________________________________________________________  

## 2022-04-10 ENCOUNTER — Telehealth: Payer: Self-pay

## 2022-04-10 ENCOUNTER — Telehealth: Payer: Self-pay | Admitting: Pain Medicine
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
## 2022-04-10 NOTE — Telephone Encounter (Signed)
Called patient to give pre procedure instructions.

## 2022-04-16 ENCOUNTER — Encounter: Payer: Medicare PPO | Admitting: Primary Care

## 2022-05-06 ENCOUNTER — Encounter: Payer: Medicare PPO | Admitting: Pain Medicine

## 2022-05-14 ENCOUNTER — Ambulatory Visit: Payer: Medicare PPO | Admitting: Pain Medicine

## 2022-05-15 ENCOUNTER — Encounter: Payer: Medicare PPO | Admitting: Pain Medicine

## 2022-05-20 NOTE — Progress Notes (Unsigned)
PROVIDER NOTE: Interpretation of information contained herein should be left to medically-trained personnel. Specific patient instructions are provided elsewhere under "Patient Instructions" section of medical record. This document was created in part using STT-dictation technology, any transcriptional errors that may result from this process are unintentional.  Patient: Karen Edwards Type: Established DOB: Nov 20, 1965 MRN: [id number] PCP: Pleas Koch, NP  Service: Procedure DOS: 05/21/2022 Setting: Ambulatory Location: Ambulatory outpatient facility Delivery: Face-to-face Provider: Gaspar Cola, MD Specialty: Interventional Pain Management Specialty designation: 09 Location: Outpatient facility Ref. Prov.: Milinda Pointer, MD    Procedure:           Type: Lumbar Facet, Medial Branch Radiofrequency Ablation (RFA) #3  Laterality: Right  Level: L2, L3, L4, L5, & S1 Medial Branch Level(s). These levels will denervate the L3-4, L4-5 and L5-S1 lumbar facet joints.  Imaging: Fluoroscopy-guided Anesthesia: Local anesthesia (1-2% Lidocaine) Anxiolysis: IV Versed         Sedation: None. DOS: 05/21/2022  Performed by: Gaspar Cola, MD  Purpose: Therapeutic/Palliative Indications: Low back pain severe enough to impact quality of life or function. Indications: 1. 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 Lumbar facet hypertrophy (Bilateral)   3. Spondylosis without myelopathy or radiculopathy, lumbosacral region   4. DDD (degenerative disc disease), lumbosacral   5. Chronic low back pain (Bilateral) w/o sciatica    Presence of neurostimulator (Thoracolumbar)    Ms. Pascarella has been dealing with the above chronic pain for longer than three months and has either failed to respond, was unable to tolerate, or simply did not get enough benefit from other more conservative therapies including, but not limited to: 1. Over-the-counter medications 2. Anti-inflammatory  medications 3. Muscle relaxants 4. Membrane stabilizers 5. Opioids 6. Physical therapy and/or chiropractic manipulation 7. Modalities (Heat, ice, etc.) 8. Invasive techniques such as nerve blocks. Ms. Farrier has attained more than 50% relief of the pain from a series of diagnostic injections conducted in separate occasions.  Pain Score: Pre-procedure: 7 /10 Post-procedure: 0-No pain/10    Position / Prep / Materials:  Position: Prone  Prep solution: DuraPrep (Iodine Povacrylex [0.7% available iodine] and Isopropyl Alcohol, 74% w/w) Prep Area: Entire Lumbosacral Region (Lower back from mid-thoracic region to end of tailbone and from flank to flank.) Materials:  Tray: RFA (Radiofrequency) tray Needle(s):  Type: RFA (Teflon-coated radiofrequency ablation needles) Gauge (G): 22  Length: Regular (10cm) Qty: 5  Pre-op H&P Assessment:  Karen Edwards is a 56 y.o. (year old), female patient, seen today for interventional treatment. She  has a past surgical history that includes Cesarean section; Ankle surgery (Right, 03/02/2014); Hemorrhoid surgery (N/A, 06/16/2015); Cervical spine surgery (02/21/2016); Cesarean section with bilateral tubal ligation (1990); Colonoscopy (04/19/2015); Hysteroscopy with D & C (12/24/2013); Colonoscopy with propofol (N/A, 05/18/2018); Esophagogastroduodenoscopy (egd) with propofol (N/A, 05/18/2018); Spinal cord stimulator insertion (N/A, 06/18/2018); Tubal ligation; and Lumbar spinal cord simulator revision (N/A, 03/14/2020). Karen Edwards has a current medication list which includes the following prescription(s): acetaminophen, fluoxetine, hydrocodone-acetaminophen, [START ON 05/28/2022] hydrocodone-acetaminophen, ondansetron, oxycodone-acetaminophen, scopolamine, tizanidine, trazodone, oxycodone, and pregabalin, and the following Facility-Administered Medications: fentanyl. Her primarily concern today is the No chief complaint on file.  Initial Vital Signs:  Pulse/HCG  Rate: 70ECG Heart Rate: 61 (nsr) Temp: (!) 97.3 F (36.3 C) Resp: 15 BP: 122/76 SpO2: 100 %  BMI: Estimated body mass index is 28.34 kg/m as calculated from the following:   Height as of this encounter: '5\' 1"'$  (1.549 m).   Weight as of this  encounter: 150 lb (68 kg).  Risk Assessment: Allergies: Reviewed. She is allergic to gabapentin.  Allergy Precautions: None required Coagulopathies: Reviewed. None identified.  Blood-thinner therapy: None at this time Active Infection(s): Reviewed. None identified. Karen Edwards is afebrile  Site Confirmation: Karen Edwards was asked to confirm the procedure and laterality before marking the site Procedure checklist: Completed Consent: Before the procedure and under the influence of no sedative(s), amnesic(s), or anxiolytics, the patient was informed of the treatment options, risks and possible complications. To fulfill our ethical and legal obligations, as recommended by the American Medical Association's Code of Ethics, I have informed the patient of my clinical impression; the nature and purpose of the treatment or procedure; the risks, benefits, and possible complications of the intervention; the alternatives, including doing nothing; the risk(s) and benefit(s) of the alternative treatment(s) or procedure(s); and the risk(s) and benefit(s) of doing nothing. The patient was provided information about the general risks and possible complications associated with the procedure. These may include, but are not limited to: failure to achieve desired goals, infection, bleeding, organ or nerve damage, allergic reactions, paralysis, and death. In addition, the patient was informed of those risks and complications associated to Spine-related procedures, such as failure to decrease pain; infection (i.e.: Meningitis, epidural or intraspinal abscess); bleeding (i.e.: epidural hematoma, subarachnoid hemorrhage, or any other type of intraspinal or peri-dural bleeding);  organ or nerve damage (i.e.: Any type of peripheral nerve, nerve root, or spinal cord injury) with subsequent damage to sensory, motor, and/or autonomic systems, resulting in permanent pain, numbness, and/or weakness of one or several areas of the body; allergic reactions; (i.e.: anaphylactic reaction); and/or death. Furthermore, the patient was informed of those risks and complications associated with the medications. These include, but are not limited to: allergic reactions (i.e.: anaphylactic or anaphylactoid reaction(s)); adrenal axis suppression; blood sugar elevation that in diabetics may result in ketoacidosis or comma; water retention that in patients with history of congestive heart failure may result in shortness of breath, pulmonary edema, and decompensation with resultant heart failure; weight gain; swelling or edema; medication-induced neural toxicity; particulate matter embolism and blood vessel occlusion with resultant organ, and/or nervous system infarction; and/or aseptic necrosis of one or more joints. Finally, the patient was informed that Medicine is not an exact science; therefore, there is also the possibility of unforeseen or unpredictable risks and/or possible complications that may result in a catastrophic outcome. The patient indicated having understood very clearly. We have given the patient no guarantees and we have made no promises. Enough time was given to the patient to ask questions, all of which were answered to the patient's satisfaction. Ms. Buhrman has indicated that she wanted to continue with the procedure. Attestation: I, the ordering provider, attest that I have discussed with the patient the benefits, risks, side-effects, alternatives, likelihood of achieving goals, and potential problems during recovery for the procedure that I have provided informed consent. Date  Time: 05/21/2022  8:40 AM  Pre-Procedure Preparation:  Monitoring: As per clinic protocol. Respiration,  ETCO2, SpO2, BP, heart rate and rhythm monitor placed and checked for adequate function Safety Precautions: Patient was assessed for positional comfort and pressure points before starting the procedure. Time-out: I initiated and conducted the "Time-out" before starting the procedure, as per protocol. The patient was asked to participate by confirming the accuracy of the "Time Out" information. Verification of the correct person, site, and procedure were performed and confirmed by me, the nursing staff, and the patient. "Time-out" conducted  as per Joint Commission's Universal Protocol (UP.01.01.01). Time: 0937  Description of Procedure:          Laterality: Right Levels:  L2, L3, L4, L5, & S1 Medial Branch Level(s). Safety Precautions: Aspiration looking for blood return was conducted prior to all injections. At no point did we inject any substances, as a needle was being advanced. Before injecting, the patient was told to immediately notify me if she was experiencing any new onset of "ringing in the ears, or metallic taste in the mouth". No attempts were made at seeking any paresthesias. Safe injection practices and needle disposal techniques used. Medications properly checked for expiration dates. SDV (single dose vial) medications used. After the completion of the procedure, all disposable equipment used was discarded in the proper designated medical waste containers. Local Anesthesia: Protocol guidelines were followed. The patient was positioned over the fluoroscopy table. The area was prepped in the usual manner. The time-out was completed. The target area was identified using fluoroscopy. A 12-in long, straight, sterile hemostat was used with fluoroscopic guidance to locate the targets for each level blocked. Once located, the skin was marked with an approved surgical skin marker. Once all sites were marked, the skin (epidermis, dermis, and hypodermis), as well as deeper tissues (fat, connective tissue  and muscle) were infiltrated with a small amount of a short-acting local anesthetic, loaded on a 10cc syringe with a 25G, 1.5-in  Needle. An appropriate amount of time was allowed for local anesthetics to take effect before proceeding to the next step. Technical description of process:  Radiofrequency Ablation (RFA) L2 Medial Branch Nerve RFA: The target area for the L2 medial branch is at the junction of the postero-lateral aspect of the superior articular process and the superior, posterior, and medial edge of the transverse process of L3. Under fluoroscopic guidance, a Radiofrequency needle was inserted until contact was made with os over the superior postero-lateral aspect of the pedicular shadow (target area). Sensory and motor testing was conducted to properly adjust the position of the needle. Once satisfactory placement of the needle was achieved, the numbing solution was slowly injected after negative aspiration for blood. 2.0 mL of the nerve block solution was injected without difficulty or complication. After waiting for at least 3 minutes, the ablation was performed. Once completed, the needle was removed intact. L3 Medial Branch Nerve RFA: The target area for the L3 medial branch is at the junction of the postero-lateral aspect of the superior articular process and the superior, posterior, and medial edge of the transverse process of L4. Under fluoroscopic guidance, a Radiofrequency needle was inserted until contact was made with os over the superior postero-lateral aspect of the pedicular shadow (target area). Sensory and motor testing was conducted to properly adjust the position of the needle. Once satisfactory placement of the needle was achieved, the numbing solution was slowly injected after negative aspiration for blood. 2.0 mL of the nerve block solution was injected without difficulty or complication. After waiting for at least 3 minutes, the ablation was performed. Once completed, the  needle was removed intact. L4 Medial Branch Nerve RFA: The target area for the L4 medial branch is at the junction of the postero-lateral aspect of the superior articular process and the superior, posterior, and medial edge of the transverse process of L5. Under fluoroscopic guidance, a Radiofrequency needle was inserted until contact was made with os over the superior postero-lateral aspect of the pedicular shadow (target area). Sensory and motor testing was conducted to  properly adjust the position of the needle. Once satisfactory placement of the needle was achieved, the numbing solution was slowly injected after negative aspiration for blood. 2.0 mL of the nerve block solution was injected without difficulty or complication. After waiting for at least 3 minutes, the ablation was performed. Once completed, the needle was removed intact. L5 Medial Branch Nerve RFA: The target area for the L5 medial branch is at the junction of the postero-lateral aspect of the superior articular process of S1 and the superior, posterior, and medial edge of the sacral ala. Under fluoroscopic guidance, a Radiofrequency needle was inserted until contact was made with os over the superior postero-lateral aspect of the pedicular shadow (target area). Sensory and motor testing was conducted to properly adjust the position of the needle. Once satisfactory placement of the needle was achieved, the numbing solution was slowly injected after negative aspiration for blood. 2.0 mL of the nerve block solution was injected without difficulty or complication. After waiting for at least 3 minutes, the ablation was performed. Once completed, the needle was removed intact. S1 Medial Branch Nerve RFA: The target area for the S1 medial branch is located inferior to the junction of the S1 superior articular process and the L5 inferior articular process, posterior, inferior, and lateral to the 6 o'clock position of the L5-S1 facet joint, just  superior to the S1 posterior foramen. Under fluoroscopic guidance, the Radiofrequency needle was advanced until contact was made with os over the Target area. Sensory and motor testing was conducted to properly adjust the position of the needle. Once satisfactory placement of the needle was achieved, the numbing solution was slowly injected after negative aspiration for blood. 2.0 mL of the nerve block solution was injected without difficulty or complication. After waiting for at least 3 minutes, the ablation was performed. Once completed, the needle was removed intact. Radiofrequency lesioning (ablation):  Radiofrequency Generator: Medtronic AccurianTM AG 1000 RF Generator Sensory Stimulation Parameters: 50 Hz was used to locate & identify the nerve, making sure that the needle was positioned such that there was no sensory stimulation below 0.3 V or above 0.7 V. Motor Stimulation Parameters: 2 Hz was used to evaluate the motor component. Care was taken not to lesion any nerves that demonstrated motor stimulation of the lower extremities at an output of less than 2.5 times that of the sensory threshold, or a maximum of 2.0 V. Lesioning Technique Parameters: Standard Radiofrequency settings. (Not bipolar or pulsed.) Temperature Settings: 80 degrees C Lesioning time: 60 seconds Intra-operative Compliance: Compliant  Once the entire procedure was completed, the treated area was cleaned, making sure to leave some of the prepping solution back to take advantage of its long term bactericidal properties.    Illustration of the posterior view of the lumbar spine and the posterior neural structures. Laminae of L2 through S1 are labeled. DPRL5, dorsal primary ramus of L5; DPRS1, dorsal primary ramus of S1; DPR3, dorsal primary ramus of L3; FJ, facet (zygapophyseal) joint L3-L4; I, inferior articular process of L4; LB1, lateral branch of dorsal primary ramus of L1; IAB, inferior articular branches from L3 medial  branch (supplies L4-L5 facet joint); IBP, intermediate branch plexus; MB3, medial branch of dorsal primary ramus of L3; NR3, third lumbar nerve root; S, superior articular process of L5; SAB, superior articular branches from L4 (supplies L4-5 facet joint also); TP3, transverse process of L3.  Vitals:   05/21/22 1007 05/21/22 1014 05/21/22 1024 05/21/22 1035  BP: 136/81 119/65 127/68 (!) 147/75  Pulse:      Resp: '18 16 14 15  '$ Temp:  (!) 97.3 F (36.3 C)  (!) 97.1 F (36.2 C)  SpO2: 95% 97% 96% 96%  Weight:      Height:       Start Time: 0937 hrs. End Time: 1007 hrs.  Imaging Guidance (Spinal):          Type of Imaging Technique: Fluoroscopy Guidance (Spinal) Indication(s): Assistance in needle guidance and placement for procedures requiring needle placement in or near specific anatomical locations not easily accessible without such assistance. Exposure Time: Please see nurses notes. Contrast: None used. Fluoroscopic Guidance: I was personally present during the use of fluoroscopy. "Tunnel Vision Technique" used to obtain the best possible view of the target area. Parallax error corrected before commencing the procedure. "Direction-depth-direction" technique used to introduce the needle under continuous pulsed fluoroscopy. Once target was reached, antero-posterior, oblique, and lateral fluoroscopic projection used confirm needle placement in all planes. Images permanently stored in EMR. Interpretation: No contrast injected. I personally interpreted the imaging intraoperatively. Adequate needle placement confirmed in multiple planes. Permanent images saved into the patient's record.  Antibiotic Prophylaxis:   Anti-infectives (From admission, onward)    None      Indication(s): None identified  Post-operative Assessment:  Post-procedure Vital Signs:  Pulse/HCG Rate: 7074 Temp: (!) 97.1 F (36.2 C) Resp: 15 BP: (!) 147/75 SpO2: 96 %  EBL: None  Complications: No immediate  post-treatment complications observed by team, or reported by patient.  Note: The patient tolerated the entire procedure well. A repeat set of vitals were taken after the procedure and the patient was kept under observation following institutional policy, for this type of procedure. Post-procedural neurological assessment was performed, showing return to baseline, prior to discharge. The patient was provided with post-procedure discharge instructions, including a section on how to identify potential problems. Should any problems arise concerning this procedure, the patient was given instructions to immediately contact us, at any time, without hesitation. In any case, we plan to contact the patient by telephone for a follow-up status report regarding this interventional procedure.  Comments:  No additional relevant information.  Plan of Care  Orders:  Orders Placed This Encounter  Procedures   Radiofrequency,Lumbar    Scheduling Instructions:     Side(s): Right-sided     Level: L3-4, L4-5, & L5-S1 Facets (L2, L3, L4, L5, & S1 Medial Branch Nerves)     Sedation: With Sedation.     Timeframe: Today    Order Specific Question:   Where will this procedure be performed?    Answer:   [facility]   Radiofrequency,Lumbar    Standing Status:   Future    Standing Expiration Date:   08/21/2022    Scheduling Instructions:     Side(s): Left-sided     Level: L3-4, L4-5, & L5-S1 Facets (L2, L3, L4, L5, & S1 Medial Branch Nerves)     Sedation: With Sedation.     Scheduling Timeframe: 2 weeks from now    Order Specific Question:   Where will this procedure be performed?    Answer:   [facility]   DG PAIN CLINIC C-ARM 1-60 MIN NO REPORT    Intraoperative interpretation by procedural physician at [facility].    Standing Status:   Standing    Number of Occurrences:   1    Order Specific Question:   Reason for exam:    Answer:   Assistance in needle  guidance and placement for  procedures requiring needle placement in or near specific anatomical locations not easily accessible without such assistance.   Informed Consent Details: Physician/Practitioner Attestation; Transcribe to consent form and obtain patient signature    Nursing Order: Transcribe to consent form and obtain patient signature. Note: Always confirm laterality of pain with Ms. Tye Savoy, before procedure.    Order Specific Question:   Physician/Practitioner attestation of informed consent for procedure/surgical case    Answer:   I, the physician/practitioner, attest that I have discussed with the patient the benefits, risks, side effects, alternatives, likelihood of achieving goals and potential problems during recovery for the procedure that I have provided informed consent.    Order Specific Question:   Procedure    Answer:   Lumbar Facet Radiofrequency Ablation    Order Specific Question:   Physician/Practitioner performing the procedure    Answer:   Franci Oshana A. 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   Provide equipment / supplies at bedside    "Radiofrequency Tray"; Large hemostat (x1); Small hemostat (x1); Towels (x8); 4x4 sterile sponge pack (x1) Needle type: Teflon-coated Radiofrequency Needle (Disposable  single use) Size: Regular Quantity: 5    Standing Status:   Standing    Number of Occurrences:   1    Order Specific Question:   Specify    Answer:   Radiofrequency Tray   Chronic Opioid Analgesic:  Oxycodone IR 5 mg, 1 tab PO QD (5 mg/day of oxycodone) PRN MME/day: 7.5 mg/day.   Medications ordered for procedure: Meds ordered this encounter  Medications   lidocaine (XYLOCAINE) 2 % (with pres) injection 400 mg    lactated ringers infusion   midazolam (VERSED) 5 MG/5ML injection 0.5-2 mg    Make sure Flumazenil is available in the pyxis when using this medication. If oversedation occurs, administer 0.2 mg IV over 15 sec. If after 45 sec no response, administer 0.2 mg again over 1 min; may repeat at 1 min intervals; not to exceed 4 doses (1 mg)   fentaNYL (SUBLIMAZE) injection 25-50 mcg    Make sure Narcan is available in the pyxis when using this medication. In the event of respiratory depression (RR< 8/min): Titrate NARCAN (naloxone) in increments of 0.1 to 0.2 mg IV at 2-3 minute intervals, until desired degree of reversal.   ropivacaine (PF) 2 mg/mL (0.2%) (NAROPIN) injection 9 mL   triamcinolone acetonide (KENALOG-40) injection 40 mg   HYDROcodone-acetaminophen (NORCO/VICODIN) 5-325 MG tablet    Sig: Take 1 tablet by mouth every 8 (eight) hours as needed for up to 7 days for severe pain. Must last 7 days.    Dispense:  21 tablet    Refill:  0    For acute post-operative pain. Not to be refilled. Must last 7 days.   HYDROcodone-acetaminophen (NORCO/VICODIN) 5-325 MG tablet    Sig: Take 1 tablet by mouth every 8 (eight) hours as needed for up to 7 days for severe pain. Must last for 7 days.    Dispense:  21 tablet    Refill:  0    For acute post-operative pain. Not to be refilled. Must last 7 days.   oxyCODONE (OXY IR/ROXICODONE) 5 MG immediate release tablet  Sig: Take 1 tablet (5 mg total) by mouth 2 (two) times daily as needed for severe pain. Must last 30 days.    Dispense:  40 tablet    Refill:  0    DO NOT: delete (not duplicate); no partial-fill (will deny script to complete), no refill request (F/U required). DISPENSE: 1 day early if closed on fill date. WARN: No CNS-depressants within 8 hrs of med.   pregabalin (LYRICA) 25 MG capsule    Sig: Take 1 capsule (25 mg total) by mouth 3 (three) times daily.    Dispense:  270 capsule    Refill:  0    Fill one day early if pharmacy is closed on  scheduled refill date. May substitute for generic if available.   Medications administered: We administered lidocaine, lactated ringers, midazolam, fentaNYL, ropivacaine (PF) 2 mg/mL (0.2%), and triamcinolone acetonide.  See the medical record for exact dosing, route, and time of administration.  Follow-up plan:   Return in about 2 weeks (around 06/04/2022) for (57mn), procedure (ECT): (L) L-FCT RFA #3.       Interventional Therapies  Risk  Complexity Considerations:   Estimated body mass index is 27.07 kg/m as calculated from the following:   Height as of this encounter: '5\' 2"'$  (1.575 m).   Weight as of this encounter: 148 lb (67.1 kg). WNL   Planned  Pending:   Therapeutic bilateral lumbar facet RFA #3 (Starting w/ right side)    Under consideration:   Therapeutic right L1-2 & L3-4 percutaneous discectomy ([employer] compressor)  Therapeutic intrathecal pump trial    Completed:   Therapeutic right cervical ESI x1 (08/24/2020) (100/100/50/50)  Diagnostic bilateral GONB x1 (08/19/2019) (100/100/100/,50)  Palliative right cervical facet MBB x3 (07/23/2018) (100/100/100/>50)  Palliative left cervical facet MBB x4 (04/01/2019) (90/90/0/0)  Palliative right cervical facet RFA x1 (09/17/2018) (100/100/85/85)  Palliative left cervical facet RFA x1 (05/04/2019) (100/100/50/50)  Therapeutic left L1-2 LESI x1 (05/10/2021) (100/100/100 x 2 days/0)  Therapeutic right L1-2 LESI x2 (02/10/2018) (100/100/70/50-75)  Therapeutic right L1 TFESI x1 (02/10/2018) (100/100/70/50-75)  Diagnostic/therapeutic left superior cluneal NB (L2, L3 dorsal rami) x1 (04/24/2021) (0/0/0/0)  Diagnostic/therapeutic left L1 TFESI x2 (11/15/2021) (100/100/0)  Diagnostic/therapeutic left L3 TFESI x2 (11/15/2021) (100/100/0)  Bilateral lumbar spinal cord stimulator trial (done) (05/14/2018) (by me)  Bilateral lumbar spinal cord stimulator implant (09/28/2019) (by me)  Bilateral spinal cord stimulator revision (03/14/2020) (by me)   Diagnostic bilateral lumbar facet MBB x3 (11/30/2019) (100/100/50/20)  Diagnostic bilateral SI joint Blk x2 (09/18/2021) (100/100/100)  Palliative left lumbar facet RFA x2 (03/27/2021) (100/100/50)  Therapeutic right lumbar facet RFA x2 (02/01/2021) (100/100/50/50)    Therapeutic  Palliative (PRN) options:   Palliative right cervical facet RFA   Diagnostic bilateral SI joint Blk   Therapeutic bilateral lumbar facet RFA      Recent Visits Date Type Provider Dept  04/09/22 Office Visit NMilinda Pointer MD [facility]  03/26/22 Procedure visit NMilinda Pointer MD [facility]  Showing recent visits within past 90 days and meeting all other requirements Today's Visits Date Type Provider Dept  05/21/22 Procedure visit NMilinda Pointer MD [facility]  Showing today's visits and meeting all other requirements Future Appointments Date Type Provider Dept  05/29/22 Appointment NMilinda Pointer MD [facility]  Showing future appointments within next 90 days and meeting all other requirements  Disposition: Discharge home  Discharge (Date  Time): 05/21/2022; 1035 hrs.   Primary Care Physician: [facility]leas Koch [facility] Note by: Gaspar Cola, MD Date: 05/21/2022; Time: 11:17 AM  Disclaimer:  Medicine is not an Chief Strategy Officer. The only guarantee in medicine is that nothing is guaranteed. It is important to note that the decision to proceed with this intervention was based on the information collected from the patient. The Data and conclusions were drawn from the patient's questionnaire, the interview, and the physical examination. Because the information was provided in large part by the patient, it cannot be guaranteed that it has not been purposely or unconsciously manipulated. Every effort has been made to obtain as much relevant data as possible for this evaluation. It is important to note  that the conclusions that lead to this procedure are derived in large part from the available data. Always take into account that the treatment will also be dependent on availability of resources and existing treatment guidelines, considered by other Pain Management Practitioners as being common knowledge and practice, at the time of the intervention. For Medico-Legal purposes, it is also important to point out that variation in procedural techniques and pharmacological choices are the acceptable norm. The indications, contraindications, technique, and results of the above procedure should only be interpreted and judged by a Board-Certified Interventional Pain Specialist with extensive familiarity and expertise in the same exact procedure and technique.

## 2022-05-21 ENCOUNTER — Ambulatory Visit
Admission: RE | Admit: 2022-05-21 | Discharge: 2022-05-21 | Disposition: A | Discharge status: Home / Self Care | Payer: Medicare PPO | Source: Ambulatory Visit | Attending: Pain Medicine | Admitting: Pain Medicine

## 2022-05-21 ENCOUNTER — Ambulatory Visit: Payer: Medicare PPO | Attending: Pain Medicine | Admitting: Pain Medicine

## 2022-05-21 ENCOUNTER — Encounter: Payer: Self-pay | Admitting: Pain Medicine

## 2022-05-21 VITALS — BP 147/75 | HR 70 | Temp 97.1°F | Resp 15 | Ht 61.0 in | Wt 150.0 lb

## 2022-05-21 DIAGNOSIS — M79605 Pain in left leg: Secondary | ICD-10-CM | POA: Insufficient documentation

## 2022-05-21 DIAGNOSIS — M792 Neuralgia and neuritis, unspecified: Secondary | ICD-10-CM | POA: Insufficient documentation

## 2022-05-21 DIAGNOSIS — M5481 Occipital neuralgia: Secondary | ICD-10-CM | POA: Insufficient documentation

## 2022-05-21 DIAGNOSIS — M5441 Lumbago with sciatica, right side: Secondary | ICD-10-CM | POA: Diagnosis present

## 2022-05-21 DIAGNOSIS — M25551 Pain in right hip: Secondary | ICD-10-CM | POA: Insufficient documentation

## 2022-05-21 DIAGNOSIS — Z79891 Long term (current) use of opiate analgesic: Secondary | ICD-10-CM | POA: Diagnosis present

## 2022-05-21 DIAGNOSIS — G8918 Other acute postprocedural pain: Secondary | ICD-10-CM | POA: Diagnosis not present

## 2022-05-21 DIAGNOSIS — M5137 Other intervertebral disc degeneration, lumbosacral region: Secondary | ICD-10-CM | POA: Insufficient documentation

## 2022-05-21 DIAGNOSIS — Z79899 Other long term (current) drug therapy: Secondary | ICD-10-CM | POA: Diagnosis present

## 2022-05-21 DIAGNOSIS — M79604 Pain in right leg: Secondary | ICD-10-CM | POA: Insufficient documentation

## 2022-05-21 DIAGNOSIS — M47816 Spondylosis without myelopathy or radiculopathy, lumbar region: Secondary | ICD-10-CM | POA: Diagnosis not present

## 2022-05-21 DIAGNOSIS — M542 Cervicalgia: Secondary | ICD-10-CM | POA: Insufficient documentation

## 2022-05-21 DIAGNOSIS — M47817 Spondylosis without myelopathy or radiculopathy, lumbosacral region: Secondary | ICD-10-CM | POA: Insufficient documentation

## 2022-05-21 DIAGNOSIS — M545 Low back pain, unspecified: Secondary | ICD-10-CM | POA: Insufficient documentation

## 2022-05-21 DIAGNOSIS — G894 Chronic pain syndrome: Secondary | ICD-10-CM | POA: Insufficient documentation

## 2022-05-21 DIAGNOSIS — G8929 Other chronic pain: Secondary | ICD-10-CM | POA: Diagnosis not present

## 2022-05-21 DIAGNOSIS — M25552 Pain in left hip: Secondary | ICD-10-CM | POA: Insufficient documentation

## 2022-05-21 DIAGNOSIS — M5442 Lumbago with sciatica, left side: Secondary | ICD-10-CM | POA: Insufficient documentation

## 2022-05-21 MED ORDER — ROPIVACAINE HCL 2 MG/ML IJ SOLN
9.0000 mL | Freq: Once | INTRAMUSCULAR | Status: AC
  Administered 2022-05-21: 9 mL via PERINEURAL

## 2022-05-21 MED ORDER — ROPIVACAINE HCL 2 MG/ML IJ SOLN
INTRAMUSCULAR | Status: AC
  Filled 2022-05-21: qty 20

## 2022-05-21 MED ORDER — LIDOCAINE HCL 2 % IJ SOLN
INTRAMUSCULAR | Status: AC
  Filled 2022-05-21: qty 20

## 2022-05-21 MED ORDER — TRIAMCINOLONE ACETONIDE 40 MG/ML IJ SUSP
40.0000 mg | Freq: Once | INTRAMUSCULAR | Status: AC
  Administered 2022-05-21: 40 mg

## 2022-05-21 MED ORDER — OXYCODONE HCL 5 MG PO TABS: 5.0000 mg | ORAL_TABLET | Freq: Two times a day (BID) | ORAL | 0 refills | Status: DC | PRN

## 2022-05-21 MED ORDER — MIDAZOLAM HCL 5 MG/5ML IJ SOLN
0.5000 mg | Freq: Once | INTRAMUSCULAR | Status: AC
  Administered 2022-05-21: 2.5 mg via INTRAVENOUS

## 2022-05-21 MED ORDER — PREGABALIN 25 MG PO CAPS
25.0000 mg | ORAL_CAPSULE | Freq: Three times a day (TID) | ORAL | 0 refills | Status: DC
Start: 2022-05-21 — End: 2022-08-08

## 2022-05-21 MED ORDER — LIDOCAINE HCL 2 % IJ SOLN
20.0000 mL | Freq: Once | INTRAMUSCULAR | Status: AC
  Administered 2022-05-21: 400 mg

## 2022-05-21 MED ORDER — HYDROCODONE-ACETAMINOPHEN 5-325 MG PO TABS: 1.0000 | ORAL_TABLET | Freq: Three times a day (TID) | ORAL | 0 refills | Status: AC | PRN

## 2022-05-21 MED ORDER — TRIAMCINOLONE ACETONIDE 40 MG/ML IJ SUSP
INTRAMUSCULAR | Status: AC
  Filled 2022-05-21: qty 1

## 2022-05-21 MED ORDER — LACTATED RINGERS IV SOLN: Freq: Once | INTRAVENOUS | Status: AC

## 2022-05-21 MED ORDER — HYDROCODONE-ACETAMINOPHEN 5-325 MG PO TABS: 1.0000 | ORAL_TABLET | Freq: Three times a day (TID) | ORAL | 0 refills | Status: DC | PRN

## 2022-05-21 MED ORDER — FENTANYL CITRATE (PF) 100 MCG/2ML IJ SOLN
25.0000 ug | INTRAMUSCULAR | Status: DC | PRN
  Administered 2022-05-21: 50 ug via INTRAVENOUS

## 2022-05-21 MED ORDER — MIDAZOLAM HCL 5 MG/5ML IJ SOLN
INTRAMUSCULAR | Status: AC
  Filled 2022-05-21: qty 5

## 2022-05-21 MED ORDER — FENTANYL CITRATE (PF) 100 MCG/2ML IJ SOLN
INTRAMUSCULAR | Status: AC
  Filled 2022-05-21: qty 2

## 2022-05-21 NOTE — Progress Notes (Signed)
Nursing Pain Medication Assessment:  Safety precautions to be maintained throughout the outpatient stay will include: orient to surroundings, keep bed in low position, maintain call bell within reach at all times, provide assistance with transfer out of bed and ambulation.  Medication Inspection Compliance: Pill count conducted under aseptic conditions, in front of the patient. Neither the pills nor the bottle was removed from the patient's sight at any time. Once count was completed pills were immediately returned to the patient in their original bottle.  Medication: Oxycodone IR Pill/Patch Count:  3 of 40 pills remain Pill/Patch Appearance: Markings consistent with prescribed medication Bottle Appearance: Standard pharmacy container. Clearly labeled. Filled Date: 01 / 19 / 2023 Last Medication intake:  Day before yesterdaySafety precautions to be maintained throughout the outpatient stay will include: orient to surroundings, keep bed in low position, maintain call bell within reach at all times, provide assistance with transfer out of bed and ambulation.

## 2022-05-21 NOTE — Addendum Note (Signed)
Addended by: Milinda Pointer A on: 05/21/2022 11:17 AM   Modules accepted: Orders

## 2022-05-21 NOTE — Patient Instructions (Addendum)
___________________________________________________________________________________________  Post-Radiofrequency (RF) Discharge Instructions  You have just completed a Radiofrequency Neurotomy.  The following instructions will provide you with information and guidelines for self-care upon discharge.  If at any time you have questions or concerns please call your physician. DO NOT DRIVE YOURSELF!!  Instructions: Apply ice: Fill a plastic sandwich bag with crushed ice. Cover it with a small towel and apply to injection site. Apply for 15 minutes then remove x 15 minutes. Repeat sequence on day of procedure, until you go to bed. The purpose is to minimize swelling and discomfort after procedure. Apply heat: Apply heat to procedure site starting the day following the procedure. The purpose is to treat any soreness and discomfort from the procedure. Food intake: No eating limitations, unless stipulated above.  Nevertheless, if you have had sedation, you may experience some nausea.  In this case, it may be wise to wait at least two hours prior to resuming regular diet. Physical activities: Keep activities to a minimum for the first 8 hours after the procedure. For the first 24 hours after the procedure, do not drive a motor vehicle,  Operate heavy machinery, power tools, or handle any weapons.  Consider walking with the use of an assistive device or accompanied by an adult for the first 24 hours.  Do not drink alcoholic beverages including beer.  Do not make any important decisions or sign any legal documents. Go home and rest today.  Resume activities tomorrow, as tolerated.  Use caution in moving about as you may experience mild leg weakness.  Use caution in cooking, use of household electrical appliances and climbing steps. Driving: If you have received any sedation, you are not allowed to drive for 24 hours after your procedure. Blood thinner: Restart your blood thinner 6 hours after your procedure. (Only  for those taking blood thinners) Insulin: As soon as you can eat, you may resume your normal dosing schedule. (Only for those taking insulin) Medications: May resume pre-procedure medications.  Do not take any drugs, other than what has been prescribed to you. Infection prevention: Keep procedure site clean and dry. Post-procedure Pain Diary: Extremely important that this be done correctly and accurately. Recorded information will be used to determine the next step in treatment. Pain evaluated is that of treated area only. Do not include pain from an untreated area. Complete every hour, on the hour, for the initial 8 hours. Set an alarm to help you do this part accurately. Do not go to sleep and have it completed later. It will not be accurate. Follow-up appointment: Keep your follow-up appointment after the procedure. Usually 2-6 weeks after radiofrequency. Bring you pain diary. The information collected will be essential for your long-term care.   Expect: From numbing medicine (AKA: Local Anesthetics): Numbness or decrease in pain. Onset: Full effect within 15 minutes of injected. Duration: It will depend on the type of local anesthetic used. On the average, 1 to 8 hours.  From steroids (when added): Decrease in swelling or inflammation. Once inflammation is improved, relief of the pain will follow. Onset of benefits: Depends on the amount of swelling present. The more swelling, the longer it will take for the benefits to be seen. In some cases, up to 10 days. Duration: Steroids will stay in the system x 2 weeks. Duration of benefits will depend on multiple posibilities including persistent irritating factors. From procedure: Some discomfort is to be expected once the numbing medicine wears off. In the case of radiofrequency procedures,  this may last as long as 6 weeks. Additional post-procedure pain medication is provided for this. Discomfort is minimized if ice and heat are applied as  instructed.  Call if: You experience numbness and weakness that gets worse with time, as opposed to wearing off. He experience any unusual bleeding, difficulty breathing, or loss of the ability to control your bowel and bladder. (This applies to Spinal procedures only) You experience any redness, swelling, heat, red streaks, elevated temperature, fever, or any other signs of a possible infection.  Emergency Numbers: [facility] hours (Monday - Thursday, 8:00 AM - 4:00 PM) (Friday, 9:00 AM - 12:00 Noon): [phone] After hours: [phone] ____________________________________________________________________________________________ ____________________________________________________________________________________________  Medication Rules  Purpose: To inform patients, and their family members, of our rules and regulations.  Applies to: All patients receiving prescriptions (written or electronic).  Pharmacy of record: Pharmacy where electronic prescriptions will be sent. If written prescriptions are taken to a different pharmacy, please inform the nursing staff. The pharmacy listed in the electronic medical record should be the one where you would like electronic prescriptions to be sent.  Electronic prescriptions: In compliance with the [place] (STOP) Act of 2017 (Session Lanny Cramp [phone]), effective October 28, 2018, all controlled substances must be electronically prescribed. Calling prescriptions to the pharmacy will cease to exist.  Prescription refills: Only during scheduled appointments. Applies to all prescriptions.  NOTE: The following applies primarily to controlled substances (Opioid* Pain Medications).   Type of encounter (visit): For patients receiving controlled substances, face-to-face visits are required. (Not an option or up to the patient.)  Patient's responsibilities: Pain Pills: Bring all pain pills to every  appointment (except for procedure appointments). Pill Bottles: Bring pills in original pharmacy bottle. Always bring the newest bottle. Bring bottle, even if empty. Medication refills: You are responsible for knowing and keeping track of what medications you take and those you need refilled. The day before your appointment: write a list of all prescriptions that need to be refilled. The day of the appointment: give the list to the admitting nurse. Prescriptions will be written only during appointments. No prescriptions will be written on procedure days. If you forget a medication: it will not be "Called in", "Faxed", or "electronically sent". You will need to get another appointment to get these prescribed. No early refills. Do not call asking to have your prescription filled early. Prescription Accuracy: You are responsible for carefully inspecting your prescriptions before leaving our office. Have the discharge nurse carefully go over each prescription with you, before taking them home. Make sure that your name is accurately spelled, that your address is correct. Check the name and dose of your medication to make sure it is accurate. Check the number of pills, and the written instructions to make sure they are clear and accurate. Make sure that you are given enough medication to last until your next medication refill appointment. Taking Medication: Take medication as prescribed. When it comes to controlled substances, taking less pills or less frequently than prescribed is permitted and encouraged. Never take more pills than instructed. Never take medication more frequently than prescribed.  Inform other Doctors: Always inform, all of your healthcare providers, of all the medications you take. Pain Medication from other Providers: You are not allowed to accept any additional pain medication from any other Doctor or Healthcare provider. There are two exceptions to this rule. (see below) In the event  that you require additional pain medication, you are responsible  for notifying us, as stated below. Cough Medicine: Often these contain an opioid, such as codeine or hydrocodone. Never accept or take cough medicine containing these opioids if you are already taking an opioid* medication. The combination may cause respiratory failure and death. Medication Agreement: You are responsible for carefully reading and following our Medication Agreement. This must be signed before receiving any prescriptions from our practice. Safely store a copy of your signed Agreement. Violations to the Agreement will result in no further prescriptions. (Additional copies of our Medication Agreement are available upon request.) Laws, Rules, & Regulations: All patients are expected to follow all Federal and [employer], [employer], Rules, [employer] [facility]. Ignorance of the Laws does not constitute a valid excuse.  Illegal drugs and Controlled Substances: The use of illegal substances (including, but not limited to marijuana and its derivatives) and/or the illegal use of any controlled substances is strictly prohibited. Violation of this rule may result in the immediate and permanent discontinuation of any and all prescriptions being written by our practice. The use of any illegal substances is prohibited. Adopted CDC guidelines & recommendations: Target dosing levels will be at or below 60 MME/day. Use of benzodiazepines** is not recommended.  Exceptions: There are only two exceptions to the rule of not receiving pain medications from other Healthcare Providers. Exception #1 (Emergencies): In the event of an emergency (i.e.: accident requiring emergency care), you are allowed to receive additional pain medication. However, you are responsible for: As soon as you are able, call our office [phone], at any time of the day or night, and leave a message stating your name, the date and nature of the emergency, and the name and dose of  the medication prescribed. In the event that your call is answered by a member of our staff, make sure to document and save the date, time, and the name of the person that took your information.  Exception #2 (Planned Surgery): In the event that you are scheduled by another doctor or dentist to have any type of surgery or procedure, you are allowed (for a period no longer than 30 days), to receive additional pain medication, for the acute post-op pain. However, in this case, you are responsible for picking up a copy of our "Post-op Pain Management for Surgeons" handout, and giving it to your surgeon or dentist. This document is available at our office, and does not require an appointment to obtain it. Simply go to our office during business hours (Monday-Thursday from 8:00 AM to 4:00 PM) (Friday 8:00 AM to 12:00 Noon) or if you have a scheduled appointment with [place], prior to your surgery, and ask for it by name. In addition, you are responsible for: calling our office [phone], at any time of the day or night, and leaving a message stating your name, name of your surgeon, type of surgery, and date of procedure or surgery. Failure to comply with your responsibilities may result in termination of therapy involving the controlled substances. Medication Agreement Violation. Following the above rules, including your responsibilities will help you in avoiding a Medication Agreement Violation ("Breaking your Pain Medication Contract").  *Opioid medications include: morphine, codeine, oxycodone, oxymorphone, hydrocodone, hydromorphone, meperidine, tramadol, tapentadol, buprenorphine, fentanyl, methadone. **Benzodiazepine medications include: diazepam (Valium), alprazolam (Xanax), clonazepam (Klonopine), lorazepam (Ativan), clorazepate (Tranxene), chlordiazepoxide (Librium), estazolam (Prosom), oxazepam (Serax), temazepam (Restoril), triazolam (Halcion) (Last updated:  07/25/2021) ____________________________________________________________________________________________ ____________________________________________________________________________________________  Medication Recommendations and Reminders  Applies to: All patients receiving prescriptions (written and/or electronic).  Medication Rules &  Regulations: These rules and regulations exist for your safety and that of others. They are not flexible and neither are we. Dismissing or ignoring them will be considered "non-compliance" with medication therapy, resulting in complete and irreversible termination of such therapy. (See document titled "Medication Rules" for more details.) In all conscience, because of safety reasons, we cannot continue providing a therapy where the patient does not follow instructions.  Pharmacy of record:  Definition: This is the pharmacy where your electronic prescriptions will be sent.  We do not endorse any particular pharmacy, however, we have experienced problems with [facility] not securing enough medication supply for the community. We do not restrict you in your choice of pharmacy. However, once we write for your prescriptions, we will NOT be re-sending more prescriptions to fix restricted supply problems created by your pharmacy, or your insurance.  The pharmacy listed in the electronic medical record should be the one where you want electronic prescriptions to be sent. If you choose to change pharmacy, simply notify our nursing staff.  Recommendations: Keep all of your pain medications in a safe place, under lock and key, even if you live alone. We will NOT replace lost, stolen, or damaged medication. After you fill your prescription, take 1 week's worth of pills and put them away in a safe place. You should keep a separate, properly labeled bottle for this purpose. The remainder should be kept in the original bottle. Use this as your primary supply, until it runs out.  Once it's gone, then you know that you have 1 week's worth of medicine, and it is time to come in for a prescription refill. If you do this correctly, it is unlikely that you will ever run out of medicine. To make sure that the above recommendation works, it is very important that you make sure your medication refill appointments are scheduled at least 1 week before you run out of medicine. To do this in an effective manner, make sure that you do not leave the office without scheduling your next medication management appointment. Always ask the nursing staff to show you in your prescription , when your medication will be running out. Then arrange for the receptionist to get you a return appointment, at least 7 days before you run out of medicine. Do not wait until you have 1 or 2 pills left, to come in. This is very poor planning and does not take into consideration that we may need to cancel appointments due to bad weather, sickness, or emergencies affecting our staff. DO NOT ACCEPT A "Partial Fill": If for any reason your pharmacy does not have enough pills/tablets to completely fill or refill your prescription, do not allow for a "partial fill". The law allows the pharmacy to complete that prescription within 72 hours, without requiring a new prescription. If they do not fill the rest of your prescription within those 72 hours, you will need a separate prescription to fill the remaining amount, which we will NOT provide. If the reason for the partial fill is your insurance, you will need to talk to the pharmacist about payment alternatives for the remaining tablets, but again, DO NOT ACCEPT A PARTIAL FILL, unless you can trust your pharmacist to obtain the remainder of the pills within 72 hours.  Prescription refills and/or changes in medication(s):  Prescription refills, and/or changes in dose or medication, will be conducted only during scheduled medication management appointments. (Applies to both,  written and electronic prescriptions.) No refills on procedure days.  No medication will be changed or started on procedure days. No changes, adjustments, and/or refills will be conducted on a procedure day. Doing so will interfere with the diagnostic portion of the procedure. No phone refills. No medications will be "called into the pharmacy". No Fax refills. No weekend refills. No Holliday refills. No after hours refills.  Remember:  Business hours are:  Monday to Thursday 8:00 AM to 4:00 PM Provider's Schedule: Milinda Pointer, MD - Appointments are:  Medication management: Monday and Wednesday 8:00 AM to 4:00 PM Procedure day: Tuesday and Thursday 7:30 AM to 4:00 PM Gillis Santa, MD - Appointments are:  Medication management: Tuesday and Thursday 8:00 AM to 4:00 PM Procedure day: Monday and Wednesday 7:30 AM to 4:00 PM (Last update: 05/17/2020) ____________________________________________________________________________________________  ____________________________________________________________________________________________  Pharmacy Shortages of Pain Medication   Introduction Shockingly as it may seem, .  "No U.[place] Supreme Court decision has ever interpreted the Constitution as guaranteeing a right to health care for all Americans." - https://huff.com/  "With respect to human rights, the [place] has no formally codified right to health, nor does it participate in a human rights treaty that specifies a right to health." - Scott J. Schweikart, JD, MBE  Situation By now, most of our patients have had the experience of being told by their pharmacist that they do not have enough medication to cover their prescription. If you have not had this experience, just know that you soon will.  Problem There appears to be a shortage of these medications, either at the national level or locally. This is happening  with all pharmacies. When there is not enough medication, patients are offered a partial fill and they are told that they will try to get the rest of the medicine for them at a later time. If they do not have enough for even a partial fill, the pharmacists are telling the patients to call us (the prescribing physicians) to request that we send another prescription to another pharmacy to get the medicine.   This reordering of a controlled substance creates documentation problems where additional paperwork needs to be created to explain why two prescriptions for the same period of time and the same medicine are being prescribed to the same patient. It also creates situations where the last appointment note does not accurately reflect when and what prescriptions were given to a patient. This leads to prescribing errors down the line, in subsequent follow-up visits.   [employer] of Pharmacy ([employer]) Research revealed that Surveyor, quantity .1806 (21 NCAC 46.1806) authorizes pharmacists to the transfer of prescriptions among pharmacies, and it sets forth procedural and recordkeeping requirements for doing so. However, this requires the pharmacist to complete the previously mentioned procedural paperwork to accomplish the transfer. As it turns out, it is much easier for them to have the prescribing physicians do the work.   Possible solutions 1. Have the [facility] Assembly add a provision to the "STOP ACT" (the law that mandates how controlled substances are prescribed) where there is an exception to the electronic prescribing rule that states that in the event there are shortages of medications the physicians are allowed to use written prescriptions as opposed to electronic ones. This would allow patients to take their prescriptions to a different pharmacy that may have enough medication available to fill the prescription. The problem is that currently there is a law that does not allow  for written prescriptions, with the exception of instances where the  electronic medical record is down due to technical issues.  2. Have [place] Congress ease the pressure on pharmaceutical companies, allowing them to produce enough quantities of the medication to adequately supply the population. 3. Have pharmacies keep enough stocks of these medications to cover their client base.  4. Have the [facility] Assembly add a provision to the "STOP ACT" where they ease the regulations surrounding the transfer of controlled substances between pharmacies, so as to simplify the transfer of supplies. As an alternative, develop a system to allow patients to obtain the remainder of their prescription at another one of their pharmacies or at an associate pharmacy.   How this shortage will affect you.  The one thing that is abundantly clear is that this is a pharmacy supply problem  and not a prescriber problem. The job of the prescriber is to evaluate and monitor the patients for the appropriate indications to the use of these medicines, the monitoring of their use and the prescribing of the appropriate dose and regimen. It is not the job of the prescriber to provide or dispense the actual medication. By law, this is the job of the pharmacies and pharmacists. It is certainly not the job of the prescriber to solve the supply problems.   Due to the above problems we are no longer taking patients to write for their pain medication. We will continue to evaluate for appropriate indications and we may provide recommendations regarding medication, dose, and schedule, as well as monitoring recommendations, however, we will not be taking over the actual prescribing of these substances. On those patients where we are treating their chronic pain with interventional therapies, exceptions will be considered on a case by case basis. At this time, we will try to continue providing this supplemental service to those patients we  have been managing in the past. However, as of August 1st, 2023, we no longer will be sending additional prescriptions to other pharmacies for the purpose of solving their supply problems. Once we send a prescription to a pharmacy, we will not be resending it again to another pharmacy to cover for their shortages.   What to do. Write as many letters as you can. Recruit the help of family members in writing these letters. Below are some of the places where you can write to make your voice heard. Let them know what the problem is and push them to look for solutions.   Search internet for: "[employer] find your legislators" NoseSwap.is  Search internet for: "[employer] commissioner complaints" Starlas.fi  Search internet for: "[employer] complaints" https://www.hernandez-brewer.com/.htm  Search internet for: "[facility] pharmacy complaints" Email [facility] Customer Relations woondaal.com.jsp?callType=store  Search internet for: Programme researcher, broadcasting/film/video customer service complaints" https://www.[facility].com/topic/marketing/contactus/contactus_customerservice.jsp  ____________________________________________________________________________________________  ____________________________________________________________________________________________  CBD (cannabidiol) & Delta-8 (Delta-8 tetrahydrocannabinol) WARNING  Intro: Cannabidiol (CBD) and tetrahydrocannabinol (THC), are two natural compounds found in plants of the Cannabis genus. They can both be extracted from hemp or cannabis. Hemp and cannabis come from the Cannabis sativa plant. Both compounds interact with your body's endocannabinoid system, but they have very different effects. CBD does not produce the high sensation associated with cannabis. Delta-8 tetrahydrocannabinol, also known as  delta-8 THC, is a psychoactive substance found in the Cannabis sativa plant, of which marijuana and hemp are two varieties. THC is responsible for the high associated with the illicit use of marijuana.  Applicable to: All individuals currently taking or considering taking CBD (cannabidiol) and, more important, all patients  taking opioid analgesic controlled substances (pain medication). (Example: oxycodone; oxymorphone; hydrocodone; hydromorphone; morphine; methadone; tramadol; tapentadol; fentanyl; buprenorphine; butorphanol; dextromethorphan; meperidine; codeine; etc.)  Legal status: CBD remains a Schedule I drug prohibited for any use. CBD is illegal with one exception. In the [place], CBD has a limited Transport planner (FDA) approval for the treatment of two specific types of epilepsy disorders. Only one CBD product has been approved by the FDA for this purpose: "Epidiolex". FDA is aware that some companies are marketing products containing cannabis and cannabis-derived compounds in ways that violate the [employer], Drug and Cosmetic Act [facility] Act) and that may put the health and safety of consumers at risk. The FDA, a Federal agency, has not enforced the CBD status since 2018. UPDATE: (12/14/2021) The Drug Enforcement Agency ([place]) issued a letter stating that "delta" cannabinoids, including Delta-8-THCO and Delta-9-THCO, synthetically derived from hemp do not qualify as hemp and will be viewed as Schedule I drugs. (Schedule I drugs, substances, or chemicals are defined as drugs with no currently accepted medical use and a high potential for abuse. Some examples of Schedule I drugs are: heroin, lysergic acid diethylamide (LSD), marijuana (cannabis), 3,4-methylenedioxymethamphetamine (ecstasy), methaqualone, and peyote.) (https://jennings.com/)  Legality: Some manufacturers ship CBD products nationally, which is illegal. Often such products are sold online and are therefore available  throughout the country. CBD is openly sold in head shops and health food stores in some states where such sales have not been explicitly legalized. Selling unapproved products with unsubstantiated therapeutic claims is not only a violation of the law, but also can put patients at risk, as these products have not been proven to be safe or effective. Federal illegality makes it difficult to conduct research on CBD.  Reference: "FDA Regulation of Cannabis and Cannabis-Derived Products, Including Cannabidiol (CBD)" - [employer].pt  Warning: CBD is not FDA approved and has not undergo the same manufacturing controls as prescription drugs.  This means that the purity and safety of available CBD may be questionable. Most of the time, despite manufacturer's claims, it is contaminated with THC (delta-9-tetrahydrocannabinol - the chemical in marijuana responsible for the "HIGH").  When this is the case, the [facility] contaminant will trigger a positive urine drug screen (UDS) test for Marijuana (carboxy-THC). Because a positive UDS for any illicit substance is a violation of our medication agreement, your opioid analgesics (pain medicine) may be permanently discontinued. The FDA recently put out a warning about 5 things that everyone should be aware of regarding Delta-8 THC: Delta-8 THC products have not been evaluated or approved by the FDA for safe use and may be marketed in ways that put the public health at risk. The FDA has received adverse event reports involving delta-8 THC-containing products. Delta-8 THC has psychoactive and intoxicating effects. Delta-8 THC manufacturing often involve use of potentially harmful chemicals to create the concentrations of delta-8 THC claimed in the marketplace. The final delta-8 THC product may have potentially harmful by-products (contaminants) due to the chemicals  used in the process. Manufacturing of delta-8 THC products may occur in uncontrolled or unsanitary settings, which may lead to the presence of unsafe contaminants or other potentially harmful substances. Delta-8 THC products should be kept out of the reach of children and pets.  MORE ABOUT CBD  General Information: CBD was discovered in 20 and it is a derivative of the cannabis sativa genus plants (Marijuana and Hemp). It is one of the 113 identified substances found in Marijuana. It accounts for up  to 40% of the plant's extract. As of 2018, preliminary clinical studies on CBD included research for the treatment of anxiety, movement disorders, and pain. CBD is available and consumed in multiple forms, including inhalation of smoke or vapor, as an aerosol spray, and by mouth. It may be supplied as an oil containing CBD, capsules, dried cannabis, or as a liquid solution. CBD is thought not to be as psychoactive as THC (delta-9-tetrahydrocannabinol - the chemical in marijuana responsible for the "HIGH"). Studies suggest that CBD may interact with different biological target receptors in the body, including cannabinoid and other neurotransmitter receptors. As of 2018 the mechanism of action for its biological effects has not been determined.  Side-effects  Adverse reactions: Dry mouth, diarrhea, decreased appetite, fatigue, drowsiness, malaise, weakness, sleep disturbances, and others.  Drug interactions: CBC may interact with other medications such as blood-thinners. Because CBD causes drowsiness on its own, it also increases the drowsiness caused by other medications, including antihistamines (such as Benadryl), benzodiazepines (Xanax, Ativan, Valium), antipsychotics, antidepressants and opioids, as well as alcohol and supplements such as kava, melatonin and St. John's Wort. Be cautious with the following combinations:   Brivaracetam (Briviact) Brivaracetam is changed and broken down by the body. CBD  might decrease how quickly the body breaks down brivaracetam. This might increase levels of brivaracetam in the body.  Caffeine Caffeine is changed and broken down by the body. CBD might decrease how quickly the body breaks down caffeine. This might increase levels of caffeine in the body.  Carbamazepine (Tegretol) Carbamazepine is changed and broken down by the body. CBD might decrease how quickly the body breaks down carbamazepine. This might increase levels of carbamazepine in the body and increase its side effects.  Citalopram (Celexa) Citalopram is changed and broken down by the body. CBD might decrease how quickly the body breaks down citalopram. This might increase levels of citalopram in the body and increase its side effects.  Clobazam (Onfi) Clobazam is changed and broken down by the liver. CBD might decrease how quickly the liver breaks down clobazam. This might increase the effects and side effects of clobazam.  Eslicarbazepine (Aptiom) Eslicarbazepine is changed and broken down by the body. CBD might decrease how quickly the body breaks down eslicarbazepine. This might increase levels of eslicarbazepine in the body by a small amount.  Everolimus (Zostress) Everolimus is changed and broken down by the body. CBD might decrease how quickly the body breaks down everolimus. This might increase levels of everolimus in the body.  Lithium Taking higher doses of CBD might increase levels of lithium. This can increase the risk of lithium toxicity.  Medications changed by the liver (Cytochrome P450 1A1 (CYP1A1) substrates) Some medications are changed and broken down by the liver. CBD might change how quickly the liver breaks down these medications. This could change the effects and side effects of these medications.  Medications changed by the liver (Cytochrome P450 1A2 (CYP1A2) substrates) Some medications are changed and broken down by the liver. CBD might change how quickly the liver  breaks down these medications. This could change the effects and side effects of these medications.  Medications changed by the liver (Cytochrome P450 1B1 (CYP1B1) substrates) Some medications are changed and broken down by the liver. CBD might change how quickly the liver breaks down these medications. This could change the effects and side effects of these medications.  Medications changed by the liver (Cytochrome P450 2A6 (CYP2A6) substrates) Some medications are changed and broken down by the  liver. CBD might change how quickly the liver breaks down these medications. This could change the effects and side effects of these medications.  Medications changed by the liver (Cytochrome P450 2B6 (CYP2B6) substrates) Some medications are changed and broken down by the liver. CBD might change how quickly the liver breaks down these medications. This could change the effects and side effects of these medications.  Medications changed by the liver (Cytochrome P450 2C19 (CYP2C19) substrates) Some medications are changed and broken down by the liver. CBD might change how quickly the liver breaks down these medications. This could change the effects and side effects of these medications.  Medications changed by the liver (Cytochrome P450 2C8 (CYP2C8) substrates) Some medications are changed and broken down by the liver. CBD might change how quickly the liver breaks down these medications. This could change the effects and side effects of these medications.  Medications changed by the liver (Cytochrome P450 2C9 (CYP2C9) substrates) Some medications are changed and broken down by the liver. CBD might change how quickly the liver breaks down these medications. This could change the effects and side effects of these medications.  Medications changed by the liver (Cytochrome P450 2D6 (CYP2D6) substrates) Some medications are changed and broken down by the liver. CBD might change how quickly the liver breaks  down these medications. This could change the effects and side effects of these medications.  Medications changed by the liver (Cytochrome P450 2E1 (CYP2E1) substrates) Some medications are changed and broken down by the liver. CBD might change how quickly the liver breaks down these medications. This could change the effects and side effects of these medications.  Medications changed by the liver (Cytochrome P450 3A4 (CYP3A4) substrates) Some medications are changed and broken down by the liver. CBD might change how quickly the liver breaks down these medications. This could change the effects and side effects of these medications.  Medications changed by the liver (Glucuronidated drugs) Some medications are changed and broken down by the liver. CBD might change how quickly the liver breaks down these medications. This could change the effects and side effects of these medications.  Medications that decrease the breakdown of other medications by the liver (Cytochrome P450 2C19 (CYP2C19) inhibitors) CBD is changed and broken down by the liver. Some drugs decrease how quickly the liver changes and breaks down CBD. This could change the effects and side effects of CBD.  Medications that decrease the breakdown of other medications in the liver (Cytochrome P450 3A4 (CYP3A4) inhibitors) CBD is changed and broken down by the liver. Some drugs decrease how quickly the liver changes and breaks down CBD. This could change the effects and side effects of CBD.  Medications that increase breakdown of other medications by the liver (Cytochrome P450 3A4 (CYP3A4) inducers) CBD is changed and broken down by the liver. Some drugs increase how quickly the liver changes and breaks down CBD. This could change the effects and side effects of CBD.  Medications that increase the breakdown of other medications by the liver (Cytochrome P450 2C19 (CYP2C19) inducers) CBD is changed and broken down by the liver. Some drugs  increase how quickly the liver changes and breaks down CBD. This could change the effects and side effects of CBD.  Methadone (Dolophine) Methadone is broken down by the liver. CBD might decrease how quickly the liver breaks down methadone. Taking cannabidiol along with methadone might increase the effects and side effects of methadone.  Rufinamide (Banzel) Rufinamide is changed and broken down  by the body. CBD might decrease how quickly the body breaks down rufinamide. This might increase levels of rufinamide in the body by a small amount.  Sedative medications (CNS depressants) CBD might cause sleepiness and slowed breathing. Some medications, called sedatives, can also cause sleepiness and slowed breathing. Taking CBD with sedative medications might cause breathing problems and/or too much sleepiness.  Sirolimus (Rapamune) Sirolimus is changed and broken down by the body. CBD might decrease how quickly the body breaks down sirolimus. This might increase levels of sirolimus in the body.  Stiripentol (Diacomit) Stiripentol is changed and broken down by the body. CBD might decrease how quickly the body breaks down stiripentol. This might increase levels of stiripentol in the body and increase its side effects.  Tacrolimus (Prograf) Tacrolimus is changed and broken down by the body. CBD might decrease how quickly the body breaks down tacrolimus. This might increase levels of tacrolimus in the body.  Tamoxifen (Soltamox) Tamoxifen is changed and broken down by the body. CBD might affect how quickly the body breaks down tamoxifen. This might affect levels of tamoxifen in the body.  Topiramate (Topamax) Topiramate is changed and broken down by the body. CBD might decrease how quickly the body breaks down topiramate. This might increase levels of topiramate in the body by a small amount.  Valproate Valproic acid can cause liver injury. Taking cannabidiol with valproic acid might increase the  chance of liver injury. CBD and/or valproic acid might need to be stopped, or the dose might need to be reduced.  Warfarin (Coumadin) CBD might increase levels of warfarin, which can increase the risk for bleeding. CBD and/or warfarin might need to be stopped, or the dose might need to be reduced.  Zonisamide Zonisamide is changed and broken down by the body. CBD might decrease how quickly the body breaks down zonisamide. This might increase levels of zonisamide in the body by a small amount. (Last update: 12/26/2021) ____________________________________________________________________________________________ ______________________________________________________________________  Preparing for Procedure with Sedation  NOTICE: Due to recent regulatory changes, starting on May 28, 2021, procedures requiring intravenous (IV) sedation will no longer be performed at the [facility].  These types of procedures are required to be performed at [facility] ambulatory surgery facility.  We are very sorry for the inconvenience.  Procedure appointments are limited to planned procedures: No Prescription Refills. No disability issues will be discussed. No medication changes will be discussed.  Instructions: Oral Intake: Do not eat or drink anything for at least 8 hours prior to your procedure. (Exception: Blood Pressure Medication. See below.) Transportation: A driver is required. You may not drive yourself after the procedure. Blood Pressure Medicine: Do not forget to take your blood pressure medicine with a sip of water the morning of the procedure. If your Diastolic (lower reading) is above 100 mmHg, elective cases will be cancelled/rescheduled. Blood thinners: These will need to be stopped for procedures. Notify our staff if you are taking any blood thinners. Depending on which one you take, there will be specific instructions on how and when to stop it. Diabetics on insulin: Notify the staff so  that you can be scheduled 1st case in the morning. If your diabetes requires high dose insulin, take only  of your normal insulin dose the morning of the procedure and notify the staff that you have done so. Preventing infections: Shower with an antibacterial soap the morning of your procedure. Build-up your immune system: Take 1000 mg of Vitamin C with every meal (3 times  a day) the day prior to your procedure. Antibiotics: Inform the staff if you have a condition or reason that requires you to take antibiotics before dental procedures. Pregnancy: If you are pregnant, call and cancel the procedure. Sickness: If you have a cold, fever, or any active infections, call and cancel the procedure. Arrival: You must be in the facility at least 30 minutes prior to your scheduled procedure. Children: Do not bring children with you. Dress appropriately: There is always the possibility that your clothing may get soiled. Valuables: Do not bring any jewelry or valuables.  Reasons to call and reschedule or cancel your procedure: (Following these recommendations will minimize the risk of a serious complication.) Surgeries: Avoid having procedures within 2 weeks of any surgery. (Avoid for 2 weeks before or after any surgery). Flu Shots: Avoid having procedures within 2 weeks of a flu shots. (Avoid for 2 weeks before or after immunizations). Barium: Avoid having a procedure within 7-10 days after having had a radiological study involving the use of radiological contrast. (Myelograms, Barium swallow or enema study). Heart attacks: Avoid any elective procedures or surgeries for the initial 6 months after a "Myocardial Infarction" (Heart Attack). Blood thinners: It is imperative that you stop these medications before procedures. Let us know if you if you take any blood thinner.  Infection: Avoid procedures during or within two weeks of an infection (including chest colds or gastrointestinal problems). Symptoms  associated with infections include: Localized redness, fever, chills, night sweats or profuse sweating, burning sensation when voiding, cough, congestion, stuffiness, runny nose, sore throat, diarrhea, nausea, vomiting, cold or Flu symptoms, recent or current infections. It is specially important if the infection is over the area that we intend to treat. Heart and lung problems: Symptoms that may suggest an active cardiopulmonary problem include: cough, chest pain, breathing difficulties or shortness of breath, dizziness, ankle swelling, uncontrolled high or unusually low blood pressure, and/or palpitations. If you are experiencing any of these symptoms, cancel your procedure and contact your primary care physician for an evaluation.  Remember:  Regular Business hours are:  Monday to Thursday 8:00 AM to 4:00 PM  Provider's Schedule: Milinda Pointer, MD:  Procedure days: Tuesday and Thursday 7:30 AM to 4:00 PM  Gillis Santa, MD:  Procedure days: Monday and Wednesday 7:30 AM to 4:00 PM ______________________________________________________________________  ____________________________________________________________________________________________  General Risks and Possible Complications  Patient Responsibilities: It is important that you read this as it is part of your informed consent. It is our duty to inform you of the risks and possible complications associated with treatments offered to you. It is your responsibility as a patient to read this and to ask questions about anything that is not clear or that you believe was not covered in this document.  Patient's Rights: You have the right to refuse treatment. You also have the right to change your mind, even after initially having agreed to have the treatment done. However, under this last option, if you wait until the last second to change your mind, you may be charged for the materials used up to that point.  Introduction: Medicine is not  an Chief Strategy Officer. Everything in Medicine, including the lack of treatment(s), carries the potential for danger, harm, or loss (which is by definition: Risk). In Medicine, a complication is a secondary problem, condition, or disease that can aggravate an already existing one. All treatments carry the risk of possible complications. The fact that a side effects or complications occurs, does not imply  that the treatment was conducted incorrectly. It must be clearly understood that these can happen even when everything is done following the highest safety standards.  No treatment: You can choose not to proceed with the proposed treatment alternative. The "PRO(s)" would include: avoiding the risk of complications associated with the therapy. The "CON(s)" would include: not getting any of the treatment benefits. These benefits fall under one of three categories: diagnostic; therapeutic; and/or palliative. Diagnostic benefits include: getting information which can ultimately lead to improvement of the disease or symptom(s). Therapeutic benefits are those associated with the successful treatment of the disease. Finally, palliative benefits are those related to the decrease of the primary symptoms, without necessarily curing the condition (example: decreasing the pain from a flare-up of a chronic condition, such as incurable terminal cancer).  General Risks and Complications: These are associated to most interventional treatments. They can occur alone, or in combination. They fall under one of the following six (6) categories: no benefit or worsening of symptoms; bleeding; infection; nerve damage; allergic reactions; and/or death. No benefits or worsening of symptoms: In Medicine there are no guarantees, only probabilities. No healthcare provider can ever guarantee that a medical treatment will work, they can only state the probability that it may. Furthermore, there is always the possibility that the condition may  worsen, either directly, or indirectly, as a consequence of the treatment. Bleeding: This is more common if the patient is taking a blood thinner, either prescription or over the counter (example: Goody Powders, Fish oil, Aspirin, Garlic, etc.), or if suffering a condition associated with impaired coagulation (example: Hemophilia, cirrhosis of the liver, low platelet counts, etc.). However, even if you do not have one on these, it can still happen. If you have any of these conditions, or take one of these drugs, make sure to notify your treating physician. Infection: This is more common in patients with a compromised immune system, either due to disease (example: diabetes, cancer, human immunodeficiency virus [HIV], etc.), or due to medications or treatments (example: therapies used to treat cancer and rheumatological diseases). However, even if you do not have one on these, it can still happen. If you have any of these conditions, or take one of these drugs, make sure to notify your treating physician. Nerve Damage: This is more common when the treatment is an invasive one, but it can also happen with the use of medications, such as those used in the treatment of cancer. The damage can occur to small secondary nerves, or to large primary ones, such as those in the spinal cord and brain. This damage may be temporary or permanent and it may lead to impairments that can range from temporary numbness to permanent paralysis and/or brain death. Allergic Reactions: Any time a substance or material comes in contact with our body, there is the possibility of an allergic reaction. These can range from a mild skin rash (contact dermatitis) to a severe systemic reaction (anaphylactic reaction), which can result in death. Death: In general, any medical intervention can result in death, most of the time due to an unforeseen  complication. ____________________________________________________________________________________________

## 2022-05-22 ENCOUNTER — Telehealth: Payer: Self-pay

## 2022-05-22 NOTE — Telephone Encounter (Signed)
Post procedure phone call, Patient states she is doing good.

## 2022-05-28 NOTE — Progress Notes (Unsigned)
Patient: Karen Edwards  Service Category: E/M  Provider: Gaspar Cola, MD  DOB: [birth year]  DOS: 05/29/2022  Location: Office  MRN: [id number]  Setting: Ambulatory outpatient  Referring Provider: Pleas Koch, NP  Type: Established Patient  Specialty: Interventional Pain Management  PCP: Pleas Koch, NP  Location: Remote location  Delivery: TeleHealth     Virtual Encounter - Pain Management PROVIDER NOTE: Information contained herein reflects review and annotations entered in association with encounter. Interpretation of such information and data should be left to medically-trained personnel. Information provided to patient can be located elsewhere in the medical record under "Patient Instructions". Document created using STT-dictation technology, any transcriptional errors that may result from process are unintentional.    Contact & Pharmacy Preferred: [phone] Home: [phone] (home) Mobile: [phone] (mobile) E-mail: [email or web address] .[place] [place], [place] [address] Phone: [phone] Fax: [phone]   Pre-screening  Karen Edwards offered "in-person" vs "virtual" encounter. She indicated preferring virtual for this encounter.   Reason COVID-19*  Social distancing based on CDC and AMA recommendations.   I contacted Karen Edwards on 05/29/2022 via telephone.      I clearly identified myself as Gaspar Cola, MD. I verified that I was speaking with the correct person using two identifiers (Name: [name], and date of birth: 03-Aug-1966).  Consent I sought verbal advanced consent from Karen Edwards for virtual visit interactions. I informed Karen Edwards of possible security and privacy concerns, risks, and limitations associated with providing "not-in-person" medical evaluation and management services. I also informed Karen Edwards of the availability of "in-person"  appointments. Finally, I informed her that there would be a charge for the virtual visit and that she could be  personally, fully or partially, financially responsible for it. Karen Edwards expressed understanding and agreed to proceed.   Historic Elements   Karen Edwards is a 56 y.o. year old, female patient evaluated today after our last contact on 05/21/2022. Karen Edwards  has a past medical history of Anxiety, Chronic back pain, Degenerative joint disease, Depression, Headache, Hypotension, IBS (irritable bowel syndrome) (05/2015), and MVP (mitral valve prolapse). She also  has a past surgical history that includes Cesarean section; Ankle surgery (Right, 03/02/2014); Hemorrhoid surgery (N/A, 06/16/2015); Cervical spine surgery (02/21/2016); Cesarean section with bilateral tubal ligation (1990); Colonoscopy (04/19/2015); Hysteroscopy with D & C (12/24/2013); Colonoscopy with propofol (N/A, 05/18/2018); Esophagogastroduodenoscopy (egd) with propofol (N/A, 05/18/2018); Spinal cord stimulator insertion (N/A, 06/18/2018); Tubal ligation; and Lumbar spinal cord simulator revision (N/A, 03/14/2020). Karen Edwards has a current medication list which includes the following prescription(s): acetaminophen, fluoxetine, hydrocodone-acetaminophen, ondansetron, [START ON 06/20/2022] oxycodone, [START ON 07/20/2022] oxycodone, [START ON 08/19/2022] oxycodone, oxycodone-acetaminophen, pregabalin, scopolamine, tizanidine, and trazodone. She  reports that she has been smoking cigarettes. She has a 18.50 pack-year smoking history. She has never used smokeless tobacco. She reports current alcohol use of about 1.0 standard drink of alcohol per week. She reports that she does not use drugs. Karen Edwards is allergic to gabapentin.   HPI  Today, she is being contacted for both, medication management and a post-procedure assessment.  The patient indicates doing well with the current medication regimen. No adverse reactions or side  effects reported to the medications.   RTCB: 09/18/2022  Post-procedure evaluation   Type: Lumbar Facet, Medial Branch Radiofrequency Ablation (RFA) #3  Laterality: Right  Level: L2, L3, L4, L5, & S1 Medial  Branch Level(s). These levels will denervate the L3-4, L4-5 and L5-S1 lumbar facet joints.  Imaging: Fluoroscopy-guided Anesthesia: Local anesthesia (1-2% Lidocaine) Anxiolysis: IV Versed         Sedation: None. DOS: 05/21/2022  Performed by: Gaspar Cola, MD  Purpose: Therapeutic/Palliative Indications: Low back pain severe enough to impact quality of life or function. Indications: 1. 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 Lumbar facet hypertrophy (Bilateral)   3. Spondylosis without myelopathy or radiculopathy, lumbosacral region   4. DDD (degenerative disc disease), lumbosacral   5. Chronic low back pain (Bilateral) w/o sciatica    Presence of neurostimulator (Thoracolumbar)    Karen Edwards has been dealing with the above chronic pain for longer than three months and has either failed to respond, was unable to tolerate, or simply did not get enough benefit from other more conservative therapies including, but not limited to: 1. Over-the-counter medications 2. Anti-inflammatory medications 3. Muscle relaxants 4. Membrane stabilizers 5. Opioids 6. Physical therapy and/or chiropractic manipulation 7. Modalities (Heat, ice, etc.) 8. Invasive techniques such as nerve blocks. Karen Edwards has attained more than 50% relief of the pain from a series of diagnostic injections conducted in separate occasions.  Pain Score: Pre-procedure: 7 /10 Post-procedure: 0-No pain/10     Effectiveness:  Initial hour after procedure: 100 %. Subsequent 4-6 hours post-procedure: 100 %. Analgesia past initial 6 hours: 50 % (ongoing). Ongoing improvement:  Analgesic: It has been only 1 week since her radiofrequency ablation but she refers having an ongoing 50% improvement of the  pain on the right side of her lower back.  I reminded her that with radiofrequencies and may take as long as 6 weeks to recover.  She agrees that she still has an area of soreness over the right lower back, but this is likely to be normal from her post procedure discomfort. Function: Karen Edwards reports improvement in function ROM: Ms. Murin reports improvement in ROM  Pharmacotherapy Assessment   Opioid Analgesic: Oxycodone IR 5 mg, 1 tab PO QD (5 mg/day of oxycodone) PRN MME/day: 7.5 mg/day.   Monitoring: [place] PMP: PDMP reviewed during this encounter.       Pharmacotherapy: No side-effects or adverse reactions reported. Compliance: No problems identified. Effectiveness: Clinically acceptable. Plan: Refer to "POC". UDS:  Summary  Date Value Ref Range Status  04/16/2021 Note  Final    Comment:    ==================================================================== [employer] Select 13 (MW) ==================================================================== Test                             Result       Flag       Units  Drug Absent but Declared for Prescription Verification   Oxycodone                      Not Detected UNEXPECTED ng/mg creat ==================================================================== Test                      Result    Flag   Units      Ref Range   Creatinine              41               mg/dL      >=20 ==================================================================== Declared Medications:  The flagging and interpretation on this report are based on the  following declared medications.  Unexpected results may arise  from  inaccuracies in the declared medications.   **Note: The testing scope of this panel includes these medications:   Oxycodone (Roxicodone)   **Note: The testing scope of this panel does not include the  following reported medications:   Acetaminophen (Tylenol)  Biotin  Buspirone (Buspar)  Fish Oil  Fluoxetine (Prozac)   Gabapentin (Neurontin)  Ondansetron (Zofran)  Scopolamine  Tizanidine  Trazodone (Desyrel)  Turmeric ==================================================================== For clinical consultation, please call [phone]. ====================================================================    No results found for: "CBDTHCR", "D8THCCBX", "D9THCCBX"   Laboratory Chemistry Profile   Renal Lab Results  Component Value Date   BUN 12 02/01/2021   CREATININE 0.85 02/01/2021   BCR 8 (L) 09/08/2017   GFR 77.34 02/01/2021   GFRAA 93 09/08/2017   GFRNONAA 81 09/08/2017    Hepatic Lab Results  Component Value Date   AST 15 02/01/2021   ALT 11 02/01/2021   ALBUMIN 4.4 02/01/2021   ALKPHOS 59 02/01/2021    Electrolytes Lab Results  Component Value Date   NA 139 02/01/2021   K 4.2 02/01/2021   CL 105 02/01/2021   CALCIUM 9.3 02/01/2021   MG 2.1 09/08/2017    Bone Lab Results  Component Value Date   VD25OH 43.74 01/10/2016   25OHVITD1 51 09/08/2017   25OHVITD2 <1.0 09/08/2017   25OHVITD3 51 09/08/2017    Inflammation (CRP: Acute Phase) (ESR: Chronic Phase) Lab Results  Component Value Date   CRP 1.4 09/08/2017   ESRSEDRATE 11 09/08/2017         Note: Above Lab results reviewed.  Imaging  DG PAIN CLINIC C-ARM 1-60 MIN NO REPORT Fluoro was used, but no Radiologist interpretation will be provided.  Please refer to "NOTES" tab for provider progress note.  Assessment  The primary encounter diagnosis was Chronic pain syndrome. Diagnoses of Chronic low back pain (1ry area of Pain) (Bilateral) (R>L) w/ sciatica (Bilateral), Chronic lower extremity pain (2ry area of Pain) (Bilateral) (R>L), Chronic hip pain (3ry area of Pain) (Bilateral) (R>L), Chronic neck pain (4th area of Pain) (Bilateral) (R>L), Chronic occipital neuralgia (5th area of Pain) (Bilateral) (R>L), Lumbar facet syndrome (Bilateral) (L>R), Pharmacologic therapy, Chronic use of opiate for therapeutic purpose,  Encounter for medication management, and Encounter for chronic pain management were also pertinent to this visit.  Plan of Care  Problem-specific:  No problem-specific Assessment & Plan notes found for this encounter.  Ms. RAIDEN YEARWOOD has a current medication list which includes the following long-term medication(s): fluoxetine, [START ON 06/20/2022] oxycodone, [START ON 07/20/2022] oxycodone, [START ON 08/19/2022] oxycodone, pregabalin, and trazodone.  Pharmacotherapy (Medications Ordered): Meds ordered this encounter  Medications   oxyCODONE (OXY IR/ROXICODONE) 5 MG immediate release tablet    Sig: Take 1 tablet (5 mg total) by mouth 2 (two) times daily as needed for severe pain. Must last 30 days.    Dispense:  40 tablet    Refill:  0    DO NOT: delete (not duplicate); no partial-fill (will deny script to complete), no refill request (F/U required). DISPENSE: 1 day early if closed on fill date. WARN: No CNS-depressants within 8 hrs of med.   oxyCODONE (OXY IR/ROXICODONE) 5 MG immediate release tablet    Sig: Take 1 tablet (5 mg total) by mouth 2 (two) times daily as needed for severe pain. Must last 30 days.    Dispense:  40 tablet    Refill:  0    DO NOT: delete (not duplicate); no partial-fill (will deny script to  complete), no refill request (F/U required). DISPENSE: 1 day early if closed on fill date. WARN: No CNS-depressants within 8 hrs of med.   oxyCODONE (OXY IR/ROXICODONE) 5 MG immediate release tablet    Sig: Take 1 tablet (5 mg total) by mouth 2 (two) times daily as needed for severe pain. Must last 30 days.    Dispense:  40 tablet    Refill:  0    DO NOT: delete (not duplicate); no partial-fill (will deny script to complete), no refill request (F/U required). DISPENSE: 1 day early if closed on fill date. WARN: No CNS-depressants within 8 hrs of med.   Orders:  No orders of the defined types were placed in this encounter.  Follow-up plan:   Return in about 16 weeks  (around 09/18/2022) for Eval-day (M,W), (F2F), (MM).     Interventional Therapies  Risk  Complexity Considerations:   Estimated body mass index is 27.07 kg/m as calculated from the following:   Height as of this encounter: 5' 2"  (1.575 m).   Weight as of this encounter: 148 lb (67.1 kg). WNL   Planned  Pending:   Therapeutic bilateral lumbar facet RFA #3 (Starting w/ right side)    Under consideration:   Therapeutic right L1-2 & L3-4 percutaneous discectomy ([employer] compressor)  Therapeutic intrathecal pump trial    Completed:   Therapeutic right cervical ESI x1 (08/24/2020) (100/100/50/50)  Diagnostic bilateral GONB x1 (08/19/2019) (100/100/100/,50)  Palliative right cervical facet MBB x3 (07/23/2018) (100/100/100/>50)  Palliative left cervical facet MBB x4 (04/01/2019) (90/90/0/0)  Palliative right cervical facet RFA x1 (09/17/2018) (100/100/85/85)  Palliative left cervical facet RFA x1 (05/04/2019) (100/100/50/50)  Therapeutic left L1-2 LESI x1 (05/10/2021) (100/100/100 x 2 days/0)  Therapeutic right L1-2 LESI x2 (02/10/2018) (100/100/70/50-75)  Therapeutic right L1 TFESI x1 (02/10/2018) (100/100/70/50-75)  Diagnostic/therapeutic left superior cluneal NB (L2, L3 dorsal rami) x1 (04/24/2021) (0/0/0/0)  Diagnostic/therapeutic left L1 TFESI x2 (11/15/2021) (100/100/0)  Diagnostic/therapeutic left L3 TFESI x2 (11/15/2021) (100/100/0)  Bilateral lumbar spinal cord stimulator trial (done) (05/14/2018) (by me)  Bilateral lumbar spinal cord stimulator implant (09/28/2019) (by me)  Bilateral spinal cord stimulator revision (03/14/2020) (by me)  Diagnostic bilateral lumbar facet MBB x3 (11/30/2019) (100/100/50/20)  Diagnostic bilateral SI joint Blk x2 (09/18/2021) (100/100/100)  Palliative left lumbar facet RFA x2 (03/27/2021) (100/100/50)  Therapeutic right lumbar facet RFA x2 (02/01/2021) (100/100/50/50)    Therapeutic  Palliative (PRN) options:   Palliative right cervical facet RFA    Diagnostic bilateral SI joint Blk   Therapeutic bilateral lumbar facet RFA     Recent Visits Date Type Provider Dept  05/21/22 Procedure visit Milinda Pointer, MD [facility]  04/09/22 Office Visit Milinda Pointer, MD [facility]  03/26/22 Procedure visit Milinda Pointer, MD [facility]  Showing recent visits within past 90 days and meeting all other requirements Today's Visits Date Type Provider Dept  05/29/22 Office Visit Milinda Pointer, MD [facility]  Showing today's visits and meeting all other requirements Future Appointments Date Type Provider Dept  06/27/22 Appointment Milinda Pointer, MD [facility]  Showing future appointments within next 90 days and meeting all other requirements  I discussed the assessment and treatment plan with the patient. The patient was provided an opportunity to ask questions and all were answered. The patient agreed with the plan and demonstrated an understanding of the instructions.  Patient advised to call back or seek an in-person evaluation if the symptoms or condition worsens.  Duration of encounter: 15  minutes.  Note by: Gaspar Cola, MD Date: 05/29/2022; Time: 11:17 AM

## 2022-05-29 ENCOUNTER — Ambulatory Visit: Payer: Medicare PPO | Attending: Pain Medicine | Admitting: Pain Medicine

## 2022-05-29 DIAGNOSIS — M79605 Pain in left leg: Secondary | ICD-10-CM

## 2022-05-29 DIAGNOSIS — M5481 Occipital neuralgia: Secondary | ICD-10-CM

## 2022-05-29 DIAGNOSIS — G8929 Other chronic pain: Secondary | ICD-10-CM

## 2022-05-29 DIAGNOSIS — M25552 Pain in left hip: Secondary | ICD-10-CM

## 2022-05-29 DIAGNOSIS — M5442 Lumbago with sciatica, left side: Secondary | ICD-10-CM

## 2022-05-29 DIAGNOSIS — M5441 Lumbago with sciatica, right side: Secondary | ICD-10-CM

## 2022-05-29 DIAGNOSIS — Z79891 Long term (current) use of opiate analgesic: Secondary | ICD-10-CM

## 2022-05-29 DIAGNOSIS — M25551 Pain in right hip: Secondary | ICD-10-CM

## 2022-05-29 DIAGNOSIS — M47816 Spondylosis without myelopathy or radiculopathy, lumbar region: Secondary | ICD-10-CM

## 2022-05-29 DIAGNOSIS — Z79899 Other long term (current) drug therapy: Secondary | ICD-10-CM

## 2022-05-29 DIAGNOSIS — G894 Chronic pain syndrome: Secondary | ICD-10-CM

## 2022-05-29 DIAGNOSIS — M79604 Pain in right leg: Secondary | ICD-10-CM

## 2022-05-29 DIAGNOSIS — M542 Cervicalgia: Secondary | ICD-10-CM

## 2022-05-29 MED ORDER — OXYCODONE HCL 5 MG PO TABS: 5.0000 mg | ORAL_TABLET | Freq: Two times a day (BID) | ORAL | 0 refills | Status: DC | PRN

## 2022-05-29 NOTE — Patient Instructions (Signed)

## 2022-06-17 ENCOUNTER — Other Ambulatory Visit: Payer: Self-pay

## 2022-06-24 ENCOUNTER — Ambulatory Visit: Payer: Medicare PPO | Admitting: Gastroenterology

## 2022-06-27 ENCOUNTER — Ambulatory Visit
Admission: RE | Admit: 2022-06-27 | Discharge: 2022-06-27 | Disposition: A | Discharge status: Home / Self Care | Payer: Medicare PPO | Source: Ambulatory Visit | Attending: Pain Medicine | Admitting: Pain Medicine

## 2022-06-27 ENCOUNTER — Ambulatory Visit: Payer: Medicare PPO | Attending: Pain Medicine | Admitting: Pain Medicine

## 2022-06-27 ENCOUNTER — Encounter: Payer: Self-pay | Admitting: Pain Medicine

## 2022-06-27 VITALS — BP 127/68 | HR 28 | Temp 96.1°F | Resp 16 | Ht 61.0 in | Wt 150.0 lb

## 2022-06-27 DIAGNOSIS — Z9682 Presence of neurostimulator: Secondary | ICD-10-CM

## 2022-06-27 DIAGNOSIS — G8929 Other chronic pain: Secondary | ICD-10-CM | POA: Diagnosis not present

## 2022-06-27 DIAGNOSIS — M5137 Other intervertebral disc degeneration, lumbosacral region: Secondary | ICD-10-CM | POA: Diagnosis not present

## 2022-06-27 DIAGNOSIS — G8918 Other acute postprocedural pain: Secondary | ICD-10-CM | POA: Diagnosis not present

## 2022-06-27 DIAGNOSIS — M545 Low back pain, unspecified: Secondary | ICD-10-CM

## 2022-06-27 DIAGNOSIS — M47817 Spondylosis without myelopathy or radiculopathy, lumbosacral region: Secondary | ICD-10-CM | POA: Diagnosis not present

## 2022-06-27 DIAGNOSIS — M47816 Spondylosis without myelopathy or radiculopathy, lumbar region: Secondary | ICD-10-CM | POA: Diagnosis not present

## 2022-06-27 DIAGNOSIS — M5136 Other intervertebral disc degeneration, lumbar region: Secondary | ICD-10-CM | POA: Insufficient documentation

## 2022-06-27 DIAGNOSIS — M51379 Other intervertebral disc degeneration, lumbosacral region without mention of lumbar back pain or lower extremity pain: Secondary | ICD-10-CM

## 2022-06-27 DIAGNOSIS — M51369 Other intervertebral disc degeneration, lumbar region without mention of lumbar back pain or lower extremity pain: Secondary | ICD-10-CM

## 2022-06-27 MED ORDER — FENTANYL CITRATE (PF) 100 MCG/2ML IJ SOLN
25.0000 ug | INTRAMUSCULAR | Status: AC | PRN
  Administered 2022-06-27 (×2): 25 ug via INTRAVENOUS
  Administered 2022-06-27: 50 ug via INTRAVENOUS

## 2022-06-27 MED ORDER — HYDROCODONE-ACETAMINOPHEN 5-325 MG PO TABS: 1.0000 | ORAL_TABLET | Freq: Three times a day (TID) | ORAL | 0 refills | Status: AC | PRN

## 2022-06-27 MED ORDER — LACTATED RINGERS IV SOLN: Freq: Once | INTRAVENOUS | Status: AC

## 2022-06-27 MED ORDER — LIDOCAINE HCL 2 % IJ SOLN
INTRAMUSCULAR | Status: AC
  Filled 2022-06-27: qty 20

## 2022-06-27 MED ORDER — MIDAZOLAM HCL 5 MG/5ML IJ SOLN
0.5000 mg | Freq: Once | INTRAMUSCULAR | Status: AC
  Administered 2022-06-27: 0.5 mg via INTRAVENOUS
  Administered 2022-06-27 (×2): 1 mg via INTRAVENOUS

## 2022-06-27 MED ORDER — MIDAZOLAM HCL 5 MG/5ML IJ SOLN
INTRAMUSCULAR | Status: AC
  Filled 2022-06-27: qty 5

## 2022-06-27 MED ORDER — ROPIVACAINE HCL 2 MG/ML IJ SOLN
INTRAMUSCULAR | Status: AC
  Filled 2022-06-27: qty 20

## 2022-06-27 MED ORDER — FENTANYL CITRATE (PF) 100 MCG/2ML IJ SOLN
INTRAMUSCULAR | Status: AC
  Filled 2022-06-27: qty 2

## 2022-06-27 MED ORDER — TRIAMCINOLONE ACETONIDE 40 MG/ML IJ SUSP
INTRAMUSCULAR | Status: AC
  Filled 2022-06-27: qty 1

## 2022-06-27 MED ORDER — LIDOCAINE HCL 2 % IJ SOLN
20.0000 mL | Freq: Once | INTRAMUSCULAR | Status: AC
  Administered 2022-06-27: 400 mg

## 2022-06-27 MED ORDER — ROPIVACAINE HCL 2 MG/ML IJ SOLN
9.0000 mL | Freq: Once | INTRAMUSCULAR | Status: AC
  Administered 2022-06-27: 9 mL via PERINEURAL

## 2022-06-27 MED ORDER — TRIAMCINOLONE ACETONIDE 40 MG/ML IJ SUSP
40.0000 mg | Freq: Once | INTRAMUSCULAR | Status: AC
  Administered 2022-06-27: 40 mg

## 2022-06-27 NOTE — Progress Notes (Signed)
PROVIDER NOTE: Interpretation of information contained herein should be left to medically-trained personnel. Specific patient instructions are provided elsewhere under "Patient Instructions" section of medical record. This document was created in part using STT-dictation technology, any transcriptional errors that may result from this process are unintentional.  Patient: Karen Edwards Type: Established DOB: [birth year] MRN: [id number] PCP: Pleas Koch, NP  Service: Procedure DOS: 06/27/2022 Setting: Ambulatory Location: Ambulatory outpatient facility Delivery: Face-to-face Provider: Gaspar Cola, MD Specialty: Interventional Pain Management Specialty designation: 09 Location: Outpatient facility Ref. Prov.: Milinda Pointer, MD    Procedure:           Type: Lumbar Facet, Medial Branch Radiofrequency Ablation (RFA) #3  Laterality: Left (-LT)  Level: L2, L3, L4, L5, & S1 Medial Branch Level(s). These levels will denervate the L3-4, L4-5 and L5-S1 lumbar facet joints.  Imaging: Fluoroscopy-guided         Anesthesia: Local anesthesia (1-2% Lidocaine) Anxiolysis: IV Versed         Sedation: Moderate Sedation              .  DOS: 06/27/2022  Performed by: Gaspar Cola, MD  Purpose: Therapeutic/Palliative Indications: Low back pain severe enough to impact quality of life or function. Indications: 1. 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 Spondylosis without myelopathy or radiculopathy, lumbosacral region   3. Lumbar facet hypertrophy (Bilateral)   4. DDD (degenerative disc disease), lumbar   5. Chronic low back pain (Bilateral) w/o sciatica   6. Presence of neurostimulator (Thoracolumbar)    Karen Edwards has been dealing with the above chronic pain for longer than three months and has either failed to respond, was unable to tolerate, or simply did not get enough benefit from other more conservative therapies including, but not limited to: 1. Over-the-counter  medications 2. Anti-inflammatory medications 3. Muscle relaxants 4. Membrane stabilizers 5. Opioids 6. Physical therapy and/or chiropractic manipulation 7. Modalities (Heat, ice, etc.) 8. Invasive techniques such as nerve blocks. Karen Edwards has attained more than 50% relief of the pain from a series of diagnostic injections conducted in separate occasions.  Pain Score: Pre-procedure: 7 /10 Post-procedure: 0-No pain/10     Position / Prep / Materials:  Position: Prone  Prep solution: DuraPrep (Iodine Povacrylex [0.7% available iodine] and Isopropyl Alcohol, 74% w/w) Prep Area: Entire Lumbosacral Region (Lower back from mid-thoracic region to end of tailbone and from flank to flank.) Materials:  Tray: RFA (Radiofrequency) tray Needle(s):  Type: RFA (Teflon-coated radiofrequency ablation needles) Gauge (G): 22  Length: Regular (10cm) Qty: 5  Pre-op H&P Assessment:  Karen Edwards is a 56 y.o. (year old), female patient, seen today for interventional treatment. She  has a past surgical history that includes Cesarean section; Ankle surgery (Right, 03/02/2014); Hemorrhoid surgery (N/A, 06/16/2015); Cervical spine surgery (02/21/2016); Cesarean section with bilateral tubal ligation (1990); Colonoscopy (04/19/2015); Hysteroscopy with D & C (12/24/2013); Colonoscopy with propofol (N/A, 05/18/2018); Esophagogastroduodenoscopy (egd) with propofol (N/A, 05/18/2018); Spinal cord stimulator insertion (N/A, 06/18/2018); Tubal ligation; and Lumbar spinal cord simulator revision (N/A, 03/14/2020). Karen Edwards has a current medication list which includes the following prescription(s): acetaminophen, cyclobenzaprine, fluoxetine, hydrocodone-acetaminophen, [START ON 07/04/2022] hydrocodone-acetaminophen, ondansetron, oxycodone, [START ON 07/20/2022] oxycodone, [START ON 08/19/2022] oxycodone, oxycodone-acetaminophen, pregabalin, scopolamine, tizanidine, and trazodone. Her primarily concern today is the Back Pain  (lower)  Initial Vital Signs:  Pulse/HCG Rate: 64ECG Heart Rate: 61 Temp: (!) 97.4 F (36.3 C) Resp: 16 BP: (!) 142/73 SpO2: 97 %  BMI: Estimated body  mass index is 28.34 kg/m as calculated from the following:   Height as of this encounter: '5\' 1"'$  (1.549 m).   Weight as of this encounter: 150 lb (68 kg).  Risk Assessment: Allergies: Reviewed. She is allergic to gabapentin.  Allergy Precautions: None required Coagulopathies: Reviewed. None identified.  Blood-thinner therapy: None at this time Active Infection(s): Reviewed. None identified. Karen Edwards is afebrile  Site Confirmation: Karen Edwards was asked to confirm the procedure and laterality before marking the site Procedure checklist: Completed Consent: Before the procedure and under the influence of no sedative(s), amnesic(s), or anxiolytics, the patient was informed of the treatment options, risks and possible complications. To fulfill our ethical and legal obligations, as recommended by the American Medical Association's Code of Ethics, I have informed the patient of my clinical impression; the nature and purpose of the treatment or procedure; the risks, benefits, and possible complications of the intervention; the alternatives, including doing nothing; the risk(s) and benefit(s) of the alternative treatment(s) or procedure(s); and the risk(s) and benefit(s) of doing nothing. The patient was provided information about the general risks and possible complications associated with the procedure. These may include, but are not limited to: failure to achieve desired goals, infection, bleeding, organ or nerve damage, allergic reactions, paralysis, and death. In addition, the patient was informed of those risks and complications associated to Spine-related procedures, such as failure to decrease pain; infection (i.e.: Meningitis, epidural or intraspinal abscess); bleeding (i.e.: epidural hematoma, subarachnoid hemorrhage, or any other type  of intraspinal or peri-dural bleeding); organ or nerve damage (i.e.: Any type of peripheral nerve, nerve root, or spinal cord injury) with subsequent damage to sensory, motor, and/or autonomic systems, resulting in permanent pain, numbness, and/or weakness of one or several areas of the body; allergic reactions; (i.e.: anaphylactic reaction); and/or death. Furthermore, the patient was informed of those risks and complications associated with the medications. These include, but are not limited to: allergic reactions (i.e.: anaphylactic or anaphylactoid reaction(s)); adrenal axis suppression; blood sugar elevation that in diabetics may result in ketoacidosis or comma; water retention that in patients with history of congestive heart failure may result in shortness of breath, pulmonary edema, and decompensation with resultant heart failure; weight gain; swelling or edema; medication-induced neural toxicity; particulate matter embolism and blood vessel occlusion with resultant organ, and/or nervous system infarction; and/or aseptic necrosis of one or more joints. Finally, the patient was informed that Medicine is not an exact science; therefore, there is also the possibility of unforeseen or unpredictable risks and/or possible complications that may result in a catastrophic outcome. The patient indicated having understood very clearly. We have given the patient no guarantees and we have made no promises. Enough time was given to the patient to ask questions, all of which were answered to the patient's satisfaction. Ms. Horseman has indicated that she wanted to continue with the procedure. Attestation: I, the ordering provider, attest that I have discussed with the patient the benefits, risks, side-effects, alternatives, likelihood of achieving goals, and potential problems during recovery for the procedure that I have provided informed consent. Date  Time: 06/27/2022  8:14 AM  Pre-Procedure Preparation:   Monitoring: As per clinic protocol. Respiration, ETCO2, SpO2, BP, heart rate and rhythm monitor placed and checked for adequate function Safety Precautions: Patient was assessed for positional comfort and pressure points before starting the procedure. Time-out: I initiated and conducted the "Time-out" before starting the procedure, as per protocol. The patient was asked to participate by confirming the accuracy  of the "Time Out" information. Verification of the correct person, site, and procedure were performed and confirmed by me, the nursing staff, and the patient. "Time-out" conducted as per Joint Commission's Universal Protocol (UP.01.01.01). Time: 0921  Description of Procedure:          Laterality: Left Levels:  L2, L3, L4, L5, & S1 Medial Branch Level(s). Safety Precautions: Aspiration looking for blood return was conducted prior to all injections. At no point did we inject any substances, as a needle was being advanced. Before injecting, the patient was told to immediately notify me if she was experiencing any new onset of "ringing in the ears, or metallic taste in the mouth". No attempts were made at seeking any paresthesias. Safe injection practices and needle disposal techniques used. Medications properly checked for expiration dates. SDV (single dose vial) medications used. After the completion of the procedure, all disposable equipment used was discarded in the proper designated medical waste containers. Local Anesthesia: Protocol guidelines were followed. The patient was positioned over the fluoroscopy table. The area was prepped in the usual manner. The time-out was completed. The target area was identified using fluoroscopy. A 12-in long, straight, sterile hemostat was used with fluoroscopic guidance to locate the targets for each level blocked. Once located, the skin was marked with an approved surgical skin marker. Once all sites were marked, the skin (epidermis, dermis, and hypodermis),  as well as deeper tissues (fat, connective tissue and muscle) were infiltrated with a small amount of a short-acting local anesthetic, loaded on a 10cc syringe with a 25G, 1.5-in  Needle. An appropriate amount of time was allowed for local anesthetics to take effect before proceeding to the next step. Technical description of process:  Radiofrequency Ablation (RFA) L2 Medial Branch Nerve RFA: The target area for the L2 medial branch is at the junction of the postero-lateral aspect of the superior articular process and the superior, posterior, and medial edge of the transverse process of L3. Under fluoroscopic guidance, a Radiofrequency needle was inserted until contact was made with os over the superior postero-lateral aspect of the pedicular shadow (target area). Sensory and motor testing was conducted to properly adjust the position of the needle. Once satisfactory placement of the needle was achieved, the numbing solution was slowly injected after negative aspiration for blood. 2.0 mL of the nerve block solution was injected without difficulty or complication. After waiting for at least 3 minutes, the ablation was performed. Once completed, the needle was removed intact. L3 Medial Branch Nerve RFA: The target area for the L3 medial branch is at the junction of the postero-lateral aspect of the superior articular process and the superior, posterior, and medial edge of the transverse process of L4. Under fluoroscopic guidance, a Radiofrequency needle was inserted until contact was made with os over the superior postero-lateral aspect of the pedicular shadow (target area). Sensory and motor testing was conducted to properly adjust the position of the needle. Once satisfactory placement of the needle was achieved, the numbing solution was slowly injected after negative aspiration for blood. 2.0 mL of the nerve block solution was injected without difficulty or complication. After waiting for at least 3 minutes,  the ablation was performed. Once completed, the needle was removed intact. L4 Medial Branch Nerve RFA: The target area for the L4 medial branch is at the junction of the postero-lateral aspect of the superior articular process and the superior, posterior, and medial edge of the transverse process of L5. Under fluoroscopic guidance, a Radiofrequency  needle was inserted until contact was made with os over the superior postero-lateral aspect of the pedicular shadow (target area). Sensory and motor testing was conducted to properly adjust the position of the needle. Once satisfactory placement of the needle was achieved, the numbing solution was slowly injected after negative aspiration for blood. 2.0 mL of the nerve block solution was injected without difficulty or complication. After waiting for at least 3 minutes, the ablation was performed. Once completed, the needle was removed intact. L5 Medial Branch Nerve RFA: The target area for the L5 medial branch is at the junction of the postero-lateral aspect of the superior articular process of S1 and the superior, posterior, and medial edge of the sacral ala. Under fluoroscopic guidance, a Radiofrequency needle was inserted until contact was made with os over the superior postero-lateral aspect of the pedicular shadow (target area). Sensory and motor testing was conducted to properly adjust the position of the needle. Once satisfactory placement of the needle was achieved, the numbing solution was slowly injected after negative aspiration for blood. 2.0 mL of the nerve block solution was injected without difficulty or complication. After waiting for at least 3 minutes, the ablation was performed. Once completed, the needle was removed intact. S1 Medial Branch Nerve RFA: The target area for the S1 medial branch is located inferior to the junction of the S1 superior articular process and the L5 inferior articular process, posterior, inferior, and lateral to the 6  o'clock position of the L5-S1 facet joint, just superior to the S1 posterior foramen. Under fluoroscopic guidance, the Radiofrequency needle was advanced until contact was made with os over the Target area. Sensory and motor testing was conducted to properly adjust the position of the needle. Once satisfactory placement of the needle was achieved, the numbing solution was slowly injected after negative aspiration for blood. 2.0 mL of the nerve block solution was injected without difficulty or complication. After waiting for at least 3 minutes, the ablation was performed. Once completed, the needle was removed intact. Radiofrequency lesioning (ablation):  Radiofrequency Generator: Medtronic AccurianTM AG 1000 RF Generator Sensory Stimulation Parameters: 50 Hz was used to locate & identify the nerve, making sure that the needle was positioned such that there was no sensory stimulation below 0.3 V or above 0.7 V. Motor Stimulation Parameters: 2 Hz was used to evaluate the motor component. Care was taken not to lesion any nerves that demonstrated motor stimulation of the lower extremities at an output of less than 2.5 times that of the sensory threshold, or a maximum of 2.0 V. Lesioning Technique Parameters: Standard Radiofrequency settings. (Not bipolar or pulsed.) Temperature Settings: 80 degrees C Lesioning time: 60 seconds Intra-operative Compliance: Compliant  Once the entire procedure was completed, the treated area was cleaned, making sure to leave some of the prepping solution back to take advantage of its long term bactericidal properties.    Illustration of the posterior view of the lumbar spine and the posterior neural structures. Laminae of L2 through S1 are labeled. DPRL5, dorsal primary ramus of L5; DPRS1, dorsal primary ramus of S1; DPR3, dorsal primary ramus of L3; FJ, facet (zygapophyseal) joint L3-L4; I, inferior articular process of L4; LB1, lateral branch of dorsal primary ramus of L1;  IAB, inferior articular branches from L3 medial branch (supplies L4-L5 facet joint); IBP, intermediate branch plexus; MB3, medial branch of dorsal primary ramus of L3; NR3, third lumbar nerve root; S, superior articular process of L5; SAB, superior articular branches from L4 (supplies L4-5  facet joint also); TP3, transverse process of L3.  Vitals:   06/27/22 1003 06/27/22 1013 06/27/22 1023 06/27/22 1033  BP: (!) 143/63 (!) 127/59 119/63 127/68  Pulse:      Resp: 16 17 (!) 28 16  Temp:  (!) 95.7 F (35.4 C)  (!) 96.1 F (35.6 C)  TempSrc:  Tympanic  Tympanic  SpO2: 95% 90% 98% 100%  Weight:      Height:        Start Time: 0921 hrs. End Time: 1002 hrs.  Imaging Guidance (Spinal):          Type of Imaging Technique: Fluoroscopy Guidance (Spinal) Indication(s): Assistance in needle guidance and placement for procedures requiring needle placement in or near specific anatomical locations not easily accessible without such assistance. Exposure Time: Please see nurses notes. Contrast: None used. Fluoroscopic Guidance: I was personally present during the use of fluoroscopy. "Tunnel Vision Technique" used to obtain the best possible view of the target area. Parallax error corrected before commencing the procedure. "Direction-depth-direction" technique used to introduce the needle under continuous pulsed fluoroscopy. Once target was reached, antero-posterior, oblique, and lateral fluoroscopic projection used confirm needle placement in all planes. Images permanently stored in EMR. Interpretation: No contrast injected. I personally interpreted the imaging intraoperatively. Adequate needle placement confirmed in multiple planes. Permanent images saved into the patient's record.  Antibiotic Prophylaxis:   Anti-infectives (From admission, onward)    None      Indication(s): None identified  Post-operative Assessment:  Post-procedure Vital Signs:  Pulse/HCG Rate: (!) 2866 Temp: (!) 96.1 F  (35.6 C) Resp: 16 BP: 127/68 SpO2: 100 %  EBL: None  Complications: No immediate post-treatment complications observed by team, or reported by patient.  Note: The patient tolerated the entire procedure well. A repeat set of vitals were taken after the procedure and the patient was kept under observation following institutional policy, for this type of procedure. Post-procedural neurological assessment was performed, showing return to baseline, prior to discharge. The patient was provided with post-procedure discharge instructions, including a section on how to identify potential problems. Should any problems arise concerning this procedure, the patient was given instructions to immediately contact us, at any time, without hesitation. In any case, we plan to contact the patient by telephone for a follow-up status report regarding this interventional procedure.  Comments:  No additional relevant information.  Plan of Care  Orders:  Orders Placed This Encounter  Procedures   Radiofrequency,Lumbar    Scheduling Instructions:     Side(s): Left-sided     Level: L3-4, L4-5, & L5-S1 Facets (L2, L3, L4, L5, & S1 Medial Branch Nerves)     Sedation: With Sedation.     Timeframe: Today    Order Specific Question:   Where will this procedure be performed?    Answer:   [facility]   DG [facility] C-ARM 1-60 MIN NO REPORT    Intraoperative interpretation by procedural physician at [place].    Standing Status:   Standing    Number of Occurrences:   1    Order Specific Question:   Reason for exam:    Answer:   Assistance in needle guidance and placement for procedures requiring needle placement in or near specific anatomical locations not easily accessible without such assistance.   Informed Consent Details: Physician/Practitioner Attestation; Transcribe to consent form and obtain patient signature    Nursing Order: Transcribe to consent form and obtain patient  signature. Note: Always confirm laterality  of pain with Ms. Tye Savoy, before procedure.    Order Specific Question:   Physician/Practitioner attestation of informed consent for procedure/surgical case    Answer:   I, the physician/practitioner, attest that I have discussed with the patient the benefits, risks, side effects, alternatives, likelihood of achieving goals and potential problems during recovery for the procedure that I have provided informed consent.    Order Specific Question:   Procedure    Answer:   Lumbar Facet Radiofrequency Ablation    Order Specific Question:   Physician/Practitioner performing the procedure    Answer:   Zabella Wease A. 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   Provide equipment / supplies at bedside    "Radiofrequency Tray"; Large hemostat (x1); Small hemostat (x1); Towels (x8); 4x4 sterile sponge pack (x1) Needle type: Teflon-coated Radiofrequency Needle (Disposable  single use) Size: Regular Quantity: 5    Standing Status:   Standing    Number of Occurrences:   1    Order Specific Question:   Specify    Answer:   Radiofrequency Tray   Chronic Opioid Analgesic:  Oxycodone IR 5 mg, 1 tab PO QD (5 mg/day of oxycodone) PRN MME/day: 7.5 mg/day.   Medications ordered for procedure: Meds ordered this encounter  Medications   lidocaine (XYLOCAINE) 2 % (with pres) injection 400 mg   lactated ringers infusion   midazolam (VERSED) 5 MG/5ML injection 0.5-2 mg    Make sure Flumazenil is available in the pyxis when using this medication. If oversedation occurs, administer 0.2 mg IV over 15 sec. If after 45 sec no response, administer 0.2 mg again over 1 min; may repeat at 1 min intervals;  not to exceed 4 doses (1 mg)   fentaNYL (SUBLIMAZE) injection 25-50 mcg    Make sure Narcan is available in the pyxis when using this medication. In the event of respiratory depression (RR< 8/min): Titrate NARCAN (naloxone) in increments of 0.1 to 0.2 mg IV at 2-3 minute intervals, until desired degree of reversal.   triamcinolone acetonide (KENALOG-40) injection 40 mg   ropivacaine (PF) 2 mg/mL (0.2%) (NAROPIN) injection 9 mL   HYDROcodone-acetaminophen (NORCO/VICODIN) 5-325 MG tablet    Sig: Take 1 tablet by mouth every 8 (eight) hours as needed for up to 7 days for severe pain. Must last 7 days.    Dispense:  21 tablet    Refill:  0    For acute post-operative pain. Not to be refilled. Must last 7 days.   HYDROcodone-acetaminophen (NORCO/VICODIN) 5-325 MG tablet    Sig: Take 1 tablet by mouth every 8 (eight) hours as needed for up to 7 days for severe pain. Must last for 7 days.    Dispense:  21 tablet    Refill:  0    For acute post-operative pain. Not to be refilled. Must last 7 days.   Medications administered: We administered lidocaine, lactated ringers, midazolam, fentaNYL, triamcinolone acetonide, and ropivacaine (PF) 2 mg/mL (0.2%).  See the medical record for exact dosing, route, and time of administration.  Follow-up plan:   Return in about 6 weeks (around 08/08/2022) for Proc-day (T,Th), (VV), (PPE).       Interventional Therapies  Risk  Complexity Considerations:   Estimated body  mass index is 27.07 kg/m as calculated from the following:   Height as of this encounter: '5\' 2"'$  (1.575 m).   Weight as of this encounter: 148 lb (67.1 kg). WNL   Planned  Pending:   Therapeutic left lumbar facet RFA #3 (06/27/2022)    Under consideration:   Therapeutic right L1-2 & L3-4 percutaneous discectomy ([employer] compressor)  Therapeutic intrathecal pump trial    Completed:   Therapeutic right cervical ESI x1 (08/24/2020) (100/100/50/50)  Diagnostic bilateral GONB x1 (08/19/2019)  (100/100/100/,50)  Palliative right cervical facet MBB x3 (07/23/2018) (100/100/100/>50)  Palliative left cervical facet MBB x4 (04/01/2019) (90/90/0/0)  Palliative right cervical facet RFA x1 (09/17/2018) (100/100/85/85)  Palliative left cervical facet RFA x1 (05/04/2019) (100/100/50/50)  Therapeutic left L1-2 LESI x1 (05/10/2021) (100/100/100 x 2 days/0)  Therapeutic right L1-2 LESI x2 (02/10/2018) (100/100/70/50-75)  Therapeutic right L1 TFESI x1 (02/10/2018) (100/100/70/50-75)  Diagnostic/therapeutic left superior cluneal NB (L2, L3 dorsal rami) x1 (04/24/2021) (0/0/0/0)  Diagnostic/therapeutic left L1 TFESI x2 (11/15/2021) (100/100/0)  Diagnostic/therapeutic left L3 TFESI x2 (11/15/2021) (100/100/0)  Bilateral lumbar spinal cord stimulator trial (done) (05/14/2018) (by me)  Bilateral lumbar spinal cord stimulator implant (09/28/2019) (by me)  Bilateral spinal cord stimulator revision (03/14/2020) (by me)  Diagnostic bilateral lumbar facet MBB x3 (11/30/2019) (100/100/50/20)  Diagnostic bilateral SI joint Blk x2 (09/18/2021) (100/100/100)  Palliative left lumbar facet RFA x3 (06/27/2022)  Therapeutic right lumbar facet RFA x3 (05/21/2022) (100/100/50/50)    Therapeutic  Palliative (PRN) options:   Palliative right cervical facet RFA   Diagnostic bilateral SI joint Blk   Therapeutic bilateral lumbar facet RFA      Recent Visits Date Type Provider Dept  05/29/22 Office Visit Milinda Pointer, MD [facility]  05/21/22 Procedure visit Milinda Pointer, MD [facility]  04/09/22 Office Visit Milinda Pointer, MD [facility]  Showing recent visits within past 90 days and meeting all other requirements Today's Visits Date Type Provider Dept  06/27/22 Procedure visit Milinda Pointer, MD [facility]  Showing today's visits and meeting all other requirements Future Appointments Date Type Provider Dept  08/08/22 Appointment Milinda Pointer, MD  [facility]  09/11/22 Appointment Milinda Pointer, MD [facility]  Showing future appointments within next 90 days and meeting all other requirements  Disposition: Discharge home  Discharge (Date  Time): 06/27/2022; 1042 hrs.   Primary Care Physician: Pleas Koch, NP Location: [facility] Note by: Gaspar Cola, MD Date: 06/27/2022; Time: 12:46 PM  Disclaimer:  Medicine is not an Chief Strategy Officer. The only guarantee in medicine is that nothing is guaranteed. It is important to note that the decision to proceed with this intervention was based on the information collected from the patient. The Data and conclusions were drawn from the patient's questionnaire, the interview, and the physical examination. Because the information was provided in large part by the patient, it cannot be guaranteed that it has not been purposely or unconsciously manipulated. Every effort has been made to obtain as much relevant data as possible for this evaluation. It is important to note that the conclusions that lead to this procedure are derived in large part from the available data. Always take into account that the treatment will also be dependent on availability of resources and existing treatment guidelines, considered by other Pain Management Practitioners as being common knowledge and practice, at the time of the intervention. For Medico-Legal purposes, it is also important to point out that variation in procedural  techniques and pharmacological choices are the acceptable norm. The indications, contraindications, technique, and results of the above procedure should only be interpreted and judged by a Board-Certified Interventional Pain Specialist with extensive familiarity and expertise in the same exact procedure and technique.

## 2022-06-27 NOTE — Progress Notes (Signed)
Safety precautions to be maintained throughout the outpatient stay will include: orient to surroundings, keep bed in low position, maintain call bell within reach at all times, provide assistance with transfer out of bed and ambulation.  

## 2022-06-27 NOTE — Patient Instructions (Signed)
____________________________________________________________________________________________  Virtual Visits   What is a "Virtual Visit"? It is a Metallurgist (medical visit) that takes place on real time (NOT TEXT or E-MAIL) over the telephone or computer device (desktop, laptop, tablet, smart phone, etc.). It allows for more location flexibility between the patient and the healthcare provider.  Who decides when these types of visits will be used? The physician.  Who is eligible for these types of visits? Only those patients that can be reliably reached over the telephone.  What do you mean by reliably? We do not have time to call everyone multiple times, therefore those that tend to screen calls and then call back later are not suitable candidates for this system. We understand how people are reluctant to pickup on "unknown" calls, therefore, we suggest adding our telephone numbers to your list of "CONTACT(s)". This way, you should be able to readily identify our calls when you receive one. All of our numbers are available below.   Who is not eligible? This option is not available for medication management encounters, specially for controlled substances. Patients on pain medications that fall under the category of controlled substances have to come in for "Face-to-Face" encounters. This is required for mandatory monitoring of these substances. You may be asked to provide a sample for an unannounced urine drug screening test (UDS), and we will need to count your pain pills. Not bringing your pills to be counted may result in no refill. Obviously, neither one of these can be done over the phone.  When will this type of visits be used? You can request a virtual visit whenever you are physically unable to attend a regular appointment. The decision will be made by the physician (or healthcare provider) on a case by case basis.   At what time will I be called? This is an  excellent question. The providers will try to call you whenever they have time available. Do not expect to be called at any specific time. The secretaries will assign you a time for your virtual visit appointment, but this is done simply to keep a list of those patients that need to be called, but not for the purpose of keeping a time schedule. Be advised that the call may come in anytime during the day, between the hours of 8:00 AM and 8::00 PM, depending on provider availability. We do understand that the system is not perfect. If you are unable to be available that day on a moments notice, then request an "in-person" appointment rather than a "virtual visit".  Can I request my medication visits to be "Virtual"? Yes you may request it, but the decision is entirely up to the healthcare provider. Control substances require specific monitoring that requires Face-to-Face encounters. The number of encounters  and the extent of the monitoring is determined on a case by case basis.  Add a new contact to your smart phone and label it "PAIN CLINIC" Under this contact add the following numbers: Main: [phone] (Official Contact Number) Nurses: [phone] (These are outgoing only calling systems. Do not call this number.) Dr. Dossie Arbour: [phone] or [phone] (Outgoing calls only. Do not call this number.)  ____________________________________________________________________________________________  ___________________________________________________________________________________________  Post-Radiofrequency (RF) Discharge Instructions  You have just completed a Radiofrequency Neurotomy.  The following instructions will provide you with information and guidelines for self-care upon discharge.  If at any time you have questions or concerns please call your physician. DO NOT DRIVE YOURSELF!!  Instructions: Apply  ice: Fill a plastic sandwich bag with crushed ice. Cover it with a small  towel and apply to injection site. Apply for 15 minutes then remove x 15 minutes. Repeat sequence on day of procedure, until you go to bed. The purpose is to minimize swelling and discomfort after procedure. Apply heat: Apply heat to procedure site starting the day following the procedure. The purpose is to treat any soreness and discomfort from the procedure. Food intake: No eating limitations, unless stipulated above.  Nevertheless, if you have had sedation, you may experience some nausea.  In this case, it may be wise to wait at least two hours prior to resuming regular diet. Physical activities: Keep activities to a minimum for the first 8 hours after the procedure. For the first 24 hours after the procedure, do not drive a motor vehicle,  Operate heavy machinery, power tools, or handle any weapons.  Consider walking with the use of an assistive device or accompanied by an adult for the first 24 hours.  Do not drink alcoholic beverages including beer.  Do not make any important decisions or sign any legal documents. Go home and rest today.  Resume activities tomorrow, as tolerated.  Use caution in moving about as you may experience mild leg weakness.  Use caution in cooking, use of household electrical appliances and climbing steps. Driving: If you have received any sedation, you are not allowed to drive for 24 hours after your procedure. Blood thinner: Restart your blood thinner 6 hours after your procedure. (Only for those taking blood thinners) Insulin: As soon as you can eat, you may resume your normal dosing schedule. (Only for those taking insulin) Medications: May resume pre-procedure medications.  Do not take any drugs, other than what has been prescribed to you. Infection prevention: Keep procedure site clean and dry. Post-procedure Pain Diary: Extremely important that this be done correctly and accurately. Recorded information will be used to determine the next step in treatment. Pain  evaluated is that of treated area only. Do not include pain from an untreated area. Complete every hour, on the hour, for the initial 8 hours. Set an alarm to help you do this part accurately. Do not go to sleep and have it completed later. It will not be accurate. Follow-up appointment: Keep your follow-up appointment after the procedure. Usually 2-6 weeks after radiofrequency. Bring you pain diary. The information collected will be essential for your long-term care.   Expect: From numbing medicine (AKA: Local Anesthetics): Numbness or decrease in pain. Onset: Full effect within 15 minutes of injected. Duration: It will depend on the type of local anesthetic used. On the average, 1 to 8 hours.  From steroids (when added): Decrease in swelling or inflammation. Once inflammation is improved, relief of the pain will follow. Onset of benefits: Depends on the amount of swelling present. The more swelling, the longer it will take for the benefits to be seen. In some cases, up to 10 days. Duration: Steroids will stay in the system x 2 weeks. Duration of benefits will depend on multiple posibilities including persistent irritating factors. From procedure: Some discomfort is to be expected once the numbing medicine wears off. In the case of radiofrequency procedures, this may last as long as 6 weeks. Additional post-procedure pain medication is provided for this. Discomfort is minimized if ice and heat are applied as instructed.  Call if: You experience numbness and weakness that gets worse with time, as opposed to wearing off. He experience any unusual  bleeding, difficulty breathing, or loss of the ability to control your bowel and bladder. (This applies to Spinal procedures only) You experience any redness, swelling, heat, red streaks, elevated temperature, fever, or any other signs of a possible infection.  Emergency Numbers: [place] business hours (Monday - Thursday, 8:00 AM - 4:00 PM) (Friday, 9:00  AM - 12:00 Noon): [phone] After hours: [phone] ____________________________________________________________________________________________

## 2022-06-28 ENCOUNTER — Telehealth: Payer: Self-pay | Admitting: *Deleted

## 2022-06-28 NOTE — Telephone Encounter (Signed)
Post procedure call; reports that she is doing well.

## 2022-08-01 DIAGNOSIS — M2550 Pain in unspecified joint: Secondary | ICD-10-CM | POA: Diagnosis not present

## 2022-08-01 DIAGNOSIS — F32A Depression, unspecified: Secondary | ICD-10-CM | POA: Diagnosis not present

## 2022-08-01 DIAGNOSIS — F321 Major depressive disorder, single episode, moderate: Secondary | ICD-10-CM | POA: Diagnosis not present

## 2022-08-01 DIAGNOSIS — M25519 Pain in unspecified shoulder: Secondary | ICD-10-CM | POA: Diagnosis not present

## 2022-08-01 DIAGNOSIS — R635 Abnormal weight gain: Secondary | ICD-10-CM | POA: Diagnosis not present

## 2022-08-01 DIAGNOSIS — Z1329 Encounter for screening for other suspected endocrine disorder: Secondary | ICD-10-CM | POA: Diagnosis not present

## 2022-08-01 DIAGNOSIS — Z13220 Encounter for screening for lipoid disorders: Secondary | ICD-10-CM | POA: Diagnosis not present

## 2022-08-01 DIAGNOSIS — R5382 Chronic fatigue, unspecified: Secondary | ICD-10-CM | POA: Diagnosis not present

## 2022-08-01 DIAGNOSIS — M25569 Pain in unspecified knee: Secondary | ICD-10-CM | POA: Diagnosis not present

## 2022-08-01 DIAGNOSIS — R5383 Other fatigue: Secondary | ICD-10-CM | POA: Diagnosis not present

## 2022-08-01 DIAGNOSIS — G4700 Insomnia, unspecified: Secondary | ICD-10-CM | POA: Diagnosis not present

## 2022-08-01 DIAGNOSIS — M542 Cervicalgia: Secondary | ICD-10-CM | POA: Diagnosis not present

## 2022-08-01 DIAGNOSIS — F419 Anxiety disorder, unspecified: Secondary | ICD-10-CM | POA: Diagnosis not present

## 2022-08-01 DIAGNOSIS — Z131 Encounter for screening for diabetes mellitus: Secondary | ICD-10-CM | POA: Diagnosis not present

## 2022-08-04 NOTE — Progress Notes (Signed)
Patient: Karen Edwards  Service Category: E/M  Provider: Gaspar Cola, MD  DOB: 1966/07/29  DOS: 08/08/2022  Location: Office  MRN: [id number]  Setting: Ambulatory outpatient  Referring Provider: Pleas Koch, NP  Type: Established Patient  Specialty: Interventional Pain Management  PCP: Pleas Koch, NP  Location: Remote location  Delivery: TeleHealth     Virtual Encounter - Pain Management PROVIDER NOTE: Information contained herein reflects review and annotations entered in association with encounter. Interpretation of such information and data should be left to medically-trained personnel. Information provided to patient can be located elsewhere in the medical record under "Patient Instructions". Document created using STT-dictation technology, any transcriptional errors that may result from process are unintentional.    Contact & Pharmacy Preferred: [phone] Home: [phone] (home) Mobile: [phone] (mobile) E-mail: [email or web address] .[email or web address], [place] [address] Phone: [phone] Fax: [phone]   Pre-screening  Karen Edwards offered "in-person" vs "virtual" encounter. She indicated preferring virtual for this encounter.   Reason COVID-19*  Social distancing based on CDC and AMA recommendations.   I contacted Karen Edwards on 08/08/2022 via telephone.      I clearly identified myself as Gaspar Cola, MD. I verified that I was speaking with the correct person using two identifiers (Name: Karen Edwards).  Consent I sought verbal advanced consent from Karen Edwards for virtual visit interactions. I informed Karen Edwards of possible security and privacy concerns, risks, and limitations associated with providing "not-in-person" medical evaluation and management services. I also informed Karen Edwards of the availability of "in-person"  appointments. Finally, I informed her that there would be a charge for the virtual visit and that she could be  personally, fully or partially, financially responsible for it. Karen Edwards expressed understanding and agreed to proceed.   Historic Elements   Karen Edwards is a 56 y.o. year old, female patient evaluated today after our last contact on 06/27/2022. Karen Edwards  has a past medical history of Anxiety, Chronic back pain, Degenerative joint disease, Depression, Headache, Hypotension, IBS (irritable bowel syndrome) (05/2015), and MVP (mitral valve prolapse). She also  has a past surgical history that includes Cesarean section; Ankle surgery (Right, 03/02/2014); Hemorrhoid surgery (N/A, 06/16/2015); Cervical spine surgery (02/21/2016); Cesarean section with bilateral tubal ligation (1990); Colonoscopy (04/19/2015); Hysteroscopy with D & C (12/24/2013); Colonoscopy with propofol (N/A, 05/18/2018); Esophagogastroduodenoscopy (egd) with propofol (N/A, 05/18/2018); Spinal cord stimulator insertion (N/A, 06/18/2018); Tubal ligation; and Lumbar spinal cord simulator revision (N/A, 03/14/2020). Karen Edwards has a current medication list which includes the following prescription(s): acetaminophen, cyclobenzaprine, fluoxetine, ondansetron, oxycodone, [START ON 08/19/2022] oxycodone, oxycodone-acetaminophen, scopolamine, tizanidine, trazodone, and oxycodone. She  reports that she has been smoking cigarettes. She has a 18.50 pack-year smoking history. She has never used smokeless tobacco. She reports current alcohol use of about 1.0 standard drink of alcohol per week. She reports that she does not use drugs. Karen Edwards is allergic to gabapentin.   HPI  Today, she is being contacted for a post-procedure assessment.  The patient indicates doing fairly well with an ongoing 100% relief of her left lower back pain.  In the case of the right lower back she refers that she still has some pain but it is towards the  lower buttocks area with some referred discomfort going down the back of the right leg to the level of the knee.  She denies any symptoms below that.  Based on the patient's current description of the discomfort on the right side, it would seem that she still has some discomfort coming from the area of the sacroiliac joint.  Today I have instructed the patient to let me know if there is continues or worsens in which case then we can do a diagnostic/therapeutic right SI joint injection and if this relieves the pain, we can consider doing radiofrequency ablation.  She understood and accepted.  Post-procedure evaluation   Type: Lumbar Facet, Medial Branch Radiofrequency Ablation (RFA) #3  Laterality: Left (-LT)  Level: L2, L3, L4, L5, & S1 Medial Branch Level(s). These levels will denervate the L3-4, L4-5 and L5-S1 lumbar facet joints.  Imaging: Fluoroscopy-guided         Anesthesia: Local anesthesia (1-2% Lidocaine) Anxiolysis: IV Versed         Sedation: Moderate Sedation              .  DOS: 06/27/2022  Performed by: Gaspar Cola, MD  Purpose: Therapeutic/Palliative Indications: Low back pain severe enough to impact quality of life or function. Indications: 1. 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 Spondylosis without myelopathy or radiculopathy, lumbosacral region   3. Lumbar facet hypertrophy (Bilateral)   4. DDD (degenerative disc disease), lumbar   5. Chronic low back pain (Bilateral) w/o sciatica   6. Presence of neurostimulator (Thoracolumbar)    Pain Score: Pre-procedure: 7 /10 Post-procedure: 0-No pain/10    Effectiveness:  Initial hour after procedure: 100 %. Subsequent 4-6 hours post-procedure: 100 % for the first week. Analgesia past initial 6 hours: 100 % (Currently). Ongoing improvement:  Analgesic: The patient indicates currently having a 100% relief of the pain on the left lower back.  She describes only having a tingling sensation left. Function: Ms.  Edwards reports improvement in function ROM: Karen Edwards reports improvement in ROM  Pharmacotherapy Assessment   Opioid Analgesic: Oxycodone IR 5 mg, 1 tab PO QD (5 mg/day of oxycodone) PRN MME/day: 7.5 mg/day.   Monitoring:  PMP: PDMP reviewed during this encounter.       Pharmacotherapy: No side-effects or adverse reactions reported. Compliance: No problems identified. Effectiveness: Clinically acceptable. Plan: Refer to "POC". UDS:  Summary  Date Value Ref Range Status  04/16/2021 Note  Final    Comment:    ==================================================================== [employer] Select 13 (MW) ==================================================================== Test                             Result       Flag       Units  Drug Absent but Declared for Prescription Verification   Oxycodone                      Not Detected UNEXPECTED ng/mg creat ==================================================================== Test                      Result    Flag   Units      Ref Range   Creatinine              41               mg/dL      >=20 ==================================================================== Declared Medications:  The flagging and interpretation on this report are based on the  following declared medications.  Unexpected results may arise from  inaccuracies in the declared medications.   **  Note: The testing scope of this panel includes these medications:   Oxycodone (Roxicodone)   **Note: The testing scope of this panel does not include the  following reported medications:   Acetaminophen (Tylenol)  Biotin  Buspirone (Buspar)  Fish Oil  Fluoxetine (Prozac)  Gabapentin (Neurontin)  Ondansetron (Zofran)  Scopolamine  Tizanidine  Trazodone (Desyrel)  Turmeric ==================================================================== For clinical consultation, please call [phone]. ====================================================================    No results found for: "CBDTHCR", "D8THCCBX", "D9THCCBX"   Laboratory Chemistry Profile   Renal Lab Results  Component Value Date   BUN 12 02/01/2021   CREATININE 0.85 02/01/2021   BCR 8 (L) 09/08/2017   GFR 77.34 02/01/2021   GFRAA 93 09/08/2017   GFRNONAA 81 09/08/2017    Hepatic Lab Results  Component Value Date   AST 15 02/01/2021   ALT 11 02/01/2021   ALBUMIN 4.4 02/01/2021   ALKPHOS 59 02/01/2021    Electrolytes Lab Results  Component Value Date   NA 139 02/01/2021   K 4.2 02/01/2021   CL 105 02/01/2021   CALCIUM 9.3 02/01/2021   MG 2.1 09/08/2017    Bone Lab Results  Component Value Date   VD25OH 43.74 01/10/2016   25OHVITD1 51 09/08/2017   25OHVITD2 <1.0 09/08/2017   25OHVITD3 51 09/08/2017    Inflammation (CRP: Acute Phase) (ESR: Chronic Phase) Lab Results  Component Value Date   CRP 1.4 09/08/2017   ESRSEDRATE 11 09/08/2017         Note: Above Lab results reviewed.  Imaging  DG PAIN CLINIC C-ARM 1-60 MIN NO REPORT Fluoro was used, but no Radiologist interpretation will be provided.  Please refer to "NOTES" tab for provider progress note.  Assessment  The primary encounter diagnosis was Chronic low back pain (Bilateral) w/o sciatica. Diagnoses of Chronic hip pain (3ry area of Pain) (Bilateral) (R>L), Lumbar facet syndrome (Bilateral) (L>R), Chronic pain syndrome, and Chronic sacroiliac joint pain (Right) were also pertinent to this visit.  Plan of Care  Problem-specific:  No problem-specific Assessment & Plan notes found for this encounter.  Ms. EVALEE GERARD has a current medication list which includes the following long-term medication(s): fluoxetine, oxycodone, [START ON 08/19/2022] oxycodone, trazodone, and oxycodone.  Pharmacotherapy (Medications Ordered): No orders of the defined types were placed in this encounter.  Orders:  No orders of the defined  types were placed in this encounter.  Follow-up plan:   No follow-ups on file.     Interventional Therapies  Risk  Complexity Considerations:   Estimated body mass index is 27.07 kg/m as calculated from the following:   Height as of this encounter: _0  (1.575 m).   Weight as of this encounter: 148 lb (67.1 kg). WNL   Planned  Pending:   Diagnostic/therapeutic right lower SI joint block #3    Under consideration:   Therapeutic right L1-2 & L3-4 percutaneous discectomy ([employer] compressor)  Therapeutic intrathecal pump trial    Completed:   Therapeutic right cervical ESI x1 (08/24/2020) (100/100/50/50)  Diagnostic bilateral GONB x1 (08/19/2019) (100/100/100/,50)  Palliative right cervical facet MBB x3 (07/23/2018) (100/100/100/>50)  Palliative left cervical facet MBB x4 (04/01/2019) (90/90/0/0)  Palliative right cervical facet RFA x1 (09/17/2018) (100/100/85/85)  Palliative left cervical facet RFA x1 (05/04/2019) (100/100/50/50)  Therapeutic left L1-2 LESI x1 (05/10/2021) (100/100/100 x 2 days/0)  Therapeutic right L1-2 LESI x2 (02/10/2018) (100/100/70/50-75)  Therapeutic right L1 TFESI x1 (02/10/2018) (100/100/70/50-75)  Diagnostic/therapeutic left superior cluneal NB (L2, L3 dorsal rami) x1 (04/24/2021) (0/0/0/0)  Diagnostic/therapeutic left  L1 TFESI x2 (11/15/2021) (100/100/0)  Diagnostic/therapeutic left L3 TFESI x2 (11/15/2021) (100/100/0)  Bilateral lumbar spinal cord stimulator trial (done) (05/14/2018) (by me)  Bilateral lumbar spinal cord stimulator implant (09/28/2019) (by me)  Bilateral spinal cord stimulator revision (03/14/2020) (by me)  Diagnostic bilateral lumbar facet MBB x3 (11/30/2019) (100/100/50/20)  Diagnostic bilateral SI joint Blk x2 (09/18/2021) (100/100/100)  Palliative left lumbar facet RFA x3 (06/27/2022) (100/100/100/100)  Therapeutic right lumbar facet RFA x3 (05/21/2022) (100/100/75/75)    Therapeutic  Palliative (PRN) options:   Palliative right cervical  facet RFA   Diagnostic bilateral SI joint Blk   Therapeutic bilateral lumbar facet RFA     Recent Visits Date Type Provider Dept  06/27/22 Procedure visit Milinda Pointer, MD [facility]  05/29/22 Office Visit Milinda Pointer, MD [facility]  05/21/22 Procedure visit Milinda Pointer, MD [facility]  Showing recent visits within past 90 days and meeting all other requirements Today's Visits Date Type Provider Dept  08/08/22 Office Visit Milinda Pointer, MD [facility]  Showing today's visits and meeting all other requirements Future Appointments Date Type Provider Dept  09/11/22 Appointment Milinda Pointer, MD [facility]  Showing future appointments within next 90 days and meeting all other requirements  I discussed the assessment and treatment plan with the patient. The patient was provided an opportunity to ask questions and all were answered. The patient agreed with the plan and demonstrated an understanding of the instructions.  Patient advised to call back or seek an in-person evaluation if the symptoms or condition worsens.  Duration of encounter: 12 minutes.  Note by: Gaspar Cola, MD Date: 08/08/2022; Time: 11:39 AM

## 2022-08-06 ENCOUNTER — Encounter: Payer: Self-pay | Admitting: Pain Medicine

## 2022-08-06 ENCOUNTER — Telehealth: Payer: Self-pay

## 2022-08-06 NOTE — Telephone Encounter (Signed)
LM for patient to call officr for pre virtual appointment questions. kt

## 2022-08-08 ENCOUNTER — Ambulatory Visit: Payer: Medicare PPO | Attending: Pain Medicine | Admitting: Pain Medicine

## 2022-08-08 DIAGNOSIS — M47816 Spondylosis without myelopathy or radiculopathy, lumbar region: Secondary | ICD-10-CM | POA: Diagnosis not present

## 2022-08-08 DIAGNOSIS — M25552 Pain in left hip: Secondary | ICD-10-CM | POA: Diagnosis not present

## 2022-08-08 DIAGNOSIS — M545 Low back pain, unspecified: Secondary | ICD-10-CM

## 2022-08-08 DIAGNOSIS — M533 Sacrococcygeal disorders, not elsewhere classified: Secondary | ICD-10-CM

## 2022-08-08 DIAGNOSIS — G8929 Other chronic pain: Secondary | ICD-10-CM | POA: Diagnosis not present

## 2022-08-08 DIAGNOSIS — G894 Chronic pain syndrome: Secondary | ICD-10-CM

## 2022-08-08 DIAGNOSIS — M25551 Pain in right hip: Secondary | ICD-10-CM | POA: Diagnosis not present

## 2022-08-19 ENCOUNTER — Telehealth: Payer: Self-pay | Admitting: Primary Care

## 2022-08-19 NOTE — Telephone Encounter (Signed)
Left message for patient to call back and schedule Medicare Annual Wellness Visit (AWV) either virtually or phone  . Left  my Karen Edwards number [phone]   *due 05/28/14 awvi per [employer]       45 min for awv-i and in office appointments 30 min for awv-s  phone/virtual appointments

## 2022-08-29 DIAGNOSIS — G4700 Insomnia, unspecified: Secondary | ICD-10-CM | POA: Diagnosis not present

## 2022-08-29 DIAGNOSIS — D72829 Elevated white blood cell count, unspecified: Secondary | ICD-10-CM | POA: Diagnosis not present

## 2022-09-10 NOTE — Progress Notes (Unsigned)
PROVIDER NOTE: Information contained herein reflects review and annotations entered in association with encounter. Interpretation of such information and data should be left to medically-trained personnel. Information provided to patient can be located elsewhere in the medical record under "Patient Instructions". Document created using STT-dictation technology, any transcriptional errors that may result from process are unintentional.    Patient: Karen Edwards  Service Category: E/M  Provider: Gaspar Cola, MD  DOB: 1966/06/10  DOS: 09/11/2022  Referring Provider: Pleas Koch, NP  MRN: [id number]  Specialty: Interventional Pain Management  PCP: Pleas Koch, NP  Type: Established Patient  Setting: Ambulatory outpatient    Location: Office  Delivery: Face-to-face     HPI  Karen Edwards, a 56 y.o. year old female, is here today because of her No primary diagnosis found.. Karen Edwards primary complain today is No chief complaint on file. Last encounter: My last encounter with her was on 08/08/2022. Pertinent problems: Karen Edwards has Chronic low back pain (1ry area of Pain) (Bilateral) (R>L) w/ sciatica (Bilateral); DDD (degenerative disc disease), lumbar; DDD (degenerative disc disease), cervical; Chronic occipital neuralgia (5th area of Pain) (Bilateral) (R>L); Lumbar facet syndrome (Bilateral) (L>R); DDD (degenerative disc disease), lumbosacral; Lumbar radicular pain (Right) (L4); Sacroiliac joint dysfunction (Bilateral); Chronic neck pain (4th area of Pain) (Bilateral) (R>L); Numbness and tingling of upper extremity (Right); Chronic upper extremity pain (Bilateral) (R>L); Chronic sacroiliac joint pain (Bilateral) (L>R); Lumbar facet hypertrophy (Bilateral); L1-2 disc extrusion (Right); Cervical foraminal stenosis (C5-6) (Bilateral); Chronic pain syndrome; Chronic cervical radicular pain (Right: C6/C7) (Left: C5/T1); Chronic lumbar radicular pain; Chronic hip pain (3ry area  of Pain) (Bilateral) (R>L); Chronic lower extremity pain (2ry area of Pain) (Bilateral) (R>L); Chronic shoulder pain (Bilateral) (L>R); Chronic musculoskeletal pain; Lumbar spondylosis; Cervical facet syndrome (Bilateral) (R>L); Myofascial pain; Spondylosis without myelopathy or radiculopathy, cervical region; Cervicalgia; S/P insertion of Epidural Neurostimulator (SCS) (Bilateral); Chronic neck pain with history of cervical spinal surgery; 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[facility]); Trigger point with back pain (Right); Cervicogenic headache (Bilateral); Occipital headache (Bilateral); Chronic tension-type headache, intractable; Cervico-occipital neuralgia (Bilateral); Pain due to any device, implant or graft (SCS battery) (Left PSIS area); Spondylosis without myelopathy or radiculopathy, lumbosacral region; History of cervical spinal surgery (C5-7 ACDF); Cervical radiculopathy at C6 (Bilateral); Chronic low back pain (Bilateral) w/o sciatica; Presence of neurostimulator (Thoracolumbar); Spinal cord stimulator dysfunction ([place]); Neurogenic pain; Chronic low back pain (Left) w/o sciatica (implant pain); Cluneal neuropathy (Left); Abnormal MRI, lumbar spine (04/17/2021); Enthesopathy of hip region (Bilateral); Protrusion of lumbar intervertebral disc (Right: L1-2, L3-4); 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 Pain Assessment: Severity of   is reported as a  /10. Location:    / . Onset:  . Quality:  . Timing:  . Modifying factor(s):  Marland Kitchen Vitals:  vitals were not taken for this visit.   Reason for encounter: medication management. ***  Pharmacotherapy Assessment  Analgesic: Oxycodone IR 5 mg, 1 tab PO QD (5 mg/day of oxycodone) PRN MME/day: 7.5 mg/day.   Monitoring: [place] PMP: PDMP reviewed during this encounter.       Pharmacotherapy: No  side-effects or adverse reactions reported. Compliance: No problems identified. Effectiveness: Clinically acceptable.  No notes on file  No results found for: "CBDTHCR" No results found for: "D8THCCBX" No results found for: "D9THCCBX"  UDS:  Summary  Date Value Ref Range Status  04/16/2021 Note  Final    Comment:    ==================================================================== [employer] Select 13 (MW) ==================================================================== Test                             Result       Flag       Units  Drug Absent but Declared for Prescription Verification   Oxycodone                      Not Detected UNEXPECTED ng/mg creat ==================================================================== Test                      Result    Flag   Units      Ref Range   Creatinine              41               mg/dL      >=20 ==================================================================== Declared Medications:  The flagging and interpretation on this report are based on the  following declared medications.  Unexpected results may arise from  inaccuracies in the declared medications.   **Note: The testing scope of this panel includes these medications:   Oxycodone (Roxicodone)   **Note: The testing scope of this panel does not include the  following reported medications:   Acetaminophen (Tylenol)  Biotin  Buspirone (Buspar)  Fish Oil  Fluoxetine (Prozac)  Gabapentin (Neurontin)  Ondansetron (Zofran)  Scopolamine  Tizanidine  Trazodone (Desyrel)  Turmeric ==================================================================== For clinical consultation, please call [phone]. ====================================================================       ROS  Constitutional: Denies any fever or chills Gastrointestinal: No reported hemesis, hematochezia, vomiting, or acute GI distress Musculoskeletal: Denies any acute onset joint swelling,  redness, loss of ROM, or weakness Neurological: No reported episodes of acute onset apraxia, aphasia, dysarthria, agnosia, amnesia, paralysis, loss of coordination, or loss of consciousness  Medication Review  FLUoxetine, acetaminophen, cyclobenzaprine, ondansetron, oxyCODONE, oxyCODONE-acetaminophen, scopolamine, tiZANidine, and traZODone  History Review  Allergy: Karen Edwards is allergic to gabapentin. Drug: Karen Edwards  reports no history of drug use. Alcohol:  reports current alcohol use of about 1.0 standard drink of alcohol per week. Tobacco:  reports that she has been smoking cigarettes. She has a 18.50 pack-year smoking history. She has never used smokeless tobacco. Social: Karen Edwards  reports that she has been smoking cigarettes. She has a 18.50 pack-year smoking history. She has never used smokeless tobacco. She reports current alcohol use of about 1.0 standard drink of alcohol per week. She reports that she does not use drugs. Medical:  has a past medical history of Anxiety, Chronic back pain, Degenerative joint disease, Depression, Headache, Hypotension, IBS (irritable bowel syndrome) (05/2015), and MVP (mitral valve prolapse). Surgical: Karen Edwards  has a past surgical history that includes Cesarean section; Ankle surgery (Right, 03/02/2014); Hemorrhoid surgery (N/A, 06/16/2015); Cervical spine surgery (02/21/2016); Cesarean section with bilateral tubal ligation (1990); Colonoscopy (04/19/2015); Hysteroscopy with D & C (12/24/2013); Colonoscopy with propofol (N/A, 05/18/2018); Esophagogastroduodenoscopy (egd) with propofol (N/A, 05/18/2018); Spinal cord stimulator insertion (N/A, 06/18/2018); Tubal ligation; and Lumbar spinal cord simulator revision (N/A, 03/14/2020). Family: family history includes Arthritis in her mother; Congestive Heart Failure in her maternal grandfather; Depression in her mother; Diabetes in her maternal aunt; Diabetes Mellitus I in her maternal grandmother; Gout in  her maternal aunt; Hearing loss in her father; Heart attack in her maternal grandfather; Hyperlipidemia in her father  and mother; Hypertension in her father and mother; Hypothyroidism in her mother; Kidney disease in her mother; Prostate cancer in her paternal grandfather; Stroke in her maternal grandmother; Thyroid disease in her maternal aunt; Vision loss in her mother.  Laboratory Chemistry Profile   Renal Lab Results  Component Value Date   BUN 12 02/01/2021   CREATININE 0.85 02/01/2021   BCR 8 (L) 09/08/2017   GFR 77.34 02/01/2021   GFRAA 93 09/08/2017   GFRNONAA 81 09/08/2017    Hepatic Lab Results  Component Value Date   AST 15 02/01/2021   ALT 11 02/01/2021   ALBUMIN 4.4 02/01/2021   ALKPHOS 59 02/01/2021    Electrolytes Lab Results  Component Value Date   NA 139 02/01/2021   K 4.2 02/01/2021   CL 105 02/01/2021   CALCIUM 9.3 02/01/2021   MG 2.1 09/08/2017    Bone Lab Results  Component Value Date   VD25OH 43.74 01/10/2016   25OHVITD1 51 09/08/2017   25OHVITD2 <1.0 09/08/2017   25OHVITD3 51 09/08/2017    Inflammation (CRP: Acute Phase) (ESR: Chronic Phase) Lab Results  Component Value Date   CRP 1.4 09/08/2017   ESRSEDRATE 11 09/08/2017         Note: Above Lab results reviewed.  Recent Imaging Review  DG PAIN CLINIC C-ARM 1-60 MIN NO REPORT Fluoro was used, but no Radiologist interpretation will be provided.  Please refer to "NOTES" tab for provider progress note. Note: Reviewed        Physical Exam  General appearance: Well nourished, well developed, and well hydrated. In no apparent acute distress Mental status: Alert, oriented x 3 (person, place, & time)       Respiratory: No evidence of acute respiratory distress Eyes: PERLA Vitals: LMP 05/05/2017 (Exact Date)  BMI: Estimated body mass index is 28.34 kg/m as calculated from the following:   Height as of 06/27/22: _0  (1.549 m).   Weight as of 06/27/22: 150 lb (68 kg). Ideal: Patient weight  not recorded  Assessment   Diagnosis Status  No diagnosis found. Controlled Controlled Controlled   Updated Problems: No problems updated.   Plan of Care  Problem-specific:  No problem-specific Assessment & Plan notes found for this encounter.  Karen Edwards has a current medication list which includes the following long-term medication(s): fluoxetine, oxycodone, oxycodone, oxycodone, and trazodone.  Pharmacotherapy (Medications Ordered): No orders of the defined types were placed in this encounter.  Orders:  No orders of the defined types were placed in this encounter.  Follow-up plan:   No follow-ups on file.     Interventional Therapies  Risk  Complexity Considerations:   Estimated body mass index is 27.07 kg/m as calculated from the following:   Height as of this encounter: _1  (1.575 m).   Weight as of this encounter: 148 lb (67.1 kg). WNL   Planned  Pending:   Diagnostic/therapeutic right lower SI joint block #3    Under consideration:   Therapeutic right L1-2 & L3-4 percutaneous discectomy ([employer] compressor)  Therapeutic intrathecal pump trial    Completed:   Therapeutic right cervical ESI x1 (08/24/2020) (100/100/50/50)  Diagnostic bilateral GONB x1 (08/19/2019) (100/100/100/,50)  Palliative right cervical facet MBB x3 (07/23/2018) (100/100/100/>50)  Palliative left cervical facet MBB x4 (04/01/2019) (90/90/0/0)  Palliative right cervical facet RFA x1 (09/17/2018) (100/100/85/85)  Palliative left cervical facet RFA x1 (05/04/2019) (100/100/50/50)  Therapeutic left L1-2 LESI x1 (05/10/2021) (100/100/100 x 2 days/0)  Therapeutic right L1-2 LESI x2 (  02/10/2018) (100/100/70/50-75)  Therapeutic right L1 TFESI x1 (02/10/2018) (100/100/70/50-75)  Diagnostic/therapeutic left superior cluneal NB (L2, L3 dorsal rami) x1 (04/24/2021) (0/0/0/0)  Diagnostic/therapeutic left L1 TFESI x2 (11/15/2021) (100/100/0)  Diagnostic/therapeutic left L3 TFESI x2 (11/15/2021)  (100/100/0)  Bilateral lumbar spinal cord stimulator trial (done) (05/14/2018) (by me)  Bilateral lumbar spinal cord stimulator implant (09/28/2019) (by me)  Bilateral spinal cord stimulator revision (03/14/2020) (by me)  Diagnostic bilateral lumbar facet MBB x3 (11/30/2019) (100/100/50/20)  Diagnostic bilateral SI joint Blk x2 (09/18/2021) (100/100/100)  Palliative left lumbar facet RFA x3 (06/27/2022) (100/100/100/100)  Therapeutic right lumbar facet RFA x3 (05/21/2022) (100/100/75/75)    Therapeutic  Palliative (PRN) options:   Palliative right cervical facet RFA   Diagnostic bilateral SI joint Blk   Therapeutic bilateral lumbar facet RFA      Recent Visits Date Type Provider Dept  08/08/22 Office Visit Milinda Pointer, MD [facility]  06/27/22 Procedure visit Milinda Pointer, MD [facility]  Showing recent visits within past 90 days and meeting all other requirements Future Appointments Date Type Provider Dept  09/11/22 Appointment Milinda Pointer, MD [facility]  Showing future appointments within next 90 days and meeting all other requirements  I discussed the assessment and treatment plan with the patient. The patient was provided an opportunity to ask questions and all were answered. The patient agreed with the plan and demonstrated an understanding of the instructions.  Patient advised to call back or seek an in-person evaluation if the symptoms or condition worsens.  Duration of encounter: *** minutes.  Total time on encounter, as per AMA guidelines included both the face-to-face and non-face-to-face time personally spent by the physician and/or other qualified health care professional(s) on the day of the encounter (includes time in activities that require the physician or other qualified health care professional and does not include time in activities normally performed by clinical staff). Physician's time may include the following  activities when performed: preparing to see the patient (eg, review of tests, pre-charting review of records) obtaining and/or reviewing separately obtained history performing a medically appropriate examination and/or evaluation counseling and educating the patient/family/caregiver ordering medications, tests, or procedures referring and communicating with other health care professionals (when not separately reported) documenting clinical information in the electronic or other health record independently interpreting results (not separately reported) and communicating results to the patient/ family/caregiver care coordination (not separately reported)  Note by: Gaspar Cola, MD Date: 09/11/2022; Time: 3:45 PM

## 2022-09-11 ENCOUNTER — Other Ambulatory Visit: Payer: Self-pay

## 2022-09-11 ENCOUNTER — Encounter: Payer: Self-pay | Admitting: Pain Medicine

## 2022-09-11 ENCOUNTER — Ambulatory Visit: Payer: Medicare PPO | Attending: Pain Medicine | Admitting: Pain Medicine

## 2022-09-11 VITALS — BP 123/79 | HR 73

## 2022-09-11 DIAGNOSIS — G8929 Other chronic pain: Secondary | ICD-10-CM | POA: Insufficient documentation

## 2022-09-11 DIAGNOSIS — G894 Chronic pain syndrome: Secondary | ICD-10-CM | POA: Insufficient documentation

## 2022-09-11 DIAGNOSIS — M25552 Pain in left hip: Secondary | ICD-10-CM | POA: Diagnosis present

## 2022-09-11 DIAGNOSIS — Z79899 Other long term (current) drug therapy: Secondary | ICD-10-CM | POA: Insufficient documentation

## 2022-09-11 DIAGNOSIS — M47816 Spondylosis without myelopathy or radiculopathy, lumbar region: Secondary | ICD-10-CM | POA: Diagnosis not present

## 2022-09-11 DIAGNOSIS — M79605 Pain in left leg: Secondary | ICD-10-CM | POA: Insufficient documentation

## 2022-09-11 DIAGNOSIS — M542 Cervicalgia: Secondary | ICD-10-CM | POA: Diagnosis not present

## 2022-09-11 DIAGNOSIS — M5441 Lumbago with sciatica, right side: Secondary | ICD-10-CM | POA: Insufficient documentation

## 2022-09-11 DIAGNOSIS — M79604 Pain in right leg: Secondary | ICD-10-CM | POA: Diagnosis not present

## 2022-09-11 DIAGNOSIS — M5442 Lumbago with sciatica, left side: Secondary | ICD-10-CM | POA: Diagnosis not present

## 2022-09-11 DIAGNOSIS — M5481 Occipital neuralgia: Secondary | ICD-10-CM | POA: Insufficient documentation

## 2022-09-11 DIAGNOSIS — Z79891 Long term (current) use of opiate analgesic: Secondary | ICD-10-CM | POA: Insufficient documentation

## 2022-09-11 DIAGNOSIS — M25551 Pain in right hip: Secondary | ICD-10-CM | POA: Insufficient documentation

## 2022-09-11 MED ORDER — NALOXONE HCL 4 MG/0.1ML NA LIQD: 1.0000 | NASAL | 0 refills | Status: AC | PRN

## 2022-09-11 MED ORDER — OXYCODONE HCL 5 MG PO TABS: 5.0000 mg | ORAL_TABLET | Freq: Two times a day (BID) | ORAL | 0 refills | Status: DC | PRN

## 2022-09-11 NOTE — Progress Notes (Signed)
Nursing Pain Medication Assessment:  Safety precautions to be maintained throughout the outpatient stay will include: orient to surroundings, keep bed in low position, maintain call bell within reach at all times, provide assistance with transfer out of bed and ambulation.  Medication Inspection Compliance: Pill count conducted under aseptic conditions, in front of the patient. Neither the pills nor the bottle was removed from the patient's sight at any time. Once count was completed pills were immediately returned to the patient in their original bottle.  Medication: Hydrocodone/APAP Pill/Patch Count:  4 of 21 pills remain Pill/Patch Appearance: Markings consistent with prescribed medication Bottle Appearance: Standard pharmacy container. Clearly labeled. Filled Date: 8 / 43 / 2023 Last Medication intake:  YesterdaySafety precautions to be maintained throughout the outpatient stay will include: orient to surroundings, keep bed in low position, maintain call bell within reach at all times, provide assistance with transfer out of bed and ambulation.

## 2022-09-11 NOTE — Patient Instructions (Signed)
____________________________________________________________________________________________  Patient Information update  To: All of our patients.  Re: Name change.  It has been made official that our current name, "[facility]"   will soon be changed to "[facility]".   The purpose of this change is to eliminate any confusion created by the concept of our practice being a "Medication Management Pain Clinic". In the past this has led to the misconception that we treat pain primarily by the use of prescription medications.  Nothing can be farther from the truth.   Understanding PAIN MANAGEMENT: To further understand what our practice does, you first have to understand that "Pain Management" is a subspecialty that requires additional training once a physician has completed their specialty training, which can be in either Anesthesia, Neurology, Psychiatry, or Physical Medicine and Rehabilitation (PMR). Each one of these contributes to the final approach taken by each physician to the management of their patient's pain. To be a "Pain Management Specialist" you must have first completed one of the specialty trainings below.  Anesthesiologists - trained in clinical pharmacology and interventional techniques such as nerve blockade and regional as well as central neuroanatomy. They are trained to block pain before, during, and after surgical interventions.  Neurologists - trained in the diagnosis and pharmacological treatment of complex neurological conditions, such as Multiple Sclerosis, Parkinson's, spinal cord injuries, and other systemic conditions that may be associated with symptoms that may include but are not limited to pain. They tend to rely primarily on the treatment of chronic pain using prescription medications.  Psychiatrist - trained in conditions affecting the psychosocial wellbeing  of patients including but not limited to depression, anxiety, schizophrenia, personality disorders, addiction, and other substance use disorders that may be associated with chronic pain. They tend to rely primarily on the treatment of chronic pain using prescription medications.   Physical Medicine and Rehabilitation (PMR) physicians, also known as physiatrists - trained to treat a wide variety of medical conditions affecting the brain, spinal cord, nerves, bones, joints, ligaments, muscles, and tendons. Their training is primarily aimed at treating patients that have suffered injuries that have caused severe physical impairment. Their training is primarily aimed at the physical therapy and rehabilitation of those patients. They may also work alongside orthopedic surgeons or neurosurgeons using their expertise in assisting surgical patients to recover after their surgeries.  INTERVENTIONAL PAIN MANAGEMENT is sub-subspecialty of Pain Management.  Our physicians are Board-certified in Anesthesia, Pain Management, and Interventional Pain Management.  This meaning that not only have they been trained and Board-certified in their specialty of Anesthesia, and subspecialty of Pain Management, but they have also received further training in the sub-subspecialty of Interventional Pain Management, in order to become Board-certified as INTERVENTIONAL PAIN MANAGEMENT SPECIALIST.    Mission: Our goal is to use our skills in  INTERVENTIONAL PAIN MANAGEMENT as alternatives to the chronic use of prescription opioid medications for the treatment of pain. To make this more clear, we have changed our name to reflect what we do and offer. We will continue to offer medication management assessment and recommendations, but we will not be taking over any patient's medication management.  ____________________________________________________________________________________________   _______________________________________________________________________  Medication Rules  Purpose: To inform patients, and their family members, of our medication rules and regulations.  Applies to: All patients receiving prescriptions from our practice (written or electronic).  Pharmacy of record: This is the pharmacy where your electronic prescriptions will be sent.   Make sure we have the correct one.  Electronic prescriptions: In compliance with the [place] Strengthen Opioid Misuse Prevention (STOP) Act of 2017 (Session Law 2017-74/H243), effective October 28, 2018, all controlled substances must be electronically prescribed. Written prescriptions, faxing, or calling prescriptions to a pharmacy will no longer be done.  Prescription refills: These will be provided only during in-person appointments. No medications will be renewed without a "face-to-face" evaluation with your provider. Applies to all prescriptions.  NOTE: The following applies primarily to controlled substances (Opioid* Pain Medications).   Type of encounter (visit): For patients receiving controlled substances, face-to-face visits are required. (Not an option and not up to the patient.)  Patient's responsibilities: Pain Pills: Bring all pain pills to every appointment (except for procedure appointments). Pill Bottles: Bring pills in original pharmacy bottle. Bring bottle, even if empty. Always bring the bottle of the most recent fill.  Medication refills: You are responsible for knowing and keeping track of what medications you are taking and when is it that you will need a refill. The day before your appointment: write a list of all prescriptions that need to be refilled. The day of the appointment: give the list to the admitting nurse. Prescriptions will be written only during appointments. No prescriptions will be written on procedure days. If you forget a medication: it will not be "Called in", "Faxed", or  "electronically sent". You will need to get another appointment to get these prescribed. No early refills. Do not call asking to have your prescription filled early. Partial  or short prescriptions: Occasionally your pharmacy may not have enough pills to fill your prescription.  NEVER ACCEPT a partial fill or a prescription that is short of the total amount of pills that you were prescribed.  With controlled substances the law allows 72 hours for the pharmacy to complete the prescription.  If the prescription is not completed within 72 hours, the pharmacist will require a new prescription to be written. This means that you will be short on your medicine and we WILL NOT send another prescription to complete your original prescription.  Instead, request the pharmacy to send a carrier to a nearby branch to get enough medication to provide you with your full prescription. Prescription Accuracy: You are responsible for carefully inspecting your prescriptions before leaving our office. Have the discharge nurse carefully go over each prescription with you, before taking them home. Make sure that your name is accurately spelled, that your address is correct. Check the name and dose of your medication to make sure it is accurate. Check the number of pills, and the written instructions to make sure they are clear and accurate. Make sure that you are given enough medication to last until your next medication refill appointment. Taking Medication: Take medication as prescribed. When it comes to controlled substances, taking less pills or less frequently than prescribed is permitted and encouraged. Never take more pills than instructed. Never take the medication more frequently than prescribed.  Inform other Doctors: Always inform, all of your healthcare providers, of all the medications you take. Pain Medication from other Providers: You are not allowed to accept any additional pain medication from any other Doctor or  Healthcare provider. There are two exceptions to this rule. (see below) In the event that you require additional pain medication, you are responsible for notifying us, as stated below. Cough Medicine: Often these contain an opioid, such as codeine or hydrocodone. Never accept or take cough medicine containing these opioids if   you are already taking an opioid* medication. The combination may cause respiratory failure and death. Medication Agreement: You are responsible for carefully reading and following our Medication Agreement. This must be signed before receiving any prescriptions from our practice. Safely store a copy of your signed Agreement. Violations to the Agreement will result in no further prescriptions. (Additional copies of our Medication Agreement are available upon request.) Laws, Rules, & Regulations: All patients are expected to follow all Federal and State Laws, Statutes, Rules, & Regulations. Ignorance of the Laws does not constitute a valid excuse.  Illegal drugs and Controlled Substances: The use of illegal substances (including, but not limited to marijuana and its derivatives) and/or the illegal use of any controlled substances is strictly prohibited. Violation of this rule may result in the immediate and permanent discontinuation of any and all prescriptions being written by our practice. The use of any illegal substances is prohibited. Adopted CDC guidelines & recommendations: Target dosing levels will be at or below 60 MME/day. Use of benzodiazepines** is not recommended.  Exceptions: There are only two exceptions to the rule of not receiving pain medications from other Healthcare Providers. Exception #1 (Emergencies): In the event of an emergency (i.e.: accident requiring emergency care), you are allowed to receive additional pain medication. However, you are responsible for: As soon as you are able, call our office [phone], at any time of the day or night, and leave a  message stating your name, the date and nature of the emergency, and the name and dose of the medication prescribed. In the event that your call is answered by a member of our staff, make sure to document and save the date, time, and the name of the person that took your information.  Exception #2 (Planned Surgery): In the event that you are scheduled by another doctor or dentist to have any type of surgery or procedure, you are allowed (for a period no longer than 30 days), to receive additional pain medication, for the acute post-op pain. However, in this case, you are responsible for picking up a copy of our "Post-op Pain Management for Surgeons" handout, and giving it to your surgeon or dentist. This document is available at our office, and does not require an appointment to obtain it. Simply go to our office during business hours (Monday-Thursday from 8:00 AM to 4:00 PM) (Friday 8:00 AM to 12:00 Noon) or if you have a scheduled appointment with us, prior to your surgery, and ask for it by name. In addition, you are responsible for: calling our office [phone], at any time of the day or night, and leaving a message stating your name, name of your surgeon, type of surgery, and date of procedure or surgery. Failure to comply with your responsibilities may result in termination of therapy involving the controlled substances. Medication Agreement Violation. Following the above rules, including your responsibilities will help you in avoiding a Medication Agreement Violation ("Breaking your Pain Medication Contract").  Consequences:  Not following the above rules may result in permanent discontinuation of medication prescription therapy.  *Opioid medications include: morphine, codeine, oxycodone, oxymorphone, hydrocodone, hydromorphone, meperidine, tramadol, tapentadol, buprenorphine, fentanyl, methadone. **Benzodiazepine medications include: diazepam (Valium), alprazolam (Xanax), clonazepam (Klonopine),  lorazepam (Ativan), clorazepate (Tranxene), chlordiazepoxide (Librium), estazolam (Prosom), oxazepam (Serax), temazepam (Restoril), triazolam (Halcion) (Last updated: 08/20/2022) ______________________________________________________________________   ______________________________________________________________________  Medication Recommendations and Reminders  Applies to: All patients receiving prescriptions (written and/or electronic).  Medication Rules & Regulations: You are responsible for reading, knowing, and   following our "Medication Rules" document. These exist for your safety and that of others. They are not flexible and neither are we. Dismissing or ignoring them is an act of "non-compliance" that may result in complete and irreversible termination of such medication therapy. For safety reasons, "non-compliance" will not be tolerated. As with the [place] fundamental legal principle of "ignorance of the law is no defense", we will accept no excuses for not having read and knowing the content of documents provided to you by our practice.  Pharmacy of record:  Definition: This is the pharmacy where your electronic prescriptions will be sent.  We do not endorse any particular pharmacy. It is up to you and your insurance to decide what pharmacy to use.  We do not restrict you in your choice of pharmacy. However, once we write for your prescriptions, we will NOT be re-sending more prescriptions to fix restricted supply problems created by your pharmacy, or your insurance.  The pharmacy listed in the electronic medical record should be the one where you want electronic prescriptions to be sent. If you choose to change pharmacy, simply notify our nursing staff. Changes will be made only during your regular appointments and not over the phone.  Recommendations: Keep all of your pain medications in a safe place, under lock and key, even if you live alone. We will NOT replace lost, stolen, or  damaged medication. We do not accept "Police Reports" as proof of medications having been stolen. After you fill your prescription, take 1 week's worth of pills and put them away in a safe place. You should keep a separate, properly labeled bottle for this purpose. The remainder should be kept in the original bottle. Use this as your primary supply, until it runs out. Once it's gone, then you know that you have 1 week's worth of medicine, and it is time to come in for a prescription refill. If you do this correctly, it is unlikely that you will ever run out of medicine. To make sure that the above recommendation works, it is very important that you make sure your medication refill appointments are scheduled at least 1 week before you run out of medicine. To do this in an effective manner, make sure that you do not leave the office without scheduling your next medication management appointment. Always ask the nursing staff to show you in your prescription , when your medication will be running out. Then arrange for the receptionist to get you a return appointment, at least 7 days before you run out of medicine. Do not wait until you have 1 or 2 pills left, to come in. This is very poor planning and does not take into consideration that we may need to cancel appointments due to bad weather, sickness, or emergencies affecting our staff. DO NOT ACCEPT A "Partial Fill": If for any reason your pharmacy does not have enough pills/tablets to completely fill or refill your prescription, do not allow for a "partial fill". The law allows the pharmacy to complete that prescription within 72 hours, without requiring a new prescription. If they do not fill the rest of your prescription within those 72 hours, you will need a separate prescription to fill the remaining amount, which we will NOT provide. If the reason for the partial fill is your insurance, you will need to talk to the pharmacist about payment alternatives for  the remaining tablets, but again, DO NOT ACCEPT A PARTIAL FILL, unless you can trust your pharmacist to obtain   the remainder of the pills within 72 hours.  Prescription refills and/or changes in medication(s):  Prescription refills, and/or changes in dose or medication, will be conducted only during scheduled medication management appointments. (Applies to both, written and electronic prescriptions.) No refills on procedure days. No medication will be changed or started on procedure days. No changes, adjustments, and/or refills will be conducted on a procedure day. Doing so will interfere with the diagnostic portion of the procedure. No phone refills. No medications will be "called into the pharmacy". No Fax refills. No weekend refills. No Holliday refills. No after hours refills.  Remember:  Business hours are:  Monday to Thursday 8:00 AM to 4:00 PM Provider's Schedule: Benisha Hadaway, MD - Appointments are:  Medication management: Monday and Wednesday 8:00 AM to 4:00 PM Procedure day: Tuesday and Thursday 7:30 AM to 4:00 PM Bilal Lateef, MD - Appointments are:  Medication management: Tuesday and Thursday 8:00 AM to 4:00 PM Procedure day: Monday and Wednesday 7:30 AM to 4:00 PM (Last update: 08/20/2022) ______________________________________________________________________  ____________________________________________________________________________________________  Pharmacy Shortages of Pain Medication   Introduction Shockingly as it may seem, .  "No U.S. Supreme Court decision has ever interpreted the Constitution as guaranteeing a right to health care for all Americans." - https://www.healthequityandpolicylab.com/elusive-right-to-health-care-under-us-law  "With respect to human rights, the [place] has no formally codified right to health, nor does it participate in a human rights treaty that specifies a right to health." - Scott J. Schweikart, JD, MBE  Situation By  now, most of our patients have had the experience of being told by their pharmacist that they do not have enough medication to cover their prescription. If you have not had this experience, just know that you soon will.  Problem There appears to be a shortage of these medications, either at the national level or locally. This is happening with all pharmacies. When there is not enough medication, patients are offered a partial fill and they are told that they will try to get the rest of the medicine for them at a later time. If they do not have enough for even a partial fill, the pharmacists are telling the patients to call us (the prescribing physicians) to request that we send another prescription to another pharmacy to get the medicine.   This reordering of a controlled substance creates documentation problems where additional paperwork needs to be created to explain why two prescriptions for the same period of time and the same medicine are being prescribed to the same patient. It also creates situations where the last appointment note does not accurately reflect when and what prescriptions were given to a patient. This leads to prescribing errors down the line, in subsequent follow-up visits.   [place] Board of Pharmacy ([place]BOP) Research revealed that Board of Pharmacy Rule .1806 (21 NCAC 46.1806) authorizes pharmacists to the transfer of prescriptions among pharmacies, and it sets forth procedural and recordkeeping requirements for doing so. However, this requires the pharmacist to complete the previously mentioned procedural paperwork to accomplish the transfer. As it turns out, it is much easier for them to have the prescribing physicians do the work.   Possible solutions 1. You can ask your physician to assist you in weaning yourself off these medications. 2. Ask your pharmacy if the medication is in stock, 3 days prior to your refill. 3. If you need a pharmacy change, let us know at your  medication management visit. Prescriptions that have already been electronically sent to a pharmacy will   not be re-sent to a different pharmacy if your pharmacy of record does not have it in stock. Proper stocking of medication is a pharmacy problem, not a prescriber problem. Work with your pharmacist to solve the problem. 4. Have the [place] State Assembly add a provision to the "STOP ACT" (the law that mandates how controlled substances are prescribed) where there is an exception to the electronic prescribing rule that states that in the event there are shortages of medications the physicians are allowed to use written prescriptions as opposed to electronic ones. This would allow patients to take their prescriptions to a different pharmacy that may have enough medication available to fill the prescription. The problem is that currently there is a law that does not allow for written prescriptions, with the exception of instances where the electronic medical record is down due to technical issues.  5. Have [place] Congress ease the pressure on pharmaceutical companies, allowing them to produce enough quantities of the medication to adequately supply the population. 6. Have pharmacies keep enough stocks of these medications to cover their client base.  7. Have the [place] State Assembly add a provision to the "STOP ACT" where they ease the regulations surrounding the transfer of controlled substances between pharmacies, so as to simplify the transfer of supplies. As an alternative, develop a system to allow patients to obtain the remainder of their prescription at another one of their pharmacies or at an associate pharmacy.   How this shortage will affect you.  Understand that this is a pharmacy supply problem, not a prescriber problem. Work with your pharmacy to solve it. The job of the prescriber is to evaluate and monitor the patient for the appropriate indications and use of these medicines. It is  not the job of the prescriber to supply the medication or to solve problems with that supply. The responsibility and the choice to obtain the medication resides on the patient. By law, supplying the medication is the job of the pharmacy. It is certainly not the job of the prescriber to solve supply problems.   Due to the above problems we are no longer taking patients to write for their pain medication. Future discussions with your physician may include potentially weaning medications or transitioning to alternatives.  We will be focusing primarily on interventional based pain management. We will continue to evaluate for appropriate indications and we may provide recommendations regarding medication, dose, and schedule, as well as monitoring recommendations, however, we will not be taking over the actual prescribing of these substances. On those patients where we are treating their chronic pain with interventional therapies, exceptions will be considered on a case by case basis. At this time, we will try to continue providing this supplemental service to those patients we have been managing in the past. However, as of August 1st, 2023, we no longer will be sending additional prescriptions to other pharmacies for the purpose of solving their supply problems. Once we send a prescription to a pharmacy, we will not be resending it again to another pharmacy to cover for their shortages.   What to do. Write as many letters as you can. Recruit the help of family members in writing these letters. Below are some of the places where you can write to make your voice heard. Let them know what the problem is and push them to look for solutions.   Search internet for: "[place] find your legislators" https://www.ncleg.gov/findyourlegislators  Search internet for: "[place] insurance commissioner   complaints" Starlas.fi  Search internet for: "[place] complaints" https://www.hernandez-brewer.com/.htm  Search internet for: "[facility] pharmacy complaints" Email [facility] Customer Relations woondaal.com.jsp?callType=store  Search internet for: Programme researcher, broadcasting/film/video customer service complaints" https://www.[facility].com/topic/marketing/contactus/contactus_customerservice.jsp  ____________________________________________________________________________________________  ____________________________________________________________________________________________  Drug Holidays (Slow)  What is a "Drug Holiday"? Drug Holiday: is the name given to the period of time during which a patient stops taking a medication(s) for the purpose of eliminating tolerance to the drug.  Benefits Improved effectiveness of opioids. Decreased opioid dose needed to achieve benefits. Improved pain with lesser dose.  What is tolerance? Tolerance: is the progressive decreased in effectiveness of a drug due to its repetitive use. With repetitive use, the body gets use to the medication and as a consequence, it loses its effectiveness. This is a common problem seen with opioid pain medications. As a result, a larger dose of the drug is needed to achieve the same effect that used to be obtained with a smaller dose.  How long should a "Drug Holiday" last? You should stay off of the pain medicine for at least 14 consecutive days. (2 weeks)  Should I stop the medicine "cold [place]"? No. You should always coordinate with your Pain Specialist so that he/she can provide you with the correct medication dose to make the transition as smoothly as possible.  How do I stop the medicine? Slowly. You will be instructed to decrease the daily amount of pills that you take by one (1) pill every seven (7) days. This is called a "slow downward taper" of your dose. For example: if you normally take four (4) pills per day, you will  be asked to drop this dose to three (3) pills per day for seven (7) days, then to two (2) pills per day for seven (7) days, then to one (1) per day for seven (7) days, and at the end of those last seven (7) days, this is when the "Drug Holiday" would start.   Will I have withdrawals? By doing a "slow downward taper" like this one, it is unlikely that you will experience any significant withdrawal symptoms. Typically, what triggers withdrawals is the sudden stop of a high dose opioid therapy. Withdrawals can usually be avoided by slowly decreasing the dose over a prolonged period of time. If you do not follow these instructions and decide to stop your medication abruptly, withdrawals may be possible.  What are withdrawals? Withdrawals: refers to the wide range of symptoms that occur after stopping or dramatically reducing opiate drugs after heavy and prolonged use. Withdrawal symptoms do not occur to patients that use low dose opioids, or those who take the medication sporadically. Contrary to benzodiazepine (example: Valium, Xanax, etc.) or alcohol withdrawals ("Delirium Tremens"), opioid withdrawals are not lethal. Withdrawals are the physical manifestation of the body getting rid of the excess receptors.  Expected Symptoms Early symptoms of withdrawal may include: Agitation Anxiety Muscle aches Increased tearing Insomnia Runny nose Sweating Yawning  Late symptoms of withdrawal may include: Abdominal cramping Diarrhea Dilated pupils Goose bumps Nausea Vomiting  Will I experience withdrawals? Due to the slow nature of the taper, it is very unlikely that you will experience any.  What is a slow taper? Taper: refers to the gradual decrease in dose.  (Last update: 05/17/2020) ____________________________________________________________________________________________   ____________________________________________________________________________________________  Naloxone Nasal  Spray  Why am I receiving this medication? [place] STOP ACT requires that all patients taking high dose opioids or at risk of opioids respiratory depression, be  prescribed an opioid reversal agent, such as Naloxone (AKA: Narcan).  What is this medication? NALOXONE (nal OX one) treats opioid overdose, which causes slow or shallow breathing, severe drowsiness, or trouble staying awake. Call emergency services after using this medication. You may need additional treatment. Naloxone works by reversing the effects of opioids. It belongs to a group of medications called opioid blockers.  COMMON BRAND NAME(S): Kloxxado, Narcan  What should I tell my care team before I take this medication? They need to know if you have any of these conditions: Heart disease Substance use disorder An unusual or allergic reaction to naloxone, other medications, foods, dyes, or preservatives Pregnant or trying to get pregnant Breast-feeding  When to use this medication? This medication is to be used for the treatment of respiratory depression (less than 8 breaths per minute) secondary to opioid overdose.   How to use this medication? This medication is for use in the nose. Lay the person on their back. Support their neck with your hand and allow the head to tilt back before giving the medication. The nasal spray should be given into 1 nostril. After giving the medication, move the person onto their side. Do not remove or test the nasal spray until ready to use. Get emergency medical help right away after giving the first dose of this medication, even if the person wakes up. You should be familiar with how to recognize the signs and symptoms of a narcotic overdose. If more doses are needed, give the additional dose in the other nostril. Talk to your care team about the use of this medication in children. While this medication may be prescribed for children as young as newborns for selected conditions, precautions  do apply.  Naloxone Overdosage: If you think you have taken too much of this medicine contact a poison control center or emergency room at once.  NOTE: This medicine is only for you. Do not share this medicine with others.  What if I miss a dose? This does not apply.  What may interact with this medication? This is only used during an emergency. No interactions are expected during emergency use. This list may not describe all possible interactions. Give your health care provider a list of all the medicines, herbs, non-prescription drugs, or dietary supplements you use. Also tell them if you smoke, drink alcohol, or use illegal drugs. Some items may interact with your medicine.  What should I watch for while using this medication? Keep this medication ready for use in the case of an opioid overdose. Make sure that you have the phone number of your care team and local hospital ready. You may need to have additional doses of this medication. Each nasal spray contains a single dose. Some emergencies may require additional doses. After use, bring the treated person to the nearest hospital or call 911. Make sure the treating care team knows that the person has received a dose of this medication. You will receive additional instructions on what to do during and after use of this medication before an emergency occurs.  What side effects may I notice from receiving this medication? Side effects that you should report to your care team as soon as possible: Allergic reactions--skin rash, itching, hives, swelling of the face, lips, tongue, or throat Side effects that usually do not require medical attention (report these to your care team if they continue or are bothersome): Constipation Dryness or irritation inside the nose Headache Increase in blood pressure Muscle spasms Stuffy  nose Toothache This list may not describe all possible side effects. Call your doctor for medical advice about side  effects. You may report side effects to FDA at 1-800-FDA-1088.  Where should I keep my medication? Because this is an emergency medication, you should keep it with you at all times.  Keep out of the reach of children and pets. Store between 20 and 25 degrees C (68 and 77 degrees F). Do not freeze. Throw away any unused medication after the expiration date. Keep in original box until ready to use.  NOTE: This sheet is a summary. It may not cover all possible information. If you have questions about this medicine, talk to your doctor, pharmacist, or health care provider.   2023 [employer] Standard (2021-06-22 00:00:00)  ____________________________________________________________________________________________

## 2022-09-14 ENCOUNTER — Other Ambulatory Visit: Payer: Self-pay | Admitting: Primary Care

## 2022-09-14 DIAGNOSIS — F32A Depression, unspecified: Secondary | ICD-10-CM

## 2022-09-15 LAB — TOXASSURE SELECT 13 (MW), URINE

## 2022-09-15 NOTE — Telephone Encounter (Signed)
Patient is due for her annual follow up. She will need to be soon, in person (not virtual) for further refills. Let me know once scheduled.

## 2022-09-16 NOTE — Telephone Encounter (Signed)
Lvmtcb, sent mychart message  

## 2022-09-16 NOTE — Telephone Encounter (Signed)
Spoke with patient. She has moved to [place] and has a new provider there and will get it refilled. She did say thank you.

## 2022-10-03 DIAGNOSIS — D72829 Elevated white blood cell count, unspecified: Secondary | ICD-10-CM | POA: Diagnosis not present

## 2022-10-03 DIAGNOSIS — M2550 Pain in unspecified joint: Secondary | ICD-10-CM | POA: Diagnosis not present

## 2022-10-03 DIAGNOSIS — F17210 Nicotine dependence, cigarettes, uncomplicated: Secondary | ICD-10-CM | POA: Diagnosis not present

## 2022-10-09 DIAGNOSIS — Z9682 Presence of neurostimulator: Secondary | ICD-10-CM | POA: Diagnosis not present

## 2022-10-09 DIAGNOSIS — D259 Leiomyoma of uterus, unspecified: Secondary | ICD-10-CM | POA: Diagnosis not present

## 2022-10-09 DIAGNOSIS — E278 Other specified disorders of adrenal gland: Secondary | ICD-10-CM | POA: Diagnosis not present

## 2022-10-09 DIAGNOSIS — D72829 Elevated white blood cell count, unspecified: Secondary | ICD-10-CM | POA: Diagnosis not present

## 2022-10-09 DIAGNOSIS — J9811 Atelectasis: Secondary | ICD-10-CM | POA: Diagnosis not present

## 2022-10-09 DIAGNOSIS — R9389 Abnormal findings on diagnostic imaging of other specified body structures: Secondary | ICD-10-CM | POA: Diagnosis not present

## 2022-10-15 DIAGNOSIS — F17210 Nicotine dependence, cigarettes, uncomplicated: Secondary | ICD-10-CM | POA: Diagnosis not present

## 2022-10-15 DIAGNOSIS — M899 Disorder of bone, unspecified: Secondary | ICD-10-CM | POA: Diagnosis not present

## 2022-10-15 DIAGNOSIS — D72829 Elevated white blood cell count, unspecified: Secondary | ICD-10-CM | POA: Diagnosis not present

## 2022-11-06 ENCOUNTER — Telehealth: Payer: Self-pay | Admitting: Primary Care

## 2022-11-06 NOTE — Telephone Encounter (Signed)
Clled to schedule AWV,LABS,CPE. Patient stated the she is seeing a doctor in [place]. Sent message to PCP.

## 2022-12-01 NOTE — Progress Notes (Signed)
PROVIDER NOTE: Information contained herein reflects review and annotations entered in association with encounter. Interpretation of such information and data should be left to medically-trained personnel. Information provided to patient can be located elsewhere in the medical record under "Patient Instructions". Document created using STT-dictation technology, any transcriptional errors that may result from process are unintentional.    Patient: Karen Edwards  Service Category: E/M  Provider: Oswaldo Done, MD  DOB: 23-Aug-1966  DOS: 12/04/2022  Referring Provider: Doreene Nest, NP  MRN: [id number]  Specialty: Interventional Pain Management  PCP: Doreene Nest, NP  Type: Established Patient  Setting: Ambulatory outpatient    Location: Office  Delivery: Face-to-face     HPI  Karen Edwards, a 57 y.o. year old female, is here today because of her Chronic pain syndrome [G89.4]. Karen Edwards primary complain today is Neck Pain Last encounter: My last encounter with her was on 09/11/2022. Pertinent problems: Karen Edwards has Chronic low back pain (1ry area of Pain) (Bilateral) (R>L) w/ sciatica (Bilateral); DDD (degenerative disc disease), lumbar; DDD (degenerative disc disease), cervical; Chronic occipital neuralgia (5th area of Pain) (Bilateral) (R>L); Lumbar facet syndrome (Bilateral) (L>R); DDD (degenerative disc disease), lumbosacral; Lumbar radicular pain (Right) (L4); Sacroiliac joint dysfunction (Bilateral); Chronic neck pain (4th area of Pain) (Bilateral) (R>L); Numbness and tingling of upper extremity (Right); Chronic upper extremity pain (Bilateral) (R>L); Chronic sacroiliac joint pain (Bilateral) (L>R); Lumbar facet hypertrophy (Bilateral); L1-2 disc extrusion (Right); Cervical foraminal stenosis (C5-6) (Bilateral); Chronic pain syndrome; Chronic cervical radicular pain (Right: C6/C7) (Left: C5/T1); Chronic lumbar radicular pain; Chronic hip pain (3ry area of Pain)  (Bilateral) (R>L); Chronic lower extremity pain (2ry area of Pain) (Bilateral) (R>L); Chronic shoulder pain (Bilateral) (L>R); Chronic musculoskeletal pain; Lumbar spondylosis; Cervical facet syndrome (Bilateral) (R>L); Myofascial pain; Spondylosis without myelopathy or radiculopathy, cervical region; Cervicalgia; S/P insertion of Epidural Neurostimulator (SCS) (Bilateral); Chronic neck pain with history of cervical spinal surgery; 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[facility]); Trigger point with back pain (Right); Cervicogenic headache (Bilateral); Occipital headache (Bilateral); Chronic tension-type headache, intractable; Cervico-occipital neuralgia (Bilateral); Pain due to any device, implant or graft (SCS battery) (Left PSIS area); Spondylosis without myelopathy or radiculopathy, lumbosacral region; History of cervical spinal surgery (C5-7 ACDF); Cervical radiculopathy at C6 (Bilateral); Chronic low back pain (Bilateral) w/o sciatica; Presence of neurostimulator (Thoracolumbar); Spinal cord stimulator dysfunction (HCC); Neurogenic pain; Chronic low back pain (Left) w/o sciatica (implant pain); Cluneal neuropathy (Left); Abnormal MRI, lumbar spine (04/17/2021); Enthesopathy of hip region (Bilateral); Protrusion of lumbar intervertebral disc (Right: L1-2, L3-4); Lumbar lateral recess stenosis (Bilateral: L2-3); Lumbar foraminal stenosis (Bilateral: L4-5) (Right: L3-4); Right rib fracture; Chronic sacroiliac joint pain (Right); and Radicular pain of shoulder (Bilateral) on their pertinent problem list. Pain Assessment: Severity of Chronic pain is reported as a 7 /10. Location: Neck Right, Left/pain radiaities down her neck to her shoulder to upper arm. Onset: More than a month ago. Quality: Aching, Burning, Constant, Stabbing, Shooting, Sharp, Throbbing. Timing: Constant. Modifying factor(s): Nothing. Vitals:  height is 5\' 1"  (1.549 m) and weight is 160 lb (72.6 kg). Her temperature is 97.3 F (36.3 C)  (abnormal). Her blood pressure is 147/78 (abnormal) and her pulse is 73. Her oxygen saturation is 99%.  BMI: Estimated body mass index is 30.23 kg/m as calculated from the following:   Height as of this encounter: 5\' 1"  (1.549 m).   Weight as of this encounter: 160 lb (72.6 kg).  Reason for encounter: medication management.  The patient  indicates doing well with the current medication regimen. No adverse reactions or side effects reported to the medications.   The patient refers having had a fall approximately 1-1/2 weeks ago and although she did not hit her head or neck area, she has began to experience neck pain (Bilateral) (R>L) with referred pain to both shoulders and numbness that goes down her right arm to the tip of her index finger and thumb and what appears to be a C6 dermatomal distribution.  She has a longstanding history of cervical problems and having had a prior cervical fusion.  Imaging studies have revealed the patient to have a bilateral C5-6 cervical foraminal stenosis.  In the past she has had bilateral C6 radiculopathies and cervicalgia all associated with her cervical spine problems.  At this point, she seems to be having a flareup of her right-sided C6 radiculopathy.  For this reason we will go ahead and schedule the patient to return for a right-sided cervical epidural steroid injection under fluoroscopic guidance.  She has had this procedure done before with good results.  The last time that we did a right-sided cervical epidural steroid injection was on 08/24/2020.  The patient has requested to have the procedure done with sedation due to her anxiety.  We will be scheduling the patient to return for that right-sided cervical epidural steroid injection under fluoroscopic guidance.  The plan was shared with the patient who understood and accepted.  RTCB:  03/10/2023   Pharmacotherapy Assessment  Analgesic: Oxycodone IR 5 mg, 1 tab PO QD (5 mg/day of oxycodone) PRN MME/day: 7.5  mg/day.   Monitoring: [place] PMP: PDMP reviewed during this encounter.       Pharmacotherapy: No side-effects or adverse reactions reported. Compliance: No problems identified. Effectiveness: Clinically acceptable.  Brigitte Pulse, RN  12/04/2022  2:19 PM  Sign when Signing Visit Nursing Pain Medication Assessment:  Safety precautions to be maintained throughout the outpatient stay will include: orient to surroundings, keep bed in low position, maintain call bell within reach at all times, provide assistance with transfer out of bed and ambulation.  Medication Inspection Compliance: Pill count conducted under aseptic conditions, in front of the patient. Neither the pills nor the bottle was removed from the patient's sight at any time. Once count was completed pills were immediately returned to the patient in their original bottle.  Medication: Oxycodone IR Pill/Patch Count:  12 of 20 pills remain Pill/Patch Appearance: Markings consistent with prescribed medication Bottle Appearance: Standard pharmacy container. Clearly labeled. Filled Date: 2 / 20 / 2024 Last Medication intake:   12/01/22 Safety precautions to be maintained throughout the outpatient stay will include: orient to surroundings, keep bed in low position, maintain call bell within reach at all times, provide assistance with transfer out of bed and ambulation.     No results found for: "CBDTHCR" No results found for: "D8THCCBX" No results found for: "D9THCCBX"  UDS:  Summary  Date Value Ref Range Status  09/11/2022 Note  Final    Comment:    ==================================================================== [employer] [facility] 13 (MW) ==================================================================== Test                             Result       Flag       Units  Drug Present not Declared for Prescription Verification   Hydrocodone  395          UNEXPECTED ng/mg creat   Norhydrocodone                 583           UNEXPECTED ng/mg creat    Sources of hydrocodone include scheduled prescription medications.    Norhydrocodone is an expected metabolite of hydrocodone.  Drug Absent but Declared for Prescription Verification   Oxycodone                      Not Detected UNEXPECTED ng/mg creat ==================================================================== Test                      Result    Flag   Units      Ref Range   Creatinine              165              mg/dL      >=10 ==================================================================== Declared Medications:  The flagging and interpretation on this report are based on the  following declared medications.  Unexpected results may arise from  inaccuracies in the declared medications.   **Note: The testing scope of this panel includes these medications:   Oxycodone (OxyIR)  Oxycodone (Percocet)   **Note: The testing scope of this panel does not include the  following reported medications:   Acetaminophen (Tylenol)  Acetaminophen (Percocet)  Cyclobenzaprine (Flexeril)  Fluoxetine (Prozac)  Naloxone (Narcan)  Ondansetron (Zofran)  Scopolamine  Tizanidine (Zanaflex)  Trazodone (Desyrel) ==================================================================== For clinical consultation, please call [phone]. ====================================================================       ROS  Constitutional: Denies any fever or chills Gastrointestinal: No reported hemesis, hematochezia, vomiting, or acute GI distress Musculoskeletal: Denies any acute onset joint swelling, redness, loss of ROM, or weakness Neurological: No reported episodes of acute onset apraxia, aphasia, dysarthria, agnosia, amnesia, paralysis, loss of coordination, or loss of consciousness  Medication Review  FLUoxetine, QUEtiapine, acetaminophen, naloxone, ondansetron, oxyCODONE, oxyCODONE-acetaminophen, and scopolamine  History Review  Allergy: Karen Edwards  is allergic to gabapentin. Drug: Karen Edwards  reports no history of drug use. Alcohol:  reports current alcohol use of about 1.0 standard drink of alcohol per week. Tobacco:  reports that she has been smoking cigarettes. She has a 18.50 pack-year smoking history. She has never used smokeless tobacco. Social: Karen Edwards  reports that she has been smoking cigarettes. She has a 18.50 pack-year smoking history. She has never used smokeless tobacco. She reports current alcohol use of about 1.0 standard drink of alcohol per week. She reports that she does not use drugs. Medical:  has a past medical history of Anxiety, Chronic back pain, Degenerative joint disease, Depression, Headache, Hypotension, IBS (irritable bowel syndrome) (05/2015), MVP (mitral valve prolapse), and Nausea and vomiting. Surgical: Karen Edwards  has a past surgical history that includes Cesarean section; Ankle surgery (Right, 03/02/2014); Hemorrhoid surgery (N/A, 06/16/2015); Cervical spine surgery (02/21/2016); Cesarean section with bilateral tubal ligation (1990); Colonoscopy (04/19/2015); Hysteroscopy with D & C (12/24/2013); Colonoscopy with propofol (N/A, 05/18/2018); Esophagogastroduodenoscopy (egd) with propofol (N/A, 05/18/2018); Spinal cord stimulator insertion (N/A, 06/18/2018); Tubal ligation; and Lumbar spinal cord simulator revision (N/A, 03/14/2020). Family: family history includes Arthritis in her mother; Congestive Heart Failure in her maternal grandfather; Depression in her mother; Diabetes in her maternal aunt; Diabetes Mellitus I in her maternal grandmother; Gout in her maternal aunt; Hearing loss in her father; Heart attack in her maternal  grandfather; Hyperlipidemia in her father and mother; Hypertension in her father and mother; Hypothyroidism in her mother; Kidney disease in her mother; Prostate cancer in her paternal grandfather; Stroke in her maternal grandmother; Thyroid disease in her maternal aunt; Vision loss in her  mother.  Laboratory Chemistry Profile   Renal Lab Results  Component Value Date   BUN 12 02/01/2021   CREATININE 0.85 02/01/2021   BCR 8 (L) 09/08/2017   GFR 77.34 02/01/2021   GFRAA 93 09/08/2017   GFRNONAA 81 09/08/2017    Hepatic Lab Results  Component Value Date   AST 15 02/01/2021   ALT 11 02/01/2021   ALBUMIN 4.4 02/01/2021   ALKPHOS 59 02/01/2021    Electrolytes Lab Results  Component Value Date   NA 139 02/01/2021   K 4.2 02/01/2021   CL 105 02/01/2021   CALCIUM 9.3 02/01/2021   MG 2.1 09/08/2017    Bone Lab Results  Component Value Date   VD25OH 43.74 01/10/2016   25OHVITD1 51 09/08/2017   25OHVITD2 <1.0 09/08/2017   25OHVITD3 51 09/08/2017    Inflammation (CRP: Acute Phase) (ESR: Chronic Phase) Lab Results  Component Value Date   CRP 1.4 09/08/2017   ESRSEDRATE 11 09/08/2017         Note: Above Lab results reviewed.  Recent Imaging Review  DG PAIN CLINIC C-ARM 1-60 MIN NO REPORT Fluoro was used, but no Radiologist interpretation will be provided.  Please refer to "NOTES" tab for provider progress note. Note: Reviewed        Physical Exam  General appearance: Well nourished, well developed, and well hydrated. In no apparent acute distress Mental status: Alert, oriented x 3 (person, place, & time)       Respiratory: No evidence of acute respiratory distress Eyes: PERLA Vitals: BP (!) 147/78   Pulse 73   Temp (!) 97.3 F (36.3 C)   Ht 5\' 1"  (1.549 m)   Wt 160 lb (72.6 kg)   LMP 05/05/2017 (Exact Date)   SpO2 99%   BMI 30.23 kg/m  BMI: Estimated body mass index is 30.23 kg/m as calculated from the following:   Height as of this encounter: 5\' 1"  (1.549 m).   Weight as of this encounter: 160 lb (72.6 kg). Ideal: Ideal body weight: 47.8 kg (105 lb 6.1 oz) Adjusted ideal body weight: 57.7 kg (127 lb 3.7 oz)  Assessment   Diagnosis Status  1. Chronic pain syndrome   2. Chronic low back pain (1ry area of Pain) (Bilateral) (R>L) w/  sciatica (Bilateral)   3. Chronic lower extremity pain (2ry area of Pain) (Bilateral) (R>L)   4. Chronic hip pain (3ry area of Pain) (Bilateral) (R>L)   5. Chronic neck pain (4th area of Pain) (Bilateral) (R>L)   6. Chronic occipital neuralgia (5th area of Pain) (Bilateral) (R>L)   7. Lumbar facet syndrome (Bilateral) (L>R)   8. Pharmacologic therapy   9. Chronic use of opiate for therapeutic purpose   10. Encounter for medication management   11. Encounter for chronic pain management   12. Cervicalgia   13. Chronic shoulder pain (Bilateral) (L>R)   14. Cervical radiculopathy at C6 (Bilateral)   15. Cervical foraminal stenosis (C5-6) (Bilateral)   16. Chronic cervical radicular pain (Right: C6/C7) (Left: C5/T1)   17. Radicular pain of shoulder (Bilateral)    Controlled Controlled Controlled   Updated Problems: Problem  Radicular pain of shoulder (Bilateral)    Plan of Care  Problem-specific:  No problem-specific Assessment &  Plan notes found for this encounter.  Karen Edwards has a current medication list which includes the following long-term medication(s): fluoxetine, quetiapine, [START ON 12/10/2022] oxycodone, [START ON 01/09/2023] oxycodone, and [START ON 02/08/2023] oxycodone.  Pharmacotherapy (Medications Ordered): Meds ordered this encounter  Medications   oxyCODONE (OXY IR/ROXICODONE) 5 MG immediate release tablet    Sig: Take 1 tablet (5 mg total) by mouth 2 (two) times daily as needed for severe pain. Must last 30 days.    Dispense:  20 tablet    Refill:  0    DO NOT: delete (not duplicate); no partial-fill (will deny script to complete), no refill request (F/U required). DISPENSE: 1 day early if closed on fill date. WARN: No CNS-depressants within 8 hrs of med.   oxyCODONE (OXY IR/ROXICODONE) 5 MG immediate release tablet    Sig: Take 1 tablet (5 mg total) by mouth 2 (two) times daily as needed for severe pain. Must last 30 days.    Dispense:  20 tablet     Refill:  0    DO NOT: delete (not duplicate); no partial-fill (will deny script to complete), no refill request (F/U required). DISPENSE: 1 day early if closed on fill date. WARN: No CNS-depressants within 8 hrs of med.   oxyCODONE (OXY IR/ROXICODONE) 5 MG immediate release tablet    Sig: Take 1 tablet (5 mg total) by mouth 2 (two) times daily as needed for severe pain. Must last 30 days.    Dispense:  20 tablet    Refill:  0    DO NOT: delete (not duplicate); no partial-fill (will deny script to complete), no refill request (F/U required). DISPENSE: 1 day early if closed on fill date. WARN: No CNS-depressants within 8 hrs of med.   Orders:  Orders Placed This Encounter  Procedures   Cervical Epidural Injection    Sedation: Patient's choice. Purpose: Therapeutic Indication(s): Radiculitis and cervicalgia associater with cervical degenerative disc disease.    Standing Status:   Future    Standing Expiration Date:   03/04/2023    Scheduling Instructions:     Procedure: Cervical Epidural Steroid Injection/Block     Level(s): C7-T1     Laterality: Right-sided     Timeframe: As soon as schedule allows    Order Specific Question:   Where will this procedure be performed?    Answer:   [facility]    Comments:   by Dr. Laban Emperor   Follow-up plan:   Return for (ECT): (R) CESI #2.     Interventional Therapies  Risk Factors  Considerations:   Mitral valve prolapse (MVP) GERD  urinary stress incontinence  GAD   Planned  Pending:   Therapeutic right cervical ESI #2    Under consideration:   Therapeutic right L1-2 & L3-4 percutaneous discectomy ([employer] compressor)  Therapeutic intrathecal pump trial    Completed:   Therapeutic right cervical ESI x1 (08/24/2020) (100/100/50/50)  Diagnostic bilateral GONB x1 (08/19/2019) (100/100/100/,50)  Palliative right cervical facet MBB x3 (07/23/2018) (100/100/100/>50)  Palliative left cervical facet MBB x4 (04/01/2019) (90/90/0/0)   Palliative right cervical facet RFA x1 (09/17/2018) (100/100/85/85)  Palliative left cervical facet RFA x1 (05/04/2019) (100/100/50/50)  Therapeutic left L1-2 LESI x1 (05/10/2021) (100/100/100 x 2 days/0)  Therapeutic right L1-2 LESI x2 (02/10/2018) (100/100/70/50-75)  Therapeutic right L1 TFESI x1 (02/10/2018) (100/100/70/50-75)  Diagnostic/therapeutic left superior cluneal NB (L2, L3 dorsal rami) x1 (04/24/2021) (0/0/0/0)  Diagnostic/therapeutic left L1 TFESI x2 (11/15/2021) (100/100/0)  Diagnostic/therapeutic left L3 TFESI  x2 (11/15/2021) (100/100/0)  Bilateral lumbar spinal cord stimulator trial (done) (05/14/2018) (by me)  Bilateral lumbar spinal cord stimulator implant (09/28/2019) (by me)  Bilateral spinal cord stimulator revision (03/14/2020) (by me)  Diagnostic bilateral lumbar facet MBB x3 (11/30/2019) (100/100/50/20)  Diagnostic bilateral SI joint Blk x2 (09/18/2021) (100/100/100)  Palliative left lumbar facet RFA x3 (06/27/2022) (100/100/100/100)  Therapeutic right lumbar facet RFA x3 (05/21/2022) (100/100/75/75)    Therapeutic  Palliative (PRN) options:   Palliative right cervical facet RFA   Diagnostic bilateral SI joint Blk   Therapeutic bilateral lumbar facet RFA       Recent Visits Date Type Provider Dept  09/11/22 Office Visit Delano Metz, MD [facility]  Showing recent visits within past 90 days and meeting all other requirements Today's Visits Date Type Provider Dept  12/04/22 Office Visit Delano Metz, MD [facility]  Showing today's visits and meeting all other requirements Future Appointments Date Type Provider Dept  03/04/23 Appointment Edward Jolly, MD [facility]  Showing future appointments within next 90 days and meeting all other requirements  I discussed the assessment and treatment plan with the patient. The patient was provided an opportunity to ask questions and all were answered. The patient agreed with the plan  and demonstrated an understanding of the instructions.  Patient advised to call back or seek an in-person evaluation if the symptoms or condition worsens.  Duration of encounter: 30 minutes.  Total time on encounter, as per AMA guidelines included both the face-to-face and non-face-to-face time personally spent by the physician and/or other qualified health care professional(s) on the day of the encounter (includes time in activities that require the physician or other qualified health care professional and does not include time in activities normally performed by clinical staff). Physician's time may include the following activities when performed: Preparing to see the patient (e.g., pre-charting review of records, searching for previously ordered imaging, lab work, and nerve conduction tests) Review of prior analgesic pharmacotherapies. Reviewing PMP Interpreting ordered tests (e.g., lab work, imaging, nerve conduction tests) Performing post-procedure evaluations, including interpretation of diagnostic procedures Obtaining and/or reviewing separately obtained history Performing a medically appropriate examination and/or evaluation Counseling and educating the patient/family/caregiver Ordering medications, tests, or procedures Referring and communicating with other health care professionals (when not separately reported) Documenting clinical information in the electronic or other health record Independently interpreting results (not separately reported) and communicating results to the patient/ family/caregiver Care coordination (not separately reported)  Note by: Oswaldo Done, MD Date: 12/04/2022; Time: 3:58 PM

## 2022-12-04 ENCOUNTER — Ambulatory Visit: Payer: Medicare PPO | Attending: Pain Medicine | Admitting: Pain Medicine

## 2022-12-04 ENCOUNTER — Encounter: Payer: Self-pay | Admitting: Pain Medicine

## 2022-12-04 VITALS — BP 147/78 | HR 73 | Temp 97.3°F | Ht 61.0 in | Wt 160.0 lb

## 2022-12-04 DIAGNOSIS — M25512 Pain in left shoulder: Secondary | ICD-10-CM | POA: Insufficient documentation

## 2022-12-04 DIAGNOSIS — M5441 Lumbago with sciatica, right side: Secondary | ICD-10-CM | POA: Diagnosis not present

## 2022-12-04 DIAGNOSIS — M79604 Pain in right leg: Secondary | ICD-10-CM | POA: Insufficient documentation

## 2022-12-04 DIAGNOSIS — Z79899 Other long term (current) drug therapy: Secondary | ICD-10-CM | POA: Insufficient documentation

## 2022-12-04 DIAGNOSIS — M5442 Lumbago with sciatica, left side: Secondary | ICD-10-CM | POA: Insufficient documentation

## 2022-12-04 DIAGNOSIS — Z79891 Long term (current) use of opiate analgesic: Secondary | ICD-10-CM | POA: Insufficient documentation

## 2022-12-04 DIAGNOSIS — M25551 Pain in right hip: Secondary | ICD-10-CM | POA: Insufficient documentation

## 2022-12-04 DIAGNOSIS — M47816 Spondylosis without myelopathy or radiculopathy, lumbar region: Secondary | ICD-10-CM | POA: Diagnosis present

## 2022-12-04 DIAGNOSIS — M25511 Pain in right shoulder: Secondary | ICD-10-CM | POA: Insufficient documentation

## 2022-12-04 DIAGNOSIS — G8929 Other chronic pain: Secondary | ICD-10-CM | POA: Diagnosis present

## 2022-12-04 DIAGNOSIS — M542 Cervicalgia: Secondary | ICD-10-CM | POA: Insufficient documentation

## 2022-12-04 DIAGNOSIS — M5481 Occipital neuralgia: Secondary | ICD-10-CM

## 2022-12-04 DIAGNOSIS — M25552 Pain in left hip: Secondary | ICD-10-CM | POA: Diagnosis present

## 2022-12-04 DIAGNOSIS — M79605 Pain in left leg: Secondary | ICD-10-CM | POA: Diagnosis present

## 2022-12-04 DIAGNOSIS — G894 Chronic pain syndrome: Secondary | ICD-10-CM | POA: Diagnosis not present

## 2022-12-04 DIAGNOSIS — M5412 Radiculopathy, cervical region: Secondary | ICD-10-CM | POA: Insufficient documentation

## 2022-12-04 DIAGNOSIS — M4802 Spinal stenosis, cervical region: Secondary | ICD-10-CM | POA: Diagnosis present

## 2022-12-04 MED ORDER — OXYCODONE HCL 5 MG PO TABS: 5.0000 mg | ORAL_TABLET | Freq: Two times a day (BID) | ORAL | 0 refills | Status: DC | PRN

## 2022-12-04 NOTE — Patient Instructions (Addendum)
______________________________________________________________________  Procedure instructions  Do not eat or drink fluids (other than water) for 6 hours before your procedure  No water for 2 hours before your procedure  Take your blood pressure medicine with a sip of water  Arrive 30 minutes before your appointment  Carefully read the "Preparing for your procedure" detailed instructions  If you have questions call us at [phone]  _____________________________________________________________________    ______________________________________________________________________  Preparing for your procedure  During your procedure appointment there will be: No Prescription Refills. No disability issues to discussed. No medication changes or discussions.  Instructions: Food intake: Avoid eating anything solid for at least 8 hours prior to your procedure. Clear liquid intake: You may take clear liquids such as water up to 2 hours prior to your procedure. (No carbonated drinks. No soda.) Transportation: Unless otherwise stated by your physician, bring a driver. Morning Medicines: Except for blood thinners, take all of your other morning medications with a sip of water. Make sure to take your heart and blood pressure medicines. If your blood pressure's lower number is above 100, the case will be rescheduled. Blood thinners: Make sure to stop your blood thinners as instructed.  If you take a blood thinner, but were not instructed to stop it, call our office [phone] and ask to talk to a nurse. Not stopping a blood thinner prior to certain procedures could lead to serious complications. Diabetics on insulin: Notify the staff so that you can be scheduled 1st case in the morning. If your diabetes requires high dose insulin, take only  of your normal insulin dose the morning of the procedure and notify the staff that you have done so. Preventing infections: Shower with an  antibacterial soap the morning of your procedure.  Build-up your immune system: Take 1000 mg of Vitamin C with every meal (3 times a day) the day prior to your procedure. Antibiotics: Inform the nursing staff if you are taking any antibiotics or if you have any conditions that may require antibiotics prior to procedures. (Example: recent joint implants)   Pregnancy: If you are pregnant make sure to notify the nursing staff. Not doing so may result in injury to the fetus, including death.  Sickness: If you have a cold, fever, or any active infections, call and cancel or reschedule your procedure. Receiving steroids while having an infection may result in complications. Arrival: You must be in the facility at least 30 minutes prior to your scheduled procedure. Tardiness: Your scheduled time is also the cutoff time. If you do not arrive at least 15 minutes prior to your procedure, you will be rescheduled.  Children: Do not bring any children with you. Make arrangements to keep them home. Dress appropriately: There is always a possibility that your clothing may get soiled. Avoid long dresses. Valuables: Do not bring any jewelry or valuables.  Reasons to call and reschedule or cancel your procedure: (Following these recommendations will minimize the risk of a serious complication.) Surgeries: Avoid having procedures within 2 weeks of any surgery. (Avoid for 2 weeks before or after any surgery). Flu Shots: Avoid having procedures within 2 weeks of a flu shots or . (Avoid for 2 weeks before or after immunizations). Barium: Avoid having a procedure within 7-10 days after having had a radiological study involving the use of radiological contrast. (Myelograms, Barium swallow or enema study). Heart attacks: Avoid any elective procedures or surgeries for the initial 6 months after a "Myocardial Infarction" (Heart Attack). Blood thinners: It  is imperative that you stop these medications before procedures. Let us  know if you if you take any blood thinner.  Infection: Avoid procedures during or within two weeks of an infection (including chest colds or gastrointestinal problems). Symptoms associated with infections include: Localized redness, fever, chills, night sweats or profuse sweating, burning sensation when voiding, cough, congestion, stuffiness, runny nose, sore throat, diarrhea, nausea, vomiting, cold or Flu symptoms, recent or current infections. It is specially important if the infection is over the area that we intend to treat. Heart and lung problems: Symptoms that may suggest an active cardiopulmonary problem include: cough, chest pain, breathing difficulties or shortness of breath, dizziness, ankle swelling, uncontrolled high or unusually low blood pressure, and/or palpitations. If you are experiencing any of these symptoms, cancel your procedure and contact your primary care physician for an evaluation.  Remember:  Regular Business hours are:  Monday to Thursday 8:00 AM to 4:00 PM  Provider's Schedule: Milinda Pointer, MD:  Procedure days: Tuesday and Thursday 7:30 AM to 4:00 PM  Gillis Santa, MD:  Procedure days: Monday and Wednesday 7:30 AM to 4:00 PM  ______________________________________________________________________    ____________________________________________________________________________________________  General Risks and Possible Complications  Patient Responsibilities: It is important that you read this as it is part of your informed consent. It is our duty to inform you of the risks and possible complications associated with treatments offered to you. It is your responsibility as a patient to read this and to ask questions about anything that is not clear or that you believe was not covered in this document.  Patient's Rights: You have the right to refuse treatment. You also have the right to change your mind, even after initially having agreed to have the treatment  done. However, under this last option, if you wait until the last second to change your mind, you may be charged for the materials used up to that point.  Introduction: Medicine is not an Chief Strategy Officer. Everything in Medicine, including the lack of treatment(s), carries the potential for danger, harm, or loss (which is by definition: Risk). In Medicine, a complication is a secondary problem, condition, or disease that can aggravate an already existing one. All treatments carry the risk of possible complications. The fact that a side effects or complications occurs, does not imply that the treatment was conducted incorrectly. It must be clearly understood that these can happen even when everything is done following the highest safety standards.  No treatment: You can choose not to proceed with the proposed treatment alternative. The "PRO(s)" would include: avoiding the risk of complications associated with the therapy. The "CON(s)" would include: not getting any of the treatment benefits. These benefits fall under one of three categories: diagnostic; therapeutic; and/or palliative. Diagnostic benefits include: getting information which can ultimately lead to improvement of the disease or symptom(s). Therapeutic benefits are those associated with the successful treatment of the disease. Finally, palliative benefits are those related to the decrease of the primary symptoms, without necessarily curing the condition (example: decreasing the pain from a flare-up of a chronic condition, such as incurable terminal cancer).  General Risks and Complications: These are associated to most interventional treatments. They can occur alone, or in combination. They fall under one of the following six (6) categories: no benefit or worsening of symptoms; bleeding; infection; nerve damage; allergic reactions; and/or death. No benefits or worsening of symptoms: In Medicine there are no guarantees, only probabilities. No  healthcare provider can ever guarantee that a  medical treatment will work, they can only state the probability that it may. Furthermore, there is always the possibility that the condition may worsen, either directly, or indirectly, as a consequence of the treatment. Bleeding: This is more common if the patient is taking a blood thinner, either prescription or over the counter (example: Goody Powders, Fish oil, Aspirin, Garlic, etc.), or if suffering a condition associated with impaired coagulation (example: Hemophilia, cirrhosis of the liver, low platelet counts, etc.). However, even if you do not have one on these, it can still happen. If you have any of these conditions, or take one of these drugs, make sure to notify your treating physician. Infection: This is more common in patients with a compromised immune system, either due to disease (example: diabetes, cancer, human immunodeficiency virus [HIV], etc.), or due to medications or treatments (example: therapies used to treat cancer and rheumatological diseases). However, even if you do not have one on these, it can still happen. If you have any of these conditions, or take one of these drugs, make sure to notify your treating physician. Nerve Damage: This is more common when the treatment is an invasive one, but it can also happen with the use of medications, such as those used in the treatment of cancer. The damage can occur to small secondary nerves, or to large primary ones, such as those in the spinal cord and brain. This damage may be temporary or permanent and it may lead to impairments that can range from temporary numbness to permanent paralysis and/or brain death. Allergic Reactions: Any time a substance or material comes in contact with our body, there is the possibility of an allergic reaction. These can range from a mild skin rash (contact dermatitis) to a severe systemic reaction (anaphylactic reaction), which can result in death. Death: In  general, any medical intervention can result in death, most of the time due to an unforeseen complication. ____________________________________________________________________________________________    ____________________________________________________________________________________________  Patient Information update  To: All of our patients.  Re: Name change.  It has been made official that our current name, " [facility]"   will soon be changed to "[facility]".   The purpose of this change is to eliminate any confusion created by the concept of our practice being a "Medication Management Pain Clinic". In the past this has led to the misconception that we treat pain primarily by the use of prescription medications.  Nothing can be farther from the truth.   Understanding PAIN MANAGEMENT: To further understand what our practice does, you first have to understand that "Pain Management" is a subspecialty that requires additional training once a physician has completed their specialty training, which can be in either Anesthesia, Neurology, Psychiatry, or Physical Medicine and Rehabilitation (PMR). Each one of these contributes to the final approach taken by each physician to the management of their patient's pain. To be a "Pain Management Specialist" you must have first completed one of the specialty trainings below.  Anesthesiologists - trained in clinical pharmacology and interventional techniques such as nerve blockade and regional as well as central neuroanatomy. They are trained to block pain before, during, and after surgical interventions.  Neurologists - trained in the diagnosis and pharmacological treatment of complex neurological conditions, such as Multiple Sclerosis, Parkinson's, spinal cord injuries, and other systemic conditions that may be associated with symptoms that may  include but are not limited to pain. They tend to rely   primarily on the treatment of chronic pain using prescription medications.  Psychiatrist - trained in conditions affecting the psychosocial wellbeing of patients including but not limited to depression, anxiety, schizophrenia, personality disorders, addiction, and other substance use disorders that may be associated with chronic pain. They tend to rely primarily on the treatment of chronic pain using prescription medications.   Physical Medicine and Rehabilitation (PMR) physicians, also known as physiatrists - trained to treat a wide variety of medical conditions affecting the brain, spinal cord, nerves, bones, joints, ligaments, muscles, and tendons. Their training is primarily aimed at treating patients that have suffered injuries that have caused severe physical impairment. Their training is primarily aimed at the physical therapy and rehabilitation of those patients. They may also work alongside orthopedic surgeons or neurosurgeons using their expertise in assisting surgical patients to recover after their surgeries.  INTERVENTIONAL PAIN MANAGEMENT is sub-subspecialty of Pain Management.  Our physicians are Board-certified in Anesthesia, Pain Management, and Interventional Pain Management.  This meaning that not only have they been trained and Board-certified in their specialty of Anesthesia, and subspecialty of Pain Management, but they have also received further training in the sub-subspecialty of Interventional Pain Management, in order to become Board-certified as INTERVENTIONAL PAIN MANAGEMENT SPECIALIST.    Mission: Our goal is to use our skills in  INTERVENTIONAL PAIN MANAGEMENT as alternatives to the chronic use of prescription opioid medications for the treatment of pain. To make this more clear, we have changed our name to reflect what we do and offer. We will continue to offer medication management assessment and recommendations, but we  will not be taking over any patient's medication management.  ____________________________________________________________________________________________     ____________________________________________________________________________________________  Opioid Pain Medication Update  To: All patients taking opioid pain medications. (I.e.: hydrocodone, hydromorphone, oxycodone, oxymorphone, morphine, codeine, methadone, tapentadol, tramadol, buprenorphine, fentanyl, etc.)  Re: Updated review of side effects and adverse reactions of opioid analgesics, as well as new information about long term effects of this class of medications.  Direct risks of long-term opioid therapy are not limited to opioid addiction and overdose. Potential medical risks include serious fractures, breathing problems during sleep, hyperalgesia, immunosuppression, chronic constipation, bowel obstruction, myocardial infarction, and tooth decay secondary to xerostomia.  Unpredictable adverse effects that can occur even if you take your medication correctly: Cognitive impairment, respiratory depression, and death. Most people think that if they take their medication "correctly", and "as instructed", that they will be safe. Nothing could be farther from the truth. In reality, a significant amount of recorded deaths associated with the use of opioids has occurred in individuals that had taken the medication for a long time, and were taking their medication correctly. The following are examples of how this can happen: Patient taking his/her medication for a long time, as instructed, without any side effects, is given a certain antibiotic or another unrelated medication, which in turn triggers a "Drug-to-drug interaction" leading to disorientation, cognitive impairment, impaired reflexes, respiratory depression or an untoward event leading to serious bodily harm or injury, including death.  Patient taking his/her medication for a long  time, as instructed, without any side effects, develops an acute impairment of liver and/or kidney function. This will lead to a rapid inability of the body to breakdown and eliminate their pain medication, which will result in effects similar to an "overdose", but with the same medicine and dose that they had always taken. This again may lead to disorientation, cognitive impairment, impaired reflexes, respiratory depression or an untoward   event leading to serious bodily harm or injury, including death.  A similar problem will occur with patients as they grow older and their liver and kidney function begins to decrease as part of the aging process.  Background information: Historically, the original case for using long-term opioid therapy to treat chronic noncancer pain was based on safety assumptions that subsequent experience has called into question. In 1996, the American Pain Society and the American Academy of Pain Medicine issued a consensus statement supporting long-term opioid therapy. This statement acknowledged the dangers of opioid prescribing but concluded that the risk for addiction was low; respiratory depression induced by opioids was short-lived, occurred mainly in opioid-naive patients, and was antagonized by pain; tolerance was not a common problem; and efforts to control diversion should not constrain opioid prescribing. This has now proven to be wrong. Experience regarding the risks for opioid addiction, misuse, and overdose in community practice has failed to support these assumptions.  According to the Centers for Disease Control and Prevention, fatal overdoses involving opioid analgesics have increased sharply over the past decade. Currently, more than 96,700 people die from drug overdoses every year. Opioids are a factor in 7 out of every 10 overdose deaths. Deaths from drug overdose have surpassed motor vehicle accidents as the leading cause of death for individuals between the ages of  35 and 54.  Clinical data suggest that neuroendocrine dysfunction may be very common in both men and women, potentially causing hypogonadism, erectile dysfunction, infertility, decreased libido, osteoporosis, and depression. Recent studies linked higher opioid dose to increased opioid-related mortality. Controlled observational studies reported that long-term opioid therapy may be associated with increased risk for cardiovascular events. Subsequent meta-analysis concluded that the safety of long-term opioid therapy in elderly patients has not been proven.   Side Effects and adverse reactions: Common side effects: Drowsiness (sedation). Dizziness. Nausea and vomiting. Constipation. Physical dependence -- Dependence often manifests with withdrawal symptoms when opioids are discontinued or decreased. Tolerance -- As you take repeated doses of opioids, you require increased medication to experience the same effect of pain relief. Respiratory depression -- This can occur in healthy people, especially with higher doses. However, people with COPD, asthma or other lung conditions may be even more susceptible to fatal respiratory impairment.  Uncommon side effects: An increased sensitivity to feeling pain and extreme response to pain (hyperalgesia). Chronic use of opioids can lead to this. Delayed gastric emptying (the process by which the contents of your stomach are moved into your small intestine). Muscle rigidity. Immune system and hormonal dysfunction. Quick, involuntary muscle jerks (myoclonus). Arrhythmia. Itchy skin (pruritus). Dry mouth (xerostomia).  Long-term side effects: Chronic constipation. Sleep-disordered breathing (SDB). Increased risk of bone fractures. Hypothalamic-pituitary-adrenal dysregulation. Increased risk of overdose.  RISKS: Fractures and Falls:  Opioids increase the risk and incidence of falls. This is of particular importance in elderly patients.  Endocrine  System:  Long-term administration is associated with endocrine abnormalities. Influences on both the hypothalamic-pituitary-adrenal axis?and the hypothalamic-pituitary-gonadal axis have been demonstrated with consequent hypogonadism and adrenal insufficiency in both sexes. Hypogonadism and decreased levels of dehydroepiandrosterone sulfate have been reported in men and women. Endocrine effects can lead to: Amenorrhoea in women Reduced libido in both sexes Erectile dysfunction in men Infertility Depression and fatigue Patients (particularly women of childbearing age) should avoid opioids. There is insufficient evidence to recommend routine monitoring of asymptomatic patients taking opioids in the long-term for hormonal deficiencies.  Immune System: Human studies have demonstrated that opioids have an immunomodulating effect.   These effects are mediated via opioid receptors both on immune effector cells and in the central nervous system. Opioids have been demonstrated to have adverse effects on antimicrobial response and anti-tumour surveillance. Buprenorphine has been demonstrated to have no impact on immune function.  Opioid Induced Hyperalgesia: Human studies have demonstrated that prolonged use of opioids can lead to a state of abnormal pain sensitivity, sometimes called opioid induced hyperalgesia (OIH). Opioid induced hyperalgesia is not usually seen in the absence of tolerance to opioid analgesia. Clinically, hyperalgesia may be diagnosed if the patient on long-term opioid therapy presents with increased pain. This might be qualitatively and anatomically distinct from pain related to disease progression or to breakthrough pain resulting from development of opioid tolerance. Pain associated with hyperalgesia tends to be more diffuse than the pre-existing pain and less defined in quality. Management of opioid induced hyperalgesia requires opioid dose reduction.  Cancer: Chronic opioid  therapy has been associated with an increased risk of cancer among noncancer patients with chronic pain. This association was more evident in chronic strong opioid users. Chronic opioid consumption causes significant pathological changes in the small intestine and colon. Epidemiological studies have found that there is a link between opium dependence and initiation of gastrointestinal cancers. Cancer is the second leading cause of death after cardiovascular disease. Chronic use of opioids can cause multiple conditions such as GERD, immunosuppression and renal damage as well as carcinogenic effects, which are associated with the incidence of cancers.   Mortality: Long-term opioid use has been associated with increased mortality among patients with chronic non-cancer pain (CNCP).  Prescription of long-acting opioids for chronic noncancer pain was associated with a significantly increased risk of all-cause mortality, including deaths from causes other than overdose.  Reference: Von Korff M, Kolodny A, Deyo RA, Chou R. Long-term opioid therapy reconsidered. Ann Intern Med. 2011 Sep 6;155(5):325-8. doi: 10.7326/[id number]. PMID: 21893626; PMCID: PMC3280085. Bedson J, Chen Y, Ashworth J, Hayward RA, Dunn KM, Jordan KP. Risk of adverse events in patients prescribed long-term opioids: A cohort study in the [place] Clinical Practice Research [employer]. Eur J Pain. 2019 May;23(5):908-922. doi: 10.1002/ejp.1357. Epub 2019 Jan 31. PMID: 30620116. Colameco S, Coren JS, Ciervo CA. Continuous opioid treatment for chronic noncancer pain: a time for moderation in prescribing. Postgrad Med. 2009 Jul;121(4):61-6. doi: 10.3810/pgm.2009.07.2032. PMID: 19641271. Chou R, Turner JA, Devine EB, Hansen RN, Sullivan SD, Blazina I, Dana T, Bougatsos C, Deyo RA. The effectiveness and risks of long-term opioid therapy for chronic pain: a systematic review for a National Institutes of Health Pathways to Prevention  Workshop. Ann Intern Med. 2015 Feb 17;162(4):276-86. doi: 10.7326/M14-2559. PMID: 25581257. Warner M, Chen LH, Makuc DM. NCHS Data Brief No. 22. [place]: Centers for Disease Control and Prevention; 2009. Sep, Increase in Fatal Poisonings Involving Opioid Analgesics in the [place], 1999-2006. Song IA, Choi HR, Oh TK. Long-term opioid use and mortality in patients with chronic non-cancer pain: Ten-year follow-up study in [place] from 2010 through 2019. EClinicalMedicine. 2022 Jul 18;51:101558. doi: 10.1016/j.eclinm.2022.101558. PMID: 35875817; PMCID: PMC9304910. Huser, W., Schubert, T., Vogelmann, T. et al. All-cause mortality in patients with long-term opioid therapy compared with non-opioid analgesics for chronic non-cancer pain: a database study. BMC Med 18, 162 (2020). https://doi.org/[email or web address] Rashidian H, Zendehdel K, Kamangar F, Malekzadeh R, Haghdoost AA. An Ecological Study of the Association between Opiate Use and Incidence of Cancers. Addict [employer]. 2016 Fall;8(4):252-260. PMID: [id number]; PMCID: [id number].  Our Goal: Our goal is to control your pain with means other than the use of opioid pain   medications.  Our Recommendation: Talk to your physician about coming off of these medications. We can assist you with the tapering down and stopping these medicines. Based on the new information, even if you cannot completely stop the medication, a decrease in the dose may be associated with a lesser risk. Ask for other means of controlling the pain. Decrease or eliminate those factors that significantly contribute to your pain such as smoking, obesity, and a diet heavily tilted towards "inflammatory" nutrients.  ____________________________________________________________________________________________     ____________________________________________________________________________________________  National Pain Medication Shortage  The [place] is experiencing  worsening drug shortages. These have had a negative widespread effect on patient care and treatment. Not expected to improve any time soon. Predicted to last past 2029.   Drug shortage list (generic names) Oxycodone IR Oxycodone/APAP Oxymorphone IR Hydromorphone Hydrocodone/APAP Morphine  Where is the problem?  Manufacturing and supply level.  Will this shortage affect you?  Only if you take any of the above pain medications.  How? You may be unable to fill your prescription.  Your pharmacist may offer a "partial fill" of your prescription. (Warning: Do not accept partial fills.) Prescriptions partially filled cannot be transferred to another pharmacy. Read our Medication Rules and Regulation. Depending on how much medicine you are dependent on, you may experience withdrawals when unable to get the medication.  Recommendations: Consider ending your dependence on opioid pain medications. Ask your pain specialist to assist you with the process. Consider switching to a medication currently not in shortage, such as Buprenorphine. Talk to your pain specialist about this option. Consider decreasing your pain medication requirements by managing tolerance thru "Drug Holidays". This may help minimize withdrawals, should you run out of medicine. Control your pain thru the use of non-pharmacological interventional therapies.   Your prescriber: Prescribers cannot be blamed for shortages. Medication manufacturing and supply issues cannot be fixed by the prescriber.   NOTE: The prescriber is not responsible for supplying the medication, or solving supply issues. Work with your pharmacist to solve it. The patient is responsible for the decision to take or continue taking the medication and for identifying and securing a legal supply source. By law, supplying the medication is the job and responsibility of the pharmacy. The prescriber is responsible for the evaluation, monitoring, and prescribing of  these medications.   Prescribers will NOT: Re-issue prescriptions that have been partially filled. Re-issue prescriptions already sent to a pharmacy.  Re-send prescriptions to a different pharmacy because yours did not have your medication. Ask pharmacist to order more medicine or transfer the prescription to another pharmacy. (Read below.)  New 2023 regulation: "June 28, 2022 Revised Regulation Allows DEA-Registered Pharmacies to Transfer Electronic Prescriptions at a Patient's Request DEA Headquarters Division - Public Information Office Patients now have the ability to request their electronic prescription be transferred to another pharmacy without having to go back to their practitioner to initiate the request. This revised regulation went into effect on Monday, June 24, 2022.     At a patient's request, a DEA-registered retail pharmacy can now transfer an electronic prescription for a controlled substance (schedules II-V) to another DEA-registered retail pharmacy. Prior to this change, patients would have to go through their practitioner to cancel their prescription and have it re-issued to a different pharmacy. The process was taxing and time consuming for both patients and practitioners.    The Drug Enforcement Administration (DEA) published its intent to revise the process for transferring electronic prescriptions on September 15, 2020.  The   final rule was published in the federal register on May 23, 2022 and went into effect 30 days later.  Under the final rule, a prescription can only be transferred once between pharmacies, and only if allowed under existing state or other applicable law. The prescription must remain in its electronic form; may not be altered in any way; and the transfer must be communicated directly between two licensed pharmacists. It's important to note, any authorized refills transfer with the original prescription, which means the entire prescription will be  filled at the same pharmacy".  Reference: https://www.dea.gov/stories/2023/2023-06/2022-09-01/revised-regulation-allows-dea-registered-pharmacies-transfer (DEA website announcement)  https://www.govinfo.gov/content/pkg/FR-2022-05-23/pdf/2023-15847.pdf (Federal Register  Department of Justice)   Federal Register / Vol. 88, No. 143 / Thursday, May 23, 2022 / Rules and Regulations DEPARTMENT OF JUSTICE  Drug Enforcement Administration  21 CFR Part 1306  [Docket No. [id number]]  RIN 1117-AB64 Transfer of Electronic Prescriptions for Schedules II-V Controlled Substances Between Pharmacies for Initial Filling  ____________________________________________________________________________________________     _______________________________________________________________________  Medication Rules  Purpose: To inform patients, and their family members, of our medication rules and regulations.  Applies to: All patients receiving prescriptions from our practice (written or electronic).  Pharmacy of record: This is the pharmacy where your electronic prescriptions will be sent. Make sure we have the correct one.  Electronic prescriptions: In compliance with the  Strengthen Opioid Misuse Prevention (STOP) Act of 2017 (Session Law 2017-74/H243), effective October 28, 2018, all controlled substances must be electronically prescribed. Written prescriptions, faxing, or calling prescriptions to a pharmacy will no longer be done.  Prescription refills: These will be provided only during in-person appointments. No medications will be renewed without a "face-to-face" evaluation with your provider. Applies to all prescriptions.  NOTE: The following applies primarily to controlled substances (Opioid* Pain Medications).   Type of encounter (visit): For patients receiving controlled substances, face-to-face visits are required. (Not an option and not up to the patient.)  Patient's  responsibilities: Pain Pills: Bring all pain pills to every appointment (except for procedure appointments). Pill Bottles: Bring pills in original pharmacy bottle. Bring bottle, even if empty. Always bring the bottle of the most recent fill.  Medication refills: You are responsible for knowing and keeping track of what medications you are taking and when is it that you will need a refill. The day before your appointment: write a list of all prescriptions that need to be refilled. The day of the appointment: give the list to the admitting nurse. Prescriptions will be written only during appointments. No prescriptions will be written on procedure days. If you forget a medication: it will not be "Called in", "Faxed", or "electronically sent". You will need to get another appointment to get these prescribed. No early refills. Do not call asking to have your prescription filled early. Partial  or short prescriptions: Occasionally your pharmacy may not have enough pills to fill your prescription.  NEVER ACCEPT a partial fill or a prescription that is short of the total amount of pills that you were prescribed.  With controlled substances the law allows 72 hours for the pharmacy to complete the prescription.  If the prescription is not completed within 72 hours, the pharmacist will require a new prescription to be written. This means that you will be short on your medicine and we WILL NOT send another prescription to complete your original prescription.  Instead, request the pharmacy to send a carrier to a nearby branch to get enough medication to provide you with your full prescription. Prescription Accuracy: You   are responsible for carefully inspecting your prescriptions before leaving our office. Have the discharge nurse carefully go over each prescription with you, before taking them home. Make sure that your name is accurately spelled, that your address is correct. Check the name and dose of your medication  to make sure it is accurate. Check the number of pills, and the written instructions to make sure they are clear and accurate. Make sure that you are given enough medication to last until your next medication refill appointment. Taking Medication: Take medication as prescribed. When it comes to controlled substances, taking less pills or less frequently than prescribed is permitted and encouraged. Never take more pills than instructed. Never take the medication more frequently than prescribed.  Inform other Doctors: Always inform, all of your healthcare providers, of all the medications you take. Pain Medication from other Providers: You are not allowed to accept any additional pain medication from any other Doctor or Healthcare provider. There are two exceptions to this rule. (see below) In the event that you require additional pain medication, you are responsible for notifying us, as stated below. Cough Medicine: Often these contain an opioid, such as codeine or hydrocodone. Never accept or take cough medicine containing these opioids if you are already taking an opioid* medication. The combination may cause respiratory failure and death. Medication Agreement: You are responsible for carefully reading and following our Medication Agreement. This must be signed before receiving any prescriptions from our practice. Safely store a copy of your signed Agreement. Violations to the Agreement will result in no further prescriptions. (Additional copies of our Medication Agreement are available upon request.) Laws, Rules, & Regulations: All patients are expected to follow all Federal and State Laws, Statutes, Rules, & Regulations. Ignorance of the Laws does not constitute a valid excuse.  Illegal drugs and Controlled Substances: The use of illegal substances (including, but not limited to marijuana and its derivatives) and/or the illegal use of any controlled substances is strictly prohibited. Violation of this  rule may result in the immediate and permanent discontinuation of any and all prescriptions being written by our practice. The use of any illegal substances is prohibited. Adopted CDC guidelines & recommendations: Target dosing levels will be at or below 60 MME/day. Use of benzodiazepines** is not recommended.  Exceptions: There are only two exceptions to the rule of not receiving pain medications from other Healthcare Providers. Exception #1 (Emergencies): In the event of an emergency (i.e.: accident requiring emergency care), you are allowed to receive additional pain medication. However, you are responsible for: As soon as you are able, call our office [phone], at any time of the day or night, and leave a message stating your name, the date and nature of the emergency, and the name and dose of the medication prescribed. In the event that your call is answered by a member of our staff, make sure to document and save the date, time, and the name of the person that took your information.  Exception #2 (Planned Surgery): In the event that you are scheduled by another doctor or dentist to have any type of surgery or procedure, you are allowed (for a period no longer than 30 days), to receive additional pain medication, for the acute post-op pain. However, in this case, you are responsible for picking up a copy of our "Post-op Pain Management for Surgeons" handout, and giving it to your surgeon or dentist. This document is available at our office, and does not require an appointment   to obtain it. Simply go to our office during business hours (Monday-Thursday from 8:00 AM to 4:00 PM) (Friday 8:00 AM to 12:00 Noon) or if you have a scheduled appointment with us, prior to your surgery, and ask for it by name. In addition, you are responsible for: calling our office [phone], at any time of the day or night, and leaving a message stating your name, name of your surgeon, type of surgery, and date of  procedure or surgery. Failure to comply with your responsibilities may result in termination of therapy involving the controlled substances. Medication Agreement Violation. Following the above rules, including your responsibilities will help you in avoiding a Medication Agreement Violation ("Breaking your Pain Medication Contract").  Consequences:  Not following the above rules may result in permanent discontinuation of medication prescription therapy.  *Opioid medications include: morphine, codeine, oxycodone, oxymorphone, hydrocodone, hydromorphone, meperidine, tramadol, tapentadol, buprenorphine, fentanyl, methadone. **Benzodiazepine medications include: diazepam (Valium), alprazolam (Xanax), clonazepam (Klonopine), lorazepam (Ativan), clorazepate (Tranxene), chlordiazepoxide (Librium), estazolam (Prosom), oxazepam (Serax), temazepam (Restoril), triazolam (Halcion) (Last updated: 08/20/2022) ______________________________________________________________________    ______________________________________________________________________  Medication Recommendations and Reminders  Applies to: All patients receiving prescriptions (written and/or electronic).  Medication Rules & Regulations: You are responsible for reading, knowing, and following our "Medication Rules" document. These exist for your safety and that of others. They are not flexible and neither are we. Dismissing or ignoring them is an act of "non-compliance" that may result in complete and irreversible termination of such medication therapy. For safety reasons, "non-compliance" will not be tolerated. As with the [place] fundamental legal principle of "ignorance of the law is no defense", we will accept no excuses for not having read and knowing the content of documents provided to you by our practice.  Pharmacy of record:  Definition: This is the pharmacy where your electronic prescriptions will be sent.  We do not endorse any  particular pharmacy. It is up to you and your insurance to decide what pharmacy to use.  We do not restrict you in your choice of pharmacy. However, once we write for your prescriptions, we will NOT be re-sending more prescriptions to fix restricted supply problems created by your pharmacy, or your insurance.  The pharmacy listed in the electronic medical record should be the one where you want electronic prescriptions to be sent. If you choose to change pharmacy, simply notify our nursing staff. Changes will be made only during your regular appointments and not over the phone.  Recommendations: Keep all of your pain medications in a safe place, under lock and key, even if you live alone. We will NOT replace lost, stolen, or damaged medication. We do not accept "Police Reports" as proof of medications having been stolen. After you fill your prescription, take 1 week's worth of pills and put them away in a safe place. You should keep a separate, properly labeled bottle for this purpose. The remainder should be kept in the original bottle. Use this as your primary supply, until it runs out. Once it's gone, then you know that you have 1 week's worth of medicine, and it is time to come in for a prescription refill. If you do this correctly, it is unlikely that you will ever run out of medicine. To make sure that the above recommendation works, it is very important that you make sure your medication refill appointments are scheduled at least 1 week before you run out of medicine. To do this in an effective manner, make sure that you   do not leave the office without scheduling your next medication management appointment. Always ask the nursing staff to show you in your prescription , when your medication will be running out. Then arrange for the receptionist to get you a return appointment, at least 7 days before you run out of medicine. Do not wait until you have 1 or 2 pills left, to come in. This is very poor  planning and does not take into consideration that we may need to cancel appointments due to bad weather, sickness, or emergencies affecting our staff. DO NOT ACCEPT A "Partial Fill": If for any reason your pharmacy does not have enough pills/tablets to completely fill or refill your prescription, do not allow for a "partial fill". The law allows the pharmacy to complete that prescription within 72 hours, without requiring a new prescription. If they do not fill the rest of your prescription within those 72 hours, you will need a separate prescription to fill the remaining amount, which we will NOT provide. If the reason for the partial fill is your insurance, you will need to talk to the pharmacist about payment alternatives for the remaining tablets, but again, DO NOT ACCEPT A PARTIAL FILL, unless you can trust your pharmacist to obtain the remainder of the pills within 72 hours.  Prescription refills and/or changes in medication(s):  Prescription refills, and/or changes in dose or medication, will be conducted only during scheduled medication management appointments. (Applies to both, written and electronic prescriptions.) No refills on procedure days. No medication will be changed or started on procedure days. No changes, adjustments, and/or refills will be conducted on a procedure day. Doing so will interfere with the diagnostic portion of the procedure. No phone refills. No medications will be "called into the pharmacy". No Fax refills. No weekend refills. No Holliday refills. No after hours refills.  Remember:  Business hours are:  Monday to Thursday 8:00 AM to 4:00 PM Provider's Schedule: Slayden Mennenga, MD - Appointments are:  Medication management: Monday and Wednesday 8:00 AM to 4:00 PM Procedure day: Tuesday and Thursday 7:30 AM to 4:00 PM Bilal Lateef, MD - Appointments are:  Medication management: Tuesday and Thursday 8:00 AM to 4:00 PM Procedure day: Monday and Wednesday 7:30  AM to 4:00 PM (Last update: 08/20/2022) ______________________________________________________________________    ____________________________________________________________________________________________  Drug Holidays  What is a "Drug Holiday"? Drug Holiday: is the name given to the process of slowly tapering down and temporarily stopping the pain medication for the purpose of decreasing or eliminating tolerance to the drug.  Benefits Improved effectiveness Decreased required effective dose Improved pain control End dependence on high dose therapy Decrease cost of therapy Uncovering "opioid-induced hyperalgesia". (OIH)  What is "opioid hyperalgesia"? It is a paradoxical increase in pain caused by exposure to opioids. Stopping the opioid pain medication, contrary to the expected, it actually decreases or completely eliminates the pain. Ref.: "A comprehensive review of opioid-induced hyperalgesia". Marion Lee, et.al. Pain Physician. 2011 Mar-Apr;14(2):145-61.  What is tolerance? Tolerance: the progressive loss of effectiveness of a pain medicine due to repetitive use. A common problem of opioid pain medications.  How long should a "Drug Holiday" last? Effectiveness depends on the patient staying off all opioid pain medicines for a minimum of 14 consecutive days. (2 weeks)  How about just taking less of the medicine? Does not work. Will not accomplish goal of eliminating the excess receptors.  How about switching to a different pain medicine? (AKA. "Opioid rotation") Does not work. Creates the illusion   of effectiveness by taking advantage of inaccurate equivalent dose calculations between different opioids. -This "technique" was promoted by studies funded by pharmaceutical companies, such as [employer], creators of "OxyContin".  Can I stop the medicine "cold turkey"? Depends. You should always coordinate with your Pain Specialist to make the transition as smoothly as  possible. Avoid stopping the medicine abruptly without consulting. We recommend a "slow taper".  What is a slow taper? Taper: refers to the gradual decrease in dose.   How do I stop/taper the dose? Slowly. Decrease the daily amount of pills that you take by one (1) pill every seven (7) days. This is called a "slow downward taper". Example: if you normally take four (4) pills per day, drop it to three (3) pills per day for seven (7) days, then to two (2) pills per day for seven (7) days, then to one (1) per day for seven (7) days, and then stop the medicine. The 14 day "Drug Holiday" starts on the first day without medicine.   Will I experience withdrawals? Unlikely with a slow taper.  What triggers withdrawals? Withdrawals are triggered by the sudden/abrupt stop of high dose opioids. Withdrawals can be avoided by slowly decreasing the dose over a prolonged period of time.  What are withdrawals? Symptoms associated with sudden/abrupt reduction/stopping of high-dose, long-term use of pain medication. Withdrawal are seldom seen on low dose therapy, or patients rarely taking opioid medication.  Early Withdrawal Symptoms may include: Agitation Anxiety Muscle aches Increased tearing Insomnia Runny nose Sweating Yawning  Late symptoms may include: Abdominal cramping Diarrhea Dilated pupils Goose bumps Nausea Vomiting  (Last update: 10/06/2022) ____________________________________________________________________________________________    ____________________________________________________________________________________________  WARNING: CBD (cannabidiol) & Delta (Delta-8 tetrahydrocannabinol) products.   Applicable to:  All individuals currently taking or considering taking CBD (cannabidiol) and, more important, all patients taking opioid analgesic controlled substances (pain medication). (Example: oxycodone; oxymorphone; hydrocodone; hydromorphone; morphine; methadone;  tramadol; tapentadol; fentanyl; buprenorphine; butorphanol; dextromethorphan; meperidine; codeine; etc.)  Introduction:  Recently there has been a drive towards the use of "natural" products for the treatment of different conditions, including pain anxiety and sleep disorders. Marijuana and hemp are two varieties of the cannabis genus plants. Marijuana and its derivatives are illegal, while hemp and its derivatives are not. Cannabidiol (CBD) and tetrahydrocannabinol (THC), are two natural compounds found in plants of the Cannabis genus. They can both be extracted from hemp or marijuana. Both compounds interact with your body's endocannabinoid system in very different ways. CBD is associated with pain relief (analgesia) while THC is associated with the psychoactive effects ("the high") obtained from the use of marijuana products. There are two main types of THC: Delta-9, which comes from the marijuana plant and it is illegal, and Delta-8, which comes from the hemp plant, and it is legal. (Both, Delta-9-THC and Delta-8-THC are psychoactive and give you "the high".)   Legality:  Marijuana and its derivatives: illegal Hemp and its derivatives: Legal (State dependent) UPDATE: (12/14/2021) The Drug Enforcement Agency (DEA) issued a letter stating that "delta" cannabinoids, including Delta-8-THCO and Delta-9-THCO, synthetically derived from hemp do not qualify as hemp and will be viewed as Schedule I drugs. (Schedule I drugs, substances, or chemicals are defined as drugs with no currently accepted medical use and a high potential for abuse. Some examples of Schedule I drugs are: heroin, lysergic acid diethylamide (LSD), marijuana (cannabis), 3,4-methylenedioxymethamphetamine (ecstasy), methaqualone, and peyote.) (https://www.dea.gov)  Legal status of CBD in [place]:  "Conditionally Legal"  Reference: "FDA Regulation of Cannabis   and Cannabis-Derived Products, Including Cannabidiol (CBD)" -  https://www.fda.gov/news-events/public-health-focus/fda-regulation-cannabis-and-cannabis-derived-products-including-cannabidiol-cbd  Warning:  CBD is not FDA approved and has not undergo the same manufacturing controls as prescription drugs.  This means that the purity and safety of available CBD may be questionable. Most of the time, despite manufacturer's claims, it is contaminated with THC (delta-9-tetrahydrocannabinol - the chemical in marijuana responsible for the "HIGH").  When this is the case, the THC contaminant will trigger a positive urine drug screen (UDS) test for Marijuana (carboxy-THC).   The FDA recently put out a warning about 5 things that everyone should be aware of regarding Delta-8 THC: Delta-8 THC products have not been evaluated or approved by the FDA for safe use and may be marketed in ways that put the public health at risk. The FDA has received adverse event reports involving delta-8 THC-containing products. Delta-8 THC has psychoactive and intoxicating effects. Delta-8 THC manufacturing often involve use of potentially harmful chemicals to create the concentrations of delta-8 THC claimed in the marketplace. The final delta-8 THC product may have potentially harmful by-products (contaminants) due to the chemicals used in the process. Manufacturing of delta-8 THC products may occur in uncontrolled or unsanitary settings, which may lead to the presence of unsafe contaminants or other potentially harmful substances. Delta-8 THC products should be kept out of the reach of children and pets.  NOTE: Because a positive UDS for any illicit substance is a violation of our medication agreement, your opioid analgesics (pain medicine) may be permanently discontinued.  MORE ABOUT CBD  General Information: CBD was discovered in 1940 and it is a derivative of the cannabis sativa genus plants (Marijuana and Hemp). It is one of the 113 identified substances found in Marijuana. It accounts  for up to 40% of the plant's extract. As of 2018, preliminary clinical studies on CBD included research for the treatment of anxiety, movement disorders, and pain. CBD is available and consumed in multiple forms, including inhalation of smoke or vapor, as an aerosol spray, and by mouth. It may be supplied as an oil containing CBD, capsules, dried cannabis, or as a liquid solution. CBD is thought not to be as psychoactive as THC (delta-9-tetrahydrocannabinol - the chemical in marijuana responsible for the "HIGH"). Studies suggest that CBD may interact with different biological target receptors in the body, including cannabinoid and other neurotransmitter receptors. As of 2018 the mechanism of action for its biological effects has not been determined.  Side-effects  Adverse reactions: Dry mouth, diarrhea, decreased appetite, fatigue, drowsiness, malaise, weakness, sleep disturbances, and others.  Drug interactions:  CBD may interact with medications such as blood-thinners. CBD causes drowsiness on its own and it will increase drowsiness caused by other medications, including antihistamines (such as Benadryl), benzodiazepines (Xanax, Ativan, Valium), antipsychotics, antidepressants, opioids, alcohol and supplements such as kava, melatonin and St. John's Wort.  Other drug interactions: Brivaracetam (Briviact); Caffeine; Carbamazepine (Tegretol); Citalopram (Celexa); Clobazam (Onfi); Eslicarbazepine (Aptiom); Everolimus (Zostress); Lithium; Methadone (Dolophine); Rufinamide (Banzel); Sedative medications (CNS depressants); Sirolimus (Rapamune); Stiripentol (Diacomit); Tacrolimus (Prograf); Tamoxifen ; Soltamox); Topiramate (Topamax); Valproate; Warfarin (Coumadin); Zonisamide. (Last update: 10/07/2022) ____________________________________________________________________________________________   ____________________________________________________________________________________________  Naloxone Nasal  Spray  Why am I receiving this medication? [place] STOP ACT requires that all patients taking high dose opioids or at risk of opioids respiratory depression, be prescribed an opioid reversal agent, such as Naloxone (AKA: Narcan).  What is this medication? NALOXONE (nal OX one) treats opioid overdose, which causes slow or shallow breathing, severe drowsiness, or trouble staying awake.   Call emergency services after using this medication. You may need additional treatment. Naloxone works by reversing the effects of opioids. It belongs to a group of medications called opioid blockers.  COMMON BRAND NAME(S): Kloxxado, Narcan  What should I tell my care team before I take this medication? They need to know if you have any of these conditions: Heart disease Substance use disorder An unusual or allergic reaction to naloxone, other medications, foods, dyes, or preservatives Pregnant or trying to get pregnant Breast-feeding  When to use this medication? This medication is to be used for the treatment of respiratory depression (less than 8 breaths per minute) secondary to opioid overdose.   How to use this medication? This medication is for use in the nose. Lay the person on their back. Support their neck with your hand and allow the head to tilt back before giving the medication. The nasal spray should be given into 1 nostril. After giving the medication, move the person onto their side. Do not remove or test the nasal spray until ready to use. Get emergency medical help right away after giving the first dose of this medication, even if the person wakes up. You should be familiar with how to recognize the signs and symptoms of a narcotic overdose. If more doses are needed, give the additional dose in the other nostril. Talk to your care team about the use of this medication in children. While this medication may be prescribed for children as young as newborns for selected conditions, precautions  do apply.  Naloxone Overdosage: If you think you have taken too much of this medicine contact a poison control center or emergency room at once.  NOTE: This medicine is only for you. Do not share this medicine with others.  What if I miss a dose? This does not apply.  What may interact with this medication? This is only used during an emergency. No interactions are expected during emergency use. This list may not describe all possible interactions. Give your health care provider a list of all the medicines, herbs, non-prescription drugs, or dietary supplements you use. Also tell them if you smoke, drink alcohol, or use illegal drugs. Some items may interact with your medicine.  What should I watch for while using this medication? Keep this medication ready for use in the case of an opioid overdose. Make sure that you have the phone number of your care team and local hospital ready. You may need to have additional doses of this medication. Each nasal spray contains a single dose. Some emergencies may require additional doses. After use, bring the treated person to the nearest hospital or call 911. Make sure the treating care team knows that the person has received a dose of this medication. You will receive additional instructions on what to do during and after use of this medication before an emergency occurs.  What side effects may I notice from receiving this medication? Side effects that you should report to your care team as soon as possible: Allergic reactions--skin rash, itching, hives, swelling of the face, lips, tongue, or throat Side effects that usually do not require medical attention (report these to your care team if they continue or are bothersome): Constipation Dryness or irritation inside the nose Headache Increase in blood pressure Muscle spasms Stuffy nose Toothache This list may not describe all possible side effects. Call your doctor for medical advice about side  effects. You may report side effects to FDA at 1-800-FDA-1088.  Where should I   keep my medication? Because this is an emergency medication, you should keep it with you at all times.  Keep out of the reach of children and pets. Store between 20 and 25 degrees C (68 and 77 degrees F). Do not freeze. Throw away any unused medication after the expiration date. Keep in original box until ready to use.  NOTE: This sheet is a summary. It may not cover all possible information. If you have questions about this medicine, talk to your doctor, pharmacist, or health care provider.   2023 Elsevier/Gold Standard (2021-06-22 00:00:00)  ____________________________________________________________________________________________   

## 2022-12-04 NOTE — Progress Notes (Signed)
Nursing Pain Medication Assessment:  Safety precautions to be maintained throughout the outpatient stay will include: orient to surroundings, keep bed in low position, maintain call bell within reach at all times, provide assistance with transfer out of bed and ambulation.  Medication Inspection Compliance: Pill count conducted under aseptic conditions, in front of the patient. Neither the pills nor the bottle was removed from the patient's sight at any time. Once count was completed pills were immediately returned to the patient in their original bottle.  Medication: Oxycodone IR Pill/Patch Count:  12 of 20 pills remain Pill/Patch Appearance: Markings consistent with prescribed medication Bottle Appearance: Standard pharmacy container. Clearly labeled. Filled Date: 2 / 20 / 2024 Last Medication intake:   12/01/22 Safety precautions to be maintained throughout the outpatient stay will include: orient to surroundings, keep bed in low position, maintain call bell within reach at all times, provide assistance with transfer out of bed and ambulation.

## 2022-12-16 NOTE — Progress Notes (Unsigned)
PROVIDER NOTE: Interpretation of information contained herein should be left to medically-trained personnel. Specific patient instructions are provided elsewhere under "Patient Instructions" section of medical record. This document was created in part using STT-dictation technology, any transcriptional errors that may result from this process are unintentional.  Patient: Karen Edwards Type: Established DOB: [birth year] MRN: [id number] PCP: Pleas Koch, NP  Service: Procedure DOS: 12/17/2022 Setting: Ambulatory Location: Ambulatory outpatient facility Delivery: Face-to-face Provider: Gaspar Cola, MD Specialty: Interventional Pain Management Specialty designation: 09 Location: Outpatient facility Ref. Prov.: Milinda Pointer, MD       Interventional Therapy   Procedure: Cervical Epidural Steroid injection (CESI) (Interlaminar) #2  Laterality: Right  Level: C7-T1 Imaging: Fluoroscopy-assisted DOS: 12/17/2022  Performed by: Milinda Pointer, MD Anesthesia: Local anesthesia (1-2% Lidocaine) Anxiolysis: None                 Sedation:                           Purpose: Diagnostic/Therapeutic Indications: Cervicalgia, cervical radicular pain, degenerative disc disease, severe enough to impact quality of life or function. No diagnosis found. NAS-11 score:   Pre-procedure:  /10   Post-procedure:  /10      Position  Prep  Materials:  Location setting: Procedure suite Position: Prone, on modified reverse trendelenburg to facilitate breathing, with head in head-cradle. Pillows positioned under chest (below chin-level) with cervical spine flexed. Safety Precautions: Patient was assessed for positional comfort and pressure points before starting the procedure. Prepping solution: DuraPrep (Iodine Povacrylex [0.7% available iodine] and Isopropyl Alcohol, 74% w/w) Prep Area: Entire  cervicothoracic region Approach: percutaneous, paramedial Intended target: Posterior  cervical epidural space Materials Procedure:  Tray: Epidural Needle(s): Epidural (Tuohy) Qty: 1 Length: (42m) 3.5-inch Gauge: 17G   Pre-op H&P Assessment:  Ms. SRossinis a 58y.o. (year old), female patient, seen today for interventional treatment. She  has a past surgical history that includes Cesarean section; Ankle surgery (Right, 03/02/2014); Hemorrhoid surgery (N/A, 06/16/2015); Cervical spine surgery (02/21/2016); Cesarean section with bilateral tubal ligation (1990); Colonoscopy (04/19/2015); Hysteroscopy with D & C (12/24/2013); Colonoscopy with propofol (N/A, 05/18/2018); Esophagogastroduodenoscopy (egd) with propofol (N/A, 05/18/2018); Spinal cord stimulator insertion (N/A, 06/18/2018); Tubal ligation; and Lumbar spinal cord simulator revision (N/A, 03/14/2020). Ms. SDashas a current medication list which includes the following prescription(s): acetaminophen, fluoxetine, naloxone, ondansetron, oxycodone, [START ON 01/09/2023] oxycodone, [START ON 02/08/2023] oxycodone, oxycodone-acetaminophen, quetiapine, and scopolamine. Her primarily concern today is the No chief complaint on file.  Initial Vital Signs:  Pulse/HCG Rate:    Temp:   Resp:   BP:   SpO2:    BMI: Estimated body mass index is 30.23 kg/m as calculated from the following:   Height as of 12/04/22: 5' 1"$  (1.549 m).   Weight as of 12/04/22: 160 lb (72.6 kg).  Risk Assessment: Allergies: Reviewed. She is allergic to gabapentin.  Allergy Precautions: None required Coagulopathies: Reviewed. None identified.  Blood-thinner therapy: None at this time Active Infection(s): Reviewed. None identified. Karen Edwards afebrile  Site Confirmation: Ms. SAkramwas asked to confirm the procedure and laterality before marking the site Procedure checklist: Completed Consent: Before the procedure and under the influence of no sedative(s), amnesic(s), or anxiolytics, the patient was informed of the treatment options, risks and  possible complications. To fulfill our ethical and legal obligations, as recommended by the American Medical Association's Code of Ethics, I have informed the patient of my clinical impression;  the nature and purpose of the treatment or procedure; the risks, benefits, and possible complications of the intervention; the alternatives, including doing nothing; the risk(s) and benefit(s) of the alternative treatment(s) or procedure(s); and the risk(s) and benefit(s) of doing nothing. The patient was provided information about the general risks and possible complications associated with the procedure. These may include, but are not limited to: failure to achieve desired goals, infection, bleeding, organ or nerve damage, allergic reactions, paralysis, and death. In addition, the patient was informed of those risks and complications associated to Spine-related procedures, such as failure to decrease pain; infection (i.e.: Meningitis, epidural or intraspinal abscess); bleeding (i.e.: epidural hematoma, subarachnoid hemorrhage, or any other type of intraspinal or peri-dural bleeding); organ or nerve damage (i.e.: Any type of peripheral nerve, nerve root, or spinal cord injury) with subsequent damage to sensory, motor, and/or autonomic systems, resulting in permanent pain, numbness, and/or weakness of one or several areas of the body; allergic reactions; (i.e.: anaphylactic reaction); and/or death. Furthermore, the patient was informed of those risks and complications associated with the medications. These include, but are not limited to: allergic reactions (i.e.: anaphylactic or anaphylactoid reaction(s)); adrenal axis suppression; blood sugar elevation that in diabetics may result in ketoacidosis or comma; water retention that in patients with history of congestive heart failure may result in shortness of breath, pulmonary edema, and decompensation with resultant heart failure; weight gain; swelling or edema;  medication-induced neural toxicity; particulate matter embolism and blood vessel occlusion with resultant organ, and/or nervous system infarction; and/or aseptic necrosis of one or more joints. Finally, the patient was informed that Medicine is not an exact science; therefore, there is also the possibility of unforeseen or unpredictable risks and/or possible complications that may result in a catastrophic outcome. The patient indicated having understood very clearly. We have given the patient no guarantees and we have made no promises. Enough time was given to the patient to ask questions, all of which were answered to the patient's satisfaction. Ms. Rosillo has indicated that she wanted to continue with the procedure. Attestation: I, the ordering provider, attest that I have discussed with the patient the benefits, risks, side-effects, alternatives, likelihood of achieving goals, and potential problems during recovery for the procedure that I have provided informed consent. Date  Time: {CHL [facility]-PAIN TIME CHOICES:[id number]}   Pre-Procedure Preparation:  Monitoring: As per clinic protocol. Respiration, ETCO2, SpO2, BP, heart rate and rhythm monitor placed and checked for adequate function Safety Precautions: Patient was assessed for positional comfort and pressure points before starting the procedure. Time-out: I initiated and conducted the "Time-out" before starting the procedure, as per protocol. The patient was asked to participate by confirming the accuracy of the "Time Out" information. Verification of the correct person, site, and procedure were performed and confirmed by me, the nursing staff, and the patient. "Time-out" conducted as per Joint Commission's Universal Protocol (UP.01.01.01). Time:    Description  Narrative of Procedure:          Start Time:   hrs. Rationale (medical necessity): procedure needed and proper for the diagnosis and/or treatment of the patient's medical symptoms and  needs. Safety Precautions: Aspiration looking for blood return was conducted prior to all injections. At no point did we inject any substances, as a needle was being advanced. No attempts were made at seeking any paresthesias. Safe injection practices and needle disposal techniques used. Medications properly checked for expiration dates. SDV (single dose vial) medications used. Description of procedure: Protocol guidelines were followed.  The patient was assisted into a comfortable position. The target area was identified and the area prepped in the usual manner. Skin & deeper tissues infiltrated with local anesthetic. Appropriate amount of time allowed to pass for local anesthetics to take effect. Using fluoroscopic guidance, the epidural needle was introduced through the skin, ipsilateral to the reported pain, and advanced to the target area. Posterior laminar os was contacted and the needle walked caudad, until the lamina was cleared. The ligamentum flavum was engaged and the epidural space identified using "loss-of-resistance technique" with 2-3 ml of PF-NaCl (0.9% NSS), in a 5cc dedicated LOR syringe. (See "Imaging guidance" below for use of contrast details.) Once proper needle placement was secured, and negative aspiration confirmed, the solution was injected in intermittent fashion, asking for systemic symptoms every 0.5cc. The needles were then removed and the area cleansed, making sure to leave some of the prepping solution back to take advantage of its long term bactericidal properties.  There were no vitals filed for this visit.   End Time:   hrs.  Imaging Guidance (Spinal):          Type of Imaging Technique: Fluoroscopy Guidance (Spinal) Indication(s): Assistance in needle guidance and placement for procedures requiring needle placement in or near specific anatomical locations not easily accessible without such assistance. Exposure Time: Please see nurses notes. Contrast: Before injecting  any contrast, we confirmed that the patient did not have an allergy to iodine, shellfish, or radiological contrast. Once satisfactory needle placement was completed at the desired level, radiological contrast was injected. Contrast injected under live fluoroscopy. No contrast complications. See chart for type and volume of contrast used. Fluoroscopic Guidance: I was personally present during the use of fluoroscopy. "Tunnel Vision Technique" used to obtain the best possible view of the target area. Parallax error corrected before commencing the procedure. "Direction-depth-direction" technique used to introduce the needle under continuous pulsed fluoroscopy. Once target was reached, antero-posterior, oblique, and lateral fluoroscopic projection used confirm needle placement in all planes. Images permanently stored in EMR. Interpretation: I personally interpreted the imaging intraoperatively. Adequate needle placement confirmed in multiple planes. Appropriate spread of contrast into desired area was observed. No evidence of afferent or efferent intravascular uptake. No intrathecal or subarachnoid spread observed. Permanent images saved into the patient's record.  Post-operative Assessment:  Post-procedure Vital Signs:  Pulse/HCG Rate:    Temp:   Resp:   BP:   SpO2:    EBL: None  Complications: No immediate post-treatment complications observed by team, or reported by patient.  Note: The patient tolerated the entire procedure well. A repeat set of vitals were taken after the procedure and the patient was kept under observation following institutional policy, for this type of procedure. Post-procedural neurological assessment was performed, showing return to baseline, prior to discharge. The patient was provided with post-procedure discharge instructions, including a section on how to identify potential problems. Should any problems arise concerning this procedure, the patient was given instructions to  immediately contact us, at any time, without hesitation. In any case, we plan to contact the patient by telephone for a follow-up status report regarding this interventional procedure.  Comments:  No additional relevant information.  Plan of Care (POC)  Orders:  No orders of the defined types were placed in this encounter.  Chronic Opioid Analgesic:  Oxycodone IR 5 mg, 1 tab PO QD (5 mg/day of oxycodone) PRN MME/day: 7.5 mg/day.   Medications ordered for procedure: No orders of the defined types were placed  in this encounter.  Medications administered: Analiz L. Arie had no medications administered during this visit.  See the medical record for exact dosing, route, and time of administration.  Follow-up plan:   No follow-ups on file.       Interventional Therapies  Risk Factors  Considerations:   Mitral valve prolapse (MVP) GERD  urinary stress incontinence  GAD   Planned  Pending:   Therapeutic right cervical ESI #2    Under consideration:   Therapeutic right L1-2 & L3-4 percutaneous discectomy ([employer]ryker compressor)  Therapeutic intrathecal pump trial    Completed:   Therapeutic right cervical ESI x1 (08/24/2020) (100/100/50/50)  Diagnostic bilateral GONB x1 (08/19/2019) (100/100/100/,50)  Palliative right cervical facet MBB x3 (07/23/2018) (100/100/100/>50)  Palliative left cervical facet MBB x4 (04/01/2019) (90/90/0/0)  Palliative right cervical facet RFA x1 (09/17/2018) (100/100/85/85)  Palliative left cervical facet RFA x1 (05/04/2019) (100/100/50/50)  Therapeutic left L1-2 LESI x1 (05/10/2021) (100/100/100 x 2 days/0)  Therapeutic right L1-2 LESI x2 (02/10/2018) (100/100/70/50-75)  Therapeutic right L1 TFESI x1 (02/10/2018) (100/100/70/50-75)  Diagnostic/therapeutic left superior cluneal NB (L2, L3 dorsal rami) x1 (04/24/2021) (0/0/0/0)  Diagnostic/therapeutic left L1 TFESI x2 (11/15/2021) (100/100/0)  Diagnostic/therapeutic left L3 TFESI x2 (11/15/2021) (100/100/0)   Bilateral lumbar spinal cord stimulator trial (done) (05/14/2018) (by me)  Bilateral lumbar spinal cord stimulator implant (09/28/2019) (by me)  Bilateral spinal cord stimulator revision (03/14/2020) (by me)  Diagnostic bilateral lumbar facet MBB x3 (11/30/2019) (100/100/50/20)  Diagnostic bilateral SI joint Blk x2 (09/18/2021) (100/100/100)  Palliative left lumbar facet RFA x3 (06/27/2022) (100/100/100/100)  Therapeutic right lumbar facet RFA x3 (05/21/2022) (100/100/75/75)    Therapeutic  Palliative (PRN) options:   Palliative right cervical facet RFA   Diagnostic bilateral SI joint Blk   Therapeutic bilateral lumbar facet RFA         Recent Visits Date Type Provider Dept  12/04/22 Office Visit Milinda Pointer, MD [facility]  Showing recent visits within past 90 days and meeting all other requirements Future Appointments Date Type Provider Dept  12/17/22 Appointment Milinda Pointer, MD [facility]  03/04/23 Appointment Gillis Santa, MD [facility]  Showing future appointments within next 90 days and meeting all other requirements  Disposition: Discharge home  Discharge (Date  Time): 12/17/2022;   hrs.   Primary Care Physician: Pleas Koch, NP Location: [facility] Note by: Gaspar Cola, MD (TTS technology used. I apologize for any typographical errors that were not detected and corrected.) Date: 12/17/2022; Time: 10:06 AM  Disclaimer:  Medicine is not an Chief Strategy Officer. The only guarantee in medicine is that nothing is guaranteed. It is important to note that the decision to proceed with this intervention was based on the information collected from the patient. The Data and conclusions were drawn from the patient's questionnaire, the interview, and the physical examination. Because the information was provided in large part by the patient, it cannot be guaranteed that it has not been purposely or  unconsciously manipulated. Every effort has been made to obtain as much relevant data as possible for this evaluation. It is important to note that the conclusions that lead to this procedure are derived in large part from the available data. Always take into account that the treatment will also be dependent on availability of resources and existing treatment guidelines, considered by other Pain Management Practitioners as being common knowledge and practice, at the time of the intervention. For Medico-Legal purposes, it is also important to point out  that variation in procedural techniques and pharmacological choices are the acceptable norm. The indications, contraindications, technique, and results of the above procedure should only be interpreted and judged by a Board-Certified Interventional Pain Specialist with extensive familiarity and expertise in the same exact procedure and technique.

## 2022-12-17 ENCOUNTER — Ambulatory Visit: Payer: Medicare PPO | Attending: Pain Medicine | Admitting: Pain Medicine

## 2022-12-17 ENCOUNTER — Encounter: Payer: Self-pay | Admitting: Pain Medicine

## 2022-12-17 ENCOUNTER — Ambulatory Visit
Admission: RE | Admit: 2022-12-17 | Discharge: 2022-12-17 | Disposition: A | Discharge status: Home / Self Care | Payer: Medicare PPO | Source: Ambulatory Visit | Attending: Pain Medicine | Admitting: Pain Medicine

## 2022-12-17 VITALS — BP 144/82 | HR 77 | Temp 97.7°F | Resp 16 | Ht 61.0 in | Wt 160.0 lb

## 2022-12-17 DIAGNOSIS — M542 Cervicalgia: Secondary | ICD-10-CM | POA: Insufficient documentation

## 2022-12-17 DIAGNOSIS — G4486 Cervicogenic headache: Secondary | ICD-10-CM | POA: Diagnosis present

## 2022-12-17 DIAGNOSIS — M5481 Occipital neuralgia: Secondary | ICD-10-CM

## 2022-12-17 DIAGNOSIS — G8929 Other chronic pain: Secondary | ICD-10-CM

## 2022-12-17 DIAGNOSIS — M25512 Pain in left shoulder: Secondary | ICD-10-CM | POA: Insufficient documentation

## 2022-12-17 DIAGNOSIS — M5030 Other cervical disc degeneration, unspecified cervical region: Secondary | ICD-10-CM | POA: Insufficient documentation

## 2022-12-17 DIAGNOSIS — M5412 Radiculopathy, cervical region: Secondary | ICD-10-CM | POA: Insufficient documentation

## 2022-12-17 DIAGNOSIS — M25511 Pain in right shoulder: Secondary | ICD-10-CM | POA: Insufficient documentation

## 2022-12-17 DIAGNOSIS — M4802 Spinal stenosis, cervical region: Secondary | ICD-10-CM

## 2022-12-17 DIAGNOSIS — Z5189 Encounter for other specified aftercare: Secondary | ICD-10-CM | POA: Diagnosis present

## 2022-12-17 MED ORDER — ROPIVACAINE HCL 2 MG/ML IJ SOLN
INTRAMUSCULAR | Status: AC
  Filled 2022-12-17: qty 20

## 2022-12-17 MED ORDER — DEXAMETHASONE SODIUM PHOSPHATE 10 MG/ML IJ SOLN
10.0000 mg | Freq: Once | INTRAMUSCULAR | Status: AC
  Administered 2022-12-17: 10 mg

## 2022-12-17 MED ORDER — MIDAZOLAM HCL 5 MG/5ML IJ SOLN
0.5000 mg | Freq: Once | INTRAMUSCULAR | Status: AC
  Administered 2022-12-17: 2 mg via INTRAVENOUS

## 2022-12-17 MED ORDER — SODIUM CHLORIDE 0.9% FLUSH
1.0000 mL | Freq: Once | INTRAVENOUS | Status: AC
  Administered 2022-12-17: 1 mL

## 2022-12-17 MED ORDER — IOHEXOL 180 MG/ML  SOLN
10.0000 mL | Freq: Once | INTRAMUSCULAR | Status: AC
  Administered 2022-12-17: 10 mL via EPIDURAL

## 2022-12-17 MED ORDER — FENTANYL CITRATE (PF) 100 MCG/2ML IJ SOLN
INTRAMUSCULAR | Status: AC
  Filled 2022-12-17: qty 2

## 2022-12-17 MED ORDER — LIDOCAINE HCL 2 % IJ SOLN
20.0000 mL | Freq: Once | INTRAMUSCULAR | Status: AC
  Administered 2022-12-17: 100 mg

## 2022-12-17 MED ORDER — LACTATED RINGERS IV SOLN: Freq: Once | INTRAVENOUS | Status: AC

## 2022-12-17 MED ORDER — TRIAMCINOLONE ACETONIDE 40 MG/ML IJ SUSP
INTRAMUSCULAR | Status: AC
  Filled 2022-12-17: qty 1

## 2022-12-17 MED ORDER — IOHEXOL 180 MG/ML  SOLN
INTRAMUSCULAR | Status: AC
  Filled 2022-12-17: qty 10

## 2022-12-17 MED ORDER — FENTANYL CITRATE (PF) 100 MCG/2ML IJ SOLN
25.0000 ug | INTRAMUSCULAR | Status: DC | PRN
  Administered 2022-12-17: 50 ug via INTRAVENOUS

## 2022-12-17 MED ORDER — SODIUM CHLORIDE (PF) 0.9 % IJ SOLN
INTRAMUSCULAR | Status: AC
  Filled 2022-12-17: qty 10

## 2022-12-17 MED ORDER — ROPIVACAINE HCL 2 MG/ML IJ SOLN
1.0000 mL | Freq: Once | INTRAMUSCULAR | Status: AC
  Administered 2022-12-17: 1 mL via EPIDURAL

## 2022-12-17 MED ORDER — PENTAFLUOROPROP-TETRAFLUOROETH EX AERO
INHALATION_SPRAY | Freq: Once | CUTANEOUS | Status: AC
  Administered 2022-12-17: 30 via TOPICAL
  Filled 2022-12-17: qty 116

## 2022-12-17 MED ORDER — LIDOCAINE HCL (PF) 2 % IJ SOLN
INTRAMUSCULAR | Status: AC
  Filled 2022-12-17: qty 10

## 2022-12-17 MED ORDER — MIDAZOLAM HCL 5 MG/5ML IJ SOLN
INTRAMUSCULAR | Status: AC
  Filled 2022-12-17: qty 5

## 2022-12-17 NOTE — Patient Instructions (Signed)
  ____________________________________________________________________________________________  Patient Information update  To: All of our patients.  Re: Name change.  It has been made official that our current name, "[facility]"   will soon be changed to "[facility]".   The purpose of this change is to eliminate any confusion created by the concept of our practice being a "Medication Management Pain Clinic". In the past this has led to the misconception that we treat pain primarily by the use of prescription medications.  Nothing can be farther from the truth.   Understanding PAIN MANAGEMENT: To further understand what our practice does, you first have to understand that "Pain Management" is a subspecialty that requires additional training once a physician has completed their specialty training, which can be in either Anesthesia, Neurology, Psychiatry, or Physical Medicine and Rehabilitation (PMR). Each one of these contributes to the final approach taken by each physician to the management of their patient's pain. To be a "Pain Management Specialist" you must have first completed one of the specialty trainings below.  Anesthesiologists - trained in clinical pharmacology and interventional techniques such as nerve blockade and regional as well as central neuroanatomy. They are trained to block pain before, during, and after surgical interventions.  Neurologists - trained in the diagnosis and pharmacological treatment of complex neurological conditions, such as Multiple Sclerosis, Parkinson's, spinal cord injuries, and other systemic conditions that may be associated with symptoms that may include but are not limited to pain. They tend to rely primarily on the treatment of chronic pain using prescription medications.  Psychiatrist - trained in conditions affecting the psychosocial  wellbeing of patients including but not limited to depression, anxiety, schizophrenia, personality disorders, addiction, and other substance use disorders that may be associated with chronic pain. They tend to rely primarily on the treatment of chronic pain using prescription medications.   Physical Medicine and Rehabilitation (PMR) physicians, also known as physiatrists - trained to treat a wide variety of medical conditions affecting the brain, spinal cord, nerves, bones, joints, ligaments, muscles, and tendons. Their training is primarily aimed at treating patients that have suffered injuries that have caused severe physical impairment. Their training is primarily aimed at the physical therapy and rehabilitation of those patients. They may also work alongside orthopedic surgeons or neurosurgeons using their expertise in assisting surgical patients to recover after their surgeries.  INTERVENTIONAL PAIN MANAGEMENT is sub-subspecialty of Pain Management.  Our physicians are Board-certified in Anesthesia, Pain Management, and Interventional Pain Management.  This meaning that not only have they been trained and Board-certified in their specialty of Anesthesia, and subspecialty of Pain Management, but they have also received further training in the sub-subspecialty of Interventional Pain Management, in order to become Board-certified as INTERVENTIONAL PAIN MANAGEMENT SPECIALIST.    Mission: Our goal is to use our skills in  INTERVENTIONAL PAIN MANAGEMENT as alternatives to the chronic use of prescription opioid medications for the treatment of pain. To make this more clear, we have changed our name to reflect what we do and offer. We will continue to offer medication management assessment and recommendations, but we will not be taking over any patient's medication management.  ____________________________________________________________________________________________       cheeks); mood swings; menstrual changes. Uncommon: Long-term decrease or suppression of natural hormones; bone thinning. (These are more common with higher doses or more frequent use. This is why we prefer that our patients avoid having any injection therapies in other practices.)  Very Rare: Severe mood changes; psychosis; aseptic necrosis. From procedure: Some discomfort is to be expected once  the numbing medicine wears off. This should be minimal if ice and heat are applied as instructed.  Call if: (When should I call?) You experience numbness and weakness that gets worse with time, as opposed to wearing off. New onset bowel or bladder incontinence. (Applies only to procedures done in the spine)  Emergency Numbers: Durning business hours (Monday - Thursday, 8:00 AM - 4:00 PM) (Friday, 9:00 AM - 12:00 Noon): [phone] After hours: [phone] NOTE: If you are having a problem and are unable connect with, or to talk to a provider, then go to your nearest urgent care or emergency department. If the problem is serious and urgent, please call 911. ____________________________________________________________________________________________

## 2022-12-17 NOTE — Progress Notes (Signed)
Safety precautions to be maintained throughout the outpatient stay will include: orient to surroundings, keep bed in low position, maintain call bell within reach at all times, provide assistance with transfer out of bed and ambulation.  

## 2022-12-18 ENCOUNTER — Telehealth: Payer: Self-pay

## 2022-12-18 NOTE — Telephone Encounter (Signed)
Post procedure follow up.  Patient states she is doing good.  

## 2022-12-31 ENCOUNTER — Ambulatory Visit: Payer: Medicare PPO | Attending: Pain Medicine | Admitting: Pain Medicine

## 2022-12-31 DIAGNOSIS — M542 Cervicalgia: Secondary | ICD-10-CM | POA: Diagnosis not present

## 2022-12-31 DIAGNOSIS — G4486 Cervicogenic headache: Secondary | ICD-10-CM | POA: Diagnosis not present

## 2022-12-31 DIAGNOSIS — M5459 Other low back pain: Secondary | ICD-10-CM | POA: Insufficient documentation

## 2022-12-31 DIAGNOSIS — M25512 Pain in left shoulder: Secondary | ICD-10-CM

## 2022-12-31 DIAGNOSIS — M545 Low back pain, unspecified: Secondary | ICD-10-CM

## 2022-12-31 DIAGNOSIS — M47817 Spondylosis without myelopathy or radiculopathy, lumbosacral region: Secondary | ICD-10-CM

## 2022-12-31 DIAGNOSIS — G8929 Other chronic pain: Secondary | ICD-10-CM

## 2022-12-31 DIAGNOSIS — M5412 Radiculopathy, cervical region: Secondary | ICD-10-CM

## 2022-12-31 DIAGNOSIS — M47816 Spondylosis without myelopathy or radiculopathy, lumbar region: Secondary | ICD-10-CM

## 2022-12-31 DIAGNOSIS — M25511 Pain in right shoulder: Secondary | ICD-10-CM | POA: Diagnosis not present

## 2022-12-31 NOTE — Patient Instructions (Signed)

## 2022-12-31 NOTE — Progress Notes (Signed)
Patient: Karen Edwards  Service Category: E/M  Provider: Gaspar Cola, MD  DOB: 07/25/1966  DOS: 12/31/2022  Location: Office  MRN: [id number]  Setting: Ambulatory outpatient  Referring Provider: Pleas Koch, NP  Type: Established Patient  Specialty: Interventional Pain Management  PCP: Pleas Koch, NP  Location: Remote location  Delivery: TeleHealth     Virtual Encounter - Pain Management PROVIDER NOTE: Information contained herein reflects review and annotations entered in association with encounter. Interpretation of such information and data should be left to medically-trained personnel. Information provided to patient can be located elsewhere in the medical record under "Patient Instructions". Document created using STT-dictation technology, any transcriptional errors that may result from process are unintentional.    Contact & Pharmacy Preferred: [phone] Home: [phone] (home) Mobile: [phone] (mobile) E-mail: [email or web address] .[place], [place] [address] Phone: [phone] Fax: [phone]   Pre-screening  Karen Edwards offered "in-person" vs "virtual" encounter. She indicated preferring virtual for this encounter.   Reason COVID-19*  Social distancing based on CDC and AMA recommendations.   I contacted Karen Edwards on 12/31/2022 via telephone.      I clearly identified myself as Gaspar Cola, MD. I verified that I was speaking with the correct person using two identifiers (Name: Karen Edwards, and date of birth: 20-Oct-1966).  Consent I sought verbal advanced consent from Karen Edwards for virtual visit interactions. I informed Karen Edwards of possible security and privacy concerns, risks, and limitations associated with providing "not-in-person" medical evaluation and management services. I also informed Karen Edwards of the availability of "in-person"  appointments. Finally, I informed her that there would be a charge for the virtual visit and that she could be  personally, fully or partially, financially responsible for it. Karen Edwards expressed understanding and agreed to proceed.   Historic Elements   Karen Edwards is a 57 y.o. year old, female patient evaluated today after our last contact on 12/17/2022. Karen Edwards  has a past medical history of Anxiety, Chronic back pain, Degenerative joint disease, Depression, Headache, Hypotension, IBS (irritable bowel syndrome) (05/2015), MVP (mitral valve prolapse), and Nausea and vomiting. She also  has a past surgical history that includes Cesarean section; Ankle surgery (Right, 03/02/2014); Hemorrhoid surgery (N/A, 06/16/2015); Cervical spine surgery (02/21/2016); Cesarean section with bilateral tubal ligation (1990); Colonoscopy (04/19/2015); Hysteroscopy with D & C (12/24/2013); Colonoscopy with propofol (N/A, 05/18/2018); Esophagogastroduodenoscopy (egd) with propofol (N/A, 05/18/2018); Spinal cord stimulator insertion (N/A, 06/18/2018); Tubal ligation; and Lumbar spinal cord simulator revision (N/A, 03/14/2020). Karen Edwards has a current medication list which includes the following prescription(s): acetaminophen, fluoxetine, naloxone, ondansetron, oxycodone, [START ON 01/09/2023] oxycodone, [START ON 02/08/2023] oxycodone, oxycodone-acetaminophen, quetiapine, and scopolamine. She  reports that she has been smoking cigarettes. She has a 18.50 pack-year smoking history. She has never used smokeless tobacco. She reports current alcohol use of about 1.0 standard drink of alcohol per week. She reports that she does not use drugs. Karen Edwards is allergic to gabapentin.  Estimated body mass index is 30.23 kg/m as calculated from the following:   Height as of 12/17/22: '5\' 1"'$  (1.549 m).   Weight as of 12/17/22: 160 lb (72.6 kg).  HPI  Today, she is being contacted for a post-procedure assessment.  The patient  indicates having attained excellent benefit from the cervical epidural steroid injection.  In fact, she indicates that currently she is not experiencing  any pain in the neck or upper back region including the upper extremities.  However, she also indicates that her mother recently fractured her L L1 vertebral body and she is in the hospital staying with her and this has aggravated her low back pain.  She describes having pain in the left lower back and this pain seems to be running down the back of the left leg to the knee.  She also refers having a little bit of that pain in the lateral aspect of the leg.  I asked the patient to perform a provocative hyperextension rotational maneuver towards the left side and she indicated that this exactly reproduced her pain.  Exactly reproduced her pain.  I then asked the patient to cross her left leg over her right knee and she indicated that she felt a little bit of a pull, but she did not really feel much pain in the area of the hip.  She has requested that we bring her in for a Lumbar facet block, which I believe is very appropriate.  Post-procedure evaluation   Procedure: Cervical Epidural Steroid injection (CESI) (Interlaminar) #2  Laterality: Right  Level: C7-T1 Imaging: Fluoroscopy-assisted DOS: 12/17/2022  Performed by: Milinda Pointer, MD Anesthesia: Local anesthesia (1-2% Lidocaine) Anxiolysis: IV Versed 2.0 mg Sedation: Moderate Sedation Fentanyl 0.5 mL (25 mcg)   Purpose: Diagnostic/Therapeutic Indications: Cervicalgia, cervical radicular pain, degenerative disc disease, severe enough to impact quality of life or function. 1. Cervicalgia   2. Cervicogenic headache (Bilateral)   3. Cervico-occipital neuralgia (Bilateral)   4. Chronic cervical radicular pain (Right: C6/C7) (Left: C5/T1)   5. Cervical radiculopathy at C6 (Bilateral)   6. Cervical foraminal stenosis (C5-6) (Bilateral)   7. Chronic neck pain (4th area of Pain) (Bilateral) (R>L)    8. DDD (degenerative disc disease), cervical   9. Encounter for therapeutic procedure    NAS-11 score:   Pre-procedure: 7 /10   Post-procedure: 0-No pain/10      Effectiveness:  Initial hour after procedure: 100 %. Subsequent 4-6 hours post-procedure: 100 %. Analgesia past initial 6 hours: 90 %. Ongoing improvement:  Analgesic: The patient indicates currently enjoying a 100% relief of the pain in the cervical region.  She indicates the pain in the neck and the headaches to be gone.  She also indicates the upper extremity radiculitis to also be gone. Function: Karen Edwards reports improvement in function ROM: Karen Edwards reports improvement in ROM  Pharmacotherapy Assessment   Opioid Analgesic: Oxycodone IR 5 mg, 1 tab PO QD (5 mg/day of oxycodone) PRN MME/day: 7.5 mg/day.   Monitoring: [place] PMP: PDMP reviewed during this encounter.       Pharmacotherapy: No side-effects or adverse reactions reported. Compliance: No problems identified. Effectiveness: Clinically acceptable. Plan: Refer to "POC". UDS:  Summary  Date Value Ref Range Status  09/11/2022 Note  Final    Comment:    ==================================================================== [employer] Select 13 (MW) ==================================================================== Test                             Result       Flag       Units  Drug Present not Declared for Prescription Verification   Hydrocodone                    395          UNEXPECTED ng/mg creat   Norhydrocodone  583          UNEXPECTED ng/mg creat    Sources of hydrocodone include scheduled prescription medications.    Norhydrocodone is an expected metabolite of hydrocodone.  Drug Absent but Declared for Prescription Verification   Oxycodone                      Not Detected UNEXPECTED ng/mg creat ==================================================================== Test                      Result    Flag   Units      Ref Range    Creatinine              165              mg/dL      >=20 ==================================================================== Declared Medications:  The flagging and interpretation on this report are based on the  following declared medications.  Unexpected results may arise from  inaccuracies in the declared medications.   **Note: The testing scope of this panel includes these medications:   Oxycodone (OxyIR)  Oxycodone (Percocet)   **Note: The testing scope of this panel does not include the  following reported medications:   Acetaminophen (Tylenol)  Acetaminophen (Percocet)  Cyclobenzaprine (Flexeril)  Fluoxetine (Prozac)  Naloxone (Narcan)  Ondansetron (Zofran)  Scopolamine  Tizanidine (Zanaflex)  Trazodone (Desyrel) ==================================================================== For clinical consultation, please call [phone]. ====================================================================    No results found for: "CBDTHCR", "D8THCCBX", "D9THCCBX"   Laboratory Chemistry Profile   Renal Lab Results  Component Value Date   BUN 12 02/01/2021   CREATININE 0.85 02/01/2021   BCR 8 (L) 09/08/2017   GFR 77.34 02/01/2021   GFRAA 93 09/08/2017   GFRNONAA 81 09/08/2017    Hepatic Lab Results  Component Value Date   AST 15 02/01/2021   ALT 11 02/01/2021   ALBUMIN 4.4 02/01/2021   ALKPHOS 59 02/01/2021    Electrolytes Lab Results  Component Value Date   NA 139 02/01/2021   K 4.2 02/01/2021   CL 105 02/01/2021   CALCIUM 9.3 02/01/2021   MG 2.1 09/08/2017    Bone Lab Results  Component Value Date   VD25OH 43.74 01/10/2016   25OHVITD1 51 09/08/2017   25OHVITD2 <1.0 09/08/2017   25OHVITD3 51 09/08/2017    Inflammation (CRP: Acute Phase) (ESR: Chronic Phase) Lab Results  Component Value Date   CRP 1.4 09/08/2017   ESRSEDRATE 11 09/08/2017         Note: Above Lab results reviewed.  Imaging  DG PAIN CLINIC C-ARM 1-60 MIN NO REPORT Fluoro  was used, but no Radiologist interpretation will be provided.  Please refer to "NOTES" tab for provider progress note.  Assessment  The primary encounter diagnosis was Chronic neck pain (4th area of Pain) (Bilateral) (R>L). Diagnoses of Cervicogenic headache (Bilateral), Chronic shoulder pain (Bilateral) (L>R), Chronic cervical radicular pain (Right: C6/C7) (Left: C5/T1), Acute exacerbation of chronic low back pain, Chronic low back pain (Left) w/o sciatica, Lumbar facet syndrome (Bilateral) (L>R), Lumbar facet hypertrophy (Bilateral), Spondylosis without myelopathy or radiculopathy, lumbosacral region, and Lumbar facet joint pain were also pertinent to this visit.  Plan of Care  Problem-specific:  No problem-specific Assessment & Plan notes found for this encounter.  Karen Edwards has a current medication list which includes the following long-term medication(s): fluoxetine, oxycodone, [START ON 01/09/2023] oxycodone, [START ON 02/08/2023] oxycodone, and quetiapine.  Pharmacotherapy (Medications Ordered): No orders of the defined  types were placed in this encounter.  Orders:  Orders Placed This Encounter  Procedures   LUMBAR FACET(MEDIAL BRANCH NERVE BLOCK) MBNB    Diagnosis: Lumbar Facet Syndrome (M47.816); Lumbosacral Facet Syndrome (M47.817); Lumbar Facet Joint Pain (M54.59) Medical Necessity Statement: 1.Severe chronic axial low back pain causing functional impairment documented by ongoing pain scale assessments. 2.Pain present for longer than 3 months (Chronic) documented to have failed noninvasive conservative therapies. 3.Absence of untreated radiculopathy. 4.There is no radiological evidence of untreated fractures, tumor, infection, or deformity.  Physical Examination Findings: Positive Lumbar Hyperextension-Rotation provocative test: (Y)    Standing Status:   Future    Standing Expiration Date:   04/02/2023    Scheduling Instructions:     Procedure: Lumbar facet Medial  Branch Block (MBB)     Side: Left-sided     Level: L3-4, L4-5, and L5-S1 Facets (L2, L3, L4, L5, and S1 Medial Branch)     Sedation: With Sedation.     Timeframe: ASAP    Order Specific Question:   Where will this procedure be performed?    Answer:   [facility]   Follow-up plan:   Return for (ECT): (L) L-FCT Blk #4.      Interventional Therapies  Risk Factors  Considerations:   Mitral valve prolapse (MVP) GERD  urinary stress incontinence  GAD   Planned  Pending:   Diagnostic/therapeutic left lumbar facet MBB #4    Under consideration:   Therapeutic right L1-2 & L3-4 percutaneous discectomy (Stryker compressor)  Therapeutic intrathecal pump trial    Completed:   Therapeutic right cervical ESI x2 (12/17/2022) (100/100/90/100)  Diagnostic bilateral GONB x1 (08/19/2019) (100/100/100/,50)  Palliative right cervical facet MBB x3 (07/23/2018) (100/100/100/>50)  Palliative left cervical facet MBB x4 (04/01/2019) (90/90/0/0)  Palliative right cervical facet RFA x1 (09/17/2018) (100/100/85/85)  Palliative left cervical facet RFA x1 (05/04/2019) (100/100/50/50)  Therapeutic left L1-2 LESI x1 (05/10/2021) (100/100/100 x 2 days/0)  Therapeutic right L1-2 LESI x2 (02/10/2018) (100/100/70/50-75)  Therapeutic right L1 TFESI x1 (02/10/2018) (100/100/70/50-75)  Diagnostic/therapeutic left superior cluneal NB (L2, L3 dorsal rami) x1 (04/24/2021) (0/0/0/0)  Diagnostic/therapeutic left L1 TFESI x2 (11/15/2021) (100/100/0)  Diagnostic/therapeutic left L3 TFESI x2 (11/15/2021) (100/100/0)  Bilateral lumbar spinal cord stimulator trial (done) (05/14/2018) (by me)  Bilateral lumbar spinal cord stimulator implant (09/28/2019) (by me)  Bilateral spinal cord stimulator revision (03/14/2020) (by me)  Diagnostic bilateral lumbar facet MBB x3 (11/30/2019) (100/100/50/20)  Diagnostic bilateral SI joint Blk x2 (09/18/2021) (100/100/100)  Palliative left lumbar facet RFA x3 (06/27/2022) (100/100/100/100)   Therapeutic right lumbar facet RFA x3 (05/21/2022) (100/100/75/75)    Therapeutic  Palliative (PRN) options:   Palliative right cervical facet RFA   Diagnostic bilateral SI joint Blk   Therapeutic bilateral lumbar facet RFA    Pharmacotherapy  Nonopioids transfer 08/23/2020: Lidocaine 5% ointment and Robaxin       Recent Visits Date Type Provider Dept  12/17/22 Procedure visit Milinda Pointer, MD [facility]  12/04/22 Office Visit Milinda Pointer, MD [facility]  Showing recent visits within past 90 days and meeting all other requirements Today's Visits Date Type Provider Dept  12/31/22 Office Visit Milinda Pointer, MD [facility]  Showing today's visits and meeting all other requirements Future Appointments Date Type Provider Dept  03/04/23 Appointment Gillis Santa, MD [facility]  Showing future appointments within next 90 days and meeting all other requirements  I discussed the assessment and treatment plan with the patient. The patient was provided an  opportunity to ask questions and all were answered. The patient agreed with the plan and demonstrated an understanding of the instructions.  Patient advised to call back or seek an in-person evaluation if the symptoms or condition worsens.  Duration of encounter: 14 minutes.  Note by: Gaspar Cola, MD Date: 12/31/2022; Time: 12:01 PM

## 2023-01-14 ENCOUNTER — Encounter: Payer: Self-pay | Admitting: Pain Medicine

## 2023-01-14 ENCOUNTER — Ambulatory Visit: Payer: Medicare PPO | Attending: Pain Medicine | Admitting: Pain Medicine

## 2023-01-14 ENCOUNTER — Ambulatory Visit
Admission: RE | Admit: 2023-01-14 | Discharge: 2023-01-14 | Disposition: A | Discharge status: Home / Self Care | Payer: Medicare PPO | Source: Ambulatory Visit | Attending: Pain Medicine | Admitting: Pain Medicine

## 2023-01-14 VITALS — BP 126/60 | HR 85 | Temp 97.8°F | Resp 14 | Ht 60.0 in | Wt 155.0 lb

## 2023-01-14 DIAGNOSIS — M47817 Spondylosis without myelopathy or radiculopathy, lumbosacral region: Secondary | ICD-10-CM

## 2023-01-14 DIAGNOSIS — M47816 Spondylosis without myelopathy or radiculopathy, lumbar region: Secondary | ICD-10-CM | POA: Diagnosis not present

## 2023-01-14 DIAGNOSIS — G8929 Other chronic pain: Secondary | ICD-10-CM | POA: Insufficient documentation

## 2023-01-14 DIAGNOSIS — M5459 Other low back pain: Secondary | ICD-10-CM | POA: Diagnosis present

## 2023-01-14 DIAGNOSIS — M5136 Other intervertebral disc degeneration, lumbar region: Secondary | ICD-10-CM | POA: Diagnosis present

## 2023-01-14 DIAGNOSIS — M51369 Other intervertebral disc degeneration, lumbar region without mention of lumbar back pain or lower extremity pain: Secondary | ICD-10-CM

## 2023-01-14 DIAGNOSIS — M545 Low back pain, unspecified: Secondary | ICD-10-CM | POA: Diagnosis present

## 2023-01-14 DIAGNOSIS — R937 Abnormal findings on diagnostic imaging of other parts of musculoskeletal system: Secondary | ICD-10-CM

## 2023-01-14 MED ORDER — FENTANYL CITRATE (PF) 100 MCG/2ML IJ SOLN
25.0000 ug | INTRAMUSCULAR | Status: DC | PRN
  Administered 2023-01-14: 75 ug via INTRAVENOUS

## 2023-01-14 MED ORDER — PENTAFLUOROPROP-TETRAFLUOROETH EX AERO
INHALATION_SPRAY | Freq: Once | CUTANEOUS | Status: DC
  Filled 2023-01-14: qty 116

## 2023-01-14 MED ORDER — LIDOCAINE HCL 2 % IJ SOLN
INTRAMUSCULAR | Status: AC
  Filled 2023-01-14: qty 20

## 2023-01-14 MED ORDER — LIDOCAINE HCL 2 % IJ SOLN
20.0000 mL | Freq: Once | INTRAMUSCULAR | Status: AC
  Administered 2023-01-14: 400 mg

## 2023-01-14 MED ORDER — MIDAZOLAM HCL 5 MG/5ML IJ SOLN
INTRAMUSCULAR | Status: AC
  Filled 2023-01-14: qty 5

## 2023-01-14 MED ORDER — ROPIVACAINE HCL 2 MG/ML IJ SOLN
9.0000 mL | Freq: Once | INTRAMUSCULAR | Status: AC
  Administered 2023-01-14: 9 mL via PERINEURAL

## 2023-01-14 MED ORDER — FENTANYL CITRATE (PF) 100 MCG/2ML IJ SOLN
INTRAMUSCULAR | Status: AC
  Filled 2023-01-14: qty 2

## 2023-01-14 MED ORDER — TRIAMCINOLONE ACETONIDE 40 MG/ML IJ SUSP
INTRAMUSCULAR | Status: AC
  Filled 2023-01-14: qty 1

## 2023-01-14 MED ORDER — TRIAMCINOLONE ACETONIDE 40 MG/ML IJ SUSP
40.0000 mg | Freq: Once | INTRAMUSCULAR | Status: AC
  Administered 2023-01-14: 40 mg

## 2023-01-14 MED ORDER — LACTATED RINGERS IV SOLN: Freq: Once | INTRAVENOUS | Status: AC

## 2023-01-14 MED ORDER — ROPIVACAINE HCL 2 MG/ML IJ SOLN
INTRAMUSCULAR | Status: AC
  Filled 2023-01-14: qty 20

## 2023-01-14 MED ORDER — MIDAZOLAM HCL 5 MG/5ML IJ SOLN
0.5000 mg | Freq: Once | INTRAMUSCULAR | Status: AC
  Administered 2023-01-14: 2 mg via INTRAVENOUS

## 2023-01-14 NOTE — Patient Instructions (Signed)
____________________________________________________________________________________________  Virtual Visits   ID our calls: Add these numbers to your list of contacts on your smart phone. Label it as "PAIN Management" Nursing: [phone] (Main) Dr. Kadeen Sroka: [phone]   What is a "Virtual Visit"? It is an electronic healthcare encounter (medical visit) that takes place on real time (NOT TEXT or E-MAIL) over the telephone or computer device (desktop, laptop, tablet, smart phone, etc.). It allows for more communication flexibility between the patient and the healthcare provider.  Who decides when these types of visits will be used? The physician.  Who is eligible for these types of visits? Only those patients that can be reliably reached over the telephone.  What do you mean by reliably? We do not have time to call everyone multiple times, therefore those patients that tend to screen calls and then call back later are not suitable candidates for this system. We all hate telemarketers and "Robocalls".  We understand how people are reluctant to pickup on calls from "unknown numbers", therefore, we suggest you add our numbers to your list of contacts. This way, you should be able to identify our calls. All of our numbers are available above.   Who is not eligible? This option is not appropriate for medication management. Patients on controlled substances have to come in for "Face-to-Face" encounters. Monitoring of these substances is mandatory. Virtual visits do not allow for unannounced drug screening tests or pill counts. Not bringing your pills, or the empty bottles, may result in no refill.  When will this type of visits be used? For follow-up after procedures on established patients, as long as they have had the same procedure done before.  Whenever you are physically unable to attend a regular appointment.   Can I request my medication visit to be "Virtual"? Yes. Available on  a limited basis, only if you are unable to physically attend your appointment. However, you may only receive a 30-day prescription. Abuse of this option may result in discontinuation of medication due to inability to properly monitor the therapy.  When will I be called?  You will receive an initial call from [phone], from our nursing staff, one business day prior to your appointment. (For Monday appointments you will be called on Friday.) The purpose of this call is to review your medications and the results of any recent procedure.  If the nursing staff is unable to make contact, your virtual encounter may be canceled and rescheduled to a face-to-face visit.  Your provider will call you on the day of your appointment.  At what time will I be called? Providers will call whenever there is time available. Do not expect calls at any specific time. On the schedule, you will have an appointment time assigned to you however, this is seldom accurate. This is done simply to keep a list of patients to be called. Be advised that calls may come at anytime during the day. Calls start as early as 8:00 AM and go as late as 8:00 PM. This will depend on provider availability. The system is not perfect. If this is inconvenient for you, please request to be changed to an "in-person" appointment.   ____________________________________________________________________________________________    ____________________________________________________________________________________________  Post-Procedure Discharge Instructions  Instructions: Apply ice:  Purpose: This will minimize any swelling and discomfort after procedure.  When: Day of procedure, as soon as you get home. How: Fill a plastic sandwich bag with crushed ice. Cover it with a small towel and apply to injection   site. How long: (15 min on, 15 min off) Apply for 15 minutes then remove x 15 minutes.  Repeat sequence on day of procedure, until you go to  bed. Apply heat:  Purpose: To treat any soreness and discomfort from the procedure. When: Starting the next day after the procedure. How: Apply heat to procedure site starting the day following the procedure. How long: May continue to repeat daily, until discomfort goes away. Food intake: Start with clear liquids (like water) and advance to regular food, as tolerated.  Physical activities: Keep activities to a minimum for the first 8 hours after the procedure. After that, then as tolerated. Driving: If you have received any sedation, be responsible and do not drive. You are not allowed to drive for 24 hours after having sedation. Blood thinner: (Applies only to those taking blood thinners) You may restart your blood thinner 6 hours after your procedure. Insulin: (Applies only to Diabetic patients taking insulin) As soon as you can eat, you may resume your normal dosing schedule. Infection prevention: Keep procedure site clean and dry. Shower daily and clean area with soap and water. Post-procedure Pain Diary: Extremely important that this be done correctly and accurately. Recorded information will be used to determine the next step in treatment. For the purpose of accuracy, follow these rules: Evaluate only the area treated. Do not report or include pain from an untreated area. For the purpose of this evaluation, ignore all other areas of pain, except for the treated area. After your procedure, avoid taking a long nap and attempting to complete the pain diary after you wake up. Instead, set your alarm clock to go off every hour, on the hour, for the initial 8 hours after the procedure. Document the duration of the numbing medicine, and the relief you are getting from it. Do not go to sleep and attempt to complete it later. It will not be accurate. If you received sedation, it is likely that you were given a medication that may cause amnesia. Because of this, completing the diary at a later time may  cause the information to be inaccurate. This information is needed to plan your care. Follow-up appointment: Keep your post-procedure follow-up evaluation appointment after the procedure (usually 2 weeks for most procedures, 6 weeks for radiofrequencies). DO NOT FORGET to bring you pain diary with you.   Expect: (What should I expect to see with my procedure?) From numbing medicine (AKA: Local Anesthetics): Numbness or decrease in pain. You may also experience some weakness, which if present, could last for the duration of the local anesthetic. Onset: Full effect within 15 minutes of injected. Duration: It will depend on the type of local anesthetic used. On the average, 1 to 8 hours.  From steroids (Applies only if steroids were used): Decrease in swelling or inflammation. Once inflammation is improved, relief of the pain will follow. Onset of benefits: Depends on the amount of swelling present. The more swelling, the longer it will take for the benefits to be seen. In some cases, up to 10 days. Duration: Steroids will stay in the system x 2 weeks. Duration of benefits will depend on multiple posibilities including persistent irritating factors. Side-effects: If present, they may typically last 2 weeks (the duration of the steroids). Frequent: Cramps (if they occur, drink Gatorade and take over-the-counter Magnesium 450-500 mg once to twice a day); water retention with temporary weight gain; increases in blood sugar; decreased immune system response; increased appetite. Occasional: Facial flushing (red, warm   cheeks); mood swings; menstrual changes. Uncommon: Long-term decrease or suppression of natural hormones; bone thinning. (These are more common with higher doses or more frequent use. This is why we prefer that our patients avoid having any injection therapies in other practices.)  Very Rare: Severe mood changes; psychosis; aseptic necrosis. From procedure: Some discomfort is to be expected once  the numbing medicine wears off. This should be minimal if ice and heat are applied as instructed.  Call if: (When should I call?) You experience numbness and weakness that gets worse with time, as opposed to wearing off. New onset bowel or bladder incontinence. (Applies only to procedures done in the spine)  Emergency Numbers: Durning business hours (Monday - Thursday, 8:00 AM - 4:00 PM) (Friday, 9:00 AM - 12:00 Noon): [phone] After hours: [phone] NOTE: If you are having a problem and are unable connect with, or to talk to a provider, then go to your nearest urgent care or emergency department. If the problem is serious and urgent, please call 911. ____________________________________________________________________________________________    

## 2023-01-14 NOTE — Progress Notes (Signed)
PROVIDER NOTE: Interpretation of information contained herein should be left to medically-trained personnel. Specific patient instructions are provided elsewhere under "Patient Instructions" section of medical record. This document was created in part using STT-dictation technology, any transcriptional errors that may result from this process are unintentional.  Patient: Karen Edwards Type: Established DOB: 1966-08-05 MRN: [id number] PCP: Pleas Koch, NP  Service: Procedure DOS: 01/14/2023 Setting: Ambulatory Location: Ambulatory outpatient facility Delivery: Face-to-face Provider: Gaspar Cola, MD Specialty: Interventional Pain Management Specialty designation: 09 Location: Outpatient facility Ref. Prov.: Milinda Pointer, MD       Interventional Therapy   Procedure: Lumbar Facet, Medial Branch Block(s) #4  Laterality: Left  Level: L3, L4, L5, and S1 Medial Branch Level(s). Injecting these levels blocks the L4-5 and L5-S1 lumbar facet joints. Imaging: Fluoroscopic guidance         Anesthesia: Local anesthesia (1-2% Lidocaine) Anxiolysis: IV Versed 2.0 mg Sedation: Moderate Sedation Fentanyl 1.5 mL (75 mcg) DOS: 01/14/2023 Performed by: Gaspar Cola, MD  Primary Purpose: Diagnostic/Therapeutic Indications: Low back pain severe enough to impact quality of life or function. 1. 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 Lumbar facet joint pain   3. Lumbar facet hypertrophy (Bilateral)   4. Spondylosis without myelopathy or radiculopathy, lumbosacral region   5. DDD (degenerative disc disease), lumbar   6. Chronic low back pain (Left) w/o sciatica   7. Abnormal MRI, lumbar spine (04/17/2021)    NAS-11 Pain score:   Pre-procedure: 7 /10   Post-procedure: 0-No pain/10     Position / Prep / Materials:  Position: Prone  Prep solution: DuraPrep (Iodine Povacrylex [0.7% available iodine] and Isopropyl Alcohol, 74% w/w) Area Prepped: Posterolateral Lumbosacral  Spine (Wide prep: From the lower border of the scapula down to the end of the tailbone and from flank to flank.)  Materials:  Tray: Block Needle(s):  Type: Spinal  Gauge (G): 22  Length: 5-in Qty: 4      Pre-op H&P Assessment:  Karen Edwards is a 57 y.o. (year old), female patient, seen today for interventional treatment. She  has a past surgical history that includes Cesarean section; Ankle surgery (Right, 03/02/2014); Hemorrhoid surgery (N/A, 06/16/2015); Cervical spine surgery (02/21/2016); Cesarean section with bilateral tubal ligation (1990); Colonoscopy (04/19/2015); Hysteroscopy with D & C (12/24/2013); Colonoscopy with propofol (N/A, 05/18/2018); Esophagogastroduodenoscopy (egd) with propofol (N/A, 05/18/2018); Spinal cord stimulator insertion (N/A, 06/18/2018); Tubal ligation; and Lumbar spinal cord simulator revision (N/A, 03/14/2020). Karen Edwards has a current medication list which includes the following prescription(s): acetaminophen, fluoxetine, naloxone, ondansetron, oxycodone, [START ON 02/08/2023] oxycodone, oxycodone-acetaminophen, quetiapine, scopolamine, and oxycodone, and the following Facility-Administered Medications: fentanyl, lactated ringers, and pentafluoroprop-tetrafluoroeth. Her primarily concern today is the Back Pain (lower)  Initial Vital Signs:  Pulse/HCG Rate: 85ECG Heart Rate: 68 (NSR) Temp: 97.8 F (36.6 C) Resp: 15 BP: 110/81 SpO2: 100 %  BMI: Estimated body mass index is 30.27 kg/m as calculated from the following:   Height as of this encounter: 5' (1.524 m).   Weight as of this encounter: 155 lb (70.3 kg).  Risk Assessment: Allergies: Reviewed. She is allergic to gabapentin.  Allergy Precautions: None required Coagulopathies: Reviewed. None identified.  Blood-thinner therapy: None at this time Active Infection(s): Reviewed. None identified. Karen Edwards is afebrile  Site Confirmation: Ms. [name] was asked to confirm the procedure and laterality  before marking the site Procedure checklist: Completed Consent: Before the procedure and under the influence of no sedative(s), amnesic(s), or anxiolytics, the patient was informed  of the treatment options, risks and possible complications. To fulfill our ethical and legal obligations, as recommended by the American Medical Association's Code of Ethics, I have informed the patient of my clinical impression; the nature and purpose of the treatment or procedure; the risks, benefits, and possible complications of the intervention; the alternatives, including doing nothing; the risk(s) and benefit(s) of the alternative treatment(s) or procedure(s); and the risk(s) and benefit(s) of doing nothing. The patient was provided information about the general risks and possible complications associated with the procedure. These may include, but are not limited to: failure to achieve desired goals, infection, bleeding, organ or nerve damage, allergic reactions, paralysis, and death. In addition, the patient was informed of those risks and complications associated to Spine-related procedures, such as failure to decrease pain; infection (i.e.: Meningitis, epidural or intraspinal abscess); bleeding (i.e.: epidural hematoma, subarachnoid hemorrhage, or any other type of intraspinal or peri-dural bleeding); organ or nerve damage (i.e.: Any type of peripheral nerve, nerve root, or spinal cord injury) with subsequent damage to sensory, motor, and/or autonomic systems, resulting in permanent pain, numbness, and/or weakness of one or several areas of the body; allergic reactions; (i.e.: anaphylactic reaction); and/or death. Furthermore, the patient was informed of those risks and complications associated with the medications. These include, but are not limited to: allergic reactions (i.e.: anaphylactic or anaphylactoid reaction(s)); adrenal axis suppression; blood sugar elevation that in diabetics may result in ketoacidosis or comma;  water retention that in patients with history of congestive heart failure may result in shortness of breath, pulmonary edema, and decompensation with resultant heart failure; weight gain; swelling or edema; medication-induced neural toxicity; particulate matter embolism and blood vessel occlusion with resultant organ, and/or nervous system infarction; and/or aseptic necrosis of one or more joints. Finally, the patient was informed that Medicine is not an exact science; therefore, there is also the possibility of unforeseen or unpredictable risks and/or possible complications that may result in a catastrophic outcome. The patient indicated having understood very clearly. We have given the patient no guarantees and we have made no promises. Enough time was given to the patient to ask questions, all of which were answered to the patient's satisfaction. Ms. Ghormley has indicated that she wanted to continue with the procedure. Attestation: I, the ordering provider, attest that I have discussed with the patient the benefits, risks, side-effects, alternatives, likelihood of achieving goals, and potential problems during recovery for the procedure that I have provided informed consent. Date  Time: 01/14/2023  9:20 AM   Pre-Procedure Preparation:  Monitoring: As per clinic protocol. Respiration, ETCO2, SpO2, BP, heart rate and rhythm monitor placed and checked for adequate function Safety Precautions: Patient was assessed for positional comfort and pressure points before starting the procedure. Time-out: I initiated and conducted the "Time-out" before starting the procedure, as per protocol. The patient was asked to participate by confirming the accuracy of the "Time Out" information. Verification of the correct person, site, and procedure were performed and confirmed by me, the nursing staff, and the patient. "Time-out" conducted as per Joint Commission's Universal Protocol (UP.01.01.01). Time:  1015  Description of Procedure:          Laterality: (see above) Targeted Levels: (see above)  Safety Precautions: Aspiration looking for blood return was conducted prior to all injections. At no point did we inject any substances, as a needle was being advanced. Before injecting, the patient was told to immediately notify me if she was experiencing any new onset of "ringing in  the ears, or metallic taste in the mouth". No attempts were made at seeking any paresthesias. Safe injection practices and needle disposal techniques used. Medications properly checked for expiration dates. SDV (single dose vial) medications used. After the completion of the procedure, all disposable equipment used was discarded in the proper designated medical waste containers. Local Anesthesia: Protocol guidelines were followed. The patient was positioned over the fluoroscopy table. The area was prepped in the usual manner. The time-out was completed. The target area was identified using fluoroscopy. A 12-in long, straight, sterile hemostat was used with fluoroscopic guidance to locate the targets for each level blocked. Once located, the skin was marked with an approved surgical skin marker. Once all sites were marked, the skin (epidermis, dermis, and hypodermis), as well as deeper tissues (fat, connective tissue and muscle) were infiltrated with a small amount of a short-acting local anesthetic, loaded on a 10cc syringe with a 25G, 1.5-in  Needle. An appropriate amount of time was allowed for local anesthetics to take effect before proceeding to the next step. Local Anesthetic: Lidocaine 2.0% The unused portion of the local anesthetic was discarded in the proper designated containers. Technical description of process:   L3 Medial Branch Nerve Block (MBB): The target area for the L3 medial branch is at the junction of the postero-lateral aspect of the superior articular process and the superior, posterior, and medial edge of the  transverse process of L4. Under fluoroscopic guidance, a Quincke needle was inserted until contact was made with os over the superior postero-lateral aspect of the pedicular shadow (target area). After negative aspiration for blood, 0.5 mL of the nerve block solution was injected without difficulty or complication. The needle was removed intact. L4 Medial Branch Nerve Block (MBB): The target area for the L4 medial branch is at the junction of the postero-lateral aspect of the superior articular process and the superior, posterior, and medial edge of the transverse process of L5. Under fluoroscopic guidance, a Quincke needle was inserted until contact was made with os over the superior postero-lateral aspect of the pedicular shadow (target area). After negative aspiration for blood, 0.5 mL of the nerve block solution was injected without difficulty or complication. The needle was removed intact. L5 Medial Branch Nerve Block (MBB): The target area for the L5 medial branch is at the junction of the postero-lateral aspect of the superior articular process and the superior, posterior, and medial edge of the sacral ala. Under fluoroscopic guidance, a Quincke needle was inserted until contact was made with os over the superior postero-lateral aspect of the pedicular shadow (target area). After negative aspiration for blood, 0.5 mL of the nerve block solution was injected without difficulty or complication. The needle was removed intact. S1 Medial Branch Nerve Block (MBB): The target area for the S1 medial branch is at the posterior and inferior 6 o'clock position of the L5-S1 facet joint. Under fluoroscopic guidance, the Quincke needle inserted for the L5 MBB was redirected until contact was made with os over the inferior and postero aspect of the sacrum, at the 6 o' clock position under the L5-S1 facet joint (Target area). After negative aspiration for blood, 0.5 mL of the nerve block solution was injected without  difficulty or complication. The needle was removed intact.  Once the entire procedure was completed, the treated area was cleaned, making sure to leave some of the prepping solution back to take advantage of its long term bactericidal properties.  Illustration of the posterior view of the lumbar spine and the posterior neural structures. Laminae of L2 through S1 are labeled. DPRL5, dorsal primary ramus of L5; DPRS1, dorsal primary ramus of S1; DPR3, dorsal primary ramus of L3; FJ, facet (zygapophyseal) joint L3-L4; I, inferior articular process of L4; LB1, lateral branch of dorsal primary ramus of L1; IAB, inferior articular branches from L3 medial branch (supplies L4-L5 facet joint); IBP, intermediate branch plexus; MB3, medial branch of dorsal primary ramus of L3; NR3, third lumbar nerve root; S, superior articular process of L5; SAB, superior articular branches from L4 (supplies L4-5 facet joint also); TP3, transverse process of L3.  Vitals:   01/14/23 1015 01/14/23 1020 01/14/23 1024 01/14/23 1032  BP: 121/82 (!) 152/78 137/80 139/78  Pulse:      Resp: 15 16 16 18   Temp:      SpO2: 96% 95% 98% 96%  Weight:      Height:         Start Time: 1015 hrs. End Time: 1023 hrs.  Imaging Guidance (Spinal):          Type of Imaging Technique: Fluoroscopy Guidance (Spinal) Indication(s): Assistance in needle guidance and placement for procedures requiring needle placement in or near specific anatomical locations not easily accessible without such assistance. Exposure Time: Please see nurses notes. Contrast: None used. Fluoroscopic Guidance: I was personally present during the use of fluoroscopy. "Tunnel Vision Technique" used to obtain the best possible view of the target area. Parallax error corrected before commencing the procedure. "Direction-depth-direction" technique used to introduce the needle under continuous pulsed fluoroscopy. Once target was reached, antero-posterior, oblique,  and lateral fluoroscopic projection used confirm needle placement in all planes. Images permanently stored in EMR. Interpretation: No contrast injected. I personally interpreted the imaging intraoperatively. Adequate needle placement confirmed in multiple planes. Permanent images saved into the patient's record.  Post-operative Assessment:  Post-procedure Vital Signs:  Pulse/HCG Rate: 8578 (NSR) Temp: 97.8 F (36.6 C) Resp: 18 BP: 139/78 SpO2: 96 %  EBL: None  Complications: No immediate post-treatment complications observed by team, or reported by patient.  Note: The patient tolerated the entire procedure well. A repeat set of vitals were taken after the procedure and the patient was kept under observation following institutional policy, for this type of procedure. Post-procedural neurological assessment was performed, showing return to baseline, prior to discharge. The patient was provided with post-procedure discharge instructions, including a section on how to identify potential problems. Should any problems arise concerning this procedure, the patient was given instructions to immediately contact us, at any time, without hesitation. In any case, we plan to contact the patient by telephone for a follow-up status report regarding this interventional procedure.  Comments:  No additional relevant information.  Plan of Care (POC)  Orders:  Orders Placed This Encounter  Procedures   LUMBAR FACET(MEDIAL BRANCH NERVE BLOCK) MBNB    Scheduling Instructions:     Procedure: Lumbar facet block (AKA.: Lumbosacral medial branch nerve block)     Side: Left-sided     Level: TBD Facets (TBD Medial Branch Nerves)     Sedation: Patient's choice.     Timeframe: Today    Order Specific Question:   Where will this procedure be performed?    Answer:   [facility]   DG [facility] C-ARM 1-60 MIN NO REPORT    Intraoperative interpretation by procedural physician at [place].     Standing Status:   Standing  Number of Occurrences:   1    Order Specific Question:   Reason for exam:    Answer:   Assistance in needle guidance and placement for procedures requiring needle placement in or near specific anatomical locations not easily accessible without such assistance.   Informed Consent Details: Physician/Practitioner Attestation; Transcribe to consent form and obtain patient signature    Nursing Order: Transcribe to consent form and obtain patient signature. Note: Always confirm laterality of pain with Ms. Tye Savoy, before procedure.    Order Specific Question:   Physician/Practitioner attestation of informed consent for procedure/surgical case    Answer:   I, the physician/practitioner, attest that I have discussed with the patient the benefits, risks, side effects, alternatives, likelihood of achieving goals and potential problems during recovery for the procedure that I have provided informed consent.    Order Specific Question:   Procedure    Answer:   Lumbar Facet Block  under fluoroscopic guidance    Order Specific Question:   Physician/Practitioner performing the procedure    Answer:   Kodie Pick A. Dossie Arbour MD    Order Specific Question:   Indication/Reason    Answer:   Low Back Pain, with our without leg pain, due to Facet Joint Arthralgia (Joint Pain) Spondylosis (Arthritis of the Spine), without myelopathy or radiculopathy (Nerve Damage).   Provide equipment / supplies at bedside    Procedure tray: "Block Tray" (Disposable  single use) Skin infiltration needle: Regular 1.5-in, 25-G, (x1) Block Needle type: Spinal Amount/quantity: 4 Size: Regular (3.5-inch) Gauge: 22G    Standing Status:   Standing    Number of Occurrences:   1    Order Specific Question:   Specify    Answer:   Block Tray   Chronic Opioid Analgesic:  Oxycodone IR 5 mg, 1 tab PO QD (5 mg/day of oxycodone) PRN MME/day: 7.5 mg/day.   Medications ordered for procedure: Meds ordered this  encounter  Medications   lidocaine (XYLOCAINE) 2 % (with pres) injection 400 mg   pentafluoroprop-tetrafluoroeth (GEBAUERS) aerosol   lactated ringers infusion   midazolam (VERSED) 5 MG/5ML injection 0.5-2 mg    Make sure Flumazenil is available in the pyxis when using this medication. If oversedation occurs, administer 0.2 mg IV over 15 sec. If after 45 sec no response, administer 0.2 mg again over 1 min; may repeat at 1 min intervals; not to exceed 4 doses (1 mg)   fentaNYL (SUBLIMAZE) injection 25-50 mcg    Make sure Narcan is available in the pyxis when using this medication. In the event of respiratory depression (RR< 8/min): Titrate NARCAN (naloxone) in increments of 0.1 to 0.2 mg IV at 2-3 minute intervals, until desired degree of reversal.   ropivacaine (PF) 2 mg/mL (0.2%) (NAROPIN) injection 9 mL   triamcinolone acetonide (KENALOG-40) injection 40 mg   Medications administered: We administered lidocaine, lactated ringers, midazolam, fentaNYL, ropivacaine (PF) 2 mg/mL (0.2%), and triamcinolone acetonide.  See the medical record for exact dosing, route, and time of administration.  Follow-up plan:   No follow-ups on file.       Interventional Therapies  Risk Factors  Considerations:   Mitral valve prolapse (MVP) GERD  urinary stress incontinence  GAD   Planned  Pending:   Diagnostic/therapeutic left lumbar facet MBB #4    Under consideration:   Therapeutic right L1-2 & L3-4 percutaneous discectomy ([employer] compressor)  Therapeutic intrathecal pump trial    Completed:   Therapeutic right cervical ESI x2 (12/17/2022) (100/100/90/100)  Diagnostic bilateral  GONB x1 (08/19/2019) (100/100/100/,50)  Palliative right cervical facet MBB x3 (07/23/2018) (100/100/100/>50)  Palliative left cervical facet MBB x4 (04/01/2019) (90/90/0/0)  Palliative right cervical facet RFA x1 (09/17/2018) (100/100/85/85)  Palliative left cervical facet RFA x1 (05/04/2019) (100/100/50/50)  Therapeutic  left L1-2 LESI x1 (05/10/2021) (100/100/100 x 2 days/0)  Therapeutic right L1-2 LESI x2 (02/10/2018) (100/100/70/50-75)  Therapeutic right L1 TFESI x1 (02/10/2018) (100/100/70/50-75)  Diagnostic/therapeutic left superior cluneal NB (L2, L3 dorsal rami) x1 (04/24/2021) (0/0/0/0)  Diagnostic/therapeutic left L1 TFESI x2 (11/15/2021) (100/100/0)  Diagnostic/therapeutic left L3 TFESI x2 (11/15/2021) (100/100/0)  Bilateral lumbar spinal cord stimulator trial (done) (05/14/2018) (by me)  Bilateral lumbar spinal cord stimulator implant (09/28/2019) (by me)  Bilateral spinal cord stimulator revision (03/14/2020) (by me)  Diagnostic bilateral lumbar facet MBB x3 (11/30/2019) (100/100/50/20)  Diagnostic bilateral SI joint Blk x2 (09/18/2021) (100/100/100)  Palliative left lumbar facet RFA x3 (06/27/2022) (100/100/100/100)  Therapeutic right lumbar facet RFA x3 (05/21/2022) (100/100/75/75)    Therapeutic  Palliative (PRN) options:   Palliative right cervical facet RFA   Diagnostic bilateral SI joint Blk   Therapeutic bilateral lumbar facet RFA    Pharmacotherapy  Nonopioids transfer 08/23/2020: Lidocaine 5% ointment and Robaxin       Recent Visits Date Type Provider Dept  12/31/22 Office Visit Milinda Pointer, MD [facility]  12/17/22 Procedure visit Milinda Pointer, MD [facility]  12/04/22 Office Visit Milinda Pointer, MD [facility]  Showing recent visits within past 90 days and meeting all other requirements Today's Visits Date Type Provider Dept  01/14/23 Procedure visit Milinda Pointer, MD [facility]  Showing today's visits and meeting all other requirements Future Appointments Date Type Provider Dept  01/28/23 Appointment Milinda Pointer, MD [facility]  03/04/23 Appointment Gillis Santa, MD [facility]  Showing future appointments within next 90 days and meeting all other requirements  Disposition: Discharge  home  Discharge (Date  Time): 01/14/2023; 1105 hrs.   Primary Care Physician: Pleas Koch, NP Location: [facility] Note by: Gaspar Cola, MD (TTS technology used. I apologize for any typographical errors that were not detected and corrected.) Date: 01/14/2023; Time: 10:48 AM  Disclaimer:  Medicine is not an Chief Strategy Officer. The only guarantee in medicine is that nothing is guaranteed. It is important to note that the decision to proceed with this intervention was based on the information collected from the patient. The Data and conclusions were drawn from the patient's questionnaire, the interview, and the physical examination. Because the information was provided in large part by the patient, it cannot be guaranteed that it has not been purposely or unconsciously manipulated. Every effort has been made to obtain as much relevant data as possible for this evaluation. It is important to note that the conclusions that lead to this procedure are derived in large part from the available data. Always take into account that the treatment will also be dependent on availability of resources and existing treatment guidelines, considered by other Pain Management Practitioners as being common knowledge and practice, at the time of the intervention. For Medico-Legal purposes, it is also important to point out that variation in procedural techniques and pharmacological choices are the acceptable norm. The indications, contraindications, technique, and results of the above procedure should only be interpreted and judged by a Board-Certified Interventional Pain Specialist with extensive familiarity and expertise in the same exact procedure and technique.

## 2023-01-15 ENCOUNTER — Telehealth: Payer: Self-pay

## 2023-01-15 NOTE — Telephone Encounter (Signed)
Post procedure follow up.  Lm 

## 2023-01-17 ENCOUNTER — Other Ambulatory Visit: Payer: Self-pay | Admitting: Primary Care

## 2023-01-17 DIAGNOSIS — Z1231 Encounter for screening mammogram for malignant neoplasm of breast: Secondary | ICD-10-CM

## 2023-01-27 NOTE — Progress Notes (Unsigned)
Patient: Karen Edwards  Service Category: E/M  Provider: Gaspar Cola, MD  DOB: [birth year]  DOS: 01/28/2023  Location: Office  MRN: [id number]  Setting: Ambulatory outpatient  Referring Provider: Pleas Koch, NP  Type: Established Patient  Specialty: Interventional Pain Management  PCP: Pleas Koch, NP  Location: Remote location  Delivery: TeleHealth     Virtual Encounter - Pain Management PROVIDER NOTE: Information contained herein reflects review and annotations entered in association with encounter. Interpretation of such information and data should be left to medically-trained personnel. Information provided to patient can be located elsewhere in the medical record under "Patient Instructions". Document created using STT-dictation technology, any transcriptional errors that may result from process are unintentional.    Contact & Pharmacy Preferred: [phone] Home: [phone] (home) Mobile: [phone] (mobile) E-mail: [email or web address], [place] [address] Phone: [phone] Fax: [phone]   Pre-screening  Karen Edwards offered "in-person" vs "virtual" encounter. She indicated preferring virtual for this encounter.   Reason COVID-19*  Social distancing based on CDC and AMA recommendations.   I contacted Karen Edwards on 01/28/2023 via telephone.      I clearly identified myself as Gaspar Cola, MD. I verified that I was speaking with the correct person using two identifiers (Name: Karen Edwards, and date of birth: 12/15/1965).  Consent I sought verbal advanced consent from Karen Edwards for virtual visit interactions. I informed Karen Edwards of possible security and privacy concerns, risks, and limitations associated with providing "not-in-person" medical evaluation and management services. I also informed Karen Edwards of the availability of "in-person"  appointments. Finally, I informed her that there would be a charge for the virtual visit and that she could be  personally, fully or partially, financially responsible for it. Karen Edwards expressed understanding and agreed to proceed.   Historic Elements   Karen Edwards is a 57 y.o. year old, female patient evaluated today after our last contact on 01/14/2023. Karen Edwards  has a past medical history of Anxiety, Chronic back pain, Degenerative joint disease, Depression, Headache, Hypotension, IBS (irritable bowel syndrome) (05/2015), MVP (mitral valve prolapse), and Nausea and vomiting. She also  has a past surgical history that includes Cesarean section; Ankle surgery (Right, 03/02/2014); Hemorrhoid surgery (N/A, 06/16/2015); Cervical spine surgery (02/21/2016); Cesarean section with bilateral tubal ligation (1990); Colonoscopy (04/19/2015); Hysteroscopy with D & C (12/24/2013); Colonoscopy with propofol (N/A, 05/18/2018); Esophagogastroduodenoscopy (egd) with propofol (N/A, 05/18/2018); Spinal cord stimulator insertion (N/A, 06/18/2018); Tubal ligation; and Lumbar spinal cord simulator revision (N/A, 03/14/2020). Karen Edwards has a current medication list which includes the following prescription(s): acetaminophen, fluoxetine, naloxone, ondansetron, oxycodone, oxycodone, [START ON 02/08/2023] oxycodone, oxycodone-acetaminophen, quetiapine, and scopolamine. She  reports that she has been smoking cigarettes. She has a 18.50 pack-year smoking history. She has never used smokeless tobacco. She reports current alcohol use of about 1.0 standard drink of alcohol per week. She reports that she does not use drugs. Karen Edwards is allergic to gabapentin.  BMI: Estimated body mass index is 30.27 kg/m as calculated from the following:   Height as of 01/14/23: 5' (1.524 m).   Weight as of 01/14/23: 155 lb (70.3 kg). Last encounter: 12/31/2022. Last procedure: 01/14/2023.  HPI  Today, she is being contacted for a  post-procedure assessment.  Post-procedure evaluation   Procedure: Lumbar Facet, Medial Branch Block(s) #4  Laterality: Left  Level: L3, L4, L5, and S1  Medial Branch Level(s). Injecting these levels blocks the L4-5 and L5-S1 lumbar facet joints. Imaging: Fluoroscopic guidance         Anesthesia: Local anesthesia (1-2% Lidocaine) Anxiolysis: IV Versed 2.0 mg Sedation: Moderate Sedation Fentanyl 1.5 mL (75 mcg) DOS: 01/14/2023 Performed by: Gaspar Cola, MD  Primary Purpose: Diagnostic/Therapeutic Indications: Low back pain severe enough to impact quality of life or function. 1. 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 Lumbar facet joint pain   3. Lumbar facet hypertrophy (Bilateral)   4. Spondylosis without myelopathy or radiculopathy, lumbosacral region   5. DDD (degenerative disc disease), lumbar   6. Chronic low back pain (Left) w/o sciatica   7. Abnormal MRI, lumbar spine (04/17/2021)    NAS-11 Pain score:   Pre-procedure: 7 /10   Post-procedure: 0-No pain/10      Effectiveness:  Initial hour after procedure: 100 %. Subsequent 4-6 hours post-procedure: 100 %. Analgesia past initial 6 hours: 100 % (75% overall improvement.). Ongoing improvement:  Analgesic:  *** Function:    ***    ROM:    ***     Pharmacotherapy Assessment   Opioid Analgesic: Oxycodone IR 5 mg, 1 tab PO QD (5 mg/day of oxycodone) PRN MME/day: 7.5 mg/day.   Monitoring: [place] PMP: PDMP not reviewed this encounter.       Pharmacotherapy: No side-effects or adverse reactions reported. Compliance: No problems identified. Effectiveness: Clinically acceptable. Plan: Refer to "POC". UDS:  Summary  Date Value Ref Range Status  09/11/2022 Note  Final    Comment:    ==================================================================== [employer] [facility] (MW) ==================================================================== Test                             Result       Flag       Units  Drug  Present not Declared for Prescription Verification   Hydrocodone                    395          UNEXPECTED ng/mg creat   Norhydrocodone                 583          UNEXPECTED ng/mg creat    Sources of hydrocodone include scheduled prescription medications.    Norhydrocodone is an expected metabolite of hydrocodone.  Drug Absent but Declared for Prescription Verification   Oxycodone                      Not Detected UNEXPECTED ng/mg creat ==================================================================== Test                      Result    Flag   Units      Ref Range   Creatinine              165              mg/dL      >=20 ==================================================================== Declared Medications:  The flagging and interpretation on this report are based on the  following declared medications.  Unexpected results may arise from  inaccuracies in the declared medications.   **Note: The testing scope of this panel includes these medications:   Oxycodone (OxyIR)  Oxycodone (Percocet)   **Note: The testing scope of this panel does not include the  following reported medications:   Acetaminophen (Tylenol)  Acetaminophen (  Percocet)  Cyclobenzaprine (Flexeril)  Fluoxetine (Prozac)  Naloxone (Narcan)  Ondansetron (Zofran)  Scopolamine  Tizanidine (Zanaflex)  Trazodone (Desyrel) ==================================================================== For clinical consultation, please call [phone]. ====================================================================    No results found for: "CBDTHCR", "D8THCCBX", "D9THCCBX"   Laboratory Chemistry Profile   Renal Lab Results  Component Value Date   BUN 12 02/01/2021   CREATININE 0.85 02/01/2021   BCR 8 (L) 09/08/2017   GFR 77.34 02/01/2021   GFRAA 93 09/08/2017   GFRNONAA 81 09/08/2017    Hepatic Lab Results  Component Value Date   AST 15 02/01/2021   ALT 11 02/01/2021   ALBUMIN 4.4 02/01/2021    ALKPHOS 59 02/01/2021    Electrolytes Lab Results  Component Value Date   NA 139 02/01/2021   K 4.2 02/01/2021   CL 105 02/01/2021   CALCIUM 9.3 02/01/2021   MG 2.1 09/08/2017    Bone Lab Results  Component Value Date   VD25OH 43.74 01/10/2016   25OHVITD1 51 09/08/2017   25OHVITD2 <1.0 09/08/2017   25OHVITD3 51 09/08/2017    Inflammation (CRP: Acute Phase) (ESR: Chronic Phase) Lab Results  Component Value Date   CRP 1.4 09/08/2017   ESRSEDRATE 11 09/08/2017         Note: Above Lab results reviewed.  Imaging  DG PAIN CLINIC C-ARM 1-60 MIN NO REPORT Fluoro was used, but no Radiologist interpretation will be provided.  Please refer to "NOTES" tab for provider progress note.  Assessment  The primary encounter diagnosis was Lumbar facet joint pain. Diagnoses of Lumbar facet syndrome (Bilateral) (L>R) and Chronic low back pain (Left) w/o sciatica were also pertinent to this visit.  Plan of Care  Problem-specific:  No problem-specific Assessment & Plan notes found for this encounter.  Karen Edwards has a current medication list which includes the following long-term medication(s): fluoxetine, oxycodone, oxycodone, [START ON 02/08/2023] oxycodone, and quetiapine.  Pharmacotherapy (Medications Ordered): No orders of the defined types were placed in this encounter.  Orders:  No orders of the defined types were placed in this encounter.  Follow-up plan:   No follow-ups on file.      Interventional Therapies  Risk Factors  Considerations:   Mitral valve prolapse (MVP) GERD  urinary stress incontinence  GAD   Planned  Pending:   Diagnostic/therapeutic left lumbar facet MBB #4    Under consideration:   Therapeutic right L1-2 & L3-4 percutaneous discectomy ([employer] compressor)  Therapeutic intrathecal pump trial    Completed:   Therapeutic right cervical ESI x2 (12/17/2022) (100/100/90/100)  Diagnostic bilateral GONB x1 (08/19/2019) (100/100/100/,50)   Palliative right cervical facet MBB x3 (07/23/2018) (100/100/100/>50)  Palliative left cervical facet MBB x4 (04/01/2019) (90/90/0/0)  Palliative right cervical facet RFA x1 (09/17/2018) (100/100/85/85)  Palliative left cervical facet RFA x1 (05/04/2019) (100/100/50/50)  Therapeutic left L1-2 LESI x1 (05/10/2021) (100/100/100 x 2 days/0)  Therapeutic right L1-2 LESI x2 (02/10/2018) (100/100/70/50-75)  Therapeutic right L1 TFESI x1 (02/10/2018) (100/100/70/50-75)  Diagnostic/therapeutic left superior cluneal NB (L2, L3 dorsal rami) x1 (04/24/2021) (0/0/0/0)  Diagnostic/therapeutic left L1 TFESI x2 (11/15/2021) (100/100/0)  Diagnostic/therapeutic left L3 TFESI x2 (11/15/2021) (100/100/0)  Bilateral lumbar spinal cord stimulator trial (done) (05/14/2018) (by me)  Bilateral lumbar spinal cord stimulator implant (09/28/2019) (by me)  Bilateral spinal cord stimulator revision (03/14/2020) (by me)  Diagnostic bilateral lumbar facet MBB x3 (11/30/2019) (100/100/50/20)  Diagnostic bilateral SI joint Blk x2 (09/18/2021) (100/100/100)  Palliative left lumbar facet RFA x3 (06/27/2022) (100/100/100/100)  Therapeutic right lumbar facet RFA x3 (  05/21/2022) (100/100/75/75)    Therapeutic  Palliative (PRN) options:   Palliative right cervical facet RFA   Diagnostic bilateral SI joint Blk   Therapeutic bilateral lumbar facet RFA    Pharmacotherapy  Nonopioids transfer 08/23/2020: Lidocaine 5% ointment and Robaxin        Recent Visits Date Type Provider Dept  01/14/23 Procedure visit Milinda Pointer, MD [facility]  12/31/22 Office Visit Milinda Pointer, MD [facility]  12/17/22 Procedure visit Milinda Pointer, MD [facility]  12/04/22 Office Visit Milinda Pointer, MD [facility]  Showing recent visits within past 90 days and meeting all other requirements Future Appointments Date Type Provider Dept  01/28/23 Office Visit Milinda Pointer, [facility]  03/04/23 Appointment Gillis Santa, MD [facility]  Showing future appointments within next 90 days and meeting all other requirements  I discussed the assessment and treatment plan with the patient. The patient was provided an opportunity to ask questions and all were answered. The patient agreed with the plan and demonstrated an understanding of the instructions.  Patient advised to call back or seek an in-person evaluation if the symptoms or condition worsens.  Duration of encounter: *** minutes.  Note by: Gaspar Cola, MD Date: 01/28/2023; Time: 11:13 AM

## 2023-01-28 ENCOUNTER — Ambulatory Visit: Payer: Medicare PPO | Attending: Pain Medicine | Admitting: Pain Medicine

## 2023-01-28 DIAGNOSIS — M79604 Pain in right leg: Secondary | ICD-10-CM

## 2023-01-28 DIAGNOSIS — G8929 Other chronic pain: Secondary | ICD-10-CM

## 2023-01-28 DIAGNOSIS — M5442 Lumbago with sciatica, left side: Secondary | ICD-10-CM

## 2023-01-28 DIAGNOSIS — G894 Chronic pain syndrome: Secondary | ICD-10-CM | POA: Diagnosis not present

## 2023-01-28 DIAGNOSIS — M47816 Spondylosis without myelopathy or radiculopathy, lumbar region: Secondary | ICD-10-CM | POA: Diagnosis not present

## 2023-01-28 DIAGNOSIS — M25552 Pain in left hip: Secondary | ICD-10-CM

## 2023-01-28 DIAGNOSIS — M542 Cervicalgia: Secondary | ICD-10-CM

## 2023-01-28 DIAGNOSIS — M5481 Occipital neuralgia: Secondary | ICD-10-CM

## 2023-01-28 DIAGNOSIS — M5459 Other low back pain: Secondary | ICD-10-CM

## 2023-01-28 DIAGNOSIS — Z79891 Long term (current) use of opiate analgesic: Secondary | ICD-10-CM

## 2023-01-28 DIAGNOSIS — Z79899 Other long term (current) drug therapy: Secondary | ICD-10-CM

## 2023-01-28 DIAGNOSIS — M25551 Pain in right hip: Secondary | ICD-10-CM

## 2023-01-28 DIAGNOSIS — M5441 Lumbago with sciatica, right side: Secondary | ICD-10-CM | POA: Diagnosis not present

## 2023-01-28 DIAGNOSIS — M79605 Pain in left leg: Secondary | ICD-10-CM

## 2023-01-28 DIAGNOSIS — M545 Low back pain, unspecified: Secondary | ICD-10-CM

## 2023-01-28 MED ORDER — OXYCODONE HCL 5 MG PO TABS: 5.0000 mg | ORAL_TABLET | Freq: Two times a day (BID) | ORAL | 0 refills | Status: DC | PRN

## 2023-01-28 NOTE — Patient Instructions (Signed)
____________________________________________________________________________________________  Opioid Pain Medication Update  To: All patients taking opioid pain medications. (I.e.: hydrocodone, hydromorphone, oxycodone, oxymorphone, morphine, codeine, methadone, tapentadol, tramadol, buprenorphine, fentanyl, etc.)  Re: Updated review of side effects and adverse reactions of opioid analgesics, as well as new information about long term effects of this class of medications.  Direct risks of long-term opioid therapy are not limited to opioid addiction and overdose. Potential medical risks include serious fractures, breathing problems during sleep, hyperalgesia, immunosuppression, chronic constipation, bowel obstruction, myocardial infarction, and tooth decay secondary to xerostomia.  Unpredictable adverse effects that can occur even if you take your medication correctly: Cognitive impairment, respiratory depression, and death. Most people think that if they take their medication "correctly", and "as instructed", that they will be safe. Nothing could be farther from the truth. In reality, a significant amount of recorded deaths associated with the use of opioids has occurred in individuals that had taken the medication for a long time, and were taking their medication correctly. The following are examples of how this can happen: Patient taking his/her medication for a long time, as instructed, without any side effects, is given a certain antibiotic or another unrelated medication, which in turn triggers a "Drug-to-drug interaction" leading to disorientation, cognitive impairment, impaired reflexes, respiratory depression or an untoward event leading to serious bodily harm or injury, including death.  Patient taking his/her medication for a long time, as instructed, without any side effects, develops an acute impairment of liver and/or kidney function. This will lead to a rapid inability of the body to  breakdown and eliminate their pain medication, which will result in effects similar to an "overdose", but with the same medicine and dose that they had always taken. This again may lead to disorientation, cognitive impairment, impaired reflexes, respiratory depression or an untoward event leading to serious bodily harm or injury, including death.  A similar problem will occur with patients as they grow older and their liver and kidney function begins to decrease as part of the aging process.  Background information: Historically, the original case for using long-term opioid therapy to treat chronic noncancer pain was based on safety assumptions that subsequent experience has called into question. In 1996, the American Pain Society and the [place] [employer] issued a consensus statement supporting long-term opioid therapy. This statement acknowledged the dangers of opioid prescribing but concluded that the risk for addiction was low; respiratory depression induced by opioids was short-lived, occurred mainly in opioid-naive patients, and was antagonized by pain; tolerance was not a common problem; and efforts to control diversion should not constrain opioid prescribing. This has now proven to be wrong. Experience regarding the risks for opioid addiction, misuse, and overdose in community practice has failed to support these assumptions.  According to the Centers for Disease Control and Prevention, fatal overdoses involving opioid analgesics have increased sharply over the past decade. Currently, more than 96,700 people die from drug overdoses every year. Opioids are a factor in 7 out of every 10 overdose deaths. Deaths from drug overdose have surpassed motor vehicle accidents as the leading cause of death for individuals between the ages of 3 and 36.  Clinical data suggest that neuroendocrine dysfunction may be very common in both men and women, potentially causing hypogonadism, erectile  dysfunction, infertility, decreased libido, osteoporosis, and depression. Recent studies linked higher opioid dose to increased opioid-related mortality. Controlled observational studies reported that long-term opioid therapy may be associated with increased risk for cardiovascular events. Subsequent meta-analysis concluded  that the safety of long-term opioid therapy in elderly patients has not been proven.   Side Effects and adverse reactions: Common side effects: Drowsiness (sedation). Dizziness. Nausea and vomiting. Constipation. Physical dependence -- Dependence often manifests with withdrawal symptoms when opioids are discontinued or decreased. Tolerance -- As you take repeated doses of opioids, you require increased medication to experience the same effect of pain relief. Respiratory depression -- This can occur in healthy people, especially with higher doses. However, people with COPD, asthma or other lung conditions may be even more susceptible to fatal respiratory impairment.  Uncommon side effects: An increased sensitivity to feeling pain and extreme response to pain (hyperalgesia). Chronic use of opioids can lead to this. Delayed gastric emptying (the process by which the contents of your stomach are moved into your small intestine). Muscle rigidity. Immune system and hormonal dysfunction. Quick, involuntary muscle jerks (myoclonus). Arrhythmia. Itchy skin (pruritus). Dry mouth (xerostomia).  Long-term side effects: Chronic constipation. Sleep-disordered breathing (SDB). Increased risk of bone fractures. Hypothalamic-pituitary-adrenal dysregulation. Increased risk of overdose.  RISKS: Fractures and Falls:  Opioids increase the risk and incidence of falls. This is of particular importance in elderly patients.  Endocrine System:  Long-term administration is associated with endocrine abnormalities (endocrinopathies). (Also known as Opioid-induced Endocrinopathy) Influences  on both the hypothalamic-pituitary-adrenal axis?and the hypothalamic-pituitary-gonadal axis have been demonstrated with consequent hypogonadism and adrenal insufficiency in both sexes. Hypogonadism and decreased levels of dehydroepiandrosterone sulfate have been reported in men and women. Endocrine effects include: Amenorrhoea in women (abnormal absence of menstruation) Reduced libido in both sexes Decreased sexual function Erectile dysfunction in men Hypogonadisms (decreased testicular function with shrinkage of testicles) Infertility Depression and fatigue Loss of muscle mass Anxiety Depression Immune suppression Hyperalgesia Weight gain Anemia Osteoporosis Patients (particularly women of childbearing age) should avoid opioids. There is insufficient evidence to recommend routine monitoring of asymptomatic patients taking opioids in the long-term for hormonal deficiencies.  Immune System: Human studies have demonstrated that opioids have an immunomodulating effect. These effects are mediated via opioid receptors both on immune effector cells and in the central nervous system. Opioids have been demonstrated to have adverse effects on antimicrobial response and anti-tumour surveillance. Buprenorphine has been demonstrated to have no impact on immune function.  Opioid Induced Hyperalgesia: Human studies have demonstrated that prolonged use of opioids can lead to a state of abnormal pain sensitivity, sometimes called opioid induced hyperalgesia (OIH). Opioid induced hyperalgesia is not usually seen in the absence of tolerance to opioid analgesia. Clinically, hyperalgesia may be diagnosed if the patient on long-term opioid therapy presents with increased pain. This might be qualitatively and anatomically distinct from pain related to disease progression or to breakthrough pain resulting from development of opioid tolerance. Pain associated with hyperalgesia tends to be more diffuse than the  pre-existing pain and less defined in quality. Management of opioid induced hyperalgesia requires opioid dose reduction.  Cancer: Chronic opioid therapy has been associated with an increased risk of cancer among noncancer patients with chronic pain. This association was more evident in chronic strong opioid users. Chronic opioid consumption causes significant pathological changes in the small intestine and colon. Epidemiological studies have found that there is a link between opium dependence and initiation of gastrointestinal cancers. Cancer is the second leading cause of death after cardiovascular disease. Chronic use of opioids can cause multiple conditions such as GERD, immunosuppression and renal damage as well as carcinogenic effects, which are associated with the incidence of cancers.   Mortality: Long-term opioid use   has been associated with increased mortality among patients with chronic non-cancer pain (CNCP).  Prescription of long-acting opioids for chronic noncancer pain was associated with a significantly increased risk of all-cause mortality, including deaths from causes other than overdose.  Reference: Von Korff M, Kolodny A, Deyo RA, Chou R. Long-term opioid therapy reconsidered. Ann Intern Med. 2011 Sep 6;155(5):325-8. doi: 10.7326/[id number]. PMID: [id number]; PMCIDLD:501236. Morley Kos, Hayward RA, Dunn KM, [place] KP. Risk of adverse events in patients prescribed long-term opioids: A cohort study in the [place] Clinical Practice Research Datalink. Eur J Pain. 2019 May;23(5):908-922. doi: 10.1002/ejp.1357. Epub 2019 Jan 31. PMID: [id number]. Colameco S, Coren JS, Ciervo CA. Continuous opioid treatment for chronic noncancer pain: a time for moderation in prescribing. Postgrad Med. 2009 Jul;121(4):61-6. doi: 10.3810/pgm.2009.07.2032. PMID: [id number]. Heywood Bene RN, [place] SD, Blazina I, Rosalio Loud, Bougatsos C, Deyo RA. The  effectiveness and risks of long-term opioid therapy for chronic pain: a systematic review for a [employer] of Health Pathways to [employer]. Ann Intern Med. 2015 Feb 17;162(4):276-86. doi: [id number]. PMID: [id number]. Marjory Sneddon [facility], [place] DM. NCHS Data Brief No. 22. [place]: Centers for Disease Control and Prevention; 2009. Sep, Increase in Fatal Poisonings Involving Opioid Analgesics in the [place], 1999-2006. Song IA, Choi HR, Oh TK. Long-term opioid use and mortality in patients with chronic non-cancer pain: Ten-year follow-up study in [place] from 2010 through 2019. EClinicalMedicine. 2022 Jul 18;51:101558. doi: 10.1016/j.eclinm.2022.YY:5197838. PMID: [id number]; PMCIDYT:2540545. Huser, W., Schubert, T., Vogelmann, T. et al. All-cause mortality in patients with long-term opioid therapy compared with non-opioid analgesics for chronic non-cancer pain: a database study. Flora Med 18, 162 (2020). [email or web address] Rashidian H, Roxy Cedar, Malekzadeh R, Haghdoost AA. An Ecological Study of the Association between Opiate Use and Incidence of Cancers. Addict Health. 2016 Fall;8(4):252-260. PMID: [id number]; PMCIDYE:622990.  Our Goal: Our goal is to control your pain with means other than the use of opioid pain medications.  Our Recommendation: Talk to your physician about coming off of these medications. We can assist you with the tapering down and stopping these medicines. Based on the new information, even if you cannot completely stop the medication, a decrease in the dose may be associated with a lesser risk. Ask for other means of controlling the pain. Decrease or eliminate those factors that significantly contribute to your pain such as smoking, obesity, and a diet heavily tilted towards "inflammatory" nutrients.  Last Updated: 12/25/2022    ____________________________________________________________________________________________     ____________________________________________________________________________________________  National Pain Medication Shortage  The [place] is experiencing worsening drug shortages. These have had a negative widespread effect on patient care and treatment. Not expected to improve any time soon. Predicted to last past 2029.   Drug shortage list (generic names) Oxycodone IR Oxycodone/APAP Oxymorphone IR Hydromorphone Hydrocodone/APAP Morphine  Where is the problem?  Manufacturing and supply level.  Will this shortage affect you?  Only if you take any of the above pain medications.  How? You may be unable to fill your prescription.  Your pharmacist may offer a "partial fill" of your prescription. (Warning: Do not accept partial fills.) Prescriptions partially filled cannot be transferred to another pharmacy. Read our Medication Rules and Regulation. Depending on how much medicine you are dependent on, you may experience withdrawals when unable to get the medication.  Recommendations: Consider ending your dependence on opioid pain medications. Ask your pain specialist to assist you with the process. Consider switching to  a medication currently not in shortage, such as Buprenorphine. Talk to your pain specialist about this option. Consider decreasing your pain medication requirements by managing tolerance thru "Drug Holidays". This may help minimize withdrawals, should you run out of medicine. Control your pain thru the use of non-pharmacological interventional therapies.   Your prescriber: Prescribers cannot be blamed for shortages. Medication manufacturing and supply issues cannot be fixed by the prescriber.   NOTE: The prescriber is not responsible for supplying the medication, or solving supply issues. Work with your pharmacist to solve it. The patient is responsible for the decision  to take or continue taking the medication and for identifying and securing a legal supply source. By law, supplying the medication is the job and responsibility of the pharmacy. The prescriber is responsible for the evaluation, monitoring, and prescribing of these medications.   Prescribers will NOT: Re-issue prescriptions that have been partially filled. Re-issue prescriptions already sent to a pharmacy.  Re-send prescriptions to a different pharmacy because yours did not have your medication. Ask pharmacist to order more medicine or transfer the prescription to another pharmacy. (Read below.)  New 2023 regulation: "June 28, 2022 Revised Regulation Allows DEA-Registered Pharmacies to Transfer Electronic Prescriptions at a Patient's Request [place] Patients now have the ability to request their electronic prescription be transferred to another pharmacy without having to go back to their practitioner to initiate the request. This revised regulation went into effect on Monday, June 24, 2022.     At a patient's request, a DEA-registered retail pharmacy can now transfer an electronic prescription for a controlled substance (schedules II-V) to another DEA-registered retail pharmacy. Prior to this change, patients would have to go through their practitioner to cancel their prescription and have it re-issued to a different pharmacy. The process was taxing and time consuming for both patients and practitioners.    The Drug Enforcement Administration [facility]) published its intent to revise the process for transferring electronic prescriptions on September 15, 2020.  The final rule was published in the federal register on May 23, 2022 and went into effect 30 days later.  Under the final rule, a prescription can only be transferred once between pharmacies, and only if allowed under existing state or other applicable law. The prescription must remain in its  electronic form; may not be altered in any way; and the transfer must be communicated directly between two licensed pharmacists. It's important to note, any authorized refills transfer with the original prescription, which means the entire prescription will be filled at the same pharmacy".  Reference: [email or web address] [facility] website announcement)  [email or web address] ([place])   [employer] / Vol. 88, No. 143 / Thursday, May 23, 2022 / Rules and Regulations DEPARTMENT OF JUSTICE  Drug Enforcement Administration  21 CFR Part 1306  [Docket No. [id number]]  RIN [id number] Transfer of Electronic Prescriptions for Schedules II-V Controlled Substances Between Pharmacies for Initial Filling  ____________________________________________________________________________________________     ____________________________________________________________________________________________  Transfer of Pain Medication between Pharmacies  Re: 2023 DEA Clarification on existing regulation  Published on DEA Website: June 28, 2022  Title: Revised Regulation Allows DEA-Registered Pharmacies to Conservator, museum/gallery Prescriptions at a Patient's Request [place]  "Patients now have the ability to request their electronic prescription be transferred to another pharmacy without having to go back to their practitioner to initiate the request. This revised regulation went into effect on Monday, June 24, 2022.  At a patient's request, a DEA-registered retail pharmacy can now transfer an electronic prescription for a controlled substance (schedules II-V) to another DEA-registered retail pharmacy. Prior to this change, patients would have to go through their practitioner to cancel their  prescription and have it re-issued to a different pharmacy. The process was taxing and time consuming for both patients and practitioners.    The Drug Enforcement Administration [facility]) published its intent to revise the process for transferring electronic prescriptions on September 15, 2020.  The final rule was published in the federal register on May 23, 2022 and went into effect 30 days later.  Under the final rule, a prescription can only be transferred once between pharmacies, and only if allowed under existing state or other applicable law. The prescription must remain in its electronic form; may not be altered in any way; and the transfer must be communicated directly between two licensed pharmacists. It's important to note, any authorized refills transfer with the original prescription, which means the entire prescription will be filled at the same pharmacy."    REFERENCES: 1. DEA website announcement [email or web address]  2. Department of Justice website  [email or web address]  3. DEPARTMENT OF JUSTICE Drug Enforcement Administration 21 CFR Part 1306 [Docket No. [id number]] RIN [id number] "Transfer of Electronic Prescriptions for Schedules II-V Controlled Substances Between Pharmacies for Initial Filling"  ____________________________________________________________________________________________     _______________________________________________________________________  Medication Rules  Purpose: To inform patients, and their family members, of our medication rules and regulations.  Applies to: All patients receiving prescriptions from our practice (written or electronic).  Pharmacy of record: This is the pharmacy where your electronic prescriptions will be sent. Make sure we have the correct one.  Electronic prescriptions: In compliance  with the [place] (STOP) Act of 2017 (Session Lanny Cramp [phone]), effective October 28, 2018, all controlled substances must be electronically prescribed. Written prescriptions, faxing, or calling prescriptions to a pharmacy will no longer be done.  Prescription refills: These will be provided only during in-person appointments. No medications will be renewed without a "face-to-face" evaluation with your provider. Applies to all prescriptions.  NOTE: The following applies primarily to controlled substances (Opioid* Pain Medications).   Type of encounter (visit): For patients receiving controlled substances, face-to-face visits are required. (Not an option and not up to the patient.)  Patient's responsibilities: Pain Pills: Bring all pain pills to every appointment (except for procedure appointments). Pill Bottles: Bring pills in original pharmacy bottle. Bring bottle, even if empty. Always bring the bottle of the most recent fill.  Medication refills: You are responsible for knowing and keeping track of what medications you are taking and when is it that you will need a refill. The day before your appointment: write a list of all prescriptions that need to be refilled. The day of the appointment: give the list to the admitting nurse. Prescriptions will be written only during appointments. No prescriptions will be written on procedure days. If you forget a medication: it will not be "Called in", "Faxed", or "electronically sent". You will need to get another appointment to get these prescribed. No early refills. Do not call asking to have your prescription filled early. Partial  or short prescriptions: Occasionally your pharmacy may not have enough pills to fill your prescription.  NEVER ACCEPT a partial fill or a prescription that is short of the total amount of pills that you were prescribed.  With controlled substances the law allows 72 hours for the pharmacy  to complete the  prescription.  If the prescription is not completed within 72 hours, the pharmacist will require a new prescription to be written. This means that you will be short on your medicine and we WILL NOT send another prescription to complete your original prescription.  Instead, request the pharmacy to send a carrier to a nearby branch to get enough medication to provide you with your full prescription. Prescription Accuracy: You are responsible for carefully inspecting your prescriptions before leaving our office. Have the discharge nurse carefully go over each prescription with you, before taking them home. Make sure that your name is accurately spelled, that your address is correct. Check the name and dose of your medication to make sure it is accurate. Check the number of pills, and the written instructions to make sure they are clear and accurate. Make sure that you are given enough medication to last until your next medication refill appointment. Taking Medication: Take medication as prescribed. When it comes to controlled substances, taking less pills or less frequently than prescribed is permitted and encouraged. Never take more pills than instructed. Never take the medication more frequently than prescribed.  Inform other Doctors: Always inform, all of your healthcare providers, of all the medications you take. Pain Medication from other Providers: You are not allowed to accept any additional pain medication from any other Doctor or Healthcare provider. There are two exceptions to this rule. (see below) In the event that you require additional pain medication, you are responsible for notifying us, as stated below. Cough Medicine: Often these contain an opioid, such as codeine or hydrocodone. Never accept or take cough medicine containing these opioids if you are already taking an opioid* medication. The combination may cause respiratory failure and death. Medication Agreement: You are  responsible for carefully reading and following our Medication Agreement. This must be signed before receiving any prescriptions from our practice. Safely store a copy of your signed Agreement. Violations to the Agreement will result in no further prescriptions. (Additional copies of our Medication Agreement are available upon request.) Laws, Rules, & Regulations: All patients are expected to follow all Federal and [employer], [employer], Rules, [employer]. Ignorance of the Laws does not constitute a valid excuse.  Illegal drugs and Controlled Substances: The use of illegal substances (including, but not limited to marijuana and its derivatives) and/or the illegal use of any controlled substances is strictly prohibited. Violation of this rule may result in the immediate and permanent discontinuation of any and all prescriptions being written by our practice. The use of any illegal substances is prohibited. Adopted CDC guidelines & recommendations: Target dosing levels will be at or below 60 MME/day. Use of benzodiazepines** is not recommended.  Exceptions: There are only two exceptions to the rule of not receiving pain medications from other Healthcare Providers. Exception #1 (Emergencies): In the event of an emergency (i.e.: accident requiring emergency care), you are allowed to receive additional pain medication. However, you are responsible for: As soon as you are able, call our office [phone], at any time of the day or night, and leave a message stating your name, the date and nature of the emergency, and the name and dose of the medication prescribed. In the event that your call is answered by a member of our staff, make sure to document and save the date, time, and the name of the person that took your information.  Exception #2 (Planned Surgery): In the event that you are scheduled by another doctor or dentist to  have any type of surgery or procedure, you are allowed (for a period no longer  than 30 days), to receive additional pain medication, for the acute post-op pain. However, in this case, you are responsible for picking up a copy of our "Post-op Pain Management for Surgeons" handout, and giving it to your surgeon or dentist. This document is available at our office, and does not require an appointment to obtain it. Simply go to our office during business hours (Monday-Thursday from 8:00 AM to 4:00 PM) (Friday 8:00 AM to 12:00 Noon) or if you have a scheduled appointment with [place], prior to your surgery, and ask for it by name. In addition, you are responsible for: calling our office [phone], at any time of the day or night, and leaving a message stating your name, name of your surgeon, type of surgery, and date of procedure or surgery. Failure to comply with your responsibilities may result in termination of therapy involving the controlled substances. Medication Agreement Violation. Following the above rules, including your responsibilities will help you in avoiding a Medication Agreement Violation ("Breaking your Pain Medication Contract").  Consequences:  Not following the above rules may result in permanent discontinuation of medication prescription therapy.  *Opioid medications include: morphine, codeine, oxycodone, oxymorphone, hydrocodone, hydromorphone, meperidine, tramadol, tapentadol, buprenorphine, fentanyl, methadone. **Benzodiazepine medications include: diazepam (Valium), alprazolam (Xanax), clonazepam (Klonopine), lorazepam (Ativan), clorazepate (Tranxene), chlordiazepoxide (Librium), estazolam (Prosom), oxazepam (Serax), temazepam (Restoril), triazolam (Halcion) (Last updated: 08/20/2022) ______________________________________________________________________    ______________________________________________________________________  Medication Recommendations and Reminders  Applies to: All patients receiving prescriptions (written and/or  electronic).  Medication Rules & Regulations: You are responsible for reading, knowing, and following our "Medication Rules" document. These exist for your safety and that of others. They are not flexible and neither are we. Dismissing or ignoring them is an act of "non-compliance" that may result in complete and irreversible termination of such medication therapy. For safety reasons, "non-compliance" will not be tolerated. As with the [place] fundamental legal principle of "ignorance of the law is no defense", we will accept no excuses for not having read and knowing the content of documents provided to you by our practice.  Pharmacy of record:  Definition: This is the pharmacy where your electronic prescriptions will be sent.  We do not endorse any particular pharmacy. It is up to you and your insurance to decide what pharmacy to use.  We do not restrict you in your choice of pharmacy. However, once we write for your prescriptions, we will NOT be re-sending more prescriptions to fix restricted supply problems created by your pharmacy, or your insurance.  The pharmacy listed in the electronic medical record should be the one where you want electronic prescriptions to be sent. If you choose to change pharmacy, simply notify our nursing staff. Changes will be made only during your regular appointments and not over the phone.  Recommendations: Keep all of your pain medications in a safe place, under lock and key, even if you live alone. We will NOT replace lost, stolen, or damaged medication. We do not accept "Police Reports" as proof of medications having been stolen. After you fill your prescription, take 1 week's worth of pills and put them away in a safe place. You should keep a separate, properly labeled bottle for this purpose. The remainder should be kept in the original bottle. Use this as your primary supply, until it runs out. Once it's gone, then you know that you have 1 week's worth of medicine,  and it is time to come in for a prescription refill. If you do this correctly, it is unlikely that you will ever run out of medicine. To make sure that the above recommendation works, it is very important that you make sure your medication refill appointments are scheduled at least 1 week before you run out of medicine. To do this in an effective manner, make sure that you do not leave the office without scheduling your next medication management appointment. Always ask the nursing staff to show you in your prescription , when your medication will be running out. Then arrange for the receptionist to get you a return appointment, at least 7 days before you run out of medicine. Do not wait until you have 1 or 2 pills left, to come in. This is very poor planning and does not take into consideration that we may need to cancel appointments due to bad weather, sickness, or emergencies affecting our staff. DO NOT ACCEPT A "Partial Fill": If for any reason your pharmacy does not have enough pills/tablets to completely fill or refill your prescription, do not allow for a "partial fill". The law allows the pharmacy to complete that prescription within 72 hours, without requiring a new prescription. If they do not fill the rest of your prescription within those 72 hours, you will need a separate prescription to fill the remaining amount, which we will NOT provide. If the reason for the partial fill is your insurance, you will need to talk to the pharmacist about payment alternatives for the remaining tablets, but again, DO NOT ACCEPT A PARTIAL FILL, unless you can trust your pharmacist to obtain the remainder of the pills within 72 hours.  Prescription refills and/or changes in medication(s):  Prescription refills, and/or changes in dose or medication, will be conducted only during scheduled medication management appointments. (Applies to both, written and electronic prescriptions.) No refills on procedure days. No  medication will be changed or started on procedure days. No changes, adjustments, and/or refills will be conducted on a procedure day. Doing so will interfere with the diagnostic portion of the procedure. No phone refills. No medications will be "called into the pharmacy". No Fax refills. No weekend refills. No Holliday refills. No after hours refills.  Remember:  Business hours are:  Monday to Thursday 8:00 AM to 4:00 PM Provider's Schedule: Milinda Pointer, MD - Appointments are:  Medication management: Monday and Wednesday 8:00 AM to 4:00 PM Procedure day: Tuesday and Thursday 7:30 AM to 4:00 PM Gillis Santa, MD - Appointments are:  Medication management: Tuesday and Thursday 8:00 AM to 4:00 PM Procedure day: Monday and Wednesday 7:30 AM to 4:00 PM (Last update: 08/20/2022) ______________________________________________________________________    ____________________________________________________________________________________________  Drug Holidays  What is a "Drug Holiday"? Drug Holiday: is the name given to the process of slowly tapering down and temporarily stopping the pain medication for the purpose of decreasing or eliminating tolerance to the drug.  Benefits Improved effectiveness Decreased required effective dose Improved pain control End dependence on high dose therapy Decrease cost of therapy Uncovering "opioid-induced hyperalgesia". (OIH)  What is "opioid hyperalgesia"? It is a paradoxical increase in pain caused by exposure to opioids. Stopping the opioid pain medication, contrary to the expected, it actually decreases or completely eliminates the pain. Ref.: "A comprehensive review of opioid-induced hyperalgesia". Brion Aliment, et.al. Pain Physician. 2011 Mar-Apr;14(2):145-61.  What is tolerance? Tolerance: the progressive loss of effectiveness of a pain medicine due to repetitive use. A common problem of opioid  pain medications.  How long should a "Drug  Holiday" last? Effectiveness depends on the patient staying off all opioid pain medicines for a minimum of 14 consecutive days. (2 weeks)  How about just taking less of the medicine? Does not work. Will not accomplish goal of eliminating the excess receptors.  How about switching to a different pain medicine? (AKA. "Opioid rotation") Does not work. Creates the illusion of effectiveness by taking advantage of inaccurate equivalent dose calculations between different opioids. -This "technique" was promoted by studies funded by [employer], such as [employer], creators of "OxyContin".  Can I stop the medicine "cold [place]"? We do not recommend it. You should always coordinate with your prescribing physician to make the transition as smoothly as possible. Avoid stopping the medicine abruptly without consulting. We recommend a "slow taper".  What is a slow taper? Taper: refers to the gradual decrease in dose.   How do I stop/taper the dose? Slowly. Decrease the daily amount of pills that you take by one (1) pill every seven (7) days. This is called a "slow downward taper". Example: if you normally take four (4) pills per day, drop it to three (3) pills per day for seven (7) days, then to two (2) pills per day for seven (7) days, then to one (1) per day for seven (7) days, and then stop the medicine. The 14 day "Drug Holiday" starts on the first day without medicine.   Will I experience withdrawals? Unlikely with a slow taper.  What triggers withdrawals? Withdrawals are triggered by the sudden/abrupt stop of high dose opioids. Withdrawals can be avoided by slowly decreasing the dose over a prolonged period of time.  What are withdrawals? Symptoms associated with sudden/abrupt reduction/stopping of high-dose, long-term use of pain medication. Withdrawal are seldom seen on low dose therapy, or patients rarely taking opioid medication.  Early Withdrawal Symptoms may  include: Agitation Anxiety Muscle aches Increased tearing Insomnia Runny nose Sweating Yawning  Late symptoms may include: Abdominal cramping Diarrhea Dilated pupils Goose bumps Nausea Vomiting  When could I see withdrawals? Onset: 8-24 hours after last use for most opioids. 12-48 hours for long-acting opioids (i.e.: methadone)  How long could they last? Duration: 4-10 days for most opioids. 14-21 days for long-acting opioids (i.e.: methadone)  What will happen after I complete my "Drug Holiday"? The need and indications for the opioid analgesic will be reviewed before restarting the medication. Dose requirements will likely decrease and the dose will need to be adjusted accordingly.   (Last update: 01/15/2023) ____________________________________________________________________________________________    ____________________________________________________________________________________________  WARNING: CBD (cannabidiol) & Delta (Delta-8 tetrahydrocannabinol) products.   Applicable to:  All individuals currently taking or considering taking CBD (cannabidiol) and, more important, all patients taking opioid analgesic controlled substances (pain medication). (Example: oxycodone; oxymorphone; hydrocodone; hydromorphone; morphine; methadone; tramadol; tapentadol; fentanyl; buprenorphine; butorphanol; dextromethorphan; meperidine; codeine; etc.)  Introduction:  Recently there has been a drive towards the use of "natural" products for the treatment of different conditions, including pain anxiety and sleep disorders. Marijuana and hemp are two varieties of the cannabis genus plants. Marijuana and its derivatives are illegal, while hemp and its derivatives are not. Cannabidiol (CBD) and tetrahydrocannabinol (THC), are two natural compounds found in plants of the Cannabis genus. They can both be extracted from hemp or marijuana. Both compounds interact with your body's endocannabinoid  system in very different ways. CBD is associated with pain relief (analgesia) while THC is associated with the psychoactive effects ("the high") obtained from  the use of marijuana products. There are two main types of THC: Delta-9, which comes from the marijuana plant and it is illegal, and Delta-8, which comes from the hemp plant, and it is legal. (Both, Delta-9-THC and Delta-8-THC are psychoactive and give you "the high".)   Legality:  Marijuana and its derivatives: illegal Hemp and its derivatives: Legal (State dependent) UPDATE: (12/14/2021) The Drug Enforcement Agency ([place]) issued a letter stating that "delta" cannabinoids, including Delta-8-THCO and Delta-9-THCO, synthetically derived from hemp do not qualify as hemp and will be viewed as Schedule I drugs. (Schedule I drugs, substances, or chemicals are defined as drugs with no currently accepted medical use and a high potential for abuse. Some examples of Schedule I drugs are: heroin, lysergic acid diethylamide (LSD), marijuana (cannabis), 3,4-methylenedioxymethamphetamine (ecstasy), methaqualone, and peyote.) (https://jennings.com/)  Legal status of CBD in [place]:  "Conditionally Legal"  Reference: "FDA Regulation of Cannabis and Cannabis-Derived Products, Including Cannabidiol (CBD)" - SeekArtists.com.pt  Warning:  CBD is not FDA approved and has not undergo the same manufacturing controls as prescription drugs.  This means that the purity and safety of available CBD may be questionable. Most of the time, despite manufacturer's claims, it is contaminated with THC (delta-9-tetrahydrocannabinol - the chemical in marijuana responsible for the "HIGH").  When this is the case, the [facility] contaminant will trigger a positive urine drug screen (UDS) test for Marijuana (carboxy-THC).   The FDA recently put out a warning about 5 things that everyone  should be aware of regarding Delta-8 THC: Delta-8 THC products have not been evaluated or approved by the FDA for safe use and may be marketed in ways that put the public health at risk. The FDA has received adverse event reports involving delta-8 THC-containing products. Delta-8 THC has psychoactive and intoxicating effects. Delta-8 THC manufacturing often involve use of potentially harmful chemicals to create the concentrations of delta-8 THC claimed in the marketplace. The final delta-8 THC product may have potentially harmful by-products (contaminants) due to the chemicals used in the process. Manufacturing of delta-8 THC products may occur in uncontrolled or unsanitary settings, which may lead to the presence of unsafe contaminants or other potentially harmful substances. Delta-8 THC products should be kept out of the reach of children and pets.  NOTE: Because a positive UDS for any illicit substance is a violation of our medication agreement, your opioid analgesics (pain medicine) may be permanently discontinued.  MORE ABOUT CBD  General Information: CBD was discovered in [birth year] and it is a derivative of the cannabis sativa genus plants (Marijuana and Hemp). It is one of the 113 identified substances found in Marijuana. It accounts for up to 40% of the plant's extract. As of 2018, preliminary clinical studies on CBD included research for the treatment of anxiety, movement disorders, and pain. CBD is available and consumed in multiple forms, including inhalation of smoke or vapor, as an aerosol spray, and by mouth. It may be supplied as an oil containing CBD, capsules, dried cannabis, or as a liquid solution. CBD is thought not to be as psychoactive as THC (delta-9-tetrahydrocannabinol - the chemical in marijuana responsible for the "HIGH"). Studies suggest that CBD may interact with different biological target receptors in the body, including cannabinoid and other neurotransmitter receptors. As of  2018 the mechanism of action for its biological effects has not been determined.  Side-effects  Adverse reactions: Dry mouth, diarrhea, decreased appetite, fatigue, drowsiness, malaise, weakness, sleep disturbances, and others.  Drug interactions:  CBD may interact with medications  such as blood-thinners. CBD causes drowsiness on its own and it will increase drowsiness caused by other medications, including antihistamines (such as Benadryl), benzodiazepines (Xanax, Ativan, Valium), antipsychotics, antidepressants, opioids, alcohol and supplements such as kava, melatonin and St. John's Wort.  Other drug interactions: Brivaracetam (Briviact); Caffeine; Carbamazepine (Tegretol); Citalopram (Celexa); Clobazam (Onfi); Eslicarbazepine (Aptiom); Everolimus (Zostress); Lithium; Methadone (Dolophine); Rufinamide (Banzel); Sedative medications (CNS depressants); Sirolimus (Rapamune); Stiripentol (Diacomit); Tacrolimus (Prograf); Tamoxifen ; Soltamox); Topiramate (Topamax); Valproate; Warfarin (Coumadin); Zonisamide. (Last update: 10/07/2022) ____________________________________________________________________________________________   ____________________________________________________________________________________________  Naloxone Nasal Spray  Why am I receiving this medication? [place] STOP ACT requires that all patients taking high dose opioids or at risk of opioids respiratory depression, be prescribed an opioid reversal agent, such as Naloxone (AKA: Narcan).  What is this medication? NALOXONE (nal OX one) treats opioid overdose, which causes slow or shallow breathing, severe drowsiness, or trouble staying awake. Call emergency services after using this medication. You may need additional treatment. Naloxone works by reversing the effects of opioids. It belongs to a group of medications called opioid blockers.  COMMON BRAND NAME(S): Kloxxado, Narcan  What should I tell my care team before  I take this medication? They need to know if you have any of these conditions: Heart disease Substance use disorder An unusual or allergic reaction to naloxone, other medications, foods, dyes, or preservatives Pregnant or trying to get pregnant Breast-feeding  When to use this medication? This medication is to be used for the treatment of respiratory depression (less than 8 breaths per minute) secondary to opioid overdose.   How to use this medication? This medication is for use in the nose. Lay the person on their back. Support their neck with your hand and allow the head to tilt back before giving the medication. The nasal spray should be given into 1 nostril. After giving the medication, move the person onto their side. Do not remove or test the nasal spray until ready to use. Get emergency medical help right away after giving the first dose of this medication, even if the person wakes up. You should be familiar with how to recognize the signs and symptoms of a narcotic overdose. If more doses are needed, give the additional dose in the other nostril. Talk to your care team about the use of this medication in children. While this medication may be prescribed for children as young as newborns for selected conditions, precautions do apply.  Naloxone Overdosage: If you think you have taken too much of this medicine contact a poison control center or emergency room at once.  NOTE: This medicine is only for you. Do not share this medicine with others.  What if I miss a dose? This does not apply.  What may interact with this medication? This is only used during an emergency. No interactions are expected during emergency use. This list may not describe all possible interactions. Give your health care provider a list of all the medicines, herbs, non-prescription drugs, or dietary supplements you use. Also tell them if you smoke, drink alcohol, or use illegal drugs. Some items may interact with  your medicine.  What should I watch for while using this medication? Keep this medication ready for use in the case of an opioid overdose. Make sure that you have the phone number of your care team and local hospital ready. You may need to have additional doses of this medication. Each nasal spray contains a single dose. Some emergencies may require additional doses. After use, bring  the treated person to the nearest hospital or call 911. Make sure the treating care team knows that the person has received a dose of this medication. You will receive additional instructions on what to do during and after use of this medication before an emergency occurs.  What side effects may I notice from receiving this medication? Side effects that you should report to your care team as soon as possible: Allergic reactions--skin rash, itching, hives, swelling of the face, lips, tongue, or throat Side effects that usually do not require medical attention (report these to your care team if they continue or are bothersome): Constipation Dryness or irritation inside the nose Headache Increase in blood pressure Muscle spasms Stuffy nose Toothache This list may not describe all possible side effects. Call your doctor for medical advice about side effects. You may report side effects to FDA at 1-800-FDA-1088.  Where should I keep my medication? Because this is an emergency medication, you should keep it with you at all times.  Keep out of the reach of children and pets. Store between 20 and 25 degrees C (68 and 77 degrees F). Do not freeze. Throw away any unused medication after the expiration date. Keep in original box until ready to use.  NOTE: This sheet is a summary. It may not cover all possible information. If you have questions about this medicine, talk to your doctor, pharmacist, or health care provider.   2023 Elsevier/Gold Standard (2021-06-22  00:00:00)  ____________________________________________________________________________________________

## 2023-03-04 ENCOUNTER — Encounter: Payer: Medicare PPO | Admitting: Student in an Organized Health Care Education/Training Program

## 2023-03-24 ENCOUNTER — Other Ambulatory Visit: Payer: Self-pay | Admitting: Gastroenterology

## 2023-03-24 DIAGNOSIS — K5904 Chronic idiopathic constipation: Secondary | ICD-10-CM

## 2023-05-05 ENCOUNTER — Telehealth: Payer: Self-pay

## 2023-05-05 NOTE — Telephone Encounter (Signed)
Patient states she is having right epigastric pain under her breast. She states the pain has been going on for several months now and is off and on. It is worse sometimes when she eats and other times not. It not a specific kind of food that bothers her. She states the pain is a aching, throbbing, sharp and burning like pain. She has had constipation and just went 9 days with out a bowel movement. She states on Wednesday took 4 Laxatives tablets. She had a little bowel movement on Thursday but not since. She does take Oxycodone daily.   Last appointment with Dr. Allegra Lai was 02/12/2022  No showed appointment on 06/24/2022 Has appointment schedule 07/16/2023 with Dr. Allegra Lai.   Please advise what you recommend and if you can see her at your next available appointment

## 2023-05-05 NOTE — Telephone Encounter (Signed)
Appointment Request From: Leandra Kern   With Provider: Lannette Donath, MD [facility] [place]]   Preferred Date Range: Any date 05/07/2023 or later   Preferred Times: Any Time   Reason for visit: Office Visit   Comments: Pain in upper right part of chest. Bloating,   Patient sent a mychart message requesting a appointment. Called patient and left a message for call back to find out more information of symptoms

## 2023-05-05 NOTE — Telephone Encounter (Signed)
Made appointment for 05/12/2023

## 2023-05-05 NOTE — Telephone Encounter (Signed)
Called and left a message for call back  

## 2023-05-07 ENCOUNTER — Ambulatory Visit: Payer: Medicare PPO | Attending: Pain Medicine | Admitting: Pain Medicine

## 2023-05-07 ENCOUNTER — Encounter: Payer: Self-pay | Admitting: Pain Medicine

## 2023-05-07 VITALS — BP 139/79 | HR 99 | Temp 98.1°F | Resp 16 | Ht 61.0 in | Wt 163.0 lb

## 2023-05-07 DIAGNOSIS — M542 Cervicalgia: Secondary | ICD-10-CM | POA: Diagnosis present

## 2023-05-07 DIAGNOSIS — Z79899 Other long term (current) drug therapy: Secondary | ICD-10-CM | POA: Insufficient documentation

## 2023-05-07 DIAGNOSIS — G894 Chronic pain syndrome: Secondary | ICD-10-CM | POA: Diagnosis present

## 2023-05-07 DIAGNOSIS — M5442 Lumbago with sciatica, left side: Secondary | ICD-10-CM | POA: Insufficient documentation

## 2023-05-07 DIAGNOSIS — T85848S Pain due to other internal prosthetic devices, implants and grafts, sequela: Secondary | ICD-10-CM | POA: Insufficient documentation

## 2023-05-07 DIAGNOSIS — M79605 Pain in left leg: Secondary | ICD-10-CM | POA: Diagnosis present

## 2023-05-07 DIAGNOSIS — T402X5A Adverse effect of other opioids, initial encounter: Secondary | ICD-10-CM | POA: Diagnosis present

## 2023-05-07 DIAGNOSIS — M47816 Spondylosis without myelopathy or radiculopathy, lumbar region: Secondary | ICD-10-CM | POA: Insufficient documentation

## 2023-05-07 DIAGNOSIS — M5481 Occipital neuralgia: Secondary | ICD-10-CM | POA: Insufficient documentation

## 2023-05-07 DIAGNOSIS — M5441 Lumbago with sciatica, right side: Secondary | ICD-10-CM | POA: Diagnosis present

## 2023-05-07 DIAGNOSIS — Z9682 Presence of neurostimulator: Secondary | ICD-10-CM | POA: Diagnosis present

## 2023-05-07 DIAGNOSIS — K5903 Drug induced constipation: Secondary | ICD-10-CM | POA: Diagnosis present

## 2023-05-07 DIAGNOSIS — G8929 Other chronic pain: Secondary | ICD-10-CM | POA: Diagnosis present

## 2023-05-07 DIAGNOSIS — M25551 Pain in right hip: Secondary | ICD-10-CM | POA: Insufficient documentation

## 2023-05-07 DIAGNOSIS — Z79891 Long term (current) use of opiate analgesic: Secondary | ICD-10-CM | POA: Insufficient documentation

## 2023-05-07 DIAGNOSIS — M25552 Pain in left hip: Secondary | ICD-10-CM | POA: Insufficient documentation

## 2023-05-07 DIAGNOSIS — M79604 Pain in right leg: Secondary | ICD-10-CM | POA: Insufficient documentation

## 2023-05-07 DIAGNOSIS — T85192S Other mechanical complication of implanted electronic neurostimulator (electrode) of spinal cord, sequela: Secondary | ICD-10-CM | POA: Diagnosis present

## 2023-05-07 MED ORDER — OXYCODONE HCL 5 MG PO TABS
5.0000 mg | ORAL_TABLET | Freq: Two times a day (BID) | ORAL | 0 refills | Status: AC | PRN
Start: 2023-07-08 — End: 2023-08-07

## 2023-05-07 MED ORDER — LUBIPROSTONE 24 MCG PO CAPS
24.0000 ug | ORAL_CAPSULE | Freq: Two times a day (BID) | ORAL | 0 refills | Status: DC
Start: 2023-05-07 — End: 2023-06-25

## 2023-05-07 MED ORDER — OXYCODONE HCL 5 MG PO TABS: 5.0000 mg | ORAL_TABLET | Freq: Two times a day (BID) | ORAL | 0 refills | Status: DC | PRN

## 2023-05-07 NOTE — Progress Notes (Signed)
PROVIDER NOTE: Information contained herein reflects review and annotations entered in association with encounter. Interpretation of such information and data should be left to medically-trained personnel. Information provided to patient can be located elsewhere in the medical record under "Patient Instructions". Document created using STT-dictation technology, any transcriptional errors that may result from process are unintentional.    Patient: Karen Edwards  Service Category: E/M  Provider: Oswaldo Done, MD  DOB: 1966-02-11  DOS: 05/07/2023  Referring Provider: Doreene Nest, NP  MRN: [id number]  Specialty: Interventional Pain Management  PCP: Doreene Nest, NP  Type: Established Patient  Setting: Ambulatory outpatient    Location: Office  Delivery: Face-to-face     HPI  [name], a 57 y.o. year old female, is here today because of her Chronic pain syndrome [G89.4]. Ms. Don primary complain today is Back Pain (lower)  Pertinent problems: Karen Edwards has Chronic low back pain (1ry area of Pain) (Bilateral) (R>L) w/ sciatica (Bilateral); DDD (degenerative disc disease), lumbar; DDD (degenerative disc disease), cervical; Chronic occipital neuralgia (5th area of Pain) (Bilateral) (R>L); Lumbar facet syndrome (Bilateral) (L>R); DDD (degenerative disc disease), lumbosacral; Lumbar radicular pain (Right) (L4); Sacroiliac joint dysfunction (Bilateral); Chronic neck pain (4th area of Pain) (Bilateral) (R>L); Numbness and tingling of upper extremity (Right); Chronic upper extremity pain (Bilateral) (R>L); Chronic sacroiliac joint pain (Bilateral) (L>R); Lumbar facet hypertrophy (Bilateral); L1-2 disc extrusion (Right); Cervical foraminal stenosis (C5-6) (Bilateral); Chronic pain syndrome; Chronic cervical radicular pain (Right: C6/C7) (Left: C5/T1); Chronic lumbar radicular pain; Chronic hip pain (3ry area of Pain) (Bilateral) (R>L); Chronic lower extremity pain (2ry area of  Pain) (Bilateral) (R>L); Chronic shoulder pain (Bilateral) (L>R); Chronic musculoskeletal pain; Lumbar spondylosis; Cervical facet syndrome (Bilateral) (R>L); Myofascial pain; Spondylosis without myelopathy or radiculopathy, cervical region; Cervicalgia; S/P insertion of Epidural Neurostimulator (SCS) (Bilateral); Chronic neck pain with history of cervical spinal surgery; 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[facility]); Trigger point with back pain (Right); Cervicogenic headache (Bilateral); Occipital headache (Bilateral); Chronic tension-type headache, intractable; Cervico-occipital neuralgia (Bilateral); Pain due to any device, implant or graft (SCS battery) (Left PSIS area); Spondylosis without myelopathy or radiculopathy, lumbosacral region; History of cervical spinal surgery (C5-7 ACDF); Cervical radiculopathy at C6 (Bilateral); Chronic low back pain (Bilateral) w/o sciatica; Presence of neurostimulator (Thoracolumbar); Spinal cord stimulator dysfunction (HCC); Neurogenic pain; Chronic low back pain (Left) w/o sciatica; Cluneal neuropathy (Left); Abnormal MRI, lumbar spine (04/17/2021); Enthesopathy of hip region (Bilateral); Protrusion of lumbar intervertebral disc (Right: L1-2, L3-4); 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 Pain Assessment: Severity of Chronic pain is reported as a 7 /10. Location: Back Lower/both upper legs. Onset: More than a month ago. Quality: Aching, Burning, Throbbing, Stabbing. Timing: Constant. Modifying factor(s): positioning. Vitals:  height is 5\' 1"  (1.549 m) and weight is 163 lb (73.9 kg). Her temporal temperature is 98.1 F (36.7 C). Her blood pressure is 139/79 and her pulse is 99. Her respiration is 16 and oxygen saturation is 99%.  BMI:  Estimated body mass index is 30.8 kg/m as calculated from the following:   Height as of this encounter: 5\' 1"  (1.549 m).   Weight as of this encounter: 163 lb (73.9 kg). Last encounter: 01/28/2023. Last procedure: 01/14/2023.  Reason for encounter: medication management.  The patient indicates doing well with the current medication regimen. No  adverse reactions or side effects reported to the medications except for more severe constipation.  She indicates having an appointment to see a gastroenterologist since she spent 3 weeks without having had a bowel movement.  She is really not taking that much pain medication, but it seems to be affecting her considerably.  For this reason today I will be sending a prescription for Amitiza to the pharmacy to see if that helps.  The patient has been provided with written information with regards to this medication.  The only allergy that I have on file for this patient is that of gabapentin.  The patient indicates her spinal cord stimulator not to be holding a charge and not really helping with her pain in addition, it is still causing some discomfort around the battery area in the left PSIS region.  She has requested the implant to be removed.  Will go ahead and schedule that as soon as possible.  RTCB: 08/07/2023   Pharmacotherapy Assessment  Analgesic: Oxycodone IR 5 mg, 1 tab PO QD (5 mg/day of oxycodone) PRN MME/day: 7.5 mg/day.   Monitoring: [employer] PMP: PDMP reviewed during this encounter.       Pharmacotherapy: No side-effects or adverse reactions reported. Compliance: No problems identified. Effectiveness: Clinically acceptable.  Karen Elk, RN  05/07/2023  1:32 PM  Sign when Signing Visit Nursing Pain Medication Assessment:  Safety precautions to be maintained throughout the outpatient stay will include: orient to surroundings, keep bed in low position, maintain call bell within reach at all times, provide assistance with transfer out of bed and  ambulation.  Medication Inspection Compliance: Pill count conducted under aseptic conditions, in front of the patient. Neither the pills nor the bottle was removed from the patient's sight at any time. Once count was completed pills were immediately returned to the patient in their original bottle.  Medication: Oxycodone IR Pill/Patch Count:  16 of 20 pills remain Pill/Patch Appearance: Markings consistent with prescribed medication Bottle Appearance: Standard pharmacy container. Clearly labeled. Filled Date: 07 / 08 / 2024 Last Medication intake:  Yesterday    No results found for: "CBDTHCR" No results found for: "D8THCCBX" No results found for: "D9THCCBX"  UDS:  Summary  Date Value Ref Range Status  09/11/2022 Note  Final    Comment:    ==================================================================== [employer] Select 13 (MW) ==================================================================== Test                             Result       Flag       Units  Drug Present not Declared for Prescription Verification   Hydrocodone                    395          UNEXPECTED ng/mg creat   Norhydrocodone                 583          UNEXPECTED ng/mg creat    Sources of hydrocodone include scheduled prescription medications.    Norhydrocodone is an expected metabolite of hydrocodone.  Drug Absent but Declared for Prescription Verification   Oxycodone                      Not Detected UNEXPECTED ng/mg creat ==================================================================== Test  Result    Flag   Units      Ref Range   Creatinine              165              mg/dL      >=40 ==================================================================== Declared Medications:  The flagging and interpretation on this report are based on the  following declared medications.  Unexpected results may arise from  inaccuracies in the declared medications.   **Note: The testing  scope of this panel includes these medications:   Oxycodone (OxyIR)  Oxycodone (Percocet)   **Note: The testing scope of this panel does not include the  following reported medications:   Acetaminophen (Tylenol)  Acetaminophen (Percocet)  Cyclobenzaprine (Flexeril)  Fluoxetine (Prozac)  Naloxone (Narcan)  Ondansetron (Zofran)  Scopolamine  Tizanidine (Zanaflex)  Trazodone (Desyrel) ==================================================================== For clinical consultation, please call [phone]. ====================================================================       ROS  Constitutional: Denies any fever or chills Gastrointestinal: No reported hemesis, hematochezia, vomiting, or acute GI distress Musculoskeletal: Denies any acute onset joint swelling, redness, loss of ROM, or weakness Neurological: No reported episodes of acute onset apraxia, aphasia, dysarthria, agnosia, amnesia, paralysis, loss of coordination, or loss of consciousness  Medication Review  FLUoxetine, QUEtiapine, acetaminophen, lubiprostone, naloxone, ondansetron, oxyCODONE, and scopolamine  History Review  Allergy: Karen Edwards is allergic to gabapentin. Drug: Karen Edwards  reports no history of drug use. Alcohol:  reports current alcohol use of about 1.0 standard drink of alcohol per week. Tobacco:  reports that she has been smoking cigarettes. She has a 18.50 pack-year smoking history. She has never used smokeless tobacco. Social: Karen Edwards  reports that she has been smoking cigarettes. She has a 18.50 pack-year smoking history. She has never used smokeless tobacco. She reports current alcohol use of about 1.0 standard drink of alcohol per week. She reports that she does not use drugs. Medical:  has a past medical history of Anxiety, Chronic back pain, Degenerative joint disease, Depression, Headache, Hypotension, IBS (irritable bowel syndrome) (05/2015), MVP (mitral valve prolapse), and Nausea  and vomiting. Surgical: Karen Edwards  has a past surgical history that includes Cesarean section; Ankle surgery (Right, 03/02/2014); Hemorrhoid surgery (N/A, 06/16/2015); Cervical spine surgery (02/21/2016); Cesarean section with bilateral tubal ligation (1990); Colonoscopy (04/19/2015); Hysteroscopy with D & C (12/24/2013); Colonoscopy with propofol (N/A, 05/18/2018); Esophagogastroduodenoscopy (egd) with propofol (N/A, 05/18/2018); Spinal cord stimulator insertion (N/A, 06/18/2018); Tubal ligation; and Lumbar spinal cord simulator revision (N/A, 03/14/2020). Family: family history includes Arthritis in her mother; Congestive Heart Failure in her maternal grandfather; Depression in her mother; Diabetes in her maternal aunt; Diabetes Mellitus I in her maternal grandmother; Gout in her maternal aunt; Hearing loss in her father; Heart attack in her maternal grandfather; Hyperlipidemia in her father and mother; Hypertension in her father and mother; Hypothyroidism in her mother; Kidney disease in her mother; Prostate cancer in her paternal grandfather; Stroke in her maternal grandmother; Thyroid disease in her maternal aunt; Vision loss in her mother.  Laboratory Chemistry Profile   Renal Lab Results  Component Value Date   BUN 12 02/01/2021   CREATININE 0.85 02/01/2021   BCR 8 (L) 09/08/2017   GFR 77.34 02/01/2021   GFRAA 93 09/08/2017   GFRNONAA 81 09/08/2017    Hepatic Lab Results  Component Value Date   AST 15 02/01/2021   ALT 11 02/01/2021   ALBUMIN 4.4 02/01/2021   ALKPHOS 59 02/01/2021    Electrolytes Lab Results  Component Value Date   NA 139 02/01/2021   K 4.2 02/01/2021   CL 105 02/01/2021   CALCIUM 9.3 02/01/2021   MG 2.1 09/08/2017    Bone Lab Results  Component Value Date   VD25OH 43.74 01/10/2016   25OHVITD1 51 09/08/2017   25OHVITD2 <1.0 09/08/2017   25OHVITD3 51 09/08/2017    Inflammation (CRP: Acute Phase) (ESR: Chronic Phase) Lab Results  Component Value Date   CRP  1.4 09/08/2017   ESRSEDRATE 11 09/08/2017         Note: Above Lab results reviewed.  Recent Imaging Review  DG PAIN CLINIC C-ARM 1-60 MIN NO REPORT Fluoro was used, but no Radiologist interpretation will be provided.  Please refer to "NOTES" tab for provider progress note. Note: Reviewed        Physical Exam  General appearance: Well nourished, well developed, and well hydrated. In no apparent acute distress Mental status: Alert, oriented x 3 (person, place, & time)       Respiratory: No evidence of acute respiratory distress Eyes: PERLA Vitals: BP 139/79   Pulse 99   Temp 98.1 F (36.7 C) (Temporal)   Resp 16   Ht 5\' 1"  (1.549 m)   Wt 163 lb (73.9 kg)   LMP 05/05/2017 (Exact Date)   SpO2 99%   BMI 30.80 kg/m  BMI: Estimated body mass index is 30.8 kg/m as calculated from the following:   Height as of this encounter: 5\' 1"  (1.549 m).   Weight as of this encounter: 163 lb (73.9 kg). Ideal: Ideal body weight: 47.8 kg (105 lb 6.1 oz) Adjusted ideal body weight: 58.3 kg (128 lb 6.8 oz)  Assessment   Diagnosis Status  1. Chronic pain syndrome   2. Malfunction of spinal cord stimulator, sequela   3. Pain due to any device, implant or graft (SCS battery) (Left PSIS area)   4. Presence of neurostimulator (Thoracolumbar)   5. Chronic low back pain (1ry area of Pain) (Bilateral) (R>L) w/ sciatica (Bilateral)   6. Chronic lower extremity pain (2ry area of Pain) (Bilateral) (R>L)   7. Chronic hip pain (3ry area of Pain) (Bilateral) (R>L)   8. Chronic neck pain (4th area of Pain) (Bilateral) (R>L)   9. Chronic occipital neuralgia (5th area of Pain) (Bilateral) (R>L)   10. Lumbar facet syndrome (Bilateral) (L>R)   11. Pharmacologic therapy   12. Chronic use of opiate for therapeutic purpose   13. Encounter for medication management   14. Encounter for chronic pain management   15. Therapeutic opioid-induced constipation (OIC)    Controlled Controlled Controlled   Updated  Problems: No problems updated.  Plan of Care  Problem-specific:  No problem-specific Assessment & Plan notes found for this encounter.  Karen Edwards has a current medication list which includes the following long-term medication(s): fluoxetine, lubiprostone, [START ON 05/09/2023] oxycodone, [START ON 06/08/2023] oxycodone, [START ON 07/08/2023] oxycodone, and quetiapine.  Pharmacotherapy (Medications Ordered): Meds ordered this encounter  Medications   oxyCODONE (OXY IR/ROXICODONE) 5 MG immediate release tablet    Sig: Take 1 tablet (5 mg total) by mouth 2 (two) times daily as needed for severe pain. Must last 30 days.    Dispense:  20 tablet    Refill:  0    DO NOT: delete (not duplicate); no partial-fill (will deny script to complete), no refill request (F/U required). DISPENSE: 1 day early if closed on fill date. WARN: No CNS-depressants within 8 hrs of med.   oxyCODONE (  OXY IR/ROXICODONE) 5 MG immediate release tablet    Sig: Take 1 tablet (5 mg total) by mouth 2 (two) times daily as needed for severe pain. Must last 30 days.    Dispense:  20 tablet    Refill:  0    DO NOT: delete (not duplicate); no partial-fill (will deny script to complete), no refill request (F/U required). DISPENSE: 1 day early if closed on fill date. WARN: No CNS-depressants within 8 hrs of med.   oxyCODONE (OXY IR/ROXICODONE) 5 MG immediate release tablet    Sig: Take 1 tablet (5 mg total) by mouth 2 (two) times daily as needed for severe pain. Must last 30 days.    Dispense:  20 tablet    Refill:  0    DO NOT: delete (not duplicate); no partial-fill (will deny script to complete), no refill request (F/U required). DISPENSE: 1 day early if closed on fill date. WARN: No CNS-depressants within 8 hrs of med.   lubiprostone (AMITIZA) 24 MCG capsule    Sig: Take 1 capsule (24 mcg total) by mouth 2 (two) times daily with a meal. Swallow the medication whole. Do not break or chew.    Dispense:  180 capsule     Refill:  0    Fill one day early if pharmacy is closed on scheduled refill date. Generic permitted. Do not send renewal requests.   Orders:  Orders Placed This Encounter  Procedures   Neurostimulator Lead Revision & replacement    Standing Status:   Future    Standing Expiration Date:   09/07/2023    Scheduling Instructions:     Schedule the patient for a spinal cord stimulator revision and possible removal of battery and/or leads.  Please arrange for the [employer] representative to be present for evaluation of the battery for final decision making.   Follow-up plan:   Return for (OR) Revision and possible Removal of SCS.      Interventional Therapies  Risk Factors  Considerations:   Mitral valve prolapse (MVP) GERD  urinary stress incontinence  GAD   Planned  Pending:   Diagnostic/therapeutic left lumbar facet MBB #4    Under consideration:   Therapeutic right L1-2 & L3-4 percutaneous discectomy ([employer] compressor)  Therapeutic intrathecal pump trial    Completed:   Therapeutic right cervical ESI x2 (12/17/2022) (100/100/90/100)  Diagnostic bilateral GONB x1 (08/19/2019) (100/100/100/,50)  Palliative right cervical facet MBB x3 (07/23/2018) (100/100/100/>50)  Palliative left cervical facet MBB x4 (04/01/2019) (90/90/0/0)  Palliative right cervical facet RFA x1 (09/17/2018) (100/100/85/85)  Palliative left cervical facet RFA x1 (05/04/2019) (100/100/50/50)  Therapeutic left L1-2 LESI x1 (05/10/2021) (100/100/100 x 2 days/0)  Therapeutic right L1-2 LESI x2 (02/10/2018) (100/100/70/50-75)  Therapeutic right L1 TFESI x1 (02/10/2018) (100/100/70/50-75)  Diagnostic/therapeutic left superior cluneal NB (L2, L3 dorsal rami) x1 (04/24/2021) (0/0/0/0)  Diagnostic/therapeutic left L1 TFESI x2 (11/15/2021) (100/100/0)  Diagnostic/therapeutic left L3 TFESI x2 (11/15/2021) (100/100/0)  Bilateral lumbar spinal cord stimulator trial (done) (05/14/2018) (by me)  Bilateral lumbar spinal cord  stimulator implant (09/28/2019) (by me)  Bilateral spinal cord stimulator revision (03/14/2020) (by me)  Diagnostic bilateral lumbar facet MBB x3 (11/30/2019) (100/100/50/20)  Diagnostic bilateral SI joint Blk x2 (09/18/2021) (100/100/100)  Palliative left lumbar facet RFA x3 (06/27/2022) (100/100/100/100)  Therapeutic right lumbar facet RFA x3 (05/21/2022) (100/100/75/75)    Therapeutic  Palliative (PRN) options:   Palliative right cervical facet RFA   Diagnostic bilateral SI joint Blk   Therapeutic bilateral lumbar facet RFA  Pharmacotherapy  Nonopioids transfer 08/23/2020: Lidocaine 5% ointment and Robaxin       Recent Visits No visits were found meeting these conditions. Showing recent visits within past 90 days and meeting all other requirements Today's Visits Date Type Provider Dept  05/07/23 Office Visit Delano Metz, MD [facility]  Showing today's visits and meeting all other requirements Future Appointments No visits were found meeting these conditions. Showing future appointments within next 90 days and meeting all other requirements  I discussed the assessment and treatment plan with the patient. The patient was provided an opportunity to ask questions and all were answered. The patient agreed with the plan and demonstrated an understanding of the instructions.  Patient advised to call back or seek an in-person evaluation if the symptoms or condition worsens.  Duration of encounter: 35 minutes.  Total time on encounter, as per AMA guidelines included both the face-to-face and non-face-to-face time personally spent by the physician and/or other qualified health care professional(s) on the day of the encounter (includes time in activities that require the physician or other qualified health care professional and does not include time in activities normally performed by clinical staff). Physician's time may include the following activities when  performed: Preparing to see the patient (e.g., pre-charting review of records, searching for previously ordered imaging, lab work, and nerve conduction tests) Review of prior analgesic pharmacotherapies. Reviewing PMP Interpreting ordered tests (e.g., lab work, imaging, nerve conduction tests) Performing post-procedure evaluations, including interpretation of diagnostic procedures Obtaining and/or reviewing separately obtained history Performing a medically appropriate examination and/or evaluation Counseling and educating the patient/family/caregiver Ordering medications, tests, or procedures Referring and communicating with other health care professionals (when not separately reported) Documenting clinical information in the electronic or other health record Independently interpreting results (not separately reported) and communicating results to the patient/ family/caregiver Care coordination (not separately reported)  Note by: Oswaldo Done, MD Date: 05/07/2023; Time: 1:59 PM

## 2023-05-07 NOTE — Patient Instructions (Signed)
____________________________________________________________________________________________  Opioid Pain Medication Update  To: All patients taking opioid pain medications. (I.e.: hydrocodone, hydromorphone, oxycodone, oxymorphone, morphine, codeine, methadone, tapentadol, tramadol, buprenorphine, fentanyl, etc.)  Re: Updated review of side effects and adverse reactions of opioid analgesics, as well as new information about long term effects of this class of medications.  Direct risks of long-term opioid therapy are not limited to opioid addiction and overdose. Potential medical risks include serious fractures, breathing problems during sleep, hyperalgesia, immunosuppression, chronic constipation, bowel obstruction, myocardial infarction, and tooth decay secondary to xerostomia.  Unpredictable adverse effects that can occur even if you take your medication correctly: Cognitive impairment, respiratory depression, and death. Most people think that if they take their medication "correctly", and "as instructed", that they will be safe. Nothing could be farther from the truth. In reality, a significant amount of recorded deaths associated with the use of opioids has occurred in individuals that had taken the medication for a long time, and were taking their medication correctly. The following are examples of how this can happen: Patient taking his/her medication for a long time, as instructed, without any side effects, is given a certain antibiotic or another unrelated medication, which in turn triggers a "Drug-to-drug interaction" leading to disorientation, cognitive impairment, impaired reflexes, respiratory depression or an untoward event leading to serious bodily harm or injury, including death.  Patient taking his/her medication for a long time, as instructed, without any side effects, develops an acute impairment of liver and/or kidney function. This will lead to a rapid inability of the body to  breakdown and eliminate their pain medication, which will result in effects similar to an "overdose", but with the same medicine and dose that they had always taken. This again may lead to disorientation, cognitive impairment, impaired reflexes, respiratory depression or an untoward event leading to serious bodily harm or injury, including death.  A similar problem will occur with patients as they grow older and their liver and kidney function begins to decrease as part of the aging process.  Background information: Historically, the original case for using long-term opioid therapy to treat chronic noncancer pain was based on safety assumptions that subsequent experience has called into question. In 1996, the American Pain Society and the American Academy of Pain Medicine issued a consensus statement supporting long-term opioid therapy. This statement acknowledged the dangers of opioid prescribing but concluded that the risk for addiction was low; respiratory depression induced by opioids was short-lived, occurred mainly in opioid-naive patients, and was antagonized by pain; tolerance was not a common problem; and efforts to control diversion should not constrain opioid prescribing. This has now proven to be wrong. Experience regarding the risks for opioid addiction, misuse, and overdose in community practice has failed to support these assumptions.  According to the Centers for Disease Control and Prevention, fatal overdoses involving opioid analgesics have increased sharply over the past decade. Currently, more than 96,700 people die from drug overdoses every year. Opioids are a factor in 7 out of every 10 overdose deaths. Deaths from drug overdose have surpassed motor vehicle accidents as the leading cause of death for individuals between the ages of 35 and 54.  Clinical data suggest that neuroendocrine dysfunction may be very common in both men and women, potentially causing hypogonadism, erectile  dysfunction, infertility, decreased libido, osteoporosis, and depression. Recent studies linked higher opioid dose to increased opioid-related mortality. Controlled observational studies reported that long-term opioid therapy may be associated with increased risk for cardiovascular events. Subsequent meta-analysis concluded   that the safety of long-term opioid therapy in elderly patients has not been proven.   Side Effects and adverse reactions: Common side effects: Drowsiness (sedation). Dizziness. Nausea and vomiting. Constipation. Physical dependence -- Dependence often manifests with withdrawal symptoms when opioids are discontinued or decreased. Tolerance -- As you take repeated doses of opioids, you require increased medication to experience the same effect of pain relief. Respiratory depression -- This can occur in healthy people, especially with higher doses. However, people with COPD, asthma or other lung conditions may be even more susceptible to fatal respiratory impairment.  Uncommon side effects: An increased sensitivity to feeling pain and extreme response to pain (hyperalgesia). Chronic use of opioids can lead to this. Delayed gastric emptying (the process by which the contents of your stomach are moved into your small intestine). Muscle rigidity. Immune system and hormonal dysfunction. Quick, involuntary muscle jerks (myoclonus). Arrhythmia. Itchy skin (pruritus). Dry mouth (xerostomia).  Long-term side effects: Chronic constipation. Sleep-disordered breathing (SDB). Increased risk of bone fractures. Hypothalamic-pituitary-adrenal dysregulation. Increased risk of overdose.  RISKS: Respiratory depression and death: Opioids increase the risk of respiratory depression and death.  Drug-to-drug interactions: Opioids are relatively contraindicated in combination with benzodiazepines, sleep inducers, and other central nervous system depressants. Other classes of medications  (i.e.: certain antibiotics and even over-the-counter medications) may also trigger or induce respiratory depression in some patients.  Medical conditions: Patients with pre-existing respiratory problems are at higher risk of respiratory failure and/or depression when in combination with opioid analgesics. Opioids are relatively contraindicated in some medical conditions such as central sleep apnea.   Fractures and Falls:  Opioids increase the risk and incidence of falls. This is of particular importance in elderly patients.  Endocrine System:  Long-term administration is associated with endocrine abnormalities (endocrinopathies). (Also known as Opioid-induced Endocrinopathy) Influences on both the hypothalamic-pituitary-adrenal axis?and the hypothalamic-pituitary-gonadal axis have been demonstrated with consequent hypogonadism and adrenal insufficiency in both sexes. Hypogonadism and decreased levels of dehydroepiandrosterone sulfate have been reported in men and women. Endocrine effects include: Amenorrhoea in women (abnormal absence of menstruation) Reduced libido in both sexes Decreased sexual function Erectile dysfunction in men Hypogonadisms (decreased testicular function with shrinkage of testicles) Infertility Depression and fatigue Loss of muscle mass Anxiety Depression Immune suppression Hyperalgesia Weight gain Anemia Osteoporosis Patients (particularly women of childbearing age) should avoid opioids. There is insufficient evidence to recommend routine monitoring of asymptomatic patients taking opioids in the long-term for hormonal deficiencies.  Immune System: Human studies have demonstrated that opioids have an immunomodulating effect. These effects are mediated via opioid receptors both on immune effector cells and in the central nervous system. Opioids have been demonstrated to have adverse effects on antimicrobial response and anti-tumour surveillance. Buprenorphine has  been demonstrated to have no impact on immune function.  Opioid Induced Hyperalgesia: Human studies have demonstrated that prolonged use of opioids can lead to a state of abnormal pain sensitivity, sometimes called opioid induced hyperalgesia (OIH). Opioid induced hyperalgesia is not usually seen in the absence of tolerance to opioid analgesia. Clinically, hyperalgesia may be diagnosed if the patient on long-term opioid therapy presents with increased pain. This might be qualitatively and anatomically distinct from pain related to disease progression or to breakthrough pain resulting from development of opioid tolerance. Pain associated with hyperalgesia tends to be more diffuse than the pre-existing pain and less defined in quality. Management of opioid induced hyperalgesia requires opioid dose reduction.  Cancer: Chronic opioid therapy has been associated with an increased risk of cancer   among noncancer patients with chronic pain. This association was more evident in chronic strong opioid users. Chronic opioid consumption causes significant pathological changes in the small intestine and colon. Epidemiological studies have found that there is a link between opium dependence and initiation of gastrointestinal cancers. Cancer is the second leading cause of death after cardiovascular disease. Chronic use of opioids can cause multiple conditions such as GERD, immunosuppression and renal damage as well as carcinogenic effects, which are associated with the incidence of cancers.   Mortality: Long-term opioid use has been associated with increased mortality among patients with chronic non-cancer pain (CNCP).  Prescription of long-acting opioids for chronic noncancer pain was associated with a significantly increased risk of all-cause mortality, including deaths from causes other than overdose.  Reference: Von Korff M, Kolodny A, Deyo RA, Chou R. Long-term opioid therapy reconsidered. Ann Intern Med. 2011  Sep 6;155(5):325-8. doi: 10.7326/[id number]. PMID: 21893626; PMCID: PMC3280085. Bedson J, Chen Y, Ashworth J, Hayward RA, Dunn KM, Jordan KP. Risk of adverse events in patients prescribed long-term opioids: A cohort study in the [place] Clinical Practice Research Datalink. Eur J Pain. 2019 May;23(5):908-922. doi: 10.1002/ejp.1357. Epub 2019 Jan 31. PMID: 30620116. Colameco S, Coren JS, Ciervo CA. Continuous opioid treatment for chronic noncancer pain: a time for moderation in prescribing. Postgrad Med. 2009 Jul;121(4):61-6. doi: 10.3810/pgm.2009.07.2032. PMID: 19641271. Chou R, Turner JA, Devine EB, Hansen RN, Sullivan SD, Blazina I, Dana T, Bougatsos C, Deyo RA. The effectiveness and risks of long-term opioid therapy for chronic pain: a systematic review for a National Institutes of Health Pathways to Prevention Workshop. Ann Intern Med. 2015 Feb 17;162(4):276-86. doi: 10.7326/M14-2559. PMID: 25581257. Warner M, Chen LH, Makuc DM. NCHS Data Brief No. 22. [place]: Centers for Disease Control and Prevention; 2009. Sep, Increase in Fatal Poisonings Involving Opioid Analgesics in the [place], 1999-2006. Song IA, Choi HR, Oh TK. Long-term opioid use and mortality in patients with chronic non-cancer pain: Ten-year follow-up study in [place] from 2010 through 2019. EClinicalMedicine. 2022 Jul 18;51:101558. doi: 10.1016/j.eclinm.2022.101558. PMID: 35875817; PMCID: PMC9304910. Huser, W., Schubert, T., Vogelmann, T. et al. All-cause mortality in patients with long-term opioid therapy compared with non-opioid analgesics for chronic non-cancer pain: a database study. BMC Med 18, 162 (2020). https://doi.org/10.1186/[email or web address] Rashidian H, Zendehdel K, Kamangar F, Malekzadeh R, Haghdoost AA. An Ecological Study of the Association between Opiate Use and Incidence of Cancers. Addict Health. 2016 Fall;8(4):252-260. PMID: 28819556; PMCID: PMC5554805.  Our Goal: Our goal is to control your  pain with means other than the use of opioid pain medications.  Our Recommendation: Talk to your physician about coming off of these medications. We can assist you with the tapering down and stopping these medicines. Based on the new information, even if you cannot completely stop the medication, a decrease in the dose may be associated with a lesser risk. Ask for other means of controlling the pain. Decrease or eliminate those factors that significantly contribute to your pain such as smoking, obesity, and a diet heavily tilted towards "inflammatory" nutrients.  Last Updated: 05/05/2023   ____________________________________________________________________________________________     ____________________________________________________________________________________________  Transfer of Pain Medication between Pharmacies  Re: 2023 DEA Clarification on existing regulation  Published on DEA Website: June 28, 2022  Title: Revised Regulation Allows DEA-Registered Pharmacies to Transfer Electronic Prescriptions at a Patient's Request DEA Headquarters Division - Public Information Office  "Patients now have the ability to request their electronic prescription be transferred to another pharmacy without having to go back to their practitioner to initiate the   request. This revised regulation went into effect on Monday, June 24, 2022.     At a patient's request, a DEA-registered retail pharmacy can now transfer an electronic prescription for a controlled substance (schedules II-V) to another DEA-registered retail pharmacy. Prior to this change, patients would have to go through their practitioner to cancel their prescription and have it re-issued to a different pharmacy. The process was taxing and time consuming for both patients and practitioners.    The Drug Enforcement Administration (DEA) published its intent to revise the process for transferring electronic prescriptions on September 15, 2020.  The final rule was published in the federal register on May 23, 2022 and went into effect 30 days later.  Under the final rule, a prescription can only be transferred once between pharmacies, and only if allowed under existing state or other applicable law. The prescription must remain in its electronic form; may not be altered in any way; and the transfer must be communicated directly between two licensed pharmacists. It's important to note, any authorized refills transfer with the original prescription, which means the entire prescription will be filled at the same pharmacy."    REFERENCES: 1. DEA website announcement https://www.dea.gov/stories/2023/2023-06/2022-09-01/revised-regulation-allows-dea-registered-pharmacies-transfer  2. Department of Justice website  https://www.govinfo.gov/content/pkg/FR-2022-05-23/pdf/2023-15847.pdf  3. DEPARTMENT OF JUSTICE Drug Enforcement Administration 21 CFR Part 1306 [Docket No. DEA-637] RIN [id number] "Transfer of Electronic Prescriptions for Schedules II-V Controlled Substances Between Pharmacies for Initial Filling"  ____________________________________________________________________________________________     _______________________________________________________________________  Medication Rules  Purpose: To inform patients, and their family members, of our medication rules and regulations.  Applies to: All patients receiving prescriptions from our practice (written or electronic).  Pharmacy of record: This is the pharmacy where your electronic prescriptions will be sent. Make sure we have the correct one.  Electronic prescriptions: In compliance with the [place] Strengthen Opioid Misuse Prevention (STOP) Act of 2017 (Session Law 2017-74/H243), effective October 28, 2018, all controlled substances must be electronically prescribed. Written prescriptions, faxing, or calling prescriptions to a pharmacy will no longer be  done.  Prescription refills: These will be provided only during in-person appointments. No medications will be renewed without a "face-to-face" evaluation with your provider. Applies to all prescriptions.  NOTE: The following applies primarily to controlled substances (Opioid* Pain Medications).   Type of encounter (visit): For patients receiving controlled substances, face-to-face visits are required. (Not an option and not up to the patient.)  Patient's responsibilities: Pain Pills: Bring all pain pills to every appointment (except for procedure appointments). Pill Bottles: Bring pills in original pharmacy bottle. Bring bottle, even if empty. Always bring the bottle of the most recent fill.  Medication refills: You are responsible for knowing and keeping track of what medications you are taking and when is it that you will need a refill. The day before your appointment: write a list of all prescriptions that need to be refilled. The day of the appointment: give the list to the admitting nurse. Prescriptions will be written only during appointments. No prescriptions will be written on procedure days. If you forget a medication: it will not be "Called in", "Faxed", or "electronically sent". You will need to get another appointment to get these prescribed. No early refills. Do not call asking to have your prescription filled early. Partial  or short prescriptions: Occasionally your pharmacy may not have enough pills to fill your prescription.  NEVER ACCEPT a partial fill or a prescription that is short of the total amount of pills that you were prescribed.    With controlled substances the law allows 72 hours for the pharmacy to complete the prescription.  If the prescription is not completed within 72 hours, the pharmacist will require a new prescription to be written. This means that you will be short on your medicine and we WILL NOT send another prescription to complete your original prescription.   Instead, request the pharmacy to send a carrier to a nearby branch to get enough medication to provide you with your full prescription. Prescription Accuracy: You are responsible for carefully inspecting your prescriptions before leaving our office. Have the discharge nurse carefully go over each prescription with you, before taking them home. Make sure that your name is accurately spelled, that your address is correct. Check the name and dose of your medication to make sure it is accurate. Check the number of pills, and the written instructions to make sure they are clear and accurate. Make sure that you are given enough medication to last until your next medication refill appointment. Taking Medication: Take medication as prescribed. When it comes to controlled substances, taking less pills or less frequently than prescribed is permitted and encouraged. Never take more pills than instructed. Never take the medication more frequently than prescribed.  Inform other Doctors: Always inform, all of your healthcare providers, of all the medications you take. Pain Medication from other Providers: You are not allowed to accept any additional pain medication from any other Doctor or Healthcare provider. There are two exceptions to this rule. (see below) In the event that you require additional pain medication, you are responsible for notifying us, as stated below. Cough Medicine: Often these contain an opioid, such as codeine or hydrocodone. Never accept or take cough medicine containing these opioids if you are already taking an opioid* medication. The combination may cause respiratory failure and death. Medication Agreement: You are responsible for carefully reading and following our Medication Agreement. This must be signed before receiving any prescriptions from our practice. Safely store a copy of your signed Agreement. Violations to the Agreement will result in no further prescriptions. (Additional copies of  our Medication Agreement are available upon request.) Laws, Rules, & Regulations: All patients are expected to follow all Federal and State Laws, Statutes, Rules, & Regulations. Ignorance of the Laws does not constitute a valid excuse.  Illegal drugs and Controlled Substances: The use of illegal substances (including, but not limited to marijuana and its derivatives) and/or the illegal use of any controlled substances is strictly prohibited. Violation of this rule may result in the immediate and permanent discontinuation of any and all prescriptions being written by our practice. The use of any illegal substances is prohibited. Adopted CDC guidelines & recommendations: Target dosing levels will be at or below 60 MME/day. Use of benzodiazepines** is not recommended.  Exceptions: There are only two exceptions to the rule of not receiving pain medications from other Healthcare Providers. Exception #1 (Emergencies): In the event of an emergency (i.e.: accident requiring emergency care), you are allowed to receive additional pain medication. However, you are responsible for: As soon as you are able, call our office [phone], at any time of the day or night, and leave a message stating your name, the date and nature of the emergency, and the name and dose of the medication prescribed. In the event that your call is answered by a member of our staff, make sure to document and save the date, time, and the name of the person that took your information.  Exception #2 (  Planned Surgery): In the event that you are scheduled by another doctor or dentist to have any type of surgery or procedure, you are allowed (for a period no longer than 30 days), to receive additional pain medication, for the acute post-op pain. However, in this case, you are responsible for picking up a copy of our "Post-op Pain Management for Surgeons" handout, and giving it to your surgeon or dentist. This document is available at our office,  and does not require an appointment to obtain it. Simply go to our office during business hours (Monday-Thursday from 8:00 AM to 4:00 PM) (Friday 8:00 AM to 12:00 Noon) or if you have a scheduled appointment with us, prior to your surgery, and ask for it by name. In addition, you are responsible for: calling our office [phone], at any time of the day or night, and leaving a message stating your name, name of your surgeon, type of surgery, and date of procedure or surgery. Failure to comply with your responsibilities may result in termination of therapy involving the controlled substances. Medication Agreement Violation. Following the above rules, including your responsibilities will help you in avoiding a Medication Agreement Violation ("Breaking your Pain Medication Contract").  Consequences:  Not following the above rules may result in permanent discontinuation of medication prescription therapy.  *Opioid medications include: morphine, codeine, oxycodone, oxymorphone, hydrocodone, hydromorphone, meperidine, tramadol, tapentadol, buprenorphine, fentanyl, methadone. **Benzodiazepine medications include: diazepam (Valium), alprazolam (Xanax), clonazepam (Klonopine), lorazepam (Ativan), clorazepate (Tranxene), chlordiazepoxide (Librium), estazolam (Prosom), oxazepam (Serax), temazepam (Restoril), triazolam (Halcion) (Last updated: 08/20/2022) ______________________________________________________________________    ______________________________________________________________________  Medication Recommendations and Reminders  Applies to: All patients receiving prescriptions (written and/or electronic).  Medication Rules & Regulations: You are responsible for reading, knowing, and following our "Medication Rules" document. These exist for your safety and that of others. They are not flexible and neither are we. Dismissing or ignoring them is an act of "non-compliance" that may result in  complete and irreversible termination of such medication therapy. For safety reasons, "non-compliance" will not be tolerated. As with the [place] fundamental legal principle of "ignorance of the law is no defense", we will accept no excuses for not having read and knowing the content of documents provided to you by our practice.  Pharmacy of record:  Definition: This is the pharmacy where your electronic prescriptions will be sent.  We do not endorse any particular pharmacy. It is up to you and your insurance to decide what pharmacy to use.  We do not restrict you in your choice of pharmacy. However, once we write for your prescriptions, we will NOT be re-sending more prescriptions to fix restricted supply problems created by your pharmacy, or your insurance.  The pharmacy listed in the electronic medical record should be the one where you want electronic prescriptions to be sent. If you choose to change pharmacy, simply notify our nursing staff. Changes will be made only during your regular appointments and not over the phone.  Recommendations: Keep all of your pain medications in a safe place, under lock and key, even if you live alone. We will NOT replace lost, stolen, or damaged medication. We do not accept "Police Reports" as proof of medications having been stolen. After you fill your prescription, take 1 week's worth of pills and put them away in a safe place. You should keep a separate, properly labeled bottle for this purpose. The remainder should be kept in the original bottle. Use this as your primary supply, until it runs out.   Once it's gone, then you know that you have 1 week's worth of medicine, and it is time to come in for a prescription refill. If you do this correctly, it is unlikely that you will ever run out of medicine. To make sure that the above recommendation works, it is very important that you make sure your medication refill appointments are scheduled at least 1 week before you  run out of medicine. To do this in an effective manner, make sure that you do not leave the office without scheduling your next medication management appointment. Always ask the nursing staff to show you in your prescription , when your medication will be running out. Then arrange for the receptionist to get you a return appointment, at least 7 days before you run out of medicine. Do not wait until you have 1 or 2 pills left, to come in. This is very poor planning and does not take into consideration that we may need to cancel appointments due to bad weather, sickness, or emergencies affecting our staff. DO NOT ACCEPT A "Partial Fill": If for any reason your pharmacy does not have enough pills/tablets to completely fill or refill your prescription, do not allow for a "partial fill". The law allows the pharmacy to complete that prescription within 72 hours, without requiring a new prescription. If they do not fill the rest of your prescription within those 72 hours, you will need a separate prescription to fill the remaining amount, which we will NOT provide. If the reason for the partial fill is your insurance, you will need to talk to the pharmacist about payment alternatives for the remaining tablets, but again, DO NOT ACCEPT A PARTIAL FILL, unless you can trust your pharmacist to obtain the remainder of the pills within 72 hours.  Prescription refills and/or changes in medication(s):  Prescription refills, and/or changes in dose or medication, will be conducted only during scheduled medication management appointments. (Applies to both, written and electronic prescriptions.) No refills on procedure days. No medication will be changed or started on procedure days. No changes, adjustments, and/or refills will be conducted on a procedure day. Doing so will interfere with the diagnostic portion of the procedure. No phone refills. No medications will be "called into the pharmacy". No Fax refills. No weekend  refills. No Holliday refills. No after hours refills.  Remember:  Business hours are:  Monday to Thursday 8:00 AM to 4:00 PM Provider's Schedule: Lassie Demorest, MD - Appointments are:  Medication management: Monday and Wednesday 8:00 AM to 4:00 PM Procedure day: Tuesday and Thursday 7:30 AM to 4:00 PM Bilal Lateef, MD - Appointments are:  Medication management: Tuesday and Thursday 8:00 AM to 4:00 PM Procedure day: Monday and Wednesday 7:30 AM to 4:00 PM (Last update: 08/20/2022) ______________________________________________________________________   ____________________________________________________________________________________________  Naloxone Nasal Spray  Why am I receiving this medication? [place] STOP ACT requires that all patients taking high dose opioids or at risk of opioids respiratory depression, be prescribed an opioid reversal agent, such as Naloxone (AKA: Narcan).  What is this medication? NALOXONE (nal OX one) treats opioid overdose, which causes slow or shallow breathing, severe drowsiness, or trouble staying awake. Call emergency services after using this medication. You may need additional treatment. Naloxone works by reversing the effects of opioids. It belongs to a group of medications called opioid blockers.  COMMON BRAND NAME(S): Kloxxado, Narcan  What should I tell my care team before I take this medication? They need to know if you have   any of these conditions: Heart disease Substance use disorder An unusual or allergic reaction to naloxone, other medications, foods, dyes, or preservatives Pregnant or trying to get pregnant Breast-feeding  When to use this medication? This medication is to be used for the treatment of respiratory depression (less than 8 breaths per minute) secondary to opioid overdose.   How to use this medication? This medication is for use in the nose. Lay the person on their back. Support their neck with your hand  and allow the head to tilt back before giving the medication. The nasal spray should be given into 1 nostril. After giving the medication, move the person onto their side. Do not remove or test the nasal spray until ready to use. Get emergency medical help right away after giving the first dose of this medication, even if the person wakes up. You should be familiar with how to recognize the signs and symptoms of a narcotic overdose. If more doses are needed, give the additional dose in the other nostril. Talk to your care team about the use of this medication in children. While this medication may be prescribed for children as young as newborns for selected conditions, precautions do apply.  Naloxone Overdosage: If you think you have taken too much of this medicine contact a poison control center or emergency room at once.  NOTE: This medicine is only for you. Do not share this medicine with others.  What if I miss a dose? This does not apply.  What may interact with this medication? This is only used during an emergency. No interactions are expected during emergency use. This list may not describe all possible interactions. Give your health care provider a list of all the medicines, herbs, non-prescription drugs, or dietary supplements you use. Also tell them if you smoke, drink alcohol, or use illegal drugs. Some items may interact with your medicine.  What should I watch for while using this medication? Keep this medication ready for use in the case of an opioid overdose. Make sure that you have the phone number of your care team and local hospital ready. You may need to have additional doses of this medication. Each nasal spray contains a single dose. Some emergencies may require additional doses. After use, bring the treated person to the nearest hospital or call 911. Make sure the treating care team knows that the person has received a dose of this medication. You will receive additional  instructions on what to do during and after use of this medication before an emergency occurs.  What side effects may I notice from receiving this medication? Side effects that you should report to your care team as soon as possible: Allergic reactions--skin rash, itching, hives, swelling of the face, lips, tongue, or throat Side effects that usually do not require medical attention (report these to your care team if they continue or are bothersome): Constipation Dryness or irritation inside the nose Headache Increase in blood pressure Muscle spasms Stuffy nose Toothache This list may not describe all possible side effects. Call your doctor for medical advice about side effects. You may report side effects to FDA at 1-800-FDA-1088.  Where should I keep my medication? Because this is an emergency medication, you should keep it with you at all times.  Keep out of the reach of children and pets. Store between 20 and 25 degrees C (68 and 77 degrees F). Do not freeze. Throw away any unused medication after the expiration date. Keep in original box   until ready to use.  NOTE: This sheet is a summary. It may not cover all possible information. If you have questions about this medicine, talk to your doctor, pharmacist, or health care provider.   2023 Elsevier/Gold Standard (2021-06-22 00:00:00)  ____________________________________________________________________________________________   

## 2023-05-07 NOTE — Progress Notes (Signed)
Nursing Pain Medication Assessment:  Safety precautions to be maintained throughout the outpatient stay will include: orient to surroundings, keep bed in low position, maintain call bell within reach at all times, provide assistance with transfer out of bed and ambulation.  Medication Inspection Compliance: Pill count conducted under aseptic conditions, in front of the patient. Neither the pills nor the bottle was removed from the patient's sight at any time. Once count was completed pills were immediately returned to the patient in their original bottle.  Medication: Oxycodone IR Pill/Patch Count:  16 of 20 pills remain Pill/Patch Appearance: Markings consistent with prescribed medication Bottle Appearance: Standard pharmacy container. Clearly labeled. Filled Date: 07 / 08 / 2024 Last Medication intake:  Yesterday

## 2023-05-12 ENCOUNTER — Other Ambulatory Visit: Payer: Self-pay

## 2023-05-12 ENCOUNTER — Telehealth: Payer: Self-pay

## 2023-05-12 ENCOUNTER — Encounter: Payer: Self-pay | Admitting: Physician Assistant

## 2023-05-12 ENCOUNTER — Ambulatory Visit: Payer: Medicare PPO | Admitting: Physician Assistant

## 2023-05-12 VITALS — BP 125/84 | HR 102 | Temp 99.0°F | Ht 61.0 in | Wt 168.2 lb

## 2023-05-12 DIAGNOSIS — R1011 Right upper quadrant pain: Secondary | ICD-10-CM

## 2023-05-12 DIAGNOSIS — K219 Gastro-esophageal reflux disease without esophagitis: Secondary | ICD-10-CM

## 2023-05-12 DIAGNOSIS — K5904 Chronic idiopathic constipation: Secondary | ICD-10-CM | POA: Diagnosis not present

## 2023-05-12 DIAGNOSIS — Z86010 Personal history of colonic polyps: Secondary | ICD-10-CM | POA: Diagnosis not present

## 2023-05-12 NOTE — Progress Notes (Signed)
Celso Amy, PA-C [address]  [address]  [place], [place] [address]  Main: [phone]  Fax: [phone]   Primary Care Physician: Melburn Popper, FNP  Primary Gastroenterologist:  Celso Amy, PA-C / Dr. Lannette Donath    CC: Follow-up chronic constipation, nausea and vomiting  HPI: Karen Edwards is a 57 y.o. female patient last saw Dr. Allegra Lai 01/2022 to follow-up with chronic constipation, nausea, and vomiting.  History of IBS.  History of chronic neck pain and chronic opioid use.  Followed by pain management.  Had tried MiraLAX in the past with little benefit.  Linzess was too expensive.  In 01/2022 Dr. Allegra Lai gave her a trial of Trulance which did not work well.  Most recently, her pain management doctor started her on Amitiza 8 mcg twice daily for the past week which is not working well.  She has not had a bowel movement in 2 weeks.  She denies rectal bleeding or weight loss.  Admits to weight gain, abdominal bloating, abdominal swelling, belching, and generalized abdominal pain.  She has had some episodes of RUQ pain under her right breast intermittently for 3 months.  Still has gallbladder.  History of right rib fracture last summer.  Hemorrhoidectomy in 2016.  Colonoscopy in 2016 (report unavailable).  EGD 04/2018 showed gastritis, nonbleeding erosive gastropathy.  Biopsies negative for H. pylori and celiac.  Colonoscopy 04/2018 showed 2 small polyps (1 tubular adenoma and 1 sessile serrated polyp removed) from descending colon.  5-year repeat is due..  Current Outpatient Medications  Medication Sig Dispense Refill   acetaminophen (TYLENOL) 500 MG tablet Take 1,000 mg by mouth every 6 (six) hours as needed (for pain.).      FLUoxetine (PROZAC) 20 MG capsule Take 3 capsules (60 mg total) by mouth daily. For depression. Office visit required for further refills. 270 capsule 0   lubiprostone (AMITIZA) 24 MCG capsule Take 1 capsule (24 mcg total) by mouth 2 (two) times  daily with a meal. Swallow the medication whole. Do not break or chew. 180 capsule 0   naloxone (NARCAN) nasal spray 4 mg/0.1 mL Place 1 spray into the nose as needed for up to 365 doses (for opioid-induced respiratory depresssion). In case of emergency (overdose), spray once into each nostril. If no response within 3 minutes, repeat application and call 911. 1 each 0   ondansetron (ZOFRAN-ODT) 4 MG disintegrating tablet Take 4 mg by mouth every 4 (four) hours as needed (vertigo/dizziness).      oxyCODONE (OXY IR/ROXICODONE) 5 MG immediate release tablet Take 1 tablet (5 mg total) by mouth 2 (two) times daily as needed for severe pain. Must last 30 days. 20 tablet 0   [START ON 06/08/2023] oxyCODONE (OXY IR/ROXICODONE) 5 MG immediate release tablet Take 1 tablet (5 mg total) by mouth 2 (two) times daily as needed for severe pain. Must last 30 days. 20 tablet 0   [START ON 07/08/2023] oxyCODONE (OXY IR/ROXICODONE) 5 MG immediate release tablet Take 1 tablet (5 mg total) by mouth 2 (two) times daily as needed for severe pain. Must last 30 days. 20 tablet 0   QUEtiapine (SEROQUEL) 100 MG tablet Take 100 mg by mouth at bedtime.     scopolamine (TRANSDERM-SCOP) 1 MG/3DAYS Place 1 patch onto the skin daily as needed (vertigo/dizziness).     No current facility-administered medications for this visit.    Allergies as of 05/12/2023 - Review Complete 05/12/2023  Allergen Reaction Noted   Gabapentin  08/13/2021  Past Medical History:  Diagnosis Date   Anxiety    Chronic back pain    Degenerative joint disease    Depression    Headache    neck pain   Hypotension    IBS (irritable bowel syndrome) 05/2015   MVP (mitral valve prolapse)    patient denies   Nausea and vomiting     Past Surgical History:  Procedure Laterality Date   ANKLE SURGERY Right 03/02/2014   repair of tendon and sural nerve right ankle   CERVICAL SPINE SURGERY  02/21/2016   arthrodesis of anterior cervical spine (fusion of  C5-6,C6-7 and insertion of bone allograft   CESAREAN SECTION     CESAREAN SECTION WITH BILATERAL TUBAL LIGATION  1990   COLONOSCOPY  04/19/2015   COLONOSCOPY WITH PROPOFOL N/A 05/18/2018   Procedure: COLONOSCOPY WITH PROPOFOL;  Surgeon: Toney Reil, MD;  Location: [facility] ENDOSCOPY;  Service: Gastroenterology;  Laterality: N/A;   ESOPHAGOGASTRODUODENOSCOPY (EGD) WITH PROPOFOL N/A 05/18/2018   Procedure: ESOPHAGOGASTRODUODENOSCOPY (EGD) WITH PROPOFOL;  Surgeon: Toney Reil, MD;  Location: [facility] ENDOSCOPY;  Service: Gastroenterology;  Laterality: N/A;   HEMORRHOID SURGERY N/A 06/16/2015   Procedure: HEMORRHOIDECTOMY;  Surgeon: Nadeen Landau, MD;  Location: [facility] ORS;  Service: General;  Laterality: N/A;   HYSTEROSCOPY WITH D & C  12/24/2013   LUMBAR SPINAL CORD SIMULATOR REVISION N/A 03/14/2020   Procedure: LUMBAR SPINAL CORD SIMULATOR REVISION;  Surgeon: Delano Metz, MD;  Location: [facility] ORS;  Service: Neurosurgery;  Laterality: N/A;   SPINAL CORD STIMULATOR INSERTION N/A 06/18/2018   Procedure: LUMBAR SPINAL CORD STIMULATOR INSERTION;  Surgeon: Delano Metz, MD;  Location: [facility] ORS;  Service: Neurosurgery;  Laterality: N/A;   TUBAL LIGATION      Review of Systems:    All systems reviewed and negative except where noted in HPI.   Physical Examination:   BP 125/84   Pulse (!) 102   Temp 99 F (37.2 C)   Ht 5\' 1"  (1.549 m)   Wt 168 lb 3.2 oz (76.3 kg)   LMP 05/05/2017 (Exact Date)   BMI 31.78 kg/m   General: Well-nourished, well-developed in no acute distress.  Eyes: No icterus. Conjunctivae pink. Mouth: Oropharyngeal mucosa moist and pink , no lesions erythema or exudate. Lungs: Clear to auscultation bilaterally. Non-labored. Heart: Regular rate and rhythm, no murmurs rubs or gallops.  Abdomen: Bowel sounds are normal; Abdomen is Soft; No hepatosplenomegaly, masses or hernias;  No Abdominal Tenderness; No guarding or rebound tenderness. Extremities: No  lower extremity edema. No clubbing or deformities. Neuro: Alert and oriented x 3.  Grossly intact. Skin: Warm and dry, no jaundice.   Psych: Alert and cooperative, normal mood and affect.   Imaging Studies: No results found.  Assessment and Plan:   ARRETTA TOENJES is a 57 y.o. y/o female presents for follow-up of:  1.  Chronic constipation /opioid-induced constipation  Increase Amitiza from 8 mcg to 24 mcg 1 capsule twice daily.  Take with food.  She has tried and failed MiraLAX, Linzess, and Trulance.  If higher dose Amitiza does not work, then Programmer, systems or Patent attorney.  2.  GERD  Start OTC Prilosec 20 Mg once daily.  GERD lifestyle modification.  3.  History of colon polyps  Scheduling Colonoscopy I discussed risks of colonoscopy with patient to include risk of bleeding, colon perforation, and risk of sedation.  Patient expressed understanding and agrees to proceed with colonoscopy.   4.  RUQ pain; could be musculoskeletal related  to injury with rib fracture 1 year ago.  Scheduling RUQ abdominal ultrasound to rule out cholelithiasis.    Celso Amy, PA-C  Follow up in 6 months or as needed based on colonoscopy results and symptoms.

## 2023-05-12 NOTE — Telephone Encounter (Signed)
Ultrasound scheduled 05/19/23 @ 9:15 .Nothing to eat/drink after midnight.   Patient notified.

## 2023-05-19 ENCOUNTER — Ambulatory Visit
Admission: RE | Admit: 2023-05-19 | Discharge: 2023-05-19 | Disposition: A | Discharge status: Home / Self Care | Payer: Medicare PPO | Source: Ambulatory Visit | Attending: Physician Assistant | Admitting: Physician Assistant

## 2023-05-19 DIAGNOSIS — R1011 Right upper quadrant pain: Secondary | ICD-10-CM

## 2023-05-19 NOTE — Progress Notes (Signed)
RUQ US shows NO gallstones.  Normal bile duct.  There is evidence of fatty liver (steatosis).  Nothing worrisome.  I recommend low fat diet and regular exercise.  Continue with plan to take Amitiza (lubiprostone) 1 tablet twice daily for constipation.

## 2023-05-20 ENCOUNTER — Telehealth: Payer: Self-pay

## 2023-05-20 MED ORDER — PEG 3350-KCL-NA BICARB-NACL 420 G PO SOLR: 4000.0000 mL | Freq: Once | ORAL | 0 refills | Status: AC

## 2023-05-20 NOTE — Telephone Encounter (Signed)
Left message for patient to return call to office   RUQ US shows NO gallstones.  Normal bile duct.  There is evidence of fatty liver (steatosis).  Nothing worrisome.  I recommend low fat diet and regular exercise.  Continue with plan to take Amitiza (lubiprostone) 1 tablet twice daily for constipation.

## 2023-05-20 NOTE — Telephone Encounter (Signed)
Error

## 2023-05-20 NOTE — Telephone Encounter (Signed)
Colonoscopy scheduled 06/25/23 w/ Dr.Vanga -discussed prep instructions with patient-also mailed copy of instructions as well.

## 2023-05-20 NOTE — Addendum Note (Signed)
Addended by: Martyn Ehrich on: 05/20/2023 01:31 PM   Modules accepted: Orders

## 2023-05-21 ENCOUNTER — Other Ambulatory Visit (HOSPITAL_COMMUNITY): Payer: Medicare PPO

## 2023-06-24 ENCOUNTER — Encounter: Payer: Self-pay | Admitting: Gastroenterology

## 2023-06-25 ENCOUNTER — Encounter
Admission: RE | Disposition: A | Discharge status: Home / Self Care | Payer: Self-pay | Source: Ambulatory Visit | Attending: Gastroenterology

## 2023-06-25 ENCOUNTER — Ambulatory Visit
Admission: RE | Admit: 2023-06-25 | Discharge: 2023-06-25 | Disposition: A | Discharge status: Home / Self Care | Payer: Medicare PPO | Source: Ambulatory Visit | Attending: Gastroenterology | Admitting: Gastroenterology

## 2023-06-25 ENCOUNTER — Other Ambulatory Visit: Payer: Self-pay

## 2023-06-25 ENCOUNTER — Ambulatory Visit: Payer: Medicare PPO | Admitting: Anesthesiology

## 2023-06-25 ENCOUNTER — Encounter: Payer: Self-pay | Admitting: Gastroenterology

## 2023-06-25 DIAGNOSIS — Z538 Procedure and treatment not carried out for other reasons: Secondary | ICD-10-CM | POA: Insufficient documentation

## 2023-06-25 DIAGNOSIS — G8929 Other chronic pain: Secondary | ICD-10-CM | POA: Insufficient documentation

## 2023-06-25 DIAGNOSIS — F32A Depression, unspecified: Secondary | ICD-10-CM | POA: Diagnosis not present

## 2023-06-25 DIAGNOSIS — Z86010 Personal history of colonic polyps: Secondary | ICD-10-CM | POA: Insufficient documentation

## 2023-06-25 DIAGNOSIS — Z1211 Encounter for screening for malignant neoplasm of colon: Secondary | ICD-10-CM | POA: Insufficient documentation

## 2023-06-25 DIAGNOSIS — F17210 Nicotine dependence, cigarettes, uncomplicated: Secondary | ICD-10-CM | POA: Diagnosis not present

## 2023-06-25 DIAGNOSIS — F419 Anxiety disorder, unspecified: Secondary | ICD-10-CM | POA: Diagnosis not present

## 2023-06-25 HISTORY — PX: COLONOSCOPY WITH PROPOFOL: SHX5780

## 2023-06-25 SURGERY — COLONOSCOPY WITH PROPOFOL
Anesthesia: General

## 2023-06-25 MED ORDER — LIDOCAINE HCL (PF) 2 % IJ SOLN
INTRAMUSCULAR | Status: AC
  Filled 2023-06-25: qty 5

## 2023-06-25 MED ORDER — PROPOFOL 10 MG/ML IV BOLUS
INTRAVENOUS | Status: AC
  Filled 2023-06-25: qty 20

## 2023-06-25 MED ORDER — SODIUM CHLORIDE 0.9 % IV SOLN: INTRAVENOUS | Status: DC

## 2023-06-25 MED ORDER — PROPOFOL 10 MG/ML IV BOLUS
INTRAVENOUS | Status: DC | PRN
  Administered 2023-06-25: 110 mg via INTRAVENOUS
  Administered 2023-06-25: 160 ug/kg/min via INTRAVENOUS

## 2023-06-25 MED ORDER — LIDOCAINE HCL (CARDIAC) PF 100 MG/5ML IV SOSY
PREFILLED_SYRINGE | INTRAVENOUS | Status: DC | PRN
  Administered 2023-06-25: 100 mg via INTRAVENOUS

## 2023-06-25 MED ORDER — LINACLOTIDE 290 MCG PO CAPS: 290.0000 ug | ORAL_CAPSULE | Freq: Every day | ORAL | 2 refills | Status: AC

## 2023-06-25 MED ORDER — PHENYLEPHRINE 80 MCG/ML (10ML) SYRINGE FOR IV PUSH (FOR BLOOD PRESSURE SUPPORT)
PREFILLED_SYRINGE | INTRAVENOUS | Status: AC
  Filled 2023-06-25: qty 10

## 2023-06-25 MED ORDER — PHENYLEPHRINE HCL (PRESSORS) 10 MG/ML IV SOLN
INTRAVENOUS | Status: DC | PRN
  Administered 2023-06-25: 80 ug via INTRAVENOUS

## 2023-06-25 NOTE — Anesthesia Postprocedure Evaluation (Signed)
Anesthesia Post Note  Patient: Karen Edwards  Procedure(s) Performed: COLONOSCOPY WITH PROPOFOL  Patient location during evaluation: PACU Anesthesia Type: General Level of consciousness: awake and alert, oriented and patient cooperative Pain management: pain level controlled Vital Signs Assessment: post-procedure vital signs reviewed and stable Respiratory status: spontaneous breathing, nonlabored ventilation and respiratory function stable Cardiovascular status: blood pressure returned to baseline and stable Postop Assessment: adequate PO intake Anesthetic complications: no   No notable events documented.   Last Vitals:  Vitals:   06/25/23 1126 06/25/23 1129  BP: 138/74   Pulse: 76 79  Resp: 20 15  Temp:    SpO2: 99% 100%    Last Pain:  Vitals:   06/25/23 1030  TempSrc: Temporal  PainSc: 0-No pain                 Reed Breech

## 2023-06-25 NOTE — Transfer of Care (Signed)
Immediate Anesthesia Transfer of Care Note  Patient: Karen Edwards  Procedure(s) Performed: COLONOSCOPY WITH PROPOFOL  Patient Location: Endoscopy Unit  Anesthesia Type:General  Level of Consciousness: awake, alert , and oriented  Airway & Oxygen Therapy: Patient Spontanous Breathing  Post-op Assessment: Report given to RN and Post -op Vital signs reviewed and stable  Post vital signs: Reviewed and stable  Last Vitals:  Vitals Value Taken Time  BP 127/71 06/25/23 1116  Temp 35.8 1116  Pulse 80 06/25/23 1116  Resp 21 06/25/23 1116  SpO2 100 % 06/25/23 1116    Last Pain:  Vitals:   06/25/23 1030  TempSrc: Temporal  PainSc: 0-No pain         Complications: No notable events documented.

## 2023-06-25 NOTE — H&P (Signed)
Arlyss Repress, MD [address]  [address]  [place], [place] [address]  Main: [phone]  Fax: [phone] Pager: [phone]  Primary Care Physician:  Melburn Popper, FNP Primary Gastroenterologist:  Dr. Arlyss Repress  Pre-Procedure History & Physical: HPI:  Karen Edwards is a 57 y.o. female is here for an colonoscopy.   Past Medical History:  Diagnosis Date   Anxiety    Chronic back pain    Degenerative joint disease    Depression    Headache    neck pain   Hypotension    IBS (irritable bowel syndrome) 05/2015   MVP (mitral valve prolapse)    patient denies   Nausea and vomiting     Past Surgical History:  Procedure Laterality Date   ANKLE SURGERY Right 03/02/2014   repair of tendon and sural nerve right ankle   CERVICAL SPINE SURGERY  02/21/2016   arthrodesis of anterior cervical spine (fusion of C5-6,C6-7 and insertion of bone allograft   CESAREAN SECTION     CESAREAN SECTION WITH BILATERAL TUBAL LIGATION  1990   COLONOSCOPY  04/19/2015   COLONOSCOPY WITH PROPOFOL N/A 05/18/2018   Procedure: COLONOSCOPY WITH PROPOFOL;  Surgeon: Toney Reil, MD;  Location: [facility] ENDOSCOPY;  Service: Gastroenterology;  Laterality: N/A;   ESOPHAGOGASTRODUODENOSCOPY (EGD) WITH PROPOFOL N/A 05/18/2018   Procedure: ESOPHAGOGASTRODUODENOSCOPY (EGD) WITH PROPOFOL;  Surgeon: Toney Reil, MD;  Location: [facility] ENDOSCOPY;  Service: Gastroenterology;  Laterality: N/A;   HEMORRHOID SURGERY N/A 06/16/2015   Procedure: HEMORRHOIDECTOMY;  Surgeon: Nadeen Landau, MD;  Location: [facility] ORS;  Service: General;  Laterality: N/A;   HYSTEROSCOPY WITH D & C  12/24/2013   LUMBAR SPINAL CORD SIMULATOR REVISION N/A 03/14/2020   Procedure: LUMBAR SPINAL CORD SIMULATOR REVISION;  Surgeon: Delano Metz, MD;  Location: [facility] ORS;  Service: Neurosurgery;  Laterality: N/A;   SPINAL CORD STIMULATOR INSERTION N/A 06/18/2018   Procedure: LUMBAR SPINAL CORD STIMULATOR INSERTION;   Surgeon: Delano Metz, MD;  Location: [facility] ORS;  Service: Neurosurgery;  Laterality: N/A;   TUBAL LIGATION      Prior to Admission medications   Medication Sig Start Date End Date Taking? Authorizing Provider  FLUoxetine (PROZAC) 20 MG capsule Take 3 capsules (60 mg total) by mouth daily. For depression. Office visit required for further refills. 02/27/22  Yes Doreene Nest, NP  QUEtiapine (SEROQUEL) 100 MG tablet Take 100 mg by mouth at bedtime.   Yes [provider]  acetaminophen (TYLENOL) 500 MG tablet Take 1,000 mg by mouth every 6 (six) hours as needed (for pain.).     [provider]  lubiprostone (AMITIZA) 24 MCG capsule Take 1 capsule (24 mcg total) by mouth 2 (two) times daily with a meal. Swallow the medication whole. Do not break or chew. 05/07/23 08/05/23  Delano Metz, MD  naloxone [facility]) nasal spray 4 mg/0.1 mL Place 1 spray into the nose as needed for up to 365 doses (for opioid-induced respiratory depresssion). In case of emergency (overdose), spray once into each nostril. If no response within 3 minutes, repeat application and call 911. 09/11/22 09/11/23  Delano Metz, MD  ondansetron (ZOFRAN-ODT) 4 MG disintegrating tablet Take 4 mg by mouth every 4 (four) hours as needed (vertigo/dizziness).  02/03/20   [provider]  oxyCODONE (OXY IR/ROXICODONE) 5 MG immediate release tablet Take 1 tablet (5 mg total) by mouth 2 (two) times daily as needed for severe pain. Must last 30 days. 05/09/23 06/08/23  Delano Metz, MD  oxyCODONE (OXY IR/ROXICODONE) 5 MG immediate release tablet Take 1 tablet (5 mg total) by mouth 2 (two) times daily as needed for severe pain. Must last 30 days. 06/08/23 07/08/23  Delano Metz, MD  oxyCODONE (OXY IR/ROXICODONE) 5 MG immediate release tablet Take 1 tablet (5 mg total) by mouth 2 (two) times daily as needed for severe pain. Must last 30 days. 07/08/23 08/07/23  Delano Metz, MD  scopolamine  (TRANSDERM-SCOP) 1 MG/3DAYS Place 1 patch onto the skin daily as needed (vertigo/dizziness). 02/03/20   [provider]    Allergies as of 05/20/2023 - Review Complete 05/12/2023  Allergen Reaction Noted   Gabapentin  08/13/2021    Family History  Problem Relation Age of Onset   Arthritis Mother    Depression Mother    Hyperlipidemia Mother    Hypertension Mother    Kidney disease Mother    Vision loss Mother    Hypothyroidism Mother    Hearing loss Father    Hyperlipidemia Father    Hypertension Father    Diabetes Mellitus I Maternal Grandmother    Stroke Maternal Grandmother    Congestive Heart Failure Maternal Grandfather    Heart attack Maternal Grandfather    Prostate cancer Paternal Grandfather    Thyroid disease Maternal Aunt    Diabetes Maternal Aunt    Gout Maternal Aunt     Social History   Socioeconomic History   Marital status: Widowed    Spouse name: Not on file   Number of children: 2   Years of education: Not on file   Highest education level: Not on file  Occupational History   Occupation: disabled  Tobacco Use   Smoking status: Every Day    Current packs/day: 0.50    Average packs/day: 0.5 packs/day for 37.0 years (18.5 ttl pk-yrs)    Types: Cigarettes   Smokeless tobacco: Never  Vaping Use   Vaping status: Never Used  Substance and Sexual Activity   Alcohol use: Yes    Alcohol/week: 1.0 standard drink of alcohol    Types: 1 Cans of beer per week    Comment: drinks an occasional beer   Drug use: No   Sexual activity: Yes    Birth control/protection: None, Surgical    Comment: tubal ligation  Other Topics Concern   Not on file  Social History Narrative   Married.   2 children. One boy and one girl.   On disability.   Playing with her dogs, gardening.   Social Determinants of Health   Financial Resource Strain: Low Risk  (10/13/2017)   Overall Financial Resource Strain (CARDIA)    Difficulty of Paying Living Expenses: Not hard  at all  Food Insecurity: Unknown (10/13/2017)   Hunger Vital Sign    Worried About Running Out of Food in the Last Year: Patient declined    Ran Out of Food in the Last Year: Patient declined  Transportation Needs: Unknown (10/13/2017)   PRAPARE - Administrator, Civil Service (Medical): Patient declined    Lack of Transportation (Non-Medical): Patient declined  Physical Activity: Unknown (10/13/2017)   Exercise Vital Sign    Days of Exercise per Week: Patient declined    Minutes of Exercise per Session: Patient declined  Stress: No Stress Concern Present (10/13/2017)   [employer] of Occupational Health - Occupational Stress Questionnaire    Feeling of Stress : Not at all  Social Connections: Unknown (10/13/2017)   Social Connection and Isolation Panel [NHANES]  Frequency of Communication with Friends and Family: Patient declined    Frequency of Social Gatherings with Friends and Family: Patient declined    Attends Religious Services: Patient declined    Database administrator or Organizations: Patient declined    Attends Banker Meetings: Patient declined    Marital Status: Patient declined  Intimate Partner Violence: Unknown (10/13/2017)   Humiliation, Afraid, Rape, and Kick questionnaire    Fear of Current or Ex-Partner: Patient declined    Emotionally Abused: Patient declined    Physically Abused: Patient declined    Sexually Abused: Patient declined    Review of Systems: See HPI, otherwise negative ROS  Physical Exam: BP (!) 142/81   Pulse 77   Temp (!) 97.2 F (36.2 C) (Temporal)   Resp 16   Wt 79.4 kg   LMP 05/05/2017 (Exact Date)   SpO2 98%   BMI 33.07 kg/m  General:   Alert,  pleasant and cooperative in NAD Head:  Normocephalic and atraumatic. Neck:  Supple; no masses or thyromegaly. Lungs:  Clear throughout to auscultation.    Heart:  Regular rate and rhythm. Abdomen:  Soft, nontender and nondistended. Normal bowel sounds,  without guarding, and without rebound.   Neurologic:  Alert and  oriented x4;  grossly normal neurologically.  Impression/Plan: AYRABELLA VINK is here for an colonoscopy to be performed for h/o colon adenomas  Risks, benefits, limitations, and alternatives regarding  colonoscopy have been reviewed with the patient.  Questions have been answered.  All parties agreeable.   Lannette Donath, MD  06/25/2023, 10:41 AM

## 2023-06-25 NOTE — Anesthesia Preprocedure Evaluation (Addendum)
Anesthesia Evaluation  Patient identified by MRN, date of birth, ID band Patient awake    Reviewed: Allergy & Precautions, NPO status , Patient's Chart, lab work & pertinent test results  History of Anesthesia Complications Negative for: history of anesthetic complications  Airway Mallampati: IV   Neck ROM: Full    Dental  (+) Partial Lower, Partial Upper   Pulmonary Current Smoker (1/2 ppd)Patient did not abstain from smoking.   Pulmonary exam normal breath sounds clear to auscultation       Cardiovascular Exercise Tolerance: Good Normal cardiovascular exam+ Valvular Problems/Murmurs MVP  Rhythm:Regular Rate:Normal     Neuro/Psych  Headaches PSYCHIATRIC DISORDERS Anxiety Depression    Chronic pain    GI/Hepatic ,GERD  ,,  Endo/Other  negative endocrine ROS    Renal/GU negative Renal ROS     Musculoskeletal   Abdominal   Peds  Hematology negative hematology ROS (+)   Anesthesia Other Findings   Reproductive/Obstetrics                             Anesthesia Physical Anesthesia Plan  ASA: 2  Anesthesia Plan: General   Post-op Pain Management:    Induction: Intravenous  PONV Risk Score and Plan: 2 and Propofol infusion, TIVA and Treatment may vary due to age or medical condition  Airway Management Planned: Natural Airway  Additional Equipment:   Intra-op Plan:   Post-operative Plan:   Informed Consent: I have reviewed the patients History and Physical, chart, labs and discussed the procedure including the risks, benefits and alternatives for the proposed anesthesia with the patient or authorized representative who has indicated his/her understanding and acceptance.       Plan Discussed with: CRNA  Anesthesia Plan Comments: (LMA/GETA backup discussed.  Patient consented for risks of anesthesia including but not limited to:  - adverse reactions to medications - damage to  eyes, teeth, lips or other oral mucosa - nerve damage due to positioning  - sore throat or hoarseness - damage to heart, brain, nerves, lungs, other parts of body or loss of life  Informed patient about role of CRNA in peri- and intra-operative care.  Patient voiced understanding.)       Anesthesia Quick Evaluation

## 2023-06-25 NOTE — Op Note (Signed)
[facility] Gastroenterology Patient Name: Karen Edwards Procedure Date: 06/25/2023 10:46 AM MRN: [id number] Account #: [id number] Date of Birth: May 28, 1966 Admit Type: Outpatient Age: 57 Room: [facility] ENDO ROOM 4 Gender: Female Note Status: Finalized Instrument Name: Prentice Docker [id number] Procedure:             Colonoscopy Indications:           Surveillance: Personal history of adenomatous polyps                         on last colonoscopy 5 years ago, Last colonoscopy:                         July 2019 Providers:             Toney Reil MD, MD Referring MD:          Courtney Paris. Tiburcio Pea (Referring MD) Medicines:             General Anesthesia Complications:         No immediate complications. Estimated blood loss: None. Procedure:             Pre-Anesthesia Assessment:                        - Prior to the procedure, a History and Physical was                         performed, and patient medications and allergies were                         reviewed. The patient is competent. The risks and                         benefits of the procedure and the sedation options and                         risks were discussed with the patient. All questions                         were answered and informed consent was obtained.                         Patient identification and proposed procedure were                         verified by the physician, the nurse, the                         anesthesiologist, the anesthetist and the technician                         in the pre-procedure area in the procedure room in the                         endoscopy suite. Mental Status Examination: alert and                         oriented. Airway Examination: normal oropharyngeal  airway and neck mobility. Respiratory Examination:                         clear to auscultation. CV Examination: normal.                         Prophylactic Antibiotics: The  patient does not require                         prophylactic antibiotics. Prior Anticoagulants: The                         patient has taken no anticoagulant or antiplatelet                         agents. ASA Grade Assessment: III - A patient with                         severe systemic disease. After reviewing the risks and                         benefits, the patient was deemed in satisfactory                         condition to undergo the procedure. The anesthesia                         plan was to use general anesthesia. Immediately prior                         to administration of medications, the patient was                         re-assessed for adequacy to receive sedatives. The                         heart rate, respiratory rate, oxygen saturations,                         blood pressure, adequacy of pulmonary ventilation, and                         response to care were monitored throughout the                         procedure. The physical status of the patient was                         re-assessed after the procedure.                        After obtaining informed consent, the colonoscope was                         passed under direct vision. Throughout the procedure,                         the patient's blood pressure, pulse, and oxygen  saturations were monitored continuously. The                         Colonoscope was introduced through the anus with the                         intention of advancing to the cecum. The scope was                         advanced to the sigmoid colon before the procedure was                         aborted. Medications were given. The colonoscopy was                         extremely difficult due to inadequate bowel prep. The                         patient tolerated the procedure well. The quality of                         the bowel preparation was poor. The colonoscopy was                          aborted due to the difficulty of the procedure. Findings:      The perianal and digital rectal examinations were normal. Pertinent       negatives include normal sphincter tone and no palpable rectal lesions.      Copious quantities of semi-solid stool was found in the sigmoid colon,       precluding visualization.      The retroflexed view of the distal rectum and anal verge was normal and       showed no anal or rectal abnormalities. Impression:            - Preparation of the colon was poor.                        - The procedure was aborted due to the difficulty of                         the procedure.                        - Stool in the sigmoid colon.                        - The distal rectum and anal verge are normal on                         retroflexion view.                        - No specimens collected. Recommendation:        - Discharge patient to home (with escort).                        - Resume previous diet today.                        -  Continue present medications.                        - Repeat colonoscopy at next available appointment                         (within 3 months) with 2 day prep because the bowel                         preparation was poor.                        - Start linzess daily for severe constipation Procedure Code(s):     --- Professional ---                        G0105, 53, Colorectal cancer screening; colonoscopy on                         individual at high risk Diagnosis Code(s):     --- Professional ---                        Z86.010, Personal history of colonic polyps CPT copyright 2022 American Medical Association. All rights reserved. The codes documented in this report are preliminary and upon coder review may  be revised to meet current compliance requirements. Dr. Libby Maw Toney Reil MD, MD 06/25/2023 11:19:38 AM This report has been signed electronically. Number of Addenda: 0 Note Initiated On:  06/25/2023 10:46 AM Total Procedure Duration: 0 hours 4 minutes 2 seconds  Estimated Blood Loss:  Estimated blood loss: none.      [facility]

## 2023-06-26 ENCOUNTER — Other Ambulatory Visit: Payer: Self-pay

## 2023-06-26 ENCOUNTER — Telehealth: Payer: Self-pay | Admitting: Gastroenterology

## 2023-06-26 ENCOUNTER — Encounter: Payer: Self-pay | Admitting: Gastroenterology

## 2023-06-26 DIAGNOSIS — Z86010 Personal history of colonic polyps: Secondary | ICD-10-CM

## 2023-06-26 MED ORDER — NA SULFATE-K SULFATE-MG SULF 17.5-3.13-1.6 GM/177ML PO SOLN: 354.0000 mL | Freq: Once | ORAL | 0 refills | Status: AC

## 2023-06-26 MED ORDER — ONDANSETRON HCL 4 MG PO TABS: 4.0000 mg | ORAL_TABLET | Freq: Three times a day (TID) | ORAL | 0 refills | Status: DC | PRN

## 2023-06-26 NOTE — Telephone Encounter (Signed)
Offered patient 07/22/2023 or 07/24/2023 in [place] and she states she is not going to [place] only [place]. Made her colonoscopy for 08/07/2023 with Dr. Allegra Lai. Went over instructions, mailed them, sent to [employer]. Sent prep to the pharmacy

## 2023-06-26 NOTE — Addendum Note (Signed)
Addended by: Radene Knee L on: 06/26/2023 03:02 PM   Modules accepted: Orders

## 2023-06-26 NOTE — Telephone Encounter (Signed)
The patient called in to schedule her second procedure. The patient said that the nurse can put her any where as soon as possible just let her know the date.

## 2023-07-16 ENCOUNTER — Ambulatory Visit: Payer: Medicare PPO | Admitting: Gastroenterology

## 2023-07-25 NOTE — Patient Instructions (Incomplete)
____________________________________________________________________________________________  Opioid Pain Medication Update  To: All patients taking opioid pain medications. (I.e.: hydrocodone, hydromorphone, oxycodone, oxymorphone, morphine, codeine, methadone, tapentadol, tramadol, buprenorphine, fentanyl, etc.)  Re: Updated review of side effects and adverse reactions of opioid analgesics, as well as new information about long term effects of this class of medications.  Direct risks of long-term opioid therapy are not limited to opioid addiction and overdose. Potential medical risks include serious fractures, breathing problems during sleep, hyperalgesia, immunosuppression, chronic constipation, bowel obstruction, myocardial infarction, and tooth decay secondary to xerostomia.  Unpredictable adverse effects that can occur even if you take your medication correctly: Cognitive impairment, respiratory depression, and death. Most people think that if they take their medication "correctly", and "as instructed", that they will be safe. Nothing could be farther from the truth. In reality, a significant amount of recorded deaths associated with the use of opioids has occurred in individuals that had taken the medication for a long time, and were taking their medication correctly. The following are examples of how this can happen: Patient taking his/her medication for a long time, as instructed, without any side effects, is given a certain antibiotic or another unrelated medication, which in turn triggers a "Drug-to-drug interaction" leading to disorientation, cognitive impairment, impaired reflexes, respiratory depression or an untoward event leading to serious bodily harm or injury, including death.  Patient taking his/her medication for a long time, as instructed, without any side effects, develops an acute impairment of liver and/or kidney function. This will lead to a rapid inability of the body to  breakdown and eliminate their pain medication, which will result in effects similar to an "overdose", but with the same medicine and dose that they had always taken. This again may lead to disorientation, cognitive impairment, impaired reflexes, respiratory depression or an untoward event leading to serious bodily harm or injury, including death.  A similar problem will occur with patients as they grow older and their liver and kidney function begins to decrease as part of the aging process.  Background information: Historically, the original case for using long-term opioid therapy to treat chronic noncancer pain was based on safety assumptions that subsequent experience has called into question. In 1996, the American Pain Society and the American Academy of Pain Medicine issued a consensus statement supporting long-term opioid therapy. This statement acknowledged the dangers of opioid prescribing but concluded that the risk for addiction was low; respiratory depression induced by opioids was short-lived, occurred mainly in opioid-naive patients, and was antagonized by pain; tolerance was not a common problem; and efforts to control diversion should not constrain opioid prescribing. This has now proven to be wrong. Experience regarding the risks for opioid addiction, misuse, and overdose in community practice has failed to support these assumptions.  According to the Centers for Disease Control and Prevention, fatal overdoses involving opioid analgesics have increased sharply over the past decade. Currently, more than 96,700 people die from drug overdoses every year. Opioids are a factor in 7 out of every 10 overdose deaths. Deaths from drug overdose have surpassed motor vehicle accidents as the leading cause of death for individuals between the ages of 80 and 61.  Clinical data suggest that neuroendocrine dysfunction may be very common in both men and women, potentially causing hypogonadism, erectile  dysfunction, infertility, decreased libido, osteoporosis, and depression. Recent studies linked higher opioid dose to increased opioid-related mortality. Controlled observational studies reported that long-term opioid therapy may be associated with increased risk for cardiovascular events. Subsequent meta-analysis concluded  that the safety of long-term opioid therapy in elderly patients has not been proven.   Side Effects and adverse reactions: Common side effects: Drowsiness (sedation). Dizziness. Nausea and vomiting. Constipation. Physical dependence -- Dependence often manifests with withdrawal symptoms when opioids are discontinued or decreased. Tolerance -- As you take repeated doses of opioids, you require increased medication to experience the same effect of pain relief. Respiratory depression -- This can occur in healthy people, especially with higher doses. However, people with COPD, asthma or other lung conditions may be even more susceptible to fatal respiratory impairment.  Uncommon side effects: An increased sensitivity to feeling pain and extreme response to pain (hyperalgesia). Chronic use of opioids can lead to this. Delayed gastric emptying (the process by which the contents of your stomach are moved into your small intestine). Muscle rigidity. Immune system and hormonal dysfunction. Quick, involuntary muscle jerks (myoclonus). Arrhythmia. Itchy skin (pruritus). Dry mouth (xerostomia).  Long-term side effects: Chronic constipation. Sleep-disordered breathing (SDB). Increased risk of bone fractures. Hypothalamic-pituitary-adrenal dysregulation. Increased risk of overdose.  RISKS: Respiratory depression and death: Opioids increase the risk of respiratory depression and death.  Drug-to-drug interactions: Opioids are relatively contraindicated in combination with benzodiazepines, sleep inducers, and other central nervous system depressants. Other classes of medications  (i.e.: certain antibiotics and even over-the-counter medications) may also trigger or induce respiratory depression in some patients.  Medical conditions: Patients with pre-existing respiratory problems are at higher risk of respiratory failure and/or depression when in combination with opioid analgesics. Opioids are relatively contraindicated in some medical conditions such as central sleep apnea.   Fractures and Falls:  Opioids increase the risk and incidence of falls. This is of particular importance in elderly patients.  Endocrine System:  Long-term administration is associated with endocrine abnormalities (endocrinopathies). (Also known as Opioid-induced Endocrinopathy) Influences on both the hypothalamic-pituitary-adrenal axis?and the hypothalamic-pituitary-gonadal axis have been demonstrated with consequent hypogonadism and adrenal insufficiency in both sexes. Hypogonadism and decreased levels of dehydroepiandrosterone sulfate have been reported in men and women. Endocrine effects include: Amenorrhoea in women (abnormal absence of menstruation) Reduced libido in both sexes Decreased sexual function Erectile dysfunction in men Hypogonadisms (decreased testicular function with shrinkage of testicles) Infertility Depression and fatigue Loss of muscle mass Anxiety Depression Immune suppression Hyperalgesia Weight gain Anemia Osteoporosis Patients (particularly women of childbearing age) should avoid opioids. There is insufficient evidence to recommend routine monitoring of asymptomatic patients taking opioids in the long-term for hormonal deficiencies.  Immune System: Human studies have demonstrated that opioids have an immunomodulating effect. These effects are mediated via opioid receptors both on immune effector cells and in the central nervous system. Opioids have been demonstrated to have adverse effects on antimicrobial response and anti-tumour surveillance. Buprenorphine has  been demonstrated to have no impact on immune function.  Opioid Induced Hyperalgesia: Human studies have demonstrated that prolonged use of opioids can lead to a state of abnormal pain sensitivity, sometimes called opioid induced hyperalgesia (OIH). Opioid induced hyperalgesia is not usually seen in the absence of tolerance to opioid analgesia. Clinically, hyperalgesia may be diagnosed if the patient on long-term opioid therapy presents with increased pain. This might be qualitatively and anatomically distinct from pain related to disease progression or to breakthrough pain resulting from development of opioid tolerance. Pain associated with hyperalgesia tends to be more diffuse than the pre-existing pain and less defined in quality. Management of opioid induced hyperalgesia requires opioid dose reduction.  Cancer: Chronic opioid therapy has been associated with an increased risk of cancer  among noncancer patients with chronic pain. This association was more evident in chronic strong opioid users. Chronic opioid consumption causes significant pathological changes in the small intestine and colon. Epidemiological studies have found that there is a link between opium dependence and initiation of gastrointestinal cancers. Cancer is the second leading cause of death after cardiovascular disease. Chronic use of opioids can cause multiple conditions such as GERD, immunosuppression and renal damage as well as carcinogenic effects, which are associated with the incidence of cancers.   Mortality: Long-term opioid use has been associated with increased mortality among patients with chronic non-cancer pain (CNCP).  Prescription of long-acting opioids for chronic noncancer pain was associated with a significantly increased risk of all-cause mortality, including deaths from causes other than overdose.  Reference: Von Korff M, Kolodny A, Deyo RA, Chou R. Long-term opioid therapy reconsidered. Ann Intern Med. 2011  Sep 6;155(5):325-8. doi: 10.7326/[id number]. PMID: 64403474; PMCID: [id number]. Randon Goldsmith, Hayward RA, Dunn KM, [place] KP. Risk of adverse events in patients prescribed long-term opioids: A cohort study in the [place] [facility] Research Datalink. Eur J Pain. 2019 May;23(5):908-922. doi: 10.1002/ejp.1357. Epub 2019 Jan 31. PMID: 43329518. Colameco S, Coren JS, Ciervo CA. Continuous opioid treatment for chronic noncancer pain: a time for moderation in prescribing. Postgrad Med. 2009 Jul;121(4):61-6. doi: 10.3810/pgm.2009.07.2032. PMID: 84166063. William Hamburger RN, [place] SD, Blazina I, Cristopher Peru, Bougatsos C, Deyo RA. The effectiveness and risks of long-term opioid therapy for chronic pain: a systematic review for a [employer] of [employer] Pathways to [employer]. Ann Intern Med. 2015 Feb 17;162(4):276-86. doi: 10.7326/M14-2559. PMID: 01601093. Caryl Bis [facility], Makuc DM. NCHS Data Brief No. 22. [place]: Centers for Disease Control and Prevention; 2009. Sep, Increase in Fatal Poisonings Involving Opioid Analgesics in the [place], 1999-2006. Song IA, Choi HR, Oh TK. Long-term opioid use and mortality in patients with chronic non-cancer pain: Ten-year follow-up study in [place] from 2010 through 2019. EClinicalMedicine. 2022 Jul 18;51:101558. doi: 10.1016/j.eclinm.2022.235573. PMID: 22025427; PMCID: [id number]. Huser, W., Schubert, T., Vogelmann, T. et al. All-cause mortality in patients with long-term opioid therapy compared with non-opioid analgesics for chronic non-cancer pain: a database study. BMC Med 18, 162 (2020). http://lester.info/ Rashidian H, Karie Kirks, Malekzadeh R, Haghdoost AA. An Ecological Study of the Association between Opiate Use and Incidence of Cancers. Addict Health. 2016 Fall;8(4):252-260. PMID: 51761607; PMCID: [id number].  Our Goal: Our goal is to control your  pain with means other than the use of opioid pain medications.  Our Recommendation: Talk to your physician about coming off of these medications. We can assist you with the tapering down and stopping these medicines. Based on the new information, even if you cannot completely stop the medication, a decrease in the dose may be associated with a lesser risk. Ask for other means of controlling the pain. Decrease or eliminate those factors that significantly contribute to your pain such as smoking, obesity, and a diet heavily tilted towards "inflammatory" nutrients.  Last Updated: 05/05/2023   ____________________________________________________________________________________________     ____________________________________________________________________________________________  National Pain Medication Shortage  The [place] is experiencing worsening drug shortages. These have had a negative widespread effect on patient care and treatment. Not expected to improve any time soon. Predicted to last past 2029.   Drug shortage list (generic names) Oxycodone IR Oxycodone/APAP Oxymorphone IR Hydromorphone Hydrocodone/APAP Morphine  Where is the problem?  Manufacturing and supply level.  Will this shortage affect you?  Only if you  take any of the above pain medications.  How? You may be unable to fill your prescription.  Your pharmacist may offer a "partial fill" of your prescription. (Warning: Do not accept partial fills.) Prescriptions partially filled cannot be transferred to another pharmacy. Read our Medication Rules and Regulation. Depending on how much medicine you are dependent on, you may experience withdrawals when unable to get the medication.  Recommendations: Consider ending your dependence on opioid pain medications. Ask your pain specialist to assist you with the process. Consider switching to a medication currently not in shortage, such as Buprenorphine. Talk to your pain  specialist about this option. Consider decreasing your pain medication requirements by managing tolerance thru "Drug Holidays". This may help minimize withdrawals, should you run out of medicine. Control your pain thru the use of non-pharmacological interventional therapies.   Your prescriber: Prescribers cannot be blamed for shortages. Medication manufacturing and supply issues cannot be fixed by the prescriber.   NOTE: The prescriber is not responsible for supplying the medication, or solving supply issues. Work with your pharmacist to solve it. The patient is responsible for the decision to take or continue taking the medication and for identifying and securing a legal supply source. By law, supplying the medication is the job and responsibility of the pharmacy. The prescriber is responsible for the evaluation, monitoring, and prescribing of these medications.   Prescribers will NOT: Re-issue prescriptions that have been partially filled. Re-issue prescriptions already sent to a pharmacy.  Re-send prescriptions to a different pharmacy because yours did not have your medication. Ask pharmacist to order more medicine or transfer the prescription to another pharmacy. (Read below.)  New 2023 regulation: "June 28, 2022 Revised Regulation Allows DEA-Registered Pharmacies to Transfer Electronic Prescriptions at a Patient's Request DEA Headquarters Division - Public Information Office Patients now have the ability to request their electronic prescription be transferred to another pharmacy without having to go back to their practitioner to initiate the request. This revised regulation went into effect on Monday, June 24, 2022.     At a patient's request, a DEA-registered retail pharmacy can now transfer an electronic prescription for a controlled substance (schedules II-V) to another DEA-registered retail pharmacy. Prior to this change, patients would have to go through their practitioner to  cancel their prescription and have it re-issued to a different pharmacy. The process was taxing and time consuming for both patients and practitioners.    The Drug Enforcement Administration [facility]) published its intent to revise the process for transferring electronic prescriptions on September 15, 2020.  The final rule was published in the federal register on May 23, 2022 and went into effect 30 days later.  Under the final rule, a prescription can only be transferred once between pharmacies, and only if allowed under existing state or other applicable law. The prescription must remain in its electronic form; may not be altered in any way; and the transfer must be communicated directly between two licensed pharmacists. It's important to note, any authorized refills transfer with the original prescription, which means the entire prescription will be filled at the same pharmacy".  Reference: [email or web address].is [facility] website announcement)  [email or web address] [employer] of Justice)   [employer] / Vol. 88, No. 143 / Thursday, May 23, 2022 / Rules and Regulations DEPARTMENT OF JUSTICE  Drug Enforcement Administration  21 CFR Part 1306  [Docket No. [id number]]  RIN [id number] Transfer of Electronic Prescriptions for Schedules II-V Controlled Substances Between Pharmacies for Initial Filling  ____________________________________________________________________________________________  ____________________________________________________________________________________________  Transfer of Pain Medication between Pharmacies  Re: 2023 DEA Clarification on existing regulation  Published on DEA Website: June 28, 2022  Title: Revised Regulation Allows DEA-Registered Pharmacies to Electrical engineer Prescriptions at a Patient's  Request DEA Headquarters Division - [employer]  "Patients now have the ability to request their electronic prescription be transferred to another pharmacy without having to go back to their practitioner to initiate the request. This revised regulation went into effect on Monday, June 24, 2022.     At a patient's request, a DEA-registered retail pharmacy can now transfer an electronic prescription for a controlled substance (schedules II-V) to another DEA-registered retail pharmacy. Prior to this change, patients would have to go through their practitioner to cancel their prescription and have it re-issued to a different pharmacy. The process was taxing and time consuming for both patients and practitioners.    The Drug Enforcement Administration [facility]) published its intent to revise the process for transferring electronic prescriptions on September 15, 2020.  The final rule was published in the federal register on May 23, 2022 and went into effect 30 days later.  Under the final rule, a prescription can only be transferred once between pharmacies, and only if allowed under existing state or other applicable law. The prescription must remain in its electronic form; may not be altered in any way; and the transfer must be communicated directly between two licensed pharmacists. It's important to note, any authorized refills transfer with the original prescription, which means the entire prescription will be filled at the same pharmacy."    REFERENCES: 1. DEA website announcement [email or web address]  2. Department of Justice website  [email or web address]  3. DEPARTMENT OF JUSTICE Drug Enforcement Administration 21 CFR Part 1306 [Docket No. [id number]] RIN [id number] "Transfer of Electronic Prescriptions for Schedules II-V Controlled Substances  Between Pharmacies for Initial Filling"  ____________________________________________________________________________________________     _______________________________________________________________________  Medication Rules  Purpose: To inform patients, and their family members, of our medication rules and regulations.  Applies to: All patients receiving prescriptions from our practice (written or electronic).  Pharmacy of record: This is the pharmacy where your electronic prescriptions will be sent. Make sure we have the correct one.  Electronic prescriptions: In compliance with the [facility] Strengthen Opioid Misuse Prevention (STOP) Act of 2017 (Session Conni Elliot [phone]), effective October 28, 2018, all controlled substances must be electronically prescribed. Written prescriptions, faxing, or calling prescriptions to a pharmacy will no longer be done.  Prescription refills: These will be provided only during in-person appointments. No medications will be renewed without a "face-to-face" evaluation with your provider. Applies to all prescriptions.  NOTE: The following applies primarily to controlled substances (Opioid* Pain Medications).   Type of encounter (visit): For patients receiving controlled substances, face-to-face visits are required. (Not an option and not up to the patient.)  Patient's responsibilities: Pain Pills: Bring all pain pills to every appointment (except for procedure appointments). Pill Bottles: Bring pills in original pharmacy bottle. Bring bottle, even if empty. Always bring the bottle of the most recent fill.  Medication refills: You are responsible for knowing and keeping track of what medications you are taking and when is it that you will need a refill. The day before your appointment: write a list of all prescriptions that need to be refilled. The day of the appointment: give the list to the admitting nurse. Prescriptions will be written only  during appointments. No prescriptions will be written on procedure days. If you forget a  medication: it will not be "Called in", "Faxed", or "electronically sent". You will need to get another appointment to get these prescribed. No early refills. Do not call asking to have your prescription filled early. Partial  or short prescriptions: Occasionally your pharmacy may not have enough pills to fill your prescription.  NEVER ACCEPT a partial fill or a prescription that is short of the total amount of pills that you were prescribed.  With controlled substances the law allows 72 hours for the pharmacy to complete the prescription.  If the prescription is not completed within 72 hours, the pharmacist will require a new prescription to be written. This means that you will be short on your medicine and we WILL NOT send another prescription to complete your original prescription.  Instead, request the pharmacy to send a carrier to a nearby branch to get enough medication to provide you with your full prescription. Prescription Accuracy: You are responsible for carefully inspecting your prescriptions before leaving our office. Have the discharge nurse carefully go over each prescription with you, before taking them home. Make sure that your name is accurately spelled, that your address is correct. Check the name and dose of your medication to make sure it is accurate. Check the number of pills, and the written instructions to make sure they are clear and accurate. Make sure that you are given enough medication to last until your next medication refill appointment. Taking Medication: Take medication as prescribed. When it comes to controlled substances, taking less pills or less frequently than prescribed is permitted and encouraged. Never take more pills than instructed. Never take the medication more frequently than prescribed.  Inform other Doctors: Always inform, all of your healthcare providers, of all the  medications you take. Pain Medication from other Providers: You are not allowed to accept any additional pain medication from any other Doctor or Healthcare provider. There are two exceptions to this rule. (see below) In the event that you require additional pain medication, you are responsible for notifying us, as stated below. Cough Medicine: Often these contain an opioid, such as codeine or hydrocodone. Never accept or take cough medicine containing these opioids if you are already taking an opioid* medication. The combination may cause respiratory failure and death. Medication Agreement: You are responsible for carefully reading and following our Medication Agreement. This must be signed before receiving any prescriptions from our practice. Safely store a copy of your signed Agreement. Violations to the Agreement will result in no further prescriptions. (Additional copies of our Medication Agreement are available upon request.) Laws, Rules, & Regulations: All patients are expected to follow all [address] and [employer], [employer], Rules, [place] [employer]. Ignorance of the Laws does not constitute a valid excuse.  Illegal drugs and Controlled Substances: The use of illegal substances (including, but not limited to marijuana and its derivatives) and/or the illegal use of any controlled substances is strictly prohibited. Violation of this rule may result in the immediate and permanent discontinuation of any and all prescriptions being written by our practice. The use of any illegal substances is prohibited. Adopted CDC guidelines & recommendations: Target dosing levels will be at or below 60 MME/day. Use of benzodiazepines** is not recommended.  Exceptions: There are only two exceptions to the rule of not receiving pain medications from other Healthcare Providers. Exception #1 (Emergencies): In the event of an emergency (i.e.: accident requiring emergency care), you are allowed to receive additional pain  medication. However, you are responsible for: As soon as  you are able, call our office [phone], at any time of the day or night, and leave a message stating your name, the date and nature of the emergency, and the name and dose of the medication prescribed. In the event that your call is answered by a member of our staff, make sure to document and save the date, time, and the name of the person that took your information.  Exception #2 (Planned Surgery): In the event that you are scheduled by another doctor or dentist to have any type of surgery or procedure, you are allowed (for a period no longer than 30 days), to receive additional pain medication, for the acute post-op pain. However, in this case, you are responsible for picking up a copy of our "Post-op Pain Management for Surgeons" handout, and giving it to your surgeon or dentist. This document is available at our office, and does not require an appointment to obtain it. Simply go to our office during business hours (Monday-Thursday from 8:00 AM to 4:00 PM) (Friday 8:00 AM to 12:00 Noon) or if you have a scheduled appointment with [place], prior to your surgery, and ask for it by name. In addition, you are responsible for: calling our office [phone], at any time of the day or night, and leaving a message stating your name, name of your surgeon, type of surgery, and date of procedure or surgery. Failure to comply with your responsibilities may result in termination of therapy involving the controlled substances. Medication Agreement Violation. Following the above rules, including your responsibilities will help you in avoiding a Medication Agreement Violation ("Breaking your Pain Medication Contract").  Consequences:  Not following the above rules may result in permanent discontinuation of medication prescription therapy.  *Opioid medications include: morphine, codeine, oxycodone, oxymorphone, hydrocodone, hydromorphone, meperidine, tramadol,  tapentadol, buprenorphine, fentanyl, methadone. **Benzodiazepine medications include: diazepam (Valium), alprazolam (Xanax), clonazepam (Klonopine), lorazepam (Ativan), clorazepate (Tranxene), chlordiazepoxide (Librium), estazolam (Prosom), oxazepam (Serax), temazepam (Restoril), triazolam (Halcion) (Last updated: 08/20/2022) ______________________________________________________________________    ______________________________________________________________________  Medication Recommendations and Reminders  Applies to: All patients receiving prescriptions (written and/or electronic).  Medication Rules & Regulations: You are responsible for reading, knowing, and following our "Medication Rules" document. These exist for your safety and that of others. They are not flexible and neither are we. Dismissing or ignoring them is an act of "non-compliance" that may result in complete and irreversible termination of such medication therapy. For safety reasons, "non-compliance" will not be tolerated. As with the [place] fundamental legal principle of "ignorance of the law is no defense", we will accept no excuses for not having read and knowing the content of documents provided to you by our practice.  Pharmacy of record:  Definition: This is the pharmacy where your electronic prescriptions will be sent.  We do not endorse any particular pharmacy. It is up to you and your insurance to decide what pharmacy to use.  We do not restrict you in your choice of pharmacy. However, once we write for your prescriptions, we will NOT be re-sending more prescriptions to fix restricted supply problems created by your pharmacy, or your insurance.  The pharmacy listed in the electronic medical record should be the one where you want electronic prescriptions to be sent. If you choose to change pharmacy, simply notify our nursing staff. Changes will be made only during your regular appointments and not over the  phone.  Recommendations: Keep all of your pain medications in a safe place, under lock and key, even  if you live alone. We will NOT replace lost, stolen, or damaged medication. We do not accept "Police Reports" as proof of medications having been stolen. After you fill your prescription, take 1 week's worth of pills and put them away in a safe place. You should keep a separate, properly labeled bottle for this purpose. The remainder should be kept in the original bottle. Use this as your primary supply, until it runs out. Once it's gone, then you know that you have 1 week's worth of medicine, and it is time to come in for a prescription refill. If you do this correctly, it is unlikely that you will ever run out of medicine. To make sure that the above recommendation works, it is very important that you make sure your medication refill appointments are scheduled at least 1 week before you run out of medicine. To do this in an effective manner, make sure that you do not leave the office without scheduling your next medication management appointment. Always ask the nursing staff to show you in your prescription , when your medication will be running out. Then arrange for the receptionist to get you a return appointment, at least 7 days before you run out of medicine. Do not wait until you have 1 or 2 pills left, to come in. This is very poor planning and does not take into consideration that we may need to cancel appointments due to bad weather, sickness, or emergencies affecting our staff. DO NOT ACCEPT A "Partial Fill": If for any reason your pharmacy does not have enough pills/tablets to completely fill or refill your prescription, do not allow for a "partial fill". The law allows the pharmacy to complete that prescription within 72 hours, without requiring a new prescription. If they do not fill the rest of your prescription within those 72 hours, you will need a separate prescription to fill the remaining  amount, which we will NOT provide. If the reason for the partial fill is your insurance, you will need to talk to the pharmacist about payment alternatives for the remaining tablets, but again, DO NOT ACCEPT A PARTIAL FILL, unless you can trust your pharmacist to obtain the remainder of the pills within 72 hours.  Prescription refills and/or changes in medication(s):  Prescription refills, and/or changes in dose or medication, will be conducted only during scheduled medication management appointments. (Applies to both, written and electronic prescriptions.) No refills on procedure days. No medication will be changed or started on procedure days. No changes, adjustments, and/or refills will be conducted on a procedure day. Doing so will interfere with the diagnostic portion of the procedure. No phone refills. No medications will be "called into the pharmacy". No Fax refills. No weekend refills. No Holliday refills. No after hours refills.  Remember:  Business hours are:  Monday to Thursday 8:00 AM to 4:00 PM Provider's Schedule: Delano Metz, MD - Appointments are:  Medication management: Monday and Wednesday 8:00 AM to 4:00 PM Procedure day: Tuesday and Thursday 7:30 AM to 4:00 PM Edward Jolly, MD - Appointments are:  Medication management: Tuesday and Thursday 8:00 AM to 4:00 PM Procedure day: Monday and Wednesday 7:30 AM to 4:00 PM (Last update: 08/20/2022) ______________________________________________________________________   ____________________________________________________________________________________________  Naloxone Nasal Spray  Why am I receiving this medication? [place] STOP ACT requires that all patients taking high dose opioids or at risk of opioids respiratory depression, be prescribed an opioid reversal agent, such as Naloxone (AKA: Narcan).  What is this medication? NALOXONE (  nal OX one) treats opioid overdose, which causes slow or shallow breathing,  severe drowsiness, or trouble staying awake. Call emergency services after using this medication. You may need additional treatment. Naloxone works by reversing the effects of opioids. It belongs to a group of medications called opioid blockers.  COMMON BRAND NAME(S): Kloxxado, Narcan  What should I tell my care team before I take this medication? They need to know if you have any of these conditions: Heart disease Substance use disorder An unusual or allergic reaction to naloxone, other medications, foods, dyes, or preservatives Pregnant or trying to get pregnant Breast-feeding  When to use this medication? This medication is to be used for the treatment of respiratory depression (less than 8 breaths per minute) secondary to opioid overdose.   How to use this medication? This medication is for use in the nose. Lay the person on their back. Support their neck with your hand and allow the head to tilt back before giving the medication. The nasal spray should be given into 1 nostril. After giving the medication, move the person onto their side. Do not remove or test the nasal spray until ready to use. Get emergency medical help right away after giving the first dose of this medication, even if the person wakes up. You should be familiar with how to recognize the signs and symptoms of a narcotic overdose. If more doses are needed, give the additional dose in the other nostril. Talk to your care team about the use of this medication in children. While this medication may be prescribed for children as young as newborns for selected conditions, precautions do apply.  Naloxone Overdosage: If you think you have taken too much of this medicine contact a poison control center or emergency room at once.  NOTE: This medicine is only for you. Do not share this medicine with others.  What if I miss a dose? This does not apply.  What may interact with this medication? This is only used during an  emergency. No interactions are expected during emergency use. This list may not describe all possible interactions. Give your health care provider a list of all the medicines, herbs, non-prescription drugs, or dietary supplements you use. Also tell them if you smoke, drink alcohol, or use illegal drugs. Some items may interact with your medicine.  What should I watch for while using this medication? Keep this medication ready for use in the case of an opioid overdose. Make sure that you have the phone number of your care team and local hospital ready. You may need to have additional doses of this medication. Each nasal spray contains a single dose. Some emergencies may require additional doses. After use, bring the treated person to the nearest hospital or call 911. Make sure the treating care team knows that the person has received a dose of this medication. You will receive additional instructions on what to do during and after use of this medication before an emergency occurs.  What side effects may I notice from receiving this medication? Side effects that you should report to your care team as soon as possible: Allergic reactions--skin rash, itching, hives, swelling of the face, lips, tongue, or throat Side effects that usually do not require medical attention (report these to your care team if they continue or are bothersome): Constipation Dryness or irritation inside the nose Headache Increase in blood pressure Muscle spasms Stuffy nose Toothache This list may not describe all possible side effects. Call your doctor for  medical advice about side effects. You may report side effects to FDA at 1-800-FDA-1088.  Where should I keep my medication? Because this is an emergency medication, you should keep it with you at all times.  Keep out of the reach of children and pets. Store between 20 and 25 degrees C (68 and 77 degrees F). Do not freeze. Throw away any unused medication after the  expiration date. Keep in original box until ready to use.  NOTE: This sheet is a summary. It may not cover all possible information. If you have questions about this medicine, talk to your doctor, pharmacist, or health care provider.   2023 [employer]/Gold Standard (2021-06-22 00:00:00)  ____________________________________________________________________________________________

## 2023-07-25 NOTE — Progress Notes (Unsigned)
PROVIDER NOTE: Information contained herein reflects review and annotations entered in association with encounter. Interpretation of such information and data should be left to medically-trained personnel. Information provided to patient can be located elsewhere in the medical record under "Patient Instructions". Document created using STT-dictation technology, any transcriptional errors that may result from process are unintentional.    Patient: Karen Edwards  Service Category: E/M  Provider: Oswaldo Done, MD  DOB: 06-06-1966  DOS: 07/30/2023  Referring Provider: Doreene Nest, NP  MRN: [id number]  Specialty: Interventional Pain Management  PCP: Melburn Popper, FNP  Type: Established Patient  Setting: Ambulatory outpatient    Location: Office  Delivery: Face-to-face     HPI  Karen Edwards, a 57 y.o. year old female, is here today because of her Malfunction of spinal cord stimulator, subsequent encounter [T85.192D]. Karen Edwards primary complain today is Back Pain (lower)  Pertinent problems: Karen Edwards has Chronic low back pain (1ry area of Pain) (Bilateral) (R>L) w/ sciatica (Bilateral); DDD (degenerative disc disease), lumbar; DDD (degenerative disc disease), cervical; Chronic occipital neuralgia (5th area of Pain) (Bilateral) (R>L); Lumbar facet syndrome (Bilateral) (L>R); DDD (degenerative disc disease), lumbosacral; Lumbar radicular pain (Right) (L4); Sacroiliac joint dysfunction (Bilateral); Chronic neck pain (4th area of Pain) (Bilateral) (R>L); Numbness and tingling of upper extremity (Right); Chronic upper extremity pain (Bilateral) (R>L); Chronic sacroiliac joint pain (Bilateral) (L>R); Lumbar facet hypertrophy (Bilateral); L1-2 disc extrusion (Right); Cervical foraminal stenosis (C5-6) (Bilateral); Chronic pain syndrome; Chronic cervical radicular pain (Right: C6/C7) (Left: C5/T1); Chronic lumbar radicular pain; Chronic hip pain (3ry area of Pain) (Bilateral) (R>L);  Chronic lower extremity pain (2ry area of Pain) (Bilateral) (R>L); Chronic shoulder pain (Bilateral) (L>R); Chronic musculoskeletal pain; Lumbar spondylosis; Cervical facet syndrome (Bilateral) (R>L); Myofascial pain; Spondylosis without myelopathy or radiculopathy, cervical region; Cervicalgia; S/P insertion of Epidural Neurostimulator (SCS) (Bilateral); Chronic neck pain with history of cervical spinal surgery; 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[facility]); Trigger point with back pain (Right); Cervicogenic headache (Bilateral); Occipital headache (Bilateral); Chronic tension-type headache, intractable; Cervico-occipital neuralgia (Bilateral); Pain due to any device, implant or graft (SCS battery) (Left PSIS area); Spondylosis without myelopathy or radiculopathy, lumbosacral region; History of cervical spinal surgery (C5-7 ACDF); Cervical radiculopathy at C6 (Bilateral); Chronic low back pain (Bilateral) w/o sciatica; Presence of neurostimulator (Thoracolumbar); Spinal cord stimulator dysfunction (HCC); Neurogenic pain; Chronic low back pain (Left) w/o sciatica; Cluneal neuropathy (Left); Abnormal MRI, lumbar spine (04/17/2021); Enthesopathy of hip region (Bilateral); Protrusion of lumbar intervertebral disc (Right: L1-2, L3-4); 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 Pain Assessment: Severity of Chronic pain is reported as a 6 /10. Location: Back Left, Right/pain radiaities down to her hip and buttock. Onset: More than a month ago. Quality: Aching, Burning, Constant, Throbbing, Tightness, Stabbing, Shooting, Radiating, Discomfort. Timing: Constant. Modifying factor(s): Meds and repositioning, stimulator. Vitals:  height is 5\' 1"  (1.549 m) and weight is 165 lb (74.8 kg). Her  temperature is 98.1 F (36.7 C). Her blood pressure is 148/85 (abnormal) and her Edwards is 83. Her oxygen saturation is 100%.  BMI: Estimated body mass index is 31.18 kg/m as calculated from the following:   Height as of this encounter: 5\' 1"  (1.549 m).   Weight as of this encounter: 165 lb (74.8 kg). Last encounter: 05/07/2023. Last procedure: 01/14/2023.  Reason for encounter:  medication management.  The patient indicates doing well with the current medication regimen. No adverse reactions or side effects reported to the medications.   Routine UDS ordered today.   PMP: Last oxycodone written on 05/07/2023 was filled on 07/08/2023.  According to her pill counts she still has 10 over 20 pills left from that prescription meaning that she is really using them PRN.  In fact, today we had to call her pharmacy and cancel the other 2 prescriptions written on 05/07/2023 since she did not use the other 2.  According to the patient, she has not been taking them all the time since she only gets 20 pills/month.  I explained to her that I only give her 20 pills/month since our counts would indicate that it is about the amount that she actually takes however, on the next refill I will again review this amount since it would seem that she is taking even less than that.  Today she comes in and she indicates that she is still having difficulty charging her spinal cord stimulator.  She did receive the new charger that [employer] sent her but is still not charging the device correctly.  She states that while in the past 1 chart would last her approximately 3 days, now it is not lasting even 1 day.  In fact, she states that today did advise he is "dead".  I reminded her that she should never let the battery completely discharge since the literature on the device indicates that if this happens 3 times he can permanently damaged the device.  Today we have attempted to take a readout of the spinal cord stimulator to be able to  analyze it, but unfortunately we were unable to link up with the device and this in fact it is fully discharged.  According to my calculations this battery should be approximately 57 years old.  Today we will refill the patient's medications and we will make an effort to have her return on a day that we can have the [employer] representative be here so that they can assist does not "jump starting" her battery.  I have reminded the patient to make sure that the night before coming in she spends as much time as possible recharging that battery so that we can analyze the IPG and get a reading on the electrode impedance.  RTCB: 11/05/2023   Pharmacotherapy Assessment  Analgesic: Oxycodone IR 5 mg, 1 tab PO QD (5 mg/day of oxycodone) PRN MME/day: 7.5 mg/day.   Monitoring: [place] PMP: PDMP not reviewed this encounter.       Pharmacotherapy: No side-effects or adverse reactions reported. Compliance: No problems identified. Effectiveness: Clinically acceptable.  Karen Pulse, RN  07/30/2023  2:50 PM  Sign when Signing Visit Nursing Pain Medication Assessment:  Safety precautions to be maintained throughout the outpatient stay will include: orient to surroundings, keep bed in low position, maintain call bell within reach at all times, provide assistance with transfer out of bed and ambulation.  Medication Inspection Compliance: Pill count conducted under aseptic conditions, in front of the patient. Neither the pills nor the bottle was removed from the patient's sight at any time. Once count was completed pills were immediately returned to the patient in their original bottle.  Medication: See above Pill/Patch Count:  10 of 20 pills remain Pill/Patch Appearance: Markings consistent with prescribed medication Bottle Appearance: Standard pharmacy container. Clearly labeled. Filled Date: 24 / 10 / 2024 Last Medication intake:  Day before yesterdaySafety precautions  to be maintained throughout the outpatient stay  will include: orient to surroundings, keep bed in low position, maintain call bell within reach at all times, provide assistance with transfer out of bed and ambulation.     No results found for: "CBDTHCR" No results found for: "D8THCCBX" No results found for: "D9THCCBX"  UDS:  Summary  Date Value Ref Range Status  09/11/2022 Note  Final    Comment:    ==================================================================== Tox[employer] Select 13 (MW) ==================================================================== Test                             Result       Flag       Units  Drug Present not Declared for Prescription Verification   Hydrocodone                    395          UNEXPECTED ng/mg creat   Norhydrocodone                 583          UNEXPECTED ng/mg creat    Sources of hydrocodone include scheduled prescription medications.    Norhydrocodone is an expected metabolite of hydrocodone.  Drug Absent but Declared for Prescription Verification   Oxycodone                      Not Detected UNEXPECTED ng/mg creat ==================================================================== Test                      Result    Flag   Units      Ref Range   Creatinine              165              mg/dL      >=40 ==================================================================== Declared Medications:  The flagging and interpretation on this report are based on the  following declared medications.  Unexpected results may arise from  inaccuracies in the declared medications.   **Note: The testing scope of this panel includes these medications:   Oxycodone (OxyIR)  Oxycodone (Percocet)   **Note: The testing scope of this panel does not include the  following reported medications:   Acetaminophen (Tylenol)  Acetaminophen (Percocet)  Cyclobenzaprine (Flexeril)  Fluoxetine (Prozac)  Naloxone (Narcan)  Ondansetron (Zofran)  Scopolamine  Tizanidine (Zanaflex)  Trazodone  (Desyrel) ==================================================================== For clinical consultation, please call [phone]. ====================================================================       ROS  Constitutional: Denies any fever or chills Gastrointestinal: No reported hemesis, hematochezia, vomiting, or acute GI distress Musculoskeletal: Denies any acute onset joint swelling, redness, loss of ROM, or weakness Neurological: No reported episodes of acute onset apraxia, aphasia, dysarthria, agnosia, amnesia, paralysis, loss of coordination, or loss of consciousness  Medication Review  FLUoxetine, QUEtiapine, acetaminophen, linaclotide, naloxone, ondansetron, oxyCODONE, and scopolamine  History Review  Allergy: Karen Edwards is allergic to gabapentin. Drug: Karen Edwards  reports no history of drug use. Alcohol:  reports current alcohol use of about 1.0 standard drink of alcohol per week. Tobacco:  reports that she has been smoking cigarettes. She has a 18.5 pack-year smoking history. She has never used smokeless tobacco. Social: Karen Edwards  reports that she has been smoking cigarettes. She has a 18.5 pack-year smoking history. She has never used smokeless tobacco. She reports current alcohol use of about 1.0 standard drink of  alcohol per week. She reports that she does not use drugs. Medical:  has a past medical history of Anxiety, Chronic back pain, Degenerative joint disease, Depression, Headache, Hypotension, IBS (irritable bowel syndrome) (05/2015), MVP (mitral valve prolapse), and Nausea and vomiting. Surgical: Karen Edwards  has a past surgical history that includes Cesarean section; Ankle surgery (Right, 03/02/2014); Hemorrhoid surgery (N/A, 06/16/2015); Cervical spine surgery (02/21/2016); Cesarean section with bilateral tubal ligation (1990); Colonoscopy (04/19/2015); Hysteroscopy with D & C (12/24/2013); Colonoscopy with propofol (N/A, 05/18/2018);  Esophagogastroduodenoscopy (egd) with propofol (N/A, 05/18/2018); Spinal cord stimulator insertion (N/A, 06/18/2018); Tubal ligation; Lumbar spinal cord simulator revision (N/A, 03/14/2020); and Colonoscopy with propofol (N/A, 06/25/2023). Family: family history includes Arthritis in her mother; Congestive Heart Failure in her maternal grandfather; Depression in her mother; Diabetes in her maternal aunt; Diabetes Mellitus I in her maternal grandmother; Gout in her maternal aunt; Hearing loss in her father; Heart attack in her maternal grandfather; Hyperlipidemia in her father and mother; Hypertension in her father and mother; Hypothyroidism in her mother; Kidney disease in her mother; Prostate cancer in her paternal grandfather; Stroke in her maternal grandmother; Thyroid disease in her maternal aunt; Vision loss in her mother.  Laboratory Chemistry Profile   Renal Lab Results  Component Value Date   BUN 12 02/01/2021   CREATININE 0.85 02/01/2021   BCR 8 (L) 09/08/2017   GFR 77.34 02/01/2021   GFRAA 93 09/08/2017   GFRNONAA 81 09/08/2017    Hepatic Lab Results  Component Value Date   AST 15 02/01/2021   ALT 11 02/01/2021   ALBUMIN 4.4 02/01/2021   ALKPHOS 59 02/01/2021    Electrolytes Lab Results  Component Value Date   NA 139 02/01/2021   K 4.2 02/01/2021   CL 105 02/01/2021   CALCIUM 9.3 02/01/2021   MG 2.1 09/08/2017    Bone Lab Results  Component Value Date   VD25OH 43.74 01/10/2016   25OHVITD1 51 09/08/2017   25OHVITD2 <1.0 09/08/2017   25OHVITD3 51 09/08/2017    Inflammation (CRP: Acute Phase) (ESR: Chronic Phase) Lab Results  Component Value Date   CRP 1.4 09/08/2017   ESRSEDRATE 11 09/08/2017         Note: Above Lab results reviewed.  Recent Imaging Review  US Abdomen Limited RUQ (LIVER/GB) CLINICAL DATA:  Right upper quadrant abdominal pain  EXAM: ULTRASOUND ABDOMEN LIMITED RIGHT UPPER QUADRANT  COMPARISON:  None Available.  FINDINGS: Gallbladder:  No  gallstones or wall thickening visualized. No sonographic Murphy sign noted by sonographer.  Common bile duct:  Diameter: 2.2 mm  Liver:  Increased echogenicity. No focal lesion. Portal vein is patent on color Doppler imaging with normal direction of blood flow towards the liver.  Other: None.  IMPRESSION: 1. No cholelithiasis or sonographic evidence for acute cholecystitis. 2. Increased hepatic parenchymal echogenicity suggestive of steatosis.  Electronically Signed   By: Annia Belt M.D.   On: 05/19/2023 14:46 Note: Reviewed        Physical Exam  General appearance: Well nourished, well developed, and well hydrated. In no apparent acute distress Mental status: Alert, oriented x 3 (person, place, & time)       Respiratory: No evidence of acute respiratory distress Eyes: PERLA Vitals: BP (!) 148/85   Edwards 83   Temp 98.1 F (36.7 C)   Ht 5\' 1"  (1.549 m)   Wt 165 lb (74.8 kg)   LMP 05/05/2017 (Exact Date)   SpO2 100%   BMI 31.18 kg/m  BMI: Estimated  body mass index is 31.18 kg/m as calculated from the following:   Height as of this encounter: 5\' 1"  (1.549 m).   Weight as of this encounter: 165 lb (74.8 kg). Ideal: Ideal body weight: 47.8 kg (105 lb 6.1 oz) Adjusted ideal body weight: 58.6 kg (129 lb 3.7 oz)  Assessment   Diagnosis Status  1. Malfunction of spinal cord stimulator, subsequent encounter   2. Chronic low back pain (1ry area of Pain) (Bilateral) (R>L) w/ sciatica (Bilateral)   3. Chronic lower extremity pain (2ry area of Pain) (Bilateral) (R>L)   4. Chronic hip pain (3ry area of Pain) (Bilateral) (R>L)   5. Chronic neck pain (4th area of Pain) (Bilateral) (R>L)   6. Chronic occipital neuralgia (5th area of Pain) (Bilateral) (R>L)   7. Lumbar facet syndrome (Bilateral) (L>R)   8. Chronic pain syndrome   9. Pharmacologic therapy   10. Chronic use of opiate for therapeutic purpose   11. Encounter for medication management   12. Encounter for  chronic pain management    Persistent Controlled Controlled   Updated Problems: No problems updated.  Plan of Care  Problem-specific:  No problem-specific Assessment & Plan notes found for this encounter.  Karen Edwards has a current medication list which includes the following long-term medication(s): fluoxetine, linaclotide, [START ON 08/07/2023] oxycodone, [START ON 09/06/2023] oxycodone, [START ON 10/06/2023] oxycodone, and quetiapine.  Pharmacotherapy (Medications Ordered): Meds ordered this encounter  Medications   oxyCODONE (OXY IR/ROXICODONE) 5 MG immediate release tablet    Sig: Take 1 tablet (5 mg total) by mouth 2 (two) times daily as needed for severe pain. Must last 30 days.    Dispense:  20 tablet    Refill:  0    DO NOT: delete (not duplicate); no partial-fill (will deny script to complete), no refill request (F/U required). DISPENSE: 1 day early if closed on fill date. WARN: No CNS-depressants within 8 hrs of med.   oxyCODONE (OXY IR/ROXICODONE) 5 MG immediate release tablet    Sig: Take 1 tablet (5 mg total) by mouth 2 (two) times daily as needed for severe pain. Must last 30 days.    Dispense:  20 tablet    Refill:  0    DO NOT: delete (not duplicate); no partial-fill (will deny script to complete), no refill request (F/U required). DISPENSE: 1 day early if closed on fill date. WARN: No CNS-depressants within 8 hrs of med.   oxyCODONE (OXY IR/ROXICODONE) 5 MG immediate release tablet    Sig: Take 1 tablet (5 mg total) by mouth 2 (two) times daily as needed for severe pain. Must last 30 days.    Dispense:  20 tablet    Refill:  0    DO NOT: delete (not duplicate); no partial-fill (will deny script to complete), no refill request (F/U required). DISPENSE: 1 day early if closed on fill date. WARN: No CNS-depressants within 8 hrs of med.   Orders:  Orders Placed This Encounter  Procedures   ToxASSURE Select 13 (MW), Urine    Volume: 30 ml(s). Minimum 3 ml of  urine is needed. Document temperature of fresh sample. Indications: Long term (current) use of opiate analgesic (Z61.096)    Order Specific Question:   Release to patient    Answer:   Immediate   Nursing Instructions:    1). STAT: UDS required today. 2). Make sure to document all opioids and benzodiazepines taken, including time of last intake. 3). If order is entered  on a procedure day, make sure sample is obtained before any medications are administered.   Follow-up plan:   Return in about 2 weeks (around 08/13/2023) for Eval-day (M,W), (F2F) for analyses and programming of SCS.      Interventional Therapies  Risk Factors  Considerations:   Mitral valve prolapse (MVP) GERD  urinary stress incontinence  GAD   Planned  Pending:   Patient was instructed to recharge her neurostimulator battery so that we can actually link up and get information regarding the IPG and the electrodes.  (07/30/2023) today we will attempt to coordinate her next visit with that of a [employer] representative so that they can assist those in jump starting her neurostimulator battery.   Under consideration:   Therapeutic right L1-2 & L3-4 percutaneous discectomy ([employer] compressor)  Therapeutic intrathecal pump trial    Completed:   Therapeutic right cervical ESI x2 (12/17/2022) (100/100/90/100)  Diagnostic bilateral GONB x1 (08/19/2019) (100/100/100/,50)  Palliative right cervical facet MBB x3 (07/23/2018) (100/100/100/>50)  Palliative left cervical facet MBB x4 (04/01/2019) (90/90/0/0)  Palliative right cervical facet RFA x1 (09/17/2018) (100/100/85/85)  Palliative left cervical facet RFA x1 (05/04/2019) (100/100/50/50)  Therapeutic left L1-2 LESI x1 (05/10/2021) (100/100/100 x 2 days/0)  Therapeutic right L1-2 LESI x2 (02/10/2018) (100/100/70/50-75)  Therapeutic right L1 TFESI x1 (02/10/2018) (100/100/70/50-75)  Diagnostic/therapeutic left superior cluneal NB (L2, L3 dorsal rami) x1 (04/24/2021) (0/0/0/0)   Diagnostic/therapeutic left L1 TFESI x2 (11/15/2021) (100/100/0)  Diagnostic/therapeutic left L3 TFESI x2 (11/15/2021) (100/100/0)  Bilateral lumbar spinal cord stimulator trial (done) (05/14/2018) (by me)  Bilateral lumbar spinal cord stimulator implant (09/28/2019) (by me)  Bilateral spinal cord stimulator revision (03/14/2020) (by me)  Diagnostic right lumbar facet MBB x3 (11/30/2019) (100/100/50/20)  Diagnostic left lumbar facet MBB x4 (01/14/2023) (100/100/100/75)  Diagnostic bilateral SI joint Blk x2 (09/18/2021) (100/100/100)  Palliative left lumbar facet RFA x3 (06/27/2022) (100/100/100/100)  Therapeutic right lumbar facet RFA x3 (05/21/2022) (100/100/75/75)    Therapeutic  Palliative (PRN) options:   Palliative right cervical facet RFA   Diagnostic bilateral SI joint Blk   Therapeutic bilateral lumbar facet RFA    Pharmacotherapy  Nonopioids transfer 08/23/2020: Lidocaine 5% ointment and Robaxin       Recent Visits Date Type Provider Dept  05/07/23 Office Visit Delano Metz, MD [facility]  Showing recent visits within past 90 days and meeting all other requirements Today's Visits Date Type Provider Dept  07/30/23 Office Visit Delano Metz, MD [facility]  Showing today's visits and meeting all other requirements Future Appointments Date Type Provider Dept  08/13/23 Appointment Delano Metz, MD [facility]  Showing future appointments within next 90 days and meeting all other requirements  I discussed the assessment and treatment plan with the patient. The patient was provided an opportunity to ask questions and all were answered. The patient agreed with the plan and demonstrated an understanding of the instructions.  Patient advised to call back or seek an in-person evaluation if the symptoms or condition worsens.  Duration of encounter: 40 minutes.  Total time on encounter, as per AMA guidelines included both the  face-to-face and non-face-to-face time personally spent by the physician and/or other qualified health care professional(s) on the day of the encounter (includes time in activities that require the physician or other qualified health care professional and does not include time in activities normally performed by clinical staff). Physician's time may include the following activities when performed: Preparing to see the patient (e.g., pre-charting review of records, searching  for previously ordered imaging, lab work, and nerve conduction tests) Review of prior analgesic pharmacotherapies. Reviewing PMP Interpreting ordered tests (e.g., lab work, imaging, nerve conduction tests) Performing post-procedure evaluations, including interpretation of diagnostic procedures Obtaining and/or reviewing separately obtained history Performing a medically appropriate examination and/or evaluation Counseling and educating the patient/family/caregiver Ordering medications, tests, or procedures Referring and communicating with other health care professionals (when not separately reported) Documenting clinical information in the electronic or other health record Independently interpreting results (not separately reported) and communicating results to the patient/ family/caregiver Care coordination (not separately reported)  Note by: Oswaldo Done, MD Date: 07/30/2023; Time: 4:09 PM

## 2023-07-30 ENCOUNTER — Ambulatory Visit: Payer: Medicare PPO | Attending: Pain Medicine | Admitting: Pain Medicine

## 2023-07-30 ENCOUNTER — Encounter: Payer: Self-pay | Admitting: Pain Medicine

## 2023-07-30 VITALS — BP 148/85 | HR 83 | Temp 98.1°F | Ht 61.0 in | Wt 165.0 lb

## 2023-07-30 DIAGNOSIS — M5441 Lumbago with sciatica, right side: Secondary | ICD-10-CM | POA: Diagnosis present

## 2023-07-30 DIAGNOSIS — M542 Cervicalgia: Secondary | ICD-10-CM | POA: Diagnosis present

## 2023-07-30 DIAGNOSIS — T85192D Other mechanical complication of implanted electronic neurostimulator (electrode) of spinal cord, subsequent encounter: Secondary | ICD-10-CM | POA: Diagnosis present

## 2023-07-30 DIAGNOSIS — M25552 Pain in left hip: Secondary | ICD-10-CM | POA: Insufficient documentation

## 2023-07-30 DIAGNOSIS — G894 Chronic pain syndrome: Secondary | ICD-10-CM | POA: Diagnosis present

## 2023-07-30 DIAGNOSIS — G8929 Other chronic pain: Secondary | ICD-10-CM | POA: Diagnosis present

## 2023-07-30 DIAGNOSIS — M79605 Pain in left leg: Secondary | ICD-10-CM | POA: Diagnosis present

## 2023-07-30 DIAGNOSIS — M25551 Pain in right hip: Secondary | ICD-10-CM | POA: Diagnosis present

## 2023-07-30 DIAGNOSIS — M5442 Lumbago with sciatica, left side: Secondary | ICD-10-CM | POA: Diagnosis present

## 2023-07-30 DIAGNOSIS — Z79891 Long term (current) use of opiate analgesic: Secondary | ICD-10-CM | POA: Diagnosis present

## 2023-07-30 DIAGNOSIS — M5481 Occipital neuralgia: Secondary | ICD-10-CM | POA: Diagnosis present

## 2023-07-30 DIAGNOSIS — M79604 Pain in right leg: Secondary | ICD-10-CM | POA: Insufficient documentation

## 2023-07-30 DIAGNOSIS — M47816 Spondylosis without myelopathy or radiculopathy, lumbar region: Secondary | ICD-10-CM | POA: Diagnosis present

## 2023-07-30 DIAGNOSIS — Z79899 Other long term (current) drug therapy: Secondary | ICD-10-CM | POA: Insufficient documentation

## 2023-07-30 MED ORDER — OXYCODONE HCL 5 MG PO TABS: 5.0000 mg | ORAL_TABLET | Freq: Two times a day (BID) | ORAL | 0 refills | Status: DC | PRN

## 2023-07-30 MED ORDER — OXYCODONE HCL 5 MG PO TABS
5.0000 mg | ORAL_TABLET | Freq: Two times a day (BID) | ORAL | 0 refills | Status: DC | PRN
Start: 2023-10-06 — End: 2023-11-09

## 2023-07-30 NOTE — Progress Notes (Signed)
Nursing Pain Medication Assessment:  Safety precautions to be maintained throughout the outpatient stay will include: orient to surroundings, keep bed in low position, maintain call bell within reach at all times, provide assistance with transfer out of bed and ambulation.  Medication Inspection Compliance: Pill count conducted under aseptic conditions, in front of the patient. Neither the pills nor the bottle was removed from the patient's sight at any time. Once count was completed pills were immediately returned to the patient in their original bottle.  Medication: See above Pill/Patch Count:  10 of 20 pills remain Pill/Patch Appearance: Markings consistent with prescribed medication Bottle Appearance: Standard pharmacy container. Clearly labeled. Filled Date: 58 / 10 / 2024 Last Medication intake:  Day before yesterdaySafety precautions to be maintained throughout the outpatient stay will include: orient to surroundings, keep bed in low position, maintain call bell within reach at all times, provide assistance with transfer out of bed and ambulation.

## 2023-07-31 ENCOUNTER — Encounter: Payer: Self-pay | Admitting: Gastroenterology

## 2023-08-03 LAB — TOXASSURE SELECT 13 (MW), URINE

## 2023-08-07 ENCOUNTER — Ambulatory Visit: Payer: Medicare PPO | Admitting: Anesthesiology

## 2023-08-07 ENCOUNTER — Ambulatory Visit
Admission: RE | Admit: 2023-08-07 | Discharge: 2023-08-07 | Disposition: A | Discharge status: Home / Self Care | Payer: Medicare PPO | Source: Ambulatory Visit | Attending: Gastroenterology | Admitting: Gastroenterology

## 2023-08-07 ENCOUNTER — Encounter: Payer: Self-pay | Admitting: Gastroenterology

## 2023-08-07 ENCOUNTER — Encounter
Admission: RE | Disposition: A | Discharge status: Home / Self Care | Payer: Self-pay | Source: Ambulatory Visit | Attending: Gastroenterology

## 2023-08-07 DIAGNOSIS — D123 Benign neoplasm of transverse colon: Secondary | ICD-10-CM | POA: Diagnosis not present

## 2023-08-07 DIAGNOSIS — Z860101 Personal history of adenomatous and serrated colon polyps: Secondary | ICD-10-CM

## 2023-08-07 DIAGNOSIS — K635 Polyp of colon: Secondary | ICD-10-CM

## 2023-08-07 DIAGNOSIS — Z860100 Personal history of colon polyps, unspecified: Secondary | ICD-10-CM

## 2023-08-07 DIAGNOSIS — F418 Other specified anxiety disorders: Secondary | ICD-10-CM | POA: Insufficient documentation

## 2023-08-07 DIAGNOSIS — Z09 Encounter for follow-up examination after completed treatment for conditions other than malignant neoplasm: Secondary | ICD-10-CM | POA: Diagnosis present

## 2023-08-07 DIAGNOSIS — F17210 Nicotine dependence, cigarettes, uncomplicated: Secondary | ICD-10-CM | POA: Insufficient documentation

## 2023-08-07 DIAGNOSIS — Z1211 Encounter for screening for malignant neoplasm of colon: Secondary | ICD-10-CM | POA: Diagnosis not present

## 2023-08-07 DIAGNOSIS — K5730 Diverticulosis of large intestine without perforation or abscess without bleeding: Secondary | ICD-10-CM | POA: Insufficient documentation

## 2023-08-07 HISTORY — PX: COLONOSCOPY WITH PROPOFOL: SHX5780

## 2023-08-07 HISTORY — PX: POLYPECTOMY: SHX5525

## 2023-08-07 SURGERY — COLONOSCOPY WITH PROPOFOL
Anesthesia: General

## 2023-08-07 MED ORDER — PROPOFOL 500 MG/50ML IV EMUL
INTRAVENOUS | Status: DC | PRN
  Administered 2023-08-07: 150 ug/kg/min via INTRAVENOUS

## 2023-08-07 MED ORDER — PROPOFOL 10 MG/ML IV BOLUS
INTRAVENOUS | Status: DC | PRN
  Administered 2023-08-07: 50 mg via INTRAVENOUS
  Administered 2023-08-07: 80 mg via INTRAVENOUS

## 2023-08-07 MED ORDER — DEXMEDETOMIDINE HCL IN NACL 80 MCG/20ML IV SOLN
INTRAVENOUS | Status: DC | PRN
Start: 2023-08-07 — End: 2023-08-07
  Administered 2023-08-07: 16 ug via INTRAVENOUS

## 2023-08-07 MED ORDER — DEXMEDETOMIDINE HCL IN NACL 80 MCG/20ML IV SOLN
INTRAVENOUS | Status: AC
  Filled 2023-08-07: qty 20

## 2023-08-07 MED ORDER — PROPOFOL 1000 MG/100ML IV EMUL
INTRAVENOUS | Status: AC
  Filled 2023-08-07: qty 100

## 2023-08-07 MED ORDER — LIDOCAINE HCL (PF) 2 % IJ SOLN
INTRAMUSCULAR | Status: AC
  Filled 2023-08-07: qty 5

## 2023-08-07 MED ORDER — SODIUM CHLORIDE 0.9 % IV SOLN: INTRAVENOUS | Status: DC

## 2023-08-07 MED ORDER — LIDOCAINE HCL (CARDIAC) PF 100 MG/5ML IV SOSY
PREFILLED_SYRINGE | INTRAVENOUS | Status: DC | PRN
  Administered 2023-08-07: 50 mg via INTRAVENOUS

## 2023-08-07 NOTE — H&P (Signed)
Arlyss Repress, MD [address]  [address]  [place], [place] [address]  Main: [phone]  Fax: [phone] Pager: [phone]  Primary Care Physician:  Melburn Popper, FNP Primary Gastroenterologist:  Dr. Arlyss Repress  Pre-Procedure History & Physical: HPI:  Karen Edwards is a 57 y.o. female is here for an colonoscopy.   Past Medical History:  Diagnosis Date   Anxiety    Chronic back pain    Degenerative joint disease    Depression    Headache    neck pain   Hypotension    IBS (irritable bowel syndrome) 05/2015   MVP (mitral valve prolapse)    patient denies   Nausea and vomiting     Past Surgical History:  Procedure Laterality Date   ANKLE SURGERY Right 03/02/2014   repair of tendon and sural nerve right ankle   CERVICAL SPINE SURGERY  02/21/2016   arthrodesis of anterior cervical spine (fusion of C5-6,C6-7 and insertion of bone allograft   CESAREAN SECTION     CESAREAN SECTION WITH BILATERAL TUBAL LIGATION  1990   COLONOSCOPY  04/19/2015   COLONOSCOPY WITH PROPOFOL N/A 05/18/2018   Procedure: COLONOSCOPY WITH PROPOFOL;  Surgeon: Toney Reil, MD;  Location: [facility] ENDOSCOPY;  Service: Gastroenterology;  Laterality: N/A;   COLONOSCOPY WITH PROPOFOL N/A 06/25/2023   Procedure: COLONOSCOPY WITH PROPOFOL;  Surgeon: Toney Reil, MD;  Location: [facility] ENDOSCOPY;  Service: Gastroenterology;  Laterality: N/A;   ESOPHAGOGASTRODUODENOSCOPY (EGD) WITH PROPOFOL N/A 05/18/2018   Procedure: ESOPHAGOGASTRODUODENOSCOPY (EGD) WITH PROPOFOL;  Surgeon: Toney Reil, MD;  Location: [facility] ENDOSCOPY;  Service: Gastroenterology;  Laterality: N/A;   HEMORRHOID SURGERY N/A 06/16/2015   Procedure: HEMORRHOIDECTOMY;  Surgeon: Nadeen Landau, MD;  Location: [facility] ORS;  Service: General;  Laterality: N/A;   HYSTEROSCOPY WITH D & C  12/24/2013   LUMBAR SPINAL CORD SIMULATOR REVISION N/A 03/14/2020   Procedure: LUMBAR SPINAL CORD SIMULATOR REVISION;  Surgeon:  Delano Metz, MD;  Location: [facility] ORS;  Service: Neurosurgery;  Laterality: N/A;   SPINAL CORD STIMULATOR INSERTION N/A 06/18/2018   Procedure: LUMBAR SPINAL CORD STIMULATOR INSERTION;  Surgeon: Delano Metz, MD;  Location: [facility] ORS;  Service: Neurosurgery;  Laterality: N/A;   TUBAL LIGATION      Prior to Admission medications   Medication Sig Start Date End Date Taking? Authorizing Provider  acetaminophen (TYLENOL) 500 MG tablet Take 1,000 mg by mouth every 6 (six) hours as needed (for pain.).     [provider]  FLUoxetine (PROZAC) 20 MG capsule Take 3 capsules (60 mg total) by mouth daily. For depression. Office visit required for further refills. 02/27/22   Doreene Nest, NP  linaclotide Karlene Einstein) 290 MCG CAPS capsule Take 1 capsule (290 mcg total) by mouth daily before breakfast. 06/25/23 03/21/24  Toney Reil, MD  naloxone [facility]) nasal spray 4 mg/0.1 mL Place 1 spray into the nose as needed for up to 365 doses (for opioid-induced respiratory depresssion). In case of emergency (overdose), spray once into each nostril. If no response within 3 minutes, repeat application and call 911. 09/11/22 09/11/23  Delano Metz, MD  ondansetron (ZOFRAN) 4 MG tablet Take 1 tablet (4 mg total) by mouth every 8 (eight) hours as needed for nausea or vomiting. 06/26/23   Matylda Fehring, Loel Dubonnet, MD  ondansetron (ZOFRAN-ODT) 4 MG disintegrating tablet Take 4 mg by mouth every 4 (four) hours as needed (vertigo/dizziness).  02/03/20   [provider]  oxyCODONE (OXY IR/ROXICODONE) 5 MG  immediate release tablet Take 1 tablet (5 mg total) by mouth 2 (two) times daily as needed for severe pain. Must last 30 days. 08/07/23 09/06/23  Delano Metz, MD  oxyCODONE (OXY IR/ROXICODONE) 5 MG immediate release tablet Take 1 tablet (5 mg total) by mouth 2 (two) times daily as needed for severe pain. Must last 30 days. 09/06/23 10/06/23  Delano Metz, MD  oxyCODONE (OXY  IR/ROXICODONE) 5 MG immediate release tablet Take 1 tablet (5 mg total) by mouth 2 (two) times daily as needed for severe pain. Must last 30 days. 10/06/23 11/05/23  Delano Metz, MD  QUEtiapine (SEROQUEL) 100 MG tablet Take 100 mg by mouth at bedtime.    [provider]  scopolamine (TRANSDERM-SCOP) 1 MG/3DAYS Place 1 patch onto the skin daily as needed (vertigo/dizziness). 02/03/20   [provider]    Allergies as of 06/26/2023 - Review Complete 06/25/2023  Allergen Reaction Noted   Gabapentin  08/13/2021    Family History  Problem Relation Age of Onset   Arthritis Mother    Depression Mother    Hyperlipidemia Mother    Hypertension Mother    Kidney disease Mother    Vision loss Mother    Hypothyroidism Mother    Hearing loss Father    Hyperlipidemia Father    Hypertension Father    Diabetes Mellitus I Maternal Grandmother    Stroke Maternal Grandmother    Congestive Heart Failure Maternal Grandfather    Heart attack Maternal Grandfather    Prostate cancer Paternal Grandfather    Thyroid disease Maternal Aunt    Diabetes Maternal Aunt    Gout Maternal Aunt     Social History   Socioeconomic History   Marital status: Widowed    Spouse name: Not on file   Number of children: 2   Years of education: Not on file   Highest education level: Not on file  Occupational History   Occupation: disabled  Tobacco Use   Smoking status: Every Day    Current packs/day: 0.50    Average packs/day: 0.5 packs/day for 37.0 years (18.5 ttl pk-yrs)    Types: Cigarettes   Smokeless tobacco: Never  Vaping Use   Vaping status: Never Used  Substance and Sexual Activity   Alcohol use: Yes    Alcohol/week: 1.0 standard drink of alcohol    Types: 1 Cans of beer per week    Comment: drinks an occasional beer   Drug use: No   Sexual activity: Yes    Birth control/protection: None, Surgical    Comment: tubal ligation  Other Topics Concern   Not on file  Social History  Narrative   Married.   2 children. One boy and one girl.   On disability.   Playing with her dogs, gardening.   Social Determinants of Health   Financial Resource Strain: Low Risk  (10/13/2017)   Overall Financial Resource Strain (CARDIA)    Difficulty of Paying Living Expenses: Not hard at all  Food Insecurity: Unknown (10/13/2017)   Hunger Vital Sign    Worried About Running Out of Food in the Last Year: Patient declined    Ran Out of Food in the Last Year: Patient declined  Transportation Needs: Unknown (10/13/2017)   PRAPARE - Administrator, Civil Service (Medical): Patient declined    Lack of Transportation (Non-Medical): Patient declined  Physical Activity: Unknown (10/13/2017)   Exercise Vital Sign    Days of Exercise per Week: Patient declined  Minutes of Exercise per Session: Patient declined  Stress: No Stress Concern Present (10/13/2017)   [employer] of Occupational Health - Occupational Stress Questionnaire    Feeling of Stress : Not at all  Social Connections: Unknown (10/13/2017)   Social Connection and Isolation Panel [NHANES]    Frequency of Communication with Friends and Family: Patient declined    Frequency of Social Gatherings with Friends and Family: Patient declined    Attends Religious Services: Patient declined    Database administrator or Organizations: Patient declined    Attends Banker Meetings: Patient declined    Marital Status: Patient declined  Intimate Partner Violence: Unknown (10/13/2017)   Humiliation, Afraid, Rape, and Kick questionnaire    Fear of Current or Ex-Partner: Patient declined    Emotionally Abused: Patient declined    Physically Abused: Patient declined    Sexually Abused: Patient declined    Review of Systems: See HPI, otherwise negative ROS  Physical Exam: BP 133/72   Pulse 90   Temp (!) 97.1 F (36.2 C) (Temporal)   Resp 16   Ht 5\' 1"  (1.549 m)   Wt 74.8 kg   LMP 05/05/2017 (Exact  Date)   SpO2 99%   BMI 31.18 kg/m  General:   Alert,  pleasant and cooperative in NAD Head:  Normocephalic and atraumatic. Neck:  Supple; no masses or thyromegaly. Lungs:  Clear throughout to auscultation.    Heart:  Regular rate and rhythm. Abdomen:  Soft, nontender and nondistended. Normal bowel sounds, without guarding, and without rebound.   Neurologic:  Alert and  oriented x4;  grossly normal neurologically.  Impression/Plan: Karen Edwards is here for an colonoscopy to be performed for h/o colon polyps  Risks, benefits, limitations, and alternatives regarding  colonoscopy have been reviewed with the patient.  Questions have been answered.  All parties agreeable.   Lannette Donath, MD  08/07/2023, 9:34 AM

## 2023-08-07 NOTE — Anesthesia Preprocedure Evaluation (Signed)
Anesthesia Evaluation  Patient identified by MRN, date of birth, ID band Patient awake    Reviewed: Allergy & Precautions, H&P , NPO status , Patient's Chart, lab work & pertinent test results, reviewed documented beta blocker date and time   Airway Mallampati: II   Neck ROM: full    Dental  (+) Poor Dentition   Pulmonary neg pulmonary ROS, Current Smoker   Pulmonary exam normal        Cardiovascular Exercise Tolerance: Good negative cardio ROS Normal cardiovascular exam Rhythm:regular Rate:Normal     Neuro/Psych  Headaches PSYCHIATRIC DISORDERS Anxiety Depression     Neuromuscular disease    GI/Hepatic Neg liver ROS,GERD  Medicated,,  Endo/Other  negative endocrine ROS    Renal/GU negative Renal ROS  negative genitourinary   Musculoskeletal   Abdominal   Peds  Hematology negative hematology ROS (+)   Anesthesia Other Findings Past Medical History: No date: Anxiety No date: Chronic back pain No date: Degenerative joint disease No date: Depression No date: Headache     Comment:  neck pain No date: Hypotension 05/2015: IBS (irritable bowel syndrome) No date: MVP (mitral valve prolapse)     Comment:  patient denies No date: Nausea and vomiting Past Surgical History: 03/02/2014: ANKLE SURGERY; Right     Comment:  repair of tendon and sural nerve right ankle 02/21/2016: CERVICAL SPINE SURGERY     Comment:  arthrodesis of anterior cervical spine (fusion of               C5-6,C6-7 and insertion of bone allograft No date: CESAREAN SECTION 1990: CESAREAN SECTION WITH BILATERAL TUBAL LIGATION 04/19/2015: COLONOSCOPY 05/18/2018: COLONOSCOPY WITH PROPOFOL; N/A     Comment:  Procedure: COLONOSCOPY WITH PROPOFOL;  Surgeon: Toney Reil, MD;  Location: [facility] ENDOSCOPY;  Service:               Gastroenterology;  Laterality: N/A; 06/25/2023: COLONOSCOPY WITH PROPOFOL; N/A     Comment:  Procedure:  COLONOSCOPY WITH PROPOFOL;  Surgeon: Toney Reil, MD;  Location: [facility] ENDOSCOPY;  Service:               Gastroenterology;  Laterality: N/A; 05/18/2018: ESOPHAGOGASTRODUODENOSCOPY (EGD) WITH PROPOFOL; N/A     Comment:  Procedure: ESOPHAGOGASTRODUODENOSCOPY (EGD) WITH               PROPOFOL;  Surgeon: Toney Reil, MD;  Location:               [facility] ENDOSCOPY;  Service: Gastroenterology;  Laterality:               N/A; 06/16/2015: HEMORRHOID SURGERY; N/A     Comment:  Procedure: HEMORRHOIDECTOMY;  Surgeon: Nadeen Landau, MD;  Location: [facility] ORS;  Service: General;                Laterality: N/A; 12/24/2013: HYSTEROSCOPY WITH D & C 03/14/2020: LUMBAR SPINAL CORD SIMULATOR REVISION; N/A     Comment:  Procedure: LUMBAR SPINAL CORD SIMULATOR REVISION;                Surgeon: Delano Metz, MD;  Location: [facility] ORS;                Service: Neurosurgery;  Laterality: N/A; 06/18/2018:  SPINAL CORD STIMULATOR INSERTION; N/A     Comment:  Procedure: LUMBAR SPINAL CORD STIMULATOR INSERTION;                Surgeon: Delano Metz, MD;  Location: [facility] ORS;                Service: Neurosurgery;  Laterality: N/A; No date: TUBAL LIGATION BMI    Body Mass Index: 31.18 kg/m     Reproductive/Obstetrics negative OB ROS                             Anesthesia Physical Anesthesia Plan  ASA: 3  Anesthesia Plan: General   Post-op Pain Management:    Induction:   PONV Risk Score and Plan:   Airway Management Planned:   Additional Equipment:   Intra-op Plan:   Post-operative Plan:   Informed Consent: I have reviewed the patients History and Physical, chart, labs and discussed the procedure including the risks, benefits and alternatives for the proposed anesthesia with the patient or authorized representative who has indicated his/her understanding and acceptance.     Dental Advisory Given  Plan Discussed with:  CRNA  Anesthesia Plan Comments:        Anesthesia Quick Evaluation

## 2023-08-07 NOTE — Transfer of Care (Signed)
Immediate Anesthesia Transfer of Care Note  Patient: Karen Edwards  Procedure(s) Performed: COLONOSCOPY WITH PROPOFOL POLYPECTOMY  Patient Location: PACU  Anesthesia Type:General  Level of Consciousness: sedated  Airway & Oxygen Therapy: Patient Spontanous Breathing  Post-op Assessment: Report given to RN and Post -op Vital signs reviewed and stable  Post vital signs: Reviewed and stable  Last Vitals:  Vitals Value Taken Time  BP    Temp    Pulse 74 08/07/23 1009  Resp 23 08/07/23 1009  SpO2 96 % 08/07/23 1009  Vitals shown include unfiled device data.  Last Pain:  Vitals:   08/07/23 0852  TempSrc: Temporal  PainSc: 0-No pain         Complications: No notable events documented.

## 2023-08-07 NOTE — Anesthesia Postprocedure Evaluation (Signed)
Anesthesia Post Note  Patient: Karen Edwards  Procedure(s) Performed: COLONOSCOPY WITH PROPOFOL POLYPECTOMY  Patient location during evaluation: PACU Anesthesia Type: General Level of consciousness: awake and alert Pain management: pain level controlled Vital Signs Assessment: post-procedure vital signs reviewed and stable Respiratory status: spontaneous breathing, nonlabored ventilation, respiratory function stable and patient connected to nasal cannula oxygen Cardiovascular status: blood pressure returned to baseline and stable Postop Assessment: no apparent nausea or vomiting Anesthetic complications: no   No notable events documented.   Last Vitals:  Vitals:   08/07/23 0852 08/07/23 1009  BP: 133/72 (!) 88/59  Pulse: 90 74  Resp: 16 (!) 23  Temp: (!) 36.2 C (!) 36.1 C  SpO2: 99% 96%    Last Pain:  Vitals:   08/07/23 1009  TempSrc: Temporal  PainSc: Asleep                 [name]

## 2023-08-07 NOTE — Op Note (Signed)
[facility] Gastroenterology Patient Name: Karen Edwards Procedure Date: 08/07/2023 9:32 AM MRN: [id number] Account #: [id number] Date of Birth: 1966/02/08 Admit Type: Outpatient Age: 57 Room: [facility] ENDO ROOM 3 Gender: Female Note Status: Finalized Instrument Name: Prentice Docker [id number] Procedure:             Colonoscopy Indications:           Surveillance: Personal history of adenomatous polyps                         on last colonoscopy > 5 years ago, Surveillance:                         History of adenomatous polyps, inadequate prep on last                         exam (<47yr), Last colonoscopy: August 2024 Providers:             Toney Reil MD, MD Referring MD:          Courtney Paris. Tiburcio Pea (Referring MD) Medicines:             General Anesthesia Complications:         No immediate complications. Estimated blood loss: None. Procedure:             Pre-Anesthesia Assessment:                        - Prior to the procedure, a History and Physical was                         performed, and patient medications and allergies were                         reviewed. The patient is competent. The risks and                         benefits of the procedure and the sedation options and                         risks were discussed with the patient. All questions                         were answered and informed consent was obtained.                         Patient identification and proposed procedure were                         verified by the physician, the nurse, the                         anesthesiologist, the anesthetist and the technician                         in the pre-procedure area in the procedure room in the                         endoscopy suite. Mental Status Examination: alert and  oriented. Airway Examination: normal oropharyngeal                         airway and neck mobility. Respiratory Examination:                          clear to auscultation. CV Examination: normal.                         Prophylactic Antibiotics: The patient does not require                         prophylactic antibiotics. Prior Anticoagulants: The                         patient has taken no anticoagulant or antiplatelet                         agents. ASA Grade Assessment: III - A patient with                         severe systemic disease. After reviewing the risks and                         benefits, the patient was deemed in satisfactory                         condition to undergo the procedure. The anesthesia                         plan was to use general anesthesia. Immediately prior                         to administration of medications, the patient was                         re-assessed for adequacy to receive sedatives. The                         heart rate, respiratory rate, oxygen saturations,                         blood pressure, adequacy of pulmonary ventilation, and                         response to care were monitored throughout the                         procedure. The physical status of the patient was                         re-assessed after the procedure.                        After obtaining informed consent, the colonoscope was                         passed under direct vision. Throughout the procedure,  the patient's blood pressure, pulse, and oxygen                         saturations were monitored continuously. The                         Colonoscope was introduced through the anus and                         advanced to the the cecum, identified by appendiceal                         orifice and ileocecal valve. The colonoscopy was                         performed with moderate difficulty due to significant                         looping. Successful completion of the procedure was                         aided by applying abdominal pressure. The patient                          tolerated the procedure well. The quality of the bowel                         preparation was evaluated using the BBPS [facility] Bowel                         Preparation Scale) with scores of: Right Colon = 3,                         Transverse Colon = 3 and Left Colon = 3 (entire mucosa                         seen well with no residual staining, small fragments                         of stool or opaque liquid). The total BBPS score                         equals 9. The ileocecal valve, appendiceal orifice,                         and rectum were photographed. Findings:      The perianal and digital rectal examinations were normal. Pertinent       negatives include normal sphincter tone and no palpable rectal lesions.      Two sessile polyps were found in the transverse colon. The polyps were       diminutive in size. These polyps were removed with a jumbo cold forceps.       Resection and retrieval were complete. Estimated blood loss: none.      Multiple small-mouthed diverticula were found in the recto-sigmoid colon.      Normal rectum on forward view and unable to do retroflexion Impression:            -  Two diminutive polyps in the transverse colon,                         removed with a jumbo cold forceps. Resected and                         retrieved.                        - Diverticulosis in the recto-sigmoid colon. Recommendation:        - Discharge patient to home (with escort).                        - Resume previous diet today.                        - Continue present medications.                        - Await pathology results.                        - Repeat colonoscopy in 7-10 years for surveillance                         based on pathology results. Procedure Code(s):     --- Professional ---                        [phone], Colonoscopy, flexible; with biopsy, single or                         multiple Diagnosis Code(s):     --- Professional ---                         D12.3, Benign neoplasm of transverse colon (hepatic                         flexure or splenic flexure)                        Z86.010, Personal history of colonic polyps                        K57.30, Diverticulosis of large intestine without                         perforation or abscess without bleeding CPT copyright 2022 American Medical Association. All rights reserved. The codes documented in this report are preliminary and upon coder review may  be revised to meet current compliance requirements. Dr. Libby Maw Toney Reil MD, MD 08/07/2023 10:08:54 AM This report has been signed electronically. Number of Addenda: 0 Note Initiated On: 08/07/2023 9:32 AM Scope Withdrawal Time: 0 hours 9 minutes 37 seconds  Total Procedure Duration: 0 hours 16 minutes 6 seconds  Estimated Blood Loss:  Estimated blood loss: none. Estimated blood loss: none.      [facility]

## 2023-08-08 ENCOUNTER — Encounter: Payer: Self-pay | Admitting: Gastroenterology

## 2023-08-08 LAB — SURGICAL PATHOLOGY

## 2023-08-10 ENCOUNTER — Encounter: Payer: Self-pay | Admitting: Gastroenterology

## 2023-08-12 NOTE — Progress Notes (Unsigned)
PROVIDER NOTE: Information contained herein reflects review and annotations entered in association with encounter. Interpretation of such information and data should be left to medically-trained personnel. Information provided to patient can be located elsewhere in the medical record under "Patient Instructions". Document created using STT-dictation technology, any transcriptional errors that may result from process are unintentional.    Patient: Karen Edwards  Service Category: E/M  Provider: Oswaldo Done, MD  DOB: [birth year]  DOS: 08/13/2023  Referring Provider: Melburn Popper, FNP  MRN: [id number]  Specialty: Interventional Pain Management  PCP: Melburn Popper, FNP  Type: Established Patient  Setting: Ambulatory outpatient    Location: Office  Delivery: Face-to-face     HPI  [name], a 57 y.o. year old female, is here today because of her No primary diagnosis found.. [name] primary complain today is No chief complaint on file.  Pertinent problems: [name] has Chronic low back pain (1ry area of Pain) (Bilateral) (R>L) w/ sciatica (Bilateral); DDD (degenerative disc disease), lumbar; DDD (degenerative disc disease), cervical; Chronic occipital neuralgia (5th area of Pain) (Bilateral) (R>L); Lumbar facet syndrome (Bilateral) (L>R); DDD (degenerative disc disease), lumbosacral; Lumbar radicular pain (Right) (L4); Sacroiliac joint dysfunction (Bilateral); Chronic neck pain (4th area of Pain) (Bilateral) (R>L); Numbness and tingling of upper extremity (Right); Chronic upper extremity pain (Bilateral) (R>L); Chronic sacroiliac joint pain (Bilateral) (L>R); Lumbar facet hypertrophy (Bilateral); L1-2 disc extrusion (Right); Cervical foraminal stenosis (C5-6) (Bilateral); Chronic pain syndrome; Chronic cervical radicular pain (Right: C6/C7) (Left: C5/T1); Chronic lumbar radicular pain; Chronic hip pain (3ry area of Pain) (Bilateral) (R>L); Chronic lower extremity pain  (2ry area of Pain) (Bilateral) (R>L); Chronic shoulder pain (Bilateral) (L>R); Chronic musculoskeletal pain; Lumbar spondylosis; Cervical facet syndrome (Bilateral) (R>L); Myofascial pain; Spondylosis without myelopathy or radiculopathy, cervical region; Cervicalgia; S/P insertion of Epidural Neurostimulator (SCS) (Bilateral); Chronic neck pain with history of cervical spinal surgery; 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[facility]); Trigger point with back pain (Right); Cervicogenic headache (Bilateral); Occipital headache (Bilateral); Chronic tension-type headache, intractable; Cervico-occipital neuralgia (Bilateral); Pain due to any device, implant or graft (SCS battery) (Left PSIS area); Spondylosis without myelopathy or radiculopathy, lumbosacral region; History of cervical spinal surgery (C5-7 ACDF); Cervical radiculopathy at C6 (Bilateral); Chronic low back pain (Bilateral) w/o sciatica; Presence of neurostimulator (Thoracolumbar); Spinal cord stimulator dysfunction (HCC); Neurogenic pain; Chronic low back pain (Left) w/o sciatica; Cluneal neuropathy (Left); Abnormal MRI, lumbar spine (04/17/2021); Enthesopathy of hip region (Bilateral); Protrusion of lumbar intervertebral disc (Right: L1-2, L3-4); 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 Pain Assessment: Severity of   is reported as a  /10. Location:    / . Onset:  . Quality:  . Timing:  . Modifying factor(s):  Marland Kitchen Vitals:  vitals were not taken for this visit.  BMI: Estimated body mass index is 31.18 kg/m as calculated from the following:   Height as of 08/07/23: 5\' 1"  (1.549 m).   Weight as of 08/07/23: 165 lb (74.8 kg). Last encounter: 07/30/2023. Last procedure: 01/14/2023.  Reason for encounter:  *** .  ***  Pharmacotherapy Assessment  Analgesic: Oxycodone IR 5 mg, 1 tab PO QD (5 mg/day of oxycodone) PRN MME/day: 7.5 mg/day.   Monitoring: [place] PMP: PDMP reviewed during this encounter.       Pharmacotherapy: No side-effects or adverse reactions reported. Compliance:  No problems identified. Effectiveness: Clinically acceptable.  No notes on file  No results found for: "CBDTHCR" No results found for: "D8THCCBX" No results found for: "D9THCCBX"  UDS:  Summary  Date Value Ref Range Status  07/30/2023 Note  Final    Comment:    ==================================================================== [employer] (MW) ==================================================================== Test                             Result       Flag       Units  Drug Present and Declared for Prescription Verification   Noroxycodone                   193          EXPECTED   ng/mg creat    Noroxycodone is an expected metabolite of oxycodone. Sources of    oxycodone include scheduled prescription medications.  Drug Absent but Declared for Prescription Verification   Oxycodone                      Not Detected UNEXPECTED ng/mg creat    Oxycodone is almost always present in patients taking this drug    consistently.  Absence of oxycodone could be due to lapse of time    since the last dose or unusual pharmacokinetics (rapid metabolism).  ==================================================================== Test                      Result    Flag   Units      Ref Range   Creatinine              80               mg/dL      >=40 ==================================================================== Declared Medications:  The flagging and interpretation on this report are based on the  following declared medications.  Unexpected results may arise from  inaccuracies in the declared medications.   **Note: The testing scope of this panel includes these medications:   Oxycodone (Roxicodone)   **Note:  The testing scope of this panel does not include the  following reported medications:   Acetaminophen (Tylenol)  Fluoxetine (Prozac)  Linaclotide (Linzess)  Naloxone (Narcan)  Ondansetron (Zofran)  Quetiapine (Seroquel)  Scopolamine ==================================================================== For clinical consultation, please call [phone]. ====================================================================       ROS  Constitutional: Denies any fever or chills Gastrointestinal: No reported hemesis, hematochezia, vomiting, or acute GI distress Musculoskeletal: Denies any acute onset joint swelling, redness, loss of ROM, or weakness Neurological: No reported episodes of acute onset apraxia, aphasia, dysarthria, agnosia, amnesia, paralysis, loss of coordination, or loss of consciousness  Medication Review  FLUoxetine, QUEtiapine, acetaminophen, linaclotide, naloxone, ondansetron, oxyCODONE, and scopolamine  History Review  Allergy: [name] is allergic to gabapentin. Drug: [name]  reports no history of drug use. Alcohol:  reports current alcohol use of about 1.0 standard drink of alcohol per week. Tobacco:  reports that she has been smoking cigarettes. She has a 18.5 pack-year smoking history. She has never used smokeless tobacco. Social: [name]  reports that she has been smoking cigarettes. She has a 18.5 pack-year smoking history. She has never used smokeless tobacco. She reports current alcohol use of about 1.0 standard drink of alcohol per week. She reports that she does not use drugs. Medical:  has a past medical history of Anxiety, Chronic back pain, Degenerative joint  disease, Depression, Headache, Hypotension, IBS (irritable bowel syndrome) (05/2015), MVP (mitral valve prolapse), and Nausea and vomiting. Surgical: [name]  has a past surgical history that includes Cesarean section; Ankle surgery (Right, 03/02/2014); Hemorrhoid surgery (N/A,  06/16/2015); Cervical spine surgery (02/21/2016); Cesarean section with bilateral tubal ligation (1990); Colonoscopy (04/19/2015); Hysteroscopy with D & C (12/24/2013); Colonoscopy with propofol (N/A, 05/18/2018); Esophagogastroduodenoscopy (egd) with propofol (N/A, 05/18/2018); Spinal cord stimulator insertion (N/A, 06/18/2018); Tubal ligation; Lumbar spinal cord simulator revision (N/A, 03/14/2020); Colonoscopy with propofol (N/A, 06/25/2023); Colonoscopy with propofol (N/A, 08/07/2023); and polypectomy (08/07/2023). Family: family history includes Arthritis in her mother; Congestive Heart Failure in her maternal grandfather; Depression in her mother; Diabetes in her maternal aunt; Diabetes Mellitus I in her maternal grandmother; Gout in her maternal aunt; Hearing loss in her father; Heart attack in her maternal grandfather; Hyperlipidemia in her father and mother; Hypertension in her father and mother; Hypothyroidism in her mother; Kidney disease in her mother; Prostate cancer in her paternal grandfather; Stroke in her maternal grandmother; Thyroid disease in her maternal aunt; Vision loss in her mother.  Laboratory Chemistry Profile   Renal Lab Results  Component Value Date   BUN 12 02/01/2021   CREATININE 0.85 02/01/2021   BCR 8 (L) 09/08/2017   GFR 77.34 02/01/2021   GFRAA 93 09/08/2017   GFRNONAA 81 09/08/2017    Hepatic Lab Results  Component Value Date   AST 15 02/01/2021   ALT 11 02/01/2021   ALBUMIN 4.4 02/01/2021   ALKPHOS 59 02/01/2021    Electrolytes Lab Results  Component Value Date   NA 139 02/01/2021   K 4.2 02/01/2021   CL 105 02/01/2021   CALCIUM 9.3 02/01/2021   MG 2.1 09/08/2017    Bone Lab Results  Component Value Date   VD25OH 43.74 01/10/2016   25OHVITD1 51 09/08/2017   25OHVITD2 <1.0 09/08/2017   25OHVITD3 51 09/08/2017    Inflammation (CRP: Acute Phase) (ESR: Chronic Phase) Lab Results  Component Value Date   CRP 1.4 09/08/2017   ESRSEDRATE 11 09/08/2017          Note: Above Lab results reviewed.  Recent Imaging Review  US Abdomen Limited RUQ (LIVER/GB) CLINICAL DATA:  Right upper quadrant abdominal pain  EXAM: ULTRASOUND ABDOMEN LIMITED RIGHT UPPER QUADRANT  COMPARISON:  None Available.  FINDINGS: Gallbladder:  No gallstones or wall thickening visualized. No sonographic Murphy sign noted by sonographer.  Common bile duct:  Diameter: 2.2 mm  Liver:  Increased echogenicity. No focal lesion. Portal vein is patent on color Doppler imaging with normal direction of blood flow towards the liver.  Other: None.  IMPRESSION: 1. No cholelithiasis or sonographic evidence for acute cholecystitis. 2. Increased hepatic parenchymal echogenicity suggestive of steatosis.  Electronically Signed   By: Annia Belt M.D.   On: 05/19/2023 14:46 Note: Reviewed        Physical Exam  General appearance: Well nourished, well developed, and well hydrated. In no apparent acute distress Mental status: Alert, oriented x 3 (person, place, & time)       Respiratory: No evidence of acute respiratory distress Eyes: PERLA Vitals: LMP 05/05/2017 (Exact Date)  BMI: Estimated body mass index is 31.18 kg/m as calculated from the following:   Height as of 08/07/23: 5\' 1"  (1.549 m).   Weight as of 08/07/23: 165 lb (74.8 kg). Ideal: Ideal body weight: 47.8 kg (105 lb 6.1 oz) Adjusted ideal body weight: 58.6 kg (129 lb 3.7 oz)  Assessment   Diagnosis Status  No diagnosis found. Controlled Controlled Controlled   Updated Problems: No problems updated.  Plan of Care  Problem-specific:  No problem-specific Assessment & Plan notes found for this encounter.  [name] has a current medication list which includes the following long-term medication(s): fluoxetine, linaclotide, oxycodone, [START ON 09/06/2023] oxycodone, [START ON 10/06/2023] oxycodone, and quetiapine.  Pharmacotherapy (Medications Ordered): No orders of the defined  types were placed in this encounter.  Orders:  No orders of the defined types were placed in this encounter.  Follow-up plan:   No follow-ups on file.      Interventional Therapies  Risk Factors  Considerations:   Mitral valve prolapse (MVP) GERD  urinary stress incontinence  GAD   Planned  Pending:   Patient was instructed to recharge her neurostimulator battery so that we can actually link up and get information regarding the IPG and the electrodes.  (07/30/2023) today we will attempt to coordinate her next visit with that of a [employer] representative so that they can assist those in jump starting her neurostimulator battery.   Under consideration:   Therapeutic right L1-2 & L3-4 percutaneous discectomy ([employer] compressor)  Therapeutic intrathecal pump trial    Completed:   Therapeutic right cervical ESI x2 (12/17/2022) (100/100/90/100)  Diagnostic bilateral GONB x1 (08/19/2019) (100/100/100/,50)  Palliative right cervical facet MBB x3 (07/23/2018) (100/100/100/>50)  Palliative left cervical facet MBB x4 (04/01/2019) (90/90/0/0)  Palliative right cervical facet RFA x1 (09/17/2018) (100/100/85/85)  Palliative left cervical facet RFA x1 (05/04/2019) (100/100/50/50)  Therapeutic left L1-2 LESI x1 (05/10/2021) (100/100/100 x 2 days/0)  Therapeutic right L1-2 LESI x2 (02/10/2018) (100/100/70/50-75)  Therapeutic right L1 TFESI x1 (02/10/2018) (100/100/70/50-75)  Diagnostic/therapeutic left superior cluneal NB (L2, L3 dorsal rami) x1 (04/24/2021) (0/0/0/0)  Diagnostic/therapeutic left L1 TFESI x2 (11/15/2021) (100/100/0)  Diagnostic/therapeutic left L3 TFESI x2 (11/15/2021) (100/100/0)  Bilateral lumbar spinal cord stimulator trial (done) (05/14/2018) (by me)  Bilateral lumbar spinal cord stimulator implant (09/28/2019) (by me)  Bilateral spinal cord stimulator revision (03/14/2020) (by me)  Diagnostic right lumbar facet MBB x3 (11/30/2019) (100/100/50/20)  Diagnostic left lumbar facet MBB  x4 (01/14/2023) (100/100/100/75)  Diagnostic bilateral SI joint Blk x2 (09/18/2021) (100/100/100)  Palliative left lumbar facet RFA x3 (06/27/2022) (100/100/100/100)  Therapeutic right lumbar facet RFA x3 (05/21/2022) (100/100/75/75)    Therapeutic  Palliative (PRN) options:   Palliative right cervical facet RFA   Diagnostic bilateral SI joint Blk   Therapeutic bilateral lumbar facet RFA    Pharmacotherapy  Nonopioids transfer 08/23/2020: Lidocaine 5% ointment and Robaxin        Recent Visits Date Type Provider Dept  07/30/23 Office Visit Delano Metz, MD [facility]  Showing recent visits within past 90 days and meeting all other requirements Future Appointments Date Type Provider Dept  08/13/23 Appointment Delano Metz, MD [facility]  11/03/23 Appointment Delano Metz, MD [facility]  Showing future appointments within next 90 days and meeting all other requirements  I discussed the assessment and treatment plan with the patient. The patient was provided an opportunity to ask questions and all were answered. The patient agreed with the plan and demonstrated an understanding of the instructions.  Patient advised to call back or seek an in-person evaluation if the symptoms or condition worsens.  Duration of encounter: *** minutes.  Total time on encounter, as per AMA guidelines included both the face-to-face and non-face-to-face time personally spent by the physician and/or other qualified health care professional(s) on the day of the encounter (includes  time in activities that require the physician or other qualified health care professional and does not include time in activities normally performed by clinical staff). Physician's time may include the following activities when performed: Preparing to see the patient (e.g., pre-charting review of records, searching for previously ordered imaging, lab work, and nerve conduction  tests) Review of prior analgesic pharmacotherapies. Reviewing PMP Interpreting ordered tests (e.g., lab work, imaging, nerve conduction tests) Performing post-procedure evaluations, including interpretation of diagnostic procedures Obtaining and/or reviewing separately obtained history Performing a medically appropriate examination and/or evaluation Counseling and educating the patient/family/caregiver Ordering medications, tests, or procedures Referring and communicating with other health care professionals (when not separately reported) Documenting clinical information in the electronic or other health record Independently interpreting results (not separately reported) and communicating results to the patient/ family/caregiver Care coordination (not separately reported)  Note by: Oswaldo Done, MD Date: 08/13/2023; Time: 10:43 AM

## 2023-08-13 ENCOUNTER — Telehealth: Payer: Self-pay | Admitting: Physician Assistant

## 2023-08-13 ENCOUNTER — Ambulatory Visit: Payer: Medicare PPO | Attending: Pain Medicine | Admitting: Pain Medicine

## 2023-08-13 ENCOUNTER — Encounter: Payer: Self-pay | Admitting: Pain Medicine

## 2023-08-13 VITALS — BP 146/79 | HR 80 | Temp 97.5°F | Ht 61.0 in | Wt 165.0 lb

## 2023-08-13 DIAGNOSIS — M5459 Other low back pain: Secondary | ICD-10-CM | POA: Insufficient documentation

## 2023-08-13 DIAGNOSIS — M545 Low back pain, unspecified: Secondary | ICD-10-CM | POA: Insufficient documentation

## 2023-08-13 DIAGNOSIS — T85192S Other mechanical complication of implanted electronic neurostimulator (electrode) of spinal cord, sequela: Secondary | ICD-10-CM

## 2023-08-13 DIAGNOSIS — Z9689 Presence of other specified functional implants: Secondary | ICD-10-CM | POA: Diagnosis present

## 2023-08-13 DIAGNOSIS — M5442 Lumbago with sciatica, left side: Secondary | ICD-10-CM | POA: Diagnosis present

## 2023-08-13 DIAGNOSIS — T85848S Pain due to other internal prosthetic devices, implants and grafts, sequela: Secondary | ICD-10-CM | POA: Diagnosis present

## 2023-08-13 DIAGNOSIS — M25552 Pain in left hip: Secondary | ICD-10-CM | POA: Diagnosis present

## 2023-08-13 DIAGNOSIS — Z9889 Other specified postprocedural states: Secondary | ICD-10-CM | POA: Diagnosis present

## 2023-08-13 DIAGNOSIS — M25551 Pain in right hip: Secondary | ICD-10-CM | POA: Insufficient documentation

## 2023-08-13 DIAGNOSIS — M47816 Spondylosis without myelopathy or radiculopathy, lumbar region: Secondary | ICD-10-CM | POA: Diagnosis present

## 2023-08-13 DIAGNOSIS — Z9682 Presence of neurostimulator: Secondary | ICD-10-CM | POA: Diagnosis present

## 2023-08-13 DIAGNOSIS — T85192D Other mechanical complication of implanted electronic neurostimulator (electrode) of spinal cord, subsequent encounter: Secondary | ICD-10-CM | POA: Insufficient documentation

## 2023-08-13 DIAGNOSIS — M47817 Spondylosis without myelopathy or radiculopathy, lumbosacral region: Secondary | ICD-10-CM | POA: Insufficient documentation

## 2023-08-13 DIAGNOSIS — G8929 Other chronic pain: Secondary | ICD-10-CM | POA: Diagnosis present

## 2023-08-13 DIAGNOSIS — M5441 Lumbago with sciatica, right side: Secondary | ICD-10-CM | POA: Insufficient documentation

## 2023-08-13 NOTE — Telephone Encounter (Signed)
Pt requesting call back to discuss something other than linzess has had trouble moving her bowels since colonoscopy

## 2023-08-13 NOTE — Patient Instructions (Signed)

## 2023-08-13 NOTE — Progress Notes (Signed)
Safety precautions to be maintained throughout the outpatient stay will include: orient to surroundings, keep bed in low position, maintain call bell within reach at all times, provide assistance with transfer out of bed and ambulation.  

## 2023-08-14 NOTE — Telephone Encounter (Signed)
Patient was very upset she said she has been going for over office for several years for the constipation. She states she does not want to have to take a prep the rest of her life to go to the bathroom. Informed her she is not but we have to get her body going again and have to get her clean out. She states she will do the Miralax prep. Gave her the instructions of the Miralax prep.

## 2023-08-14 NOTE — Telephone Encounter (Signed)
Patient had colonoscopy done on 08/07/2023. Patient states she has not had a bowel movement since the colonoscopy. Patient states she has had bloating but had bloating before the colonoscopy. Denies any abdominal pain or rectal pain. She states she is still taking the Linzess 290 but she has not taken anything over the counter because she states they do not work

## 2023-08-28 ENCOUNTER — Encounter: Payer: Self-pay | Admitting: Pain Medicine

## 2023-08-28 ENCOUNTER — Ambulatory Visit
Admission: RE | Admit: 2023-08-28 | Discharge: 2023-08-28 | Disposition: A | Discharge status: Home / Self Care | Payer: Medicare PPO | Source: Ambulatory Visit | Attending: Pain Medicine | Admitting: Pain Medicine

## 2023-08-28 ENCOUNTER — Ambulatory Visit: Payer: Medicare PPO | Attending: Pain Medicine | Admitting: Pain Medicine

## 2023-08-28 VITALS — BP 114/61 | HR 87 | Temp 97.3°F | Resp 17 | Ht 61.0 in | Wt 165.0 lb

## 2023-08-28 DIAGNOSIS — Z9682 Presence of neurostimulator: Secondary | ICD-10-CM | POA: Insufficient documentation

## 2023-08-28 DIAGNOSIS — M47817 Spondylosis without myelopathy or radiculopathy, lumbosacral region: Secondary | ICD-10-CM | POA: Diagnosis present

## 2023-08-28 DIAGNOSIS — M545 Low back pain, unspecified: Secondary | ICD-10-CM | POA: Insufficient documentation

## 2023-08-28 DIAGNOSIS — M25551 Pain in right hip: Secondary | ICD-10-CM | POA: Diagnosis present

## 2023-08-28 DIAGNOSIS — M47816 Spondylosis without myelopathy or radiculopathy, lumbar region: Secondary | ICD-10-CM | POA: Insufficient documentation

## 2023-08-28 DIAGNOSIS — M5137 Other intervertebral disc degeneration, lumbosacral region with discogenic back pain only: Secondary | ICD-10-CM | POA: Insufficient documentation

## 2023-08-28 DIAGNOSIS — G8929 Other chronic pain: Secondary | ICD-10-CM | POA: Diagnosis present

## 2023-08-28 DIAGNOSIS — M25552 Pain in left hip: Secondary | ICD-10-CM | POA: Diagnosis present

## 2023-08-28 DIAGNOSIS — M5459 Other low back pain: Secondary | ICD-10-CM | POA: Insufficient documentation

## 2023-08-28 MED ORDER — FENTANYL CITRATE (PF) 100 MCG/2ML IJ SOLN
INTRAMUSCULAR | Status: AC
  Filled 2023-08-28: qty 2

## 2023-08-28 MED ORDER — LIDOCAINE HCL 2 % IJ SOLN
20.0000 mL | Freq: Once | INTRAMUSCULAR | Status: AC
  Administered 2023-08-28: 400 mg

## 2023-08-28 MED ORDER — LIDOCAINE HCL 2 % IJ SOLN
INTRAMUSCULAR | Status: AC
  Filled 2023-08-28: qty 20

## 2023-08-28 MED ORDER — MIDAZOLAM HCL 5 MG/5ML IJ SOLN
0.5000 mg | Freq: Once | INTRAMUSCULAR | Status: AC
  Administered 2023-08-28 (×2): 1 mg via INTRAVENOUS
  Administered 2023-08-28: 2 mg via INTRAVENOUS

## 2023-08-28 MED ORDER — ROPIVACAINE HCL 2 MG/ML IJ SOLN
18.0000 mL | Freq: Once | INTRAMUSCULAR | Status: AC
  Administered 2023-08-28: 18 mL via PERINEURAL

## 2023-08-28 MED ORDER — LACTATED RINGERS IV SOLN: Freq: Once | INTRAVENOUS | Status: AC

## 2023-08-28 MED ORDER — TRIAMCINOLONE ACETONIDE 40 MG/ML IJ SUSP
INTRAMUSCULAR | Status: AC
  Filled 2023-08-28: qty 2

## 2023-08-28 MED ORDER — MIDAZOLAM HCL 5 MG/5ML IJ SOLN
INTRAMUSCULAR | Status: AC
  Filled 2023-08-28: qty 5

## 2023-08-28 MED ORDER — PENTAFLUOROPROP-TETRAFLUOROETH EX AERO
INHALATION_SPRAY | Freq: Once | CUTANEOUS | Status: AC
  Administered 2023-08-28: 30 via TOPICAL
  Filled 2023-08-28: qty 30

## 2023-08-28 MED ORDER — TRIAMCINOLONE ACETONIDE 40 MG/ML IJ SUSP
80.0000 mg | Freq: Once | INTRAMUSCULAR | Status: AC
  Administered 2023-08-28: 80 mg

## 2023-08-28 MED ORDER — FENTANYL CITRATE (PF) 100 MCG/2ML IJ SOLN
25.0000 ug | INTRAMUSCULAR | Status: DC | PRN
  Administered 2023-08-28: 50 ug via INTRAVENOUS

## 2023-08-28 MED ORDER — ROPIVACAINE HCL 2 MG/ML IJ SOLN
INTRAMUSCULAR | Status: AC
  Filled 2023-08-28: qty 20

## 2023-08-28 NOTE — Progress Notes (Addendum)
PROVIDER NOTE: Interpretation of information contained herein should be left to medically-trained personnel. Specific patient instructions are provided elsewhere under "Patient Instructions" section of medical record. This document was created in part using STT-dictation technology, any transcriptional errors that may result from this process are unintentional.  Patient: Karen Edwards Type: Established DOB: 07-03-1966 MRN: [id number] PCP: Melburn Popper, FNP  Service: Procedure DOS: 08/28/2023 Setting: Ambulatory Location: Ambulatory outpatient facility Delivery: Face-to-face Provider: Oswaldo Done, MD Specialty: Interventional Pain Management Specialty designation: 09 Location: Outpatient facility Ref. Prov.: Delano Metz, MD       Interventional Therapy   Type: Lumbar Facet, Medial Branch Block(s) (w/ fluoroscopic mapping) R4L5   Laterality: Bilateral  Level (Right): L3, L4, L5, and S1 Medial Branch Level(s). Injecting these levels blocks the L4-5 and L5-S1 lumbar facet joints. Level (Left): L2, L3, L4, L5, and S1 Medial Branch Level(s). Injecting these levels blocks the L3-4, L4-5, and L5-S1 lumbar facet joints. Imaging: Fluoroscopic guidance Spinal ([id number]) Anesthesia: Local anesthesia (1-2% Lidocaine) Anxiolysis: IV Versed 4.0 mg Sedation: Moderate Sedation Fentanyl 1 mL (50 mcg) DOS: 08/28/2023 Performed by: Oswaldo Done, MD  Primary Purpose: Diagnostic/Therapeutic Indications: Low back pain severe enough to impact quality of life or function. 1. Chronic low back pain (Bilateral) w/o sciatica   2. Lumbar facet joint pain   3. Lumbar facet syndrome (Bilateral) (L>R)   4. Lumbar facet hypertrophy (Bilateral)   5. Lumbar spondylosis   6. Spondylosis without myelopathy or radiculopathy, lumbosacral region   7. Degeneration of intervertebral disc of lumbosacral region with discogenic back pain   8. Presence of neurostimulator (Thoracolumbar)     NAS-11 Pain score:   Pre-procedure: 2 /10   Post-procedure: 0-No pain/10   Note: SCS working much better after reprogramming. Also taking charge much better.   Position / Prep / Materials:  Position: Prone  Prep solution: ChloraPrep (2% chlorhexidine gluconate and 70% isopropyl alcohol) Area Prepped: Posterolateral Lumbosacral Spine (Wide prep: From the lower border of the scapula down to the end of the tailbone and from flank to flank.)  Materials:  Tray: Block Needle(s):  Type: Spinal  Gauge (G): 22  Length: 5-in Qty: 4      H&P (Pre-op Assessment):  Karen Edwards is a 57 y.o. (year old), female patient, seen today for interventional treatment. She  has a past surgical history that includes Cesarean section; Ankle surgery (Right, 03/02/2014); Hemorrhoid surgery (N/A, 06/16/2015); Cervical spine surgery (02/21/2016); Cesarean section with bilateral tubal ligation (1990); Colonoscopy (04/19/2015); Hysteroscopy with D & C (12/24/2013); Colonoscopy with propofol (N/A, 05/18/2018); Esophagogastroduodenoscopy (egd) with propofol (N/A, 05/18/2018); Spinal cord stimulator insertion (N/A, 06/18/2018); Tubal ligation; Lumbar spinal cord simulator revision (N/A, 03/14/2020); Colonoscopy with propofol (N/A, 06/25/2023); Colonoscopy with propofol (N/A, 08/07/2023); and polypectomy (08/07/2023). Ms. Proffer has a current medication list which includes the following prescription(s): acetaminophen, fluoxetine, linaclotide, naloxone, ondansetron, ondansetron, oxycodone, [START ON 09/06/2023] oxycodone, [START ON 10/06/2023] oxycodone, quetiapine, scopolamine, and zolpidem, and the following Facility-Administered Medications: fentanyl and lactated ringers. Her primarily concern today is the Back Pain  Initial Vital Signs:  Pulse/HCG Rate: 87ECG Heart Rate: 67 Temp: (!) 97.3 F (36.3 C) Resp: 16 BP: 137/84 SpO2: 100 %  BMI: Estimated body mass index is 31.18 kg/m as calculated from the following:    Height as of this encounter: 5\' 1"  (1.549 m).   Weight as of this encounter: 165 lb (74.8 kg).  Risk Assessment: Allergies: Reviewed. She is allergic to gabapentin.  Allergy Precautions: None required Coagulopathies:  Reviewed. None identified.  Blood-thinner therapy: None at this time Active Infection(s): Reviewed. None identified. Ms. Charette is afebrile  Site Confirmation: Ms. Stegen was asked to confirm the procedure and laterality before marking the site Procedure checklist: Completed Consent: Before the procedure and under the influence of no sedative(s), amnesic(s), or anxiolytics, the patient was informed of the treatment options, risks and possible complications. To fulfill our ethical and legal obligations, as recommended by the American Medical Association's Code of Ethics, I have informed the patient of my clinical impression; the nature and purpose of the treatment or procedure; the risks, benefits, and possible complications of the intervention; the alternatives, including doing nothing; the risk(s) and benefit(s) of the alternative treatment(s) or procedure(s); and the risk(s) and benefit(s) of doing nothing. The patient was provided information about the general risks and possible complications associated with the procedure. These may include, but are not limited to: failure to achieve desired goals, infection, bleeding, organ or nerve damage, allergic reactions, paralysis, and death. In addition, the patient was informed of those risks and complications associated to Spine-related procedures, such as failure to decrease pain; infection (i.e.: Meningitis, epidural or intraspinal abscess); bleeding (i.e.: epidural hematoma, subarachnoid hemorrhage, or any other type of intraspinal or peri-dural bleeding); organ or nerve damage (i.e.: Any type of peripheral nerve, nerve root, or spinal cord injury) with subsequent damage to sensory, motor, and/or autonomic systems, resulting in  permanent pain, numbness, and/or weakness of one or several areas of the body; allergic reactions; (i.e.: anaphylactic reaction); and/or death. Furthermore, the patient was informed of those risks and complications associated with the medications. These include, but are not limited to: allergic reactions (i.e.: anaphylactic or anaphylactoid reaction(s)); adrenal axis suppression; blood sugar elevation that in diabetics may result in ketoacidosis or comma; water retention that in patients with history of congestive heart failure may result in shortness of breath, pulmonary edema, and decompensation with resultant heart failure; weight gain; swelling or edema; medication-induced neural toxicity; particulate matter embolism and blood vessel occlusion with resultant organ, and/or nervous system infarction; and/or aseptic necrosis of one or more joints. Finally, the patient was informed that Medicine is not an exact science; therefore, there is also the possibility of unforeseen or unpredictable risks and/or possible complications that may result in a catastrophic outcome. The patient indicated having understood very clearly. We have given the patient no guarantees and we have made no promises. Enough time was given to the patient to ask questions, all of which were answered to the patient's satisfaction. Ms. Dallis has indicated that she wanted to continue with the procedure. Attestation: I, the ordering provider, attest that I have discussed with the patient the benefits, risks, side-effects, alternatives, likelihood of achieving goals, and potential problems during recovery for the procedure that I have provided informed consent. Date  Time: 08/28/2023  8:43 AM   Pre-Procedure Preparation:  Monitoring: As per clinic protocol. Respiration, ETCO2, SpO2, BP, heart rate and rhythm monitor placed and checked for adequate function Safety Precautions: Patient was assessed for positional comfort and pressure  points before starting the procedure. Time-out: I initiated and conducted the "Time-out" before starting the procedure, as per protocol. The patient was asked to participate by confirming the accuracy of the "Time Out" information. Verification of the correct person, site, and procedure were performed and confirmed by me, the nursing staff, and the patient. "Time-out" conducted as per Joint Commission's Universal Protocol (UP.01.01.01). Time: 1002 Start Time: 1002 hrs.  Description of Procedure:  Laterality: (see above) Targeted Levels: (see above)  Safety Precautions: Aspiration looking for blood return was conducted prior to all injections. At no point did we inject any substances, as a needle was being advanced. Before injecting, the patient was told to immediately notify me if she was experiencing any new onset of "ringing in the ears, or metallic taste in the mouth". No attempts were made at seeking any paresthesias. Safe injection practices and needle disposal techniques used. Medications properly checked for expiration dates. SDV (single dose vial) medications used. After the completion of the procedure, all disposable equipment used was discarded in the proper designated medical waste containers. Local Anesthesia: Protocol guidelines were followed. The patient was positioned over the fluoroscopy table. The area was prepped in the usual manner. The time-out was completed. The target area was identified using fluoroscopy. A 12-in long, straight, sterile hemostat was used with fluoroscopic guidance to locate the targets for each level blocked. Once located, the skin was marked with an approved surgical skin marker. Once all sites were marked, the skin (epidermis, dermis, and hypodermis), as well as deeper tissues (fat, connective tissue and muscle) were infiltrated with a small amount of a short-acting local anesthetic, loaded on a 10cc syringe with a 25G, 1.5-in  Needle. An appropriate amount  of time was allowed for local anesthetics to take effect before proceeding to the next step. Local Anesthetic: Lidocaine 2.0% The unused portion of the local anesthetic was discarded in the proper designated containers. Technical description of process:  (Left) L2 Medial Branch Nerve Block (MBB): The target area for the L2 medial branch is at the junction of the postero-lateral aspect of the superior articular process and the superior, posterior, and medial edge of the transverse process of L3. Under fluoroscopic guidance, a Quincke needle was inserted until contact was made with os over the superior postero-lateral aspect of the pedicular shadow (target area). After negative aspiration for blood, 0.5 mL of the nerve block solution was injected without difficulty or complication. The needle was removed intact. (Left side only.) (Bilateral) L3 Medial Branch Nerve Block (MBB): The target area for the L3 medial branch is at the junction of the postero-lateral aspect of the superior articular process and the superior, posterior, and medial edge of the transverse process of L4. Under fluoroscopic guidance, a Quincke needle was inserted until contact was made with os over the superior postero-lateral aspect of the pedicular shadow (target area). After negative aspiration for blood, 0.5 mL of the nerve block solution was injected without difficulty or complication. The needle was removed intact. (Bilateral) L4 Medial Branch Nerve Block (MBB): The target area for the L4 medial branch is at the junction of the postero-lateral aspect of the superior articular process and the superior, posterior, and medial edge of the transverse process of L5. Under fluoroscopic guidance, a Quincke needle was inserted until contact was made with os over the superior postero-lateral aspect of the pedicular shadow (target area). After negative aspiration for blood, 0.5 mL of the nerve block solution was injected without difficulty or  complication. The needle was removed intact. (Bilateral) L5 Medial Branch Nerve Block (MBB): The target area for the L5 medial branch is at the junction of the postero-lateral aspect of the superior articular process and the superior, posterior, and medial edge of the sacral ala. Under fluoroscopic guidance, a Quincke needle was inserted until contact was made with os over the superior postero-lateral aspect of the pedicular shadow (target area). After negative aspiration for blood,  0.5 mL of the nerve block solution was injected without difficulty or complication. The needle was removed intact. (Bilateral) S1 Medial Branch Nerve Block (MBB): The target area for the S1 medial branch is at the posterior and inferior 6 o'clock position of the L5-S1 facet joint. Under fluoroscopic guidance, the Quincke needle inserted for the L5 MBB was redirected until contact was made with os over the inferior and postero aspect of the sacrum, at the 6 o' clock position under the L5-S1 facet joint (Target area). After negative aspiration for blood, 0.5 mL of the nerve block solution was injected without difficulty or complication. The needle was removed intact.  Once the entire procedure was completed, the treated area was cleaned, making sure to leave some of the prepping solution back to take advantage of its long term bactericidal properties.         Illustration of the posterior view of the lumbar spine and the posterior neural structures. Laminae of L2 through S1 are labeled. DPRL5, dorsal primary ramus of L5; DPRS1, dorsal primary ramus of S1; DPR3, dorsal primary ramus of L3; FJ, facet (zygapophyseal) joint L3-L4; I, inferior articular process of L4; LB1, lateral branch of dorsal primary ramus of L1; IAB, inferior articular branches from L3 medial branch (supplies L4-L5 facet joint); IBP, intermediate branch plexus; MB3, medial branch of dorsal primary ramus of L3; NR3, third lumbar nerve root; S, superior articular  process of L5; SAB, superior articular branches from L4 (supplies L4-5 facet joint also); TP3, transverse process of L3.   Facet Joint Innervation (* possible contribution)  L1-2 T12, L1 (L2*)  Medial Branch  L2-3 L1, L2 (L3*)         "          "  L3-4 L2, L3 (L4*)         "          "  L4-5 L3, L4 (L5*)         "          "  L5-S1 L4, L5, S1          "          "    Vitals:   08/28/23 1013 08/28/23 1024 08/28/23 1034 08/28/23 1043  BP: (!) 140/78 123/74 115/70 114/61  Pulse:      Resp: 20 17 17 17   Temp:  97.9 F (36.6 C)  (!) 97.3 F (36.3 C)  TempSrc:  Temporal  Temporal  SpO2: 100% 94% 95% 94%  Weight:      Height:         End Time: 1012 hrs.  Imaging Guidance (Spinal):          Type of Imaging Technique: Fluoroscopy Guidance (Spinal) Indication(s): Assistance in needle guidance and placement for procedures requiring needle placement in or near specific anatomical locations not easily accessible without such assistance. Exposure Time: Please see nurses notes. Contrast: None used. Fluoroscopic Guidance: I was personally present during the use of fluoroscopy. "Tunnel Vision Technique" used to obtain the best possible view of the target area. Parallax error corrected before commencing the procedure. "Direction-depth-direction" technique used to introduce the needle under continuous pulsed fluoroscopy. Once target was reached, antero-posterior, oblique, and lateral fluoroscopic projection used confirm needle placement in all planes. Images permanently stored in EMR. Interpretation: No contrast injected. I personally interpreted the imaging intraoperatively. Adequate needle placement confirmed in multiple planes. Permanent images saved into the patient's record.  Post-operative Assessment:  Post-procedure  Vital Signs:  Pulse/HCG Rate: 8770 Temp: (!) 97.3 F (36.3 C) Resp: 17 BP: 114/61 SpO2: 94 %  EBL: None  Complications: No immediate post-treatment complications  observed by team, or reported by patient.  Note: The patient tolerated the entire procedure well. A repeat set of vitals were taken after the procedure and the patient was kept under observation following institutional policy, for this type of procedure. Post-procedural neurological assessment was performed, showing return to baseline, prior to discharge. The patient was provided with post-procedure discharge instructions, including a section on how to identify potential problems. Should any problems arise concerning this procedure, the patient was given instructions to immediately contact us, at any time, without hesitation. In any case, we plan to contact the patient by telephone for a follow-up status report regarding this interventional procedure.  Comments:  No additional relevant information.  Plan of Care (POC)  Orders:  Orders Placed This Encounter  Procedures   LUMBAR FACET(MEDIAL BRANCH NERVE BLOCK) MBNB    Scheduling Instructions:     Procedure: Lumbar facet block (AKA.: Lumbosacral medial branch nerve block)     Side: Bilateral     Level: L4-5 and L5-S1 Facets (L3, L4, L5, and S1 Medial Branch Nerves)     Sedation: Patient's choice.     Timeframe: Today    Order Specific Question:   Where will this procedure be performed?    Answer:   [facility]   DG [facility] C-ARM 1-60 MIN NO REPORT    Intraoperative interpretation by procedural physician at [facility].    Standing Status:   Standing    Number of Occurrences:   1    Order Specific Question:   Reason for exam:    Answer:   Assistance in needle guidance and placement for procedures requiring needle placement in or near specific anatomical locations not easily accessible without such assistance.   Informed Consent Details: Physician/Practitioner Attestation; Transcribe to consent form and obtain patient signature    Nursing Order: Transcribe to consent form and obtain patient signature. Note: Always  confirm laterality of pain with Ms. Lelon Perla, before procedure.    Order Specific Question:   Physician/Practitioner attestation of informed consent for procedure/surgical case    Answer:   I, the physician/practitioner, attest that I have discussed with the patient the benefits, risks, side effects, alternatives, likelihood of achieving goals and potential problems during recovery for the procedure that I have provided informed consent.    Order Specific Question:   Procedure    Answer:   Lumbar Facet Block  under fluoroscopic guidance    Order Specific Question:   Physician/Practitioner performing the procedure    Answer:   Mechele Kittleson A. Laban Emperor MD    Order Specific Question:   Indication/Reason    Answer:   Low Back Pain, with our without leg pain, due to Facet Joint Arthralgia (Joint Pain) Spondylosis (Arthritis of the Spine), without myelopathy or radiculopathy (Nerve Damage).   Provide equipment / supplies at bedside    Procedure tray: "Block Tray" (Disposable  single use) Skin infiltration needle: Regular 1.5-in, 25-G, (x1) Block Needle type: Spinal Amount/quantity: 4 Size: Medium (5-inch) Gauge: 22G    Standing Status:   Standing    Number of Occurrences:   1    Order Specific Question:   Specify    Answer:   Block Tray   Chronic Opioid Analgesic:  Oxycodone IR 5 mg, 1 tab PO QD (5 mg/day of oxycodone) PRN MME/day: 7.5  mg/day.   Medications ordered for procedure: Meds ordered this encounter  Medications   lidocaine (XYLOCAINE) 2 % (with pres) injection 400 mg   pentafluoroprop-tetrafluoroeth (GEBAUERS) aerosol   lactated ringers infusion   midazolam (VERSED) 5 MG/5ML injection 0.5-2 mg    Make sure Flumazenil is available in the pyxis when using this medication. If oversedation occurs, administer 0.2 mg IV over 15 sec. If after 45 sec no response, administer 0.2 mg again over 1 min; may repeat at 1 min intervals; not to exceed 4 doses (1 mg)   fentaNYL (SUBLIMAZE) injection  25-50 mcg    Make sure Narcan is available in the pyxis when using this medication. In the event of respiratory depression (RR< 8/min): Titrate NARCAN (naloxone) in increments of 0.1 to 0.2 mg IV at 2-3 minute intervals, until desired degree of reversal.   ropivacaine (PF) 2 mg/mL (0.2%) (NAROPIN) injection 18 mL   triamcinolone acetonide (KENALOG-40) injection 80 mg   Medications administered: We administered lidocaine, pentafluoroprop-tetrafluoroeth, lactated ringers, midazolam, fentaNYL, ropivacaine (PF) 2 mg/mL (0.2%), and triamcinolone acetonide.  See the medical record for exact dosing, route, and time of administration.  Follow-up plan:   Return in about 2 weeks (around 09/11/2023) for (Face2F), (PPE).       Interventional Therapies  Risk Factors  Considerations:   Mitral valve prolapse (MVP) GERD  urinary stress incontinence  GAD   Planned  Pending:   Diagnostic/therapeutic bilateral lumbar facet MBB R4L5    Under consideration:   Therapeutic right L1-2 & L3-4 percutaneous discectomy ([employer]ker compressor)  Therapeutic intrathecal pump trial    Completed:   Therapeutic right cervical ESI x2 (12/17/2022) (100/100/90/100)  Diagnostic bilateral GONB x1 (08/19/2019) (100/100/100/,50)  Palliative right cervical facet MBB x3 (07/23/2018) (100/100/100/>50)  Palliative left cervical facet MBB x4 (04/01/2019) (90/90/0/0)  Palliative right cervical facet RFA x1 (09/17/2018) (100/100/85/85)  Palliative left cervical facet RFA x1 (05/04/2019) (100/100/50/50)  Therapeutic left L1-2 LESI x1 (05/10/2021) (100/100/100 x 2 days/0)  Therapeutic right L1-2 LESI x2 (02/10/2018) (100/100/70/50-75)  Therapeutic right L1 TFESI x1 (02/10/2018) (100/100/70/50-75)  Diagnostic/therapeutic left superior cluneal NB (L2, L3 dorsal rami) x1 (04/24/2021) (0/0/0/0)  Diagnostic/therapeutic left L1 TFESI x2 (11/15/2021) (100/100/0)  Diagnostic/therapeutic left L3 TFESI x2 (11/15/2021) (100/100/0)  Bilateral  lumbar spinal cord stimulator trial (done) (05/14/2018) (by me)  Bilateral lumbar spinal cord stimulator implant (09/28/2019) (by me)  Bilateral spinal cord stimulator revision (03/14/2020) (by me)  Diagnostic right lumbar facet MBB x3 (11/30/2019) (100/100/50/20)  Diagnostic left lumbar facet MBB x4 (01/14/2023) (100/100/100/75)  Diagnostic bilateral SI joint Blk x2 (09/18/2021) (100/100/100)  Palliative left lumbar facet RFA x3 (06/27/2022) (100/100/100/100)  Therapeutic right lumbar facet RFA x3 (05/21/2022) (100/100/75/75)    Therapeutic  Palliative (PRN) options:   Palliative right cervical facet RFA   Diagnostic bilateral SI joint Blk   Therapeutic bilateral lumbar facet RFA    Pharmacotherapy  Nonopioids transfer 08/23/2020: Lidocaine 5% ointment and Robaxin        Recent Visits Date Type Provider Dept  08/13/23 Office Visit Delano Metz, MD [facility]  07/30/23 Office Visit Delano Metz, MD [facility]  Showing recent visits within past 90 days and meeting all other requirements Today's Visits Date Type Provider Dept  08/28/23 Procedure visit Delano Metz, MD [facility]  Showing today's visits and meeting all other requirements Future Appointments Date Type Provider Dept  09/16/23 Appointment Delano Metz, MD [facility]  11/03/23 Appointment Delano Metz, MD [facility]  Showing future  appointments within next 90 days and meeting all other requirements  Disposition: Discharge home  Discharge (Date  Time): 08/28/2023; 0944 hrs.   Primary Care Physician: Melburn Popper, FNP Location: [facility] Note by: Oswaldo Done, MD (TTS technology used. I apologize for any typographical errors that were not detected and corrected.) Date: 08/28/2023; Time: 10:55 AM  Disclaimer:  Medicine is not an Visual merchandiser. The only guarantee in medicine is that nothing  is guaranteed. It is important to note that the decision to proceed with this intervention was based on the information collected from the patient. The Data and conclusions were drawn from the patient's questionnaire, the interview, and the physical examination. Because the information was provided in large part by the patient, it cannot be guaranteed that it has not been purposely or unconsciously manipulated. Every effort has been made to obtain as much relevant data as possible for this evaluation. It is important to note that the conclusions that lead to this procedure are derived in large part from the available data. Always take into account that the treatment will also be dependent on availability of resources and existing treatment guidelines, considered by other Pain Management Practitioners as being common knowledge and practice, at the time of the intervention. For Medico-Legal purposes, it is also important to point out that variation in procedural techniques and pharmacological choices are the acceptable norm. The indications, contraindications, technique, and results of the above procedure should only be interpreted and judged by a Board-Certified Interventional Pain Specialist with extensive familiarity and expertise in the same exact procedure and technique.

## 2023-08-28 NOTE — Patient Instructions (Signed)

## 2023-08-29 ENCOUNTER — Telehealth: Payer: Self-pay | Admitting: *Deleted

## 2023-08-29 NOTE — Telephone Encounter (Signed)
Post procedure call;  voicemail left.   

## 2023-09-16 ENCOUNTER — Ambulatory Visit (HOSPITAL_BASED_OUTPATIENT_CLINIC_OR_DEPARTMENT_OTHER): Payer: Medicare PPO | Admitting: Pain Medicine

## 2023-09-16 DIAGNOSIS — Z09 Encounter for follow-up examination after completed treatment for conditions other than malignant neoplasm: Secondary | ICD-10-CM

## 2023-09-16 DIAGNOSIS — Z91199 Patient's noncompliance with other medical treatment and regimen due to unspecified reason: Secondary | ICD-10-CM

## 2023-09-16 NOTE — Progress Notes (Signed)
(  09/16/2023) NO-SHOW to postprocedure evaluation.

## 2023-11-03 ENCOUNTER — Encounter: Payer: Medicare PPO | Admitting: Pain Medicine

## 2023-11-09 NOTE — Patient Instructions (Signed)

## 2023-11-09 NOTE — Progress Notes (Signed)
(  11/10/2023) the patient called on the day of the encounter to cancel.  Precharting review of records indicate that she is actually not using her pain medications any longer and therefore we will not be scheduling her again for medication management.  Will continue to be available for interventional therapies, when needed.  PMP: 1/3 filled from 07/30/23, 1/3 filled from 05/07/23, 1/3 filled from 01/28/23, 1/3 filled from 12/04/22. Call pharmacy and cancel any old scripts.  Pharmacotherapy Assessment  Analgesic: No chronic opioid analgesics therapy prescribed by our practice. MME/day: 0 mg/day.   Note by: Eric DELENA Como, MD Date: 11/10/2023; Time: 1:13 PM

## 2023-11-10 ENCOUNTER — Ambulatory Visit (HOSPITAL_BASED_OUTPATIENT_CLINIC_OR_DEPARTMENT_OTHER): Payer: Medicare PPO | Admitting: Pain Medicine

## 2023-11-10 ENCOUNTER — Telehealth: Payer: Self-pay

## 2023-11-10 DIAGNOSIS — Z79899 Other long term (current) drug therapy: Secondary | ICD-10-CM

## 2023-11-10 DIAGNOSIS — Z91199 Patient's noncompliance with other medical treatment and regimen due to unspecified reason: Secondary | ICD-10-CM

## 2023-11-10 DIAGNOSIS — M47816 Spondylosis without myelopathy or radiculopathy, lumbar region: Secondary | ICD-10-CM

## 2023-11-10 DIAGNOSIS — G8929 Other chronic pain: Secondary | ICD-10-CM

## 2023-11-10 DIAGNOSIS — M5481 Occipital neuralgia: Secondary | ICD-10-CM

## 2023-11-10 DIAGNOSIS — Z09 Encounter for follow-up examination after completed treatment for conditions other than malignant neoplasm: Secondary | ICD-10-CM

## 2023-11-10 DIAGNOSIS — G894 Chronic pain syndrome: Secondary | ICD-10-CM

## 2023-11-10 DIAGNOSIS — Z79891 Long term (current) use of opiate analgesic: Secondary | ICD-10-CM

## 2023-11-10 NOTE — Telephone Encounter (Signed)
 Per Dr. Shauna Hugh request, RN called patient pharmacy, [facility] in [place], to have any of Dr.Naveria prescriptions cancelled. Pharmacy tech states she cancelled active prescriptions from Dr. Laban Emperor.

## 2023-11-13 NOTE — Progress Notes (Signed)
PROVIDER NOTE: Information contained herein reflects review and annotations entered in association with encounter. Interpretation of such information and data should be left to medically-trained personnel. Information provided to patient can be located elsewhere in the medical record under "Patient Instructions". Document created using STT-dictation technology, any transcriptional errors that may result from process are unintentional.    Patient: Karen Edwards  Service Category: E/M  Provider: Oswaldo Done, MD  DOB: 12-Jan-1966  DOS: 11/17/2023  Referring Provider: Melburn Popper, FNP  MRN: [id number]  Specialty: Interventional Pain Management  PCP: Melburn Popper, FNP  Type: Established Patient  Setting: Ambulatory outpatient    Location: Office  Delivery: Face-to-face     HPI  Karen Edwards, a 58 y.o. year old female, is here today because of her Chronic bilateral low back pain without sciatica [M54.50, G89.29]. Karen Edwards primary complain today is Neck Pain and Back Pain (lower)  Pertinent problems: Karen Edwards has Chronic low back pain (1ry area of Pain) (Bilateral) (R>L) w/ sciatica (Bilateral); DDD (degenerative disc disease), lumbar; DDD (degenerative disc disease), cervical; Chronic occipital neuralgia (5th area of Pain) (Bilateral) (R>L); Lumbar facet syndrome (Bilateral) (L>R); DDD (degenerative disc disease), lumbosacral; Lumbar radicular pain (Right) (L4); Sacroiliac joint dysfunction (Bilateral); Chronic neck pain (4th area of Pain) (Bilateral) (R>L); Numbness and tingling of upper extremity (Right); Chronic upper extremity pain (Bilateral) (R>L); Chronic sacroiliac joint pain (Bilateral) (L>R); Lumbar facet hypertrophy (Bilateral); L1-2 disc extrusion (Right); Cervical foraminal stenosis (C5-6) (Bilateral); Chronic pain syndrome; Chronic cervical radicular pain (Right: C6/C7) (Left: C5/T1); Chronic lumbar radicular pain; Chronic hip pain (3ry area of Pain)  (Bilateral) (R>L); Chronic lower extremity pain (2ry area of Pain) (Bilateral) (R>L); Chronic shoulder pain (Bilateral) (L>R); Chronic musculoskeletal pain; Lumbar spondylosis; Cervical facet syndrome (Bilateral) (R>L); Myofascial pain; Spondylosis without myelopathy or radiculopathy, cervical region; Cervicalgia; S/P insertion of Epidural Neurostimulator (SCS) (Bilateral); Chronic neck pain with history of cervical spinal surgery; 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[facility]); Trigger point with back pain (Right); Cervicogenic headache (Bilateral); Occipital headache (Bilateral); Chronic tension-type headache, intractable; Cervico-occipital neuralgia (Bilateral); Pain due to any device, implant or graft (SCS battery) (Left PSIS area); Spondylosis without myelopathy or radiculopathy, lumbosacral region; History of cervical spinal surgery (C5-7 ACDF); Cervical radiculopathy at C6 (Bilateral); Chronic low back pain (Bilateral) w/o sciatica; Presence of neurostimulator (Thoracolumbar); Spinal cord stimulator dysfunction (HCC); Neurogenic pain; Chronic low back pain (Left) w/o sciatica; Cluneal neuropathy (Left); Abnormal MRI, lumbar spine (04/17/2021); Enthesopathy of hip region (Bilateral); Protrusion of lumbar intervertebral disc (Right: L1-2, L3-4); 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 Pain Assessment: Severity of Chronic pain is reported as a 6 /10. Location: Back Lower/neck pain radiates to shoulders, back pain radiates to the knees. Onset: More than a month ago. Quality: Aching, Burning, Stabbing. Timing: Constant. Modifying factor(s): changing positions, Oxycodone. Vitals:  height is 5\' 4"  (1.626 m) and weight is 164 lb (74.4 kg). Her temporal temperature is 97.2 F (36.2 C)  (abnormal). Her blood pressure is 150/98 (abnormal) and her pulse is 90. Her respiration is 16 and oxygen saturation is 99%.  BMI: Estimated body mass index is 28.15 kg/m as calculated from the following:   Height as of this encounter: 5\' 4"  (1.626 m).   Weight as of this encounter: 164 lb (74.4 kg). Last encounter: 11/10/2023. Last procedure:  08/28/2023.  Reason for encounter: post-procedure evaluation and assessment. Precharting review of records indicate that she is actually not using her pain medications any longer and therefore we will not be scheduling her again for medication management.  Will continue to be available for interventional therapies, when needed.   PMP: 1/3 filled from 07/30/23, 1/3 filled from 05/07/23, 1/3 filled from 01/28/23, 1/3 filled from 12/04/22. Call pharmacy and cancel any old scripts.  Discussed the use of AI scribe software for clinical note transcription with the patient, who gave verbal consent to proceed.  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 Prior to the fall, the patient had been experiencing sharp, unilateral pain on the right side of the lower back. However, since the fall, the pain has intensified and spread to the left side as well. The pain does not radiate past the knees.  In the past, the patient has found relief from facet blocks for the lower back pain. She has also been prescribed medication for the pain, but she reports limited use due to constipation side effects. The patient also mentions financial constraints affecting her ability to consistently afford prescribed medication for constipation. Despite these challenges, the patient only takes the pain medication when absolutely necessary, approximately every 20 to 30 days.       Post-procedure  evaluation   Type: Lumbar Facet, Medial Branch Block(s) (w/ fluoroscopic mapping) R4L5   Laterality: Bilateral  Level (Right): L3, L4, L5, and S1 Medial Branch Level(s). Injecting these levels blocks the L4-5 and L5-S1 lumbar facet joints. Level (Left): L2, L3, L4, L5, and S1 Medial Branch Level(s). Injecting these levels blocks the L3-4, L4-5, and L5-S1 lumbar facet joints. Imaging: Fluoroscopic guidance Spinal ([id number]) Anesthesia: Local anesthesia (1-2% Lidocaine) Anxiolysis: IV Versed 4.0 mg Sedation: Moderate Sedation Fentanyl 1 mL (50 mcg) DOS: 08/28/2023 Performed by: Oswaldo Done, MD  Primary Purpose: Diagnostic/Therapeutic Indications: Low back pain severe enough to impact quality of life or function. 1. Chronic low back pain (Bilateral) w/o sciatica   2. Lumbar facet joint pain   3. Lumbar facet syndrome (Bilateral) (L>R)   4. Lumbar facet hypertrophy (Bilateral)   5. Lumbar spondylosis   6. Spondylosis without myelopathy or radiculopathy, lumbosacral region   7. Degeneration of intervertebral disc of lumbosacral region with discogenic back pain   8. Presence of neurostimulator (Thoracolumbar)    NAS-11 Pain score:   Pre-procedure: 2 /10   Post-procedure: 0-No pain/10   Note: SCS working much better after reprogramming. Also taking charge much better.    Effectiveness:  Initial hour after procedure: 100 %. Subsequent 4-6 hours post-procedure: 100 %. Analgesia past initial 6 hours: 20 % (Slight relief at 20% for 1.5 months). Ongoing improvement:  Analgesic: The patient indicates having attained 100% relief of the pain for the duration of the local anesthetic followed by a decrease to a 20% that lasted for 1.5 months.  Above all, she indicates having increasing her ability to function and range of motion. Function: Karen Edwards reports improvement in function ROM: Karen Edwards reports improvement in ROM  Pharmacotherapy Assessment  Analgesic: Oxycodone IR 5  mg, 1 tab p.o. twice daily PRN (#20/90 days).  MME/day: 0 mg/day.   Monitoring: [place] PMP: PDMP reviewed during this encounter.       Pharmacotherapy: No side-effects  or adverse reactions reported. Compliance: No problems identified. Effectiveness: Clinically acceptable.  Karen Elk, RN  11/17/2023 12:49 PM  Sign when Signing Visit Nursing Pain Medication Assessment:  Safety precautions to be maintained throughout the outpatient stay will include: orient to surroundings, keep bed in low position, maintain call bell within reach at all times, provide assistance with transfer out of bed and ambulation.  Medication Inspection Compliance: Karen Edwards did not comply with our request to bring her pills to be counted. She was reminded that bringing the medication bottles, even when empty, is a requirement.  Medication: None brought in. Pill/Patch Count: None available to be counted. Bottle Appearance: No container available. Did not bring bottle(s) to appointment. Filled Date: N/A Last Medication intake:  Yesterday    No results found for: "CBDTHCR" No results found for: "D8THCCBX" No results found for: "D9THCCBX"  UDS:  Summary  Date Value Ref Range Status  07/30/2023 Note  Final    Comment:    ==================================================================== [employer] Select 13 (MW) ==================================================================== Test                             Result       Flag       Units  Drug Present and Declared for Prescription Verification   Noroxycodone                   193          EXPECTED   ng/mg creat    Noroxycodone is an expected metabolite of oxycodone. Sources of    oxycodone include scheduled prescription medications.  Drug Absent but Declared for Prescription Verification   Oxycodone                      Not Detected UNEXPECTED ng/mg creat    Oxycodone is almost always present in patients taking this drug    consistently.  Absence of  oxycodone could be due to lapse of time    since the last dose or unusual pharmacokinetics (rapid metabolism).  ==================================================================== Test                      Result    Flag   Units      Ref Range   Creatinine              80               mg/dL      >=41 ==================================================================== Declared Medications:  The flagging and interpretation on this report are based on the  following declared medications.  Unexpected results may arise from  inaccuracies in the declared medications.   **Note: The testing scope of this panel includes these medications:   Oxycodone (Roxicodone)   **Note: The testing scope of this panel does not include the  following reported medications:   Acetaminophen (Tylenol)  Fluoxetine (Prozac)  Linaclotide (Linzess)  Naloxone (Narcan)  Ondansetron (Zofran)  Quetiapine (Seroquel)  Scopolamine ==================================================================== For clinical consultation, please call [phone]. ====================================================================       ROS  Constitutional: Denies any fever or chills Gastrointestinal: No reported hemesis, hematochezia, vomiting, or acute GI distress Musculoskeletal: Denies any acute onset joint swelling, redness, loss of ROM, or weakness Neurological: No reported episodes of acute onset apraxia, aphasia, dysarthria, agnosia, amnesia, paralysis, loss of coordination, or loss of consciousness  Medication Review  FLUoxetine, QUEtiapine, acetaminophen, albuterol, cefdinir, fluticasone,  linaclotide, montelukast, naloxone, ondansetron, oxyCODONE, promethazine-dextromethorphan, scopolamine, and zolpidem  History Review  Allergy: Ms. Presswood is allergic to gabapentin. Drug: Ms. Warnell  reports no history of drug use. Alcohol:  reports current alcohol use of about 1.0 standard drink of alcohol per  week. Tobacco:  reports that she has been smoking cigarettes. She has a 18.5 pack-year smoking history. She has never used smokeless tobacco. Social: Ms. Warring  reports that she has been smoking cigarettes. She has a 18.5 pack-year smoking history. She has never used smokeless tobacco. She reports current alcohol use of about 1.0 standard drink of alcohol per week. She reports that she does not use drugs. Medical:  has a past medical history of Anxiety, Chronic back pain, Degenerative joint disease, Depression, Headache, Hypotension, IBS (irritable bowel syndrome) (05/2015), MVP (mitral valve prolapse), and Nausea and vomiting. Surgical: Ms. Diamond  has a past surgical history that includes Cesarean section; Ankle surgery (Right, 03/02/2014); Hemorrhoid surgery (N/A, 06/16/2015); Cervical spine surgery (02/21/2016); Cesarean section with bilateral tubal ligation (1990); Colonoscopy (04/19/2015); Hysteroscopy with D & C (12/24/2013); Colonoscopy with propofol (N/A, 05/18/2018); Esophagogastroduodenoscopy (egd) with propofol (N/A, 05/18/2018); Spinal cord stimulator insertion (N/A, 06/18/2018); Tubal ligation; Lumbar spinal cord simulator revision (N/A, 03/14/2020); Colonoscopy with propofol (N/A, 06/25/2023); Colonoscopy with propofol (N/A, 08/07/2023); and polypectomy (08/07/2023). Family: family history includes Arthritis in her mother; Congestive Heart Failure in her maternal grandfather; Depression in her mother; Diabetes in her maternal aunt; Diabetes Mellitus I in her maternal grandmother; Gout in her maternal aunt; Hearing loss in her father; Heart attack in her maternal grandfather; Hyperlipidemia in her father and mother; Hypertension in her father and mother; Hypothyroidism in her mother; Kidney disease in her mother; Prostate cancer in her paternal grandfather; Stroke in her maternal grandmother; Thyroid disease in her maternal aunt; Vision loss in her mother.  Laboratory Chemistry Profile    Renal Lab Results  Component Value Date   BUN 12 02/01/2021   CREATININE 0.85 02/01/2021   BCR 8 (L) 09/08/2017   GFR 77.34 02/01/2021   GFRAA 93 09/08/2017   GFRNONAA 81 09/08/2017    Hepatic Lab Results  Component Value Date   AST 15 02/01/2021   ALT 11 02/01/2021   ALBUMIN 4.4 02/01/2021   ALKPHOS 59 02/01/2021    Electrolytes Lab Results  Component Value Date   NA 139 02/01/2021   K 4.2 02/01/2021   CL 105 02/01/2021   CALCIUM 9.3 02/01/2021   MG 2.1 09/08/2017    Bone Lab Results  Component Value Date   VD25OH 43.74 01/10/2016   25OHVITD1 51 09/08/2017   25OHVITD2 <1.0 09/08/2017   25OHVITD3 51 09/08/2017    Inflammation (CRP: Acute Phase) (ESR: Chronic Phase) Lab Results  Component Value Date   CRP 1.4 09/08/2017   ESRSEDRATE 11 09/08/2017         Note: Above Lab results reviewed.  Recent Imaging Review  DG PAIN CLINIC C-ARM 1-60 MIN NO REPORT Fluoro was used, but no Radiologist interpretation will be provided.  Please refer to "NOTES" tab for provider progress note. Note: Reviewed        Physical Exam  General appearance: Well nourished, well developed, and well hydrated. In no apparent acute distress Mental status: Alert, oriented x 3 (person, place, & time)       Respiratory: No evidence of acute respiratory distress Eyes: PERLA Vitals: BP (!) 150/98   Pulse 90   Temp (!) 97.2 F (36.2 C) (Temporal)   Resp 16  Ht 5\' 4"  (1.626 m)   Wt 164 lb (74.4 kg)   LMP 05/05/2017 (Exact Date)   SpO2 99%   BMI 28.15 kg/m  BMI: Estimated body mass index is 28.15 kg/m as calculated from the following:   Height as of this encounter: 5\' 4"  (1.626 m).   Weight as of this encounter: 164 lb (74.4 kg). Ideal: Ideal body weight: 54.7 kg (120 lb 9.5 oz) Adjusted ideal body weight: 62.6 kg (137 lb 15.3 oz)  Assessment   Diagnosis Status  1. Chronic low back pain (Bilateral) w/o sciatica   2. Lumbar facet joint pain   3. Lumbar facet syndrome  (Bilateral) (L>R)   4. Lumbar facet hypertrophy (Bilateral)   5. Lumbar spondylosis   6. Postop check   7. Chronic low back pain (1ry area of Pain) (Bilateral) (R>L) w/ sciatica (Bilateral)   8. Chronic lower extremity pain (2ry area of Pain) (Bilateral) (R>L)   9. Chronic hip pain (3ry area of Pain) (Bilateral) (R>L)   10. Chronic neck pain (4th area of Pain) (Bilateral) (R>L)   11. Chronic occipital neuralgia (5th area of Pain) (Bilateral) (R>L)   12. Chronic pain syndrome   13. Pharmacologic therapy   14. Chronic use of opiate for therapeutic purpose   15. Encounter for medication management   16. Encounter for chronic pain management    Controlled Controlled Controlled   Updated Problems: No problems updated.  Plan of Care  Problem-specific:  Assessment and Plan    Chronic Lower Back Pain Chronic lower back pain presents with sharp bilateral pain, worsened after a fall one month ago. The pain radiates but does not extend past the knees, with previous right-sided pain noted before the fall. Facet blocks have been beneficial. Post-procedure evaluations are necessary for insurance approval. Prefers sedation during the procedure. Plan to repeat facet blocks with sedation and prescribe pain medication.  Constipation secondary to pain medication Constipation is due to pain medication. Linzess was prescribed by a gastroenterologist, but affordability is an issue. Manages constipation by spacing out pain medication usage. Continue Linzess as needed and as affordable.  Follow-up Schedule a follow-up appointment after facet blocks.       Karen Edwards has a current medication list which includes the following long-term medication(s): albuterol, fluoxetine, fluticasone, linaclotide, montelukast, oxycodone, and quetiapine.  Pharmacotherapy (Medications Ordered): Meds ordered this encounter  Medications   oxyCODONE (OXY IR/ROXICODONE) 5 MG immediate release tablet    Sig: Take  1 tablet (5 mg total) by mouth 2 (two) times daily as needed for severe pain (pain score 7-10). Must last 90 days.    Dispense:  20 tablet    Refill:  0    DO NOT: delete (not duplicate); no partial-fill (will deny script to complete), no refill request (F/U required). DISPENSE: 1 day early if closed on fill date. WARN: No CNS-depressants within 8 hrs of med.   naloxone (NARCAN) nasal spray 4 mg/0.1 mL    Sig: Place 1 spray into the nose as needed for up to 365 doses (for opioid-induced respiratory depresssion). In case of emergency (overdose), spray once into each nostril. If no response within 3 minutes, repeat application and call 911.    Dispense:  1 each    Refill:  1    Instruct patient in proper use of device.   Orders:  Orders Placed This Encounter  Procedures   LUMBAR FACET(MEDIAL BRANCH NERVE BLOCK) MBNB    Diagnosis: Lumbar Facet Syndrome (M47.816); Lumbosacral  Facet Syndrome (M47.817); Lumbar Facet Joint Pain (M54.59) Medical Necessity Statement: 1.Severe chronic axial low back pain causing functional impairment documented by ongoing pain scale assessments. 2.Pain present for longer than 3 months (Chronic) documented to have failed noninvasive conservative therapies. 3.Absence of untreated radiculopathy. 4.There is no radiological evidence of untreated fractures, tumor, infection, or deformity.  Physical Examination Findings: Positive Kemp Maneuver: (Y)  Positive Lumbar Hyperextension-Rotation provocative test: (Y)    Standing Status:   Future    Expiration Date:   02/15/2024    Scheduling Instructions:     Procedure: Lumbar facet Block     Type: Medial Branch Block     Side: Bilateral     Purpose: Palliative     Level(s): L3-4, L4-5, L5-S1, and TBD by Fluoroscopic Mapping Facets (L2, L3, L4, L5, S1, and TBD Medial Branch)     Sedation: With Sedation.     Timeframe: ASAP    Where will this procedure be performed?:   [facility]   Nursing Instructions:    Please  complete this patient's postprocedure evaluation.    Scheduling Instructions:     Please complete this patient's postprocedure evaluation.   Follow-up plan:   Return for (ECT): (B) L-FCT Blk #R5L6.      Interventional Therapies  Risk Factors  Considerations:   Mitral valve prolapse (MVP) GERD  urinary stress incontinence  GAD   Planned  Pending:   Diagnostic/therapeutic bilateral lumbar facet MBB R4L5    Under consideration:   Therapeutic right L1-2 & L3-4 percutaneous discectomy ([employer] compressor)  Therapeutic intrathecal pump trial    Completed:   Therapeutic right cervical ESI x2 (12/17/2022) (100/100/90/100)  Diagnostic bilateral GONB x1 (08/19/2019) (100/100/100/,50)  Palliative right cervical facet MBB x3 (07/23/2018) (100/100/100/>50)  Palliative left cervical facet MBB x4 (04/01/2019) (90/90/0/0)  Palliative right cervical facet RFA x1 (09/17/2018) (100/100/85/85)  Palliative left cervical facet RFA x1 (05/04/2019) (100/100/50/50)  Therapeutic left L1-2 LESI x1 (05/10/2021) (100/100/100 x 2 days/0)  Therapeutic right L1-2 LESI x2 (02/10/2018) (100/100/70/50-75)  Therapeutic right L1 TFESI x1 (02/10/2018) (100/100/70/50-75)  Diagnostic/therapeutic left superior cluneal NB (L2, L3 dorsal rami) x1 (04/24/2021) (0/0/0/0)  Diagnostic/therapeutic left L1 TFESI x2 (11/15/2021) (100/100/0)  Diagnostic/therapeutic left L3 TFESI x2 (11/15/2021) (100/100/0)  Bilateral lumbar spinal cord stimulator trial (done) (05/14/2018) (by me)  Bilateral lumbar spinal cord stimulator implant (09/28/2019) (by me)  Bilateral spinal cord stimulator revision (03/14/2020) (by me)  Diagnostic right lumbar facet MBB x4 (08/28/2023) (100/100/20/20) (did not keep follow-up appointment) Diagnostic left lumbar facet MBB x5 (08/28/2023) (100/100/20/20) (did not keep follow-up appointment) Diagnostic bilateral SI joint Blk x2 (09/18/2021) (100/100/100)  Palliative left lumbar facet RFA x3 (06/27/2022)  (100/100/100/100)  Therapeutic right lumbar facet RFA x3 (05/21/2022) (100/100/75/75)    Therapeutic  Palliative (PRN) options:   Palliative right cervical facet RFA   Diagnostic bilateral SI joint Blk   Therapeutic bilateral lumbar facet RFA    Pharmacotherapy  Nonopioids transfer 08/23/2020: Lidocaine 5% ointment and Robaxin       Recent Visits Date Type Provider Dept  08/28/23 Procedure visit Delano Metz, MD [facility]  Showing recent visits within past 90 days and meeting all other requirements Today's Visits Date Type Provider Dept  11/17/23 Office Visit Delano Metz, MD [facility]  Showing today's visits and meeting all other requirements Future Appointments No visits were found meeting these conditions. Showing future appointments within next 90 days and meeting all other requirements  I discussed the assessment and treatment plan with  the patient. The patient was provided an opportunity to ask questions and all were answered. The patient agreed with the plan and demonstrated an understanding of the instructions.  Patient advised to call back or seek an in-person evaluation if the symptoms or condition worsens.  Duration of encounter: 30 minutes.  Total time on encounter, as per AMA guidelines included both the face-to-face and non-face-to-face time personally spent by the physician and/or other qualified health care professional(s) on the day of the encounter (includes time in activities that require the physician or other qualified health care professional and does not include time in activities normally performed by clinical staff). Physician's time may include the following activities when performed: Preparing to see the patient (e.g., pre-charting review of records, searching for previously ordered imaging, lab work, and nerve conduction tests) Review of prior analgesic pharmacotherapies. Reviewing PMP Interpreting ordered tests (e.g.,  lab work, imaging, nerve conduction tests) Performing post-procedure evaluations, including interpretation of diagnostic procedures Obtaining and/or reviewing separately obtained history Performing a medically appropriate examination and/or evaluation Counseling and educating the patient/family/caregiver Ordering medications, tests, or procedures Referring and communicating with other health care professionals (when not separately reported) Documenting clinical information in the electronic or other health record Independently interpreting results (not separately reported) and communicating results to the patient/ family/caregiver Care coordination (not separately reported)  Note by: Oswaldo Done, MD Date: 11/17/2023; Time: 1:16 PM

## 2023-11-17 ENCOUNTER — Encounter: Payer: Medicare PPO | Admitting: Pain Medicine

## 2023-11-17 ENCOUNTER — Ambulatory Visit: Payer: Medicare PPO | Attending: Pain Medicine | Admitting: Pain Medicine

## 2023-11-17 ENCOUNTER — Encounter: Payer: Self-pay | Admitting: Pain Medicine

## 2023-11-17 VITALS — BP 150/98 | HR 90 | Temp 97.2°F | Resp 16 | Ht 64.0 in | Wt 164.0 lb

## 2023-11-17 DIAGNOSIS — M79605 Pain in left leg: Secondary | ICD-10-CM | POA: Diagnosis present

## 2023-11-17 DIAGNOSIS — M545 Low back pain, unspecified: Secondary | ICD-10-CM | POA: Diagnosis present

## 2023-11-17 DIAGNOSIS — M5442 Lumbago with sciatica, left side: Secondary | ICD-10-CM | POA: Diagnosis not present

## 2023-11-17 DIAGNOSIS — G894 Chronic pain syndrome: Secondary | ICD-10-CM | POA: Diagnosis present

## 2023-11-17 DIAGNOSIS — M5459 Other low back pain: Secondary | ICD-10-CM | POA: Diagnosis present

## 2023-11-17 DIAGNOSIS — M79604 Pain in right leg: Secondary | ICD-10-CM | POA: Diagnosis present

## 2023-11-17 DIAGNOSIS — M25551 Pain in right hip: Secondary | ICD-10-CM | POA: Diagnosis present

## 2023-11-17 DIAGNOSIS — M542 Cervicalgia: Secondary | ICD-10-CM | POA: Diagnosis present

## 2023-11-17 DIAGNOSIS — Z79899 Other long term (current) drug therapy: Secondary | ICD-10-CM | POA: Diagnosis present

## 2023-11-17 DIAGNOSIS — Z09 Encounter for follow-up examination after completed treatment for conditions other than malignant neoplasm: Secondary | ICD-10-CM | POA: Insufficient documentation

## 2023-11-17 DIAGNOSIS — M25552 Pain in left hip: Secondary | ICD-10-CM | POA: Insufficient documentation

## 2023-11-17 DIAGNOSIS — M5481 Occipital neuralgia: Secondary | ICD-10-CM | POA: Diagnosis present

## 2023-11-17 DIAGNOSIS — Z79891 Long term (current) use of opiate analgesic: Secondary | ICD-10-CM | POA: Diagnosis present

## 2023-11-17 DIAGNOSIS — M5441 Lumbago with sciatica, right side: Secondary | ICD-10-CM | POA: Insufficient documentation

## 2023-11-17 DIAGNOSIS — M47816 Spondylosis without myelopathy or radiculopathy, lumbar region: Secondary | ICD-10-CM | POA: Insufficient documentation

## 2023-11-17 DIAGNOSIS — G8929 Other chronic pain: Secondary | ICD-10-CM | POA: Insufficient documentation

## 2023-11-17 MED ORDER — OXYCODONE HCL 5 MG PO TABS: 5.0000 mg | ORAL_TABLET | Freq: Two times a day (BID) | ORAL | 0 refills | Status: DC | PRN

## 2023-11-17 MED ORDER — NALOXONE HCL 4 MG/0.1ML NA LIQD: 1.0000 | NASAL | 1 refills | Status: AC | PRN

## 2023-11-17 NOTE — Patient Instructions (Signed)
______________________________________________________________________    Procedure instructions  Stop blood-thinners  Do not eat or drink fluids (other than water) for 6 hours before your procedure  No water for 2 hours before your procedure  Take your blood pressure medicine with a sip of water  Arrive 30 minutes before your appointment  If sedation is planned, bring suitable driver. Pennie Banter, Benedetto Goad, & public transportation are NOT APPROVED)  Carefully read the "Preparing for your procedure" detailed instructions  If you have questions call us at [phone]  Procedure appointments are for procedures only. NO medication refills or new problem evaluations.   ______________________________________________________________________      ______________________________________________________________________    Preparing for your procedure  Appointments: If you think you may not be able to keep your appointment, call 24-48 hours in advance to cancel. We need time to make it available to others.  Procedure visits are for procedures only. During your procedure appointment there will be: NO Prescription Refills*. NO medication changes or discussions*. NO discussion of disability issues*. NO unrelated pain problem evaluations*. NO evaluations to order other pain procedures*. *These will be addressed at a separate and distinct evaluation encounter on the provider's evaluation schedule and not during procedure days.  Instructions: Food intake: Avoid eating anything solid for at least 8 hours prior to your procedure. Clear liquid intake: You may take clear liquids such as water up to 2 hours prior to your procedure. (No carbonated drinks. No soda.) Transportation: Unless otherwise stated by your physician, bring a driver. (Driver cannot be a Market researcher, Pharmacist, community, or any other form of public transportation.) Morning Medicines: Except for blood thinners, take all of your other morning  medications with a sip of water. Make sure to take your heart and blood pressure medicines. If your blood pressure's lower number is above 100, the case will be rescheduled. Blood thinners: Make sure to stop your blood thinners as instructed.  If you take a blood thinner, but were not instructed to stop it, call our office [phone] and ask to talk to a nurse. Not stopping a blood thinner prior to certain procedures could lead to serious complications. Diabetics on insulin: Notify the staff so that you can be scheduled 1st case in the morning. If your diabetes requires high dose insulin, take only  of your normal insulin dose the morning of the procedure and notify the staff that you have done so. Preventing infections: Shower with an antibacterial soap the morning of your procedure.  Build-up your immune system: Take 1000 mg of Vitamin C with every meal (3 times a day) the day prior to your procedure. Antibiotics: Inform the nursing staff if you are taking any antibiotics or if you have any conditions that may require antibiotics prior to procedures. (Example: recent joint implants)   Pregnancy: If you are pregnant make sure to notify the nursing staff. Not doing so may result in injury to the fetus, including death.  Sickness: If you have a cold, fever, or any active infections, call and cancel or reschedule your procedure. Receiving steroids while having an infection may result in complications. Arrival: You must be in the facility at least 30 minutes prior to your scheduled procedure. Tardiness: Your scheduled time is also the cutoff time. If you do not arrive at least 15 minutes prior to your procedure, you will be rescheduled.  Children: Do not bring any children with you. Make arrangements to keep them home. Dress appropriately: There is always a possibility that your clothing may get  soiled. Avoid long dresses. Valuables: Do not bring any jewelry or valuables.  Reasons to call and  reschedule or cancel your procedure: (Following these recommendations will minimize the risk of a serious complication.) Surgeries: Avoid having procedures within 2 weeks of any surgery. (Avoid for 2 weeks before or after any surgery). Flu Shots: Avoid having procedures within 2 weeks of a flu shots or . (Avoid for 2 weeks before or after immunizations). Barium: Avoid having a procedure within 7-10 days after having had a radiological study involving the use of radiological contrast. (Myelograms, Barium swallow or enema study). Heart attacks: Avoid any elective procedures or surgeries for the initial 6 months after a "Myocardial Infarction" (Heart Attack). Blood thinners: It is imperative that you stop these medications before procedures. Let us know if you if you take any blood thinner.  Infection: Avoid procedures during or within two weeks of an infection (including chest colds or gastrointestinal problems). Symptoms associated with infections include: Localized redness, fever, chills, night sweats or profuse sweating, burning sensation when voiding, cough, congestion, stuffiness, runny nose, sore throat, diarrhea, nausea, vomiting, cold or Flu symptoms, recent or current infections. It is specially important if the infection is over the area that we intend to treat. Heart and lung problems: Symptoms that may suggest an active cardiopulmonary problem include: cough, chest pain, breathing difficulties or shortness of breath, dizziness, ankle swelling, uncontrolled high or unusually low blood pressure, and/or palpitations. If you are experiencing any of these symptoms, cancel your procedure and contact your primary care physician for an evaluation.  Remember:  Regular Business hours are:  Monday to Thursday 8:00 AM to 4:00 PM  Provider's Schedule: Delano Metz, MD:  Procedure days: Tuesday and Thursday 7:30 AM to 4:00 PM  Edward Jolly, MD:  Procedure days: Monday and Wednesday 7:30 AM to 4:00  PM Last  Updated: 10/07/2023 ______________________________________________________________________      ______________________________________________________________________    General Risks and Possible Complications  Patient Responsibilities: It is important that you read this as it is part of your informed consent. It is our duty to inform you of the risks and possible complications associated with treatments offered to you. It is your responsibility as a patient to read this and to ask questions about anything that is not clear or that you believe was not covered in this document.  Patient's Rights: You have the right to refuse treatment. You also have the right to change your mind, even after initially having agreed to have the treatment done. However, under this last option, if you wait until the last second to change your mind, you may be charged for the materials used up to that point.  Introduction: Medicine is not an Visual merchandiser. Everything in Medicine, including the lack of treatment(s), carries the potential for danger, harm, or loss (which is by definition: Risk). In Medicine, a complication is a secondary problem, condition, or disease that can aggravate an already existing one. All treatments carry the risk of possible complications. The fact that a side effects or complications occurs, does not imply that the treatment was conducted incorrectly. It must be clearly understood that these can happen even when everything is done following the highest safety standards.  No treatment: You can choose not to proceed with the proposed treatment alternative. The "PRO(s)" would include: avoiding the risk of complications associated with the therapy. The "CON(s)" would include: not getting any of the treatment benefits. These benefits fall under one of three categories: diagnostic; therapeutic; and/or  palliative. Diagnostic benefits include: getting information which can ultimately lead to  improvement of the disease or symptom(s). Therapeutic benefits are those associated with the successful treatment of the disease. Finally, palliative benefits are those related to the decrease of the primary symptoms, without necessarily curing the condition (example: decreasing the pain from a flare-up of a chronic condition, such as incurable terminal cancer).  General Risks and Complications: These are associated to most interventional treatments. They can occur alone, or in combination. They fall under one of the following six (6) categories: no benefit or worsening of symptoms; bleeding; infection; nerve damage; allergic reactions; and/or death. No benefits or worsening of symptoms: In Medicine there are no guarantees, only probabilities. No healthcare provider can ever guarantee that a medical treatment will work, they can only state the probability that it may. Furthermore, there is always the possibility that the condition may worsen, either directly, or indirectly, as a consequence of the treatment. Bleeding: This is more common if the patient is taking a blood thinner, either prescription or over the counter (example: Goody Powders, Fish oil, Aspirin, Garlic, etc.), or if suffering a condition associated with impaired coagulation (example: Hemophilia, cirrhosis of the liver, low platelet counts, etc.). However, even if you do not have one on these, it can still happen. If you have any of these conditions, or take one of these drugs, make sure to notify your treating physician. Infection: This is more common in patients with a compromised immune system, either due to disease (example: diabetes, cancer, human immunodeficiency virus [HIV], etc.), or due to medications or treatments (example: therapies used to treat cancer and rheumatological diseases). However, even if you do not have one on these, it can still happen. If you have any of these conditions, or take one of these drugs, make sure to notify  your treating physician. Nerve Damage: This is more common when the treatment is an invasive one, but it can also happen with the use of medications, such as those used in the treatment of cancer. The damage can occur to small secondary nerves, or to large primary ones, such as those in the spinal cord and brain. This damage may be temporary or permanent and it may lead to impairments that can range from temporary numbness to permanent paralysis and/or brain death. Allergic Reactions: Any time a substance or material comes in contact with our body, there is the possibility of an allergic reaction. These can range from a mild skin rash (contact dermatitis) to a severe systemic reaction (anaphylactic reaction), which can result in death. Death: In general, any medical intervention can result in death, most of the time due to an unforeseen complication. ______________________________________________________________________      ______________________________________________________________________    Opioid Pain Medication Update  To: All patients taking opioid pain medications. (I.e.: hydrocodone, hydromorphone, oxycodone, oxymorphone, morphine, codeine, methadone, tapentadol, tramadol, buprenorphine, fentanyl, etc.)  Re: Updated review of side effects and adverse reactions of opioid analgesics, as well as new information about long term effects of this class of medications.  Direct risks of long-term opioid therapy are not limited to opioid addiction and overdose. Potential medical risks include serious fractures, breathing problems during sleep, hyperalgesia, immunosuppression, chronic constipation, bowel obstruction, myocardial infarction, and tooth decay secondary to xerostomia.  Unpredictable adverse effects that can occur even if you take your medication correctly: Cognitive impairment, respiratory depression, and death. Most people think that if they take their medication "correctly", and "as  instructed", that they will be safe. Nothing  could be farther from the truth. In reality, a significant amount of recorded deaths associated with the use of opioids has occurred in individuals that had taken the medication for a long time, and were taking their medication correctly. The following are examples of how this can happen: Patient taking his/her medication for a long time, as instructed, without any side effects, is given a certain antibiotic or another unrelated medication, which in turn triggers a "Drug-to-drug interaction" leading to disorientation, cognitive impairment, impaired reflexes, respiratory depression or an untoward event leading to serious bodily harm or injury, including death.  Patient taking his/her medication for a long time, as instructed, without any side effects, develops an acute impairment of liver and/or kidney function. This will lead to a rapid inability of the body to breakdown and eliminate their pain medication, which will result in effects similar to an "overdose", but with the same medicine and dose that they had always taken. This again may lead to disorientation, cognitive impairment, impaired reflexes, respiratory depression or an untoward event leading to serious bodily harm or injury, including death.  A similar problem will occur with patients as they grow older and their liver and kidney function begins to decrease as part of the aging process.  Background information: Historically, the original case for using long-term opioid therapy to treat chronic noncancer pain was based on safety assumptions that subsequent experience has called into question. In 1996, the American Pain Society and the American Academy of Pain Medicine issued a consensus statement supporting long-term opioid therapy. This statement acknowledged the dangers of opioid prescribing but concluded that the risk for addiction was low; respiratory depression induced by opioids was short-lived,  occurred mainly in opioid-naive patients, and was antagonized by pain; tolerance was not a common problem; and efforts to control diversion should not constrain opioid prescribing. This has now proven to be wrong. Experience regarding the risks for opioid addiction, misuse, and overdose in community practice has failed to support these assumptions.  According to the Centers for Disease Control and Prevention, fatal overdoses involving opioid analgesics have increased sharply over the past decade. Currently, more than 96,700 people die from drug overdoses every year. Opioids are a factor in 7 out of every 10 overdose deaths. Deaths from drug overdose have surpassed motor vehicle accidents as the leading cause of death for individuals between the ages of 67 and 64.  Clinical data suggest that neuroendocrine dysfunction may be very common in both men and women, potentially causing hypogonadism, erectile dysfunction, infertility, decreased libido, osteoporosis, and depression. Recent studies linked higher opioid dose to increased opioid-related mortality. Controlled observational studies reported that long-term opioid therapy may be associated with increased risk for cardiovascular events. Subsequent meta-analysis concluded that the safety of long-term opioid therapy in elderly patients has not been proven.   Side Effects and adverse reactions: Common side effects: Drowsiness (sedation). Dizziness. Nausea and vomiting. Constipation. Physical dependence -- Dependence often manifests with withdrawal symptoms when opioids are discontinued or decreased. Tolerance -- As you take repeated doses of opioids, you require increased medication to experience the same effect of pain relief. Respiratory depression -- This can occur in healthy people, especially with higher doses. However, people with COPD, asthma or other lung conditions may be even more susceptible to fatal respiratory impairment.  Uncommon side  effects: An increased sensitivity to feeling pain and extreme response to pain (hyperalgesia). Chronic use of opioids can lead to this. Delayed gastric emptying (the process by which the contents of  your stomach are moved into your small intestine). Muscle rigidity. Immune system and hormonal dysfunction. Quick, involuntary muscle jerks (myoclonus). Arrhythmia. Itchy skin (pruritus). Dry mouth (xerostomia).  Long-term side effects: Chronic constipation. Sleep-disordered breathing (SDB). Increased risk of bone fractures. Hypothalamic-pituitary-adrenal dysregulation. Increased risk of overdose.  RISKS: Respiratory depression and death: Opioids increase the risk of respiratory depression and death.  Drug-to-drug interactions: Opioids are relatively contraindicated in combination with benzodiazepines, sleep inducers, and other central nervous system depressants. Other classes of medications (i.e.: certain antibiotics and even over-the-counter medications) may also trigger or induce respiratory depression in some patients.  Medical conditions: Patients with pre-existing respiratory problems are at higher risk of respiratory failure and/or depression when in combination with opioid analgesics. Opioids are relatively contraindicated in some medical conditions such as central sleep apnea.   Fractures and Falls:  Opioids increase the risk and incidence of falls. This is of particular importance in elderly patients.  Endocrine System:  Long-term administration is associated with endocrine abnormalities (endocrinopathies). (Also known as Opioid-induced Endocrinopathy) Influences on both the hypothalamic-pituitary-adrenal axis?and the hypothalamic-pituitary-gonadal axis have been demonstrated with consequent hypogonadism and adrenal insufficiency in both sexes. Hypogonadism and decreased levels of dehydroepiandrosterone sulfate have been reported in men and women. Endocrine effects  include: Amenorrhoea in women (abnormal absence of menstruation) Reduced libido in both sexes Decreased sexual function Erectile dysfunction in men Hypogonadisms (decreased testicular function with shrinkage of testicles) Infertility Depression and fatigue Loss of muscle mass Anxiety Depression Immune suppression Hyperalgesia Weight gain Anemia Osteoporosis Patients (particularly women of childbearing age) should avoid opioids. There is insufficient evidence to recommend routine monitoring of asymptomatic patients taking opioids in the long-term for hormonal deficiencies.  Immune System: Human studies have demonstrated that opioids have an immunomodulating effect. These effects are mediated via opioid receptors both on immune effector cells and in the central nervous system. Opioids have been demonstrated to have adverse effects on antimicrobial response and anti-tumour surveillance. Buprenorphine has been demonstrated to have no impact on immune function.  Opioid Induced Hyperalgesia: Human studies have demonstrated that prolonged use of opioids can lead to a state of abnormal pain sensitivity, sometimes called opioid induced hyperalgesia (OIH). Opioid induced hyperalgesia is not usually seen in the absence of tolerance to opioid analgesia. Clinically, hyperalgesia may be diagnosed if the patient on long-term opioid therapy presents with increased pain. This might be qualitatively and anatomically distinct from pain related to disease progression or to breakthrough pain resulting from development of opioid tolerance. Pain associated with hyperalgesia tends to be more diffuse than the pre-existing pain and less defined in quality. Management of opioid induced hyperalgesia requires opioid dose reduction.  Cancer: Chronic opioid therapy has been associated with an increased risk of cancer among noncancer patients with chronic pain. This association was more evident in chronic strong  opioid users. Chronic opioid consumption causes significant pathological changes in the small intestine and colon. Epidemiological studies have found that there is a link between opium dependence and initiation of gastrointestinal cancers. Cancer is the second leading cause of death after cardiovascular disease. Chronic use of opioids can cause multiple conditions such as GERD, immunosuppression and renal damage as well as carcinogenic effects, which are associated with the incidence of cancers.   Mortality: Long-term opioid use has been associated with increased mortality among patients with chronic non-cancer pain (CNCP).  Prescription of long-acting opioids for chronic noncancer pain was associated with a significantly increased risk of all-cause mortality, including deaths from causes other than overdose.  Reference:  Von Korff M, Kolodny A, Deyo RA, Chou R. Long-term opioid therapy reconsidered. Ann Intern Med. 2011 Sep 6;155(5):325-8. doi: 10.7326/[id number]. PMID: 16109604; PMCID: [id number]. Randon Goldsmith, Hayward RA, Dunn KM, [place] KP. Risk of adverse events in patients prescribed long-term opioids: A cohort study in the [place] [facility] [employer]. Eur J Pain. 2019 May;23(5):908-922. doi: 10.1002/ejp.1357. Epub 2019 Jan 31. PMID: 78295621. Colameco S, Coren JS, [place] CA. Continuous opioid treatment for chronic noncancer pain: a time for moderation in prescribing. Postgrad Med. 2009 Jul;121(4):61-6. doi: 10.3810/pgm.2009.07.2032. PMID: 30865784. William Hamburger RN, [place], Blazina I, Cristopher [place], Bougatsos C, Deyo RA. The effectiveness and risks of long-term opioid therapy for chronic pain: a systematic review for a [employer] to [employer]. Ann Intern Med. 2015 Feb 17;162(4):276-86. doi: 10.7326/M14-2559. PMID: 69629528. Caryl Bis [facility], [place] DM. NCHS Data Brief No. 22. [place]: Centers  for Disease Control and Prevention; 2009. Sep, Increase in Fatal Poisonings Involving Opioid Analgesics in the [place], 1999-2006. Song IA, Choi HR, Oh TK. Long-term opioid use and mortality in patients with chronic non-cancer pain: Ten-year follow-up study in [place] from 2010 through 2019. EClinicalMedicine. 2022 Jul 18;51:101558. doi: 10.1016/j.eclinm.2022.413244. PMID: 01027253; PMCID: [id number]. Huser, W., Schubert, T., Vogelmann, T. et al. All-cause mortality in patients with long-term opioid therapy compared with non-opioid analgesics for chronic non-cancer pain: a database study. BMC Med 18, 162 (2020). http://lester.info/ Rashidian H, Karie Kirks, Malekzadeh R, Haghdoost AA. An Ecological Study of the Association between Opiate Use and Incidence of Cancers. Addict Health. 2016 Fall;8(4):252-260. PMID: 59563875; PMCID: [id number].  Our Goal: Our goal is to control your pain with means other than the use of opioid pain medications.  Our Recommendation: Talk to your physician about coming off of these medications. We can assist you with the tapering down and stopping these medicines. Based on the new information, even if you cannot completely stop the medication, a decrease in the dose may be associated with a lesser risk. Ask for other means of controlling the pain. Decrease or eliminate those factors that significantly contribute to your pain such as smoking, obesity, and a diet heavily tilted towards "inflammatory" nutrients.  Last Updated: 05/05/2023   ______________________________________________________________________       ______________________________________________________________________    National Pain Medication Shortage  The [place] is experiencing worsening drug shortages. These have had a negative widespread effect on patient care and treatment. Not expected to improve any time soon. Predicted to last past 2029.   Drug  shortage list (generic names) Oxycodone IR Oxycodone/APAP Oxymorphone IR Hydromorphone Hydrocodone/APAP Morphine  Where is the problem?  Manufacturing and supply level.  Will this shortage affect you?  Only if you take any of the above pain medications.  How? You may be unable to fill your prescription.  Your pharmacist may offer a "partial fill" of your prescription. (Warning: Do not accept partial fills.) Prescriptions partially filled cannot be transferred to another pharmacy. Read our Medication Rules and Regulation. Depending on how much medicine you are dependent on, you may experience withdrawals when unable to get the medication.  Recommendations: Consider ending your dependence on opioid pain medications. Ask your pain specialist to assist you with the process. Consider switching to a medication currently not in shortage, such as Buprenorphine. Talk to your pain specialist about this option. Consider decreasing your pain medication requirements by managing tolerance thru "Drug Holidays". This may help minimize withdrawals, should you run  out of medicine. Control your pain thru the use of non-pharmacological interventional therapies.   Your prescriber: Prescribers cannot be blamed for shortages. Medication manufacturing and supply issues cannot be fixed by the prescriber.   NOTE: The prescriber is not responsible for supplying the medication, or solving supply issues. Work with your pharmacist to solve it. The patient is responsible for the decision to take or continue taking the medication and for identifying and securing a legal supply source. By law, supplying the medication is the job and responsibility of the pharmacy. The prescriber is responsible for the evaluation, monitoring, and prescribing of these medications.   Prescribers will NOT: Re-issue prescriptions that have been partially filled. Re-issue prescriptions already sent to a pharmacy.  Re-send prescriptions to  a different pharmacy because yours did not have your medication. Ask pharmacist to order more medicine or transfer the prescription to another pharmacy. (Read below.)  New 2023 regulation: "June 28, 2022 Revised Regulation Allows DEA-Registered Pharmacies to Transfer Electronic Prescriptions at a Patient's Request DEA Headquarters Division - Public Information Office Patients now have the ability to request their electronic prescription be transferred to another pharmacy without having to go back to their practitioner to initiate the request. This revised regulation went into effect on Monday, June 24, 2022.     At a patient's request, a DEA-registered retail pharmacy can now transfer an electronic prescription for a controlled substance (schedules II-V) to another DEA-registered retail pharmacy. Prior to this change, patients would have to go through their practitioner to cancel their prescription and have it re-issued to a different pharmacy. The process was taxing and time consuming for both patients and practitioners.    The Drug Enforcement Administration [facility]) published its intent to revise the process for transferring electronic prescriptions on September 15, 2020.  The final rule was published in the federal register on May 23, 2022 and went into effect 30 days later.  Under the final rule, a prescription can only be transferred once between pharmacies, and only if allowed under existing state or other applicable law. The prescription must remain in its electronic form; may not be altered in any way; and the transfer must be communicated directly between two licensed pharmacists. It's important to note, any authorized refills transfer with the original prescription, which means the entire prescription will be filled at the same pharmacy".  Reference: HugeHand.is [facility] website  announcement)  [email or web address] Financial pl[employer] of [employer])   [employer] / Vol. 88, No. 143 / Thursday, May 23, 2022 / Rules and Regulations DEPARTMENT OF JUSTICE  Drug Enforcement Administration  21 CFR Part 1306  [Docket No. [id number]]  RIN [id number] Transfer of Electronic Prescriptions for Schedules II-V Controlled Substances Between Pharmacies for Initial Filling  ______________________________________________________________________       ______________________________________________________________________    Transfer of Pain Medication between Pharmacies  Re: 2023 DEA Clarification on existing regulation  Published on DEA Website: June 28, 2022  Title: Revised Regulation Allows DEA-Registered Pharmacies to Electrical engineer Prescriptions at a Patient's Request DEA Headquarters Division - [employer]  "Patients now have the ability to request their electronic prescription be transferred to another pharmacy without having to go back to their practitioner to initiate the request. This revised regulation went into effect on Monday, June 24, 2022.     At a patient's request, a DEA-registered retail pharmacy can now transfer an electronic prescription for a controlled substance (schedules II-V) to another DEA-registered retail pharmacy. Prior to this change, patients would  have to go through their practitioner to cancel their prescription and have it re-issued to a different pharmacy. The process was taxing and time consuming for both patients and practitioners.    The Drug Enforcement Administration [facility]) published its intent to revise the process for transferring electronic prescriptions on September 15, 2020.  The final rule was published in the federal register on May 23, 2022 and went into effect 30 days later.  Under the final rule, a prescription can only be transferred once between  pharmacies, and only if allowed under existing state or other applicable law. The prescription must remain in its electronic form; may not be altered in any way; and the transfer must be communicated directly between two licensed pharmacists. It's important to note, any authorized refills transfer with the original prescription, which means the entire prescription will be filled at the same pharmacy."    REFERENCES: 1. DEA website announcement [email or web address]  2. Department of Justice website  [email or web address]  3. DEPARTMENT OF JUSTICE Drug Enforcement Administration 21 CFR Part 1306 [Docket No. [id number]] RIN [id number] "Transfer of Electronic Prescriptions for Schedules II-V Controlled Substances Between Pharmacies for Initial Filling"  ______________________________________________________________________       ______________________________________________________________________    Medication Rules  Purpose: To inform patients, and their family members, of our medication rules and regulations.  Applies to: All patients receiving prescriptions from our practice (written or electronic).  Pharmacy of record: This is the pharmacy where your electronic prescriptions will be sent. Make sure we have the correct one.  Electronic prescriptions: In compliance with the [facility] Strengthen Opioid Misuse Prevention (STOP) Act of 2017 (Session Conni Elliot [phone]), effective October 28, 2018, all controlled substances must be electronically prescribed. Written prescriptions, faxing, or calling prescriptions to a pharmacy will no longer be done.  Prescription refills: These will be provided only during in-person appointments. No medications will be renewed without a "face-to-face" evaluation with your provider. Applies to all prescriptions.  NOTE:  The following applies primarily to controlled substances (Opioid* Pain Medications).   Type of encounter (visit): For patients receiving controlled substances, face-to-face visits are required. (Not an option and not up to the patient.)  Patient's Responsibilities: Pain Pills: Bring all pain pills to every appointment (except for procedure appointments). Pill counts are required.  Pill Bottles: Bring pills in original pharmacy bottle. Bring bottle, even if empty. Always bring the bottle of the most recent fill.  Medication refills: You are responsible for knowing and keeping track of what medications you are taking and when is it that you will need a refill. The day before your appointment: write a list of all prescriptions that need to be refilled. The day of the appointment: give the list to the admitting nurse. Prescriptions will be written only during appointments. No prescriptions will be written on procedure days. If you forget a medication: it will not be "Called in", "Faxed", or "electronically sent". You will need to get another appointment to get these prescribed. No early refills. Do not call asking to have your prescription filled early. Partial  or short prescriptions: Occasionally your pharmacy may not have enough pills to fill your prescription.  NEVER ACCEPT a partial fill or a prescription that is short of the total amount of pills that you were prescribed.  With controlled substances the law allows 72 hours for the pharmacy to complete the prescription.  If the prescription is not completed within 72 hours, the pharmacist will require a new prescription to be written. This means  that you will be short on your medicine and we WILL NOT send another prescription to complete your original prescription.  Instead, request the pharmacy to send a carrier to a nearby branch to get enough medication to provide you with your full prescription. Prescription Accuracy: You are responsible for  carefully inspecting your prescriptions before leaving our office. Have the discharge nurse carefully go over each prescription with you, before taking them home. Make sure that your name is accurately spelled, that your address is correct. Check the name and dose of your medication to make sure it is accurate. Check the number of pills, and the written instructions to make sure they are clear and accurate. Make sure that you are given enough medication to last until your next medication refill appointment. Taking Medication: Take medication as prescribed. When it comes to controlled substances, taking less pills or less frequently than prescribed is permitted and encouraged. Never take more pills than instructed. Never take the medication more frequently than prescribed.  Inform other Doctors: Always inform, all of your healthcare providers, of all the medications you take. Pain Medication from other Providers: You are not allowed to accept any additional pain medication from any other Doctor or Healthcare provider. There are two exceptions to this rule. (see below) In the event that you require additional pain medication, you are responsible for notifying us, as stated below. Cough Medicine: Often these contain an opioid, such as codeine or hydrocodone. Never accept or take cough medicine containing these opioids if you are already taking an opioid* medication. The combination may cause respiratory failure and death. Medication Agreement: You are responsible for carefully reading and following our Medication Agreement. This must be signed before receiving any prescriptions from our practice. Safely store a copy of your signed Agreement. Violations to the Agreement will result in no further prescriptions. (Additional copies of our Medication Agreement are available upon request.) Laws, Rules, & Regulations: All patients are expected to follow all [address] and [employer], Rules, [place] [employer].  Ignorance of the Laws does not constitute a valid excuse.  Illegal drugs and Controlled Substances: The use of illegal substances (including, but not limited to marijuana and its derivatives) and/or the illegal use of any controlled substances is strictly prohibited. Violation of this rule may result in the immediate and permanent discontinuation of any and all prescriptions being written by our practice. The use of any illegal substances is prohibited. Adopted CDC guidelines & recommendations: Target dosing levels will be at or below 60 MME/day. Use of benzodiazepines** is not recommended. Urine Drug testing: Patients taking controlled substances will be required to provide a urine sample upon request. Do not void before coming to your medication management appointments. Hold emptying your bladder until you are admitted. The admitting nurse will inform you if a sample is required. Our practice reserves the right to call you at any time to provide a sample. Once receiving the call, you have 24 hours to comply with request. Not providing a sample upon request may result in termination of medication therapy.  Exceptions: There are only two exceptions to the rule of not receiving pain medications from other Healthcare Providers. Exception #1 (Emergencies): In the event of an emergency (i.e.: accident requiring emergency care), you are allowed to receive additional pain medication. However, you are responsible for: As soon as you are able, call our office [phone], at any time of the day or night, and leave a message stating your name, the date and  nature of the emergency, and the name and dose of the medication prescribed. In the event that your call is answered by a member of our staff, make sure to document and save the date, time, and the name of the person that took your information.  Exception #2 (Planned Surgery): In the event that you are scheduled by another doctor or dentist to have any type of  surgery or procedure, you are allowed (for a period no longer than 30 days), to receive additional pain medication, for the acute post-op pain. However, in this case, you are responsible for picking up a copy of our "Post-op Pain Management for Surgeons" handout, and giving it to your surgeon or dentist. This document is available at our office, and does not require an appointment to obtain it. Simply go to our office during business hours (Monday-Thursday from 8:00 AM to 4:00 PM) (Friday 8:00 AM to 12:00 Noon) or if you have a scheduled appointment with [place], prior to your surgery, and ask for it by name. In addition, you are responsible for: calling our office [phone], at any time of the day or night, and leaving a message stating your name, name of your surgeon, type of surgery, and date of procedure or surgery. Failure to comply with your responsibilities may result in termination of therapy involving the controlled substances.  Consequences:  Non-compliance with the above rules may result in permanent discontinuation of medication prescription therapy. All patients receiving any type of controlled substance is expected to comply with the above patient responsibilities. Not doing so may result in permanent discontinuation of medication prescription therapy. Medication Agreement Violation. Following the above rules, including your responsibilities will help you in avoiding a Medication Agreement Violation ("Breaking your Pain Medication Contract").  *Opioid medications include: morphine, codeine, oxycodone, oxymorphone, hydrocodone, hydromorphone, meperidine, tramadol, tapentadol, buprenorphine, fentanyl, methadone. **Benzodiazepine medications include: diazepam (Valium), alprazolam (Xanax), clonazepam (Klonopine), lorazepam (Ativan), clorazepate (Tranxene), chlordiazepoxide (Librium), estazolam (Prosom), oxazepam (Serax), temazepam (Restoril), triazolam (Halcion) (Last updated:  08/20/2023) ______________________________________________________________________      ______________________________________________________________________    Medication Recommendations and Reminders  Applies to: All patients receiving prescriptions (written and/or electronic).  Medication Rules & Regulations: You are responsible for reading, knowing, and following our "Medication Rules" document. These exist for your safety and that of others. They are not flexible and neither are we. Dismissing or ignoring them is an act of "non-compliance" that may result in complete and irreversible termination of such medication therapy. For safety reasons, "non-compliance" will not be tolerated. As with the [place] fundamental legal principle of "ignorance of the law is no defense", we will accept no excuses for not having read and knowing the content of documents provided to you by our practice.  Pharmacy of record:  Definition: This is the pharmacy where your electronic prescriptions will be sent.  We do not endorse any particular pharmacy. It is up to you and your insurance to decide what pharmacy to use.  We do not restrict you in your choice of pharmacy. However, once we write for your prescriptions, we will NOT be re-sending more prescriptions to fix restricted supply problems created by your pharmacy, or your insurance.  The pharmacy listed in the electronic medical record should be the one where you want electronic prescriptions to be sent. If you choose to change pharmacy, simply notify our nursing staff. Changes will be made only during your regular appointments and not over the phone.  Recommendations: Keep all of your pain medications in a  safe place, under lock and key, even if you live alone. We will NOT replace lost, stolen, or damaged medication. We do not accept "Police Reports" as proof of medications having been stolen. After you fill your prescription, take 1 week's worth of pills and  put them away in a safe place. You should keep a separate, properly labeled bottle for this purpose. The remainder should be kept in the original bottle. Use this as your primary supply, until it runs out. Once it's gone, then you know that you have 1 week's worth of medicine, and it is time to come in for a prescription refill. If you do this correctly, it is unlikely that you will ever run out of medicine. To make sure that the above recommendation works, it is very important that you make sure your medication refill appointments are scheduled at least 1 week before you run out of medicine. To do this in an effective manner, make sure that you do not leave the office without scheduling your next medication management appointment. Always ask the nursing staff to show you in your prescription , when your medication will be running out. Then arrange for the receptionist to get you a return appointment, at least 7 days before you run out of medicine. Do not wait until you have 1 or 2 pills left, to come in. This is very poor planning and does not take into consideration that we may need to cancel appointments due to bad weather, sickness, or emergencies affecting our staff. DO NOT ACCEPT A "Partial Fill": If for any reason your pharmacy does not have enough pills/tablets to completely fill or refill your prescription, do not allow for a "partial fill". The law allows the pharmacy to complete that prescription within 72 hours, without requiring a new prescription. If they do not fill the rest of your prescription within those 72 hours, you will need a separate prescription to fill the remaining amount, which we will NOT provide. If the reason for the partial fill is your insurance, you will need to talk to the pharmacist about payment alternatives for the remaining tablets, but again, DO NOT ACCEPT A PARTIAL FILL, unless you can trust your pharmacist to obtain the remainder of the pills within 72  hours.  Prescription refills and/or changes in medication(s):  Prescription refills, and/or changes in dose or medication, will be conducted only during scheduled medication management appointments. (Applies to both, written and electronic prescriptions.) No refills on procedure days. No medication will be changed or started on procedure days. No changes, adjustments, and/or refills will be conducted on a procedure day. Doing so will interfere with the diagnostic portion of the procedure. No phone refills. No medications will be "called into the pharmacy". No Fax refills. No weekend refills. No Holliday refills. No after hours refills.  Remember:  Business hours are:  Monday to Thursday 8:00 AM to 4:00 PM Provider's Schedule: Delano Metz, MD - Appointments are:  Medication management: Monday and Wednesday 8:00 AM to 4:00 PM Procedure day: Tuesday and Thursday 7:30 AM to 4:00 PM Edward Jolly, MD - Appointments are:  Medication management: Tuesday and Thursday 8:00 AM to 4:00 PM Procedure day: Monday and Wednesday 7:30 AM to 4:00 PM (Last update: 08/20/2022) ______________________________________________________________________      ______________________________________________________________________     Naloxone Nasal Spray  Why am I receiving this medication? [place] STOP ACT requires that all patients taking high dose opioids or at risk of opioids respiratory depression, be prescribed an  opioid reversal agent, such as Naloxone (AKA: Narcan).  What is this medication? NALOXONE (nal OX one) treats opioid overdose, which causes slow or shallow breathing, severe drowsiness, or trouble staying awake. Call emergency services after using this medication. You may need additional treatment. Naloxone works by reversing the effects of opioids. It belongs to a group of medications called opioid blockers.  COMMON BRAND NAME(S): Kloxxado, Narcan  What should I tell my care  team before I take this medication? They need to know if you have any of these conditions: Heart disease Substance use disorder An unusual or allergic reaction to naloxone, other medications, foods, dyes, or preservatives Pregnant or trying to get pregnant Breast-feeding  When to use this medication? This medication is to be used for the treatment of respiratory depression (less than 8 breaths per minute) secondary to opioid overdose.   How to use this medication? This medication is for use in the nose. Lay the person on their back. Support their neck with your hand and allow the head to tilt back before giving the medication. The nasal spray should be given into 1 nostril. After giving the medication, move the person onto their side. Do not remove or test the nasal spray until ready to use. Get emergency medical help right away after giving the first dose of this medication, even if the person wakes up. You should be familiar with how to recognize the signs and symptoms of a narcotic overdose. If more doses are needed, give the additional dose in the other nostril. Talk to your care team about the use of this medication in children. While this medication may be prescribed for children as young as newborns for selected conditions, precautions do apply.  Naloxone Overdosage: If you think you have taken too much of this medicine contact a poison control center or emergency room at once.  NOTE: This medicine is only for you. Do not share this medicine with others.  What if I miss a dose? This does not apply.  What may interact with this medication? This is only used during an emergency. No interactions are expected during emergency use. This list may not describe all possible interactions. Give your health care provider a list of all the medicines, herbs, non-prescription drugs, or dietary supplements you use. Also tell them if you smoke, drink alcohol, or use illegal drugs. Some items may  interact with your medicine.  What should I watch for while using this medication? Keep this medication ready for use in the case of an opioid overdose. Make sure that you have the phone number of your care team and local hospital ready. You may need to have additional doses of this medication. Each nasal spray contains a single dose. Some emergencies may require additional doses. After use, bring the treated person to the nearest hospital or call 911. Make sure the treating care team knows that the person has received a dose of this medication. You will receive additional instructions on what to do during and after use of this medication before an emergency occurs.  What side effects may I notice from receiving this medication? Side effects that you should report to your care team as soon as possible: Allergic reactions--skin rash, itching, hives, swelling of the face, lips, tongue, or throat Side effects that usually do not require medical attention (report these to your care team if they continue or are bothersome): Constipation Dryness or irritation inside the nose Headache Increase in blood pressure Muscle spasms Stuffy nose  Toothache This list may not describe all possible side effects. Call your doctor for medical advice about side effects. You may report side effects to FDA at 1-800-FDA-1088.  Where should I keep my medication? Because this is an emergency medication, you should keep it with you at all times.  Keep out of the reach of children and pets. Store between 20 and 25 degrees C (68 and 77 degrees F). Do not freeze. Throw away any unused medication after the expiration date. Keep in original box until ready to use.  NOTE: This sheet is a summary. It may not cover all possible information. If you have questions about this medicine, talk to your doctor, pharmacist, or health care provider.   2023 Elsevier/Gold Standard (2021-06-22  00:00:00)  ______________________________________________________________________

## 2023-11-17 NOTE — Progress Notes (Signed)
Nursing Pain Medication Assessment:  Safety precautions to be maintained throughout the outpatient stay will include: orient to surroundings, keep bed in low position, maintain call bell within reach at all times, provide assistance with transfer out of bed and ambulation.  Medication Inspection Compliance: Karen Edwards did not comply with our request to bring her pills to be counted. She was reminded that bringing the medication bottles, even when empty, is a requirement.  Medication: None brought in. Pill/Patch Count: None available to be counted. Bottle Appearance: No container available. Did not bring bottle(s) to appointment. Filled Date: N/A Last Medication intake:  Yesterday

## 2023-12-02 ENCOUNTER — Ambulatory Visit
Admission: RE | Admit: 2023-12-02 | Discharge: 2023-12-02 | Disposition: A | Discharge status: Home / Self Care | Payer: Medicare PPO | Source: Ambulatory Visit | Attending: Pain Medicine | Admitting: Pain Medicine

## 2023-12-02 ENCOUNTER — Ambulatory Visit: Payer: Medicare PPO | Attending: Pain Medicine | Admitting: Pain Medicine

## 2023-12-02 VITALS — BP 125/67 | HR 80 | Temp 97.8°F | Resp 18 | Ht 60.0 in | Wt 161.0 lb

## 2023-12-02 DIAGNOSIS — M4696 Unspecified inflammatory spondylopathy, lumbar region: Secondary | ICD-10-CM | POA: Diagnosis not present

## 2023-12-02 DIAGNOSIS — R937 Abnormal findings on diagnostic imaging of other parts of musculoskeletal system: Secondary | ICD-10-CM | POA: Insufficient documentation

## 2023-12-02 DIAGNOSIS — M545 Low back pain, unspecified: Secondary | ICD-10-CM | POA: Diagnosis not present

## 2023-12-02 DIAGNOSIS — M47816 Spondylosis without myelopathy or radiculopathy, lumbar region: Secondary | ICD-10-CM | POA: Insufficient documentation

## 2023-12-02 DIAGNOSIS — G8929 Other chronic pain: Secondary | ICD-10-CM | POA: Diagnosis present

## 2023-12-02 DIAGNOSIS — Z5189 Encounter for other specified aftercare: Secondary | ICD-10-CM | POA: Insufficient documentation

## 2023-12-02 DIAGNOSIS — Z9682 Presence of neurostimulator: Secondary | ICD-10-CM | POA: Diagnosis present

## 2023-12-02 DIAGNOSIS — M47817 Spondylosis without myelopathy or radiculopathy, lumbosacral region: Secondary | ICD-10-CM | POA: Insufficient documentation

## 2023-12-02 DIAGNOSIS — M5459 Other low back pain: Secondary | ICD-10-CM | POA: Diagnosis present

## 2023-12-02 MED ORDER — FENTANYL CITRATE (PF) 100 MCG/2ML IJ SOLN
INTRAMUSCULAR | Status: AC
  Filled 2023-12-02: qty 2

## 2023-12-02 MED ORDER — MIDAZOLAM HCL 5 MG/5ML IJ SOLN
INTRAMUSCULAR | Status: AC
Start: 2023-12-02 — End: ?
  Filled 2023-12-02: qty 5

## 2023-12-02 MED ORDER — FENTANYL CITRATE (PF) 100 MCG/2ML IJ SOLN
25.0000 ug | INTRAMUSCULAR | Status: DC | PRN
  Administered 2023-12-02: 50 ug via INTRAVENOUS

## 2023-12-02 MED ORDER — ROPIVACAINE HCL 2 MG/ML IJ SOLN
INTRAMUSCULAR | Status: AC
  Filled 2023-12-02: qty 20

## 2023-12-02 MED ORDER — TRIAMCINOLONE ACETONIDE 40 MG/ML IJ SUSP
INTRAMUSCULAR | Status: AC
  Filled 2023-12-02: qty 2

## 2023-12-02 MED ORDER — PENTAFLUOROPROP-TETRAFLUOROETH EX AERO
INHALATION_SPRAY | Freq: Once | CUTANEOUS | Status: AC
  Administered 2023-12-02: 30 via TOPICAL

## 2023-12-02 MED ORDER — MIDAZOLAM HCL 5 MG/5ML IJ SOLN
0.5000 mg | Freq: Once | INTRAMUSCULAR | Status: AC
  Administered 2023-12-02: 4 mg via INTRAVENOUS

## 2023-12-02 MED ORDER — LIDOCAINE HCL 2 % IJ SOLN
INTRAMUSCULAR | Status: AC
  Filled 2023-12-02: qty 20

## 2023-12-02 MED ORDER — ROPIVACAINE HCL 2 MG/ML IJ SOLN
18.0000 mL | Freq: Once | INTRAMUSCULAR | Status: AC
  Administered 2023-12-02: 18 mL via PERINEURAL

## 2023-12-02 MED ORDER — TRIAMCINOLONE ACETONIDE 40 MG/ML IJ SUSP
80.0000 mg | Freq: Once | INTRAMUSCULAR | Status: AC
  Administered 2023-12-02: 80 mg

## 2023-12-02 MED ORDER — LIDOCAINE HCL 2 % IJ SOLN
20.0000 mL | Freq: Once | INTRAMUSCULAR | Status: AC
Start: 2023-12-02 — End: 2023-12-02
  Administered 2023-12-02: 400 mg

## 2023-12-02 NOTE — Patient Instructions (Signed)

## 2023-12-02 NOTE — Progress Notes (Signed)
PROVIDER NOTE: Interpretation of information contained herein should be left to medically-trained personnel. Specific patient instructions are provided elsewhere under "Patient Instructions" section of medical record. This document was created in part using STT-dictation technology, any transcriptional errors that may result from this process are unintentional.  Patient: Karen Edwards Type: Established DOB: June 03, 1966 MRN: [id number] PCP: Melburn Popper, FNP  Service: Procedure DOS: 12/02/2023 Setting: Ambulatory Location: Ambulatory outpatient facility Delivery: Face-to-face Provider: Oswaldo Done, MD Specialty: Interventional Pain Management Specialty designation: 09 Location: Outpatient facility Ref. Prov.: Delano Metz, MD       Interventional Therapy   Type: Lumbar Facet, Medial Branch Block(s) (w/ fluoroscopic mapping) R5L6   Laterality: Bilateral  Level: L2, L3, L4, L5, and S1 Medial Branch Level(s). Injecting these levels blocks the L3-4, L4-5, and L5-S1 lumbar facet joints. Imaging: Fluoroscopic guidance Spinal ([id number]) Anesthesia: Local anesthesia (1-2% Lidocaine) Anxiolysis: IV Versed 4.0 mg Sedation: Moderate Sedation Fentanyl 1 mL (50 mcg) DOS: 12/02/2023 Performed by: Oswaldo Done, MD  Primary Purpose: Diagnostic/Therapeutic Indications: Low back pain severe enough to impact quality of life or function. 1. Chronic low back pain (Bilateral) w/o sciatica   2. Lumbar facet joint pain   3. Lumbar facet hypertrophy (Bilateral)   4. 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 Spondylosis without myelopathy or radiculopathy, lumbosacral region   6. Presence of neurostimulator (Thoracolumbar)   7. Lumbar spondylosis    NAS-11 Pain score:   Pre-procedure: 8 /10   Post-procedure: 0-No pain/10     Position / Prep / Materials:  Position: Prone  Prep solution: ChloraPrep (2% chlorhexidine gluconate and 70% isopropyl alcohol) Area Prepped:  Posterolateral Lumbosacral Spine (Wide prep: From the lower border of the scapula down to the end of the tailbone and from flank to flank.)  Materials:  Tray: Block Needle(s):  Type: Spinal  Gauge (G): 22  Length: 3.5-in Qty: 4     H&P (Pre-op Assessment):  Karen Edwards is a 58 y.o. (year old), female patient, seen today for interventional treatment. She  has a past surgical history that includes Cesarean section; Ankle surgery (Right, 03/02/2014); Hemorrhoid surgery (N/A, 06/16/2015); Cervical spine surgery (02/21/2016); Cesarean section with bilateral tubal ligation (1990); Colonoscopy (04/19/2015); Hysteroscopy with D & C (12/24/2013); Colonoscopy with propofol (N/A, 05/18/2018); Esophagogastroduodenoscopy (egd) with propofol (N/A, 05/18/2018); Spinal cord stimulator insertion (N/A, 06/18/2018); Tubal ligation; Lumbar spinal cord simulator revision (N/A, 03/14/2020); Colonoscopy with propofol (N/A, 06/25/2023); Colonoscopy with propofol (N/A, 08/07/2023); and polypectomy (08/07/2023). Karen Edwards has a current medication list which includes the following prescription(s): acetaminophen, albuterol, fluoxetine, fluticasone, linaclotide, montelukast, naloxone, ondansetron, ondansetron, oxycodone, promethazine-dextromethorphan, quetiapine, scopolamine, and zolpidem, and the following Facility-Administered Medications: fentanyl. Her primarily concern today is the Back Pain (lower)  Initial Vital Signs:  Pulse/HCG Rate: 80ECG Heart Rate: 83 (nsr) Temp: (!) 97.3 F (36.3 C) Resp: 16 BP: 139/86 SpO2: 98 %  BMI: Estimated body mass index is 31.44 kg/m as calculated from the following:   Height as of this encounter: 5' (1.524 m).   Weight as of this encounter: 161 lb (73 kg).  Risk Assessment: Allergies: Reviewed. She is allergic to gabapentin.  Allergy Precautions: None required Coagulopathies: Reviewed. None identified.  Blood-thinner therapy: None at this time Active Infection(s): Reviewed. None  identified. Karen Edwards is afebrile  Site Confirmation: Karen Edwards was asked to confirm the procedure and laterality before marking the site Procedure checklist: Completed Consent: Before the procedure and under the influence of no sedative(s), amnesic(s), or anxiolytics, the patient  was informed of the treatment options, risks and possible complications. To fulfill our ethical and legal obligations, as recommended by the American Medical Association's Code of Ethics, I have informed the patient of my clinical impression; the nature and purpose of the treatment or procedure; the risks, benefits, and possible complications of the intervention; the alternatives, including doing nothing; the risk(s) and benefit(s) of the alternative treatment(s) or procedure(s); and the risk(s) and benefit(s) of doing nothing. The patient was provided information about the general risks and possible complications associated with the procedure. These may include, but are not limited to: failure to achieve desired goals, infection, bleeding, organ or nerve damage, allergic reactions, paralysis, and death. In addition, the patient was informed of those risks and complications associated to Spine-related procedures, such as failure to decrease pain; infection (i.e.: Meningitis, epidural or intraspinal abscess); bleeding (i.e.: epidural hematoma, subarachnoid hemorrhage, or any other type of intraspinal or peri-dural bleeding); organ or nerve damage (i.e.: Any type of peripheral nerve, nerve root, or spinal cord injury) with subsequent damage to sensory, motor, and/or autonomic systems, resulting in permanent pain, numbness, and/or weakness of one or several areas of the body; allergic reactions; (i.e.: anaphylactic reaction); and/or death. Furthermore, the patient was informed of those risks and complications associated with the medications. These include, but are not limited to: allergic reactions (i.e.: anaphylactic or  anaphylactoid reaction(s)); adrenal axis suppression; blood sugar elevation that in diabetics may result in ketoacidosis or comma; water retention that in patients with history of congestive heart failure may result in shortness of breath, pulmonary edema, and decompensation with resultant heart failure; weight gain; swelling or edema; medication-induced neural toxicity; particulate matter embolism and blood vessel occlusion with resultant organ, and/or nervous system infarction; and/or aseptic necrosis of one or more joints. Finally, the patient was informed that Medicine is not an exact science; therefore, there is also the possibility of unforeseen or unpredictable risks and/or possible complications that may result in a catastrophic outcome. The patient indicated having understood very clearly. We have given the patient no guarantees and we have made no promises. Enough time was given to the patient to ask questions, all of which were answered to the patient's satisfaction. Ms. Gayle has indicated that she wanted to continue with the procedure. Attestation: I, the ordering provider, attest that I have discussed with the patient the benefits, risks, side-effects, alternatives, likelihood of achieving goals, and potential problems during recovery for the procedure that I have provided informed consent. Date  Time: 12/02/2023  9:05 AM  Pre-Procedure Preparation:  Monitoring: As per clinic protocol. Respiration, ETCO2, SpO2, BP, heart rate and rhythm monitor placed and checked for adequate function Safety Precautions: Patient was assessed for positional comfort and pressure points before starting the procedure. Time-out: I initiated and conducted the "Time-out" before starting the procedure, as per protocol. The patient was asked to participate by confirming the accuracy of the "Time Out" information. Verification of the correct person, site, and procedure were performed and confirmed by me, the nursing  staff, and the patient. "Time-out" conducted as per Joint Commission's Universal Protocol (UP.01.01.01). Time: 1012 Start Time: 1012 hrs.  Description of Procedure:          Laterality: (see above) Targeted Levels: (see above)  Safety Precautions: Aspiration looking for blood return was conducted prior to all injections. At no point did we inject any substances, as a needle was being advanced. Before injecting, the patient was told to immediately notify me if she was experiencing any  new onset of "ringing in the ears, or metallic taste in the mouth". No attempts were made at seeking any paresthesias. Safe injection practices and needle disposal techniques used. Medications properly checked for expiration dates. SDV (single dose vial) medications used. After the completion of the procedure, all disposable equipment used was discarded in the proper designated medical waste containers. Local Anesthesia: Protocol guidelines were followed. The patient was positioned over the fluoroscopy table. The area was prepped in the usual manner. The time-out was completed. The target area was identified using fluoroscopy. A 12-in long, straight, sterile hemostat was used with fluoroscopic guidance to locate the targets for each level blocked. Once located, the skin was marked with an approved surgical skin marker. Once all sites were marked, the skin (epidermis, dermis, and hypodermis), as well as deeper tissues (fat, connective tissue and muscle) were infiltrated with a small amount of a short-acting local anesthetic, loaded on a 10cc syringe with a 25G, 1.5-in  Needle. An appropriate amount of time was allowed for local anesthetics to take effect before proceeding to the next step. Local Anesthetic: Lidocaine 2.0% The unused portion of the local anesthetic was discarded in the proper designated containers. Technical description of process:  L2 Medial Branch Nerve Block (MBB): The target area for the L2 medial branch  is at the junction of the postero-lateral aspect of the superior articular process and the superior, posterior, and medial edge of the transverse process of L3. Under fluoroscopic guidance, a Quincke needle was inserted until contact was made with os over the superior postero-lateral aspect of the pedicular shadow (target area). After negative aspiration for blood, 0.5 mL of the nerve block solution was injected without difficulty or complication. The needle was removed intact. L3 Medial Branch Nerve Block (MBB): The target area for the L3 medial branch is at the junction of the postero-lateral aspect of the superior articular process and the superior, posterior, and medial edge of the transverse process of L4. Under fluoroscopic guidance, a Quincke needle was inserted until contact was made with os over the superior postero-lateral aspect of the pedicular shadow (target area). After negative aspiration for blood, 0.5 mL of the nerve block solution was injected without difficulty or complication. The needle was removed intact. L4 Medial Branch Nerve Block (MBB): The target area for the L4 medial branch is at the junction of the postero-lateral aspect of the superior articular process and the superior, posterior, and medial edge of the transverse process of L5. Under fluoroscopic guidance, a Quincke needle was inserted until contact was made with os over the superior postero-lateral aspect of the pedicular shadow (target area). After negative aspiration for blood, 0.5 mL of the nerve block solution was injected without difficulty or complication. The needle was removed intact. L5 Medial Branch Nerve Block (MBB): The target area for the L5 medial branch is at the junction of the postero-lateral aspect of the superior articular process and the superior, posterior, and medial edge of the sacral ala. Under fluoroscopic guidance, a Quincke needle was inserted until contact was made with os over the superior  postero-lateral aspect of the pedicular shadow (target area). After negative aspiration for blood, 0.5 mL of the nerve block solution was injected without difficulty or complication. The needle was removed intact. S1 Medial Branch Nerve Block (MBB): The target area for the S1 medial branch is at the posterior and inferior 6 o'clock position of the L5-S1 facet joint. Under fluoroscopic guidance, the Quincke needle inserted for the L5 MBB  was redirected until contact was made with os over the inferior and postero aspect of the sacrum, at the 6 o' clock position under the L5-S1 facet joint (Target area). After negative aspiration for blood, 0.5 mL of the nerve block solution was injected without difficulty or complication. The needle was removed intact.  Once the entire procedure was completed, the treated area was cleaned, making sure to leave some of the prepping solution back to take advantage of its long term bactericidal properties.         Illustration of the posterior view of the lumbar spine and the posterior neural structures. Laminae of L2 through S1 are labeled. DPRL5, dorsal primary ramus of L5; DPRS1, dorsal primary ramus of S1; DPR3, dorsal primary ramus of L3; FJ, facet (zygapophyseal) joint L3-L4; I, inferior articular process of L4; LB1, lateral branch of dorsal primary ramus of L1; IAB, inferior articular branches from L3 medial branch (supplies L4-L5 facet joint); IBP, intermediate branch plexus; MB3, medial branch of dorsal primary ramus of L3; NR3, third lumbar nerve root; S, superior articular process of L5; SAB, superior articular branches from L4 (supplies L4-5 facet joint also); TP3, transverse process of L3.   Facet Joint Innervation (* possible contribution)  L1-2 T12, L1 (L2*)  Medial Branch  L2-3 L1, L2 (L3*)         "          "  L3-4 L2, L3 (L4*)         "          "  L4-5 L3, L4 (L5*)         "          "  L5-S1 L4, L5, S1          "          "    Vitals:   12/02/23  1008 12/02/23 1013 12/02/23 1018 12/02/23 1023  BP: (!) 121/93 113/84 (!) 121/93 121/77  Pulse:      Resp: 18 17 18 17   Temp:      TempSrc:      SpO2: 99% 100% 99% 98%  Weight:      Height:         End Time: 1022 hrs.  Imaging Guidance (Spinal):          Type of Imaging Technique: Fluoroscopy Guidance (Spinal) Indication(s): Fluoroscopy guidance for needle placement to enhance accuracy in procedures requiring precise needle localization for targeted delivery of medication in or near specific anatomical locations not easily accessible without such real-time imaging assistance. Exposure Time: Please see nurses notes. Contrast: None used. Fluoroscopic Guidance: I was personally present during the use of fluoroscopy. "Tunnel Vision Technique" used to obtain the best possible view of the target area. Parallax error corrected before commencing the procedure. "Direction-depth-direction" technique used to introduce the needle under continuous pulsed fluoroscopy. Once target was reached, antero-posterior, oblique, and lateral fluoroscopic projection used confirm needle placement in all planes. Images permanently stored in EMR. Interpretation: No contrast injected. I personally interpreted the imaging intraoperatively. Adequate needle placement confirmed in multiple planes. Permanent images saved into the patient's record.  Post-operative Assessment:  Post-procedure Vital Signs:  Pulse/HCG Rate: 8073 (nsr) Temp: (!) 97.3 F (36.3 C) Resp: 17 BP: 121/77 SpO2: 98 %  EBL: None  Complications: No immediate post-treatment complications observed by team, or reported by patient.  Note: The patient tolerated the entire procedure well. A repeat set of vitals were taken after the  procedure and the patient was kept under observation following institutional policy, for this type of procedure. Post-procedural neurological assessment was performed, showing return to baseline, prior to discharge. The  patient was provided with post-procedure discharge instructions, including a section on how to identify potential problems. Should any problems arise concerning this procedure, the patient was given instructions to immediately contact us, at any time, without hesitation. In any case, we plan to contact the patient by telephone for a follow-up status report regarding this interventional procedure.  Comments:  No additional relevant information.  Plan of Care (POC)  Orders:  Orders Placed This Encounter  Procedures   LUMBAR FACET(MEDIAL BRANCH NERVE BLOCK) MBNB    Scheduling Instructions:     Procedure: Lumbar facet block (AKA.: Lumbosacral medial branch nerve block)     Side: Bilateral     Level: L3-4, L4-5, L5-S1, and TBD Facets (L2, L3, L4, L5, S1, and TBD Medial Branch Nerves)     Sedation: Patient's choice.     Timeframe: Today    Where will this procedure be performed?:   [facility]   [facility] C-ARM 1-60 MIN NO REPORT    Intraoperative interpretation by procedural physician at [facility].    Standing Status:   Standing    Number of Occurrences:   1    Reason for exam::   Assistance in needle guidance and placement for procedures requiring needle placement in or near specific anatomical locations not easily accessible without such assistance.   Informed Consent Details: Physician/Practitioner Attestation; Transcribe to consent form and obtain patient signature    Nursing Order: Transcribe to consent form and obtain patient signature. Note: Always confirm laterality of pain with Ms. Lelon Perla, before procedure.    Physician/Practitioner attestation of informed consent for procedure/surgical case:   I, the physician/practitioner, attest that I have discussed with the patient the benefits, risks, side effects, alternatives, likelihood of achieving goals and potential problems during recovery for the procedure that I have provided informed consent.    Procedure:    Lumbar Facet Block  under fluoroscopic guidance    Physician/Practitioner performing the procedure:   Pearlie Lafosse A. Laban Emperor MD    Indication/Reason:   Low Back Pain, with our without leg pain, due to Facet Joint Arthralgia (Joint Pain) Spondylosis (Arthritis of the Spine), without myelopathy or radiculopathy (Nerve Damage).   Provide equipment / supplies at bedside    Procedure tray: "Block Tray" (Disposable  single use) Skin infiltration needle: Regular 1.5-in, 25-G, (x1) Block Needle type: Spinal Amount/quantity: 4 Size: Regular (3.5-inch) Gauge: 22G    Standing Status:   Standing    Number of Occurrences:   1    Specify:   Block Tray   Saline lock IV    Have LR [phone] mL available and administer at 125 mL/hr if patient becomes hypotensive.    Standing Status:   Standing    Number of Occurrences:   1   Chronic Opioid Analgesic:  Oxycodone IR 5 mg, 1 tab p.o. twice daily PRN (#20/90 days).  MME/day: 0 mg/day.   Medications ordered for procedure: Meds ordered this encounter  Medications   lidocaine (XYLOCAINE) 2 % (with pres) injection 400 mg   pentafluoroprop-tetrafluoroeth (GEBAUERS) aerosol   midazolam (VERSED) 5 MG/5ML injection 0.5-2 mg    Make sure Flumazenil is available in the pyxis when using this medication. If oversedation occurs, administer 0.2 mg IV over 15 sec. If after 45 sec no response, administer 0.2 mg again  over 1 min; may repeat at 1 min intervals; not to exceed 4 doses (1 mg)   fentaNYL (SUBLIMAZE) injection 25-50 mcg    Make sure Narcan is available in the pyxis when using this medication. In the event of respiratory depression (RR< 8/min): Titrate NARCAN (naloxone) in increments of 0.1 to 0.2 mg IV at 2-3 minute intervals, until desired degree of reversal.   ropivacaine (PF) 2 mg/mL (0.2%) (NAROPIN) injection 18 mL   triamcinolone acetonide (KENALOG-40) injection 80 mg   Medications administered: We administered lidocaine, pentafluoroprop-tetrafluoroeth,  midazolam, fentaNYL, ropivacaine (PF) 2 mg/mL (0.2%), and triamcinolone acetonide.  See the medical record for exact dosing, route, and time of administration.  Follow-up plan:   Return in about 2 weeks (around 12/16/2023) for (VV), (PPE).       Interventional Therapies  Risk Factors  Considerations:   Mitral valve prolapse (MVP) GERD  urinary stress incontinence  GAD   Planned  Pending:   Diagnostic/therapeutic bilateral lumbar facet MBB R5L6    Under consideration:   Therapeutic right L1-2 & L3-4 percutaneous discectomy ([employer] compressor)  Therapeutic intrathecal pump trial    Completed:   Therapeutic right cervical ESI x2 (12/17/2022) (100/100/90/100)  Diagnostic bilateral GONB x1 (08/19/2019) (100/100/100/,50)  Palliative right cervical facet MBB x3 (07/23/2018) (100/100/100/>50)  Palliative left cervical facet MBB x4 (04/01/2019) (90/90/0/0)  Palliative right cervical facet RFA x1 (09/17/2018) (100/100/85/85)  Palliative left cervical facet RFA x1 (05/04/2019) (100/100/50/50)  Therapeutic left L1-2 LESI x1 (05/10/2021) (100/100/100 x 2 days/0)  Therapeutic right L1-2 LESI x2 (02/10/2018) (100/100/70/50-75)  Therapeutic right L1 TFESI x1 (02/10/2018) (100/100/70/50-75)  Diagnostic/therapeutic left superior cluneal NB (L2, L3 dorsal rami) x1 (04/24/2021) (0/0/0/0)  Diagnostic/therapeutic left L1 TFESI x2 (11/15/2021) (100/100/0)  Diagnostic/therapeutic left L3 TFESI x2 (11/15/2021) (100/100/0)  Bilateral lumbar spinal cord stimulator trial (done) (05/14/2018) (by me)  Bilateral lumbar spinal cord stimulator implant (09/28/2019) (by me)  Bilateral spinal cord stimulator revision (03/14/2020) (by me)  Diagnostic right lumbar facet MBB x4 (08/28/2023) (100/100/20/20) (did not keep follow-up appointment) Diagnostic left lumbar facet MBB x5 (08/28/2023) (100/100/20/20) (did not keep follow-up appointment) Diagnostic bilateral SI joint Blk x2 (09/18/2021) (100/100/100)  Palliative left  lumbar facet RFA x3 (06/27/2022) (100/100/100/100)  Therapeutic right lumbar facet RFA x3 (05/21/2022) (100/100/75/75)    Therapeutic  Palliative (PRN) options:   Palliative right cervical facet RFA   Diagnostic bilateral SI joint Blk   Therapeutic bilateral lumbar facet RFA    Pharmacotherapy  Nonopioids transfer 08/23/2020: Lidocaine 5% ointment and Robaxin      Recent Visits Date Type Provider Dept  11/17/23 Office Visit Delano Metz, MD [facility]  Showing recent visits within past 90 days and meeting all other requirements Today's Visits Date Type Provider Dept  12/02/23 Procedure visit Delano Metz, MD [facility]  Showing today's visits and meeting all other requirements Future Appointments Date Type Provider Dept  12/16/23 Appointment Delano Metz, MD [facility]  Showing future appointments within next 90 days and meeting all other requirements  Disposition: Discharge home  Discharge (Date  Time): 12/02/2023;   hrs.   Primary Care Physician: Melburn Popper, FNP Location: [facility] Note by: Oswaldo Done, MD (TTS technology used. I apologize for any typographical errors that were not detected and corrected.) Date: 12/02/2023; Time: 10:26 AM  Disclaimer:  Medicine is not an Visual merchandiser. The only guarantee in medicine is that nothing is guaranteed. It is important to note that the decision to proceed  with this intervention was based on the information collected from the patient. The Data and conclusions were drawn from the patient's questionnaire, the interview, and the physical examination. Because the information was provided in large part by the patient, it cannot be guaranteed that it has not been purposely or unconsciously manipulated. Every effort has been made to obtain as much relevant data as possible for this evaluation. It is important to note that the conclusions that lead to  this procedure are derived in large part from the available data. Always take into account that the treatment will also be dependent on availability of resources and existing treatment guidelines, considered by other Pain Management Practitioners as being common knowledge and practice, at the time of the intervention. For Medico-Legal purposes, it is also important to point out that variation in procedural techniques and pharmacological choices are the acceptable norm. The indications, contraindications, technique, and results of the above procedure should only be interpreted and judged by a Board-Certified Interventional Pain Specialist with extensive familiarity and expertise in the same exact procedure and technique.

## 2023-12-03 ENCOUNTER — Telehealth: Payer: Self-pay

## 2023-12-03 NOTE — Telephone Encounter (Signed)
 Post procedure follow up.  LM

## 2023-12-15 NOTE — Progress Notes (Unsigned)
 Patient: Karen Edwards  Service Category: E/M  Provider: Oswaldo Done, MD  DOB: August 18, 1966  DOS: 12/16/2023  Location: Office  MRN: [id number]  Setting: Ambulatory outpatient  Referring Provider: Melburn Popper, FNP  Type: Established Patient  Specialty: Interventional Pain Management  PCP: Melburn Popper, FNP  Location: Remote location  Delivery: TeleHealth     Virtual Encounter - Pain Management PROVIDER NOTE: Information contained herein reflects review and annotations entered in association with encounter. Interpretation of such information and data should be left to medically-trained personnel. Information provided to patient can be located elsewhere in the medical record under "Patient Instructions". Document created using STT-dictation technology, any transcriptional errors that may result from process are unintentional.    Contact & Pharmacy Preferred: [phone] Home: [phone] (home) Mobile: [phone] (mobile) E-mail: [email or web address]  [id number] - [place], [place] - [address] [place] [address] Phone: [phone] Fax: [phone]   Pre-screening  Ms. Tonkinson offered "in-person" vs "virtual" encounter. She indicated preferring virtual for this encounter.   Reason COVID-19*  Social distancing based on CDC and AMA recommendations.   I contacted Karen Edwards on 12/16/2023 via telephone.      I clearly identified myself as Oswaldo Done, MD. I verified that I was speaking with the correct person using two identifiers (Name: Karen Edwards, and date of birth: Jan 15, 1966).  Consent I sought verbal advanced consent from Karen Edwards for virtual visit interactions. I informed Ms. Czarnecki of possible security and privacy concerns, risks, and limitations associated with providing "not-in-person" medical evaluation and management services. I also informed Ms. Malina of the availability of "in-person"  appointments. Finally, I informed her that there would be a charge for the virtual visit and that she could be  personally, fully or partially, financially responsible for it. Ms. Bolinger expressed understanding and agreed to proceed.   Historic Elements   Karen Edwards is a 58 y.o. year old, female patient evaluated today after our last contact on 12/02/2023. Karen Edwards  has a past medical history of Anxiety, Chronic back pain, Degenerative joint disease, Depression, Headache, Hypotension, IBS (irritable bowel syndrome) (05/2015), MVP (mitral valve prolapse), and Nausea and vomiting. She also  has a past surgical history that includes Cesarean section; Ankle surgery (Right, 03/02/2014); Hemorrhoid surgery (N/A, 06/16/2015); Cervical spine surgery (02/21/2016); Cesarean section with bilateral tubal ligation (1990); Colonoscopy (04/19/2015); Hysteroscopy with D & C (12/24/2013); Colonoscopy with propofol (N/A, 05/18/2018); Esophagogastroduodenoscopy (egd) with propofol (N/A, 05/18/2018); Spinal cord stimulator insertion (N/A, 06/18/2018); Tubal ligation; Lumbar spinal cord simulator revision (N/A, 03/14/2020); Colonoscopy with propofol (N/A, 06/25/2023); Colonoscopy with propofol (N/A, 08/07/2023); and polypectomy (08/07/2023). [name] has a current medication list which includes the following prescription(s): acetaminophen, albuterol, fluoxetine, fluticasone, linaclotide, montelukast, naloxone, ondansetron, ondansetron, oxycodone, promethazine-dextromethorphan, quetiapine, scopolamine, and zolpidem. She  reports that she has been smoking cigarettes. She has a 18.5 pack-year smoking history. She has never used smokeless tobacco. She reports current alcohol use of about 1.0 standard drink of alcohol per week. She reports that she does not use drugs. Karen Edwards is allergic to gabapentin.  BMI: Estimated body mass index is 31.44 kg/m as calculated from the following:   Height as of 12/02/23: 5' (1.524 m).    Weight as of 12/02/23: 161 lb (73 kg). Last encounter: 11/17/2023. Last procedure: 12/02/2023.  HPI  Today, she is being contacted for a post-procedure assessment. Discussed the use of AI scribe software for clinical  note transcription with the patient, who gave verbal consent to proceed.  History of Present Illness   Karen Edwards is a 58 year old female who presents for follow-up after a bilateral lumbar facet block procedure.  She underwent a bilateral lumbar facet block procedure on December 02, 2023. Since the procedure, she has experienced an 80% improvement in her symptoms, noting significant relief while the area was numb. This relief is greater than what she experienced with previous procedures.  She mentions that her neck is bothering her slightly. She plans to wait and see if it improves before considering further intervention.      Post-procedure evaluation   Type: Lumbar Facet, Medial Branch Block(s) (w/ fluoroscopic mapping) R5L6   Laterality: Bilateral  Level: L2, L3, L4, L5, and S1 Medial Branch Level(s). Injecting these levels blocks the L3-4, L4-5, and L5-S1 lumbar facet joints. Imaging: Fluoroscopic guidance Spinal ([id number]) Anesthesia: Local anesthesia (1-2% Lidocaine) Anxiolysis: IV Versed 4.0 mg Sedation: Moderate Sedation Fentanyl 1 mL (50 mcg) DOS: 12/02/2023 Performed by: Oswaldo Done, MD  Primary Purpose: Diagnostic/Therapeutic Indications: Low back pain severe enough to impact quality of life or function. 1. Chronic low back pain (Bilateral) w/o sciatica   2. Lumbar facet joint pain   3. Lumbar facet hypertrophy (Bilateral)   4. 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 Spondylosis without myelopathy or radiculopathy, lumbosacral region   6. Presence of neurostimulator (Thoracolumbar)   7. Lumbar spondylosis    NAS-11 Pain score:   Pre-procedure: 8 /10   Post-procedure: 0-No pain/10     Effectiveness:  Initial hour after procedure: 100  %. Subsequent 4-6 hours post-procedure: 100 %. Analgesia past initial 6 hours: 80 % (ongoing). Ongoing improvement:  Analgesic: The patient indicates having an ongoing 80% improvement of her low back pain. Function: Ms. Lamarche reports improvement in function ROM: Ms. Stream reports improvement in ROM  Pharmacotherapy Assessment  Opioid Analgesic: Oxycodone IR 5 mg, 1 tab p.o. twice daily PRN (#20/90 days).  MME/day: 0 mg/day.   Monitoring: [place] PMP: PDMP reviewed during this encounter.       Pharmacotherapy: No side-effects or adverse reactions reported. Compliance: No problems identified. Effectiveness: Clinically acceptable. Plan: Refer to "POC". UDS:  Summary  Date Value Ref Range Status  07/30/2023 Note  Final    Comment:    ==================================================================== [employer] Select 13 (MW) ==================================================================== Test                             Result       Flag       Units  Drug Present and Declared for Prescription Verification   Noroxycodone                   193          EXPECTED   ng/mg creat    Noroxycodone is an expected metabolite of oxycodone. Sources of    oxycodone include scheduled prescription medications.  Drug Absent but Declared for Prescription Verification   Oxycodone                      Not Detected UNEXPECTED ng/mg creat    Oxycodone is almost always present in patients taking this drug    consistently.  Absence of oxycodone could be due to lapse of time    since the last dose or unusual pharmacokinetics (rapid metabolism).  ==================================================================== Test  Result    Flag   Units      Ref Range   Creatinine              80               mg/dL      >=09 ==================================================================== Declared Medications:  The flagging and interpretation on this report are based on the   following declared medications.  Unexpected results may arise from  inaccuracies in the declared medications.   **Note: The testing scope of this panel includes these medications:   Oxycodone (Roxicodone)   **Note: The testing scope of this panel does not include the  following reported medications:   Acetaminophen (Tylenol)  Fluoxetine (Prozac)  Linaclotide (Linzess)  Naloxone (Narcan)  Ondansetron (Zofran)  Quetiapine (Seroquel)  Scopolamine ==================================================================== For clinical consultation, please call [phone]. ====================================================================    No results found for: "CBDTHCR", "D8THCCBX", "D9THCCBX"   Laboratory Chemistry Profile   Renal Lab Results  Component Value Date   BUN 12 02/01/2021   CREATININE 0.85 02/01/2021   BCR 8 (L) 09/08/2017   GFR 77.34 02/01/2021   GFRAA 93 09/08/2017   GFRNONAA 81 09/08/2017    Hepatic Lab Results  Component Value Date   AST 15 02/01/2021   ALT 11 02/01/2021   ALBUMIN 4.4 02/01/2021   ALKPHOS 59 02/01/2021    Electrolytes Lab Results  Component Value Date   NA 139 02/01/2021   K 4.2 02/01/2021   CL 105 02/01/2021   CALCIUM 9.3 02/01/2021   MG 2.1 09/08/2017    Bone Lab Results  Component Value Date   VD25OH 43.74 01/10/2016   25OHVITD1 51 09/08/2017   25OHVITD2 <1.0 09/08/2017   25OHVITD3 51 09/08/2017    Inflammation (CRP: Acute Phase) (ESR: Chronic Phase) Lab Results  Component Value Date   CRP 1.4 09/08/2017   ESRSEDRATE 11 09/08/2017         Note: Above Lab results reviewed.  Imaging  DG PAIN CLINIC C-ARM 1-60 MIN NO REPORT Fluoro was used, but no Radiologist interpretation will be provided.  Please refer to "NOTES" tab for provider progress note.  Assessment  The primary encounter diagnosis was Chronic low back pain (Bilateral) w/o sciatica. Diagnoses of Lumbar facet syndrome (Bilateral) (L>R), Lumbar facet  hypertrophy (Bilateral), and Postop check were also pertinent to this visit.  Plan of Care  Assessment and Plan    Chronic Lumbar Facet Joint Pain Reports 80% improvement in pain following bilateral lumbar facet block on December 02, 2023. Relief exceeds previous procedures, indicating a positive response to treatment. Continue current management. Advise to call if further intervention is needed.  Neck Pain Reports mild neck pain but prefers to monitor condition before considering another procedure. Monitor symptoms. Consider scheduling a procedure if pain persists or worsens.       Problem-specific:  No problem-specific Assessment & Plan notes found for this encounter.  Ms. TORA PRUNTY has a current medication list which includes the following long-term medication(s): albuterol, fluoxetine, fluticasone, linaclotide, montelukast, oxycodone, and quetiapine.  Pharmacotherapy (Medications Ordered): No orders of the defined types were placed in this encounter.  Orders:  Orders Placed This Encounter  Procedures   Nursing Instructions:    Please complete this patient's postprocedure evaluation.    Scheduling Instructions:     Please complete this patient's postprocedure evaluation.   Follow-up plan:   Return if symptoms worsen or fail to improve.      Interventional Therapies  Risk Factors  Considerations:   Mitral valve prolapse (MVP) GERD  urinary stress incontinence  GAD   Planned  Pending:   Diagnostic/therapeutic bilateral lumbar facet MBB R5L6    Under consideration:   Therapeutic right L1-2 & L3-4 percutaneous discectomy ([employer] compressor)  Therapeutic intrathecal pump trial    Completed:   Therapeutic right cervical ESI x2 (12/17/2022) (100/100/90/100)  Diagnostic bilateral GONB x1 (08/19/2019) (100/100/100/,50)  Palliative right cervical facet MBB x3 (07/23/2018) (100/100/100/>50)  Palliative left cervical facet MBB x4 (04/01/2019) (90/90/0/0)  Palliative  right cervical facet RFA x1 (09/17/2018) (100/100/85/85)  Palliative left cervical facet RFA x1 (05/04/2019) (100/100/50/50)  Therapeutic left L1-2 LESI x1 (05/10/2021) (100/100/100 x 2 days/0)  Therapeutic right L1-2 LESI x2 (02/10/2018) (100/100/70/50-75)  Therapeutic right L1 TFESI x1 (02/10/2018) (100/100/70/50-75)  Diagnostic/therapeutic left superior cluneal NB (L2, L3 dorsal rami) x1 (04/24/2021) (0/0/0/0)  Diagnostic/therapeutic left L1 TFESI x2 (11/15/2021) (100/100/0)  Diagnostic/therapeutic left L3 TFESI x2 (11/15/2021) (100/100/0)  Bilateral lumbar spinal cord stimulator trial (done) (05/14/2018) (by me)  Bilateral lumbar spinal cord stimulator implant (09/28/2019) (by me)  Bilateral spinal cord stimulator revision (03/14/2020) (by me)  Diagnostic right lumbar facet MBB x5 (12/02/2023) (100/100/80/80)  Diagnostic left lumbar facet MBB x6 (12/02/2023) (100/100/80/80)  Diagnostic bilateral SI joint Blk x2 (09/18/2021) (100/100/100)  Palliative left lumbar facet RFA x3 (06/27/2022) (100/100/100/100)  Therapeutic right lumbar facet RFA x3 (05/21/2022) (100/100/75/75)    Therapeutic  Palliative (PRN) options:   Palliative right cervical facet RFA   Diagnostic bilateral SI joint Blk   Therapeutic bilateral lumbar facet RFA    Pharmacotherapy  Nonopioids transfer 08/23/2020: Lidocaine 5% ointment and Robaxin      Recent Visits Date Type Provider Dept  12/02/23 Procedure visit Delano Metz, MD [facility]  11/17/23 Office Visit Delano Metz, MD [facility]  Showing recent visits within past 90 days and meeting all other requirements Today's Visits Date Type Provider Dept  12/16/23 Office Visit Delano Metz, MD [facility]  Showing today's visits and meeting all other requirements Future Appointments No visits were found meeting these conditions. Showing future appointments within next 90 days and meeting all other requirements  I  discussed the assessment and treatment plan with the patient. The patient was provided an opportunity to ask questions and all were answered. The patient agreed with the plan and demonstrated an understanding of the instructions.  Patient advised to call back or seek an in-person evaluation if the symptoms or condition worsens.  Duration of encounter: 12 minutes.  Note by: Oswaldo Done, MD Date: 12/16/2023; Time: 3:35 PM

## 2023-12-16 ENCOUNTER — Ambulatory Visit: Payer: Medicare PPO | Attending: Pain Medicine | Admitting: Pain Medicine

## 2023-12-16 DIAGNOSIS — G8929 Other chronic pain: Secondary | ICD-10-CM

## 2023-12-16 DIAGNOSIS — Z09 Encounter for follow-up examination after completed treatment for conditions other than malignant neoplasm: Secondary | ICD-10-CM

## 2023-12-16 DIAGNOSIS — M545 Low back pain, unspecified: Secondary | ICD-10-CM | POA: Diagnosis not present

## 2023-12-16 DIAGNOSIS — M47816 Spondylosis without myelopathy or radiculopathy, lumbar region: Secondary | ICD-10-CM | POA: Diagnosis not present

## 2024-02-01 NOTE — Progress Notes (Unsigned)
 PROVIDER NOTE: Interpretation of information contained herein should be left to medically-trained personnel. Specific patient instructions are provided elsewhere under "Patient Instructions" section of medical record. This document was created in part using AI and STT-dictation technology, any transcriptional errors that may result from this process are unintentional.  Patient: Karen Edwards  Service: E/M   PCP: Melburn Popper, FNP  DOB: [birth year]  DOS: 02/02/2024  Provider: Oswaldo Done, MD  MRN: [id number]  Delivery: Face-to-face  Specialty: Interventional Pain Management  Type: Established Patient  Setting: Ambulatory outpatient facility  Specialty designation: 09  Referring Prov.: Melburn Popper, FNP  Location: Outpatient office facility       HPI  Karen Edwards, a 58 y.o. year old female, is here today because of her Mid-back pain, acute [M54.9]. [name] primary complain today is Back Pain  Pertinent problems: Ms. Bothun has Chronic low back pain (1ry area of Pain) (Bilateral) (R>L) w/ sciatica (Bilateral); DDD (degenerative disc disease), lumbar; DDD (degenerative disc disease), cervical; Chronic occipital neuralgia (5th area of Pain) (Bilateral) (R>L); Lumbar facet syndrome (Bilateral) (L>R); DDD (degenerative disc disease), lumbosacral; Lumbar radicular pain (Right) (L4); Sacroiliac joint dysfunction (Bilateral); Chronic neck pain (4th area of Pain) (Bilateral) (R>L); Numbness and tingling of upper extremity (Right); Chronic upper extremity pain (Bilateral) (R>L); Chronic sacroiliac joint pain (Bilateral) (L>R); Lumbar facet hypertrophy (Bilateral); L1-2 disc extrusion (Right); Cervical foraminal stenosis (C5-6) (Bilateral); Chronic pain syndrome; Chronic cervical radicular pain (Right: C6/C7) (Left: C5/T1); Chronic lumbar radicular pain; Chronic hip pain (3ry area of Pain) (Bilateral) (R>L); Chronic lower extremity pain (2ry area of Pain) (Bilateral) (R>L);  Chronic shoulder pain (Bilateral) (L>R); Chronic musculoskeletal pain; Lumbar spondylosis; Cervical facet syndrome (Bilateral) (R>L); Myofascial pain; Spondylosis without myelopathy or radiculopathy, cervical region; Cervicalgia; S/P insertion of Epidural Neurostimulator (SCS) (Bilateral); Chronic neck pain with history of cervical spinal surgery; 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[facility]); Trigger point with back pain (Right); Cervicogenic headache (Bilateral); Occipital headache (Bilateral); Chronic tension-type headache, intractable; Cervico-occipital neuralgia (Bilateral); Pain due to any device, implant or graft (SCS battery) (Left PSIS area); Spondylosis without myelopathy or radiculopathy, lumbosacral region; History of cervical spinal surgery (C5-7 ACDF); Cervical radiculopathy at C6 (Bilateral); Chronic low back pain (Bilateral) w/o sciatica; Presence of neurostimulator (Thoracolumbar); Spinal cord stimulator dysfunction (HCC); Neurogenic pain; Chronic low back pain (Left) w/o sciatica; Cluneal neuropathy (Left); Abnormal MRI, lumbar spine (04/17/2021); Enthesopathy of hip region (Bilateral); Protrusion of lumbar intervertebral disc (Right: L1-2, L3-4); 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 Pain Assessment: Severity of Chronic pain is reported as a 8 /10. Location: Back Lower, Mid/ . Onset: More than a month ago. Quality: Aching, Sharp, Shooting, Stabbing, Throbbing. Timing: Constant. Modifying factor(s): nothing. Vitals:  height is 5' (1.524 m) and weight is 154 lb (69.9 kg). Her temperature is 98.2 F (36.8 C). Her blood pressure is 133/79 and her pulse is 73. Her oxygen saturation is 100%.  BMI: Estimated body  mass index is 30.08 kg/m as calculated from the following:   Height as of this encounter: 5' (1.524 m).   Weight as of this encounter: 154 lb (69.9 kg). Last encounter: 12/16/2023. Last procedure: 12/02/2023.  Reason for encounter: evaluation of new problem.  The patient indicates doing well with the current medication regimen. No adverse  reactions or side effects reported to the medications.  However, because the recent fall from her bed she has been experiencing more pain and has been using additional pain medication.  Discussed the use of AI scribe software for clinical note transcription with the patient, who gave verbal consent to proceed.  History of Present Illness   The patient presents with back pain after a fall from bed.  She experienced back pain immediately after falling from a queen size bed. The pain is described as throbbing and aching, primarily located in the upper back and radiating into the buttocks with certain movements. She describes a sensation of 'something's cooling' rather than sharp pain. The pain is worse in the mornings, often waking her up early. No difficulty walking, numbness, or weakness in her legs.  Her spinal cord stimulator is functioning well without any issues.  She uses medication to help with sleep, taking one pill every night, but is concerned about running out as the prescription was for 20 pills to last 90 days. She has been taking one pill per night and is now short on medication.       Pharmacotherapy Assessment  Analgesic:  Oxycodone IR 5 mg, 1 tab p.o. twice daily PRN (#20/90 days).  MME/day: 0 mg/day.   Monitoring: [place] PMP: PDMP reviewed during this encounter.       Pharmacotherapy: No side-effects or adverse reactions reported. Compliance: No problems identified. Effectiveness: Clinically acceptable.  Valerie Salts, RN  02/02/2024  1:34 PM  Sign when Signing Visit Nursing Pain Medication Assessment:  Safety precautions to be maintained  throughout the outpatient stay will include: orient to surroundings, keep bed in low position, maintain call bell within reach at all times, provide assistance with transfer out of bed and ambulation.  Medication Inspection Compliance: Pill count conducted under aseptic conditions, in front of the patient. Neither the pills nor the bottle was removed from the patient's sight at any time. Once count was completed pills were immediately returned to the patient in their original bottle.  Medication: Oxycodone IR Pill/Patch Count:  3 of 20 pills remain Pill/Patch Appearance: Markings consistent with prescribed medication Bottle Appearance: Standard pharmacy container. Clearly labeled. Filled Date: 01 / 20 / 2025 Last Medication intake:  Day before yesterday  Earlyne Iba, RN  02/02/2024  1:27 PM  Sign when Signing Visit Safety precautions to be maintained throughout the outpatient stay will include: orient to surroundings, keep bed in low position, maintain call bell within reach at all times, provide assistance with transfer out of bed and ambulation.     No results found for: "CBDTHCR" No results found for: "D8THCCBX" No results found for: "D9THCCBX"  UDS:  Summary  Date Value Ref Range Status  07/30/2023 Note  Final    Comment:    ==================================================================== [employer] [facility] (MW) ==================================================================== Test                             Result       Flag       Units  Drug Present and Declared for Prescription Verification   Noroxycodone                   193          EXPECTED   ng/mg creat    Noroxycodone is an expected metabolite of oxycodone. Sources of    oxycodone include scheduled prescription medications.  Drug Absent but Declared  for Prescription Verification   Oxycodone                      Not Detected UNEXPECTED ng/mg creat    Oxycodone is almost always present in patients taking this  drug    consistently.  Absence of oxycodone could be due to lapse of time    since the last dose or unusual pharmacokinetics (rapid metabolism).  ==================================================================== Test                      Result    Flag   Units      Ref Range   Creatinine              80               mg/dL      >=16 ==================================================================== Declared Medications:  The flagging and interpretation on this report are based on the  following declared medications.  Unexpected results may arise from  inaccuracies in the declared medications.   **Note: The testing scope of this panel includes these medications:   Oxycodone (Roxicodone)   **Note: The testing scope of this panel does not include the  following reported medications:   Acetaminophen (Tylenol)  Fluoxetine (Prozac)  Linaclotide (Linzess)  Naloxone (Narcan)  Ondansetron (Zofran)  Quetiapine (Seroquel)  Scopolamine ==================================================================== For clinical consultation, please call [phone]. ====================================================================       ROS  Constitutional: Denies any fever or chills Gastrointestinal: No reported hemesis, hematochezia, vomiting, or acute GI distress Musculoskeletal: Denies any acute onset joint swelling, redness, loss of ROM, or weakness Neurological: No reported episodes of acute onset apraxia, aphasia, dysarthria, agnosia, amnesia, paralysis, loss of coordination, or loss of consciousness  Medication Review  FLUoxetine, acetaminophen, albuterol, cyanocobalamin, fluticasone, linaclotide, montelukast, naloxone, ondansetron, oxyCODONE, predniSONE, promethazine-dextromethorphan, scopolamine, and zolpidem  History Review  Allergy: Ms. Gillooly is allergic to gabapentin. Drug: Ms. Szymanowski  reports no history of drug use. Alcohol:  reports current alcohol use of about  1.0 standard drink of alcohol per week. Tobacco:  reports that she has been smoking cigarettes. She has a 18.5 pack-year smoking history. She has never used smokeless tobacco. Social: Ms. Manrique  reports that she has been smoking cigarettes. She has a 18.5 pack-year smoking history. She has never used smokeless tobacco. She reports current alcohol use of about 1.0 standard drink of alcohol per week. She reports that she does not use drugs. Medical:  has a past medical history of Anxiety, Chronic back pain, Degenerative joint disease, Depression, Headache, Hypotension, IBS (irritable bowel syndrome) (05/2015), MVP (mitral valve prolapse), and Nausea and vomiting. Surgical: Ms. Mcroy  has a past surgical history that includes Cesarean section; Ankle surgery (Right, 03/02/2014); Hemorrhoid surgery (N/A, 06/16/2015); Cervical spine surgery (02/21/2016); Cesarean section with bilateral tubal ligation (1990); Colonoscopy (04/19/2015); Hysteroscopy with D & C (12/24/2013); Colonoscopy with propofol (N/A, 05/18/2018); Esophagogastroduodenoscopy (egd) with propofol (N/A, 05/18/2018); Spinal cord stimulator insertion (N/A, 06/18/2018); Tubal ligation; Lumbar spinal cord simulator revision (N/A, 03/14/2020); Colonoscopy with propofol (N/A, 06/25/2023); Colonoscopy with propofol (N/A, 08/07/2023); and polypectomy (08/07/2023). Family: family history includes Arthritis in her mother; Congestive Heart Failure in her maternal grandfather; Depression in her mother; Diabetes in her maternal aunt; Diabetes Mellitus I in her maternal grandmother; Gout in her maternal aunt; Hearing loss in her father; Heart attack in her maternal grandfather; Hyperlipidemia in her father and mother; Hypertension in her father and mother; Hypothyroidism in her mother; Kidney disease  in her mother; Prostate cancer in her paternal grandfather; Stroke in her maternal grandmother; Thyroid disease in her maternal aunt; Vision loss in her  mother.  Laboratory Chemistry Profile   Renal Lab Results  Component Value Date   BUN 12 02/01/2021   CREATININE 0.85 02/01/2021   BCR 8 (L) 09/08/2017   GFR 77.34 02/01/2021   GFRAA 93 09/08/2017   GFRNONAA 81 09/08/2017    Hepatic Lab Results  Component Value Date   AST 15 02/01/2021   ALT 11 02/01/2021   ALBUMIN 4.4 02/01/2021   ALKPHOS 59 02/01/2021    Electrolytes Lab Results  Component Value Date   NA 139 02/01/2021   K 4.2 02/01/2021   CL 105 02/01/2021   CALCIUM 9.3 02/01/2021   MG 2.1 09/08/2017    Bone Lab Results  Component Value Date   VD25OH 43.74 01/10/2016   25OHVITD1 51 09/08/2017   25OHVITD2 <1.0 09/08/2017   25OHVITD3 51 09/08/2017    Inflammation (CRP: Acute Phase) (ESR: Chronic Phase) Lab Results  Component Value Date   CRP 1.4 09/08/2017   ESRSEDRATE 11 09/08/2017         Note: Above Lab results reviewed.  Recent Imaging Review  DG PAIN CLINIC C-ARM 1-60 MIN NO REPORT Fluoro was used, but no Radiologist interpretation will be provided.  Please refer to "NOTES" tab for provider progress note. Note: Reviewed        Physical Exam  General appearance: Well nourished, well developed, and well hydrated. In no apparent acute distress Mental status: Alert, oriented x 3 (person, place, & time)       Respiratory: No evidence of acute respiratory distress Eyes: PERLA Vitals: BP 133/79   Pulse 73   Temp 98.2 F (36.8 C)   Ht 5' (1.524 m)   Wt 154 lb (69.9 kg)   LMP 05/05/2017 (Exact Date)   SpO2 100%   BMI 30.08 kg/m  BMI: Estimated body mass index is 30.08 kg/m as calculated from the following:   Height as of this encounter: 5' (1.524 m).   Weight as of this encounter: 154 lb (69.9 kg). Ideal: Ideal body weight: 45.5 kg (100 lb 4.9 oz) Adjusted ideal body weight: 55.2 kg (121 lb 12.6 oz)  Assessment   Diagnosis Status  1. Mid-back pain, acute   2. Lumbar facet syndrome (Bilateral) (L>R)   3. Chronic low back pain (1ry area of  Pain) (Bilateral) (R>L) w/ sciatica (Bilateral)   4. Chronic lower extremity pain (2ry area of Pain) (Bilateral) (R>L)   5. Chronic hip pain (3ry area of Pain) (Bilateral) (R>L)   6. Chronic neck pain (4th area of Pain) (Bilateral) (R>L)   7. Chronic occipital neuralgia (5th area of Pain) (Bilateral) (R>L)   8. Chronic pain syndrome   9. Pharmacologic therapy   10. Chronic use of opiate for therapeutic purpose   11. Encounter for medication management   12. Encounter for chronic pain management   13. Accidental fall from bed, initial encounter   14. Neurostimulator device in situ   15. Presence of neurostimulator (Thoracolumbar)    Controlled Controlled Controlled   Updated Problems: Problem  Mid-Back Pain, Acute  Neurostimulator Device in Situ  Accidental Fall From Bed, Initial Encounter    Plan of Care  Problem-specific:  Assessment and Plan    Acute back pain   She experiences acute back pain after falling from a bed, with throbbing and aching in the thoracic and lumbar regions radiating into the buttocks.  There is no sharp pain, numbness, or leg weakness. Pain worsens in the morning, likely due to arthritis-related swelling, and becomes muscle-related by the end of the day. The spinal cord stimulator is functioning well. Order an x-ray of the thoracic and lumbar spine. Prescribe a 9-day tapering course of oral steroids. Schedule a follow-up phone visit in one week to discuss x-ray results and assess response to steroids. Consider physical therapy if pain persists after the steroid course. Advise using NSAIDs like ibuprofen for morning pain.  Medication management   Her current prescription of 20 tablets for 90 days is insufficient given her usage of 1-2 tablets per day, leaving her with only three pills. Increase the prescription to 30 tablets per month. Advise her to bring remaining pills to the next visit.       Ms. JULLIANNA GABOR has a current medication list which  includes the following long-term medication(s): albuterol, fluoxetine, fluticasone, linaclotide, montelukast, oxycodone, and [START ON 03/03/2024] oxycodone.  Pharmacotherapy (Medications Ordered): Meds ordered this encounter  Medications   oxyCODONE (OXY IR/ROXICODONE) 5 MG immediate release tablet    Sig: Take 1 tablet (5 mg total) by mouth daily. Must last 90 days.    Dispense:  30 tablet    Refill:  0    DO NOT: delete (not duplicate); no partial-fill (will deny script to complete), no refill request (F/U required). DISPENSE: 1 day early if closed on fill date. WARN: No CNS-depressants within 8 hrs of med.   oxyCODONE (OXY IR/ROXICODONE) 5 MG immediate release tablet    Sig: Take 1 tablet (5 mg total) by mouth daily. Must last 90 days.    Dispense:  30 tablet    Refill:  0    DO NOT: delete (not duplicate); no partial-fill (will deny script to complete), no refill request (F/U required). DISPENSE: 1 day early if closed on fill date. WARN: No CNS-depressants within 8 hrs of med.   predniSONE (DELTASONE) 20 MG tablet    Sig: Take 3 tablets (60 mg total) by mouth daily with breakfast for 3 days, THEN 2 tablets (40 mg total) daily with breakfast for 3 days, THEN 1 tablet (20 mg total) daily with breakfast for 3 days.    Dispense:  18 tablet    Refill:  0   Orders:  Orders Placed This Encounter  Procedures   DG Thoracic Spine W/Swimmers    Patient presents with axial pain with possible radicular component. Please assist [place] in identifying specific level(s) and laterality of any additional findings such as: 1. Facet (Zygapophyseal) joint DJD (Hypertrophy, space narrowing, subchondral sclerosis, and/or osteophyte formation) 2. DDD and/or IVDD (Loss of disc height, desiccation, gas patterns, osteophytes, endplate sclerosis, or "Black disc disease") 3. Pars defects 4. Spondylolisthesis, spondylosis, and/or spondyloarthropathies (include Degree/Grade of displacement in mm) (stability) 5.  Vertebral body Fractures (acute/chronic) (state percentage of collapse) 6. Demineralization (osteopenia/osteoporotic) 7. Bone pathology 8. Foraminal narrowing  9. Surgical changes    Standing Status:   Future    Expiration Date:   05/03/2024    Scheduling Instructions:     Please make sure that the patient understands that this needs to be done as soon as possible. Never have the patient do the imaging "just before the next appointment". Inform patient that having the imaging done within the [facility] will expedite the availability of the results and will provide      imaging availability to the requesting physician. In addition inform the patient that the imaging  order has an expiration date and will not be renewed if not done within the active period.    Reason for Exam (SYMPTOM  OR DIAGNOSIS REQUIRED):   Upper/thoracic back pain    Is patient pregnant?:   No    Preferred imaging location?:   [facility]    Release to patient:   Immediate    Call Results- Best Contact Number?:   [phone] [facility] [place] Lumbar Spine Complete W/Bend    Patient presents with axial pain with possible radicular component. Please assist [place] in identifying specific level(s) and laterality of any additional findings such as: 1. Facet (Zygapophyseal) joint DJD (Hypertrophy, space narrowing, subchondral sclerosis, and/or osteophyte formation) 2. DDD and/or IVDD (Loss of disc height, desiccation, gas patterns, osteophytes, endplate sclerosis, or "Black disc disease") 3. Pars defects 4. Spondylolisthesis, spondylosis, and/or spondyloarthropathies (include Degree/Grade of displacement in mm) (stability) 5. Vertebral body Fractures (acute/chronic) (state percentage of collapse) 6. Demineralization (osteopenia/osteoporotic) 7. Bone pathology 8. Foraminal narrowing  9. Surgical changes    Standing Status:   Future    Expiration Date:   05/03/2024     Scheduling Instructions:     Please make sure that the patient understands that this needs to be done as soon as possible. Never have the patient do the imaging "just before the next appointment". Inform patient that having the imaging done within the [facility] will expedite the availability of the results and will provide      imaging availability to the requesting physician. In addition inform the patient that the imaging order has an expiration date and will not be renewed if not done within the active period.    Reason for Exam (SYMPTOM  OR DIAGNOSIS REQUIRED):   Low back pain    Is patient pregnant?:   No    Preferred imaging location?:   [facility]    Call Results- Best Contact Number?:   [phone] [place]    Radiology Contrast Protocol - do NOT remove file path:   [email or web address]    Release to patient:   Immediate   Follow-up plan:   Return in about 1 week (around 02/09/2024) for Eval-day (M,W), (VV), (post-Steroid Taper), for review of ordered tests.     Interventional Therapies  Risk Factors  Considerations:   Mitral valve prolapse (MVP) GERD  urinary stress incontinence  GAD   Planned  Pending:   Diagnostic/therapeutic bilateral lumbar facet MBB R5L6    Under consideration:   Therapeutic right L1-2 & L3-4 percutaneous discectomy (Stryker compressor)  Therapeutic intrathecal pump trial    Completed:   Therapeutic right cervical ESI x2 (12/17/2022) (100/100/90/100)  Diagnostic bilateral GONB x1 (08/19/2019) (100/100/100/,50)  Palliative right cervical facet MBB x3 (07/23/2018) (100/100/100/>50)  Palliative left cervical facet MBB x4 (04/01/2019) (90/90/0/0)  Palliative right cervical facet RFA x1 (09/17/2018) (100/100/85/85)  Palliative left cervical facet RFA x1 (05/04/2019) (100/100/50/50)  Therapeutic left L1-2 LESI x1 (05/10/2021) (100/100/100 x 2 days/0)   Therapeutic right L1-2 LESI x2 (02/10/2018) (100/100/70/50-75)  Therapeutic right L1 TFESI x1 (02/10/2018) (100/100/70/50-75)  Diagnostic/therapeutic left superior cluneal NB (L2, L3 dorsal rami) x1 (04/24/2021) (0/0/0/0)  Diagnostic/therapeutic left L1 TFESI x2 (11/15/2021) (100/100/0)  Diagnostic/therapeutic left L3 TFESI x2 (11/15/2021) (100/100/0)  Bilateral lumbar spinal cord stimulator trial (done) (05/14/2018) (by me)  Bilateral lumbar spinal cord stimulator implant (09/28/2019) (by me)  Bilateral spinal cord stimulator revision (03/14/2020) (by me)  Diagnostic right lumbar facet MBB x5 (12/02/2023) (100/100/80/80)  Diagnostic left lumbar facet MBB x6 (12/02/2023) (100/100/80/80)  Diagnostic bilateral SI joint Blk x2 (09/18/2021) (100/100/100)  Palliative left lumbar facet RFA x3 (06/27/2022) (100/100/100/100)  Therapeutic right lumbar facet RFA x3 (05/21/2022) (100/100/75/75)    Therapeutic  Palliative (PRN) options:   Palliative right cervical facet RFA   Diagnostic bilateral SI joint Blk   Therapeutic bilateral lumbar facet RFA    Pharmacotherapy  Nonopioids transfer 08/23/2020: Lidocaine 5% ointment and Robaxin      Recent Visits Date Type Provider Dept  12/16/23 Office Visit Delano Metz, MD [facility]  12/02/23 Procedure visit Delano Metz, MD [facility]  11/17/23 Office Visit Delano Metz, MD [facility]  Showing recent visits within past 90 days and meeting all other requirements Today's Visits Date Type Provider Dept  02/02/24 Office Visit Delano Metz, MD [facility]  Showing today's visits and meeting all other requirements Future Appointments No visits were found meeting these conditions. Showing future appointments within next 90 days and meeting all other requirements  I discussed the assessment and treatment plan with the patient. The patient was provided an opportunity to ask questions and all  were answered. The patient agreed with the plan and demonstrated an understanding of the instructions.  Patient advised to call back or seek an in-person evaluation if the symptoms or condition worsens.  Duration of encounter: 35 minutes.  Total time on encounter, as per AMA guidelines included both the face-to-face and non-face-to-face time personally spent by the physician and/or other qualified health care professional(s) on the day of the encounter (includes time in activities that require the physician or other qualified health care professional and does not include time in activities normally performed by clinical staff). Physician's time may include the following activities when performed: Preparing to see the patient (e.g., pre-charting review of records, searching for previously ordered imaging, lab work, and nerve conduction tests) Review of prior analgesic pharmacotherapies. Reviewing PMP Interpreting ordered tests (e.g., lab work, imaging, nerve conduction tests) Performing post-procedure evaluations, including interpretation of diagnostic procedures Obtaining and/or reviewing separately obtained history Performing a medically appropriate examination and/or evaluation Counseling and educating the patient/family/caregiver Ordering medications, tests, or procedures Referring and communicating with other health care professionals (when not separately reported) Documenting clinical information in the electronic or other health record Independently interpreting results (not separately reported) and communicating results to the patient/ family/caregiver Care coordination (not separately reported)  Note by: Oswaldo Done, MD (TTS and AI technology used. I apologize for any typographical errors that were not detected and corrected.) Date: 02/02/2024; Time: 2:18 PM

## 2024-02-02 ENCOUNTER — Ambulatory Visit
Admission: RE | Admit: 2024-02-02 | Discharge: 2024-02-02 | Disposition: A | Discharge status: Home / Self Care | Source: Ambulatory Visit | Attending: Pain Medicine | Admitting: Pain Medicine

## 2024-02-02 ENCOUNTER — Ambulatory Visit (HOSPITAL_BASED_OUTPATIENT_CLINIC_OR_DEPARTMENT_OTHER): Admitting: Pain Medicine

## 2024-02-02 ENCOUNTER — Encounter: Payer: Self-pay | Admitting: Pain Medicine

## 2024-02-02 VITALS — BP 133/79 | HR 73 | Temp 98.2°F | Ht 60.0 in | Wt 154.0 lb

## 2024-02-02 DIAGNOSIS — W06XXXA Fall from bed, initial encounter: Secondary | ICD-10-CM | POA: Insufficient documentation

## 2024-02-02 DIAGNOSIS — Z79899 Other long term (current) drug therapy: Secondary | ICD-10-CM | POA: Insufficient documentation

## 2024-02-02 DIAGNOSIS — M25552 Pain in left hip: Secondary | ICD-10-CM | POA: Insufficient documentation

## 2024-02-02 DIAGNOSIS — I700 Atherosclerosis of aorta: Secondary | ICD-10-CM | POA: Diagnosis not present

## 2024-02-02 DIAGNOSIS — M48061 Spinal stenosis, lumbar region without neurogenic claudication: Secondary | ICD-10-CM

## 2024-02-02 DIAGNOSIS — M47816 Spondylosis without myelopathy or radiculopathy, lumbar region: Secondary | ICD-10-CM | POA: Insufficient documentation

## 2024-02-02 DIAGNOSIS — M542 Cervicalgia: Secondary | ICD-10-CM | POA: Insufficient documentation

## 2024-02-02 DIAGNOSIS — M549 Dorsalgia, unspecified: Secondary | ICD-10-CM

## 2024-02-02 DIAGNOSIS — M47817 Spondylosis without myelopathy or radiculopathy, lumbosacral region: Secondary | ICD-10-CM

## 2024-02-02 DIAGNOSIS — Z9682 Presence of neurostimulator: Secondary | ICD-10-CM | POA: Diagnosis not present

## 2024-02-02 DIAGNOSIS — M5481 Occipital neuralgia: Secondary | ICD-10-CM | POA: Insufficient documentation

## 2024-02-02 DIAGNOSIS — M5442 Lumbago with sciatica, left side: Secondary | ICD-10-CM | POA: Insufficient documentation

## 2024-02-02 DIAGNOSIS — M5126 Other intervertebral disc displacement, lumbar region: Secondary | ICD-10-CM

## 2024-02-02 DIAGNOSIS — M5441 Lumbago with sciatica, right side: Secondary | ICD-10-CM | POA: Insufficient documentation

## 2024-02-02 DIAGNOSIS — M47812 Spondylosis without myelopathy or radiculopathy, cervical region: Secondary | ICD-10-CM

## 2024-02-02 DIAGNOSIS — M79604 Pain in right leg: Secondary | ICD-10-CM | POA: Insufficient documentation

## 2024-02-02 DIAGNOSIS — Z79891 Long term (current) use of opiate analgesic: Secondary | ICD-10-CM | POA: Insufficient documentation

## 2024-02-02 DIAGNOSIS — M5030 Other cervical disc degeneration, unspecified cervical region: Secondary | ICD-10-CM

## 2024-02-02 DIAGNOSIS — G8929 Other chronic pain: Secondary | ICD-10-CM

## 2024-02-02 DIAGNOSIS — G4486 Cervicogenic headache: Secondary | ICD-10-CM

## 2024-02-02 DIAGNOSIS — G894 Chronic pain syndrome: Secondary | ICD-10-CM | POA: Insufficient documentation

## 2024-02-02 DIAGNOSIS — M5412 Radiculopathy, cervical region: Secondary | ICD-10-CM

## 2024-02-02 DIAGNOSIS — M25551 Pain in right hip: Secondary | ICD-10-CM | POA: Insufficient documentation

## 2024-02-02 DIAGNOSIS — M79605 Pain in left leg: Secondary | ICD-10-CM | POA: Insufficient documentation

## 2024-02-02 DIAGNOSIS — M4802 Spinal stenosis, cervical region: Secondary | ICD-10-CM

## 2024-02-02 DIAGNOSIS — M5459 Other low back pain: Secondary | ICD-10-CM

## 2024-02-02 MED ORDER — OXYCODONE HCL 5 MG PO TABS: 5.0000 mg | ORAL_TABLET | Freq: Every day | ORAL | 0 refills | Status: DC

## 2024-02-02 MED ORDER — PREDNISONE 20 MG PO TABS: ORAL_TABLET | ORAL | 0 refills | Status: AC

## 2024-02-02 NOTE — Patient Instructions (Signed)

## 2024-02-02 NOTE — Progress Notes (Signed)
 Safety precautions to be maintained throughout the outpatient stay will include: orient to surroundings, keep bed in low position, maintain call bell within reach at all times, provide assistance with transfer out of bed and ambulation.

## 2024-02-02 NOTE — Progress Notes (Signed)
 Nursing Pain Medication Assessment:  Safety precautions to be maintained throughout the outpatient stay will include: orient to surroundings, keep bed in low position, maintain call bell within reach at all times, provide assistance with transfer out of bed and ambulation.  Medication Inspection Compliance: Pill count conducted under aseptic conditions, in front of the patient. Neither the pills nor the bottle was removed from the patient's sight at any time. Once count was completed pills were immediately returned to the patient in their original bottle.  Medication: Oxycodone IR Pill/Patch Count:  3 of 20 pills remain Pill/Patch Appearance: Markings consistent with prescribed medication Bottle Appearance: Standard pharmacy container. Clearly labeled. Filled Date: 01 / 20 / 2025 Last Medication intake:  Day before yesterday

## 2024-02-03 LAB — TSH: TSH: 1.06 (ref 0.41–5.90)

## 2024-02-09 ENCOUNTER — Ambulatory Visit: Attending: Pain Medicine | Admitting: Pain Medicine

## 2024-02-09 DIAGNOSIS — G8929 Other chronic pain: Secondary | ICD-10-CM

## 2024-02-09 DIAGNOSIS — M542 Cervicalgia: Secondary | ICD-10-CM

## 2024-02-09 DIAGNOSIS — M5412 Radiculopathy, cervical region: Secondary | ICD-10-CM

## 2024-02-09 DIAGNOSIS — Z9682 Presence of neurostimulator: Secondary | ICD-10-CM

## 2024-02-09 DIAGNOSIS — M5481 Occipital neuralgia: Secondary | ICD-10-CM | POA: Diagnosis not present

## 2024-02-09 DIAGNOSIS — M5030 Other cervical disc degeneration, unspecified cervical region: Secondary | ICD-10-CM

## 2024-02-09 DIAGNOSIS — M4802 Spinal stenosis, cervical region: Secondary | ICD-10-CM

## 2024-02-09 DIAGNOSIS — G4486 Cervicogenic headache: Secondary | ICD-10-CM | POA: Diagnosis not present

## 2024-02-09 DIAGNOSIS — G8928 Other chronic postprocedural pain: Secondary | ICD-10-CM

## 2024-02-09 NOTE — Patient Instructions (Signed)

## 2024-02-09 NOTE — Progress Notes (Signed)
 PROVIDER NOTE: Interpretation of information contained herein should be left to medically-trained personnel. Specific patient instructions are provided elsewhere under "Patient Instructions" section of medical record. This document was created in part using AI and STT-dictation technology, any transcriptional errors that may result from this process are unintentional.  Patient: Karen Edwards  Service: E/M   PCP: Melburn Popper, FNP  DOB: [birth year]  DOS: 02/09/2024  Provider: Oswaldo Done, MD  MRN: [id number]  Delivery: Virtual Visit  Specialty: Interventional Pain Management  Type: Established Patient  Setting: Ambulatory outpatient facility  Specialty designation: 09  Referring Prov.: Melburn Popper, FNP  Location: Remote location       Virtual Encounter - Pain Management PROVIDER NOTE: Information contained herein reflects review and annotations entered in association with encounter. Interpretation of such information and data should be left to medically-trained personnel. Information provided to patient can be located elsewhere in the medical record under "Patient Instructions". Document created using STT-dictation technology, any transcriptional errors that may result from process are unintentional.    Contact & Pharmacy Preferred: [phone] Home: [phone] (home) Mobile: [phone] (mobile) E-mail: [email or web address]  [id number] - [place], [place] - [address] [place] [address] Phone: [phone] Fax: [phone]   Pre-screening  Karen Edwards offered "in-person" vs "virtual" encounter. She indicated preferring virtual for this encounter.   Reason COVID-19*  Social distancing based on CDC and AMA recommendations.   I contacted Karen Edwards on 02/09/2024 via telephone.      I clearly identified myself as Oswaldo Done, MD. I verified that I was speaking with the correct person using two identifiers (Name:  Karen Edwards, and date of birth: Mar 13, 1966).  Consent I sought verbal advanced consent from Karen Edwards for virtual visit interactions. I informed Karen Edwards of possible security and privacy concerns, risks, and limitations associated with providing "not-in-person" medical evaluation and management services. I also informed Karen Edwards of the availability of "in-person" appointments. Finally, I informed her that there would be a charge for the virtual visit and that she could be  personally, fully or partially, financially responsible for it. Karen Edwards expressed understanding and agreed to proceed.   Historic Elements   Karen Edwards is a 58 y.o. year old, female patient evaluated today after our last contact on 02/02/2024. Karen Edwards  has a past medical history of Anxiety, Chronic back pain, Degenerative joint disease, Depression, Headache, Hypotension, IBS (irritable bowel syndrome) (05/2015), MVP (mitral valve prolapse), and Nausea and vomiting. She also  has a past surgical history that includes Cesarean section; Ankle surgery (Right, 03/02/2014); Hemorrhoid surgery (N/A, 06/16/2015); Cervical spine surgery (02/21/2016); Cesarean section with bilateral tubal ligation (1990); Colonoscopy (04/19/2015); Hysteroscopy with D & C (12/24/2013); Colonoscopy with propofol (N/A, 05/18/2018); Esophagogastroduodenoscopy (egd) with propofol (N/A, 05/18/2018); Spinal cord stimulator insertion (N/A, 06/18/2018); Tubal ligation; Lumbar spinal cord simulator revision (N/A, 03/14/2020); Colonoscopy with propofol (N/A, 06/25/2023); Colonoscopy with propofol (N/A, 08/07/2023); and polypectomy (08/07/2023). Karen Edwards has a current medication list which includes the following prescription(s): acetaminophen, albuterol, cyanocobalamin, fluoxetine, fluticasone, linaclotide, montelukast, naloxone, ondansetron, oxycodone, [START ON 03/03/2024] oxycodone, prednisone, promethazine-dextromethorphan, scopolamine, and  zolpidem. She  reports that she has been smoking cigarettes. She has a 18.5 pack-year smoking history. She has never used smokeless tobacco. She reports current alcohol use of about 1.0 standard drink of alcohol per week. She reports that she does not use drugs. Karen Edwards is allergic to gabapentin.  BMI:  Estimated body mass index is 30.08 kg/m as calculated from the following:   Height as of 02/02/24: 5' (1.524 m).   Weight as of 02/02/24: 154 lb (69.9 kg). Last encounter: 02/02/2024. Last procedure: 12/02/2023.  HPI  Today, she is being contacted for  follow-up evaluation after steroid taper and x-rays of the lumbar and thoracic spine.  Diagnostic x-rays of the lumbar spine with bending views show no evidence of fracture or abnormalities.  No change in the location of the stimulator device.  Calcification of the aorta is seen.  No evidence of lumbar spine fracture, malalignment, or significant intervertebral disc space problems.  Diagnostic x-rays of the thoracic spine showed spinal cord stimulator device tip to be at the T8 level along the posterior epidural space.  Thoracic vertebral bodies and disc spaces are normal.  No abnormalities identified.  On 02/02/2024 the patient was given a prednisone 20 mg taper which she was due to complete on 02/10/2024.    Pharmacotherapy Assessment  Opioid Analgesic: Oxycodone IR 5 mg, 1 tab p.o. twice daily PRN (#20/90 days).  MME/day: 0 mg/day.     Monitoring: [place] PMP: PDMP reviewed during this encounter.       Pharmacotherapy: No side-effects or adverse reactions reported. Compliance: No problems identified. Effectiveness: Clinically acceptable. Plan: Refer to "POC". UDS:  Summary  Date Value Ref Range Status  07/30/2023 Note  Final    Comment:    ==================================================================== [employer] Select 13 (MW) ==================================================================== Test                             Result        Flag       Units  Drug Present and Declared for Prescription Verification   Noroxycodone                   193          EXPECTED   ng/mg creat    Noroxycodone is an expected metabolite of oxycodone. Sources of    oxycodone include scheduled prescription medications.  Drug Absent but Declared for Prescription Verification   Oxycodone                      Not Detected UNEXPECTED ng/mg creat    Oxycodone is almost always present in patients taking this drug    consistently.  Absence of oxycodone could be due to lapse of time    since the last dose or unusual pharmacokinetics (rapid metabolism).  ==================================================================== Test                      Result    Flag   Units      Ref Range   Creatinine              80               mg/dL      >=16 ==================================================================== Declared Medications:  The flagging and interpretation on this report are based on the  following declared medications.  Unexpected results may arise from  inaccuracies in the declared medications.   **Note: The testing scope of this panel includes these medications:   Oxycodone (Roxicodone)   **Note: The testing scope of this panel does not include the  following reported medications:   Acetaminophen (Tylenol)  Fluoxetine (Prozac)  Linaclotide (Linzess)  Naloxone (Narcan)  Ondansetron (Zofran)  Quetiapine (Seroquel)  Scopolamine ====================================================================  For clinical consultation, please call [phone]. ====================================================================    No results found for: "CBDTHCR", "D8THCCBX", "D9THCCBX"   Laboratory Chemistry Profile   Renal Lab Results  Component Value Date   BUN 12 02/01/2021   CREATININE 0.85 02/01/2021   BCR 8 (L) 09/08/2017   GFR 77.34 02/01/2021   GFRAA 93 09/08/2017   GFRNONAA 81 09/08/2017    Hepatic Lab Results   Component Value Date   AST 15 02/01/2021   ALT 11 02/01/2021   ALBUMIN 4.4 02/01/2021   ALKPHOS 59 02/01/2021    Electrolytes Lab Results  Component Value Date   NA 139 02/01/2021   K 4.2 02/01/2021   CL 105 02/01/2021   CALCIUM 9.3 02/01/2021   MG 2.1 09/08/2017    Bone Lab Results  Component Value Date   VD25OH 43.74 01/10/2016   25OHVITD1 51 09/08/2017   25OHVITD2 <1.0 09/08/2017   25OHVITD3 51 09/08/2017    Inflammation (CRP: Acute Phase) (ESR: Chronic Phase) Lab Results  Component Value Date   CRP 1.4 09/08/2017   ESRSEDRATE 11 09/08/2017         Note: Above Lab results reviewed.  Imaging  DG Lumbar Spine Complete W/Bend CLINICAL DATA:  Low back pain  EXAM: LUMBAR SPINE - COMPLETE WITH BENDING VIEWS  COMPARISON:  MRI lumbar January 04, 2022 lumbar series December 18, 2021  FINDINGS: There is no evidence of lumbar spine fracture. Alignment is normal. Intervertebral disc spaces are maintained.  IMPRESSION: No fractures or bony abnormalities  No change in the stimulator device.  Calcifications of the abdominal aorta.  Electronically Signed   By: Fredrich Jefferson M.D.   On: 02/04/2024 19:27 DG Thoracic Spine W/Swimmers CLINICAL DATA:  Back pain  EXAM: THORACIC SPINE - 3 VIEWS  COMPARISON:  Scoliosis series December 18, 2021 thoracic spine May 05, 2017  FINDINGS: Stimulator device the tip of which is at T8 level. Along the posterior epidural space.  Thoracic vertebral bodies and disc spaces are normal  IMPRESSION: Stimulator device the tip of which is at T8 level.  Electronically Signed   By: Fredrich Jefferson M.D.   On: 02/04/2024 19:27  Assessment  The primary encounter diagnosis was Cervicalgia. Diagnoses of Cervical foraminal stenosis (C5-6) (Bilateral), Cervical radiculopathy at C6 (Bilateral), Radicular pain of shoulder (Bilateral), Cervicogenic headache (Bilateral), Cervico-occipital neuralgia (Bilateral), Chronic cervical radicular pain  (Right: C6/C7) (Left: C5/T1), Chronic neck pain (4th area of Pain) (Bilateral) (R>L), Chronic neck pain with history of cervical spinal surgery, DDD (degenerative disc disease), cervical, and Presence of neurostimulator (Thoracolumbar) were also pertinent to this visit.  Plan of Care  Problem-specific:  No problem-specific Assessment & Plan notes found for this encounter.  Karen Edwards has a current medication list which includes the following long-term medication(s): albuterol, fluoxetine, fluticasone, linaclotide, montelukast, oxycodone, and [START ON 03/03/2024] oxycodone.  Pharmacotherapy (Medications Ordered): No orders of the defined types were placed in this encounter.  Orders:  Orders Placed This Encounter  Procedures   Cervical Epidural Injection    Sedation: Patient's choice. Purpose: Therapeutic Indication(s): Radiculitis and cervicalgia associater with cervical degenerative disc disease.    Standing Status:   Future    Expiration Date:   05/10/2024    Scheduling Instructions:     Procedure: Cervical Epidural Steroid Injection/Block     Level(s): C7-T1     Laterality: Right-sided     Timeframe: As soon as schedule allows.    Where will this procedure be performed?:   [facility]             by Dr. Barth Borne   Follow-up plan:   Return for (ECT): (R) CESI #3.      Interventional Therapies  Risk Factors  Considerations:   Mitral valve prolapse (MVP) GERD  urinary stress incontinence  GAD   Planned  Pending:   Therapeutic/palliative right cervical ESI #3 (last done on 12/17/2022)    Under consideration:   Therapeutic right L1-2 & L3-4 percutaneous discectomy ([employer] compressor)  Therapeutic intrathecal pump trial    Completed:   Therapeutic right cervical ESI x2 (12/17/2022) (100/100/90/100)  Diagnostic bilateral GONB x1 (08/19/2019) (100/100/100/,50)  Palliative right cervical facet MBB x3 (07/23/2018) (100/100/100/>50)  Palliative left cervical  facet MBB x4 (04/01/2019) (90/90/0/0)  Palliative right cervical facet RFA x1 (09/17/2018) (100/100/85/85)  Palliative left cervical facet RFA x1 (05/04/2019) (100/100/50/50)  Therapeutic left L1-2 LESI x1 (05/10/2021) (100/100/100 x 2 days/0)  Therapeutic right L1-2 LESI x2 (02/10/2018) (100/100/70/50-75)  Therapeutic right L1 TFESI x1 (02/10/2018) (100/100/70/50-75)  Diagnostic/therapeutic left superior cluneal NB (L2, L3 dorsal rami) x1 (04/24/2021) (0/0/0/0)  Diagnostic/therapeutic left L1 TFESI x2 (11/15/2021) (100/100/0)  Diagnostic/therapeutic left L3 TFESI x2 (11/15/2021) (100/100/0)  Bilateral lumbar spinal cord stimulator trial (done) (05/14/2018) (by me)  Bilateral lumbar spinal cord stimulator implant (09/28/2019) (by me)  Bilateral spinal cord stimulator revision (03/14/2020) (by me)  Therapeutic bilateral lumbar facet MBB R5L6 (12/02/2023) (100/100/80/80)  Diagnostic bilateral SI joint Blk x2 (09/18/2021) (100/100/100)  Palliative left lumbar facet RFA x3 (06/27/2022) (100/100/100/100)  Therapeutic right lumbar facet RFA x3 (05/21/2022) (100/100/75/75)    Therapeutic  Palliative (PRN) options:   Palliative right cervical facet RFA   Diagnostic bilateral SI joint Blk   Therapeutic bilateral lumbar facet RFA    Pharmacotherapy  Nonopioids transfer 08/23/2020: Lidocaine 5% ointment and Robaxin      Recent Visits Date Type Provider Dept  02/02/24 Office Visit Renaldo Caroli, MD [facility]  12/16/23 Office Visit Renaldo Caroli, MD [facility]  12/02/23 Procedure visit Renaldo Caroli, MD [facility]  11/17/23 Office Visit Renaldo Caroli, MD [facility]  Showing recent visits within past 90 days and meeting all other requirements Today's Visits Date Type Provider Dept  02/09/24 Office Visit Renaldo Caroli, MD [facility]  Showing today's visits and meeting all other requirements Future Appointments Date Type  Provider Dept  03/24/24 Appointment Renaldo Caroli, MD [facility]  Showing future appointments within next 90 days and meeting all other requirements  I discussed the assessment and treatment plan with the patient. The patient was provided an opportunity to ask questions and all were answered. The patient agreed with the plan and demonstrated an understanding of the instructions.  Patient advised to call back or seek an in-person evaluation if the symptoms or condition worsens.  Duration of encounter: 20 minutes.  Note by: Candi Chafe, MD Date: 02/09/2024; Time: 12:39 PM

## 2024-02-20 ENCOUNTER — Telehealth: Payer: Self-pay | Admitting: Pain Medicine

## 2024-02-20 NOTE — Telephone Encounter (Signed)
 Instructed patient to call her [employer] rep to have troubleshoot to see if the numbness is coming from the SCS.  Instructed to call us  back for any further questions or concerns.

## 2024-02-20 NOTE — Telephone Encounter (Signed)
 Numbness in her leg. Is this from the SCS? Please call

## 2024-03-02 ENCOUNTER — Ambulatory Visit: Admitting: Pain Medicine

## 2024-03-10 NOTE — Patient Instructions (Signed)

## 2024-03-11 ENCOUNTER — Encounter: Payer: Self-pay | Admitting: Pain Medicine

## 2024-03-11 ENCOUNTER — Ambulatory Visit
Admission: RE | Admit: 2024-03-11 | Discharge: 2024-03-11 | Disposition: A | Discharge status: Home / Self Care | Source: Ambulatory Visit | Attending: Pain Medicine | Admitting: Pain Medicine

## 2024-03-11 ENCOUNTER — Ambulatory Visit: Attending: Pain Medicine | Admitting: Pain Medicine

## 2024-03-11 VITALS — BP 99/53 | HR 64 | Temp 97.7°F | Resp 11 | Ht 60.0 in | Wt 145.0 lb

## 2024-03-11 DIAGNOSIS — M5030 Other cervical disc degeneration, unspecified cervical region: Secondary | ICD-10-CM | POA: Insufficient documentation

## 2024-03-11 DIAGNOSIS — G8928 Other chronic postprocedural pain: Secondary | ICD-10-CM | POA: Insufficient documentation

## 2024-03-11 DIAGNOSIS — M5481 Occipital neuralgia: Secondary | ICD-10-CM | POA: Diagnosis present

## 2024-03-11 DIAGNOSIS — G8929 Other chronic pain: Secondary | ICD-10-CM | POA: Insufficient documentation

## 2024-03-11 DIAGNOSIS — M542 Cervicalgia: Secondary | ICD-10-CM | POA: Insufficient documentation

## 2024-03-11 DIAGNOSIS — M5412 Radiculopathy, cervical region: Secondary | ICD-10-CM | POA: Insufficient documentation

## 2024-03-11 DIAGNOSIS — G4486 Cervicogenic headache: Secondary | ICD-10-CM | POA: Insufficient documentation

## 2024-03-11 DIAGNOSIS — M4802 Spinal stenosis, cervical region: Secondary | ICD-10-CM | POA: Diagnosis present

## 2024-03-11 DIAGNOSIS — Z9889 Other specified postprocedural states: Secondary | ICD-10-CM | POA: Insufficient documentation

## 2024-03-11 MED ORDER — PENTAFLUOROPROP-TETRAFLUOROETH EX AERO
INHALATION_SPRAY | Freq: Once | CUTANEOUS | Status: AC
  Administered 2024-03-11: 30 via TOPICAL

## 2024-03-11 MED ORDER — LIDOCAINE HCL 2 % IJ SOLN
20.0000 mL | Freq: Once | INTRAMUSCULAR | Status: AC
  Administered 2024-03-11: 400 mg

## 2024-03-11 MED ORDER — MIDAZOLAM HCL 5 MG/5ML IJ SOLN
INTRAMUSCULAR | Status: AC
  Filled 2024-03-11: qty 5

## 2024-03-11 MED ORDER — SODIUM CHLORIDE (PF) 0.9 % IJ SOLN
INTRAMUSCULAR | Status: AC
Start: 2024-03-11 — End: ?
  Filled 2024-03-11: qty 10

## 2024-03-11 MED ORDER — ROPIVACAINE HCL 2 MG/ML IJ SOLN
1.0000 mL | Freq: Once | INTRAMUSCULAR | Status: AC
Start: 2024-03-11 — End: 2024-03-11
  Administered 2024-03-11: 1 mL via EPIDURAL

## 2024-03-11 MED ORDER — FENTANYL CITRATE (PF) 100 MCG/2ML IJ SOLN
25.0000 ug | INTRAMUSCULAR | Status: DC | PRN
Start: 2024-03-11 — End: 2024-03-11
  Administered 2024-03-11: 50 ug via INTRAVENOUS

## 2024-03-11 MED ORDER — DEXAMETHASONE SODIUM PHOSPHATE 10 MG/ML IJ SOLN
10.0000 mg | Freq: Once | INTRAMUSCULAR | Status: AC
Start: 2024-03-11 — End: 2024-03-11
  Administered 2024-03-11: 10 mg

## 2024-03-11 MED ORDER — ROPIVACAINE HCL 2 MG/ML IJ SOLN
INTRAMUSCULAR | Status: AC
  Filled 2024-03-11: qty 20

## 2024-03-11 MED ORDER — IOHEXOL 180 MG/ML  SOLN
INTRAMUSCULAR | Status: AC
Start: 2024-03-11 — End: ?
  Filled 2024-03-11: qty 10

## 2024-03-11 MED ORDER — IOHEXOL 180 MG/ML  SOLN
10.0000 mL | Freq: Once | INTRAMUSCULAR | Status: AC
  Administered 2024-03-11: 10 mL via EPIDURAL

## 2024-03-11 MED ORDER — MIDAZOLAM HCL 5 MG/5ML IJ SOLN
0.5000 mg | Freq: Once | INTRAMUSCULAR | Status: AC
Start: 2024-03-11 — End: 2024-03-11
  Administered 2024-03-11: 2 mg via INTRAVENOUS

## 2024-03-11 MED ORDER — LIDOCAINE HCL 2 % IJ SOLN
INTRAMUSCULAR | Status: AC
  Filled 2024-03-11: qty 20

## 2024-03-11 MED ORDER — DEXAMETHASONE SODIUM PHOSPHATE 10 MG/ML IJ SOLN
INTRAMUSCULAR | Status: AC
  Filled 2024-03-11: qty 1

## 2024-03-11 MED ORDER — SODIUM CHLORIDE 0.9% FLUSH
1.0000 mL | Freq: Once | INTRAVENOUS | Status: AC
Start: 2024-03-11 — End: 2024-03-11
  Administered 2024-03-11: 1 mL

## 2024-03-11 MED ORDER — FENTANYL CITRATE (PF) 100 MCG/2ML IJ SOLN
INTRAMUSCULAR | Status: AC
  Filled 2024-03-11: qty 2

## 2024-03-11 NOTE — Progress Notes (Signed)
 PROVIDER NOTE: Interpretation of information contained herein should be left to medically-trained personnel. Specific patient instructions are provided elsewhere under "Patient Instructions" section of medical record. This document was created in part using STT-dictation technology, any transcriptional errors that may result from this process are unintentional.  Patient: Karen Edwards Type: Established DOB: 04-16-1966 MRN: [id number] PCP: Athena Bland, FNP  Service: Procedure DOS: 03/11/2024 Setting: Ambulatory Location: Ambulatory outpatient facility Delivery: Face-to-face Provider: Candi Chafe, MD Specialty: Interventional Pain Management Specialty designation: 09 Location: Outpatient facility Ref. Prov.: Athena Bland, FNP       Interventional Therapy   Procedure: Cervical Epidural Steroid injection (CESI) (Interlaminar) #3  Laterality: Right  Level: C7-T1 Imaging: Fluoroscopy-assisted DOS: 03/11/2024  Performed by: Renaldo Caroli, MD Anesthesia: Local anesthesia (1-2% Lidocaine ) Anxiolysis: IV Versed          Sedation: Moderate Sedation                         Purpose: Diagnostic/Therapeutic Indications: Cervicalgia, cervical radicular pain, degenerative disc disease, severe enough to impact quality of life or function. 1. Cervical radiculopathy at C6 (Bilateral)   2. Cervical foraminal stenosis (C5-6) (Bilateral)   3. Cervicalgia   4. Cervico-occipital neuralgia (Bilateral)   5. Cervicogenic headache (Bilateral)   6. Chronic cervical radicular pain (Right: C6/C7) (Left: C5/T1)   7. Chronic neck pain (4th area of Pain) (Bilateral) (R>L)   8. Chronic neck pain with history of cervical spinal surgery   9. DDD (degenerative disc disease), cervical    NAS-11 score:   Pre-procedure: 7 /10   Post-procedure: 7 /10      Position  Prep  Materials:  Location setting: Procedure suite Position: Prone, on modified reverse trendelenburg to facilitate  breathing, with head in head-cradle. Pillows positioned under chest (below chin-level) with cervical spine flexed. Safety Precautions: Patient was assessed for positional comfort and pressure points before starting the procedure. Prepping solution: DuraPrep (Iodine Povacrylex [0.7% available iodine] and Isopropyl Alcohol, 74% w/w) Prep Area: Entire  cervicothoracic region Approach: percutaneous, paramedial Intended target: Posterior cervical epidural space Materials Procedure:  Tray: Epidural Needle(s): Epidural (Tuohy) Qty: 1 Length: (90mm) 3.5-inch Gauge: 17G  H&P (Pre-op Assessment):  Karen Edwards is a 58 y.o. (year old), female patient, seen today for interventional treatment. She  has a past surgical history that includes Cesarean section; Ankle surgery (Right, 03/02/2014); Hemorrhoid surgery (N/A, 06/16/2015); Cervical spine surgery (02/21/2016); Cesarean section with bilateral tubal ligation (1990); Colonoscopy (04/19/2015); Hysteroscopy with D & C (12/24/2013); Colonoscopy with propofol  (N/A, 05/18/2018); Esophagogastroduodenoscopy (egd) with propofol  (N/A, 05/18/2018); Spinal cord stimulator insertion (N/A, 06/18/2018); Tubal ligation; Lumbar spinal cord simulator revision (N/A, 03/14/2020); Colonoscopy with propofol  (N/A, 06/25/2023); Colonoscopy with propofol  (N/A, 08/07/2023); and polypectomy (08/07/2023). [name] has a current medication list which includes the following prescription(s): acetaminophen , albuterol, atorvastatin , cyanocobalamin , fluoxetine , fluticasone, linaclotide , montelukast, naloxone , ondansetron , oxycodone , promethazine -dextromethorphan, scopolamine, zolpidem , zolpidem , and oxycodone , and the following Facility-Administered Medications: fentanyl , lidocaine , midazolam , pentafluoroprop-tetrafluoroeth, and sodium chloride  flush. Her primarily concern today is the Neck Pain  Initial Vital Signs:  Pulse/HCG Rate: 72  Temp: 98.1 F (36.7 C) Resp: 14 BP: 126/76 SpO2: 99  %  BMI: Estimated body mass index is 28.32 kg/m as calculated from the following:   Height as of this encounter: 5' (1.524 m).   Weight as of this encounter: 145 lb (65.8 kg).  Risk Assessment: Allergies: Reviewed. She is allergic to gabapentin .  Allergy Precautions: None required Coagulopathies: Reviewed. None identified.  Blood-thinner therapy: None at this time Active Infection(s): Reviewed. None identified. Karen Edwards is afebrile  Site Confirmation: Karen Edwards was asked to confirm the procedure and laterality before marking the site Procedure checklist: Completed Consent: Before the procedure and under the influence of no sedative(s), amnesic(s), or anxiolytics, the patient was informed of the treatment options, risks and possible complications. To fulfill our ethical and legal obligations, as recommended by the American Medical Association's Code of Ethics, I have informed the patient of my clinical impression; the nature and purpose of the treatment or procedure; the risks, benefits, and possible complications of the intervention; the alternatives, including doing nothing; the risk(s) and benefit(s) of the alternative treatment(s) or procedure(s); and the risk(s) and benefit(s) of doing nothing. The patient was provided information about the general risks and possible complications associated with the procedure. These may include, but are not limited to: failure to achieve desired goals, infection, bleeding, organ or nerve damage, allergic reactions, paralysis, and death. In addition, the patient was informed of those risks and complications associated to Spine-related procedures, such as failure to decrease pain; infection (i.e.: Meningitis, epidural or intraspinal abscess); bleeding (i.e.: epidural hematoma, subarachnoid hemorrhage, or any other type of intraspinal or peri-dural bleeding); organ or nerve damage (i.e.: Any type of peripheral nerve, nerve root, or spinal cord injury) with  subsequent damage to sensory, motor, and/or autonomic systems, resulting in permanent pain, numbness, and/or weakness of one or several areas of the body; allergic reactions; (i.e.: anaphylactic reaction); and/or death. Furthermore, the patient was informed of those risks and complications associated with the medications. These include, but are not limited to: allergic reactions (i.e.: anaphylactic or anaphylactoid reaction(s)); adrenal axis suppression; blood sugar elevation that in diabetics may result in ketoacidosis or comma; water  retention that in patients with history of congestive heart failure may result in shortness of breath, pulmonary edema, and decompensation with resultant heart failure; weight gain; swelling or edema; medication-induced neural toxicity; particulate matter embolism and blood vessel occlusion with resultant organ, and/or nervous system infarction; and/or aseptic necrosis of one or more joints. Finally, the patient was informed that Medicine is not an exact science; therefore, there is also the possibility of unforeseen or unpredictable risks and/or possible complications that may result in a catastrophic outcome. The patient indicated having understood very clearly. We have given the patient no guarantees and we have made no promises. Enough time was given to the patient to ask questions, all of which were answered to the patient's satisfaction. Ms. Warfield has indicated that she wanted to continue with the procedure. Attestation: I, the ordering provider, attest that I have discussed with the patient the benefits, risks, side-effects, alternatives, likelihood of achieving goals, and potential problems during recovery for the procedure that I have provided informed consent. Date  Time: 03/11/2024 10:43 AM  Pre-Procedure Preparation:  Monitoring: As per clinic protocol. Respiration, ETCO2, SpO2, BP, heart rate and rhythm monitor placed and checked for adequate function Safety  Precautions: Patient was assessed for positional comfort and pressure points before starting the procedure. Time-out: I initiated and conducted the "Time-out" before starting the procedure, as per protocol. The patient was asked to participate by confirming the accuracy of the "Time Out" information. Verification of the correct person, site, and procedure were performed and confirmed by me, the nursing staff, and the patient. "Time-out" conducted as per Joint Commission's Universal Protocol (UP.01.01.01). Time: 1215 Start Time: 1216 hrs.  Description  Narrative of Procedure:  Rationale (medical necessity): procedure needed and proper for the diagnosis and/or treatment of the patient's medical symptoms and needs. Start Time: 1216 hrs. Safety Precautions: Aspiration looking for blood return was conducted prior to all injections. At no point did we inject any substances, as a needle was being advanced. No attempts were made at seeking any paresthesias. Safe injection practices and needle disposal techniques used. Medications properly checked for expiration dates. SDV (single dose vial) medications used. Description of procedure: Protocol guidelines were followed. The patient was assisted into a comfortable position. The target area was identified and the area prepped in the usual manner. Skin & deeper tissues infiltrated with local anesthetic. Appropriate amount of time allowed to pass for local anesthetics to take effect. Using fluoroscopic guidance, the epidural needle was introduced through the skin, ipsilateral to the reported pain, and advanced to the target area. Posterior laminar os was contacted and the needle walked caudad, until the lamina was cleared. The ligamentum flavum was engaged and the epidural space identified using "loss-of-resistance technique" with 2-3 ml of PF-NaCl (0.9% NSS), in a 5cc dedicated LOR syringe. (See "Imaging guidance" below for use of contrast details.) Once proper  needle placement was secured, and negative aspiration confirmed, the solution was injected in intermittent fashion, asking for systemic symptoms every 0.5cc. The needles were then removed and the area cleansed, making sure to leave some of the prepping solution back to take advantage of its long term bactericidal properties.  Vitals:   03/11/24 1043 03/11/24 1214 03/11/24 1219 03/11/24 1221  BP: 126/76 118/61 109/60 103/60  Pulse: 72 68 70 64  Resp: 14 16 16 16   Temp: 98.1 F (36.7 C)     TempSrc: Temporal     SpO2: 99% 100% 100% 100%  Weight: 145 lb (65.8 kg)     Height: 5' (1.524 m)        End Time: 1221 hrs.  Imaging Guidance (Spinal):          Type of Imaging Technique: Fluoroscopy Guidance (Spinal) Indication(s): Fluoroscopy guidance for needle placement to enhance accuracy in procedures requiring precise needle localization for targeted delivery of medication in or near specific anatomical locations not easily accessible without such real-time imaging assistance. Exposure Time: Please see nurses notes. Contrast: Before injecting any contrast, we confirmed that the patient did not have an allergy to iodine, shellfish, or radiological contrast. Once satisfactory needle placement was completed at the desired level, radiological contrast was injected. Contrast injected under live fluoroscopy. No contrast complications. See chart for type and volume of contrast used. Fluoroscopic Guidance: I was personally present during the use of fluoroscopy. "Tunnel Vision Technique" used to obtain the best possible view of the target area. Parallax error corrected before commencing the procedure. "Direction-depth-direction" technique used to introduce the needle under continuous pulsed fluoroscopy. Once target was reached, antero-posterior, oblique, and lateral fluoroscopic projection used confirm needle placement in all planes. Images permanently stored in EMR. Interpretation: I personally interpreted  the imaging intraoperatively. Adequate needle placement confirmed in multiple planes. Appropriate spread of contrast into desired area was observed. No evidence of afferent or efferent intravascular uptake. No intrathecal or subarachnoid spread observed. Permanent images saved into the patient's record.  Post-operative Assessment:  Post-procedure Vital Signs:  Pulse/HCG Rate: 64  Temp: 98.1 F (36.7 C) Resp: 16 BP: 103/60 SpO2: 100 %  EBL: None  Complications: No immediate post-treatment complications observed by team, or reported by patient.  Note: The patient tolerated the entire procedure well. A repeat  set of vitals were taken after the procedure and the patient was kept under observation following institutional policy, for this type of procedure. Post-procedural neurological assessment was performed, showing return to baseline, prior to discharge. The patient was provided with post-procedure discharge instructions, including a section on how to identify potential problems. Should any problems arise concerning this procedure, the patient was given instructions to immediately contact us , at any time, without hesitation. In any case, we plan to contact the patient by telephone for a follow-up status report regarding this interventional procedure.  Comments:  No additional relevant information.  Plan of Care (POC)  Orders:  Orders Placed This Encounter  Procedures   Cervical Epidural Injection    Indication(s): Radiculitis and cervicalgia associated with cervical degenerative disc disease. Position: Prone Imaging guidance: Fluoroscopy required. Contrast required unless contraindicated by allergy or severe CKD. Equipment & Materials: Epidural tray & needle.    Scheduling Instructions:     Procedure: Cervical Epidural Steroid Injection/Block     Planned Level(s): C7-T1     Laterality: Right-sided     Anxiolysis: Patient's choice.     Timeframe: Today    Where will this procedure be  performed?:   [facility]             by Dr. Lorin Room [facility] C-ARM 1-60 MIN NO REPORT    Intraoperative interpretation by procedural physician at [facility].    Standing Status:   Standing    Number of Occurrences:   1    Reason for exam::   Assistance in needle guidance and placement for procedures requiring needle placement in or near specific anatomical locations not easily accessible without such assistance.   Informed Consent Details: Physician/Practitioner Attestation; Transcribe to consent form and obtain patient signature    Nursing instructions: Transcribe to consent form and obtain patient signature. Always confirm laterality of pain with Ms. Marlane Silver, before procedure.    Physician/Practitioner attestation of informed consent for procedure/surgical case:   I, the physician/practitioner, attest that I have discussed with the patient the benefits, risks, side effects, alternatives, likelihood of achieving goals and potential problems during recovery for the procedure that I have provided informed consent.    Procedure:   Cervical Epidural Steroid Injection (CESI) under fluoroscopic guidance    Physician/Practitioner performing the procedure:   Ursala Cressy A. Barth Borne MD    Indication/Reason:   Indications: Cervicalgia (neck pain), cervical radicular pain, radiculitis (arm/shoulder pain, numbness, and/or weakness), degenerative disc disease, severe enough to greatly impact quality of life or function.   Provide equipment / supplies at bedside    Procedural tray: Epidural Tray (Disposable  single use) Skin infiltration needle: Regular 1.5-in, 25-G, (x1) Block needle size: Regular standard Catheter: No catheter required    Standing Status:   Standing    Number of Occurrences:   1    Specify:   Epidural Tray   Saline lock IV    Have LR [phone] mL available and administer at 125 mL/hr if patient becomes hypotensive.    Standing Status:   Standing    Number of  Occurrences:   1   Chronic Opioid Analgesic:  Oxycodone  IR 5 mg, 1 tab p.o. twice daily PRN (#20/90 days).  MME/day: 0 mg/day.   Medications ordered for procedure: Meds ordered this encounter  Medications   iohexol  (OMNIPAQUE ) 180 MG/ML injection 10 mL    Must be Myelogram-compatible. If not available, you may substitute with a water -soluble, non-ionic, hypoallergenic, myelogram-compatible  radiological contrast medium.   lidocaine  (XYLOCAINE ) 2 % (with pres) injection 400 mg   pentafluoroprop-tetrafluoroeth (GEBAUERS) aerosol   midazolam  (VERSED ) 5 MG/5ML injection 0.5-2 mg    Make sure Flumazenil is available in the pyxis when using this medication. If oversedation occurs, administer 0.2 mg IV over 15 sec. If after 45 sec no response, administer 0.2 mg again over 1 min; may repeat at 1 min intervals; not to exceed 4 doses (1 mg)   fentaNYL  (SUBLIMAZE ) injection 25-50 mcg    Make sure Narcan  is available in the pyxis when using this medication. In the event of respiratory depression (RR< 8/min): Titrate NARCAN  (naloxone ) in increments of 0.1 to 0.2 mg IV at 2-3 minute intervals, until desired degree of reversal.   sodium chloride  flush (NS) 0.9 % injection 1 mL   ropivacaine  (PF) 2 mg/mL (0.2%) (NAROPIN ) injection 1 mL   dexamethasone  (DECADRON ) injection 10 mg   Medications administered: We administered iohexol , ropivacaine  (PF) 2 mg/mL (0.2%), and dexamethasone .  See the medical record for exact dosing, route, and time of administration.  Follow-up plan:   Return in about 2 weeks (around 03/25/2024) for (Face2F), (PPE).       Interventional Therapies  Risk Factors  Considerations:   Mitral valve prolapse (MVP) GERD  urinary stress incontinence  GAD   Planned  Pending:   Therapeutic/palliative right cervical ESI #3 (last done on 12/17/2022)    Under consideration:   Therapeutic right L1-2 & L3-4 percutaneous discectomy (Stryker compressor)  Therapeutic intrathecal pump  trial    Completed:   Therapeutic right cervical ESI x2 (12/17/2022) (100/100/90/100)  Diagnostic bilateral GONB x1 (08/19/2019) (100/100/100/,50)  Palliative right cervical facet MBB x3 (07/23/2018) (100/100/100/>50)  Palliative left cervical facet MBB x4 (04/01/2019) (90/90/0/0)  Palliative right cervical facet RFA x1 (09/17/2018) (100/100/85/85)  Palliative left cervical facet RFA x1 (05/04/2019) (100/100/50/50)  Therapeutic left L1-2 LESI x1 (05/10/2021) (100/100/100 x 2 days/0)  Therapeutic right L1-2 LESI x2 (02/10/2018) (100/100/70/50-75)  Therapeutic right L1 TFESI x1 (02/10/2018) (100/100/70/50-75)  Diagnostic/therapeutic left superior cluneal NB (L2, L3 dorsal rami) x1 (04/24/2021) (0/0/0/0)  Diagnostic/therapeutic left L1 TFESI x2 (11/15/2021) (100/100/0)  Diagnostic/therapeutic left L3 TFESI x2 (11/15/2021) (100/100/0)  Bilateral lumbar spinal cord stimulator trial (done) (05/14/2018) (by me)  Bilateral lumbar spinal cord stimulator implant (09/28/2019) (by me)  Bilateral spinal cord stimulator revision (03/14/2020) (by me)  Therapeutic bilateral lumbar facet MBB R5L6 (12/02/2023) (100/100/80/80)  Diagnostic bilateral SI joint Blk x2 (09/18/2021) (100/100/100)  Palliative left lumbar facet RFA x3 (06/27/2022) (100/100/100/100)  Therapeutic right lumbar facet RFA x3 (05/21/2022) (100/100/75/75)    Therapeutic  Palliative (PRN) options:   Palliative right cervical facet RFA   Diagnostic bilateral SI joint Blk   Therapeutic bilateral lumbar facet RFA    Pharmacotherapy  Nonopioids transfer 08/23/2020: Lidocaine  5% ointment and Robaxin        Recent Visits Date Type Provider Dept  02/09/24 Office Visit Renaldo Caroli, MD [facility]  02/02/24 Office Visit Renaldo Caroli, MD [facility]  12/16/23 Office Visit Renaldo Caroli, MD [facility]  Showing recent visits within past 90 days and meeting all other requirements Today's Visits Date Type  Provider Dept  03/11/24 Procedure visit Renaldo Caroli, MD [facility]  Showing today's visits and meeting all other requirements Future Appointments Date Type Provider Dept  03/24/24 Appointment Renaldo Caroli, MD [facility]  Showing future appointments within next 90 days and meeting all other requirements  Disposition: Discharge home  Discharge (Date  Time): 03/11/2024;   hrs.   Primary Care Physician: Athena Bland, FNP Location: [facility] Note by: Candi Chafe, MD (TTS technology used. I apologize for any typographical errors that were not detected and corrected.) Date: 03/11/2024; Time: 12:25 PM  Disclaimer:  Medicine is not an Visual merchandiser. The only guarantee in medicine is that nothing is guaranteed. It is important to note that the decision to proceed with this intervention was based on the information collected from the patient. The Data and conclusions were drawn from the patient's questionnaire, the interview, and the physical examination. Because the information was provided in large part by the patient, it cannot be guaranteed that it has not been purposely or unconsciously manipulated. Every effort has been made to obtain as much relevant data as possible for this evaluation. It is important to note that the conclusions that lead to this procedure are derived in large part from the available data. Always take into account that the treatment will also be dependent on availability of resources and existing treatment guidelines, considered by other Pain Management Practitioners as being common knowledge and practice, at the time of the intervention. For Medico-Legal purposes, it is also important to point out that variation in procedural techniques and pharmacological choices are the acceptable norm. The indications, contraindications, technique, and results of the above procedure should only be interpreted and  judged by a Board-Certified Interventional Pain Specialist with extensive familiarity and expertise in the same exact procedure and technique.

## 2024-03-12 ENCOUNTER — Telehealth: Payer: Self-pay | Admitting: *Deleted

## 2024-03-12 NOTE — Telephone Encounter (Signed)
 Attempted to call for post procedure follow-up. Message left.

## 2024-03-24 ENCOUNTER — Ambulatory Visit: Attending: Pain Medicine | Admitting: Nurse Practitioner

## 2024-03-24 VITALS — BP 126/73 | HR 81 | Temp 98.0°F | Resp 16 | Ht 60.0 in | Wt 145.0 lb

## 2024-03-24 DIAGNOSIS — Z79891 Long term (current) use of opiate analgesic: Secondary | ICD-10-CM | POA: Diagnosis present

## 2024-03-24 DIAGNOSIS — M7062 Trochanteric bursitis, left hip: Secondary | ICD-10-CM | POA: Diagnosis not present

## 2024-03-24 DIAGNOSIS — M79604 Pain in right leg: Secondary | ICD-10-CM | POA: Diagnosis present

## 2024-03-24 DIAGNOSIS — M25551 Pain in right hip: Secondary | ICD-10-CM | POA: Insufficient documentation

## 2024-03-24 DIAGNOSIS — M79605 Pain in left leg: Secondary | ICD-10-CM | POA: Diagnosis present

## 2024-03-24 DIAGNOSIS — M5481 Occipital neuralgia: Secondary | ICD-10-CM | POA: Insufficient documentation

## 2024-03-24 DIAGNOSIS — M25552 Pain in left hip: Secondary | ICD-10-CM | POA: Insufficient documentation

## 2024-03-24 DIAGNOSIS — G894 Chronic pain syndrome: Secondary | ICD-10-CM | POA: Diagnosis present

## 2024-03-24 DIAGNOSIS — M47816 Spondylosis without myelopathy or radiculopathy, lumbar region: Secondary | ICD-10-CM | POA: Diagnosis present

## 2024-03-24 DIAGNOSIS — M5442 Lumbago with sciatica, left side: Secondary | ICD-10-CM | POA: Insufficient documentation

## 2024-03-24 DIAGNOSIS — Z79899 Other long term (current) drug therapy: Secondary | ICD-10-CM | POA: Diagnosis present

## 2024-03-24 DIAGNOSIS — G8929 Other chronic pain: Secondary | ICD-10-CM | POA: Diagnosis present

## 2024-03-24 DIAGNOSIS — M542 Cervicalgia: Secondary | ICD-10-CM | POA: Diagnosis present

## 2024-03-24 DIAGNOSIS — M5441 Lumbago with sciatica, right side: Secondary | ICD-10-CM | POA: Diagnosis present

## 2024-03-24 MED ORDER — OXYCODONE HCL 5 MG PO TABS
5.0000 mg | ORAL_TABLET | Freq: Every day | ORAL | 0 refills | Status: DC
Start: 2024-04-23 — End: 2024-06-23

## 2024-03-24 MED ORDER — OXYCODONE HCL 5 MG PO TABS: 5.0000 mg | ORAL_TABLET | Freq: Every day | ORAL | 0 refills | Status: DC

## 2024-03-24 MED ORDER — OXYCODONE HCL 5 MG PO TABS
5.0000 mg | ORAL_TABLET | Freq: Every day | ORAL | 0 refills | Status: DC
Start: 2024-03-24 — End: 2024-06-23

## 2024-03-24 NOTE — Progress Notes (Signed)
 Department: [facility] Date: 03/24/2024  Note: Case reviewed as supervising provider.  The patient presents today with some tenderness to palpation over the left trochanteric bursa.  Left trochanteric bursa injection offered.  Patient indicates being interested in that alternative.  Will schedule for her to come in as soon as possible for the left trochanteric bursa injection.  Today she has also indicated beginning to experience some discomfort in the upper portion of the lower back which may be due to the fact that her lumbar facet radiofrequency ablation was last done in 2023.

## 2024-03-24 NOTE — Progress Notes (Signed)
 Nursing Pain Medication Assessment:  Safety precautions to be maintained throughout the outpatient stay will include: orient to surroundings, keep bed in low position, maintain call bell within reach at all times, provide assistance with transfer out of bed and ambulation.  Medication Inspection Compliance: Pill count conducted under aseptic conditions, in front of the patient. Neither the pills nor the bottle was removed from the patient's sight at any time. Once count was completed pills were immediately returned to the patient in their original bottle.  Medication: See above Pill/Patch Cooxyunt: 2 of 30 pills/patches remain Pill/Patch Appearance: Markings consistent with prescribed medication Bottle Appearance: Standard pharmacy container. Clearly labeled. Filled Date: 4 / 7 / 2025 Last Medication intake:  TodaySafety precautions to be maintained throughout the outpatient stay will include: orient to surroundings, keep bed in low position, maintain call bell within reach at all times, provide assistance with transfer out of bed and ambulation.

## 2024-03-24 NOTE — Progress Notes (Signed)
 PROVIDER NOTE: Interpretation of information contained herein should be left to medically-trained personnel. Specific patient instructions are provided elsewhere under "Patient Instructions" section of medical record. This document was created in part using AI and STT-dictation technology, any transcriptional errors that may result from this process are unintentional.  Patient: Karen Edwards  Service: E/M   PCP: Athena Bland, FNP  DOB: [birth year]  DOS: 03/24/2024  Provider: Cherylin Corrigan, NP  MRN: [id number]  Delivery: Face-to-face  Specialty: Interventional Pain Management  Type: Established Patient  Setting: Ambulatory outpatient facility  Specialty designation: 09  Referring Prov.: Athena Bland, FNP  Location: Outpatient office facility       History of present illness (HPI) Karen Edwards, a 58 y.o. year old female, is here today because of her Trochanteric bursitis, left hip [M70.62]. Karen Edwards primary complain today is Back Pain (Mid-lower) and Neck Pain (Denies any pain to neck or arms at this time)   Pain Assessment: Severity of Chronic pain is reported as a 6 /10. Location: Back Right, Left, Lower, Mid/buttocks bilateral to side of thighs. Onset: More than a month ago. Quality: Aching, Constant, Radiating, Restless (left hip feels numb). Timing: Constant. Modifying factor(s): rest, ROM, medications. Vitals:  height is 5' (1.524 m) and weight is 145 lb (65.8 kg). Her temperature is 98 F (36.7 C). Her blood pressure is 126/73 and her pulse is 81. Her respiration is 16 and oxygen saturation is 100%.  BMI: Estimated body mass index is 28.32 kg/m as calculated from the following:   Height as of this encounter: 5' (1.524 m).   Weight as of this encounter: 145 lb (65.8 kg).  Last encounter: 02/09/2024 Last procedure: 03/11/2024.  Reason for encounter: both, medication management and post-procedure evaluation and assessment. The patient reports experiencing 100% pain  relief and improved function during the period of local anesthetic efficacy.  After the anesthetic wore off he continues to report approximately 85% sustained improvement in both pain and function.    Procedure Procedure: Cervical Epidural Steroid injection (CESI) (Interlaminar) #3  Laterality: Right  Level: C7-T1 Imaging: Fluoroscopy-assisted DOS: 03/11/2024  Performed by: Renaldo Caroli, MD Anesthesia: Local anesthesia (1-2% Lidocaine ) Anxiolysis: IV Versed          Sedation: Moderate Sedation                          Purpose: Diagnostic/Therapeutic Indications: Cervicalgia, cervical radicular pain, degenerative disc disease, severe enough to impact quality of life or function. 1. Cervical radiculopathy at C6 (Bilateral)   2. Cervical foraminal stenosis (C5-6) (Bilateral)   3. Cervicalgia   4. Cervico-occipital neuralgia (Bilateral)   5. Cervicogenic headache (Bilateral)   6. Chronic cervical radicular pain (Right: C6/C7) (Left: C5/T1)   7. Chronic neck pain (4th area of Pain) (Bilateral) (R>L)   8. Chronic neck pain with history of cervical spinal surgery   9. DDD (degenerative disc disease), cervical     NAS-11 score:              Pre-procedure: 7 /10              Post-procedure: 7 /10   Post-Procedure Evaluation Effectiveness:  Initial hour after procedure: 100 %  Subsequent 4-6 hours post-procedure: 100 %  Analgesia past initial 6 hours: 85 %  Ongoing improvement:  Analgesic: Karen Edwards underwent a therapeutic Cervical Epidural Steroid injection(CESI) on Mar 11, 2024.  The patient reports experiencing 100% pain relief and improved  function during the period of local anesthetic efficacy.  After the anesthetic wore off he continues to report approximately 85% sustained improvement in both pain and function.  Function: Karen Edwards reports improvement in function ROM: Karen Edwards reports improvement in ROM  Pharmacotherapy Assessment   Analgesic: Oxycodone  (Oxy  IR/Roxicodone ) 5 mg immediate release tablet daily for pain. MME=7.50 Monitoring: [place] PMP: PDMP reviewed during this encounter.       Pharmacotherapy: No side-effects or adverse reactions reported. Compliance: No problems identified. Effectiveness: Clinically acceptable.  Karen Palm, RN  03/24/2024  1:29 PM  Sign when Signing Visit Nursing Pain Medication Assessment:  Safety precautions to be maintained throughout the outpatient stay will include: orient to surroundings, keep bed in low position, maintain call bell within reach at all times, provide assistance with transfer out of bed and ambulation.  Medication Inspection Compliance: Pill count conducted under aseptic conditions, in front of the patient. Neither the pills nor the bottle was removed from the patient's sight at any time. Once count was completed pills were immediately returned to the patient in their original bottle.  Medication: See above Pill/Patch Cooxyunt: 2 of 30 pills/patches remain Pill/Patch Appearance: Markings consistent with prescribed medication Bottle Appearance: Standard pharmacy container. Clearly labeled. Filled Date: 4 / 7 / 2025 Last Medication intake:  TodaySafety precautions to be maintained throughout the outpatient stay will include: orient to surroundings, keep bed in low position, maintain call bell within reach at all times, provide assistance with transfer out of bed and ambulation.     No results found for: "CBDTHCR" No results found for: "D8THCCBX" No results found for: "D9THCCBX"  UDS:  Summary  Date Value Ref Range Status  07/30/2023 Note  Final    Comment:    ==================================================================== [employer] (MW) ==================================================================== Test                             Result       Flag       Units  Drug Present and Declared for Prescription Verification   Noroxycodone                   193           EXPECTED   ng/mg creat    Noroxycodone is an expected metabolite of oxycodone . Sources of    oxycodone  include scheduled prescription medications.  Drug Absent but Declared for Prescription Verification   Oxycodone                       Not Detected UNEXPECTED ng/mg creat    Oxycodone  is almost always present in patients taking this drug    consistently.  Absence of oxycodone  could be due to lapse of time    since the last dose or unusual pharmacokinetics (rapid metabolism).  ==================================================================== Test                      Result    Flag   Units      Ref Range   Creatinine              80               mg/dL      >=16 ==================================================================== Declared Medications:  The flagging and interpretation on this report are based on the  following declared medications.  Unexpected results may arise from  inaccuracies in  the declared medications.   **Note: The testing scope of this panel includes these medications:   Oxycodone  (Roxicodone )   **Note: The testing scope of this panel does not include the  following reported medications:   Acetaminophen  (Tylenol )  Fluoxetine  (Prozac )  Linaclotide  (Linzess )  Naloxone  (Narcan )  Ondansetron  (Zofran )  Quetiapine (Seroquel)  Scopolamine ==================================================================== For clinical consultation, please call [phone]. ====================================================================      ROS  Constitutional: Denies any fever or chills Gastrointestinal: No reported hemesis, hematochezia, vomiting, or acute GI distress Musculoskeletal: Mid upper lower back pain, neck pain, left lateral hip pain, tenderness Neurological: No reported episodes of acute onset apraxia, aphasia, dysarthria, agnosia, amnesia, paralysis, loss of coordination, or loss of consciousness  Medication Review  FLUoxetine , acetaminophen ,  albuterol, atorvastatin , cyanocobalamin , fluticasone, linaclotide , montelukast, naloxone , ondansetron , oxyCODONE , promethazine -dextromethorphan, scopolamine, and zolpidem   History Review  Allergy: Ms. Helbling is allergic to gabapentin . Drug: Ms. Strohm  reports no history of drug use. Alcohol:  reports current alcohol use of about 1.0 standard drink of alcohol per week. Tobacco:  reports that she has been smoking cigarettes. She has a 18.5 pack-year smoking history. She has never used smokeless tobacco. Social: Ms. Mackel  reports that she has been smoking cigarettes. She has a 18.5 pack-year smoking history. She has never used smokeless tobacco. She reports current alcohol use of about 1.0 standard drink of alcohol per week. She reports that she does not use drugs. Medical:  has a past medical history of Anxiety, Chronic back pain, Degenerative joint disease, Depression, Headache, Hypotension, IBS (irritable bowel syndrome) (05/2015), MVP (mitral valve prolapse), and Nausea and vomiting. Surgical: Ms. Sheaffer  has a past surgical history that includes Cesarean section; Ankle surgery (Right, 03/02/2014); Hemorrhoid surgery (N/A, 06/16/2015); Cervical spine surgery (02/21/2016); Cesarean section with bilateral tubal ligation (1990); Colonoscopy (04/19/2015); Hysteroscopy with D & C (12/24/2013); Colonoscopy with propofol  (N/A, 05/18/2018); Esophagogastroduodenoscopy (egd) with propofol  (N/A, 05/18/2018); Spinal cord stimulator insertion (N/A, 06/18/2018); Tubal ligation; Lumbar spinal cord simulator revision (N/A, 03/14/2020); Colonoscopy with propofol  (N/A, 06/25/2023); Colonoscopy with propofol  (N/A, 08/07/2023); and polypectomy (08/07/2023). Family: family history includes Arthritis in her mother; Congestive Heart Failure in her maternal grandfather; Depression in her mother; Diabetes in her maternal aunt; Diabetes Mellitus I in her maternal grandmother; Gout in her maternal aunt; Hearing loss in her  father; Heart attack in her maternal grandfather; Hyperlipidemia in her father and mother; Hypertension in her father and mother; Hypothyroidism in her mother; Kidney disease in her mother; Prostate cancer in her paternal grandfather; Stroke in her maternal grandmother; Thyroid  disease in her maternal aunt; Vision loss in her mother.  Laboratory Chemistry Profile   Renal Lab Results  Component Value Date   BUN 12 02/01/2021   CREATININE 0.85 02/01/2021   BCR 8 (L) 09/08/2017   GFR 77.34 02/01/2021   GFRAA 93 09/08/2017   GFRNONAA 81 09/08/2017    Hepatic Lab Results  Component Value Date   AST 15 02/01/2021   ALT 11 02/01/2021   ALBUMIN 4.4 02/01/2021   ALKPHOS 59 02/01/2021    Electrolytes Lab Results  Component Value Date   NA 139 02/01/2021   K 4.2 02/01/2021   CL 105 02/01/2021   CALCIUM  9.3 02/01/2021   MG 2.1 09/08/2017    Bone Lab Results  Component Value Date   VD25OH 43.74 01/10/2016   25OHVITD1 51 09/08/2017   25OHVITD2 <1.0 09/08/2017   25OHVITD3 51 09/08/2017    Inflammation (CRP: Acute Phase) (ESR: Chronic Phase) Lab Results  Component  Value Date   CRP 1.4 09/08/2017   ESRSEDRATE 11 09/08/2017         Note: Above Lab results reviewed.  Recent Imaging Review  DG PAIN CLINIC C-ARM 1-60 MIN NO REPORT Fluoro was used, but no Radiologist interpretation will be provided.  Please refer to "NOTES" tab for provider progress note. Note: Reviewed         Physical Exam  General appearance: Well nourished, well developed, and well hydrated. In no apparent acute distress Mental status: Alert, oriented x 3 (person, place, & time)       Respiratory: No evidence of acute respiratory distress Eyes: PERLA Vitals: BP 126/73   Pulse 81   Temp 98 F (36.7 C)   Resp 16   Ht 5' (1.524 m)   Wt 145 lb (65.8 kg)   LMP 05/05/2017 (Exact Date)   SpO2 100%   PF (!) 6 L/min   BMI 28.32 kg/m  BMI: Estimated body mass index is 28.32 kg/m as calculated from the  following:   Height as of this encounter: 5' (1.524 m).   Weight as of this encounter: 145 lb (65.8 kg). Ideal: Ideal body weight: 45.5 kg (100 lb 4.9 oz) Adjusted ideal body weight: 53.6 kg (118 lb 3 oz)  Musculoskeletal:  + Left lateral hip pain and tenderness worse with touch  Assessment   Diagnosis Status  1. Trochanteric bursitis (Left)   2. Lumbar facet syndrome (Bilateral) (L>R)   3. 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 Chronic lower extremity pain (2ry area of Pain) (Bilateral) (R>L)   5. Chronic hip pain (3ry area of Pain) (Bilateral) (R>L)   6. Chronic neck pain (4th area of Pain) (Bilateral) (R>L)   7. Chronic occipital neuralgia (5th area of Pain) (Bilateral) (R>L)   8. Chronic pain syndrome   9. Pharmacologic therapy   10. Chronic use of opiate for therapeutic purpose   11. Encounter for medication management   12. Encounter for chronic pain management    Having a Flare-up Controlled Controlled   Updated Problems: No problems updated.  Plan of Care  Problem-specific:  Assessment and Plan  We will continue on current medication regimen.  Prescribed drug monitoring (PDMP) reviewed; findings consistent with use of prescribed medication and no evidence of narcotic misuse or abuse.  Urine drug screening (UDS) up-to-date.  Schedule follow-up in 90 days with Felesha Moncrieffe, NP for med management.  Patient scheduled for hip injection with Dr. Naveira.   Ms. SHENEE WIGNALL has a current medication list which includes the following long-term medication(s): albuterol, fluoxetine , fluticasone, montelukast, linaclotide , oxycodone , [START ON 04/23/2024] oxycodone , and [START ON 05/23/2024] oxycodone .  Pharmacotherapy (Medications Ordered): Meds ordered this encounter  Medications   oxyCODONE  (OXY IR/ROXICODONE ) 5 MG immediate release tablet    Sig: Take 1 tablet (5 mg total) by mouth daily. Must last 90 days.    Dispense:  30  tablet    Refill:  0    DO NOT: delete (not duplicate); no partial-fill (will deny script to complete), no refill request (F/U required). DISPENSE: 1 day early if closed on fill date. WARN: No CNS-depressants within 8 hrs of med.   oxyCODONE  (OXY IR/ROXICODONE ) 5 MG immediate release tablet    Sig: Take 1 tablet (5 mg total) by mouth daily. Must last 90 days.    Dispense:  30 tablet    Refill:  0    DO NOT: delete (not  duplicate); no partial-fill (will deny script to complete), no refill request (F/U required). DISPENSE: 1 day early if closed on fill date. WARN: No CNS-depressants within 8 hrs of med.   oxyCODONE  (OXY IR/ROXICODONE ) 5 MG immediate release tablet    Sig: Take 1 tablet (5 mg total) by mouth daily. Must last 90 days.    Dispense:  30 tablet    Refill:  0    DO NOT: delete (not duplicate); no partial-fill (will deny script to complete), no refill request (F/U required). DISPENSE: 1 day early if closed on fill date. WARN: No CNS-depressants within 8 hrs of med.   Orders:  Orders Placed This Encounter  Procedures   HIP INJECTION    Standing Status:   Future    Expiration Date:   06/24/2024    Scheduling Instructions:     Procedure: Hip Bursa injection     Purpose: Therapeutic/Diagnostic     Indication: Hip pain 2ry to Trochanteric Burlitis left (M70.62).     Side: Left-sided     Sedation: Patient's choice.     Timeframe: As soon as the schedule permits.        Return in about 3 months (around 06/24/2024) for (F2F), (MM), Marthe Slain NP.    Recent Visits Date Type Provider Dept  03/11/24 Procedure visit Renaldo Caroli, MD [facility]  02/09/24 Office Visit Renaldo Caroli, MD [facility]  02/02/24 Office Visit Renaldo Caroli, MD [facility]  Showing recent visits within past 90 days and meeting all other requirements Today's Visits Date Type Provider Dept  03/24/24 Office Visit Kemyah Buser K, NP [facility]  Showing  today's visits and meeting all other requirements Future Appointments Date Type Provider Dept  04/07/24 Appointment Renaldo Caroli, MD [facility]  04/22/24 Appointment Renaldo Caroli, MD [facility]  Showing future appointments within next 90 days and meeting all other requirements  I discussed the assessment and treatment plan with the patient. The patient was provided an opportunity to ask questions and all were answered. The patient agreed with the plan and demonstrated an understanding of the instructions.  Patient advised to call back or seek an in-person evaluation if the symptoms or condition worsens.  Duration of encounter: 30 Ms. Fadden minutes.  Total time on encounter, as per AMA guidelines included both the face-to-face and non-face-to-face time personally spent by the physician and/or other qualified health care professional(s) on the day of the encounter (includes time in activities that require the physician or other qualified health care professional and does not include time in activities normally performed by clinical staff). Physician's time may include the following activities when performed: Preparing to see the patient (e.g., pre-charting review of records, searching for previously ordered imaging, lab work, and nerve conduction tests) Review of prior analgesic pharmacotherapies. Reviewing PMP Interpreting ordered tests (e.g., lab work, imaging, nerve conduction tests) Performing post-procedure evaluations, including interpretation of diagnostic procedures Obtaining and/or reviewing separately obtained history Performing a medically appropriate examination and/or evaluation Counseling and educating the patient/family/caregiver Ordering medications, tests, or procedures Referring and communicating with other health care professionals (when not separately reported) Documenting clinical information in the electronic or other health  record Independently interpreting results (not separately reported) and communicating results to the patient/ family/caregiver Care coordination (not separately reported)  Note by: Chaske Paskett K Cullin Dishman, NP (TTS and AI technology used. I apologize for any typographical errors that were not detected and corrected.) Date: 03/24/2024; Time: 3:41 PM

## 2024-04-06 NOTE — Progress Notes (Unsigned)
 PROVIDER NOTE: Interpretation of information contained herein should be left to medically-trained personnel. Specific patient instructions are provided elsewhere under Patient Instructions section of medical record. This document was created in part using AI and STT-dictation technology, any transcriptional errors that may result from this process are unintentional.  Patient: Karen Edwards  Service: E/M   PCP: Athena Bland, FNP  DOB: July 16, 1966  DOS: 04/07/2024  Provider: Candi Chafe, MD  MRN: [id number]  Delivery: Face-to-face  Specialty: Interventional Pain Management  Type: Established Patient  Setting: Ambulatory outpatient facility  Specialty designation: 09  Referring Prov.: Athena Bland, FNP  Location: Outpatient office facility       History of present illness (HPI) [name], a 58 y.o. year old female, is here today because of her Chronic neck pain [M54.2, G89.29]. Karen Edwards primary complain today is Neck Pain  Pertinent problems: Karen Edwards has Chronic low back pain (1ry area of Pain) (Bilateral) (R>L) w/ sciatica (Bilateral); DDD (degenerative disc disease), lumbar; DDD (degenerative disc disease), cervical; Chronic occipital neuralgia (5th area of Pain) (Bilateral) (R>L); Lumbar facet syndrome (Bilateral) (L>R); DDD (degenerative disc disease), lumbosacral; Lumbar radicular pain (Right) (L4); Sacroiliac joint dysfunction (Bilateral); Chronic neck pain (4th area of Pain) (Bilateral) (R>L); Numbness and tingling of upper extremity (Right); Chronic upper extremity pain (Bilateral) (R>L); Chronic sacroiliac joint pain (Bilateral) (L>R); Lumbar facet hypertrophy (Bilateral); L1-2 disc extrusion (Right); Cervical foraminal stenosis (C5-6) (Bilateral); Chronic pain syndrome; Chronic cervical radicular pain (Right: C6/C7) (Left: C5/T1); Chronic lumbar radicular pain; Chronic hip pain (3ry area of Pain) (Bilateral) (R>L); Chronic lower extremity pain (2ry area  of Pain) (Bilateral) (R>L); Chronic shoulder pain (Bilateral) (L>R); Chronic musculoskeletal pain; Lumbar spondylosis; Cervical facet syndrome (Bilateral) (R>L); Myofascial pain; Spondylosis without myelopathy or radiculopathy, cervical region; Cervicalgia; S/P insertion of Epidural Neurostimulator (SCS) (Bilateral); Chronic neck pain with history of cervical spinal surgery; 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[facility]); Trigger point with back pain (Right); Cervicogenic headache (Bilateral); Occipital headache (Bilateral); Chronic tension-type headache, intractable; Cervico-occipital neuralgia (Bilateral); Pain due to any device, implant or graft (SCS battery) (Left PSIS area); Spondylosis without myelopathy or radiculopathy, lumbosacral region; History of cervical spinal surgery (C5-7 ACDF); Cervical radiculopathy at C6 (Bilateral); Chronic low back pain (Bilateral) w/o sciatica; Presence of neurostimulator (Thoracolumbar); Spinal cord stimulator dysfunction (HCC); Neurogenic pain; Chronic low back pain (Left) w/o sciatica; Cluneal neuropathy (Left); Abnormal MRI, lumbar spine (04/17/2021); Enthesopathy of hip region (Bilateral); Protrusion of lumbar intervertebral disc (Right: L1-2, L3-4); 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  Pain Assessment: Severity of Chronic pain is reported as a 6 /10. Location: Neck Right, Left, Lower/side of neck to shoulders bilateral, radiates up back of head. Onset: More than a month ago. Quality: Aching, Constant, Discomfort, Burning, Pins and needles, Headache. Timing: Constant. Modifying factor(s): rest, ROM, medication. Vitals:  height is 5' (1.524 m) and weight  is 148 lb (67.1 kg). Her temperature is 98.1 F (36.7 C). Her blood pressure is 122/81 and her pulse is 85. Her respiration is 16 and oxygen saturation is 100%.  BMI: Estimated body mass index is 28.9 kg/m as calculated from the following:   Height as of this encounter: 5' (1.524 m).   Weight as of this encounter: 148 lb (67.1 kg).  Last encounter: 02/09/2024. Last procedure: 03/11/2024.  Reason for encounter: post-procedure evaluation and assessment.   Discussed the use of AI scribe software for clinical note transcription with the patient, who gave verbal consent to proceed.  History of Present Illness   Karen Edwards is a 58 year old female who presents with neck pain and headaches following a cervical epidural.  She experienced numbness for about a week after the cervical epidural, during which she did not have any pain. Once the numbness subsided, the pain gradually returned, starting with an aching sensation in the neck.  The pain is primarily aching in the neck without significant radiation down the arms, although there is an occasional burning sensation down the arm. The pain is more pronounced on the right side but can vary, with one side being worse than the other on different days. Neck movement influences the pain, often leading to headaches.  She underwent radiofrequency treatment on the right side in 2019 and on the left side in 2020, which provided long-term relief at the time. Currently, she is experiencing a recurrence of symptoms, with the right side of her neck being the most painful and headaches more prominent on the left side.  No current numbness or weakness in the arms. She is not taking any blood thinners.     Post-Procedure Evaluation   Procedure: Cervical Epidural Steroid injection (CESI) (Interlaminar) #3  Laterality: Right  Level: C7-T1 Imaging: Fluoroscopy-assisted DOS: 03/11/2024  Performed by: Renaldo Caroli, MD Anesthesia: Local anesthesia (1-2%  Lidocaine ) Anxiolysis: IV Versed          Sedation: Moderate Sedation                         Purpose: Diagnostic/Therapeutic Indications: Cervicalgia, cervical radicular pain, degenerative disc disease, severe enough to impact quality of life or function. 1. Cervical radiculopathy at C6 (Bilateral)   2. Cervical foraminal stenosis (C5-6) (Bilateral)   3. Cervicalgia   4. Cervico-occipital neuralgia (Bilateral)   5. Cervicogenic headache (Bilateral)   6. Chronic cervical radicular pain (Right: C6/C7) (Left: C5/T1)   7. Chronic neck pain (4th area of Pain) (Bilateral) (R>L)   8. Chronic neck pain with history of cervical spinal surgery   9. DDD (degenerative disc disease), cervical    NAS-11 score:   Pre-procedure: 7 /10   Post-procedure: 7 /10     Effectiveness:  Initial hour after procedure: 100 %. Subsequent 4-6 hours post-procedure: 100 %. Analgesia past initial 6 hours: 45 % (current). Ongoing improvement:  Analgesic: The patient indicates having attained 100% relief of the pain for the duration of the local anesthetic followed by an ongoing 100% relief of the pain in the neck area and upper extremities for an additional 7 days.  After that the pain has began to return in the area of the neck, but not the upper extremities.  She continues to enjoy 100% ongoing relief of the cervical radicular symptoms with an ongoing 45% relief of the axial pain in the cervical region. Function: Karen Edwards reports improvement in function ROM: Karen Edwards reports improvement in ROM   Pharmacotherapy Assessment   Analgesic: Oxycodone  IR 5 mg, 1 tab p.o. twice daily PRN (#20/90 days).  MME/day: 0 mg/day.   Monitoring: [place] PMP: PDMP reviewed during this encounter.       Pharmacotherapy: No side-effects or adverse reactions reported. Compliance: No problems identified. Effectiveness: Clinically acceptable.  Karen Drafts, RN  04/07/2024 10:37 AM  Sign when  Signing Visit Safety precautions  to be maintained throughout the outpatient stay will include: orient to surroundings, keep bed in low position, maintain call bell within reach at all times, provide assistance with transfer out of bed and ambulation.     UDS:  Summary  Date Value Ref Range Status  07/30/2023 Note  Final    Comment:    ==================================================================== [employer] 13 (MW) ==================================================================== Test                             Result       Flag       Units  Drug Present and Declared for Prescription Verification   Noroxycodone                   193          EXPECTED   ng/mg creat    Noroxycodone is an expected metabolite of oxycodone . Sources of    oxycodone  include scheduled prescription medications.  Drug Absent but Declared for Prescription Verification   Oxycodone                       Not Detected UNEXPECTED ng/mg creat    Oxycodone  is almost always present in patients taking this drug    consistently.  Absence of oxycodone  could be due to lapse of time    since the last dose or unusual pharmacokinetics (rapid metabolism).  ==================================================================== Test                      Result    Flag   Units      Ref Range   Creatinine              80               mg/dL      >=16 ==================================================================== Declared Medications:  The flagging and interpretation on this report are based on the  following declared medications.  Unexpected results may arise from  inaccuracies in the declared medications.   **Note: The testing scope of this panel includes these medications:   Oxycodone  (Roxicodone )   **Note: The testing scope of this panel does not include the  following reported medications:   Acetaminophen  (Tylenol )  Fluoxetine  (Prozac )  Linaclotide  (Linzess )  Naloxone  (Narcan )  Ondansetron  (Zofran )  Quetiapine (Seroquel)   Scopolamine ==================================================================== For clinical consultation, please call [phone]. ====================================================================     No results found for: CBDTHCR No results found for: D8THCCBX No results found for: D9THCCBX  ROS  Constitutional: Denies any fever or chills Gastrointestinal: No reported hemesis, hematochezia, vomiting, or acute GI distress Musculoskeletal: Denies any acute onset joint swelling, redness, loss of ROM, or weakness Neurological: No reported episodes of acute onset apraxia, aphasia, dysarthria, agnosia, amnesia, paralysis, loss of coordination, or loss of consciousness  Medication Review  FLUoxetine , acetaminophen , albuterol, atorvastatin , cyanocobalamin , fluticasone, linaclotide , montelukast, naloxone , ondansetron , oxyCODONE , promethazine -dextromethorphan, scopolamine, and zolpidem   History Review  Allergy: Karen Edwards is allergic to gabapentin . Drug: Karen Edwards  reports no history of drug use. Alcohol:  reports current alcohol use of about 1.0 standard drink of alcohol per week. Tobacco:  reports that she has been smoking cigarettes. She has a 18.5 pack-year smoking history. She has never used smokeless tobacco. Social: Karen Edwards  reports that she has been smoking cigarettes. She has a 18.5 pack-year smoking history. She has never used smokeless tobacco. She  reports current alcohol use of about 1.0 standard drink of alcohol per week. She reports that she does not use drugs. Medical:  has a past medical history of Anxiety, Chronic back pain, Degenerative joint disease, Depression, Headache, Hypotension, IBS (irritable bowel syndrome) (05/2015), MVP (mitral valve prolapse), and Nausea and vomiting. Surgical: Ms. Lacosse  has a past surgical history that includes Cesarean section; Ankle surgery (Right, 03/02/2014); Hemorrhoid surgery (N/A, 06/16/2015); Cervical spine surgery  (02/21/2016); Cesarean section with bilateral tubal ligation (1990); Colonoscopy (04/19/2015); Hysteroscopy with D & C (12/24/2013); Colonoscopy with propofol  (N/A, 05/18/2018); Esophagogastroduodenoscopy (egd) with propofol  (N/A, 05/18/2018); Spinal cord stimulator insertion (N/A, 06/18/2018); Tubal ligation; Lumbar spinal cord simulator revision (N/A, 03/14/2020); Colonoscopy with propofol  (N/A, 06/25/2023); Colonoscopy with propofol  (N/A, 08/07/2023); and polypectomy (08/07/2023). Family: family history includes Arthritis in her mother; Congestive Heart Failure in her maternal grandfather; Depression in her mother; Diabetes in her maternal aunt; Diabetes Mellitus I in her maternal grandmother; Gout in her maternal aunt; Hearing loss in her father; Heart attack in her maternal grandfather; Hyperlipidemia in her father and mother; Hypertension in her father and mother; Hypothyroidism in her mother; Kidney disease in her mother; Prostate cancer in her paternal grandfather; Stroke in her maternal grandmother; Thyroid  disease in her maternal aunt; Vision loss in her mother.  Laboratory Chemistry Profile   Renal Lab Results  Component Value Date   BUN 12 02/01/2021   CREATININE 0.85 02/01/2021   BCR 8 (L) 09/08/2017   GFR 77.34 02/01/2021   GFRAA 93 09/08/2017   GFRNONAA 81 09/08/2017    Hepatic Lab Results  Component Value Date   AST 15 02/01/2021   ALT 11 02/01/2021   ALBUMIN 4.4 02/01/2021   ALKPHOS 59 02/01/2021    Electrolytes Lab Results  Component Value Date   NA 139 02/01/2021   K 4.2 02/01/2021   CL 105 02/01/2021   CALCIUM  9.3 02/01/2021   MG 2.1 09/08/2017    Bone Lab Results  Component Value Date   VD25OH 43.74 01/10/2016   25OHVITD1 51 09/08/2017   25OHVITD2 <1.0 09/08/2017   25OHVITD3 51 09/08/2017    Inflammation (CRP: Acute Phase) (ESR: Chronic Phase) Lab Results  Component Value Date   CRP 1.4 09/08/2017   ESRSEDRATE 11 09/08/2017         Note: Above Lab results  reviewed.  Recent Imaging Review  DG PAIN CLINIC C-ARM 1-60 MIN NO REPORT Fluoro was used, but no Radiologist interpretation will be provided.  Please refer to NOTES tab for provider progress note. Note: Reviewed         Physical Exam  General appearance: Well nourished, well developed, and well hydrated. In no apparent acute distress Mental status: Alert, oriented x 3 (person, place, & time)       Respiratory: No evidence of acute respiratory distress Eyes: PERLA Vitals: BP 122/81   Pulse 85   Temp 98.1 F (36.7 C)   Resp 16   Ht 5' (1.524 m)   Wt 148 lb (67.1 kg)   LMP 05/05/2017 (Exact Date)   SpO2 100%   BMI 28.90 kg/m  BMI: Estimated body mass index is 28.9 kg/m as calculated from the following:   Height as of this encounter: 5' (1.524 m).   Weight as of this encounter: 148 lb (67.1 kg). Ideal: Ideal body weight: 45.5 kg (100 lb 4.9 oz) Adjusted ideal body weight: 54.2 kg (119 lb 6.2 oz)  Assessment   Diagnosis Status  1. Chronic neck pain (4th area  of Pain) (Bilateral) (R>L)   2. Cervicalgia   3. Cervical radiculopathy at C6 (Bilateral)   4. Cervicogenic headache (Bilateral)   5. Chronic cervical radicular pain (Right: C6/C7) (Left: C5/T1)   6. Abnormal MRI, lumbar spine (04/17/2021)   7. Postop check   8. Cervical facet syndrome (Bilateral) (R>L)   9. DDD (degenerative disc disease), cervical    Controlled Controlled Controlled   Updated Problems: No problems updated.  Plan of Care  Problem-specific:  Assessment and Plan    Cervical facet joint pain   Chronic cervical facet joint pain presents with right-sided neck aching and occasional arm burning, worsened by neck movement and accompanied by headaches. Previous radiofrequency ablation provided 85% relief on the right and 50% on the left, with the last procedures in 2019 and 2020, suggesting the need for repeat intervention. Order a CT of the cervical spine to evaluate facet joint pathology. Plan  radiofrequency ablation on the right cervical spine, targeting C4 through C7, pending CT results and approval. Schedule left side ablation two weeks after the right side intervention, pending approval.  Headache   Headaches are associated with cervical facet joint pain, mainly on the left side, likely due to upper facet joint pathology. Manage headaches in conjunction with cervical facet joint pain treatment.       Karen Edwards has a current medication list which includes the following long-term medication(s): albuterol, fluoxetine , fluticasone, montelukast, oxycodone , [START ON 04/23/2024] oxycodone , [START ON 05/23/2024] oxycodone , and linaclotide .  Pharmacotherapy (Medications Ordered): No orders of the defined types were placed in this encounter.  Orders:  Orders Placed This Encounter  Procedures   Radiofrequency,Cervical    Standing Status:   Future    Expiration Date:   08/07/2024    Scheduling Instructions:     Side(s): Right-sided     Level(s): C4, C5, C6, and C7 Medial Branch Nerves     Sedation: With Sedation.     Timeframe: Pending approval.    Where will this procedure be performed?:   [facility]   CT CERVICAL SPINE WO CONTRAST    Patient presents with axial pain with possible radicular component. Please assist us  in identifying specific level(s) and laterality of any additional findings such as: 1. Facet (Zygapophyseal) joint DJD (Hypertrophy, space narrowing, subchondral sclerosis, and/or osteophyte formation) 2. DDD and/or IVDD (Loss of disc height, desiccation, gas patterns, osteophytes, endplate sclerosis, or Black disc disease) 3. Pars defects 4. Spondylolisthesis, spondylosis, and/or spondyloarthropathies (include Degree/Grade of displacement in mm) (stability) 5. Vertebral body Fractures (acute/chronic) (state percentage of collapse) 6. Demineralization (osteopenia/osteoporotic) 7. Bone pathology 8. Foraminal narrowing  9. Surgical changes 10.  Central, Lateral Recess, and/or Foraminal Stenosis (include AP diameter of stenosis in mm) 11. Surgical changes (hardware type, status, and presence of fibrosis) 12. Modic Type Changes (MRI only) 13. IVDD (Disc bulge, protrusion, herniation, extrusion) (Level, laterality, extent)  Medical necessity: Imaging study necessary to evaluate for possible degenerative disease responsible for neurologic dysfunction that may be caused by compression or inflammation of CNS and to help plan for interventional therapy or surgery, such as decompression of a pinched nerve or spinal fusion.    Standing Status:   Future    Expiration Date:   07/08/2024    Scheduling Instructions:     Please make sure that the patient understands that this needs to be done as soon as possible. Never have the patient do the imaging just before the next appointment. Inform patient that having the imaging  done within the [facility] will expedite the availability of the results and will provide      imaging availability to the requesting physician. In addition inform the patient that the imaging order has an expiration date and will not be renewed if not done within the active period.    Is patient pregnant?:   No    Preferred imaging location?:   [facility]    Call Results- Best Contact Number?:   [phone] [facility]    Radiology Contrast Protocol - do NOT remove file path:   [email or web address]    Release to patient:   Immediate [1]   Nursing Instructions:    Please complete this patient's postprocedure evaluation.    Scheduling Instructions:     Please complete this patient's postprocedure evaluation.     Interventional Therapies  Risk Factors  Considerations:   Mitral valve prolapse (MVP) GERD  urinary stress incontinence  GAD   Planned  Pending:   Diagnostic CT of the cervical spine  Therapeutic bilateral cervical facet  RFA #2 (starting with the right side)    Under consideration:   Therapeutic bilateral cervical facet RFA #2  Therapeutic right L1-2 & L3-4 percutaneous discectomy ([employer]ryker compressor)  Therapeutic intrathecal pump trial    Completed:   Therapeutic right cervical ESI x3 (03/11/2024) (100/100/100 x 7 days/100% for radicular  45% for axial)  Diagnostic bilateral GONB x1 (08/19/2019) (100/100/100/,50)  Palliative right cervical facet MBB x3 (07/23/2018) (100/100/100/>50)  Palliative left cervical facet MBB x4 (04/01/2019) (90/90/0/0)  Palliative right cervical facet RFA x1 (09/17/2018) (100/100/85/85)  Palliative left cervical facet RFA x1 (05/04/2019) (100/100/50/50)  Therapeutic left L1-2 LESI x1 (05/10/2021) (100/100/100 x 2 days/0)  Therapeutic right L1-2 LESI x2 (02/10/2018) (100/100/70/50-75)  Therapeutic right L1 TFESI x1 (02/10/2018) (100/100/70/50-75)  Diagnostic/therapeutic left superior cluneal NB (L2, L3 dorsal rami) x1 (04/24/2021) (0/0/0/0)  Diagnostic/therapeutic left L1 TFESI x2 (11/15/2021) (100/100/0)  Diagnostic/therapeutic left L3 TFESI x2 (11/15/2021) (100/100/0)  Bilateral lumbar spinal cord stimulator trial (done) (05/14/2018) (by me)  Bilateral lumbar spinal cord stimulator implant (09/28/2019) (by me)  Bilateral spinal cord stimulator revision (03/14/2020) (by me)  Therapeutic bilateral lumbar facet MBB R5L6 (12/02/2023) (100/100/80/80)  Diagnostic bilateral SI joint Blk x2 (09/18/2021) (100/100/100)  Palliative left lumbar facet RFA x3 (06/27/2022) (100/100/100/100)  Therapeutic right lumbar facet RFA x3 (05/21/2022) (100/100/75/75)    Therapeutic  Palliative (PRN) options:   Palliative right cervical facet RFA   Diagnostic bilateral SI joint Blk   Therapeutic bilateral lumbar facet RFA    Pharmacotherapy  Nonopioids transfer 08/23/2020: Lidocaine  5% ointment and Robaxin       Return for ( ), (ECT): (R) C-FCT RFA #2.    Recent Visits Date Type Provider Dept   03/24/24 Office Visit Patel, Seema K, NP [facility]  03/11/24 Procedure visit Renaldo Caroli, MD [facility]  02/09/24 Office Visit Renaldo Caroli, MD [facility]  02/02/24 Office Visit Renaldo Caroli, MD [facility]  Showing recent visits within past 90 days and meeting all other requirements Today's Visits Date Type Provider Dept  04/07/24 Office Visit Renaldo Caroli, MD [facility]  Showing today's visits and meeting all other requirements Future Appointments Date Type Provider Dept  04/22/24 Appointment Renaldo Caroli, MD [facility]  06/24/24 Appointment Patel, Seema K, NP [facility]  Showing future appointments within next 90 days and meeting all other requirements  I discussed the assessment and treatment plan with the  patient. The patient was provided an opportunity to ask questions and all were answered. The patient agreed with the plan and demonstrated an understanding of the instructions.  Patient advised to call back or seek an in-person evaluation if the symptoms or condition worsens.  Duration of encounter: 30 minutes.  Total time on encounter, as per AMA guidelines included both the face-to-face and non-face-to-face time personally spent by the physician and/or other qualified health care professional(s) on the day of the encounter (includes time in activities that require the physician or other qualified health care professional and does not include time in activities normally performed by clinical staff). Physician's time may include the following activities when performed: Preparing to see the patient (e.g., pre-charting review of records, searching for previously ordered imaging, lab work, and nerve conduction tests) Review of prior analgesic pharmacotherapies. Reviewing PMP Interpreting ordered tests (e.g., lab work, imaging, nerve conduction tests) Performing post-procedure  evaluations, including interpretation of diagnostic procedures Obtaining and/or reviewing separately obtained history Performing a medically appropriate examination and/or evaluation Counseling and educating the patient/family/caregiver Ordering medications, tests, or procedures Referring and communicating with other health care professionals (when not separately reported) Documenting clinical information in the electronic or other health record Independently interpreting results (not separately reported) and communicating results to the patient/ family/caregiver Care coordination (not separately reported)  Note by: Candi Chafe, MD (TTS and AI technology used. I apologize for any typographical errors that were not detected and corrected.) Date: 04/07/2024; Time: 12:51 PM

## 2024-04-07 ENCOUNTER — Ambulatory Visit: Attending: Pain Medicine | Admitting: Pain Medicine

## 2024-04-07 ENCOUNTER — Encounter: Payer: Self-pay | Admitting: Pain Medicine

## 2024-04-07 VITALS — BP 122/81 | HR 85 | Temp 98.1°F | Resp 16 | Ht 60.0 in | Wt 148.0 lb

## 2024-04-07 DIAGNOSIS — Z09 Encounter for follow-up examination after completed treatment for conditions other than malignant neoplasm: Secondary | ICD-10-CM | POA: Insufficient documentation

## 2024-04-07 DIAGNOSIS — R937 Abnormal findings on diagnostic imaging of other parts of musculoskeletal system: Secondary | ICD-10-CM | POA: Insufficient documentation

## 2024-04-07 DIAGNOSIS — G4486 Cervicogenic headache: Secondary | ICD-10-CM | POA: Insufficient documentation

## 2024-04-07 DIAGNOSIS — M47812 Spondylosis without myelopathy or radiculopathy, cervical region: Secondary | ICD-10-CM | POA: Diagnosis not present

## 2024-04-07 DIAGNOSIS — M542 Cervicalgia: Secondary | ICD-10-CM | POA: Insufficient documentation

## 2024-04-07 DIAGNOSIS — G8929 Other chronic pain: Secondary | ICD-10-CM | POA: Insufficient documentation

## 2024-04-07 DIAGNOSIS — M5030 Other cervical disc degeneration, unspecified cervical region: Secondary | ICD-10-CM | POA: Insufficient documentation

## 2024-04-07 DIAGNOSIS — M5412 Radiculopathy, cervical region: Secondary | ICD-10-CM | POA: Diagnosis present

## 2024-04-07 NOTE — Patient Instructions (Signed)

## 2024-04-07 NOTE — Progress Notes (Signed)
 Safety precautions to be maintained throughout the outpatient stay will include: orient to surroundings, keep bed in low position, maintain call bell within reach at all times, provide assistance with transfer out of bed and ambulation.

## 2024-04-12 DIAGNOSIS — J329 Chronic sinusitis, unspecified: Secondary | ICD-10-CM | POA: Insufficient documentation

## 2024-04-12 DIAGNOSIS — H6993 Unspecified Eustachian tube disorder, bilateral: Secondary | ICD-10-CM | POA: Insufficient documentation

## 2024-04-12 DIAGNOSIS — H9203 Otalgia, bilateral: Secondary | ICD-10-CM | POA: Insufficient documentation

## 2024-04-16 ENCOUNTER — Ambulatory Visit
Admission: RE | Admit: 2024-04-16 | Discharge: 2024-04-16 | Disposition: A | Discharge status: Home / Self Care | Source: Ambulatory Visit | Attending: Pain Medicine | Admitting: Pain Medicine

## 2024-04-16 DIAGNOSIS — M542 Cervicalgia: Secondary | ICD-10-CM | POA: Diagnosis present

## 2024-04-16 DIAGNOSIS — M5030 Other cervical disc degeneration, unspecified cervical region: Secondary | ICD-10-CM | POA: Insufficient documentation

## 2024-04-16 DIAGNOSIS — M47812 Spondylosis without myelopathy or radiculopathy, cervical region: Secondary | ICD-10-CM | POA: Diagnosis present

## 2024-04-16 DIAGNOSIS — M5412 Radiculopathy, cervical region: Secondary | ICD-10-CM | POA: Insufficient documentation

## 2024-04-16 DIAGNOSIS — G4486 Cervicogenic headache: Secondary | ICD-10-CM | POA: Diagnosis present

## 2024-04-16 DIAGNOSIS — G8929 Other chronic pain: Secondary | ICD-10-CM | POA: Insufficient documentation

## 2024-04-21 ENCOUNTER — Telehealth: Payer: Self-pay

## 2024-04-21 NOTE — Telephone Encounter (Signed)
 You ordered a cervical RFA for her but she has not had diagnostic cervical facets. Insurance will not approve a RFA w/o diagnostic facets. Epidurals dont count. Do you want to order facets?

## 2024-04-22 ENCOUNTER — Encounter: Payer: Self-pay | Admitting: Pain Medicine

## 2024-04-22 ENCOUNTER — Ambulatory Visit (HOSPITAL_BASED_OUTPATIENT_CLINIC_OR_DEPARTMENT_OTHER): Admitting: Pain Medicine

## 2024-04-22 ENCOUNTER — Ambulatory Visit
Admission: RE | Admit: 2024-04-22 | Discharge: 2024-04-22 | Disposition: A | Discharge status: Home / Self Care | Source: Ambulatory Visit | Attending: Pain Medicine | Admitting: Pain Medicine

## 2024-04-22 VITALS — BP 129/76 | HR 69 | Temp 97.7°F | Resp 16 | Ht 60.0 in | Wt 146.0 lb

## 2024-04-22 DIAGNOSIS — M25552 Pain in left hip: Secondary | ICD-10-CM

## 2024-04-22 DIAGNOSIS — M25551 Pain in right hip: Secondary | ICD-10-CM | POA: Diagnosis present

## 2024-04-22 DIAGNOSIS — M7062 Trochanteric bursitis, left hip: Secondary | ICD-10-CM | POA: Insufficient documentation

## 2024-04-22 DIAGNOSIS — M76892 Other specified enthesopathies of left lower limb, excluding foot: Secondary | ICD-10-CM

## 2024-04-22 DIAGNOSIS — G8929 Other chronic pain: Secondary | ICD-10-CM

## 2024-04-22 DIAGNOSIS — M76891 Other specified enthesopathies of right lower limb, excluding foot: Secondary | ICD-10-CM | POA: Diagnosis present

## 2024-04-22 MED ORDER — PENTAFLUOROPROP-TETRAFLUOROETH EX AERO
INHALATION_SPRAY | Freq: Once | CUTANEOUS | Status: AC
  Administered 2024-04-22: 30 via TOPICAL

## 2024-04-22 MED ORDER — LIDOCAINE HCL 2 % IJ SOLN
20.0000 mL | Freq: Once | INTRAMUSCULAR | Status: AC
  Administered 2024-04-22: 100 mg
  Filled 2024-04-22: qty 40

## 2024-04-22 MED ORDER — ROPIVACAINE HCL 2 MG/ML IJ SOLN
9.0000 mL | Freq: Once | INTRAMUSCULAR | Status: AC
  Administered 2024-04-22: 9 mL via INTRA_ARTICULAR
  Filled 2024-04-22: qty 20

## 2024-04-22 MED ORDER — MIDAZOLAM HCL 2 MG/2ML IJ SOLN
0.5000 mg | Freq: Once | INTRAMUSCULAR | Status: AC
  Administered 2024-04-22: 2 mg via INTRAVENOUS
  Filled 2024-04-22: qty 2

## 2024-04-22 MED ORDER — METHYLPREDNISOLONE ACETATE 80 MG/ML IJ SUSP
80.0000 mg | Freq: Once | INTRAMUSCULAR | Status: AC
  Administered 2024-04-22: 80 mg via INTRA_ARTICULAR
  Filled 2024-04-22: qty 1

## 2024-04-22 NOTE — Patient Instructions (Signed)

## 2024-04-22 NOTE — Progress Notes (Signed)
 PROVIDER NOTE: Interpretation of information contained herein should be left to medically-trained personnel. Specific patient instructions are provided elsewhere under Patient Instructions section of medical record. This document was created in part using STT-dictation technology, any transcriptional errors that may result from this process are unintentional.  Patient: Karen Edwards Dimes Type: Established DOB: September 01, 1966 MRN: [id number] PCP: Arloa Almarie NOVAK, FNP  Service: Procedure DOS: 04/22/2024 Setting: Ambulatory Location: Ambulatory outpatient facility Delivery: Face-to-face Provider: Eric DELENA Como, MD Specialty: Interventional Pain Management Specialty designation: 09 Location: Outpatient facility Ref. Prov.: Arloa Almarie NOVAK, FNP       Interventional Therapy   Primary Reason for Visit: Interventional Pain Management Treatment. CC: Hip Pain (Left)    Procedure:          Anesthesia, Analgesia, Anxiolysis:  Type: Hip bursa injection #1  Primary Purpose: Diagnostic Region: Upper (proximal) Femoral Region Level: Hip Joint Target Area: Trochanteric Bursa Approach: posterolateral approach Laterality: Bilateral  Type: Local Anesthesia Local Anesthetic: Lidocaine  1-2% Sedation: Minimal Anxiolysis  Indication(s):  Analgesia Route: Infiltration ([place]/IM) IV Access: N/A   Position: Prone   1. Chronic hip pain (3ry area of Pain) (Bilateral) (R>L)   2. Enthesopathy of hip region (Bilateral)   3. Trochanteric bursitis (Left)    NAS-11 Pain score:   Pre-procedure: 6 /10   Post-procedure: 0-No pain/10     H&P (Pre-op Assessment):  Karen Edwards is a 58 y.o. (year old), female patient, seen today for interventional treatment. She  has a past surgical history that includes Cesarean section; Ankle surgery (Right, 03/02/2014); Hemorrhoid surgery (N/A, 06/16/2015); Cervical spine surgery (02/21/2016); Cesarean section with bilateral tubal ligation (1990); Colonoscopy  (04/19/2015); Hysteroscopy with D & C (12/24/2013); Colonoscopy with propofol  (N/A, 05/18/2018); Esophagogastroduodenoscopy (egd) with propofol  (N/A, 05/18/2018); Spinal cord stimulator insertion (N/A, 06/18/2018); Tubal ligation; Lumbar spinal cord simulator revision (N/A, 03/14/2020); Colonoscopy with propofol  (N/A, 06/25/2023); Colonoscopy with propofol  (N/A, 08/07/2023); and polypectomy (08/07/2023). Ms. Hehl has a current medication list which includes the following prescription(s): acetaminophen , albuterol, atorvastatin , cyanocobalamin , fluoxetine , fluticasone, linaclotide , montelukast, naloxone , ondansetron , oxycodone , [START ON 04/23/2024] oxycodone , [START ON 05/23/2024] oxycodone , promethazine -dextromethorphan, scopolamine, zolpidem , and zolpidem . Her primarily concern today is the Hip Pain (Left)  Initial Vital Signs:  Pulse/HCG Rate: 68ECG Heart Rate: 67 Temp: 97.7 F (36.5 C) Resp: 16 BP: (!) 141/68 SpO2: 100 %  BMI: Estimated body mass index is 28.51 kg/m as calculated from the following:   Height as of this encounter: 5' (1.524 m).   Weight as of this encounter: 146 lb (66.2 kg).  Risk Assessment: Allergies: Reviewed. She is allergic to gabapentin .  Allergy Precautions: None required Coagulopathies: Reviewed. None identified.  Blood-thinner therapy: None at this time Active Infection(s): Reviewed. None identified. Karen Edwards is afebrile  Site Confirmation: Karen Edwards was asked to confirm the procedure and laterality before marking the site Procedure checklist: Completed Consent: Before the procedure and under the influence of no sedative(s), amnesic(s), or anxiolytics, the patient was informed of the treatment options, risks and possible complications. To fulfill our ethical and legal obligations, as recommended by the American Medical Association's Code of Ethics, I have informed the patient of my clinical impression; the nature and purpose of the treatment or procedure; the  risks, benefits, and possible complications of the intervention; the alternatives, including doing nothing; the risk(s) and benefit(s) of the alternative treatment(s) or procedure(s); and the risk(s) and benefit(s) of doing nothing. The patient was provided information about the general risks and possible complications associated with the procedure. These may  include, but are not limited to: failure to achieve desired goals, infection, bleeding, organ or nerve damage, allergic reactions, paralysis, and death. In addition, the patient was informed of those risks and complications associated to the procedure, such as failure to decrease pain; infection; bleeding; organ or nerve damage with subsequent damage to sensory, motor, and/or autonomic systems, resulting in permanent pain, numbness, and/or weakness of one or several areas of the body; allergic reactions; (i.e.: anaphylactic reaction); and/or death. Furthermore, the patient was informed of those risks and complications associated with the medications. These include, but are not limited to: allergic reactions (i.e.: anaphylactic or anaphylactoid reaction(s)); adrenal axis suppression; blood sugar elevation that in diabetics may result in ketoacidosis or comma; water  retention that in patients with history of congestive heart failure may result in shortness of breath, pulmonary edema, and decompensation with resultant heart failure; weight gain; swelling or edema; medication-induced neural toxicity; particulate matter embolism and blood vessel occlusion with resultant organ, and/or nervous system infarction; and/or aseptic necrosis of one or more joints. Finally, the patient was informed that Medicine is not an exact science; therefore, there is also the possibility of unforeseen or unpredictable risks and/or possible complications that may result in a catastrophic outcome. The patient indicated having understood very clearly. We have given the patient no  guarantees and we have made no promises. Enough time was given to the patient to ask questions, all of which were answered to the patient's satisfaction. Karen Edwards has indicated that she wanted to continue with the procedure. Attestation: I, the ordering provider, attest that I have discussed with the patient the benefits, risks, side-effects, alternatives, likelihood of achieving goals, and potential problems during recovery for the procedure that I have provided informed consent. Date  Time: 04/22/2024  8:14 AM  Pre-Procedure Preparation:  Monitoring: As per clinic protocol. Respiration, ETCO2, SpO2, BP, heart rate and rhythm monitor placed and checked for adequate function Safety Precautions: Patient was assessed for positional comfort and pressure points before starting the procedure. Time-out: I initiated and conducted the Time-out before starting the procedure, as per protocol. The patient was asked to participate by confirming the accuracy of the Time Out information. Verification of the correct person, site, and procedure were performed and confirmed by me, the nursing staff, and the patient. Time-out conducted as per Joint Commission's Universal Protocol (UP.01.01.01). Time: 0840 Start Time: 0840 hrs.  Description of Procedure:          Area Prepped: Entire Posterolateral hip area. ChloraPrep (2% chlorhexidine  gluconate and 70% isopropyl alcohol) Safety Precautions: Aspiration looking for blood return was conducted prior to all injections. At no point did we inject any substances, as a needle was being advanced. No attempts were made at seeking any paresthesias. Safe injection practices and needle disposal techniques used. Medications properly checked for expiration dates. SDV (single dose vial) medications used. Description of the Procedure: Protocol guidelines were followed. The patient was placed in position over the procedure table. The target area was identified and the area  prepped in the usual manner. Skin & deeper tissues infiltrated with local anesthetic. Appropriate amount of time allowed to pass for local anesthetics to take effect. The procedure needles were then advanced to the target area. Proper needle placement secured. Negative aspiration confirmed. Solution injected in intermittent fashion, asking for systemic symptoms every 0.5cc of injectate. The needles were then removed and the area cleansed, making sure to leave some of the prepping solution back to take advantage of its long term  bactericidal properties.  Vitals:   04/22/24 0835 04/22/24 0840 04/22/24 0845 04/22/24 0852  BP: (!) 151/76 (!) 149/83 (!) 149/82 129/76  Pulse:    69  Resp: 17 18 18 16   Temp:      SpO2: 96% 97% 97% 100%  Weight:      Height:        Start Time: 0840 hrs. End Time: 0845 hrs.           Materials:  Needle(s) Type: Spinal Needle Gauge: 22G Length: 5.0-in Medication(s): Please see orders for medications and dosing details.  Imaging Guidance (Non-Spinal):          Type of Imaging Technique: Fluoroscopy Guidance (Non-Spinal) Indication(s): Fluoroscopy guidance for needle placement to enhance accuracy in procedures requiring precise needle localization for targeted delivery of medication in or near specific anatomical locations not easily accessible without such real-time imaging assistance. Exposure Time: Please see nurses notes. Contrast: Before injecting any contrast, we confirmed that the patient did not have an allergy to iodine, shellfish, or radiological contrast. Once satisfactory needle placement was completed at the desired level, radiological contrast was injected. Contrast injected under live fluoroscopy. No contrast complications. See chart for type and volume of contrast used. Fluoroscopic Guidance: I was personally present during the use of fluoroscopy. Tunnel Vision Technique used to obtain the best possible view of the target area. Parallax error  corrected before commencing the procedure. Direction-depth-direction technique used to introduce the needle under continuous pulsed fluoroscopy. Once target was reached, antero-posterior, oblique, and lateral fluoroscopic projection used confirm needle placement in all planes. Images permanently stored in EMR. Interpretation: I personally interpreted the imaging intraoperatively. Adequate needle placement confirmed in multiple planes. Appropriate spread of contrast into desired area was observed. No evidence of afferent or efferent intravascular uptake. Permanent images saved into the patient's record.  Antibiotic Prophylaxis:   Anti-infectives (From admission, onward)    None      Indication(s): None identified  Post-operative Assessment:  Post-procedure Vital Signs:  Pulse/HCG Rate: 6967 Temp: 97.7 F (36.5 C) Resp: 16 BP: 129/76 SpO2: 100 %  EBL: None  Complications: No immediate post-treatment complications observed by team, or reported by patient.  Note: The patient tolerated the entire procedure well. A repeat set of vitals were taken after the procedure and the patient was kept under observation following institutional policy, for this type of procedure. Post-procedural neurological assessment was performed, showing return to baseline, prior to discharge. The patient was provided with post-procedure discharge instructions, including a section on how to identify potential problems. Should any problems arise concerning this procedure, the patient was given instructions to immediately contact us , at any time, without hesitation. In any case, we plan to contact the patient by telephone for a follow-up status report regarding this interventional procedure.  Comments:  No additional relevant information.  Plan of Care (POC)  Orders:  Orders Placed This Encounter  Procedures   HIP INJECTION    Purpose: Therapeutic Indication: Hip pain 2ry to Trochanteric Burlitis bilateral  (M70.61, M70.62).    Scheduling Instructions:     Procedure: Trochanteric bursa injection     Laterality: Bilateral     Sedation: Patient's choice.     Date: 04/22/2024   DG PAIN [facility] C-ARM 1-60 MIN NO REPORT    Intraoperative interpretation by procedural physician at [facility].    Standing Status:   Standing    Number of Occurrences:   1    Reason for exam::   Assistance  in needle guidance and placement for procedures requiring needle placement in or near specific anatomical locations not easily accessible without such assistance.   Informed Consent Details: Physician/Practitioner Attestation; Transcribe to consent form and obtain patient signature    Note: Always confirm laterality of pain with Ms. Zackary, before procedure. Transcribe to consent form and obtain patient signature.    Physician/Practitioner attestation of informed consent for procedure/surgical case:   I, the physician/practitioner, attest that I have discussed with the patient the benefits, risks, side effects, alternatives, likelihood of achieving goals and potential problems during recovery for the procedure that I have provided informed consent.    Procedure:   Hip bursa injection    Physician/Practitioner performing the procedure:   Manual Navarra A. Rokia Bosket, MD    Indication/Reason:   Hip bursitis   Provide equipment / supplies at bedside    Procedure tray: Block Tray (Disposable  single use) Skin infiltration needle: Regular 1.5-in, 25-G, (x1) Block Needle type: Spinal Amount/quantity: 1 Size: Medium (5-inch) Gauge: 22G    Standing Status:   Standing    Number of Occurrences:   1    Specify:   Block Tray   Saline lock IV    Have LR [id number] mL available and administer at 125 mL/hr if patient becomes hypotensive.    Standing Status:   Standing    Number of Occurrences:   1    Opioid Analgesic(s):  Analgesic: Oxycodone  IR 5 mg, 1 tab p.o. twice daily PRN (#20/90 days).  MME/day: 0 mg/day.     Medications ordered for procedure: Meds ordered this encounter  Medications   lidocaine  (XYLOCAINE ) 2 % (with pres) injection 400 mg   pentafluoroprop-tetrafluoroeth (GEBAUERS) aerosol   midazolam  (VERSED ) injection 0.5-2 mg    Make sure Flumazenil is available in the pyxis when using this medication. If oversedation occurs, administer 0.2 mg IV over 15 sec. If after 45 sec no response, administer 0.2 mg again over 1 min; may repeat at 1 min intervals; not to exceed 4 doses (1 mg)   ropivacaine  (PF) 2 mg/mL (0.2%) (NAROPIN ) injection 9 mL   methylPREDNISolone  acetate (DEPO-MEDROL ) injection 80 mg   Medications administered: We administered lidocaine , pentafluoroprop-tetrafluoroeth, midazolam , ropivacaine  (PF) 2 mg/mL (0.2%), and methylPREDNISolone  acetate.  See the medical record for exact dosing, route, and time of administration.    Interventional Therapies  Risk Factors  Considerations:   Mitral valve prolapse (MVP) GERD  urinary stress incontinence  GAD   Planned  Pending:   Diagnostic CT of the cervical spine  Therapeutic bilateral cervical facet RFA #2 (starting with the right side)    Under consideration:   Therapeutic bilateral cervical facet RFA #2  Therapeutic right L1-2 & L3-4 percutaneous discectomy ([employer] compressor)  Therapeutic intrathecal pump trial    Completed:   Therapeutic right cervical ESI x3 (03/11/2024) (100/100/100 x 7 days/100% for radicular  45% for axial)  Diagnostic bilateral GONB x1 (08/19/2019) (100/100/100/,50)  Palliative right cervical facet MBB x3 (07/23/2018) (100/100/100/>50)  Palliative left cervical facet MBB x4 (04/01/2019) (90/90/0/0)  Palliative right cervical facet RFA x1 (09/17/2018) (100/100/85/85)  Palliative left cervical facet RFA x1 (05/04/2019) (100/100/50/50)  Therapeutic left L1-2 LESI x1 (05/10/2021) (100/100/100 x 2 days/0)  Therapeutic right L1-2 LESI x2 (02/10/2018) (100/100/70/50-75)  Therapeutic right L1 TFESI x1  (02/10/2018) (100/100/70/50-75)  Diagnostic/therapeutic left superior cluneal NB (L2, L3 dorsal rami) x1 (04/24/2021) (0/0/0/0)  Diagnostic/therapeutic left L1 TFESI x2 (11/15/2021) (100/100/0)  Diagnostic/therapeutic left L3 TFESI x2 (11/15/2021) (100/100/0)  Bilateral lumbar spinal  cord stimulator trial (done) (05/14/2018) (by me)  Bilateral lumbar spinal cord stimulator implant (09/28/2019) (by me)  Bilateral spinal cord stimulator revision (03/14/2020) (by me)  Therapeutic bilateral lumbar facet MBB R5L6 (12/02/2023) (100/100/80/80)  Diagnostic bilateral SI joint Blk x2 (09/18/2021) (100/100/100)  Palliative left lumbar facet RFA x3 (06/27/2022) (100/100/100/100)  Therapeutic right lumbar facet RFA x3 (05/21/2022) (100/100/75/75)    Therapeutic  Palliative (PRN) options:   Palliative right cervical facet RFA   Diagnostic bilateral SI joint Blk   Therapeutic bilateral lumbar facet RFA    Pharmacotherapy  Nonopioids transfer 08/23/2020: Lidocaine  5% ointment and Robaxin        Follow-up plan:   Return in about 2 weeks (around 05/06/2024) for (VV), (PPE).     Recent Visits Date Type Provider Dept  04/07/24 Office Visit Tanya Glisson, MD [facility]  03/24/24 Office Visit Patel, Seema K, NP [facility]  03/11/24 Procedure visit Tanya Glisson, MD [facility]  02/09/24 Office Visit Tanya Glisson, MD [facility]  02/02/24 Office Visit Tanya Glisson, MD [facility]  Showing recent visits within past 90 days and meeting all other requirements Today's Visits Date Type Provider Dept  04/22/24 Procedure visit Tanya Glisson, MD [facility]  Showing today's visits and meeting all other requirements Future Appointments Date Type Provider Dept  05/06/24 Appointment Tanya Glisson, MD [facility]  06/24/24 Appointment Patel, Seema K, NP [facility]  Showing future appointments within  next 90 days and meeting all other requirements   Disposition: Discharge home  Discharge (Date  Time): 04/22/2024; 0852 hrs.   Primary Care Physician: Arloa Almarie NOVAK, FNP Location: [facility] Note by: Glisson DELENA Tanya, MD (TTS technology used. I apologize for any typographical errors that were not detected and corrected.) Date: 04/22/2024; Time: 9:49 AM  Disclaimer:  Medicine is not an Visual merchandiser. The only guarantee in medicine is that nothing is guaranteed. It is important to note that the decision to proceed with this intervention was based on the information collected from the patient. The Data and conclusions were drawn from the patient's questionnaire, the interview, and the physical examination. Because the information was provided in large part by the patient, it cannot be guaranteed that it has not been purposely or unconsciously manipulated. Every effort has been made to obtain as much relevant data as possible for this evaluation. It is important to note that the conclusions that lead to this procedure are derived in large part from the available data. Always take into account that the treatment will also be dependent on availability of resources and existing treatment guidelines, considered by other Pain Management Practitioners as being common knowledge and practice, at the time of the intervention. For Medico-Legal purposes, it is also important to point out that variation in procedural techniques and pharmacological choices are the acceptable norm. The indications, contraindications, technique, and results of the above procedure should only be interpreted and judged by a Board-Certified Interventional Pain Specialist with extensive familiarity and expertise in the same exact procedure and technique.

## 2024-04-23 ENCOUNTER — Telehealth: Payer: Self-pay

## 2024-04-23 NOTE — Telephone Encounter (Signed)
 Post procedure follow up.  Patient states she is doing well but has some swelling in her feet.  She statesit may be from the heat, but has had it since the injection,. Informed the patient to call us  back if it gets worse or if she has any other symptoms.

## 2024-05-05 ENCOUNTER — Encounter: Payer: Self-pay | Admitting: Pain Medicine

## 2024-05-05 NOTE — Progress Notes (Unsigned)
 Called patient to review injections for 05/06/24 . No answer left message to call when she gets message.

## 2024-05-05 NOTE — Progress Notes (Unsigned)
 PROVIDER NOTE: Interpretation of information contained herein should be left to medically-trained personnel. Specific patient instructions are provided elsewhere under Patient Instructions section of medical record. This document was created in part using AI and STT-dictation technology, any transcriptional errors that may result from this process are unintentional.  Patient: Karen Edwards  Service: E/M   PCP: Arloa Almarie NOVAK, FNP  DOB: 1966-09-04  DOS: 05/06/2024  Provider: Eric DELENA Como, MD  MRN: [id number]  Delivery: Virtual Visit  Specialty: Interventional Pain Management  Type: Established Patient  Setting: Ambulatory outpatient facility  Specialty designation: 09  Referring Prov.: Arloa Almarie NOVAK, FNP  Location: Remote location       Virtual Encounter - Pain Management PROVIDER NOTE: Information contained herein reflects review and annotations entered in association with encounter. Interpretation of such information and data should be left to medically-trained personnel. Information provided to patient can be located elsewhere in the medical record under Patient Instructions. Document created using STT-dictation technology, any transcriptional errors that may result from process are unintentional.    Contact & Pharmacy Preferred: [phone] Home: [phone] (home) Mobile: [phone] (mobile) E-mail: [email or web address]  [facility] - [place], [place] - [address] [place] [address] Phone: [phone] Fax: [phone]   Pre-screening  Karen Edwards offered in-person vs virtual encounter. She indicated preferring virtual for this encounter.   Reason COVID-19*  Social distancing based on CDC and AMA recommendations.   I contacted Karen Edwards on 05/06/2024 via telephone.      I clearly identified myself as Eric DELENA Como, MD. I verified that I was speaking with the correct person using two identifiers (Name:  ALLEX MADIA, and date of birth: [birth year]).  Consent I sought verbal advanced consent from Karen Edwards for virtual visit interactions. I informed Ms. Biggs of possible security and privacy concerns, risks, and limitations associated with providing not-in-person medical evaluation and management services. I also informed Ms. Burrough of the availability of in-person appointments. Finally, I informed her that there would be a charge for the virtual visit and that she could be  personally, fully or partially, financially responsible for it. Ms. Barner expressed understanding and agreed to proceed.   Historic Elements   Karen Edwards is a 58 y.o. year old, female patient evaluated today after our last contact on 04/22/2024. Karen Edwards  has a past medical history of Anxiety, Chronic back pain, Degenerative joint disease, Depression, Headache, Hypotension, IBS (irritable bowel syndrome) (05/2015), MVP (mitral valve prolapse), and Nausea and vomiting. She also  has a past surgical history that includes Cesarean section; Ankle surgery (Right, 03/02/2014); Hemorrhoid surgery (N/A, 06/16/2015); Cervical spine surgery (02/21/2016); Cesarean section with bilateral tubal ligation (1990); Colonoscopy (04/19/2015); Hysteroscopy with D & C (12/24/2013); Colonoscopy with propofol  (N/A, 05/18/2018); Esophagogastroduodenoscopy (egd) with propofol  (N/A, 05/18/2018); Spinal cord stimulator insertion (N/A, 06/18/2018); Tubal ligation; Lumbar spinal cord simulator revision (N/A, 03/14/2020); Colonoscopy with propofol  (N/A, 06/25/2023); Colonoscopy with propofol  (N/A, 08/07/2023); and polypectomy (08/07/2023). Karen Edwards has a current medication list which includes the following prescription(s): acetaminophen , albuterol, atorvastatin , cyanocobalamin , fluoxetine , fluticasone, linaclotide , montelukast, naloxone , ondansetron , oxycodone , oxycodone , [START ON 05/23/2024] oxycodone , promethazine -dextromethorphan,  scopolamine, zolpidem , and zolpidem . She  reports that she has been smoking cigarettes. She has a 18.5 pack-year smoking history. She has never used smokeless tobacco. She reports current alcohol use of about 1.0 standard drink of alcohol per week. She reports that she does not use drugs. Karen Edwards is allergic to gabapentin .  BMI: Estimated body mass index is 28.51 kg/m as calculated from the following:   Height as of 04/22/24: 5' (1.524 m).   Weight as of 04/22/24: 146 lb (66.2 kg). Last encounter: 04/07/2024. Last procedure: 04/22/2024.  HPI  Today, she is being contacted for a post-procedure assessment.  Post-Procedure Evaluation    Procedure:          Anesthesia, Analgesia, Anxiolysis:  Type: Hip bursa injection #1  Primary Purpose: Diagnostic Region: Upper (proximal) Femoral Region Level: Hip Joint Target Area: Trochanteric Bursa Approach: posterolateral approach Laterality: Bilateral  Type: Local Anesthesia Local Anesthetic: Lidocaine  1-2% Sedation: Minimal Anxiolysis  Indication(s):  Analgesia Route: Infiltration ([place]/IM) IV Access: N/A   Position: Prone   1. Chronic hip pain (3ry area of Pain) (Bilateral) (R>L)   2. Enthesopathy of hip region (Bilateral)   3. Trochanteric bursitis (Left)    NAS-11 Pain score:   Pre-procedure: 6 /10   Post-procedure: 0-No pain/10     Effectiveness:  Initial hour after procedure:   ***. Subsequent 4-6 hours post-procedure:   ***. Analgesia past initial 6 hours:   ***. Ongoing improvement:  Analgesic:  *** Function:    ***    ROM:    ***     Pharmacotherapy Assessment  Opioid Analgesic(s): Oxycodone  IR 5 mg, 1 tab p.o. twice daily PRN (#20/90 days).  MME/day: 0 mg/day.   Monitoring: [place] PMP: PDMP reviewed during this encounter.       Pharmacotherapy: No side-effects or adverse reactions reported. Compliance: No problems identified. Effectiveness: Clinically acceptable. Plan: Refer to POC.  UDS:  Summary  Date Value Ref  Range Status  07/30/2023 Note  Final    Comment:    ==================================================================== [employer] (MW) ==================================================================== Test                             Result       Flag       Units  Drug Present and Declared for Prescription Verification   Noroxycodone                   193          EXPECTED   ng/mg creat    Noroxycodone is an expected metabolite of oxycodone . Sources of    oxycodone  include scheduled prescription medications.  Drug Absent but Declared for Prescription Verification   Oxycodone                       Not Detected UNEXPECTED ng/mg creat    Oxycodone  is almost always present in patients taking this drug    consistently.  Absence of oxycodone  could be due to lapse of time    since the last dose or unusual pharmacokinetics (rapid metabolism).  ==================================================================== Test                      Result    Flag   Units      Ref Range   Creatinine              80               mg/dL      >=79 ==================================================================== Declared Medications:  The flagging and interpretation on this report are based on the  following declared medications.  Unexpected results may arise from  inaccuracies in the declared medications.   **Note: The testing scope of this panel  includes these medications:   Oxycodone  (Roxicodone )   **Note: The testing scope of this panel does not include the  following reported medications:   Acetaminophen  (Tylenol )  Fluoxetine  (Prozac )  Linaclotide  (Linzess )  Naloxone  (Narcan )  Ondansetron  (Zofran )  Quetiapine (Seroquel)  Scopolamine ==================================================================== For clinical consultation, please call [phone]. ====================================================================    No results found for: CBDTHCRKATHLYNE,  D9THCCBX  Laboratory Chemistry Profile   Renal Lab Results  Component Value Date   BUN 12 02/01/2021   CREATININE 0.85 02/01/2021   BCR 8 (L) 09/08/2017   GFR 77.34 02/01/2021   GFRAA 93 09/08/2017   GFRNONAA 81 09/08/2017    Hepatic Lab Results  Component Value Date   AST 15 02/01/2021   ALT 11 02/01/2021   ALBUMIN 4.4 02/01/2021   ALKPHOS 59 02/01/2021    Electrolytes Lab Results  Component Value Date   NA 139 02/01/2021   K 4.2 02/01/2021   CL 105 02/01/2021   CALCIUM  9.3 02/01/2021   MG 2.1 09/08/2017    Bone Lab Results  Component Value Date   VD25OH 43.74 01/10/2016   25OHVITD1 51 09/08/2017   25OHVITD2 <1.0 09/08/2017   25OHVITD3 51 09/08/2017    Inflammation (CRP: Acute Phase) (ESR: Chronic Phase) Lab Results  Component Value Date   CRP 1.4 09/08/2017   ESRSEDRATE 11 09/08/2017         Note: Above Lab results reviewed.  Imaging  DG PAIN CLINIC C-ARM 1-60 MIN NO REPORT Fluoro was used, but no Radiologist interpretation will be provided.  Please refer to NOTES tab for provider progress note.  Assessment  The primary encounter diagnosis was Chronic hip pain (3ry area of Pain) (Bilateral) (R>L). Diagnoses of Enthesopathy of hip region (Bilateral), Trochanteric bursitis (Left), and Postop check were also pertinent to this visit.  Plan of Care  Problem-specific:  No problem-specific Assessment & Plan notes found for this encounter.  Karen Edwards has a current medication list which includes the following long-term medication(s): albuterol, fluoxetine , fluticasone, linaclotide , montelukast, oxycodone , oxycodone , and [START ON 05/23/2024] oxycodone .  Pharmacotherapy (Medications Ordered): No orders of the defined types were placed in this encounter.  Orders:  No orders of the defined types were placed in this encounter.  Follow-up plan:   No follow-ups on file.      Interventional Therapies  Risk Factors  Considerations:   Mitral  valve prolapse (MVP) GERD  urinary stress incontinence  GAD   Planned  Pending:   Diagnostic CT of the cervical spine  Therapeutic bilateral cervical facet RFA #2 (starting with the right side)    Under consideration:   Therapeutic bilateral cervical facet RFA #2  Therapeutic right L1-2 & L3-4 percutaneous discectomy ([employer] compressor)  Therapeutic intrathecal pump trial    Completed:   Therapeutic right cervical ESI x3 (03/11/2024) (100/100/100 x 7 days/100% for radicular  45% for axial)  Diagnostic bilateral GONB x1 (08/19/2019) (100/100/100/,50)  Palliative right cervical facet MBB x3 (07/23/2018) (100/100/100/>50)  Palliative left cervical facet MBB x4 (04/01/2019) (90/90/0/0)  Palliative right cervical facet RFA x1 (09/17/2018) (100/100/85/85)  Palliative left cervical facet RFA x1 (05/04/2019) (100/100/50/50)  Therapeutic left L1-2 LESI x1 (05/10/2021) (100/100/100 x 2 days/0)  Therapeutic right L1-2 LESI x2 (02/10/2018) (100/100/70/50-75)  Therapeutic right L1 TFESI x1 (02/10/2018) (100/100/70/50-75)  Diagnostic/therapeutic left superior cluneal NB (L2, L3 dorsal rami) x1 (04/24/2021) (0/0/0/0)  Diagnostic/therapeutic left L1 TFESI x2 (11/15/2021) (100/100/0)  Diagnostic/therapeutic left L3 TFESI x2 (11/15/2021) (100/100/0)  Bilateral lumbar spinal cord stimulator trial (  done) (05/14/2018) (by me)  Bilateral lumbar spinal cord stimulator implant (09/28/2019) (by me)  Bilateral spinal cord stimulator revision (03/14/2020) (by me)  Therapeutic bilateral lumbar facet MBB R5L6 (12/02/2023) (100/100/80/80)  Diagnostic bilateral SI joint Blk x2 (09/18/2021) (100/100/100)  Palliative left lumbar facet RFA x3 (06/27/2022) (100/100/100/100)  Therapeutic right lumbar facet RFA x3 (05/21/2022) (100/100/75/75)    Therapeutic  Palliative (PRN) options:   Palliative right cervical facet RFA   Diagnostic bilateral SI joint Blk   Therapeutic bilateral lumbar facet RFA    Pharmacotherapy   Nonopioids transfer 08/23/2020: Lidocaine  5% ointment and Robaxin        Recent Visits Date Type Provider Dept  04/22/24 Procedure visit Tanya Glisson, MD [facility]  04/07/24 Office Visit Tanya Glisson, MD [facility]  03/24/24 Office Visit Patel, Seema K, NP [facility]  03/11/24 Procedure visit Tanya Glisson, MD [facility]  02/09/24 Office Visit Tanya Glisson, MD [facility]  Showing recent visits within past 90 days and meeting all other requirements Future Appointments Date Type Provider Dept  05/06/24 Appointment Tanya Glisson, MD [facility]  06/24/24 Appointment Patel, Seema K, NP [facility]  Showing future appointments within next 90 days and meeting all other requirements  I discussed the assessment and treatment plan with the patient. The patient was provided an opportunity to ask questions and all were answered. The patient agreed with the plan and demonstrated an understanding of the instructions.  Patient advised to call back or seek an in-person evaluation if the symptoms or condition worsens.  Duration of encounter: *** minutes.  Note by: Glisson DELENA Tanya, MD Date: 05/06/2024; Time: 3:07 PM

## 2024-05-06 ENCOUNTER — Ambulatory Visit: Attending: Pain Medicine | Admitting: Pain Medicine

## 2024-05-06 DIAGNOSIS — G8929 Other chronic pain: Secondary | ICD-10-CM

## 2024-05-06 DIAGNOSIS — M7062 Trochanteric bursitis, left hip: Secondary | ICD-10-CM | POA: Diagnosis not present

## 2024-05-06 DIAGNOSIS — M76891 Other specified enthesopathies of right lower limb, excluding foot: Secondary | ICD-10-CM

## 2024-05-06 DIAGNOSIS — R52 Pain, unspecified: Secondary | ICD-10-CM | POA: Insufficient documentation

## 2024-05-06 DIAGNOSIS — Z09 Encounter for follow-up examination after completed treatment for conditions other than malignant neoplasm: Secondary | ICD-10-CM

## 2024-05-06 DIAGNOSIS — M76892 Other specified enthesopathies of left lower limb, excluding foot: Secondary | ICD-10-CM

## 2024-05-06 DIAGNOSIS — M792 Neuralgia and neuritis, unspecified: Secondary | ICD-10-CM | POA: Insufficient documentation

## 2024-05-06 MED ORDER — PREGABALIN 25 MG PO CAPS: 25.0000 mg | ORAL_CAPSULE | Freq: Every day | ORAL | 0 refills | Status: DC

## 2024-05-10 ENCOUNTER — Other Ambulatory Visit: Payer: Self-pay | Admitting: Pain Medicine

## 2024-05-10 ENCOUNTER — Telehealth: Payer: Self-pay

## 2024-05-10 NOTE — Telephone Encounter (Signed)
 Insurance Treatment Denial Note  Date order was entered:  Order entered by: Eric Como, MD,Seema Tobie, NP Requested treatment: cervical RFA Reason for denial: see below Recommended for approval: see below    Just like I thought we will have to repeat the cervical facets. It has been over 5 years since her previous RFA. Please order cervical facets if that is what you plan to do now and I will try to get them approved.

## 2024-05-25 ENCOUNTER — Telehealth: Payer: Self-pay | Admitting: *Deleted

## 2024-05-25 NOTE — Telephone Encounter (Signed)
 Attempted to call for pre VV review of meds/allergies. Message left.

## 2024-05-26 ENCOUNTER — Encounter: Payer: Self-pay | Admitting: Pain Medicine

## 2024-05-26 ENCOUNTER — Ambulatory Visit: Attending: Pain Medicine | Admitting: Pain Medicine

## 2024-05-26 DIAGNOSIS — M25552 Pain in left hip: Secondary | ICD-10-CM | POA: Diagnosis not present

## 2024-05-26 DIAGNOSIS — M792 Neuralgia and neuritis, unspecified: Secondary | ICD-10-CM | POA: Diagnosis not present

## 2024-05-26 DIAGNOSIS — M7062 Trochanteric bursitis, left hip: Secondary | ICD-10-CM

## 2024-05-26 DIAGNOSIS — R52 Pain, unspecified: Secondary | ICD-10-CM

## 2024-05-26 DIAGNOSIS — M25551 Pain in right hip: Secondary | ICD-10-CM | POA: Diagnosis not present

## 2024-05-26 DIAGNOSIS — Z09 Encounter for follow-up examination after completed treatment for conditions other than malignant neoplasm: Secondary | ICD-10-CM

## 2024-05-26 DIAGNOSIS — G8929 Other chronic pain: Secondary | ICD-10-CM

## 2024-05-26 MED ORDER — PREGABALIN 25 MG PO CAPS: 25.0000 mg | ORAL_CAPSULE | Freq: Two times a day (BID) | ORAL | 0 refills | Status: DC

## 2024-05-26 NOTE — Progress Notes (Signed)
 PROVIDER NOTE: Interpretation of information contained herein should be left to medically-trained personnel. Specific patient instructions are provided elsewhere under Patient Instructions section of medical record. This document was created in part using AI and STT-dictation technology, any transcriptional errors that may result from this process are unintentional.  Patient: Karen Edwards  Service: E/M   PCP: Arloa Almarie NOVAK, FNP  DOB: 06/26/1966  DOS: 05/26/2024  Provider: Eric DELENA Como, MD  MRN: [id number]  Delivery: Virtual Visit  Specialty: Interventional Pain Management  Type: Established Patient  Setting: Ambulatory outpatient facility  Specialty designation: 09  Referring Prov.: Arloa Almarie NOVAK, FNP  Location: Remote location       Virtual Encounter - Pain Management PROVIDER NOTE: Information contained herein reflects review and annotations entered in association with encounter. Interpretation of such information and data should be left to medically-trained personnel. Information provided to patient can be located elsewhere in the medical record under Patient Instructions. Document created using STT-dictation technology, any transcriptional errors that may result from process are unintentional.    Contact & Pharmacy Preferred: [phone] Home: [phone] (home) Mobile: [phone] (mobile) E-mail: [email or web address]  [id number] - [place], [place] - [address] [place] [address] Phone: [phone] Fax: [phone]   Pre-screening  Karen Edwards offered in-person vs virtual encounter. She indicated preferring virtual for this encounter.   Reason COVID-19*  Social distancing based on CDC and AMA recommendations.   I contacted Karen Edwards on 05/26/2024 via telephone.      I clearly identified myself as Eric DELENA Como, MD. I verified that I was speaking with the correct person using two identifiers (Name:  Karen Edwards, and date of birth: 09-21-1966).  Consent I sought verbal advanced consent from Karen Edwards for virtual visit interactions. I informed Karen Edwards of possible security and privacy concerns, risks, and limitations associated with providing not-in-person medical evaluation and management services. I also informed Karen Edwards of the availability of in-person appointments. Finally, I informed her that there would be a charge for the virtual visit and that she could be  personally, fully or partially, financially responsible for it. Karen Edwards expressed understanding and agreed to proceed.   Historic Elements   Karen Edwards is a 58 y.o. year old, female patient evaluated today after our last contact on 05/06/2024. Karen Edwards  has a past medical history of Anxiety, Chronic back pain, Degenerative joint disease, Depression, Headache, Hypotension, IBS (irritable bowel syndrome) (05/2015), MVP (mitral valve prolapse), and Nausea and vomiting. She also  has a past surgical history that includes Cesarean section; Ankle surgery (Right, 03/02/2014); Hemorrhoid surgery (N/A, 06/16/2015); Cervical spine surgery (02/21/2016); Cesarean section with bilateral tubal ligation (1990); Colonoscopy (04/19/2015); Hysteroscopy with D & C (12/24/2013); Colonoscopy with propofol  (N/A, 05/18/2018); Esophagogastroduodenoscopy (egd) with propofol  (N/A, 05/18/2018); Spinal cord stimulator insertion (N/A, 06/18/2018); Tubal ligation; Lumbar spinal cord simulator revision (N/A, 03/14/2020); Colonoscopy with propofol  (N/A, 06/25/2023); Colonoscopy with propofol  (N/A, 08/07/2023); and polypectomy (08/07/2023). Karen Edwards has a current medication list which includes the following prescription(s): acetaminophen , albuterol, atorvastatin , cyanocobalamin , fluoxetine , fluticasone, linaclotide , montelukast, naloxone , ondansetron , oxycodone , oxycodone , oxycodone , pregabalin , promethazine -dextromethorphan, scopolamine,  zolpidem , and zolpidem . She  reports that she has been smoking cigarettes. She has a 18.5 pack-year smoking history. She has never used smokeless tobacco. She reports current alcohol use of about 1.0 standard drink of alcohol per week. She reports that she does not use drugs. Karen Edwards is allergic to gabapentin .  BMI:  Estimated body mass index is 28.51 kg/m as calculated from the following:   Height as of 04/22/24: 5' (1.524 m).   Weight as of 04/22/24: 146 lb (66.2 kg). Last encounter: 05/06/2024. Last procedure: 04/22/2024.  HPI  Today, she is being contacted for medication management.  Recently we had started the patient on pregabalin  (Lyrica ) 25 mg capsule, 1 cap p.o. at bedtime, to see if this would help with the neuropathic component of her pain.  Today we have contacted the patient for follow-up on that trial.  The patient indicates that she is not having any side effects of adverse reactions to the Lyrica .  She feels that it could be helping, but perhaps the dose is still too low.  Today I have informed the patient that for this type of pain the maximum daily dose would be a total of 450 mg/day, divided into doses of 225 mg.  However, it is my experience that attempting to increase the pregabalin  too quickly causes the patients to experience side effects adverse reactions which leads to poor compliance with therapy.  For this reason I will continue to titrate the medication up slowly as tolerated.  Today I will go from 25 mg p.o. at bedtime to 25 mg p.o. twice daily and I will continue to increase it every month until she gets adequate benefit or she encounters any side effects.  Since she is currently free of any type of side effects or adverse reactions, we will continue to titrated up.  This information was shared with the patient who understood and accepted.  Pharmacotherapy Assessment  Opioid Analgesic(s): Oxycodone  IR 5 mg, 1 tab p.o. twice daily PRN (#20/90 days).  MME/day: 0 mg/day.    Monitoring: [place] PMP: PDMP reviewed during this encounter.       Pharmacotherapy: No side-effects or adverse reactions reported. Compliance: No problems identified. Effectiveness: Clinically acceptable. Plan: Refer to POC.  UDS:  Summary  Date Value Ref Range Status  07/30/2023 Note  Final    Comment:    ==================================================================== [facility] (MW) ==================================================================== Test                             Result       Flag       Units  Drug Present and Declared for Prescription Verification   Noroxycodone                   193          EXPECTED   ng/mg creat    Noroxycodone is an expected metabolite of oxycodone . Sources of    oxycodone  include scheduled prescription medications.  Drug Absent but Declared for Prescription Verification   Oxycodone                       Not Detected UNEXPECTED ng/mg creat    Oxycodone  is almost always present in patients taking this drug    consistently.  Absence of oxycodone  could be due to lapse of time    since the last dose or unusual pharmacokinetics (rapid metabolism).  ==================================================================== Test                      Result    Flag   Units      Ref Range   Creatinine              80  mg/dL      >=79 ==================================================================== Declared Medications:  The flagging and interpretation on this report are based on the  following declared medications.  Unexpected results may arise from  inaccuracies in the declared medications.   **Note: The testing scope of this panel includes these medications:   Oxycodone  (Roxicodone )   **Note: The testing scope of this panel does not include the  following reported medications:   Acetaminophen  (Tylenol )  Fluoxetine  (Prozac )  Linaclotide  (Linzess )  Naloxone  (Narcan )  Ondansetron  (Zofran )  Quetiapine  (Seroquel)  Scopolamine ==================================================================== For clinical consultation, please call [phone]. ====================================================================    No results found for: CBDTHCRKATHLYNE, D9THCCBX  Laboratory Chemistry Profile   Renal Lab Results  Component Value Date   BUN 12 02/01/2021   CREATININE 0.85 02/01/2021   BCR 8 (L) 09/08/2017   GFR 77.34 02/01/2021   GFRAA 93 09/08/2017   GFRNONAA 81 09/08/2017    Hepatic Lab Results  Component Value Date   AST 15 02/01/2021   ALT 11 02/01/2021   ALBUMIN 4.4 02/01/2021   ALKPHOS 59 02/01/2021    Electrolytes Lab Results  Component Value Date   NA 139 02/01/2021   K 4.2 02/01/2021   CL 105 02/01/2021   CALCIUM  9.3 02/01/2021   MG 2.1 09/08/2017    Bone Lab Results  Component Value Date   VD25OH 43.74 01/10/2016   25OHVITD1 51 09/08/2017   25OHVITD2 <1.0 09/08/2017   25OHVITD3 51 09/08/2017    Inflammation (CRP: Acute Phase) (ESR: Chronic Phase) Lab Results  Component Value Date   CRP 1.4 09/08/2017   ESRSEDRATE 11 09/08/2017         Note: Above Lab results reviewed.  Imaging  DG PAIN CLINIC C-ARM 1-60 MIN NO REPORT Fluoro was used, but no Radiologist interpretation will be provided.  Please refer to NOTES tab for provider progress note.  Assessment  The primary encounter diagnosis was Chronic hip pain (3ry area of Pain) (Bilateral) (R>L). Diagnoses of Trochanteric bursitis (Left), Burning pain, Neuropathic pain, Postop check, and Neurogenic pain were also pertinent to this visit.  Plan of Care  Problem-specific:  No problem-specific Assessment & Plan notes found for this encounter.  Ms. PRANAVI AURE has a current medication list which includes the following long-term medication(s): albuterol, fluoxetine , fluticasone, linaclotide , montelukast, oxycodone , oxycodone , oxycodone , and pregabalin .  Pharmacotherapy (Medications  Ordered): Meds ordered this encounter  Medications   pregabalin  (LYRICA ) 25 MG capsule    Sig: Take 1 capsule (25 mg total) by mouth 2 (two) times daily.    Dispense:  60 capsule    Refill:  0    Fill one day early if pharmacy is closed on scheduled refill date. May substitute for generic if available.   Orders:  Orders Placed This Encounter  Procedures   Nursing Instructions:    Please complete this patient's postprocedure evaluation.    Scheduling Instructions:     Please complete this patient's postprocedure evaluation.   Follow-up plan:   Return in about 30 days (around 06/25/2024) for Eval-day (M,W), (VV), (MM) (Lyrica  titration).      Interventional Therapies  Risk Factors  Considerations:   Mitral valve prolapse (MVP) GERD  urinary stress incontinence  GAD   Planned  Pending:   (05/26/2024) continue to titrate Lyrica  as tolerated.  Dose increased today from 25 mg at bedtime to 25 mg BID.   Under consideration:      Completed:   Diagnostic/therapeutic bilateral TBI x1 (04/22/2024) (100/100/40/40)  Therapeutic right  cervical ESI x3 (03/11/2024) (100/100/100 x 7 days/100% for radicular  45% for axial)  Diagnostic bilateral GONB x1 (08/19/2019) (100/100/100/,50)  Palliative right cervical facet MBB x3 (07/23/2018) (100/100/100/>50)  Palliative left cervical facet MBB x4 (04/01/2019) (90/90/0/0)  Palliative right cervical facet RFA x1 (09/17/2018) (100/100/85/85)  Palliative left cervical facet RFA x1 (05/04/2019) (100/100/50/50)  Therapeutic left L1-2 LESI x1 (05/10/2021) (100/100/100 x 2 days/0)  Therapeutic right L1-2 LESI x2 (02/10/2018) (100/100/70/50-75)  Therapeutic right L1 TFESI x1 (02/10/2018) (100/100/70/50-75)  Diagnostic/therapeutic left superior cluneal NB (L2, L3 dorsal rami) x1 (04/24/2021) (0/0/0/0)  Diagnostic/therapeutic left L1 TFESI x2 (11/15/2021) (100/100/0)  Diagnostic/therapeutic left L3 TFESI x2 (11/15/2021) (100/100/0)  Bilateral lumbar spinal cord  stimulator trial (done) (05/14/2018) (by me)  Bilateral lumbar spinal cord stimulator implant (09/28/2019) (by me)  Bilateral spinal cord stimulator revision (03/14/2020) (by me)  Therapeutic bilateral lumbar facet MBB R5L6 (12/02/2023) (100/100/80/80)  Diagnostic bilateral SI joint Blk x2 (09/18/2021) (100/100/100)  Palliative left lumbar facet RFA x3 (06/27/2022) (100/100/100/100)  Therapeutic right lumbar facet RFA x3 (05/21/2022) (100/100/75/75)    Therapeutic  Palliative (PRN) options:   Palliative right cervical facet RFA   Diagnostic bilateral SI joint Blk   Therapeutic bilateral lumbar facet RFA    Pharmacotherapy  Nonopioids transfer 08/23/2020: Lidocaine  5% ointment and Robaxin .       Recent Visits Date Type Provider Dept  05/06/24 Office Visit Tanya Glisson, MD [facility]  04/22/24 Procedure visit Tanya Glisson, MD [facility]  04/07/24 Office Visit Tanya Glisson, MD [facility]  03/24/24 Office Visit Patel, Seema K, NP [facility]  03/11/24 Procedure visit Tanya Glisson, MD [facility]  Showing recent visits within past 90 days and meeting all other requirements Today's Visits Date Type Provider Dept  05/26/24 Office Visit Tanya Glisson, MD [facility]  Showing today's visits and meeting all other requirements Future Appointments Date Type Provider Dept  06/24/24 Appointment Patel, Seema K, NP [facility]  Showing future appointments within next 90 days and meeting all other requirements  I discussed the assessment and treatment plan with the patient. The patient was provided an opportunity to ask questions and all were answered. The patient agreed with the plan and demonstrated an understanding of the instructions.  Patient advised to call back or seek an in-person evaluation if the symptoms or condition worsens.  Duration of encounter: 18 minutes.  Note by: Glisson DELENA Tanya, MD Date: 05/26/2024; Time: 9:09 AM

## 2024-06-22 ENCOUNTER — Other Ambulatory Visit: Payer: Self-pay | Admitting: Pain Medicine

## 2024-06-22 DIAGNOSIS — R52 Pain, unspecified: Secondary | ICD-10-CM

## 2024-06-22 DIAGNOSIS — M792 Neuralgia and neuritis, unspecified: Secondary | ICD-10-CM

## 2024-06-22 DIAGNOSIS — G8929 Other chronic pain: Secondary | ICD-10-CM

## 2024-06-23 ENCOUNTER — Ambulatory Visit: Attending: Pain Medicine | Admitting: Pain Medicine

## 2024-06-23 DIAGNOSIS — M47816 Spondylosis without myelopathy or radiculopathy, lumbar region: Secondary | ICD-10-CM

## 2024-06-23 DIAGNOSIS — G8929 Other chronic pain: Secondary | ICD-10-CM

## 2024-06-23 DIAGNOSIS — M79604 Pain in right leg: Secondary | ICD-10-CM

## 2024-06-23 DIAGNOSIS — R52 Pain, unspecified: Secondary | ICD-10-CM

## 2024-06-23 DIAGNOSIS — M25552 Pain in left hip: Secondary | ICD-10-CM

## 2024-06-23 DIAGNOSIS — G894 Chronic pain syndrome: Secondary | ICD-10-CM

## 2024-06-23 DIAGNOSIS — Z79899 Other long term (current) drug therapy: Secondary | ICD-10-CM

## 2024-06-23 DIAGNOSIS — M5442 Lumbago with sciatica, left side: Secondary | ICD-10-CM | POA: Diagnosis not present

## 2024-06-23 DIAGNOSIS — M792 Neuralgia and neuritis, unspecified: Secondary | ICD-10-CM

## 2024-06-23 DIAGNOSIS — Z79891 Long term (current) use of opiate analgesic: Secondary | ICD-10-CM

## 2024-06-23 DIAGNOSIS — M25551 Pain in right hip: Secondary | ICD-10-CM | POA: Diagnosis not present

## 2024-06-23 DIAGNOSIS — M5481 Occipital neuralgia: Secondary | ICD-10-CM

## 2024-06-23 DIAGNOSIS — M542 Cervicalgia: Secondary | ICD-10-CM

## 2024-06-23 DIAGNOSIS — M79605 Pain in left leg: Secondary | ICD-10-CM

## 2024-06-23 DIAGNOSIS — M5441 Lumbago with sciatica, right side: Secondary | ICD-10-CM

## 2024-06-23 MED ORDER — PREGABALIN 50 MG PO CAPS: 50.0000 mg | ORAL_CAPSULE | Freq: Two times a day (BID) | ORAL | 0 refills | Status: DC

## 2024-06-23 MED ORDER — OXYCODONE HCL 5 MG PO TABS: 5.0000 mg | ORAL_TABLET | Freq: Every day | ORAL | 0 refills | Status: DC

## 2024-06-23 NOTE — Progress Notes (Signed)
 PROVIDER NOTE: Interpretation of information contained herein should be left to medically-trained personnel. Specific patient instructions are provided elsewhere under Patient Instructions section of medical record. This document was created in part using AI and STT-dictation technology, any transcriptional errors that may result from this process are unintentional.  Patient: Karen Edwards  Service: E/M   PCP: Arloa Almarie NOVAK, FNP  DOB: 20-Apr-1966  DOS: 06/23/2024  Provider: Eric DELENA Como, MD  MRN: [id number]  Delivery: Virtual Visit  Specialty: Interventional Pain Management  Type: Established Patient  Setting: Ambulatory outpatient facility  Specialty designation: 09  Referring Prov.: Arloa Almarie NOVAK, FNP  Location: Remote location       Virtual Encounter - Pain Management PROVIDER NOTE: Information contained herein reflects review and annotations entered in association with encounter. Interpretation of such information and data should be left to medically-trained personnel. Information provided to patient can be located elsewhere in the medical record under Patient Instructions. Document created using STT-dictation technology, any transcriptional errors that may result from process are unintentional.    Contact & Pharmacy Preferred: [phone] Home: [phone] (home) Mobile: [phone] (mobile) E-mail: [id number] - [place], [place] - [address] [place] [address] Phone: [phone] Fax: [phone]   Pre-screening  [name] offered in-person vs virtual encounter. She indicated preferring virtual for this encounter.   Reason COVID-19*  Social distancing based on CDC and AMA recommendations.   I contacted Karen Edwards on 06/23/2024 via telephone.      I clearly identified myself as Eric DELENA Como, MD. I verified that I was speaking with the correct person using two identifiers (Name:  ORISSA ARREAGA, and date of birth: [birth year]).  Consent I sought verbal advanced consent from Karen Edwards for virtual visit interactions. I informed Ms. Altizer of possible security and privacy concerns, risks, and limitations associated with providing not-in-person medical evaluation and management services. I also informed Ms. Hammontree of the availability of in-person appointments. Finally, I informed her that there would be a charge for the virtual visit and that she could be  personally, fully or partially, financially responsible for it. Ms. Falter expressed understanding and agreed to proceed.   Historic Elements   Karen Edwards is a 58 y.o. year old, female patient evaluated today after our last contact on 06/22/2024. Karen Edwards  has a past medical history of Anxiety, Chronic back pain, Degenerative joint disease, Depression, Headache, Hypotension, IBS (irritable bowel syndrome) (05/2015), MVP (mitral valve prolapse), and Nausea and vomiting. She also  has a past surgical history that includes Cesarean section; Ankle surgery (Right, 03/02/2014); Hemorrhoid surgery (N/A, 06/16/2015); Cervical spine surgery (02/21/2016); Cesarean section with bilateral tubal ligation (1990); Colonoscopy (04/19/2015); Hysteroscopy with D & C (12/24/2013); Colonoscopy with propofol  (N/A, 05/18/2018); Esophagogastroduodenoscopy (egd) with propofol  (N/A, 05/18/2018); Spinal cord stimulator insertion (N/A, 06/18/2018); Tubal ligation; Lumbar spinal cord simulator revision (N/A, 03/14/2020); Colonoscopy with propofol  (N/A, 06/25/2023); Colonoscopy with propofol  (N/A, 08/07/2023); and polypectomy (08/07/2023). [name] has a current medication list which includes the following prescription(s): acetaminophen , albuterol, atorvastatin , cyanocobalamin , fluoxetine , fluticasone, linaclotide , montelukast, naloxone , ondansetron , promethazine -dextromethorphan, scopolamine, zolpidem , zolpidem , [START ON 06/24/2024]  oxycodone , and pregabalin . She  reports that she has been smoking cigarettes. She has a 18.5 pack-year smoking history. She has never used smokeless tobacco. She reports current alcohol use of about 1.0 standard drink of alcohol per week. She reports that she does not use drugs. Ms. Winkles is allergic to gabapentin .  BMI: Estimated body mass index is 28.51 kg/m as calculated from the following:   Height as of 04/22/24: 5' (1.524 m).   Weight as of 04/22/24: 146 lb (66.2 kg). Last encounter: 05/26/2024. Last procedure: 04/22/2024.  HPI  Today, she is being contacted for medication management. Patient recently started on Lyrica .  She is being evaluated for review of medication effectiveness and adjustment.   The patient indicates doing well with the current medication regimen. No adverse reactions or side effects reported to the medications.  Discussed the use of AI scribe software for clinical note transcription with the patient, who gave verbal consent to proceed.  History of Present Illness   Karen Edwards is a 58 year old female who presents for titration of pregabalin  for pain management.  She takes pregabalin  25 mg once daily at bedtime, which alleviates her symptoms without causing adverse effects. She is interested in increasing the pregabalin  dose. She also takes oxycodone  IR 5 mg at night without issues.  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 She has four pills left from her last prescription filled on July 5th and is concerned about managing her medication refills due to her caregiving responsibilities.       RTCB: 07/24/2024   Pharmacotherapy Assessment  Opioid Analgesic(s): Oxycodone  IR 5 mg, 1 tab p.o. twice daily PRN (#20/90 days).  MME/day: 0 mg/day.   Monitoring: [place] PMP: PDMP reviewed during this encounter.       Pharmacotherapy: No side-effects or adverse reactions  reported. Compliance: No problems identified. Effectiveness: Clinically acceptable. Plan: Refer to POC.  UDS:  Summary  Date Value Ref Range Status  07/30/2023 Note  Final    Comment:    ==================================================================== [employer] Select 13 (MW) ==================================================================== Test                             Result       Flag       Units  Drug Present and Declared for Prescription Verification   Noroxycodone                   193          EXPECTED   ng/mg creat    Noroxycodone is an expected metabolite of oxycodone . Sources of    oxycodone  include scheduled prescription medications.  Drug Absent but Declared for Prescription Verification   Oxycodone                       Not Detected UNEXPECTED ng/mg creat    Oxycodone  is almost always present in patients taking this drug    consistently.  Absence of oxycodone  could be due to lapse of time    since the last dose or unusual pharmacokinetics (rapid metabolism).  ==================================================================== Test                      Result    Flag   Units      Ref Range   Creatinine              80               mg/dL      >=79 ==================================================================== Declared Medications:  The flagging and interpretation on this report are based on the  following declared medications.  Unexpected results may arise from  inaccuracies in the declared medications.   **Note: The testing scope of this panel includes these medications:   Oxycodone  (Roxicodone )   **Note: The testing scope of this panel does not include the  following reported medications:   Acetaminophen  (Tylenol )  Fluoxetine  (Prozac )  Linaclotide  (Linzess )  Naloxone  (Narcan )  Ondansetron  (Zofran )  Quetiapine (Seroquel)  Scopolamine ==================================================================== For clinical consultation, please  call [phone]. ====================================================================    No results found for: CBDTHCRKATHLYNE, D9THCCBX  Laboratory Chemistry Profile   Renal Lab Results  Component Value Date   BUN 12 02/01/2021   CREATININE 0.85 02/01/2021   BCR 8 (L) 09/08/2017   GFR 77.34 02/01/2021   GFRAA 93 09/08/2017   GFRNONAA 81 09/08/2017    Hepatic Lab Results  Component Value Date   AST 15 02/01/2021   ALT 11 02/01/2021   ALBUMIN 4.4 02/01/2021   ALKPHOS 59 02/01/2021    Electrolytes Lab Results  Component Value Date   NA 139 02/01/2021   K 4.2 02/01/2021   CL 105 02/01/2021   CALCIUM  9.3 02/01/2021   MG 2.1 09/08/2017    Bone Lab Results  Component Value Date   VD25OH 43.74 01/10/2016   25OHVITD1 51 09/08/2017   25OHVITD2 <1.0 09/08/2017   25OHVITD3 51 09/08/2017    Inflammation (CRP: Acute Phase) (ESR: Chronic Phase) Lab Results  Component Value Date   CRP 1.4 09/08/2017   ESRSEDRATE 11 09/08/2017         Note: Above Lab results reviewed.  Imaging  DG PAIN CLINIC C-ARM 1-60 MIN NO REPORT Fluoro was used, but no Radiologist interpretation will be provided.  Please refer to NOTES tab for provider progress note.  Assessment  Diagnoses of Chronic hip pain (3ry area of Pain) (Bilateral) (R>L), Burning pain, Neuropathic pain, Neurogenic pain, Lumbar facet syndrome (Bilateral) (L>R), Chronic low back pain (1ry area of Pain) (Bilateral) (R>L) w/ sciatica (Bilateral), Chronic lower extremity pain (2ry area of Pain) (Bilateral) (R>L), Chronic neck pain (4th area of Pain) (Bilateral) (R>L), Chronic occipital neuralgia (5th area of Pain) (Bilateral) (R>L), Chronic pain syndrome, Pharmacologic therapy, Chronic use of opiate for therapeutic purpose, Encounter for medication management, and Encounter for chronic pain management were pertinent to this visit.  Plan of Care  Assessment and Plan    Neuropathic pain   Neuropathic pain is managed  with pregabalin  25 mg at bedtime, providing some relief without adverse effects. Increase pregabalin  to 50 mg at bedtime. Send prescription for pregabalin  50 mg to [facility] in [place]. Instruct her to take pregabalin  once daily at bedtime for three weeks. After three weeks, if needed, consider taking pregabalin  twice daily on a weekend to assess tolerance. Set up a phone appointment to discuss further dose adjustments if needed.  Chronic pain requiring opioid therapy   Chronic pain is managed with oxycodone  IR 5 mg at night without adverse effects. She is unable to attend the scheduled appointment for medication refill due to caregiving responsibilities. Send a one-time prescription for oxycodone  IR 5 mg to the pharmacy. Reschedule the medication refill appointment to approximately 30 days from now.      Problem-specific:  No problem-specific Assessment & Plan notes found for this encounter.  Ms. ANN-MARIE KLUGE has a current medication list which includes the following long-term medication(s): albuterol, fluoxetine , fluticasone, linaclotide , montelukast, [START ON 06/24/2024] oxycodone , and pregabalin .  Pharmacotherapy (Medications Ordered): Meds ordered this encounter  Medications   pregabalin  (LYRICA ) 50 MG capsule    Sig: Take 1 capsule (50  mg total) by mouth 2 (two) times daily.    Dispense:  60 capsule    Refill:  0    Fill one day early if pharmacy is closed on scheduled refill date. May substitute for generic if available.   oxyCODONE  (OXY IR/ROXICODONE ) 5 MG immediate release tablet    Sig: Take 1 tablet (5 mg total) by mouth daily. Must last 90 days.    Dispense:  30 tablet    Refill:  0    DO NOT: delete (not duplicate); no partial-fill (will deny script to complete), no refill request (F/U required). DISPENSE: 1 day early if closed on fill date. WARN: No CNS-depressants within 8 hrs of med.   Orders:  No orders of the defined types were placed in this  encounter.  Follow-up plan:   Return in about 1 month (around 07/24/2024) for Eval-day (M,W), (Face2F), (MM), w/ Emmy Blanch, NP.      Interventional Therapies  Risk Factors  Considerations:   Mitral valve prolapse (MVP) GERD  urinary stress incontinence  GAD   Planned  Pending:   (05/26/2024) continue to titrate Lyrica  as tolerated.  Dose increased today from 25 mg at bedtime to 25 mg BID.   Under consideration:      Completed:   Diagnostic/therapeutic bilateral TBI x1 (04/22/2024) (100/100/40/40)  Therapeutic right cervical ESI x3 (03/11/2024) (100/100/100 x 7 days/100% for radicular  45% for axial)  Diagnostic bilateral GONB x1 (08/19/2019) (100/100/100/,50)  Palliative right cervical facet MBB x3 (07/23/2018) (100/100/100/>50)  Palliative left cervical facet MBB x4 (04/01/2019) (90/90/0/0)  Palliative right cervical facet RFA x1 (09/17/2018) (100/100/85/85)  Palliative left cervical facet RFA x1 (05/04/2019) (100/100/50/50)  Therapeutic left L1-2 LESI x1 (05/10/2021) (100/100/100 x 2 days/0)  Therapeutic right L1-2 LESI x2 (02/10/2018) (100/100/70/50-75)  Therapeutic right L1 TFESI x1 (02/10/2018) (100/100/70/50-75)  Diagnostic/therapeutic left superior cluneal NB (L2, L3 dorsal rami) x1 (04/24/2021) (0/0/0/0)  Diagnostic/therapeutic left L1 TFESI x2 (11/15/2021) (100/100/0)  Diagnostic/therapeutic left L3 TFESI x2 (11/15/2021) (100/100/0)  Bilateral lumbar spinal cord stimulator trial (done) (05/14/2018) (by me)  Bilateral lumbar spinal cord stimulator implant (09/28/2019) (by me)  Bilateral spinal cord stimulator revision (03/14/2020) (by me)  Therapeutic bilateral lumbar facet MBB R5L6 (12/02/2023) (100/100/80/80)  Diagnostic bilateral SI joint Blk x2 (09/18/2021) (100/100/100)  Palliative left lumbar facet RFA x3 (06/27/2022) (100/100/100/100)  Therapeutic right lumbar facet RFA x3 (05/21/2022) (100/100/75/75)    Therapeutic  Palliative (PRN) options:   Palliative right  cervical facet RFA   Diagnostic bilateral SI joint Blk   Therapeutic bilateral lumbar facet RFA    Pharmacotherapy  Nonopioids transfer 08/23/2020: Lidocaine  5% ointment and Robaxin .       Recent Visits Date Type Provider Dept  05/26/24 Office Visit Tanya Glisson, MD [facility]  05/06/24 Office Visit Tanya Glisson, MD [facility]  04/22/24 Procedure visit Tanya Glisson, MD [facility]  04/07/24 Office Visit Tanya Glisson, MD [facility]  Showing recent visits within past 90 days and meeting all other requirements Today's Visits Date Type Provider Dept  06/23/24 Office Visit Tanya Glisson, MD [facility]  Showing today's visits and meeting all other requirements Future Appointments Date Type Provider Dept  06/24/24 Appointment Patel, Seema K, NP [facility]  Showing future appointments within next 90 days and meeting all other requirements  I discussed the assessment and treatment plan with the patient. The patient was provided an opportunity to ask questions and all were answered. The patient agreed with the plan  and demonstrated an understanding of the instructions.  Patient advised to call back or seek an in-person evaluation if the symptoms or condition worsens.  Duration of encounter: 21 minutes.  Note by: Eric DELENA Como, MD Date: 06/23/2024; Time: 12:51 PM

## 2024-06-23 NOTE — Patient Instructions (Signed)
 ______________________________________________________________________    TENS (Device can be purchased online, without prescription. Search: TENS 7000.) Transcutaneous electrical nerve stimulation (TENS) is a method of pain relief that involves the use of mild electrical stimulation. A TENS machine is a small, battery-operated device that has leads connected to sticky pads called electrodes. Available at [employer]. (Estimated price as of July 10th, 2025.)  (Estimated [employer] cost: $38.88) Rechargeable 9V batteries:  (Estimated [employer] cost: $12.98)  Larger Reusable 2 x 4 TENS Pads/Electrodes:  (Estimated [employer] cost: $9.99)  Total cost: $61.85    ELECTRODE PLACEMENT:   TENS UNIT SAFETY WARNING SHEET and INFORMATION INDICATIONS AND CONTRAINDICTIONS Read the operation manual before using the device. Freight forwarder ([place] ) restricts this device to sale by or on the order of a physician. Observe your physician's precise instructions and let him show you where to apply the electrodes. For a successful therapy, the correct application of the electrodes is an important factor. Carefully write down the settings your physician recommended. Indications for use This device is a prescription device and only for symptomatic relief of chronic intractable pain.  Contraindications:   Any electrode placement that applies current to the carotid sinus (neck) region.   Patients with implanted electronic devices (for example, a pacemaker) or metallic implants should not undertake.   Any electrode placement that causes current to flow transcerebrally (through the head). The use of unit whenever pain symptoms are undiagnosed, unit etiology is determined.   The use of TENS whenever pain syndromes are undiagnosed, until etiology is established.   WARNINGS AND PRECAUTIONS  Warnings:   The device must be kept out of reach of children.   The safety of device for use during pregnancy or delivery has not been established.    Do not place electrodes on front of the throat. This may result in spasms of the laryngeal and pharyngeal muscles.   Do not place the electrodes over the carotid nerve (side of neck below ear).   The device is not effective for pain of central origin (headaches).   The device may interfere with electronic monitoring equipment (such as ECG monitors and ECG alarms).   Electrodes should not be placed over the eyes, in the mouth, or internally.   These devices have no curative value.   TENS devices should be used only under the continued supervision of a physician.   TENS is a symptomatic treatment and as such suppresses the sensation of pain which would otherwise serve as a protective mechanism. Precautions/Adverse Reactions   Isolated cases of skin irritation may occur at the site of electrode placement following long-term application.   Stimulation should be stopped and electrodes removed until the cause of the irritation can be determined.   Effectiveness is highly dependent upon patient selection by a person qualified in the management of pain patients.   If the device treatment becomes ineffective or unpleasant, stimulation should be discontinued until reevaluation by a physician/clinician.   Always turn the device off before applying or removing electrodes.   Skin irritation and electrode burns are potential adverse reactions.  PURPOSE: A Transcutaneous Electrical Nerve Stimulator, or TENS, unit is designed to relieve post-operative, acute and chronic pain. It is used for pain caused by peripheral nerves and not central. TENS units are prescription-only devices.   OPERATION: TENS units work in a couple of ways. The first way they are thought to work is by a method called the [employer]. The [employer] states that our brains can only handle one  stimulus at a time. When you have chronic pain, this pain signal is constantly being sent to your brain and recognized as pain. When an electrical stimulus is added  to the area of pain the body feels this electrical stimulus, and since the brain can only handle one thing at a time, the pain is not transmitted to the brain. The second method thought to be part of TENS unit's success is by way of stimulating our own bodies to release their own natural painkillers. TENS units do not work for everyone and results may vary. Always follow the instructions and warnings in your user's manual.   USE: One of the most important tasks that must be performed is battery maintenance. If you are using a [employer], always fully charge it and fully deplete it before charging it again. These batteries can develop memories and by not performing this charging task correctly, your battery's life can be greatly diminished. If your battery does develop a memory you can help expand the memory by charging for 12 - 13 hours and then completely depleting the battery. Always prepare the skin before applying electrodes. Your skin should be clean and free of any lotions or creams. If you are using electrodes that use conductive gel, apply a small, even layer over the electrode. For carbon, self-adhesive electrodes, apply a drop of water to the electrodes before applying to the skin. The electrodes attach to the lead wires and then the TENS unit. Always grasp the connector and not the cord when inserting or removing. When making adjustments, always make sure the unit's channels (1 and 2) are in the OFF position. The actual settings should be recommended and prescribed by your physician. Medical equipment suppliers don'tset or instruct users as to user settings. When you are using the BURST mode, the unit delivers a series of quick pulses followed by a rest. This cycle repeats itself frequently. Always have channels OFF before changing modes.   For MODULATION mode, the stimulation automatically varies the width of the pulse.   For CONVENTIONAL mode, the stimulation is constant.  After the settings have been fine-tuned, set the timer to 30 or 60 minutes. Your physician should also prescribe the use time. When the lights become dim, it means your batteries should be replaced or recharged.   ACCESSORIES: The electrodes and lead wires can be obtained from your medical equipment supplier. Your medical equipment supplier can set up a recurring delivery to accommodate your needs. Electrodes should be replaced once a month and lead wires once every 6 months.  Video Tutorial https://youtu.be/V_quvXRrlQE?[email or web address]  ______________________________________________________________________

## 2024-06-24 ENCOUNTER — Encounter: Admitting: Nurse Practitioner

## 2024-07-05 ENCOUNTER — Telehealth: Payer: Self-pay | Admitting: Pain Medicine

## 2024-07-05 ENCOUNTER — Other Ambulatory Visit: Payer: Self-pay | Admitting: *Deleted

## 2024-07-05 DIAGNOSIS — Z79891 Long term (current) use of opiate analgesic: Secondary | ICD-10-CM

## 2024-07-05 DIAGNOSIS — Z79899 Other long term (current) drug therapy: Secondary | ICD-10-CM

## 2024-07-05 DIAGNOSIS — G8929 Other chronic pain: Secondary | ICD-10-CM

## 2024-07-05 DIAGNOSIS — G894 Chronic pain syndrome: Secondary | ICD-10-CM

## 2024-07-05 DIAGNOSIS — M5481 Occipital neuralgia: Secondary | ICD-10-CM

## 2024-07-05 DIAGNOSIS — M47816 Spondylosis without myelopathy or radiculopathy, lumbar region: Secondary | ICD-10-CM

## 2024-07-05 MED ORDER — OXYCODONE HCL 5 MG PO TABS: 5.0000 mg | ORAL_TABLET | Freq: Every day | ORAL | 0 refills | Status: DC

## 2024-07-05 NOTE — Telephone Encounter (Signed)
 PT  called stated that pharmacy is confused about Oxycodone  prescription. PT stated that the instructions stated she will get 30 capable that's suppose to last her 90 day. Please give patient pharmacy a call.TY

## 2024-07-05 NOTE — Telephone Encounter (Signed)
 Spoke with pharmacy and I have sent a note to Seema to resend Rx.  Was supposed to be for 30 day supply, sig states must last 90 days.

## 2024-07-21 ENCOUNTER — Ambulatory Visit: Attending: Nurse Practitioner | Admitting: Nurse Practitioner

## 2024-07-21 ENCOUNTER — Encounter: Payer: Self-pay | Admitting: Nurse Practitioner

## 2024-07-21 VITALS — BP 128/74 | HR 76 | Temp 98.2°F | Ht 60.0 in | Wt 147.0 lb

## 2024-07-21 DIAGNOSIS — M5442 Lumbago with sciatica, left side: Secondary | ICD-10-CM | POA: Diagnosis present

## 2024-07-21 DIAGNOSIS — M533 Sacrococcygeal disorders, not elsewhere classified: Secondary | ICD-10-CM | POA: Diagnosis present

## 2024-07-21 DIAGNOSIS — M25552 Pain in left hip: Secondary | ICD-10-CM | POA: Insufficient documentation

## 2024-07-21 DIAGNOSIS — G8929 Other chronic pain: Secondary | ICD-10-CM | POA: Insufficient documentation

## 2024-07-21 DIAGNOSIS — M5481 Occipital neuralgia: Secondary | ICD-10-CM | POA: Insufficient documentation

## 2024-07-21 DIAGNOSIS — G894 Chronic pain syndrome: Secondary | ICD-10-CM | POA: Diagnosis present

## 2024-07-21 DIAGNOSIS — Z79891 Long term (current) use of opiate analgesic: Secondary | ICD-10-CM | POA: Insufficient documentation

## 2024-07-21 DIAGNOSIS — M25551 Pain in right hip: Secondary | ICD-10-CM | POA: Insufficient documentation

## 2024-07-21 DIAGNOSIS — M5441 Lumbago with sciatica, right side: Secondary | ICD-10-CM | POA: Insufficient documentation

## 2024-07-21 DIAGNOSIS — M79604 Pain in right leg: Secondary | ICD-10-CM | POA: Diagnosis present

## 2024-07-21 DIAGNOSIS — M7061 Trochanteric bursitis, right hip: Secondary | ICD-10-CM | POA: Insufficient documentation

## 2024-07-21 DIAGNOSIS — Z79899 Other long term (current) drug therapy: Secondary | ICD-10-CM | POA: Diagnosis present

## 2024-07-21 DIAGNOSIS — M542 Cervicalgia: Secondary | ICD-10-CM | POA: Insufficient documentation

## 2024-07-21 DIAGNOSIS — M47816 Spondylosis without myelopathy or radiculopathy, lumbar region: Secondary | ICD-10-CM | POA: Diagnosis present

## 2024-07-21 DIAGNOSIS — M79605 Pain in left leg: Secondary | ICD-10-CM | POA: Insufficient documentation

## 2024-07-21 MED ORDER — OXYCODONE HCL 5 MG PO TABS: 5.0000 mg | ORAL_TABLET | Freq: Every day | ORAL | 0 refills | Status: DC

## 2024-07-21 NOTE — Progress Notes (Unsigned)
 Safety precautions to be maintained throughout the outpatient stay will include: orient to surroundings, keep bed in low position, maintain call bell within reach at all times, provide assistance with transfer out of bed and ambulation.   Nursing Pain Medication Assessment:  Safety precautions to be maintained throughout the outpatient stay will include: orient to surroundings, keep bed in low position, maintain call bell within reach at all times, provide assistance with transfer out of bed and ambulation.  Medication Inspection Compliance: Pill count conducted under aseptic conditions, in front of the patient. Neither the pills nor the bottle was removed from the patient's sight at any time. Once count was completed pills were immediately returned to the patient in their original bottle.  Medication: Oxycodone  IR Pill/Patch Count: 18 of 30 pills/patches remain Pill/Patch Appearance: Markings consistent with prescribed medication Bottle Appearance: Standard pharmacy container. Clearly labeled. Filled Date: 34 / 8 / 2025 Last Medication intake:  Yesterday

## 2024-07-21 NOTE — Patient Instructions (Signed)
 ______________________________________________________________________    Procedure instructions  Stop blood-thinners  Do not eat or drink fluids (other than water ) for 6 hours before your procedure  No water  for 2 hours before your procedure  Take your blood pressure medicine with a sip of water   Arrive 30 minutes before your appointment  If sedation is planned, bring suitable driver. Nada, Gisele, & public transportation are NOT APPROVED)  Carefully read the Preparing for your procedure detailed instructions  ______________________________________________________________________    Preparing for your procedure  Appointments: If you think you may not be able to keep your appointment, call 24-48 hours in advance to cancel. We need time to make it available to others.  Procedure visits are for procedures only. During your procedure appointment there will be: NO Prescription Refills*. NO medication changes or discussions*. NO discussion of disability issues*. NO unrelated pain problem evaluations*. NO evaluations to order other pain procedures*. *These will be addressed at a separate and distinct evaluation encounter on the provider's evaluation schedule and not during procedure days.  Instructions: Food intake: Avoid eating anything solid for at least 8 hours prior to your procedure. Clear liquid intake: You may take clear liquids such as water  up to 2 hours prior to your procedure. (No carbonated drinks. No soda.) Transportation: Unless otherwise stated by your physician, bring a driver. (Driver cannot be a Market researcher, Pharmacist, community, or any other form of public transportation.) Morning Medicines: Except for blood thinners, take all of your other morning medications with a sip of water . Make sure to take your heart and blood pressure medicines. If your blood pressure's lower number is above 100, the case will be rescheduled. Blood thinners: Make sure to stop your blood thinners as  instructed.  If you take a blood thinner, but were not instructed to stop it, call our office [phone] and ask to talk to a nurse. Not stopping a blood thinner prior to certain procedures could lead to serious complications. Diabetics on insulin: Notify the staff so that you can be scheduled 1st case in the morning. If your diabetes requires high dose insulin, take only  of your normal insulin dose the morning of the procedure and notify the staff that you have done so. Preventing infections: Shower with an antibacterial soap the morning of your procedure.  Build-up your immune system: Take 1000 mg of Vitamin C with every meal (3 times a day) the day prior to your procedure. Antibiotics: Inform the nursing staff if you are taking any antibiotics or if you have any conditions that may require antibiotics prior to procedures. (Example: recent joint implants)   Pregnancy: If you are pregnant make sure to notify the nursing staff. Not doing so may result in injury to the fetus, including death.  Sickness: If you have a cold, fever, or any active infections, call and cancel or reschedule your procedure. Receiving steroids while having an infection may result in complications. Arrival: You must be in the facility at least 30 minutes prior to your scheduled procedure. Tardiness: Your scheduled time is also the cutoff time. If you do not arrive at least 15 minutes prior to your procedure, you will be rescheduled.  Children: Do not bring any children with you. Make arrangements to keep them home. Dress appropriately: There is always a possibility that your clothing may get soiled. Avoid long dresses. Valuables: Do not bring any jewelry or valuables.  Reasons to call and reschedule or cancel your procedure: (Following these recommendations will minimize the risk of a  serious complication.) Surgeries: Avoid having procedures within 2 weeks of any surgery. (Avoid for 2 weeks before or after any  surgery). Flu Shots: Avoid having procedures within 2 weeks of a flu shots or . (Avoid for 2 weeks before or after immunizations). Barium: Avoid having a procedure within 7-10 days after having had a radiological study involving the use of radiological contrast. (Myelograms, Barium swallow or enema study). Heart attacks: Avoid any elective procedures or surgeries for the initial 6 months after a Myocardial Infarction (Heart Attack). Blood thinners: It is imperative that you stop these medications before procedures. Let us  know if you if you take any blood thinner.  Infection: Avoid procedures during or within two weeks of an infection (including chest colds or gastrointestinal problems). Symptoms associated with infections include: Localized redness, fever, chills, night sweats or profuse sweating, burning sensation when voiding, cough, congestion, stuffiness, runny nose, sore throat, diarrhea, nausea, vomiting, cold or Flu symptoms, recent or current infections. It is specially important if the infection is over the area that we intend to treat. Heart and lung problems: Symptoms that may suggest an active cardiopulmonary problem include: cough, chest pain, breathing difficulties or shortness of breath, dizziness, ankle swelling, uncontrolled high or unusually low blood pressure, and/or palpitations. If you are experiencing any of these symptoms, cancel your procedure and contact your primary care physician for an evaluation.  Remember:  Regular Business hours are:  Monday to Thursday 8:00 AM to 4:00 PM  Provider's Schedule: Eric Como, MD:  Procedure days: Tuesday and Thursday 7:30 AM to 4:00 PM  Wallie Sherry, MD:  Procedure days: Monday and Wednesday 7:30 AM to 4:00 PM Last  Updated: 10/07/2023 ______________________________________________________________________     ______________________________________________________________________    Blood Thinners  IMPORTANT NOTICE:  If  you take any of these, make sure to notify the nursing staff.  Failure to do so may result in serious injury.  Recommended time intervals to stop and restart blood-thinners, before & after invasive procedures  Generic Name Brand Name Pre-procedure: Stop medication for this amount of time before your procedure: Post-procedure: Wait this amount of time after the procedure before restarting your medication:  Abciximab Reopro 15 days 2 hrs  Alteplase Activase 10 days 10 days  Anagrelide Agrylin    Apixaban Eliquis 3 days 6 hrs  Cilostazol Pletal 3 days 5 hrs  Clopidogrel Plavix 7-10 days 2 hrs  Dabigatran Pradaxa 5 days 6 hrs  Dalteparin Fragmin 24 hours 4 hrs  Dipyridamole Aggrenox 11days 2 hrs  Edoxaban Lixiana; Savaysa 3 days 2 hrs  Enoxaparin  Lovenox 24 hours 4 hrs  Eptifibatide Integrillin 8 hours 2 hrs  Fondaparinux  Arixtra 72 hours 12 hrs  Hydroxychloroquine Plaquenil 11 days   Prasugrel Effient 7-10 days 6 hrs  Reteplase Retavase 10 days 10 days  Rivaroxaban Xarelto 3 days 6 hrs  Ticagrelor Brilinta 5-7 days 6 hrs  Ticlopidine Ticlid 10-14 days 2 hrs  Tinzaparin Innohep 24 hours 4 hrs  Tirofiban Aggrastat 8 hours 2 hrs  Warfarin Coumadin 5 days 2 hrs   Other medications with blood-thinning effects  NOTE: Consider stopping these if you have prolonged bleeding despite not taking any of the above blood thinners. Otherwise ask your provider and this will be decided on a case-by-case basis.  Product indications Generic (Brand) names Note  Cholesterol Lipitor Stop 4 days before procedure  Blood thinner (injectable) Heparin (LMW or LMWH Heparin) Stop 24 hours before procedure  Cancer Ibrutinib (Imbruvica) Stop 7 days before procedure  Malaria/Rheumatoid Hydroxychloroquine (Plaquenil) Stop 11 days before procedure  Thrombolytics  10 days before or after procedures   Over-the-counter (OTC) Products with blood-thinning effects  NOTE: Consider stopping these if you have prolonged  bleeding despite not taking any of the above blood thinners. Otherwise ask your provider and this will be decided on a case-by-case basis.  Product Common names Stop Time  Aspirin > 325 mg Goody Powders, Excedrin, etc. 11 days  Aspirin <= 81 mg  7 days  Fish oil  4 days  Garlic supplements  7 days  Ginkgo biloba  36 hours  Ginseng  24 hours  NSAIDs Ibuprofen , Naprosyn , etc. 3 days  Vitamin E  4 days   ______________________________________________________________________     ______________________________________________________________________    Update on Controlled Substance (Opioid) Regulations   To: All patients taking opioid pain medications. (I.e.: hydrocodone , hydromorphone , oxycodone , oxymorphone, morphine, codeine, methadone, tapentadol, tramadol, buprenorphine, fentanyl , etc.)  Re: Review on the state of controlled substance regulations.  Introduction: Rules and regulations associated with all aspects of controlled substances are constantly being modified. Unfortunately we have encountered patients questioning the veracity of the information that we provide them about these changes. This is intended to provide them with appropriate references and a historical review of these changes.  A Brief History: As of August 12, 2016, the [place]  Government declared the opioid epidemic a public health emergency. Prescription drug monitoring programs (PDMPs) and the [facility] All Schedules Prescription Electronic Reporting Act (NASPER). Before 1800, clinicians regarded pain as an existential phenomenon, a consequence of aging. There was no regulation on the use of cocaine and opioids, resulting in widespread marketing and prescribing for many ailments ranging from diarrhea to toothache. The [employer] of [id number], passed in response to the sudden emergence of street heroin abuse as well as iatrogenic morphine dependence, influenced both physician and patient alike to avoid opiates.  Patients with unexplained pain in the 1920s were regarded as deluded, malingering, or abusers, and cancer patients through the 1950s were encouraged to wean themselves off opioids until their lives "could be measured in weeks". Alongside this opioid evolution, the American Pain Society launched their influential "pain as the fifth vital sign" campaign in 1995. Concurrently, pharmaceutical companies introduced new formulations, such as extended release oxycodone  (OxyContin ). From 1997 to 2002, OxyContin  prescriptions increased from 670,000 to 6.2 million. However, concerns soon began to surface regarding overzealous opioid treatment. It must be noted that pharmaceutical companies contributed significantly to the rise of the opioid epidemic, receiving considerable reprimands as a consequence. In 2007, as the opioid epidemic began to inflict profound damage, [employer] pleaded guilty to federal charges related to the misbranding of OxyContin . [employer] agreed to pay a total of $634.5 million to resolve Justice Department investigations, as well as a $19.5 million settlement to [address] and the [address] of [place].  In response to the current epidemic, changes in focus to the development of new abuse deterrent opioid formulations at the USAA and Drug Administration (FDA) as well as drafting of new public standards for pain treatment were created at [facility] in 2017. In response to the opioid epidemic, FDA public policy changes were announced in February 2016. Among these new positions were a re-examination of the risk-benefit paradigm for opioids with strict emphasis on the large public health ramifications. The various modified opioids released over the past 20 years, such as tamper-resistant preparation, have had differing levels of success, and are collectively referred to as Risk Evaluation and Mitigation  Strategies (REMS). There is also a growing focus on preventing opioid use disorder (OUD) and on offering  affected individuals accessible and effective treatment. [place]  government policy reflects these changes and both the Affordable Care Act and the Mental Health Parity and Addiction Equity Act were major steps forward in treating opioid addiction. The Affordable Care Act, which was signed into law in 08/20/2009, with major provisions coming into effect by 2013-08-20.  In the 1990s, the intensified marketing of newly reformulated prescription opioid medications (e.g., OxyContin ) and an influential pain advocacy campaign that encouraged greater pain management led to a precipitous rise in opioid use in the [place] . Research from the Centers for Disease Control and Prevention (CDC) shows that prescription opioid sales in the [place]  quadrupled from August 20, 1998 to 20-Aug-2009. At the same time, opioid misuse and opioid-involved overdose deaths increased (Figure 1). Between 1998-08-20 and 08/20/09, the rate of opioidinvolved overdose deaths in the [place]  doubled from 2.9 to 6.8 deaths per 100,000 people. This initial rise in opioid-related deaths is often referred to as the first wave of the recent opioid crisis.  Between 1998-08-20 and 08/21/2019, 565,000 Americans died of opioid-involved overdoses. In turn, federal, state, and local governments responded with various legal and policy efforts to curb opioid misuse and drug-related overdose Deaths.  Recent Congresses have enacted several laws addressing the opioid crisis, such as the Comprehensive Addiction and Recovery Act of 21-Aug-2015 (CARA, P.L. 3017551153); the 21st Century Cures Act (P.L. 114-255); the Substance UseDisorder Prevention that Promotes Opioid Recovery and Treatment for Patients and Communities Act (SUPPORT Act, P.L. 403-452-1208); the Fentanyl  Sanctions Act (Title LXXII of P.L. [id number]); and the Blocking Deadly Fentanyl  Imports Act (P.L. 117-81, 6610). These laws addressed overprescribing and misuse of opioids, expanded substance use disorder prevention and treatment  capacities, bolstered drug diversion capabilities, and enhanced international drug interdiction, counternarcotics cooperation, and sanctions efforts. Congress also directed additional funds to many of these initiatives through appropriations.  Congress provided funding in the [employer] Act of 2020/08/20 [phone]; P.L. 117-2) for syringe services programs (often known as needle exchange programs) and other harm reduction initiatives. Federal and state harm reduction strategies have frequently involved the distribution of naloxone  (e.g., Narcan )--a medication used to reverse an opioid overdose--and test strips used to detect fentanyl  in drug samples.  The Department of Justice (DOJ) and Department of Homeland Security [facility]) aim to reduce the diversion of prescription opioids and the use, manufacturing, and trafficking of illicit opioids. DOJ--via the Drug Enforcement Administration (DEA)--regulates opioid manufacturers, distributors, and dispensers; it also controls the opioid supply through enforcement of regulatory requirements.  A History of Opiate Laws in the [place]   Prior to 1889-08-20, laws concerning opiates were strictly imposed on a local city or state-by-state basis. One of the first was in [place] in 1874-08-20 where it became illegal to smoke opium only in opium dens. It did not ban the sale, import or use otherwise. In the next 25 years different states enacted opium laws ranging from outlawing opium dens altogether to making possession of opium, morphine and heroin without a physician's prescription illegal.  The first Congressional Act took place in 1889/08/20 that levied taxes on morphine and opium. From that time on the [employer] has had a series of laws and acts directly aimed at opiate use, abuse and control. These are outlined below:  1906 - Pure Food and Drug Act Preventing the manufacture, sale, or transportation of adulterated or misbranded or poisonous or  deleterious foods,  drugs, medicines, and liquors, and for regulating traffic therein, and for other purposes. Punishment included fines and prison time.  1909   - Smoking Opium Exclusion Act Banned the importation, possession and use of smoking opium. Did not regulate opium-based medications. First Freight forwarder banning the non-medical use of a substance.  1914  - The Margrette Act In summary, The Margrette Act of 1914 was written more to have all parties involved in importing, exporting, Set designer and distributing opium or cocaine to register with the [employer] and have taxes levied upon them. Exempt from the law were physicians operating "in the course of his professional practice"  1919 - Supreme Court ratified the [employer] in Morgan et al., v. [place]  and [place]  v. [place], then again in [facility]. [place] , in 1920, holding that doctors may not prescribe maintenance supplies of narcotics to people addicted to narcotics. However, it does not prohibit doctors from prescribing narcotics to wean a patient off of the drug. It was also the opinion of the court that prescribing narcotics to habitual users was not considered "professional practice" hence it then was considered illegal for doctors to prescribe opioids for the purposes of maintaining an addiction. It can be argued that today's addiction medications are not intended to maintain an addiction but to facilitate addiction remission. In which case, this opinion of the court should not preclude practitioners from prescribing buprenorphine or methadone to patients suffering from an addictive disorder.  1924  - Heroin Act Architectural technologist, importation and possession of heroin illegal - even for medicinal use.  1922 -- Narcotic Drug Import and Export Act Enacted to assure proper control of importation, sale, possession, production and consumption of narcotics.  1927  -- Special educational needs teacher of  Prohibition [employer] of Prohibition was responsible for tracking bootleggers and organized Conservation officer, historic buildings. They focused primarily on interstate and international cases and those cases where local law enforcement official would not or could not act.  1932 -- Uniform State Narcotic Act Encouraged states to pass uniform state laws matching the federal Narcotic Drug Import and Export Act. Suggested prohibiting cannabis use at the state level.  20 -- Food, Drug, and Cosmetic Act The new law brought cosmetics and medical devices under control, and it required that drugs be labeled with adequate directions for safe use. Moreover, it mandated pre-market approval of all new drugs, such that a manufacturer would have to prove to FDA that a drug were safe before it could be sold  1951 -- Boggs Act Imposed maximum criminal penalties for violations of the import/export and internal revenue laws related to drugs and also established mandatory minimum prison sentences.  1956 -- Narcotics Control Act Increased Boggs Act penalties and mandatory prison sentence minimums for violations of existing drug laws.  1965 -- Drug Abuse Control Amendment Enacted to deal with problems caused by abuse of depressants, stimulants and hallucinogens. Restricted research into psychoactive drugs such as LSD by requiring FDA approval.  1970 -- Controlled Substance Act  Controlled Substances Import and Export Act These laws are a consolidation of numerous laws regulating the manufacture and distribution of narcotics, stimulants, depressants, hallucinogens, anabolic steroids, and chemicals used in the illicit production of controlled substances. The CSA places all substances that are regulated under existing federal law into one of five schedules. This placement is based upon the substance's medicinal value, harmfulness, and potential for abuse or addiction. Schedule I is reserved for the most dangerous drugs that have no  recognized medical use, while Schedule V is the classification used for the least dangerous drugs. The act also provides a mechanism for substances to be controlled, added to a schedule, decontrolled, removed from control, rescheduled, or transferred from one schedule to another.  30 - Drug Enforcement Agency By Executive Order, the DEA was formed to take place of the [employer] of Narcotics and Dangerous Drugs.  60 -Narcotic Addict Treatment Act of  1974  - Public Law [phone] Amends the Controlled Substance Act of 1970 to provide for the registration of practitioners conducting narcotic treatment programs. [methadone clinics] It also provides legal definitions for the phrases "maintenance treatment" and "detoxification treatment".  1986 -- Anti-Drug Abuse Act of 1986 Strengthened Federal efforts to encourage foreign cooperation in eradicating illicit drug crops and in halting international drug traffic, to improve enforcement of Federal drug laws and enhance interdiction of illicit drug shipments, to provide strong Market researcher in establishing effective drug abuse prevention and education programs, to [employer] support for drug abuse treatment and rehabilitation efforts, and for other purposes. It also re-imposed mandatory sentencing minimums depending on which drug and how much was involved.  1988 -- Anti-Drug Abuse Act of 1988 Established the Office of Materials engineer (ONDCP) in the [employer] of the Economist; authorized funds for [employer], state and local drug enforcement activities, school-based drug prevention efforts, and drug abuse treatment with special emphasis on injecting drug abusers at high risk for AIDS.  2000 -- Federal - The Drug Addiction Treatment Act of 2000 (DATA 2000) It enables qualified physicians to prescribe and/or dispense narcotics for the purpose of treating opioid dependency. For the first time, physicians are able to treat this disease from  their private offices or other clinical settings. This presents a very desirable treatment option for those who are unwilling or unable to seek help in drug treatment clinics. Patients can now be treated in the privacy of their doctor's office, as are other people being treated for any other type of medical condition. One medicine doctors may now prescribe is Buprenorphine. The major downfall of this Act is the limitation of 30 patients per practice - which means that large facilities, no matter how many physicians are there, can only treat 30 patients at a time.  2002-- DEA reschedules buprenorphine from a schedule V drug to a schedule III drug, on August 03, 2001 - the day before the FDA approval of Suboxone and Subutex despite overwhelming objection by the medical community.  2004: June 2004 THE CONFIDENTIALITY OF ALCOHOL AND DRUG ABUSE PATIENT RECORDS REGULATION AND THE HIPAA PRIVACY RULE:  Confidentiality of Alcohol and Drug Dependence Patient Records (summary) Code of Federal Regulations Title 42 Part 2 (42 CFR Part 2)  The confidentiality of alcohol and drug dependence patient records maintained by this practice/program is protected by federal law and regulations. Generally, the practice/program may not say to a person outside the practice/program that a patient attends the practice/program, or disclose any information identifying a patient as being alcohol or drug dependent unless:  The patient consents in writing; The disclosure is allowed by a court order, or The disclosure is made to medical personnel in a medical emergency or to qualified personnel for research,  audit, or practice/program evaluation. Violation of the federal law and regulations by a practice/program is a crime. Suspected violations may be reported to appropriate authorities in accordance with federal regulations. Freight forwarder and regulations do not protect any information about a crime committed by a patient  either at the  practice/program or against any person who works for the practice/program or about any threat to commit such a crime. Federal laws and regulations do not protect any information about suspected child abuse or neglect from being reported under state law to appropriate state or local authorities.  sample consent form (MS-WORD)  2005: 05-29-2004 Public law [phone], Amends the Controlled Substances Act to eliminate the 30-patient limit for medical group practices allowed to dispense narcotic drugs in schedules III, IV, or V for maintenance or detoxification treatment (retains the 30-patient limit for an individual physician). This amendment removes the 30-patient limit on group medical practices that treat opioid dependence with buprenorphine. The restriction was part of the original Drug Addiction Treatment Act of 2000 (DATA) that allowed treatment of opioid dependence in a doctor's office. With this change, every certified doctor may now prescribe buprenorphine up to his or her individual physician limit of 30 patients.  2006: On 10/25/2005 President Levy signed Bill H.R.6344 into law. This allows physicians who have been certified to prescribe certain drugs for the treatment of opioid dependence under DATA2000 to treat up to 100 patients (up from 30) by submitting an intent notification to the Dept of Health and [employer]. This is a major step forward in both fighting the stigma and allowing access to treatment previously not available to some. For more details see 30/100-PATIENT LIMIT  2016: HHS augments regulations concerning the 30/100 patient limit by raising the limit to 275 for qualifying physicians. Link to summary of regulation  2016: Comprehensive Addiction and Recovery Act of 2016 (sec.303) amends the Controlled Substance Act - to allow Nurse Practitioners and Physician Assistants to become eligible to prescribe buprenorphine for the treatment of opioid use disorder. See the entire law for  more details.  The roots of the concurrent regulation of certain drugs under two statutory schemes go back to the beginning of this century. In 1906, Congress enacted the Pure Food and Drug Act, establishing one regime of regulation to assure (among other things) that drugs were not adulterated or misbranded. These regulations were amended several times, recodified in 1938, and expanded on again from the 1940s through the 1990s. Their implementation and enforcement is today assigned to the Food and Drug Administration (FDA) in the Department of Health and Human Services [facility]).  In 1914, Congress adopted the [place] Narcotic Act to stop abuse of addictive drugs. The [place] Narcotic Act was amended in 1937 to include marijuana. In 1965, amphetamines, barbiturates, and hallucinogens came under regulation, but under the [employer], Drug, and Cosmetic Act. In 1970, these various statutes were consolidated and recodified as the Controlled Substances Act (CSA), which has been amended several times since then. Its implementation and enforcement is today assigned to the Drug Enforcement Administration (DEA) in the Department of Justice.  The first clash occurred after World War I, when so-called morphine clinics existed and physicians prescribed or dispensed morphine to addicts. Some addicts were veterans of the American Civil War, the Spanish-American War, and WWI, who had become addicted during treatment for war wounds, but most of them came from the growing population of nonmedical addicts ([place], 8017). The Narcotics Division of the Prohibition Unit of the Department of the Treasury, which was then responsible for enforcing the [facility] Narcotic Act, concluded that this activity was not the legitimate practice of medicine but simple drug trafficking. The Treasury Department swiftly closed the clinics and made it personally and professionally risky for physicians to maintain a narcotic  addict for any  reason. In did so, however, only after the American Medical Association had adopted a resolution, in 1920, opposing ambulatory clinics''.  In 1972, the public health establishment, including the Secretary of Health, Education, and Welfare, the Education officer, environmental, the [employer] of [employer], and the Chemical engineer for Drug Abuse Prevention, was unprepared to allow [employer] of Narcotics and Dangerous Drugs, DEA's predecessor agency, to unilaterally define the parameters of medical practice for the use of methadone in the treatment of heroin addiction. As a consequence, a new set of rules--the third, on top of the FDA and DEA schemes--was added, one that inserted FDA deeply into the practice of medicine, notwithstanding its protestations to the contrary. Congress ratified this joint responsibility of law enforcement and public health officials for methadone through this third set of rules in 1974 with the passage of the Narcotic Addict Treatment Act (NATA). To examine in detail the evolution of this third set of rules--commonly referred to as the FDA or DHHS methadone regulations--we turn, first, to the period of the mid-1960s.  Increased use of heroin in the post World War II period first became apparent in the early to mid 1950s. During the [employer], a minimum mandatory narcotics law was enacted in 1956, effective July 1957. 1962 [facility] conference on drug abuse, the [employer] on Narcotic and Drug Abuse (the [employer]) of 1963, the Drug Abuse Control Amendments of 1965, the President's Commission on [employer] and Administration of Justice (the [employer]) of [phone], and the Narcotic Addiction Rehabilitation Act of 1966.  The 1965 Drug Abuse Control Amendments brought under strict federal control all nonnarcotic drugs capable of producing serious psychotoxic effects when abused. This act also created the  [employer] of Drug Abuse Control within the Department of Health, Education, and Welfare (DHEW) and shifted the basis for [employer] of illegal drugs from tax principles (administered by the Department of Treasury) to the regulation of commerce (administered by the [employer]).  The 1966 Narcotic Addiction Rehabilitation Act Tour manager) authorized the civil commitment of narcotic addicts, and federal assistance to state and local governments to develop a local system of drug treatment programs. With respect to the latter, the [employer] of Mental Health [facility]) initially proposed the gradual implementation of the state assistance effort, mainly through a common mental health mechanism--inpatient treatment programs. However, because of a perceived pressing need, the courts began to commit addicts to these programs even before they were officially opened or staffed. The NARA legislation imposed the following contract requirements on treatment centers: (1) thrice-a-week counseling sessions; (2) weekly urine tests; (3) restorative dental services; (4) psychological consultations and vocational training; and (5) the treatment modalities of drug-free outpatient, therapeutic community, and methadone maintenance. Reorganization Plan No. 1 of 1968 transferred the primary functions of the [employer] of Narcotics (FBN) from the [employer] to the Department of Justice; it also transferred the [employer] of Drug Abuse Control functions to the Department of Justice. Within the [employer], the [employer] of Narcotics and Dangerous Drugs (BNDD) was created, which became the Drug Enforcement Administration in 1973.   Under the first Van Voorhis administration [phone]), federal drug abuse policy developed in a significant way. These developments included a 1969 war on drugs presidential message, resulting legislation in 1970, and a Special Action Office created by executive order in 1971 and authorized in  statute in 1972. Brynn, in 1969, to send a message to Congress on drug  abuse. Although this was the first time that a U.S. president invoked the war on drugs image, it was in retrospect the most balanced approach to the problem of drug abuse that had been advanced. The 1969 message resulted in the submission of legislation to the Congress and the passage, the following year, of the Comprehensive Drug Abuse Prevention and Control Act of 1970 [employer] [phone], August 23, 1969). The act dealt with research, treatment, and prevention of drug abuse and drug dependence, and with drug abuse Charity fundraiser. One major purpose of the 1970 legislation was to reverse some of the strictures of the [employer] of 1914. The 1970 act sought to clarify for the medical profession . . . the extent to which they may safely go in treating narcotic addicts as patients. Title I, in Section IV, charged the Surveyor, minerals, Education, and Welfare, to determine the appropriate methods of professional practice in the medical treatment of the narcotic addiction of various classes of narcotic addicts. This provision constitutes the initial statutory basis for treatment standards. The law enforcement sections consolidated all prior federal statutes into the Controlled Substances Act and the Controlled Substances Export and Import Act (Titles II and III, respectively, of the Comprehensive Drug Abuse Prevention and Control Act of 1970). Under this legislation, substances were classified under five schedules according to their abuse potential, and psychological and physical effects. Methadone was placed in Schedule II, along with such opiate drugs as morphine, codeine, and hydrocodone .  One of the most important steps taken by President Brynn was to establish in June 1971 the Special Action Office for Drug Abuse Prevention (SAODAP) in the [employer] of the President ([place] [phone], April 13, 1970). In  mid-1971, [facility] appointed Dr. Maple Dunnings as SAODAP director. Within a year, the Drug Abuse Prevention Office and Treatment Act of 1972 [employer] [phone], January 16, 1971) gave statutory authority to [facility], but limiting setting, on April 26, 1974, as the limit on its existence.  The purpose of the 1972 act was to bring the resources of the federal government to bear on drug abuse with the immediate objective of significantly reducing its incidence and developing a comprehensive, coordinated long-term federal strategy to combat drug abuse.  Narcotic Addict Treatment Act [employer]) of 1974 [employer] [phone]), which amended the Controlled Substances Act. This legislation was driven by concern for the diversion of methadone to illicit channels that was occurring in 1972 and 1973, as reflected in the title of the Senate bill adopted on April 04, 1972, the Methadone Diversion Control Act of 1973. (U.S. Senate, 1970a, 8029a).  The 1980 final rule (45 FR 37305, July 17, 1979) reduced the minimum standard for admission from two years of addiction to one year coupled with a clinical determination that the individual was currently physiologically.  The regulations were next revised in 1989, following two proposals to modify them, one in 1983 and one in 1987.  Under President Tanda Corrente, a government-wide effort was made to review all federal government regulations and to eliminate or reduce the burden of these regulations on the private sector, state and [employer], and [employer].   The 1983 recommendations, though not adopted, did initiate another revision of the methadone regulations, which first found expression in a 1987 proposed rule (52 FR 37047, July 29, 1986) and culminated in a final rule (54 FR 8954, December 28, 1987) at the end of the decade. In the 1987 proposed rule, the FDA and  NIDA, in an effort to put the best face on the unenthusiastic 1983 response by the provider community  to converting the regulations to guidelines, indicated that they had retained the current requirements necessary to achieve the goals of the 1974 NATA, but were proposing to streamline the regulation and to promote more efficient operation of methadone programs. The 1987 proposed rule, issued by the FDA and NIDA, advanced the following changes in the methadone regulation: that detoxification treatment be divided into short-term (<21 days) and long-term (>21 and <180 days) treatment; that the minimum staffing ratio of one counselor to 50 patients be eliminated; that blood tests be allowed as ways to conduct initial drug screening or to meet the monthly testing requirements for six-day take-home patients; that the 72-hour notification of FDA and the pertinent state authority for methadone doses greater than 100 mg be eliminated; that special adverse reaction reporting requirements for methadone be eliminated and reliance placed upon general FDA reporting requirements; that a supervising counselor be allowed to conduct the annual review of the patient's treatment plan for certain qualified patients who had been in treatment for 3 years or longer; and that the requirement of an annual report of methadone treatment programs to the FDA be dropped. The FDA and NIDA issued a final rule on December 28, 1987, based on comments on the 1987 proposed (54 FR 8954). Concurrently, FDA and NIDA issued a six-page guidance document, which noted that the regulations, over time, had recommended certain practices that were not actually required. Public Health Service, in Congress, and elsewhere, to reorganize the Alcohol, Drug Abuse, and Mental Health Administration (ADAMHA). These efforts culminated in the [employer] of 1992 [employer] [phone], May 07, 1991), the main purpose of which was to transfer the research portions of the three ADAMHA institutes--NIDA, the General Mills of Alcoholism and Alcohol Abuse,  and the [employer] Mills of Mental Health--to the [employer] and to create the Substance Abuse and Museum/gallery exhibitions officer [facility]) as the home for the service functions of these entitles.  Guidelines for Opioid Treatment The Federal Guidelines for Opioid Treatment Programs - 2015 serve as a guide to accrediting organizations for developing accreditation standards. The guidelines also provide OTPs with information on how programs can achieve and maintain compliance with federal regulations. The 2015 guidelines are an update to the 2007 Guidelines for the Accreditation of Opioid Treatment Programs (PDF  547 KB). The new document reflects the obligation of OTPs to deliver care consistent with the patient-centered, integrated, and recovery-oriented standards of substance use treatment.  DPT oversees the certification of OTPs and provides guidance to nonprofit organizations and state governmental entities that want to become a SAMHSA-approved accrediting body. Learn more about the accreditation and certification of OTPs and [employer] oversight of OTP accreditation bodies.  Model Guidelines for [facility] Boards With input from [facility], the Federation of [employer] in 2013 adopted a revised version of the federation's office-based opioid treatment policies. The Model Policy on DATA 2000 and Treatment of Opioid Addiction in the Medical Office - 2013 (PDF  279 KB) provides model guidelines for use by state medical boards in regulating office-based opioid treatment.  Holiday Guidance for Opioid Treatment Programs (PDF  203 KB) In response to requests for the upcoming federal holidays and ensuing weekends (December 24th, 25th, and 26th and December 31st, Jan 1st, and Jan 2nd), this letter is to provide guidance regarding requests for unsupervised doses of medication for patients for these dates.  View a sample SMA-168 (PDF  194 KB).  Federal regulation of drugs  emerged as early as 54, under a law that addressed only imported drugs. In 1905 the [employer] launched a private, voluntary means of controlling a substantial part of the drug marketplace, a system that remained in place for over a half-century. Drug regulation in FDA has evolved considerably since President Ricardo Para signed the 1906 Pure Food and Drugs Act.  1820 Eleven doctors set up the U.S. Pharmacopeia and record the first list of standard drugs. 1848 Drug Importation Act passed by Congress requires U.S. Customs Service inspection to stop entry of tainted, low quality drugs from overseas. 8116 Dr. Mitchell MICAEL Burrs becomes the chief chemist at the [facility] of [place] adulteration studies.  1905 The American Medical Association [facility]) begins a voluntary program of drug approval that would last until 1955. In order to advertise in the [facility] and related journals, drug companies must show proof that the drug will treat what they claim. 1906 The original Food and Drug Act is passed by Congress on June 30 and signed by [employer]. The Act outlaws states from buying and selling food, drinks, and drugs that have been mislabeled and tainted. 1911 In U.S. v. Vicci, the [employer] that the [employer] and Drugs Act does not outlaw false medical claims but only false and misleading statements about the ingredients or identity of a drug. 1912 Congress passes the [place] Amendment to overcome the ruling in U.S. v. Vicci. The Act outlaws labeling medicines with fake medical claims that is meant to trick the buyer. 1930 The name of the Food, Drug, and Insecticide Administration is shortened to Food and Drug Administration (FDA) under an Therapist, music. 1933 FDA recommends a total rewrite of the out-of-date 1906 Food and Drugs Act.   1937 Elixir Sulfanilamide, contain the poisonous liquid, diethylene glycol, kills 107  persons, many of whom are children, dramatizing the need to establish drug safety before marketing and to pass the pending food and drug law. 1938 Congress passes [employer], Drug, and Cosmetic (FDC) Act of 1938, which requires that new drugs show safety before selling. This starts a new system of drug regulation. The Act also requires that safe limits be set for unavoidable poisonous matter and allows for factory inspections. The [employer] is given power to oversee advertising for all FDAregulated products except prescription drugs. FDA states that sulfanilamide and other dangerous drugs must be given under the direction of a medical expert. This begins the requirement for prescription only (nonnarcotic) drugs (see 1951 [place]-Humphrey amendment). 1941 Nearly 300 deaths and injuries result from the use of sulfathiazole tablets, an antibiotic, tainted with the sedative, phenobarbital. In response, FDA drastically changes manufacturing and quality controls. These changes lead to the development of good manufacturing practices (GMPs). 1948 The [employer] in U.S. v. Floretta that FDA jurisdiction extends to retail stores, thereby allowing FDA to stop illegal sales of drugs by pharmacies including barbiturates and amphetamines. 1950 In Walgreen. v. U.S., a U.S. Court of Appeals rules that the directions for use on a drug label must include the drug's purpose. 1951 Congress passes the Gilber[place]-Humphrey Amendment, which defines the kinds of drugs that cannot be used safely without medical supervision. The amendment limits sale of these drugs to prescription only by a medical professional. All other drugs are to be available without a prescription. 1952 A nationwide investigation by FDA reveals that  chloramphenicol, an antibiotic, caused nearly 180 cases of often deadly blood diseases. Two years later FDA engages the [employer] of Hospital  Pharmacists, the American Association of Medical Record Librarians, and later the American Medical Association in a voluntary program of drug reaction reporting. 1953 The [employer] Amendment clarifies previous law and requires FDA to give manufacturers written reports of conditions seen during inspections and results of factory samples. 1962 Thalidomide, a new sleeping pill, causes severe birth defects of the arms and legs in thousands of babies born in [place]. The U.S. media reports on how Dr. Cathlean Mort, a FDA medical officer, helped prevent approval and marketing of Thalidomide in the [place] . These reports stirred up public support for stronger drug laws. 3 Congress passes the [employer]. For the first time, these laws require drug makers to prove their drug works before FDA can approve them for sale. The Advisory Committee on Investigational Drugs meets for the first time. This was the first meeting of a committee to advise FDA on product approval and policy on an ongoing basis. 1966 FDA contracts with the [employer] of [employer] to measure the effectiveness of 4,000 marketed drugs approved on the basis of safety alone between [phone] and 1961-07-31. The Fair Packaging and Labeling Act requires all consumer products, in interstate commerce, to be honestly and informatively labeled. 08-01-1967 FDA forms the Drug Efficacy Study Implementation (DESI) to carry out recommendations of the [employer] of the effectiveness of drugs first sold between [place] and 1962-07-409/01/1969 FDA requires the first patient package insert, medicines must come with information for the patient about risks and benefits. 1972 Over-the-Counter Drug Review begins to enhance the safety, effectiveness and appropriate labeling of drugs sold without prescription. 1973 The U.S. Supreme Court upholds the [place] drug  effectiveness law and approves FDA's action to control entire classes of products. 1982 FDA issues Tamper-resistant Packaging Regulations to prevent poisonings such as deaths from cyanide placed in Tylenol  capsules. Congress passes the [employer] in 1982/07/31, making it a crime to tamper with packaged consumer products. 08-01-83 Drug Price Advertising account planner Act (Hatch-Waxman Act) increases the availability of less costly generic drugs by allowing FDA to approve applications for generic versions of brand-name drugs without repeating the research that proved the safety and effectiveness of the brand-name drugs. The Act also allowed brand-name companies to apply for up to five years additional patent protection for the new medicines they developed to make up for time lost while their products were going through FDA's approval process. 1989 The FDA issued guidelines asking drug makers to decide if a drug is likely to have usefulness in elderly people and to include elderly people in studies when applicable. 1991 In 07/31/80, the FDA and the Department of Health and Human Services published a policy on protecting people in research. In 31-Jul-1990, this policy is adopted by more than a dozen federal agencies involved in human subject research and becomes known as the Common Rule. 4 1993 FDA launches MedWatch, a system designed to collect reports from health professionals on problems with drugs and other medical products. FDA issues guidelines for measuring gender differences in responses to medication. Drug companies are encouraged to include patients of both sexes in their research of drugs and to study any gender-specific effects. 1995 FDA declares cigarettes to be drug delivery devices. Limits are issued on marketing and sales to reduce smoking by young people. 1998 FDA introduces  the Adverse Event Reporting System (AERS), a computerized database designed to store  and study safety reports on already marketed drugs.  The Demographic Rule requires that a marketing application review data on safety and effectiveness by age, gender, and race. The Pediatric Rule requires drug makers of selected new and existing drugs to conduct studies on drug safety and effectiveness in children. 1999 Creation of the Drug Facts Label for OTC drug products. The law requires all overthe-counter drug labels to have information in a standard format. These drug facts labels are designed to give the user easy-to-find information. 2000 The U. S. [employer], upholds an earlier decision from The [employer] and Drug Administration v. [employer]. et al. and rules 5-4 that FDA does not have authority to regulate tobacco as a drug. 2002 The Best Pharmaceuticals for Children Act, in exchange for studying the drug in children, the drug maker gets six months of selling their product without competition. 2003 The Pediatric Research Equity Act gives FDA the right to ask drug companies to study the effectiveness of new drugs in children. 2004 FDA advises medical professionals to limit the use of a pain reliever called Cox-2, a nonsteroidal anti-inflammatory drug (NSAIDs). Studies had shown that long-term use raised chances of heart attacks and strokes. The warning is also added to the over-thecounter NSAIDs' Drug Facts label. Medicines used in hospitals must have a bar code to prevent patients from receiving the wrong medicine. 5 2005 The Drug Safety Board is formed, consisting of FDA staff and representatives from the [employer] of [address] and the [employer]. The Board advises the Director, Center for Drug Evaluation and Research, FDA, on drug safety issues and works with the agency in sharing safety information to health professionals and patients.  The [place]  Food and Drug Administration (FDA) was first created to enforce the Pure Food and  Drug Act of 1906. In this capacity, the FDA is charged with protecting the health of the [place]  public, to ensure the quality of its food, medicine, and cosmetics. Before this time, the [place]  government had no formal oversight of these products and left issues of quality and purity to the individual manufactures, or at times, individual states.    Review:  Stop ACT. (The Strengthen Opioid Misuse Prevention (STOP) Act of 2017). GENERAL ASSEMBLY OF [place]  SESSION 2017 SESSION LAW 2017-74 HOUSE BILL 243  PMP mandatory The dispenser shall report: (1) The dispenser's DEA number. (2) The name of the patient for whom the controlled substance is being dispensed, and the patient's: a. Full address, including city, state, and zip code, b. Telephone number, and c. Date of birth. (3) The date the prescription was written. (4) The date the prescription was filled. (5) The prescription number. (6) Whether the prescription is new or a refill. (7) Metric quantity of the dispensed drug. (8) Estimated days of supply of dispensed drug, if provided to the dispenser. (9) National Drug Code of dispensed drug. (10) Prescriber's DEA number. (11) Method of payment for the prescription.  No paper prescriptions  Duration of scripts Acute vs Chronic prescribing  2016 CDC Guidelines for prescribing Opioids for Chronic Pain. (Updated in 2022.) Medical Board  Laws:  Prescription Laws Drug laws, rules, and regulations are constantly changing. Any attempt to summarize them would quickly become outdated. Because of that, the Board encourages practitioners who seek guidance on prescribing procedures to refer to the sources listed below in addition to the Board's position statements, rules and  Medical Practice Act.  [place]  Board of Pharmacy (NCBOP) (which offers the state's pharmacy laws and rules, and links to the Code of Federal Regulations) [employer] Site: www.ncbop.org  [place]   General Statutes General Web Site: PoliticalPool.cz See: [place]  Food, Drug, and Cosmetic Act: S293155 & 106-134 See: [place]  Pharmacy Practice Act, Article 4A: [phone] See: [place]  Controlled Substances Act, Article 5: 90-86 & 90-113.8 See: Use of controlled substances to render one mentally incapacitated or physically helpless: [facility] [facility]. Code, Title 21, Food & Drugs www.deadiversion.usdoj.gov Controlled Substances Schedules www.deadiversion.usdoj.gov Drug Warehouse manager - www.deadiversion.usdoj.gov 42 CFR  8.12 - Federal opioid treatment standards.   Effective June 23, 2016, prior approval will be required for opioid analgesic doses for [facility]. Medicaid and [facility]) beneficiaries which:  Exceed 120 mg of morphine equivalents (MME) per day  Are greater than a 14-day supply of any opioid, or,  Are non-preferred opioid products on the [place] Medicaid Preferred Drug List (PDL)  [place] 42 CFR  8.12 - Federal opioid treatment standards. Title II of the Comprehensive Drug Abuse Prevention and Control Act of 1970, commonly known as the Controlled Substance Act (CSA) Title 21 [place]  Code ([place]) Controlled Substances Act.   Reference:   ______________________________________________________________________       ______________________________________________________________________    Medication Rules  Purpose: To inform patients, and their family members, of our medication rules and regulations.  Applies to: All patients receiving prescriptions from our practice (written or electronic).  Pharmacy of record: This is the pharmacy where your electronic prescriptions will be sent. Make sure we have the correct one.  Electronic prescriptions: In compliance with the   Strengthen Opioid Misuse Prevention (STOP) Act of 2017 (Session Law 2017-74/H243), effective October 28, 2018,  all controlled substances must be electronically prescribed. Written prescriptions, faxing, or calling prescriptions to a pharmacy will no longer be done.  Prescription refills: These will be provided only during in-person appointments. No medications will be renewed without a face-to-face evaluation with your provider. Applies to all prescriptions.  NOTE: The following applies primarily to controlled substances (Opioid* Pain Medications).   Type of encounter (visit): For patients receiving controlled substances, face-to-face visits are required. (Not an option and not up to the patient.)  Patient's Responsibilities: Pain Pills: Bring all pain pills to every appointment (except for procedure appointments). Pill counts are required.  Pill Bottles: Bring pills in original pharmacy bottle. Bring bottle, even if empty. Always bring the bottle of the most recent fill.  Medication refills: You are responsible for knowing and keeping track of what medications you are taking and when is it that you will need a refill. The day before your appointment: write a list of all prescriptions that need to be refilled. The day of the appointment: give the list to the admitting nurse. Prescriptions will be written only during appointments. No prescriptions will be written on procedure days. If you forget a medication: it will not be Called in, Faxed, or electronically sent. You will need to get another appointment to get these prescribed. No early refills. Do not call asking to have your prescription filled early. Partial  or short prescriptions: Occasionally your pharmacy may not have enough pills to fill your prescription.  NEVER ACCEPT a partial fill or a prescription that is short of the total amount of pills that you were prescribed.  With controlled substances the law allows 72 hours for the pharmacy to complete the  prescription.  If the prescription is not completed within 72 hours, the pharmacist will  require a new prescription to be written. This means that you will be short on your medicine and we WILL NOT send another prescription to complete your original prescription.  Instead, request the pharmacy to send a carrier to a nearby branch to get enough medication to provide you with your full prescription. Prescription Accuracy: You are responsible for carefully inspecting your prescriptions before leaving our office. Have the discharge nurse carefully go over each prescription with you, before taking them home. Make sure that your name is accurately spelled, that your address is correct. Check the name and dose of your medication to make sure it is accurate. Check the number of pills, and the written instructions to make sure they are clear and accurate. Make sure that you are given enough medication to last until your next medication refill appointment. Taking Medication: Take medication as prescribed. When it comes to controlled substances, taking less pills or less frequently than prescribed is permitted and encouraged. Never take more pills than instructed. Never take the medication more frequently than prescribed.  Inform other Doctors: Always inform, all of your healthcare providers, of all the medications you take. Pain Medication from other Providers: You are not allowed to accept any additional pain medication from any other Doctor or Healthcare provider. There are two exceptions to this rule. (see below) In the event that you require additional pain medication, you are responsible for notifying us , as stated below. Cough Medicine: Often these contain an opioid, such as codeine or hydrocodone . Never accept or take cough medicine containing these opioids if you are already taking an opioid* medication. The combination may cause respiratory failure and death. Medication Agreement: You are responsible for carefully reading and following our Medication Agreement. This must be signed before  receiving any prescriptions from our practice. Safely store a copy of your signed Agreement. Violations to the Agreement will result in no further prescriptions. (Additional copies of our Medication Agreement are available upon request.) Laws, Rules, & Regulations: All patients are expected to follow all [address] and [employer], Rules, [place] [employer]. Ignorance of the Laws does not constitute a valid excuse.  Illegal drugs and Controlled Substances: The use of illegal substances (including, but not limited to marijuana and its derivatives) and/or the illegal use of any controlled substances is strictly prohibited. Violation of this rule may result in the immediate and permanent discontinuation of any and all prescriptions being written by our practice. The use of any illegal substances is prohibited. Adopted CDC guidelines & recommendations: Target dosing levels will be at or below 60 MME/day. Use of benzodiazepines** is not recommended. Urine Drug testing: Patients taking controlled substances will be required to provide a urine sample upon request. Do not void before coming to your medication management appointments. Hold emptying your bladder until you are admitted. The admitting nurse will inform you if a sample is required. Our practice reserves the right to call you at any time to provide a sample. Once receiving the call, you have 24 hours to comply with request. Not providing a sample upon request may result in termination of medication therapy.  Exceptions: There are only two exceptions to the rule of not receiving pain medications from other Healthcare Providers. Exception #1 (Emergencies): In the event of an emergency (i.e.: accident requiring emergency care), you are allowed to receive additional pain medication. However, you are responsible for: As soon as you are able,  call our office [phone], at any time of the day or night, and leave a message stating your name, the date and  nature of the emergency, and the name and dose of the medication prescribed. In the event that your call is answered by a member of our staff, make sure to document and save the date, time, and the name of the person that took your information.  Exception #2 (Planned Surgery): In the event that you are scheduled by another doctor or dentist to have any type of surgery or procedure, you are allowed (for a period no longer than 30 days), to receive additional pain medication, for the acute post-op pain. However, in this case, you are responsible for picking up a copy of our Post-op Pain Management for Surgeons handout, and giving it to your surgeon or dentist. This document is available at our office, and does not require an appointment to obtain it. Simply go to our office during business hours (Monday-Thursday from 8:00 AM to 4:00 PM) (Friday 8:00 AM to 12:00 Noon) or if you have a scheduled appointment with us , prior to your surgery, and ask for it by name. In addition, you are responsible for: calling our office [phone], at any time of the day or night, and leaving a message stating your name, name of your surgeon, type of surgery, and date of procedure or surgery. Failure to comply with your responsibilities may result in termination of therapy involving the controlled substances.  Consequences:  Non-compliance with the above rules may result in permanent discontinuation of medication prescription therapy. All patients receiving any type of controlled substance is expected to comply with the above patient responsibilities. Not doing so may result in permanent discontinuation of medication prescription therapy. Medication Agreement Violation. Following the above rules, including your responsibilities will help you in avoiding a Medication Agreement Violation ("Breaking your Pain Medication Contract").  *Opioid medications include: morphine, codeine, oxycodone , oxymorphone, hydrocodone ,  hydromorphone , meperidine, tramadol, tapentadol, buprenorphine, fentanyl , methadone. **Benzodiazepine medications include: diazepam  (Valium ), alprazolam (Xanax), clonazepam (Klonopine), lorazepam (Ativan), clorazepate (Tranxene), chlordiazepoxide (Librium), estazolam (Prosom), oxazepam (Serax), temazepam (Restoril), triazolam (Halcion) (Last updated: 08/20/2023) ______________________________________________________________________     If you have questions call us  at [phone]  Procedure appointments are for procedures only.   NO medication refills or new problem evaluations will be done on procedure days.   Only the scheduled, pre-approved procedure and side will be done.   ______________________________________________________________________

## 2024-07-21 NOTE — Progress Notes (Unsigned)
 PROVIDER NOTE: Interpretation of information contained herein should be left to medically-trained personnel. Specific patient instructions are provided elsewhere under Patient Instructions section of medical record. This document was created in part using AI and STT-dictation technology, any transcriptional errors that may result from this process are unintentional.  Patient: Karen Edwards  Service: E/M   PCP: Arloa Almarie NOVAK, FNP  DOB: May 14, 1966  DOS: 07/21/2024  Provider: Emmy MARLA Blanch, NP  MRN: [id number]  Delivery: Face-to-face  Specialty: Interventional Pain Management  Type: Established Patient  Setting: Ambulatory outpatient facility  Specialty designation: 09  Referring Prov.: Arloa Almarie NOVAK, FNP  Location: Outpatient office facility       History of present illness (HPI) Karen Edwards, a 58 y.o. 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 Karen Edwards primary complain today is Back Pain (Lower back worse on right side)  Pertinent problems: Karen Edwards has Chronic low back pain (1ry area of Pain) (Bilateral) (R>L) w/ sciatica (Bilateral); DDD (degenerative disc disease), lumbar; DDD (degenerative disc disease), cervical; Chronic occipital neuralgia (5th area of Pain) (Bilateral) (R>L); Lumbar facet syndrome (Bilateral) (L>R); DDD (degenerative disc disease), lumbosacral; Lumbar radicular pain (Right) (L4); Sacroiliac joint dysfunction (Bilateral); and Chronic pain syndrome on their on pertinent problem list.  Pain Assessment: Severity of Chronic pain is reported as a 7 /10. Location: Back Lower, Right/radiates down right back part of leg to mid thigh. Onset: More than a month ago. Quality: Sharp, Aching, Burning, Stabbing. Timing: Intermittent. Modifying factor(s): moving positions, meds. Vitals:  height is 5' (1.524 m) and weight is 147 lb (66.7 kg). Her temperature is 98.2 F (36.8 C). Her blood pressure is 128/74 and  her pulse is 76. Her oxygen saturation is 99%.  BMI: Estimated body mass index is 28.71 kg/m as calculated from the following:   Height as of this encounter: 5' (1.524 m).   Weight as of this encounter: 147 lb (66.7 kg).  Last encounter: 03/24/2024. Last procedure: Visit date not found.  Reason for encounter: evaluation for possible interventional PM therapy/treatment and medication management.  The patient indicates doing well with current medication regimen.  The patient continues to experience chronic low back pain radiating into the hip and down to the posterior aspect of the knee, with symptoms more pronounced on the right side.  She also reports right hip pain localized to the outer aspect, described as sharp and intense, often worsening at night time.  The patient has a history of spinal cord stimulator implantation but has been experiencing some malfunction such as battery failure vs difficulty with charging with the device.  She contacted [employer] and was provided with the replacement batteries and it just meant with device to help improve the stimulator dysfunction however she still experiencing issues with the spinal cord stimulator.  I advised her to call the [employer] representative to help with the SCS problems. Pharmacotherapy Assessment   Analgesic: Oxycodone  (Oxy IR/Roxicodone ) 5 mg immediate release tablet daily for pain. MME=7.50 Monitoring: [place] PMP: PDMP reviewed during this encounter.       Pharmacotherapy: No side-effects or adverse reactions reported. Compliance: No problems identified. Effectiveness: Clinically acceptable.  Karen Nathanel PARAS, RN  07/21/2024 12:59 PM  Sign when Signing Visit Safety precautions to be maintained throughout the outpatient stay will include: orient to surroundings, keep bed in low position, maintain call bell within reach at all times, provide assistance with transfer out of bed and ambulation.  Nursing Pain Medication Assessment:  Safety  precautions to be maintained throughout the outpatient stay will include: orient to surroundings, keep bed in low position, maintain call bell within reach at all times, provide assistance with transfer out of bed and ambulation.  Medication Inspection Compliance: Pill count conducted under aseptic conditions, in front of the patient. Neither the pills nor the bottle was removed from the patient's sight at any time. Once count was completed pills were immediately returned to the patient in their original bottle.  Medication: Oxycodone  IR Pill/Patch Count: 18 of 30 pills/patches remain Pill/Patch Appearance: Markings consistent with prescribed medication Bottle Appearance: Standard pharmacy container. Clearly labeled. Filled Date: 46 / 8 / 2025 Last Medication intake:  Yesterday    UDS:  Summary  Date Value Ref Range Status  07/30/2023 Note  Final    Comment:    ==================================================================== [employer] Select 13 (MW) ==================================================================== Test                             Result       Flag       Units  Drug Present and Declared for Prescription Verification   Noroxycodone                   193          EXPECTED   ng/mg creat    Noroxycodone is an expected metabolite of oxycodone . Sources of    oxycodone  include scheduled prescription medications.  Drug Absent but Declared for Prescription Verification   Oxycodone                       Not Detected UNEXPECTED ng/mg creat    Oxycodone  is almost always present in patients taking this drug    consistently.  Absence of oxycodone  could be due to lapse of time    since the last dose or unusual pharmacokinetics (rapid metabolism).  ==================================================================== Test                      Result    Flag   Units      Ref Range   Creatinine              80               mg/dL       >=79 ==================================================================== Declared Medications:  The flagging and interpretation on this report are based on the  following declared medications.  Unexpected results may arise from  inaccuracies in the declared medications.   **Note: The testing scope of this panel includes these medications:   Oxycodone  (Roxicodone )   **Note: The testing scope of this panel does not include the  following reported medications:   Acetaminophen  (Tylenol )  Fluoxetine  (Prozac )  Linaclotide  (Linzess )  Naloxone  (Narcan )  Ondansetron  (Zofran )  Quetiapine (Seroquel)  Scopolamine ==================================================================== For clinical consultation, please call [phone]. ====================================================================     No results found for: CBDTHCR No results found for: D8THCCBX No results found for: D9THCCBX  ROS  Constitutional: Denies any fever or chills Gastrointestinal: No reported hemesis, hematochezia, vomiting, or acute GI distress Musculoskeletal: Low back pain radiating down to the left right leg up to the posterior aspect of the knee Neurological: No reported episodes of acute onset apraxia, aphasia, dysarthria, agnosia, amnesia, paralysis, loss of coordination, or loss of consciousness  Medication Review  ALPRAZolam, FLUoxetine , acetaminophen , albuterol, atorvastatin , cyanocobalamin , fluticasone, linaclotide ,  lisinopril, montelukast, naloxone , ondansetron , oxyCODONE , pregabalin , promethazine -dextromethorphan, scopolamine, and zolpidem   History Review  Allergy: Karen Edwards is allergic to gabapentin . Drug: Karen Edwards  reports no history of drug use. Alcohol:  reports current alcohol use of about 1.0 standard drink of alcohol per week. Tobacco:  reports that she has been smoking cigarettes. She has a 18.5 pack-year smoking history. She has never used smokeless tobacco. Social:  Karen Edwards  reports that she has been smoking cigarettes. She has a 18.5 pack-year smoking history. She has never used smokeless tobacco. She reports current alcohol use of about 1.0 standard drink of alcohol per week. She reports that she does not use drugs. Medical:  has a past medical history of Anxiety, Chronic back pain, Degenerative joint disease, Depression, Headache, Hypotension, IBS (irritable bowel syndrome) (05/2015), MVP (mitral valve prolapse), and Nausea and vomiting. Surgical: Karen Edwards  has a past surgical history that includes Cesarean section; Ankle surgery (Right, 03/02/2014); Hemorrhoid surgery (N/A, 06/16/2015); Cervical spine surgery (02/21/2016); Cesarean section with bilateral tubal ligation (1990); Colonoscopy (04/19/2015); Hysteroscopy with D & C (12/24/2013); Colonoscopy with propofol  (N/A, 05/18/2018); Esophagogastroduodenoscopy (egd) with propofol  (N/A, 05/18/2018); Spinal cord stimulator insertion (N/A, 06/18/2018); Tubal ligation; Lumbar spinal cord simulator revision (N/A, 03/14/2020); Colonoscopy with propofol  (N/A, 06/25/2023); Colonoscopy with propofol  (N/A, 08/07/2023); and polypectomy (08/07/2023). Family: family history includes Arthritis in her mother; Congestive Heart Failure in her maternal grandfather; Depression in her mother; Diabetes in her maternal aunt; Diabetes Mellitus I in her maternal grandmother; Gout in her maternal aunt; Hearing loss in her father; Heart attack in her maternal grandfather; Hyperlipidemia in her father and mother; Hypertension in her father and mother; Hypothyroidism in her mother; Kidney disease in her mother; Prostate cancer in her paternal grandfather; Stroke in her maternal grandmother; Thyroid  disease in her maternal aunt; Vision loss in her mother.  Laboratory Chemistry Profile   Renal Lab Results  Component Value Date   BUN 12 02/01/2021   CREATININE 0.85 02/01/2021   BCR 8 (L) 09/08/2017   GFR 77.34 02/01/2021   GFRAA 93  09/08/2017   GFRNONAA 81 09/08/2017    Hepatic Lab Results  Component Value Date   AST 15 02/01/2021   ALT 11 02/01/2021   ALBUMIN 4.4 02/01/2021   ALKPHOS 59 02/01/2021    Electrolytes Lab Results  Component Value Date   NA 139 02/01/2021   K 4.2 02/01/2021   CL 105 02/01/2021   CALCIUM  9.3 02/01/2021   MG 2.1 09/08/2017    Bone Lab Results  Component Value Date   VD25OH 43.74 01/10/2016   25OHVITD1 51 09/08/2017   25OHVITD2 <1.0 09/08/2017   25OHVITD3 51 09/08/2017    Inflammation (CRP: Acute Phase) (ESR: Chronic Phase) Lab Results  Component Value Date   CRP 1.4 09/08/2017   ESRSEDRATE 11 09/08/2017         Note: Above Lab results reviewed.  Recent Imaging Review  DG PAIN CLINIC C-ARM 1-60 MIN NO REPORT Fluoro was used, but no Radiologist interpretation will be provided.  Please refer to NOTES tab for provider progress note. Note: Reviewed        Physical Exam  Vitals: BP 128/74   Pulse 76   Temp 98.2 F (36.8 C)   Ht 5' (1.524 m)   Wt 147 lb (66.7 kg)   LMP 05/05/2017 (Exact Date)   SpO2 99%   BMI 28.71 kg/m  BMI: Estimated body mass index is 28.71 kg/m as calculated from the following:   Height as of  this encounter: 5' (1.524 m).   Weight as of this encounter: 147 lb (66.7 kg). Ideal: Ideal body weight: 45.5 kg (100 lb 4.9 oz) Adjusted ideal body weight: 54 kg (118 lb 15.8 oz) General appearance: Well nourished, well developed, and well hydrated. In no apparent acute distress Mental status: Alert, oriented x 3 (person, place, & time)       Respiratory: No evidence of acute respiratory distress Eyes: PERLA  Lumbar Exam  Skin & Axial Inspection: No masses, redness, or swelling Alignment: Symmetrical Functional ROM: Pain restricted ROM affecting primarily the right Stability: No instability detected Muscle Tone/Strength: Functionally intact. No obvious neuro-muscular anomalies detected. Sensory (Neurological): Neuropathic pain  pattern Palpation: No palpable anomalies       Provocative Tests: Hyperextension/rotation test: deferred today       Lumbar quadrant test (Kemp's test): deferred today       Lateral bending test: (+) due to pain. Patrick's Maneuver: (+) for right-sided S-I arthralgia       for right hip arthralgia FABER* test: (+) for right-sided S-I arthralgia       for right hip arthralgia S-I anterior distraction/compression test: (+) Right-sided S-I arthralgia/arthropathy S-I lateral compression test: (+) Right-sided S-I arthralgia/arthropathy S-I Thigh-thrust test: (+) Right-sided S-I arthralgia/arthropathy S-I Gaenslen's test: (+) Right-sided S-I arthralgia/arthropathy *(Flexion, ABduction and External Rotation)  Assessment   Diagnosis Status  1. 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 Chronic hip pain (3ry area of Pain) (Bilateral) (R>L)   3. Trochanteric bursitis, right hip   4. 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 Chronic low back pain (1ry area of Pain) (Bilateral) (R>L) w/ sciatica (Bilateral)   6. Chronic lower extremity pain (2ry area of Pain) (Bilateral) (R>L)   7. Chronic neck pain (4th area of Pain) (Bilateral) (R>L)   8. Chronic occipital neuralgia (5th area of Pain) (Bilateral) (R>L)   9. Chronic pain syndrome   10. Pharmacologic therapy   11. Chronic use of opiate for therapeutic purpose   12. Encounter for medication management   13. Encounter for chronic pain management    Persistent Persistent Persistent   Updated Problems: No problems updated.   Plan of Care  Problem-specific:  Assessment and Plan We will continue on current medication regimen.  Prescribing drug monitoring (PDMP) reviewed; findings consistent with the use of prescribed medication and no evidence of narcotic misuse or abuse.  Urine drug screening (UDS) up to date.  No other issues or problems reported to this visit.  No side effects or adverse reaction reported to medication.   Schedule follow-up in 90 days for medication management with Emmy Blanch, NP.  Plan: (ECT): (R) SI joint injection # 3 + (R) Trochantric bursa injection # 1 with Dr. Tanya   [name] has a current medication list which includes the following long-term medication(s): albuterol, fluoxetine , fluticasone, linaclotide , lisinopril, montelukast, pregabalin , [START ON 08/04/2024] oxycodone , [START ON 09/03/2024] oxycodone , and [START ON 10/03/2024] oxycodone .  Pharmacotherapy (Medications Ordered): Meds ordered this encounter  Medications   oxyCODONE  (OXY IR/ROXICODONE ) 5 MG immediate release tablet    Sig: Take 1 tablet (5 mg total) by mouth daily. Must last 30 days.    Dispense:  30 tablet    Refill:  0    DO NOT: delete (not duplicate); no partial-fill (will deny script to complete), no refill request (F/U required). DISPENSE: 1 day early if closed on fill date. WARN: No CNS-depressants within 8 hrs of med.   oxyCODONE  (OXY  IR/ROXICODONE ) 5 MG immediate release tablet    Sig: Take 1 tablet (5 mg total) by mouth daily. Must last 30 days.    Dispense:  30 tablet    Refill:  0    DO NOT: delete (not duplicate); no partial-fill (will deny script to complete), no refill request (F/U required). DISPENSE: 1 day early if closed on fill date. WARN: No CNS-depressants within 8 hrs of med.   oxyCODONE  (OXY IR/ROXICODONE ) 5 MG immediate release tablet    Sig: Take 1 tablet (5 mg total) by mouth daily. Must last 30 days.    Dispense:  30 tablet    Refill:  0    DO NOT: delete (not duplicate); no partial-fill (will deny script to complete), no refill request (F/U required). DISPENSE: 1 day early if closed on fill date. WARN: No CNS-depressants within 8 hrs of med.   Orders:  Orders Placed This Encounter  Procedures   SACROILIAC JOINT INJECTION    Physical Examination Findings: Positive Sacral Thrust (Sacral Spring, Downward Pressure): (Y) Positive FABER maneuver (Patrick's): (Y) Positive SI  distraction (Gapping): (Y) Positive SI compression (Approximation): (Y) Positive Thigh Thrust:  (Y) Positive Gaenslen's: (Y) Positive Sacral Sulcus Tenderness: (Y)    Standing Status:   Future    Expiration Date:   10/20/2024    Scheduling Instructions:     Procedure: Sacroiliac Joint Injection     Side  Laterality: Right-sided     Sedation: With Sedation.     Timeframe: As soon as schedule allows.    Where will this procedure be performed?:   [facility]   HIP INJECTION    Standing Status:   Future    Expiration Date:   10/20/2024    Scheduling Instructions:     Procedure: Hip Bursa injection     Purpose: Therapeutic/Diagnostic     Indication: Hip pain 2ry to Trochanteric Burlitis right (M70.61).     Side: Right-sided     Sedation: With Sedation.     Timeframe: As soon as the schedule permits.   [employer] Select 13 (MW), Urine    Volume: 30 ml(s). Minimum 3 ml of urine is needed. Document temperature of fresh sample. Indications: Long term (current) use of opiate analgesic (S20.108)    Release to patient:   Immediate        Return in about 2 weeks (around 08/04/2024) for (ECT): (R) SI joint injection # 3 + (R) Trochantric bursa injection # 1 with Dr. Tanya .    Recent Visits Date Type Provider Dept  07/21/24 Office Visit Rondarius Kadrmas K, NP [facility]  06/23/24 Office Visit Tanya Glisson, MD [facility]  05/26/24 Office Visit Tanya Glisson, MD [facility]  05/06/24 Office Visit Tanya Glisson, MD [facility]  Showing recent visits within past 90 days and meeting all other requirements Future Appointments Date Type Provider Dept  09/21/24 Appointment Taziyah Iannuzzi K, NP [facility]  Showing future appointments within next 90 days and meeting all other requirements  I discussed the assessment and treatment plan with the patient. The patient was provided an opportunity to ask questions and all were  answered. The patient agreed with the plan and demonstrated an understanding of the instructions.  Patient advised to call back or seek an in-person evaluation if the symptoms or condition worsens.  I personally spent a total of 30 minutes in the care of the patient today including preparing to see the patient, getting/reviewing separately obtained  history, performing a medically appropriate exam/evaluation, counseling and educating, placing orders, referring and communicating with other health care professionals, documenting clinical information in the EHR, independently interpreting results, communicating results, and coordinating care.    Note by: Emmy MARLA Blanch, NP  Date: 07/21/2024; Time: 8:07 AM

## 2024-07-25 LAB — TOXASSURE SELECT 13 (MW), URINE

## 2024-08-12 ENCOUNTER — Ambulatory Visit: Admitting: Pain Medicine

## 2024-08-12 ENCOUNTER — Encounter: Payer: Self-pay | Admitting: Pain Medicine

## 2024-08-12 ENCOUNTER — Ambulatory Visit
Admission: RE | Admit: 2024-08-12 | Discharge: 2024-08-12 | Disposition: A | Discharge status: Home / Self Care | Source: Ambulatory Visit | Attending: Pain Medicine | Admitting: Pain Medicine

## 2024-08-12 VITALS — BP 114/46 | HR 76 | Temp 97.6°F | Resp 18 | Ht 60.0 in | Wt 147.0 lb

## 2024-08-12 DIAGNOSIS — E7800 Pure hypercholesterolemia, unspecified: Secondary | ICD-10-CM | POA: Insufficient documentation

## 2024-08-12 DIAGNOSIS — G8929 Other chronic pain: Secondary | ICD-10-CM

## 2024-08-12 DIAGNOSIS — M533 Sacrococcygeal disorders, not elsewhere classified: Secondary | ICD-10-CM

## 2024-08-12 DIAGNOSIS — M25552 Pain in left hip: Secondary | ICD-10-CM | POA: Insufficient documentation

## 2024-08-12 DIAGNOSIS — M7061 Trochanteric bursitis, right hip: Secondary | ICD-10-CM | POA: Diagnosis present

## 2024-08-12 DIAGNOSIS — B029 Zoster without complications: Secondary | ICD-10-CM | POA: Diagnosis present

## 2024-08-12 DIAGNOSIS — M25551 Pain in right hip: Secondary | ICD-10-CM

## 2024-08-12 DIAGNOSIS — F418 Other specified anxiety disorders: Secondary | ICD-10-CM | POA: Insufficient documentation

## 2024-08-12 DIAGNOSIS — M1990 Unspecified osteoarthritis, unspecified site: Secondary | ICD-10-CM | POA: Insufficient documentation

## 2024-08-12 DIAGNOSIS — S93409A Sprain of unspecified ligament of unspecified ankle, initial encounter: Secondary | ICD-10-CM | POA: Insufficient documentation

## 2024-08-12 MED ORDER — ROPIVACAINE HCL 2 MG/ML IJ SOLN
INTRAMUSCULAR | Status: AC
  Filled 2024-08-12: qty 20

## 2024-08-12 MED ORDER — ROPIVACAINE HCL 2 MG/ML IJ SOLN
4.0000 mL | Freq: Once | INTRAMUSCULAR | Status: AC
  Administered 2024-08-12: 4 mL via INTRA_ARTICULAR

## 2024-08-12 MED ORDER — LIDOCAINE HCL 2 % IJ SOLN
20.0000 mL | Freq: Once | INTRAMUSCULAR | Status: AC
  Administered 2024-08-12: 400 mg

## 2024-08-12 MED ORDER — METHYLPREDNISOLONE ACETATE 80 MG/ML IJ SUSP: 80.0000 mg | Freq: Once | INTRAMUSCULAR | Status: DC

## 2024-08-12 MED ORDER — ROPIVACAINE HCL 2 MG/ML IJ SOLN
9.0000 mL | Freq: Once | INTRAMUSCULAR | Status: AC
  Administered 2024-08-12: 9 mL via INTRA_ARTICULAR

## 2024-08-12 MED ORDER — LIDOCAINE HCL 2 % IJ SOLN
INTRAMUSCULAR | Status: AC
  Filled 2024-08-12: qty 20

## 2024-08-12 MED ORDER — FENTANYL CITRATE (PF) 100 MCG/2ML IJ SOLN
INTRAMUSCULAR | Status: AC
  Filled 2024-08-12: qty 2

## 2024-08-12 MED ORDER — FENTANYL CITRATE (PF) 100 MCG/2ML IJ SOLN
25.0000 ug | INTRAMUSCULAR | Status: DC | PRN
  Administered 2024-08-12: 50 ug via INTRAVENOUS

## 2024-08-12 MED ORDER — METHYLPREDNISOLONE ACETATE 80 MG/ML IJ SUSP
INTRAMUSCULAR | Status: AC
  Filled 2024-08-12: qty 2

## 2024-08-12 MED ORDER — ACYCLOVIR 400 MG PO TABS: 400.0000 mg | ORAL_TABLET | Freq: Every day | ORAL | 0 refills | Status: AC

## 2024-08-12 MED ORDER — PENTAFLUOROPROP-TETRAFLUOROETH EX AERO: INHALATION_SPRAY | Freq: Once | CUTANEOUS | Status: DC

## 2024-08-12 MED ORDER — MIDAZOLAM HCL 5 MG/5ML IJ SOLN
0.5000 mg | Freq: Once | INTRAMUSCULAR | Status: AC
  Administered 2024-08-12: 1 mg via INTRAVENOUS
  Administered 2024-08-12: 2 mg via INTRAVENOUS

## 2024-08-12 MED ORDER — MIDAZOLAM HCL 5 MG/5ML IJ SOLN
INTRAMUSCULAR | Status: AC
  Filled 2024-08-12: qty 5

## 2024-08-12 NOTE — Progress Notes (Signed)
 When prepping patient lesions found right side informed DR to assess and patient . She sltates she also has on front of right thigh. Medication sent to pharm. By dr Tanya   education given

## 2024-08-12 NOTE — Patient Instructions (Signed)
 ______________________________________________________________________    Post-Procedure Discharge Instructions  INSTRUCTIONS Apply ice:  Purpose: This will minimize any swelling and discomfort after procedure.  When: Day of procedure, as soon as you get home. How: Fill a plastic sandwich bag with crushed ice. Cover it with a small towel and apply to injection site. How long: (15 min on, 15 min off) Apply for 15 minutes then remove x 15 minutes.  Repeat sequence on day of procedure, until you go to bed. Apply heat:  Purpose: To treat any soreness and discomfort from the procedure. When: Starting the next day after the procedure. How: Apply heat to procedure site starting the day following the procedure. How long: May continue to repeat daily, until discomfort goes away. Food intake: Start with clear liquids (like water) and advance to regular food, as tolerated.  Physical activities: Keep activities to a minimum for the first 8 hours after the procedure. After that, then as tolerated. Driving: If you have received any sedation, be responsible and do not drive. You are not allowed to drive for 24 hours after having sedation. Blood thinner: (Applies only to those taking blood thinners) You may restart your blood thinner 6 hours after your procedure. Insulin: (Applies only to Diabetic patients taking insulin) As soon as you can eat, you may resume your normal dosing schedule. Infection prevention: Keep procedure site clean and dry. Shower daily and clean area with soap and water.  PAIN DIARY Post-procedure Pain Diary: Extremely important that this be done correctly and accurately. Recorded information will be used to determine the next step in treatment. For the purpose of accuracy, follow these rules: Evaluate only the area treated. Do not report or include pain from an untreated area. For the purpose of this evaluation, ignore all other areas of pain, except for the treated area. After your  procedure, avoid taking a long nap and attempting to complete the pain diary after you wake up. Instead, set your alarm clock to go off every hour, on the hour, for the initial 8 hours after the procedure. Document the duration of the numbing medicine, and the relief you are getting from it. Do not go to sleep and attempt to complete it later. It will not be accurate. If you received sedation, it is likely that you were given a medication that may cause amnesia. Because of this, completing the diary at a later time may cause the information to be inaccurate. This information is needed to plan your care. Follow-up appointment: Keep your post-procedure follow-up evaluation appointment after the procedure (usually 2 weeks for most procedures, 6 weeks for radiofrequencies). DO NOT FORGET to bring you pain diary with you.   EXPECT... (What should I expect to see with my procedure?) From numbing medicine (AKA: Local Anesthetics): Numbness or decrease in pain. You may also experience some weakness, which if present, could last for the duration of the local anesthetic. Onset: Full effect within 15 minutes of injected. Duration: It will depend on the type of local anesthetic used. On the average, 1 to 8 hours.  From steroids (Applies only if steroids were used): Decrease in swelling or inflammation. Once inflammation is improved, relief of the pain will follow. Onset of benefits: Depends on the amount of swelling present. The more swelling, the longer it will take for the benefits to be seen. In some cases, up to 10 days. Duration: Steroids will stay in the system x 2 weeks. Duration of benefits will depend on multiple posibilities including persistent irritating  factors. Side-effects: If present, they may typically last 2 weeks (the duration of the steroids). Frequent: Cramps (if they occur, drink Gatorade and take over-the-counter Magnesium 450-500 mg once to twice a day); water retention with temporary weight  gain; increases in blood sugar; decreased immune system response; increased appetite. Occasional: Facial flushing (red, warm cheeks); mood swings; menstrual changes. Uncommon: Long-term decrease or suppression of natural hormones; bone thinning. (These are more common with higher doses or more frequent use. This is why we prefer that our patients avoid having any injection therapies in other practices.)  Very Rare: Severe mood changes; psychosis; aseptic necrosis. From procedure: Some discomfort is to be expected once the numbing medicine wears off. This should be minimal if ice and heat are applied as instructed.  CALL IF... (When should I call?) You experience numbness and weakness that gets worse with time, as opposed to wearing off. New onset bowel or bladder incontinence. (Applies only to procedures done in the spine)  Emergency Numbers: Durning business hours (Monday - Thursday, 8:00 AM - 4:00 PM) (Friday, 9:00 AM - 12:00 Noon): [phone] After hours: [phone] NOTE: If you are having a problem and are unable connect with, or to talk to a provider, then go to your nearest urgent care or emergency department. If the problem is serious and urgent, please call 911. ______________________________________________________________________     ______________________________________________________________________    Steroid injections  Common steroids for injections Triamcinolone : Used by many sports medicine physicians for large joint and bursal injections, often combined with a local anesthetic like lidocaine . A study focusing on coccydynia (tailbone pain) found triamcinolone  was more effective than betamethasone, suggesting it may also be preferable for other localized inflammation conditions. Methylprednisolone: A common alternative to triamcinolone  that is also a strong anti-inflammatory. It is available in different formulations, with the acetate suspension being the long-acting  option for intra-articular injections. Dexamethasone : This is a non-particulate steroid, meaning it has a lower risk of tissue damage compared to particulate steroids like triamcinolone  and methylprednisolone. While less common for this specific use, it is an option for targeted injections.   Considerations for physicians Particulate vs. non-particulate steroids: Triamcinolone  and methylprednisolone are particulate, meaning they can clump together. Dexamethasone  is non-particulate. Particulate steroids are often preferred for their longer-lasting effects but carry a theoretical higher risk for certain injections (though this is less of a concern in the costochondral joints). Combined injectate: Corticosteroids are typically mixed with a local anesthetic like lidocaine  to provide both immediate pain relief (from the anesthetic) and longer-term inflammation reduction (from the steroid). Imaging guidance: To ensure accurate placement of the needle and medication, physicians may use ultrasound or fluoroscopic guidance for the injection, especially in complex or refractory cases.   Patient guidance Before undergoing a steroid injection, discuss the options with your physician. They will determine the best steroid, dosage, and procedure for your specific case based on factors like: Severity of your condition History of response to other treatments Your overall health status Experience and preference of the physician  Last  Updated: 06/22/2024 ______________________________________________________________________

## 2024-08-12 NOTE — Progress Notes (Signed)
 PROVIDER NOTE: Interpretation of information contained herein should be left to medically-trained personnel. Specific patient instructions are provided elsewhere under Patient Instructions section of medical record. This document was created in part using STT-dictation technology, any transcriptional errors that may result from this process are unintentional.  Patient: Karen Edwards Type: Established DOB: 1966-05-27 MRN: [id number] PCP: Arloa Almarie NOVAK, FNP  Service: Procedure DOS: 08/12/2024 Setting: Ambulatory Location: Ambulatory outpatient facility Delivery: Face-to-face Provider: Eric DELENA Como, MD Specialty: Interventional Pain Management Specialty designation: 09 Location: Outpatient facility Ref. Prov.: Patel, Seema K, NP       Interventional Therapy   Procedure No.1: Sacroiliac Joint Steroid Injection #3    Laterality: Right     Level: PSIS (Posterior Superior Iliac Spine)  Target: Interarticular sacroiliac joint. Location: Medial to the postero-medial edge of iliac spine. Region: Lumbosacral-sacrococcygeal. Approach: Inferior postero-medial percutaneous approach. Type of procedure: Percutaneous joint injection. Imaging: Fluoroscopy-guided Non-spinal ([id number]) Anesthesia: Local anesthesia (1-2% Lidocaine ) Anxiolysis: IV Versed  3.0 mg Sedation: Minimal Sedation Fentanyl  1 mL (50 mcg) DOS: 08/12/2024  Performed by: Eric DELENA Como, MD  Purpose: Diagnostic/Therapeutic Indications: Sacroiliac joint pain in the lower back and hip area severe enough to impact quality of life or function. Rationale (medical necessity): procedure needed and proper for the diagnosis and/or treatment of Karen Edwards's medical symptoms and needs. 1. Chronic sacroiliac joint pain (Right)   2. 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 Chronic hip pain (3ry area of Pain) (Bilateral) (R>L)   4. Trochanteric bursitis (Right)    Procedure #2:    Type: Hip bursa injection #2   Primary Purpose: Diagnostic Region: Upper (proximal) Femoral Region Level: Hip Joint Target Area: Trochanteric Bursa Approach: posterolateral approach Laterality: Right     1. Chronic hip pain (3ry area of Pain) (Bilateral) (R>L)   2. Trochanteric bursitis (Right)    NAS-11 Pain score:   Pre-procedure: 7 /10   Post-procedure: 1  (burning sensation on right lower back)/10   Note: The patient was found to have a shingles rash on her right buttocks area following a sacral dermatomal distribution.  She indicates that the skin lesions started approximately 3 days ago.  Today we have completed the sacroiliac joint injection and right trochanteric bursa injection but I have also infiltrated the area under the rash so as to treat the shingles.  I will also provide her with antiviral medication to take and we will follow-up with her in approximately 2 weeks.  Were hoping that this will prevent any long-term complications from the shingles such as a postherpetic neuralgia.     Position / Prep / Materials:  Position: Prone  Prep solution: ChloraPrep (2% chlorhexidine  gluconate and 70% isopropyl alcohol) Prep Area: Entire posterior lumbosacral area  Materials:  Tray: Block Needle(s):  Type: Spinal  Gauge (G): 22  Length: 5-in Qty: One (1) per procedure side.  H&P (Pre-op Assessment):  Karen Edwards is a 58 y.o. (year old), female patient, seen today for interventional treatment. She  has a past surgical history that includes Cesarean section; Ankle surgery (Right, 03/02/2014); Hemorrhoid surgery (N/A, 06/16/2015); Cervical spine surgery (02/21/2016); Cesarean section with bilateral tubal ligation (1990); Colonoscopy (04/19/2015); Hysteroscopy with D & C (12/24/2013); Colonoscopy with propofol  (N/A, 05/18/2018); Esophagogastroduodenoscopy (egd) with propofol  (N/A, 05/18/2018); Spinal cord stimulator insertion (N/A, 06/18/2018); Tubal ligation; Lumbar spinal cord simulator revision (N/A, 03/14/2020);  Colonoscopy with propofol  (N/A, 06/25/2023); Colonoscopy with propofol  (N/A, 08/07/2023); and polypectomy (08/07/2023). Karen Edwards has a current medication list which includes the following  prescription(s): acetaminophen , acyclovir, albuterol, alprazolam, atorvastatin , cyanocobalamin , fluoxetine , fluticasone, linaclotide , lisinopril, montelukast, naloxone , ondansetron , oxycodone , [START ON 09/03/2024] oxycodone , [START ON 10/03/2024] oxycodone , pregabalin , promethazine -dextromethorphan, scopolamine, zolpidem , and zolpidem , and the following Facility-Administered Medications: fentanyl , methylprednisolone  acetate, methylprednisolone  acetate, and pentafluoroprop-tetrafluoroeth. Her primarily concern today is the Hip Pain (right)  Initial Vital Signs:  Pulse/HCG Rate: 76ECG Heart Rate: 71 Temp: 97.7 F (36.5 C) Resp: 14 BP: 133/72 SpO2: 97 %  BMI: Estimated body mass index is 28.71 kg/m as calculated from the following:   Height as of this encounter: 5' (1.524 m).   Weight as of this encounter: 147 lb (66.7 kg).  Risk Assessment: Allergies: Reviewed. She is allergic to gabapentin .  Allergy Precautions: None required Coagulopathies: Reviewed. None identified.  Blood-thinner therapy: None at this time Active Infection(s): Reviewed. None identified. Karen Edwards is afebrile  Site Confirmation: Karen Edwards was asked to confirm the procedure and laterality before marking the site Procedure checklist: Completed Consent: Before the procedure and under the influence of no sedative(s), amnesic(s), or anxiolytics, the patient was informed of the treatment options, risks and possible complications. To fulfill our ethical and legal obligations, as recommended by the American Medical Association's Code of Ethics, I have informed the patient of my clinical impression; the nature and purpose of the treatment or procedure; the risks, benefits, and possible complications of the intervention; the alternatives,  including doing nothing; the risk(s) and benefit(s) of the alternative treatment(s) or procedure(s); and the risk(s) and benefit(s) of doing nothing. The patient was provided information about the general risks and possible complications associated with the procedure. These may include, but are not limited to: failure to achieve desired goals, infection, bleeding, organ or nerve damage, allergic reactions, paralysis, and death. In addition, the patient was informed of those risks and complications associated to the procedure, such as failure to decrease pain; infection; bleeding; organ or nerve damage with subsequent damage to sensory, motor, and/or autonomic systems, resulting in permanent pain, numbness, and/or weakness of one or several areas of the body; allergic reactions; (i.e.: anaphylactic reaction); and/or death. Furthermore, the patient was informed of those risks and complications associated with the medications. These include, but are not limited to: allergic reactions (i.e.: anaphylactic or anaphylactoid reaction(s)); adrenal axis suppression; blood sugar elevation that in diabetics may result in ketoacidosis or comma; water  retention that in patients with history of congestive heart failure may result in shortness of breath, pulmonary edema, and decompensation with resultant heart failure; weight gain; swelling or edema; medication-induced neural toxicity; particulate matter embolism and blood vessel occlusion with resultant organ, and/or nervous system infarction; and/or aseptic necrosis of one or more joints. Finally, the patient was informed that Medicine is not an exact science; therefore, there is also the possibility of unforeseen or unpredictable risks and/or possible complications that may result in a catastrophic outcome. The patient indicated having understood very clearly. We have given the patient no guarantees and we have made no promises. Enough time was given to the patient to ask  questions, all of which were answered to the patient's satisfaction. Karen Edwards has indicated that she wanted to continue with the procedure. Attestation: I, the ordering provider, attest that I have discussed with the patient the benefits, risks, side-effects, alternatives, likelihood of achieving goals, and potential problems during recovery for the procedure that I have provided informed consent. Date  Time: 08/12/2024  9:53 AM  Pre-Procedure Preparation:  Monitoring: As per clinic protocol. Respiration, ETCO2, SpO2, BP, heart rate and rhythm monitor placed  and checked for adequate function Safety Precautions: Patient was assessed for positional comfort and pressure points before starting the procedure. Time-out: I initiated and conducted the Time-out before starting the procedure, as per protocol. The patient was asked to participate by confirming the accuracy of the Time Out information. Verification of the correct person, site, and procedure were performed and confirmed by me, the nursing staff, and the patient. Time-out conducted as per Joint Commission's Universal Protocol (UP.01.01.01). Time: 1128 Start Time: 1128 hrs.  Description/Narrative of Procedure No.1:          Start Time: 1128 hrs.  Rationale (medical necessity): procedure needed and proper for the diagnosis and/or treatment of the patient's medical symptoms and needs. Procedural Technique Safety Precautions: Aspiration looking for blood return was conducted prior to all injections. At no point did we inject any substances, as a needle was being advanced. No attempts were made at seeking any paresthesias. Safe injection practices and needle disposal techniques used. Medications properly checked for expiration dates. SDV (single dose vial) medications used. Description of the Procedure: Protocol guidelines were followed. The patient was assisted into a comfortable position. The target area was identified and the area prepped  in the usual manner. Skin & deeper tissues infiltrated with local anesthetic. Appropriate amount of time allowed to pass for local anesthetics to take effect. The procedure needles were then advanced to the target area. Proper needle placement secured. Negative aspiration confirmed. Solution injected in intermittent fashion, asking for systemic symptoms every 0.5cc of injectate. The needles were then removed and the area cleansed, making sure to leave some of the prepping solution back to take advantage of its long term bactericidal properties.  Technical description of procedure:  Fluoroscopy using a posterior anterior 45 degree angle from the midline aiming at the anterolateral aspect of the patient was used to find a direct path into the sacroiliac joint, the superior medial to posterior superior iliac spine.  The skin was marked where the desired target and the skin infiltrated with local anesthetics.  The procedure needle was then advanced until the joint was entered.  Once inside of the joint, we then proceeded to inject the desired solution.  Description of Procedure #2:  Area Prepped: Entire Posterolateral hip area. ChloraPrep (2% chlorhexidine  gluconate and 70% isopropyl alcohol) Safety Precautions: Aspiration looking for blood return was conducted prior to all injections. At no point did we inject any substances, as a needle was being advanced. No attempts were made at seeking any paresthesias. Safe injection practices and needle disposal techniques used. Medications properly checked for expiration dates. SDV (single dose vial) medications used. Description of the Procedure: Protocol guidelines were followed. The patient was placed in position over the procedure table. The target area was identified and the area prepped in the usual manner. Skin & deeper tissues infiltrated with local anesthetic. Appropriate amount of time allowed to pass for local anesthetics to take effect. The procedure needles  were then advanced to the target area. Proper needle placement secured. Negative aspiration confirmed. Solution injected in intermittent fashion, asking for systemic symptoms every 0.5cc of injectate. The needles were then removed and the area cleansed, making sure to leave some of the prepping solution back to take advantage of its long term bactericidal properties.             Materials:  Needle(s) Type: Spinal Needle Gauge: 22G Length: 5.0-in Medication(s): Please see orders for medications and dosing details.  Vitals:   08/12/24 1135 08/12/24 1144 08/12/24  1152 08/12/24 1200  BP: 129/75 (!) 121/59 (!) 115/55 (!) 114/46  Pulse:      Resp: 16 16 12 18   Temp:  97.7 F (36.5 C)  97.6 F (36.4 C)  TempSrc:  Temporal  Temporal  SpO2: 98% 99% 100% 95%  Weight:      Height:         End Time: 1135 hrs.  Imaging Guidance (Non-Spinal) for procedure #1 & #2:  Type of Imaging Technique: Fluoroscopy Guidance (Non-Spinal) Indication(s): Fluoroscopy guidance for needle placement to enhance accuracy in procedures requiring precise needle localization for targeted delivery of medication in or near specific anatomical locations not easily accessible without such real-time imaging assistance. Exposure Time: Please see nurses notes. Contrast: None used. Fluoroscopic Guidance: I was personally present during the use of fluoroscopy. Tunnel Vision Technique used to obtain the best possible view of the target area. Parallax error corrected before commencing the procedure. Direction-depth-direction technique used to introduce the needle under continuous pulsed fluoroscopy. Once target was reached, antero-posterior, oblique, and lateral fluoroscopic projection used confirm needle placement in all planes. Images permanently stored in EMR. Interpretation: No contrast injected. I personally interpreted the imaging intraoperatively. Adequate needle placement confirmed in multiple planes. Permanent  images saved into the patient's record.  Post-operative Assessment:  Post-procedure Vital Signs:  Pulse/HCG Rate: 7663 Temp: 97.6 F (36.4 C) Resp: 18 BP: (!) 114/46 SpO2: 95 %  EBL: None  Complications: No immediate post-treatment complications observed by team, or reported by patient.  Note: The patient tolerated the entire procedure well. A repeat set of vitals were taken after the procedure and the patient was kept under observation following institutional policy, for this type of procedure. Post-procedural neurological assessment was performed, showing return to baseline, prior to discharge. The patient was provided with post-procedure discharge instructions, including a section on how to identify potential problems. Should any problems arise concerning this procedure, the patient was given instructions to immediately contact us , at any time, without hesitation. In any case, we plan to contact the patient by telephone for a follow-up status report regarding this interventional procedure.  Comments:  No additional relevant information.  Plan of Care (POC)  Orders:  Orders Placed This Encounter  Procedures   SACROILIAC JOINT INJECTION    Scheduling Instructions:     Side: Right-sided     Sedation: With Sedation.     Date: 08/12/2024    Where will this procedure be performed?:   [facility]   HIP INJECTION    Purpose: Therapeutic/Diagnostic Indication: Hip pain 2ry to Trochanteric Burlitis right (M70.61).    Scheduling Instructions:     Procedure: Trochanteric bursa injection     Laterality: Right-sided     Sedation: With Sedation.     Date: 08/12/2024   DG PA[facility] C-ARM 1-60 MIN NO REPORT    Intraoperative interpretation by procedural physician at [facility].    Standing Status:   Standing    Number of Occurrences:   1    Reason for exam::   Assistance in needle guidance and placement for procedures requiring needle placement in or near specific  anatomical locations not easily accessible without such assistance.   Informed Consent Details: Physician/Practitioner Attestation; Transcribe to consent form and obtain patient signature    Nursing Order: Transcribe to consent form and obtain patient signature. Note: Always confirm laterality of pain with Karen Edwards, before procedure.    Physician/Practitioner attestation of informed consent for procedure/surgical case:   I,  the physician/practitioner, attest that I have discussed with the patient the benefits, risks, side effects, alternatives, likelihood of achieving goals and potential problems during recovery for the procedure that I have provided informed consent.    Procedure:   Sacroiliac Joint Block    Physician/Practitioner performing the procedure:   Latoy Labriola A. Tanya, MD    Indication/Reason:   Chronic Low Back and Hip Pain secondary to Sacroiliac Joint Pain (Arthralgia/Arthropathy)   Provide equipment / supplies at bedside    Procedure tray: Block Tray (Disposable  single use) Skin infiltration needle: Regular 1.5-in, 25-G, (x1) Block Needle type: Spinal Amount/quantity: 1 Size: Medium (5-inch) Gauge: 22G    Standing Status:   Standing    Number of Occurrences:   1    Specify:   Block Tray   Informed Consent Details: Physician/Practitioner Attestation; Transcribe to consent form and obtain patient signature    Note: Always confirm laterality of pain with Karen Edwards, before procedure. Transcribe to consent form and obtain patient signature.    Physician/Practitioner attestation of informed consent for procedure/surgical case:   I, the physician/practitioner, attest that I have discussed with the patient the benefits, risks, side effects, alternatives, likelihood of achieving goals and potential problems during recovery for the procedure that I have provided informed consent.    Procedure:   Hip bursa injection    Physician/Practitioner performing the procedure:    Apple Dearmas A. Freda Jaquith, MD    Indication/Reason:   Hip bursitis   Provide equipment / supplies at bedside    Procedure tray: Spinal Block Tray (Disposable  single use)    Standing Status:   Standing    Number of Occurrences:   1    Specify:   Spinal Tray   Saline lock IV    Have LR [phone] mL available and administer at 125 mL/hr if patient becomes hypotensive.    Standing Status:   Standing    Number of Occurrences:   1     Opioid Analgesic(s): Oxycodone  IR 5 mg, 1 tab p.o. twice daily PRN (#20/90 days).  MME/day: 0 mg/day.    Medications ordered for procedure: Meds ordered this encounter  Medications   lidocaine  (XYLOCAINE ) 2 % (with pres) injection 400 mg   pentafluoroprop-tetrafluoroeth (GEBAUERS) aerosol   midazolam  (VERSED ) 5 MG/5ML injection 0.5-2 mg    Make sure Flumazenil is available in the pyxis when using this medication. If oversedation occurs, administer 0.2 mg IV over 15 sec. If after 45 sec no response, administer 0.2 mg again over 1 min; may repeat at 1 min intervals; not to exceed 4 doses (1 mg)   fentaNYL  (SUBLIMAZE ) injection 25-50 mcg    Make sure Narcan  is available in the pyxis when using this medication. In the event of respiratory depression (RR< 8/min): Titrate NARCAN  (naloxone ) in increments of 0.1 to 0.2 mg IV at 2-3 minute intervals, until desired degree of reversal.   ropivacaine  (PF) 2 mg/mL (0.2%) (NAROPIN ) injection 9 mL   methylPREDNISolone  acetate (DEPO-MEDROL ) injection 80 mg   methylPREDNISolone  acetate (DEPO-MEDROL ) injection 80 mg   ropivacaine  (PF) 2 mg/mL (0.2%) (NAROPIN ) injection 4 mL   acyclovir (ZOVIRAX) 400 MG tablet    Sig: Take 1 tablet (400 mg total) by mouth 5 (five) times daily for 14 days.    Dispense:  70 tablet    Refill:  0   Medications administered: We administered lidocaine , midazolam , fentaNYL , ropivacaine  (PF) 2 mg/mL (0.2%), and ropivacaine  (PF) 2 mg/mL (0.2%).  See the medical record for  exact dosing, route, and  time of administration.    Interventional Therapies  Risk Factors  Considerations:   Mitral valve prolapse (MVP) GERD  urinary stress incontinence  GAD   Planned  Pending:   (05/26/2024) continue to titrate Lyrica  as tolerated.  Dose increased today from 25 mg at bedtime to 25 mg BID.   Under consideration:      Completed:   Diagnostic/therapeutic bilateral TBI x1 (04/22/2024) (100/100/40/40)  Therapeutic right cervical ESI x3 (03/11/2024) (100/100/100 x 7 days/100% for radicular  45% for axial)  Diagnostic bilateral GONB x1 (08/19/2019) (100/100/100/,50)  Palliative right cervical facet MBB x3 (07/23/2018) (100/100/100/>50)  Palliative left cervical facet MBB x4 (04/01/2019) (90/90/0/0)  Palliative right cervical facet RFA x1 (09/17/2018) (100/100/85/85)  Palliative left cervical facet RFA x1 (05/04/2019) (100/100/50/50)  Therapeutic left L1-2 LESI x1 (05/10/2021) (100/100/100 x 2 days/0)  Therapeutic right L1-2 LESI x2 (02/10/2018) (100/100/70/50-75)  Therapeutic right L1 TFESI x1 (02/10/2018) (100/100/70/50-75)  Diagnostic/therapeutic left superior cluneal NB (L2, L3 dorsal rami) x1 (04/24/2021) (0/0/0/0)  Diagnostic/therapeutic left L1 TFESI x2 (11/15/2021) (100/100/0)  Diagnostic/therapeutic left L3 TFESI x2 (11/15/2021) (100/100/0)  Bilateral lumbar spinal cord stimulator trial (done) (05/14/2018) (by me)  Bilateral lumbar spinal cord stimulator implant (09/28/2019) (by me)  Bilateral spinal cord stimulator revision (03/14/2020) (by me)  Therapeutic bilateral lumbar facet MBB R5L6 (12/02/2023) (100/100/80/80)  Diagnostic bilateral SI joint Blk x2 (09/18/2021) (100/100/100)  Palliative left lumbar facet RFA x3 (06/27/2022) (100/100/100/100)  Therapeutic right lumbar facet RFA x3 (05/21/2022) (100/100/75/75)    Therapeutic  Palliative (PRN) options:   Palliative right cervical facet RFA   Diagnostic bilateral SI joint Blk   Therapeutic bilateral lumbar facet RFA     Pharmacotherapy  Nonopioids transfer 08/23/2020: Lidocaine  5% ointment and Robaxin .       Follow-up plan:   Return in about 2 weeks (around 08/26/2024) for (Face2F), (PPE) & evaluate Right Sacral Shingles.     Recent Visits Date Type Provider Dept  07/21/24 Office Visit Patel, Seema K, NP [facility]  06/23/24 Office Visit Tanya Glisson, MD [facility]  05/26/24 Office Visit Tanya Glisson, MD [facility]  Showing recent visits within past 90 days and meeting all other requirements Today's Visits Date Type Provider Dept  08/12/24 Procedure visit Tanya Glisson, MD [facility]  Showing today's visits and meeting all other requirements Future Appointments Date Type Provider Dept  08/23/24 Appointment Tanya Glisson, MD [facility]  09/21/24 Appointment Patel, Seema K, NP [facility]  Showing future appointments within next 90 days and meeting all other requirements   Disposition: Discharge home  Discharge (Date  Time): 08/12/2024; 1201 hrs.   Primary Care Physician: Arloa Almarie NOVAK, FNP Location: [facility] Note by: Glisson DELENA Tanya, MD (TTS technology used. I apologize for any typographical errors that were not detected and corrected.) Date: 08/12/2024; Time: 12:31 PM  Disclaimer:  Medicine is not an Visual merchandiser. The only guarantee in medicine is that nothing is guaranteed. It is important to note that the decision to proceed with this intervention was based on the information collected from the patient. The Data and conclusions were drawn from the patient's questionnaire, the interview, and the physical examination. Because the information was provided in large part by the patient, it cannot be guaranteed that it has not been purposely or unconsciously manipulated. Every effort has been made to obtain as much relevant data as possible for this evaluation. It is  important to note  that the conclusions that lead to this procedure are derived in large part from the available data. Always take into account that the treatment will also be dependent on availability of resources and existing treatment guidelines, considered by other Pain Management Practitioners as being common knowledge and practice, at the time of the intervention. For Medico-Legal purposes, it is also important to point out that variation in procedural techniques and pharmacological choices are the acceptable norm. The indications, contraindications, technique, and results of the above procedure should only be interpreted and judged by a Board-Certified Interventional Pain Specialist with extensive familiarity and expertise in the same exact procedure and technique.

## 2024-08-13 ENCOUNTER — Telehealth: Payer: Self-pay

## 2024-08-13 NOTE — Telephone Encounter (Signed)
 Post Procedure Call, patient states that she is a little sore, otherwise doing well. Encouraged to call with concerns.

## 2024-08-16 ENCOUNTER — Other Ambulatory Visit: Payer: Self-pay | Admitting: Pain Medicine

## 2024-08-16 DIAGNOSIS — G8929 Other chronic pain: Secondary | ICD-10-CM

## 2024-08-16 DIAGNOSIS — M792 Neuralgia and neuritis, unspecified: Secondary | ICD-10-CM

## 2024-08-16 DIAGNOSIS — R52 Pain, unspecified: Secondary | ICD-10-CM

## 2024-08-19 NOTE — Progress Notes (Signed)
 Department: [facility] at [facility]) Date: 08/23/2024  Event: Canceled/Rescheduled by patient.  Encounter Type: (PPE) Post-procedure evaluation.          Advance notice: Less than 24 hr notice.  Reason: Scheduling conflict. Unable to get sitter for Mother. Note: Patient will R/S.

## 2024-08-23 ENCOUNTER — Ambulatory Visit (HOSPITAL_BASED_OUTPATIENT_CLINIC_OR_DEPARTMENT_OTHER): Admitting: Pain Medicine

## 2024-08-23 DIAGNOSIS — M7061 Trochanteric bursitis, right hip: Secondary | ICD-10-CM

## 2024-08-23 DIAGNOSIS — G8929 Other chronic pain: Secondary | ICD-10-CM

## 2024-08-23 DIAGNOSIS — Z91199 Patient's noncompliance with other medical treatment and regimen due to unspecified reason: Secondary | ICD-10-CM

## 2024-08-23 DIAGNOSIS — Z09 Encounter for follow-up examination after completed treatment for conditions other than malignant neoplasm: Secondary | ICD-10-CM

## 2024-08-30 ENCOUNTER — Telehealth: Payer: Self-pay

## 2024-08-30 ENCOUNTER — Telehealth: Payer: Self-pay | Admitting: Nurse Practitioner

## 2024-08-30 ENCOUNTER — Other Ambulatory Visit: Payer: Self-pay

## 2024-08-30 DIAGNOSIS — M792 Neuralgia and neuritis, unspecified: Secondary | ICD-10-CM

## 2024-08-30 DIAGNOSIS — R52 Pain, unspecified: Secondary | ICD-10-CM

## 2024-08-30 DIAGNOSIS — G8929 Other chronic pain: Secondary | ICD-10-CM

## 2024-08-30 MED ORDER — PREGABALIN 50 MG PO CAPS: 50.0000 mg | ORAL_CAPSULE | Freq: Two times a day (BID) | ORAL | 0 refills | Status: DC

## 2024-08-30 NOTE — Telephone Encounter (Signed)
 Called patient to see what kind of problem she is having with the oxycodone . LM

## 2024-08-30 NOTE — Telephone Encounter (Signed)
 Patient was here for MM visit and did not get scripts for Pregabalin  to fill in Nov and Dec. Please check on this. Also states she is having trouble with Oxycodone .

## 2024-08-31 NOTE — Telephone Encounter (Signed)
 Script for Pregabalin  was sent yesterday, patient states she got it. Also reports no problem with Oxycodone .

## 2024-09-01 ENCOUNTER — Other Ambulatory Visit: Payer: Self-pay | Admitting: Pain Medicine

## 2024-09-01 DIAGNOSIS — M792 Neuralgia and neuritis, unspecified: Secondary | ICD-10-CM

## 2024-09-01 DIAGNOSIS — R52 Pain, unspecified: Secondary | ICD-10-CM

## 2024-09-01 DIAGNOSIS — G8929 Other chronic pain: Secondary | ICD-10-CM

## 2024-09-07 NOTE — Progress Notes (Unsigned)
 PROVIDER NOTE: Interpretation of information contained herein should be left to medically-trained personnel. Specific patient instructions are provided elsewhere under Patient Instructions section of medical record. This document was created in part using AI and STT-dictation technology, any transcriptional errors that may result from this process are unintentional.  Patient: Karen Edwards  Service: E/M   PCP: Arloa Almarie NOVAK, FNP  DOB: 03/13/1966  DOS: 09/08/2024  Provider: Eric DELENA Como, MD  MRN: [id number]  Delivery: Face-to-face  Specialty: Interventional Pain Management  Type: Established Patient  Setting: Ambulatory outpatient facility  Specialty designation: 09  Referring Prov.: Arloa Almarie NOVAK, FNP  Location: Outpatient office facility       History of present illness (HPI) Karen Edwards, a 58 y.o. year old female, is here today because of her Chronic pain of both hips [M25.551, M25.552, G89.29]. [name] primary complain today is No chief complaint on file.  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); Chronic neck pain (4th area of Pain) (Bilateral) (R>L); Numbness and tingling of upper extremity (Right); Chronic upper extremity pain (Bilateral) (R>L); Chronic sacroiliac joint pain (Bilateral) (L>R); Lumbar facet hypertrophy (Bilateral); L1-2 disc extrusion (Right); Cervical foraminal stenosis (C5-6) (Bilateral); Chronic pain syndrome; Chronic cervical radicular pain (Right: C6/C7) (Left: C5/T1); Chronic lumbar radicular pain; Chronic hip pain (3ry area of Pain) (Bilateral)  (R>L); Chronic lower extremity pain (2ry area of Pain) (Bilateral) (R>L); Chronic shoulder pain (Bilateral) (L>R); Chronic musculoskeletal pain; Lumbar spondylosis; Cervical facet syndrome (Bilateral) (R>L); Myofascial pain; Spondylosis without myelopathy or radiculopathy, cervical region; Cervicalgia; S/P insertion of Epidural Neurostimulator (SCS) (Bilateral); Chronic neck pain with history of cervical spinal surgery; Inflammatory spondylopathy of cervical region; Trigger point with back pain (Right); Cervicogenic headache (Bilateral); Occipital headache (Bilateral); Chronic tension-type headache, intractable; Cervico-occipital neuralgia (Bilateral); Pain due to any device, implant or graft (SCS battery) (Left PSIS area); Spondylosis without myelopathy or radiculopathy, lumbosacral region; History of cervical spinal surgery (C5-7 ACDF); Cervical radiculopathy at C6 (Bilateral); Chronic low back pain (Bilateral) w/o sciatica; Presence of neurostimulator (Thoracolumbar); Spinal cord stimulator dysfunction; Neurogenic pain; Chronic low back pain (Left) w/o sciatica; Cluneal neuropathy (Left); Abnormal MRI, lumbar spine (04/17/2021); Enthesopathy of hip region (Bilateral); Protrusion of lumbar intervertebral disc (Right: L1-2, L3-4); Lumbar lateral recess stenosis (Bilateral: L2-3); Lumbar foraminal stenosis (Bilateral: L4-5) (Right: L3-4); Right rib fracture; Chronic sacroiliac joint pain (Right); Radicular pain of shoulder (Bilateral); Lumbar facet joint pain; Mid-back pain, acute; Neurostimulator device in situ; Trochanteric bursitis (Left); Burning pain; Neuropathic pain; Trochanteric bursitis (Right); Arthritis; Sprain of ankle; Shingles rash; Herpes zoster infection; and Herpes zoster involving sacral dermatome (Right) on their pertinent problem list.  Pain Assessment: Severity of   is reported as a  /10. Location:    / . Onset:  . Quality:  . Timing:  . Modifying factor(s):  SABRA Vitals:  vitals were not taken  for this visit.  BMI: Estimated body mass index is 28.71 kg/m as calculated from the following:   Height as of 08/12/24: 5' (1.524 m).   Weight as of 08/12/24: 147 lb (66.7 kg).  Last encounter: 08/23/2024. Last procedure: 08/12/2024.  Reason for encounter: post-procedure evaluation and assessment.   Discussed the use of AI scribe software  for clinical note transcription with the patient, who gave verbal consent to proceed.  History of Present Illness          Post-Procedure Evaluation   Procedure No.1: Sacroiliac Joint Steroid Injection #3    Laterality: Right     Level: PSIS (Posterior Superior Iliac Spine)  Target: Interarticular sacroiliac joint. Location: Medial to the postero-medial edge of iliac spine. Region: Lumbosacral-sacrococcygeal. Approach: Inferior postero-medial percutaneous approach. Type of procedure: Percutaneous joint injection. Imaging: Fluoroscopy-guided Non-spinal ([id number]) Anesthesia: Local anesthesia (1-2% Lidocaine ) Anxiolysis: IV Versed  3.0 mg Sedation: Minimal Sedation Fentanyl  1 mL (50 mcg) DOS: 08/12/2024  Performed by: Eric DELENA Como, MD  Purpose: Diagnostic/Therapeutic Indications: Sacroiliac joint pain in the lower back and hip area severe enough to impact quality of life or function. Rationale (medical necessity): procedure needed and proper for the diagnosis and/or treatment of Karen Edwards's medical symptoms and needs. 1. Chronic sacroiliac joint pain (Right)   2. 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 Chronic hip pain (3ry area of Pain) (Bilateral) (R>L)   4. Trochanteric bursitis (Right)    Procedure #2:    Type: Hip bursa injection #2  Primary Purpose: Diagnostic Region: Upper (proximal) Femoral Region Level: Hip Joint Target Area: Trochanteric Bursa Approach: posterolateral approach Laterality: Right     1. Chronic hip pain (3ry area of Pain) (Bilateral) (R>L)   2. Trochanteric bursitis (Right)    NAS-11  Pain score:   Pre-procedure: 7 /10   Post-procedure: 1  (burning sensation on right lower back)/10   Note: The patient was found to have a shingles rash on her right buttocks area following a sacral dermatomal distribution.  She indicates that the skin lesions started approximately 3 days ago.  Today we have completed the sacroiliac joint injection and right trochanteric bursa injection but I have also infiltrated the area under the rash so as to treat the shingles.  I will also provide her with antiviral medication to take and we will follow-up with her in approximately 2 weeks.  Were hoping that this will prevent any long-term complications from the shingles such as a postherpetic neuralgia.    Effectiveness:  Initial hour after procedure:   ***. Subsequent 4-6 hours post-procedure:   ***. Analgesia past initial 6 hours:   ***. Ongoing improvement:  Analgesic:  *** Function:    ***    ROM:    ***     Pharmacotherapy Assessment   Opioid Analgesic(s): Oxycodone  IR 5 mg, 1 tab p.o. twice daily PRN (#20/90 days).  MME/day: 0 mg/day.   Monitoring: [place] PMP: PDMP not reviewed this encounter.       Pharmacotherapy: No side-effects or adverse reactions reported. Compliance: No problems identified. Effectiveness: Clinically acceptable.  No notes on file  UDS:  Summary  Date Value Ref Range Status  07/21/2024 FINAL  Final    Comment:    ==================================================================== [employer] 13 (MW) ==================================================================== Test                             Result       Flag       Units  Drug Present and Declared for Prescription Verification   Alprazolam                     117          EXPECTED   ng/mg creat   Alpha-hydroxyalprazolam  138          EXPECTED   ng/mg creat    Source of alprazolam is a scheduled prescription medication. Alpha-    hydroxyalprazolam is an expected metabolite of alprazolam.     Noroxycodone                   503          EXPECTED   ng/mg creat    Noroxycodone is an expected metabolite of oxycodone . Sources of    oxycodone  include scheduled prescription medications.  Drug Absent but Declared for Prescription Verification   Oxycodone                       Not Detected UNEXPECTED ng/mg creat    Oxycodone  is almost always present in patients taking this drug    consistently.  Absence of oxycodone  could be due to lapse of time    since the last dose or unusual pharmacokinetics (rapid metabolism).  ==================================================================== Test                      Result    Flag   Units      Ref Range   Creatinine              29               mg/dL      >=79 ==================================================================== Declared Medications:  The flagging and interpretation on this report are based on the  following declared medications.  Unexpected results may arise from  inaccuracies in the declared medications.   **Note: The testing scope of this panel includes these medications:   Alprazolam (Xanax)  Oxycodone    **Note: The testing scope of this panel does not include the  following reported medications:   Acetaminophen  (Tylenol )  Albuterol (Ventolin HFA)  Atorvastatin  (Lipitor)  Dextromethorphan  Fluoxetine  (Prozac )  Fluticasone (Flonase)  Linaclotide  (Linzess )  Lisinopril (Zestril)  Montelukast (Singulair)  Naloxone  (Narcan )  Ondansetron  (Zofran )  Pregabalin  (Lyrica )  Promethazine   Scopolamine  Vitamin B12  Zolpidem  (Ambien ) ==================================================================== For clinical consultation, please call [phone]. ====================================================================     No results found for: CBDTHCR No results found for: D8THCCBX No results found for: D9THCCBX  ROS  Constitutional: Denies any fever or chills Gastrointestinal: No reported hemesis,  hematochezia, vomiting, or acute GI distress Musculoskeletal: Denies any acute onset joint swelling, redness, loss of ROM, or weakness Neurological: No reported episodes of acute onset apraxia, aphasia, dysarthria, agnosia, amnesia, paralysis, loss of coordination, or loss of consciousness  Medication Review  ALPRAZolam, FLUoxetine , acetaminophen , albuterol, atorvastatin , cyanocobalamin , fluticasone, linaclotide , lisinopril, montelukast, naloxone , ondansetron , oxyCODONE , pregabalin , promethazine -dextromethorphan, scopolamine, and zolpidem   History Review  Allergy: Karen Edwards is allergic to gabapentin . Drug: Karen Edwards  reports no history of drug use. Alcohol:  reports current alcohol use of about 1.0 standard drink of alcohol per week. Tobacco:  reports that she has been smoking cigarettes. She has a 18.5 pack-year smoking history. She has never used smokeless tobacco. Social: Karen Edwards  reports that she has been smoking cigarettes. She has a 18.5 pack-year smoking history. She has never used smokeless tobacco. She reports current alcohol use of about 1.0 standard drink of alcohol per week. She reports that she does not use drugs. Medical:  has a past medical history of Anxiety, Chronic back pain, Degenerative joint disease, Depression, Headache, Hypotension, IBS (irritable bowel syndrome) (05/2015), MVP (mitral valve prolapse), and Nausea and vomiting.  Surgical: Karen Edwards  has a past surgical history that includes Cesarean section; Ankle surgery (Right, 03/02/2014); Hemorrhoid surgery (N/A, 06/16/2015); Cervical spine surgery (02/21/2016); Cesarean section with bilateral tubal ligation (1990); Colonoscopy (04/19/2015); Hysteroscopy with D & C (12/24/2013); Colonoscopy with propofol  (N/A, 05/18/2018); Esophagogastroduodenoscopy (egd) with propofol  (N/A, 05/18/2018); Spinal cord stimulator insertion (N/A, 06/18/2018); Tubal ligation; Lumbar spinal cord stimulator revision (N/A, 03/14/2020);  Colonoscopy with propofol  (N/A, 06/25/2023); Colonoscopy with propofol  (N/A, 08/07/2023); and polypectomy (08/07/2023). Family: family history includes Arthritis in her mother; Congestive Heart Failure in her maternal grandfather; Depression in her mother; Diabetes in her maternal aunt; Diabetes Mellitus I in her maternal grandmother; Gout in her maternal aunt; Hearing loss in her father; Heart attack in her maternal grandfather; Hyperlipidemia in her father and mother; Hypertension in her father and mother; Hypothyroidism in her mother; Kidney disease in her mother; Prostate cancer in her paternal grandfather; Stroke in her maternal grandmother; Thyroid  disease in her maternal aunt; Vision loss in her mother.  Laboratory Chemistry Profile   Renal Lab Results  Component Value Date   BUN 12 02/01/2021   CREATININE 0.85 02/01/2021   BCR 8 (L) 09/08/2017   GFR 77.34 02/01/2021   GFRAA 93 09/08/2017   GFRNONAA 81 09/08/2017    Hepatic Lab Results  Component Value Date   AST 15 02/01/2021   ALT 11 02/01/2021   ALBUMIN 4.4 02/01/2021   ALKPHOS 59 02/01/2021    Electrolytes Lab Results  Component Value Date   NA 139 02/01/2021   K 4.2 02/01/2021   CL 105 02/01/2021   CALCIUM  9.3 02/01/2021   MG 2.1 09/08/2017    Bone Lab Results  Component Value Date   VD25OH 43.74 01/10/2016   25OHVITD1 51 09/08/2017   25OHVITD2 <1.0 09/08/2017   25OHVITD3 51 09/08/2017    Inflammation (CRP: Acute Phase) (ESR: Chronic Phase) Lab Results  Component Value Date   CRP 1.4 09/08/2017   ESRSEDRATE 11 09/08/2017         Note: Above Lab results reviewed.  Recent Imaging Review  DG PAIN CLINIC C-ARM 1-60 MIN NO REPORT Fluoro was used, but no Radiologist interpretation will be provided.  Please refer to NOTES tab for provider progress note. Note: Reviewed        Physical Exam  Vitals: LMP 05/05/2017 (Exact Date)  BMI: Estimated body mass index is 28.71 kg/m as calculated from the  following:   Height as of 08/12/24: 5' (1.524 m).   Weight as of 08/12/24: 147 lb (66.7 kg). Ideal: Patient weight not recorded General appearance: Well nourished, well developed, and well hydrated. In no apparent acute distress Mental status: Alert, oriented x 3 (person, place, & time)       Respiratory: No evidence of acute respiratory distress Eyes: PERLA   Assessment   Diagnosis Status  1. Chronic hip pain (3ry area of Pain) (Bilateral) (R>L)    Controlled Controlled Controlled   Updated Problems: No problems updated.  Plan of Care  Problem-specific:  Assessment and Plan            Karen Edwards has a current medication list which includes the following long-term medication(s): albuterol, fluoxetine , fluticasone, linaclotide , lisinopril, montelukast, oxycodone , oxycodone , [START ON 10/03/2024] oxycodone , and pregabalin .  Pharmacotherapy (Medications Ordered): No orders of the defined types were placed in this encounter.  Orders:  No orders of the defined types were placed in this encounter.    Interventional Therapies  Risk Factors  Considerations:   Mitral valve  prolapse (MVP) GERD  urinary stress incontinence  GAD   Planned  Pending:   (05/26/2024) continue to titrate Lyrica  as tolerated.  Dose increased today from 25 mg at bedtime to 25 mg BID.   Under consideration:      Completed:   Diagnostic/therapeutic bilateral TBI x1 (04/22/2024) (100/100/40/40)  Therapeutic right cervical ESI x3 (03/11/2024) (100/100/100 x 7 days/100% for radicular  45% for axial)  Diagnostic bilateral GONB x1 (08/19/2019) (100/100/100/,50)  Palliative right cervical facet MBB x3 (07/23/2018) (100/100/100/>50)  Palliative left cervical facet MBB x4 (04/01/2019) (90/90/0/0)  Palliative right cervical facet RFA x1 (09/17/2018) (100/100/85/85)  Palliative left cervical facet RFA x1 (05/04/2019) (100/100/50/50)  Therapeutic left L1-2 LESI x1 (05/10/2021) (100/100/100 x 2 days/0)   Therapeutic right L1-2 LESI x2 (02/10/2018) (100/100/70/50-75)  Therapeutic right L1 TFESI x1 (02/10/2018) (100/100/70/50-75)  Diagnostic/therapeutic left superior cluneal NB (L2, L3 dorsal rami) x1 (04/24/2021) (0/0/0/0)  Diagnostic/therapeutic left L1 TFESI x2 (11/15/2021) (100/100/0)  Diagnostic/therapeutic left L3 TFESI x2 (11/15/2021) (100/100/0)  Bilateral lumbar spinal cord stimulator trial (done) (05/14/2018) (by me)  Bilateral lumbar spinal cord stimulator implant (09/28/2019) (by me)  Bilateral spinal cord stimulator revision (03/14/2020) (by me)  Therapeutic bilateral lumbar facet MBB R5L6 (12/02/2023) (100/100/80/80)  Diagnostic bilateral SI joint Blk x2 (09/18/2021) (100/100/100)  Palliative left lumbar facet RFA x3 (06/27/2022) (100/100/100/100)  Therapeutic right lumbar facet RFA x3 (05/21/2022) (100/100/75/75)    Therapeutic  Palliative (PRN) options:   Palliative right cervical facet RFA   Diagnostic bilateral SI joint Blk   Therapeutic bilateral lumbar facet RFA    Pharmacotherapy  Nonopioids transfer 08/23/2020: Lidocaine  5% ointment and Robaxin .      No follow-ups on file.    Recent Visits Date Type Provider Dept  08/12/24 Procedure visit Tanya Glisson, MD [facility]  07/21/24 Office Visit Patel, Seema K, NP [facility]  06/23/24 Office Visit Tanya Glisson, MD [facility]  Showing recent visits within past 90 days and meeting all other requirements Future Appointments Date Type Provider Dept  09/08/24 Appointment Tanya Glisson, MD [facility]  09/21/24 Appointment Patel, Seema K, NP [facility]  Showing future appointments within next 90 days and meeting all other requirements  I discussed the assessment and treatment plan with the patient. The patient was provided an opportunity to ask questions and all were answered. The patient agreed with the plan and demonstrated an understanding of the  instructions.  Patient advised to call back or seek an in-person evaluation if the symptoms or condition worsens.  Duration of encounter: *** minutes.  Total time on encounter, as per AMA guidelines included both the face-to-face and non-face-to-face time personally spent by the physician and/or other qualified health care professional(s) on the day of the encounter (includes time in activities that require the physician or other qualified health care professional and does not include time in activities normally performed by clinical staff). Physician's time may include the following activities when performed: Preparing to see the patient (e.g., pre-charting review of records, searching for previously ordered imaging, lab work, and nerve conduction tests) Review of prior analgesic pharmacotherapies. Reviewing PMP Interpreting ordered tests (e.g., lab work, imaging, nerve conduction tests) Performing post-procedure evaluations, including interpretation of diagnostic procedures Obtaining and/or reviewing separately obtained history Performing a medically appropriate examination and/or evaluation Counseling and educating the patient/family/caregiver Ordering medications, tests, or procedures Referring and communicating with other health care professionals (when not separately reported) Documenting clinical information in the electronic or other health record Independently  interpreting results (not separately reported) and communicating results to the patient/ family/caregiver Care coordination (not separately reported)  Note by: Eric DELENA Como, MD (TTS and AI technology used. I apologize for any typographical errors that were not detected and corrected.) Date: 09/08/2024; Time: 9:05 PM

## 2024-09-08 ENCOUNTER — Encounter: Payer: Self-pay | Admitting: Pain Medicine

## 2024-09-08 ENCOUNTER — Ambulatory Visit: Attending: Pain Medicine | Admitting: Pain Medicine

## 2024-09-08 VITALS — BP 128/77 | HR 78 | Temp 97.6°F | Resp 16 | Ht 60.0 in | Wt 140.0 lb

## 2024-09-08 DIAGNOSIS — M533 Sacrococcygeal disorders, not elsewhere classified: Secondary | ICD-10-CM | POA: Diagnosis not present

## 2024-09-08 DIAGNOSIS — M47817 Spondylosis without myelopathy or radiculopathy, lumbosacral region: Secondary | ICD-10-CM | POA: Insufficient documentation

## 2024-09-08 DIAGNOSIS — R937 Abnormal findings on diagnostic imaging of other parts of musculoskeletal system: Secondary | ICD-10-CM | POA: Diagnosis present

## 2024-09-08 DIAGNOSIS — R52 Pain, unspecified: Secondary | ICD-10-CM | POA: Diagnosis present

## 2024-09-08 DIAGNOSIS — M545 Low back pain, unspecified: Secondary | ICD-10-CM | POA: Diagnosis present

## 2024-09-08 DIAGNOSIS — M47816 Spondylosis without myelopathy or radiculopathy, lumbar region: Secondary | ICD-10-CM | POA: Insufficient documentation

## 2024-09-08 DIAGNOSIS — M25551 Pain in right hip: Secondary | ICD-10-CM | POA: Insufficient documentation

## 2024-09-08 DIAGNOSIS — M792 Neuralgia and neuritis, unspecified: Secondary | ICD-10-CM | POA: Diagnosis present

## 2024-09-08 DIAGNOSIS — Z09 Encounter for follow-up examination after completed treatment for conditions other than malignant neoplasm: Secondary | ICD-10-CM | POA: Insufficient documentation

## 2024-09-08 DIAGNOSIS — M5459 Other low back pain: Secondary | ICD-10-CM | POA: Insufficient documentation

## 2024-09-08 DIAGNOSIS — G8929 Other chronic pain: Secondary | ICD-10-CM | POA: Insufficient documentation

## 2024-09-08 DIAGNOSIS — M7061 Trochanteric bursitis, right hip: Secondary | ICD-10-CM | POA: Insufficient documentation

## 2024-09-08 NOTE — Patient Instructions (Signed)
 ______________________________________________________________________    Procedure instructions  Stop blood-thinners  Do not eat or drink fluids (other than water ) for 6 hours before your procedure  No water  for 2 hours before your procedure  Take your blood pressure medicine with a sip of water   Arrive 30 minutes before your appointment  If sedation is planned, bring suitable driver. [place], [place], & public transportation are NOT APPROVED)  Carefully read the Preparing for your procedure detailed instructions  If you have questions call us  at [phone]  Procedure appointments are for procedures only.   NO medication refills or new problem evaluations will be done on procedure days.   Only the scheduled, pre-approved procedure and side will be done.   ______________________________________________________________________     ______________________________________________________________________    Preparing for your procedure  Appointments: If you think you may not be able to keep your appointment, call 24-48 hours in advance to cancel. We need time to make it available to others.  Procedure visits are for procedures only. During your procedure appointment there will be: NO Prescription Refills*. NO medication changes or discussions*. NO discussion of disability issues*. NO unrelated pain problem evaluations*. NO evaluations to order other pain procedures*. *These will be addressed at a separate and distinct evaluation encounter on the provider's evaluation schedule and not during procedure days.  Instructions: Food intake: Avoid eating anything solid for at least 8 hours prior to your procedure. Clear liquid intake: You may take clear liquids such as water  up to 2 hours prior to your procedure. (No carbonated drinks. No soda.) Transportation: Unless otherwise stated by your physician, bring a driver. (Driver cannot be a Market researcher, Pharmacist, community, or any other form of public  transportation.) Morning Medicines: Except for blood thinners, take all of your other morning medications with a sip of water . Make sure to take your heart and blood pressure medicines. If your blood pressure's lower number is above 100, the case will be rescheduled. Blood thinners: Make sure to stop your blood thinners as instructed.  If you take a blood thinner, but were not instructed to stop it, call our office [phone] and ask to talk to a nurse. Not stopping a blood thinner prior to certain procedures could lead to serious complications. Diabetics on insulin : Notify the staff so that you can be scheduled 1st case in the morning. If your diabetes requires high dose insulin , take only  of your normal insulin  dose the morning of the procedure and notify the staff that you have done so. Preventing infections: Shower with an antibacterial soap the morning of your procedure.  Build-up your immune system: Take 1000 mg of Vitamin C with every meal (3 times a day) the day prior to your procedure. Antibiotics: Inform the nursing staff if you are taking any antibiotics or if you have any conditions that may require antibiotics prior to procedures. (Example: recent joint implants)   Pregnancy: If you are pregnant make sure to notify the nursing staff. Not doing so may result in injury to the fetus, including death.  Sickness: If you have a cold, fever, or any active infections, call and cancel or reschedule your procedure. Receiving steroids while having an infection may result in complications. Arrival: You must be in the facility at least 30 minutes prior to your scheduled procedure. Tardiness: Your scheduled time is also the cutoff time. If you do not arrive at least 15 minutes prior to your procedure, you will be rescheduled.  Children: Do not bring any children with  you. Make arrangements to keep them home. Dress appropriately: There is always a possibility that your clothing may get soiled. Avoid  long dresses. Valuables: Do not bring any jewelry or valuables.  Reasons to call and reschedule or cancel your procedure: (Following these recommendations will minimize the risk of a serious complication.) Surgeries: Avoid having procedures within 2 weeks of any surgery. (Avoid for 2 weeks before or after any surgery). Flu Shots: Avoid having procedures within 2 weeks of a flu shots or . (Avoid for 2 weeks before or after immunizations). Barium: Avoid having a procedure within 7-10 days after having had a radiological study involving the use of radiological contrast. (Myelograms, Barium swallow or enema study). Heart attacks: Avoid any elective procedures or surgeries for the initial 6 months after a Myocardial Infarction (Heart Attack). Blood thinners: It is imperative that you stop these medications before procedures. Let us  know if you if you take any blood thinner.  Infection: Avoid procedures during or within two weeks of an infection (including chest colds or gastrointestinal problems). Symptoms associated with infections include: Localized redness, fever, chills, night sweats or profuse sweating, burning sensation when voiding, cough, congestion, stuffiness, runny nose, sore throat, diarrhea, nausea, vomiting, cold or Flu symptoms, recent or current infections. It is specially important if the infection is over the area that we intend to treat. Heart and lung problems: Symptoms that may suggest an active cardiopulmonary problem include: cough, chest pain, breathing difficulties or shortness of breath, dizziness, ankle swelling, uncontrolled high or unusually low blood pressure, and/or palpitations. If you are experiencing any of these symptoms, cancel your procedure and contact your primary care physician for an evaluation.  Remember:  Regular Business hours are:  Monday to Thursday 8:00 AM to 4:00 PM  Provider's Schedule: Eric Como, MD:  Procedure days: Tuesday and Thursday 7:30  AM to 4:00 PM  Wallie Sherry, MD:  Procedure days: Monday and Wednesday 7:30 AM to 4:00 PM Last  Updated: 10/07/2023 ______________________________________________________________________     ______________________________________________________________________    General Risks and Possible Complications  Patient Responsibilities: It is important that you read this as it is part of your informed consent. It is our duty to inform you of the risks and possible complications associated with treatments offered to you. It is your responsibility as a patient to read this and to ask questions about anything that is not clear or that you believe was not covered in this document.  Patient's Rights: You have the right to refuse treatment. You also have the right to change your mind, even after initially having agreed to have the treatment done. However, under this last option, if you wait until the last second to change your mind, you may be charged for the materials used up to that point.  Introduction: Medicine is not an Visual merchandiser. Everything in Medicine, including the lack of treatment(s), carries the potential for danger, harm, or loss (which is by definition: Risk). In Medicine, a complication is a secondary problem, condition, or disease that can aggravate an already existing one. All treatments carry the risk of possible complications. The fact that a side effects or complications occurs, does not imply that the treatment was conducted incorrectly. It must be clearly understood that these can happen even when everything is done following the highest safety standards.  No treatment: You can choose not to proceed with the proposed treatment alternative. The "PRO(s)" would include: avoiding the risk of complications associated with the therapy. The "CON(s)" would include:  not getting any of the treatment benefits. These benefits fall under one of three categories: diagnostic; therapeutic; and/or  palliative. Diagnostic benefits include: getting information which can ultimately lead to improvement of the disease or symptom(s). Therapeutic benefits are those associated with the successful treatment of the disease. Finally, palliative benefits are those related to the decrease of the primary symptoms, without necessarily curing the condition (example: decreasing the pain from a flare-up of a chronic condition, such as incurable terminal cancer).  General Risks and Complications: These are associated to most interventional treatments. They can occur alone, or in combination. They fall under one of the following six (6) categories: no benefit or worsening of symptoms; bleeding; infection; nerve damage; allergic reactions; and/or death. No benefits or worsening of symptoms: In Medicine there are no guarantees, only probabilities. No healthcare provider can ever guarantee that a medical treatment will work, they can only state the probability that it may. Furthermore, there is always the possibility that the condition may worsen, either directly, or indirectly, as a consequence of the treatment. Bleeding: This is more common if the patient is taking a blood thinner, either prescription or over the counter (example: Goody Powders, Fish oil, Aspirin, Garlic, etc.), or if suffering a condition associated with impaired coagulation (example: Hemophilia, cirrhosis of the liver, low platelet counts, etc.). However, even if you do not have one on these, it can still happen. If you have any of these conditions, or take one of these drugs, make sure to notify your treating physician. Infection: This is more common in patients with a compromised immune system, either due to disease (example: diabetes, cancer, human immunodeficiency virus [HIV], etc.), or due to medications or treatments (example: therapies used to treat cancer and rheumatological diseases). However, even if you do not have one on these, it can still  happen. If you have any of these conditions, or take one of these drugs, make sure to notify your treating physician. Nerve Damage: This is more common when the treatment is an invasive one, but it can also happen with the use of medications, such as those used in the treatment of cancer. The damage can occur to small secondary nerves, or to large primary ones, such as those in the spinal cord and brain. This damage may be temporary or permanent and it may lead to impairments that can range from temporary numbness to permanent paralysis and/or brain death. Allergic Reactions: Any time a substance or material comes in contact with our body, there is the possibility of an allergic reaction. These can range from a mild skin rash (contact dermatitis) to a severe systemic reaction (anaphylactic reaction), which can result in death. Death: In general, any medical intervention can result in death, most of the time due to an unforeseen complication. ______________________________________________________________________      ______________________________________________________________________    Steroid injections  Common steroids for injections Triamcinolone: Used by many sports medicine physicians for large joint and bursal injections, often combined with a local anesthetic like lidocaine . A study focusing on coccydynia (tailbone pain) found triamcinolone was more effective than betamethasone , suggesting it may also be preferable for other localized inflammation conditions. Methylprednisolone: A common alternative to triamcinolone that is also a strong anti-inflammatory. It is available in different formulations, with the acetate suspension being the long-acting option for intra-articular injections. Dexamethasone : This is a non-particulate steroid, meaning it has a lower risk of tissue damage compared to particulate steroids like triamcinolone and methylprednisolone. While less common for this specific  use,  it is an option for targeted injections.   Considerations for physicians Particulate vs. non-particulate steroids: Triamcinolone and methylprednisolone are particulate, meaning they can clump together. Dexamethasone  is non-particulate. Particulate steroids are often preferred for their longer-lasting effects but carry a theoretical higher risk for certain injections (though this is less of a concern in the costochondral joints). Combined injectate: Corticosteroids are typically mixed with a local anesthetic like lidocaine  to provide both immediate pain relief (from the anesthetic) and longer-term inflammation reduction (from the steroid). Imaging guidance: To ensure accurate placement of the needle and medication, physicians may use ultrasound or fluoroscopic guidance for the injection, especially in complex or refractory cases.   Patient guidance Before undergoing a steroid injection, discuss the options with your physician. They will determine the best steroid, dosage, and procedure for your specific case based on factors like: Severity of your condition History of response to other treatments Your overall health status Experience and preference of the physician  Last  Updated: 06/22/2024 ______________________________________________________________________

## 2024-09-08 NOTE — Progress Notes (Signed)
 Safety precautions to be maintained throughout the outpatient stay will include: orient to surroundings, keep bed in low position, maintain call bell within reach at all times, provide assistance with transfer out of bed and ambulation.

## 2024-09-13 ENCOUNTER — Telehealth: Payer: Self-pay

## 2024-09-13 NOTE — Telephone Encounter (Signed)
 I scheduled this patient for lumbar facets on 11/25 with Dr. Seretha She has a med refill appt that day with Seema. Can Dr. Tanya go ahead and write her meds so she doesn't have 2 appointments that day? Her procedure is at 9, her med refll at 66. But she is getting full sedation.

## 2024-09-21 ENCOUNTER — Encounter: Payer: Self-pay | Admitting: Pain Medicine

## 2024-09-21 ENCOUNTER — Ambulatory Visit (HOSPITAL_BASED_OUTPATIENT_CLINIC_OR_DEPARTMENT_OTHER): Admitting: Pain Medicine

## 2024-09-21 ENCOUNTER — Ambulatory Visit
Admission: RE | Admit: 2024-09-21 | Discharge: 2024-09-21 | Disposition: A | Discharge status: Home / Self Care | Source: Ambulatory Visit | Attending: Pain Medicine | Admitting: Pain Medicine

## 2024-09-21 ENCOUNTER — Encounter: Admitting: Nurse Practitioner

## 2024-09-21 VITALS — BP 111/57 | HR 78 | Temp 98.1°F | Resp 16 | Ht 60.0 in | Wt 140.0 lb

## 2024-09-21 DIAGNOSIS — R937 Abnormal findings on diagnostic imaging of other parts of musculoskeletal system: Secondary | ICD-10-CM | POA: Diagnosis present

## 2024-09-21 DIAGNOSIS — M545 Low back pain, unspecified: Secondary | ICD-10-CM | POA: Insufficient documentation

## 2024-09-21 DIAGNOSIS — M5459 Other low back pain: Secondary | ICD-10-CM | POA: Insufficient documentation

## 2024-09-21 DIAGNOSIS — G8929 Other chronic pain: Secondary | ICD-10-CM

## 2024-09-21 DIAGNOSIS — Z9682 Presence of neurostimulator: Secondary | ICD-10-CM | POA: Insufficient documentation

## 2024-09-21 DIAGNOSIS — M47816 Spondylosis without myelopathy or radiculopathy, lumbar region: Secondary | ICD-10-CM | POA: Diagnosis present

## 2024-09-21 DIAGNOSIS — M51369 Other intervertebral disc degeneration, lumbar region without mention of lumbar back pain or lower extremity pain: Secondary | ICD-10-CM | POA: Diagnosis present

## 2024-09-21 DIAGNOSIS — M47817 Spondylosis without myelopathy or radiculopathy, lumbosacral region: Secondary | ICD-10-CM

## 2024-09-21 MED ORDER — FENTANYL CITRATE (PF) 100 MCG/2ML IJ SOLN
25.0000 ug | INTRAMUSCULAR | Status: DC | PRN
  Administered 2024-09-21: 50 ug via INTRAVENOUS

## 2024-09-21 MED ORDER — PENTAFLUOROPROP-TETRAFLUOROETH EX AERO
INHALATION_SPRAY | Freq: Once | CUTANEOUS | Status: AC
  Administered 2024-09-21: 30 via TOPICAL

## 2024-09-21 MED ORDER — MIDAZOLAM HCL 5 MG/5ML IJ SOLN
INTRAMUSCULAR | Status: AC
  Filled 2024-09-21: qty 5

## 2024-09-21 MED ORDER — FENTANYL CITRATE (PF) 100 MCG/2ML IJ SOLN
INTRAMUSCULAR | Status: AC
  Filled 2024-09-21: qty 2

## 2024-09-21 MED ORDER — ROPIVACAINE HCL 2 MG/ML IJ SOLN
INTRAMUSCULAR | Status: AC
  Filled 2024-09-21: qty 20

## 2024-09-21 MED ORDER — LIDOCAINE HCL 2 % IJ SOLN
20.0000 mL | Freq: Once | INTRAMUSCULAR | Status: AC
  Administered 2024-09-21: 400 mg

## 2024-09-21 MED ORDER — LIDOCAINE HCL 2 % IJ SOLN
INTRAMUSCULAR | Status: AC
  Filled 2024-09-21: qty 20

## 2024-09-21 MED ORDER — TRIAMCINOLONE ACETONIDE 40 MG/ML IJ SUSP
80.0000 mg | Freq: Once | INTRAMUSCULAR | Status: AC
  Administered 2024-09-21: 80 mg

## 2024-09-21 MED ORDER — ROPIVACAINE HCL 2 MG/ML IJ SOLN
18.0000 mL | Freq: Once | INTRAMUSCULAR | Status: AC
  Administered 2024-09-21: 18 mL via PERINEURAL

## 2024-09-21 MED ORDER — TRIAMCINOLONE ACETONIDE 40 MG/ML IJ SUSP
INTRAMUSCULAR | Status: AC
  Filled 2024-09-21: qty 2

## 2024-09-21 MED ORDER — MIDAZOLAM HCL 5 MG/5ML IJ SOLN
0.5000 mg | Freq: Once | INTRAMUSCULAR | Status: AC
  Administered 2024-09-21: 2 mg via INTRAVENOUS

## 2024-09-21 NOTE — Progress Notes (Signed)
 PROVIDER NOTE: Interpretation of information contained herein should be left to medically-trained personnel. Specific patient instructions are provided elsewhere under Patient Instructions section of medical record. This document was created in part using STT-dictation technology, any transcriptional errors that may result from this process are unintentional.  Patient: Karen Edwards Type: Established DOB: 02/22/1966 MRN: [id number] PCP: Arloa Almarie NOVAK, FNP  Service: Procedure DOS: 09/21/2024 Setting: Ambulatory Location: Ambulatory outpatient facility Delivery: Face-to-face Provider: Eric DELENA Como, MD Specialty: Interventional Pain Management Specialty designation: 09 Location: Outpatient facility Ref. Prov.: Como Eric, MD       Interventional Therapy   Type: Lumbar Facet, Medial Branch Block(s)   R6L7  Laterality: Bilateral  Level: L2, L3, L4, L5, and S1 Medial Branch/Dorsal Rami Level(s). Injecting these levels blocks the L3-4, L4-5, and L5-S1 lumbar facet joints. Imaging: Fluoroscopic guidance Spinal ([id number]) Anesthesia: Local anesthesia (1-2% Lidocaine ) Anxiolysis: IV Versed  2.0 mg            Sedation: Minimal Sedation Fentanyl  1 mL (50 mcg) DOS: 09/21/2024 Performed by: Eric DELENA Como, MD  Primary Purpose: Diagnostic/Therapeutic Indications: Low back pain severe enough to impact quality of life or function. 1. Chronic low back pain (Bilateral) w/o sciatica   2. Degeneration of intervertebral disc of lumbar region, unspecified whether pain present   3. Lumbar facet hypertrophy (Bilateral)   4. Lumbar facet joint pain   5. Lumbar facet syndrome (Bilateral) (L>R)   6. Low back pain of over 3 months duration   7. Intermittent low back pain   8. Multifactorial low back pain   9. Spondylosis without myelopathy or radiculopathy, lumbosacral region   10. Neurostimulator device in situ   11. Abnormal MRI, lumbar spine (04/17/2021)    NAS-11 Pain  score:   Pre-procedure: 8 /10   Post-procedure: 0-No pain/10     Position / Prep / Materials:  Position: Prone  Prep solution: ChloraPrep (2% chlorhexidine  gluconate and 70% isopropyl alcohol) Area Prepped: Posterolateral Lumbosacral Spine (Wide prep: From the lower border of the scapula down to the end of the tailbone and from flank to flank.)  Materials:  Tray: Block Needle(s):  Type: Spinal  Gauge (G): 22  Length: 3.5-in Qty: 4     H&P (Pre-op Assessment):  Karen Edwards is a 58 y.o. (year old), female patient, seen today for interventional treatment. She  has a past surgical history that includes Cesarean section; Ankle surgery (Right, 03/02/2014); Hemorrhoid surgery (N/A, 06/16/2015); Cervical spine surgery (02/21/2016); Cesarean section with bilateral tubal ligation (1990); Colonoscopy (04/19/2015); Hysteroscopy with D & C (12/24/2013); Colonoscopy with propofol  (N/A, 05/18/2018); Esophagogastroduodenoscopy (egd) with propofol  (N/A, 05/18/2018); Spinal cord stimulator insertion (N/A, 06/18/2018); Tubal ligation; Lumbar spinal cord stimulator revision (N/A, 03/14/2020); Colonoscopy with propofol  (N/A, 06/25/2023); Colonoscopy with propofol  (N/A, 08/07/2023); and polypectomy (08/07/2023). Karen Edwards has a current medication list which includes the following prescription(s): acetaminophen , albuterol, alprazolam, atorvastatin , cyanocobalamin , fluoxetine , fluticasone, linaclotide , lisinopril, montelukast, naloxone , ondansetron , oxycodone , oxycodone , [START ON 10/03/2024] oxycodone , pregabalin , scopolamine, and zolpidem , and the following Facility-Administered Medications: fentanyl . Her primarily concern today is the Back Pain  Initial Vital Signs:  Pulse/HCG Rate: 78ECG Heart Rate: 69 (nsr) Temp: 98 F (36.7 C) Resp: 16 BP: 129/76 SpO2: 100 %  BMI: Estimated body mass index is 27.34 kg/m as calculated from the following:   Height as of this encounter: 5' (1.524 m).   Weight as of this  encounter: 140 lb (63.5 kg).  Risk Assessment: Allergies: Reviewed. She is allergic to gabapentin .  Allergy Precautions: None  required Coagulopathies: Reviewed. None identified.  Blood-thinner therapy: None at this time Active Infection(s): Reviewed. None identified. Karen Edwards is afebrile  Site Confirmation: Karen Edwards was asked to confirm the procedure and laterality before marking the site Procedure checklist: Completed Consent: Before the procedure and under the influence of no sedative(s), amnesic(s), or anxiolytics, the patient was informed of the treatment options, risks and possible complications. To fulfill our ethical and legal obligations, as recommended by the American Medical Association's Code of Ethics, I have informed the patient of my clinical impression; the nature and purpose of the treatment or procedure; the risks, benefits, and possible complications of the intervention; the alternatives, including doing nothing; the risk(s) and benefit(s) of the alternative treatment(s) or procedure(s); and the risk(s) and benefit(s) of doing nothing. The patient was provided information about the general risks and possible complications associated with the procedure. These may include, but are not limited to: failure to achieve desired goals, infection, bleeding, organ or nerve damage, allergic reactions, paralysis, and death. In addition, the patient was informed of those risks and complications associated to Spine-related procedures, such as failure to decrease pain; infection (i.e.: Meningitis, epidural or intraspinal abscess); bleeding (i.e.: epidural hematoma, subarachnoid hemorrhage, or any other type of intraspinal or peri-dural bleeding); organ or nerve damage (i.e.: Any type of peripheral nerve, nerve root, or spinal cord injury) with subsequent damage to sensory, motor, and/or autonomic systems, resulting in permanent pain, numbness, and/or weakness of one or several areas of the  body; allergic reactions; (i.e.: anaphylactic reaction); and/or death. Furthermore, the patient was informed of those risks and complications associated with the medications. These include, but are not limited to: allergic reactions (i.e.: anaphylactic or anaphylactoid reaction(s)); adrenal axis suppression; blood sugar elevation that in diabetics may result in ketoacidosis or comma; water  retention that in patients with history of congestive heart failure may result in shortness of breath, pulmonary edema, and decompensation with resultant heart failure; weight gain; swelling or edema; medication-induced neural toxicity; particulate matter embolism and blood vessel occlusion with resultant organ, and/or nervous system infarction; and/or aseptic necrosis of one or more joints. Finally, the patient was informed that Medicine is not an exact science; therefore, there is also the possibility of unforeseen or unpredictable risks and/or possible complications that may result in a catastrophic outcome. The patient indicated having understood very clearly. We have given the patient no guarantees and we have made no promises. Enough time was given to the patient to ask questions, all of which were answered to the patient's satisfaction. Karen Edwards has indicated that she wanted to continue with the procedure. Attestation: I, the ordering provider, attest that I have discussed with the patient the benefits, risks, side-effects, alternatives, likelihood of achieving goals, and potential problems during recovery for the procedure that I have provided informed consent. Date  Time: 09/21/2024  8:58 AM  Pre-Procedure Preparation:  Monitoring: As per clinic protocol. Respiration, ETCO2, SpO2, BP, heart rate and rhythm monitor placed and checked for adequate function Safety Precautions: Patient was assessed for positional comfort and pressure points before starting the procedure. Time-out: I initiated and conducted the  Time-out before starting the procedure, as per protocol. The patient was asked to participate by confirming the accuracy of the Time Out information. Verification of the correct person, site, and procedure were performed and confirmed by me, the nursing staff, and the patient. Time-out conducted as per Joint Commission's Universal Protocol (UP.01.01.01). Time: 0951 Start Time: 0951 hrs.  Description of Procedure:  Laterality: (see above) Targeted Levels: (see above)  Safety Precautions: Aspiration looking for blood return was conducted prior to all injections. At no point did we inject any substances, as a needle was being advanced. Before injecting, the patient was told to immediately notify me if she was experiencing any new onset of ringing in the ears, or metallic taste in the mouth. No attempts were made at seeking any paresthesias. Safe injection practices and needle disposal techniques used. Medications properly checked for expiration dates. SDV (single dose vial) medications used. After the completion of the procedure, all disposable equipment used was discarded in the proper designated medical waste containers. Local Anesthesia: Protocol guidelines were followed. The patient was positioned over the fluoroscopy table. The area was prepped in the usual manner. The time-out was completed. The target area was identified using fluoroscopy. A 12-in long, straight, sterile hemostat was used with fluoroscopic guidance to locate the targets for each level blocked. Once located, the skin was marked with an approved surgical skin marker. Once all sites were marked, the skin (epidermis, dermis, and hypodermis), as well as deeper tissues (fat, connective tissue and muscle) were infiltrated with a small amount of a short-acting local anesthetic, loaded on a 10cc syringe with a 25G, 1.5-in  Needle. An appropriate amount of time was allowed for local anesthetics to take effect before proceeding to  the next step. Local Anesthetic: Lidocaine  2.0% The unused portion of the local anesthetic was discarded in the proper designated containers. Technical description of process:  Medial Branch  Dorsal Rami Nerve Block (MBB):  Neuroanatomy note: Each lumbar facet joint receives dual innervation from medial branches arising from the posterior primary rami at the same level and one level above. The target for each lumbar medial branch is the junction of the ipsilateral superior articular and transverse process of the lower vertebral body. (i.e.: The L4-L5 facet joint is innervated by the L4 medial branch [located at L5] and the L3 medial branch [located at L4]. Blocking the L4 Medial Branch is therefore achieved by injecting at the junction of the ipsilateral superior articular and transverse process of the lower vertebral body [L5].).  Exception: The exception to the above rule is the L5-S1 facet joint which has triple innervation requiring the L4 medial branch, as well as the L5 and the S1 Dorsal Rami(s) to be blocked to fully denervate the joint.  Under fluoroscopic guidance, a needle was inserted until contact was made with os over the target area. After negative aspiration, 0.5 mL of the nerve block solution was injected without difficulty or complication. Paresthesia were avoided during injection. The needle(s) were removed intact and without complication.  Once the entire procedure was completed, the treated area was cleaned, making sure to leave some of the prepping solution back to take advantage of its long term bactericidal properties.         Illustration of the posterior view of the lumbar spine and the posterior neural structures. Laminae of L2 through S1 are labeled. DPRL5, dorsal primary ramus of L5; DPRS1, dorsal primary ramus of S1; DPR3, dorsal primary ramus of L3; FJ, facet (zygapophyseal) joint L3-L4; I, inferior articular process of L4; LB1, lateral branch of dorsal primary ramus of  L1; IAB, inferior articular branches from L3 medial branch (supplies L4-L5 facet joint); IBP, intermediate branch plexus; MB3, medial branch of dorsal primary ramus of L3; NR3, third lumbar nerve root; S, superior articular process of L5; SAB, superior articular branches from L4 (supplies L4-5 facet joint  also); TP3, transverse process of L3.   Facet Joint Innervation (* possible contribution)  L1-2 T12, L1 (L2*)  Medial Branch  L2-3 L1, L2 (L3*)                     L3-4 L2, L3 (L4*)                     L4-5 L3, L4 (L5*)                     L5-S1 L4, L5, S1                        Vitals:   09/21/24 1017 09/21/24 1020 09/21/24 1027 09/21/24 1031  BP:  118/68  (!) 111/57  Pulse:      Resp:  13  16  Temp:    98.1 F (36.7 C)  TempSrc:    Temporal  SpO2: 94% 96% 96% 97%  Weight:      Height:         End Time: 1003 hrs.  Imaging Guidance (Spinal):         Type of Imaging Technique: Fluoroscopy Guidance (Spinal) Indication(s): Fluoroscopy guidance for needle placement to enhance accuracy in procedures requiring precise needle localization for targeted delivery of medication in or near specific anatomical locations not easily accessible without such real-time imaging assistance. Exposure Time: Please see nurses notes. Contrast: None used. Fluoroscopic Guidance: I was personally present during the use of fluoroscopy. Tunnel Vision Technique used to obtain the best possible view of the target area. Parallax error corrected before commencing the procedure. Direction-depth-direction technique used to introduce the needle under continuous pulsed fluoroscopy. Once target was reached, antero-posterior, oblique, and lateral fluoroscopic projection used confirm needle placement in all planes. Images permanently stored in EMR. Interpretation: No contrast injected. I personally interpreted the imaging intraoperatively. Adequate needle placement confirmed in multiple planes. Permanent images  saved into the patient's record.  Post-operative Assessment:  Post-procedure Vital Signs:  Pulse/HCG Rate: 7864 Temp: 98.1 F (36.7 C) Resp: 16 BP: (!) 111/57 SpO2: 97 %  EBL: None  Complications: No immediate post-treatment complications observed by team, or reported by patient.  Note: The patient tolerated the entire procedure well. A repeat set of vitals were taken after the procedure and the patient was kept under observation following institutional policy, for this type of procedure. Post-procedural neurological assessment was performed, showing return to baseline, prior to discharge. The patient was provided with post-procedure discharge instructions, including a section on how to identify potential problems. Should any problems arise concerning this procedure, the patient was given instructions to immediately contact us , at any time, without hesitation. In any case, we plan to contact the patient by telephone for a follow-up status report regarding this interventional procedure.  Comments:  No additional relevant information.  Plan of Care (POC)  Orders:  Orders Placed This Encounter  Procedures   LUMBAR FACET(MEDIAL BRANCH NERVE BLOCK) MBNB    Scheduling Instructions:     Procedure: Lumbar facet block (AKA.: Lumbosacral medial branch nerve block)     Side: Bilateral     Level: L3-4, L4-5, and L5-S1 Facets (L2, L3, L4, L5, and S1 Medial Branch Nerves)     Sedation: Patient's choice.     Date: 09/21/2024    Where will this procedure be performed?:   [facility]   [facility] C-ARM 1-60 MIN NO REPORT  Intraoperative interpretation by procedural physician at [facility].    Standing Status:   Standing    Number of Occurrences:   1    Reason for exam::   Assistance in needle guidance and placement for procedures requiring needle placement in or near specific anatomical locations not easily accessible without such assistance.   Informed Consent Details:  Physician/Practitioner Attestation; Transcribe to consent form and obtain patient signature    Nursing Order: Transcribe to consent form and obtain patient signature. Note: Always confirm laterality of pain with Ms. Zackary, before procedure.    Physician/Practitioner attestation of informed consent for procedure/surgical case:   I, the physician/practitioner, attest that I have discussed with the patient the benefits, risks, side effects, alternatives, likelihood of achieving goals and potential problems during recovery for the procedure that I have provided informed consent.    Procedure:   Lumbar Facet Block  under fluoroscopic guidance    Physician/Practitioner performing the procedure:   Sacheen Arrasmith A. Tanya MD    Indication/Reason:   Low Back Pain, with our without leg pain, due to Facet Joint Arthralgia (Joint Pain) Spondylosis (Arthritis of the Spine), without myelopathy or radiculopathy (Nerve Damage).   Provide equipment / supplies at bedside    Procedure tray: Block Tray (Disposable  single use) Skin infiltration needle: Regular 1.5-in, 25-G, (x1) Block Needle type: Spinal Amount/quantity: 4 Size: Regular (3.5-inch) Gauge: 22G    Standing Status:   Standing    Number of Occurrences:   1    Specify:   Block Tray   Saline lock IV    Have LR [phone] mL available and administer at 125 mL/hr if patient becomes hypotensive.    Standing Status:   Standing    Number of Occurrences:   1     Opioid Analgesic(s): Oxycodone  IR 5 mg, 1 tab p.o. twice daily PRN (#20/90 days).  MME/day: 0 mg/day.    Medications ordered for procedure: Meds ordered this encounter  Medications   lidocaine  (XYLOCAINE ) 2 % (with pres) injection 400 mg   pentafluoroprop-tetrafluoroeth (GEBAUERS) aerosol   midazolam  (VERSED ) 5 MG/5ML injection 0.5-2 mg    Make sure Flumazenil is available in the pyxis when using this medication. If oversedation occurs, administer 0.2 mg IV over 15 sec. If after 45 sec no  response, administer 0.2 mg again over 1 min; may repeat at 1 min intervals; not to exceed 4 doses (1 mg)   fentaNYL  (SUBLIMAZE ) injection 25-50 mcg    Make sure Narcan  is available in the pyxis when using this medication. In the event of respiratory depression (RR< 8/min): Titrate NARCAN  (naloxone ) in increments of 0.1 to 0.2 mg IV at 2-3 minute intervals, until desired degree of reversal.   ropivacaine  (PF) 2 mg/mL (0.2%) (NAROPIN ) injection 18 mL   triamcinolone  acetonide (KENALOG -40) injection 80 mg   Medications administered: We administered lidocaine , pentafluoroprop-tetrafluoroeth, midazolam , fentaNYL , ropivacaine  (PF) 2 mg/mL (0.2%), and triamcinolone  acetonide.  See the medical record for exact dosing, route, and time of administration.    Interventional Therapies  Risk Factors  Considerations:   Mitral valve prolapse (MVP) GERD  urinary stress incontinence  GAD   Planned  Pending:   (05/26/2024) continue to titrate Lyrica  as tolerated.  Dose increased today from 25 mg at bedtime to 25 mg BID.   Under consideration:      Completed:   Diagnostic/therapeutic bilateral TBI x1 (04/22/2024) (100/100/40/40)  Therapeutic right cervical ESI x3 (03/11/2024) (100/100/100 x 7 days/100% for radicular  45% for  axial)  Diagnostic bilateral GONB x1 (08/19/2019) (100/100/100/,50)  Palliative right cervical facet MBB x3 (07/23/2018) (100/100/100/>50)  Palliative left cervical facet MBB x4 (04/01/2019) (90/90/0/0)  Palliative right cervical facet RFA x1 (09/17/2018) (100/100/85/85)  Palliative left cervical facet RFA x1 (05/04/2019) (100/100/50/50)  Therapeutic left L1-2 LESI x1 (05/10/2021) (100/100/100 x 2 days/0)  Therapeutic right L1-2 LESI x2 (02/10/2018) (100/100/70/50-75)  Therapeutic right L1 TFESI x1 (02/10/2018) (100/100/70/50-75)  Diagnostic/therapeutic left superior cluneal NB (L2, L3 dorsal rami) x1 (04/24/2021) (0/0/0/0)  Diagnostic/therapeutic left L1 TFESI x2 (11/15/2021)  (100/100/0)  Diagnostic/therapeutic left L3 TFESI x2 (11/15/2021) (100/100/0)  Bilateral lumbar spinal cord stimulator trial (done) (05/14/2018) (by me)  Bilateral lumbar spinal cord stimulator implant (09/28/2019) (by me)  Bilateral spinal cord stimulator revision (03/14/2020) (by me)  Therapeutic bilateral lumbar facet MBB R5L6 (12/02/2023) (100/100/80/80)  Diagnostic bilateral SI joint Blk x2 (09/18/2021) (100/100/100)  Palliative left lumbar facet RFA x3 (06/27/2022) (100/100/100/100)  Therapeutic right lumbar facet RFA x3 (05/21/2022) (100/100/75/75)    Therapeutic  Palliative (PRN) options:   Palliative right cervical facet RFA   Diagnostic bilateral SI joint Blk   Therapeutic bilateral lumbar facet RFA    Pharmacotherapy  Nonopioids transfer 08/23/2020: Lidocaine  5% ointment and Robaxin .       Follow-up plan:   Return in about 2 weeks (around 10/05/2024) for (Face2F), (PPE).     Recent Visits Date Type Provider Dept  09/08/24 Office Visit Tanya Glisson, MD [facility]  08/12/24 Procedure visit Tanya Glisson, MD [facility]  07/21/24 Office Visit Patel, Seema K, NP [facility]  06/23/24 Office Visit Tanya Glisson, MD [facility]  Showing recent visits within past 90 days and meeting all other requirements Today's Visits Date Type Provider Dept  09/21/24 Procedure visit Tanya Glisson, MD [facility]  Showing today's visits and meeting all other requirements Future Appointments Date Type Provider Dept  09/28/24 Appointment Patel, Seema K, NP [facility]  10/13/24 Appointment Tanya Glisson, MD [facility]  Showing future appointments within next 90 days and meeting all other requirements   Disposition: Discharge home  Discharge (Date  Time): 09/21/2024; 1035 hrs.   Primary Care Physician: Arloa Almarie NOVAK, FNP Location: [facility] Note by:  Glisson DELENA Tanya, MD (TTS technology used. I apologize for any typographical errors that were not detected and corrected.) Date: 09/21/2024; Time: 11:16 AM  Disclaimer:  Medicine is not an visual merchandiser. The only guarantee in medicine is that nothing is guaranteed. It is important to note that the decision to proceed with this intervention was based on the information collected from the patient. The Data and conclusions were drawn from the patient's questionnaire, the interview, and the physical examination. Because the information was provided in large part by the patient, it cannot be guaranteed that it has not been purposely or unconsciously manipulated. Every effort has been made to obtain as much relevant data as possible for this evaluation. It is important to note that the conclusions that lead to this procedure are derived in large part from the available data. Always take into account that the treatment will also be dependent on availability of resources and existing treatment guidelines, considered by other Pain Management Practitioners as being common knowledge and practice, at the time of the intervention. For Medico-Legal purposes, it is also important to point out that variation in procedural techniques and pharmacological choices are the acceptable norm. The indications, contraindications, technique, and results of the above procedure should  only be interpreted and judged by a Board-Certified Interventional Pain Specialist with extensive familiarity and expertise in the same exact procedure and technique.

## 2024-09-21 NOTE — Patient Instructions (Signed)
 ______________________________________________________________________    Post-Procedure Discharge Instructions  INSTRUCTIONS Apply ice:  Purpose: This will minimize any swelling and discomfort after procedure.  When: Day of procedure, as soon as you get home. How: Fill a plastic sandwich bag with crushed ice. Cover it with a small towel and apply to injection site. How long: (15 min on, 15 min off) Apply for 15 minutes then remove x 15 minutes.  Repeat sequence on day of procedure, until you go to bed. Apply heat:  Purpose: To treat any soreness and discomfort from the procedure. When: Starting the next day after the procedure. How: Apply heat to procedure site starting the day following the procedure. How long: May continue to repeat daily, until discomfort goes away. Food intake: Start with clear liquids (like water) and advance to regular food, as tolerated.  Physical activities: Keep activities to a minimum for the first 8 hours after the procedure. After that, then as tolerated. Driving: If you have received any sedation, be responsible and do not drive. You are not allowed to drive for 24 hours after having sedation. Blood thinner: (Applies only to those taking blood thinners) You may restart your blood thinner 6 hours after your procedure. Insulin: (Applies only to Diabetic patients taking insulin) As soon as you can eat, you may resume your normal dosing schedule. Infection prevention: Keep procedure site clean and dry. Shower daily and clean area with soap and water.  PAIN DIARY Post-procedure Pain Diary: Extremely important that this be done correctly and accurately. Recorded information will be used to determine the next step in treatment. For the purpose of accuracy, follow these rules: Evaluate only the area treated. Do not report or include pain from an untreated area. For the purpose of this evaluation, ignore all other areas of pain, except for the treated area. After your  procedure, avoid taking a long nap and attempting to complete the pain diary after you wake up. Instead, set your alarm clock to go off every hour, on the hour, for the initial 8 hours after the procedure. Document the duration of the numbing medicine, and the relief you are getting from it. Do not go to sleep and attempt to complete it later. It will not be accurate. If you received sedation, it is likely that you were given a medication that may cause amnesia. Because of this, completing the diary at a later time may cause the information to be inaccurate. This information is needed to plan your care. Follow-up appointment: Keep your post-procedure follow-up evaluation appointment after the procedure (usually 2 weeks for most procedures, 6 weeks for radiofrequencies). DO NOT FORGET to bring you pain diary with you.   EXPECT... (What should I expect to see with my procedure?) From numbing medicine (AKA: Local Anesthetics): Numbness or decrease in pain. You may also experience some weakness, which if present, could last for the duration of the local anesthetic. Onset: Full effect within 15 minutes of injected. Duration: It will depend on the type of local anesthetic used. On the average, 1 to 8 hours.  From steroids (Applies only if steroids were used): Decrease in swelling or inflammation. Once inflammation is improved, relief of the pain will follow. Onset of benefits: Depends on the amount of swelling present. The more swelling, the longer it will take for the benefits to be seen. In some cases, up to 10 days. Duration: Steroids will stay in the system x 2 weeks. Duration of benefits will depend on multiple posibilities including persistent irritating  factors. Side-effects: If present, they may typically last 2 weeks (the duration of the steroids). Frequent: Cramps (if they occur, drink Gatorade and take over-the-counter Magnesium 450-500 mg once to twice a day); water retention with temporary weight  gain; increases in blood sugar; decreased immune system response; increased appetite. Occasional: Facial flushing (red, warm cheeks); mood swings; menstrual changes. Uncommon: Long-term decrease or suppression of natural hormones; bone thinning. (These are more common with higher doses or more frequent use. This is why we prefer that our patients avoid having any injection therapies in other practices.)  Very Rare: Severe mood changes; psychosis; aseptic necrosis. From procedure: Some discomfort is to be expected once the numbing medicine wears off. This should be minimal if ice and heat are applied as instructed.  CALL IF... (When should I call?) You experience numbness and weakness that gets worse with time, as opposed to wearing off. New onset bowel or bladder incontinence. (Applies only to procedures done in the spine)  Emergency Numbers: Durning business hours (Monday - Thursday, 8:00 AM - 4:00 PM) (Friday, 9:00 AM - 12:00 Noon): [phone] After hours: [phone] NOTE: If you are having a problem and are unable connect with, or to talk to a provider, then go to your nearest urgent care or emergency department. If the problem is serious and urgent, please call 911. ______________________________________________________________________     ______________________________________________________________________    Steroid injections  Common steroids for injections Triamcinolone : Used by many sports medicine physicians for large joint and bursal injections, often combined with a local anesthetic like lidocaine . A study focusing on coccydynia (tailbone pain) found triamcinolone  was more effective than betamethasone, suggesting it may also be preferable for other localized inflammation conditions. Methylprednisolone: A common alternative to triamcinolone  that is also a strong anti-inflammatory. It is available in different formulations, with the acetate suspension being the long-acting  option for intra-articular injections. Dexamethasone : This is a non-particulate steroid, meaning it has a lower risk of tissue damage compared to particulate steroids like triamcinolone  and methylprednisolone. While less common for this specific use, it is an option for targeted injections.   Considerations for physicians Particulate vs. non-particulate steroids: Triamcinolone  and methylprednisolone are particulate, meaning they can clump together. Dexamethasone  is non-particulate. Particulate steroids are often preferred for their longer-lasting effects but carry a theoretical higher risk for certain injections (though this is less of a concern in the costochondral joints). Combined injectate: Corticosteroids are typically mixed with a local anesthetic like lidocaine  to provide both immediate pain relief (from the anesthetic) and longer-term inflammation reduction (from the steroid). Imaging guidance: To ensure accurate placement of the needle and medication, physicians may use ultrasound or fluoroscopic guidance for the injection, especially in complex or refractory cases.   Patient guidance Before undergoing a steroid injection, discuss the options with your physician. They will determine the best steroid, dosage, and procedure for your specific case based on factors like: Severity of your condition History of response to other treatments Your overall health status Experience and preference of the physician  Last  Updated: 06/22/2024 ______________________________________________________________________

## 2024-09-21 NOTE — Progress Notes (Signed)
 Safety precautions to be maintained throughout the outpatient stay will include: orient to surroundings, keep bed in low position, maintain call bell within reach at all times, provide assistance with transfer out of bed and ambulation.

## 2024-09-22 ENCOUNTER — Telehealth: Payer: Self-pay | Admitting: *Deleted

## 2024-09-22 NOTE — Telephone Encounter (Signed)
 Post procedure call:   no  questions or concerns.

## 2024-09-28 ENCOUNTER — Encounter: Payer: Self-pay | Admitting: Nurse Practitioner

## 2024-09-28 ENCOUNTER — Ambulatory Visit: Attending: Nurse Practitioner | Admitting: Nurse Practitioner

## 2024-09-28 VITALS — BP 123/86 | HR 82 | Temp 97.5°F | Resp 18 | Ht 60.0 in | Wt 140.0 lb

## 2024-09-28 DIAGNOSIS — M5481 Occipital neuralgia: Secondary | ICD-10-CM | POA: Diagnosis present

## 2024-09-28 DIAGNOSIS — G894 Chronic pain syndrome: Secondary | ICD-10-CM | POA: Insufficient documentation

## 2024-09-28 DIAGNOSIS — G8929 Other chronic pain: Secondary | ICD-10-CM | POA: Insufficient documentation

## 2024-09-28 DIAGNOSIS — M25551 Pain in right hip: Secondary | ICD-10-CM | POA: Insufficient documentation

## 2024-09-28 DIAGNOSIS — Z79899 Other long term (current) drug therapy: Secondary | ICD-10-CM | POA: Diagnosis present

## 2024-09-28 DIAGNOSIS — M5441 Lumbago with sciatica, right side: Secondary | ICD-10-CM | POA: Diagnosis present

## 2024-09-28 DIAGNOSIS — M47816 Spondylosis without myelopathy or radiculopathy, lumbar region: Secondary | ICD-10-CM | POA: Diagnosis present

## 2024-09-28 DIAGNOSIS — M79605 Pain in left leg: Secondary | ICD-10-CM | POA: Insufficient documentation

## 2024-09-28 DIAGNOSIS — M79604 Pain in right leg: Secondary | ICD-10-CM | POA: Diagnosis present

## 2024-09-28 DIAGNOSIS — Z79891 Long term (current) use of opiate analgesic: Secondary | ICD-10-CM | POA: Diagnosis present

## 2024-09-28 DIAGNOSIS — M25552 Pain in left hip: Secondary | ICD-10-CM | POA: Insufficient documentation

## 2024-09-28 DIAGNOSIS — M542 Cervicalgia: Secondary | ICD-10-CM | POA: Insufficient documentation

## 2024-09-28 DIAGNOSIS — M5442 Lumbago with sciatica, left side: Secondary | ICD-10-CM | POA: Diagnosis present

## 2024-09-28 MED ORDER — OXYCODONE HCL 5 MG PO TABS: 5.0000 mg | ORAL_TABLET | Freq: Every day | ORAL | 0 refills | Status: AC

## 2024-09-28 NOTE — Progress Notes (Signed)
 PROVIDER NOTE: Interpretation of information contained herein should be left to medically-trained personnel. Specific patient instructions are provided elsewhere under Patient Instructions section of medical record. This document was created in part using AI and STT-dictation technology, any transcriptional errors that may result from this process are unintentional.  Patient: Karen Edwards  Service: E/M   PCP: Arloa Almarie NOVAK, FNP  DOB: 1966-10-24  DOS: 09/28/2024  Provider: Emmy MARLA Blanch, NP  MRN: [id number]  Delivery: Face-to-face  Specialty: Interventional Pain Management  Type: Established Patient  Setting: Ambulatory outpatient facility  Specialty designation: 09  Referring Prov.: Arloa Almarie NOVAK, FNP  Location: Outpatient office facility       History of present illness (HPI) Karen Edwards, a 58 y.o. year old female, is here today because of her back pain. Ms. Osmun primary complain today is Back Pain  Pertinent problems: Ms. 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  Pain  Assessment: Severity of Chronic pain is reported as a 5 /10. Location: Back Lower/Down bilateral legs. Onset: More than a month ago. Quality: Aching, Burning, Constant, Discomfort, Stabbing. Timing: Constant. Modifying factor(s): Change positions, Pain medicaiton. Vitals:  height is 5' (1.524 m) and weight is 140 lb (63.5 kg). Her temporal temperature is 97.5 F (36.4 C) (abnormal). Her blood pressure is 123/86 and her pulse is 82. Her respiration is 18 and oxygen saturation is 100%.  BMI: Estimated body mass index is 27.34 kg/m as calculated from the following:   Height as of this encounter: 5' (1.524 m).   Weight as of this encounter: 140 lb (63.5 kg).  Last encounter: 07/21/2024. Last procedure: 09/21/2024  Reason for encounter: both, medication management and post-procedure evaluation and assessment.  The patient indicates doing well with current medication regimen.  No adverse reaction or side effects reported to medication.  Karen Edwards underwent a diagnostic/therapeutic  Lumbar Facet, Medial Branch Block(s)   R6L7 on September 21, 2024.  She reports initially 100% pain relief during local anesthetic phase, followed by sustained 90% ongoing pain relief and functional improvement since the procedure.  Discussed the use of AI scribe software for clinical note transcription with the patient, who gave verbal consent to proceed.  History of Present Illness   Karen Edwards is a 58 year old female who presents with lower back pain following a lumbar facet block. She experiences pain across her lower back, specifically at the site where the lumbar facet block was administered. She takes oxycodone  daily for pain management and reports no side effects or adverse reactions from the medication. She has been living in [place] for six years following the passing of her husband and prefers to continue seeing her current doctor despite the distance.   Procedure Type: Lumbar Facet, Medial Branch  Block(s)   R6L7  Laterality: Bilateral  Level:  L2, L3, L4, L5, and S1 Medial Branch/Dorsal Rami Level(s). Injecting these levels blocks the L3-4, L4-5, and L5-S1 lumbar facet joints. Imaging: Fluoroscopic guidance Spinal ([id number]) Anesthesia: Local anesthesia (1-2% Lidocaine ) Anxiolysis: IV Versed  2.0 mg            Sedation: Minimal Sedation Fentanyl  1 mL (50 mcg) DOS: 09/21/2024 Performed by: Eric DELENA Como, MD   Primary Purpose: Diagnostic/Therapeutic Indications: Low back pain severe enough to impact quality of life or function. 1. Chronic low back pain (Bilateral) w/o sciatica   2. Degeneration of intervertebral disc of lumbar region, unspecified whether pain present   3. Lumbar facet hypertrophy (Bilateral)   4. Lumbar facet joint pain   5. Lumbar facet syndrome (Bilateral) (L>R)   6. Low back pain of over 3 months duration   7. Intermittent low back pain   8. Multifactorial low back pain   9. Spondylosis without myelopathy or radiculopathy, lumbosacral region   10. Neurostimulator device in situ   11. Abnormal MRI, lumbar spine (04/17/2021)     NAS-11 Pain score:        Pre-procedure: 8 /10        Post-procedure: 0-No pain/10   Post-Procedure Evaluation    Effectiveness:  Initial hour after procedure: 100 % . Subsequent 4-6 hours post-procedure: 100 % . Analgesia past initial 6 hours: 90 % . Ongoing improvement:  Analgesic:  90% Function: Karen Edwards reports improvement in function ROM: Karen Edwards reports improvement in ROM Interpretation: Karen Edwards underwent a diagnostic/therapeutic  Lumbar Facet, Medial Branch Block(s)   R6L7 on September 21, 2024.  She reports initially 100% pain relief during local anesthetic phase, followed by sustained 90% ongoing pain relief and functional improvement since the procedure.  Pharmacotherapy Assessment   Analgesic: Oxycodone  (Oxy IR/Roxicodone ) 5 mg immediate release tablet daily for pain. MME=7.50 Monitoring:  PMP:  PDMP reviewed during this encounter.       Pharmacotherapy: No side-effects or adverse reactions reported. Compliance: No problems identified. Effectiveness: Clinically acceptable.  Karen Edwards, [place]  09/28/2024  1:14 PM  Sign when Signing Visit Nursing Pain Medication Assessment:  Safety precautions to be maintained throughout the outpatient stay will include: orient to surroundings, keep bed in low position, maintain call bell within reach at all times, provide assistance with transfer out of bed and ambulation.  Medication Inspection Compliance: Pill count conducted under aseptic conditions, in front of the patient. Neither the pills nor the bottle was removed from the patient's sight at any time. Once count was completed pills were immediately returned to the patient in their original bottle.  Medication: Oxycodone  IR Pill/Patch Count: 17 of 30 pills/patches remain Pill/Patch Appearance: Markings consistent with prescribed medication Bottle Appearance: Standard pharmacy container. Clearly labeled. Filled Date: 75 / 07 / 2025 Last Medication intake:  Yesterday    UDS:  Summary  Date Value Ref Range Status  07/21/2024 FINAL  Final    Comment:    ==================================================================== [employer] [facility] 13 (MW) ==================================================================== Test                             Result       Flag       Units  Drug Present and Declared for Prescription Verification   Alprazolam                     117          EXPECTED  ng/mg creat   Alpha-hydroxyalprazolam        138          EXPECTED   ng/mg creat    Source of alprazolam is a scheduled prescription medication. Alpha-    hydroxyalprazolam is an expected metabolite of alprazolam.    Noroxycodone                   503          EXPECTED   ng/mg creat    Noroxycodone is an expected metabolite of oxycodone . Sources of    oxycodone  include scheduled prescription  medications.  Drug Absent but Declared for Prescription Verification   Oxycodone                       Not Detected UNEXPECTED ng/mg creat    Oxycodone  is almost always present in patients taking this drug    consistently.  Absence of oxycodone  could be due to lapse of time    since the last dose or unusual pharmacokinetics (rapid metabolism).  ==================================================================== Test                      Result    Flag   Units      Ref Range   Creatinine              29               mg/dL      >=79 ==================================================================== Declared Medications:  The flagging and interpretation on this report are based on the  following declared medications.  Unexpected results may arise from  inaccuracies in the declared medications.   **Note: The testing scope of this panel includes these medications:   Alprazolam (Xanax)  Oxycodone    **Note: The testing scope of this panel does not include the  following reported medications:   Acetaminophen  (Tylenol )  Albuterol (Ventolin HFA)  Atorvastatin  (Lipitor)  Dextromethorphan  Fluoxetine  (Prozac )  Fluticasone (Flonase)  Linaclotide  (Linzess )  Lisinopril (Zestril)  Montelukast (Singulair)  Naloxone  (Narcan )  Ondansetron  (Zofran )  Pregabalin  (Lyrica )  Promethazine   Scopolamine  Vitamin B12  Zolpidem  (Ambien ) ==================================================================== For clinical consultation, please call [phone]. ====================================================================     No results found for: CBDTHCR No results found for: D8THCCBX No results found for: D9THCCBX  ROS  Constitutional: Denies any fever or chills Gastrointestinal: No reported hemesis, hematochezia, vomiting, or acute GI distress Musculoskeletal: Low back pain Neurological: No reported episodes of acute onset apraxia, aphasia, dysarthria, agnosia, amnesia,  paralysis, loss of coordination, or loss of consciousness  Medication Review  ALPRAZolam, FLUoxetine , acetaminophen , albuterol, atorvastatin , cyanocobalamin , fluticasone, linaclotide , lisinopril, montelukast, naloxone , ondansetron , oxyCODONE , pregabalin , scopolamine, and zolpidem   History Review  Allergy: Ms. Henrickson is allergic to gabapentin . Drug: Ms. Howser  reports no history of drug use. Alcohol:  reports current alcohol use of about 1.0 standard drink of alcohol per week. Tobacco:  reports that she has been smoking cigarettes. She has a 18.5 pack-year smoking history. She has never used smokeless tobacco. Social: Ms. Hipp  reports that she has been smoking cigarettes. She has a 18.5 pack-year smoking history. She has never used smokeless tobacco. She reports current alcohol use of about 1.0 standard drink of alcohol per week. She reports that she does not use drugs. Medical:  has a past medical history of Anxiety, Chronic back pain, Degenerative joint disease, Depression, Headache, Hypotension, IBS (irritable bowel syndrome) (05/2015), MVP (mitral valve prolapse), and Nausea  and vomiting. Surgical: Ms. Langsam  has a past surgical history that includes Cesarean section; Ankle surgery (Right, 03/02/2014); Hemorrhoid surgery (N/A, 06/16/2015); Cervical spine surgery (02/21/2016); Cesarean section with bilateral tubal ligation (1990); Colonoscopy (04/19/2015); Hysteroscopy with D & C (12/24/2013); Colonoscopy with propofol  (N/A, 05/18/2018); Esophagogastroduodenoscopy (egd) with propofol  (N/A, 05/18/2018); Spinal cord stimulator insertion (N/A, 06/18/2018); Tubal ligation; Lumbar spinal cord stimulator revision (N/A, 03/14/2020); Colonoscopy with propofol  (N/A, 06/25/2023); Colonoscopy with propofol  (N/A, 08/07/2023); and polypectomy (08/07/2023). Family: family history includes Arthritis in her mother; Congestive Heart Failure in her maternal grandfather; Depression in her mother; Diabetes in her  maternal aunt; Diabetes Mellitus I in her maternal grandmother; Gout in her maternal aunt; Hearing loss in her father; Heart attack in her maternal grandfather; Hyperlipidemia in her father and mother; Hypertension in her father and mother; Hypothyroidism in her mother; Kidney disease in her mother; Prostate cancer in her paternal grandfather; Stroke in her maternal grandmother; Thyroid  disease in her maternal aunt; Vision loss in her mother.  Laboratory Chemistry Profile   Renal Lab Results  Component Value Date   BUN 12 02/01/2021   CREATININE 0.85 02/01/2021   BCR 8 (L) 09/08/2017   GFR 77.34 02/01/2021   GFRAA 93 09/08/2017   GFRNONAA 81 09/08/2017    Hepatic Lab Results  Component Value Date   AST 15 02/01/2021   ALT 11 02/01/2021   ALBUMIN 4.4 02/01/2021   ALKPHOS 59 02/01/2021    Electrolytes Lab Results  Component Value Date   NA 139 02/01/2021   K 4.2 02/01/2021   CL 105 02/01/2021   CALCIUM  9.3 02/01/2021   MG 2.1 09/08/2017    Bone Lab Results  Component Value Date   VD25OH 43.74 01/10/2016   25OHVITD1 51 09/08/2017   25OHVITD2 <1.0 09/08/2017   25OHVITD3 51 09/08/2017    Inflammation (CRP: Acute Phase) (ESR: Chronic Phase) Lab Results  Component Value Date   CRP 1.4 09/08/2017   ESRSEDRATE 11 09/08/2017         Note: Above Lab results reviewed.  Recent Imaging Review  DG PAIN CLINIC C-ARM 1-60 MIN NO REPORT Fluoro was used, but no Radiologist interpretation will be provided.  Please refer to NOTES tab for provider progress note. Note: Reviewed        Physical Exam  Vitals: BP 123/86 (BP Location: Right Arm, Patient Position: Sitting, Cuff Size: Normal)   Pulse 82   Temp (!) 97.5 F (36.4 C) (Temporal)   Resp 18   Ht 5' (1.524 m)   Wt 140 lb (63.5 kg)   LMP 05/05/2017 (Exact Date)   SpO2 100%   BMI 27.34 kg/m  BMI: Estimated body mass index is 27.34 kg/m as calculated from the following:   Height as of this encounter: 5' (1.524 m).    Weight as of this encounter: 140 lb (63.5 kg). Ideal: Ideal body weight: 45.5 kg (100 lb 4.9 oz) Adjusted ideal body weight: 52.7 kg (116 lb 3 oz) General appearance: Well nourished, well developed, and well hydrated. In no apparent acute distress Mental status: Alert, oriented x 3 (person, place, & time)       Respiratory: No evidence of acute respiratory distress Eyes: PERLA  Musculoskeletal: +LBP Assessment   Diagnosis Status  1. 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 Chronic use of opiate for therapeutic purpose   3. Chronic pain syndrome   4. Chronic hip pain (3ry area of Pain) (Bilateral) (R>L)   5. Chronic low back pain (1ry area  of Pain) (Bilateral) (R>L) w/ sciatica (Bilateral)   6. Chronic lower extremity pain (2ry area of Pain) (Bilateral) (R>L)   7. Chronic neck pain (4th area of Pain) (Bilateral) (R>L)   8. Chronic occipital neuralgia (5th area of Pain) (Bilateral) (R>L)   9. Pharmacologic therapy    Controlled Controlled Controlled   Updated Problems: No problems updated.  Plan of Care  Problem-specific:  Assessment and Plan    Chronic low back pain with lumbar facet arthropathy managed with lumbar facet block. No adverse reactions reported. - Continue current pain management regimen.  The patient recently had lumbar facet block which provided significant pain relief and functional improvement.  She reports 90% pain relief with recent lumbar facet block.   Chronic pain syndrome Managed with long-term opioid therapy. No side effects reported from oxycodone  use. - Continue oxycodone  therapy.  Patient's pain is controlled with oxycodone , will continue on current medication regimen.  Prescribing drug monitoring (PDMP) reviewed, findings consistent with the use of prescribed medication and no evidence of narcotic misuse or abuse.  No side effects or adverse reaction reported to medication.  Schedule follow-up in 90 days for medication management.     Ms. MAHIMA HOTTLE has a current medication list which includes the following long-term medication(s): albuterol, fluoxetine , fluticasone, linaclotide , lisinopril, montelukast, pregabalin , [START ON 11/02/2024] oxycodone , [START ON 12/02/2024] oxycodone , and [START ON 01/01/2025] oxycodone .  Pharmacotherapy (Medications Ordered): Meds ordered this encounter  Medications   oxyCODONE  (OXY IR/ROXICODONE ) 5 MG immediate release tablet    Sig: Take 1 tablet (5 mg total) by mouth daily. Must last 30 days.    Dispense:  30 tablet    Refill:  0    DO NOT: delete (not duplicate); no partial-fill (will deny script to complete), no refill request (F/U required). DISPENSE: 1 day early if closed on fill date. WARN: No CNS-depressants within 8 hrs of med.   oxyCODONE  (OXY IR/ROXICODONE ) 5 MG immediate release tablet    Sig: Take 1 tablet (5 mg total) by mouth daily. Must last 30 days.    Dispense:  30 tablet    Refill:  0    DO NOT: delete (not duplicate); no partial-fill (will deny script to complete), no refill request (F/U required). DISPENSE: 1 day early if closed on fill date. WARN: No CNS-depressants within 8 hrs of med.   oxyCODONE  (OXY IR/ROXICODONE ) 5 MG immediate release tablet    Sig: Take 1 tablet (5 mg total) by mouth daily. Must last 30 days.    Dispense:  30 tablet    Refill:  0    DO NOT: delete (not duplicate); no partial-fill (will deny script to complete), no refill request (F/U required). DISPENSE: 1 day early if closed on fill date. WARN: No CNS-depressants within 8 hrs of med.   Orders:  No orders of the defined types were placed in this encounter.    Plan Interventional Therapies  Risk Factors  Considerations:   Mitral valve prolapse (MVP) GERD  urinary stress incontinence  GAD    Planned  Pending:   (05/26/2024) continue to titrate Lyrica  as tolerated.  Dose increased today from 25 mg at bedtime to 25 mg BID.    Under consideration:        Completed:   Diagnostic/therapeutic  bilateral TBI x1 (04/22/2024) (100/100/40/40)  Therapeutic right cervical ESI x3 (03/11/2024) (100/100/100 x 7 days/100% for radicular  45% for axial)  Diagnostic bilateral GONB x1 (08/19/2019) (100/100/100/,50)  Palliative right cervical facet MBB  x3 (07/23/2018) (100/100/100/>50)  Palliative left cervical facet MBB x4 (04/01/2019) (90/90/0/0)  Palliative right cervical facet RFA x1 (09/17/2018) (100/100/85/85)  Palliative left cervical facet RFA x1 (05/04/2019) (100/100/50/50)  Therapeutic left L1-2 LESI x1 (05/10/2021) (100/100/100 x 2 days/0)  Therapeutic right L1-2 LESI x2 (02/10/2018) (100/100/70/50-75)  Therapeutic right L1 TFESI x1 (02/10/2018) (100/100/70/50-75)  Diagnostic/therapeutic left superior cluneal NB (L2, L3 dorsal rami) x1 (04/24/2021) (0/0/0/0)  Diagnostic/therapeutic left L1 TFESI x2 (11/15/2021) (100/100/0)  Diagnostic/therapeutic left L3 TFESI x2 (11/15/2021) (100/100/0)  Bilateral lumbar spinal cord stimulator trial (done) (05/14/2018) (by me)  Bilateral lumbar spinal cord stimulator implant (09/28/2019) (by me)  Bilateral spinal cord stimulator revision (03/14/2020) (by me)  Therapeutic bilateral lumbar facet MBB R6L7 (09/21/2024) (100/100/90/90)  Diagnostic bilateral SI joint Blk x2 (09/18/2021) (100/100/100)  Palliative left lumbar facet RFA x3 (06/27/2022) (100/100/100/100)  Therapeutic right lumbar facet RFA x3 (05/21/2022) (100/100/75/75)     Therapeutic  Palliative (PRN) options:   Palliative right cervical facet RFA   Diagnostic bilateral SI joint Blk   Therapeutic bilateral lumbar facet RFA     Pharmacotherapy  Nonopioids transfer 08/23/2020: Lidocaine  5% ointment and Robaxin .     Return in about 3 months (around 12/27/2024) for (F2F), (MM), Emmy Blanch NP.    Recent Visits Date Type Provider Dept  09/21/24 Procedure visit Tanya Glisson, MD [facility]  09/08/24 Office Visit Tanya Glisson, MD [facility]  08/12/24 Procedure visit  Tanya Glisson, MD [facility]  07/21/24 Office Visit Karilynn Carranza K, NP [facility]  Showing recent visits within past 90 days and meeting all other requirements Today's Visits Date Type Provider Dept  09/28/24 Office Visit Loxley Cibrian K, NP [facility]  Showing today's visits and meeting all other requirements Future Appointments Date Type Provider Dept  12/23/24 Appointment Francisco Ostrovsky K, NP [facility]  Showing future appointments within next 90 days and meeting all other requirements  I discussed the assessment and treatment plan with the patient. The patient was provided an opportunity to ask questions and all were answered. The patient agreed with the plan and demonstrated an understanding of the instructions.  Patient advised to call back or seek an in-person evaluation if the symptoms or condition worsens.  I personally spent a total of 30 minutes in the care of the patient today including preparing to see the patient, getting/reviewing separately obtained history, performing a medically appropriate exam/evaluation, counseling and educating, placing orders, referring and communicating with other health care professionals, documenting clinical information in the EHR, independently interpreting results, communicating results, and coordinating care.   Note by: Chyrl Elwell K Delonte Musich, NP (TTS and AI technology used. I apologize for any typographical errors that were not detected and corrected.) Date: 09/28/2024; Time: 2:10 PM

## 2024-09-28 NOTE — Progress Notes (Signed)
 Nursing Pain Medication Assessment:  Safety precautions to be maintained throughout the outpatient stay will include: orient to surroundings, keep bed in low position, maintain call bell within reach at all times, provide assistance with transfer out of bed and ambulation.  Medication Inspection Compliance: Pill count conducted under aseptic conditions, in front of the patient. Neither the pills nor the bottle was removed from the patient's sight at any time. Once count was completed pills were immediately returned to the patient in their original bottle.  Medication: Oxycodone  IR Pill/Patch Count: 17 of 30 pills/patches remain Pill/Patch Appearance: Markings consistent with prescribed medication Bottle Appearance: Standard pharmacy container. Clearly labeled. Filled Date: 40 / 07 / 2025 Last Medication intake:  Yesterday

## 2024-10-12 ENCOUNTER — Other Ambulatory Visit: Payer: Self-pay | Admitting: Nurse Practitioner

## 2024-10-12 DIAGNOSIS — M792 Neuralgia and neuritis, unspecified: Secondary | ICD-10-CM

## 2024-10-12 DIAGNOSIS — R52 Pain, unspecified: Secondary | ICD-10-CM

## 2024-10-12 DIAGNOSIS — G8929 Other chronic pain: Secondary | ICD-10-CM

## 2024-10-13 ENCOUNTER — Ambulatory Visit: Admitting: Pain Medicine

## 2024-10-26 ENCOUNTER — Other Ambulatory Visit: Payer: Self-pay | Admitting: Nurse Practitioner

## 2024-10-26 ENCOUNTER — Telehealth: Payer: Self-pay

## 2024-10-26 ENCOUNTER — Telehealth: Payer: Self-pay | Admitting: *Deleted

## 2024-10-26 ENCOUNTER — Other Ambulatory Visit: Payer: Self-pay | Admitting: *Deleted

## 2024-10-26 DIAGNOSIS — G8929 Other chronic pain: Secondary | ICD-10-CM

## 2024-10-26 DIAGNOSIS — R52 Pain, unspecified: Secondary | ICD-10-CM

## 2024-10-26 DIAGNOSIS — M792 Neuralgia and neuritis, unspecified: Secondary | ICD-10-CM

## 2024-10-26 MED ORDER — PREGABALIN 50 MG PO CAPS: 50.0000 mg | ORAL_CAPSULE | Freq: Two times a day (BID) | ORAL | 0 refills | Status: AC

## 2024-10-26 NOTE — Telephone Encounter (Signed)
 Patient called and said she got a message saying her pregabalin   was denied. Can someone call her about this

## 2024-11-18 NOTE — Patient Instructions (Signed)
 Thyroid Nodule  A thyroid nodule is an isolated growth of thyroid cells that forms a lump in the thyroid gland. The thyroid gland is a butterfly-shaped gland found in the lower front of the neck. It sends chemical messengers (hormones) through the blood to all parts of the body. These hormones are important in regulating body temperature and helping the body use energy. Thyroid nodules are common. Most are not cancerous (are benign). You may have one nodule or several nodules. There are different types of thyroid nodules. They include nodules that: Grow and fill with fluid (thyroid cysts). Produce too much thyroid hormone (hot nodules or hyperthyroid). Produce no thyroid hormone (cold nodules or hypothyroid). Form from cancer cells (thyroid cancers). What are the causes? In most cases, the cause of thyroid nodules is not known. What increases the risk? The following factors may make you more likely to develop thyroid nodules: Age. Thyroid nodules are more common in people who are older than 45 years. Female gender. A family history that includes: Thyroid nodules. Pheochromocytoma. Thyroid carcinoma. Hyperparathyroidism. Certain thyroid diseases, such as Hashimoto's thyroiditis. Lack of iodine in your diet. A history of head and neck radiation, such as from cancer treatments. Type 2 diabetes. What are the signs or symptoms? In many cases, there are no symptoms. If you have symptoms, they may include: A lump in your lower neck. Feeling pressure, fullness, or a tickle in your throat. Pain in your neck, jaw, or ear. Having trouble swallowing or breathing. Hot nodules may cause: Weight loss. Warm, flushed skin. Feeling hot. Feeling nervous. A rapid or irregular heartbeat. Cold nodules may cause: Weight gain. Dry skin. Hair loss, brittle hair, or both. Feeling cold. Fatigue. Thyroid cancer nodules may cause: Hard nodules that can be felt along the thyroid  gland. Hoarseness. Lumps in the tissue (lymph nodes) near your thyroid gland. How is this diagnosed? A thyroid nodule may be felt by your health care provider during a physical exam. This condition may also be diagnosed based on your symptoms. You may also have tests, including: Blood tests to check how well your thyroid is working. An ultrasound. This may be done to confirm the diagnosis. A biopsy. This involves taking a sample from the nodule and looking at it under a microscope. A thyroid scan. This test creates an image of the thyroid gland using a radioactive tracer. Imaging tests such as an MRI or CT scan. These may be done if: A nodule is large. A nodule is blocking your airway. Cancer is suspected. How is this treated? Treatment depends on the cause and size of your nodule or nodules. If a nodule is benign, treatment may not be necessary. Your health care provider may monitor the nodule to see if it goes away without treatment. If a nodule continues to grow, is cancerous, or does not go away, treatment may be needed. Treatment may include: Having a cystic nodule drained with a needle. Ablation therapy. In this treatment, alcohol is injected into the area of the nodule to destroy the cells. Ablation with heat may also be used. This is called thermal ablation. Radioactive iodine. In this treatment, radioactive iodine is given as a pill or liquid that you drink. This substance causes the thyroid nodule to shrink. Surgery to remove the nodule or nodules. Part or all of your thyroid gland may also need to be removed. Medicines to treat hyperthyroidism. Follow these instructions at home: Pay attention to any changes in your thyroid nodule or nodules. Take over-the-counter  and prescription medicines only as told by your health care provider. Keep all follow-up visits. This is important. Contact a health care provider if: You have trouble sleeping. You have muscle weakness. You have  significant weight loss without changing your eating habits. You feel nervous. You have trouble swallowing. You have increased swelling. You have a rapid or irregular heartbeat. Get help right away if: You have chest pain. You faint or lose consciousness. Your nodule makes it hard for you to breathe. These symptoms may be an emergency. Get help right away. Call 911. Do not wait to see if the symptoms will go away. Do not drive yourself to the hospital. Summary A thyroid nodule is an isolated growth of thyroid cells that forms a lump in your thyroid gland. Thyroid nodules are common. Most are not cancerous. Your health care provider may monitor the nodule to see if it goes away without treatment. If a nodule continues to grow, is cancerous, or does not go away, treatment may be needed. Treatment depends on the cause and size of your nodule or nodules. This information is not intended to replace advice given to you by your health care provider. Make sure you discuss any questions you have with your health care provider. Document Revised: 08/27/2021 Document Reviewed: 08/27/2021 [employer] Patient Education  2024 [employer].

## 2024-11-24 ENCOUNTER — Encounter: Admitting: Nurse Practitioner

## 2024-11-24 DIAGNOSIS — E041 Nontoxic single thyroid nodule: Secondary | ICD-10-CM

## 2024-11-24 NOTE — Progress Notes (Unsigned)
 Erroneous encounter

## 2024-12-23 ENCOUNTER — Encounter: Admitting: Nurse Practitioner

## 2025-01-18 ENCOUNTER — Ambulatory Visit: Admitting: Nurse Practitioner
